# Patient Record
Sex: Male | Born: 1945
Health system: Southern US, Community
[De-identification: ages and names within clinical notes are randomized; demographics above are authoritative.]

## PROBLEM LIST (undated history)

## (undated) DIAGNOSIS — Z87442 Personal history of urinary calculi: Secondary | ICD-10-CM

## (undated) DIAGNOSIS — Z9641 Presence of insulin pump (external) (internal): Secondary | ICD-10-CM

## (undated) DIAGNOSIS — K219 Gastro-esophageal reflux disease without esophagitis: Secondary | ICD-10-CM

## (undated) DIAGNOSIS — E162 Hypoglycemia, unspecified: Secondary | ICD-10-CM

## (undated) DIAGNOSIS — I219 Acute myocardial infarction, unspecified: Secondary | ICD-10-CM

## (undated) DIAGNOSIS — K5909 Other constipation: Secondary | ICD-10-CM

## (undated) DIAGNOSIS — Z85828 Personal history of other malignant neoplasm of skin: Secondary | ICD-10-CM

## (undated) DIAGNOSIS — M1990 Unspecified osteoarthritis, unspecified site: Secondary | ICD-10-CM

## (undated) DIAGNOSIS — R609 Edema, unspecified: Secondary | ICD-10-CM

## (undated) DIAGNOSIS — I10 Essential (primary) hypertension: Secondary | ICD-10-CM

## (undated) DIAGNOSIS — R600 Localized edema: Secondary | ICD-10-CM

## (undated) DIAGNOSIS — N35919 Unspecified urethral stricture, male, unspecified site: Secondary | ICD-10-CM

## (undated) DIAGNOSIS — E133299 Other specified diabetes mellitus with mild nonproliferative diabetic retinopathy without macular edema, unspecified eye: Secondary | ICD-10-CM

## (undated) DIAGNOSIS — I2510 Atherosclerotic heart disease of native coronary artery without angina pectoris: Secondary | ICD-10-CM

## (undated) DIAGNOSIS — D649 Anemia, unspecified: Secondary | ICD-10-CM

## (undated) DIAGNOSIS — M549 Dorsalgia, unspecified: Secondary | ICD-10-CM

## (undated) DIAGNOSIS — I739 Peripheral vascular disease, unspecified: Secondary | ICD-10-CM

## (undated) DIAGNOSIS — E785 Hyperlipidemia, unspecified: Secondary | ICD-10-CM

## (undated) DIAGNOSIS — C801 Malignant (primary) neoplasm, unspecified: Secondary | ICD-10-CM

## (undated) DIAGNOSIS — Z8639 Personal history of other endocrine, nutritional and metabolic disease: Secondary | ICD-10-CM

## (undated) DIAGNOSIS — S56119A Strain of flexor muscle, fascia and tendon of finger of unspecified finger at forearm level, initial encounter: Secondary | ICD-10-CM

## (undated) DIAGNOSIS — I255 Ischemic cardiomyopathy: Secondary | ICD-10-CM

## (undated) DIAGNOSIS — E109 Type 1 diabetes mellitus without complications: Secondary | ICD-10-CM

## (undated) DIAGNOSIS — Z86718 Personal history of other venous thrombosis and embolism: Secondary | ICD-10-CM

## (undated) HISTORY — DX: Acute myocardial infarction, unspecified: I21.9

## (undated) HISTORY — DX: Hypoglycemia, unspecified: E16.2

## (undated) HISTORY — DX: Edema, unspecified: R60.9

## (undated) HISTORY — DX: Essential (primary) hypertension: I10

## (undated) HISTORY — DX: Ischemic cardiomyopathy: I25.5

## (undated) HISTORY — PX: CORONARY STENT PLACEMENT: SHX1402

## (undated) HISTORY — DX: Atherosclerotic heart disease of native coronary artery without angina pectoris: I25.10

## (undated) HISTORY — PX: SKIN CANCER EXCISION: SHX779

## (undated) HISTORY — DX: Unspecified osteoarthritis, unspecified site: M19.90

## (undated) HISTORY — PX: CORONARY ANGIOPLASTY: SHX604

## (undated) HISTORY — PX: CARDIAC CATHETERIZATION: SHX172

## (undated) HISTORY — DX: Malignant (primary) neoplasm, unspecified: C80.1

## (undated) HISTORY — DX: Hyperlipidemia, unspecified: E78.5

## (undated) SURGERY — LESION EXCISION WITH COMPLEX REPAIR
Anesthesia: Monitor Anesthesia Care | Laterality: Right

---

## 2005-08-11 ENCOUNTER — Emergency Department: Payer: Self-pay | Admitting: Emergency Medicine

## 2005-08-14 ENCOUNTER — Emergency Department: Payer: Self-pay | Admitting: Emergency Medicine

## 2005-08-19 ENCOUNTER — Emergency Department: Payer: Self-pay | Admitting: Internal Medicine

## 2005-08-22 ENCOUNTER — Emergency Department: Payer: Self-pay | Admitting: Emergency Medicine

## 2005-11-11 DIAGNOSIS — I219 Acute myocardial infarction, unspecified: Secondary | ICD-10-CM

## 2005-11-11 HISTORY — DX: Acute myocardial infarction, unspecified: I21.9

## 2005-12-17 ENCOUNTER — Encounter: Payer: Self-pay | Admitting: Cardiovascular Disease

## 2006-12-12 DIAGNOSIS — I255 Ischemic cardiomyopathy: Secondary | ICD-10-CM

## 2006-12-12 DIAGNOSIS — I252 Old myocardial infarction: Secondary | ICD-10-CM

## 2006-12-12 HISTORY — DX: Ischemic cardiomyopathy: I25.5

## 2006-12-12 HISTORY — DX: Old myocardial infarction: I25.2

## 2006-12-17 ENCOUNTER — Other Ambulatory Visit: Payer: Self-pay

## 2006-12-17 ENCOUNTER — Inpatient Hospital Stay: Payer: Self-pay | Admitting: *Deleted

## 2006-12-17 DIAGNOSIS — Z955 Presence of coronary angioplasty implant and graft: Secondary | ICD-10-CM

## 2006-12-17 HISTORY — DX: Presence of coronary angioplasty implant and graft: Z95.5

## 2006-12-17 HISTORY — PX: CORONARY ANGIOPLASTY WITH STENT PLACEMENT: SHX49

## 2006-12-19 ENCOUNTER — Other Ambulatory Visit: Payer: Self-pay

## 2007-02-14 ENCOUNTER — Inpatient Hospital Stay: Payer: Self-pay | Admitting: Internal Medicine

## 2007-02-14 ENCOUNTER — Other Ambulatory Visit: Payer: Self-pay

## 2007-03-04 ENCOUNTER — Encounter: Payer: Self-pay | Admitting: *Deleted

## 2007-07-22 ENCOUNTER — Encounter: Payer: Self-pay | Admitting: Cardiovascular Disease

## 2007-08-30 ENCOUNTER — Emergency Department: Payer: Self-pay | Admitting: Emergency Medicine

## 2007-09-11 ENCOUNTER — Emergency Department: Payer: Self-pay | Admitting: Emergency Medicine

## 2007-09-11 ENCOUNTER — Other Ambulatory Visit: Payer: Self-pay

## 2008-07-22 ENCOUNTER — Encounter: Payer: Self-pay | Admitting: Cardiovascular Disease

## 2009-06-02 ENCOUNTER — Encounter: Payer: Self-pay | Admitting: Cardiovascular Disease

## 2009-10-22 ENCOUNTER — Emergency Department: Payer: Self-pay | Admitting: Unknown Physician Specialty

## 2009-11-29 ENCOUNTER — Encounter: Payer: Self-pay | Admitting: Cardiovascular Disease

## 2010-05-11 ENCOUNTER — Observation Stay: Payer: Self-pay | Admitting: Internal Medicine

## 2010-12-20 ENCOUNTER — Telehealth: Payer: Self-pay | Admitting: Cardiovascular Disease

## 2010-12-25 ENCOUNTER — Encounter: Payer: Self-pay | Admitting: Cardiovascular Disease

## 2010-12-27 ENCOUNTER — Ambulatory Visit (INDEPENDENT_AMBULATORY_CARE_PROVIDER_SITE_OTHER): Payer: BC Managed Care – PPO | Admitting: Cardiovascular Disease

## 2010-12-27 ENCOUNTER — Encounter: Payer: Self-pay | Admitting: Cardiovascular Disease

## 2010-12-27 DIAGNOSIS — I2510 Atherosclerotic heart disease of native coronary artery without angina pectoris: Secondary | ICD-10-CM | POA: Insufficient documentation

## 2010-12-27 DIAGNOSIS — E119 Type 2 diabetes mellitus without complications: Secondary | ICD-10-CM | POA: Insufficient documentation

## 2010-12-27 DIAGNOSIS — I10 Essential (primary) hypertension: Secondary | ICD-10-CM

## 2010-12-27 DIAGNOSIS — E109 Type 1 diabetes mellitus without complications: Secondary | ICD-10-CM | POA: Insufficient documentation

## 2010-12-27 DIAGNOSIS — E785 Hyperlipidemia, unspecified: Secondary | ICD-10-CM | POA: Insufficient documentation

## 2010-12-27 DIAGNOSIS — R609 Edema, unspecified: Secondary | ICD-10-CM | POA: Insufficient documentation

## 2010-12-27 NOTE — Progress Notes (Signed)
Summary: RX  Phone Note Refill Request Call back at Home Phone [phone] Call back at [phone] Message from:  Patient on December 20, 2010 10:09 AM  Refills Requested: Medication #1:  [facility]  Initial call taken by: Harlon Flor,  December 20, 2010 10:10 AM  Follow-up for Phone Call        [facility]. Follow-up by: Lysbeth Galas CMA,  December 20, 2010 10:56 AM    New/Updated Medications: PLAVIX 75 MG TABS (CLOPIDOGREL BISULFATE) 1 tablet once daily Prescriptions: PLAVIX 75 MG TABS (CLOPIDOGREL BISULFATE) 1 tablet once daily  #30 x 0   Entered by:   Lysbeth Galas CMA   Authorized by:   Dossie Arbour MD   Signed by:   Lysbeth Galas CMA on 12/20/2010   Method used:   Electronically to        [facility] [phone].* (retail)       [address]       [place], [place]  [address]       Ph: [phone]       Fax: [phone]   RxID:   [id number]

## 2011-01-02 NOTE — Assessment & Plan Note (Signed)
Summary: [facility]   Visit Type:  Initial Consult Primary Provider:  Dr. Delia Chimes  CC:  "doing well" denies chest pain and SOB.Marland Kitchen  History of Present Illness: [name] is a very pleasant 65 year old gentleman with a history of coronary artery disease, occluded LAD with stent placed February 2008 ( Cypher 3.0 x 8 mm DES stent), also with poorly controlled diabetes, hypertension who presents to reestablish care. He was last seen by myself at [facility] in January 2011.  He reports that overall he is doing well. He presents today for refill of his medications. He is active on a horse farm. He is thinking of going back to work part-time. He denies any chest pain or shortness of breath with exertion. His previous angina was described as posterior neck pain. He denies any neck pain with exertion.  He has been working with Dr. Hyacinth Meeker on his diabetes and reports a hemoglobin A1c of 8 which is improved from 10 in February of 2010. He does report having 2 episodes of hypoglycemia overnight that scared him and has been reluctant to have his sugars have been aggressively low level.  EKG shows normal sinus rhythm with rate 68 beats per minute old anterior infarct, left axis deviation  Cardiac catheter report from 2008 details 25% ostial left main, 100% mid LAD with stent placed at this location,40% proximal circumflex, 20% PL vessel disease  Preventive Screening-Counseling & Management  Alcohol-Tobacco     Smoking Status: never  Caffeine-Diet-Exercise     Does Patient Exercise: yes      Drug Use:  no.    Current Medications (verified): 1)  Plavix 75 Mg Tabs (Clopidogrel Bisulfate) .Marland Kitchen.. 1 Tablet Once Daily 2)  Ecotrin 325 Mg Tbec (Aspirin) .Marland Kitchen.. 1 Tablet Once Daily 3)  Coreg 25 Mg Tabs (Carvedilol) .Marland Kitchen.. 1 Tablet Two Times A Day 4)  Lipitor 80 Mg Tabs (Atorvastatin Calcium) .Marland Kitchen.. 1 Tablet At Bedtime 5)  Nitrostat 0.4 Mg Subl (Nitroglycerin) .... As Needed  For Chest Pains 6)  Furosemide 20 Mg Tabs (Furosemide) .Marland Kitchen.. 1 Tablet Once Daily 7)  Lantus 100 Unit/ml Soln (Insulin Glargine) .Marland Kitchen.. 15 U Two Times A Day As Needed 8)  Novolog 100 Unit/ml Soln (Insulin Aspart) .... 5 U Two Times A Day As Needed 9)  Multivitamins  Tabs (Multiple Vitamin) .Marland Kitchen.. 1 Tablet Once Daily 10)  Vitamin B-12 1000 Mcg Tabs (Cyanocobalamin) .Marland Kitchen.. 1 Tablet Once Daily 11)  Losartan Potassium 50 Mg Tabs (Losartan Potassium) .Marland Kitchen.. 1 Tablet Once Daily  Allergies (verified): No Known Drug Allergies  Past History:  Past Medical History: Last updated: 12/26/2010 CAD h/o ischemic cardiomyopathy Diabetes Hyperlipidemia Hypertension h/o MI  Past Surgical History: Last updated: 12/26/2010 Cardiac cath-stent placed to an occluded LAD  Family History: Last updated: 12/28/10 Father:deceased-heart disease Mother:Heart Disease-59-deceased-MI  Social History: Last updated: 2010-12-28 Single  Tobacco Use - No.  Alcohol Use - no Regular Exercise - yes Drug Use - no Retired   Risk Factors: Exercise: yes (December 28, 2010)  Risk Factors: Smoking Status: never (Dec 28, 2010)  Family History: Father:deceased-heart disease Mother:Heart Disease-59-deceased-MI  Social History: Single  Tobacco Use - No.  Alcohol Use - no Regular Exercise - yes Drug Use - no Retired  Smoking Status:  never Does Patient Exercise:  yes Drug Use:  no  Review of Systems  The patient denies fever, weight loss, weight gain, vision loss, decreased hearing, hoarseness, chest pain, syncope, dyspnea on exertion, peripheral edema, prolonged cough, abdominal pain, incontinence, muscle weakness, depression,  and enlarged lymph nodes.    Vital Signs:  Patient profile:   65 year old male Height:      69 inches Weight:      190.50 pounds BMI:     28.23 Pulse rate:   68 / minute BP sitting:   135 / 81  (left arm) Cuff size:   regular  Vitals Entered By: Lysbeth Galas CMA (December 27, 2010 11:23  AM)  Physical Exam  General:  Well developed, well nourished, in no acute distress. Head:  normocephalic and atraumatic Neck:  Neck supple, no JVD. No masses, thyromegaly or abnormal cervical nodes. Lungs:  Clear bilaterally to auscultation and percussion. Heart:  Non-displaced PMI, chest non-tender; regular rate and rhythm, S1, S2 without murmurs, rubs or gallops. Carotid upstroke normal, no bruit. Normal abdominal aortic size, no bruits.  Pedals normal pulses. 1+ edema b/l LE, no varicosities. Abdomen:  Bowel sounds positive; abdomen soft and non-tender without masses Msk:  Back normal, normal gait. Muscle strength and tone normal. Pulses:  pulses normal in all 4 extremities Extremities:  No clubbing or cyanosis. Neurologic:  Alert and oriented x 3. Skin:  Intact without lesions or rashes. Psych:  Normal affect.   Impression & Recommendations:  Problem # 1:  CAD, NATIVE VESSEL (ICD-414.01) known CAD with stent placed to his mid LAD in 2008. No symptoms of angina at this time. Previous negative stress test in 2009. Continue aggressive medical management. No further testing ordered.  His updated medication list for this problem includes:    Plavix 75 Mg Tabs (Clopidogrel bisulfate) .Marland Kitchen... 1 tablet once daily    Ecotrin 325 Mg Tbec (Aspirin) .Marland Kitchen... 1 tablet once daily    Coreg 25 Mg Tabs (Carvedilol) .Marland Kitchen... 1 tablet two times a day    Nitrostat 0.4 Mg Subl (Nitroglycerin) .Marland Kitchen... As needed for chest pains  Problem # 2:  HYPERLIPIDEMIA-MIXED (ICD-272.4) We will have him check his labs in the next week or so. Goal LDL is less than 70.  His updated medication list for this problem includes:    Lipitor 80 Mg Tabs (Atorvastatin calcium) .Marland Kitchen... 1 tablet at bedtime  Problem # 3:  DM (ICD-250.00) We had a long discussion with him about his diabetes. It has improved though continues to be far from perfect. He will continue to work with Dr. Hyacinth Meeker on optimizing his sugar control. At his  request, we have ordered a hemoglobin A1c to be done this week which we will send to Dr. Hyacinth Meeker.  His updated medication list for this problem includes:    Ecotrin 325 Mg Tbec (Aspirin) .Marland Kitchen... 1 tablet once daily    Lantus 100 Unit/ml Soln (Insulin glargine) .Marland KitchenMarland KitchenMarland KitchenMarland Kitchen 15 u two times a day as needed    Novolog 100 Unit/ml Soln (Insulin aspart) .Marland KitchenMarland KitchenMarland KitchenMarland Kitchen 5 u two times a day as needed    Losartan Potassium 50 Mg Tabs (Losartan potassium) .Marland Kitchen... 1 tablet once daily  Problem # 4:  HYPERTENSION, BENIGN (ICD-401.1) Blood pressure is relatively well controlled on his current medication regimen.  His updated medication list for this problem includes:    Ecotrin 325 Mg Tbec (Aspirin) .Marland Kitchen... 1 tablet once daily    Coreg 25 Mg Tabs (Carvedilol) .Marland Kitchen... 1 tablet two times a day    Furosemide 20 Mg Tabs (Furosemide) .Marland Kitchen... 1 tablet once daily    Losartan Potassium 50 Mg Tabs (Losartan potassium) .Marland Kitchen... 1 tablet once daily  Problem # 5:  EDEMA (ICD-782.3) He does have edema in his lower  extremity is bilaterally. This is likely venous insufficiency as he has been sitting at a computer for long periods at a time. It is not particularly pitting consistent with CHF. He does take Lasix daily. He also admits to recent salt loading.  Other Orders: EKG w/ Interpretation (93000)  Patient Instructions: 1)  Your physician recommends that you schedule a follow-up appointment in: 1 year 2)  Your physician recommends that you return for a FASTING lipid profile: (HgB A1c, BMP, Lipids, Lfts)You have an appt next Thursday 01/03/11 AM. 3)  Your physician recommends that you continue on your current medications as directed. Please refer to the Current Medication list given to you today. Prescriptions: LOSARTAN POTASSIUM 50 MG TABS (LOSARTAN POTASSIUM) 1 tablet once daily  #90 x 4   Entered by:   [name] Hurst RN   Authorized by:   Dossie Arbour MD   Signed by:   [name] Hurst RN on 12/27/2010   Method used:   Electronically to         [facility] [address] [phone].* (retail)       [address]       [place], [place]  [address]       Ph: [phone]       Fax: [phone]   RxID:   [id number] FUROSEMIDE 20 MG TABS (FUROSEMIDE) 1 tablet once daily  #90 x 4   Entered by:   [name] Hurst RN   Authorized by:   Dossie Arbour MD   Signed by:   [name] Hurst RN on 12/27/2010   Method used:   Electronically to        [facility] [address] [phone].* (retail)       [address]       [place], [place]  [address]       Ph: [phone]       Fax: [phone]   RxID:   [id number] LIPITOR 80 MG TABS (ATORVASTATIN CALCIUM) 1 tablet at bedtime  #90 x 4   Entered by:   [name] Hurst RN   Authorized by:   Dossie Arbour MD   Signed by:   [name] Hurst RN on 12/27/2010   Method used:   Electronically to        [facility] [address] [phone].* (retail)       [address]       [place], [place]  [address]       Ph: [phone]       Fax: [phone]   RxID:   [id number] COREG 25 MG TABS (CARVEDILOL) 1 tablet two times a day  #180 x 4   Entered by:   [name] Hurst RN   Authorized by:   Dossie Arbour MD   Signed by:   [name] Hurst RN on 12/27/2010   Method used:   Electronically to        [facility] [address] [phone].* (retail)       [address]       [place], [place]  [address]       Ph: [phone]       Fax: [phone]   RxID:   [id number] PLAVIX 75 MG TABS (CLOPIDOGREL BISULFATE) 1 tablet once daily  #90 x 4   Entered  by:   [name] Hurst RN   Authorized by:   Dossie Arbour MD   Signed by:   [name] Hurst RN on 12/27/2010   Method used:   Electronically to        [facility] [address] [phone].* (retail)       [address]       [place], [place]  [address]       Ph: [phone]       Fax: [phone]   RxID:   [id number]

## 2011-01-03 ENCOUNTER — Other Ambulatory Visit (INDEPENDENT_AMBULATORY_CARE_PROVIDER_SITE_OTHER): Payer: BC Managed Care – PPO

## 2011-01-03 ENCOUNTER — Encounter: Payer: Self-pay | Admitting: Cardiovascular Disease

## 2011-01-03 DIAGNOSIS — E785 Hyperlipidemia, unspecified: Secondary | ICD-10-CM

## 2011-01-03 DIAGNOSIS — E119 Type 2 diabetes mellitus without complications: Secondary | ICD-10-CM

## 2011-01-03 DIAGNOSIS — Z79899 Other long term (current) drug therapy: Secondary | ICD-10-CM

## 2011-01-05 LAB — CONVERTED CEMR LAB
AST: 22 units/L (ref 0–37)
Albumin: 3.7 g/dL (ref 3.5–5.2)
CO2: 27 meq/L (ref 19–32)
Calcium: 9.1 mg/dL (ref 8.4–10.5)
Chloride: 104 meq/L (ref 96–112)
HDL: 53 mg/dL (ref >39)
Hgb A1c MFr Bld: 8.7 % — ABNORMAL HIGH (ref <5.7)
Sodium: 140 meq/L (ref 135–145)
Total Bilirubin: 0.7 mg/dL (ref 0.3–1.2)
Total CHOL/HDL Ratio: 2.8

## 2011-01-22 NOTE — Letter (Signed)
Summary: [facility] 2-D Echo  [facility] 2-D Echo   Imported By: Roderic Ovens 01/18/2011 11:03:06  _____________________________________________________________________  External Attachment:    Type:   Image     Comment:   External Document

## 2011-01-22 NOTE — Letter (Signed)
SummaryScientist, [facility] Cath Report   [facility] Cath Report   Imported By: Roderic Ovens 01/17/2011 15:45:03  _____________________________________________________________________  External Attachment:    Type:   Image     Comment:   External Document

## 2011-01-22 NOTE — Letter (Signed)
Summary: [facility] Office Note   [facility] Office Note   Imported By: Roderic Ovens 01/18/2011 11:05:16  _____________________________________________________________________  External Attachment:    Type:   Image     Comment:   External Document

## 2011-01-22 NOTE — Letter (Signed)
Summary: [facility] Office Note   [facility] Office Note   Imported By: Roderic Ovens 01/18/2011 11:04:53  _____________________________________________________________________  External Attachment:    Type:   Image     Comment:   External Document

## 2011-05-30 ENCOUNTER — Encounter: Payer: Self-pay | Admitting: Cardiovascular Disease

## 2011-12-30 ENCOUNTER — Other Ambulatory Visit: Payer: Self-pay | Admitting: *Deleted

## 2011-12-30 MED ORDER — ATORVASTATIN CALCIUM 80 MG PO TABS: 80.0000 mg | ORAL_TABLET | Freq: Every day | ORAL | Status: DC

## 2011-12-30 MED ORDER — CARVEDILOL 25 MG PO TABS: 25.0000 mg | ORAL_TABLET | Freq: Two times a day (BID) | ORAL | Status: DC

## 2011-12-30 MED ORDER — LOSARTAN POTASSIUM 50 MG PO TABS: 50.0000 mg | ORAL_TABLET | Freq: Every day | ORAL | Status: DC

## 2011-12-30 MED ORDER — CLOPIDOGREL BISULFATE 75 MG PO TABS: 75.0000 mg | ORAL_TABLET | Freq: Every day | ORAL | Status: DC

## 2011-12-30 MED ORDER — FUROSEMIDE 20 MG PO TABS: 20.0000 mg | ORAL_TABLET | Freq: Every day | ORAL | Status: DC

## 2012-01-01 ENCOUNTER — Encounter: Payer: Self-pay | Admitting: *Deleted

## 2012-01-02 ENCOUNTER — Ambulatory Visit (INDEPENDENT_AMBULATORY_CARE_PROVIDER_SITE_OTHER): Payer: PRIVATE HEALTH INSURANCE | Admitting: Cardiovascular Disease

## 2012-01-02 ENCOUNTER — Encounter: Payer: Self-pay | Admitting: Cardiovascular Disease

## 2012-01-02 VITALS — BP 110/62 | HR 76 | Ht 69.0 in | Wt 199.0 lb

## 2012-01-02 DIAGNOSIS — E785 Hyperlipidemia, unspecified: Secondary | ICD-10-CM

## 2012-01-02 DIAGNOSIS — I10 Essential (primary) hypertension: Secondary | ICD-10-CM

## 2012-01-02 DIAGNOSIS — I2510 Atherosclerotic heart disease of native coronary artery without angina pectoris: Secondary | ICD-10-CM

## 2012-01-02 DIAGNOSIS — E119 Type 2 diabetes mellitus without complications: Secondary | ICD-10-CM

## 2012-01-02 DIAGNOSIS — R61 Generalized hyperhidrosis: Secondary | ICD-10-CM

## 2012-01-02 NOTE — Patient Instructions (Addendum)
Please hold the losartan and lipitor to see if this will help the night sweats.  Please call us if you have new issues that need to be addressed before your next appt.  Your physician wants you to follow-up in: 6 months.  You will receive a reminder letter in the mail two months in advance. If you don't receive a letter, please call our office to schedule the follow-up appointment.

## 2012-01-02 NOTE — Assessment & Plan Note (Signed)
Currently with no symptoms of angina. No further workup at this time. Continue current medication regimen. 

## 2012-01-02 NOTE — Assessment & Plan Note (Signed)
Followed by Dr. Burnett Sheng and Silver Huguenin.

## 2012-01-02 NOTE — Assessment & Plan Note (Signed)
Goal total cholesterol less than 150, LDL less than 70 

## 2012-01-02 NOTE — Progress Notes (Signed)
Patient ID: [name], male    DOB: June 11, 1946, 66 y.o.   MRN: [id number]  HPI Comments: [name] is a very pleasant 66 year old gentleman with a history of coronary artery disease, occluded LAD with stent placed February 2008 ( Cypher 3.0 x 8 mm DES stent), also with poorly controlled diabetes, hypertension who presents for routine followup.     He is active on a horse farm.  He denies any chest pain or shortness of breath with exertion. His previous angina was described as posterior neck pain. He denies any neck pain with exertion. He has been having sweating at nighttime. It wakes him up at 3:00 in the morning and he is soaked in sweat. He has developed a rash across his anterior trunk him a little on his back which she feels could be secondary to the sweat. During the daytime he feels well and is active with no complaints. He wonders if the sweating could be secondary to the medications. He did start the generic Lipitor approximately 6 weeks ago when his symptoms started. He did have his knee injected with steroid 4 weeks ago but symptoms of sweating started prior to this.   EKG shows normal sinus rhythm with rate 76 beats per minute old anterior infarct, left axis deviation   Cardiac catheter report from 2008 details 25% ostial left main, 100% mid LAD with stent placed at this location,40% proximal circumflex, 20% PL vessel disease    Outpatient Encounter Prescriptions as of 01/02/2012  Medication Sig Dispense Refill  . aspirin 325 MG EC tablet Take 325 mg by mouth daily.        Marland Kitchen atorvastatin (LIPITOR) 80 MG tablet Take 1 tablet (80 mg total) by mouth at bedtime.  90 tablet  1  . carvedilol (COREG) 25 MG tablet Take 1 tablet (25 mg total) by mouth 2 (two) times daily.  180 tablet  1  . clopidogrel (PLAVIX) 75 MG tablet Take 1 tablet (75 mg total) by mouth daily.  90 tablet  1  . furosemide (LASIX) 20 MG tablet Take 1 tablet (20 mg total) by mouth daily.  90 tablet  1  .  insulin aspart (NOVOLOG) 100 UNIT/ML injection Inject 5 Units into the skin 2 (two) times daily as needed.        . insulin glargine (LANTUS) 100 UNIT/ML injection Inject 15 Units into the skin 2 (two) times daily as needed.        Marland Kitchen losartan (COZAAR) 50 MG tablet Take 1 tablet (50 mg total) by mouth daily.  90 tablet  1  . multivitamin (THERAGRAN) per tablet Take 1 tablet by mouth daily.        . nitroGLYCERIN (NITROSTAT) 0.4 MG SL tablet Place 0.4 mg under the tongue every 5 (five) minutes as needed. Do not exceed 3 doses in 15 minutes.       . vitamin B-12 (CYANOCOBALAMIN) 1000 MCG tablet Take 1,000 mcg by mouth daily.           Review of Systems  Constitutional: Negative.        Diaphoresis/sweating in the middle of the night  HENT: Negative.   Eyes: Negative.   Respiratory: Negative.   Cardiovascular: Negative.   Gastrointestinal: Negative.   Musculoskeletal: Negative.   Skin: Negative.   Neurological: Negative.   Hematological: Negative.   Psychiatric/Behavioral: Negative.   All other systems reviewed and are negative.    BP 110/62  Pulse 76  Ht 5\' 9"  (1.753 m)  Wt 199 lb (90.266 kg)  BMI 29.39 kg/m2   Physical Exam  Nursing note and vitals reviewed. Constitutional: He is oriented to person, place, and time. He appears well-developed and well-nourished.  HENT:  Head: Normocephalic.  Nose: Nose normal.  Mouth/Throat: Oropharynx is clear and moist.  Eyes: Conjunctivae are normal. Pupils are equal, round, and reactive to light.  Neck: Normal range of motion. Neck supple. No JVD present.  Cardiovascular: Normal rate, regular rhythm, S1 normal, S2 normal, normal heart sounds and intact distal pulses.  Exam reveals no gallop and no friction rub.   No murmur heard. Pulmonary/Chest: Effort normal and breath sounds normal. No respiratory distress. He has no wheezes. He has no rales. He exhibits no tenderness.  Abdominal: Soft. Bowel sounds are normal. He exhibits no  distension. There is no tenderness.  Musculoskeletal: Normal range of motion. He exhibits no edema and no tenderness.  Lymphadenopathy:    He has no cervical adenopathy.  Neurological: He is alert and oriented to person, place, and time. Coordination normal.  Skin: Skin is warm and dry. No rash noted. No erythema.  Psychiatric: He has a normal mood and affect. His behavior is normal. Judgment and thought content normal.           Assessment and Plan

## 2012-01-02 NOTE — Assessment & Plan Note (Signed)
He does report having low blood pressure at home. Given his recent diaphoresis in the middle of the night, we have suggested he hold his losartan to see if this helps his symptoms.

## 2012-01-02 NOTE — Assessment & Plan Note (Signed)
Etiology of his diaphoresis at nighttime while he is sleeping is uncertain. He denies having low blood glucose. He is concerned about low blood pressure. We will hold his losartan for now. He is also concerned about his new generic cholesterol medication. We have suggested he hold the atrial and a statin to see if this helps his symptoms. He'll followup with his primary care physician for other lab work. Uncertain if he needs a TSH, testosterone check, possibly even cortisol. I will defer to Dr. Burnett Sheng.

## 2012-07-01 ENCOUNTER — Ambulatory Visit (INDEPENDENT_AMBULATORY_CARE_PROVIDER_SITE_OTHER): Payer: PRIVATE HEALTH INSURANCE | Admitting: Cardiovascular Disease

## 2012-07-01 ENCOUNTER — Encounter: Payer: Self-pay | Admitting: Cardiovascular Disease

## 2012-07-01 VITALS — BP 100/64 | HR 78 | Ht 69.0 in | Wt 191.5 lb

## 2012-07-01 DIAGNOSIS — E785 Hyperlipidemia, unspecified: Secondary | ICD-10-CM

## 2012-07-01 DIAGNOSIS — I2510 Atherosclerotic heart disease of native coronary artery without angina pectoris: Secondary | ICD-10-CM

## 2012-07-01 DIAGNOSIS — E119 Type 2 diabetes mellitus without complications: Secondary | ICD-10-CM

## 2012-07-01 DIAGNOSIS — I10 Essential (primary) hypertension: Secondary | ICD-10-CM

## 2012-07-01 DIAGNOSIS — R61 Generalized hyperhidrosis: Secondary | ICD-10-CM

## 2012-07-01 NOTE — Patient Instructions (Addendum)
You are doing well. No medication changes were made.  Please call us if you have new issues that need to be addressed before your next appt.  Your physician wants you to follow-up in: 12 months.  You will receive a reminder letter in the mail two months in advance. If you don't receive a letter, please call our office to schedule the follow-up appointment. 

## 2012-07-01 NOTE — Assessment & Plan Note (Signed)
Diabetes is followed by Dr. Rockie Neighbours

## 2012-07-01 NOTE — Assessment & Plan Note (Signed)
Blood pressure is well controlled on today's visit. No changes made to the medications. 

## 2012-07-01 NOTE — Assessment & Plan Note (Signed)
Cholesterol is at goal on the current lipid regimen. No changes to the medications were made.  

## 2012-07-01 NOTE — Assessment & Plan Note (Signed)
Night sweats have resolved as he has done his dental work indicating possible low-grade infection.

## 2012-07-01 NOTE — Assessment & Plan Note (Signed)
Currently with no symptoms of angina. No further workup at this time. Continue current medication regimen. 

## 2012-07-01 NOTE — Progress Notes (Signed)
Patient ID: [name], male    DOB: [birth year], 66 y.o.   MRN: [id number]  HPI Comments: [name] is a very pleasant 66 year old gentleman with a history of coronary artery disease, occluded LAD with stent placed February 2008 ( Cypher 3.0 x 8 mm DES stent), also with poorly controlled diabetes, hypertension who presents for routine followup.     He is active on a horse farm.  He denies any chest pain or shortness of breath with exertion. His previous angina was described as posterior neck pain. He denies any neck pain with exertion. He does report having some tingling down his left arm.   On his last clinic visit he reported having night sweats. We held his carvedilol and Lipitor. Night sweats did not improve. He restarted the medications.  He has since been seeing a dentist and was told he had infection in his gums and has had significant work done. He feels the night sweats have improved as his dental situation has improved.  He does report an episode where his blood sugar dropped. 911 was called and sugar level was found to be 20. In followup with Dr. Hyacinth Meeker, his insulin level was decreased.    EKG shows normal sinus rhythm with rate 78 beats per minute old anterior infarct   Cardiac catheter report from 2008 details 25% ostial left main, 100% mid LAD with stent placed at this location,40% proximal circumflex, 20% PL vessel disease  Previous stress test showing scar in the mid to distal anterior wall.    Outpatient Encounter Prescriptions as of 07/01/2012  Medication Sig Dispense Refill  . aspirin 325 MG EC tablet Take 325 mg by mouth daily.        Marland Kitchen atorvastatin (LIPITOR) 80 MG tablet Take 1 tablet (80 mg total) by mouth at bedtime.  90 tablet  1  . carvedilol (COREG) 25 MG tablet Take 1 tablet (25 mg total) by mouth 2 (two) times daily.  180 tablet  1  . clopidogrel (PLAVIX) 75 MG tablet Take 1 tablet (75 mg total) by mouth daily.  90 tablet  1  . furosemide (LASIX) 20  MG tablet Take 1 tablet (20 mg total) by mouth daily.  90 tablet  1  . insulin aspart (NOVOLOG) 100 UNIT/ML injection Inject 2 Units into the skin 2 (two) times daily as needed.       . insulin glargine (LANTUS) 100 UNIT/ML injection Takes 10 units am 14 units pm.      . losartan (COZAAR) 50 MG tablet Take 1 tablet (50 mg total) by mouth daily.  90 tablet  1  . multivitamin (THERAGRAN) per tablet Take 1 tablet by mouth daily.        . nitroGLYCERIN (NITROSTAT) 0.4 MG SL tablet Place 0.4 mg under the tongue every 5 (five) minutes as needed. Do not exceed 3 doses in 15 minutes.       . vitamin B-12 (CYANOCOBALAMIN) 1000 MCG tablet Take 1,000 mcg by mouth daily.           Review of Systems  Constitutional: Negative.   HENT: Negative.   Eyes: Negative.   Respiratory: Negative.   Cardiovascular: Negative.   Gastrointestinal: Negative.   Musculoskeletal: Negative.        Left arm tingling  Skin: Negative.   Neurological: Negative.   Hematological: Negative.   Psychiatric/Behavioral: Negative.   All other systems reviewed and are negative.    BP 100/64  Pulse 78  Ht 5\' 9"  (  1.753 m)  Wt 191 lb 8 oz (86.864 kg)  BMI 28.28 kg/m2  Physical Exam  Nursing note and vitals reviewed. Constitutional: He is oriented to person, place, and time. He appears well-developed and well-nourished.  HENT:  Head: Normocephalic.  Nose: Nose normal.  Mouth/Throat: Oropharynx is clear and moist.  Eyes: Conjunctivae are normal. Pupils are equal, round, and reactive to light.  Neck: Normal range of motion. Neck supple. No JVD present.  Cardiovascular: Normal rate, regular rhythm, S1 normal, S2 normal, normal heart sounds and intact distal pulses.  Exam reveals no gallop and no friction rub.   No murmur heard. Pulmonary/Chest: Effort normal and breath sounds normal. No respiratory distress. He has no wheezes. He has no rales. He exhibits no tenderness.  Abdominal: Soft. Bowel sounds are normal. He exhibits  no distension. There is no tenderness.  Musculoskeletal: Normal range of motion. He exhibits no edema and no tenderness.  Lymphadenopathy:    He has no cervical adenopathy.  Neurological: He is alert and oriented to person, place, and time. Coordination normal.  Skin: Skin is warm and dry. No rash noted. No erythema.  Psychiatric: He has a normal mood and affect. His behavior is normal. Judgment and thought content normal.           Assessment and Plan

## 2012-07-05 ENCOUNTER — Other Ambulatory Visit: Payer: Self-pay | Admitting: Cardiovascular Disease

## 2012-07-06 ENCOUNTER — Other Ambulatory Visit: Payer: Self-pay | Admitting: *Deleted

## 2012-07-06 MED ORDER — CARVEDILOL 25 MG PO TABS: 25.0000 mg | ORAL_TABLET | Freq: Two times a day (BID) | ORAL | Status: DC

## 2012-07-06 MED ORDER — CLOPIDOGREL BISULFATE 75 MG PO TABS: 75.0000 mg | ORAL_TABLET | Freq: Every day | ORAL | Status: DC

## 2012-07-06 MED ORDER — FUROSEMIDE 20 MG PO TABS: 20.0000 mg | ORAL_TABLET | Freq: Every day | ORAL | Status: DC

## 2012-07-06 NOTE — Telephone Encounter (Signed)
Refilled Lasix, Plavix and Coreg.

## 2012-09-07 ENCOUNTER — Other Ambulatory Visit: Payer: Self-pay | Admitting: Cardiovascular Disease

## 2013-01-12 ENCOUNTER — Other Ambulatory Visit: Payer: Self-pay | Admitting: Cardiovascular Disease

## 2013-01-14 ENCOUNTER — Other Ambulatory Visit: Payer: Self-pay | Admitting: Cardiovascular Disease

## 2013-04-24 ENCOUNTER — Other Ambulatory Visit: Payer: Self-pay | Admitting: Cardiovascular Disease

## 2013-04-26 ENCOUNTER — Other Ambulatory Visit: Payer: Self-pay | Admitting: *Deleted

## 2013-04-26 MED ORDER — ATORVASTATIN CALCIUM 80 MG PO TABS: ORAL_TABLET | ORAL | Status: DC

## 2013-04-26 NOTE — Telephone Encounter (Signed)
Refilled Atorvastatin sent to [facility] pharmacy. 

## 2013-06-27 ENCOUNTER — Other Ambulatory Visit: Payer: Self-pay | Admitting: Cardiovascular Disease

## 2013-07-02 ENCOUNTER — Encounter: Payer: Self-pay | Admitting: Cardiovascular Disease

## 2013-07-02 ENCOUNTER — Ambulatory Visit (INDEPENDENT_AMBULATORY_CARE_PROVIDER_SITE_OTHER): Payer: Medicare Other | Admitting: Cardiovascular Disease

## 2013-07-02 VITALS — BP 92/60 | HR 61 | Ht 69.0 in | Wt 193.5 lb

## 2013-07-02 DIAGNOSIS — I2510 Atherosclerotic heart disease of native coronary artery without angina pectoris: Secondary | ICD-10-CM

## 2013-07-02 DIAGNOSIS — R609 Edema, unspecified: Secondary | ICD-10-CM

## 2013-07-02 DIAGNOSIS — E785 Hyperlipidemia, unspecified: Secondary | ICD-10-CM

## 2013-07-02 DIAGNOSIS — E119 Type 2 diabetes mellitus without complications: Secondary | ICD-10-CM

## 2013-07-02 DIAGNOSIS — I10 Essential (primary) hypertension: Secondary | ICD-10-CM

## 2013-07-02 NOTE — Assessment & Plan Note (Signed)
Edema likely from lymphedema or venous insufficiency. Nonpitting, not consistent with CHF. Lasix likely will be of little benefit. May do better with compression hose and less Lasix.

## 2013-07-02 NOTE — Assessment & Plan Note (Signed)
Cholesterol is at goal on the current lipid regimen. No changes to the medications were made.  

## 2013-07-02 NOTE — Progress Notes (Signed)
Patient ID: [name], male    DOB: June 18, 1946, 67 y.o.   MRN: [id number]  HPI Comments: [name] is a very pleasant 67 year old gentleman with a history of coronary artery disease, occluded LAD with stent placed February 2008 ( Cypher 3.0 x 8 mm DES stent), also with poorly controlled diabetes, hypertension who presents for routine followup.     He is active on a horse farm.  He denies any chest pain or shortness of breath with exertion. His previous angina was described as posterior neck pain. He denies any neck pain with exertion. He continues to have tingling in his hands when he wakes up.  He does report having several episodes of dizziness, orthostatic in nature. Pressure has been running low. He has been holding his losartan. 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   On previous visits, he reported having night sweats. We held his carvedilol and Lipitor. Night sweats did not improve. He restarted the medications.  He has since been seeing a dentist and was told he had infection in his gums and has had significant work done. He feels the night sweats have improved as his dental situation has improved.  He does report an episode where his blood sugar dropped. 911 was called and sugar level was found to be 20. In followup with Dr. Hyacinth Meeker, his insulin level was decreased.    EKG shows normal sinus rhythm with rate 61 beats per minute old anterior infarct, T wave abnormality anterior leads   Cardiac catheter report from 2008 details 25% ostial left main, 100% mid LAD with stent placed at this location,40% proximal circumflex, 20% PL vessel disease Previous stress test showing scar in the mid to distal anterior wall. Hemoglobin A1c 8.2, total cholesterol 134, LDL 65    Outpatient Encounter Prescriptions as of 07/02/2013  Medication Sig Dispense Refill  . aspirin 325 MG EC tablet Take 325 mg by mouth daily.        Marland Kitchen atorvastatin (LIPITOR) 80 MG  tablet take 1 tablet by mouth at bedtime  30 tablet  6  . carvedilol (COREG) 25 MG tablet take 1 tablet by mouth twice a day  180 tablet  6  . clopidogrel (PLAVIX) 75 MG tablet take 1 tablet by mouth once daily  90 tablet  3  . furosemide (LASIX) 20 MG tablet Take 1 tablet (20 mg total) by mouth daily.  90 tablet  5  . insulin glargine (LANTUS) 100 UNIT/ML injection Takes 10 units am 14 units pm.      . insulin lispro protamine-lispro (HUMALOG 50/50) (50-50) 100 UNIT/ML SUSP injection Inject 4 Units into the skin 2 (two) times daily before a meal.      . losartan (COZAAR) 50 MG tablet       . multivitamin (THERAGRAN) per tablet Take 1 tablet by mouth daily.        . nitroGLYCERIN (NITROSTAT) 0.4 MG SL tablet Place 0.4 mg under the tongue every 5 (five) minutes as needed. Do not exceed 3 doses in 15 minutes.       . vitamin B-12 (CYANOCOBALAMIN) 1000 MCG tablet Take 1,000 mcg by mouth daily.        . [DISCONTINUED] losartan (COZAAR) 50 MG tablet take 1 tablet by mouth once daily  90 tablet  3  . [DISCONTINUED] carvedilol (COREG) 25 MG tablet Take 1 tablet (25 mg total) by mouth 2 (two) times daily.  180 tablet  5  . [  DISCONTINUED] clopidogrel (PLAVIX) 75 MG tablet Take 1 tablet (75 mg total) by mouth daily.  90 tablet  5  . [DISCONTINUED] insulin aspart (NOVOLOG) 100 UNIT/ML injection Inject 2 Units into the skin 2 (two) times daily as needed.        No facility-administered encounter medications on file as of 07/02/2013.     Review of Systems  Constitutional: Negative.   HENT: Negative.   Eyes: Negative.   Respiratory: Negative.   Cardiovascular: Negative.   Gastrointestinal: Negative.   Musculoskeletal: Negative.        Left arm tingling  Skin: Negative.   Neurological: Negative.   Psychiatric/Behavioral: Negative.   All other systems reviewed and are negative.    BP 92/60  Pulse 61  Ht 5\' 9"  (1.753 m)  Wt 193 lb 8 oz (87.771 kg)  BMI 28.56 kg/m2 Blood pressure recheck was 105  systolic Physical Exam  Nursing note and vitals reviewed. Constitutional: He is oriented to person, place, and time. He appears well-developed and well-nourished.  HENT:  Head: Normocephalic.  Nose: Nose normal.  Mouth/Throat: Oropharynx is clear and moist.  Eyes: Conjunctivae are normal. Pupils are equal, round, and reactive to light.  Neck: Normal range of motion. Neck supple. No JVD present.  Cardiovascular: Normal rate, regular rhythm, S1 normal, S2 normal, normal heart sounds and intact distal pulses.  Exam reveals no gallop and no friction rub.   No murmur heard. Pulmonary/Chest: Effort normal and breath sounds normal. No respiratory distress. He has no wheezes. He has no rales. He exhibits no tenderness.  Abdominal: Soft. Bowel sounds are normal. He exhibits no distension. There is no tenderness.  Musculoskeletal: Normal range of motion. He exhibits no edema and no tenderness.  Lymphadenopathy:    He has no cervical adenopathy.  Neurological: He is alert and oriented to person, place, and time. Coordination normal.  Skin: Skin is warm and dry. No rash noted. No erythema.  Psychiatric: He has a normal mood and affect. His behavior is normal. Judgment and thought content normal.      Assessment and Plan

## 2013-07-02 NOTE — Assessment & Plan Note (Signed)
Blood pressures running very low. Told him to hold his losartan as he has been doing. Take his Lasix every other day or as needed, increase his fluid intake. If he continues to run low, decrease the carvedilol in half down to 12.5 mg twice a day.

## 2013-07-02 NOTE — Assessment & Plan Note (Signed)
We have encouraged continued  careful diet management. May need more medications

## 2013-07-02 NOTE — Assessment & Plan Note (Signed)
Currently with no symptoms of angina. No further workup at this time. Continue current medication regimen. 

## 2013-07-02 NOTE — Patient Instructions (Addendum)
You are doing well. Hold the losartan  Cut the coreg in 1/2 twice a day, try a 12.5 mg twice a day  Try taking lasix every other day Drink more fluids  Please call us if you have new issues that need to be addressed before your next appt.  Your physician wants you to follow-up in: 12 months.  You will receive a reminder letter in the mail two months in advance. If you don't receive a letter, please call our office to schedule the follow-up appointment.

## 2013-08-23 ENCOUNTER — Telehealth: Payer: Self-pay | Admitting: *Deleted

## 2013-08-23 NOTE — Telephone Encounter (Signed)
Needs samples of Lipitor, Plavix and Atorvastation

## 2013-08-24 NOTE — Telephone Encounter (Signed)
Patient is made aware that we don't carry samples of Lipitor, Plavix and Atorvastatin. Pt mentioned that he is okay on refills at the moment.

## 2013-09-16 ENCOUNTER — Other Ambulatory Visit: Payer: Self-pay

## 2013-09-24 ENCOUNTER — Other Ambulatory Visit: Payer: Self-pay | Admitting: Cardiovascular Disease

## 2013-09-24 ENCOUNTER — Other Ambulatory Visit: Payer: Self-pay | Admitting: *Deleted

## 2013-09-24 MED ORDER — FUROSEMIDE 20 MG PO TABS: 20.0000 mg | ORAL_TABLET | Freq: Every day | ORAL | Status: DC

## 2013-09-24 NOTE — Telephone Encounter (Signed)
Requested Prescriptions   Signed Prescriptions Disp Refills  . furosemide (LASIX) 20 MG tablet 90 tablet 5    Sig: Take 1 tablet (20 mg total) by mouth daily.    Authorizing Provider: Antonieta Iba    Ordering User: Kendrick Fries

## 2013-12-07 ENCOUNTER — Other Ambulatory Visit: Payer: Self-pay | Admitting: Cardiovascular Disease

## 2013-12-07 ENCOUNTER — Other Ambulatory Visit: Payer: Self-pay | Admitting: *Deleted

## 2013-12-07 MED ORDER — ATORVASTATIN CALCIUM 80 MG PO TABS: ORAL_TABLET | ORAL | Status: DC

## 2013-12-07 NOTE — Telephone Encounter (Signed)
Requested Prescriptions   Signed Prescriptions Disp Refills  . atorvastatin (LIPITOR) 80 MG tablet 30 tablet 6    Sig: take 1 tablet by mouth at bedtime    Authorizing Provider: [name]    Ordering User: Britt Bottom

## 2013-12-22 ENCOUNTER — Other Ambulatory Visit: Payer: Self-pay | Admitting: Cardiovascular Disease

## 2014-08-26 ENCOUNTER — Encounter: Payer: Self-pay | Admitting: Cardiovascular Disease

## 2014-08-26 ENCOUNTER — Ambulatory Visit (INDEPENDENT_AMBULATORY_CARE_PROVIDER_SITE_OTHER): Payer: Commercial Managed Care - HMO | Admitting: Cardiovascular Disease

## 2014-08-26 VITALS — BP 92/60 | HR 64 | Ht 69.0 in | Wt 188.8 lb

## 2014-08-26 DIAGNOSIS — R609 Edema, unspecified: Secondary | ICD-10-CM

## 2014-08-26 DIAGNOSIS — I10 Essential (primary) hypertension: Secondary | ICD-10-CM

## 2014-08-26 DIAGNOSIS — E119 Type 2 diabetes mellitus without complications: Secondary | ICD-10-CM

## 2014-08-26 DIAGNOSIS — E785 Hyperlipidemia, unspecified: Secondary | ICD-10-CM

## 2014-08-26 DIAGNOSIS — I2510 Atherosclerotic heart disease of native coronary artery without angina pectoris: Secondary | ICD-10-CM

## 2014-08-26 MED ORDER — CARVEDILOL 12.5 MG PO TABS: 12.5000 mg | ORAL_TABLET | Freq: Two times a day (BID) | ORAL | Status: DC

## 2014-08-26 NOTE — Assessment & Plan Note (Signed)
Currently with no symptoms of angina. No further workup at this time. Continue current medication regimen. 

## 2014-08-26 NOTE — Assessment & Plan Note (Signed)
Cholesterol is at goal on the current lipid regimen. No changes to the medications were made.  

## 2014-08-26 NOTE — Assessment & Plan Note (Signed)
Blood pressures running low today, 92 systolic. Previously he held the losartan. Instead, we will decrease the Coreg down to 12.5 mg twice a day. If blood pressure continues to run low, dose could be decreased down to 6 mg twice a day. Encouraged him to increase his fluid intake

## 2014-08-26 NOTE — Patient Instructions (Addendum)
Your next appointment will be scheduled in our new office located at :  [facility]  [address] [place], [place] [address]   You are doing well. Please decrease the coreg in 1/2 twice a day  Consider decreasing the aspirin down to 81 mg x 2 with plavix (from 325)  Please call us if you have new issues that need to be addressed before your next appt.  Your physician wants you to follow-up in: 6 months.  You will receive a reminder letter in the mail two months in advance. If you don't receive a letter, please call our office to schedule the follow-up appointment.

## 2014-08-26 NOTE — Assessment & Plan Note (Signed)
Chronic minimal lower extremity edema. No change on today's visit. Likely venous insufficiency

## 2014-08-26 NOTE — Progress Notes (Signed)
Patient ID: [name], male    DOB: 1945/12/11, 68 y.o.   MRN: [id number]  HPI Comments: [name] is a very pleasant 68 year old gentleman with a history of coronary artery disease, occluded LAD with stent placed February 2008 ( Cypher 3.0 x 8 mm DES stent), also with poorly controlled diabetes, hypertension who presents for routine followup.     He is active on a horse farm.  He denies any chest pain or shortness of breath with exertion.  His previous angina was described as posterior neck pain. He denies any neck pain with exertion.  Blood pressure typically runs low. He was recently started on low-dose losartan 25 mg daily  Blood pressure today 92/60. He denies any dizziness or orthostasis. He does not drink much fluids He does report having dizziness previously HDL losartan on his own before  he continues on Coreg 25 mg twice a day  On previous visits, he reported having night sweats.  We held his carvedilol and Lipitor. Night sweats did not improve. told he had infection in his gums and has had significant work done.  night sweats have improved    EKG shows normal sinus rhythm with rate 64 beats per minute old anterior infarct, T wave abnormality anterior leads   Cardiac catheter report from 2008 details 25% ostial left main, 100% mid LAD with stent placed at this location,40% proximal circumflex, 20% PL vessel disease Previous stress test showing scar in the mid to distal anterior wall. Previous lab work; Hemoglobin A1c 8.2, total cholesterol 134, LDL 65    Outpatient Encounter Prescriptions as of 08/26/2014  Medication Sig  . aspirin 325 MG EC tablet Take 325 mg by mouth daily.    Marland Kitchen atorvastatin (LIPITOR) 80 MG tablet take 1 tablet by mouth at bedtime  . carvedilol (COREG) 25 MG tablet take 1 tablet by mouth twice a day  . clopidogrel (PLAVIX) 75 MG tablet take 1 tablet by mouth once daily  . furosemide (LASIX) 20 MG tablet Take 1 tablet every other day or as  needed.  . insulin glargine (LANTUS) 100 UNIT/ML injection Takes 10 units am 14 units pm.  . insulin lispro protamine-lispro (HUMALOG 50/50) (50-50) 100 UNIT/ML SUSP injection Inject 4 Units into the skin 2 (two) times daily before a meal.  . losartan (COZAAR) 50 MG tablet Take 25 mg by mouth daily.  . multivitamin (THERAGRAN) per tablet Take 1 tablet by mouth daily.    . nitroGLYCERIN (NITROSTAT) 0.4 MG SL tablet Place 0.4 mg under the tongue every 5 (five) minutes as needed. Do not exceed 3 doses in 15 minutes.   . vitamin B-12 (CYANOCOBALAMIN) 1000 MCG tablet Take 1,000 mcg by mouth daily.      Review of Systems  Constitutional: Negative.   HENT: Negative.   Eyes: Negative.   Respiratory: Negative.   Cardiovascular: Negative.   Gastrointestinal: Negative.   Endocrine: Negative.   Musculoskeletal: Negative.        Left arm tingling  Skin: Negative.   Allergic/Immunologic: Negative.   Neurological: Negative.   Hematological: Negative.   Psychiatric/Behavioral: Negative.   All other systems reviewed and are negative.   BP 92/60  Pulse 64  Ht 5\' 9"  (1.753 m)  Wt 188 lb 12 oz (85.616 kg)  BMI 27.86 kg/m2  Physical Exam  Nursing note and vitals reviewed. Constitutional: He is oriented to person, place, and time. He appears well-developed and well-nourished.  HENT:  Head: Normocephalic.  Nose: Nose normal.  Mouth/Throat: Oropharynx is clear and moist.  Eyes: Conjunctivae are normal. Pupils are equal, round, and reactive to light.  Neck: Normal range of motion. Neck supple. No JVD present.  Cardiovascular: Normal rate, regular rhythm, S1 normal, S2 normal, normal heart sounds and intact distal pulses.  Exam reveals no gallop and no friction rub.   No murmur heard. Pulmonary/Chest: Effort normal and breath sounds normal. No respiratory distress. He has no wheezes. He has no rales. He exhibits no tenderness.  Abdominal: Soft. Bowel sounds are normal. He exhibits no distension.  There is no tenderness.  Musculoskeletal: Normal range of motion. He exhibits no edema and no tenderness.  Lymphadenopathy:    He has no cervical adenopathy.  Neurological: He is alert and oriented to person, place, and time. Coordination normal.  Skin: Skin is warm and dry. No rash noted. No erythema.  Psychiatric: He has a normal mood and affect. His behavior is normal. Judgment and thought content normal.      Assessment and Plan

## 2014-08-26 NOTE — Assessment & Plan Note (Signed)
We have encouraged continued exercise, careful diet management in an effort to lose weight. 

## 2014-09-26 ENCOUNTER — Other Ambulatory Visit: Payer: Self-pay | Admitting: Cardiovascular Disease

## 2015-02-17 ENCOUNTER — Telehealth: Payer: Self-pay

## 2015-02-17 DIAGNOSIS — S46312A Strain of muscle, fascia and tendon of triceps, left arm, initial encounter: Secondary | ICD-10-CM | POA: Diagnosis not present

## 2015-02-17 NOTE — Telephone Encounter (Signed)
Left message for pt to call back  °

## 2015-02-17 NOTE — Telephone Encounter (Signed)
Pt states he has pain between left shoulder blade and under the muscle in his left arm, this is concerning to him. States is goes and comes, states it gets worse when he gets up. No SOB. Has an appt on 4/14. Please call.

## 2015-02-17 NOTE — Telephone Encounter (Signed)
Spoke w/ pt.  He reports pain in his left shoulder blade down to his elbow. Denies trauma, reports pain started after he "slept wrong" on the couch. Pt denies CP, SOB, n/v or diaphoresis, though he does report that he sweated a bit this am when it hurt.  Reports that the pain will subside when he gets comfortable in the bed at night, but will return for about an hour when he stands up.  Advised pt that sx are most likely musculoskeletal, but if sx become emergent to call 911 or proceed to ED. He verbalizes understanding and will keep appt w/ Dr. Rockey Situ on 4/14.

## 2015-02-23 ENCOUNTER — Ambulatory Visit (INDEPENDENT_AMBULATORY_CARE_PROVIDER_SITE_OTHER): Payer: Commercial Managed Care - HMO | Admitting: Cardiovascular Disease

## 2015-02-23 ENCOUNTER — Encounter: Payer: Self-pay | Admitting: Cardiovascular Disease

## 2015-02-23 VITALS — BP 130/60 | HR 70 | Ht 69.0 in | Wt 190.2 lb

## 2015-02-23 DIAGNOSIS — E785 Hyperlipidemia, unspecified: Secondary | ICD-10-CM

## 2015-02-23 DIAGNOSIS — R079 Chest pain, unspecified: Secondary | ICD-10-CM

## 2015-02-23 DIAGNOSIS — I10 Essential (primary) hypertension: Secondary | ICD-10-CM

## 2015-02-23 DIAGNOSIS — E119 Type 2 diabetes mellitus without complications: Secondary | ICD-10-CM

## 2015-02-23 DIAGNOSIS — I2510 Atherosclerotic heart disease of native coronary artery without angina pectoris: Secondary | ICD-10-CM | POA: Diagnosis not present

## 2015-02-23 DIAGNOSIS — M549 Dorsalgia, unspecified: Secondary | ICD-10-CM | POA: Insufficient documentation

## 2015-02-23 DIAGNOSIS — M546 Pain in thoracic spine: Secondary | ICD-10-CM

## 2015-02-23 NOTE — Assessment & Plan Note (Signed)
Cholesterol is at goal on the current lipid regimen. No changes to the medications were made.  

## 2015-02-23 NOTE — Assessment & Plan Note (Signed)
Blood pressure is well controlled on today's visit. No changes made to the medications. 

## 2015-02-23 NOTE — Patient Instructions (Signed)
You are doing well. No medication changes were made.  Please call us if you have new issues that need to be addressed before your next appt.  Your physician wants you to follow-up in: 12 months.  You will receive a reminder letter in the mail two months in advance. If you don't receive a letter, please call our office to schedule the follow-up appointment. 

## 2015-02-23 NOTE — Assessment & Plan Note (Signed)
Atypical back, left arm pain. Most likely musculoskeletal. Symptoms improved with prednisone taper. No further testing at this time. EKG relatively unchanged

## 2015-02-23 NOTE — Assessment & Plan Note (Signed)
We have encouraged continued exercise, careful diet management in an effort to lose weight. He is working with endocrine

## 2015-02-23 NOTE — Progress Notes (Signed)
Patient ID: [name], male    DOB: 07/23/1946, 69 y.o.   MRN: [id number]  HPI Comments: [name] is a very pleasant 69 year old gentleman with a history of coronary artery disease, occluded LAD with stent placed February 2008 ( Cypher 3.0 x 8 mm DES stent), also with poorly controlled diabetes, hypertension who presents for routine followup of new back pain   In follow-up today, he reports that he slept funny on his couch, possibly with his neck bent, woke up the next day with severe back pain radiating around to his left arm. He was seen by urgent care, started on prednisone taper. This has helped his symptoms, currently feels better though still with residual pain Interested in seeing a chiropractor Also reports having some tingling down his left arm, ulnar region. Otherwise has been active him a no other complaints. He sees endocrine through [facility] for his diabetes  EKG on today's visit showing normal sinus rhythm old anterior MI, T-wave abnormality, rate 70 bpm  Other past medical history  He is active on a horse farm.   His previous angina was described as posterior neck pain.  On previous visits, he reported having night sweats.  We held his carvedilol and Lipitor. Night sweats did not improve. told he had infection in his gums and has had significant work done.  night sweats have improved     Cardiac catheter report from 2008 details 25% ostial left main, 100% mid LAD with stent placed at this location,40% proximal circumflex, 20% PL vessel disease Previous stress test showing scar in the mid to distal anterior wall. Previous lab work; Hemoglobin A1c 8.2, total cholesterol 134, LDL 65    No Known Allergies  Outpatient Encounter Prescriptions as of 02/23/2015  Medication Sig  . aspirin 81 MG tablet Take 4 tablets (325 mg total) by mouth daily.  Marland Kitchen atorvastatin (LIPITOR) 80 MG tablet take 1 tablet by mouth at bedtime  . carvedilol (COREG) 12.5 MG tablet Take 1  tablet (12.5 mg total) by mouth 2 (two) times daily with a meal.  . clopidogrel (PLAVIX) 75 MG tablet take 1 tablet by mouth once daily  . furosemide (LASIX) 20 MG tablet take 1 tablet by mouth once daily  . insulin glargine (LANTUS) 100 UNIT/ML injection Inject 20 Units into the skin daily.   . insulin lispro protamine-lispro (HUMALOG 50/50) (50-50) 100 UNIT/ML SUSP injection Inject 4 Units into the skin 2 (two) times daily before a meal.  . losartan (COZAAR) 50 MG tablet Take 25 mg by mouth daily.  . multivitamin (THERAGRAN) per tablet Take 1 tablet by mouth daily.    . nitroGLYCERIN (NITROSTAT) 0.4 MG SL tablet Place 0.4 mg under the tongue every 5 (five) minutes as needed. Do not exceed 3 doses in 15 minutes.   . vitamin B-12 (CYANOCOBALAMIN) 1000 MCG tablet Take 1,000 mcg by mouth daily.    . [DISCONTINUED] aspirin 325 MG EC tablet Take 325 mg by mouth daily.      Past Medical History  Diagnosis Date  . Coronary artery disease   . Ischemic cardiomyopathy   . Diabetes mellitus   . Hyperlipidemia   . Hypertension   . Myocardial infarction   . Edema   . Hypoglycemia     history of episodes x2  . Arthritis     right knee  . Cancer     skin cancer    Past Surgical History  Procedure Laterality Date  . Cardiac catheterization    .  Coronary stent placement      To an occluded LAD  . Skin cancer excision      chest    Social History  reports that he has never smoked. He has never used smokeless tobacco. He reports that he does not drink alcohol or use illicit drugs.  Family History family history includes Heart attack in his mother; Heart disease in his father and mother.   Review of Systems  Constitutional: Negative.   Respiratory: Negative.   Cardiovascular: Negative.   Gastrointestinal: Negative.   Musculoskeletal: Positive for back pain.       Left arm tingling  Skin: Negative.   Neurological: Negative.   Hematological: Negative.   Psychiatric/Behavioral:  Negative.   All other systems reviewed and are negative.   BP 130/60 mmHg  Pulse 70  Ht 5\' 9"  (1.753 m)  Wt 190 lb 4 oz (86.297 kg)  BMI 28.08 kg/m2  Physical Exam  Constitutional: He is oriented to person, place, and time. He appears well-developed and well-nourished.  HENT:  Head: Normocephalic.  Nose: Nose normal.  Mouth/Throat: Oropharynx is clear and moist.  Eyes: Conjunctivae are normal. Pupils are equal, round, and reactive to light.  Neck: Normal range of motion. Neck supple. No JVD present.  Cardiovascular: Normal rate, regular rhythm, S1 normal, S2 normal, normal heart sounds and intact distal pulses.  Exam reveals no gallop and no friction rub.   No murmur heard. Pulmonary/Chest: Effort normal and breath sounds normal. No respiratory distress. He has no wheezes. He has no rales. He exhibits no tenderness.  Abdominal: Soft. Bowel sounds are normal. He exhibits no distension. There is no tenderness.  Musculoskeletal: Normal range of motion. He exhibits no edema or tenderness.  Lymphadenopathy:    He has no cervical adenopathy.  Neurological: He is alert and oriented to person, place, and time. Coordination normal.  Skin: Skin is warm and dry. No rash noted. No erythema.  Psychiatric: He has a normal mood and affect. His behavior is normal. Judgment and thought content normal.      Assessment and Plan   Nursing note and vitals reviewed.

## 2015-02-23 NOTE — Assessment & Plan Note (Signed)
Back pain symptoms radiating into his left arm most likely consistent with a radiculopathy, musculoskeletal etiology. Symptoms improving on prednisone taper. Phone number provided to chiropractic care if he so chooses.

## 2015-03-10 DIAGNOSIS — M9901 Segmental and somatic dysfunction of cervical region: Secondary | ICD-10-CM | POA: Diagnosis not present

## 2015-03-10 DIAGNOSIS — M791 Myalgia: Secondary | ICD-10-CM | POA: Diagnosis not present

## 2015-03-10 DIAGNOSIS — M5413 Radiculopathy, cervicothoracic region: Secondary | ICD-10-CM | POA: Diagnosis not present

## 2015-03-10 DIAGNOSIS — M542 Cervicalgia: Secondary | ICD-10-CM | POA: Diagnosis not present

## 2015-03-10 DIAGNOSIS — M5033 Other cervical disc degeneration, cervicothoracic region: Secondary | ICD-10-CM | POA: Diagnosis not present

## 2015-03-10 DIAGNOSIS — M4012 Other secondary kyphosis, cervical region: Secondary | ICD-10-CM | POA: Diagnosis not present

## 2015-03-29 DIAGNOSIS — J069 Acute upper respiratory infection, unspecified: Secondary | ICD-10-CM | POA: Diagnosis not present

## 2015-04-16 DIAGNOSIS — N10 Acute tubulo-interstitial nephritis: Secondary | ICD-10-CM | POA: Diagnosis not present

## 2015-05-06 DIAGNOSIS — N390 Urinary tract infection, site not specified: Secondary | ICD-10-CM | POA: Diagnosis not present

## 2015-05-12 DIAGNOSIS — Z0000 Encounter for general adult medical examination without abnormal findings: Secondary | ICD-10-CM | POA: Diagnosis not present

## 2015-05-12 DIAGNOSIS — E109 Type 1 diabetes mellitus without complications: Secondary | ICD-10-CM | POA: Diagnosis not present

## 2015-05-22 DIAGNOSIS — Z125 Encounter for screening for malignant neoplasm of prostate: Secondary | ICD-10-CM | POA: Diagnosis not present

## 2015-05-22 DIAGNOSIS — Z0000 Encounter for general adult medical examination without abnormal findings: Secondary | ICD-10-CM | POA: Diagnosis not present

## 2015-05-22 DIAGNOSIS — I10 Essential (primary) hypertension: Secondary | ICD-10-CM | POA: Diagnosis not present

## 2015-05-22 DIAGNOSIS — E109 Type 1 diabetes mellitus without complications: Secondary | ICD-10-CM | POA: Diagnosis not present

## 2015-05-22 DIAGNOSIS — Z87440 Personal history of urinary (tract) infections: Secondary | ICD-10-CM | POA: Diagnosis not present

## 2015-06-08 ENCOUNTER — Encounter: Payer: Self-pay | Admitting: Urology

## 2015-06-08 ENCOUNTER — Ambulatory Visit (INDEPENDENT_AMBULATORY_CARE_PROVIDER_SITE_OTHER): Payer: Commercial Managed Care - HMO | Admitting: Urology

## 2015-06-08 VITALS — BP 118/76 | HR 67 | Ht 69.0 in | Wt 197.6 lb

## 2015-06-08 DIAGNOSIS — R312 Other microscopic hematuria: Secondary | ICD-10-CM | POA: Diagnosis not present

## 2015-06-08 DIAGNOSIS — I2510 Atherosclerotic heart disease of native coronary artery without angina pectoris: Secondary | ICD-10-CM | POA: Insufficient documentation

## 2015-06-08 DIAGNOSIS — R339 Retention of urine, unspecified: Secondary | ICD-10-CM

## 2015-06-08 DIAGNOSIS — N390 Urinary tract infection, site not specified: Secondary | ICD-10-CM | POA: Diagnosis not present

## 2015-06-08 DIAGNOSIS — I10 Essential (primary) hypertension: Secondary | ICD-10-CM | POA: Insufficient documentation

## 2015-06-08 DIAGNOSIS — R972 Elevated prostate specific antigen [PSA]: Secondary | ICD-10-CM

## 2015-06-08 DIAGNOSIS — R3129 Other microscopic hematuria: Secondary | ICD-10-CM

## 2015-06-08 LAB — URINALYSIS, COMPLETE
Bilirubin, UA: NEGATIVE
Glucose, UA: NEGATIVE
KETONES UA: NEGATIVE
Nitrite, UA: NEGATIVE
PROTEIN UA: NEGATIVE
Specific Gravity, UA: 1.01 (ref 1.005–1.030)
UUROB: 0.2 mg/dL (ref 0.2–1.0)
pH, UA: 5.5 (ref 5.0–7.5)

## 2015-06-08 LAB — MICROSCOPIC EXAMINATION: Bacteria, UA: NONE SEEN

## 2015-06-08 LAB — BLADDER SCAN AMB NON-IMAGING: Scan Result: 123

## 2015-06-08 NOTE — Progress Notes (Signed)
06/08/2015 4:11 PM   [name] [birth year] [id number]  Referring provider: [name], MD [address] [place], [name] [address]  Chief Complaint  Patient presents with  . Recurrent UTI    w/ difficulty emptying bladder that subsided x 3-4 weeks ago w/ no complications now.     HPI: This 69 year old male comes in today for follow-up of voiding difficulty and urinary tract infection. In May of this year, he presented to an urgent care center with urinary frequency, urgency, fever to 101, dysuria, foul-smelling urine which was fairly sudden in onset. He was diagnosed with a urinary tract infection. He was treated with 10 days of Cipro. Shortly after that was finished, symptoms return, and he was treated with another 10 days of Cipro. Currently, he denies any of the above-mentioned symptoms other than a slow stream, especially in the morning. He usually feels like he empties out well. He denies any long-standing history of gross hematuria. He denies any urologic history other than having what sounds like a meatotomy by Dr. Redmond Baseman here in [place] when he was 69 years of age. That was apparently performed in the office and was quite painful to the patient's recollection.    Additionally, the patient states that he was told that he had an elevated PSA level. PSA was checked after his infections.   PMH: Past Medical History  Diagnosis Date  . Coronary artery disease   . Ischemic cardiomyopathy   . Diabetes mellitus   . Hyperlipidemia   . Hypertension   . Myocardial infarction   . Edema   . Hypoglycemia     history of episodes x2  . Arthritis     right knee  . Cancer     skin cancer  . Heart attack     7-8 yrs ago    Surgical History: Past Surgical History  Procedure Laterality Date  . Cardiac catheterization    . Coronary stent placement      To an occluded LAD  . Skin cancer excision      chest    Home Medications:    Medication List       This list  is accurate as of: 06/08/15  4:11 PM.  Always use your most recent med list.               aspirin EC 81 MG tablet  Take 4 tablets (325 mg total) by mouth daily.     atorvastatin 80 MG tablet  Commonly known as:  LIPITOR  take 1 tablet by mouth at bedtime     carvedilol 12.5 MG tablet  Commonly known as:  COREG  Take 1 tablet (12.5 mg total) by mouth 2 (two) times daily with a meal.     clopidogrel 75 MG tablet  Commonly known as:  PLAVIX  take 1 tablet by mouth once daily     furosemide 20 MG tablet  Commonly known as:  LASIX  take 1 tablet by mouth once daily     insulin lispro protamine-lispro (50-50) 100 UNIT/ML Susp injection  Commonly known as:  HUMALOG 50/50 MIX  Inject 4 Units into the skin 2 (two) times daily before a meal.     LANTUS 100 UNIT/ML injection  Generic drug:  insulin glargine  Inject 20 Units into the skin daily.     losartan 50 MG tablet  Commonly known as:  COZAAR  Take 25 mg by mouth daily.     multivitamin per tablet  Take 1 tablet by mouth daily.     nitroGLYCERIN 0.4 MG SL tablet  Commonly known as:  NITROSTAT  Place 0.4 mg under the tongue every 5 (five) minutes as needed. Do not exceed 3 doses in 15 minutes.     vitamin B-12 1000 MCG tablet  Commonly known as:  CYANOCOBALAMIN  Take 1,000 mcg by mouth daily.        Allergies: No Known Allergies  Family History: Family History  Problem Relation Age of Onset  . Heart disease Mother   . Heart attack Mother   . Heart disease Father   . Prostate cancer Brother     Social History:  reports that he quit smoking about 20 years ago. He has never used smokeless tobacco. He reports that he does not drink alcohol or use illicit drugs.  ROS: UROLOGY Frequent Urination?: Yes Hard to postpone urination?: No Burning/pain with urination?: Yes Get up at night to urinate?: No Leakage of urine?: No Urine stream starts and stops?: No Trouble starting stream?: No Do you have to strain to  urinate?: No Blood in urine?: No Urinary tract infection?: Yes Sexually transmitted disease?: No Injury to kidneys or bladder?: No Painful intercourse?: No Weak stream?: No Erection problems?: No Penile pain?: No Results for orders placed or performed in visit on 06/08/15  Bladder Scan (Post Void Residual) in office  Result Value Ref Range   Scan Result 123     Gastrointestinal Nausea?: No Vomiting?: No Indigestion/heartburn?: No Diarrhea?: No Constipation?: No  Constitutional Fever: No Night sweats?: No Weight loss?: No Fatigue?: No  Skin Skin rash/lesions?: No Itching?: No  Eyes Blurred vision?: No Double vision?: No  Ears/Nose/Throat Sore throat?: No Sinus problems?: No  Hematologic/Lymphatic Swollen glands?: No Easy bruising?: No  Cardiovascular Leg swelling?: No Chest pain?: No  Respiratory Cough?: No Shortness of breath?: No  Endocrine Excessive thirst?: No  Musculoskeletal Back pain?: No Joint pain?: No  Neurological Headaches?: No Dizziness?: No  Psychologic Depression?: No Anxiety?: No  Physical Exam: BP 118/76 mmHg  Pulse 67  Ht 5\' 9"  (1.753 m)  Wt 197 lb 9.6 oz (89.631 kg)  BMI 29.17 kg/m2  Constitutional:  Alert and oriented, No acute distress. HEENT: [place] AT, moist mucus membranes.  Trachea midline, no masses. Cardiovascular: No clubbing, cyanosis, or edema. Respiratory: Normal respiratory effort, no increased work of breathing. GI: Abdomen is soft, nontender, nondistended, no abdominal masses. Bladder nonpalpable no inguinal hernias GU: No CVA tenderness. Phallus circumcised. Meatus normal. No penile or scrotal lesions. Testicles normal shape, size, consistency, descended bilaterally. Rectal: Normal anal sphincter tone. No rectal masses. Prostate 40 g, symmetrical, nonnodular nontender. Skin: No rashes, bruises or suspicious lesions. Lymph: No cervical or inguinal adenopathy. Neurologic: Grossly intact, no focal deficits,  moving all 4 extremities. Psychiatric: Normal mood and affect.  Laboratory Data: No results found for: WBC, HGB, HCT, MCV, PLT  Lab Results  Component Value Date   CREATININE 0.88 01/03/2011    No results found for: PSA  No results found for: TESTOSTERONE  Lab Results  Component Value Date   HGBA1C 8.7* 01/03/2011   Results for orders placed or performed in visit on 06/08/15  Bladder Scan (Post Void Residual) in office  Result Value Ref Range   Scan Result 123    Results for orders placed or performed in visit on 06/08/15  Bladder Scan (Post Void Residual) in office  Result Value Ref Range   Scan Result 123    Results for  orders placed or performed in visit on 06/08/15  Bladder Scan (Post Void Residual) in office  Result Value Ref Range   Scan Result 123    Urinalysis No results found for: COLORURINE, APPEARANCEUR, LABSPEC, PHURINE, GLUCOSEU, HGBUR, BILIRUBINUR, KETONESUR, PROTEINUR, UROBILINOGEN, NITRITE, LEUKOCYTESUR  Pertinent Imaging: Residual urine volume 120 mL. The patient states that when he gave his urine specimen E did not necessarily empty completely IPSS 5  QOL score 1  Assessment :  1. Recurrent urinary tract infection-currently, the patient is asymptomatic. No bacteria present but he does have 6-10 white cells per microscopic high-power field.  2.  microscopic hematuria-11-30 red cells were microscopic high-power field. This needs to be rechecked. 2 years ago, on the scanned urinalysis, he had 2-4 red cells per microscopic high-power field  3. Elevated PSA-I do not have the patient's most recent PSA, but his PSA 2 years ago, in 2014, was 3.78  Plan: 1. I would prefer the patient have a repeat PSA--if his PSA was significantly above 3.78. This would be an appropriate time. We will try to get his most recent laboratories   2. Urine was cultured to follow-up most recent urinary tract infection  3. I'll bring back in the next month or 2 for urinalysis  checked. If he still has microscopic hematuria, he will need hematuria evaluation i.e. hematuria CT and cystoscopy    Return in about 2 years (around 06/07/2017) for Recheck of urine and residual urine check.  Jorja Loa, [facility] [address], [place], [place] [address] [phone]

## 2015-06-09 LAB — PSA TOTAL (REFLEX TO FREE): PROSTATE SPECIFIC AG, SERUM: 3 ng/mL (ref 0.0–4.0)

## 2015-06-10 LAB — CULTURE, URINE COMPREHENSIVE

## 2015-06-12 ENCOUNTER — Other Ambulatory Visit: Payer: Self-pay | Admitting: Cardiovascular Disease

## 2015-06-19 ENCOUNTER — Ambulatory Visit: Payer: Commercial Managed Care - HMO

## 2015-06-23 DIAGNOSIS — L089 Local infection of the skin and subcutaneous tissue, unspecified: Secondary | ICD-10-CM | POA: Diagnosis not present

## 2015-06-23 DIAGNOSIS — E109 Type 1 diabetes mellitus without complications: Secondary | ICD-10-CM | POA: Diagnosis not present

## 2015-07-05 ENCOUNTER — Other Ambulatory Visit: Payer: Self-pay | Admitting: Cardiovascular Disease

## 2015-07-13 ENCOUNTER — Other Ambulatory Visit: Payer: Self-pay | Admitting: Cardiovascular Disease

## 2015-07-28 ENCOUNTER — Other Ambulatory Visit: Payer: Self-pay | Admitting: Cardiovascular Disease

## 2015-07-28 DIAGNOSIS — L02619 Cutaneous abscess of unspecified foot: Secondary | ICD-10-CM | POA: Diagnosis not present

## 2015-07-28 DIAGNOSIS — L03119 Cellulitis of unspecified part of limb: Secondary | ICD-10-CM | POA: Diagnosis not present

## 2015-07-28 DIAGNOSIS — E1040 Type 1 diabetes mellitus with diabetic neuropathy, unspecified: Secondary | ICD-10-CM | POA: Diagnosis not present

## 2015-07-28 DIAGNOSIS — L851 Acquired keratosis [keratoderma] palmaris et plantaris: Secondary | ICD-10-CM | POA: Diagnosis not present

## 2015-07-28 DIAGNOSIS — L97521 Non-pressure chronic ulcer of other part of left foot limited to breakdown of skin: Secondary | ICD-10-CM | POA: Diagnosis not present

## 2015-07-31 DIAGNOSIS — Z23 Encounter for immunization: Secondary | ICD-10-CM | POA: Diagnosis not present

## 2015-08-02 DIAGNOSIS — H40039 Anatomical narrow angle, unspecified eye: Secondary | ICD-10-CM | POA: Diagnosis not present

## 2015-08-08 ENCOUNTER — Ambulatory Visit: Payer: Commercial Managed Care - HMO | Admitting: Urology

## 2015-08-08 ENCOUNTER — Encounter: Payer: Self-pay | Admitting: Urology

## 2015-08-10 DIAGNOSIS — L97522 Non-pressure chronic ulcer of other part of left foot with fat layer exposed: Secondary | ICD-10-CM | POA: Diagnosis not present

## 2015-08-15 DIAGNOSIS — H4312 Vitreous hemorrhage, left eye: Secondary | ICD-10-CM | POA: Diagnosis not present

## 2015-08-25 DIAGNOSIS — H43812 Vitreous degeneration, left eye: Secondary | ICD-10-CM | POA: Diagnosis not present

## 2015-08-31 DIAGNOSIS — L97522 Non-pressure chronic ulcer of other part of left foot with fat layer exposed: Secondary | ICD-10-CM | POA: Diagnosis not present

## 2015-09-19 DIAGNOSIS — E109 Type 1 diabetes mellitus without complications: Secondary | ICD-10-CM | POA: Diagnosis not present

## 2015-09-26 DIAGNOSIS — Z794 Long term (current) use of insulin: Secondary | ICD-10-CM | POA: Diagnosis not present

## 2015-09-26 DIAGNOSIS — E1065 Type 1 diabetes mellitus with hyperglycemia: Secondary | ICD-10-CM | POA: Diagnosis not present

## 2015-09-28 DIAGNOSIS — L97522 Non-pressure chronic ulcer of other part of left foot with fat layer exposed: Secondary | ICD-10-CM | POA: Diagnosis not present

## 2015-09-30 ENCOUNTER — Other Ambulatory Visit: Payer: Self-pay | Admitting: Cardiovascular Disease

## 2015-10-02 DIAGNOSIS — H2513 Age-related nuclear cataract, bilateral: Secondary | ICD-10-CM | POA: Diagnosis not present

## 2015-11-08 DIAGNOSIS — L97529 Non-pressure chronic ulcer of other part of left foot with unspecified severity: Secondary | ICD-10-CM | POA: Diagnosis not present

## 2015-11-08 DIAGNOSIS — E10621 Type 1 diabetes mellitus with foot ulcer: Secondary | ICD-10-CM | POA: Diagnosis not present

## 2015-11-08 DIAGNOSIS — E1065 Type 1 diabetes mellitus with hyperglycemia: Secondary | ICD-10-CM | POA: Diagnosis not present

## 2015-11-09 DIAGNOSIS — L97521 Non-pressure chronic ulcer of other part of left foot limited to breakdown of skin: Secondary | ICD-10-CM | POA: Diagnosis not present

## 2015-11-15 ENCOUNTER — Telehealth: Payer: Self-pay | Admitting: *Deleted

## 2015-11-15 NOTE — Telephone Encounter (Signed)
Request for surgical clearance:  1. What type of surgery is being performed? Tooth apicoectomy    2. When is this surgery scheduled?  Not yet waiting on this   3. Are there any medications that need to be held prior to surgery and how long? Blood thinner, not sure when to take him off this.   4. Name of physician performing surgery? Dr Salome Arnt  5. What is your office phone and fax number? [phone] [phone]

## 2015-11-19 NOTE — Telephone Encounter (Signed)
Patients choice whether to hold plavix for 5 days prior to dental procedure Would stay on aspirin, If they are pulling a tooth, would hold plavix Acceptable risk

## 2015-11-20 NOTE — Telephone Encounter (Signed)
Dr. Donivan Scull recommendation routed to Dr. Lenard Lance office at [phone].

## 2015-12-13 DIAGNOSIS — K048 Radicular cyst: Secondary | ICD-10-CM | POA: Diagnosis not present

## 2016-01-09 DIAGNOSIS — L989 Disorder of the skin and subcutaneous tissue, unspecified: Secondary | ICD-10-CM | POA: Diagnosis not present

## 2016-01-09 DIAGNOSIS — M25561 Pain in right knee: Secondary | ICD-10-CM | POA: Diagnosis not present

## 2016-02-07 DIAGNOSIS — E1065 Type 1 diabetes mellitus with hyperglycemia: Secondary | ICD-10-CM | POA: Diagnosis not present

## 2016-02-12 DIAGNOSIS — L989 Disorder of the skin and subcutaneous tissue, unspecified: Secondary | ICD-10-CM | POA: Diagnosis not present

## 2016-02-14 DIAGNOSIS — E109 Type 1 diabetes mellitus without complications: Secondary | ICD-10-CM | POA: Diagnosis not present

## 2016-02-15 DIAGNOSIS — L97521 Non-pressure chronic ulcer of other part of left foot limited to breakdown of skin: Secondary | ICD-10-CM | POA: Diagnosis not present

## 2016-02-26 ENCOUNTER — Telehealth: Payer: Self-pay | Admitting: Cardiovascular Disease

## 2016-02-26 NOTE — Telephone Encounter (Signed)
Attempted to call patient to reschedule 03/14/16 appointment with Dr Rockey Situ For Provider has [place], and won't be in office that day.

## 2016-03-14 ENCOUNTER — Ambulatory Visit: Payer: Self-pay | Admitting: Cardiovascular Disease

## 2016-03-25 ENCOUNTER — Encounter: Payer: Commercial Managed Care - HMO | Attending: Surgery | Admitting: Surgery

## 2016-03-25 DIAGNOSIS — I10 Essential (primary) hypertension: Secondary | ICD-10-CM | POA: Insufficient documentation

## 2016-03-25 DIAGNOSIS — I252 Old myocardial infarction: Secondary | ICD-10-CM | POA: Insufficient documentation

## 2016-03-25 DIAGNOSIS — L97211 Non-pressure chronic ulcer of right calf limited to breakdown of skin: Secondary | ICD-10-CM | POA: Diagnosis not present

## 2016-03-25 DIAGNOSIS — E10622 Type 1 diabetes mellitus with other skin ulcer: Secondary | ICD-10-CM | POA: Diagnosis not present

## 2016-03-25 DIAGNOSIS — Z79899 Other long term (current) drug therapy: Secondary | ICD-10-CM | POA: Insufficient documentation

## 2016-03-25 DIAGNOSIS — E785 Hyperlipidemia, unspecified: Secondary | ICD-10-CM | POA: Diagnosis not present

## 2016-03-25 DIAGNOSIS — I2510 Atherosclerotic heart disease of native coronary artery without angina pectoris: Secondary | ICD-10-CM | POA: Insufficient documentation

## 2016-03-25 DIAGNOSIS — L97821 Non-pressure chronic ulcer of other part of left lower leg limited to breakdown of skin: Secondary | ICD-10-CM | POA: Diagnosis not present

## 2016-03-25 DIAGNOSIS — Z87891 Personal history of nicotine dependence: Secondary | ICD-10-CM | POA: Insufficient documentation

## 2016-03-25 DIAGNOSIS — Z794 Long term (current) use of insulin: Secondary | ICD-10-CM | POA: Insufficient documentation

## 2016-03-25 DIAGNOSIS — M722 Plantar fascial fibromatosis: Secondary | ICD-10-CM | POA: Diagnosis not present

## 2016-03-25 DIAGNOSIS — C44722 Squamous cell carcinoma of skin of right lower limb, including hip: Secondary | ICD-10-CM | POA: Insufficient documentation

## 2016-03-25 DIAGNOSIS — C44792 Other specified malignant neoplasm of skin of right lower limb, including hip: Secondary | ICD-10-CM | POA: Diagnosis not present

## 2016-03-25 DIAGNOSIS — L97212 Non-pressure chronic ulcer of right calf with fat layer exposed: Secondary | ICD-10-CM | POA: Diagnosis not present

## 2016-03-25 DIAGNOSIS — L97929 Non-pressure chronic ulcer of unspecified part of left lower leg with unspecified severity: Secondary | ICD-10-CM | POA: Diagnosis not present

## 2016-03-25 NOTE — Progress Notes (Addendum)
[name] ([id number]) Visit Report for 03/25/2016 Biopsy Details [name] 03/25/2016 8:00 Patient Name: Date of Service: H. AM Medical Record Patient Account Number: [id number] Number: Treating RN: Cornell Barman Date of Birth/Sex: [birth year] (70 y.o. Male) Other Clinician: Primary Care Physician: [name] Treating Christin Fudge Referring Physician: [name] Physician/Extender: Suella Grove in Treatment: 0 Biopsy Performed for: Wound #1 Left, Medial, Anterior Lower Leg Location(s): Wound Margin Performed By: Physician Christin Fudge, MD Tissue Punch: No Number of Specimens Taken: 2 Specimen Sent To Pathology: Yes Time-Out Taken: Yes Pain Control: Lidocaine Injectable Lidocaine Percent: 2% Instrument: Blade Bleeding: Minimum Hemostasis Achieved: Pressure Procedural Pain: 0 Post Procedural Pain: 0 Response to Treatment: Procedure was tolerated well Post Procedure Diagnosis Same as Pre-procedure Electronic Signature(s) Signed: 03/25/2016 9:05:44 AM By: Christin Fudge MD, FACS Previous Signature: 03/25/2016 9:04:27 AM Version By: Christin Fudge MD, FACS Entered By: Christin Fudge on 03/25/2016 09:05:44 Reicks, [name] ([id number]) -------------------------------------------------------------------------------- Chief Complaint Document Details [name], [name] 03/25/2016 8:00 Patient Name: Date of Service: H. AM Medical Record Patient Account Number: [id number] Number: Treating RN: Cornell Barman Date of Birth/Sex: 03/14/1946 (70 y.o. Male) Other Clinician: Primary Care Physician: [name] Treating Christin Fudge Referring Physician: [name] Physician/Extender: Weeks in Treatment: 0 Information Obtained from: Patient Chief Complaint Patients presents for treatment of an open diabetic ulcer the right lower extremity for about 6 months Electronic Signature(s) Signed: 03/25/2016 8:59:55 AM By: Christin Fudge MD,  FACS Entered By: Christin Fudge on 03/25/2016 08:59:55 Dawson, [name] ([id number]) -------------------------------------------------------------------------------- HPI Details [name], [name] 03/25/2016 8:00 Patient Name: Date of Service: [name] AM Medical Record Patient Account Number: [id number] Number: Treating RN: Cornell Barman Date of Birth/Sex: [birth year] (70 y.o. Male) Other Clinician: Primary Care Physician: [name] Treating Christin Fudge Referring Physician: [name] Physician/Extender: Weeks in Treatment: 0 History of Present Illness Location: right anterior shin Quality: Patient reports No Pain. Severity: Patient states wound are getting worse. Duration: Patient has had the wound for > 6 months prior to seeking treatment at the wound center Context: The wound appeared gradually over time Modifying Factors: Other treatment(s) tried include: taken doxycycline and use bacitracin Associated Signs and Symptoms: Patient reports presence of swelling HPI Description: 70 year old male recently seen by his PCP Dr. [name] who saw him for a wound on the right leg which is less tender and he is continuing to use bacitracin and has completed her course of doxycycline. Past medical history of coronary artery disease, diabetes mellitus type 1, hyperlipidemia, hypertension, MI, plantar fascial fibromatosis, pneumonia, status post foot surgery due to trauma on the left side, coronary angioplasty. he is a former smoker and quit in July 1996. last hemoglobin A1c was 7.8 % in April of this year Electronic Signature(s) Signed: 03/25/2016 9:00:47 AM By: Christin Fudge MD, FACS Previous Signature: 03/25/2016 8:16:56 AM Version By: Christin Fudge MD, FACS Previous Signature: 03/25/2016 8:09:34 AM Version By: Christin Fudge MD, FACS Entered By: Christin Fudge on 03/25/2016 09:00:47 Gruenhagen, [name]  ([id number]) -------------------------------------------------------------------------------- Physical Exam Details [name], [name] 03/25/2016 8:00 Patient Name: Date of Service: H. AM Medical Record Patient Account Number: [id number] Number: Treating RN: Cornell Barman Date of Birth/Sex: May 15, 1946 (70 y.o. Male) Other Clinician: Primary Care Physician: Cristie Hem Referring Physician: [name] Physician/Extender: Weeks in Treatment: 0 Constitutional . Pulse regular. Respirations normal and unlabored. Afebrile. . Eyes Nonicteric. Reactive to light. Ears, Nose, Mouth, and Throat Lips, teeth, and gums WNL.Marland Kitchen Moist mucosa without lesions. Neck supple and  nontender. No palpable supraclavicular or cervical adenopathy. Normal sized without goiter. Respiratory WNL. No retractions.. Cardiovascular Pedal Pulses WNL. ABIs were noncompressible. No clubbing, cyanosis or edema. Gastrointestinal (GI) Abdomen without masses or tenderness.. No liver or spleen enlargement or tenderness.. Lymphatic No adneopathy. No adenopathy. No adenopathy. Musculoskeletal Adexa without tenderness or enlargement.. Digits and nails w/o clubbing, cyanosis, infection, petechiae, ischemia, or inflammatory conditions.. Integumentary (Hair, Skin) No suspicious lesions. No crepitus or fluctuance. No peri-wound warmth or erythema. No masses.Marland Kitchen Psychiatric Judgement and insight Intact.. No evidence of depression, anxiety, or agitation.. Notes slightly raised skin in the area of the ulceration with 2 superficial ulcers and no surrounding erythema. This could be a squamous cell carcinoma. Electronic Signature(s) Signed: 03/25/2016 9:01:40 AM By: Christin Fudge MD, FACS Entered By: Christin Fudge on 03/25/2016 09:01:40 Domke, [name] ([id number]) -------------------------------------------------------------------------------- Physician Orders Details Mensing, Linley  03/25/2016 8:00 Patient Name: Date of Service: H. AM Medical Record Patient Account Number: [id number] Number: Treating RN: Cornell Barman Date of Birth/Sex: May 23, 1946 (70 y.o. Male) Other Clinician: Primary Care Physician: Cristie Hem Referring Physician: [name] Physician/Extender: Suella Grove in Treatment: 0 Verbal / Phone Orders: Yes Clinician: Cornell Barman Read Back and Verified: Yes Diagnosis Coding Wound Cleansing Wound #1 Left,Medial,Anterior Lower Leg o Clean wound with Normal Saline. Anesthetic Wound #1 Left,Medial,Anterior Lower Leg o Injected 2% Lidocaine without epinephrine prior to debridement Primary Wound Dressing o Aquacel Ag Secondary Dressing Wound #1 Left,Medial,Anterior Lower Leg o Boardered Foam Dressing Dressing Change Frequency Wound #1 Left,Medial,Anterior Lower Leg o Change dressing every other day. Follow-up Appointments Wound #1 Left,Medial,Anterior Lower Leg o Return Appointment in 1 week. Edema Control Wound #1 Left,Medial,Anterior Lower Leg o Patient to wear own compression stockings Consults o Vascular - Arterial studies Laboratory o Tissue Pathology biopsy report (PATH) - Right medial lower leg TREVANTE, COBURN ([id number]) oooo LOINC Code: [id number] oooo Convenience Name: Tiss Path Bx report Electronic Signature(s) Signed: 03/25/2016 3:35:28 PM By: Christin Fudge MD, FACS Signed: 03/26/2016 11:45:12 AM By: Gretta Cool, RN, BSN, Kim RN, BSN Entered By: Gretta Cool, RN, BSN, Kim on 03/25/2016 09:19:02 Malott, [name] ([id number]) -------------------------------------------------------------------------------- Problem List Details Muramoto, Somers 03/25/2016 8:00 Patient Name: Date of Service: H. AM Medical Record Patient Account Number: [id number] Number: Treating RN: Cornell Barman Date of Birth/Sex: 05-11-1946 (70 y.o. Male) Other Clinician: Primary Care Physician: [name] Treating Christin Fudge Referring Physician: [name] Physician/Extender: Weeks in Treatment: 0 Active Problems ICD-10 Encounter Code Description Active Date Diagnosis E10.622 Type 1 diabetes mellitus with other skin ulcer 03/25/2016 Yes L97.212 Non-pressure chronic ulcer of right calf with fat layer 03/25/2016 Yes exposed C44.722 Squamous cell carcinoma of skin of right lower limb, 03/25/2016 Yes including hip Inactive Problems Resolved Problems Electronic Signature(s) Signed: 03/25/2016 8:59:41 AM By: Christin Fudge MD, FACS Entered By: Christin Fudge on 03/25/2016 08:59:41 Brem, [name] ([id number]) -------------------------------------------------------------------------------- Progress Note Details Fogel, [place] 03/25/2016 8:00 Patient Name: Date of Service: [name] AM Medical Record Patient Account Number: [id number] Number: Treating RN: Cornell Barman Date of Birth/Sex: 1946/09/03 (70 y.o. Male) Other Clinician: Primary Care Physician: [name] Treating Christin Fudge Referring Physician: [name] Physician/Extender: Weeks in Treatment: 0 Subjective Chief Complaint Information obtained from Patient Patients presents for treatment of an open diabetic ulcer the right lower extremity for about 6 months History of Present Illness (HPI) The following HPI elements were documented for the patient's wound: Location: right anterior shin Quality: Patient reports No Pain. Severity: Patient states wound are getting worse.  Duration: Patient has had the wound for > 6 months prior to seeking treatment at the wound center Context: The wound appeared gradually over time Modifying Factors: Other treatment(s) tried include: taken doxycycline and use bacitracin Associated Signs and Symptoms: Patient reports presence of swelling 70 year old male recently seen by his PCP Dr. [name] who saw him for a wound on the right leg which is less tender  and he is continuing to use bacitracin and has completed her course of doxycycline. Past medical history of coronary artery disease, diabetes mellitus type 1, hyperlipidemia, hypertension, MI, plantar fascial fibromatosis, pneumonia, status post foot surgery due to trauma on the left side, coronary angioplasty. he is a former smoker and quit in July 1996. last hemoglobin A1c was 7.8 % in April of this year Wound History Patient presents with 1 open wound that has been present for approximately 6 mos. Patient has been treating wound in the following manner: salve and dressing. The wound has been healed in the past but has re-opened. Laboratory tests have not been performed in the last month. Patient reportedly has tested positive for an antibiotic resistant organism. Patient reportedly has not tested positive for osteomyelitis. Patient reportedly has had testing performed to evaluate circulation in the legs. Patient experiences the following problems associated with their wounds: infection. Patient History Information obtained from Patient. Allergies No Known Drug Intolerances LEYLAND, NICKLIN ([id number]) Family History Heart Disease - Mother, Father, No family history of Cancer, Diabetes, Hereditary Spherocytosis, Hypertension, Kidney Disease, Lung Disease, Seizures, Stroke, Thyroid Problems, Tuberculosis. Social History Former smoker - 30 years ago, Marital Status - Single, Alcohol Use - Never, Drug Use - No History, Caffeine Use - Moderate. Medical History Eyes Denies history of Cataracts, Glaucoma, Optic Neuritis Ear/Nose/Mouth/Throat Denies history of Chronic sinus problems/congestion Hematologic/Lymphatic Denies history of Anemia, Hemophilia, Human Immunodeficiency Virus, Lymphedema, Sickle Cell Disease Respiratory Denies history of Aspiration, Asthma, Chronic Obstructive Pulmonary Disease (COPD), Pneumothorax, Sleep Apnea, Tuberculosis Cardiovascular Patient has  history of Hypertension - medication controlled Denies history of Angina, Arrhythmia, Congestive Heart Failure, Coronary Artery Disease, Deep Vein Thrombosis, Hypotension, Myocardial Infarction, Peripheral Arterial Disease, Peripheral Venous Disease, Phlebitis, Vasculitis Gastrointestinal Denies history of Cirrhosis , Colitis, Crohn s, Hepatitis A, Hepatitis B, Hepatitis C Endocrine Patient has history of Type I Diabetes Denies history of Type II Diabetes Genitourinary Denies history of End Stage Renal Disease Immunological Denies history of Lupus Erythematosus, Raynaud s, Scleroderma Integumentary (Skin) Denies history of History of Burn, History of pressure wounds Musculoskeletal Patient has history of Osteoarthritis - knee Denies history of Gout, Rheumatoid Arthritis, Osteomyelitis Neurologic Denies history of Dementia, Neuropathy, Quadriplegia, Paraplegia, Seizure Disorder Oncologic Denies history of Received Chemotherapy, Received Radiation Psychiatric Denies history of Anorexia/bulimia, Confinement Anxiety Patient is treated with Insulin. Blood sugar is tested. Blood sugar results noted at the following times: Breakfast - 120, Dinner - 170. Medical And Surgical History Notes MOMAR, SICURELLA ([id number]) Constitutional Symptoms (General Health) Stent in heart (2009); Wound on left foot (treated by Cleda Mccreedy) stepped on rock; A1C 7.8; Review of Systems (ROS) Constitutional Symptoms (General Health) The patient has no complaints or symptoms. Eyes The patient has no complaints or symptoms. Ear/Nose/Mouth/Throat The patient has no complaints or symptoms. Hematologic/Lymphatic The patient has no complaints or symptoms. Respiratory The patient has no complaints or symptoms. Cardiovascular Complains or has symptoms of LE edema - left leg. Gastrointestinal The patient has no complaints or symptoms. Endocrine The patient has no complaints or symptoms. Genitourinary The  patient has no  complaints or symptoms. Immunological The patient has no complaints or symptoms. Integumentary (Skin) Complains or has symptoms of Wounds, Bleeding or bruising tendency. Denies complaints or symptoms of Breakdown, Swelling. Musculoskeletal The patient has no complaints or symptoms. Neurologic The patient has no complaints or symptoms. Oncologic The patient has no complaints or symptoms. Psychiatric The patient has no complaints or symptoms. Medications lantis SQ 20 20 SQ every morning lantis SQ 5 5 SQ every evening carvedilol 12.5 mg tablet oral tablet oral atorvastatin 80 mg tablet oral tablet oral losartan 50 mg tablet oral tablet oral Novolog 100 unit/mL subcutaneous solution subcutaneous 3 3 solution subcutaneous twice a day furosemide 20 mg tablet oral tablet oral clopidogrel 75 mg tablet oral tablet oral Mckeever, Elior H. ([id number]) Objective Constitutional Pulse regular. Respirations normal and unlabored. Afebrile. Vitals Time Taken: 8:06 AM, Height: 69 in, Weight: 193.7 lbs, BMI: 28.6, Temperature: 97.6 F, Pulse: 65 bpm, Respiratory Rate: 18 breaths/min, Blood Pressure: 127/63 mmHg. Eyes Nonicteric. Reactive to light. Ears, Nose, Mouth, and Throat Lips, teeth, and gums WNL.Marland Kitchen Moist mucosa without lesions. Neck supple and nontender. No palpable supraclavicular or cervical adenopathy. Normal sized without goiter. Respiratory WNL. No retractions.. Cardiovascular Pedal Pulses WNL. ABIs were noncompressible. No clubbing, cyanosis or edema. Gastrointestinal (GI) Abdomen without masses or tenderness.. No liver or spleen enlargement or tenderness.. Lymphatic No adneopathy. No adenopathy. No adenopathy. Musculoskeletal Adexa without tenderness or enlargement.. Digits and nails w/o clubbing, cyanosis, infection, petechiae, ischemia, or inflammatory conditions.Marland Kitchen Psychiatric Judgement and insight Intact.. No evidence of depression, anxiety, or  agitation.. General Notes: slightly raised skin in the area of the ulceration with 2 superficial ulcers and no surrounding erythema. This could be a squamous cell carcinoma. Integumentary (Hair, Skin) No suspicious lesions. No crepitus or fluctuance. No peri-wound warmth or erythema. No masses.. Wound #1 status is Open. Original cause of wound was Gradually Appeared. The wound is located on the Left,Medial,Anterior Lower Leg. The wound measures 0.2cm length x 0.3cm width x 0.2cm depth; 0.047cm^2 area and 0.009cm^3 volume. The wound is limited to skin breakdown. There is a medium Siordia, Catrell H. ([id number]) amount of serosanguineous drainage noted. The wound margin is flat and intact. There is small (1-33%) pink granulation within the wound bed. There is no necrotic tissue within the wound bed. The periwound skin appearance did not exhibit: Callus, Crepitus, Excoriation, Fluctuance, Friable, Induration, Localized Edema, Rash, Scarring, Dry/Scaly, Maceration, Moist, Atrophie Blanche, Cyanosis, Ecchymosis, Hemosiderin Staining, Mottled, Pallor, Rubor, Erythema. Assessment Active Problems ICD-10 E10.622 - Type 1 diabetes mellitus with other skin ulcer L97.212 - Non-pressure chronic ulcer of right calf with fat layer exposed C44.722 - Squamous cell carcinoma of skin of right lower limb, including hip pleasant 71 year old diabetic with long history of type 1 diabetes mellitus has been adequately controlled but has this nonhealing ulcer on the right shin which is suspicious for a squamous cell carcinoma. He also has noncompressible blood vessels I have recommended: 1. a wedge biopsy from the edges of this lesion to confirm squamous cell carcinoma 2. Alternate drain dressings with Aquacel Ag and a bordered foam 3. Arterial duplex studies of the lower extremities 4. Regular visits to the wound Center for appropriate management I have answered all his questions and he will be  compliant. Procedures Wound #1 Wound #1 is an Infection - not elsewhere classified located on the Left, Medial, Anterior Lower Leg . There was a biopsy performed by Christin Fudge, MD. There was a biopsy performed on Wound Margin. The skin was cleansed  and prepped with anti-septic followed by pain control using Lidocaine Injectable: 2%. Tissue was removed at its base with the following instrument(s): Blade and sent to pathology. A Minimum amount of bleeding was controlled with Pressure. A time out was conducted prior to the start of the procedure. The procedure was tolerated well with a pain level of 0 throughout and a pain level of 0 following the procedure. Post procedure Diagnosis Wound #1: Same as Pre-Procedure Meidinger, Fontaine H. ([id number]) Plan Wound Cleansing: Wound #1 Left,Medial,Anterior Lower Leg: Clean wound with Normal Saline. Anesthetic: Wound #1 Left,Medial,Anterior Lower Leg: Injected 2% Lidocaine without epinephrine prior to debridement Primary Wound Dressing: Aquacel Ag Secondary Dressing: Wound #1 Left,Medial,Anterior Lower Leg: Boardered Foam Dressing Dressing Change Frequency: Wound #1 Left,Medial,Anterior Lower Leg: Change dressing every other day. Follow-up Appointments: Wound #1 Left,Medial,Anterior Lower Leg: Return Appointment in 1 week. Edema Control: Wound #1 Left,Medial,Anterior Lower Leg: Patient to wear own compression stockings Consults ordered were: Vascular - Arterial studies Laboratory ordered were: Tiss Path Bx report - Right medial lower leg pleasant 70 year old diabetic with long history of type 1 diabetes mellitus has been adequately controlled but has this nonhealing ulcer on the right shin which is suspicious for a squamous cell carcinoma. He also has noncompressible blood vessels I have recommended: 1. a wedge biopsy from the edges of this lesion to confirm squamous cell carcinoma 2. Alternate drain dressings with Aquacel Ag and a  bordered foam 3. Arterial duplex studies of the lower extremities 4. Regular visits to the wound Center for appropriate management I have answered all his questions and he will be compliant. MAZON, SMEDLEY ([id number]) Electronic Signature(s) Signed: 04/01/2016 3:42:17 PM By: Christin Fudge MD, FACS Previous Signature: 03/25/2016 3:37:19 PM Version By: Christin Fudge MD, FACS Previous Signature: 03/25/2016 9:05:57 AM Version By: Christin Fudge MD, FACS Previous Signature: 03/25/2016 9:04:42 AM Version By: Christin Fudge MD, FACS Previous Signature: 03/25/2016 9:03:38 AM Version By: Christin Fudge MD, FACS Entered By: Christin Fudge on 04/01/2016 15:42:16 Hayes, [name] ([id number]) -------------------------------------------------------------------------------- ROS/PFSH Details Pate, Collis 03/25/2016 8:00 Patient Name: Date of Service: H. AM Medical Record Patient Account Number: [id number] Number: Treating RN: Cornell Barman Date of Birth/Sex: [birth year] (70 y.o. Male) Other Clinician: Primary Care Physician: [name] Treating Christin Fudge Referring Physician: [name] Physician/Extender: Weeks in Treatment: 0 Information Obtained From Patient Wound History Do you currently have one or more open woundso Yes How many open wounds do you currently haveo 1 Approximately how long have you had your woundso 6 mos How have you been treating your wound(s) until nowo salve and dressing Has your wound(s) ever healed and then re-openedo Yes Have you had any lab work done in the past montho No Have you tested positive for an antibiotic resistant organism (MRSA, VRE)o Yes Have you tested positive for osteomyelitis (bone infection)o No Have you had any tests for circulation on your legso Yes Have you had other problems associated with your woundso Infection Cardiovascular Complaints and Symptoms: Positive for: LE edema - left leg Medical History: Positive  for: Hypertension - medication controlled Negative for: Angina; Arrhythmia; Congestive Heart Failure; Coronary Artery Disease; Deep Vein Thrombosis; Hypotension; Myocardial Infarction; Peripheral Arterial Disease; Peripheral Venous Disease; Phlebitis; Vasculitis Integumentary (Skin) Complaints and Symptoms: Positive for: Wounds; Bleeding or bruising tendency Negative for: Breakdown; Swelling Medical History: Negative for: History of Burn; History of pressure wounds Constitutional Symptoms (General Health) Complaints and Symptoms: No Complaints or Symptoms Medical HistoryCLEBERT, PEREDA ([id number]) Past Medical History  Notes: Stent in heart (2009); Wound on left foot (treated by Cleda Mccreedy) stepped on rock; A1C 7.8; Eyes Complaints and Symptoms: No Complaints or Symptoms Medical History: Negative for: Cataracts; Glaucoma; Optic Neuritis Ear/Nose/Mouth/Throat Complaints and Symptoms: No Complaints or Symptoms Medical History: Negative for: Chronic sinus problems/congestion Hematologic/Lymphatic Complaints and Symptoms: No Complaints or Symptoms Medical History: Negative for: Anemia; Hemophilia; Human Immunodeficiency Virus; Lymphedema; Sickle Cell Disease Respiratory Complaints and Symptoms: No Complaints or Symptoms Medical History: Negative for: Aspiration; Asthma; Chronic Obstructive Pulmonary Disease (COPD); Pneumothorax; Sleep Apnea; Tuberculosis Gastrointestinal Complaints and Symptoms: No Complaints or Symptoms Medical History: Negative for: Cirrhosis ; Colitis; Crohnos; Hepatitis A; Hepatitis B; Hepatitis C Endocrine Complaints and Symptoms: No Complaints or Symptoms Medical History: Positive for: Type I Diabetes Negative for: Type II Diabetes Mcghee, Josuel H. ([id number]) Time with diabetes: 40 years Treated with: Insulin Blood sugar tested every day: Yes Tested : Blood sugar testing results: Breakfast: 120; Dinner: 170 Genitourinary Complaints  and Symptoms: No Complaints or Symptoms Medical History: Negative for: End Stage Renal Disease Immunological Complaints and Symptoms: No Complaints or Symptoms Medical History: Negative for: Lupus Erythematosus; Raynaudos; Scleroderma Musculoskeletal Complaints and Symptoms: No Complaints or Symptoms Medical History: Positive for: Osteoarthritis - knee Negative for: Gout; Rheumatoid Arthritis; Osteomyelitis Neurologic Complaints and Symptoms: No Complaints or Symptoms Medical History: Negative for: Dementia; Neuropathy; Quadriplegia; Paraplegia; Seizure Disorder Oncologic Complaints and Symptoms: No Complaints or Symptoms Medical History: Negative for: Received Chemotherapy; Received Radiation Psychiatric Complaints and Symptoms: No Complaints or Symptoms Depp, Terrian H. ([id number]) Medical History: Negative for: Anorexia/bulimia; Confinement Anxiety Family and Social History Cancer: No; Diabetes: No; Heart Disease: Yes - Mother, Father; Hereditary Spherocytosis: No; Hypertension: No; Kidney Disease: No; Lung Disease: No; Seizures: No; Stroke: No; Thyroid Problems: No; Tuberculosis: No; Former smoker - 30 years ago; Marital Status - Single; Alcohol Use: Never; Drug Use: No History; Caffeine Use: Moderate; Advanced Directives: Yes (Not Provided); Patient does not want information on Advanced Directives; Living Will: Yes (Not Provided) Physician Affirmation I have reviewed and agree with the above information. Electronic Signature(s) Signed: 03/25/2016 3:35:28 PM By: Christin Fudge MD, FACS Signed: 03/26/2016 11:45:12 AM By: Gretta Cool RN, BSN, Kim RN, BSN Previous Signature: 03/25/2016 8:35:32 AM Version By: Christin Fudge MD, FACS Entered By: Gretta Cool RN, BSN, Kim on 03/25/2016 08:38:34 Chausse, [name] ([id number]) -------------------------------------------------------------------------------- SuperBill Details Patient Name: Lalanne, Jaben H. Date of Service:  03/25/2016 Medical Record Number: [id number] Patient Account Number: [id number] Date of Birth/Sex: 01/14/1946 (70 y.o. 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 C44.722 Squamous cell carcinoma of skin of right lower limb, including hip Facility Procedures CPT4 Code Description: [id number] - WOUND CARE VISIT-LEV 3 EST PT Modifier: Quantity: 1 CPT4 Code Description: [id number] - BIOPSY SKIN-ONE ICD-10 Description Diagnosis E10.622 Type 1 diabetes mellitus with other skin ulcer C44.722 Squamous cell carcinoma of skin of right lower limb, L97.212 Non-pressure chronic ulcer of right calf with  fat lay Modifier: including h er exposed Quantity: 1 ip Physician Procedures CPT4 Code Description: [id number] - WC PHYS LEVEL 4 - NEW PT ICD-10 Description Diagnosis E10.622 Type 1 diabetes mellitus with other skin ulcer C44.722 Squamous cell carcinoma of skin of right lower lim L97.212 Non-pressure chronic ulcer of right  calf with fat  Modifier: 25 b, including h layer exposed Quantity: 1 ip CPT4 Code Description: [id number] - WC PHYS BIOPSY-SKIN ONE ICD-10 Description Diagnosis E10.622 Type 1 diabetes mellitus with other skin ulcer C44.722 Squamous cell carcinoma of skin of right lower lim L97.212 Non-pressure chronic ulcer of right calf  with fat Mangano, Graden H. ([id number]) Modifier: b, including h layer exposed Quantity: 1 ip Electronic Signature(s) Signed: 03/25/2016 9:05:09 AM By: Christin Fudge MD, FACS Entered By: Christin Fudge on 03/25/2016 09:05:09

## 2016-03-26 NOTE — Progress Notes (Addendum)
DOMINIQUE, FORLENZA ([id number]) Visit Report for 03/25/2016 Allergy List Details Patient Name: [name], [name] Date of Service: 03/25/2016 8:00 AM Medical Record Number: [id number] Patient Account Number: [id number] Date of Birth/Sex: Jul 17, 1946 (70 y.o. Male) Treating RN: Cornell Barman Primary Care Physician: [name] Other Clinician: Referring Physician: [name] Treating Physician/Extender: Frann Rider in Treatment: 0 Allergies Active Allergies No Known Drug Intolerances Allergy Notes Electronic Signature(s) Signed: 03/26/2016 11:45:12 AM By: Gretta Cool, RN, BSN, Kim RN, BSN Entered By: Gretta Cool, RN, BSN, Kim on 03/25/2016 08:18:00 Boan, [name] ([id number]) -------------------------------------------------------------------------------- Arrival Information Details Patient Name: [name] Date of Service: 03/25/2016 8:00 AM Medical Record Number: [id number] Patient Account Number: [id number] Date of Birth/Sex: 05-25-1946 (70 y.o. Male) Treating RN: Cornell Barman Primary Care Physician: [name] Other Clinician: Referring Physician: [name] Treating Physician/Extender: Frann Rider in Treatment: 0 Visit Information Patient Arrived: Ambulatory Arrival Time: 08:05 Accompanied By: self Transfer Assistance: None Patient Identification Verified: Yes Secondary Verification Process Yes Completed: Patient Has Alerts: Yes Patient Alerts: Patient on Blood Thinner Clopidogrel Asoirin 81mg  ABI: Non- compress Electronic Signature(s) Signed: 03/26/2016 11:45:12 AM By: Gretta Cool, RN, BSN, Kim RN, BSN Entered By: Gretta Cool, RN, BSN, Kim on 03/25/2016 08:42:28 Wetherington, [name] ([id number]) -------------------------------------------------------------------------------- Clinic Level of Care Assessment Details Patient Name: [name], [name] Date of Service: 03/25/2016 8:00 AM Medical Record Number: [id number] Patient Account  Number: [id number] Date of Birth/Sex: 1946-01-26 (70 y.o. Male) Treating RN: Cornell Barman Primary Care Physician: [name] Other Clinician: Referring Physician: [name] Treating Physician/Extender: Frann Rider in Treatment: 0 Clinic Level of Care Assessment Items TOOL 1 Quantity Score []  - Use when EandM and Procedure is performed on INITIAL visit 0 ASSESSMENTS - Nursing Assessment / Reassessment X - General Physical Exam (combine w/ comprehensive assessment (listed just 1 20 below) when performed on new pt. evals) X - Comprehensive Assessment (HX, ROS, Risk Assessments, Wounds Hx, etc.) 1 25 ASSESSMENTS - Wound and Skin Assessment / Reassessment []  - Dermatologic / Skin Assessment (not related to wound area) 0 ASSESSMENTS - Ostomy and/or Continence Assessment and Care []  - Incontinence Assessment and Management 0 []  - Ostomy Care Assessment and Management (repouching, etc.) 0 PROCESS - Coordination of Care X - Simple Patient / Family Education for ongoing care 1 15 []  - Complex (extensive) Patient / Family Education for ongoing care 0 X - Staff obtains Consents, Records, Test Results / Process Orders 1 10 []  - Staff telephones HHA, Nursing Homes / Clarify orders / etc 0 []  - Routine Transfer to another Facility (non-emergent condition) 0 []  - Routine Hospital Admission (non-emergent condition) 0 X - New Admissions / Biomedical engineer / Ordering NPWT, Apligraf, etc. 1 15 []  - Emergency Hospital Admission (emergent condition) 0 PROCESS - Special Needs []  - Pediatric / Minor Patient Management 0 []  - Isolation Patient Management 0 Espin, Keenon H. ([id number]) []  - Hearing / Language / Visual special needs 0 []  - Assessment of Community assistance (transportation, D/C planning, etc.) 0 []  - Additional assistance / Altered mentation 0 []  - Support Surface(s) Assessment (bed, cushion, seat, etc.) 0 INTERVENTIONS - Miscellaneous []  - External ear exam  0 []  - Patient Transfer (multiple staff / Civil Service fast streamer / Similar devices) 0 []  - Simple Staple / Suture removal (25 or less) 0 []  - Complex Staple / Suture removal (26 or more) 0 []  - Hypo/Hyperglycemic Management (do not check if billed separately) 0 X - Ankle / Brachial Index (ABI) - do not check  if billed separately 1 15 Has the patient been seen at the hospital within the last three years: Yes Total Score: 100 Level Of Care: New/Established - Level 3 Electronic Signature(s) Signed: 03/26/2016 11:45:12 AM By: Gretta Cool, RN, BSN, Kim RN, BSN Entered By: Gretta Cool, RN, BSN, Kim on 03/25/2016 09:01:03 Baltic, [name] ([id number]) -------------------------------------------------------------------------------- Encounter Discharge Information Details Patient Name: [name], [name] Date of Service: 03/25/2016 8:00 AM Medical Record Number: [id number] Patient Account Number: [id number] Date of Birth/Sex: 1946-02-11 (70 y.o. Male) Treating RN: Cornell Barman Primary Care Physician: [name] Other Clinician: Referring Physician: [name] Treating Physician/Extender: Frann Rider in Treatment: 0 Encounter Discharge Information Items Schedule Follow-up Appointment: No Medication Reconciliation completed No and provided to Patient/Care Arianni Gallego: Provided on Clinical Summary of Care: 03/25/2016 Form Type Recipient Paper Patient CP Electronic Signature(s) Signed: 03/25/2016 9:00:10 AM By: Ruthine Dose Entered By: Ruthine Dose on 03/25/2016 09:00:10 Hotz, [name] ([id number]) -------------------------------------------------------------------------------- Lower Extremity Assessment Details Patient Name: [name], [name] Date of Service: 03/25/2016 8:00 AM Medical Record Number: [id number] Patient Account Number: [id number] Date of Birth/Sex: 08/07/1946 (70 y.o. Male) Treating RN: Cornell Barman Primary Care Physician: [name] Other Clinician: Referring  Physician: [name] Treating Physician/Extender: Frann Rider in Treatment: 0 Vascular Assessment Pulses: Posterior Tibial Palpable: [Right:Yes] Dorsalis Pedis Palpable: [Right:Yes] Doppler: [Right:Monophasic] Extremity colors, hair growth, and conditions: Extremity Color: [Right:Normal] Hair Growth on Extremity: [Right:Yes] Temperature of Extremity: [Right:Warm] Capillary Refill: [Right:> 3 seconds] Toe Nail Assessment Left: Right: Thick: No Discolored: No Deformed: No Improper Length and Hygiene: No Notes Non-compressible Electronic Signature(s) Signed: 03/26/2016 11:45:12 AM By: Gretta Cool, RN, BSN, Kim RN, BSN Entered By: Gretta Cool, RN, BSN, Kim on 03/25/2016 08:16:07 Caroline, [name] ([id number]) -------------------------------------------------------------------------------- Multi Wound Chart Details Patient Name: [name], [name] Date of Service: 03/25/2016 8:00 AM Medical Record Number: [id number] Patient Account Number: [id number] Date of Birth/Sex: June 11, 1946 (70 y.o. Male) Treating RN: Cornell Barman Primary Care Physician: [name] Other Clinician: Referring Physician: [name] Treating Physician/Extender: Frann Rider in Treatment: 0 Vital Signs Height(in): 69 Pulse(bpm): 65 Weight(lbs): 193.7 Blood Pressure 127/63 (mmHg): Body Mass Index(BMI): 29 Temperature(F): 97.6 Respiratory Rate 18 (breaths/min): Photos: [1:No Photos] [N/A:N/A] Wound Location: [1:Left Lower Leg - Medial, Anterior] [N/A:N/A] Wounding Event: [1:Gradually Appeared] [N/A:N/A] Primary Etiology: [1:Infection - not elsewhere classified] [N/A:N/A] Date Acquired: [1:09/25/2015] [N/A:N/A] Weeks of Treatment: [1:0] [N/A:N/A] Wound Status: [1:Open] [N/A:N/A] Measurements L x W x D 0.2x0.3x0.21 [N/A:N/A] (cm) Area (cm) : [1:0.047] [N/A:N/A] Volume (cm) : [1:0.01] [N/A:N/A] Classification: [1:Partial Thickness] [N/A:N/A] Exudate Amount: [1:Small]  [N/A:N/A] Exudate Type: [1:Serous] [N/A:N/A] Exudate Color: [1:amber] [N/A:N/A] Wound Margin: [1:Flat and Intact] [N/A:N/A] Granulation Amount: [1:Small (1-33%)] [N/A:N/A] Granulation Quality: [1:Pink] [N/A:N/A] Necrotic Amount: [1:None Present (0%)] [N/A:N/A] Exposed Structures: [1:Fascia: No Fat: No Tendon: No Muscle: No Joint: No Bone: No Limited to Skin Breakdown] [N/A:N/A] Periwound Skin Texture: [N/A:N/A] Edema: No Excoriation: No Induration: No Callus: No Crepitus: No Fluctuance: No Friable: No Rash: No Scarring: No Periwound Skin Maceration: No N/A N/A Moisture: Moist: No Dry/Scaly: No Periwound Skin Color: [name]: No N/A N/A Cyanosis: No Ecchymosis: No Erythema: No Hemosiderin Staining: No Mottled: No Pallor: No Rubor: No Tenderness on No N/A N/A Palpation: Wound Preparation: Ulcer Cleansing: N/A N/A Rinsed/Irrigated with Saline Topical Anesthetic Applied: None Treatment Notes Electronic Signature(s) Signed: 03/26/2016 11:45:12 AM By: Gretta Cool, RN, BSN, Kim RN, BSN Entered By: Gretta Cool, RN, BSN, Kim on 03/25/2016 08:28:45 Hauswirth, [name] ([id number]) -------------------------------------------------------------------------------- Multi-Disciplinary Care Plan Details Patient Name: Hynek, Faraz H. Date of  Service: 03/25/2016 8:00 AM Medical Record Number: [id number] Patient Account Number: [id number] Date of Birth/Sex: 09-21-1946 (70 y.o. Male) Treating RN: Cornell Barman Primary Care Physician: [name] Other Clinician: Referring Physician: [name] Treating Physician/Extender: Frann Rider in Treatment: 0 Active Inactive Nutrition Nursing Diagnoses: Impaired glucose control: actual or potential Goals: Patient/caregiver will maintain therapeutic glucose control Date Initiated: 03/25/2016 Goal Status: Active Interventions: Assess HgA1c results as ordered upon admission and as needed Notes: Orientation to the Wound  Care Program Nursing Diagnoses: Knowledge deficit related to the wound healing center program Goals: Patient/caregiver will verbalize understanding of the [facility] Date Initiated: 03/25/2016 Goal Status: Active Interventions: Provide education on orientation to the wound center Notes: Wound/Skin Impairment Nursing Diagnoses: Impaired tissue integrity Goals: Patient/caregiver will verbalize understanding of skin care regimen Date Initiated: 03/25/2016 AIRES, LEM ([id number]) Goal Status: Active Ulcer/skin breakdown will heal within 14 weeks Date Initiated: 03/25/2016 Goal Status: Active Interventions: Assess ulceration(s) every visit Notes: Electronic Signature(s) Signed: 03/26/2016 11:45:12 AM By: Gretta Cool, RN, BSN, Kim RN, BSN Entered By: Gretta Cool, RN, BSN, Kim on 03/25/2016 08:28:32 Himmelberger, [name] ([id number]) -------------------------------------------------------------------------------- Pain Assessment Details Patient Name: [name], [name] Date of Service: 03/25/2016 8:00 AM Medical Record Number: [id number] Patient Account Number: [id number] Date of Birth/Sex: [birth year] (70 y.o. Male) Treating RN: Cornell Barman Primary Care Physician: [name] Other Clinician: Referring Physician: [name] Treating Physician/Extender: Frann Rider in Treatment: 0 Active Problems Location of Pain Severity and Description of Pain Patient Has Paino No Site Locations Pain Management and Medication Current Pain Management: Electronic Signature(s) Signed: 03/26/2016 11:45:12 AM By: Gretta Cool, RN, BSN, Kim RN, BSN Entered By: Gretta Cool, RN, BSN, Kim on 03/25/2016 08:06:32 Bangs, [name] ([id number]) -------------------------------------------------------------------------------- Wound Assessment Details Patient Name: [name], [name] Date of Service: 03/25/2016 8:00 AM Medical Record Number: [id number] Patient Account Number:  [id number] Date of Birth/Sex: 04-26-1946 (70 y.o. Male) Treating RN: Cornell Barman Primary Care Physician: [name] Other Clinician: Referring Physician: [name] Treating Physician/Extender: Frann Rider in Treatment: 0 Wound Status Wound Number: 1 Primary Infection - not elsewhere classified Etiology: Wound Location: Left, Medial, Anterior Lower Leg Wound Status: Open Wounding Event: Gradually Appeared Comorbid Hypertension, Type I Diabetes, History: Osteoarthritis Date Acquired: 09/25/2015 Weeks Of Treatment: 0 Clustered Wound: No Photos Wound Measurements Length: (cm) 0.2 Width: (cm) 0.3 Depth: (cm) 0.2 Area: (cm) 0.047 Volume: (cm) 0.009 % Reduction in Area: 0% % Reduction in Volume: 10% Wound Description Full Thickness Without Exposed Classification: Support Structures Diabetic Severity Grade 2 (Wagner): Wound Margin: Flat and Intact Exudate Amount: Medium Exudate Type: Serosanguineous Exudate Color: red, brown Wound Bed Granulation Amount: Small (1-33%) Exposed Structure Granulation Quality: Pink Fascia Exposed: No [name], [name] ([id number]) Necrotic Amount: None Present (0%) Fat Layer Exposed: No Tendon Exposed: No Muscle Exposed: No Joint Exposed: No Bone Exposed: No Limited to Skin Breakdown Periwound Skin Texture Texture Color No Abnormalities Noted: No No Abnormalities Noted: No Callus: No [name]: No Crepitus: No Cyanosis: No Excoriation: No Ecchymosis: No Fluctuance: No Erythema: No Friable: No Hemosiderin Staining: No Induration: No Mottled: No Localized Edema: No Pallor: No Rash: No Rubor: No Scarring: No Moisture No Abnormalities Noted: No Dry / Scaly: No Maceration: No Moist: No Wound Preparation Ulcer Cleansing: Rinsed/Irrigated with Saline Topical Anesthetic Applied: None Electronic Signature(s) Signed: 04/04/2016 4:56:07 PM By: Gretta Cool, RN, BSN, Kim RN, BSN Previous Signature:  03/26/2016 11:45:12 AM Version By: Gretta Cool, RN, BSN, Kim RN, BSN Entered By: Gretta Cool, RN, BSN, Kim  on 04/01/2016 10:49:15 Rakes, [name] ([id number]) -------------------------------------------------------------------------------- Vitals Details Patient Name: [name], [name] Date of Service: 03/25/2016 8:00 AM Medical Record Number: [id number] Patient Account Number: [id number] Date of Birth/Sex: 1946/06/08 (70 y.o. Male) Treating RN: Cornell Barman Primary Care Physician: [name] Other Clinician: Referring Physician: [name] Treating Physician/Extender: Frann Rider in Treatment: 0 Vital Signs Time Taken: 08:06 Temperature (F): 97.6 Height (in): 69 Pulse (bpm): 65 Weight (lbs): 193.7 Respiratory Rate (breaths/min): 18 Body Mass Index (BMI): 28.6 Blood Pressure (mmHg): 127/63 Reference Range: 80 - 120 mg / dl Electronic Signature(s) Signed: 03/26/2016 11:45:12 AM By: Gretta Cool, RN, BSN, Kim RN, BSN Entered By: Gretta Cool, RN, BSN, Kim on 03/25/2016 08:07:46

## 2016-03-26 NOTE — Progress Notes (Signed)
BARETTA, ZIPKIN ([id number]) Visit Report for 03/25/2016 Abuse/Suicide Risk Screen Details Avery, Jadyn 03/25/2016 8:00 Patient Name: Date of Service: H. AM Medical Record Patient Account Number: [id number] Number: Treating RN: Cornell Barman Date of Birth/Sex: September 22, 1946 (70 y.o. Male) Other Clinician: Primary Care Physician: [name] Treating Christin Fudge Referring Physician: [name] Physician/Extender: Suella Grove in Treatment: 0 Abuse/Suicide Risk Screen Items Answer ABUSE/SUICIDE RISK SCREEN: Has anyone close to you tried to hurt or harm you recentlyo No Do you feel uncomfortable with anyone in your familyo No Has anyone forced you do things that you didnot want to doo No Do you have any thoughts of harming yourselfo No Patient displays signs or symptoms of abuse and/or neglect. No Electronic Signature(s) Signed: 03/26/2016 11:45:12 AM By: Gretta Cool, RN, BSN, Kim RN, BSN Entered By: Gretta Cool, RN, BSN, Kim on 03/25/2016 08:25:36 Tuttle, [name] ([id number]) -------------------------------------------------------------------------------- Activities of Daily Living Details Cleaves, Miking 03/25/2016 8:00 Patient Name: Date of Service: H. AM Medical Record Patient Account Number: [id number] Number: Treating RN: Cornell Barman Date of Birth/Sex: [birth year] (70 y.o. Male) Other Clinician: Primary Care Physician: [name] Treating Christin Fudge Referring Physician: [name] Physician/Extender: Suella Grove in Treatment: 0 Activities of Daily Living Items Answer Activities of Daily Living (Please select one for each item) Drive Automobile Completely Able Take Medications Completely Able Use Telephone Completely Able Care for Appearance Completely Able Use Toilet Completely Able Bath / Shower Completely Able Dress Self Completely Able Feed Self Completely Able Walk Completely Able Get In / Out Bed Completely Able Housework  Completely Able Prepare Meals Completely [place] for Self Completely Able Electronic Signature(s) Signed: 03/26/2016 11:45:12 AM By: Gretta Cool, RN, BSN, Kim RN, BSN Entered By: Gretta Cool, RN, BSN, Kim on 03/25/2016 08:25:45 [place], [name] ([id number]) -------------------------------------------------------------------------------- Education Assessment Details Petrow, Dimitry 03/25/2016 8:00 Patient Name: Date of Service: H. AM Medical Record Patient Account Number: [id number] Number: Treating RN: Cornell Barman Date of Birth/Sex: [birth year] (70 y.o. Male) Other Clinician: Primary Care Physician: Cristie Hem Referring Physician: [name] Physician/Extender: Weeks in Treatment: 0 Primary Learner Assessed: Patient Learning Preferences/Education Level/Primary Language Learning Preference: Explanation, Demonstration Highest Education Level: College or Above Preferred Language: English Cognitive Barrier Assessment/Beliefs Language Barrier: No Translator Needed: No Memory Deficit: No Emotional Barrier: No Cultural/Religious Beliefs Affecting Medical No Care: Physical Barrier Assessment Impaired Vision: No Impaired Hearing: No Decreased Hand dexterity: No Knowledge/Comprehension Assessment Knowledge Level: High Comprehension Level: High Ability to understand written High instructions: Ability to understand verbal High instructions: Motivation Assessment Anxiety Level: Calm Cooperation: Cooperative Education Importance: Acknowledges Need Interest in Health Problems: Asks Questions Perception: Coherent Willingness to Engage in Self- High Management Activities: Readiness to Engage in Self- High Management Activities: ARGIE, SILL ([id number]) Electronic Signature(s) Signed: 03/26/2016 11:45:12 AM By: Gretta Cool, RN, BSN, Kim RN, BSN Entered By: Gretta Cool, RN, BSN, Kim on 03/25/2016  08:26:18 Taylor, [name] ([id number]) -------------------------------------------------------------------------------- Fall Risk Assessment Details Murdo, [place] 03/25/2016 8:00 Patient Name: Date of Service: Lemmie Evens AM Medical Record Patient Account Number: [id number] Number: Treating RN: Cornell Barman Date of Birth/Sex: [birth year] (70 y.o. Male) Other Clinician: Primary Care Physician: Cristie Hem Referring Physician: [name] Physician/Extender: Weeks in Treatment: 0 Fall Risk Assessment Items Have you had 2 or more falls in the last 12 monthso 0 No Have you had any fall that resulted in injury in the last 12 monthso 0 No FALL RISK ASSESSMENT: History of falling - immediate or within  3 months 0 No Secondary diagnosis 0 No Ambulatory aid None/bed rest/wheelchair/nurse 0 Yes Crutches/cane/walker 0 No Furniture 0 No IV Access/Saline Lock 0 No Gait/Training Normal/bed rest/immobile 0 Yes Weak 0 No Impaired 0 No Mental Status Oriented to own ability 0 Yes Electronic Signature(s) Signed: 03/26/2016 11:45:12 AM By: Gretta Cool, RN, BSN, Kim RN, BSN Entered By: Gretta Cool, RN, BSN, Kim on 03/25/2016 08:26:35 Culton, [name] ([id number]) -------------------------------------------------------------------------------- Foot Assessment Details Sapulpa, Lavar 03/25/2016 8:00 Patient Name: Date of Service: H. AM Medical Record Patient Account Number: [id number] Number: Treating RN: Cornell Barman Date of Birth/Sex: 1946/03/15 (70 y.o. Male) Other Clinician: Primary Care Physician: Cristie Hem Referring Physician: [name] Physician/Extender: Weeks in Treatment: 0 Foot Assessment Items Site Locations + = Sensation present, - = Sensation absent, C = Callus, U = Ulcer R = Redness, W = Warmth, M = Maceration, PU = Pre-ulcerative lesion F = Fissure, S = Swelling, D = Dryness Assessment Right:  Left: Other Deformity: No No Prior Foot Ulcer: No No Prior Amputation: No No Charcot Joint: No No Ambulatory Status: Ambulatory Without Help Gait: Steady Electronic Signature(s) Signed: 03/26/2016 11:45:12 AM By: Gretta Cool, RN, BSN, Kim RN, BSN Entered By: Gretta Cool, RN, BSN, Kim on 03/25/2016 08:27:47 Hanken, [name] ([id number]) -------------------------------------------------------------------------------- Nutrition Risk Assessment Details [place], [place] 03/25/2016 8:00 Patient Name: Date of Service: H. AM Medical Record Patient Account Number: [id number] Number: Treating RN: Cornell Barman Date of Birth/Sex: [birth year] (70 y.o. Male) Other Clinician: Primary Care Physician: Cristie Hem Referring Physician: [name] Physician/Extender: Weeks in Treatment: 0 Height (in): 69 Weight (lbs): 193.7 Body Mass Index (BMI): 28.6 Nutrition Risk Assessment Items NUTRITION RISK SCREEN: I have an illness or condition that made me change the kind and/or 0 No amount of food I eat I eat fewer than two meals per day 0 No I eat few fruits and vegetables, or milk products 0 No I have three or more drinks of beer, liquor or wine almost every day 0 No I have tooth or mouth problems that make it hard for me to eat 0 No I don't always have enough money to buy the food I need 0 No I eat alone most of the time 0 No I take three or more different prescribed or over-the-counter drugs a 1 Yes day Without wanting to, I have lost or gained 10 pounds in the last six 0 No months I am not always physically able to shop, cook and/or feed myself 0 No Nutrition Protocols Good Risk Protocol 0 No interventions needed Moderate Risk Protocol Electronic Signature(s) Signed: 03/26/2016 11:45:12 AM By: Gretta Cool, RN, BSN, Kim RN, BSN Entered By: Gretta Cool, RN, BSN, Kim on 03/25/2016 [id number]

## 2016-03-27 LAB — SURGICAL PATHOLOGY

## 2016-03-30 DIAGNOSIS — J069 Acute upper respiratory infection, unspecified: Secondary | ICD-10-CM | POA: Diagnosis not present

## 2016-04-01 ENCOUNTER — Encounter: Payer: Commercial Managed Care - HMO | Admitting: Surgery

## 2016-04-01 DIAGNOSIS — I252 Old myocardial infarction: Secondary | ICD-10-CM | POA: Diagnosis not present

## 2016-04-01 DIAGNOSIS — I2510 Atherosclerotic heart disease of native coronary artery without angina pectoris: Secondary | ICD-10-CM | POA: Diagnosis not present

## 2016-04-01 DIAGNOSIS — L97211 Non-pressure chronic ulcer of right calf limited to breakdown of skin: Secondary | ICD-10-CM | POA: Diagnosis not present

## 2016-04-01 DIAGNOSIS — L97821 Non-pressure chronic ulcer of other part of left lower leg limited to breakdown of skin: Secondary | ICD-10-CM | POA: Diagnosis not present

## 2016-04-01 DIAGNOSIS — C44722 Squamous cell carcinoma of skin of right lower limb, including hip: Secondary | ICD-10-CM | POA: Diagnosis not present

## 2016-04-01 DIAGNOSIS — L97212 Non-pressure chronic ulcer of right calf with fat layer exposed: Secondary | ICD-10-CM | POA: Diagnosis not present

## 2016-04-01 DIAGNOSIS — C44712 Basal cell carcinoma of skin of right lower limb, including hip: Secondary | ICD-10-CM | POA: Diagnosis not present

## 2016-04-01 DIAGNOSIS — M722 Plantar fascial fibromatosis: Secondary | ICD-10-CM | POA: Diagnosis not present

## 2016-04-01 DIAGNOSIS — E10622 Type 1 diabetes mellitus with other skin ulcer: Secondary | ICD-10-CM | POA: Diagnosis not present

## 2016-04-01 DIAGNOSIS — I10 Essential (primary) hypertension: Secondary | ICD-10-CM | POA: Diagnosis not present

## 2016-04-01 DIAGNOSIS — Z87891 Personal history of nicotine dependence: Secondary | ICD-10-CM | POA: Diagnosis not present

## 2016-04-01 DIAGNOSIS — E785 Hyperlipidemia, unspecified: Secondary | ICD-10-CM | POA: Diagnosis not present

## 2016-04-01 NOTE — Progress Notes (Addendum)
CLEDIS, SMITHWICK ([id number]) Visit Report for 04/01/2016 Arrival Information Details McDougal, [place] 04/01/2016 10:45 Patient Name: Date of Service: H. AM Medical Record Patient Account Number: [id number] Number: Treating RN: Cornell Barman Date of Birth/Sex: 08-18-1946 (70 y.o. Male) Other Clinician: Primary Care Physician: [name] Treating Christin Fudge Referring Physician: [name] Physician/Extender: Suella Grove in Treatment: 1 Visit Information History Since Last Visit Added or deleted any medications: No Patient Arrived: Ambulatory Any new allergies or adverse reactions: No Arrival Time: 10:44 Had a fall or experienced change in No Accompanied By: self activities of daily living that may affect Transfer Assistance: None risk of falls: Patient Identification Verified: Yes Signs or symptoms of abuse/neglect since last No Secondary Verification Process Yes visito Completed: Hospitalized since last visit: No Patient Has Alerts: Yes Has Dressing in Place as Prescribed: Yes Patient Alerts: Patient on Blood Pain Present Now: No Thinner Clopidogrel Asoirin 81mg  ABI: Non- compress Electronic Signature(s) Signed: 04/01/2016 3:05:43 PM By: Gretta Cool, RN, BSN, Kim RN, BSN Entered By: Gretta Cool, RN, BSN, Kim on 04/01/2016 10:44:54 Greth, [name] ([id number]) -------------------------------------------------------------------------------- Clinic Level of Care Assessment Details [place], [place] 04/01/2016 10:45 Patient Name: Date of Service: H. AM Medical Record Patient Account Number: [id number] Number: Treating RN: Cornell Barman Date of Birth/Sex: 1945-11-23 (70 y.o. Male) Other Clinician: Primary Care Physician: [name] Treating Christin Fudge Referring Physician: [name] Physician/Extender: Suella Grove in Treatment: 1 Clinic Level of Care Assessment Items TOOL 4 Quantity Score []  - Use when only an EandM is performed on  FOLLOW-UP visit 0 ASSESSMENTS - Nursing Assessment / Reassessment X - Reassessment of Co-morbidities (includes updates in patient status) 1 10 X - Reassessment of Adherence to Treatment Plan 1 5 ASSESSMENTS - Wound and Skin Assessment / Reassessment X - Simple Wound Assessment / Reassessment - one wound 1 5 []  - Complex Wound Assessment / Reassessment - multiple wounds 0 []  - Dermatologic / Skin Assessment (not related to wound area) 0 ASSESSMENTS - Focused Assessment []  - Circumferential Edema Measurements - multi extremities 0 []  - Nutritional Assessment / Counseling / Intervention 0 []  - Lower Extremity Assessment (monofilament, tuning fork, pulses) 0 []  - Peripheral Arterial Disease Assessment (using hand held doppler) 0 ASSESSMENTS - Ostomy and/or Continence Assessment and Care []  - Incontinence Assessment and Management 0 []  - Ostomy Care Assessment and Management (repouching, etc.) 0 PROCESS - Coordination of Care X - Simple Patient / Family Education for ongoing care 1 15 []  - Complex (extensive) Patient / Family Education for ongoing care 0 []  - Staff obtains Programmer, systems, Records, Test Results / Process Orders 0 []  - Staff telephones HHA, Nursing Homes / Clarify orders / etc 0 Winograd, Braxdon H. ([id number]) []  - Routine Transfer to another Facility (non-emergent condition) 0 []  - Routine Hospital Admission (non-emergent condition) 0 []  - New Admissions / Biomedical engineer / Ordering NPWT, Apligraf, etc. 0 []  - Emergency Hospital Admission (emergent condition) 0 X - Simple Discharge Coordination 1 10 []  - Complex (extensive) Discharge Coordination 0 PROCESS - Special Needs []  - Pediatric / Minor Patient Management 0 []  - Isolation Patient Management 0 []  - Hearing / Language / Visual special needs 0 []  - Assessment of Community assistance (transportation, D/C planning, etc.) 0 []  - Additional assistance / Altered mentation 0 []  - Support Surface(s) Assessment (bed,  cushion, seat, etc.) 0 INTERVENTIONS - Wound Cleansing / Measurement X - Simple Wound Cleansing - one wound 1 5 []  - Complex Wound Cleansing - multiple wounds 0 X -  Wound Imaging (photographs - any number of wounds) 1 5 []  - Wound Tracing (instead of photographs) 0 X - Simple Wound Measurement - one wound 1 5 []  - Complex Wound Measurement - multiple wounds 0 INTERVENTIONS - Wound Dressings X - Small Wound Dressing one or multiple wounds 1 10 []  - Medium Wound Dressing one or multiple wounds 0 []  - Large Wound Dressing one or multiple wounds 0 []  - Application of Medications - topical 0 []  - Application of Medications - injection 0 Mires, Mohannad H. ([id number]) INTERVENTIONS - Miscellaneous []  - External ear exam 0 []  - Specimen Collection (cultures, biopsies, blood, body fluids, etc.) 0 []  - Specimen(s) / Culture(s) sent or taken to Lab for analysis 0 []  - Patient Transfer (multiple staff / Harrel Lemon Lift / Similar devices) 0 []  - Simple Staple / Suture removal (25 or less) 0 []  - Complex Staple / Suture removal (26 or more) 0 []  - Hypo / Hyperglycemic Management (close monitor of Blood Glucose) 0 []  - Ankle / Brachial Index (ABI) - do not check if billed separately 0 X - Vital Signs 1 5 Has the patient been seen at the hospital within the last three years: Yes Total Score: 75 Level Of Care: New/Established - Level 2 Electronic Signature(s) Signed: 04/01/2016 3:05:43 PM By: Gretta Cool, RN, BSN, Kim RN, BSN Entered By: Gretta Cool, RN, BSN, Kim on 04/01/2016 11:02:53 Sedita, [name] ([id number]) -------------------------------------------------------------------------------- Encounter Discharge Information Details Barnett, Jarreau 04/01/2016 10:45 Patient Name: Date of Service: Lemmie Evens AM Medical Record Patient Account Number: [id number] Number: Treating RN: Cornell Barman Date of Birth/Sex: Jan 29, 1946 (70 y.o. Male) Other Clinician: Primary Care Physician: [name]  Treating Christin Fudge Referring Physician: [name] Physician/Extender: Weeks in Treatment: 1 Encounter Discharge Information Items Schedule Follow-up Appointment: No Medication Reconciliation completed No and provided to Patient/Care Renatha Rosen: Provided on Clinical Summary of Care: 04/01/2016 Form Type Recipient Paper Patient CP Electronic Signature(s) Signed: 04/01/2016 11:03:14 AM By: Ruthine Dose Entered By: Ruthine Dose on 04/01/2016 11:03:14 Easterly, [name] ([id number]) -------------------------------------------------------------------------------- Lower Extremity Assessment Details Krieger, Tarez 04/01/2016 10:45 Patient Name: Date of Service: Lemmie Evens AM Medical Record Patient Account Number: [id number] Number: Treating RN: Cornell Barman Date of Birth/Sex: 01/29/1946 (70 y.o. Male) Other Clinician: Primary Care Physician: [name] Treating Christin Fudge Referring Physician: [name] Physician/Extender: Weeks in Treatment: 1 Vascular Assessment Pulses: Posterior Tibial Dorsalis Pedis Palpable: [Right:Yes] Extremity colors, hair growth, and conditions: Extremity Color: [Right:Normal] Hair Growth on Extremity: [Right:Yes] Temperature of Extremity: [Right:Warm] Capillary Refill: [Right:< 3 seconds] Toe Nail Assessment Left: Right: Thick: No Discolored: No Deformed: No Improper Length and Hygiene: No Electronic Signature(s) Signed: 04/01/2016 3:05:43 PM By: Gretta Cool, RN, BSN, Kim RN, BSN Entered By: Gretta Cool, RN, BSN, Kim on 04/01/2016 10:47:53 Poinsett, [name] ([id number]) -------------------------------------------------------------------------------- Multi Wound Chart Details Gagan, Yusef 04/01/2016 10:45 Patient Name: Date of Service: Lemmie Evens AM Medical Record Patient Account Number: [id number] Number: Treating RN: Cornell Barman Date of Birth/Sex: 14-Apr-1946 (70 y.o. Male) Other Clinician: Primary Care  Physician: Cristie Hem Referring Physician: [name] Physician/Extender: Weeks in Treatment: 1 Vital Signs Height(in): 69 Pulse(bpm): 68 Weight(lbs): 193.7 Blood Pressure 116/56 (mmHg): Body Mass Index(BMI): 29 Temperature(F): 98.2 Respiratory Rate 18 (breaths/min): Photos: [1:No Photos] [N/A:N/A] Wound Location: [1:Left Lower Leg - Medial, Anterior] [N/A:N/A] Wounding Event: [1:Gradually Appeared] [N/A:N/A] Primary Etiology: [1:Infection - not elsewhere classified] [N/A:N/A] Comorbid History: [1:Hypertension, Type I Diabetes, Osteoarthritis] [N/A:N/A] Date Acquired: [1:09/25/2015] [N/A:N/A] Weeks of Treatment: [1:1] [N/A:N/A] Wound Status: [1:Open] [N/A:N/A]  Measurements L x W x D 0.1x0.1x0.1 [N/A:N/A] (cm) Area (cm) : [1:0.008] [N/A:N/A] Volume (cm) : [1:0.001] [N/A:N/A] % Reduction in Area: [1:83.00%] [N/A:N/A] % Reduction in Volume: 90.00% [N/A:N/A] Classification: [1:Full Thickness Without Exposed Support Structures] [N/A:N/A] HBO Classification: [1:Grade 2] [N/A:N/A] Exudate Amount: [1:Medium] [N/A:N/A] Exudate Type: [1:Serosanguineous] [N/A:N/A] Exudate Color: [1:red, brown] [N/A:N/A] Wound Margin: [1:Flat and Intact] [N/A:N/A] Granulation Amount: [1:Large (67-100%)] [N/A:N/A] Granulation Quality: [1:Pink] [N/A:N/A] Necrotic Amount: [1:None Present (0%)] [N/A:N/A] Exposed Structures: Fascia: No N/A N/A Fat: No Tendon: No Muscle: No Joint: No Bone: No Limited to Skin Breakdown Epithelialization: Large (67-100%) N/A N/A Periwound Skin Texture: Edema: No N/A N/A Excoriation: No Induration: No Callus: No Crepitus: No Fluctuance: No Friable: No Rash: No Scarring: No Periwound Skin Maceration: No N/A N/A Moisture: Moist: No Dry/Scaly: No Periwound Skin Color: Atrophie Blanche: No N/A N/A Cyanosis: No Ecchymosis: No Erythema: No Hemosiderin Staining: No Mottled: No Pallor: No Rubor: No Tenderness on No N/A  N/A Palpation: Wound Preparation: Ulcer Cleansing: N/A N/A Rinsed/Irrigated with Saline Topical Anesthetic Applied: None Treatment Notes Electronic Signature(s) Signed: 04/01/2016 3:05:43 PM By: Gretta Cool, RN, BSN, Kim RN, BSN Entered By: Gretta Cool, RN, BSN, Kim on 04/01/2016 10:53:56 Filla, [name] ([id number]) -------------------------------------------------------------------------------- [place] Details [place], [place] 04/01/2016 10:45 Patient Name: Date of Service: H. AM Medical Record Patient Account Number: [id number] Number: Treating RN: Cornell Barman Date of Birth/Sex: 1946/05/27 (70 y.o. Male) Other Clinician: Primary Care Physician: Cristie Hem Referring Physician: [name] Physician/Extender: Suella Grove in Treatment: 1 Active Inactive Electronic Signature(s) Signed: 04/04/2016 1:16:05 PM By: Gretta Cool, RN, BSN, Kim RN, BSN Previous Signature: 04/01/2016 3:05:43 PM Version By: Gretta Cool, RN, BSN, Kim RN, BSN Entered By: Gretta Cool, RN, BSN, Kim on 04/04/2016 11:52:15 [place], [name] ([id number]) -------------------------------------------------------------------------------- Pain Assessment Details Cherry, Gunner 04/01/2016 10:45 Patient Name: Date of Service: Lemmie Evens AM Medical Record Patient Account Number: [id number] Number: Treating RN: Cornell Barman Date of Birth/Sex: 05-19-1946 (70 y.o. Male) Other Clinician: Primary Care Physician: [name] Treating Christin Fudge Referring Physician: [name] Physician/Extender: Weeks in Treatment: 1 Active Problems Location of Pain Severity and Description of Pain Patient Has Paino No Site Locations Pain Management and Medication Current Pain Management: Electronic Signature(s) Signed: 04/01/2016 3:05:43 PM By: Gretta Cool, RN, BSN, Kim RN, BSN Entered By: Gretta Cool, RN, BSN, Kim on 04/01/2016 10:45:02 Raysal, [name]  ([id number]) -------------------------------------------------------------------------------- Patient/Caregiver Education Details [place]isle, [place]ahan 04/01/2016 10:45 Patient Name: Date of Service: Lemmie Evens AM Medical Record Patient Account Number: [id number] Number: Treating RN: Cornell Barman Date of Birth/Gender: 23-Jul-1946 (70 y.o. Male) Other Clinician: Primary Care Physician: Cristie Hem Referring Physician: [name] Physician/Extender: Weeks in Treatment: 1 Education Assessment Education Provided To: Patient Education Topics Provided Wound/Skin Impairment: Handouts: Other: follow up with Surgeon Methods: Demonstration Responses: State content correctly Electronic Signature(s) Signed: 04/01/2016 3:05:43 PM By: Gretta Cool, RN, BSN, Kim RN, BSN Entered By: Gretta Cool, RN, BSN, Kim on 04/01/2016 11:03:45 [place], [name] ([id number]) -------------------------------------------------------------------------------- Wound Assessment Details Benton, Kelwin 04/01/2016 10:45 Patient Name: Date of Service: Lemmie Evens AM Medical Record Patient Account Number: [id number] Number: Treating RN: Cornell Barman Date of Birth/Sex: [birth year] (70 y.o. Male) Other Clinician: Primary Care Physician: Cristie Hem Referring Physician: [name] Physician/Extender: Weeks in Treatment: 1 Wound Status Wound Number: 1 Primary Infection - not elsewhere classified Etiology: Wound Location: Left Lower Leg - Medial, Anterior Wound Status: Open Wounding Event: Gradually Appeared Comorbid Hypertension, Type I Diabetes, History: Osteoarthritis Date Acquired: 09/25/2015 Weeks Of  Treatment: 1 Clustered Wound: No Photos Photo Uploaded By: Gretta Cool, RN, BSN, Kim on 04/01/2016 11:49:14 Wound Measurements Length: (cm) 0.1 Width: (cm) 0.1 Depth: (cm) 0.1 Area: (cm) 0.008 Volume: (cm) 0.001 % Reduction in Area: 83% % Reduction in  Volume: 90% Epithelialization: Large (67-100%) Tunneling: No Undermining: No Wound Description Full Thickness Without Exposed Classification: Support Structures Diabetic Severity Grade 2 (Wagner): Wound Margin: Flat and Intact Exudate Amount: Medium Exudate Type: Serosanguineous Exudate Color: red, brown Gelardi, Clary H. ([id number]) Wound Bed Granulation Amount: Large (67-100%) Exposed Structure Granulation Quality: Pink Fascia Exposed: No Necrotic Amount: None Present (0%) Fat Layer Exposed: No Tendon Exposed: No Muscle Exposed: No Joint Exposed: No Bone Exposed: No Limited to Skin Breakdown Periwound Skin Texture Texture Color No Abnormalities Noted: No No Abnormalities Noted: No Callus: No Atrophie Blanche: No Crepitus: No Cyanosis: No Excoriation: No Ecchymosis: No Fluctuance: No Erythema: No Friable: No Hemosiderin Staining: No Induration: No Mottled: No Localized Edema: No Pallor: No Rash: No Rubor: No Scarring: No Moisture No Abnormalities Noted: No Dry / Scaly: No Maceration: No Moist: No Wound Preparation Ulcer Cleansing: Rinsed/Irrigated with Saline Topical Anesthetic Applied: None Electronic Signature(s) Signed: 04/01/2016 3:05:43 PM By: Gretta Cool, RN, BSN, Kim RN, BSN Entered By: Gretta Cool, RN, BSN, Kim on 04/01/2016 10:48:54 [place], [name] ([id number]) -------------------------------------------------------------------------------- Vitals Details [place], [place] 04/01/2016 10:45 Patient Name: Date of Service: Lemmie Evens AM Medical Record Patient Account Number: [id number] Number: Treating RN: Cornell Barman Date of Birth/Sex: 1946-04-02 (70 y.o. Male) Other Clinician: Primary Care Physician: Cristie Hem Referring Physician: [name] Physician/Extender: Weeks in Treatment: 1 Vital Signs Time Taken: 10:45 Temperature (F): 98.2 Height (in): 69 Pulse (bpm): 68 Weight (lbs):  193.7 Respiratory Rate (breaths/min): 18 Body Mass Index (BMI): 28.6 Blood Pressure (mmHg): 116/56 Reference Range: 80 - 120 mg / dl Electronic Signature(s) Signed: 04/01/2016 3:05:43 PM By: Gretta Cool, RN, BSN, Kim RN, BSN Entered By: Gretta Cool, RN, BSN, Kim on 04/01/2016 10:45:31

## 2016-04-01 NOTE — Progress Notes (Addendum)
[name], [name] ([id number]) Visit Report for 04/01/2016 HPI Details Scrivner, Stepen 04/01/2016 10:45 Patient Name: Date of Service: H. AM Medical Record Patient Account Number: [id number] Number: Treating RN: Cornell Barman Date of Birth/Sex: 06/30/1946 (69 y.o. Male) Other Clinician: [place], [place], [place] Physician: Physician/Extender: Referring Physician: Payton Doughty in Treatment: 1 History of Present Illness Location: right anterior shin Quality: Patient reports No Pain. Severity: Patient states wound are getting worse. Duration: Patient has had the wound for > 6 months prior to seeking treatment at the wound center Context: The wound appeared gradually over time Modifying Factors: Other treatment(s) tried include: taken doxycycline and use bacitracin Associated Signs and Symptoms: Patient reports presence of swelling HPI Description: 70 year old male recently seen by his PCP Dr. [name] who saw him for a wound on the right leg which is less tender and he is continuing to use bacitracin and has completed her course of doxycycline. Past medical history of coronary artery disease, diabetes mellitus type 1, hyperlipidemia, hypertension, MI, plantar fascial fibromatosis, pneumonia, status post foot surgery due to trauma on the left side, coronary angioplasty. he is a former smoker and quit in July 1996. last hemoglobin A1c was 7.8 % in April of this year 5/22 2017 -- biopsy of the right lower extremity was sent for pathology and the report is that of a basal cell carcinoma with edges of the biopsy are involved. I have called the patient over the phone( 03/28/16) and discussed the pathology report with him and he is agreeable to see a surgeon for excision and need full. I have also spoken personally to Dr. [name], of [facility] surgical and referred the patient for further wide excision of this lesion and have communicated this to  the patient. Electronic Signature(s) Signed: 04/01/2016 11:00:56 AM By: Christin Fudge MD, FACS Previous Signature: 04/01/2016 10:52:39 AM Version By: Christin Fudge MD, FACS Entered By: Christin Fudge on 04/01/2016 11:00:55 Turgeon, [name] ([id number]) -------------------------------------------------------------------------------- Physical Exam Details Weingart, Kerolos 04/01/2016 10:45 Patient Name: Date of Service: H. AM Medical Record Patient Account Number: [id number] Number: Treating RN: Cornell Barman Date of Birth/Sex: 1946/05/03 (70 y.o. Male) Other Clinician: Primary Care Treating Daivd Council Physician: Physician/Extender: Referring Physician: Payton Doughty in Treatment: 1 Notes the actual lesion is about 1.2 x 1.4 cm x 0.3 cm with a central ulceration. The patient will need a wide excision of this lesion and was referred to a surgeon for this. Electronic Signature(s) Signed: 04/01/2016 11:02:40 AM By: Christin Fudge MD, FACS Entered By: Christin Fudge on 04/01/2016 11:02:40 Junco, [name] ([id number]) -------------------------------------------------------------------------------- Physician Orders Details Yawn, Kalin 04/01/2016 10:45 Patient Name: Date of Service: H. AM Medical Record Patient Account Number: [id number] Number: Treating RN: Cornell Barman Date of Birth/Sex: 1946/09/14 (70 y.o. Male) Other Clinician: Primary Care Treating Hedrick, Jhonnie Garner Physician: Physician/Extender: Referring Physician: Payton Doughty in Treatment: 1 Verbal / Phone Orders: Yes Clinician: Cornell Barman Read Back and Verified: Yes Diagnosis Coding Wound Cleansing Wound #1 Left,Medial,Anterior Lower Leg o Clean wound with Normal Saline. Primary Wound Dressing o Other: - non-stick pad Dressing Change Frequency Wound #1 Left,Medial,Anterior Lower Leg o Change dressing every other day. Follow-up  Appointments Wound #1 Left,Medial,Anterior Lower Leg o Return Appointment in 1 week. Edema Control Wound #1 Left,Medial,Anterior Lower Leg o Patient to wear own compression stockings Discharge From [facility] Wound #1 Left,Medial,Anterior Lower Leg o Discharge from [place] - follow-up with Dr. [name] with  [facility] will follow up with patient for surgical removal Electronic Signature(s) Signed: 04/01/2016 3:05:43 PM By: Gretta Cool RN, BSN, Kim RN, BSN Signed: 04/01/2016 3:39:56 PM By: Christin Fudge MD, FACS Entered By: Gretta Cool RN, BSN, Kim on 04/01/2016 10:59:52 Khatoon, [name] ([id number]) MERWIN, BOURGAULT ([id number]) -------------------------------------------------------------------------------- Problem List Details Dettinger, Anjel 04/01/2016 10:45 Patient Name: Date of Service: H. AM Medical Record Patient Account Number: [id number] Number: Treating RN: Cornell Barman Date of Birth/Sex: Jan 26, 1946 (70 y.o. Male) Other Clinician: Primary Care Treating Daivd Council Physician: Physician/Extender: Referring Physician: Payton Doughty in Treatment: 1 Active Problems ICD-10 Encounter Code Description Active Date Diagnosis E10.622 Type 1 diabetes mellitus with other skin ulcer 03/25/2016 Yes L97.212 Non-pressure chronic ulcer of right calf with fat layer 03/25/2016 Yes exposed C44.712 Basal cell carcinoma of skin of right lower limb, including 04/01/2016 Yes hip Inactive Problems ICD-10 Code Description Active Date Inactive Date C44.722 Squamous cell carcinoma of skin of right lower limb, 03/25/2016 03/25/2016 including hip Resolved Problems Electronic Signature(s) Signed: 04/01/2016 11:01:40 AM By: Christin Fudge MD, FACS Entered By: Christin Fudge on 04/01/2016 11:01:40 Sehili, [name]  ([id number]) -------------------------------------------------------------------------------- Progress Note Details Delfin, Jachai 04/01/2016 10:45 Patient Name: Date of Service: Lemmie Evens AM Medical Record Patient Account Number: [id number] Number: Treating RN: Cornell Barman Date of Birth/Sex: 09-Feb-1946 (70 y.o. Male) Other Clinician: Primary Care Treating Daivd Council Physician: Physician/Extender: Referring Physician: Payton Doughty in Treatment: 1 Subjective History of Present Illness (HPI) The following HPI elements were documented for the patient's wound: Location: right anterior shin Quality: Patient reports No Pain. Severity: Patient states wound are getting worse. Duration: Patient has had the wound for > 6 months prior to seeking treatment at the wound center Context: The wound appeared gradually over time Modifying Factors: Other treatment(s) tried include: taken doxycycline and use bacitracin Associated Signs and Symptoms: Patient reports presence of swelling 70 year old male recently seen by his PCP Dr. [name] who saw him for a wound on the right leg which is less tender and he is continuing to use bacitracin and has completed her course of doxycycline. Past medical history of coronary artery disease, diabetes mellitus type 1, hyperlipidemia, hypertension, MI, plantar fascial fibromatosis, pneumonia, status post foot surgery due to trauma on the left side, coronary angioplasty. he is a former smoker and quit in July 1996. last hemoglobin A1c was 7.8 % in April of this year 5/22 2017 -- biopsy of the right lower extremity was sent for pathology and the report is that of a basal cell carcinoma with edges of the biopsy are involved. I have called the patient over the phone( 03/28/16) and discussed the pathology report with him and he is agreeable to see a surgeon for excision and need full. I have also spoken personally to Dr. [name], of  [facility] surgical and referred the patient for further wide excision of this lesion and have communicated this to the patient. Objective Constitutional Vitals Time Taken: 10:45 AM, Height: 69 in, Weight: 193.7 lbs, BMI: 28.6, Temperature: 98.2 F, Pulse: 68 bpm, Respiratory Rate: 18 breaths/min, Blood Pressure: 116/56 mmHg. GRECO, PURSLEY ([id number]) Integumentary (Hair, Skin) Wound #1 status is Open. Original cause of wound was Gradually Appeared. The wound is located on the Left,Medial,Anterior Lower Leg. The wound measures 0.1cm length x 0.1cm width x 0.1cm depth; 0.008cm^2 area and 0.001cm^3 volume. The wound is limited to skin breakdown. There is no tunneling or undermining noted.  There is a medium amount of serosanguineous drainage noted. The wound margin is flat and intact. There is large (67-100%) pink granulation within the wound bed. There is no necrotic tissue within the wound bed. The periwound skin appearance did not exhibit: Callus, Crepitus, Excoriation, Fluctuance, Friable, Induration, Localized Edema, Rash, Scarring, Dry/Scaly, Maceration, Moist, Atrophie Blanche, Cyanosis, Ecchymosis, Hemosiderin Staining, Mottled, Pallor, Rubor, Erythema. Assessment Active Problems ICD-10 E10.622 - Type 1 diabetes mellitus with other skin ulcer L97.212 - Non-pressure chronic ulcer of right calf with fat layer exposed C44.712 - Basal cell carcinoma of skin of right lower limb, including hip The patient has had biopsy-proven basal cell carcinoma and have recommended a surgical consultation for wide excision and closure. The actual lesion is about 1.2 x 1.4 cm x 0.3 cm with a central ulceration. The patient will need a wide excision of this lesion and was referred to a surgeon for this. He has been given in July surgical office number and he will communicate with them. Plan Wound Cleansing: Wound #1 Left,Medial,Anterior Lower Leg: Clean wound with Normal Saline. Primary Wound  Dressing: Other: - non-stick pad Dressing Change Frequency: Wound #1 Left,Medial,Anterior Lower Leg: Change dressing every other day. Follow-up Appointments: Wound #1 Left,Medial,Anterior Lower Leg: Return Appointment in 1 week. Edema Control: Wound #1 Left,Medial,Anterior Lower Leg: Patient to wear own compression stockings Sauls, Kentrell H. ([id number]) Discharge From [facility]: Wound #1 Left,Medial,Anterior Lower Leg: Discharge from [place] - follow-up with Dr. [name] with [facility] Consults ordered were: [facility] will follow up with patient for surgical removal The patient has had biopsy-proven basal cell carcinoma and have recommended a surgical consultation for wide excision and closure. The actual lesion is about 1.2 x 1.4 cm x 0.3 cm with a central ulceration. The patient will need a wide excision of this lesion and was referred to a surgeon for this. He has been given in July surgical office number and he will communicate with them. Electronic Signature(s) Signed: 04/01/2016 11:03:45 AM By: Christin Fudge MD, FACS Entered By: Christin Fudge on 04/01/2016 11:03:44 Funches, [name] ([id number]) -------------------------------------------------------------------------------- SuperBill Details Patient Name: Conrow, Kaynan H. Date of Service: 04/01/2016 Medical Record Number: [id number] Patient Account Number: [id number] Date of Birth/Sex: 1945-11-18 (70 y.o. Male) Treating RN: Cornell Barman Primary Care Physician: [name] Other Clinician: Referring Physician: [name] Treating Physician/Extender: Frann Rider in Treatment: 1 Diagnosis Coding ICD-10 Codes Code Description [phone] Type 1 diabetes mellitus with other skin ulcer L97.212 Non-pressure chronic ulcer of right calf with fat layer exposed C44.712 Basal cell carcinoma of skin of right lower limb, including hip Facility Procedures CPT4 Code:  [id number] Description: [phone] - WOUND CARE VISIT-LEV 2 EST PT Modifier: Quantity: 1 Physician Procedures CPT4 Code: [id number] Description: [id number] - WC PHYS LEVEL 3 - EST PT ICD-10 Description Diagnosis E10.622 Type 1 diabetes mellitus with other skin ulcer L97.212 Non-pressure chronic ulcer of right calf with fat C44.712 Basal cell carcinoma of skin of right lower limb, Modifier: layer exposed including hip Quantity: 1 Electronic Signature(s) Signed: 04/01/2016 11:03:56 AM By: Christin Fudge MD, FACS Entered By: Christin Fudge on 04/01/2016 11:03:56

## 2016-04-05 ENCOUNTER — Encounter: Payer: Self-pay | Admitting: Surgery

## 2016-04-05 ENCOUNTER — Telehealth: Payer: Self-pay

## 2016-04-05 ENCOUNTER — Other Ambulatory Visit: Payer: Self-pay

## 2016-04-05 ENCOUNTER — Ambulatory Visit (INDEPENDENT_AMBULATORY_CARE_PROVIDER_SITE_OTHER): Payer: Commercial Managed Care - HMO | Admitting: Surgery

## 2016-04-05 VITALS — BP 128/73 | HR 75 | Temp 97.8°F | Ht 69.0 in | Wt 195.0 lb

## 2016-04-05 DIAGNOSIS — C44711 Basal cell carcinoma of skin of unspecified lower limb, including hip: Secondary | ICD-10-CM | POA: Insufficient documentation

## 2016-04-05 DIAGNOSIS — C44712 Basal cell carcinoma of skin of right lower limb, including hip: Secondary | ICD-10-CM

## 2016-04-05 NOTE — Telephone Encounter (Signed)
Patient message was sent to Dr. Rockey Situ to give [place] cardiac clearance for the Anesthesiologist. Awaiting for his response.

## 2016-04-05 NOTE — Progress Notes (Signed)
Subjective:     Patient ID: [name], male   DOB: Sep 12, 1946, 70 y.o.   MRN: [id number]  HPI  70 yr old male who comes in today with Right lower leg basal cell carcinoma that was biopsied by his primary care physician. Patient was seen by Dr. Con Memos at the wound care center because he did get some infection in the area who recommended surgical excision of the lesion. The patient has had basal cell cancers removed mainly from his chest in the past. The first one he had removed was in his 70s. Patient states that when he was younger he did have multiple blistering sunburns and did spend some time in the tanning bed as well. The patient states that now he uses sunscreen and attempts to stay out of the sun.  He states that his lesion area is now healing and is keeping a Band-Aid over the area. Patient has not had any bleeding from this area. Patient did have a heart attack about 8 years ago when he had some stents placed at that time and he has been on Plavix since then. Patient is followed by Dr. Rockey Situ with cardiology.  I also asked the patient about the lesion on his left lower lip which has been there for about 4 years and started out as seeming like a blood blister in the area however it has started to get a little bit darker and has not gone away over time.  The patient also has some peripheral vascular disease and weak pulses but she knows about in his lower extremities. The patient also has had diabetes since he was 70 years old and is about to switch to a insulin pump. The patient states that his A1c's have been in the 7 range and last one was 7.6.   Past Medical History  Diagnosis Date  . Coronary artery disease   . Ischemic cardiomyopathy   . Diabetes mellitus   . Hyperlipidemia   . Hypertension   . Myocardial infarction ([place])   . Edema   . Hypoglycemia     history of episodes x2  . Arthritis     right knee  . Cancer ([place])     skin cancer  . Heart attack ([place])     7-8 yrs ago    Past Surgical History  Procedure Laterality Date  . Cardiac catheterization    . Coronary stent placement      To an occluded LAD  . Skin cancer excision      chest   Family History  Problem Relation Age of Onset  . Heart disease Mother   . Heart attack Mother   . Heart disease Father   . Prostate cancer Brother    Social History   Social History  . Marital Status: Single    Spouse Name: N/A  . Number of Children: N/A  . Years of Education: N/A   Occupational History  . Retired    Social History Main Topics  . Smoking status: Former Smoker    Quit date: 06/08/1995  . Smokeless tobacco: Never Used  . Alcohol Use: No  . Drug Use: No  . Sexual Activity: Not Asked   Other Topics Concern  . None   Social History Narrative   Pt gets regular exercise.    Current outpatient prescriptions:  .  ACCU-CHEK AVIVA PLUS test strip, , Disp: , Rfl: 0 .  aspirin 81 MG tablet, Take 4 tablets (325 mg total) by mouth  daily., Disp: , Rfl:  .  atorvastatin (LIPITOR) 80 MG tablet, take 1 tablet by mouth at bedtime, Disp: 90 tablet, Rfl: 3 .  B-D INS SYRINGE 0.5CC/31GX5/16 31G X 5/16" 0.5 ML MISC, , Disp: , Rfl: 0 .  carvedilol (COREG) 12.5 MG tablet, take 1 tablet by mouth twice a day WITH FOOD, Disp: 180 tablet, Rfl: 1 .  clopidogrel (PLAVIX) 75 MG tablet, take 1 tablet by mouth once daily, Disp: 90 tablet, Rfl: 6 .  furosemide (LASIX) 20 MG tablet, take 1 tablet by mouth once daily, Disp: 90 tablet, Rfl: 3 .  glucagon (GLUCAGON EMERGENCY) 1 MG injection, , Disp: , Rfl:  .  insulin glargine (LANTUS) 100 UNIT/ML injection, Inject 25 Units into the skin daily. , Disp: , Rfl:  .  insulin lispro protamine-lispro (HUMALOG 50/50) (50-50) 100 UNIT/ML SUSP injection, Inject 4 Units into the skin 2 (two) times daily before a meal., Disp: , Rfl:  .  losartan (COZAAR) 50 MG tablet, Take 25 mg by mouth daily., Disp: , Rfl:  .  multivitamin (THERAGRAN) per tablet, Take 1 tablet by mouth daily.   , Disp: , Rfl:  .  nitroGLYCERIN (NITROSTAT) 0.4 MG SL tablet, Place 0.4 mg under the tongue every 5 (five) minutes as needed. Do not exceed 3 doses in 15 minutes. , Disp: , Rfl:  .  NOVOLOG 100 UNIT/ML injection, inject 2 to 10 units three times a day before meals as directed, Disp: , Rfl: 0 .  vitamin B-12 (CYANOCOBALAMIN) 1000 MCG tablet, Take 1,000 mcg by mouth daily.  , Disp: , Rfl:  No Known Allergies    Review of Systems  Constitutional: Negative for fever, chills, activity change, appetite change and fatigue.  HENT: Negative for congestion and sore throat.   Cardiovascular: Positive for leg swelling. Negative for chest pain and palpitations.  Gastrointestinal: Negative for nausea, vomiting, abdominal pain, diarrhea, constipation and abdominal distention.  Genitourinary: Negative for dysuria and hematuria.  Musculoskeletal: Negative for back pain and joint swelling.  Skin: Positive for wound.  Neurological: Negative for dizziness and weakness.  Hematological: Negative for adenopathy. Does not bruise/bleed easily.  Psychiatric/Behavioral: Negative for agitation. The patient is not nervous/anxious.   All other systems reviewed and are negative.      Filed Vitals:   04/05/16 1410  BP: 128/73  Pulse: 75  Temp: 97.8 F (36.6 C)    Objective:   Physical Exam  Constitutional: He is oriented to person, place, and time. He appears well-developed and well-nourished. No distress.  HENT:  Head: Normocephalic and atraumatic.  Right Ear: External ear normal.  Left Ear: External ear normal.  Nose: Nose normal.  Mouth/Throat: Oropharynx is clear and moist. No oropharyngeal exudate.  Eyes: Conjunctivae and EOM are normal. Pupils are equal, round, and reactive to light. No scleral icterus.  Neck: Normal range of motion. Neck supple. No tracheal deviation present.  Cardiovascular: Normal rate, regular rhythm, normal heart sounds and intact distal pulses.  Exam reveals no gallop and no  friction rub.   No murmur heard. Pulmonary/Chest: Effort normal and breath sounds normal. No respiratory distress. He has no wheezes. He has no rales. He exhibits no tenderness.  Abdominal: Soft. Bowel sounds are normal. He exhibits no distension. There is no tenderness. There is no rebound.  Musculoskeletal: Normal range of motion. He exhibits no edema or tenderness.  Neurological: He is alert and oriented to person, place, and time. No cranial nerve deficit.  Skin: Skin is warm and  dry.  Right lower leg with a 1 cm area of previous biopsy some hyperemic changes around the area but no erythema or signs of infection, of note the hair stops mid calf on his leg and has decreased 1+ pulses in his DP with 1+ pitting edema bilaterally  He does have multiple areas of lesion excision along his chest was have scarred and healed well and one on his neck from a previous sebaceous cyst excision.  Left lower lip with 3 mm area of dark blue to gray lesion, no cervical lymphadenopathy.  Small basal cell in his left nasolabial fold.    Some small actinic keratosis along his back however no other lesions that look like basal cell carcinoma or melanoma noted  Psychiatric: He has a normal mood and affect. His behavior is normal. Judgment and thought content normal.  Vitals reviewed.  Pathology: basal cell carcinoma with positive margin     Assessment:     70 yr old male with right lower leg basal cell carcinoma    Plan:     I reviewed the patient's past medical history including his well-controlled hypertension and his diabetes. I have reviewed his past laboratory values and noted his past A1c 7.6 range. I have personally reviewed his pathology results that are above showed the basal cell carcinoma. I discussed with the patient and given this area along the anterior tibial region this notoriously difficult area to heal afterwards and prone to infections and chronic wound healing. I also discussed the patient  that given both his diabetes and his vascular disease of this area very well could be difficult to heal. I also discussed with the patient that we would attempt to do a layered wound closure over this area to attempt to keep approximation and healing is best as possible. I also discussed with the patient that given that he is on the aspirin and Plavix we could do this procedure continued on low-dose as long as we did it in the operating room where electrocautery was available. I discussed the risk and benefits with the patient to include bleeding infection nonhealing of the wound need for further procedures to excise tissue as well as anesthetic risk. I recommended the patient have some Mac and local in order to adequately excise and close this area. The patient was given the opportunity to ask questions and have them answered and he is in agreement with right lower leg lesion excision to be done next week.

## 2016-04-09 ENCOUNTER — Telehealth: Payer: Self-pay | Admitting: Cardiovascular Disease

## 2016-04-09 NOTE — Telephone Encounter (Signed)
Pt advised of pre op date/time and sx date. Sx: 04/11/16 with Dr Loflin--Excision of basal cell carcinoma on right leg. Pre op: 04/10/16 @ 11:45am--Office.   Patient made aware to call [phone], between 1-3:00pm the day before surgery, to find out what time to arrive.

## 2016-04-09 NOTE — Telephone Encounter (Signed)
Received cardiac clearance request for pt to proceed w/ lesion excision on rt lower leg on 04/11/16 w/ Dr. Azalee Course @ [facility]. They would like recommendations on holding pt's Plavix & aspirin prior to procedure.  Pt's last appt was on 02/23/15, he was sched for 1 year f/u on 03/14/16, but this was delayed to due Dr. Rockey Situ being out of the office. He is currently sched for 04/24/16. Request placed on Dr. Donivan Scull desk for review.

## 2016-04-10 ENCOUNTER — Encounter
Admission: RE | Admit: 2016-04-10 | Discharge: 2016-04-10 | Disposition: A | Discharge status: Home / Self Care | Payer: Commercial Managed Care - HMO | Source: Ambulatory Visit | Attending: Surgery | Admitting: Surgery

## 2016-04-10 ENCOUNTER — Telehealth: Payer: Self-pay

## 2016-04-10 DIAGNOSIS — Z01810 Encounter for preprocedural cardiovascular examination: Secondary | ICD-10-CM | POA: Insufficient documentation

## 2016-04-10 DIAGNOSIS — Z01812 Encounter for preprocedural laboratory examination: Secondary | ICD-10-CM | POA: Diagnosis not present

## 2016-04-10 DIAGNOSIS — I25719 Atherosclerosis of autologous vein coronary artery bypass graft(s) with unspecified angina pectoris: Secondary | ICD-10-CM | POA: Diagnosis not present

## 2016-04-10 HISTORY — DX: Gastro-esophageal reflux disease without esophagitis: K21.9

## 2016-04-10 LAB — COMPREHENSIVE METABOLIC PANEL
ALT: 20 U/L (ref 17–63)
AST: 22 U/L (ref 15–41)
Albumin: 3.8 g/dL (ref 3.5–5.0)
Alkaline Phosphatase: 60 U/L (ref 38–126)
Anion gap: 7 (ref 5–15)
BILIRUBIN TOTAL: 0.8 mg/dL (ref 0.3–1.2)
BUN: 16 mg/dL (ref 6–20)
CHLORIDE: 105 mmol/L (ref 101–111)
CO2: 29 mmol/L (ref 22–32)
CREATININE: 0.84 mg/dL (ref 0.61–1.24)
Calcium: 9.1 mg/dL (ref 8.9–10.3)
GFR calc Af Amer: >60 mL/min (ref >60)
Glucose, Bld: 163 mg/dL — ABNORMAL HIGH (ref 65–99)
Potassium: 4.3 mmol/L (ref 3.5–5.1)
Sodium: 141 mmol/L (ref 135–145)
TOTAL PROTEIN: 6.1 g/dL — AB (ref 6.5–8.1)

## 2016-04-10 LAB — CBC WITH DIFFERENTIAL/PLATELET
BASOS ABS: 0.1 10*3/uL (ref 0–0.1)
Basophils Relative: 1 %
Eosinophils Absolute: 0.3 10*3/uL (ref 0–0.7)
Eosinophils Relative: 5 %
HEMATOCRIT: 36.9 % — AB (ref 40.0–52.0)
HEMOGLOBIN: 12.6 g/dL — AB (ref 13.0–18.0)
LYMPHS PCT: 22 %
Lymphs Abs: 1.7 10*3/uL (ref 1.0–3.6)
MCH: 31 pg (ref 26.0–34.0)
MCHC: 34.1 g/dL (ref 32.0–36.0)
MCV: 90.7 fL (ref 80.0–100.0)
Monocytes Absolute: 1 10*3/uL (ref 0.2–1.0)
Monocytes Relative: 12 %
NEUTROS ABS: 4.6 10*3/uL (ref 1.4–6.5)
Neutrophils Relative %: 60 %
Platelets: 262 10*3/uL (ref 150–440)
RBC: 4.07 MIL/uL — AB (ref 4.40–5.90)
RDW: 13.6 % (ref 11.5–14.5)
WBC: 7.7 10*3/uL (ref 3.8–10.6)

## 2016-04-10 NOTE — Pre-Procedure Instructions (Signed)
Dr. Azalee Course office made aware of patient being seen at walk-in clinic a week ago for symptoms of a cold and patient stated he was told he had a upper respiratory infection, Amber at Dr. Azalee Course office  notified.

## 2016-04-10 NOTE — Telephone Encounter (Signed)
Spoke with Baker Janus from [employer]. She informs me that patient was seen at [facility] in the last week for an upper respiratory infection. She states patient is afebrile, nonproductive cough, and was not placed on any antibiotic for this. I explained that we would need clearance from his PCP prior to rescheduling surgery. Awaiting records from [facility] at this time.  She also states that patient has continued to take Plavix as he was not told anything differently. Looking into chart, this was not addressed until this morning by cardiologist so surgery will indeed need to be rescheduled. We do have clearance to hold Plavix for 5 days.   Please cancel surgery with OR and notify surgeon.  I have called patient to notify him that surgery has been cancelled and we will call with new surgery date.

## 2016-04-10 NOTE — Telephone Encounter (Signed)
Routed to [facility] [phone].

## 2016-04-10 NOTE — Telephone Encounter (Signed)
I have called the [facility] and canceled patient's surgery for 04/11/16 with Dr Azalee Course as requested. I have also contacted Dr Azalee Course by e-mail to advise her as well. When patient is ready to reschedule please advise.

## 2016-04-10 NOTE — Telephone Encounter (Signed)
Acceptable risk for procedure Would stay on aspirin On Plavix 5 days prior to procedure

## 2016-04-10 NOTE — Patient Instructions (Signed)
  Your procedure is scheduled [id number] 1, 2017 (Thursday) Report to Same Day Surgery 2nd floor [facility] To find out your arrival time please call [phone] between 1PM - 3PM on ARRIVAL TIME 6:00 AM  Remember: Instructions that are not followed completely may result in serious medical risk, up to and including death, or upon the discretion of your surgeon and anesthesiologist your surgery may need to be rescheduled.    _x___ 1. Do not eat food or drink liquids after midnight. No gum chewing or hard candies.     __ 2. No Alcohol for 24 hours before or after surgery.   ____ 3. Bring all medications with you on the day of surgery if instructed.    __x__ 4. Notify your doctor if there is any change in your medical condition     (cold, fever, infections).     Do not wear jewelry, make-up, hairpins, clips or nail polish.  Do not wear lotions, powders, or perfumes. You may wear deodorant.  Do not shave 48 hours prior to surgery. Men may shave face and neck.  Do not bring valuables to the hospital.    [facility] is not responsible for any belongings or valuables.               Contacts, dentures or bridgework may not be worn into surgery.  Leave your suitcase in the car. After surgery it may be brought to your room.  For patients admitted to the hospital, discharge time is determined by your treatment team.   Patients discharged the day of surgery will not be allowed to drive home.    Please read over the following fact sheets that you were given:   [facility] Preparing for Surgery and or MRSA Information   _x___ Take these medicines the morning of surgery with A SIP OF WATER:    1. Carvedilol  2.Losartan  3.  4.  5.  6.  ____ Fleet Enema (as directed)   _x___ Use CHG Soap or sage wipes as directed on instruction sheet   ____ Use inhalers on the day of surgery and bring to hospital day of surgery  ____ Stop metformin 2 days prior to surgery    ____ Take 1/2 of usual  insulin dose the night before surgery and none on the morning of surgery (Take one-half of Lantus at bedtime tonight and no insulin the morning of surgery)       _x___ Stop aspirin or coumadin, or plavix  (Do not take Aspirin or Plavix tomorrow morning)  _x__ Stop Anti-inflammatories such as Advil, Aleve, Ibuprofen, Motrin, Naproxen,          Naprosyn, Goodies powders or aspirin products. Ok to take Tylenol.   __x__ Stop supplements until after surgery.  (Stop vitamin B-12 now)  ____ Bring C-Pap to the hospital.

## 2016-04-10 NOTE — Pre-Procedure Instructions (Signed)
DR Johnny Bridge NOTE FROM 07/09/13 NOTES EKG WITH OLD ANTERIOR INFARCT.

## 2016-04-11 ENCOUNTER — Encounter: Admission: RE | Payer: Self-pay | Source: Ambulatory Visit

## 2016-04-11 ENCOUNTER — Ambulatory Visit: Admission: RE | Admit: 2016-04-11 | Payer: Commercial Managed Care - HMO | Source: Ambulatory Visit | Admitting: Surgery

## 2016-04-11 DIAGNOSIS — J208 Acute bronchitis due to other specified organisms: Secondary | ICD-10-CM | POA: Diagnosis not present

## 2016-04-11 DIAGNOSIS — B9689 Other specified bacterial agents as the cause of diseases classified elsewhere: Secondary | ICD-10-CM | POA: Diagnosis not present

## 2016-04-11 SURGERY — LESION EXCISION WITH COMPLEX REPAIR
Anesthesia: Monitor Anesthesia Care | Laterality: Right

## 2016-04-14 ENCOUNTER — Other Ambulatory Visit: Payer: Self-pay | Admitting: Cardiovascular Disease

## 2016-04-17 ENCOUNTER — Telehealth: Payer: Self-pay

## 2016-04-17 NOTE — Telephone Encounter (Signed)
Spoke with Caryl Pina at [facility] regarding records showing Upper Respiratory Infection. Date of Service at Urgent Care was- 03/30/16 per Medical Assistant. She is faxing records regarding patient's visit at this time.

## 2016-04-18 NOTE — Telephone Encounter (Signed)
Office Notes from this visit obtained and reviewed. I will review with Dr. Azalee Course in regards to when to reschedule surgery and if she would indeed need clearance since there was not prescriptions/ Antibiotics given to patient at visit. I will update after this discussion.  Awaiting notification from Dr. Azalee Course at this time.

## 2016-04-23 NOTE — Telephone Encounter (Signed)
I have spoke with patient and made him an appointment with Dr Azalee Course on 05/10/16. Patient states that he is feeling much better.   He is making an appointment with is Mickle Plumb, MD next week once he finished his antibiotic. He has an appointment with Dr Rockey Situ (Cardiology) on 04/24/16. An appointment with [facility] for labs per his endocrinologist on 05/09/16 and a follow up appointment with his endocrinologist.

## 2016-04-23 NOTE — Telephone Encounter (Signed)
Patient does not need clearance. However, he will need a return appointment so that Dr. Azalee Course can assess him prior to rescheduling surgery. Please place patient on schedule the next time Dr. Azalee Course is in clinic.

## 2016-04-23 NOTE — Telephone Encounter (Signed)
Noted! Thank you

## 2016-04-24 ENCOUNTER — Encounter: Payer: Self-pay | Admitting: Cardiovascular Disease

## 2016-04-24 ENCOUNTER — Ambulatory Visit (INDEPENDENT_AMBULATORY_CARE_PROVIDER_SITE_OTHER): Payer: Commercial Managed Care - HMO | Admitting: Cardiovascular Disease

## 2016-04-24 VITALS — BP 120/60 | HR 65 | Ht 69.0 in | Wt 189.8 lb

## 2016-04-24 DIAGNOSIS — E785 Hyperlipidemia, unspecified: Secondary | ICD-10-CM | POA: Diagnosis not present

## 2016-04-24 DIAGNOSIS — E1159 Type 2 diabetes mellitus with other circulatory complications: Secondary | ICD-10-CM

## 2016-04-24 DIAGNOSIS — I2510 Atherosclerotic heart disease of native coronary artery without angina pectoris: Secondary | ICD-10-CM | POA: Diagnosis not present

## 2016-04-24 DIAGNOSIS — I10 Essential (primary) hypertension: Secondary | ICD-10-CM | POA: Diagnosis not present

## 2016-04-24 NOTE — Progress Notes (Signed)
Patient ID: [name], male   DOB: [birth year], 70 y.o.   MRN: [id number] Cardiology Office Note  Date:  04/24/2016   ID:  [name], DOB [birth year], MRN [id number]  PCP:  [name], MD   Chief Complaint  Patient presents with  . other    1 yr f/u. Meds reviewed verbally with pt.    HPI:  [name] is a very pleasant 70 year old gentleman with a history of coronary artery disease, occluded LAD with stent placed February 2008 ( Cypher 3.0 x 8 mm DES stent), also with poorly controlled diabetes, hypertension who presents for routine followup Of his coronary artery disease  On his last clinic visit he had back pain radiating to his left arm, He was seen by urgent care, started on prednisone taper. Symptoms resolved, no further episodes Also reported occasional tingling down his left arm, ulnar region.  He is active, manages a horse farm, denies any significant symptoms of shortness of breath, chest discomfort Hemoglobin A1c 7.8, total cholesterol 135, LDL 73 Getting over upper respiratory tract infection 2 weeks ago, took antibiotics He sees endocrine through [facility] for his diabetes  EKG on today's visit showing normal sinus rhythm rate 65 bpm old anterior MI, T-wave abnormality,  Other past medical history He is active on a horse farm.  His previous angina was described as posterior neck pain.  On previous visits, he reported having night sweats. We held his carvedilol and Lipitor. Night sweats did not improve. told he had infection in his gums and has had significant work done. night sweats have improved    Cardiac catheter report from 2008 details 25% ostial left main, 100% mid LAD with stent placed at this location,40% proximal circumflex, 20% PL vessel disease Previous stress test showing scar in the mid to distal anterior wall. Previous lab work; Hemoglobin A1c 8.2, total cholesterol 134, LDL 65  PMH:   has a past medical history of  Coronary artery disease; Ischemic cardiomyopathy; Diabetes mellitus; Hyperlipidemia; Hypertension; Myocardial infarction ([place]); Edema; Hypoglycemia; Arthritis; Cancer ([place]); Heart attack ([place]); and GERD (gastroesophageal reflux disease).  PSH:    Past Surgical History  Procedure Laterality Date  . Cardiac catheterization    . Coronary stent placement      To an occluded LAD  . Skin cancer excision      chest  . Coronary angioplasty      Current Outpatient Prescriptions  Medication Sig Dispense Refill  . ACCU-CHEK AVIVA PLUS test strip   0  . aspirin 81 MG tablet Take 81 mg by mouth daily.     Marland Kitchen atorvastatin (LIPITOR) 80 MG tablet take 1 tablet by mouth at bedtime 90 tablet 3  . B-D INS SYRINGE 0.5CC/31GX5/16 31G X 5/16" 0.5 ML MISC   0  . carvedilol (COREG) 12.5 MG tablet take 1 tablet by mouth twice a day with food 180 tablet 3  . clopidogrel (PLAVIX) 75 MG tablet take 1 tablet by mouth once daily 90 tablet 6  . furosemide (LASIX) 20 MG tablet take 1 tablet by mouth once daily 90 tablet 3  . glucagon (GLUCAGON EMERGENCY) 1 MG injection     . insulin glargine (LANTUS) 100 UNIT/ML injection Inject 20 Units into the skin every morning.     . insulin glargine (LANTUS) 100 UNIT/ML injection Inject 5 Units into the skin at bedtime.    Marland Kitchen losartan (COZAAR) 50 MG tablet Take 25 mg by mouth daily.    . multivitamin (THERAGRAN) per  tablet Take 1 tablet by mouth daily.      . nitroGLYCERIN (NITROSTAT) 0.4 MG SL tablet Place 0.4 mg under the tongue every 5 (five) minutes as needed. Do not exceed 3 doses in 15 minutes.     Marland Kitchen NOVOLOG 100 UNIT/ML injection inject 2 to 10 units three times a day before meals as directed  0  . vitamin B-12 (CYANOCOBALAMIN) 1000 MCG tablet Take 1,000 mcg by mouth daily.       No current facility-administered medications for this visit.     Allergies:   Review of patient's allergies indicates no known allergies.   Social History:  The patient  reports that he quit  smoking about 20 years ago. His smoking use included Cigarettes. He smoked 1.00 pack per day. He has never used smokeless tobacco. He reports that he does not drink alcohol or use illicit drugs.   Family History:   family history includes Heart attack in his mother; Heart disease in his father and mother; Prostate cancer in his brother.    Review of Systems: Review of Systems  Constitutional: Negative.   Respiratory: Negative.   Cardiovascular: Negative.   Gastrointestinal: Negative.   Musculoskeletal: Negative.   Neurological: Negative.   Psychiatric/Behavioral: Negative.   All other systems reviewed and are negative.    PHYSICAL EXAM: VS:  BP 120/60 mmHg  Pulse 65  Ht 5\' 9"  (1.753 m)  Wt 189 lb 12 oz (86.07 kg)  BMI 28.01 kg/m2 , BMI Body mass index is 28.01 kg/(m^2). GEN: Well nourished, well developed, in no acute distress HEENT: normal Neck: no JVD, carotid bruits, or masses Cardiac: RRR; no murmurs, rubs, or gallops,no edema  Respiratory:  clear to auscultation bilaterally, normal work of breathing GI: soft, nontender, nondistended, + BS MS: no deformity or atrophy Skin: warm and dry, no rash Neuro:  Strength and sensation are intact Psych: euthymic mood, full affect    Recent Labs: 04/10/2016: ALT 20; BUN 16; Creatinine, Ser 0.84; Hemoglobin 12.6*; Platelets 262; Potassium 4.3; Sodium 141    Lipid Panel Lab Results  Component Value Date   CHOL 146 01/03/2011   HDL 53 01/03/2011   LDLCALC 79 01/03/2011   TRIG 70 01/03/2011      Wt Readings from Last 3 Encounters:  04/24/16 189 lb 12 oz (86.07 kg)  04/10/16 193 lb (87.544 kg)  04/05/16 195 lb (88.451 kg)       ASSESSMENT AND PLAN:  Atherosclerosis of native coronary artery of native heart without angina pectoris - Plan: EKG 12-Lead Currently with no symptoms of angina. No further workup at this time. Continue current medication regimen.  HYPERTENSION, BENIGN - Plan: EKG 12-Lead Blood pressure is  well controlled on today's visit. No changes made to the medications.  Hyperlipidemia Cholesterol is at goal on the current lipid regimen. No changes to the medications were made.  Controlled type 2 diabetes mellitus with other circulatory complication, without long-term current use of insulin ([name]) Long discussion concerning his hemoglobin A1c 7.8 He has a new meter and feels this will help him control his sugar level better He has had diabetes for 50 years  Disposition:   F/U  12 months   Orders Placed This Encounter  Procedures  . EKG 12-Lead    Total encounter time more than 15 minutes  Greater than 50% was spent in counseling and coordination of care with the patient   Signed, Esmond Plants, M.D., Ph.D. 04/24/2016  [place], [place]

## 2016-04-24 NOTE — Patient Instructions (Signed)
You are doing well. No medication changes were made.  Please call us if you have new issues that need to be addressed before your next appt.  Your physician wants you to follow-up in: 12 months.  You will receive a reminder letter in the mail two months in advance. If you don't receive a letter, please call our office to schedule the follow-up appointment. 

## 2016-05-08 DIAGNOSIS — E109 Type 1 diabetes mellitus without complications: Secondary | ICD-10-CM | POA: Diagnosis not present

## 2016-05-10 ENCOUNTER — Ambulatory Visit: Payer: Self-pay | Admitting: Surgery

## 2016-05-15 DIAGNOSIS — E109 Type 1 diabetes mellitus without complications: Secondary | ICD-10-CM | POA: Diagnosis not present

## 2016-05-17 DIAGNOSIS — I10 Essential (primary) hypertension: Secondary | ICD-10-CM | POA: Diagnosis not present

## 2016-05-17 DIAGNOSIS — Z125 Encounter for screening for malignant neoplasm of prostate: Secondary | ICD-10-CM | POA: Diagnosis not present

## 2016-05-17 DIAGNOSIS — Z0000 Encounter for general adult medical examination without abnormal findings: Secondary | ICD-10-CM | POA: Diagnosis not present

## 2016-05-17 DIAGNOSIS — E109 Type 1 diabetes mellitus without complications: Secondary | ICD-10-CM | POA: Diagnosis not present

## 2016-05-20 ENCOUNTER — Ambulatory Visit (INDEPENDENT_AMBULATORY_CARE_PROVIDER_SITE_OTHER): Payer: Commercial Managed Care - HMO | Admitting: Surgery

## 2016-05-20 ENCOUNTER — Encounter: Payer: Self-pay | Admitting: Surgery

## 2016-05-20 VITALS — BP 136/72 | HR 71 | Temp 97.7°F | Ht 69.0 in | Wt 194.0 lb

## 2016-05-20 DIAGNOSIS — C44712 Basal cell carcinoma of skin of right lower limb, including hip: Secondary | ICD-10-CM | POA: Diagnosis not present

## 2016-05-20 NOTE — Progress Notes (Signed)
Subjective:     Patient ID: [name], male   DOB: [birth year], 70 y.o.   MRN: [id number]  HPI  70 yr old male who comes in today with Right lower leg basal cell carcinoma that was biopsied by his primary care physician. Patient was seen by Dr. Con Memos at the wound care center because he did get some infection in the area who recommended surgical excision of the lesion. The patient has had basal cell cancers removed mainly from his chest in the past. The first one he had removed was in his 49s. Patient states that when he was younger he did have multiple blistering sunburns and did spend some time in the tanning bed as well. The patient states that now he uses sunscreen and attempts to stay out of the sun. He states that his lesion area is now healing and is keeping a Band-Aid over the area. Patient has not had any bleeding from this area. Patient did have a heart attack about 8 years ago when he had some stents placed at that time and he has been on Plavix since then. Patient is followed by Dr. Rockey Situ with cardiology. I also asked the patient about the lesion on his left lower lip which has been there for about 4 years and started out as seeming like a blood blister in the area however it has started to get a little bit darker and has not gone away over time.  He was set up to have this done but had an Upper respiratory infection and was canceled. Patient states that since that time he has been on antibiotics and started feeling better at about day 3 states that he no longer has any cough or sputum production is fully healed from this.  He denies any fever, chills, malaise, shortness of breath, chest pain, coughing, sputum production, abdominal pain, nausea, vomiting, diarrhea, constipation or dysuria.  Past Medical History  Diagnosis Date  . Coronary artery disease   . Ischemic cardiomyopathy   . Diabetes mellitus   . Hyperlipidemia   . Hypertension   . Myocardial infarction ([place])   . Edema    . Hypoglycemia     history of episodes x2  . Arthritis     right knee  . Cancer ([place])     skin cancer  . Heart attack ([place])     7-8 yrs ago  . GERD (gastroesophageal reflux disease)    Past Surgical History  Procedure Laterality Date  . Cardiac catheterization    . Coronary stent placement      To an occluded LAD  . Skin cancer excision      chest  . Coronary angioplasty     Family History  Problem Relation Age of Onset  . Heart disease Mother   . Heart attack Mother   . Heart disease Father   . Prostate cancer Brother    Social History   Social History  . Marital Status: Single    Spouse Name: N/A  . Number of Children: N/A  . Years of Education: N/A   Occupational History  . Retired    Social History Main Topics  . Smoking status: Former Smoker -- 1.00 packs/day    Types: Cigarettes    Quit date: 06/08/1995  . Smokeless tobacco: Never Used  . Alcohol Use: No  . Drug Use: No  . Sexual Activity: Not Asked   Other Topics Concern  . None   Social History Narrative   Pt  gets regular exercise.    Current outpatient prescriptions:  .  ACCU-CHEK AVIVA PLUS test strip, , Disp: , Rfl: 0 .  aspirin 81 MG tablet, Take 81 mg by mouth daily. , Disp: , Rfl:  .  atorvastatin (LIPITOR) 80 MG tablet, take 1 tablet by mouth at bedtime, Disp: 90 tablet, Rfl: 3 .  B-D INS SYRINGE 0.5CC/31GX5/16 31G X 5/16" 0.5 ML MISC, , Disp: , Rfl: 0 .  carvedilol (COREG) 12.5 MG tablet, take 1 tablet by mouth twice a day with food, Disp: 180 tablet, Rfl: 3 .  clopidogrel (PLAVIX) 75 MG tablet, take 1 tablet by mouth once daily, Disp: 90 tablet, Rfl: 6 .  furosemide (LASIX) 20 MG tablet, take 1 tablet by mouth once daily, Disp: 90 tablet, Rfl: 3 .  glucagon (GLUCAGON EMERGENCY) 1 MG injection, , Disp: , Rfl:  .  insulin glargine (LANTUS) 100 UNIT/ML injection, Inject 20 Units into the skin every morning. , Disp: , Rfl:  .  insulin glargine (LANTUS) 100 UNIT/ML injection, Inject 5 Units  into the skin at bedtime., Disp: , Rfl:  .  losartan (COZAAR) 50 MG tablet, Take 25 mg by mouth daily., Disp: , Rfl:  .  multivitamin (THERAGRAN) per tablet, Take 1 tablet by mouth daily.  , Disp: , Rfl:  .  nitroGLYCERIN (NITROSTAT) 0.4 MG SL tablet, Place 0.4 mg under the tongue every 5 (five) minutes as needed. Do not exceed 3 doses in 15 minutes. , Disp: , Rfl:  .  NOVOLOG 100 UNIT/ML injection, inject 2 to 10 units three times a day before meals as directed, Disp: , Rfl: 0 .  vitamin B-12 (CYANOCOBALAMIN) 1000 MCG tablet, Take 1,000 mcg by mouth daily.  , Disp: , Rfl:  No Known Allergies     Review of Systems  Constitutional: Negative for fever, chills, activity change, fatigue and unexpected weight change.  HENT: Positive for sinus pressure. Negative for congestion and sneezing.   Respiratory: Negative for cough, chest tightness, shortness of breath, wheezing and stridor.   Cardiovascular: Negative for chest pain, palpitations and leg swelling.  Gastrointestinal: Negative for abdominal pain, constipation, blood in stool and abdominal distention.  Genitourinary: Negative for dysuria and hematuria.  Musculoskeletal: Negative for gait problem.  Skin: Positive for wound. Negative for color change, pallor and rash.  Neurological: Negative for dizziness, tremors and weakness.  Hematological: Negative for adenopathy. Does not bruise/bleed easily.  Psychiatric/Behavioral: Negative for agitation. The patient is not nervous/anxious.   All other systems reviewed and are negative.      Filed Vitals:   05/20/16 1329  BP: 136/72  Pulse: 71  Temp: 97.7 F (36.5 C)     Objective:   Physical Exam  Constitutional: He is oriented to person, place, and time. He appears well-developed and well-nourished. No distress.  HENT:  Head: Normocephalic and atraumatic.  Right Ear: External ear normal.  Left Ear: External ear normal.  Nose: Nose normal.  Mouth/Throat: Oropharynx is clear and moist.  No oropharyngeal exudate.  Eyes: Conjunctivae and EOM are normal. Pupils are equal, round, and reactive to light. No scleral icterus.  Neck: Normal range of motion. Neck supple. No tracheal deviation present.  Cardiovascular: Normal rate, regular rhythm, normal heart sounds and intact distal pulses.  Exam reveals no gallop and no friction rub.   No murmur heard. Pulmonary/Chest: Effort normal and breath sounds normal. No respiratory distress. He has no wheezes. He has no rales.  Abdominal: Soft. Bowel sounds are normal. He  exhibits no distension. There is no tenderness. There is no rebound.  Musculoskeletal: Normal range of motion. He exhibits no edema or tenderness.  Neurological: He is alert and oriented to person, place, and time. He has normal reflexes.  Skin: Skin is warm and dry. He is not diaphoretic.  Right lower leg with a 1 cm area of previous biopsy some hyperemic changes around the area but no erythema or signs of infection, of note the hair stops mid calf on his leg and has decreased 1+ pulses in his DP with 1+ pitting edema bilaterally  He does have multiple areas of lesion excision along his chest was have scarred and healed well and one on his neck from a previous sebaceous cyst excision.  Left lower lip with 3 mm area of dark blue to gray lesion, no cervical lymphadenopathy. Small basal cell in his left nasolabial fold.   Some small actinic keratosis along his back however no other lesions that look like basal cell carcinoma or melanoma noted   Psychiatric: He has a normal mood and affect. His behavior is normal. Judgment and thought content normal.  Vitals reviewed.      Assessment:     Right lower leg, basal cell carcinoma    Plan:     I reviewed the patient's past medical history including his well-controlled hypertension and his diabetes. I have reviewed his past laboratory values and noted his past A1c 7.6 range. I have personally reviewed his pathology results  that are above showed the basal cell carcinoma. I discussed with the patient and given this area along the anterior tibial region this notoriously difficult area to heal afterwards and prone to infections and chronic wound healing. I also discussed the patient that given both his diabetes and his vascular disease of this area very well could be difficult to heal. I also discussed with the patient that we would attempt to do a layered wound closure over this area to attempt to keep approximation and healing is best as possible. I also discussed with the patient that given that he is on the aspirin and Plavix we could do this procedure continued on low-dose as long as we did it in the operating room where electrocautery was available. I discussed the risk and benefits with the patient to include bleeding infection nonhealing of the wound need for further procedures to excise tissue as well as anesthetic risk. I recommended the patient have some Mac and local in order to adequately excise and close this area. The patient was given the opportunity to ask questions and have them answered and he is in agreement with right lower leg lesion excision to be done in the next couple of weeks.

## 2016-05-20 NOTE — Patient Instructions (Signed)
Please call our office if you have any questions or concerns. Please see Blue pre-care sheet.

## 2016-05-22 ENCOUNTER — Telehealth: Payer: Self-pay | Admitting: Surgery

## 2016-05-22 NOTE — Telephone Encounter (Signed)
Pt advised of pre op date/time and sx date. Sx: 06/05/16 with Dr Loflin--Excision of Lesion of Right lower leg. Pre op: 05/27/16 between 9-1:00pm--Phone.  Patient made aware to call [phone], between 1-3:00pm the day before surgery, to find out what time to arrive.

## 2016-05-23 DIAGNOSIS — E108 Type 1 diabetes mellitus with unspecified complications: Secondary | ICD-10-CM | POA: Diagnosis not present

## 2016-05-23 DIAGNOSIS — I10 Essential (primary) hypertension: Secondary | ICD-10-CM | POA: Diagnosis not present

## 2016-05-23 DIAGNOSIS — Z0000 Encounter for general adult medical examination without abnormal findings: Secondary | ICD-10-CM | POA: Diagnosis not present

## 2016-05-23 DIAGNOSIS — E785 Hyperlipidemia, unspecified: Secondary | ICD-10-CM | POA: Diagnosis not present

## 2016-05-27 ENCOUNTER — Other Ambulatory Visit: Payer: Self-pay

## 2016-05-27 ENCOUNTER — Encounter: Payer: Self-pay | Admitting: *Deleted

## 2016-05-27 NOTE — Patient Instructions (Signed)
  Your procedure is scheduled on: 06-05-16 Report to Same Day Surgery 2nd floor medical mall To find out your arrival time please call [phone] between 1PM - 3PM on 06-04-16  Remember: Instructions that are not followed completely may result in serious medical risk, up to and including death, or upon the discretion of your surgeon and anesthesiologist your surgery may need to be rescheduled.    _x___ 1. Do not eat food or drink liquids after midnight. No gum chewing or hard candies.     __x__ 2. No Alcohol for 24 hours before or after surgery.   __x__3. No Smoking for 24 prior to surgery.   ____  4. Bring all medications with you on the day of surgery if instructed.    __x__ 5. Notify your doctor if there is any change in your medical condition     (cold, fever, infections).     Do not wear jewelry, make-up, hairpins, clips or nail polish.  Do not wear lotions, powders, or perfumes. You may wear deodorant.  Do not shave 48 hours prior to surgery. Men may shave face and neck.  Do not bring valuables to the hospital.    [facility] is not responsible for any belongings or valuables.               Contacts, dentures or bridgework may not be worn into surgery.  Leave your suitcase in the car. After surgery it may be brought to your room.  For patients admitted to the hospital, discharge time is determined by your treatment team.   Patients discharged the day of surgery will not be allowed to drive home.    Please read over the following fact sheets that you were given:   [facility] Preparing for Surgery and or MRSA Information   _x___ Take these medicines the morning of surgery with A SIP OF WATER:    1. losartan  2. carvedilol  3.  4.  5.  6.  ____ Fleet Enema (as directed)   ____ Use CHG Soap or sage wipes as directed on instruction sheet   ____ Use inhalers on the day of surgery and bring to hospital day of surgery  ____ Stop metformin 2 days prior to  surgery    ____ Take 1/2 of usual insulin dose the night before surgery and none on the morning of  surgery.   _x___ Stop aspirin or coumadin, or plavix-ok to continue aspirin and plavix per office-do not take am of surgery   _x__ Stop Anti-inflammatories such as Advil, Aleve, Ibuprofen, Motrin, Naproxen,          Naprosyn, Goodies powders or aspirin products. Ok to take Tylenol.   ____ Stop supplements until after surgery.    ____ Bring C-Pap to the hospital.

## 2016-05-27 NOTE — Pre-Procedure Instructions (Signed)
[name] Merritts, MD Signed [name] Merritts, MD 04/24/2016 8:51 AM    Progress Notes    Expand All Collapse All   Patient ID: [name], male DOB: Jun 04, 1946, 70 y.o. MRN: [id number] Cardiology Office Note  Date: 04/24/2016   ID: [name], DOB 10-22-1946, MRN [id number]  PCP: [name] Pink, MD  Chief Complaint  Patient presents with  . other    1 yr f/u. Meds reviewed verbally with pt.    HPI:  [name] is a very pleasant 70 year old gentleman with a history of coronary artery disease, occluded LAD with stent placed February 2008 ( Cypher 3.0 x 8 mm DES stent), also with poorly controlled diabetes, hypertension who presents for routine followup Of his coronary artery disease  On his last clinic visit he had back pain radiating to his left arm, He was seen by urgent care, started on prednisone taper. Symptoms resolved, no further episodes Also reported occasional tingling down his left arm, ulnar region.  He is active, manages a horse farm, denies any significant symptoms of shortness of breath, chest discomfort Hemoglobin A1c 7.8, total cholesterol 135, LDL 73 Getting over upper respiratory tract infection 2 weeks ago, took antibiotics He sees endocrine through [facility] for his diabetes  EKG on today's visit showing normal sinus rhythm rate 65 bpm old anterior MI, T-wave abnormality,  Other past medical history He is active on a horse farm.  His previous angina was described as posterior neck pain.  On previous visits, he reported having night sweats. We held his carvedilol and Lipitor. Night sweats did not improve. told he had infection in his gums and has had significant work done. night sweats have improved    Cardiac catheter report from 2008 details 25% ostial left main, 100% mid LAD with stent placed at this location,40% proximal circumflex, 20% PL vessel disease Previous stress test showing scar in the mid to  distal anterior wall. Previous lab work; Hemoglobin A1c 8.2, total cholesterol 134, LDL 65  PMH:  has a past medical history of Coronary artery disease; Ischemic cardiomyopathy; Diabetes mellitus; Hyperlipidemia; Hypertension; Myocardial infarction ([place]); Edema; Hypoglycemia; Arthritis; Cancer ([place]); Heart attack ([place]); and GERD (gastroesophageal reflux disease).  PSH:  Past Surgical History  Procedure Laterality Date  . Cardiac catheterization    . Coronary stent placement      To an occluded LAD  . Skin cancer excision      chest  . Coronary angioplasty      Current Outpatient Prescriptions  Medication Sig Dispense Refill  . ACCU-CHEK AVIVA PLUS test strip   0  . aspirin 81 MG tablet Take 81 mg by mouth daily.     Marland Kitchen atorvastatin (LIPITOR) 80 MG tablet take 1 tablet by mouth at bedtime 90 tablet 3  . B-D INS SYRINGE 0.5CC/31GX5/16 31G X 5/16" 0.5 ML MISC   0  . carvedilol (COREG) 12.5 MG tablet take 1 tablet by mouth twice a day with food 180 tablet 3  . clopidogrel (PLAVIX) 75 MG tablet take 1 tablet by mouth once daily 90 tablet 6  . furosemide (LASIX) 20 MG tablet take 1 tablet by mouth once daily 90 tablet 3  . glucagon (GLUCAGON EMERGENCY) 1 MG injection     . insulin glargine (LANTUS) 100 UNIT/ML injection Inject 20 Units into the skin every morning.     . insulin glargine (LANTUS) 100 UNIT/ML injection Inject 5 Units into the skin at bedtime.    Marland Kitchen losartan (COZAAR) 50  MG tablet Take 25 mg by mouth daily.    . multivitamin (THERAGRAN) per tablet Take 1 tablet by mouth daily.     . nitroGLYCERIN (NITROSTAT) 0.4 MG SL tablet Place 0.4 mg under the tongue every 5 (five) minutes as needed. Do not exceed 3 doses in 15 minutes.     Marland Kitchen NOVOLOG 100 UNIT/ML injection inject 2 to 10 units three times a day before meals as directed  0  . vitamin B-12 (CYANOCOBALAMIN) 1000 MCG  tablet Take 1,000 mcg by mouth daily.      No current facility-administered medications for this visit.     Allergies: Review of patient's allergies indicates no known allergies.   Social History: The patient  reports that he quit smoking about 20 years ago. His smoking use included Cigarettes. He smoked 1.00 pack per day. He has never used smokeless tobacco. He reports that he does not drink alcohol or use illicit drugs.   Family History: family history includes Heart attack in his mother; Heart disease in his father and mother; Prostate cancer in his brother.    Review of Systems: Review of Systems  Constitutional: Negative.  Respiratory: Negative.  Cardiovascular: Negative.  Gastrointestinal: Negative.  Musculoskeletal: Negative.  Neurological: Negative.  Psychiatric/Behavioral: Negative.  All other systems reviewed and are negative.    PHYSICAL EXAM: VS: BP 120/60 mmHg  Pulse 65  Ht 5\' 9"  (1.753 m)  Wt 189 lb 12 oz (86.07 kg)  BMI 28.01 kg/m2 , BMI Body mass index is 28.01 kg/(m^2). GEN: Well nourished, well developed, in no acute distress  HEENT: normal  Neck: no JVD, carotid bruits, or masses Cardiac: RRR; no murmurs, rubs, or gallops,no edema  Respiratory: clear to auscultation bilaterally, normal work of breathing GI: soft, nontender, nondistended, + BS MS: no deformity or atrophy  Skin: warm and dry, no rash Neuro: Strength and sensation are intact Psych: euthymic mood, full affect    Recent Labs: 04/10/2016: ALT 20; BUN 16; Creatinine, Ser 0.84; Hemoglobin 12.6*; Platelets 262; Potassium 4.3; Sodium 141    Lipid Panel  Recent Labs    Lab Results  Component Value Date   CHOL 146 01/03/2011   HDL 53 01/03/2011   LDLCALC 79 01/03/2011   TRIG 70 01/03/2011       Wt Readings from Last 3 Encounters:  04/24/16 189 lb 12 oz (86.07 kg)  04/10/16 193 lb (87.544 kg)  04/05/16 195 lb (88.451 kg)        ASSESSMENT AND PLAN:  Atherosclerosis of native coronary artery of native heart without angina pectoris - Plan: EKG 12-Lead Currently with no symptoms of angina. No further workup at this time. Continue current medication regimen.  HYPERTENSION, BENIGN - Plan: EKG 12-Lead Blood pressure is well controlled on today's visit. No changes made to the medications.  Hyperlipidemia Cholesterol is at goal on the current lipid regimen. No changes to the medications were made.  Controlled type 2 diabetes mellitus with other circulatory complication, without long-term current use of insulin ([place]) Long discussion concerning his hemoglobin A1c 7.8 He has a new meter and feels this will help him control his sugar level better He has had diabetes for 50 years  Disposition: F/U 12 months   Orders Placed This Encounter  Procedures  . EKG 12-Lead   Total encounter time more than 15 minutes Greater than 50% was spent in counseling and coordination of care with the patient   Signed, Esmond Plants, M.D., Ph.D. 04/24/2016  [place]  [place], [place]

## 2016-06-05 ENCOUNTER — Ambulatory Visit: Payer: Commercial Managed Care - HMO | Admitting: Anesthesiology

## 2016-06-05 ENCOUNTER — Encounter
Admission: RE | Disposition: A | Discharge status: Home / Self Care | Payer: Self-pay | Source: Ambulatory Visit | Attending: Surgery

## 2016-06-05 ENCOUNTER — Ambulatory Visit
Admission: RE | Admit: 2016-06-05 | Discharge: 2016-06-05 | Disposition: A | Discharge status: Home / Self Care | Payer: Commercial Managed Care - HMO | Source: Ambulatory Visit | Attending: Surgery | Admitting: Surgery

## 2016-06-05 DIAGNOSIS — I255 Ischemic cardiomyopathy: Secondary | ICD-10-CM | POA: Diagnosis not present

## 2016-06-05 DIAGNOSIS — I2510 Atherosclerotic heart disease of native coronary artery without angina pectoris: Secondary | ICD-10-CM | POA: Insufficient documentation

## 2016-06-05 DIAGNOSIS — Z79899 Other long term (current) drug therapy: Secondary | ICD-10-CM | POA: Diagnosis not present

## 2016-06-05 DIAGNOSIS — Z955 Presence of coronary angioplasty implant and graft: Secondary | ICD-10-CM | POA: Diagnosis not present

## 2016-06-05 DIAGNOSIS — Z794 Long term (current) use of insulin: Secondary | ICD-10-CM | POA: Diagnosis not present

## 2016-06-05 DIAGNOSIS — Z87891 Personal history of nicotine dependence: Secondary | ICD-10-CM | POA: Insufficient documentation

## 2016-06-05 DIAGNOSIS — I252 Old myocardial infarction: Secondary | ICD-10-CM | POA: Diagnosis not present

## 2016-06-05 DIAGNOSIS — E119 Type 2 diabetes mellitus without complications: Secondary | ICD-10-CM | POA: Diagnosis not present

## 2016-06-05 DIAGNOSIS — C44712 Basal cell carcinoma of skin of right lower limb, including hip: Secondary | ICD-10-CM

## 2016-06-05 DIAGNOSIS — Z7982 Long term (current) use of aspirin: Secondary | ICD-10-CM | POA: Diagnosis not present

## 2016-06-05 DIAGNOSIS — E785 Hyperlipidemia, unspecified: Secondary | ICD-10-CM | POA: Diagnosis not present

## 2016-06-05 DIAGNOSIS — Z85828 Personal history of other malignant neoplasm of skin: Secondary | ICD-10-CM | POA: Insufficient documentation

## 2016-06-05 DIAGNOSIS — K219 Gastro-esophageal reflux disease without esophagitis: Secondary | ICD-10-CM | POA: Insufficient documentation

## 2016-06-05 DIAGNOSIS — C4491 Basal cell carcinoma of skin, unspecified: Secondary | ICD-10-CM | POA: Diagnosis not present

## 2016-06-05 DIAGNOSIS — I10 Essential (primary) hypertension: Secondary | ICD-10-CM | POA: Insufficient documentation

## 2016-06-05 DIAGNOSIS — Z7902 Long term (current) use of antithrombotics/antiplatelets: Secondary | ICD-10-CM | POA: Insufficient documentation

## 2016-06-05 HISTORY — PX: EXCISION MASS LOWER EXTREMETIES: SHX6705

## 2016-06-05 LAB — GLUCOSE, CAPILLARY
GLUCOSE-CAPILLARY: 260 mg/dL — AB (ref 65–99)
GLUCOSE-CAPILLARY: 200 mg/dL — AB (ref 65–99)
Glucose-Capillary: 206 mg/dL — ABNORMAL HIGH (ref 65–99)

## 2016-06-05 SURGERY — EXCISION MASS LOWER EXTREMITIES
Anesthesia: Monitor Anesthesia Care | Laterality: Right | Wound class: Clean

## 2016-06-05 MED ORDER — FAMOTIDINE 20 MG PO TABS
20.0000 mg | ORAL_TABLET | Freq: Once | ORAL | Status: AC
  Administered 2016-06-05: 20 mg via ORAL

## 2016-06-05 MED ORDER — FENTANYL CITRATE (PF) 100 MCG/2ML IJ SOLN
INTRAMUSCULAR | Status: DC | PRN
  Administered 2016-06-05: 50 ug via INTRAVENOUS

## 2016-06-05 MED ORDER — LIDOCAINE HCL (PF) 1 % IJ SOLN
INTRAMUSCULAR | Status: AC
  Filled 2016-06-05: qty 30

## 2016-06-05 MED ORDER — CEFAZOLIN SODIUM-DEXTROSE 2-4 GM/100ML-% IV SOLN
INTRAVENOUS | Status: AC
  Filled 2016-06-05: qty 100

## 2016-06-05 MED ORDER — MIDAZOLAM HCL 2 MG/2ML IJ SOLN
INTRAMUSCULAR | Status: DC | PRN
  Administered 2016-06-05: 2 mg via INTRAVENOUS

## 2016-06-05 MED ORDER — PROPOFOL 10 MG/ML IV BOLUS
INTRAVENOUS | Status: DC | PRN
  Administered 2016-06-05: 60 mg via INTRAVENOUS

## 2016-06-05 MED ORDER — IBUPROFEN 800 MG PO TABS: 800.0000 mg | ORAL_TABLET | Freq: Three times a day (TID) | ORAL | 0 refills | Status: DC | PRN

## 2016-06-05 MED ORDER — FAMOTIDINE 20 MG PO TABS
ORAL_TABLET | ORAL | Status: AC
  Filled 2016-06-05: qty 1

## 2016-06-05 MED ORDER — CEFAZOLIN SODIUM-DEXTROSE 2-4 GM/100ML-% IV SOLN
2.0000 g | INTRAVENOUS | Status: AC
  Administered 2016-06-05: 2 g via INTRAVENOUS

## 2016-06-05 MED ORDER — INSULIN ASPART 100 UNIT/ML ~~LOC~~ SOLN
SUBCUTANEOUS | Status: AC
  Filled 2016-06-05: qty 5

## 2016-06-05 MED ORDER — CHLORHEXIDINE GLUCONATE 4 % EX LIQD: 1.0000 "application " | Freq: Once | CUTANEOUS | Status: DC

## 2016-06-05 MED ORDER — SODIUM CHLORIDE 0.9 % IV SOLN
INTRAVENOUS | Status: DC
  Administered 2016-06-05: 07:00:00 via INTRAVENOUS

## 2016-06-05 MED ORDER — LIDOCAINE HCL 1 % IJ SOLN
INTRAMUSCULAR | Status: DC | PRN
  Administered 2016-06-05: 5 mL

## 2016-06-05 MED ORDER — PROPOFOL 500 MG/50ML IV EMUL
INTRAVENOUS | Status: DC | PRN
  Administered 2016-06-05: 50 ug/kg/min via INTRAVENOUS

## 2016-06-05 MED ORDER — HYDROCODONE-ACETAMINOPHEN 5-325 MG PO TABS: 1.0000 | ORAL_TABLET | Freq: Four times a day (QID) | ORAL | 0 refills | Status: DC | PRN

## 2016-06-05 MED ORDER — BUPIVACAINE HCL 0.5 % IJ SOLN
INTRAMUSCULAR | Status: DC | PRN
  Administered 2016-06-05: 5 mL

## 2016-06-05 MED ORDER — INSULIN ASPART 100 UNIT/ML ~~LOC~~ SOLN
5.0000 [IU] | Freq: Once | SUBCUTANEOUS | Status: AC
  Administered 2016-06-05: 5 [IU] via SUBCUTANEOUS

## 2016-06-05 MED ORDER — BUPIVACAINE HCL (PF) 0.5 % IJ SOLN
INTRAMUSCULAR | Status: AC
  Filled 2016-06-05: qty 30

## 2016-06-05 SURGICAL SUPPLY — 30 items
BLADE SURG 15 STRL LF DISP TIS (BLADE) ×1 IMPLANT
BLADE SURG 15 STRL SS (BLADE) ×2
CHLORAPREP W/TINT 26ML (MISCELLANEOUS) ×3 IMPLANT
CLOSURE WOUND 1/2 X4 (GAUZE/BANDAGES/DRESSINGS)
DRAPE LAPAROTOMY 100X77 ABD (DRAPES) ×3 IMPLANT
DRESSING TELFA 4X3 1S ST N-ADH (GAUZE/BANDAGES/DRESSINGS) ×3 IMPLANT
DRSG TEGADERM 4X4.75 (GAUZE/BANDAGES/DRESSINGS) ×3 IMPLANT
ELECT CAUTERY BLADE 6.4 (BLADE) ×3 IMPLANT
ELECT REM PT RETURN 9FT ADLT (ELECTROSURGICAL) ×3
ELECTRODE REM PT RTRN 9FT ADLT (ELECTROSURGICAL) ×1 IMPLANT
GLOVE PI ORTHOPRO 6.5 (GLOVE) ×6
GLOVE PI ORTHOPRO STRL 6.5 (GLOVE) ×3 IMPLANT
GOWN STRL REUS W/ TWL LRG LVL3 (GOWN DISPOSABLE) ×3 IMPLANT
GOWN STRL REUS W/TWL LRG LVL3 (GOWN DISPOSABLE) ×6
KIT RM TURNOVER STRD PROC AR (KITS) ×3 IMPLANT
LABEL OR SOLS (LABEL) ×3 IMPLANT
NEEDLE HYPO 25X1 1.5 SAFETY (NEEDLE) ×3 IMPLANT
NS IRRIG 500ML POUR BTL (IV SOLUTION) ×3 IMPLANT
PACK BASIN MINOR ARMC (MISCELLANEOUS) ×3 IMPLANT
STRIP CLOSURE SKIN 1/2X4 (GAUZE/BANDAGES/DRESSINGS) IMPLANT
SUT ETHILON 3-0 FS-10 30 BLK (SUTURE) ×6
SUT MNCRL 4-0 (SUTURE) ×2
SUT MNCRL 4-0 27XMFL (SUTURE) ×1
SUT VIC AB 3-0 SH 27 (SUTURE) ×2
SUT VIC AB 3-0 SH 27X BRD (SUTURE) ×1 IMPLANT
SUTURE EHLN 3-0 FS-10 30 BLK (SUTURE) ×2 IMPLANT
SUTURE MNCRL 4-0 27XMF (SUTURE) ×1 IMPLANT
SWABSTK COMLB BENZOIN TINCTURE (MISCELLANEOUS) IMPLANT
SYR BULB EAR ULCER 3OZ GRN STR (SYRINGE) ×3 IMPLANT
SYRINGE 10CC LL (SYRINGE) ×3 IMPLANT

## 2016-06-05 NOTE — Anesthesia Postprocedure Evaluation (Signed)
Anesthesia Post Note  Patient: [name] Suppes  Procedure(s) Performed: Procedure(s) (LRB): EXCISION OF LESION RIGHT LOWER LEG (Right)  Patient location during evaluation: PACU Anesthesia Type: General Level of consciousness: awake and alert Pain management: pain level controlled Vital Signs Assessment: post-procedure vital signs reviewed and stable Respiratory status: spontaneous breathing, nonlabored ventilation, respiratory function stable and patient connected to nasal cannula oxygen Cardiovascular status: blood pressure returned to baseline and stable Postop Assessment: no signs of nausea or vomiting Anesthetic complications: no    Last Vitals:  Vitals:   06/05/16 0824 06/05/16 0839  BP: (!) 104/57 120/65  Pulse: 82 84  Resp: 15 17  Temp: 36.5 C     Last Pain:  Vitals:   06/05/16 0635  TempSrc: Tympanic  PainSc: 0-No pain                 Molli Barrows

## 2016-06-05 NOTE — Discharge Instructions (Signed)

## 2016-06-05 NOTE — Op Note (Signed)
Right flower extremity lesion excision Procedure Note  Pre-operative Diagnosis: Basal cell carcinoma of right lower extremity  Post-operative Diagnosis: same  Indications: Basal cell carcinoma of right lower extremity  Anesthesia: 0.5% marcaine and MAC  Procedure Details  The procedure, risks and complications have been discussed in detail (including, but not limited to airway compromise, infection, bleeding) with the patient, and the patient has signed consent to the procedure. The patient was taken back to the operating room and placed in the bed in the supine position. Moderate anesthesia was administered without any difficulty.  An appropriate time out was performed to identify as right leg lesion excision.   The skin was sterilely prepped and draped over the affected area in the usual fashion.  After adequate local anesthesia was injected, a 15 blade scalpel was used to make an elliptical incision around the lesion.  The specimen was sent to pathology as right leg lesion.  The area was irrated out and hemostasis was achieved with electrocautery.  The skin was underminded in the area and 3-0 Nylon was used interchanging horizontal mattress and simple interrupted sutures.  Sterile dressing was then applied.   Findings: 1cm basal cell carinoma removed in total with at least 66mm margin  EBL: 10 cc's  Drains: none  Condition: Tolerated procedure well   Complications: none.

## 2016-06-05 NOTE — Transfer of Care (Signed)
Immediate Anesthesia Transfer of Care Note  Patient: [name]  Procedure(s) Performed: Procedure(s): EXCISION OF LESION RIGHT LOWER LEG (Right)  Patient Location: PACU  Anesthesia Type:General  Level of Consciousness: awake, alert  and oriented  Airway & Oxygen Therapy: Patient Spontanous Breathing  Post-op Assessment: Report given to RN and Post -op Vital signs reviewed and stable  Post vital signs: Reviewed and stable  Last Vitals:  Vitals:   06/05/16 0635 06/05/16 0824  BP: 137/69 (!) 104/57  Pulse: 86 82  Resp: 20 15  Temp: 36.4 C 36.5 C    Last Pain:  Vitals:   06/05/16 0635  TempSrc: Tympanic  PainSc: 0-No pain         Complications: No apparent anesthesia complications

## 2016-06-05 NOTE — H&P (View-Only) (Signed)
Subjective:     Patient ID: [name], male   DOB: 1946-05-10, 70 y.o.   MRN: [id number]  HPI  70 yr old male who comes in today with Right lower leg basal cell carcinoma that was biopsied by his primary care physician. Patient was seen by Dr. Con Memos at the wound care center because he did get some infection in the area who recommended surgical excision of the lesion. The patient has had basal cell cancers removed mainly from his chest in the past. The first one he had removed was in his 89s. Patient states that when he was younger he did have multiple blistering sunburns and did spend some time in the tanning bed as well. The patient states that now he uses sunscreen and attempts to stay out of the sun. He states that his lesion area is now healing and is keeping a Band-Aid over the area. Patient has not had any bleeding from this area. Patient did have a heart attack about 8 years ago when he had some stents placed at that time and he has been on Plavix since then. Patient is followed by Dr. Rockey Situ with cardiology. I also asked the patient about the lesion on his left lower lip which has been there for about 4 years and started out as seeming like a blood blister in the area however it has started to get a little bit darker and has not gone away over time.  He was set up to have this done but had an Upper respiratory infection and was canceled. Patient states that since that time he has been on antibiotics and started feeling better at about day 3 states that he no longer has any cough or sputum production is fully healed from this.  He denies any fever, chills, malaise, shortness of breath, chest pain, coughing, sputum production, abdominal pain, nausea, vomiting, diarrhea, constipation or dysuria.  Past Medical History  Diagnosis Date  . Coronary artery disease   . Ischemic cardiomyopathy   . Diabetes mellitus   . Hyperlipidemia   . Hypertension   . Myocardial infarction ([place])   . Edema    . Hypoglycemia     history of episodes x2  . Arthritis     right knee  . Cancer ([place])     skin cancer  . Heart attack ([place])     7-8 yrs ago  . GERD (gastroesophageal reflux disease)    Past Surgical History  Procedure Laterality Date  . Cardiac catheterization    . Coronary stent placement      To an occluded LAD  . Skin cancer excision      chest  . Coronary angioplasty     Family History  Problem Relation Age of Onset  . Heart disease Mother   . Heart attack Mother   . Heart disease Father   . Prostate cancer Brother    Social History   Social History  . Marital Status: Single    Spouse Name: N/A  . Number of Children: N/A  . Years of Education: N/A   Occupational History  . Retired    Social History Main Topics  . Smoking status: Former Smoker -- 1.00 packs/day    Types: Cigarettes    Quit date: 06/08/1995  . Smokeless tobacco: Never Used  . Alcohol Use: No  . Drug Use: No  . Sexual Activity: Not Asked   Other Topics Concern  . None   Social History Narrative   Pt  gets regular exercise.    Current outpatient prescriptions:  .  ACCU-CHEK AVIVA PLUS test strip, , Disp: , Rfl: 0 .  aspirin 81 MG tablet, Take 81 mg by mouth daily. , Disp: , Rfl:  .  atorvastatin (LIPITOR) 80 MG tablet, take 1 tablet by mouth at bedtime, Disp: 90 tablet, Rfl: 3 .  B-D INS SYRINGE 0.5CC/31GX5/16 31G X 5/16" 0.5 ML MISC, , Disp: , Rfl: 0 .  carvedilol (COREG) 12.5 MG tablet, take 1 tablet by mouth twice a day with food, Disp: 180 tablet, Rfl: 3 .  clopidogrel (PLAVIX) 75 MG tablet, take 1 tablet by mouth once daily, Disp: 90 tablet, Rfl: 6 .  furosemide (LASIX) 20 MG tablet, take 1 tablet by mouth once daily, Disp: 90 tablet, Rfl: 3 .  glucagon (GLUCAGON EMERGENCY) 1 MG injection, , Disp: , Rfl:  .  insulin glargine (LANTUS) 100 UNIT/ML injection, Inject 20 Units into the skin every morning. , Disp: , Rfl:  .  insulin glargine (LANTUS) 100 UNIT/ML injection, Inject 5 Units  into the skin at bedtime., Disp: , Rfl:  .  losartan (COZAAR) 50 MG tablet, Take 25 mg by mouth daily., Disp: , Rfl:  .  multivitamin (THERAGRAN) per tablet, Take 1 tablet by mouth daily.  , Disp: , Rfl:  .  nitroGLYCERIN (NITROSTAT) 0.4 MG SL tablet, Place 0.4 mg under the tongue every 5 (five) minutes as needed. Do not exceed 3 doses in 15 minutes. , Disp: , Rfl:  .  NOVOLOG 100 UNIT/ML injection, inject 2 to 10 units three times a day before meals as directed, Disp: , Rfl: 0 .  vitamin B-12 (CYANOCOBALAMIN) 1000 MCG tablet, Take 1,000 mcg by mouth daily.  , Disp: , Rfl:  No Known Allergies     Review of Systems  Constitutional: Negative for fever, chills, activity change, fatigue and unexpected weight change.  HENT: Positive for sinus pressure. Negative for congestion and sneezing.   Respiratory: Negative for cough, chest tightness, shortness of breath, wheezing and stridor.   Cardiovascular: Negative for chest pain, palpitations and leg swelling.  Gastrointestinal: Negative for abdominal pain, constipation, blood in stool and abdominal distention.  Genitourinary: Negative for dysuria and hematuria.  Musculoskeletal: Negative for gait problem.  Skin: Positive for wound. Negative for color change, pallor and rash.  Neurological: Negative for dizziness, tremors and weakness.  Hematological: Negative for adenopathy. Does not bruise/bleed easily.  Psychiatric/Behavioral: Negative for agitation. The patient is not nervous/anxious.   All other systems reviewed and are negative.      Filed Vitals:   05/20/16 1329  BP: 136/72  Pulse: 71  Temp: 97.7 F (36.5 C)     Objective:   Physical Exam  Constitutional: He is oriented to person, place, and time. He appears well-developed and well-nourished. No distress.  HENT:  Head: Normocephalic and atraumatic.  Right Ear: External ear normal.  Left Ear: External ear normal.  Nose: Nose normal.  Mouth/Throat: Oropharynx is clear and moist.  No oropharyngeal exudate.  Eyes: Conjunctivae and EOM are normal. Pupils are equal, round, and reactive to light. No scleral icterus.  Neck: Normal range of motion. Neck supple. No tracheal deviation present.  Cardiovascular: Normal rate, regular rhythm, normal heart sounds and intact distal pulses.  Exam reveals no gallop and no friction rub.   No murmur heard. Pulmonary/Chest: Effort normal and breath sounds normal. No respiratory distress. He has no wheezes. He has no rales.  Abdominal: Soft. Bowel sounds are normal. He  exhibits no distension. There is no tenderness. There is no rebound.  Musculoskeletal: Normal range of motion. He exhibits no edema or tenderness.  Neurological: He is alert and oriented to person, place, and time. He has normal reflexes.  Skin: Skin is warm and dry. He is not diaphoretic.  Right lower leg with a 1 cm area of previous biopsy some hyperemic changes around the area but no erythema or signs of infection, of note the hair stops mid calf on his leg and has decreased 1+ pulses in his DP with 1+ pitting edema bilaterally  He does have multiple areas of lesion excision along his chest was have scarred and healed well and one on his neck from a previous sebaceous cyst excision.  Left lower lip with 3 mm area of dark blue to gray lesion, no cervical lymphadenopathy. Small basal cell in his left nasolabial fold.   Some small actinic keratosis along his back however no other lesions that look like basal cell carcinoma or melanoma noted   Psychiatric: He has a normal mood and affect. His behavior is normal. Judgment and thought content normal.  Vitals reviewed.      Assessment:     Right lower leg, basal cell carcinoma    Plan:     I reviewed the patient's past medical history including his well-controlled hypertension and his diabetes. I have reviewed his past laboratory values and noted his past A1c 7.6 range. I have personally reviewed his pathology results  that are above showed the basal cell carcinoma. I discussed with the patient and given this area along the anterior tibial region this notoriously difficult area to heal afterwards and prone to infections and chronic wound healing. I also discussed the patient that given both his diabetes and his vascular disease of this area very well could be difficult to heal. I also discussed with the patient that we would attempt to do a layered wound closure over this area to attempt to keep approximation and healing is best as possible. I also discussed with the patient that given that he is on the aspirin and Plavix we could do this procedure continued on low-dose as long as we did it in the operating room where electrocautery was available. I discussed the risk and benefits with the patient to include bleeding infection nonhealing of the wound need for further procedures to excise tissue as well as anesthetic risk. I recommended the patient have some Mac and local in order to adequately excise and close this area. The patient was given the opportunity to ask questions and have them answered and he is in agreement with right lower leg lesion excision to be done in the next couple of weeks.

## 2016-06-05 NOTE — Interval H&P Note (Signed)
History and Physical Interval Note:  06/05/2016 7:24 AM  [name]  has presented today for surgery, with the diagnosis of BASAL CELL SKIN CANCER  The various methods of treatment have been discussed with the patient and family. After consideration of risks, benefits and other options for treatment, the patient has consented to  Procedure(s): EXCISION OF LESION RIGHT LOWER LEG (Right) as a surgical intervention .  The patient's history has been reviewed, patient examined, no change in status, stable for surgery.  I have reviewed the patient's chart and labs.  Questions were answered to the patient's satisfaction.     Catherine L Loflin

## 2016-06-05 NOTE — Anesthesia Preprocedure Evaluation (Signed)
Anesthesia Evaluation  Patient identified by MRN, date of birth, ID band Patient awake    Reviewed: Allergy & Precautions, H&P , NPO status , Patient's Chart, lab work & pertinent test results, reviewed documented beta blocker date and time   Airway Mallampati: II  TM Distance: >3 FB Neck ROM: full    Dental no notable dental hx. (+) Teeth Intact   Pulmonary neg pulmonary ROS, former smoker,    Pulmonary exam normal breath sounds clear to auscultation       Cardiovascular Exercise Tolerance: Good hypertension, + CAD and + Past MI  negative cardio ROS   Rhythm:regular Rate:Normal     Neuro/Psych negative neurological ROS  negative psych ROS   GI/Hepatic negative GI ROS, Neg liver ROS, GERD  Medicated,  Endo/Other  negative endocrine ROSdiabetes  Renal/GU      Musculoskeletal   Abdominal   Peds  Hematology negative hematology ROS (+)   Anesthesia Other Findings   Reproductive/Obstetrics negative OB ROS                             Anesthesia Physical Anesthesia Plan  ASA: III  Anesthesia Plan: MAC   Post-op Pain Management:    Induction:   Airway Management Planned:   Additional Equipment:   Intra-op Plan:   Post-operative Plan:   Informed Consent: I have reviewed the patients History and Physical, chart, labs and discussed the procedure including the risks, benefits and alternatives for the proposed anesthesia with the patient or authorized representative who has indicated his/her understanding and acceptance.     Plan Discussed with: CRNA  Anesthesia Plan Comments:         Anesthesia Quick Evaluation

## 2016-06-06 ENCOUNTER — Encounter: Payer: Self-pay | Admitting: Surgery

## 2016-06-06 LAB — SURGICAL PATHOLOGY

## 2016-06-25 ENCOUNTER — Encounter: Payer: Self-pay | Admitting: Surgery

## 2016-06-25 ENCOUNTER — Ambulatory Visit (INDEPENDENT_AMBULATORY_CARE_PROVIDER_SITE_OTHER): Payer: Commercial Managed Care - HMO | Admitting: Surgery

## 2016-06-25 VITALS — BP 134/75 | HR 66 | Temp 97.8°F | Ht 69.0 in | Wt 193.0 lb

## 2016-06-25 DIAGNOSIS — C44719 Basal cell carcinoma of skin of left lower limb, including hip: Secondary | ICD-10-CM

## 2016-06-25 NOTE — Progress Notes (Signed)
70 year old male who had a right basal cell carcinoma removed from his anterior tibia on the right leg. Patient has been doing well he states that he had no pain. Patient states that he didn't even have to take Tylenol or ibuprofen. Patient states that a few days ago he did have the bandage pull on one of the stitches and it did open up just slightly in the bottom portion. Patient states that he also had a little bit of yellowish drainage for day after that. Otherwise the patient has had no fevers or chills nausea vomiting or other drainage from the wound.  Vitals:   06/25/16 0942  BP: 134/75  Pulse: 66  Temp: 97.8 F (36.6 C)   PE:  Gen: NAD Right leg: incision c/d/i, some scabbing on inferior portion but no erythema, stitches removed   A/P:  Patient doing well after having a right lower extremity basal cell removed. Discussed the pathology with the patient with clear margins. Patient has been doing well and completely from bandage on if there is any drainage but otherwise doesn't have to have one. Will have him call with any questions or concerns.

## 2016-06-25 NOTE — Patient Instructions (Signed)
Please call our office if you have questions or concerns.   

## 2016-07-10 DIAGNOSIS — E113393 Type 2 diabetes mellitus with moderate nonproliferative diabetic retinopathy without macular edema, bilateral: Secondary | ICD-10-CM | POA: Diagnosis not present

## 2016-07-16 ENCOUNTER — Other Ambulatory Visit: Payer: Self-pay | Admitting: Cardiovascular Disease

## 2016-08-09 DIAGNOSIS — E109 Type 1 diabetes mellitus without complications: Secondary | ICD-10-CM | POA: Diagnosis not present

## 2016-08-16 DIAGNOSIS — E109 Type 1 diabetes mellitus without complications: Secondary | ICD-10-CM | POA: Diagnosis not present

## 2016-10-14 ENCOUNTER — Other Ambulatory Visit: Payer: Self-pay | Admitting: Cardiovascular Disease

## 2016-11-11 DIAGNOSIS — Z8551 Personal history of malignant neoplasm of bladder: Secondary | ICD-10-CM

## 2016-11-11 HISTORY — DX: Personal history of malignant neoplasm of bladder: Z85.51

## 2016-12-27 DIAGNOSIS — M7752 Other enthesopathy of left foot: Secondary | ICD-10-CM | POA: Diagnosis not present

## 2017-01-13 DIAGNOSIS — E109 Type 1 diabetes mellitus without complications: Secondary | ICD-10-CM | POA: Diagnosis not present

## 2017-01-20 DIAGNOSIS — E782 Mixed hyperlipidemia: Secondary | ICD-10-CM | POA: Diagnosis not present

## 2017-01-20 DIAGNOSIS — E1059 Type 1 diabetes mellitus with other circulatory complications: Secondary | ICD-10-CM | POA: Diagnosis not present

## 2017-01-20 DIAGNOSIS — E1065 Type 1 diabetes mellitus with hyperglycemia: Secondary | ICD-10-CM | POA: Diagnosis not present

## 2017-01-20 DIAGNOSIS — Z9641 Presence of insulin pump (external) (internal): Secondary | ICD-10-CM | POA: Diagnosis not present

## 2017-02-05 ENCOUNTER — Other Ambulatory Visit: Payer: Self-pay | Admitting: Cardiovascular Disease

## 2017-02-26 ENCOUNTER — Other Ambulatory Visit: Payer: Self-pay | Admitting: Cardiovascular Disease

## 2017-04-17 DIAGNOSIS — E1065 Type 1 diabetes mellitus with hyperglycemia: Secondary | ICD-10-CM | POA: Diagnosis not present

## 2017-04-23 ENCOUNTER — Other Ambulatory Visit: Payer: Self-pay | Admitting: Cardiovascular Disease

## 2017-04-24 NOTE — Progress Notes (Signed)
Patient ID: CHEZ BULNES, male   DOB: 05/06/1946, 71 y.o.   MRN: [id number] Cardiology Office Note  Date:  04/25/2017   ID:  ZEBBIE ACE, DOB May 19, 1946, MRN [id number]  PCP:  [name], MD   Chief Complaint  Patient presents with  . other    12 month follow up. Meds reviewed by the pt. verbally. "doing well."     HPI:  Mr. Rathgeber is a very pleasant 71 year old gentleman with a history of coronary artery disease,  occluded LAD with stent placed February 2008 ( Cypher 3.0 x 8 mm DES stent),  poorly controlled diabetes, hypertension  who presents for routine followup of his coronary artery disease  In follow-up today he reports that he feels well with no complaints Active [place] a horse [place] Denies having any significant bruising or bleeding on Plavix with aspirin  Lab work reviewed with him total cholesterol 140, LDL 75, hemoglobin A1c 7.9 Followed by endocrinology  EKG on today's visit showing normal sinus rhythm rate 69 bpm old anterior MI, T-wave abnormality No change from previous EKGs  Other past medical history He is active on a horse farm.  His previous angina was described as posterior neck pain.  On previous visits, he reported having night sweats. We held his carvedilol and Lipitor. Night sweats did not improve. told he had infection in his gums and has had significant work done. night sweats have improved    Cardiac catheter report from 2008 details 25% ostial left main, 100% mid LAD with stent placed at this location,40% proximal circumflex, 20% PL vessel disease Previous stress test showing scar in the mid to distal anterior wall. Previous lab work; Hemoglobin A1c 8.2, total cholesterol 134, LDL 65  PMH:   has a past medical history of Arthritis; Cancer ([place]); Coronary artery disease; Diabetes mellitus; Edema; GERD (gastroesophageal reflux disease); Heart attack ([place]) (2007); Hyperlipidemia; Hypertension; Hypoglycemia; Ischemic  cardiomyopathy; and Myocardial infarction ([place]).  PSH:    Past Surgical History:  Procedure Laterality Date  . CARDIAC CATHETERIZATION    . CORONARY ANGIOPLASTY    . CORONARY STENT PLACEMENT     To an occluded LAD x2  . EXCISION MASS LOWER EXTREMETIES Right 06/05/2016   Procedure: EXCISION OF LESION RIGHT LOWER LEG;  Surgeon: Hubbard Robinson, MD;  Location: [facility] ORS;  Service: General;  Laterality: Right;  . SKIN CANCER EXCISION     chest    Current Outpatient Prescriptions  Medication Sig Dispense Refill  . aspirin 81 MG tablet Take 81 mg by mouth daily.     Marland Kitchen atorvastatin (LIPITOR) 80 MG tablet take 1 tablet by mouth at bedtime 90 tablet 3  . carvedilol (COREG) 12.5 MG tablet take 1 tablet by mouth twice a day with food 180 tablet 3  . clopidogrel (PLAVIX) 75 MG tablet take 1 tablet by mouth once daily 90 tablet 0  . furosemide (LASIX) 20 MG tablet take 1 tablet by mouth once daily 90 tablet 3  . glucagon (GLUCAGON EMERGENCY) 1 MG injection Inject 1 mg into the skin once as needed.     Marland Kitchen ibuprofen (ADVIL,MOTRIN) 800 MG tablet Take 1 tablet (800 mg total) by mouth every 8 (eight) hours as needed. 30 tablet 0  . insulin glargine (LANTUS) 100 UNIT/ML injection Inject 20 Units into the skin every morning.     . insulin glargine (LANTUS) 100 UNIT/ML injection Inject 7 Units into the skin at bedtime.     Marland Kitchen losartan (COZAAR) 50 MG  tablet Take 25 mg by mouth every morning.     . multivitamin (THERAGRAN) per tablet Take 1 tablet by mouth daily.      . nitroGLYCERIN (NITROSTAT) 0.4 MG SL tablet Place 0.4 mg under the tongue every 5 (five) minutes as needed. Do not exceed 3 doses in 15 minutes.     Marland Kitchen NOVOLOG 100 UNIT/ML injection inject 2 to 10 units three times a day before meals as directed  0  . vitamin B-12 (CYANOCOBALAMIN) 1000 MCG tablet Take 1,000 mcg by mouth daily.       No current facility-administered medications for this visit.      Allergies:   Patient has no known  allergies.   Social History:  The patient  reports that he quit smoking about 21 years ago. His smoking use included Cigarettes. He has a 10.00 pack-year smoking history. He has never used smokeless tobacco. He reports that he drinks alcohol. He reports that he does not use drugs.   Family History:   family history includes Heart attack in his mother; Heart disease in his father and mother; Prostate cancer in his brother.    Review of Systems: Review of Systems  Constitutional: Negative.   Respiratory: Negative.   Cardiovascular: Negative.   Gastrointestinal: Negative.   Musculoskeletal: Negative.   Neurological: Negative.   Psychiatric/Behavioral: Negative.   All other systems reviewed and are negative.    PHYSICAL EXAM: VS:  BP 120/60 (BP Location: Left Arm, Patient Position: Sitting, Cuff Size: Normal)   Pulse 74   Ht 5\' 9"  (1.753 m)   Wt 188 lb 8 oz (85.5 kg)   BMI 27.84 kg/m  , BMI Body mass index is 27.84 kg/m. GEN: Well nourished, well developed, in no acute distress  HEENT: normal  Neck: no JVD, carotid bruits, or masses Cardiac: RRR; no murmurs, rubs, or gallops,no edema  Respiratory:  clear to auscultation bilaterally, normal work of breathing GI: soft, nontender, nondistended, + BS MS: no deformity or atrophy  Skin: warm and dry, no rash Neuro:  Strength and sensation are intact Psych: euthymic mood, full affect    Recent Labs: No results found for requested labs within last 8760 hours.    Lipid Panel Lab Results  Component Value Date   CHOL 146 01/03/2011   HDL 53 01/03/2011   LDLCALC 79 01/03/2011   TRIG 70 01/03/2011      Wt Readings from Last 3 Encounters:  04/25/17 188 lb 8 oz (85.5 kg)  06/25/16 193 lb (87.5 kg)  06/05/16 194 lb (88 kg)       ASSESSMENT AND PLAN:  Atherosclerosis of native coronary artery of native heart without angina pectoris - Plan: EKG 12-Lead Currently with no symptoms of angina. No further workup at this time.  Continue current medication regimen.  HYPERTENSION, BENIGN - Plan: EKG 12-Lead Blood pressure is well controlled on today's visit. No changes made to the medications. He takes losartan 25 mg daily Occasionally holds the medications for low blood pressure  Hyperlipidemia LDL slightly above goal, we did offer Zetia He is comfortable where his numbers are  Controlled type 2 diabetes mellitus with other circulatory complication, without long-term current use of insulin ([name])  hemoglobin A1c 7.9 He has had diabetes for 50 years Followed by endocrinology   Total encounter time more than 15 minutes  Greater than 50% was spent in counseling and coordination of care with the patient   Disposition:   F/U  12 months   Orders  Placed This Encounter  Procedures  . EKG 12-Lead    Total encounter time more than 15 minutes  Greater than 50% was spent in counseling and coordination of care with the patient   Signed, Esmond Plants, M.D., Ph.D. 04/25/2017  [place], [place]

## 2017-04-25 ENCOUNTER — Ambulatory Visit (INDEPENDENT_AMBULATORY_CARE_PROVIDER_SITE_OTHER): Payer: Medicare HMO | Admitting: Cardiovascular Disease

## 2017-04-25 ENCOUNTER — Encounter: Payer: Self-pay | Admitting: Cardiovascular Disease

## 2017-04-25 VITALS — BP 120/60 | HR 74 | Ht 69.0 in | Wt 188.5 lb

## 2017-04-25 DIAGNOSIS — E782 Mixed hyperlipidemia: Secondary | ICD-10-CM | POA: Diagnosis not present

## 2017-04-25 DIAGNOSIS — I10 Essential (primary) hypertension: Secondary | ICD-10-CM | POA: Diagnosis not present

## 2017-04-25 DIAGNOSIS — E1159 Type 2 diabetes mellitus with other circulatory complications: Secondary | ICD-10-CM | POA: Diagnosis not present

## 2017-04-25 DIAGNOSIS — Z9641 Presence of insulin pump (external) (internal): Secondary | ICD-10-CM | POA: Diagnosis not present

## 2017-04-25 DIAGNOSIS — R600 Localized edema: Secondary | ICD-10-CM | POA: Diagnosis not present

## 2017-04-25 DIAGNOSIS — I25118 Atherosclerotic heart disease of native coronary artery with other forms of angina pectoris: Secondary | ICD-10-CM | POA: Diagnosis not present

## 2017-04-25 DIAGNOSIS — E1059 Type 1 diabetes mellitus with other circulatory complications: Secondary | ICD-10-CM | POA: Diagnosis not present

## 2017-04-25 DIAGNOSIS — E1065 Type 1 diabetes mellitus with hyperglycemia: Secondary | ICD-10-CM | POA: Diagnosis not present

## 2017-04-25 NOTE — Patient Instructions (Signed)

## 2017-05-22 DIAGNOSIS — Z125 Encounter for screening for malignant neoplasm of prostate: Secondary | ICD-10-CM | POA: Diagnosis not present

## 2017-05-22 DIAGNOSIS — I10 Essential (primary) hypertension: Secondary | ICD-10-CM | POA: Diagnosis not present

## 2017-05-22 DIAGNOSIS — R319 Hematuria, unspecified: Secondary | ICD-10-CM | POA: Diagnosis not present

## 2017-05-27 ENCOUNTER — Other Ambulatory Visit: Payer: Self-pay

## 2017-05-29 DIAGNOSIS — R319 Hematuria, unspecified: Secondary | ICD-10-CM | POA: Diagnosis not present

## 2017-05-29 DIAGNOSIS — E109 Type 1 diabetes mellitus without complications: Secondary | ICD-10-CM | POA: Diagnosis not present

## 2017-05-29 DIAGNOSIS — Z125 Encounter for screening for malignant neoplasm of prostate: Secondary | ICD-10-CM | POA: Diagnosis not present

## 2017-05-29 DIAGNOSIS — Z0000 Encounter for general adult medical examination without abnormal findings: Secondary | ICD-10-CM | POA: Diagnosis not present

## 2017-05-29 DIAGNOSIS — E782 Mixed hyperlipidemia: Secondary | ICD-10-CM | POA: Diagnosis not present

## 2017-05-29 DIAGNOSIS — I10 Essential (primary) hypertension: Secondary | ICD-10-CM | POA: Diagnosis not present

## 2017-06-03 ENCOUNTER — Other Ambulatory Visit: Payer: Self-pay | Admitting: Urology

## 2017-06-03 DIAGNOSIS — N401 Enlarged prostate with lower urinary tract symptoms: Secondary | ICD-10-CM | POA: Diagnosis not present

## 2017-06-03 DIAGNOSIS — R3914 Feeling of incomplete bladder emptying: Secondary | ICD-10-CM | POA: Diagnosis not present

## 2017-06-03 DIAGNOSIS — R310 Gross hematuria: Secondary | ICD-10-CM | POA: Diagnosis not present

## 2017-06-03 DIAGNOSIS — N3289 Other specified disorders of bladder: Secondary | ICD-10-CM | POA: Diagnosis not present

## 2017-06-17 ENCOUNTER — Ambulatory Visit
Admission: RE | Admit: 2017-06-17 | Discharge: 2017-06-17 | Disposition: A | Discharge status: Home / Self Care | Payer: Medicare HMO | Source: Ambulatory Visit | Attending: Urology | Admitting: Urology

## 2017-06-25 DIAGNOSIS — L97521 Non-pressure chronic ulcer of other part of left foot limited to breakdown of skin: Secondary | ICD-10-CM | POA: Diagnosis not present

## 2017-06-27 ENCOUNTER — Ambulatory Visit
Admission: RE | Admit: 2017-06-27 | Discharge: 2017-06-27 | Disposition: A | Discharge status: Home / Self Care | Payer: Medicare HMO | Source: Ambulatory Visit | Attending: Urology | Admitting: Urology

## 2017-06-27 DIAGNOSIS — R310 Gross hematuria: Secondary | ICD-10-CM | POA: Insufficient documentation

## 2017-06-27 DIAGNOSIS — N329 Bladder disorder, unspecified: Secondary | ICD-10-CM | POA: Diagnosis not present

## 2017-06-27 DIAGNOSIS — R3129 Other microscopic hematuria: Secondary | ICD-10-CM | POA: Diagnosis not present

## 2017-06-27 DIAGNOSIS — R918 Other nonspecific abnormal finding of lung field: Secondary | ICD-10-CM | POA: Diagnosis not present

## 2017-06-27 LAB — POCT I-STAT CREATININE: CREATININE: 0.9 mg/dL (ref 0.61–1.24)

## 2017-06-27 MED ORDER — IOPAMIDOL (ISOVUE-300) INJECTION 61%
125.0000 mL | Freq: Once | INTRAVENOUS | Status: AC | PRN
  Administered 2017-06-27: 125 mL via INTRAVENOUS

## 2017-07-01 DIAGNOSIS — R310 Gross hematuria: Secondary | ICD-10-CM | POA: Diagnosis not present

## 2017-07-01 DIAGNOSIS — C673 Malignant neoplasm of anterior wall of bladder: Secondary | ICD-10-CM | POA: Diagnosis not present

## 2017-07-03 NOTE — H&P (Signed)
NAME:  [name], [name]               ACCOUNT NO.:  MEDICAL RECORD NO.:  [id number]  LOCATION:                                 FACILITY:  PHYSICIAN:  Maryan Puls          DATE OF BIRTH:  Aug 17, 1946  DATE OF ADMISSION: DATE OF DISCHARGE:                            HISTORY AND PHYSICAL   Same-day surgery; July 15, 2017.  CHIEF COMPLAINT:  Bladder cancer.  HISTORY:  [name] is a 71 year old white male who presented to the office in July with a 4-week history of intermittent gross painless hematuria.  Evaluation in the office with CT and cystoscopy revealed that he had four papillary bladder tumors along the anterior bladder wall towards the bladder neck.  CT scan indicated normal upper tracts without kidney stones or renal masses.  The patient comes in now for transurethral resection of bladder tumors.  PAST MEDICAL HISTORY:  No drug allergies.  CURRENT MEDICATIONS: 1. Plavix. 2. Losartan. 3. Lasix. 4. Carvedilol. 5. Multivitamin. 6. Aspirin.  SURGICAL HISTORY:  Coronary artery stent placement x1 in 2006.  SOCIAL HISTORY:  The patient quit smoking in 1998 with a 15 pack year history.  He denied alcohol use.  FAMILY HISTORY:  The patient's father died of myocardial infarction at age 52.  Mother died of heart disease at age 28.  CURRENT MEDICAL CONDITION: 1. Coronary artery disease, status post myocardial infarction in 2006. 2. Diabetes since 1967. 3. Hypertension.  REVIEW OF SYSTEMS:  The patient reports bruising easily.  He denied chest pain, shortness of breath, diabetes, or stroke.  PHYSICAL EXAMINATION:  GENERAL:  Well-nourished white male, in no acute distress. HEENT:  Sclerae were clear. NECK:  Supple.  No palpable cervical adenopathy. LUNGS:  Clear to auscultation. CARDIOVASCULAR:  Regular rhythm and rate without audible murmurs. ABDOMEN:  Soft.  Nontender abdomen. GU:  Circumcised testes, smooth, nontender, 16 mL size each. RECTAL:  40 g  smooth, nontender prostate. NEUROMUSCULAR:  Grossly intact.  IMPRESSION: 1. Bladder cancer. 2. Gross hematuria.  PLAN:  Transurethral resection of bladder tumors.          ______________________________ Maryan Puls     MW/MEDQ  D:  07/02/2017  T:  07/02/2017  Job:  [id number]

## 2017-07-08 ENCOUNTER — Encounter
Admission: RE | Admit: 2017-07-08 | Discharge: 2017-07-08 | Disposition: A | Discharge status: Home / Self Care | Payer: Medicare HMO | Source: Ambulatory Visit | Attending: Urology | Admitting: Urology

## 2017-07-08 DIAGNOSIS — Z01812 Encounter for preprocedural laboratory examination: Secondary | ICD-10-CM | POA: Insufficient documentation

## 2017-07-08 DIAGNOSIS — C673 Malignant neoplasm of anterior wall of bladder: Secondary | ICD-10-CM | POA: Insufficient documentation

## 2017-07-08 NOTE — Pre-Procedure Instructions (Signed)
Progress Notes Encounter Date: 04/25/2017 [name], MD  Cardiology  Expand All Collapse All   [] Hide copied text Patient ID: [name], male   DOB: 25-Jan-1946, 71 y.o.   MRN: [id number] Cardiology Office Note  Date:  04/25/2017   ID:  [name], DOB 16-Mar-1946, MRN [id number]  PCP:  [name], MD           Chief Complaint  Patient presents with  . other    12 month follow up. Meds reviewed by the pt. verbally. "doing well."     HPI:  [name] is a very pleasant 71 year old gentleman with a history of coronary artery disease,  occluded LAD with stent placed February 2008 ( Cypher 3.0 x 8 mm DES stent),  poorly controlled diabetes, hypertension  who presents for routine followup of his coronary artery disease  In follow-up today he reports that he feels well with no complaints Active on a horse farm Denies having any significant bruising or bleeding on Plavix with aspirin  Lab work reviewed with him total cholesterol 140, LDL 75, hemoglobin A1c 7.9 Followed by endocrinology  EKG on today's visit showing normal sinus rhythm rate 69 bpm old anterior MI, T-wave abnormality No change from previous EKGs  Other past medical history He is active on a horse farm.  His previous angina was described as posterior neck pain.  On previous visits, he reported having night sweats. We held his carvedilol and Lipitor. Night sweats did not improve. told he had infection in his gums and has had significant work done. night sweats have improved    Cardiac catheter report from 2008 details 25% ostial left main, 100% mid LAD with stent placed at this location,40% proximal circumflex, 20% PL vessel disease Previous stress test showing scar in the mid to distal anterior wall. Previous lab work; Hemoglobin A1c 8.2, total cholesterol 134, LDL 65  PMH:   has a past medical history of Arthritis; Cancer ([place]); Coronary artery disease;  Diabetes mellitus; Edema; GERD (gastroesophageal reflux disease); Heart attack ([place]) (2007); Hyperlipidemia; Hypertension; Hypoglycemia; Ischemic cardiomyopathy; and Myocardial infarction ([place]).  PSH:         Past Surgical History:  Procedure Laterality Date  . CARDIAC CATHETERIZATION    . CORONARY ANGIOPLASTY    . CORONARY STENT PLACEMENT     To an occluded LAD x2  . EXCISION MASS LOWER EXTREMETIES Right 06/05/2016   Procedure: EXCISION OF LESION RIGHT LOWER LEG;  Surgeon: Hubbard Robinson, MD;  Location: [facility] ORS;  Service: General;  Laterality: Right;  . SKIN CANCER EXCISION     chest          Current Outpatient Prescriptions  Medication Sig Dispense Refill  . aspirin 81 MG tablet Take 81 mg by mouth daily.     Marland Kitchen atorvastatin (LIPITOR) 80 MG tablet take 1 tablet by mouth at bedtime 90 tablet 3  . carvedilol (COREG) 12.5 MG tablet take 1 tablet by mouth twice a day with food 180 tablet 3  . clopidogrel (PLAVIX) 75 MG tablet take 1 tablet by mouth once daily 90 tablet 0  . furosemide (LASIX) 20 MG tablet take 1 tablet by mouth once daily 90 tablet 3  . glucagon (GLUCAGON EMERGENCY) 1 MG injection Inject 1 mg into the skin once as needed.     Marland Kitchen ibuprofen (ADVIL,MOTRIN) 800 MG tablet Take 1 tablet (800 mg total) by mouth every 8 (eight) hours as needed. 30 tablet 0  . insulin glargine (  LANTUS) 100 UNIT/ML injection Inject 20 Units into the skin every morning.     . insulin glargine (LANTUS) 100 UNIT/ML injection Inject 7 Units into the skin at bedtime.     Marland Kitchen losartan (COZAAR) 50 MG tablet Take 25 mg by mouth every morning.     . multivitamin (THERAGRAN) per tablet Take 1 tablet by mouth daily.      . nitroGLYCERIN (NITROSTAT) 0.4 MG SL tablet Place 0.4 mg under the tongue every 5 (five) minutes as needed. Do not exceed 3 doses in 15 minutes.     Marland Kitchen NOVOLOG 100 UNIT/ML injection inject 2 to 10 units three times a day before meals as directed  0  . vitamin  B-12 (CYANOCOBALAMIN) 1000 MCG tablet Take 1,000 mcg by mouth daily.       No current facility-administered medications for this visit.      Allergies:   Patient has no known allergies.   Social History:  The patient  reports that he quit smoking about 21 years ago. His smoking use included Cigarettes. He has a 10.00 pack-year smoking history. He has never used smokeless tobacco. He reports that he drinks alcohol. He reports that he does not use drugs.   Family History:   family history includes Heart attack in his mother; Heart disease in his father and mother; Prostate cancer in his brother.    Review of Systems: Review of Systems  Constitutional: Negative.   Respiratory: Negative.   Cardiovascular: Negative.   Gastrointestinal: Negative.   Musculoskeletal: Negative.   Neurological: Negative.   Psychiatric/Behavioral: Negative.   All other systems reviewed and are negative.    PHYSICAL EXAM: VS:  BP 120/60 (BP Location: Left Arm, Patient Position: Sitting, Cuff Size: Normal)   Pulse 74   Ht 5\' 9"  (1.753 m)   Wt 188 lb 8 oz (85.5 kg)   BMI 27.84 kg/m  , BMI Body mass index is 27.84 kg/m. GEN: Well nourished, well developed, in no acute distress  HEENT: normal  Neck: no JVD, carotid bruits, or masses Cardiac: RRR; no murmurs, rubs, or gallops,no edema  Respiratory:  clear to auscultation bilaterally, normal work of breathing GI: soft, nontender, nondistended, + BS MS: no deformity or atrophy  Skin: warm and dry, no rash Neuro:  Strength and sensation are intact Psych: euthymic mood, full affect    Recent Labs: No results found for requested labs within last 8760 hours.    Lipid Panel RecentLabs       Lab Results  Component Value Date   CHOL 146 01/03/2011   HDL 53 01/03/2011   LDLCALC 79 01/03/2011   TRIG 70 01/03/2011           Wt Readings from Last 3 Encounters:  04/25/17 188 lb 8 oz (85.5 kg)  06/25/16 193 lb (87.5 kg)    06/05/16 194 lb (88 kg)       ASSESSMENT AND PLAN:  Atherosclerosis of native coronary artery of native heart without angina pectoris - Plan: EKG 12-Lead Currently with no symptoms of angina. No further workup at this time. Continue current medication regimen.  HYPERTENSION, BENIGN - Plan: EKG 12-Lead Blood pressure is well controlled on today's visit. No changes made to the medications. He takes losartan 25 mg daily Occasionally holds the medications for low blood pressure  Hyperlipidemia LDL slightly above goal, we did offer Zetia He is comfortable where his numbers are  Controlled type 2 diabetes mellitus with other circulatory complication, without long-term current use  of insulin ([name])  hemoglobin A1c 7.9 He has had diabetes for 50 years Followed by endocrinology   Total encounter time more than 15 minutes  Greater than 50% was spent in counseling and coordination of care with the patient   Disposition:   F/U  12 months      Orders Placed This Encounter  Procedures  . EKG 12-Lead    Total encounter time more than 15 minutes  Greater than 50% was spent in counseling and coordination of care with the patient   Signed, Esmond Plants, M.D., Ph.D. 04/25/2017  [place]       Electronically signed by [name], MD at 04/25/2017 12:50 PM      Office Visit on 04/25/2017        Detailed Report

## 2017-07-08 NOTE — Patient Instructions (Addendum)
  Your procedure is scheduled on: 07-15-17 TUESDAY Report to Same Day Surgery 2nd floor medical mall [facility] Entrance-take elevator on left to 2nd floor.  Check in with surgery information desk.) To find out your arrival time please call [phone] between 1PM - 3PM on 07-11-17 FRIDAY  Remember: Instructions that are not followed completely may result in serious medical risk, up to and including death, or upon the discretion of your surgeon and anesthesiologist your surgery may need to be rescheduled.    _x___ 1. Do not eat food after midnight. No gum chewing or hard candies-MAY HAVE WATER UP TO 2 HOURS PRIOR TO ARRIVAL TIME TO   HOSPITAL    __x__ 2. No Alcohol for 24 hours before or after surgery.   __x__3. No Smoking for 24 prior to surgery.   ____  4. Bring all medications with you on the day of surgery if instructed.    __x__ 5. Notify your doctor if there is any change in your medical condition     (cold, fever, infections).     Do not wear jewelry, make-up, hairpins, clips or nail polish.  Do not wear lotions, powders, or perfumes. You may wear deodorant.  Do not shave 48 hours prior to surgery. Men may shave face and neck.  Do not bring valuables to the hospital.    [facility] is not responsible for any belongings or valuables.               Contacts, dentures or bridgework may not be worn into surgery.  Leave your suitcase in the car. After surgery it may be brought to your room.  For patients admitted to the hospital, discharge time is determined by your treatment team.   Patients discharged the day of surgery will not be allowed to drive home.  You will need someone to drive you home and stay with you the night of your procedure.    Please read over the following fact sheets that you were given:      _x___ [place] WITH A SMALL SIP OF WATER. These include:  1. COREG (CARVEDILOL)  2.  3.  4.  5.  6.  ____Fleets enema  or Magnesium Citrate as directed.   ____ Use CHG Soap or sage wipes as directed on instruction sheet   ____ Use inhalers on the day of surgery and bring to hospital day of surgery  ____ Stop Metformin and Janumet 2 days prior to surgery.    _X___ Take 1/2 of usual insulin dose the night before surgery and none on the morning surgery-TAKE HALF OF YOUR LANTUS Monday NIGHT AND NO LANTUS/NOVOLOG ON Tuesday MORNING   _x___ Follow recommendations from Cardiologist, Pulmonologist or PCP regarding stopping Aspirin, Coumadin, Pllavix ,Eliquis, Effient, or Pradaxa, and Pletal-PT STOPPED ASPIRIN AND PLAVIX ON 07-04-17  X____Stop Anti-inflammatories such as Advil, ALEVE, Ibuprofen, Motrin, Naproxen, Naprosyn, Goodies powders or aspirin products NOW- OK to take Tylenol    ____ Stop supplements until after surgery.     ____ Bring C-Pap to the hospital.

## 2017-07-10 ENCOUNTER — Encounter
Admission: RE | Admit: 2017-07-10 | Discharge: 2017-07-10 | Disposition: A | Discharge status: Home / Self Care | Payer: Medicare HMO | Source: Ambulatory Visit | Attending: Urology | Admitting: Urology

## 2017-07-10 DIAGNOSIS — C673 Malignant neoplasm of anterior wall of bladder: Secondary | ICD-10-CM | POA: Diagnosis not present

## 2017-07-10 DIAGNOSIS — Z01812 Encounter for preprocedural laboratory examination: Secondary | ICD-10-CM | POA: Diagnosis not present

## 2017-07-10 HISTORY — DX: Anemia, unspecified: D64.9

## 2017-07-10 LAB — BASIC METABOLIC PANEL
Anion gap: 5 (ref 5–15)
BUN: 23 mg/dL — AB (ref 6–20)
CALCIUM: 8.7 mg/dL — AB (ref 8.9–10.3)
CHLORIDE: 105 mmol/L (ref 101–111)
CO2: 30 mmol/L (ref 22–32)
Creatinine, Ser: 0.7 mg/dL (ref 0.61–1.24)
Glucose, Bld: 73 mg/dL (ref 65–99)
Potassium: 4.5 mmol/L (ref 3.5–5.1)
SODIUM: 140 mmol/L (ref 135–145)

## 2017-07-10 LAB — CBC
HCT: 34.3 % — ABNORMAL LOW (ref 40.0–52.0)
Hemoglobin: 11.7 g/dL — ABNORMAL LOW (ref 13.0–18.0)
MCH: 31.4 pg (ref 26.0–34.0)
MCHC: 34.1 g/dL (ref 32.0–36.0)
MCV: 92 fL (ref 80.0–100.0)
PLATELETS: 222 10*3/uL (ref 150–440)
RBC: 3.73 MIL/uL — AB (ref 4.40–5.90)
RDW: 13.5 % (ref 11.5–14.5)
WBC: 7 10*3/uL (ref 3.8–10.6)

## 2017-07-15 ENCOUNTER — Other Ambulatory Visit: Payer: Self-pay | Admitting: Cardiovascular Disease

## 2017-07-15 ENCOUNTER — Encounter
Admission: RE | Disposition: A | Discharge status: Home / Self Care | Payer: Self-pay | Source: Ambulatory Visit | Attending: Urology

## 2017-07-15 ENCOUNTER — Ambulatory Visit: Payer: Medicare HMO | Admitting: Anesthesiology

## 2017-07-15 ENCOUNTER — Encounter: Payer: Self-pay | Admitting: Anesthesiology

## 2017-07-15 ENCOUNTER — Ambulatory Visit
Admission: RE | Admit: 2017-07-15 | Discharge: 2017-07-15 | Disposition: A | Discharge status: Home / Self Care | Payer: Medicare HMO | Source: Ambulatory Visit | Attending: Urology | Admitting: Urology

## 2017-07-15 DIAGNOSIS — I10 Essential (primary) hypertension: Secondary | ICD-10-CM | POA: Diagnosis not present

## 2017-07-15 DIAGNOSIS — E119 Type 2 diabetes mellitus without complications: Secondary | ICD-10-CM | POA: Diagnosis not present

## 2017-07-15 DIAGNOSIS — C673 Malignant neoplasm of anterior wall of bladder: Secondary | ICD-10-CM

## 2017-07-15 DIAGNOSIS — I252 Old myocardial infarction: Secondary | ICD-10-CM | POA: Diagnosis not present

## 2017-07-15 DIAGNOSIS — C675 Malignant neoplasm of bladder neck: Secondary | ICD-10-CM | POA: Diagnosis not present

## 2017-07-15 DIAGNOSIS — Z955 Presence of coronary angioplasty implant and graft: Secondary | ICD-10-CM | POA: Diagnosis not present

## 2017-07-15 DIAGNOSIS — R310 Gross hematuria: Secondary | ICD-10-CM | POA: Insufficient documentation

## 2017-07-15 DIAGNOSIS — Z87891 Personal history of nicotine dependence: Secondary | ICD-10-CM | POA: Insufficient documentation

## 2017-07-15 DIAGNOSIS — Z7982 Long term (current) use of aspirin: Secondary | ICD-10-CM | POA: Diagnosis not present

## 2017-07-15 DIAGNOSIS — D494 Neoplasm of unspecified behavior of bladder: Secondary | ICD-10-CM | POA: Diagnosis not present

## 2017-07-15 DIAGNOSIS — R319 Hematuria, unspecified: Secondary | ICD-10-CM | POA: Diagnosis not present

## 2017-07-15 DIAGNOSIS — I2510 Atherosclerotic heart disease of native coronary artery without angina pectoris: Secondary | ICD-10-CM | POA: Diagnosis not present

## 2017-07-15 DIAGNOSIS — Z79899 Other long term (current) drug therapy: Secondary | ICD-10-CM | POA: Insufficient documentation

## 2017-07-15 HISTORY — PX: TRANSURETHRAL RESECTION OF BLADDER TUMOR: SHX2575

## 2017-07-15 LAB — GLUCOSE, CAPILLARY
Glucose-Capillary: 250 mg/dL — ABNORMAL HIGH (ref 65–99)
Glucose-Capillary: 232 mg/dL — ABNORMAL HIGH (ref 65–99)

## 2017-07-15 SURGERY — TURBT (TRANSURETHRAL RESECTION OF BLADDER TUMOR)
Anesthesia: General | Wound class: Clean Contaminated

## 2017-07-15 MED ORDER — PHENYLEPHRINE HCL 10 MG/ML IJ SOLN
INTRAMUSCULAR | Status: DC | PRN
  Administered 2017-07-15 (×4): 100 ug via INTRAVENOUS

## 2017-07-15 MED ORDER — FENTANYL CITRATE (PF) 100 MCG/2ML IJ SOLN
INTRAMUSCULAR | Status: DC | PRN
  Administered 2017-07-15: 100 ug via INTRAVENOUS
  Administered 2017-07-15: 25 ug via INTRAVENOUS

## 2017-07-15 MED ORDER — FENTANYL CITRATE (PF) 250 MCG/5ML IJ SOLN
INTRAMUSCULAR | Status: AC
  Filled 2017-07-15: qty 5

## 2017-07-15 MED ORDER — NUCYNTA 50 MG PO TABS: 50.0000 mg | ORAL_TABLET | Freq: Four times a day (QID) | ORAL | 0 refills | Status: DC | PRN

## 2017-07-15 MED ORDER — PROPOFOL 10 MG/ML IV BOLUS
INTRAVENOUS | Status: AC
  Filled 2017-07-15: qty 20

## 2017-07-15 MED ORDER — FAMOTIDINE 20 MG PO TABS
ORAL_TABLET | ORAL | Status: AC
  Administered 2017-07-15: 20 mg via ORAL
  Filled 2017-07-15: qty 1

## 2017-07-15 MED ORDER — LEVOFLOXACIN IN D5W 500 MG/100ML IV SOLN
INTRAVENOUS | Status: AC
  Filled 2017-07-15: qty 100

## 2017-07-15 MED ORDER — LEVOFLOXACIN IN D5W 500 MG/100ML IV SOLN
500.0000 mg | Freq: Once | INTRAVENOUS | Status: AC
  Administered 2017-07-15: 500 mg via INTRAVENOUS

## 2017-07-15 MED ORDER — HYOSCYAMINE SULFATE SL 0.125 MG SL SUBL: 0.1250 mg | SUBLINGUAL_TABLET | SUBLINGUAL | 1 refills | Status: DC | PRN

## 2017-07-15 MED ORDER — FAMOTIDINE 20 MG PO TABS
20.0000 mg | ORAL_TABLET | Freq: Once | ORAL | Status: AC
  Administered 2017-07-15: 20 mg via ORAL

## 2017-07-15 MED ORDER — LEVOFLOXACIN 500 MG PO TABS: 500.0000 mg | ORAL_TABLET | Freq: Every day | ORAL | 1 refills | Status: DC

## 2017-07-15 MED ORDER — MITOMYCIN CHEMO FOR BLADDER INSTILLATION 40 MG
40.0000 mg | Freq: Once | INTRAVENOUS | Status: AC
  Administered 2017-07-15: 40 mg via INTRAVESICAL
  Filled 2017-07-15: qty 40

## 2017-07-15 MED ORDER — TAPENTADOL HCL 50 MG PO TABS
ORAL_TABLET | ORAL | Status: AC
  Administered 2017-07-15: 50 mg via ORAL
  Filled 2017-07-15: qty 1

## 2017-07-15 MED ORDER — LIDOCAINE HCL 2 % EX GEL
CUTANEOUS | Status: AC
  Filled 2017-07-15: qty 10

## 2017-07-15 MED ORDER — PROPOFOL 10 MG/ML IV BOLUS
INTRAVENOUS | Status: DC | PRN
  Administered 2017-07-15: 170 mg via INTRAVENOUS

## 2017-07-15 MED ORDER — ONDANSETRON 8 MG PO TBDP: 4.0000 mg | ORAL_TABLET | Freq: Four times a day (QID) | ORAL | 3 refills | Status: DC | PRN

## 2017-07-15 MED ORDER — DEXAMETHASONE SODIUM PHOSPHATE 10 MG/ML IJ SOLN
INTRAMUSCULAR | Status: AC
  Filled 2017-07-15: qty 1

## 2017-07-15 MED ORDER — ONDANSETRON HCL 4 MG/2ML IJ SOLN: 4.0000 mg | Freq: Once | INTRAMUSCULAR | Status: DC | PRN

## 2017-07-15 MED ORDER — HYOSCYAMINE SULFATE 0.125 MG SL SUBL
0.1250 mg | SUBLINGUAL_TABLET | SUBLINGUAL | Status: DC | PRN
  Administered 2017-07-15: 0.125 mg via SUBLINGUAL
  Filled 2017-07-15 (×2): qty 1

## 2017-07-15 MED ORDER — SODIUM CHLORIDE 0.9 % IV SOLN
INTRAVENOUS | Status: DC
  Administered 2017-07-15: 12:00:00 via INTRAVENOUS

## 2017-07-15 MED ORDER — LIDOCAINE HCL 2 % EX GEL
CUTANEOUS | Status: DC | PRN
  Administered 2017-07-15: 1

## 2017-07-15 MED ORDER — FENTANYL CITRATE (PF) 100 MCG/2ML IJ SOLN: 25.0000 ug | INTRAMUSCULAR | Status: DC | PRN

## 2017-07-15 MED ORDER — DOCUSATE SODIUM 100 MG PO CAPS: 200.0000 mg | ORAL_CAPSULE | Freq: Two times a day (BID) | ORAL | 3 refills | Status: DC

## 2017-07-15 MED ORDER — DEXAMETHASONE SODIUM PHOSPHATE 4 MG/ML IJ SOLN
INTRAMUSCULAR | Status: DC | PRN
  Administered 2017-07-15: 5 mg via INTRAVENOUS

## 2017-07-15 MED ORDER — LIDOCAINE HCL (CARDIAC) 20 MG/ML IV SOLN
INTRAVENOUS | Status: DC | PRN
  Administered 2017-07-15: 100 mg via INTRAVENOUS

## 2017-07-15 MED ORDER — GLYCOPYRROLATE 0.2 MG/ML IJ SOLN
INTRAMUSCULAR | Status: DC | PRN
  Administered 2017-07-15: 0.2 mg via INTRAVENOUS

## 2017-07-15 MED ORDER — ONDANSETRON HCL 4 MG/2ML IJ SOLN
INTRAMUSCULAR | Status: AC
  Filled 2017-07-15: qty 2

## 2017-07-15 MED ORDER — ONDANSETRON HCL 4 MG/2ML IJ SOLN
INTRAMUSCULAR | Status: DC | PRN
  Administered 2017-07-15: 4 mg via INTRAVENOUS

## 2017-07-15 MED ORDER — TAPENTADOL HCL 50 MG PO TABS
50.0000 mg | ORAL_TABLET | Freq: Once | ORAL | Status: AC
  Administered 2017-07-15: 50 mg via ORAL

## 2017-07-15 MED ORDER — LIDOCAINE HCL (PF) 1 % IJ SOLN
INTRAMUSCULAR | Status: AC
  Filled 2017-07-15: qty 2

## 2017-07-15 MED ORDER — LIDOCAINE HCL (PF) 2 % IJ SOLN
INTRAMUSCULAR | Status: AC
  Filled 2017-07-15: qty 2

## 2017-07-15 SURGICAL SUPPLY — 30 items
BAG DRAIN CYSTO-URO LG1000N (MISCELLANEOUS) ×3 IMPLANT
BAG URO DRAIN 2000ML W/SPOUT (MISCELLANEOUS) ×3 IMPLANT
BRUSH SCRUB EZ 1% IODOPHOR (MISCELLANEOUS) ×3 IMPLANT
CATH FOLEY 2WAY  5CC 20FR SIL (CATHETERS) ×2
CATH FOLEY 2WAY 5CC 20FR SIL (CATHETERS) ×1 IMPLANT
DRAINAE PROTECTOR AND CATHETER PLUG IMPLANT
DRSG TELFA 4X3 1S NADH ST (GAUZE/BANDAGES/DRESSINGS) ×3 IMPLANT
ELECT COAG MONO 22-24F ROLLER (MISCELLANEOUS) ×3
ELECT LOOP 22F BIPOLAR SML (ELECTROSURGICAL)
ELECT LOOP MED HF 24F 12D (CUTTING LOOP) ×3 IMPLANT
ELECT RESECT POWERBALL 24F (MISCELLANEOUS) IMPLANT
ELECTRODE COAG MONO 22-24F RLR (MISCELLANEOUS) ×1 IMPLANT
ELECTRODE LOOP 22F BIPOLAR SML (ELECTROSURGICAL) IMPLANT
EVACUATOR ELLICK (MISCELLANEOUS) ×3 IMPLANT
GLOVE BIO SURGEON STRL SZ7.5 (GLOVE) ×12 IMPLANT
GLYCINE 1.5% IRRIG UROMATIC (IV SOLUTION) ×6 IMPLANT
GOWN STRL REUS W/ TWL LRG LVL4 (GOWN DISPOSABLE) ×1 IMPLANT
GOWN STRL REUS W/ TWL XL LVL3 (GOWN DISPOSABLE) ×1 IMPLANT
GOWN STRL REUS W/TWL LRG LVL4 (GOWN DISPOSABLE) ×2
GOWN STRL REUS W/TWL XL LVL3 (GOWN DISPOSABLE) ×2
KIT RM TURNOVER CYSTO AR (KITS) ×3 IMPLANT
LOOP CUT BIPOLAR 24F LRG (ELECTROSURGICAL) ×3 IMPLANT
PACK CYSTO AR (MISCELLANEOUS) ×3 IMPLANT
PLUG CATH AND CAP STER (CATHETERS) ×3 IMPLANT
POWERBALL ELECTRODE 24 F ×3 IMPLANT
SET IRRIG Y TYPE TUR BLADDER L (SET/KITS/TRAYS/PACK) ×6 IMPLANT
SOL .9 NS 3000ML IRR  AL (IV SOLUTION) ×12
SOL .9 NS 3000ML IRR UROMATIC (IV SOLUTION) ×6 IMPLANT
SYRINGE IRR TOOMEY STRL 70CC (SYRINGE) ×3 IMPLANT
WATER STERILE IRR 1000ML POUR (IV SOLUTION) ×3 IMPLANT

## 2017-07-15 NOTE — Anesthesia Postprocedure Evaluation (Signed)
Anesthesia Post Note  Patient: RICH PAPROCKI  Procedure(s) Performed: Procedure(s) (LRB): TRANSURETHRAL RESECTION OF BLADDER TUMOR (TURBT) (N/A)  Patient location during evaluation: PACU Anesthesia Type: General Level of consciousness: awake and alert Pain management: pain level controlled Vital Signs Assessment: post-procedure vital signs reviewed and stable Respiratory status: spontaneous breathing, nonlabored ventilation, respiratory function stable and patient connected to nasal cannula oxygen Cardiovascular status: blood pressure returned to baseline and stable Postop Assessment: no signs of nausea or vomiting Anesthetic complications: no     Last Vitals:  Vitals:   07/15/17 1549 07/15/17 1602  BP: (!) 102/47 106/60  Pulse: 71   Resp: 18   Temp:    SpO2: 99%     Last Pain:  Vitals:   07/15/17 1602  TempSrc:   PainSc: 0-No pain                 Precious Haws Piscitello

## 2017-07-15 NOTE — H&P (Signed)
Date of Initial H&P: 07/02/17  History reviewed, patient examined, no change in status, stable for surgery.

## 2017-07-15 NOTE — Anesthesia Preprocedure Evaluation (Signed)
Anesthesia Evaluation  Patient identified by MRN, date of birth, ID band Patient awake    Reviewed: Allergy & Precautions, H&P , NPO status , Patient's Chart, lab work & pertinent test results, reviewed documented beta blocker date and time   History of Anesthesia Complications Negative for: history of anesthetic complications  Airway Mallampati: III  TM Distance: >3 FB Neck ROM: full    Dental  (+) Implants, Dental Advidsory Given, Teeth Intact   Pulmonary neg pulmonary ROS, former smoker,           Cardiovascular Exercise Tolerance: Good hypertension, (-) angina+ CAD, + Past MI and + Cardiac Stents  (-) CABG (-) dysrhythmias (-) Valvular Problems/Murmurs     Neuro/Psych negative neurological ROS  negative psych ROS   GI/Hepatic Neg liver ROS, GERD  Medicated and Controlled,  Endo/Other  diabetes  Renal/GU negative Renal ROS  negative genitourinary   Musculoskeletal   Abdominal   Peds  Hematology negative hematology ROS (+)   Anesthesia Other Findings Past Medical History: No date: Anemia No date: Arthritis     Comment:  right knee No date: Cancer ([place])     Comment:  BLADDER CANCER (2018)skin cancer No date: Coronary artery disease No date: Diabetes mellitus No date: Edema No date: GERD (gastroesophageal reflux disease)     Comment:  rare-NO MEDS 2007: Heart attack ([place]) No date: Hyperlipidemia No date: Hypertension No date: Hypoglycemia     Comment:  history of episodes x2 No date: Ischemic cardiomyopathy No date: Myocardial infarction ([place])   Reproductive/Obstetrics negative OB ROS                             Anesthesia Physical Anesthesia Plan  ASA: III  Anesthesia Plan: General   Post-op Pain Management:    Induction: Intravenous  PONV Risk Score and Plan: 2 and Ondansetron and Dexamethasone  Airway Management Planned: LMA  Additional Equipment:   Intra-op  Plan:   Post-operative Plan: Extubation in OR  Informed Consent: I have reviewed the patients History and Physical, chart, labs and discussed the procedure including the risks, benefits and alternatives for the proposed anesthesia with the patient or authorized representative who has indicated his/her understanding and acceptance.   Dental Advisory Given  Plan Discussed with: Anesthesiologist, CRNA and Surgeon  Anesthesia Plan Comments:         Anesthesia Quick Evaluation

## 2017-07-15 NOTE — Transfer of Care (Signed)
Immediate Anesthesia Transfer of Care Note  Patient: [name]  Procedure(s) Performed: Procedure(s): TRANSURETHRAL RESECTION OF BLADDER TUMOR (TURBT) (N/A)  Patient Location: PACU  Anesthesia Type:General  Level of Consciousness: sedated  Airway & Oxygen Therapy: Patient Spontanous Breathing and Patient connected to face mask oxygen  Post-op Assessment: Report given to RN and Post -op Vital signs reviewed and stable  Post vital signs: Reviewed and stable  Last Vitals:  Vitals:   07/15/17 1142 07/15/17 1517  BP: 126/68 (!) 101/5  Pulse: 72 68  Resp: 16 10  Temp: (!) 35.9 C (!) 36.2 C  SpO2: 100% 100%    Last Pain:  Vitals:   07/15/17 1142  TempSrc: Oral         Complications: No apparent anesthesia complications

## 2017-07-15 NOTE — Anesthesia Procedure Notes (Signed)
Procedure Name: LMA Insertion Date/Time: 07/15/2017 1:46 PM Performed by: Rosaria Ferries, Bethel Sirois Pre-anesthesia Checklist: Patient identified, Emergency Drugs available, Suction available and Patient being monitored Patient Re-evaluated:Patient Re-evaluated prior to induction Oxygen Delivery Method: Circle system utilized Preoxygenation: Pre-oxygenation with 100% oxygen Induction Type: IV induction LMA: LMA inserted LMA Size: 5.0 Number of attempts: 1 Placement Confirmation: positive ETCO2 and breath sounds checked- equal and bilateral Tube secured with: Tape Dental Injury: Teeth and Oropharynx as per pre-operative assessment

## 2017-07-15 NOTE — Anesthesia Post-op Follow-up Note (Signed)
Anesthesia QCDR form completed.        

## 2017-07-15 NOTE — Discharge Instructions (Signed)
AMBULATORY SURGERY  DISCHARGE INSTRUCTIONS   1) The drugs that you were given will stay in your system until tomorrow so for the next 24 hours you should not:  A) Drive an automobile B) Make any legal decisions C) Drink any alcoholic beverage   2) You may resume regular meals tomorrow.  Today it is better to start with liquids and gradually work up to solid foods.  You may eat anything you prefer, but it is better to start with liquids, then soup and crackers, and gradually work up to solid foods.   3) Please notify your doctor immediately if you have any unusual bleeding, trouble breathing, redness and pain at the surgery site, drainage, fever, or pain not relieved by medication. 4)   5) Your post-operative visit with Dr.                                     is: Date:                        Time:    Please call to schedule your post-operative visit.  6) Additional Instructions:      Bladder Cancer Bladder cancer is an abnormal growth of tissue in the bladder. The bladder is the balloon-like sac in the pelvis. It collects and stores urine that comes from the kidneys through the ureters. The bladder wall is made of layers. If cancer spreads into these layers and through the wall of the bladder, it becomes more difficult to treat. What are the causes? The cause of this condition is not known. What increases the risk? The following factors may make you more likely to develop this condition:  Smoking.  Workplace risks (occupational exposures), such as rubber, leather, textile, dyes, chemicals, and paint.  Being white.  Your age. Most people with bladder cancer are over the age of 80.  Being male.  Having chronic bladder inflammation.  Having a personal history of bladder cancer.  Having a family history of bladder cancer (heredity).  Having had chemotherapy or radiation therapy to the pelvis.  Having been exposed to arsenic.  What are the signs or  symptoms? Initial symptoms of this condition include:  Blood in the urine.  Painful urination.  Frequent bladder or urine infections.  Increase in urgency and frequency of urination.  Advanced symptoms of this condition include:  Not being able to urinate.  Low back pain on one side.  Loss of appetite.  Weight loss.  Fatigue.  Swelling in the feet.  Bone pain.  How is this diagnosed? This condition is diagnosed based on your medical history, a physical exam, urine tests, lab tests, imaging tests, and your symptoms. You may also have other tests or procedures done, such as:  A narrow tube being inserted into your bladder through your urethra (cystoscopy) in order to view the lining of your bladder for tumors.  A biopsy to sample the tumor to see if cancer is present.  If cancer is present, it will then be staged to determine its severity and extent. Staging is an assessment of:  The size of the tumor.  Whether the cancer has spread.  Where the cancer has spread.  It is important to know how deeply into the bladder wall cancer has grown and whether cancer has spread to any other parts of your body. Staging may require blood tests or imaging  tests, such as a CT scan, MRI, bone scan, or chest X-ray. How is this treated? Based on the stage of cancer, one treatment or a combination of treatments may be recommended. The most common forms of treatment are:  Surgery to remove the cancer. Procedures that may be done include transurethral resection and cystectomy.  Radiation therapy. This is high-energy X-rays or other particles. This is often used in combination with chemotherapy.  Chemotherapy. During this treatment, medicines are used to kill cancer cells.  Immunotherapy. This uses medicines to help your own immune system destroy cancer cells.  Follow these instructions at home:  Take over-the-counter and prescription medicines only as told by your health care  provider.  Maintain a healthy diet. Some of your treatments might affect your appetite.  Consider joining a support group. This may help you learn to cope with the stress of having bladder cancer.  Tell your cancer care team if you develop side effects. They may be able to recommend ways to relieve them.  Keep all follow-up visits as told by your health care provider. This is important. Where to find more information:  American Cancer Society: www.cancer.[place] www.cancer.gov Contact a health care provider if:  You have symptoms of a urinary tract infection. These include: ? Fever. ? Chills. ? Weakness. ? Muscle aches. ? Abdominal pain. ? Frequent and intense urge to urinate. ? Burning feeling in the bladder or urethra during urination. Get help right away if:  There is blood in your urine.  You cannot urinate.  You have severe pain or other symptoms that do not go away. Summary  Bladder cancer is an abnormal growth of tissue in the bladder.  This condition is diagnosed based on your medical history, a physical exam, urine tests, lab tests, imaging tests, and your symptoms.  Based on the stage of cancer, surgery, chemotherapy, or a combination of treatments may be recommended.  Consider joining a support group. This may help you learn to cope with the stress of having bladder cancer. This information is not intended to replace advice given to you by your health care provider. Make sure you discuss any questions you have with your health care provider. Document Released: 10/31/2003 Document Revised: 10/01/2016 Document Reviewed: 10/01/2016 Elsevier Interactive Patient Education  2017 Elsevier Inc.   Transurethral Resection of Bladder Tumor Transurethral resection of a bladder tumor is the removal (resection) of a cancerous growth (tumor) on the inside wall of the bladder. The bladder is the organ that holds urine. The tumor is removed  through the tube that carries urine out of the body (urethra). In a transurethral resection, a thin telescope with a light, a tiny camera, and an electric cutting edge (resectoscope) is passed through the urethra. In men, the opening of the urethra is at the end of the penis. In women, it is just above the vaginal opening. Tell a health care provider about:  Any allergies you have.  All medicines you are taking, including vitamins, herbs, eye drops, creams, and over-the-counter medicines.  Any problems you or family members have had with anesthetic medicines.  Any blood disorders you have.  Any surgeries you have had.  Any medical conditions you have.  Any recent urinary tract infections you have had.  Whether you are pregnant or may be pregnant. What are the risks? Generally, this is a safe procedure. However, problems may occur, including:  Infection.  Bleeding.  Allergic reactions to medicines.  Damage to other  structures or organs, such as: ? The urethra. ? The tubes that drain urine from the kidneys into the bladder (ureters).  Pain and burning during urination.  Difficulty urinating due to partial blockage of the urethra.  Inability to urinate (urinary retention).  What happens before the procedure?  Follow instructions from your health care provider about eating and drinking restrictions.  Ask your health care provider about: ? Changing or stopping your regular medicines. This is especially important if you are taking diabetes medicines or blood thinners. ? Taking medicines such as aspirin and ibuprofen. These medicines can thin your blood. Do not take these medicines before your procedure if your health care provider instructs you not to.  You may have a physical exam.  You may have tests, including: ? Blood tests. ? Urine tests. ? Electrocardiogram (ECG). This test measures the electrical activity of the heart.  You may be given antibiotic medicine to  help prevent infection.  Ask your health care provider how your surgical site will be marked or identified.  Plan to have someone take you home after the procedure. What happens during the procedure?  To reduce your risk of infection: ? Your health care team will wash or sanitize their hands. ? Your skin will be washed with soap.  An IV tube will be inserted into one of your veins.  You will be given one or more of the following: ? A medicine to help you relax (sedative). ? A medicine to make you fall asleep (general anesthetic). ? A medicine that is injected into your spine to numb the area below and slightly above the injection site (spinal anesthetic).  Your legs will be placed in foot rests (stirrups) so that your legs are apart and your knees are bent.  The resectoscope will be passed through your urethra and into your bladder.  The part of your bladder that is affected by the tumor will be resected using the cutting edge of the resectoscope.  The resectoscope will be removed.  A thin, flexible tube (catheter) will be passed through your urethra and into your bladder. The catheter will drain urine into a bag outside of your body. ? Fluid may be passed through the catheter to keep the catheter open. The procedure may vary among health care providers and hospitals. What happens after the procedure?  Your blood pressure, heart rate, breathing rate, and blood oxygen level will be monitored often until the medicines you were given have worn off.  You may continue to receive fluids and medicines through an IV tube.  You will have some pain. Pain medicine will be available to help you.  You will have a catheter draining your urine. ? You will have blood in your urine. Your catheter may be kept in until your urine is clear. ? Your urinary drainage will be monitored. If necessary, your bladder may be rinsed out (irrigated) by passing fluid through your catheter.  You will be  encouraged to walk around as soon as possible.  You may have to wear compression stockings. These stockings help to prevent blood clots and reduce swelling in your legs.  Do not drive for 24 hours if you received a sedative. This information is not intended to replace advice given to you by your health care provider. Make sure you discuss any questions you have with your health care provider. Document Released: 08/24/2009 Document Revised: 06/30/2016 Document Reviewed: 07/20/2015 Elsevier Interactive Patient Education  2018 [employer].   Transurethral Resection of  Bladder Tumor, Care After Refer to this sheet in the next few weeks. These instructions provide you with information about caring for yourself after your procedure. Your health care provider may also give you more specific instructions. Your treatment has been planned according to current medical practices, but problems sometimes occur. Call your health care provider if you have any problems or questions after your procedure. What can I expect after the procedure? After the procedure, it is common to have:  A small amount of blood in your urine for up to 2 weeks.  Soreness or mild discomfort from your catheter. After your catheter is removed, you may have mild soreness, especially when urinating.  Pain in your lower abdomen.  Follow these instructions at home:  Medicines  Take over-the-counter and prescription medicines only as told by your health care provider.  Do not drive or operate heavy machinery while taking prescription pain medicine.  Do not drive for 24 hours if you received a sedative.  If you were prescribed antibiotic medicine, take it as told by your health care provider. Do not stop taking the antibiotic even if you start to feel better. Activity  Return to your normal activities as told by your health care provider. Ask your health care provider what activities are safe for you.  Do not lift anything  that is heavier than 10 lb (4.5 kg) for as long as told by your health care provider.  Avoid intense physical activity for as long as told by your health care provider.  Walk at least one time every day. This helps to prevent blood clots. You may increase your physical activity gradually as you start to feel better. General instructions  Do not drink alcohol for as long as told by your health care provider. This is especially important if you are taking prescription pain medicines.  Do not take baths, swim, or use a hot tub until your health care provider approves.  If you have a catheter, follow instructions from your health care provider about caring for your catheter and your drainage bag.  Drink enough fluid to keep your urine clear or pale yellow.  Wear compression stockings as told by your health care provider. These stockings help to prevent blood clots and reduce swelling in your legs.  Keep all follow-up visits as told by your health care provider. This is important. Contact a health care provider if:  You have pain that gets worse or does not improve with medicine.  You have blood in your urine for more than 2 weeks.  You have cloudy or bad-smelling urine.  You become constipated. Signs of constipation may include having: ? Fewer than three bowel movements in a week. ? Difficulty having a bowel movement. ? Stools that are dry, hard, or larger than normal.  You have a fever. Get help right away if:  You have: ? Severe pain. ? Bright-red blood in your urine. ? Blood clots in your urine. ? A lot of blood in your urine.  Your catheter has been removed and you are not able to urinate.  You have a catheter in place and the catheter is not draining urine. This information is not intended to replace advice given to you by your health care provider. Make sure you discuss any questions you have with your health care provider. Document Released: 10/09/2015 Document Revised:  06/30/2016 Document Reviewed: 07/20/2015 Elsevier Interactive Patient Education  2018 [place].  Indwelling Urinary Catheter Care, Adult Take good care of  your catheter to keep it working and to prevent problems. How to wear your catheter Attach your catheter to your leg with tape (adhesive tape) or a leg strap. Make sure it is not too tight. If you use tape, remove any bits of tape that are already on the catheter. How to wear a drainage bag You should have:  A large overnight bag.  A small leg bag.  Overnight Bag You may wear the overnight bag at any time. Always keep the bag below the level of your bladder but off the floor. When you sleep, put a clean plastic bag in a wastebasket. Then hang the bag inside the wastebasket. Leg Bag Never wear the leg bag at night. Always wear the leg bag below your knee. Keep the leg bag secure with a leg strap or tape. How to care for your skin  Clean the skin around the catheter at least once every day.  Shower every day. Do not take baths.  Put creams, lotions, or ointments on your genital area only as told by your doctor.  Do not use powders, sprays, or lotions on your genital area. How to clean your catheter and your skin 1. Wash your hands with soap and water. 2. Wet a washcloth in warm water and gentle (mild) soap. 3. Use the washcloth to clean the skin where the catheter enters your body. Clean downward and wipe away from the catheter in small circles. Do not wipe toward the catheter. 4. Pat the area dry with a clean towel. Make sure to clean off all soap. How to care for your drainage bags Empty your drainage bag when it is ?- full or at least 2-3 times a day. Replace your drainage bag once a month or sooner if it starts to smell bad or look dirty. Do not clean your drainage bag unless told by your doctor. Emptying a drainage bag  Supplies Needed  Rubbing alcohol.  Gauze pad or cotton ball.  Tape or a leg  strap.  Steps 1. Wash your hands with soap and water. 2. Separate (detach) the bag from your leg. 3. Hold the bag over the toilet or a clean container. Keep the bag below your hips and bladder. This stops pee (urine) from going back into the tube. 4. Open the pour spout at the bottom of the bag. 5. Empty the pee into the toilet or container. Do not let the pour spout touch any surface. 6. Put rubbing alcohol on a gauze pad or cotton ball. 7. Use the gauze pad or cotton ball to clean the pour spout. 8. Close the pour spout. 9. Attach the bag to your leg with tape or a leg strap. 10. Wash your hands.  Changing a drainage bag Supplies Needed  Alcohol wipes.  A clean drainage bag.  Adhesive tape or a leg strap.  Steps 1. Wash your hands with soap and water. 2. Separate the dirty bag from your leg. 3. Pinch the rubber catheter with your fingers so that pee does not spill out. 4. Separate the catheter tube from the drainage tube where these tubes connect (at the connection valve). Do not let the tubes touch any surface. 5. Clean the end of the catheter tube with an alcohol wipe. Use a different alcohol wipe to clean the end of the drainage tube. 6. Connect the catheter tube to the drainage tube of the clean bag. 7. Attach the new bag to the leg with adhesive tape or a leg strap.  8. Wash your hands.  How to prevent infection and other problems  Never pull on your catheter or try to remove it. Pulling can damage tissue in your body.  Always wash your hands before and after touching your catheter.  If a leg strap gets wet, replace it with a dry one.  Drink enough fluids to keep your pee clear or pale yellow, or as told by your doctor.  Do not let the drainage bag or tubing touch the floor.  Wear cotton underwear.  If you are male, wipe from front to back after you poop (have a bowel movement).  Check on the catheter often to make sure it works and the tubing is not  twisted. Get help if:  Your pee is cloudy.  Your pee smells unusually bad.  Your pee is not draining into the bag.  Your tube gets clogged.  Your catheter starts to leak.  Your bladder feels full. Get help right away if:  You have redness, swelling, or pain where the catheter enters your body.  You have fluid, pus, or a bad smell coming from the area where the catheter enters your body.  The area where the catheter enters your body feels warm.  You have a fever.  You have pain in your: ? Stomach (abdomen). ? Legs. ? Lower back. ? Bladder.  You see blood fill the catheter.  Your pee is pink or red.  You feel sick to your stomach (nauseous).  You throw up (vomit).  You have chills.  Your catheter gets pulled out. This information is not intended to replace advice given to you by your health care provider. Make sure you discuss any questions you have with your health care provider. Document Released: 02/22/2013 Document Revised: 09/25/2016 Document Reviewed: 04/12/2014 Elsevier Interactive Patient Education  [employer].

## 2017-07-15 NOTE — Op Note (Signed)
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 Transurethral resection of bladder tumors                      2. Instillation of mitomycin-C into the bladder                      3. Foley catheter placement    Surgeon: Otelia Limes. Yves Dill MD  Anesthesia: General  Indications:See the history and physical. After informed consent the above procedure(s) were requested     Technique and findings: After adequate general anesthesia been obtained patient was placed into dorsal lithotomy position and the perineum was prepped and draped in the usual fashion. The 24 French resectoscope sheath was advanced into the bladder with the obturator in place. The resection scope was coupled to the camera and then placed into the sheath. Bladder was thoroughly inspected. The patient had lateral lobe prostatic hypertrophy. There were 3 tumors along the anterior bladder neck region of the bladder. The tumor along the left side was somewhat polypoid. The other 2 tumors were papillary in appearance . All tumors were sequentially resected and submitted separately for pathology examination. The base of the tumors were fulgurated with the rollerball electrode. The bladder was then drained and the resectoscope was removed. 10 cc of viscous Xylocaine was instilled within the urethra. A 20 French silicone catheter was placed and irrigated until clear. 40 mg of mitomycin-C was instilled within the bladder and the catheter plugged. The procedure was then terminated and patient transferred to the recovery room in stable condition.

## 2017-07-16 ENCOUNTER — Encounter: Payer: Self-pay | Admitting: Urology

## 2017-07-17 ENCOUNTER — Other Ambulatory Visit: Payer: Self-pay | Admitting: Pathology

## 2017-07-17 LAB — SURGICAL PATHOLOGY

## 2017-07-28 DIAGNOSIS — E1065 Type 1 diabetes mellitus with hyperglycemia: Secondary | ICD-10-CM | POA: Diagnosis not present

## 2017-07-30 DIAGNOSIS — C673 Malignant neoplasm of anterior wall of bladder: Secondary | ICD-10-CM | POA: Diagnosis not present

## 2017-08-04 DIAGNOSIS — R809 Proteinuria, unspecified: Secondary | ICD-10-CM | POA: Diagnosis not present

## 2017-08-04 DIAGNOSIS — Z9641 Presence of insulin pump (external) (internal): Secondary | ICD-10-CM | POA: Diagnosis not present

## 2017-08-04 DIAGNOSIS — E1059 Type 1 diabetes mellitus with other circulatory complications: Secondary | ICD-10-CM | POA: Diagnosis not present

## 2017-08-04 DIAGNOSIS — E1029 Type 1 diabetes mellitus with other diabetic kidney complication: Secondary | ICD-10-CM | POA: Diagnosis not present

## 2017-08-12 DIAGNOSIS — C673 Malignant neoplasm of anterior wall of bladder: Secondary | ICD-10-CM | POA: Diagnosis not present

## 2017-08-19 DIAGNOSIS — C673 Malignant neoplasm of anterior wall of bladder: Secondary | ICD-10-CM | POA: Diagnosis not present

## 2017-08-26 ENCOUNTER — Other Ambulatory Visit: Payer: Self-pay | Admitting: Family Medicine

## 2017-08-26 DIAGNOSIS — R911 Solitary pulmonary nodule: Secondary | ICD-10-CM

## 2017-08-26 DIAGNOSIS — C673 Malignant neoplasm of anterior wall of bladder: Secondary | ICD-10-CM | POA: Diagnosis not present

## 2017-09-02 DIAGNOSIS — C673 Malignant neoplasm of anterior wall of bladder: Secondary | ICD-10-CM | POA: Diagnosis not present

## 2017-09-02 DIAGNOSIS — Z79899 Other long term (current) drug therapy: Secondary | ICD-10-CM | POA: Diagnosis not present

## 2017-09-03 ENCOUNTER — Ambulatory Visit: Payer: Medicare HMO

## 2017-09-04 DIAGNOSIS — C673 Malignant neoplasm of anterior wall of bladder: Secondary | ICD-10-CM | POA: Diagnosis not present

## 2017-09-04 DIAGNOSIS — Z79899 Other long term (current) drug therapy: Secondary | ICD-10-CM | POA: Diagnosis not present

## 2017-09-09 DIAGNOSIS — Z1211 Encounter for screening for malignant neoplasm of colon: Secondary | ICD-10-CM | POA: Diagnosis not present

## 2017-09-09 DIAGNOSIS — C679 Malignant neoplasm of bladder, unspecified: Secondary | ICD-10-CM | POA: Diagnosis not present

## 2017-09-09 DIAGNOSIS — C673 Malignant neoplasm of anterior wall of bladder: Secondary | ICD-10-CM | POA: Diagnosis not present

## 2017-09-12 ENCOUNTER — Telehealth: Payer: Self-pay | Admitting: Cardiovascular Disease

## 2017-09-12 NOTE — Telephone Encounter (Signed)
Clearance routed to number listed. 

## 2017-09-12 NOTE — Telephone Encounter (Signed)
Received request from [facility] GI clinic for cardiac clearance for patients upcoming colonoscopy scheduled at [facility] on 12/03/2017 with Dr. Gustavo Lah and patient reports that he is taking Plavix 75 mg once daily. Spoke with patient and he denies any new symptoms and states that he is doing well. Plavix was started due to occluded LAD with stent placed February 2008 ( Cypher 3.0 x 8 mm DES stent). Will route clearance to Dr. Rockey Situ for review and then route clearance over to Fax # [phone]

## 2017-09-12 NOTE — Telephone Encounter (Signed)
Acceptable risk to stop his Plavix 5 days before procedure There has been numerous years since stent placement Could likely stay on aspirin alone without Plavix even at this time

## 2017-09-16 DIAGNOSIS — C673 Malignant neoplasm of anterior wall of bladder: Secondary | ICD-10-CM | POA: Diagnosis not present

## 2017-09-16 DIAGNOSIS — Z79899 Other long term (current) drug therapy: Secondary | ICD-10-CM | POA: Diagnosis not present

## 2017-09-17 ENCOUNTER — Ambulatory Visit
Admission: RE | Admit: 2017-09-17 | Discharge: 2017-09-17 | Disposition: A | Discharge status: Home / Self Care | Payer: Medicare HMO | Source: Ambulatory Visit | Attending: Family Medicine | Admitting: Family Medicine

## 2017-09-17 DIAGNOSIS — Z8551 Personal history of malignant neoplasm of bladder: Secondary | ICD-10-CM | POA: Insufficient documentation

## 2017-09-17 DIAGNOSIS — R911 Solitary pulmonary nodule: Secondary | ICD-10-CM | POA: Diagnosis not present

## 2017-09-17 DIAGNOSIS — R918 Other nonspecific abnormal finding of lung field: Secondary | ICD-10-CM | POA: Diagnosis not present

## 2017-09-17 DIAGNOSIS — I2510 Atherosclerotic heart disease of native coronary artery without angina pectoris: Secondary | ICD-10-CM | POA: Diagnosis not present

## 2017-09-17 DIAGNOSIS — I700 Atherosclerosis of aorta: Secondary | ICD-10-CM | POA: Diagnosis not present

## 2017-10-13 ENCOUNTER — Other Ambulatory Visit: Payer: Self-pay

## 2017-10-13 ENCOUNTER — Encounter
Admission: RE | Admit: 2017-10-13 | Discharge: 2017-10-13 | Disposition: A | Discharge status: Home / Self Care | Payer: Medicare HMO | Source: Ambulatory Visit | Attending: Urology | Admitting: Urology

## 2017-10-13 NOTE — Patient Instructions (Addendum)
  Your procedure is scheduled on: 10-21-17 TUESDAY Report to Same Day Surgery 2nd floor medical mall [facility] Entrance-take elevator on left to 2nd floor.  Check in with surgery information desk.) To find out your arrival time please call [phone] between 1PM - 3PM on 10-20-17 MONDAY  Remember: Instructions that are not followed completely may result in serious medical risk, up to and including death, or upon the discretion of your surgeon and anesthesiologist your surgery may need to be rescheduled.    _x___ 1. Do not eat food after midnight the night before your procedure. NO GUM OR CANDY AFTER MIDNIGHT.  You may drink WATER up to 2 hours before you are scheduled to arrive at the hospital for your procedure.  Do not drink WATER within 2 hours of your scheduled arrival to the hospital.  Type 1 and type 2 diabetics should only drink water.     __x__ 2. No Alcohol for 24 hours before or after surgery.   __x__3. No Smoking for 24 prior to surgery.   ____  4. Bring all medications with you on the day of surgery if instructed.    __x__ 5. Notify your doctor if there is any change in your medical condition     (cold, fever, infections).     Do not wear jewelry, make-up, hairpins, clips or nail polish.  Do not wear lotions, powders, or perfumes. You may wear deodorant.  Do not shave 48 hours prior to surgery. Men may shave face and neck.  Do not bring valuables to the hospital.    [facility] is not responsible for any belongings or valuables.               Contacts, dentures or bridgework may not be worn into surgery.  Leave your suitcase in the car. After surgery it may be brought to your room.  For patients admitted to the hospital, discharge time is determined by your treatment team.   Patients discharged the day of surgery will not be allowed to drive home.  You will need someone to drive you home and stay with you the night of your procedure.    Please read over the  following fact sheets that you were given:   [facility] Preparing for Surgery and or MRSA Information   _x___ TAKE THE FOLLOWING MEDICATION THE MORNING OF SURGERY WITH A SMALL SIP OF WATER. These include:  1. COREG (CARVEDILOL)  2.  3.  4.  5.  6.  ____Fleets enema or Magnesium Citrate as directed.   ____ Use CHG Soap or sage wipes as directed on instruction sheet   ____ Use inhalers on the day of surgery and bring to hospital day of surgery  ____ Stop Metformin and Janumet 2 days prior to surgery.    _X___ Take 1/2 of usual insulin dose the night before surgery and none on the morning surgery-TAKE HALF OF YOUR LANTUS ON Monday NIGHT AND NO INSULIN THE MORNING OF SURGERY  _x___ Follow recommendations from Cardiologist, Pulmonologist or PCP regarding stopping Aspirin, Coumadin, Plavix ,Eliquis, Effient, or Pradaxa, and Pletal-PT TO STOP ASPIRIN ON 10-13-17 AND PT HAS ALREADY STOPPED PLAVIX (PER DR WOLFF'S INSTRUCTIONS)  X____Stop Anti-inflammatories such as Advil, Aleve, Ibuprofen, Motrin, NAPROXEN, Naprosyn, Goodies powders or aspirin products NOW- OK to take Tylenol   ____ Stop supplements until after surgery.   ____ Bring C-Pap to the hospital.

## 2017-10-16 ENCOUNTER — Encounter
Admission: RE | Admit: 2017-10-16 | Discharge: 2017-10-16 | Disposition: A | Discharge status: Home / Self Care | Payer: Medicare HMO | Source: Ambulatory Visit | Attending: Urology | Admitting: Urology

## 2017-10-16 DIAGNOSIS — C679 Malignant neoplasm of bladder, unspecified: Secondary | ICD-10-CM | POA: Diagnosis not present

## 2017-10-16 LAB — BASIC METABOLIC PANEL
Anion gap: 7 (ref 5–15)
BUN: 21 mg/dL — AB (ref 6–20)
CALCIUM: 9.1 mg/dL (ref 8.9–10.3)
CO2: 27 mmol/L (ref 22–32)
CREATININE: 0.76 mg/dL (ref 0.61–1.24)
Chloride: 104 mmol/L (ref 101–111)
GFR calc Af Amer: >60 mL/min (ref >60)
GLUCOSE: 101 mg/dL — AB (ref 65–99)
Potassium: 4.3 mmol/L (ref 3.5–5.1)
Sodium: 138 mmol/L (ref 135–145)

## 2017-10-17 NOTE — H&P (Signed)
NAME:  [name], [name]          ACCOUNT NO.:  [id number]  MEDICAL RECORD NO.:  [id number]  LOCATION:                                 FACILITY:  PHYSICIAN:  Maryan Puls          DATE OF BIRTH:  1946/01/05  DATE OF ADMISSION:  10/21/2017 DATE OF DISCHARGE:                            HISTORY AND PHYSICAL   Same-day surgery, October 21, 2017.  CHIEF COMPLAINT:  Bladder cancer.  HISTORY OF PRESENT ILLNESS:  Mr. [name] is a 71 year old white male with a history of high-grade micropapillary urothelial carcinoma with lamina propria invasion, but no muscle invasion.  The patient preferred bladder sparing treatment and has undergone intravesical mitomycin bladder installations weekly from October 2nd through November 6th.  The patient comes in now for cystoscopy and bladder biopsy.  PAST MEDICAL HISTORY:  No drug allergies.  CURRENT MEDICATIONS:  Plavix, losartan, Lasix, carvedilol, multivitamins, and aspirin.  SURGICAL HISTORY:  Coronary artery stent placement in 2006, transurethral resection of bladder tumor on July 15, 2017.  SOCIAL HISTORY:  The patient quit smoking in 1998 with a 15 pack-year history.  He denied alcohol use.  FAMILY HISTORY:  The patient's father died of myocardial infarction at age 40.  Mother died of heart disease at age 71.  PAST AND CURRENT MEDICAL CONDITIONS: 1. Coronary artery disease, status post myocardial infarction in 2006. 2. Diabetes since 1967. 3. Hypertension.  REVIEW OF SYSTEMS:  The patient reports bruising easily.  He denied chest pain, shortness of breath, or stroke.  PHYSICAL EXAMINATION:  GENERAL:  A well-nourished white male, in no acute distress. HEENT:  Sclerae were clear. NECK:  Supple.  No palpable cervical adenopathy.  No audible carotid bruits. LUNGS:  Clear to auscultation. CARDIOVASCULAR:  Regular rhythm and rate without audible murmurs. ABDOMEN:  Soft, nontender abdomen. GU:  Circumcised.  Testes were  smooth, nontender, 16 mL size each. RECTAL:  40 g, smooth, nontender prostate. NEUROMUSCULAR:  Alert and oriented x3.  IMPRESSION:  Bladder cancer.  PLAN:  Cystoscopy with bladder biopsy.          ______________________________ Maryan Puls     MW/MEDQ  D:  10/16/2017  T:  10/16/2017  Job:  [id number]

## 2017-10-20 NOTE — Pre-Procedure Instructions (Signed)
[name] Merritts, MD  Physician  Cardiology  Progress Notes  Signed  Encounter Date:  04/25/2017          Signed      Expand All Collapse All       [] Hide copied text  [] Hover for details   Patient ID: [name], male   DOB: 1946/01/12, 71 y.o.   MRN: [id number] Cardiology Office Note  Date:  04/25/2017   ID:  [name], DOB 1946/02/27, MRN [id number]  PCP:  [name], MD           Chief Complaint  Patient presents with  . other    12 month follow up. Meds reviewed by the pt. verbally. "doing well."     HPI:  [name] is a very pleasant 71 year old gentleman with a history of coronary artery disease,  occluded LAD with stent placed February 2008 ( Cypher 3.0 x 8 mm DES stent),  poorly controlled diabetes, hypertension  who presents for routine followup of his coronary artery disease  In follow-up today he reports that he feels well with no complaints Active on a horse farm Denies having any significant bruising or bleeding on Plavix with aspirin  Lab work reviewed with him total cholesterol 140, LDL 75, hemoglobin A1c 7.9 Followed by endocrinology  EKG on today's visit showing normal sinus rhythm rate 69 bpm old anterior MI, T-wave abnormality No change from previous EKGs  Other past medical history He is active on a horse farm.  His previous angina was described as posterior neck pain.  On previous visits, he reported having night sweats. We held his carvedilol and Lipitor. Night sweats did not improve. told he had infection in his gums and has had significant work done. night sweats have improved    Cardiac catheter report from 2008 details 25% ostial left main, 100% mid LAD with stent placed at this location,40% proximal circumflex, 20% PL vessel disease Previous stress test showing scar in the mid to distal anterior wall. Previous lab work; Hemoglobin A1c 8.2, total cholesterol 134, LDL 65  PMH:    has a past medical history of Arthritis; Cancer ([place]); Coronary artery disease; Diabetes mellitus; Edema; GERD (gastroesophageal reflux disease); Heart attack ([place]) (2007); Hyperlipidemia; Hypertension; Hypoglycemia; Ischemic cardiomyopathy; and Myocardial infarction ([place]).  PSH:         Past Surgical History:  Procedure Laterality Date  . CARDIAC CATHETERIZATION    . CORONARY ANGIOPLASTY    . CORONARY STENT PLACEMENT     To an occluded LAD x2  . EXCISION MASS LOWER EXTREMETIES Right 06/05/2016   Procedure: EXCISION OF LESION RIGHT LOWER LEG;  Surgeon: Hubbard Robinson, MD;  Location: [facility] ORS;  Service: General;  Laterality: Right;  . SKIN CANCER EXCISION     chest          Current Outpatient Prescriptions  Medication Sig Dispense Refill  . aspirin 81 MG tablet Take 81 mg by mouth daily.     Marland Kitchen atorvastatin (LIPITOR) 80 MG tablet take 1 tablet by mouth at bedtime 90 tablet 3  . carvedilol (COREG) 12.5 MG tablet take 1 tablet by mouth twice a day with food 180 tablet 3  . clopidogrel (PLAVIX) 75 MG tablet take 1 tablet by mouth once daily 90 tablet 0  . furosemide (LASIX) 20 MG tablet take 1 tablet by mouth once daily 90 tablet 3  . glucagon (GLUCAGON EMERGENCY) 1 MG injection Inject 1 mg into the skin once as needed.     Marland Kitchen  ibuprofen (ADVIL,MOTRIN) 800 MG tablet Take 1 tablet (800 mg total) by mouth every 8 (eight) hours as needed. 30 tablet 0  . insulin glargine (LANTUS) 100 UNIT/ML injection Inject 20 Units into the skin every morning.     . insulin glargine (LANTUS) 100 UNIT/ML injection Inject 7 Units into the skin at bedtime.     Marland Kitchen losartan (COZAAR) 50 MG tablet Take 25 mg by mouth every morning.     . multivitamin (THERAGRAN) per tablet Take 1 tablet by mouth daily.      . nitroGLYCERIN (NITROSTAT) 0.4 MG SL tablet Place 0.4 mg under the tongue every 5 (five) minutes as needed. Do not exceed 3 doses in 15 minutes.     Marland Kitchen NOVOLOG 100 UNIT/ML injection  inject 2 to 10 units three times a day before meals as directed  0  . vitamin B-12 (CYANOCOBALAMIN) 1000 MCG tablet Take 1,000 mcg by mouth daily.       No current facility-administered medications for this visit.      Allergies:   Patient has no known allergies.   Social History:  The patient  reports that he quit smoking about 21 years ago. His smoking use included Cigarettes. He has a 10.00 pack-year smoking history. He has never used smokeless tobacco. He reports that he drinks alcohol. He reports that he does not use drugs.   Family History:   family history includes Heart attack in his mother; Heart disease in his father and mother; Prostate cancer in his brother.    Review of Systems: Review of Systems  Constitutional: Negative.   Respiratory: Negative.   Cardiovascular: Negative.   Gastrointestinal: Negative.   Musculoskeletal: Negative.   Neurological: Negative.   Psychiatric/Behavioral: Negative.   All other systems reviewed and are negative.    PHYSICAL EXAM: VS:  BP 120/60 (BP Location: Left Arm, Patient Position: Sitting, Cuff Size: Normal)   Pulse 74   Ht 5\' 9"  (1.753 m)   Wt 188 lb 8 oz (85.5 kg)   BMI 27.84 kg/m  , BMI Body mass index is 27.84 kg/m. GEN: Well nourished, well developed, in no acute distress  HEENT: normal  Neck: no JVD, carotid bruits, or masses Cardiac: RRR; no murmurs, rubs, or gallops,no edema  Respiratory:  clear to auscultation bilaterally, normal work of breathing GI: soft, nontender, nondistended, + BS MS: no deformity or atrophy  Skin: warm and dry, no rash Neuro:  Strength and sensation are intact Psych: euthymic mood, full affect    Recent Labs: No results found for requested labs within last 8760 hours.    Lipid Panel RecentLabs       Lab Results  Component Value Date   CHOL 146 01/03/2011   HDL 53 01/03/2011   LDLCALC 79 01/03/2011   TRIG 70 01/03/2011           Wt Readings from  Last 3 Encounters:  04/25/17 188 lb 8 oz (85.5 kg)  06/25/16 193 lb (87.5 kg)  06/05/16 194 lb (88 kg)       ASSESSMENT AND PLAN:  Atherosclerosis of native coronary artery of native heart without angina pectoris - Plan: EKG 12-Lead Currently with no symptoms of angina. No further workup at this time. Continue current medication regimen.  HYPERTENSION, BENIGN - Plan: EKG 12-Lead Blood pressure is well controlled on today's visit. No changes made to the medications. He takes losartan 25 mg daily Occasionally holds the medications for low blood pressure  Hyperlipidemia LDL slightly above goal,  we did offer Zetia He is comfortable where his numbers are  Controlled type 2 diabetes mellitus with other circulatory complication, without long-term current use of insulin ([name])  hemoglobin A1c 7.9 He has had diabetes for 50 years Followed by endocrinology   Total encounter time more than 15 minutes  Greater than 50% was spent in counseling and coordination of care with the patient   Disposition:   F/U  12 months      Orders Placed This Encounter  Procedures  . EKG 12-Lead    Total encounter time more than 15 minutes  Greater than 50% was spent in counseling and coordination of care with the patient   Signed, Esmond Plants, M.D., Ph.D. 04/25/2017  [place]             Electronically signed by [name] Merritts, MD at 04/25/2017 12:50 PM     Office Visit on 04/25/2017        Detailed Report

## 2017-10-21 ENCOUNTER — Ambulatory Visit
Admission: RE | Admit: 2017-10-21 | Discharge: 2017-10-21 | Disposition: A | Discharge status: Home / Self Care | Payer: Medicare HMO | Source: Ambulatory Visit | Attending: Urology | Admitting: Urology

## 2017-10-21 ENCOUNTER — Ambulatory Visit: Payer: Medicare HMO | Admitting: Certified Registered Nurse Anesthetist

## 2017-10-21 ENCOUNTER — Encounter
Admission: RE | Disposition: A | Discharge status: Home / Self Care | Payer: Self-pay | Source: Ambulatory Visit | Attending: Urology

## 2017-10-21 DIAGNOSIS — E109 Type 1 diabetes mellitus without complications: Secondary | ICD-10-CM | POA: Diagnosis not present

## 2017-10-21 DIAGNOSIS — I255 Ischemic cardiomyopathy: Secondary | ICD-10-CM | POA: Insufficient documentation

## 2017-10-21 DIAGNOSIS — Z7982 Long term (current) use of aspirin: Secondary | ICD-10-CM | POA: Diagnosis not present

## 2017-10-21 DIAGNOSIS — Z955 Presence of coronary angioplasty implant and graft: Secondary | ICD-10-CM | POA: Insufficient documentation

## 2017-10-21 DIAGNOSIS — I2510 Atherosclerotic heart disease of native coronary artery without angina pectoris: Secondary | ICD-10-CM | POA: Diagnosis not present

## 2017-10-21 DIAGNOSIS — Z8551 Personal history of malignant neoplasm of bladder: Secondary | ICD-10-CM | POA: Diagnosis not present

## 2017-10-21 DIAGNOSIS — I10 Essential (primary) hypertension: Secondary | ICD-10-CM | POA: Diagnosis not present

## 2017-10-21 DIAGNOSIS — Z7902 Long term (current) use of antithrombotics/antiplatelets: Secondary | ICD-10-CM | POA: Insufficient documentation

## 2017-10-21 DIAGNOSIS — Z79899 Other long term (current) drug therapy: Secondary | ICD-10-CM | POA: Insufficient documentation

## 2017-10-21 DIAGNOSIS — Z794 Long term (current) use of insulin: Secondary | ICD-10-CM | POA: Insufficient documentation

## 2017-10-21 DIAGNOSIS — I252 Old myocardial infarction: Secondary | ICD-10-CM | POA: Insufficient documentation

## 2017-10-21 DIAGNOSIS — C673 Malignant neoplasm of anterior wall of bladder: Secondary | ICD-10-CM | POA: Diagnosis not present

## 2017-10-21 DIAGNOSIS — C679 Malignant neoplasm of bladder, unspecified: Secondary | ICD-10-CM | POA: Diagnosis not present

## 2017-10-21 DIAGNOSIS — Z87891 Personal history of nicotine dependence: Secondary | ICD-10-CM | POA: Insufficient documentation

## 2017-10-21 DIAGNOSIS — N3020 Other chronic cystitis without hematuria: Secondary | ICD-10-CM | POA: Insufficient documentation

## 2017-10-21 DIAGNOSIS — Z791 Long term (current) use of non-steroidal anti-inflammatories (NSAID): Secondary | ICD-10-CM | POA: Diagnosis not present

## 2017-10-21 HISTORY — PX: CYSTOSCOPY WITH BIOPSY: SHX5122

## 2017-10-21 LAB — GLUCOSE, CAPILLARY
GLUCOSE-CAPILLARY: 275 mg/dL — AB (ref 65–99)
Glucose-Capillary: 291 mg/dL — ABNORMAL HIGH (ref 65–99)
Glucose-Capillary: 283 mg/dL — ABNORMAL HIGH (ref 65–99)

## 2017-10-21 SURGERY — CYSTOSCOPY, WITH BIOPSY
Anesthesia: General | Site: Bladder | Wound class: Clean Contaminated

## 2017-10-21 MED ORDER — ACETAMINOPHEN 10 MG/ML IV SOLN
INTRAVENOUS | Status: DC | PRN
  Administered 2017-10-21: 1000 mg via INTRAVENOUS

## 2017-10-21 MED ORDER — CEFAZOLIN SODIUM 1 G IJ SOLR
INTRAMUSCULAR | Status: AC
  Filled 2017-10-21: qty 10

## 2017-10-21 MED ORDER — OXYCODONE HCL 5 MG PO TABS: 5.0000 mg | ORAL_TABLET | Freq: Once | ORAL | Status: DC | PRN

## 2017-10-21 MED ORDER — LIDOCAINE HCL (CARDIAC) 20 MG/ML IV SOLN
INTRAVENOUS | Status: DC | PRN
  Administered 2017-10-21: 100 mg via INTRAVENOUS

## 2017-10-21 MED ORDER — LIDOCAINE HCL 2 % EX GEL
CUTANEOUS | Status: DC | PRN
  Administered 2017-10-21: 1 via URETHRAL

## 2017-10-21 MED ORDER — OXYCODONE HCL 5 MG/5ML PO SOLN: 5.0000 mg | Freq: Once | ORAL | Status: DC | PRN

## 2017-10-21 MED ORDER — MEPERIDINE HCL 50 MG/ML IJ SOLN: 6.2500 mg | INTRAMUSCULAR | Status: DC | PRN

## 2017-10-21 MED ORDER — PROPOFOL 10 MG/ML IV BOLUS
INTRAVENOUS | Status: DC | PRN
  Administered 2017-10-21: 70 mg via INTRAVENOUS
  Administered 2017-10-21: 170 mg via INTRAVENOUS
  Administered 2017-10-21: 30 mg via INTRAVENOUS

## 2017-10-21 MED ORDER — CEFAZOLIN SODIUM-DEXTROSE 1-4 GM/50ML-% IV SOLN
1.0000 g | Freq: Once | INTRAVENOUS | Status: AC
  Administered 2017-10-21: 2 g via INTRAVENOUS

## 2017-10-21 MED ORDER — FAMOTIDINE 20 MG PO TABS
20.0000 mg | ORAL_TABLET | Freq: Once | ORAL | Status: AC
  Administered 2017-10-21: 20 mg via ORAL

## 2017-10-21 MED ORDER — MITOMYCIN CHEMO FOR BLADDER INSTILLATION 40 MG
40.0000 mg | Freq: Once | INTRAVENOUS | Status: AC
  Administered 2017-10-21: 40 mg via INTRAVESICAL
  Filled 2017-10-21: qty 40

## 2017-10-21 MED ORDER — EPHEDRINE SULFATE 50 MG/ML IJ SOLN
INTRAMUSCULAR | Status: DC | PRN
  Administered 2017-10-21: 10 mg via INTRAVENOUS

## 2017-10-21 MED ORDER — FENTANYL CITRATE (PF) 100 MCG/2ML IJ SOLN
INTRAMUSCULAR | Status: AC
  Administered 2017-10-21: 25 ug via INTRAVENOUS
  Filled 2017-10-21: qty 2

## 2017-10-21 MED ORDER — ONDANSETRON HCL 4 MG/2ML IJ SOLN
INTRAMUSCULAR | Status: DC | PRN
  Administered 2017-10-21: 4 mg via INTRAVENOUS

## 2017-10-21 MED ORDER — FAMOTIDINE 20 MG PO TABS
ORAL_TABLET | ORAL | Status: AC
  Filled 2017-10-21: qty 1

## 2017-10-21 MED ORDER — FENTANYL CITRATE (PF) 100 MCG/2ML IJ SOLN
25.0000 ug | INTRAMUSCULAR | Status: DC | PRN
  Administered 2017-10-21 (×4): 25 ug via INTRAVENOUS

## 2017-10-21 MED ORDER — FENTANYL CITRATE (PF) 100 MCG/2ML IJ SOLN
INTRAMUSCULAR | Status: AC
  Filled 2017-10-21: qty 2

## 2017-10-21 MED ORDER — PHENYLEPHRINE HCL 10 MG/ML IJ SOLN
INTRAMUSCULAR | Status: DC | PRN
  Administered 2017-10-21 (×7): 100 ug via INTRAVENOUS

## 2017-10-21 MED ORDER — PROMETHAZINE HCL 25 MG/ML IJ SOLN: 6.2500 mg | INTRAMUSCULAR | Status: DC | PRN

## 2017-10-21 MED ORDER — BELLADONNA ALKALOIDS-OPIUM 16.2-60 MG RE SUPP
RECTAL | Status: AC
  Filled 2017-10-21: qty 1

## 2017-10-21 MED ORDER — CEFAZOLIN SODIUM-DEXTROSE 1-4 GM/50ML-% IV SOLN
INTRAVENOUS | Status: AC
  Filled 2017-10-21: qty 50

## 2017-10-21 MED ORDER — BELLADONNA ALKALOIDS-OPIUM 16.2-60 MG RE SUPP
RECTAL | Status: DC | PRN
  Administered 2017-10-21: 1 via RECTAL

## 2017-10-21 MED ORDER — SODIUM CHLORIDE 0.9 % IV SOLN
INTRAVENOUS | Status: DC
  Administered 2017-10-21 (×2): via INTRAVENOUS

## 2017-10-21 MED ORDER — DEXAMETHASONE SODIUM PHOSPHATE 10 MG/ML IJ SOLN
INTRAMUSCULAR | Status: DC | PRN
  Administered 2017-10-21: 5 mg via INTRAVENOUS

## 2017-10-21 MED ORDER — ACETAMINOPHEN 10 MG/ML IV SOLN
INTRAVENOUS | Status: AC
  Filled 2017-10-21: qty 100

## 2017-10-21 SURGICAL SUPPLY — 21 items
BAG DRAIN CYSTO-URO LG1000N (MISCELLANEOUS) ×3 IMPLANT
CATH FOL 2WAY LX 18X30 (CATHETERS) ×3 IMPLANT
DRSG TELFA 4X3 1S NADH ST (GAUZE/BANDAGES/DRESSINGS) ×3 IMPLANT
ELECT REM PT RETURN 9FT ADLT (ELECTROSURGICAL) ×3
ELECTRODE REM PT RTRN 9FT ADLT (ELECTROSURGICAL) ×1 IMPLANT
GLOVE BIO SURGEON STRL SZ7 (GLOVE) ×3 IMPLANT
GLOVE BIO SURGEON STRL SZ7.5 (GLOVE) ×3 IMPLANT
GOWN STRL REUS W/ TWL LRG LVL3 (GOWN DISPOSABLE) ×1 IMPLANT
GOWN STRL REUS W/ TWL XL LVL3 (GOWN DISPOSABLE) ×1 IMPLANT
GOWN STRL REUS W/TWL LRG LVL3 (GOWN DISPOSABLE) ×2
GOWN STRL REUS W/TWL XL LVL3 (GOWN DISPOSABLE) ×2
KIT RM TURNOVER CYSTO AR (KITS) ×3 IMPLANT
NEEDLE HYPO 22GX1.5 SAFETY (NEEDLE) IMPLANT
PACK CYSTO AR (MISCELLANEOUS) ×3 IMPLANT
PREP PVP WINGED SPONGE (MISCELLANEOUS) IMPLANT
SET IRRIG Y TYPE TUR BLADDER L (SET/KITS/TRAYS/PACK) IMPLANT
SOL PREP PVP 2OZ (MISCELLANEOUS)
SOLUTION PREP PVP 2OZ (MISCELLANEOUS) IMPLANT
SYRINGE IRR TOOMEY STRL 70CC (SYRINGE) IMPLANT
WATER STERILE IRR 1000ML POUR (IV SOLUTION) ×3 IMPLANT
WATER STERILE IRR 3000ML UROMA (IV SOLUTION) ×3 IMPLANT

## 2017-10-21 NOTE — H&P (Signed)
Date of Initial H&P: 10/16/17  History reviewed, patient examined, no change in status, stable for surgery.

## 2017-10-21 NOTE — Anesthesia Post-op Follow-up Note (Signed)
Anesthesia QCDR form completed.        

## 2017-10-21 NOTE — Op Note (Signed)
Preoperative diagnosis: Bladder cancer   Postoperative diagnosis: Same  Procedure: 1. Cystoscopy with bladder biopsy                      2.  Foley catheter placement                      3.  Instillation of mitomycin-C 40 mg into the bladder   Surgeon: Otelia Limes. Yves Dill MD  Anesthesia: General  Indications:See the history and physical. After informed consent the above procedure(s) were requested     Technique and findings: After adequate general anesthesia had been obtained the patient was placed into dorsal lithotomy position and the perineum was prepped and draped in the usual fashion.  The 21 French cystoscope was coupled with a camera and then visually advanced into the bladder.  The bladder was thoroughly inspected.  Both ureteral orifices were identified and had clear efflux.  The patient had moderate lateral lobe prostatic hypertrophy.  No bladder tumors were identified.  A posterior wall right bladder diverticulum was identified.  No tumors were seen within the bladder or in the diverticulum.  The prior tumor resection site was identified.  Cold cup biopsies were performed superficially and deep submitted to pathology.  The biopsy sites were cauterized with the Bugbee electrode.  At this point the bladder was drained and the cystoscope was removed.  10 cc of viscous Xylocaine was instilled within the urethra and the bladder.  96 French Foley catheter was placed and irrigated until clear.  40 milligrams of mitomycin-C was then instilled within the bladder and the catheter was clamped.  A B and O suppository was placed.  The procedure was then terminated and patient transferred to the recovery room in stable condition.

## 2017-10-21 NOTE — Anesthesia Procedure Notes (Signed)
Procedure Name: LMA Insertion Date/Time: 10/21/2017 1:06 PM Performed by: Lowry Bowl, CRNA Pre-anesthesia Checklist: Patient identified, Emergency Drugs available, Suction available and Patient being monitored Patient Re-evaluated:Patient Re-evaluated prior to induction Oxygen Delivery Method: Circle system utilized Preoxygenation: Pre-oxygenation with 100% oxygen Induction Type: IV induction Ventilation: Mask ventilation without difficulty LMA: LMA inserted LMA Size: 5.0 Number of attempts: 1 Placement Confirmation: positive ETCO2 and breath sounds checked- equal and bilateral Tube secured with: Tape Dental Injury: Teeth and Oropharynx as per pre-operative assessment

## 2017-10-21 NOTE — Discharge Instructions (Addendum)
AMBULATORY SURGERY  DISCHARGE INSTRUCTIONS   1) The drugs that you were given will stay in your system until tomorrow so for the next 24 hours you should not:  A) Drive an automobile B) Make any legal decisions C) Drink any alcoholic beverage   2) You may resume regular meals tomorrow.  Today it is better to start with liquids and gradually work up to solid foods.  You may eat anything you prefer, but it is better to start with liquids, then soup and crackers, and gradually work up to solid foods.   3) Please notify your doctor immediately if you have any unusual bleeding, trouble breathing, redness and pain at the surgery site, drainage, fever, or pain not relieved by medication.     Transurethral Resection of Bladder Tumor, Care After Refer to this sheet in the next few weeks. These instructions provide you with information about caring for yourself after your procedure. Your health care provider may also give you more specific instructions. Your treatment has been planned according to current medical practices, but problems sometimes occur. Call your health care provider if you have any problems or questions after your procedure. What can I expect after the procedure? After the procedure, it is common to have:  A small amount of blood in your urine for up to 2 weeks.  Soreness or mild discomfort from your catheter. After your catheter is removed, you may have mild soreness, especially when urinating.  Pain in your lower abdomen.  Follow these instructions at home:  Medicines  Take over-the-counter and prescription medicines only as told by your health care provider.  Do not drive or operate heavy machinery while taking prescription pain medicine.  Do not drive for 24 hours if you received a sedative.  If you were prescribed antibiotic medicine, take it as told by your health care provider. Do not stop taking the antibiotic even if you start to feel  better. Activity  Return to your normal activities as told by your health care provider. Ask your health care provider what activities are safe for you.  Do not lift anything that is heavier than 10 lb (4.5 kg) for as long as told by your health care provider.  Avoid intense physical activity for as long as told by your health care provider.  Walk at least one time every day. This helps to prevent blood clots. You may increase your physical activity gradually as you start to feel better. General instructions  Do not drink alcohol for as long as told by your health care provider. This is especially important if you are taking prescription pain medicines.  Do not take baths, swim, or use a hot tub until your health care provider approves.  If you have a catheter, follow instructions from your health care provider about caring for your catheter and your drainage bag.  Drink enough fluid to keep your urine clear or pale yellow.  Wear compression stockings as told by your health care provider. These stockings help to prevent blood clots and reduce swelling in your legs.  Keep all follow-up visits as told by your health care provider. This is important. Contact a health care provider if:  You have pain that gets worse or does not improve with medicine.  You have blood in your urine for more than 2 weeks.  You have cloudy or bad-smelling urine.  You become constipated. Signs of constipation may include having: ? Fewer than three bowel movements in a week. ? Difficulty  having a bowel movement. ? Stools that are dry, hard, or larger than normal.  You have a fever. Get help right away if:  You have: ? Severe pain. ? Bright-red blood in your urine. ? Blood clots in your urine. ? A lot of blood in your urine.  Your catheter has been removed and you are not able to urinate.  You have a catheter in place and the catheter is not draining urine. This information is not intended to  replace advice given to you by your health care provider. Make sure you discuss any questions you have with your health care provider. Document Released: 10/09/2015 Document Revised: 06/30/2016 Document Reviewed: 07/20/2015 Elsevier Interactive Patient Education  2018 [employer].

## 2017-10-21 NOTE — Anesthesia Preprocedure Evaluation (Signed)
Anesthesia Evaluation  Patient identified by MRN, date of birth, ID band Patient awake    Reviewed: Allergy & Precautions, NPO status , Patient's Chart, lab work & pertinent test results  History of Anesthesia Complications Negative for: history of anesthetic complications  Airway Mallampati: II  TM Distance: >3 FB Neck ROM: Full    Dental no notable dental hx.    Pulmonary neg sleep apnea, neg COPD, former smoker,    breath sounds clear to auscultation- rhonchi (-) wheezing      Cardiovascular hypertension, (-) angina+ CAD, + Past MI and + Cardiac Stents (2010)   Rhythm:Regular Rate:Normal - Systolic murmurs and - Diastolic murmurs    Neuro/Psych negative neurological ROS  negative psych ROS   GI/Hepatic Neg liver ROS, GERD  ,  Endo/Other  diabetes, Type 1, Insulin Dependent  Renal/GU negative Renal ROS     Musculoskeletal  (+) Arthritis ,   Abdominal (+) - obese,   Peds  Hematology  (+) anemia ,   Anesthesia Other Findings Past Medical History: No date: Anemia No date: Arthritis     Comment:  right knee No date: Cancer [facility])     Comment:  BLADDER CANCER (2018)skin cancer No date: Coronary artery disease No date: Diabetes mellitus No date: Edema No date: GERD (gastroesophageal reflux disease)     Comment:  rare-NO MEDS 2007: Heart attack ([place]) No date: Hyperlipidemia No date: Hypertension No date: Hypoglycemia     Comment:  history of episodes x2 No date: Ischemic cardiomyopathy No date: Myocardial infarction ([place])   Reproductive/Obstetrics                             Anesthesia Physical Anesthesia Plan  ASA: II  Anesthesia Plan: General   Post-op Pain Management:    Induction: Intravenous  PONV Risk Score and Plan: 1 and Dexamethasone and Ondansetron  Airway Management Planned: LMA  Additional Equipment:   Intra-op Plan:   Post-operative Plan:   Informed  Consent: I have reviewed the patients History and Physical, chart, labs and discussed the procedure including the risks, benefits and alternatives for the proposed anesthesia with the patient or authorized representative who has indicated his/her understanding and acceptance.   Dental advisory given  Plan Discussed with: CRNA and Anesthesiologist  Anesthesia Plan Comments:         Anesthesia Quick Evaluation

## 2017-10-21 NOTE — Progress Notes (Signed)
mytomycin drained from bladder , foley catheter removed .

## 2017-10-21 NOTE — Transfer of Care (Signed)
Immediate Anesthesia Transfer of Care Note  Patient: [name]  Procedure(s) Performed: CYSTOSCOPY WITH BLADDER BIOPSY (N/A Bladder)  Patient Location: PACU  Anesthesia Type:General  Level of Consciousness: drowsy and patient cooperative  Airway & Oxygen Therapy: Patient Spontanous Breathing and Patient connected to face mask oxygen  Post-op Assessment: Report given to RN, Post -op Vital signs reviewed and stable and Patient moving all extremities  Post vital signs: Reviewed and stable  Last Vitals:  Vitals:   10/21/17 1206  BP: (!) 145/68  Pulse: 84  Resp: 17  Temp: 36.5 C  SpO2: 100%    Last Pain:  Vitals:   10/21/17 1206  TempSrc: Oral         Complications: No apparent anesthesia complications

## 2017-10-22 ENCOUNTER — Encounter: Payer: Self-pay | Admitting: Urology

## 2017-10-22 NOTE — Anesthesia Postprocedure Evaluation (Signed)
Anesthesia Post Note  Patient: [name]  Procedure(s) Performed: CYSTOSCOPY WITH BLADDER BIOPSY (N/A Bladder)  Patient location during evaluation: PACU Anesthesia Type: General Level of consciousness: awake and alert and oriented Pain management: pain level controlled Vital Signs Assessment: post-procedure vital signs reviewed and stable Respiratory status: spontaneous breathing, nonlabored ventilation and respiratory function stable Cardiovascular status: blood pressure returned to baseline and stable Postop Assessment: no signs of nausea or vomiting Anesthetic complications: no     Last Vitals:  Vitals:   10/21/17 1512 10/21/17 1530  BP: 136/70 133/70  Pulse: 78 74  Resp: 12 12  Temp: (!) 36.2 C   SpO2: 99%     Last Pain:  Vitals:   10/21/17 1530  TempSrc:   PainSc: 0-No pain                 Marcina Kinnison

## 2017-10-23 LAB — SURGICAL PATHOLOGY

## 2017-10-28 DIAGNOSIS — Z8551 Personal history of malignant neoplasm of bladder: Secondary | ICD-10-CM | POA: Diagnosis not present

## 2017-10-29 DIAGNOSIS — E1059 Type 1 diabetes mellitus with other circulatory complications: Secondary | ICD-10-CM | POA: Diagnosis not present

## 2017-11-05 DIAGNOSIS — R809 Proteinuria, unspecified: Secondary | ICD-10-CM | POA: Diagnosis not present

## 2017-11-05 DIAGNOSIS — E1029 Type 1 diabetes mellitus with other diabetic kidney complication: Secondary | ICD-10-CM | POA: Diagnosis not present

## 2017-11-05 DIAGNOSIS — E1059 Type 1 diabetes mellitus with other circulatory complications: Secondary | ICD-10-CM | POA: Diagnosis not present

## 2017-11-12 DIAGNOSIS — C674 Malignant neoplasm of posterior wall of bladder: Secondary | ICD-10-CM | POA: Diagnosis not present

## 2017-11-19 DIAGNOSIS — C674 Malignant neoplasm of posterior wall of bladder: Secondary | ICD-10-CM | POA: Diagnosis not present

## 2017-11-26 DIAGNOSIS — C673 Malignant neoplasm of anterior wall of bladder: Secondary | ICD-10-CM | POA: Diagnosis not present

## 2017-11-26 DIAGNOSIS — R300 Dysuria: Secondary | ICD-10-CM | POA: Diagnosis not present

## 2017-11-26 DIAGNOSIS — N390 Urinary tract infection, site not specified: Secondary | ICD-10-CM | POA: Diagnosis not present

## 2017-12-02 DIAGNOSIS — C673 Malignant neoplasm of anterior wall of bladder: Secondary | ICD-10-CM | POA: Diagnosis not present

## 2017-12-02 DIAGNOSIS — R300 Dysuria: Secondary | ICD-10-CM | POA: Diagnosis not present

## 2017-12-02 DIAGNOSIS — Z79899 Other long term (current) drug therapy: Secondary | ICD-10-CM | POA: Diagnosis not present

## 2017-12-09 DIAGNOSIS — N390 Urinary tract infection, site not specified: Secondary | ICD-10-CM | POA: Diagnosis not present

## 2017-12-09 DIAGNOSIS — R300 Dysuria: Secondary | ICD-10-CM | POA: Diagnosis not present

## 2017-12-09 DIAGNOSIS — C674 Malignant neoplasm of posterior wall of bladder: Secondary | ICD-10-CM | POA: Diagnosis not present

## 2017-12-16 DIAGNOSIS — N390 Urinary tract infection, site not specified: Secondary | ICD-10-CM | POA: Diagnosis not present

## 2017-12-16 DIAGNOSIS — R300 Dysuria: Secondary | ICD-10-CM | POA: Diagnosis not present

## 2017-12-16 DIAGNOSIS — C674 Malignant neoplasm of posterior wall of bladder: Secondary | ICD-10-CM | POA: Diagnosis not present

## 2017-12-22 DIAGNOSIS — R350 Frequency of micturition: Secondary | ICD-10-CM | POA: Diagnosis not present

## 2017-12-22 DIAGNOSIS — R300 Dysuria: Secondary | ICD-10-CM | POA: Diagnosis not present

## 2017-12-24 ENCOUNTER — Other Ambulatory Visit: Payer: Self-pay | Admitting: Orthopedic Surgery

## 2017-12-24 DIAGNOSIS — R310 Gross hematuria: Secondary | ICD-10-CM

## 2017-12-31 ENCOUNTER — Ambulatory Visit
Admission: RE | Admit: 2017-12-31 | Discharge: 2017-12-31 | Disposition: A | Discharge status: Home / Self Care | Payer: Medicare HMO | Source: Ambulatory Visit | Attending: Orthopedic Surgery | Admitting: Orthopedic Surgery

## 2017-12-31 DIAGNOSIS — Z8551 Personal history of malignant neoplasm of bladder: Secondary | ICD-10-CM | POA: Diagnosis not present

## 2017-12-31 DIAGNOSIS — R310 Gross hematuria: Secondary | ICD-10-CM | POA: Diagnosis not present

## 2017-12-31 DIAGNOSIS — Z9889 Other specified postprocedural states: Secondary | ICD-10-CM | POA: Diagnosis not present

## 2017-12-31 DIAGNOSIS — R918 Other nonspecific abnormal finding of lung field: Secondary | ICD-10-CM | POA: Diagnosis not present

## 2017-12-31 DIAGNOSIS — C679 Malignant neoplasm of bladder, unspecified: Secondary | ICD-10-CM | POA: Diagnosis not present

## 2017-12-31 LAB — POCT I-STAT CREATININE: CREATININE: 1 mg/dL (ref 0.61–1.24)

## 2017-12-31 MED ORDER — IOPAMIDOL (ISOVUE-300) INJECTION 61%
125.0000 mL | Freq: Once | INTRAVENOUS | Status: AC | PRN
  Administered 2017-12-31: 125 mL via INTRAVENOUS

## 2018-01-02 DIAGNOSIS — Z8551 Personal history of malignant neoplasm of bladder: Secondary | ICD-10-CM | POA: Diagnosis not present

## 2018-01-02 DIAGNOSIS — R310 Gross hematuria: Secondary | ICD-10-CM | POA: Diagnosis not present

## 2018-01-07 DIAGNOSIS — R350 Frequency of micturition: Secondary | ICD-10-CM | POA: Diagnosis not present

## 2018-01-07 DIAGNOSIS — N401 Enlarged prostate with lower urinary tract symptoms: Secondary | ICD-10-CM | POA: Diagnosis not present

## 2018-01-07 DIAGNOSIS — C674 Malignant neoplasm of posterior wall of bladder: Secondary | ICD-10-CM | POA: Diagnosis not present

## 2018-01-13 DIAGNOSIS — C674 Malignant neoplasm of posterior wall of bladder: Secondary | ICD-10-CM | POA: Diagnosis not present

## 2018-01-22 DIAGNOSIS — C674 Malignant neoplasm of posterior wall of bladder: Secondary | ICD-10-CM | POA: Diagnosis not present

## 2018-01-22 DIAGNOSIS — N401 Enlarged prostate with lower urinary tract symptoms: Secondary | ICD-10-CM | POA: Diagnosis not present

## 2018-02-11 DIAGNOSIS — L97521 Non-pressure chronic ulcer of other part of left foot limited to breakdown of skin: Secondary | ICD-10-CM | POA: Diagnosis not present

## 2018-02-16 ENCOUNTER — Other Ambulatory Visit: Payer: Self-pay

## 2018-02-16 MED ORDER — CARVEDILOL 12.5 MG PO TABS: ORAL_TABLET | ORAL | 3 refills | Status: DC

## 2018-02-18 ENCOUNTER — Ambulatory Visit
Admission: RE | Admit: 2018-02-18 | Discharge: 2018-02-18 | Disposition: A | Discharge status: Home / Self Care | Payer: Medicare HMO | Source: Ambulatory Visit | Attending: Internal Medicine | Admitting: Internal Medicine

## 2018-02-18 ENCOUNTER — Ambulatory Visit: Payer: Medicare HMO | Admitting: Anesthesiology

## 2018-02-18 ENCOUNTER — Encounter
Admission: RE | Disposition: A | Discharge status: Home / Self Care | Payer: Self-pay | Source: Ambulatory Visit | Attending: Internal Medicine

## 2018-02-18 DIAGNOSIS — K640 First degree hemorrhoids: Secondary | ICD-10-CM | POA: Insufficient documentation

## 2018-02-18 DIAGNOSIS — I252 Old myocardial infarction: Secondary | ICD-10-CM | POA: Diagnosis not present

## 2018-02-18 DIAGNOSIS — E119 Type 2 diabetes mellitus without complications: Secondary | ICD-10-CM | POA: Insufficient documentation

## 2018-02-18 DIAGNOSIS — Z87891 Personal history of nicotine dependence: Secondary | ICD-10-CM | POA: Diagnosis not present

## 2018-02-18 DIAGNOSIS — K648 Other hemorrhoids: Secondary | ICD-10-CM | POA: Diagnosis not present

## 2018-02-18 DIAGNOSIS — I10 Essential (primary) hypertension: Secondary | ICD-10-CM | POA: Insufficient documentation

## 2018-02-18 DIAGNOSIS — Z7902 Long term (current) use of antithrombotics/antiplatelets: Secondary | ICD-10-CM | POA: Diagnosis not present

## 2018-02-18 DIAGNOSIS — E109 Type 1 diabetes mellitus without complications: Secondary | ICD-10-CM | POA: Diagnosis not present

## 2018-02-18 DIAGNOSIS — Z955 Presence of coronary angioplasty implant and graft: Secondary | ICD-10-CM | POA: Diagnosis not present

## 2018-02-18 DIAGNOSIS — Z8551 Personal history of malignant neoplasm of bladder: Secondary | ICD-10-CM | POA: Insufficient documentation

## 2018-02-18 DIAGNOSIS — I2510 Atherosclerotic heart disease of native coronary artery without angina pectoris: Secondary | ICD-10-CM | POA: Diagnosis not present

## 2018-02-18 DIAGNOSIS — Z9221 Personal history of antineoplastic chemotherapy: Secondary | ICD-10-CM | POA: Insufficient documentation

## 2018-02-18 DIAGNOSIS — Z79899 Other long term (current) drug therapy: Secondary | ICD-10-CM | POA: Insufficient documentation

## 2018-02-18 DIAGNOSIS — Z1211 Encounter for screening for malignant neoplasm of colon: Secondary | ICD-10-CM | POA: Diagnosis not present

## 2018-02-18 DIAGNOSIS — Z7982 Long term (current) use of aspirin: Secondary | ICD-10-CM | POA: Diagnosis not present

## 2018-02-18 DIAGNOSIS — E785 Hyperlipidemia, unspecified: Secondary | ICD-10-CM | POA: Diagnosis not present

## 2018-02-18 HISTORY — PX: COLONOSCOPY WITH PROPOFOL: SHX5780

## 2018-02-18 LAB — GLUCOSE, CAPILLARY: GLUCOSE-CAPILLARY: 252 mg/dL — AB (ref 65–99)

## 2018-02-18 SURGERY — COLONOSCOPY WITH PROPOFOL
Anesthesia: General

## 2018-02-18 MED ORDER — PROPOFOL 500 MG/50ML IV EMUL
INTRAVENOUS | Status: AC
  Filled 2018-02-18: qty 50

## 2018-02-18 MED ORDER — PROPOFOL 10 MG/ML IV BOLUS
INTRAVENOUS | Status: DC | PRN
  Administered 2018-02-18: 50 mg via INTRAVENOUS

## 2018-02-18 MED ORDER — LIDOCAINE HCL (CARDIAC) 20 MG/ML IV SOLN
INTRAVENOUS | Status: DC | PRN
  Administered 2018-02-18: 60 mg via INTRAVENOUS

## 2018-02-18 MED ORDER — LIDOCAINE HCL (PF) 1 % IJ SOLN
INTRAMUSCULAR | Status: AC
  Administered 2018-02-18: 0.3 mL via INTRADERMAL
  Filled 2018-02-18: qty 2

## 2018-02-18 MED ORDER — PROPOFOL 500 MG/50ML IV EMUL
INTRAVENOUS | Status: DC | PRN
  Administered 2018-02-18: 130 ug/kg/min via INTRAVENOUS

## 2018-02-18 MED ORDER — SODIUM CHLORIDE 0.9 % IV SOLN
INTRAVENOUS | Status: DC
  Administered 2018-02-18: 1000 mL via INTRAVENOUS

## 2018-02-18 MED ORDER — LIDOCAINE HCL (PF) 2 % IJ SOLN
INTRAMUSCULAR | Status: AC
  Filled 2018-02-18: qty 10

## 2018-02-18 MED ORDER — LIDOCAINE HCL (PF) 1 % IJ SOLN
2.0000 mL | Freq: Once | INTRAMUSCULAR | Status: AC
  Administered 2018-02-18: 0.3 mL via INTRADERMAL

## 2018-02-18 NOTE — Anesthesia Postprocedure Evaluation (Signed)
Anesthesia Post Note  Patient: [name]  Procedure(s) Performed: COLONOSCOPY WITH PROPOFOL (N/A )  Patient location during evaluation: Endoscopy Anesthesia Type: General Level of consciousness: awake and alert Pain management: pain level controlled Vital Signs Assessment: post-procedure vital signs reviewed and stable Respiratory status: spontaneous breathing, nonlabored ventilation and respiratory function stable Cardiovascular status: blood pressure returned to baseline and stable Postop Assessment: no apparent nausea or vomiting Anesthetic complications: no     Last Vitals:  Vitals:   02/18/18 0927 02/18/18 0948  BP: 122/70   Pulse:    Resp: 20 20  Temp:    SpO2: 98% 99%    Last Pain:  Vitals:   02/18/18 0857  TempSrc: Tympanic                 Blessing Ozga Harvie Heck

## 2018-02-18 NOTE — Op Note (Addendum)
[facility] Gastroenterology Patient Name: [name] Procedure Date: 02/18/2018 8:28 AM MRN: [id number] Account #: [id number] Date of Birth: 1946-05-11 Admit Type: Outpatient Age: 72 Room: [facility] ENDO ROOM 4 Gender: Male Note Status: Finalized Procedure:            Colonoscopy Indications:          Screening for colorectal malignant neoplasm Providers:            Benay Pike. Alice Reichert MD, MD Referring MD:         Irven Easterly. Kary Kos, MD (Referring MD) Medicines:            Propofol per Anesthesia Complications:        No immediate complications. Procedure:            Pre-Anesthesia Assessment:                       - The risks and benefits of the procedure and the                        sedation options and risks were discussed with the                        patient. All questions were answered and informed                        consent was obtained.                       - Patient identification and proposed procedure were                        verified prior to the procedure by the nurse. The                        procedure was verified in the procedure room.                       - ASA Grade Assessment: II - A patient with mild                        systemic disease.                       - After reviewing the risks and benefits, the patient                        was deemed in satisfactory condition to undergo the                        procedure.                       After obtaining informed consent, the colonoscope was                        passed under direct vision. Throughout the procedure,                        the patient's blood pressure, pulse, and oxygen  saturations were monitored continuously. The                        Colonoscope was introduced through the anus and                        advanced to the the cecum, identified by appendiceal                        orifice and ileocecal valve. The colonoscopy was                     performed without difficulty. The patient tolerated the                        procedure well. The quality of the bowel preparation                        was adequate. The ileocecal valve, appendiceal orifice,                        and rectum were photographed. Findings:      The perianal and digital rectal examinations were normal. Pertinent       negatives include normal sphincter tone and no palpable rectal lesions.      Non-bleeding internal hemorrhoids were found during retroflexion. The       hemorrhoids were Grade I (internal hemorrhoids that do not prolapse).      The exam was otherwise without abnormality. Impression:           - Non-bleeding internal hemorrhoids.                       - The examination was otherwise normal.                       - No specimens collected. Recommendation:       - Patient has a contact number available for                        emergencies. The signs and symptoms of potential                        delayed complications were discussed with the patient.                        Return to normal activities tomorrow. Written discharge                        instructions were provided to the patient.                       - Resume previous diet.                       - Continue present medications.                       - Repeat colonoscopy in 10 years for screening purposes.                       - The findings and recommendations were discussed with  the patient and their family. Procedure Code(s):    --- Professional ---                       E0712, Colorectal cancer screening; colonoscopy on                        individual not meeting criteria for high risk Diagnosis Code(s):    --- Professional ---                       K64.0, First degree hemorrhoids                       Z12.11, Encounter for screening for malignant neoplasm                        of colon CPT copyright 2017 American Medical  Association. All rights reserved. The codes documented in this report are preliminary and upon coder review may  be revised to meet current compliance requirements. Efrain Sella MD, MD 02/18/2018 8:53:54 AM This report has been signed electronically. Number of Addenda: 0 Note Initiated On: 02/18/2018 8:28 AM Scope Withdrawal Time: 0 hours 6 minutes 37 seconds  Total Procedure Duration: 0 hours 15 minutes 8 seconds       [facility]

## 2018-02-18 NOTE — Anesthesia Preprocedure Evaluation (Signed)
Anesthesia Evaluation  Patient identified by MRN, date of birth, ID band Patient awake    Reviewed: Allergy & Precautions, H&P , NPO status , reviewed documented beta blocker date and time   Airway Mallampati: II  TM Distance: >3 FB Neck ROM: full    Dental  (+) Teeth Intact   Pulmonary former smoker,    Pulmonary exam normal        Cardiovascular hypertension, + CAD, + Past MI and + Cardiac Stents  Normal cardiovascular exam  Stent 2010   Neuro/Psych    GI/Hepatic GERD  Controlled,  Endo/Other  diabetes  Renal/GU      Musculoskeletal  (+) Arthritis ,   Abdominal   Peds  Hematology  (+) anemia ,   Anesthesia Other Findings foot surgery  Left due to trauma CORONARY ANGIOPLASTY 2010   Cardiac cath    Left foot surgery   due to trauma right leg skin cancer resected 06/2016  basal cell carcinoma  Medical History  Hypertension   Hyperlipidemia   Plantar fascial fibromatosis   Myocardial infarction (CMS-[name])   Pneumonia   CAD (coronary artery disease)   Diabetes mellitus type I (CMS-[name])   Basal cell carcinoma 2017 right shin, s/p excision Bladder cancer (CMS-[name])  s/p excision bladder wall tumors 2018   Reproductive/Obstetrics                             Anesthesia Physical Anesthesia Plan  ASA: III  Anesthesia Plan: General   Post-op Pain Management:    Induction:   PONV Risk Score and Plan: 2 and Propofol infusion  Airway Management Planned:   Additional Equipment:   Intra-op Plan:   Post-operative Plan:   Informed Consent: I have reviewed the patients History and Physical, chart, labs and discussed the procedure including the risks, benefits and alternatives for the proposed anesthesia with the patient or authorized representative who has indicated his/her understanding and acceptance.   Dental Advisory Given  Plan Discussed with:  CRNA  Anesthesia Plan Comments:         Anesthesia Quick Evaluation

## 2018-02-18 NOTE — H&P (Signed)
Outpatient short stay form Pre-procedure 02/18/2018 8:19 AM [name], M.D.  Primary Physician: [name], MD  Reason for visit: Colon cancer screening.  History of present illness: Patient is a 72 year old male presenting for colorectal cancer screening, average risk.  Patient has history of coronary disease with intracoronary stents placed.  He takes Plavix but this is been held for the past 5 days.  He has a history of bladder cancer and is just completing chemotherapy for this.  He denies any abdominal pain, rectal bleeding, unexplained weight loss or anorexia.    Current Facility-Administered Medications:  .  0.9 %  sodium chloride infusion, , Intravenous, Continuous, [place], Benay Pike, MD, Last Rate: 20 mL/hr at 02/18/18 0756, 1,000 mL at 02/18/18 0756  Medications Prior to Admission  Medication Sig Dispense Refill Last Dose  . aspirin EC 81 MG tablet Take 81 mg by mouth daily.   02/10/2018  . atorvastatin (LIPITOR) 80 MG tablet take 1 tablet by mouth at bedtime (Patient taking differently: take 80 mg by mouth at bedtime) 90 tablet 3 10/20/2017 at Unknown time  . carvedilol (COREG) 12.5 MG tablet take 12.5 mg by mouth twice a day with food 180 tablet 3   . clopidogrel (PLAVIX) 75 MG tablet Take 75 mg by mouth daily.   02/04/2018  . docusate sodium (COLACE) 100 MG capsule Take 2 capsules (200 mg total) by mouth 2 (two) times daily. (Patient not taking: Reported on 10/07/2017) 120 capsule 3 Not Taking at Unknown time  . furosemide (LASIX) 20 MG tablet take 1 tablet by mouth once daily (Patient taking differently: take 20 mg by mouth once daily) 90 tablet 3 10/20/2017 at Unknown time  . glucagon (GLUCAGON EMERGENCY) 1 MG injection Inject 1 mg into the skin once as needed (for low blood sugar).    10/20/2017 at Unknown time  . Hyoscyamine Sulfate SL (LEVSIN/SL) 0.125 MG SUBL Place 0.125 mg under the tongue every 4 (four) hours as needed (bladder spasms). 1-2 TABS (Patient not  taking: Reported on 10/07/2017) 40 each 1 Completed Course at Unknown time  . ibuprofen (ADVIL,MOTRIN) 800 MG tablet Take 1 tablet (800 mg total) by mouth every 8 (eight) hours as needed. (Patient not taking: Reported on 07/04/2017) 30 tablet 0 Completed Course at Unknown time  . insulin glargine (LANTUS) 100 UNIT/ML injection Inject 5-20 Units into the skin See admin instructions. Inject 20 units SQ every morning and inject 5 units SQ at bedtime depending on blood sugar level   10/20/2017 at Unknown time  . losartan (COZAAR) 50 MG tablet Take 25 mg by mouth every morning.    10/20/2017 at Unknown time  . Multiple Vitamin (MULTIVITAMIN WITH MINERALS) TABS tablet Take 1 tablet by mouth daily. Alive   10/20/2017 at Unknown time  . naproxen sodium (ANAPROX) 220 MG tablet Take 220 mg by mouth 2 (two) times daily as needed (for pain or headache).    Past Month at Unknown time  . nitroGLYCERIN (NITROSTAT) 0.4 MG SL tablet Place 0.4 mg under the tongue every 5 (five) minutes as needed for chest pain. Do not exceed 3 doses in 15 minutes.    Not Taking at Unknown time  . NOVOLOG 100 UNIT/ML injection Inject 0-10 units SQ 3 times daily with meals per sliding scale  0 10/20/2017 at Unknown time  . vitamin B-12 (CYANOCOBALAMIN) 500 MCG tablet Take 500 mcg by mouth daily.   10/20/2017 at Unknown time     No Known Allergies  Past Medical History:  Diagnosis Date  . Anemia   . Arthritis    right knee  . Cancer [facility])    BLADDER CANCER (2018)skin cancer  . Coronary artery disease   . Diabetes mellitus   . Edema   . GERD (gastroesophageal reflux disease)    rare-NO MEDS  . Heart attack ([place]) 2007  . Hyperlipidemia   . Hypertension   . Hypoglycemia    history of episodes x2  . Ischemic cardiomyopathy   . Myocardial infarction [facility])     Review of systems:   Otherwise negative.   Physical Exam  Gen: Alert, oriented. Appears stated age.  HEENT: [place]/AT. PERRLA. Lungs: CTA, no wheezes. CV: RR nl  S1, S2. Abd: soft, benign, no masses. BS+ Ext: No edema. Pulses 2+    Planned procedures: Colonoscopy.The patient understands the nature of the planned procedure, indications, risks, alternatives and potential complications including but not limited to bleeding, infection, perforation, damage to internal organs and possible oversedation/side effects from anesthesia. The patient agrees and gives consent to proceed.  Please refer to procedure notes for findings, recommendations and patient disposition/instructions.    [name] K. Alice [name], M.D. Gastroenterology 02/18/2018  8:19 AM

## 2018-02-18 NOTE — Interval H&P Note (Signed)
History and Physical Interval Note:  02/18/2018 8:20 AM  [name]  has presented today for surgery, with the diagnosis of screening  The various methods of treatment have been discussed with the patient and family. After consideration of risks, benefits and other options for treatment, the patient has consented to  Procedure(s): COLONOSCOPY WITH PROPOFOL (N/A) as a surgical intervention .  The patient's history has been reviewed, patient examined, no change in status, stable for surgery.  I have reviewed the patient's chart and labs.  Questions were answered to the patient's satisfaction.     [place], [place]

## 2018-02-18 NOTE — Transfer of Care (Signed)
Immediate Anesthesia Transfer of Care Note  Patient: [name]  Procedure(s) Performed: COLONOSCOPY WITH PROPOFOL (N/A )  Patient Location: Endoscopy Unit  Anesthesia Type:General  Level of Consciousness: drowsy and patient cooperative  Airway & Oxygen Therapy: Patient Spontanous Breathing and Patient connected to nasal cannula oxygen  Post-op Assessment: Report given to RN, Post -op Vital signs reviewed and stable and Patient moving all extremities X 4  Post vital signs: Reviewed and stable  Last Vitals:  Vitals Value Taken Time  BP    Temp    Pulse 71 02/18/2018  8:57 AM  Resp 12 02/18/2018  8:57 AM  SpO2 100 % 02/18/2018  8:57 AM  Vitals shown include unvalidated device data.  Last Pain:  Vitals:   02/18/18 0734  TempSrc: Tympanic         Complications: No apparent anesthesia complications

## 2018-02-18 NOTE — Anesthesia Post-op Follow-up Note (Signed)
Anesthesia QCDR form completed.        

## 2018-02-19 ENCOUNTER — Encounter: Payer: Self-pay | Admitting: Internal Medicine

## 2018-02-27 NOTE — Progress Notes (Deleted)
Patient ID: [name], male   DOB: 06/20/1946, 72 y.o.   MRN: [id number] Cardiology Office Note  Date:  02/27/2018   ID:  [name], DOB 06/14/1946, MRN [id number]  PCP:  [name], MD   No chief complaint on file.   HPI:  Mr. Sickinger is a very pleasant 72 year old gentleman with a history of coronary artery disease,  occluded LAD with stent placed February 2008 ( [place]her 3.0 x 8 mm DES stent),  poorly controlled diabetes, hypertension  who presents for routine followup of his coronary artery disease  In follow-up today he reports that he feels well with no complaints Active on a horse farm Denies having any significant bruising or bleeding on Plavix with aspirin  Lab work reviewed with him total cholesterol 140, LDL 75, hemoglobin A1c 7.9 Followed by endocrinology  EKG on today's visit showing normal sinus rhythm rate 69 bpm old anterior MI, T-wave abnormality No change from previous EKGs  Other past medical history He is active on a horse farm.  His previous angina was described as posterior neck pain.  On previous visits, he reported having night sweats. We held his carvedilol and Lipitor. Night sweats did not improve. told he had infection in his gums and has had significant work done. night sweats have improved    Cardiac catheter report from 2008 details 25% ostial left main, 100% mid LAD with stent placed at this location,40% proximal circumflex, 20% PL vessel disease Previous stress test showing scar in the mid to distal anterior wall. Previous lab work; Hemoglobin A1c 8.2, total cholesterol 134, LDL 65  PMH:   has a past medical history of Anemia, Arthritis, Cancer ([place]), Coronary artery disease, Diabetes mellitus, Edema, GERD (gastroesophageal reflux disease), Heart attack ([place]) (2007), Hyperlipidemia, Hypertension, Hypoglycemia, Ischemic cardiomyopathy, and Myocardial infarction ([place]).  PSH:    Past Surgical History:  Procedure  Laterality Date  . CARDIAC CATHETERIZATION    . COLONOSCOPY WITH PROPOFOL N/A 02/18/2018   Procedure: COLONOSCOPY WITH PROPOFOL;  Surgeon: [place], Benay Pike, MD;  Location: [facility] ENDOSCOPY;  Service: Gastroenterology;  Laterality: N/A;  . CORONARY ANGIOPLASTY    . CORONARY STENT PLACEMENT     To an occluded LAD x2  . CYSTOSCOPY WITH BIOPSY N/A 10/21/2017   Procedure: CYSTOSCOPY WITH BLADDER BIOPSY;  Surgeon: Royston Cowper, MD;  Location: [facility] ORS;  Service: Urology;  Laterality: N/A;  . EXCISION MASS LOWER EXTREMETIES Right 06/05/2016   Procedure: EXCISION OF LESION RIGHT LOWER LEG;  Surgeon: Hubbard Robinson, MD;  Location: [facility] ORS;  Service: General;  Laterality: Right;  . SKIN CANCER EXCISION     chest  . TRANSURETHRAL RESECTION OF BLADDER TUMOR N/A 07/15/2017   Procedure: TRANSURETHRAL RESECTION OF BLADDER TUMOR (TURBT);  Surgeon: Royston Cowper, MD;  Location: [facility] ORS;  Service: Urology;  Laterality: N/A;    Current Outpatient Medications  Medication Sig Dispense Refill  . aspirin EC 81 MG tablet Take 81 mg by mouth daily.    Marland Kitchen atorvastatin (LIPITOR) 80 MG tablet take 1 tablet by mouth at bedtime (Patient taking differently: take 80 mg by mouth at bedtime) 90 tablet 3  . carvedilol (COREG) 12.5 MG tablet take 12.5 mg by mouth twice a day with food 180 tablet 3  . clopidogrel (PLAVIX) 75 MG tablet Take 75 mg by mouth daily.    Marland Kitchen docusate sodium (COLACE) 100 MG capsule Take 2 capsules (200 mg total) by mouth 2 (two) times daily. (Patient not taking: Reported  on 10/07/2017) 120 capsule 3  . furosemide (LASIX) 20 MG tablet take 1 tablet by mouth once daily (Patient taking differently: take 20 mg by mouth once daily) 90 tablet 3  . glucagon (GLUCAGON EMERGENCY) 1 MG injection Inject 1 mg into the skin once as needed (for low blood sugar).     . Hyoscyamine Sulfate SL (LEVSIN/SL) 0.125 MG SUBL Place 0.125 mg under the tongue every 4 (four) hours as needed (bladder spasms). 1-2 TABS  (Patient not taking: Reported on 10/07/2017) 40 each 1  . ibuprofen (ADVIL,MOTRIN) 800 MG tablet Take 1 tablet (800 mg total) by mouth every 8 (eight) hours as needed. (Patient not taking: Reported on 07/04/2017) 30 tablet 0  . insulin glargine (LANTUS) 100 UNIT/ML injection Inject 5-20 Units into the skin See admin instructions. Inject 20 units SQ every morning and inject 5 units SQ at bedtime depending on blood sugar level    . losartan (COZAAR) 50 MG tablet Take 25 mg by mouth every morning.     . Multiple Vitamin (MULTIVITAMIN WITH MINERALS) TABS tablet Take 1 tablet by mouth daily. Alive    . naproxen sodium (ANAPROX) 220 MG tablet Take 220 mg by mouth 2 (two) times daily as needed (for pain or headache).     . nitroGLYCERIN (NITROSTAT) 0.4 MG SL tablet Place 0.4 mg under the tongue every 5 (five) minutes as needed for chest pain. Do not exceed 3 doses in 15 minutes.     Marland Kitchen NOVOLOG 100 UNIT/ML injection Inject 0-10 units SQ 3 times daily with meals per sliding scale  0  . vitamin B-12 (CYANOCOBALAMIN) 500 MCG tablet Take 500 mcg by mouth daily.     No current facility-administered medications for this visit.      Allergies:   Patient has no known allergies.   Social History:  The patient  reports that he quit smoking about 22 years ago. His smoking use included cigarettes. He has a 10.00 pack-year smoking history. He has never used smokeless tobacco. He reports that he does not drink alcohol or use drugs.   Family History:   family history includes Heart attack in his mother; Heart disease in his father and mother; Prostate cancer in his brother.    Review of Systems: Review of Systems  Constitutional: Negative.   Respiratory: Negative.   Cardiovascular: Negative.   Gastrointestinal: Negative.   Musculoskeletal: Negative.   Neurological: Negative.   Psychiatric/Behavioral: Negative.   All other systems reviewed and are negative.    PHYSICAL EXAM: VS:  There were no vitals taken  for this visit. , BMI There is no height or weight on file to calculate BMI. GEN: Well nourished, well developed, in no acute distress  HEENT: normal  Neck: no JVD, carotid bruits, or masses Cardiac: RRR; no murmurs, rubs, or gallops,no edema  Respiratory:  clear to auscultation bilaterally, normal work of breathing GI: soft, nontender, nondistended, + BS MS: no deformity or atrophy  Skin: warm and dry, no rash Neuro:  Strength and sensation are intact Psych: euthymic mood, full affect    Recent Labs: 07/10/2017: Hemoglobin 11.7; Platelets 222 10/16/2017: BUN 21; Potassium 4.3; Sodium 138 12/31/2017: Creatinine, Ser 1.00    Lipid Panel Lab Results  Component Value Date   CHOL 146 01/03/2011   HDL 53 01/03/2011   LDLCALC 79 01/03/2011   TRIG 70 01/03/2011      Wt Readings from Last 3 Encounters:  02/18/18 184 lb (83.5 kg)  10/21/17 198 lb (89.8  kg)  07/15/17 189 lb (85.7 kg)       ASSESSMENT AND PLAN:  Atherosclerosis of native coronary artery of native heart without angina pectoris - Plan: EKG 12-Lead Currently with no symptoms of angina. No further workup at this time. Continue current medication regimen.  HYPERTENSION, BENIGN - Plan: EKG 12-Lead Blood pressure is well controlled on today's visit. No changes made to the medications. He takes losartan 25 mg daily Occasionally holds the medications for low blood pressure  Hyperlipidemia LDL slightly above goal, we did offer Zetia He is comfortable where his numbers are  Controlled type 2 diabetes mellitus with other circulatory complication, without long-term current use of insulin ([name])  hemoglobin A1c 7.9 He has had diabetes for 50 years Followed by endocrinology   Total encounter time more than 15 minutes  Greater than 50% was spent in counseling and coordination of care with the patient   Disposition:   F/U  12 months   No orders of the defined types were placed in this encounter.   Total encounter  time more than 15 minutes  Greater than 50% was spent in counseling and coordination of care with the patient   Signed, Esmond Plants, M.D., Ph.D. 02/27/2018  [place], [place]

## 2018-03-02 ENCOUNTER — Ambulatory Visit: Payer: Self-pay | Admitting: Cardiovascular Disease

## 2018-03-03 ENCOUNTER — Other Ambulatory Visit: Payer: Self-pay | Admitting: Family Medicine

## 2018-03-03 DIAGNOSIS — R9389 Abnormal findings on diagnostic imaging of other specified body structures: Secondary | ICD-10-CM

## 2018-03-03 DIAGNOSIS — R911 Solitary pulmonary nodule: Secondary | ICD-10-CM

## 2018-03-04 DIAGNOSIS — N401 Enlarged prostate with lower urinary tract symptoms: Secondary | ICD-10-CM | POA: Diagnosis not present

## 2018-03-04 DIAGNOSIS — Z79899 Other long term (current) drug therapy: Secondary | ICD-10-CM | POA: Diagnosis not present

## 2018-03-04 DIAGNOSIS — Z8551 Personal history of malignant neoplasm of bladder: Secondary | ICD-10-CM | POA: Diagnosis not present

## 2018-03-07 NOTE — Progress Notes (Signed)
Patient ID: [name], male   DOB: 03-Jul-1946, 72 y.o.   MRN: [id number] Cardiology Office Note  Date:  03/09/2018   ID:  [name], DOB [birth year], MRN [id number]  PCP:  [name], MD   Chief Complaint  Patient presents with  . other    12 month follow up. Meds reviewed by the pt. verbally.  "doing well."     HPI:  [name] is a very pleasant 72 year old gentleman with a history of coronary artery disease,  occluded LAD with stent placed February 2008 ( Cypher 3.0 x 8 mm DES stent),  poorly controlled diabetes, hypertension  Former smoker, quit late 24s bladder cancer,08/2017 finishing chemo (for one year) who presents for routine followup of his coronary artery disease  Going through chemo for bladder cancer, Had hematuria Treatments well in October 2019  Active on the horse farm, lots of mowing Repair work He is on aspirin, Plavix was held during bladder treatments Deciding whether to go back or not on the pill  Lab work reviewed  hemoglobin A1c 8.3 Followed by endocrinology  Total chol 143 in 01/2017  No recent lipid panel  EKG personally reviewed by myself on todays visit Shows normal sinus rhythm rate 72 bpm consider old anteroseptal MI  Other past medical history He is active on a horse farm.  His previous angina was described as posterior neck pain.  On previous visits, he reported having night sweats. We held his carvedilol and Lipitor. Night sweats did not improve. told he had infection in his gums and has had significant work done. night sweats have improved    Cardiac catheter report from 2008 details 25% ostial left main, 100% mid LAD with stent placed at this location,40% proximal circumflex, 20% PL vessel disease Previous stress test showing scar in the mid to distal anterior wall. Previous lab work; Hemoglobin A1c 8.2, total cholesterol 134, LDL 65  PMH:   has a past medical history of Anemia, Arthritis, Cancer  ([place]), Coronary artery disease, Diabetes mellitus, Edema, GERD (gastroesophageal reflux disease), Heart attack ([place]) (2007), Hyperlipidemia, Hypertension, Hypoglycemia, Ischemic cardiomyopathy, and Myocardial infarction ([place]).  PSH:    Past Surgical History:  Procedure Laterality Date  . CARDIAC CATHETERIZATION    . COLONOSCOPY WITH PROPOFOL N/A 02/18/2018   Procedure: COLONOSCOPY WITH PROPOFOL;  Surgeon: Toledo, Benay Pike, MD;  Location: [facility] ENDOSCOPY;  Service: Gastroenterology;  Laterality: N/A;  . CORONARY ANGIOPLASTY    . CORONARY STENT PLACEMENT     To an occluded LAD x2  . CYSTOSCOPY WITH BIOPSY N/A 10/21/2017   Procedure: CYSTOSCOPY WITH BLADDER BIOPSY;  Surgeon: Royston Cowper, MD;  Location: [facility] ORS;  Service: Urology;  Laterality: N/A;  . EXCISION MASS LOWER EXTREMETIES Right 06/05/2016   Procedure: EXCISION OF LESION RIGHT LOWER LEG;  Surgeon: Hubbard Robinson, MD;  Location: [facility] ORS;  Service: General;  Laterality: Right;  . SKIN CANCER EXCISION     chest  . TRANSURETHRAL RESECTION OF BLADDER TUMOR N/A 07/15/2017   Procedure: TRANSURETHRAL RESECTION OF BLADDER TUMOR (TURBT);  Surgeon: Royston Cowper, MD;  Location: [facility] ORS;  Service: Urology;  Laterality: N/A;    Current Outpatient Medications  Medication Sig Dispense Refill  . aspirin EC 81 MG tablet Take 81 mg by mouth daily.    Marland Kitchen atorvastatin (LIPITOR) 80 MG tablet take 1 tablet by mouth at bedtime (Patient taking differently: take 80 mg by mouth at bedtime) 90 tablet 3  . carvedilol (COREG) 12.5 MG  tablet take 12.5 mg by mouth twice a day with food 180 tablet 3  . clopidogrel (PLAVIX) 75 MG tablet Take 75 mg by mouth daily.    Marland Kitchen docusate sodium (COLACE) 100 MG capsule Take 2 capsules (200 mg total) by mouth 2 (two) times daily. 120 capsule 3  . furosemide (LASIX) 20 MG tablet take 1 tablet by mouth once daily (Patient taking differently: take 20 mg by mouth once daily) 90 tablet 3  . glucagon (GLUCAGON EMERGENCY) 1  MG injection Inject 1 mg into the skin once as needed (for low blood sugar).     . Hyoscyamine Sulfate SL (LEVSIN/SL) 0.125 MG SUBL Place 0.125 mg under the tongue every 4 (four) hours as needed (bladder spasms). 1-2 TABS 40 each 1  . ibuprofen (ADVIL,MOTRIN) 800 MG tablet Take 1 tablet (800 mg total) by mouth every 8 (eight) hours as needed. 30 tablet 0  . insulin glargine (LANTUS) 100 UNIT/ML injection Inject 5-20 Units into the skin See admin instructions. Inject 20 units SQ every morning and inject 5 units SQ at bedtime depending on blood sugar level    . losartan (COZAAR) 50 MG tablet Take 25 mg by mouth every morning.     . Multiple Vitamin (MULTIVITAMIN WITH MINERALS) TABS tablet Take 1 tablet by mouth daily. Alive    . naproxen sodium (ANAPROX) 220 MG tablet Take 220 mg by mouth 2 (two) times daily as needed (for pain or headache).     . nitroGLYCERIN (NITROSTAT) 0.4 MG SL tablet Place 0.4 mg under the tongue every 5 (five) minutes as needed for chest pain. Do not exceed 3 doses in 15 minutes.     Marland Kitchen NOVOLOG 100 UNIT/ML injection Inject 0-10 units SQ 3 times daily with meals per sliding scale  0  . vitamin B-12 (CYANOCOBALAMIN) 500 MCG tablet Take 500 mcg by mouth daily.    Marland Kitchen ezetimibe (ZETIA) 10 MG tablet Take 1 tablet (10 mg total) by mouth daily. 90 tablet 3   No current facility-administered medications for this visit.      Allergies:   Patient has no known allergies.   Social History:  The patient  reports that he quit smoking about 22 years ago. His smoking use included cigarettes. He has a 10.00 pack-year smoking history. He has never used smokeless tobacco. He reports that he does not drink alcohol or use drugs.   Family History:   family history includes Heart attack in his mother; Heart disease in his father and mother; Prostate cancer in his brother.    Review of Systems: Review of Systems  Constitutional: Negative.   Respiratory: Negative.   Cardiovascular: Negative.    Gastrointestinal: Negative.   Musculoskeletal: Negative.   Neurological: Negative.   Psychiatric/Behavioral: Negative.   All other systems reviewed and are negative.    PHYSICAL EXAM: VS:  BP 130/62 (BP Location: Left Arm, Patient Position: Sitting, Cuff Size: Normal)   Pulse 72   Ht 5\' 9"  (1.753 m)   Wt 193 lb (87.5 kg)   BMI 28.50 kg/m  , BMI Body mass index is 28.5 kg/m. Constitutional:  oriented to person, place, and time. No distress.  HENT:  Head: Normocephalic and atraumatic.  Eyes:  no discharge. No scleral icterus.  Neck: Normal range of motion. Neck supple. No JVD present.  Cardiovascular: Normal rate, regular rhythm, normal heart sounds and intact distal pulses. Exam reveals no gallop and no friction rub. No edema No murmur heard. Pulmonary/Chest: Effort normal and  breath sounds normal. No stridor. No respiratory distress.  no wheezes.  no rales.  no tenderness.  Abdominal: Soft.  no distension.  no tenderness.  Musculoskeletal: Normal range of motion.  no  tenderness or deformity.  Neurological:  normal muscle tone. Coordination normal. No atrophy Skin: Skin is warm and dry. No rash noted. not diaphoretic.  Psychiatric:  normal mood and affect. behavior is normal. Thought content normal.   Recent Labs: 07/10/2017: Hemoglobin 11.7; Platelets 222 10/16/2017: BUN 21; Potassium 4.3; Sodium 138 12/31/2017: Creatinine, Ser 1.00    Lipid Panel Lab Results  Component Value Date   CHOL 146 01/03/2011   HDL 53 01/03/2011   LDLCALC 79 01/03/2011   TRIG 70 01/03/2011      Wt Readings from Last 3 Encounters:  03/09/18 193 lb (87.5 kg)  02/18/18 184 lb (83.5 kg)  10/21/17 198 lb (89.8 kg)      ASSESSMENT AND PLAN:  Atherosclerosis of native coronary artery of native heart without angina pectoris - Plan: EKG 12-Lead Active, no new complaints concerning for angina No new testing ordered CT scan chest pulled up no significant aortic atherosclerosis He does have  coronary calcifications, stent x2 seen in the LAD Also with some very mild circumflex and RCA calcification  HYPERTENSION, BENIGN - Plan: EKG 12-Lead He is taking carvedilol 12.5 twice daily but stopped the losartan Discussed renal benefits of losartan suggested he retry 25 mg daily If blood pressure runs low we could cut back on the carvedilol  Hyperlipidemia  Long discussion with him we would like LDL lower We will start Zetia 10 mg daily  Controlled type 2 diabetes mellitus with other circulatory complication, without long-term current use of insulin ([name])  hemoglobin A1c 8.3 He has had diabetes for 50 years Followed by endocrinology   Total encounter time more than 25 minutes  Greater than 50% was spent in counseling and coordination of care with the patient   Disposition:   F/U  12 months   Orders Placed This Encounter  Procedures  . EKG 12-Lead    Total encounter time more than 15 minutes  Greater than 50% was spent in counseling and coordination of care with the patient   Signed, Esmond Plants, M.D., Ph.D. 03/09/2018  [place], [place]

## 2018-03-09 ENCOUNTER — Encounter: Payer: Self-pay | Admitting: Cardiovascular Disease

## 2018-03-09 ENCOUNTER — Ambulatory Visit: Payer: Medicare HMO | Admitting: Cardiovascular Disease

## 2018-03-09 VITALS — BP 130/62 | HR 72 | Ht 69.0 in | Wt 193.0 lb

## 2018-03-09 DIAGNOSIS — I10 Essential (primary) hypertension: Secondary | ICD-10-CM | POA: Diagnosis not present

## 2018-03-09 DIAGNOSIS — I25118 Atherosclerotic heart disease of native coronary artery with other forms of angina pectoris: Secondary | ICD-10-CM

## 2018-03-09 DIAGNOSIS — E782 Mixed hyperlipidemia: Secondary | ICD-10-CM

## 2018-03-09 DIAGNOSIS — R600 Localized edema: Secondary | ICD-10-CM | POA: Diagnosis not present

## 2018-03-09 DIAGNOSIS — E1159 Type 2 diabetes mellitus with other circulatory complications: Secondary | ICD-10-CM

## 2018-03-09 DIAGNOSIS — E1059 Type 1 diabetes mellitus with other circulatory complications: Secondary | ICD-10-CM | POA: Diagnosis not present

## 2018-03-09 MED ORDER — EZETIMIBE 10 MG PO TABS: 10.0000 mg | ORAL_TABLET | Freq: Every day | ORAL | 3 refills | Status: DC

## 2018-03-09 NOTE — Patient Instructions (Addendum)
Medication Instructions:   Please start zetia one a day for cholesterol  Ask about flomax/tamsulosin  Labwork:  Lipids at the end of the summer, fasting  Testing/Procedures:  No further testing at this time   Follow-Up: It was a pleasure seeing you in the office today. Please call us if you have new issues that need to be addressed before your next appt.  [phone]  Your physician wants you to follow-up in: 12 months.  You will receive a reminder letter in the mail two months in advance. If you don't receive a letter, please call our office to schedule the follow-up appointment.  If you need a refill on your cardiac medications before your next appointment, please call your pharmacy.  For educational health videos Log in to : [email or web address] Or : [email or web address], password : [email or web address]

## 2018-03-11 DIAGNOSIS — Z79899 Other long term (current) drug therapy: Secondary | ICD-10-CM | POA: Diagnosis not present

## 2018-03-11 DIAGNOSIS — Z8551 Personal history of malignant neoplasm of bladder: Secondary | ICD-10-CM | POA: Diagnosis not present

## 2018-03-16 DIAGNOSIS — E782 Mixed hyperlipidemia: Secondary | ICD-10-CM | POA: Diagnosis not present

## 2018-03-16 DIAGNOSIS — R809 Proteinuria, unspecified: Secondary | ICD-10-CM | POA: Diagnosis not present

## 2018-03-16 DIAGNOSIS — E1029 Type 1 diabetes mellitus with other diabetic kidney complication: Secondary | ICD-10-CM | POA: Diagnosis not present

## 2018-03-16 DIAGNOSIS — E1059 Type 1 diabetes mellitus with other circulatory complications: Secondary | ICD-10-CM | POA: Diagnosis not present

## 2018-03-16 DIAGNOSIS — E1042 Type 1 diabetes mellitus with diabetic polyneuropathy: Secondary | ICD-10-CM | POA: Diagnosis not present

## 2018-03-18 DIAGNOSIS — Z1630 Resistance to unspecified antimicrobial drugs: Secondary | ICD-10-CM | POA: Diagnosis not present

## 2018-03-18 DIAGNOSIS — N390 Urinary tract infection, site not specified: Secondary | ICD-10-CM | POA: Diagnosis not present

## 2018-03-18 DIAGNOSIS — R350 Frequency of micturition: Secondary | ICD-10-CM | POA: Diagnosis not present

## 2018-03-18 DIAGNOSIS — R300 Dysuria: Secondary | ICD-10-CM | POA: Diagnosis not present

## 2018-03-18 DIAGNOSIS — C674 Malignant neoplasm of posterior wall of bladder: Secondary | ICD-10-CM | POA: Diagnosis not present

## 2018-03-25 DIAGNOSIS — C674 Malignant neoplasm of posterior wall of bladder: Secondary | ICD-10-CM | POA: Diagnosis not present

## 2018-03-25 DIAGNOSIS — R350 Frequency of micturition: Secondary | ICD-10-CM | POA: Diagnosis not present

## 2018-03-25 DIAGNOSIS — R3129 Other microscopic hematuria: Secondary | ICD-10-CM | POA: Diagnosis not present

## 2018-04-01 DIAGNOSIS — Z8551 Personal history of malignant neoplasm of bladder: Secondary | ICD-10-CM | POA: Diagnosis not present

## 2018-04-01 DIAGNOSIS — R3129 Other microscopic hematuria: Secondary | ICD-10-CM | POA: Diagnosis not present

## 2018-04-01 DIAGNOSIS — Z79899 Other long term (current) drug therapy: Secondary | ICD-10-CM | POA: Diagnosis not present

## 2018-04-01 DIAGNOSIS — R300 Dysuria: Secondary | ICD-10-CM | POA: Diagnosis not present

## 2018-04-02 DIAGNOSIS — R3129 Other microscopic hematuria: Secondary | ICD-10-CM | POA: Diagnosis not present

## 2018-04-02 DIAGNOSIS — R300 Dysuria: Secondary | ICD-10-CM | POA: Diagnosis not present

## 2018-04-02 DIAGNOSIS — Z8551 Personal history of malignant neoplasm of bladder: Secondary | ICD-10-CM | POA: Diagnosis not present

## 2018-04-05 ENCOUNTER — Other Ambulatory Visit: Payer: Self-pay | Admitting: Cardiovascular Disease

## 2018-04-08 DIAGNOSIS — R310 Gross hematuria: Secondary | ICD-10-CM | POA: Diagnosis not present

## 2018-04-08 DIAGNOSIS — Z79899 Other long term (current) drug therapy: Secondary | ICD-10-CM | POA: Diagnosis not present

## 2018-04-08 DIAGNOSIS — R350 Frequency of micturition: Secondary | ICD-10-CM | POA: Diagnosis not present

## 2018-04-08 DIAGNOSIS — Z8551 Personal history of malignant neoplasm of bladder: Secondary | ICD-10-CM | POA: Diagnosis not present

## 2018-04-15 ENCOUNTER — Ambulatory Visit: Payer: Medicare HMO

## 2018-04-22 ENCOUNTER — Ambulatory Visit
Admission: RE | Admit: 2018-04-22 | Discharge: 2018-04-22 | Disposition: A | Discharge status: Home / Self Care | Payer: Medicare HMO | Source: Ambulatory Visit | Attending: Family Medicine | Admitting: Family Medicine

## 2018-04-22 DIAGNOSIS — R918 Other nonspecific abnormal finding of lung field: Secondary | ICD-10-CM | POA: Diagnosis not present

## 2018-04-22 DIAGNOSIS — J439 Emphysema, unspecified: Secondary | ICD-10-CM | POA: Insufficient documentation

## 2018-04-22 DIAGNOSIS — R9389 Abnormal findings on diagnostic imaging of other specified body structures: Secondary | ICD-10-CM

## 2018-04-22 DIAGNOSIS — I700 Atherosclerosis of aorta: Secondary | ICD-10-CM | POA: Diagnosis not present

## 2018-04-22 DIAGNOSIS — R911 Solitary pulmonary nodule: Secondary | ICD-10-CM

## 2018-05-06 DIAGNOSIS — R350 Frequency of micturition: Secondary | ICD-10-CM | POA: Diagnosis not present

## 2018-05-06 DIAGNOSIS — R3916 Straining to void: Secondary | ICD-10-CM | POA: Diagnosis not present

## 2018-05-06 DIAGNOSIS — R310 Gross hematuria: Secondary | ICD-10-CM | POA: Diagnosis not present

## 2018-05-06 DIAGNOSIS — Z79899 Other long term (current) drug therapy: Secondary | ICD-10-CM | POA: Diagnosis not present

## 2018-05-06 DIAGNOSIS — Z8551 Personal history of malignant neoplasm of bladder: Secondary | ICD-10-CM | POA: Diagnosis not present

## 2018-05-19 DIAGNOSIS — Z79899 Other long term (current) drug therapy: Secondary | ICD-10-CM | POA: Diagnosis not present

## 2018-05-19 DIAGNOSIS — N401 Enlarged prostate with lower urinary tract symptoms: Secondary | ICD-10-CM | POA: Diagnosis not present

## 2018-05-25 DIAGNOSIS — S6992XA Unspecified injury of left wrist, hand and finger(s), initial encounter: Secondary | ICD-10-CM | POA: Diagnosis not present

## 2018-05-26 DIAGNOSIS — E103312 Type 1 diabetes mellitus with moderate nonproliferative diabetic retinopathy with macular edema, left eye: Secondary | ICD-10-CM | POA: Diagnosis not present

## 2018-05-26 DIAGNOSIS — H2511 Age-related nuclear cataract, right eye: Secondary | ICD-10-CM | POA: Diagnosis not present

## 2018-05-27 DIAGNOSIS — Z8551 Personal history of malignant neoplasm of bladder: Secondary | ICD-10-CM | POA: Diagnosis not present

## 2018-05-27 DIAGNOSIS — Z79899 Other long term (current) drug therapy: Secondary | ICD-10-CM | POA: Diagnosis not present

## 2018-05-27 IMAGING — CT CT ABD-PEL WO/W CM
3 of 12 series · 11 of 46 positions shown, 17 images · IV contrast (iopamidol)
Comparison: 06/27/2017

CLINICAL DATA: Bladder cancer status post surgery and chemo. Now
with frequent urination and burning.

EXAM:
CT ABDOMEN AND PELVIS WITHOUT AND WITH CONTRAST
TECHNIQUE: Multidetector CT imaging of the abdomen and pelvis was performed
following the standard protocol before and following the bolus
administration of intravenous contrast.
CONTRAST:  125mL 2IR5UF-ARR IOPAMIDOL (2IR5UF-ARR) INJECTION 61%

[Series 2: without pre 5.00 br36 s3 · axial · non-contrast · 0.73mm/px · z∈[-1766,-1411]mm · 7 of 95 slices shown, 12 images]
[im 12/95  soft-tissue]
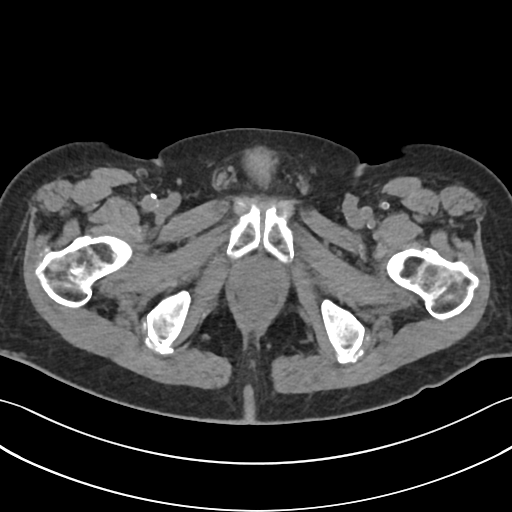
[im 12/95  bone]
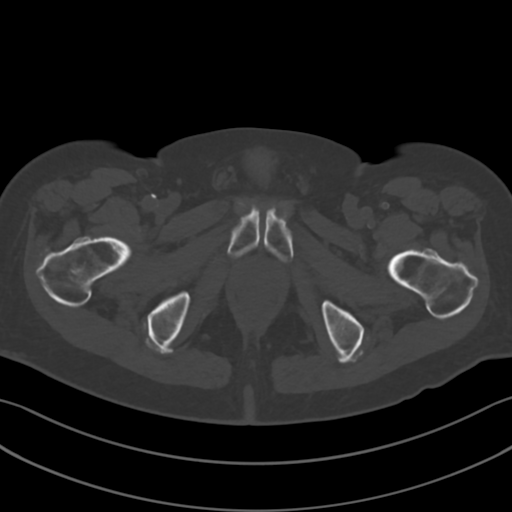
[im 24/95  soft-tissue]
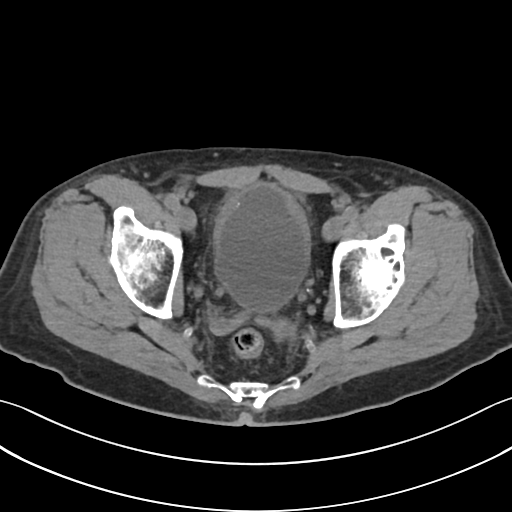
[im 36/95  soft-tissue]
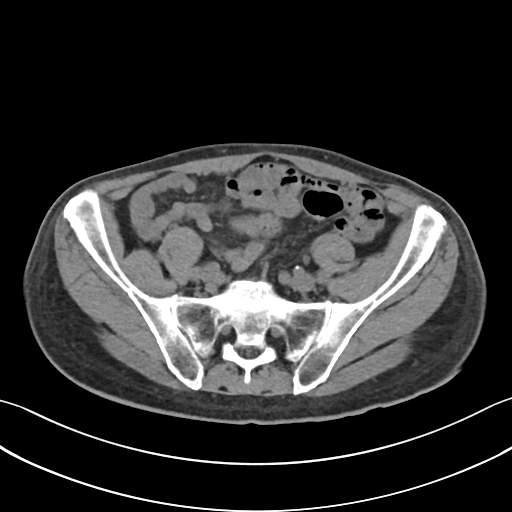
[im 48/95  soft-tissue]
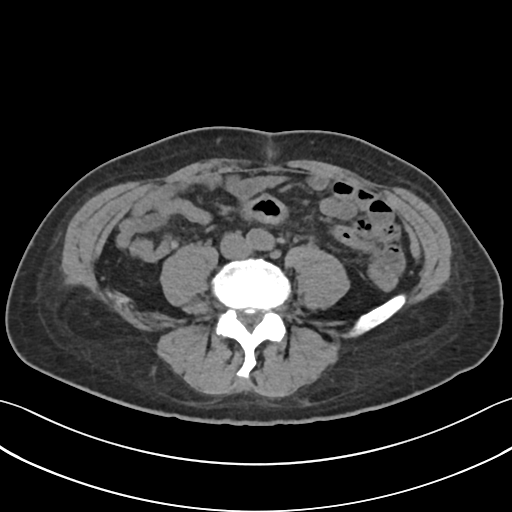
[im 48/95  lung]
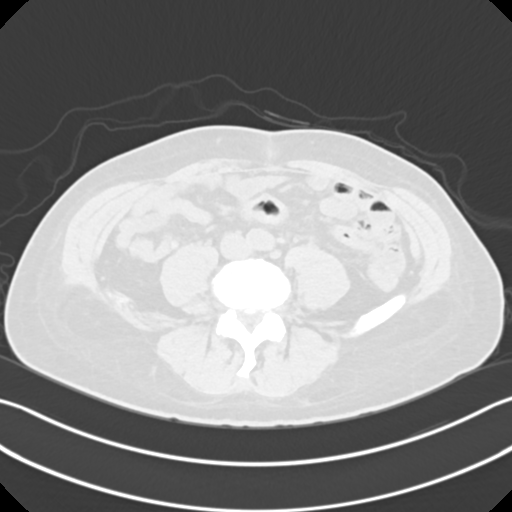
[im 59/95  soft-tissue]
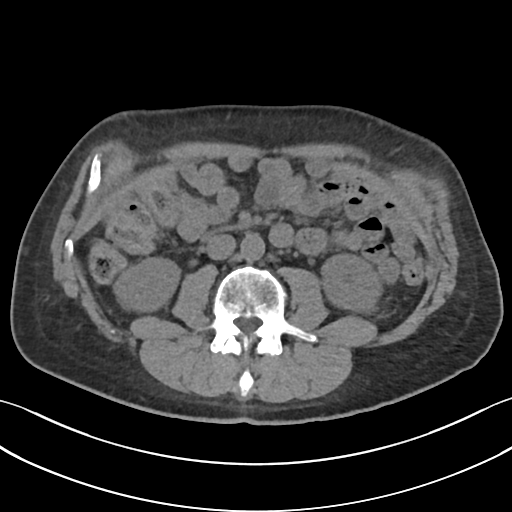
[im 59/95  lung]
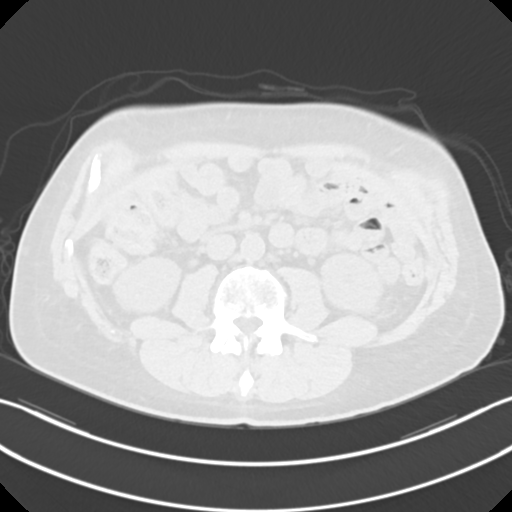
[im 71/95  soft-tissue]
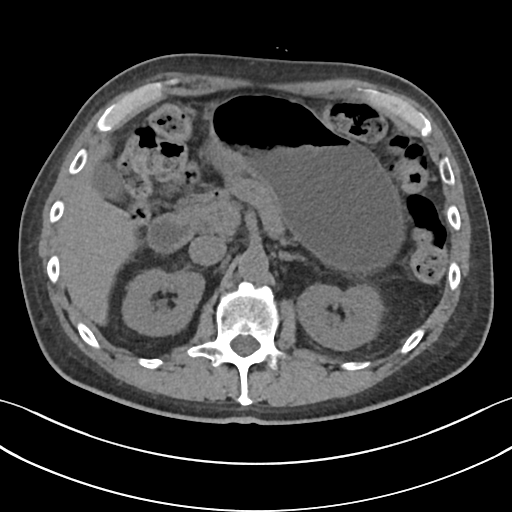
[im 71/95  lung]
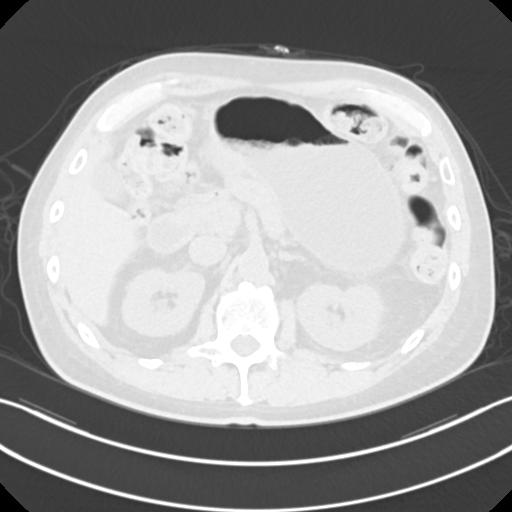
[im 83/95  soft-tissue]
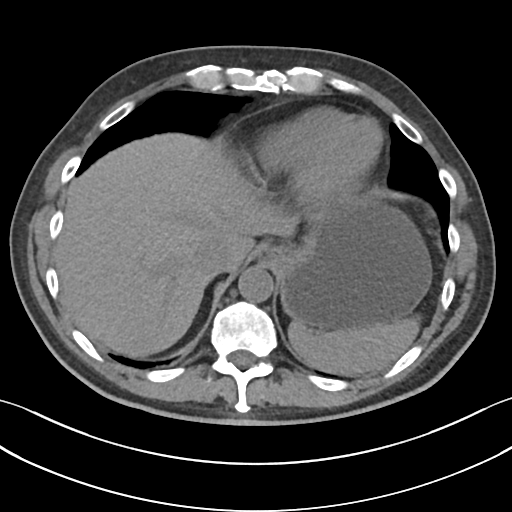
[im 83/95  lung]
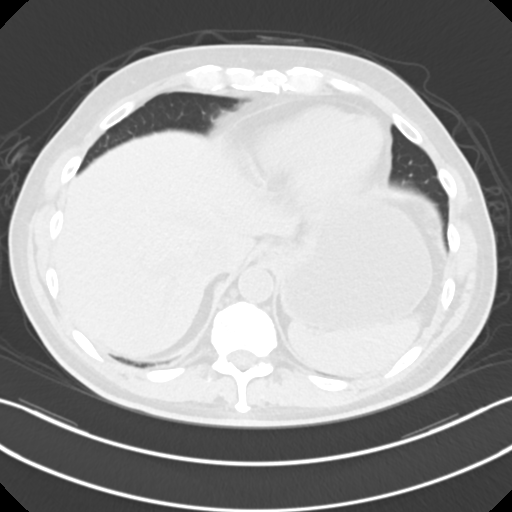

[Series 9: hematuria with 5.00 br36 s3 axial with · axial · 0.73mm/px · z∈[-1756,-1691]mm · 2 of 95 slices shown]
[im 14/95  soft-tissue]
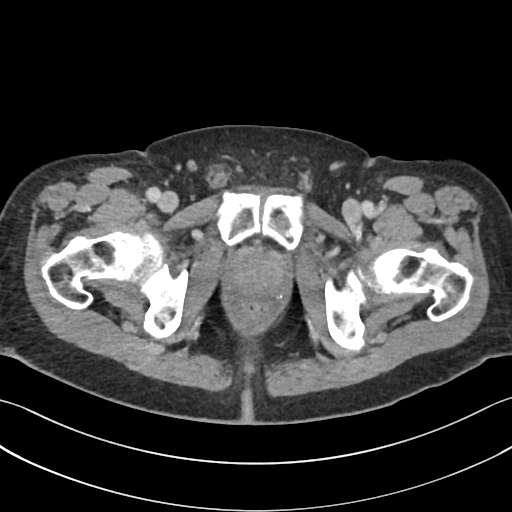
[im 27/95  soft-tissue]
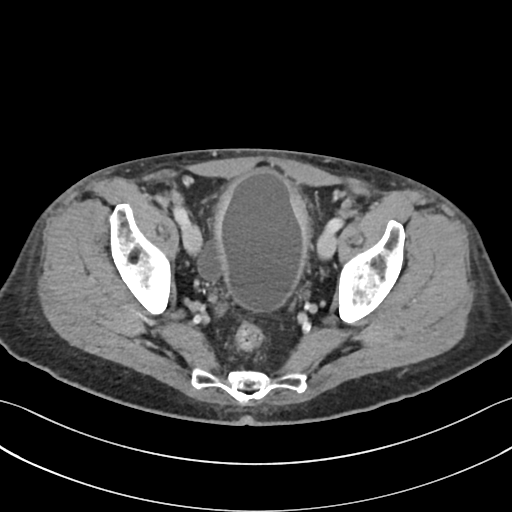

[Series 23: delay prone 2.00 br36 s3 cor cor delay · coronal · delayed · 0.73mm/px · 2 of 158 slices shown, 3 images]
[im 53/158  soft-tissue]
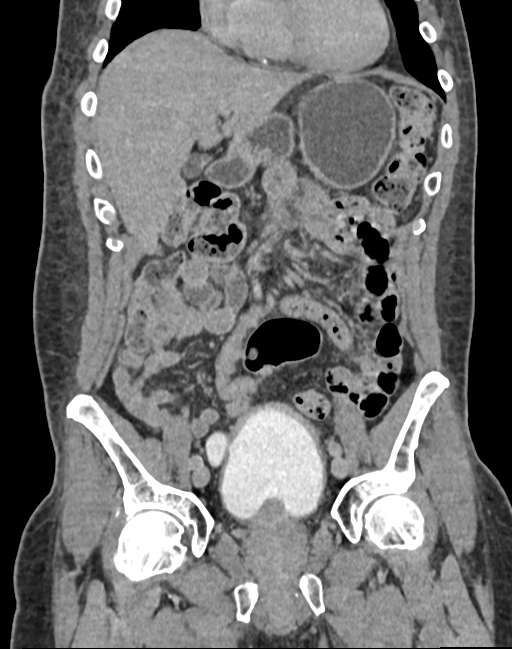
[im 53/158  bone]
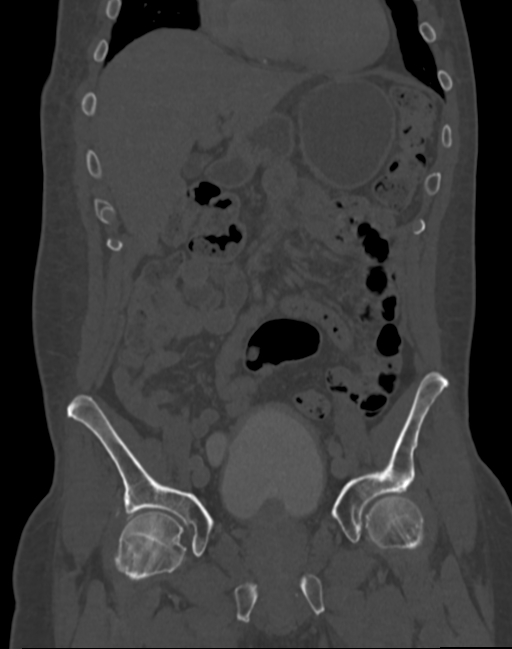
[im 105/158  soft-tissue]
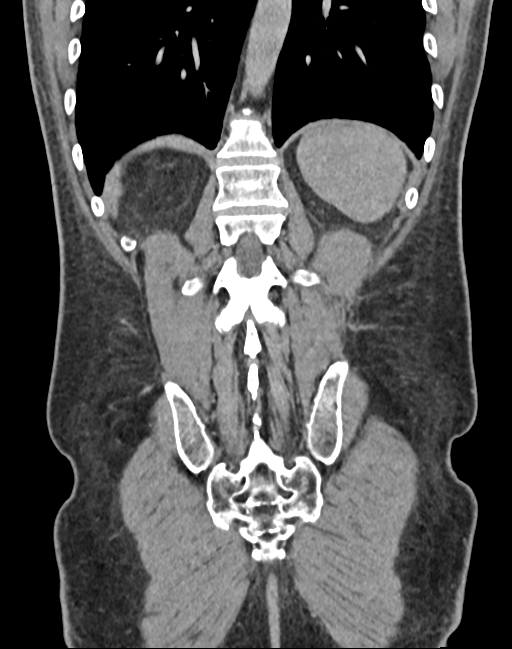

[11 of 46 positions shown; findings below may reference images not displayed]

FINDINGS: Lower chest: 4 mm left lower lobe subpleural nodule measured on the
previous study is similar. 6 mm subpleural nodule in the left lower
lobe is also stable.

Hepatobiliary: No focal abnormality within the liver parenchyma.
There is no evidence for gallstones, gallbladder wall thickening, or
pericholecystic fluid. No intrahepatic or extrahepatic biliary
dilation.

Pancreas: No focal mass lesion. No dilatation of the main duct. No
intraparenchymal cyst. No peripancreatic edema.

Spleen: No splenomegaly. No focal mass lesion.

Adrenals/Urinary Tract: No adrenal nodule or mass. Precontrast
imaging shows no stones in either kidney or ureter. No bladder
stones. Subtle linear hyper attenuation is seen along the mucosa of
the anterior gallbladder, in the region of the previous lesion and
is probably postsurgical.

Postcontrast imaging shows no suspicious abnormality in either
kidney. Delayed post-contrast imaging shows no wall thickening or
soft tissue filling defect in either intrarenal collecting system or
renal pelvis. Both ureters have normal CT imaging features.
Circumferential irregular bladder wall thickening is evident, more
prominent anteriorly with a midline focal area of bladder wall
thinning, in the region of the anterior bladder lesion seen on the
prior study.. The irregular anterior broad-based lesion seen on the
previous study is no longer evident. Right-sided bladder
diverticulum again noted.

Stomach/Bowel: Stomach is mildly fluid distended. Duodenum is
normally positioned as is the ligament of Treitz. No small bowel
wall thickening. No small bowel dilatation. No gross colonic mass.
No colonic wall thickening. No substantial diverticular change.

Vascular/Lymphatic: There is abdominal aortic atherosclerosis
without aneurysm. There is no gastrohepatic or hepatoduodenal
ligament lymphadenopathy. No intraperitoneal or retroperitoneal
lymphadenopathy. No pelvic sidewall lymphadenopathy.

Reproductive: Prostate gland is enlarged.

Other: No intraperitoneal free fluid.

Musculoskeletal: Bone windows reveal no worrisome lytic or sclerotic
osseous lesions.
IMPRESSION: 1. Postsurgical changes in the anterior bladder wall with no
residual bladder wall nodularity evident. There is some bladder wall
thickening on today's study which is probably treatment related
although infection could have this appearance on CT.
2. No lymphadenopathy in the abdomen or pelvis.
3. Stable 4 and 6 mm left lower lobe pulmonary nodules. Continued
attention on follow-up recommended.

## 2018-05-29 DIAGNOSIS — Z125 Encounter for screening for malignant neoplasm of prostate: Secondary | ICD-10-CM | POA: Diagnosis not present

## 2018-05-29 DIAGNOSIS — I10 Essential (primary) hypertension: Secondary | ICD-10-CM | POA: Diagnosis not present

## 2018-06-08 ENCOUNTER — Other Ambulatory Visit
Admission: RE | Admit: 2018-06-08 | Discharge: 2018-06-08 | Disposition: A | Discharge status: Home / Self Care | Payer: Medicare HMO | Source: Ambulatory Visit | Attending: Cardiovascular Disease | Admitting: Cardiovascular Disease

## 2018-06-08 DIAGNOSIS — M75100 Unspecified rotator cuff tear or rupture of unspecified shoulder, not specified as traumatic: Secondary | ICD-10-CM | POA: Diagnosis not present

## 2018-06-08 DIAGNOSIS — Z72820 Sleep deprivation: Secondary | ICD-10-CM | POA: Diagnosis not present

## 2018-06-08 DIAGNOSIS — I25118 Atherosclerotic heart disease of native coronary artery with other forms of angina pectoris: Secondary | ICD-10-CM | POA: Insufficient documentation

## 2018-06-08 DIAGNOSIS — M9901 Segmental and somatic dysfunction of cervical region: Secondary | ICD-10-CM | POA: Diagnosis not present

## 2018-06-08 DIAGNOSIS — M9902 Segmental and somatic dysfunction of thoracic region: Secondary | ICD-10-CM | POA: Diagnosis not present

## 2018-06-08 DIAGNOSIS — M791 Myalgia, unspecified site: Secondary | ICD-10-CM | POA: Diagnosis not present

## 2018-06-08 DIAGNOSIS — M6140 Other calcification of muscle, unspecified site: Secondary | ICD-10-CM | POA: Diagnosis not present

## 2018-06-08 LAB — LIPID PANEL
Cholesterol: 101 mg/dL (ref 0–200)
HDL: 57 mg/dL (ref >40)
LDL CALC: 37 mg/dL (ref 0–99)
TRIGLYCERIDES: 34 mg/dL (ref <150)
Total CHOL/HDL Ratio: 1.8 RATIO
VLDL: 7 mg/dL (ref 0–40)

## 2018-06-10 DIAGNOSIS — N401 Enlarged prostate with lower urinary tract symptoms: Secondary | ICD-10-CM | POA: Diagnosis not present

## 2018-06-15 DIAGNOSIS — Z125 Encounter for screening for malignant neoplasm of prostate: Secondary | ICD-10-CM | POA: Diagnosis not present

## 2018-06-15 DIAGNOSIS — Z79899 Other long term (current) drug therapy: Secondary | ICD-10-CM | POA: Diagnosis not present

## 2018-06-15 DIAGNOSIS — N401 Enlarged prostate with lower urinary tract symptoms: Secondary | ICD-10-CM | POA: Diagnosis not present

## 2018-06-15 DIAGNOSIS — Z8551 Personal history of malignant neoplasm of bladder: Secondary | ICD-10-CM | POA: Diagnosis not present

## 2018-06-16 DIAGNOSIS — E103312 Type 1 diabetes mellitus with moderate nonproliferative diabetic retinopathy with macular edema, left eye: Secondary | ICD-10-CM | POA: Diagnosis not present

## 2018-06-16 DIAGNOSIS — H43812 Vitreous degeneration, left eye: Secondary | ICD-10-CM | POA: Diagnosis not present

## 2018-06-17 DIAGNOSIS — Z79899 Other long term (current) drug therapy: Secondary | ICD-10-CM | POA: Diagnosis not present

## 2018-06-17 DIAGNOSIS — C674 Malignant neoplasm of posterior wall of bladder: Secondary | ICD-10-CM | POA: Diagnosis not present

## 2018-06-22 DIAGNOSIS — E1059 Type 1 diabetes mellitus with other circulatory complications: Secondary | ICD-10-CM | POA: Diagnosis not present

## 2018-06-22 DIAGNOSIS — E782 Mixed hyperlipidemia: Secondary | ICD-10-CM | POA: Diagnosis not present

## 2018-06-29 DIAGNOSIS — E1029 Type 1 diabetes mellitus with other diabetic kidney complication: Secondary | ICD-10-CM | POA: Diagnosis not present

## 2018-06-29 DIAGNOSIS — R809 Proteinuria, unspecified: Secondary | ICD-10-CM | POA: Diagnosis not present

## 2018-06-29 DIAGNOSIS — E1042 Type 1 diabetes mellitus with diabetic polyneuropathy: Secondary | ICD-10-CM | POA: Diagnosis not present

## 2018-06-29 DIAGNOSIS — E782 Mixed hyperlipidemia: Secondary | ICD-10-CM | POA: Diagnosis not present

## 2018-06-29 DIAGNOSIS — E1059 Type 1 diabetes mellitus with other circulatory complications: Secondary | ICD-10-CM | POA: Diagnosis not present

## 2018-06-29 DIAGNOSIS — E103212 Type 1 diabetes mellitus with mild nonproliferative diabetic retinopathy with macular edema, left eye: Secondary | ICD-10-CM | POA: Diagnosis not present

## 2018-07-06 ENCOUNTER — Other Ambulatory Visit: Payer: Self-pay | Admitting: Cardiovascular Disease

## 2018-07-08 DIAGNOSIS — N400 Enlarged prostate without lower urinary tract symptoms: Secondary | ICD-10-CM | POA: Diagnosis not present

## 2018-07-08 DIAGNOSIS — I10 Essential (primary) hypertension: Secondary | ICD-10-CM | POA: Diagnosis not present

## 2018-07-08 DIAGNOSIS — E782 Mixed hyperlipidemia: Secondary | ICD-10-CM | POA: Diagnosis not present

## 2018-07-14 DIAGNOSIS — L97522 Non-pressure chronic ulcer of other part of left foot with fat layer exposed: Secondary | ICD-10-CM | POA: Diagnosis not present

## 2018-07-21 DIAGNOSIS — E103312 Type 1 diabetes mellitus with moderate nonproliferative diabetic retinopathy with macular edema, left eye: Secondary | ICD-10-CM | POA: Diagnosis not present

## 2018-07-30 DIAGNOSIS — L03115 Cellulitis of right lower limb: Secondary | ICD-10-CM | POA: Diagnosis not present

## 2018-08-04 DIAGNOSIS — R300 Dysuria: Secondary | ICD-10-CM | POA: Diagnosis not present

## 2018-08-04 DIAGNOSIS — N323 Diverticulum of bladder: Secondary | ICD-10-CM | POA: Diagnosis not present

## 2018-08-04 DIAGNOSIS — Z8551 Personal history of malignant neoplasm of bladder: Secondary | ICD-10-CM | POA: Diagnosis not present

## 2018-08-04 DIAGNOSIS — N401 Enlarged prostate with lower urinary tract symptoms: Secondary | ICD-10-CM | POA: Diagnosis not present

## 2018-08-10 DIAGNOSIS — L97522 Non-pressure chronic ulcer of other part of left foot with fat layer exposed: Secondary | ICD-10-CM | POA: Diagnosis not present

## 2018-08-12 ENCOUNTER — Other Ambulatory Visit (HOSPITAL_BASED_OUTPATIENT_CLINIC_OR_DEPARTMENT_OTHER): Payer: Self-pay | Admitting: Internal Medicine

## 2018-08-12 ENCOUNTER — Encounter: Payer: Medicare HMO | Attending: Internal Medicine | Admitting: Internal Medicine

## 2018-08-12 DIAGNOSIS — I70203 Unspecified atherosclerosis of native arteries of extremities, bilateral legs: Secondary | ICD-10-CM | POA: Diagnosis not present

## 2018-08-12 DIAGNOSIS — L97522 Non-pressure chronic ulcer of other part of left foot with fat layer exposed: Secondary | ICD-10-CM | POA: Diagnosis not present

## 2018-08-12 DIAGNOSIS — M86672 Other chronic osteomyelitis, left ankle and foot: Secondary | ICD-10-CM | POA: Insufficient documentation

## 2018-08-12 DIAGNOSIS — E1069 Type 1 diabetes mellitus with other specified complication: Secondary | ICD-10-CM | POA: Diagnosis not present

## 2018-08-12 DIAGNOSIS — L97211 Non-pressure chronic ulcer of right calf limited to breakdown of skin: Secondary | ICD-10-CM | POA: Insufficient documentation

## 2018-08-12 DIAGNOSIS — E10621 Type 1 diabetes mellitus with foot ulcer: Secondary | ICD-10-CM | POA: Insufficient documentation

## 2018-08-12 DIAGNOSIS — Z955 Presence of coronary angioplasty implant and graft: Secondary | ICD-10-CM | POA: Insufficient documentation

## 2018-08-12 DIAGNOSIS — I2510 Atherosclerotic heart disease of native coronary artery without angina pectoris: Secondary | ICD-10-CM | POA: Diagnosis not present

## 2018-08-12 DIAGNOSIS — Z794 Long term (current) use of insulin: Secondary | ICD-10-CM | POA: Insufficient documentation

## 2018-08-12 DIAGNOSIS — L97523 Non-pressure chronic ulcer of other part of left foot with necrosis of muscle: Secondary | ICD-10-CM | POA: Diagnosis not present

## 2018-08-12 DIAGNOSIS — Z87891 Personal history of nicotine dependence: Secondary | ICD-10-CM | POA: Insufficient documentation

## 2018-08-12 DIAGNOSIS — I10 Essential (primary) hypertension: Secondary | ICD-10-CM | POA: Diagnosis not present

## 2018-08-12 DIAGNOSIS — Z85828 Personal history of other malignant neoplasm of skin: Secondary | ICD-10-CM | POA: Insufficient documentation

## 2018-08-12 DIAGNOSIS — I252 Old myocardial infarction: Secondary | ICD-10-CM | POA: Insufficient documentation

## 2018-08-12 DIAGNOSIS — L97219 Non-pressure chronic ulcer of right calf with unspecified severity: Secondary | ICD-10-CM | POA: Diagnosis not present

## 2018-08-12 DIAGNOSIS — Z9221 Personal history of antineoplastic chemotherapy: Secondary | ICD-10-CM | POA: Insufficient documentation

## 2018-08-12 DIAGNOSIS — E1051 Type 1 diabetes mellitus with diabetic peripheral angiopathy without gangrene: Secondary | ICD-10-CM | POA: Insufficient documentation

## 2018-08-12 DIAGNOSIS — Z8551 Personal history of malignant neoplasm of bladder: Secondary | ICD-10-CM | POA: Insufficient documentation

## 2018-08-12 DIAGNOSIS — S81809A Unspecified open wound, unspecified lower leg, initial encounter: Secondary | ICD-10-CM

## 2018-08-12 DIAGNOSIS — I739 Peripheral vascular disease, unspecified: Secondary | ICD-10-CM | POA: Diagnosis not present

## 2018-08-12 DIAGNOSIS — E10622 Type 1 diabetes mellitus with other skin ulcer: Secondary | ICD-10-CM | POA: Insufficient documentation

## 2018-08-13 ENCOUNTER — Other Ambulatory Visit: Payer: Self-pay | Admitting: Internal Medicine

## 2018-08-13 ENCOUNTER — Other Ambulatory Visit (HOSPITAL_BASED_OUTPATIENT_CLINIC_OR_DEPARTMENT_OTHER): Payer: Self-pay | Admitting: Internal Medicine

## 2018-08-13 DIAGNOSIS — T8189XA Other complications of procedures, not elsewhere classified, initial encounter: Secondary | ICD-10-CM

## 2018-08-14 NOTE — Progress Notes (Signed)
KELLER, BOUNDS ([id number]) Visit Report for 08/12/2018 Abuse/Suicide Risk Screen Details Patient Name: [name], [name] Date of Service: 08/12/2018 8:45 AM Medical Record Number: [id number] Patient Account Number: [id number] Date of Birth/Sex: Feb 28, 1946 (72 y.o. M) Treating RN: Montey Hora Primary Care Shayne Diguglielmo: [name] Other Clinician: Referring Ziana Heyliger: [name] Treating Jleigh Striplin/Extender: Tito Dine in Treatment: 0 Abuse/Suicide Risk Screen Items Answer ABUSE/SUICIDE RISK SCREEN: Has anyone close to you tried to hurt or harm you recentlyo No Do you feel uncomfortable with anyone in your familyo No Has anyone forced you do things that you didnot want to doo No Do you have any thoughts of harming yourselfo No Patient displays signs or symptoms of abuse and/or neglect. No Electronic Signature(s) Signed: 08/12/2018 5:06:12 PM By: Montey Hora Entered By: Montey Hora on 08/12/2018 09:09:48 Tancredi, [name] ([id number]) -------------------------------------------------------------------------------- Activities of Daily Living Details Patient Name: [name], [name] Date of Service: 08/12/2018 8:45 AM Medical Record Number: [id number] Patient Account Number: [id number] Date of Birth/Sex: 1945-12-31 (72 y.o. M) Treating RN: Montey Hora Primary Care Amoria Mclees: [name] Other Clinician: Referring Aruna Nestler: [name] Treating Graesyn Schreifels/Extender: Tito Dine in Treatment: 0 Activities of Daily Living Items Answer Activities of Daily Living (Please select one for each item) Drive Automobile Completely Able Take Medications Completely Able Use Telephone Completely Able Care for Appearance Completely Able Use Toilet Completely Able Bath / Shower Completely Able Dress Self Completely Able Feed Self Completely Able Walk Completely Able Get In / Out Bed Completely Able Housework Completely Able Prepare Meals  Completely Able Handle Money Completely Able Shop for Self Completely Able Electronic Signature(s) Signed: 08/12/2018 5:06:12 PM By: Montey Hora Entered By: Montey Hora on 08/12/2018 09:10:16 Hornstein, [name] ([id number]) -------------------------------------------------------------------------------- Education Assessment Details Patient Name: [name], [name] Date of Service: 08/12/2018 8:45 AM Medical Record Number: [id number] Patient Account Number: [id number] Date of Birth/Sex: 06/13/1946 (72 y.o. M) Treating RN: Montey Hora Primary Care Joran Kallal: [name] Other Clinician: Referring Spence Soberano: [name] Treating Kaedon Fanelli/Extender: Tito Dine in Treatment: 0 Primary Learner Assessed: Patient Learning Preferences/Education Level/Primary Language Learning Preference: Explanation, Demonstration Highest Education Level: College or Above Preferred Language: English Cognitive Barrier Assessment/Beliefs Language Barrier: No Translator Needed: No Memory Deficit: No Emotional Barrier: No Cultural/Religious Beliefs Affecting Medical Care: No Physical Barrier Assessment Impaired Vision: No Impaired Hearing: No Decreased Hand dexterity: No Knowledge/Comprehension Assessment Knowledge Level: Medium Comprehension Level: Medium Ability to understand written Medium instructions: Ability to understand verbal Medium instructions: Motivation Assessment Anxiety Level: Calm Cooperation: Cooperative Education Importance: Acknowledges Need Interest in Health Problems: Asks Questions Perception: Coherent Willingness to Engage in Self- Medium Management Activities: Readiness to Engage in Self- Medium Management Activities: Electronic Signature(s) Signed: 08/12/2018 5:06:12 PM By: Montey Hora Entered By: Montey Hora on 08/12/2018 09:11:02 Camak, [name]  ([id number]) -------------------------------------------------------------------------------- Fall Risk Assessment Details Patient Name: [name], [name] Date of Service: 08/12/2018 8:45 AM Medical Record Number: [id number] Patient Account Number: [id number] Date of Birth/Sex: 06/07/1946 (72 y.o. M) Treating RN: Montey Hora Primary Care Kadeem Hyle: [name] Other Clinician: Referring Sunni Richardson: [name] Treating Foy Mungia/Extender: Tito Dine in Treatment: 0 Fall Risk Assessment Items Have you had 2 or more falls in the last 12 monthso 0 No Have you had any fall that resulted in injury in the last 12 monthso 0 No FALL RISK ASSESSMENT: History of falling - immediate or within 3 months 0 No Secondary diagnosis 0 No Ambulatory aid None/bed rest/wheelchair/nurse 0 Yes Crutches/cane/walker 0 No Furniture  0 No IV Access/Saline Lock 0 No Gait/Training Normal/bed rest/immobile 0 No Weak 10 Yes Impaired 0 No Mental Status Oriented to own ability 0 Yes Electronic Signature(s) Signed: 08/12/2018 5:06:12 PM By: Montey Hora Entered By: Montey Hora on 08/12/2018 09:11:29 Ionescu, [name] ([id number]) -------------------------------------------------------------------------------- Foot Assessment Details Patient Name: [name], [name] Date of Service: 08/12/2018 8:45 AM Medical Record Number: [id number] Patient Account Number: [id number] Date of Birth/Sex: September 16, 1946 (72 y.o. M) Treating RN: Montey Hora Primary Care Demarques Pilz: [name] Other Clinician: Referring Lilliam Chamblee: [name] Treating Phoenix Riesen/Extender: Tito Dine in Treatment: 0 Foot Assessment Items Site Locations + = Sensation present, - = Sensation absent, C = Callus, U = Ulcer R = Redness, W = Warmth, M = Maceration, PU = Pre-ulcerative lesion F = Fissure, S = Swelling, D = Dryness Assessment Right: Left: Other Deformity: No No Prior Foot Ulcer: No  No Prior Amputation: No No Charcot Joint: No No Ambulatory Status: Ambulatory Without Help Gait: Steady Electronic Signature(s) Signed: 08/12/2018 5:06:12 PM By: Montey Hora Entered By: Montey Hora on 08/12/2018 09:13:15 Degrace, [name] ([id number]) -------------------------------------------------------------------------------- Nutrition Risk Assessment Details Patient Name: [name], [name] Date of Service: 08/12/2018 8:45 AM Medical Record Number: [id number] Patient Account Number: [id number] Date of Birth/Sex: 07/25/1946 (72 y.o. M) Treating RN: Montey Hora Primary Care Rochelle Nephew: [name] Other Clinician: Referring Josip Merolla: [name] Treating Isadore Palecek/Extender: Tito Dine in Treatment: 0 Height (in): 69 Weight (lbs): 183.2 Body Mass Index (BMI): 27.1 Nutrition Risk Assessment Items NUTRITION RISK SCREEN: I have an illness or condition that made me change the kind and/or amount of 0 No food I eat I eat fewer than two meals per day 0 No I eat few fruits and vegetables, or milk products 0 No I have three or more drinks of beer, liquor or wine almost every day 0 No I have tooth or mouth problems that make it hard for me to eat 0 No I don't always have enough money to buy the food I need 0 No I eat alone most of the time 0 No I take three or more different prescribed or over-the-counter drugs a day 1 Yes Without wanting to, I have lost or gained 10 pounds in the last six months 0 No I am not always physically able to shop, cook and/or feed myself 0 No Nutrition Protocols Good Risk Protocol 0 No interventions needed Moderate Risk Protocol Electronic Signature(s) Signed: 08/12/2018 5:06:12 PM By: Montey Hora Entered By: Montey Hora on 08/12/2018 09:11:35

## 2018-08-15 NOTE — Progress Notes (Signed)
[name], [name] ([id number]) Visit Report for 08/12/2018 Allergy List Details Patient Name: [name], [name] Date of Service: 08/12/2018 8:45 AM Medical Record Number: [id number] Patient Account Number: [id number] Date of Birth/Sex: [birth year] (72 y.o. M) Treating RN: [name] Primary Care [name]: [name] Other Clinician: Referring Tiney Zipper: [name] Treating [name]/Extender: [name] Weeks in Treatment: 0 Allergies Active Allergies No Known Drug Intolerances Allergy Notes Electronic Signature(s) Signed: 08/12/2018 5:06:12 PM By: [name] Entered By: [name] on 08/12/2018 09:05:37 [name], [name] ([id number]) -------------------------------------------------------------------------------- Arrival Information Details Patient Name: [name], [name] Date of Service: 08/12/2018 8:45 AM Medical Record Number: [id number] Patient Account Number: [id number] Date of Birth/Sex: 03/30/1946 (72 y.o. M) Treating RN: [name] Primary Care Yuleidy Rappleye: [name] Other Clinician: Referring Briel Gallicchio: [name] Treating [name]/Extender: [name] in Treatment: 0 Visit Information Patient Arrived: Ambulatory Arrival Time: 08:53 Accompanied By: self Transfer Assistance: None Patient Identification Verified: Yes Secondary Verification Process Completed: Yes History Since Last Visit Added or deleted any medications: No Any new allergies or adverse reactions: No Had a fall or experienced change in activities of daily living that may affect risk of falls: No Signs or symptoms of abuse/neglect since last visito No Hospitalized since last visit: No Implantable device outside of the clinic excluding cellular tissue based products placed in the center since last visit: No Electronic Signature(s) Signed: 08/12/2018 1:31:10 PM By: [name] RCP, RRT, CHT Entered By: [name] on  08/12/2018 08:54:26 [name], [name] ([id number]) -------------------------------------------------------------------------------- Clinic Level of Care Assessment Details Patient Name: [name], [name] Date of Service: 08/12/2018 8:45 AM Medical Record Number: [id number] Patient Account Number: [id number] Date of Birth/Sex: 07/26/1946 (72 y.o. M) Treating RN: [name] Primary Care Chlora Mcbain: [name] Other Clinician: Referring [name]: [name] Treating [name]/Extender: [name] in Treatment: 0 Clinic Level of Care Assessment Items TOOL 2 Quantity Score []  - Use when only an EandM is performed on the INITIAL visit 0 ASSESSMENTS - Nursing Assessment / Reassessment X - General Physical Exam (combine w/ comprehensive assessment (listed just below) when 1 20 performed on new pt. evals) X- 1 25 Comprehensive Assessment (HX, ROS, Risk Assessments, Wounds Hx, etc.) ASSESSMENTS - Wound and Skin Assessment / Reassessment []  - Simple Wound Assessment / Reassessment - one wound 0 X- 2 5 Complex Wound Assessment / Reassessment - multiple wounds []  - 0 Dermatologic / Skin Assessment (not related to wound area) ASSESSMENTS - Ostomy and/or Continence Assessment and Care []  - Incontinence Assessment and Management 0 []  - 0 Ostomy Care Assessment and Management (repouching, etc.) PROCESS - Coordination of Care X - Simple Patient / Family Education for ongoing care 1 15 []  - 0 Complex (extensive) Patient / Family Education for ongoing care X- 1 10 Staff obtains Programmer, systems, Records, Test Results / Process Orders []  - 0 Staff telephones HHA, Nursing Homes / Clarify orders / etc []  - 0 Routine Transfer to another Facility (non-emergent condition) []  - 0 Routine Hospital Admission (non-emergent condition) X- 1 15 New Admissions / Biomedical engineer / Ordering NPWT, Apligraf, etc. []  - 0 Emergency Hospital Admission (emergent condition) X- 1  10 Simple Discharge Coordination []  - 0 Complex (extensive) Discharge Coordination PROCESS - Special Needs []  - Pediatric / Minor Patient Management 0 []  - 0 Isolation Patient Management [name], [name] ([id number]) []  - 0 Hearing / Language / Visual special needs []  - 0 Assessment of Community assistance (transportation, D/C planning, etc.) []  - 0  Additional assistance / Altered mentation []  - 0 Support Surface(s) Assessment (bed, cushion, seat, etc.) INTERVENTIONS - Wound Cleansing / Measurement X - Wound Imaging (photographs - any number of wounds) 1 5 []  - 0 Wound Tracing (instead of photographs) []  - 0 Simple Wound Measurement - one wound X- 2 5 Complex Wound Measurement - multiple wounds []  - 0 Simple Wound Cleansing - one wound X- 2 5 Complex Wound Cleansing - multiple wounds INTERVENTIONS - Wound Dressings []  - Small Wound Dressing one or multiple wounds 0 []  - 0 Medium Wound Dressing one or multiple wounds X- 1 20 Large Wound Dressing one or multiple wounds []  - 0 Application of Medications - injection INTERVENTIONS - Miscellaneous []  - External ear exam 0 []  - 0 Specimen Collection (cultures, biopsies, blood, body fluids, etc.) []  - 0 Specimen(s) / Culture(s) sent or taken to Lab for analysis []  - 0 Patient Transfer (multiple staff / Civil Service fast streamer / Similar devices) []  - 0 Simple Staple / Suture removal (25 or less) []  - 0 Complex Staple / Suture removal (26 or more) []  - 0 Hypo / Hyperglycemic Management (close monitor of Blood Glucose) X- 1 15 Ankle / Brachial Index (ABI) - do not check if billed separately Has the patient been seen at the hospital within the last three years: Yes Total Score: 165 Level Of Care: New/Established - Level 5 Electronic Signature(s) Signed: 08/13/2018 3:05:38 PM By: [name], BSN, RN, CWS, Kim RN, BSN Entered By: [name], BSN, RN, CWS, [name] on 08/12/2018 10:04:56 [name], [name]  ([id number]) -------------------------------------------------------------------------------- Encounter Discharge Information Details Patient Name: [name], [name] Date of Service: 08/12/2018 8:45 AM Medical Record Number: [id number] Patient Account Number: [id number] Date of Birth/Sex: October 23, 1946 (72 y.o. M) Treating RN: [name] Primary Care Jamel Holzmann: [name] Other Clinician: Referring Aulton Routt: [name] Treating Orlandria Kissner/Extender: [name] in Treatment: 0 Encounter Discharge Information Items Discharge Condition: Stable Ambulatory Status: Ambulatory Discharge Destination: Home Transportation: Private Auto Schedule Follow-up Appointment: Yes Clinical Summary of Care: Electronic Signature(s) Signed: 08/13/2018 3:05:38 PM By: [name], BSN, RN, CWS, Kim RN, BSN Entered By: [name], BSN, RN, CWS, [name] on 08/12/2018 10:23:32 [place], [name] ([id number]) -------------------------------------------------------------------------------- Lower Extremity Assessment Details Patient Name: [name], [name] H. Date of Service: 08/12/2018 8:45 AM Medical Record Number: [id number] Patient Account Number: [id number] Date of Birth/Sex: December 20, 1945 (72 y.o. M) Treating RN: [name] Primary Care Kailen Name: [name] Other Clinician: Referring Desi Rowe: [name] Treating Dhaval Woo/Extender: [name] in Treatment: 0 Vascular Assessment Pulses: Dorsalis Pedis Palpable: [Left:Yes] [Right:Yes] Doppler Audible: [Left:Yes] [Right:Yes] Posterior Tibial Palpable: [Left:Yes] [Right:Yes] Doppler Audible: [Left:Yes] [Right:Yes] Extremity colors, hair growth, and conditions: Extremity Color: [Left:Normal] [Right:Normal] Hair Growth on Extremity: [Left:Yes] [Right:Yes] Temperature of Extremity: [Left:Warm] [Right:Warm] Capillary Refill: [Left:< 3 seconds] [Right:< 3 seconds] Blood Pressure: Brachial: [Left:126] Dorsalis Pedis: 226  [Left:Dorsalis Pedis:] Ankle: Posterior Tibial: 232 [Left:Posterior Tibial: 1.84] Toe Nail Assessment Left: Right: Thick: Yes Yes Discolored: Yes Yes Deformed: No No Improper Length and Hygiene: No No Notes ABI Ama R >300 Electronic Signature(s) Signed: 08/12/2018 5:06:12 PM By: [name] Entered By: [name] on 08/12/2018 09:37:07 [place], [name] ([id number]) -------------------------------------------------------------------------------- Multi Wound Chart Details Patient Name: Secord, Hiroto H. Date of Service: 08/12/2018 8:45 AM Medical Record Number: [id number] Patient Account Number: [id number] Date of Birth/Sex: 12-29-1945 (72 y.o. M) Treating RN: [name] Primary Care Elner Seifert: [name] Other Clinician: Referring Jaleal Schliep: [name] Treating Ferrel Simington/Extender: [name] in Treatment: 0 Vital Signs Height(in): 56  Pulse(bpm): 67 Weight(lbs): 183.2 Blood Pressure(mmHg): 139/67 Body Mass Index(BMI): 27 Temperature(F): 97.7 Respiratory Rate 16 (breaths/min): Photos: [2:No Photos] [3:No Photos] [N/A:N/A] Wound Location: [2:Right Lower Leg - Anterior] [3:Left Toe Great] [N/A:N/A] Wounding Event: [2:Gradually Appeared] [3:Gradually Appeared] [N/A:N/A] Primary Etiology: [2:Diabetic Wound/Ulcer of the Diabetic Wound/Ulcer of the N/A Lower Extremity] [3:Lower Extremity] Comorbid History: [2:Coronary Artery Disease, Hypertension, Type I Diabetes, Hypertension, Type I Diabetes, Osteoarthritis, Received Chemotherapy] [3:Coronary Artery Disease, Osteoarthritis, Received Chemotherapy] [N/A:N/A] Date Acquired: [2:07/13/2018] [3:07/20/2018] [N/A:N/A] Weeks of Treatment: [2:0] [3:0] [N/A:N/A] Wound Status: [2:Open] [3:Open] [N/A:N/A] Pending Amputation on [2:No] [3:Yes] [N/A:N/A] Presentation: Measurements L x W x D [2:0.3x0.3x0.1] [3:0.4x0.5x0.2] [N/A:N/A] (cm) Area (cm) : [2:0.071] [3:0.157] [N/A:N/A] Volume (cm) : [2:0.007]  [3:0.031] [N/A:N/A] % Reduction in Area: [2:0.00%] [3:0.00%] [N/A:N/A] % Reduction in Volume: [2:0.00%] [3:0.00%] [N/A:N/A] Classification: [2:Grade 1] [3:Grade 1] [N/A:N/A] Exudate Amount: [2:Medium] [3:Medium] [N/A:N/A] Exudate Type: [2:Serous] [3:Serous] [N/A:N/A] Exudate Color: [2:amber] [3:amber] [N/A:N/A] Wound Margin: [2:Flat and Intact] [3:Flat and Intact] [N/A:N/A] Granulation Amount: [2:None Present (0%)] [3:Medium (34-66%)] [N/A:N/A] Granulation Quality: [2:N/A] [3:Pink] [N/A:N/A] Necrotic Amount: [2:Large (67-100%)] [3:Medium (34-66%)] [N/A:N/A] Necrotic Tissue: [2:Eschar] [3:Adherent Slough] [N/A:N/A] Exposed Structures: [2:Fascia: No Fat Layer (Subcutaneous Tissue) Exposed: No Tendon: No Muscle: No Joint: No Bone: No] [3:Fat Layer (Subcutaneous Tissue) Exposed: Yes Fascia: No Tendon: No Muscle: No Joint: No Bone: No] [N/A:N/A] Epithelialization: [2:None] [3:None] [N/A:N/A] Periwound Skin Texture: Excoriation: No Excoriation: No N/A Induration: No Induration: No Callus: No Callus: No Crepitus: No Crepitus: No Rash: No Rash: No Scarring: No Scarring: No Periwound Skin Moisture: Maceration: No Maceration: No N/A Dry/Scaly: No Dry/Scaly: No Periwound Skin Color: Erythema: Yes Erythema: Yes N/A Atrophie Blanche: No Atrophie Blanche: No Cyanosis: No Cyanosis: No Ecchymosis: No Ecchymosis: No Hemosiderin Staining: No Hemosiderin Staining: No Mottled: No Mottled: No Pallor: No Pallor: No Rubor: No Rubor: No Erythema Location: Circumferential Circumferential N/A Temperature: No Abnormality No Abnormality N/A Tenderness on Palpation: No No N/A Wound Preparation: Ulcer Cleansing: Ulcer Cleansing: N/A Rinsed/Irrigated with Saline Rinsed/Irrigated with Saline Topical Anesthetic Applied: Topical Anesthetic Applied: Other: lidocaine 4% Other: lidocaine 4% Treatment Notes Wound #2 (Right, Anterior Lower Leg) 1. Cleansed with: Clean wound with Normal  Saline 2. Anesthetic Topical Lidocaine 4% cream to wound bed prior to debridement 4. Dressing Applied: Prisma Ag 5. Secondary Dressing Applied Dry Gauze Notes kerlix and coban Wound #3 (Left Toe Great) 1. Cleansed with: Clean wound with Normal Saline 2. Anesthetic Topical Lidocaine 4% cream to wound bed prior to debridement 4. Dressing Applied: Santyl Ointment 5. Secondary Dressing Applied Dry Gauze Kerlix/Conform Electronic Signature(s) Signed: 08/13/2018 5:56:52 AM By: Linton Ham MD Entered By: Linton Ham on 08/12/2018 10:27:53 [name], [name] ([id number]) [name], [name] ([id number]) -------------------------------------------------------------------------------- Multi-Disciplinary Care Plan Details Patient Name: [name], [name] Date of Service: 08/12/2018 8:45 AM Medical Record Number: [id number] Patient Account Number: [id number] Date of Birth/Sex: July 15, 1946 (73 y.o. M) Treating RN: [name] Primary Care Deysha Cartier: [name] Other Clinician: Referring Cinque Begley: [name] Treating Cassy Sprowl/Extender: [name] in Treatment: 0 Active Inactive ` Orientation to the Wound Care Program Nursing Diagnoses: Knowledge deficit related to the wound healing center program Goals: Patient/caregiver will verbalize understanding of the [place] Program Date Initiated: 08/12/2018 Target Resolution Date: 09/12/2018 Goal Status: Active Interventions: Provide education on orientation to the wound center Notes: ` Venous Leg Ulcer Nursing Diagnoses: Actual venous Insuffiency (use after diagnosis is confirmed) Goals: Patient will maintain optimal edema control Date Initiated: 08/12/2018 Target Resolution Date: 09/12/2018 Goal Status: Active Interventions:  Assess peripheral edema status every visit. Notes: ` Wound/Skin Impairment Nursing Diagnoses: Impaired tissue integrity Goals: Ulcer/skin breakdown will have a  volume reduction of 30% by week 4 Date Initiated: 08/12/2018 Target Resolution Date: 09/12/2018 Goal Status: Active Interventions: JACKY, DROSS ([id number]) Assess ulceration(s) every visit Treatment Activities: Topical wound management initiated : 08/12/2018 Notes: Electronic Signature(s) Signed: 08/13/2018 3:05:38 PM By: [name], BSN, RN, CWS, Kim RN, BSN Entered By: [name], BSN, RN, CWS, [name] on 08/12/2018 09:55:13 [name], [name] ([id number]) -------------------------------------------------------------------------------- Pain Assessment Details Patient Name: Ungerer, Trindon H. Date of Service: 08/12/2018 8:45 AM Medical Record Number: [id number] Patient Account Number: [id number] Date of Birth/Sex: 1945-12-13 (72 y.o. M) Treating RN: [name] Primary Care Payam Gribble: [name] Other Clinician: Referring Cam Harnden: [name] Treating Maleaha Hughett/Extender: [name] in Treatment: 0 Active Problems Location of Pain Severity and Description of Pain Patient Has Paino No Site Locations Pain Management and Medication Current Pain Management: Electronic Signature(s) Signed: 08/12/2018 1:31:10 PM By: [name] RCP, RRT, CHT Signed: 08/13/2018 3:05:38 PM By: [name], BSN, RN, CWS, Kim RN, BSN Entered By: [name] on 08/12/2018 08:54:33 [name], [name] ([id number]) -------------------------------------------------------------------------------- Patient/Caregiver Education Details Patient Name: [name] Croft. Date of Service: 08/12/2018 8:45 AM Medical Record Number: [id number] Patient Account Number: [id number] Date of Birth/Gender: [birth year] (72 y.o. M) Treating RN: [name] Primary Care Physician: [name] Other Clinician: Referring Physician: [name] Treating Physician/Extender: [name] in Treatment: 0 Education Assessment Education Provided To: Patient Education  Topics Provided Elevated Blood Sugar/ Impact on Healing: Handouts: Elevated Blood Sugars: How Do They Affect Wound Healing Methods: Demonstration, Explain/Verbal Responses: State content correctly Wound/Skin Impairment: Handouts: Caring for Your Ulcer Methods: Demonstration, Explain/Verbal Responses: State content correctly Electronic Signature(s) Signed: 08/13/2018 3:05:38 PM By: [name], BSN, RN, CWS, Kim RN, BSN Entered By: [name], BSN, RN, CWS, [name] on 08/12/2018 10:23:37 [name], [name] ([id number]) -------------------------------------------------------------------------------- Wound Assessment Details Patient Name: [name], [name] Date of Service: 08/12/2018 8:45 AM Medical Record Number: [id number] Patient Account Number: [id number] Date of Birth/Sex: 05-Dec-1945 (72 y.o. M) Treating RN: [name] Primary Care Damyia Strider: [name] Other Clinician: Referring Tremon Sainvil: [name] Treating Ramisa Duman/Extender: [name] in Treatment: 0 Wound Status Wound Number: 2 Primary Diabetic Wound/Ulcer of the Lower Extremity Etiology: Wound Location: Right Lower Leg - Anterior Wound Open Wounding Event: Gradually Appeared Status: Date Acquired: 07/13/2018 Comorbid Coronary Artery Disease, Hypertension, Type I Weeks Of Treatment: 0 History: Diabetes, Osteoarthritis, Received Clustered Wound: No Chemotherapy Wound Measurements Length: (cm) 0.3 Width: (cm) 0.3 Depth: (cm) 0.1 Area: (cm) 0.071 Volume: (cm) 0.007 % Reduction in Area: 0% % Reduction in Volume: 0% Epithelialization: None Tunneling: No Undermining: No Wound Description Classification: Grade 1 Foul Odo Wound Margin: Flat and Intact Slough/F Exudate Amount: Medium Exudate Type: Serous Exudate Color: amber r After Cleansing: No ibrino No Wound Bed Granulation Amount: None Present (0%) Exposed Structure Necrotic Amount: Large (67-100%) Fascia Exposed: No Necrotic Quality:  Eschar Fat Layer (Subcutaneous Tissue) Exposed: No Tendon Exposed: No Muscle Exposed: No Joint Exposed: No Bone Exposed: No Periwound Skin Texture Texture Color No Abnormalities Noted: No No Abnormalities Noted: No Callus: No Atrophie Blanche: No Crepitus: No Cyanosis: No Excoriation: No Ecchymosis: No Induration: No Erythema: Yes Rash: No Erythema Location: Circumferential Scarring: No Hemosiderin Staining: No Mottled: No Moisture Pallor: No No Abnormalities Noted: No Rubor: No Dry / Scaly: No Maceration: No Temperature / Pain Viglione, Dejan H. ([id number]) Temperature: No Abnormality Wound Preparation Ulcer  Cleansing: Rinsed/Irrigated with Saline Topical Anesthetic Applied: Other: lidocaine 4%, Treatment Notes Wound #2 (Right, Anterior Lower Leg) 1. Cleansed with: Clean wound with Normal Saline 2. Anesthetic Topical Lidocaine 4% cream to wound bed prior to debridement 4. Dressing Applied: Prisma Ag 5. Secondary Dressing Applied Dry Gauze Notes kerlix and coban Electronic Signature(s) Signed: 08/12/2018 5:06:12 PM By: [name] Entered By: [name] on 08/12/2018 88:32:54 [name], [name] ([id number]) -------------------------------------------------------------------------------- Wound Assessment Details Patient Name: [name], [name] Date of Service: 08/12/2018 8:45 AM Medical Record Number: [id number] Patient Account Number: [id number] Date of Birth/Sex: 07/07/1946 (72 y.o. M) Treating RN: [name] Primary Care Sukari Grist: [name] Other Clinician: Referring Alfred Eckley: [name] Treating Fidencio Duddy/Extender: [name] in Treatment: 0 Wound Status Wound Number: 3 Primary Diabetic Wound/Ulcer of the Lower Extremity Etiology: Wound Location: Left Toe Great Wound Open Wounding Event: Gradually Appeared Status: Date Acquired: 07/20/2018 Comorbid Coronary Artery Disease, Hypertension, Type I Weeks Of  Treatment: 0 History: Diabetes, Osteoarthritis, Received Clustered Wound: No Chemotherapy Pending Amputation On Presentation Wound Measurements Length: (cm) 0.4 Width: (cm) 0.5 Depth: (cm) 0.2 Area: (cm) 0.157 Volume: (cm) 0.031 % Reduction in Area: 0% % Reduction in Volume: 0% Epithelialization: None Tunneling: No Undermining: No Wound Description Classification: Grade 1 Foul Odor Wound Margin: Flat and Intact Slough/Fi Exudate Amount: Medium Exudate Type: Serous Exudate Color: amber After Cleansing: No brino Yes Wound Bed Granulation Amount: Medium (34-66%) Exposed Structure Granulation Quality: Pink Fascia Exposed: No Necrotic Amount: Medium (34-66%) Fat Layer (Subcutaneous Tissue) Exposed: Yes Necrotic Quality: Adherent Slough Tendon Exposed: No Muscle Exposed: No Joint Exposed: No Bone Exposed: No Periwound Skin Texture Texture Color No Abnormalities Noted: No No Abnormalities Noted: No Callus: No Atrophie Blanche: No Crepitus: No Cyanosis: No Excoriation: No Ecchymosis: No Induration: No Erythema: Yes Rash: No Erythema Location: Circumferential Scarring: No Hemosiderin Staining: No Mottled: No Moisture Pallor: No No Abnormalities Noted: No Rubor: No Dry / Scaly: No Maceration: No Temperature / Pain Mcreynolds, Lamar H. ([id number]) Temperature: No Abnormality Wound Preparation Ulcer Cleansing: Rinsed/Irrigated with Saline Topical Anesthetic Applied: Other: lidocaine 4%, Electronic Signature(s) Signed: 08/12/2018 5:06:12 PM By: [name] Entered By: [name] on 08/12/2018 09:27:22 Marcello, [name] ([id number]) -------------------------------------------------------------------------------- Vitals Details Patient Name: Mccullers, Paolo H. Date of Service: 08/12/2018 8:45 AM Medical Record Number: [id number] Patient Account Number: [id number] Date of Birth/Sex: 03-03-1946 (72 y.o. M) Treating RN: [name] Primary Care  Yeira Gulden: [name] Other Clinician: Referring Lurlene Ronda: [name] Treating Britnee Mcdevitt/Extender: [name] in Treatment: 0 Vital Signs Time Taken: 08:53 Temperature (F): 97.7 Height (in): 69 Pulse (bpm): 67 Source: Stated Respiratory Rate (breaths/min): 16 Weight (lbs): 183.2 Blood Pressure (mmHg): 139/67 Source: Measured Reference Range: 80 - 120 mg / dl Body Mass Index (BMI): 27.1 Electronic Signature(s) Signed: 08/12/2018 1:31:10 PM By: [name] RCP, RRT, CHT Entered By: [name] on 08/12/2018 08:57:49

## 2018-08-15 NOTE — Progress Notes (Signed)
[name], [name] ([id number]) Visit Report for 08/12/2018 Chief Complaint Document Details Patient Name: [name], [name] Date of Service: 08/12/2018 8:45 AM Medical Record Number: [id number] Patient Account Number: [id number] Date of Birth/Sex: 11-01-1946 (72 y.o. M) Treating RN: Cornell Barman Primary Care Provider: [name] Other Clinician: Referring Provider: [name] Treating Provider/Extender: Tito Dine in Treatment: 0 Information Obtained from: Patient Chief Complaint Patients presents for treatment of an open diabetic ulcer the right lower extremity for about 6 months 08/12/18; patient returns to clinic today for review of wounds on his right calf. He also has a wound on his left great toe that apparently has been followed by his podiatrist Dr. Caryl Comes Electronic Signature(s) Signed: 08/13/2018 5:56:52 AM By: Linton Ham MD Entered By: Linton Ham on 08/12/2018 10:29:11 [name], [name] ([id number]) -------------------------------------------------------------------------------- HPI Details Patient Name: [name], [name] Date of Service: 08/12/2018 8:45 AM Medical Record Number: [id number] Patient Account Number: [id number] Date of Birth/Sex: 01-Sep-1946 (72 y.o. M) Treating RN: Cornell Barman Primary Care Provider: [name] Other Clinician: Referring Provider: [name] Treating Provider/Extender: Tito Dine in Treatment: 0 History of Present Illness Location: right anterior shin Quality: Patient reports No Pain. Severity: Patient states wound are getting worse. Duration: Patient has had the wound for > 6 months prior to seeking treatment at the wound center Context: The wound appeared gradually over time Modifying Factors: Other treatment(s) tried include: taken doxycycline and use bacitracin Associated Signs and Symptoms: Patient reports presence of swelling HPI Description: 72 year old male recently seen  by his PCP Dr. [name] who saw him for a wound on the right leg which is less tender and he is continuing to use bacitracin and has completed her course of doxycycline. Past medical history of coronary artery disease, diabetes mellitus type 1, hyperlipidemia, hypertension, MI, plantar fascial fibromatosis, pneumonia, status post foot surgery due to trauma on the left side, coronary angioplasty. he is a former smoker and quit in July 1996. last hemoglobin A1c was 7.8 % in April of this year 5/22 2017 -- biopsy of the right lower extremity was sent for pathology and the report is that of a basal cell carcinoma with edges of the biopsy are involved. I have called the patient over the phone( 03/28/16) and discussed the pathology report with him and he is agreeable to see a surgeon for excision and need full. I have also spoken personally to Dr. [name], of [facility] surgical and referred the patient for further wide excision of this lesion and have communicated this to the patient. READMISSION 08/12/18 This is a now 72 year old man with type 1 diabetes. Hemoglobin A1c on 4/29 was 7.9. He is listed as having a history of PAD in the [place] records although the patient doesn't recall this, doesn't complain of any symptoms of claudication and doesn't believe he has had any prior noninvasive arterial test. He is a nonsmoker. He was here in 2017 with a wound on his Right lower leg. This was biopsied in the clinic and pathology showed a basal cell carcinoma. He went on to have surgery on this area this is currently where I believe his current wounds are located. He generally bumped the lateral part of his right calf 2 weeks ago and has a superficial abrasion. He also has 2 small open areas on the medial part of the tibia in the same area. Our intake nurse noted a stitch coming out of this which was removed. These of the wound sees actually  come to the clinic for. However the more concerning area is an  area on the tip of the left great toe. He says that this was initially traumatic and he's been to see Dr. [name] podiatrist. Darral Dash been using what I think is Santyl to the wound tip. This has some depth. ABIs in this clinic were non-obtainable on the right and 1.84 on the left. Electronic Signature(s) Signed: 08/13/2018 5:56:52 AM By: Linton Ham MD Entered By: Linton Ham on 08/12/2018 10:34:32 [name], [name] ([id number]) -------------------------------------------------------------------------------- Physical Exam Details Patient Name: [name], [name] Date of Service: 08/12/2018 8:45 AM Medical Record Number: [id number] Patient Account Number: [id number] Date of Birth/Sex: [birth year] (72 y.o. M) Treating RN: Cornell Barman Primary Care Provider: [name] Other Clinician: Referring Provider: [name] Treating Provider/Extender: Tito Dine in Treatment: 0 Constitutional Sitting or standing Blood Pressure is within target range for patient.. Pulse regular and within target range for patient.Marland Kitchen Respirations regular, non-labored and within target range.. Temperature is normal and within the target range for the patient.Marland Kitchen appears in no distress. Eyes Conjunctivae clear. No discharge. Respiratory Respiratory effort is easy and symmetric bilaterally. Rate is normal at rest and on room air.. Cardiovascular Femoral arteries without bruits and pulses strong.. Palpable on the left but I think reduced versus the right. Edema present in both extremities. This is pitting. Lymphatic None palpable in the popliteal area bilaterally. Musculoskeletal Traumatic scar on the left dorsal ankle apparently from remote trauma. Integumentary (Hair, Skin) Nothing concerning the patient's current wounds looks like a malignancy. Neurological Sensation normal to touch, pin, and vibration. Sensation intact to 10 gram monofilament in extremities.Marland Kitchen Psychiatric No  evidence of depression, anxiety, or agitation. Calm, cooperative, and communicative. Appropriate interactions and affect.. Notes Wound exam; oOn the tip of his left great toe 5 folks a small probing wound. This is not coated bone but has roughly 2 mm to 3 mm in depth this was apparently traumatic. He also has some erythema coming down from the tip of the toe but without pain. Patient's sensation was intact oOn the right lateral lower tibial area a small abrasion-looking area. There is too punched out areas on the right medial lower tibial area. Our intake nurse remove the suture here and this could be the issue. The patient thinks that this is where his previous basal cell carcinoma was Electronic Signature(s) Signed: 08/13/2018 5:56:52 AM By: Linton Ham MD Entered By: Linton Ham on 08/12/2018 10:40:23 [name], [name] ([id number]) -------------------------------------------------------------------------------- Physician Orders Details Patient Name: [name], [name] Date of Service: 08/12/2018 8:45 AM Medical Record Number: [id number] Patient Account Number: [id number] Date of Birth/Sex: 22-Nov-1945 (72 y.o. M) Treating RN: Cornell Barman Primary Care Provider: [name] Other Clinician: Referring Provider: [name] Treating Provider/Extender: Tito Dine in Treatment: 0 Verbal / Phone Orders: No Diagnosis Coding Wound Cleansing Wound #2 Right,Anterior Lower Leg o Cleanse wound with mild soap and water o May shower with protection. Wound #3 Left Toe Great o Cleanse wound with mild soap and water o May shower with protection. Anesthetic (add to Medication List) Wound #2 Right,Anterior Lower Leg o Topical Lidocaine 4% cream applied to wound bed prior to debridement (In Clinic Only). Wound #3 Left Toe Great o Topical Lidocaine 4% cream applied to wound bed prior to debridement (In Clinic Only). Primary Wound Dressing Wound #2  Right,Anterior Lower Leg o Silver Collagen Wound #3 Left Toe Great o Santyl Ointment Secondary Dressing Wound #2 Right,Anterior Lower Leg o ABD pad  Wound #3 Left Toe Great o ABD pad o Conform/Kerlix Dressing Change Frequency Wound #2 Right,Anterior Lower Leg o Change dressing every day. o Change dressing every other day. Wound #3 Left Toe Great o Change dressing every day. o Change dressing every other day. Follow-up Appointments Wound #2 Right,Anterior Lower Leg o Return Appointment in 1 week. SHARIQ, PUIG ([id number]) Wound #3 Left Toe Great o Return Appointment in 1 week. Edema Control Wound #2 Right,Anterior Lower Leg o Kerlix and Coban - Right Lower Extremity Radiology o X-ray, foot - Left 3 view with concentration on great toe Services and Therapies o Arterial Studies- Bilateral o Ankle Brachial Index (ABI) Electronic Signature(s) Signed: 08/13/2018 5:56:52 AM By: Linton Ham MD Signed: 08/13/2018 3:05:38 PM By: Gretta Cool, BSN, RN, CWS, Kim RN, BSN Entered By: Gretta Cool, BSN, RN, CWS, Kim on 08/12/2018 14:41:51 [name], [name] ([id number]) -------------------------------------------------------------------------------- Problem List Details Patient Name: [name], [name] Date of Service: 08/12/2018 8:45 AM Medical Record Number: [id number] Patient Account Number: [id number] Date of Birth/Sex: 1945-12-07 (72 y.o. M) Treating RN: Cornell Barman Primary Care Provider: [name] Other Clinician: Referring Provider: [name] Treating Provider/Extender: Tito Dine in Treatment: 0 Active Problems ICD-10 Evaluated Encounter Code Description Active Date Today Diagnosis E10.621 Type 1 diabetes mellitus with foot ulcer 08/12/2018 No Yes L97.523 Non-pressure chronic ulcer of other part of left foot with 08/12/2018 No Yes necrosis of muscle L97.211 Non-pressure chronic ulcer of right calf limited to breakdown  08/12/2018 No Yes of skin E10.51 Type 1 diabetes mellitus with diabetic peripheral angiopathy 08/12/2018 No Yes without gangrene Inactive Problems Resolved Problems Electronic Signature(s) Signed: 08/13/2018 5:56:52 AM By: Linton Ham MD Entered By: Linton Ham on 08/12/2018 10:27:23 [name], [name] ([id number]) -------------------------------------------------------------------------------- Progress Note Details Patient Name: [name], [name] Date of Service: 08/12/2018 8:45 AM Medical Record Number: [id number] Patient Account Number: [id number] Date of Birth/Sex: [birth year] (72 y.o. M) Treating RN: Cornell Barman Primary Care Provider: [name] Other Clinician: Referring Provider: [name] Treating Provider/Extender: Tito Dine in Treatment: 0 Subjective Chief Complaint Information obtained from Patient Patients presents for treatment of an open diabetic ulcer the right lower extremity for about 6 months 08/12/18; patient returns to clinic today for review of wounds on his right calf. He also has a wound on his left great toe that apparently has been followed by his podiatrist Dr. Caryl Comes History of Present Illness (HPI) The following HPI elements were documented for the patient's wound: Location: right anterior shin Quality: Patient reports No Pain. Severity: Patient states wound are getting worse. Duration: Patient has had the wound for > 6 months prior to seeking treatment at the wound center Context: The wound appeared gradually over time Modifying Factors: Other treatment(s) tried include: taken doxycycline and use bacitracin Associated Signs and Symptoms: Patient reports presence of swelling 72 year old male recently seen by his PCP Dr. [name] who saw him for a wound on the right leg which is less tender and he is continuing to use bacitracin and has completed her course of doxycycline. Past medical history of coronary  artery disease, diabetes mellitus type 1, hyperlipidemia, hypertension, MI, plantar fascial fibromatosis, pneumonia, status post foot surgery due to trauma on the left side, coronary angioplasty. he is a former smoker and quit in July 1996. last hemoglobin A1c was 7.8 % in April of this year 5/22 2017 -- biopsy of the right lower extremity was sent for pathology and the report is that of a basal cell carcinoma with  edges of the biopsy are involved. I have called the patient over the phone( 03/28/16) and discussed the pathology report with him and he is agreeable to see a surgeon for excision and need full. I have also spoken personally to Dr. [name], of [facility] surgical and referred the patient for further wide excision of this lesion and have communicated this to the patient. READMISSION 08/12/18 This is a now 72 year old man with type 1 diabetes. Hemoglobin A1c on 4/29 was 7.9. He is listed as having a history of PAD in the [place] records although the patient doesn't recall this, doesn't complain of any symptoms of claudication and doesn't believe he has had any prior noninvasive arterial test. He is a nonsmoker. He was here in 2017 with a wound on his Right lower leg. This was biopsied in the clinic and pathology showed a basal cell carcinoma. He went on to have surgery on this area this is currently where I believe his current wounds are located. He generally bumped the lateral part of his right calf 2 weeks ago and has a superficial abrasion. He also has 2 small open areas on the medial part of the tibia in the same area. Our intake nurse noted a stitch coming out of this which was removed. These of the wound sees actually come to the clinic for. However the more concerning area is an area on the tip of the left great toe. He says that this was initially traumatic and he's been to see Dr. [name] podiatrist. Darral Dash been using what I think is Santyl to the wound tip. This has some  depth. ABIs in this clinic were non-obtainable on the right and 1.84 on the left. MOHAMADOU, MACIVER ([id number]) Wound History Patient presents with 2 open wounds that have been present for approximately 3 weeks. Patient has been treating wounds in the following manner: unknown. Laboratory tests have not been performed in the last month. Patient reportedly has not tested positive for an antibiotic resistant organism. Patient reportedly has not tested positive for osteomyelitis. Patient reportedly has not had testing performed to evaluate circulation in the legs. Patient experiences the following problems associated with their wounds: swelling. Patient History Information obtained from Patient. Allergies No Known Drug Intolerances Family History Heart Disease - Mother,Father, No family history of Cancer, Diabetes, Hereditary Spherocytosis, Hypertension, Kidney Disease, Lung Disease, Seizures, Stroke, Thyroid Problems, Tuberculosis. Social History Former smoker - 30 years ago, Marital Status - Single, Alcohol Use - Never, Drug Use - No History, Caffeine Use - Moderate. Medical History Ear/Nose/Mouth/Throat Denies history of Middle ear problems Cardiovascular Patient has history of Coronary Artery Disease Endocrine Patient has history of Type I Diabetes Denies history of Type II Diabetes Oncologic Patient has history of Received Chemotherapy Patient is treated with Insulin. Blood sugar is tested. Blood sugar results noted at the following times: Breakfast - 120. Medical And Surgical History Notes Constitutional Symptoms (General Health) Stent in heart (2009); Wound on left foot (treated by Cleda Mccreedy) stepped on rock; A1C 7.8; Cardiovascular HLD Oncologic bladder cancer Review of Systems (ROS) Eyes Denies complaints or symptoms of Dry Eyes, Vision Changes, Glasses / Contacts. Ear/Nose/Mouth/Throat Denies complaints or symptoms of Difficult clearing ears,  Sinusitis. Hematologic/Lymphatic Denies complaints or symptoms of Bleeding / Clotting Disorders, Human Immunodeficiency Virus. Respiratory Denies complaints or symptoms of Chronic or frequent coughs, Shortness of Breath. Cardiovascular Complains or has symptoms of LE edema. Denies complaints or symptoms of Chest pain. Gastrointestinal Denies complaints or symptoms of Frequent diarrhea, Nausea,  Vomiting. Endocrine Renderos, Geoffry H. ([id number]) Denies complaints or symptoms of Hepatitis, Thyroid disease, Polydypsia (Excessive Thirst). Genitourinary Denies complaints or symptoms of Kidney failure/ Dialysis, Incontinence/dribbling. Immunological Denies complaints or symptoms of Hives, Itching. Integumentary (Skin) Complains or has symptoms of Wounds. Denies complaints or symptoms of Bleeding or bruising tendency, Breakdown, Swelling. Musculoskeletal Denies complaints or symptoms of Muscle Pain, Muscle Weakness. Neurologic Denies complaints or symptoms of Numbness/parasthesias, Focal/Weakness. Psychiatric Denies complaints or symptoms of Anxiety, Claustrophobia. Objective Constitutional Sitting or standing Blood Pressure is within target range for patient.. Pulse regular and within target range for patient.Marland Kitchen Respirations regular, non-labored and within target range.. Temperature is normal and within the target range for the patient.Marland Kitchen appears in no distress. Vitals Time Taken: 8:53 AM, Height: 69 in, Source: Stated, Weight: 183.2 lbs, Source: Measured, BMI: 27.1, Temperature: 97.7 F, Pulse: 67 bpm, Respiratory Rate: 16 breaths/min, Blood Pressure: 139/67 mmHg. Eyes Conjunctivae clear. No discharge. Respiratory Respiratory effort is easy and symmetric bilaterally. Rate is normal at rest and on room air.. Cardiovascular Femoral arteries without bruits and pulses strong.. Palpable on the left but I think reduced versus the right. Edema present in both extremities. This is  pitting. Lymphatic None palpable in the popliteal area bilaterally. Musculoskeletal Traumatic scar on the left dorsal ankle apparently from remote trauma. Neurological Sensation normal to touch, pin, and vibration. Sensation intact to 10 gram monofilament in extremities.Marland Kitchen Psychiatric No evidence of depression, anxiety, or agitation. Calm, cooperative, and communicative. Appropriate interactions and affect.. General Notes: Wound exam; On the tip of his left great toe 5 folks a small probing wound. This is not coated bone but has roughly 2 mm to 3 mm in depth this was apparently traumatic. He also has some erythema coming down from the tip of the Eveland, Tiernan H. ([id number]) toe but without pain. Patient's sensation was intact On the right lateral lower tibial area a small abrasion-looking area. There is too punched out areas on the right medial lower tibial area. Our intake nurse remove the suture here and this could be the issue. The patient thinks that this is where his previous basal cell carcinoma was Integumentary (Hair, Skin) Nothing concerning the patient's current wounds looks like a malignancy. Wound #2 status is Open. Original cause of wound was Gradually Appeared. The wound is located on the Right,Anterior Lower Leg. The wound measures 0.3cm length x 0.3cm width x 0.1cm depth; 0.071cm^2 area and 0.007cm^3 volume. There is no tunneling or undermining noted. There is a medium amount of serous drainage noted. The wound margin is flat and intact. There is no granulation within the wound bed. There is a large (67-100%) amount of necrotic tissue within the wound bed including Eschar. The periwound skin appearance exhibited: Erythema. The periwound skin appearance did not exhibit: Callus, Crepitus, Excoriation, Induration, Rash, Scarring, Dry/Scaly, Maceration, Atrophie Blanche, Cyanosis, Ecchymosis, Hemosiderin Staining, Mottled, Pallor, Rubor. The surrounding wound skin color is  noted with erythema which is circumferential. Periwound temperature was noted as No Abnormality. Wound #3 status is Open. Original cause of wound was Gradually Appeared. The wound is located on the Left Toe Great. The wound measures 0.4cm length x 0.5cm width x 0.2cm depth; 0.157cm^2 area and 0.031cm^3 volume. There is Fat Layer (Subcutaneous Tissue) Exposed exposed. There is no tunneling or undermining noted. There is a medium amount of serous drainage noted. The wound margin is flat and intact. There is medium (34-66%) pink granulation within the wound bed. There is a medium (34-66%) amount of necrotic tissue  within the wound bed including Adherent Slough. The periwound skin appearance exhibited: Erythema. The periwound skin appearance did not exhibit: Callus, Crepitus, Excoriation, Induration, Rash, Scarring, Dry/Scaly, Maceration, Atrophie Blanche, Cyanosis, Ecchymosis, Hemosiderin Staining, Mottled, Pallor, Rubor. The surrounding wound skin color is noted with erythema which is circumferential. Periwound temperature was noted as No Abnormality. Assessment Active Problems ICD-10 Type 1 diabetes mellitus with foot ulcer Non-pressure chronic ulcer of other part of left foot with necrosis of muscle Non-pressure chronic ulcer of right calf limited to breakdown of skin Type 1 diabetes mellitus with diabetic peripheral angiopathy without gangrene Plan Wound Cleansing: Wound #2 Right,Anterior Lower Leg: Cleanse wound with mild soap and water May shower with protection. Wound #3 Left Toe Great: Cleanse wound with mild soap and water May shower with protection. Anesthetic (add to Medication List): Wound #2 Right,Anterior Lower Leg: Topical Lidocaine 4% cream applied to wound bed prior to debridement (In Clinic Only). Wound #3 Left Toe Great: Topical Lidocaine 4% cream applied to wound bed prior to debridement (In Clinic Only). DEVAUGHN, SAVANT ([id number]) Primary Wound  Dressing: Wound #2 Right,Anterior Lower Leg: Silver Collagen Wound #3 Left Toe Great: Santyl Ointment Secondary Dressing: Wound #2 Right,Anterior Lower Leg: ABD pad Wound #3 Left Toe Great: ABD pad Conform/Kerlix Dressing Change Frequency: Wound #2 Right,Anterior Lower Leg: Change dressing every day. Change dressing every other day. Wound #3 Left Toe Great: Change dressing every day. Change dressing every other day. Follow-up Appointments: Wound #2 Right,Anterior Lower Leg: Return Appointment in 1 week. Wound #3 Left Toe Great: Return Appointment in 1 week. Edema Control: Wound #2 Right,Anterior Lower Leg: Kerlix and Coban - Right Lower Extremity Services and Therapies ordered were: Arterial Studies- Bilateral, Ankle Brachial Index (ABI) #1 we continue with Santyl the left great toe. I really think this needs to be x-rayed and probably needs arterial studies on both legs but specifically the left. I told the patient made little sense to follow this wound and both the wound care center and the podiatry clinic. I'll talk to him about this next week. I think this is actually the more worrisome wound area. #2 on the right leg he has what looks to be a benign abrasion. This does not look malignant. I think this will be gone by next week. He has 2 smaller punched-out cold that look this like this was a suture that simply worked its way out.Her intake nurse reported this. #3 the patient is listed as having PAD in the [facility] health records although I don't see that he is ever had noninvasive arterial tests. Given the noncompressible nature of the tests we do in this clinic I think he needs formal arterial studies including ABIs TBIs and arterial Doppler Electronic Signature(s) Signed: 08/13/2018 5:56:52 AM By: Linton Ham MD Entered By: Linton Ham on 08/12/2018 10:42:26 [name], [name]  ([id number]) -------------------------------------------------------------------------------- ROS/PFSH Details Patient Name: Ahart, Jovany H. Date of Service: 08/12/2018 8:45 AM Medical Record Number: [id number] Patient Account Number: [id number] Date of Birth/Sex: May 12, 1946 (72 y.o. M) Treating RN: Montey Hora Primary Care Provider: [name] Other Clinician: Referring Provider: [name] Treating Provider/Extender: Tito Dine in Treatment: 0 Information Obtained From Patient Wound History Do you currently have one or more open woundso Yes How many open wounds do you currently haveo 2 Approximately how long have you had your woundso 3 weeks How have you been treating your wound(s) until nowo unknown Has your wound(s) ever healed and then re-openedo No Have you had any  lab work done in the past montho No Have you tested positive for an antibiotic resistant organism (MRSA, VRE)o No Have you tested positive for osteomyelitis (bone infection)o No Have you had any tests for circulation on your legso No Have you had other problems associated with your woundso Swelling Eyes Complaints and Symptoms: Negative for: Dry Eyes; Vision Changes; Glasses / Contacts Medical History: Negative for: Cataracts; Glaucoma; Optic Neuritis Ear/Nose/Mouth/Throat Complaints and Symptoms: Negative for: Difficult clearing ears; Sinusitis Medical History: Negative for: Chronic sinus problems/congestion; Middle ear problems Hematologic/Lymphatic Complaints and Symptoms: Negative for: Bleeding / Clotting Disorders; Human Immunodeficiency Virus Medical History: Negative for: Anemia; Hemophilia; Human Immunodeficiency Virus; Lymphedema; Sickle Cell Disease Respiratory Complaints and Symptoms: Negative for: Chronic or frequent coughs; Shortness of Breath Medical History: Negative for: Aspiration; Asthma; Chronic Obstructive Pulmonary Disease (COPD); Pneumothorax; Sleep  Apnea; Tuberculosis Cardiovascular Krahenbuhl, Keonta H. ([id number]) Complaints and Symptoms: Positive for: LE edema Negative for: Chest pain Medical History: Positive for: Coronary Artery Disease; Hypertension - medication controlled Negative for: Angina; Arrhythmia; Congestive Heart Failure; Deep Vein Thrombosis; Hypotension; Myocardial Infarction; Peripheral Arterial Disease; Peripheral Venous Disease; Phlebitis; Vasculitis Past Medical History Notes: HLD Gastrointestinal Complaints and Symptoms: Negative for: Frequent diarrhea; Nausea; Vomiting Medical History: Negative for: Cirrhosis ; Colitis; Crohnos; Hepatitis A; Hepatitis B; Hepatitis C Endocrine Complaints and Symptoms: Negative for: Hepatitis; Thyroid disease; Polydypsia (Excessive Thirst) Medical History: Positive for: Type I Diabetes Negative for: Type II Diabetes Time with diabetes: 77 years Treated with: Insulin Blood sugar tested every day: Yes Tested : 5 times daily Blood sugar testing results: Breakfast: 120 Genitourinary Complaints and Symptoms: Negative for: Kidney failure/ Dialysis; Incontinence/dribbling Medical History: Negative for: End Stage Renal Disease Immunological Complaints and Symptoms: Negative for: Hives; Itching Medical History: Negative for: Lupus Erythematosus; Raynaudos; Scleroderma Integumentary (Skin) Complaints and Symptoms: Positive for: Wounds Negative for: Bleeding or bruising tendency; Breakdown; Swelling Medical History: Negative for: History of Burn; History of pressure wounds Mcphie, Clee H. ([id number]) Musculoskeletal Complaints and Symptoms: Negative for: Muscle Pain; Muscle Weakness Medical History: Positive for: Osteoarthritis - knee Negative for: Gout; Rheumatoid Arthritis; Osteomyelitis Neurologic Complaints and Symptoms: Negative for: Numbness/parasthesias; Focal/Weakness Medical History: Negative for: Dementia; Neuropathy; Quadriplegia;  Paraplegia; Seizure Disorder Psychiatric Complaints and Symptoms: Negative for: Anxiety; Claustrophobia Medical History: Negative for: Anorexia/bulimia; Confinement Anxiety Constitutional Symptoms (General Health) Medical History: Past Medical History Notes: Stent in heart (2009); Wound on left foot (treated by Cleda Mccreedy) stepped on rock; A1C 7.8; Oncologic Medical History: Positive for: Received Chemotherapy Negative for: Received Radiation Past Medical History Notes: bladder cancer Immunizations Pneumococcal Vaccine: Received Pneumococcal Vaccination: Yes Implantable Devices Family and Social History Cancer: No; Diabetes: No; Heart Disease: Yes - Mother,Father; Hereditary Spherocytosis: No; Hypertension: No; Kidney Disease: No; Lung Disease: No; Seizures: No; Stroke: No; Thyroid Problems: No; Tuberculosis: No; Former smoker - 30 years ago; Marital Status - Single; Alcohol Use: Never; Drug Use: No History; Caffeine Use: Moderate; Advanced Directives: Yes (Not Provided); Patient does not want information on Advanced Directives; Living Will: Yes (Not Provided) Electronic Signature(s) Signed: 08/12/2018 5:06:12 PM By: Montey Hora Signed: 08/13/2018 5:56:52 AM By: Linton Ham MD Entered By: Montey Hora on 08/12/2018 09:10:41 [name], [name] ([id number]) -------------------------------------------------------------------------------- SuperBill Details Patient Name: Vahey, Garald H. Date of Service: 08/12/2018 Medical Record Number: [id number] Patient Account Number: [id number] Date of Birth/Sex: 03/28/1946 (72 y.o. M) Treating RN: Cornell Barman Primary Care Provider: [name] Other Clinician: Referring Provider: [name] Treating Provider/Extender: Tito Dine in Treatment: 0 Diagnosis Coding ICD-10 Codes  Code Description E10.621 Type 1 diabetes mellitus with foot ulcer L97.523 Non-pressure chronic ulcer of other part of left foot with  necrosis of muscle L97.211 Non-pressure chronic ulcer of right calf limited to breakdown of skin E10.51 Type 1 diabetes mellitus with diabetic peripheral angiopathy without gangrene Facility Procedures CPT4 Code: [id number] Description: [id number] - WOUND CARE VISIT-LEV 5 EST PT Modifier: Quantity: 1 Physician Procedures CPT4 Code: [id number] Description: 89381 - WC PHYS LEVEL 4 - EST PT ICD-10 Diagnosis Description E10.621 Type 1 diabetes mellitus with foot ulcer L97.523 Non-pressure chronic ulcer of other part of left foot with necr L97.211 Non-pressure chronic ulcer of right calf limited  to breakdown o Modifier: osis of muscle f skin Quantity: 1 Electronic Signature(s) Signed: 08/13/2018 5:56:52 AM By: Linton Ham MD Entered By: Linton Ham on 08/12/2018 10:43:02

## 2018-08-17 ENCOUNTER — Ambulatory Visit
Admission: RE | Admit: 2018-08-17 | Discharge: 2018-08-17 | Disposition: A | Discharge status: Home / Self Care | Payer: Medicare HMO | Source: Ambulatory Visit | Attending: Internal Medicine | Admitting: Internal Medicine

## 2018-08-17 DIAGNOSIS — X58XXXA Exposure to other specified factors, initial encounter: Secondary | ICD-10-CM | POA: Insufficient documentation

## 2018-08-17 DIAGNOSIS — S81809A Unspecified open wound, unspecified lower leg, initial encounter: Secondary | ICD-10-CM

## 2018-08-17 DIAGNOSIS — M19072 Primary osteoarthritis, left ankle and foot: Secondary | ICD-10-CM | POA: Insufficient documentation

## 2018-08-17 DIAGNOSIS — S91102A Unspecified open wound of left great toe without damage to nail, initial encounter: Secondary | ICD-10-CM | POA: Insufficient documentation

## 2018-08-18 ENCOUNTER — Other Ambulatory Visit: Payer: Self-pay | Admitting: Internal Medicine

## 2018-08-18 DIAGNOSIS — T8189XA Other complications of procedures, not elsewhere classified, initial encounter: Secondary | ICD-10-CM

## 2018-08-19 ENCOUNTER — Ambulatory Visit
Admission: RE | Admit: 2018-08-19 | Discharge: 2018-08-19 | Disposition: A | Discharge status: Home / Self Care | Payer: Medicare HMO | Source: Ambulatory Visit | Attending: Internal Medicine | Admitting: Internal Medicine

## 2018-08-19 ENCOUNTER — Ambulatory Visit: Admission: RE | Admit: 2018-08-19 | Payer: Medicare HMO | Source: Ambulatory Visit

## 2018-08-19 ENCOUNTER — Encounter: Payer: Medicare HMO | Admitting: Internal Medicine

## 2018-08-19 DIAGNOSIS — T8189XA Other complications of procedures, not elsewhere classified, initial encounter: Secondary | ICD-10-CM | POA: Diagnosis not present

## 2018-08-19 DIAGNOSIS — E1051 Type 1 diabetes mellitus with diabetic peripheral angiopathy without gangrene: Secondary | ICD-10-CM | POA: Diagnosis not present

## 2018-08-19 DIAGNOSIS — S81802A Unspecified open wound, left lower leg, initial encounter: Secondary | ICD-10-CM | POA: Diagnosis not present

## 2018-08-19 DIAGNOSIS — E10621 Type 1 diabetes mellitus with foot ulcer: Secondary | ICD-10-CM | POA: Diagnosis not present

## 2018-08-19 DIAGNOSIS — I252 Old myocardial infarction: Secondary | ICD-10-CM | POA: Diagnosis not present

## 2018-08-19 DIAGNOSIS — Y838 Other surgical procedures as the cause of abnormal reaction of the patient, or of later complication, without mention of misadventure at the time of the procedure: Secondary | ICD-10-CM | POA: Insufficient documentation

## 2018-08-19 DIAGNOSIS — E10622 Type 1 diabetes mellitus with other skin ulcer: Secondary | ICD-10-CM | POA: Diagnosis not present

## 2018-08-19 DIAGNOSIS — E1059 Type 1 diabetes mellitus with other circulatory complications: Secondary | ICD-10-CM | POA: Diagnosis not present

## 2018-08-19 DIAGNOSIS — L97523 Non-pressure chronic ulcer of other part of left foot with necrosis of muscle: Secondary | ICD-10-CM | POA: Diagnosis not present

## 2018-08-19 DIAGNOSIS — S81801A Unspecified open wound, right lower leg, initial encounter: Secondary | ICD-10-CM | POA: Diagnosis not present

## 2018-08-19 DIAGNOSIS — S91102A Unspecified open wound of left great toe without damage to nail, initial encounter: Secondary | ICD-10-CM | POA: Diagnosis not present

## 2018-08-19 DIAGNOSIS — L97211 Non-pressure chronic ulcer of right calf limited to breakdown of skin: Secondary | ICD-10-CM | POA: Diagnosis not present

## 2018-08-19 DIAGNOSIS — M86672 Other chronic osteomyelitis, left ankle and foot: Secondary | ICD-10-CM | POA: Diagnosis not present

## 2018-08-19 DIAGNOSIS — I70203 Unspecified atherosclerosis of native arteries of extremities, bilateral legs: Secondary | ICD-10-CM | POA: Insufficient documentation

## 2018-08-19 DIAGNOSIS — E1069 Type 1 diabetes mellitus with other specified complication: Secondary | ICD-10-CM | POA: Diagnosis not present

## 2018-08-22 NOTE — Progress Notes (Signed)
COBIE, MARCOUX ([id number]) Visit Report for 08/19/2018 HPI Details Patient Name: [name], [name] Date of Service: 08/19/2018 2:00 PM Medical Record Number: [id number] Patient Account Number: [id number] Date of Birth/Sex: 08-22-1946 (72 y.o. M) Treating RN: Cornell Barman Primary Care Provider: [name] Other Clinician: Referring Provider: [name] Treating Provider/Extender: Tito Dine in Treatment: 1 History of Present Illness Location: right anterior shin Quality: Patient reports No Pain. Severity: Patient states wound are getting worse. Duration: Patient has had the wound for > 6 months prior to seeking treatment at the wound center Context: The wound appeared gradually over time Modifying Factors: Other treatment(s) tried include: taken doxycycline and use bacitracin Associated Signs and Symptoms: Patient reports presence of swelling HPI Description: 72 year old male recently seen by his PCP Dr. [name] who saw him for a wound on the right leg which is less tender and he is continuing to use bacitracin and has completed her course of doxycycline. Past medical history of coronary artery disease, diabetes mellitus type 1, hyperlipidemia, hypertension, MI, plantar fascial fibromatosis, pneumonia, status post foot surgery due to trauma on the left side, coronary angioplasty. he is a former smoker and quit in July 1996. last hemoglobin A1c was 7.8 % in April of this year 5/22 2017 -- biopsy of the right lower extremity was sent for pathology and the report is that of a basal cell carcinoma with edges of the biopsy are involved. I have called the patient over the phone( 03/28/16) and discussed the pathology report with him and he is agreeable to see a surgeon for excision and need full. I have also spoken personally to Dr. [name], of [facility] surgical and referred the patient for further wide excision of this lesion and have communicated this to the  patient. READMISSION 08/12/18 This is a now 72 year old man with type 1 diabetes. Hemoglobin A1c on 4/29 was 7.9. He is listed as having a history of PAD in the  records although the patient doesn't recall this, doesn't complain of any symptoms of claudication and doesn't believe he has had any prior noninvasive arterial test. He is a nonsmoker. He was here in 2017 with a wound on his Right lower leg. This was biopsied in the clinic and pathology showed a basal cell carcinoma. He went on to have surgery on this area this is currently where I believe his current wounds are located. He generally bumped the lateral part of his right calf 2 weeks ago and has a superficial abrasion. He also has 2 small open areas on the medial part of the tibia in the same area. Our intake nurse noted a stitch coming out of this which was removed. These of the wound sees actually come to the clinic for. However the more concerning area is an area on the tip of the left great toe. He says that this was initially traumatic and he's been to see Dr. [name] podiatrist. Darral Dash been using what I think is Santyl to the wound tip. This has some depth. ABIs in this clinic were non-obtainable on the right and 1.84 on the left. 08/19/18; the patient had arterial studies that did not show evidence of significant hemodynamically compromising occlusions in either leg. There was mild medial atherosclerosis bilaterally. His resting ABIs were within the normal limits at 1.3 on the right and 1.4 on the left. There was normal posterior and anterior tibial artery waveforms and velocities listed on both sides. The patient arrives with what appears to be a  healthy surface over the tip of his left great toe. This is indeed surprising. Still looks somewhat friable however. More concerning is the x-ray that we did that showed erosions of the tuft of the distal Hinch, Antion H. ([id number]) phalanx the left great toe consistent  with osteomyelitis. I attempted to pull this x-ray up on [facility] however I can't open the film to look at this myself. Electronic Signature(s) Signed: 08/19/2018 6:14:59 PM By: Linton Ham MD Entered By: Linton Ham on 08/19/2018 14:40:56 Cornell, [name] ([id number]) -------------------------------------------------------------------------------- Physical Exam Details Patient Name: Walz, Jaeceon H. Date of Service: 08/19/2018 2:00 PM Medical Record Number: [id number] Patient Account Number: [id number] Date of Birth/Sex: 03-20-1946 (72 y.o. M) Treating RN: Cornell Barman Primary Care Provider: [name] Other Clinician: Referring Provider: [name] Treating Provider/Extender: Tito Dine in Treatment: 1 Constitutional Sitting or standing Blood Pressure is within target range for patient.. Pulse regular and within target range for patient.Marland Kitchen Respirations regular, non-labored and within target range.. Temperature is normal and within the target range for the patient.Marland Kitchen appears in no distress. Respiratory Bilateral breath sounds are clear and equal in all lobes with no wheezes, rales or rhonchi.. Cardiovascular Pedal pulses absent bilaterally.. Lymphatic non palpable in the popliteal area bilaterally. Integumentary (Hair, Skin) No primary skin issues are seen. Psychiatric No evidence of depression, anxiety, or agitation. Calm, cooperative, and communicative. Appropriate interactions and affect.. Notes Would exam oThe area on the tip of the toe is epithelialized. Still looks somewhat fragile but there is actually not an open wound. Somewhat surprising development given 2-3 mm of depth last time. There is no surrounding erythema. No tenderness of the interphalangeal joint oThe small excoriation on the right anterior lower tibia is also closed. Electronic Signature(s) Signed: 08/19/2018 6:14:59 PM By: Linton Ham MD Entered By: Linton Ham on 08/19/2018 14:43:22 [place], [name] ([id number]) -------------------------------------------------------------------------------- Physician Orders Details Patient Name: Carie, Jamarea H. Date of Service: 08/19/2018 2:00 PM Medical Record Number: [id number] Patient Account Number: [id number] Date of Birth/Sex: May 25, 1946 (72 y.o. M) Treating RN: Cornell Barman Primary Care Provider: [name] Other Clinician: Referring Provider: [name] Treating Provider/Extender: Tito Dine in Treatment: 1 Verbal / Phone Orders: No Diagnosis Coding Wound Cleansing Wound #3 Left Toe Great o Cleanse wound with mild soap and water o May shower with protection. Anesthetic (add to Medication List) Wound #3 Left Toe Great o Topical Lidocaine 4% cream applied to wound bed prior to debridement (In Clinic Only). Primary Wound Dressing Wound #3 Left Toe Great o Silver Collagen Secondary Dressing Wound #3 Left Toe Great o Gauze and Kerlix/Conform Dressing Change Frequency Wound #3 Left Toe Great o Change dressing every other day. Follow-up Appointments Wound #3 Left Toe Great o Return Appointment in 1 week. Medications-please add to medication list. Wound #3 Left Toe Great o P.O. Antibiotics Consults o Infectious Disease - Osteomyeitis in Left great toe Patient Medications Allergies: No Known Drug Intolerances Notifications Medication Indication Start End Levaquin osteomyelitis 08/19/2018 DOSE oral 500 mg tablet - 1 tablet oral qd for 2 weeks metronidazole osteomyelitis 08/19/2018 DOSE oral 500 mg tablet - 1 tablet oral tid for 2 weeks JIRO, KIESTER ([id number]) Electronic Signature(s) Signed: 08/19/2018 2:55:09 PM By: Linton Ham MD Entered By: Linton Ham on 08/19/2018 14:55:08 [place], [name] ([id number]) -------------------------------------------------------------------------------- Problem List Details Patient  Name: Parada, Jamorion H. Date of Service: 08/19/2018 2:00 PM Medical Record Number: [id number] Patient Account Number: [id number] Date of Birth/Sex: 23-Apr-1946 (72 y.o.  M) Treating RN: Cornell Barman Primary Care Provider: [name] Other Clinician: Referring Provider: [name] Treating Provider/Extender: Tito Dine in Treatment: 1 Active Problems ICD-10 Evaluated Encounter Code Description Active Date Today Diagnosis E10.621 Type 1 diabetes mellitus with foot ulcer 08/12/2018 No Yes L97.523 Non-pressure chronic ulcer of other part of left foot with 08/12/2018 No Yes necrosis of muscle L97.211 Non-pressure chronic ulcer of right calf limited to breakdown 08/12/2018 No Yes of skin E10.51 Type 1 diabetes mellitus with diabetic peripheral angiopathy 08/12/2018 No Yes without gangrene G25.427 Other chronic osteomyelitis, left ankle and foot 08/19/2018 No Yes Inactive Problems Resolved Problems Electronic Signature(s) Signed: 08/19/2018 6:14:59 PM By: Linton Ham MD Entered By: Linton Ham on 08/19/2018 14:47:33 [place], [name] ([id number]) -------------------------------------------------------------------------------- Progress Note Details Patient Name: Lias, Cylus H. Date of Service: 08/19/2018 2:00 PM Medical Record Number: [id number] Patient Account Number: [id number] Date of Birth/Sex: August 10, 1946 (72 y.o. M) Treating RN: Cornell Barman Primary Care Provider: [name] Other Clinician: Referring Provider: [name] Treating Provider/Extender: Tito Dine in Treatment: 1 Subjective History of Present Illness (HPI) The following HPI elements were documented for the patient's wound: Location: right anterior shin Quality: Patient reports No Pain. Severity: Patient states wound are getting worse. Duration: Patient has had the wound for > 6 months prior to seeking treatment at the wound center Context: The wound appeared  gradually over time Modifying Factors: Other treatment(s) tried include: taken doxycycline and use bacitracin Associated Signs and Symptoms: Patient reports presence of swelling 72 year old male recently seen by his PCP Dr. [name] who saw him for a wound on the right leg which is less tender and he is continuing to use bacitracin and has completed her course of doxycycline. Past medical history of coronary artery disease, diabetes mellitus type 1, hyperlipidemia, hypertension, MI, plantar fascial fibromatosis, pneumonia, status post foot surgery due to trauma on the left side, coronary angioplasty. he is a former smoker and quit in July 1996. last hemoglobin A1c was 7.8 % in April of this year 5/22 2017 -- biopsy of the right lower extremity was sent for pathology and the report is that of a basal cell carcinoma with edges of the biopsy are involved. I have called the patient over the phone( 03/28/16) and discussed the pathology report with him and he is agreeable to see a surgeon for excision and need full. I have also spoken personally to Dr. [name], of [facility] surgical and referred the patient for further wide excision of this lesion and have communicated this to the patient. READMISSION 08/12/18 This is a now 72 year old man with type 1 diabetes. Hemoglobin A1c on 4/29 was 7.9. He is listed as having a history of PAD in the [facility] records although the patient doesn't recall this, doesn't complain of any symptoms of claudication and doesn't believe he has had any prior noninvasive arterial test. He is a nonsmoker. He was here in 2017 with a wound on his Right lower leg. This was biopsied in the clinic and pathology showed a basal cell carcinoma. He went on to have surgery on this area this is currently where I believe his current wounds are located. He generally bumped the lateral part of his right calf 2 weeks ago and has a superficial abrasion. He also has 2 small open areas on the  medial part of the tibia in the same area. Our intake nurse noted a stitch coming out of this which was removed. These of the wound sees  actually come to the clinic for. However the more concerning area is an area on the tip of the left great toe. He says that this was initially traumatic and he's been to see Dr. [name] podiatrist. Darral Dash been using what I think is Santyl to the wound tip. This has some depth. ABIs in this clinic were non-obtainable on the right and 1.84 on the left. 08/19/18; the patient had arterial studies that did not show evidence of significant hemodynamically compromising occlusions in either leg. There was mild medial atherosclerosis bilaterally. His resting ABIs were within the normal limits at 1.3 on the right and 1.4 on the left. There was normal posterior and anterior tibial artery waveforms and velocities listed on both sides. The patient arrives with what appears to be a healthy surface over the tip of his left great toe. This is indeed surprising. Still looks somewhat friable however. More concerning is the x-ray that we did that showed erosions of the tuft of the distal Faiola, Tailor H. ([id number]) phalanx the left great toe consistent with osteomyelitis. I attempted to pull this x-ray up on [facility] however I can't open the film to look at this myself. Objective Constitutional Sitting or standing Blood Pressure is within target range for patient.. Pulse regular and within target range for patient.Marland Kitchen Respirations regular, non-labored and within target range.. Temperature is normal and within the target range for the patient.Marland Kitchen appears in no distress. Vitals Time Taken: 1:58 PM, Height: 69 in, Weight: 183.2 lbs, BMI: 27.1, Temperature: 97.8 F, Pulse: 71 bpm, Respiratory Rate: 16 breaths/min, Blood Pressure: 131/61 mmHg. Respiratory Bilateral breath sounds are clear and equal in all lobes with no wheezes, rales or rhonchi.. Cardiovascular Pedal  pulses absent bilaterally.. Lymphatic non palpable in the popliteal area bilaterally. Psychiatric No evidence of depression, anxiety, or agitation. Calm, cooperative, and communicative. Appropriate interactions and affect.. General Notes: Would exam The area on the tip of the toe is epithelialized. Still looks somewhat fragile but there is actually not an open wound. Somewhat surprising development given 2-3 mm of depth last time. There is no surrounding erythema. No tenderness of the interphalangeal joint The small excoriation on the right anterior lower tibia is also closed. Integumentary (Hair, Skin) No primary skin issues are seen. Wound #2 status is Healed - Epithelialized. Original cause of wound was Gradually Appeared. The wound is located on the Right,Anterior Lower Leg. The wound measures 0cm length x 0cm width x 0cm depth; 0cm^2 area and 0cm^3 volume. There is no tunneling or undermining noted. There is a none present amount of drainage noted. The wound margin is flat and intact. There is no granulation within the wound bed. There is no necrotic tissue within the wound bed. The periwound skin appearance did not exhibit: Callus, Crepitus, Excoriation, Induration, Rash, Scarring, Dry/Scaly, Maceration, Atrophie Blanche, Cyanosis, Ecchymosis, Hemosiderin Staining, Mottled, Pallor, Rubor, Erythema. Periwound temperature was noted as No Abnormality. Wound #3 status is Open. Original cause of wound was Gradually Appeared. The wound is located on the Left Toe Great. The wound measures 0.1cm length x 0.1cm width x 0.1cm depth; 0.008cm^2 area and 0.001cm^3 volume. There is Fat Layer (Subcutaneous Tissue) Exposed exposed. There is no tunneling or undermining noted. There is a medium amount of serous drainage noted. The wound margin is flat and intact. There is no granulation within the wound bed. There is no necrotic tissue within the wound bed. The periwound skin appearance did not exhibit:  Callus, Crepitus, Excoriation, Induration, Rash, Scarring, Dry/Scaly,  Maceration, Atrophie Blanche, Cyanosis, Ecchymosis, Hemosiderin Staining, Mottled, Pallor, Rubor, Erythema. Periwound temperature was noted as No Abnormality. JERZY, ROEPKE ([id number]) Assessment Active Problems ICD-10 Type 1 diabetes mellitus with foot ulcer Non-pressure chronic ulcer of other part of left foot with necrosis of muscle Non-pressure chronic ulcer of right calf limited to breakdown of skin Type 1 diabetes mellitus with diabetic peripheral angiopathy without gangrene Other chronic osteomyelitis, left ankle and foot Plan Wound Cleansing: Wound #3 Left Toe Great: Cleanse wound with mild soap and water May shower with protection. Anesthetic (add to Medication List): Wound #3 Left Toe Great: Topical Lidocaine 4% cream applied to wound bed prior to debridement (In Clinic Only). Primary Wound Dressing: Wound #3 Left Toe Great: Silver Collagen Secondary Dressing: Wound #3 Left Toe Great: Gauze and Kerlix/Conform Dressing Change Frequency: Wound #3 Left Toe Great: Change dressing every other day. Follow-up Appointments: Wound #3 Left Toe Great: Return Appointment in 1 week. Medications-please add to medication list.: Wound #3 Left Toe Great: P.O. Antibiotics Consults ordered were: Infectious Disease - Osteomyeitis in Left great toe The following medication(s) was prescribed: Levaquin oral 500 mg tablet 1 tablet oral qd for 2 weeks for osteomyelitis starting 08/19/2018 metronidazole oral 500 mg tablet 1 tablet oral tid for 2 weeks for osteomyelitis starting 08/19/2018 #1 left great toe appears to be epithelialized at the tip. I must admit to a certain surprise about this. #2 x-ray suggested osteomyelitis but I can't seem to pull that up on [place] Link at the moment. #3 he does not have any major macrovascular stenosis up to the level of his ankle. CHONG, WOJDYLA ([id number]) #4  I'm going to initiate treatment for osteomyelitis with oral antibiotics. We'll solicit a consultation from Dr. Delaine Lame of infectious disease. #5 empiric Levaquin and Flagyl both for 2 weeks pending consultation. Pending the Flagyl to be stopped for 2 weeks. I'm going to attempt to give him a prolonged course of Levaquin Electronic Signature(s) Signed: 08/19/2018 2:55:41 PM By: Linton Ham MD Entered By: Linton Ham on 08/19/2018 14:55:41 [place], [name] ([id number]) -------------------------------------------------------------------------------- SuperBill Details Patient Name: Yerger, Broox H. Date of Service: 08/19/2018 Medical Record Number: [id number] Patient Account Number: [id number] Date of Birth/Sex: 01/22/1946 (72 y.o. M) Treating RN: Cornell Barman Primary Care Provider: [name] Other Clinician: Referring Provider: [name] Treating Provider/Extender: Tito Dine in Treatment: 1 Diagnosis Coding ICD-10 Codes Code Description [id number] Type 1 diabetes mellitus with foot ulcer L97.523 Non-pressure chronic ulcer of other part of left foot with necrosis of muscle L97.211 Non-pressure chronic ulcer of right calf limited to breakdown of skin E10.51 Type 1 diabetes mellitus with diabetic peripheral angiopathy without gangrene Facility Procedures CPT4 Code: [id number] Description: [phone] - WOUND CARE VISIT-LEV 2 EST PT Modifier: Quantity: 1 Physician Procedures CPT4 Code: [id number] Description: 99213 - WC PHYS LEVEL 3 - EST PT ICD-10 Diagnosis Description L97.523 Non-pressure chronic ulcer of other part of left foot with necr E10.621 Type 1 diabetes mellitus with foot ulcer Modifier: osis of muscle Quantity: 1 Electronic Signature(s) Signed: 08/19/2018 6:14:59 PM By: Linton Ham MD Entered By: Linton Ham on 08/19/2018 14:45:49

## 2018-08-22 NOTE — Progress Notes (Signed)
[name] ([id number]) Visit Report for 08/19/2018 Arrival Information Details Patient Name: [name], [name] Date of Service: 08/19/2018 2:00 PM Medical Record Number: [id number] Patient Account Number: [id number] Date of Birth/Sex: 23-Mar-1946 (72 y.o. M) Treating RN: [name] Primary Care [name]: [name] Other Clinician: Referring [name]: [name] Treating [name]/Extender: [name]: 1 Visit Information History Since Last Visit Added or deleted any medications: No Patient Arrived: Ambulatory Any new allergies or adverse reactions: No Arrival Time: 13:58 Had a fall or experienced change in No Accompanied By: self activities of daily living that may affect Transfer Assistance: None risk of falls: Patient Identification Verified: Yes Signs or symptoms of abuse/neglect since last visito No Secondary Verification Process Completed: Yes Implantable device outside of the clinic excluding No cellular tissue based products placed in the center since last visit: Has Dressing in Place as Prescribed: Yes Pain Present Now: No Electronic Signature(s) Signed: 08/19/2018 4:51:34 PM By: [name] Entered By: [name] on 08/19/2018 13:58:44 Ade, [name] ([id number]) -------------------------------------------------------------------------------- Clinic Level of Care Assessment Details Patient Name: [name], [name] Date of Service: 08/19/2018 2:00 PM Medical Record Number: [id number] Patient Account Number: [id number] Date of Birth/Sex: May 11, 1946 (72 y.o. M) Treating RN: [name] Primary Care [name]: [name] Other Clinician: Referring [name]: [name] Treating [name]/Extender: [name]: 1 Clinic Level of Care Assessment Items TOOL 4 Quantity Score []  - Use when only an EandM is performed on FOLLOW-UP visit  0 ASSESSMENTS - Nursing Assessment / Reassessment []  - Reassessment of Co-morbidities (includes updates in patient status) 0 X- 1 5 Reassessment of Adherence to [name] Plan ASSESSMENTS - Wound and Skin Assessment / Reassessment X - Simple Wound Assessment / Reassessment - one wound 1 5 []  - 0 Complex Wound Assessment / Reassessment - multiple wounds []  - 0 Dermatologic / Skin Assessment (not related to wound area) ASSESSMENTS - Focused Assessment []  - Circumferential Edema Measurements - multi extremities 0 []  - 0 Nutritional Assessment / Counseling / Intervention []  - 0 Lower Extremity Assessment (monofilament, tuning fork, pulses) []  - 0 Peripheral Arterial Disease Assessment (using hand held doppler) ASSESSMENTS - Ostomy and/or Continence Assessment and Care []  - Incontinence Assessment and Management 0 []  - 0 Ostomy Care Assessment and Management (repouching, etc.) PROCESS - Coordination of Care X - Simple Patient / Family Education for ongoing care 1 15 []  - 0 Complex (extensive) Patient / Family Education for ongoing care []  - 0 Staff obtains Programmer, systems, Records, Test Results / Process Orders []  - 0 Staff telephones HHA, Nursing Homes / Clarify orders / etc []  - 0 Routine Transfer to another Facility (non-emergent condition) []  - 0 Routine Hospital Admission (non-emergent condition) []  - 0 New Admissions / Biomedical engineer / Ordering NPWT, Apligraf, etc. []  - 0 Emergency Hospital Admission (emergent condition) X- 1 10 Simple Discharge Coordination [name], [name] ([id number]) []  - 0 Complex (extensive) Discharge Coordination PROCESS - Special Needs []  - Pediatric / Minor Patient Management 0 []  - 0 Isolation Patient Management []  - 0 Hearing / Language / Visual special needs []  - 0 Assessment of Community assistance (transportation, D/C planning, etc.) []  - 0 Additional assistance / Altered mentation []  - 0 Support Surface(s) Assessment  (bed, cushion, seat, etc.) INTERVENTIONS - Wound Cleansing / Measurement X - Simple Wound Cleansing - one wound 1 5 []  - 0 Complex Wound Cleansing - multiple wounds X- 1 5 Wound Imaging (photographs -  any number of wounds) []  - 0 Wound Tracing (instead of photographs) X- 1 5 Simple Wound Measurement - one wound []  - 0 Complex Wound Measurement - multiple wounds INTERVENTIONS - Wound Dressings []  - Small Wound Dressing one or multiple wounds 0 X- 1 15 Medium Wound Dressing one or multiple wounds []  - 0 Large Wound Dressing one or multiple wounds []  - 0 Application of Medications - topical []  - 0 Application of Medications - injection INTERVENTIONS - Miscellaneous []  - External ear exam 0 []  - 0 Specimen Collection (cultures, biopsies, blood, body fluids, etc.) []  - 0 Specimen(s) / Culture(s) sent or taken to Lab for analysis []  - 0 Patient Transfer (multiple staff / Civil Service fast streamer / Similar devices) []  - 0 Simple Staple / Suture removal (25 or less) []  - 0 Complex Staple / Suture removal (26 or more) []  - 0 Hypo / Hyperglycemic Management (close monitor of Blood Glucose) []  - 0 Ankle / Brachial Index (ABI) - do not check if billed separately X- 1 5 Vital Signs [name], [name] ([id number]) Has the patient been seen at the hospital within the last three years: Yes Total Score: 70 Level Of Care: New/Established - Level 2 Electronic Signature(s) Signed: 08/19/2018 5:34:51 PM By: [name], BSN, RN, CWS, [name] Entered By: [name], BSN, RN, CWS, [name] on 08/19/2018 14:30:02 [place], [name] ([id number]) -------------------------------------------------------------------------------- Encounter Discharge Information Details Patient Name: [name], [name] Date of Service: 08/19/2018 2:00 PM Medical Record Number: [id number] Patient Account Number: [id number] Date of Birth/Sex: December 07, 1945 (72 y.o. M) Treating RN: [name] Primary Care [name]: [name]  Other Clinician: Referring [name]: [name] Treating [name]/Extender: [name]: 1 Encounter Discharge Information Items Discharge Condition: Stable Ambulatory Status: Ambulatory Discharge Destination: Home Transportation: Private Auto Schedule Follow-up Appointment: Yes Clinical Summary of Care: Electronic Signature(s) Signed: 08/19/2018 5:34:51 PM By: [name], BSN, RN, CWS, [name] Entered By: [name], BSN, RN, CWS, [name] on 08/19/2018 14:40:46 [place], [name] ([id number]) -------------------------------------------------------------------------------- Lower Extremity Assessment Details Patient Name: [name], [name] Date of Service: 08/19/2018 2:00 PM Medical Record Number: [id number] Patient Account Number: [id number] Date of Birth/Sex: 1946/05/17 (72 y.o. M) Treating RN: Secundino Ginger Primary Care Latasha Puskas: [name] Other Clinician: Referring Jaymie Misch: [name] Treating Sun Kihn/Extender: [name]: 1 Electronic Signature(s) Signed: 08/19/2018 2:30:40 PM By: Secundino Ginger Entered By: Secundino Ginger on 08/19/2018 14:05:19 [place], [name] ([id number]) -------------------------------------------------------------------------------- Multi Wound Chart Details Patient Name: [name], [name] Date of Service: 08/19/2018 2:00 PM Medical Record Number: [id number] Patient Account Number: [id number] Date of Birth/Sex: [birth year] (72 y.o. M) Treating RN: [name] Primary Care Witney Huie: [name] Other Clinician: Referring Kinzie Wickes: [name] Treating Reza Crymes/Extender: [name]: 1 Vital Signs Height(in): 69 Pulse(bpm): 71 Weight(lbs): 183.2 Blood Pressure(mmHg): 131/61 Body Mass Index(BMI): 27 Temperature(F): 97.8 Respiratory Rate 16 (breaths/min): Photos: [N/A:N/A] Wound Location: Right, Anterior Lower Leg Left Toe Great N/A Wounding Event: Gradually  Appeared Gradually Appeared N/A Primary Etiology: Diabetic Wound/Ulcer of the Diabetic Wound/Ulcer of the N/A Lower Extremity Lower Extremity Comorbid History: Coronary Artery Disease, Coronary Artery Disease, N/A Hypertension, Type I Diabetes, Hypertension, Type I Diabetes, Osteoarthritis, Received Osteoarthritis, Received Chemotherapy Chemotherapy Date Acquired: 07/13/2018 07/20/2018 N/A Weeks of [name]: 1 1 N/A Wound Status: Healed - Epithelialized Open N/A Pending Amputation on No Yes N/A Presentation: Measurements L x W x D 0x0x0 0.1x0.1x0.1 N/A (cm) Area (cm) : 0 0.008 N/A Volume (cm) : 0 0.001 N/A %  Reduction in Area: 100.00% 94.90% N/A % Reduction in Volume: 100.00% 96.80% N/A Classification: Grade 1 Grade 3 N/A Wagner Verification: N/A X-Ray N/A Exudate Amount: None Present Medium N/A Exudate Type: N/A Serous N/A Exudate Color: N/A amber N/A Wound Margin: Flat and Intact Flat and Intact N/A Granulation Amount: None Present (0%) None Present (0%) N/A Necrotic Amount: None Present (0%) None Present (0%) N/A Exposed Structures: Fascia: No Fat Layer (Subcutaneous N/A Fat Layer (Subcutaneous Tissue) Exposed: Yes [name], [name] ([id number]) Tissue) Exposed: No Fascia: No Tendon: No Tendon: No Muscle: No Muscle: No Joint: No Joint: No Bone: No Bone: No Epithelialization: None None N/A Periwound Skin Texture: Excoriation: No Excoriation: No N/A Induration: No Induration: No Callus: No Callus: No Crepitus: No Crepitus: No Rash: No Rash: No Scarring: No Scarring: No Periwound Skin Moisture: Maceration: No Maceration: No N/A Dry/Scaly: No Dry/Scaly: No Periwound Skin Color: Atrophie Blanche: No Atrophie Blanche: No N/A Cyanosis: No Cyanosis: No Ecchymosis: No Ecchymosis: No Erythema: No Erythema: No Hemosiderin Staining: No Hemosiderin Staining: No Mottled: No Mottled: No Pallor: No Pallor: No Rubor: No Rubor: No Temperature: No  Abnormality No Abnormality N/A Tenderness on Palpation: No No N/A Wound Preparation: Ulcer Cleansing: Ulcer Cleansing: N/A Rinsed/Irrigated with Saline Rinsed/Irrigated with Saline Topical Anesthetic Applied: Topical Anesthetic Applied: Other: lidocaine 4% Other: lidocaine 4% [name] Notes Electronic Signature(s) Signed: 08/19/2018 6:14:59 PM By: Linton Ham MD Entered By: Linton Ham on 08/19/2018 14:33:35 [place], [name] ([id number]) -------------------------------------------------------------------------------- Multi-Disciplinary Care Plan Details Patient Name: [name], [name] Date of Service: 08/19/2018 2:00 PM Medical Record Number: [id number] Patient Account Number: [id number] Date of Birth/Sex: 1945/11/25 (72 y.o. M) Treating RN: [name] Primary Care Annalea Alguire: [name] Other Clinician: Referring Decoda Van: [name] Treating Avonte Sensabaugh/Extender: [name]: 1 Active Inactive ` Orientation to the Wound Care Program Nursing Diagnoses: Knowledge deficit related to the wound healing center program Goals: Patient/caregiver will verbalize understanding of the Center Program Date Initiated: 08/12/2018 Target Resolution Date: 09/12/2018 Goal Status: Active Interventions: Provide education on orientation to the wound center Notes: ` Osteomyelitis Nursing Diagnoses: Infection: osteomyelitis Goals: Patient's osteomyelitis will resolve Date Initiated: 08/19/2018 Target Resolution Date: 10/23/2018 Goal Status: Active Interventions: Assess for signs and symptoms of osteomyelitis resolution every visit Notes: ` Venous Leg Ulcer Nursing Diagnoses: Actual venous Insuffiency (use after diagnosis is confirmed) Goals: Patient will maintain optimal edema control Date Initiated: 08/12/2018 Target Resolution Date: 09/12/2018 Goal Status: Active Interventions: [name], [name] ([id number]) Assess  peripheral edema status every visit. Notes: ` Wound/Skin Impairment Nursing Diagnoses: Impaired tissue integrity Goals: Ulcer/skin breakdown will have a volume reduction of 30% by week 4 Date Initiated: 08/12/2018 Target Resolution Date: 09/12/2018 Goal Status: Active Interventions: Assess ulceration(s) every visit [name] Activities: Topical wound management initiated : 08/12/2018 Notes: Electronic Signature(s) Signed: 08/19/2018 5:34:51 PM By: [name], BSN, RN, CWS, [name] Entered By: [name], BSN, RN, CWS, [name] on 08/19/2018 14:25:48 [place], [name] ([id number]) -------------------------------------------------------------------------------- Pain Assessment Details Patient Name: [name], [name] Date of Service: 08/19/2018 2:00 PM Medical Record Number: [id number] Patient Account Number: [id number] Date of Birth/Sex: 03-24-1946 (72 y.o. M) Treating RN: [name] Primary Care Cassadi Purdie: [name] Other Clinician: Referring Ki Luckman: [name] Treating Alwilda Gilland/Extender: [name]: 1 Active Problems Location of Pain Severity and Description of Pain Patient Has Paino No Site Locations Pain Management and Medication Current Pain Management: Electronic Signature(s) Signed: 08/19/2018 4:51:34 PM By: [name] Signed: 08/19/2018 5:34:51  PM By: [name], BSN, RN, CWS, [name] Entered By: [name] on 08/19/2018 13:58:52 [place], [name] ([id number]) -------------------------------------------------------------------------------- Patient/Caregiver Education Details Patient Name: [name]. Date of Service: 08/19/2018 2:00 PM Medical Record Number: [id number] Patient Account Number: [id number] Date of Birth/Gender: [birth year] (72 y.o. M) Treating RN: [name] Primary Care Physician: [name] Other Clinician: Referring Physician: [place] [name] Treating  Physician/Extender: [name]: 1 Education Assessment Education Provided To: Patient Education Topics Provided Infection: Handouts: Infection Prevention and Management Methods: Demonstration, Explain/Verbal Responses: State content correctly Wound/Skin Impairment: Handouts: Caring for Your Ulcer Methods: Demonstration, Explain/Verbal Responses: State content correctly Electronic Signature(s) Signed: 08/19/2018 5:34:51 PM By: [name], BSN, RN, CWS, [name] Entered By: [name], BSN, RN, CWS, [name] on 08/19/2018 14:40:51 [place], [name] ([id number]) -------------------------------------------------------------------------------- Wound Assessment Details Patient Name: [name], [name] Date of Service: 08/19/2018 2:00 PM Medical Record Number: [id number] Patient Account Number: [id number] Date of Birth/Sex: 11/06/1946 (72 y.o. M) Treating RN: [name] Primary Care Lorance Pickeral: [name] Other Clinician: Referring Alinah Sheard: [name] Treating Clifford Coudriet/Extender: [name]: 1 Wound Status Wound Number: 2 Primary Diabetic Wound/Ulcer of the Lower Extremity Etiology: Wound Location: Right, Anterior Lower Leg Wound Healed - Epithelialized Wounding Event: Gradually Appeared Status: Date Acquired: 07/13/2018 Comorbid Coronary Artery Disease, Hypertension, Type I Weeks Of [name]: 1 History: Diabetes, Osteoarthritis, Received Clustered Wound: No Chemotherapy Photos Photo Uploaded By: Secundino Ginger on 08/19/2018 14:15:46 Wound Measurements Length: (cm) 0 % Reduc Width: (cm) 0 % Reduc Depth: (cm) 0 Epithel Area: (cm) 0 Tunnel Volume: (cm) 0 Underm tion in Area: 100% tion in Volume: 100% ialization: None ing: No ining: No Wound Description Classification: Grade 1 Foul Od Wound Margin: Flat and Intact Slough/ Exudate Amount: None Present or After Cleansing: No Fibrino No Wound Bed Granulation Amount: None  Present (0%) Exposed Structure Necrotic Amount: None Present (0%) Fascia Exposed: No Fat Layer (Subcutaneous Tissue) Exposed: No Tendon Exposed: No Muscle Exposed: No Joint Exposed: No Bone Exposed: No Periwound Skin Texture Texture Color No Abnormalities Noted: No No Abnormalities Noted: No [name], [name] ([id number]) Callus: No Atrophie Blanche: No Crepitus: No Cyanosis: No Excoriation: No Ecchymosis: No Induration: No Erythema: No Rash: No Hemosiderin Staining: No Scarring: No Mottled: No Pallor: No Moisture Rubor: No No Abnormalities Noted: No Dry / Scaly: No Temperature / Pain Maceration: No Temperature: No Abnormality Wound Preparation Ulcer Cleansing: Rinsed/Irrigated with Saline Topical Anesthetic Applied: Other: lidocaine 4%, Electronic Signature(s) Signed: 08/19/2018 5:34:51 PM By: [name], BSN, RN, CWS, [name] Entered By: [name], BSN, RN, CWS, [name] on 08/19/2018 14:24:57 [place], [name] ([id number]) -------------------------------------------------------------------------------- Wound Assessment Details Patient Name: [name], [name] Date of Service: 08/19/2018 2:00 PM Medical Record Number: [id number] Patient Account Number: [id number] Date of Birth/Sex: November 06, 1946 (72 y.o. M) Treating RN: Secundino Ginger Primary Care Jailin Manocchio: [name] Other Clinician: Referring Tanner Yeley: [name] Treating Anitha Kreiser/Extender: [name]: 1 Wound Status Wound Number: 3 Primary Diabetic Wound/Ulcer of the Lower Extremity Etiology: Wound Location: Left Toe Great Wound Open Wounding Event: Gradually Appeared Status: Date Acquired: 07/20/2018 Comorbid Coronary Artery Disease, Hypertension, Type I Weeks Of [name]: 1 History: Diabetes, Osteoarthritis, Received Clustered Wound: No Chemotherapy Pending Amputation On Presentation Wound under [name] by Giselle Brutus outside of [place] Wound  Measurements Length: (cm) 0.1 Width: (cm) 0.1 Depth: (cm) 0.1 Area: (cm) 0.008 Volume: (cm) 0.001 % Reduction in Area: 94.9% % Reduction in Volume: 96.8% Epithelialization: None  Tunneling: No Undermining: No Wound Description Classification: Grade 3 Wagner Verification: X-Ray Wound Margin: Flat and Intact Exudate Amount: Medium Exudate Type: Serous Exudate Color: amber Foul Odor After Cleansing: No Slough/Fibrino No Wound Bed Granulation Amount: None Present (0%) Exposed Structure Necrotic Amount: None Present (0%) Fascia Exposed: No Fat Layer (Subcutaneous Tissue) Exposed: Yes Tendon Exposed: No Muscle Exposed: No Joint Exposed: No Bone Exposed: No [place], Miroslav H. ([id number]) Periwound Skin Texture Texture Color No Abnormalities Noted: No No Abnormalities Noted: No Callus: No Atrophie Blanche: No Crepitus: No Cyanosis: No Excoriation: No Ecchymosis: No Induration: No Erythema: No Rash: No Hemosiderin Staining: No Scarring: No Mottled: No Pallor: No Moisture Rubor: No No Abnormalities Noted: No Dry / Scaly: No Temperature / Pain Maceration: No Temperature: No Abnormality Wound Preparation Ulcer Cleansing: Rinsed/Irrigated with Saline Topical Anesthetic Applied: Other: lidocaine 4%, [name] Notes Wound #3 (Left Toe Great) 1. Cleansed with: Clean wound with Normal Saline 2. Anesthetic Topical Lidocaine 4% cream to wound bed prior to debridement 4. Dressing Applied: Prisma Ag 5. Secondary Dressing Applied Dry Gauze Kerlix/Conform Electronic Signature(s) Signed: 08/19/2018 2:30:40 PM By: Secundino Ginger Signed: 08/19/2018 5:34:51 PM By: [name], BSN, RN, CWS, [name] Entered By: [name], BSN, RN, CWS, [name] on 08/19/2018 14:25:38 [place], [name] ([id number]) -------------------------------------------------------------------------------- Vitals Details Patient Name: Piccirilli, Willam H. Date of Service: 08/19/2018 2:00 PM Medical  Record Number: [id number] Patient Account Number: [id number] Date of Birth/Sex: [birth year] (72 y.o. M) Treating RN: [name] Primary Care Hayly Litsey: [name] Other Clinician: Referring Carey Lafon: [name] Treating Ebba Goll/Extender: [name]: 1 Vital Signs Time Taken: 13:58 Temperature (F): 97.8 Height (in): 69 Pulse (bpm): 71 Weight (lbs): 183.2 Respiratory Rate (breaths/min): 16 Body Mass Index (BMI): 27.1 Blood Pressure (mmHg): 131/61 Reference Range: 80 - 120 mg / dl Electronic Signature(s) Signed: 08/19/2018 4:51:34 PM By: [name] Entered By: [name] on 08/19/2018 14:01:35

## 2018-08-26 ENCOUNTER — Encounter: Payer: Medicare HMO | Admitting: Internal Medicine

## 2018-08-26 DIAGNOSIS — L97211 Non-pressure chronic ulcer of right calf limited to breakdown of skin: Secondary | ICD-10-CM | POA: Diagnosis not present

## 2018-08-26 DIAGNOSIS — L97523 Non-pressure chronic ulcer of other part of left foot with necrosis of muscle: Secondary | ICD-10-CM | POA: Diagnosis not present

## 2018-08-26 DIAGNOSIS — E10621 Type 1 diabetes mellitus with foot ulcer: Secondary | ICD-10-CM | POA: Diagnosis not present

## 2018-08-26 DIAGNOSIS — E10622 Type 1 diabetes mellitus with other skin ulcer: Secondary | ICD-10-CM | POA: Diagnosis not present

## 2018-08-26 DIAGNOSIS — E1069 Type 1 diabetes mellitus with other specified complication: Secondary | ICD-10-CM | POA: Diagnosis not present

## 2018-08-26 DIAGNOSIS — I70203 Unspecified atherosclerosis of native arteries of extremities, bilateral legs: Secondary | ICD-10-CM | POA: Diagnosis not present

## 2018-08-26 DIAGNOSIS — L97521 Non-pressure chronic ulcer of other part of left foot limited to breakdown of skin: Secondary | ICD-10-CM | POA: Diagnosis not present

## 2018-08-26 DIAGNOSIS — M86672 Other chronic osteomyelitis, left ankle and foot: Secondary | ICD-10-CM | POA: Diagnosis not present

## 2018-08-26 DIAGNOSIS — I252 Old myocardial infarction: Secondary | ICD-10-CM | POA: Diagnosis not present

## 2018-08-26 DIAGNOSIS — E1051 Type 1 diabetes mellitus with diabetic peripheral angiopathy without gangrene: Secondary | ICD-10-CM | POA: Diagnosis not present

## 2018-08-28 NOTE — Progress Notes (Signed)
[name], [name] ([id number]) Visit Report for 08/26/2018 Debridement Details Patient Name: Messenger, [name] Date of Service: 08/26/2018 3:45 PM Medical Record Number: [id number] Patient Account Number: [id number] Date of Birth/Sex: [birth year] (72 y.o. M) Treating RN: Cornell Barman Primary Care Provider: [name] Other Clinician: Referring Provider: [name] Treating Provider/Extender: Tito Dine in Treatment: 2 Debridement Performed for Wound #3 Left Toe Great Assessment: Performed By: Physician Ricard Dillon, MD Debridement Type: Debridement Severity of Tissue Pre Limited to breakdown of skin Debridement: Level of Consciousness (Pre- Awake and Alert procedure): Pre-procedure Verification/Time Yes - 15:55 Out Taken: Start Time: 15:55 Pain Control: Other : lidocaine 4% Total Area Debrided (L x W): 0.1 (cm) x 0.1 (cm) = 0.01 (cm) Tissue and other material Non-Viable, Fibrin/Exudate debrided: Level: Non-Viable Tissue Debridement Description: Selective/Open Wound Instrument: Curette Bleeding: None End Time: 16:01 Response to Treatment: Procedure was tolerated well Level of Consciousness Awake and Alert (Post-procedure): Post Debridement Measurements of Total Wound Length: (cm) 0.1 Width: (cm) 0.1 Depth: (cm) 0.1 Volume: (cm) 0.001 Character of Wound/Ulcer Post Debridement: Requires Further Debridement Severity of Tissue Post Debridement: Limited to breakdown of skin Post Procedure Diagnosis Same as Pre-procedure Electronic Signature(s) Signed: 08/26/2018 4:50:13 PM By: Linton Ham MD Signed: 08/26/2018 5:17:02 PM By: Gretta Cool, BSN, RN, CWS, Kim RN, BSN Entered By: Linton Ham on 08/26/2018 16:44:02 Pioneer, [name] ([id number]) -------------------------------------------------------------------------------- HPI Details Patient Name: [name], [name] Date of Service: 08/26/2018 3:45 PM Medical Record  Number: [id number] Patient Account Number: [id number] Date of Birth/Sex: 17-Mar-1946 (72 y.o. M) Treating RN: Cornell Barman Primary Care Provider: [name] Other Clinician: Referring Provider: [name] Treating Provider/Extender: Tito Dine in Treatment: 2 History of Present Illness Location: right anterior shin Quality: Patient reports No Pain. Severity: Patient states wound are getting worse. Duration: Patient has had the wound for > 6 months prior to seeking treatment at the wound center Context: The wound appeared gradually over time Modifying Factors: Other treatment(s) tried include: taken doxycycline and use bacitracin Associated Signs and Symptoms: Patient reports presence of swelling HPI Description: 72 year old male recently seen by his PCP Dr. [name] who saw him for a wound on the right leg which is less tender and he is continuing to use bacitracin and has completed her course of doxycycline. Past medical history of coronary artery disease, diabetes mellitus type 1, hyperlipidemia, hypertension, MI, plantar fascial fibromatosis, pneumonia, status post foot surgery due to trauma on the left side, coronary angioplasty. he is a former smoker and quit in July 1996. last hemoglobin A1c was 7.8 % in April of this year 5/22 2017 -- biopsy of the right lower extremity was sent for pathology and the report is that of a basal cell carcinoma with edges of the biopsy are involved. I have called the patient over the phone( 03/28/16) and discussed the pathology report with him and he is agreeable to see a surgeon for excision and need full. I have also spoken personally to Dr. [name], of [facility] and referred the patient for further wide excision of this lesion and have communicated this to the patient. READMISSION 08/12/18 This is a now 72 year old man with type 1 diabetes. Hemoglobin A1c on 4/29 was 7.9. He is listed as having a history of PAD in the [facility] although the patient doesn't recall this, doesn't complain of any symptoms of claudication and doesn't believe he has had any prior noninvasive arterial test. He is a nonsmoker. He was here  in 2017 with a wound on his Right lower leg. This was biopsied in the clinic and pathology showed a basal cell carcinoma. He went on to have surgery on this area this is currently where I believe his current wounds are located. He generally bumped the lateral part of his right calf 2 weeks ago and has a superficial abrasion. He also has 2 small open areas on the medial part of the tibia in the same area. Our intake nurse noted a stitch coming out of this which was removed. These of the wound sees actually come to the clinic for. However the more concerning area is an area on the tip of the left great toe. He says that this was initially traumatic and he's been to see Dr. [name] podiatrist. Darral Dash been using what I think is Santyl to the wound tip. This has some depth. ABIs in this clinic were non-obtainable on the right and 1.84 on the left. 08/19/18; the patient had arterial studies that did not show evidence of significant hemodynamically compromising occlusions in either leg. There was mild medial atherosclerosis bilaterally. His resting ABIs were within the normal limits at 1.3 on the right and 1.4 on the left. There was normal posterior and anterior tibial artery waveforms and velocities listed on both sides. The patient arrives with what appears to be a healthy surface over the tip of his left great toe. This is indeed surprising. Still looks somewhat friable however. More concerning is the x-ray that we did that showed erosions of the tuft of the distal phalanx the left great toe consistent with osteomyelitis. I attempted to pull this x-ray up on colon health Link however I can't open the film to look at this myself. 08/26/18; I reviewed the patient's x-ray and colon helpline. Indeed there  is fairly obvious erosion of the tip of his left great toe. [name], [name] ([id number]) The wound was initially a traumatic area hitting it sometime in the middle of night in his kitchen. He is a type I diabetic. I have him on Flagyl and Levaquin that I started last week i.e. 7 days ago. He has an appointment with infectious disease on 09/01/18 Electronic Signature(s) Signed: 08/26/2018 4:50:13 PM By: Linton Ham MD Entered By: Linton Ham on 08/26/2018 16:45:58 [name], [name] ([id number]) -------------------------------------------------------------------------------- Physical Exam Details Patient Name: [name], [name] Date of Service: 08/26/2018 3:45 PM Medical Record Number: [id number] Patient Account Number: [id number] Date of Birth/Sex: [birth year] (72 y.o. M) Treating RN: Cornell Barman Primary Care Provider: [name] Other Clinician: Referring Provider: [name] Treating Provider/Extender: Tito Dine in Treatment: 2 Constitutional Sitting or standing Blood Pressure is within target range for patient.. Pulse regular and within target range for patient.Marland Kitchen Respirations regular, non-labored and within target range.. Temperature is normal and within the target range for the patient.Marland Kitchen appears in no distress. Notes Wound exam; there and the tip of the great toe still has some surface slough indicating some degree of drainage although with debridement with a #3 curet it's difficult to see an open area here. This is quite a good development versus when he first came in here. There is no surrounding erythema no real swelling. Electronic Signature(s) Signed: 08/26/2018 4:50:13 PM By: Linton Ham MD Entered By: Linton Ham on 08/26/2018 16:47:03 [name], [name] ([id number]) -------------------------------------------------------------------------------- Physician Orders Details Patient Name: [name], [name] Date of  Service: 08/26/2018 3:45 PM Medical Record Number: [id number] Patient Account Number: [id number] Date of Birth/Sex: 1946-05-11 (72 y.o. M)  Treating RN: Cornell Barman Primary Care Provider: [name] Other Clinician: Referring Provider: [name] Treating Provider/Extender: Tito Dine in Treatment: 2 Verbal / Phone Orders: No Diagnosis Coding Wound Cleansing Wound #3 Left Toe Great o Cleanse wound with mild soap and water o May shower with protection. Anesthetic (add to Medication List) Wound #3 Left Toe Great o Topical Lidocaine 4% cream applied to wound bed prior to debridement (In Clinic Only). Primary Wound Dressing Wound #3 Left Toe Great o Silver Collagen Secondary Dressing Wound #3 Left Toe Great o Gauze and Kerlix/Conform Dressing Change Frequency Wound #3 Left Toe Great o Change dressing every other day. Follow-up Appointments Wound #3 Left Toe Great o Return Appointment in 1 week. Medications-please add to medication list. Wound #3 Left Toe Great o P.O. Antibiotics Electronic Signature(s) Signed: 08/26/2018 4:50:13 PM By: Linton Ham MD Signed: 08/26/2018 5:17:02 PM By: Gretta Cool, BSN, RN, CWS, Kim RN, BSN Entered By: Gretta Cool, BSN, RN, CWS, Kim on 08/26/2018 16:04:46 [name], [name] ([id number]) -------------------------------------------------------------------------------- Problem List Details Patient Name: [name], [name] Date of Service: 08/26/2018 3:45 PM Medical Record Number: [id number] Patient Account Number: [id number] Date of Birth/Sex: Apr 10, 1946 (72 y.o. M) Treating RN: Cornell Barman Primary Care Provider: [name] Other Clinician: Referring Provider: [name] Treating Provider/Extender: Tito Dine in Treatment: 2 Active Problems ICD-10 Evaluated Encounter Code Description Active Date Today Diagnosis E10.621 Type 1 diabetes mellitus with foot ulcer 08/12/2018 No Yes L97.523  Non-pressure chronic ulcer of other part of left foot with 08/12/2018 No Yes necrosis of muscle L97.211 Non-pressure chronic ulcer of right calf limited to breakdown 08/12/2018 No Yes of skin E10.51 Type 1 diabetes mellitus with diabetic peripheral angiopathy 08/12/2018 No Yes without gangrene N82.956 Other chronic osteomyelitis, left ankle and foot 08/19/2018 No Yes Inactive Problems Resolved Problems Electronic Signature(s) Signed: 08/26/2018 4:50:13 PM By: Linton Ham MD Entered By: Linton Ham on 08/26/2018 16:43:34 [name], [name] ([id number]) -------------------------------------------------------------------------------- Progress Note Details Patient Name: [name], [name] Date of Service: 08/26/2018 3:45 PM Medical Record Number: [id number] Patient Account Number: [id number] Date of Birth/Sex: 1945/12/22 (72 y.o. M) Treating RN: Cornell Barman Primary Care Provider: [name] Other Clinician: Referring Provider: [name] Treating Provider/Extender: Tito Dine in Treatment: 2 Subjective History of Present Illness (HPI) The following HPI elements were documented for the patient's wound: Location: right anterior shin Quality: Patient reports No Pain. Severity: Patient states wound are getting worse. Duration: Patient has had the wound for > 6 months prior to seeking treatment at the wound center Context: The wound appeared gradually over time Modifying Factors: Other treatment(s) tried include: taken doxycycline and use bacitracin Associated Signs and Symptoms: Patient reports presence of swelling 72 year old male recently seen by his PCP Dr. [name] who saw him for a wound on the right leg which is less tender and he is continuing to use bacitracin and has completed her course of doxycycline. Past medical history of coronary artery disease, diabetes mellitus type 1, hyperlipidemia, hypertension, MI, plantar fascial fibromatosis,  pneumonia, status post foot surgery due to trauma on the left side, coronary angioplasty. he is a former smoker and quit in July 1996. last hemoglobin A1c was 7.8 % in April of this year 5/22 2017 -- biopsy of the right lower extremity was sent for pathology and the report is that of a basal cell carcinoma with edges of the biopsy are involved. I have called the patient over the phone( 03/28/16) and discussed the pathology report with  him and he is agreeable to see a surgeon for excision and need full. I have also spoken personally to Dr. [name], of [facility] surgical and referred the patient for further wide excision of this lesion and have communicated this to the patient. READMISSION 08/12/18 This is a now 72 year old man with type 1 diabetes. Hemoglobin A1c on 4/29 was 7.9. He is listed as having a history of PAD in the [place] [facility] records although the patient doesn't recall this, doesn't complain of any symptoms of claudication and doesn't believe he has had any prior noninvasive arterial test. He is a nonsmoker. He was here in 2017 with a wound on his Right lower leg. This was biopsied in the clinic and pathology showed a basal cell carcinoma. He went on to have surgery on this area this is currently where I believe his current wounds are located. He generally bumped the lateral part of his right calf 2 weeks ago and has a superficial abrasion. He also has 2 small open areas on the medial part of the tibia in the same area. Our intake nurse noted a stitch coming out of this which was removed. These of the wound sees actually come to the clinic for. However the more concerning area is an area on the tip of the left great toe. He says that this was initially traumatic and he's been to see Dr. [name] podiatrist. Darral Dash been using what I think is Santyl to the wound tip. This has some depth. ABIs in this clinic were non-obtainable on the right and 1.84 on the left. 08/19/18; the patient had arterial  studies that did not show evidence of significant hemodynamically compromising occlusions in either leg. There was mild medial atherosclerosis bilaterally. His resting ABIs were within the normal limits at 1.3 on the right and 1.4 on the left. There was normal posterior and anterior tibial artery waveforms and velocities listed on both sides. The patient arrives with what appears to be a healthy surface over the tip of his left great toe. This is indeed surprising. Still looks somewhat friable however. More concerning is the x-ray that we did that showed erosions of the tuft of the distal Kratochvil, Endy H. ([id number]) phalanx the left great toe consistent with osteomyelitis. I attempted to pull this x-ray up on colon health Link however I can't open the film to look at this myself. 08/26/18; I reviewed the patient's x-ray and colon helpline. Indeed there is fairly obvious erosion of the tip of his left great toe. The wound was initially a traumatic area hitting it sometime in the middle of night in his kitchen. He is a type I diabetic. I have him on Flagyl and Levaquin that I started last week i.e. 7 days ago. He has an appointment with infectious disease on 09/01/18 Objective Constitutional Sitting or standing Blood Pressure is within target range for patient.. Pulse regular and within target range for patient.Marland Kitchen Respirations regular, non-labored and within target range.. Temperature is normal and within the target range for the patient.Marland Kitchen appears in no distress. Vitals Time Taken: 3:42 PM, Height: 69 in, Weight: 183.2 lbs, BMI: 27.1, Temperature: 97.8 F, Pulse: 63 bpm, Respiratory Rate: 16 breaths/min, Blood Pressure: 124/53 mmHg. General Notes: Wound exam; there and the tip of the great toe still has some surface slough indicating some degree of drainage although with debridement with a #3 curet it's difficult to see an open area here. This is quite a good development versus when he  first came in  here. There is no surrounding erythema no real swelling. Integumentary (Hair, Skin) Wound #3 status is Open. Original cause of wound was Gradually Appeared. The wound is located on the Left Toe Great. The wound measures 0.1cm length x 0.1cm width x 0.1cm depth; 0.008cm^2 area and 0.001cm^3 volume. There is Fat Layer (Subcutaneous Tissue) Exposed exposed. There is no tunneling or undermining noted. There is a none present amount of drainage noted. The wound margin is flat and intact. There is no granulation within the wound bed. There is no necrotic tissue within the wound bed. The periwound skin appearance exhibited: Callus. The periwound skin appearance did not exhibit: Crepitus, Excoriation, Induration, Rash, Scarring, Dry/Scaly, Maceration, Atrophie Blanche, Cyanosis, Ecchymosis, Hemosiderin Staining, Mottled, Pallor, Rubor, Erythema. Periwound temperature was noted as No Abnormality. Assessment Active Problems ICD-10 Type 1 diabetes mellitus with foot ulcer Non-pressure chronic ulcer of other part of left foot with necrosis of muscle Non-pressure chronic ulcer of right calf limited to breakdown of skin Type 1 diabetes mellitus with diabetic peripheral angiopathy without gangrene Other chronic osteomyelitis, left ankle and foot [name], Melvern H. ([id number]) Procedures Wound #3 Pre-procedure diagnosis of Wound #3 is a Diabetic Wound/Ulcer of the Lower Extremity located on the Left Toe Great .Severity of Tissue Pre Debridement is: Limited to breakdown of skin. There was a Selective/Open Wound Non-Viable Tissue Debridement with a total area of 0.01 sq cm performed by Ricard Dillon, MD. With the following instrument (s): Curette to remove Non-Viable tissue/material. Material removed includes Fibrin/Exudate after achieving pain control using Other (lidocaine 4%). No specimens were taken. A time out was conducted at 15:55, prior to the start of the procedure. There was no  bleeding. The procedure was tolerated well. Post Debridement Measurements: 0.1cm length x 0.1cm width x 0.1cm depth; 0.001cm^3 volume. Character of Wound/Ulcer Post Debridement requires further debridement. Severity of Tissue Post Debridement is: Limited to breakdown of skin. Post procedure Diagnosis Wound #3: Same as Pre-Procedure Plan Wound Cleansing: Wound #3 Left Toe Great: Cleanse wound with mild soap and water May shower with protection. Anesthetic (add to Medication List): Wound #3 Left Toe Great: Topical Lidocaine 4% cream applied to wound bed prior to debridement (In Clinic Only). Primary Wound Dressing: Wound #3 Left Toe Great: Silver Collagen Secondary Dressing: Wound #3 Left Toe Great: Gauze and Kerlix/Conform Dressing Change Frequency: Wound #3 Left Toe Great: Change dressing every other day. Follow-up Appointments: Wound #3 Left Toe Great: Return Appointment in 1 week. Medications-please add to medication list.: Wound #3 Left Toe Great: P.O. Antibiotics #1 week continue with silver collagen Kerlix and Coban. #2 he continues on Flagyl and Levaquin that he is tolerating well #3 sees infectious disease in 6 days' time I will see him next week after that. #4 I've reviewed his x-ray and: [place] showed the patient the worrisome area on the tip of the great toe. #5 he has a residual abrasion on the right anterior calf big occurred after the area on his left first toe. This is still erythematous. He does not think there was another open area here. He also has a history of basal cell carcinoma on the right leg. We'll need to watch this over this certainly doesn't look like recurrent basal cell COBEY, RAINERI ([id number]) Electronic Signature(s) Signed: 08/26/2018 4:50:13 PM By: Linton Ham MD Entered By: Linton Ham on 08/26/2018 16:48:44 Moca, [name]  ([id number]) -------------------------------------------------------------------------------- SuperBill Details Patient Name: Warmoth, Schawn H. Date of Service: 08/26/2018 Medical Record Number: [id number] Patient Account Number:  [id number] Date of Birth/Sex: [birth year] (72 y.o. M) Treating RN: Cornell Barman Primary Care Provider: [name] Other Clinician: Referring Provider: [name] Treating Provider/Extender: Tito Dine in Treatment: 2 Diagnosis Coding ICD-10 Codes Code Description [phone] Type 1 diabetes mellitus with foot ulcer L97.523 Non-pressure chronic ulcer of other part of left foot with necrosis of muscle L97.211 Non-pressure chronic ulcer of right calf limited to breakdown of skin E10.51 Type 1 diabetes mellitus with diabetic peripheral angiopathy without gangrene M86.672 Other chronic osteomyelitis, left ankle and foot Facility Procedures CPT4 Code: [id number] Description: [phone] - WOUND CARE VISIT-LEV 2 EST PT Modifier: Quantity: 1 CPT4 Code: [id number] Description: 83358 - DEBRIDE WOUND 1ST 20 SQ CM OR < ICD-10 Diagnosis Description L97.523 Non-pressure chronic ulcer of other part of left foot with necr Modifier: osis of muscle Quantity: 1 Physician Procedures CPT4 Code: [id number] Description: 97597 - WC PHYS DEBR WO ANESTH 20 SQ CM ICD-10 Diagnosis Description L97.523 Non-pressure chronic ulcer of other part of left foot with necr Modifier: osis of muscle Quantity: 1 Electronic Signature(s) Signed: 08/26/2018 4:50:13 PM By: Linton Ham MD Entered By: Linton Ham on 08/26/2018 16:48:59

## 2018-08-28 NOTE — Progress Notes (Signed)
SHONTA, PHILLIS ([id number]) Visit Report for 08/26/2018 Arrival Information Details Patient Name: [name] Date of Service: 08/26/2018 3:45 PM Medical Record Number: [id number] Patient Account Number: [id number] Date of Birth/Sex: 01-Jul-1946 (72 y.o. M) Treating RN: Cornell Barman Primary Care Mardi Cannady: [name] Other Clinician: Referring Bernhard Koskinen: [name] Treating Shenequa Howse/Extender: Tito Dine in Treatment: 2 Visit Information History Since Last Visit Added or deleted any medications: No Patient Arrived: Ambulatory Any new allergies or adverse reactions: No Arrival Time: 15:39 Had a fall or experienced change in No Accompanied By: self activities of daily living that may affect Transfer Assistance: None risk of falls: Patient Identification Verified: Yes Signs or symptoms of abuse/neglect since last visito No Secondary Verification Process Completed: Yes Hospitalized since last visit: No Implantable device outside of the clinic excluding No cellular tissue based products placed in the center since last visit: Has Dressing in Place as Prescribed: Yes Pain Present Now: No Electronic Signature(s) Signed: 08/26/2018 4:28:03 PM By: Lorine Bears RCP, RRT, CHT Entered By: Lorine Bears on 08/26/2018 15:42:37 [name] ([id number]) -------------------------------------------------------------------------------- Clinic Level of Care Assessment Details Patient Name: [name], [name] Date of Service: 08/26/2018 3:45 PM Medical Record Number: [id number] Patient Account Number: [id number] Date of Birth/Sex: 09-04-1946 (72 y.o. M) Treating RN: Cornell Barman Primary Care Quinn Quam: [name] Other Clinician: Referring Michala Deblanc: [name] Treating Kristofor Michalowski/Extender: Tito Dine in Treatment: 2 Clinic Level of Care Assessment Items TOOL 4 Quantity Score []  - Use when only an EandM  is performed on FOLLOW-UP visit 0 ASSESSMENTS - Nursing Assessment / Reassessment []  - Reassessment of Co-morbidities (includes updates in patient status) 0 X- 1 5 Reassessment of Adherence to Treatment Plan ASSESSMENTS - Wound and Skin Assessment / Reassessment X - Simple Wound Assessment / Reassessment - one wound 1 5 []  - 0 Complex Wound Assessment / Reassessment - multiple wounds []  - 0 Dermatologic / Skin Assessment (not related to wound area) ASSESSMENTS - Focused Assessment []  - Circumferential Edema Measurements - multi extremities 0 []  - 0 Nutritional Assessment / Counseling / Intervention []  - 0 Lower Extremity Assessment (monofilament, tuning fork, pulses) []  - 0 Peripheral Arterial Disease Assessment (using hand held doppler) ASSESSMENTS - Ostomy and/or Continence Assessment and Care []  - Incontinence Assessment and Management 0 []  - 0 Ostomy Care Assessment and Management (repouching, etc.) PROCESS - Coordination of Care X - Simple Patient / Family Education for ongoing care 1 15 []  - 0 Complex (extensive) Patient / Family Education for ongoing care X- 1 10 Staff obtains Programmer, systems, Records, Test Results / Process Orders []  - 0 Staff telephones HHA, Nursing Homes / Clarify orders / etc []  - 0 Routine Transfer to another Facility (non-emergent condition) []  - 0 Routine Hospital Admission (non-emergent condition) []  - 0 New Admissions / Biomedical engineer / Ordering NPWT, Apligraf, etc. []  - 0 Emergency Hospital Admission (emergent condition) X- 1 10 Simple Discharge Coordination [name], [name] ([id number]) []  - 0 Complex (extensive) Discharge Coordination PROCESS - Special Needs []  - Pediatric / Minor Patient Management 0 []  - 0 Isolation Patient Management []  - 0 Hearing / Language / Visual special needs []  - 0 Assessment of Community assistance (transportation, D/C planning, etc.) []  - 0 Additional assistance / Altered mentation []  -  0 Support Surface(s) Assessment (bed, cushion, seat, etc.) INTERVENTIONS - Wound Cleansing / Measurement X - Simple Wound Cleansing - one wound 1 5 []  - 0 Complex Wound Cleansing - multiple wounds X-  1 5 Wound Imaging (photographs - any number of wounds) []  - 0 Wound Tracing (instead of photographs) X- 1 5 Simple Wound Measurement - one wound []  - 0 Complex Wound Measurement - multiple wounds INTERVENTIONS - Wound Dressings X - Small Wound Dressing one or multiple wounds 1 10 []  - 0 Medium Wound Dressing one or multiple wounds []  - 0 Large Wound Dressing one or multiple wounds []  - 0 Application of Medications - topical []  - 0 Application of Medications - injection INTERVENTIONS - Miscellaneous []  - External ear exam 0 []  - 0 Specimen Collection (cultures, biopsies, blood, body fluids, etc.) []  - 0 Specimen(s) / Culture(s) sent or taken to Lab for analysis []  - 0 Patient Transfer (multiple staff / Civil Service fast streamer / Similar devices) []  - 0 Simple Staple / Suture removal (25 or less) []  - 0 Complex Staple / Suture removal (26 or more) []  - 0 Hypo / Hyperglycemic Management (close monitor of Blood Glucose) []  - 0 Ankle / Brachial Index (ABI) - do not check if billed separately X- 1 5 Vital Signs [name], [name] ([id number]) Has the patient been seen at the hospital within the last three years: Yes Total Score: 75 Level Of Care: New/Established - Level 2 Electronic Signature(s) Signed: 08/26/2018 5:17:02 PM By: Gretta Cool, BSN, RN, CWS, Kim RN, BSN Entered By: Gretta Cool, BSN, RN, CWS, Kim on 08/26/2018 16:05:15 [name] ([id number]) -------------------------------------------------------------------------------- Lower Extremity Assessment Details Patient Name: [name], [name] Date of Service: 08/26/2018 3:45 PM Medical Record Number: [id number] Patient Account Number: [id number] Date of Birth/Sex: Jun 30, 1946 (72 y.o. M) Treating RN: Secundino Ginger Primary  Care Nash Bolls: [name] Other Clinician: Referring Doren Kaspar: [name] Treating Matisyn Cabeza/Extender: Tito Dine in Treatment: 2 Electronic Signature(s) Signed: 08/26/2018 4:03:08 PM By: Secundino Ginger Entered By: Secundino Ginger on 08/26/2018 15:50:32 [place], Drue [name] ([id number]) -------------------------------------------------------------------------------- Multi Wound Chart Details Patient Name: Cuadras, Loy H. Date of Service: 08/26/2018 3:45 PM Medical Record Number: [id number] Patient Account Number: [id number] Date of Birth/Sex: Apr 08, 1946 (72 y.o. M) Treating RN: Cornell Barman Primary Care Daronte Shostak: [name] Other Clinician: Referring Ludivina Guymon: [name] Treating Amaura Authier/Extender: Tito Dine in Treatment: 2 Vital Signs Height(in): 69 Pulse(bpm): 63 Weight(lbs): 183.2 Blood Pressure(mmHg): 124/53 Body Mass Index(BMI): 27 Temperature(F): 97.8 Respiratory Rate 16 (breaths/min): Photos: [N/A:N/A] Wound Location: Left Toe Great N/A N/A Wounding Event: Gradually Appeared N/A N/A Primary Etiology: Diabetic Wound/Ulcer of the N/A N/A Lower Extremity Comorbid History: Coronary Artery Disease, N/A N/A Hypertension, Type I Diabetes, Osteoarthritis, Received Chemotherapy Date Acquired: 07/20/2018 N/A N/A Weeks of Treatment: 2 N/A N/A Wound Status: Open N/A N/A Pending Amputation on Yes N/A N/A Presentation: Measurements L x W x D 0.1x0.1x0.1 N/A N/A (cm) Area (cm) : 0.008 N/A N/A Volume (cm) : 0.001 N/A N/A % Reduction in Area: 94.90% N/A N/A % Reduction in Volume: 96.80% N/A N/A Classification: Grade 3 N/A N/A Exudate Amount: None Present N/A N/A Wound Margin: Flat and Intact N/A N/A Granulation Amount: None Present (0%) N/A N/A Necrotic Amount: None Present (0%) N/A N/A Exposed Structures: Fat Layer (Subcutaneous N/A N/A Tissue) Exposed: Yes Fascia: No Tendon: No Muscle: No Fendrick, Ger H.  ([id number]) Joint: No Bone: No Epithelialization: None N/A N/A Debridement: Debridement - Selective/Open N/A N/A Wound Pre-procedure 15:55 N/A N/A Verification/Time Out Taken: Pain Control: Other N/A N/A Level: Non-Viable Tissue N/A N/A Debridement Area (sq cm): 0.01 N/A N/A Instrument: Curette N/A N/A Bleeding: None N/A N/A Debridement Treatment Procedure was tolerated well N/A  N/A Response: Post Debridement 0.1x0.1x0.1 N/A N/A Measurements L x W x D (cm) Post Debridement Volume: 0.001 N/A N/A (cm) Periwound Skin Texture: Callus: Yes N/A N/A Excoriation: No Induration: No Crepitus: No Rash: No Scarring: No Periwound Skin Moisture: Maceration: No N/A N/A Dry/Scaly: No Periwound Skin Color: Atrophie Blanche: No N/A N/A Cyanosis: No Ecchymosis: No Erythema: No Hemosiderin Staining: No Mottled: No Pallor: No Rubor: No Temperature: No Abnormality N/A N/A Tenderness on Palpation: No N/A N/A Wound Preparation: Ulcer Cleansing: N/A N/A Rinsed/Irrigated with Saline Topical Anesthetic Applied: Other: lidocaine 4% Procedures Performed: Debridement N/A N/A Treatment Notes Electronic Signature(s) Signed: 08/26/2018 4:50:13 PM By: Linton Ham MD Entered By: Linton Ham on 08/26/2018 16:43:44 [place], Drue [name] ([id number]) -------------------------------------------------------------------------------- Multi-Disciplinary Care Plan Details Patient Name: Doorn, Jonavan H. Date of Service: 08/26/2018 3:45 PM Medical Record Number: [id number] Patient Account Number: [id number] Date of Birth/Sex: 1946/02/28 (72 y.o. M) Treating RN: Cornell Barman Primary Care Arnola Crittendon: [name] Other Clinician: Referring Karmon Andis: [name] Treating John Vasconcelos/Extender: Tito Dine in Treatment: 2 Active Inactive ` Orientation to the Wound Care Program Nursing Diagnoses: Knowledge deficit related to the wound healing center  program Goals: Patient/caregiver will verbalize understanding of the [place] Program Date Initiated: 08/12/2018 Target Resolution Date: 09/12/2018 Goal Status: Active Interventions: Provide education on orientation to the wound center Notes: ` Osteomyelitis Nursing Diagnoses: Infection: osteomyelitis Goals: Patient's osteomyelitis will resolve Date Initiated: 08/19/2018 Target Resolution Date: 10/23/2018 Goal Status: Active Interventions: Assess for signs and symptoms of osteomyelitis resolution every visit Notes: ` Venous Leg Ulcer Nursing Diagnoses: Actual venous Insuffiency (use after diagnosis is confirmed) Goals: Patient will maintain optimal edema control Date Initiated: 08/12/2018 Target Resolution Date: 09/12/2018 Goal Status: Active Interventions: Virgen, Ahmed H. ([id number]) Assess peripheral edema status every visit. Notes: ` Wound/Skin Impairment Nursing Diagnoses: Impaired tissue integrity Goals: Ulcer/skin breakdown will have a volume reduction of 30% by week 4 Date Initiated: 08/12/2018 Target Resolution Date: 09/12/2018 Goal Status: Active Interventions: Assess ulceration(s) every visit Treatment Activities: Topical wound management initiated : 08/12/2018 Notes: Electronic Signature(s) Signed: 08/26/2018 5:17:02 PM By: Gretta Cool, BSN, RN, CWS, Kim RN, BSN Entered By: Gretta Cool, BSN, RN, CWS, Kim on 08/26/2018 16:03:27 [place], Drue [name] ([id number]) -------------------------------------------------------------------------------- Pain Assessment Details Patient Name: [place], Quadir H. Date of Service: 08/26/2018 3:45 PM Medical Record Number: [id number] Patient Account Number: [id number] Date of Birth/Sex: [birth year] (72 y.o. M) Treating RN: Cornell Barman Primary Care Yousif Edelson: [name] Other Clinician: Referring Neri Vieyra: [name] Treating Ember Henrikson/Extender: Tito Dine in Treatment: 2 Active  Problems Location of Pain Severity and Description of Pain Patient Has Paino No Site Locations Pain Management and Medication Current Pain Management: Electronic Signature(s) Signed: 08/26/2018 4:28:03 PM By: Lorine Bears RCP, RRT, CHT Signed: 08/26/2018 5:17:02 PM By: Gretta Cool, BSN, RN, CWS, Kim RN, BSN Entered By: Lorine Bears on 08/26/2018 15:42:44 [place], Drue [name] ([id number]) -------------------------------------------------------------------------------- Patient/Caregiver Education Details Patient Name: [name]. Date of Service: 08/26/2018 3:45 PM Medical Record Number: [id number] Patient Account Number: [id number] Date of Birth/Gender: 04/24/1946 (72 y.o. M) Treating RN: Cornell Barman Primary Care Physician: [name] Other Clinician: Referring Physician: [name] Treating Physician/Extender: Tito Dine in Treatment: 2 Education Assessment Education Provided To: Patient Education Topics Provided Wound/Skin Impairment: Handouts: Caring for Your Ulcer Methods: Demonstration, Explain/Verbal Responses: State content correctly Electronic Signature(s) Signed: 08/26/2018 5:17:02 PM By: Gretta Cool, BSN, RN, CWS, Kim RN, BSN Entered By: Gretta Cool, BSN, RN, CWS, Kim on 08/26/2018 16:06:00 [place], Drue [name] ([id number]) --------------------------------------------------------------------------------  Wound Assessment Details Patient Name: Roemer, Kobie H. Date of Service: 08/26/2018 3:45 PM Medical Record Number: [id number] Patient Account Number: [id number] Date of Birth/Sex: 1946/07/13 (72 y.o. M) Treating RN: Secundino Ginger Primary Care Sylvan Sookdeo: [name] Other Clinician: Referring Denelle Capurro: [name] Treating Lynanne Delgreco/Extender: Tito Dine in Treatment: 2 Wound Status Wound Number: 3 Primary Diabetic Wound/Ulcer of the Lower Extremity Etiology: Wound Location: Left Toe Great Wound  Open Wounding Event: Gradually Appeared Status: Date Acquired: 07/20/2018 Comorbid Coronary Artery Disease, Hypertension, Type I Weeks Of Treatment: 2 History: Diabetes, Osteoarthritis, Received Clustered Wound: No Chemotherapy Pending Amputation On Presentation Wound under treatment by Coy Vandoren outside of [place] Uploaded By: Secundino Ginger on 08/26/2018 15:58:36 Wound Measurements Length: (cm) 0.1 Width: (cm) 0.1 Depth: (cm) 0.1 Area: (cm) 0.008 Volume: (cm) 0.001 % Reduction in Area: 94.9% % Reduction in Volume: 96.8% Epithelialization: None Tunneling: No Undermining: No Wound Description Classification: Grade 3 Wound Margin: Flat and Intact Exudate Amount: None Present Foul Odor After Cleansing: No Slough/Fibrino No Wound Bed Granulation Amount: None Present (0%) Exposed Structure Necrotic Amount: None Present (0%) Fascia Exposed: No Fat Layer (Subcutaneous Tissue) Exposed: Yes Tendon Exposed: No Muscle Exposed: No Joint Exposed: No Bone Exposed: No Periwound Skin Texture Miao, Abdoulaye H. ([id number]) Texture Color No Abnormalities Noted: No No Abnormalities Noted: No Callus: Yes Atrophie Blanche: No Crepitus: No Cyanosis: No Excoriation: No Ecchymosis: No Induration: No Erythema: No Rash: No Hemosiderin Staining: No Scarring: No Mottled: No Pallor: No Moisture Rubor: No No Abnormalities Noted: No Dry / Scaly: No Temperature / Pain Maceration: No Temperature: No Abnormality Wound Preparation Ulcer Cleansing: Rinsed/Irrigated with Saline Topical Anesthetic Applied: Other: lidocaine 4%, Electronic Signature(s) Signed: 08/26/2018 4:03:08 PM By: Secundino Ginger Entered By: Secundino Ginger on 08/26/2018 15:54:28 [place], Drue [name] ([id number]) -------------------------------------------------------------------------------- Vitals Details Patient Name: Overfelt, Jlen H. Date of Service: 08/26/2018 3:45 PM Medical Record Number:  [id number] Patient Account Number: [id number] Date of Birth/Sex: Oct 14, 1946 (72 y.o. M) Treating RN: Cornell Barman Primary Care Laniece Hornbaker: [name] Other Clinician: Referring Neveah Bang: [name] Treating Desteni Piscopo/Extender: Tito Dine in Treatment: 2 Vital Signs Time Taken: 15:42 Temperature (F): 97.8 Height (in): 69 Pulse (bpm): 63 Weight (lbs): 183.2 Respiratory Rate (breaths/min): 16 Body Mass Index (BMI): 27.1 Blood Pressure (mmHg): 124/53 Reference Range: 80 - 120 mg / dl Electronic Signature(s) Signed: 08/26/2018 4:28:03 PM By: Lorine Bears RCP, RRT, CHT Entered By: Lorine Bears on 08/26/2018 15:45:34

## 2018-08-31 DIAGNOSIS — E103312 Type 1 diabetes mellitus with moderate nonproliferative diabetic retinopathy with macular edema, left eye: Secondary | ICD-10-CM | POA: Diagnosis not present

## 2018-09-01 ENCOUNTER — Ambulatory Visit: Payer: Medicare HMO | Attending: Infectious Diseases | Admitting: Infectious Diseases

## 2018-09-01 VITALS — BP 128/75 | HR 79 | Temp 97.6°F

## 2018-09-01 DIAGNOSIS — E109 Type 1 diabetes mellitus without complications: Secondary | ICD-10-CM | POA: Diagnosis not present

## 2018-09-01 DIAGNOSIS — Z8551 Personal history of malignant neoplasm of bladder: Secondary | ICD-10-CM | POA: Diagnosis not present

## 2018-09-01 DIAGNOSIS — Z955 Presence of coronary angioplasty implant and graft: Secondary | ICD-10-CM | POA: Diagnosis not present

## 2018-09-01 DIAGNOSIS — E119 Type 2 diabetes mellitus without complications: Secondary | ICD-10-CM | POA: Insufficient documentation

## 2018-09-01 DIAGNOSIS — Z79899 Other long term (current) drug therapy: Secondary | ICD-10-CM | POA: Insufficient documentation

## 2018-09-01 DIAGNOSIS — R358 Other polyuria: Secondary | ICD-10-CM

## 2018-09-01 DIAGNOSIS — I2510 Atherosclerotic heart disease of native coronary artery without angina pectoris: Secondary | ICD-10-CM | POA: Insufficient documentation

## 2018-09-01 DIAGNOSIS — S99822D Other specified injuries of left foot, subsequent encounter: Secondary | ICD-10-CM

## 2018-09-01 DIAGNOSIS — Z9221 Personal history of antineoplastic chemotherapy: Secondary | ICD-10-CM

## 2018-09-01 DIAGNOSIS — I255 Ischemic cardiomyopathy: Secondary | ICD-10-CM | POA: Diagnosis not present

## 2018-09-01 DIAGNOSIS — Z794 Long term (current) use of insulin: Secondary | ICD-10-CM | POA: Insufficient documentation

## 2018-09-01 DIAGNOSIS — I252 Old myocardial infarction: Secondary | ICD-10-CM | POA: Insufficient documentation

## 2018-09-01 DIAGNOSIS — K219 Gastro-esophageal reflux disease without esophagitis: Secondary | ICD-10-CM | POA: Diagnosis not present

## 2018-09-01 DIAGNOSIS — Z7982 Long term (current) use of aspirin: Secondary | ICD-10-CM | POA: Diagnosis not present

## 2018-09-01 DIAGNOSIS — Z85828 Personal history of other malignant neoplasm of skin: Secondary | ICD-10-CM | POA: Insufficient documentation

## 2018-09-01 DIAGNOSIS — M868X7 Other osteomyelitis, ankle and foot: Secondary | ICD-10-CM | POA: Diagnosis not present

## 2018-09-01 DIAGNOSIS — E785 Hyperlipidemia, unspecified: Secondary | ICD-10-CM | POA: Insufficient documentation

## 2018-09-01 DIAGNOSIS — I10 Essential (primary) hypertension: Secondary | ICD-10-CM | POA: Insufficient documentation

## 2018-09-01 DIAGNOSIS — W228XXD Striking against or struck by other objects, subsequent encounter: Secondary | ICD-10-CM

## 2018-09-01 DIAGNOSIS — M869 Osteomyelitis, unspecified: Secondary | ICD-10-CM

## 2018-09-01 NOTE — Progress Notes (Signed)
NAME: [name]  DOB: Nov 20, 1945  MRN: [id number]  Date/Time: 09/01/2018 11:55 AM Subjective:  REASON FOR CONSULT:  osteomyelitis of the left great toe ? [name] is a 72 y.o. male with a history of DM Type 1,, CAD s/p LAD stent ,bladder cancer treated with mitomycin completed treatment sept 2019 Sustained an injury to the left great toe by hitting on the kitchen table 1st week of September. He was followed by podiatist, Dr.Cline and then went to see wound care physician for a wound on the rt leg.  He took an xray of the left foot on 08/17/2018 and found erosion of the tuft of distal phalanx of the left great toe. As he had a wound  he gave him levaquin and flagyl for 2 weeks and referred the patient to me for osteomyelitis. No cultures were done.  No bone was sent for pathology Pt is doing well, the wound has closed completely . He says he has had increasing blood sugar with Levaquin and also has had some diarrhea.  This is been under control now.  He has an appointment to see Dr. Dellia Nims the wound care specialist tomorrow. Past Medical History:  Diagnosis Date  . Anemia   . Arthritis    right knee  . Cancer [facility])    BLADDER CANCER (2018)skin cancer  . Coronary artery disease   . Diabetes mellitus   . Edema   . GERD (gastroesophageal reflux disease)    rare-NO MEDS  . Heart attack ([place]) 2007  . Hyperlipidemia   . Hypertension   . Hypoglycemia    history of episodes x2  . Ischemic cardiomyopathy   . Myocardial infarction [facility])     Past Surgical History:  Procedure Laterality Date  . CARDIAC CATHETERIZATION    . COLONOSCOPY WITH PROPOFOL N/A 02/18/2018   Procedure: COLONOSCOPY WITH PROPOFOL;  Surgeon: Toledo, Benay Pike, MD;  Location: [facility] ENDOSCOPY;  Service: Gastroenterology;  Laterality: N/A;  . CORONARY ANGIOPLASTY    . CORONARY STENT PLACEMENT     To an occluded LAD x2  . CYSTOSCOPY WITH BIOPSY N/A 10/21/2017   Procedure: CYSTOSCOPY WITH BLADDER BIOPSY;   Surgeon: Royston Cowper, MD;  Location: [facility] ORS;  Service: Urology;  Laterality: N/A;  . EXCISION MASS LOWER EXTREMETIES Right 06/05/2016   Procedure: EXCISION OF LESION RIGHT LOWER LEG;  Surgeon: Hubbard Robinson, MD;  Location: [facility] ORS;  Service: General;  Laterality: Right;  . SKIN CANCER EXCISION     chest  . TRANSURETHRAL RESECTION OF BLADDER TUMOR N/A 07/15/2017   Procedure: TRANSURETHRAL RESECTION OF BLADDER TUMOR (TURBT);  Surgeon: Royston Cowper, MD;  Location: [facility] ORS;  Service: Urology;  Laterality: N/A;    Social history  non-smoker Has a horse farm and also boards horses.  But he does not ride  Family History  Problem Relation Age of Onset  . Heart disease Mother   . Heart attack Mother   . Heart disease Father   . Prostate cancer Brother    No Known Allergies  ? Current Outpatient Medications  Medication Sig Dispense Refill  . aspirin EC 81 MG tablet Take 81 mg by mouth daily.    Marland Kitchen atorvastatin (LIPITOR) 80 MG tablet TAKE 1 TABLET BY MOUTH AT BEDTIME 90 tablet 3  . carvedilol (COREG) 12.5 MG tablet take 12.5 mg by mouth twice a day with food 180 tablet 3  . docusate sodium (COLACE) 100 MG capsule Take 2 capsules (200 mg total) by  mouth 2 (two) times daily. 120 capsule 3  . ezetimibe (ZETIA) 10 MG tablet Take 1 tablet (10 mg total) by mouth daily. 90 tablet 3  . furosemide (LASIX) 20 MG tablet take 1 tablet by mouth once daily (Patient taking differently: take 20 mg by mouth once daily) 90 tablet 3  . glucagon (GLUCAGON EMERGENCY) 1 MG injection Inject 1 mg into the skin once as needed (for low blood sugar).     . Hyoscyamine Sulfate SL (LEVSIN/SL) 0.125 MG SUBL Place 0.125 mg under the tongue every 4 (four) hours as needed (bladder spasms). 1-2 TABS 40 each 1  . insulin glargine (LANTUS) 100 UNIT/ML injection Inject 5-20 Units into the skin See admin instructions. Inject 20 units SQ every morning and inject 5 units SQ at bedtime depending on blood sugar level    .  levofloxacin (LEVAQUIN) 500 MG tablet Take 1 tablet by mouth daily.  0  . losartan (COZAAR) 50 MG tablet Take 25 mg by mouth every morning.     . Multiple Vitamin (MULTIVITAMIN WITH MINERALS) TABS tablet Take 1 tablet by mouth daily. Alive    . naproxen sodium (ANAPROX) 220 MG tablet Take 220 mg by mouth 2 (two) times daily as needed (for pain or headache).     . nitroGLYCERIN (NITROSTAT) 0.4 MG SL tablet Place 0.4 mg under the tongue every 5 (five) minutes as needed for chest pain. Do not exceed 3 doses in 15 minutes.     Marland Kitchen NOVOLOG 100 UNIT/ML injection Inject 0-10 units SQ 3 times daily with meals per sliding scale  0  . vitamin B-12 (CYANOCOBALAMIN) 500 MCG tablet Take 500 mcg by mouth daily.    . clopidogrel (PLAVIX) 75 MG tablet Take 75 mg by mouth daily.    Marland Kitchen ibuprofen (ADVIL,MOTRIN) 800 MG tablet Take 1 tablet (800 mg total) by mouth every 8 (eight) hours as needed. (Patient not taking: Reported on 09/01/2018) 30 tablet 0  . metroNIDAZOLE (FLAGYL) 500 MG tablet Take 1 tablet by mouth 3 (three) times daily.  0   No current facility-administered medications for this visit.      Abtx:  Anti-infectives (From admission, onward)   None      REVIEW OF SYSTEMS:  Const: negative fever, negative chills, negative weight loss Eyes: negative diplopia or visual changes, negative eye pain ENT: negative coryza, negative sore throat Resp: negative cough, hemoptysis, dyspnea Cards: negative for chest pain, palpitations, lower extremity edema GU: negative for frequency, dysuria and hematuria GI: Negative for abdominal pain, had diarrhea, bleeding, constipation Skin: Wounds on his right leg Heme: negative for easy bruising and gum/nose bleeding MS: negative for myalgias, arthralgias, back pain and muscle weakness Neurolo:negative for headaches, dizziness, vertigo, memory problems  Psych: negative for feelings of anxiety, depression  Endocrine: Has polyuria  allergy/Immunology- negative for any  medication or food allergies  Objective:  VITALS:  BP 128/75   Pulse 79   Temp 97.6 F (36.4 C) (Oral)  PHYSICAL EXAM:  General: Alert, cooperative, no distress, appears stated age.  Head: Normocephalic, without obvious abnormality, atraumatic. Eyes: Conjunctivae clear, anicteric sclerae. Pupils are equal ENT Nares normal. No drainage or sinus tenderness. Lips, mucosa, and tongue normal. No Thrush Neck: Supple, symmetrical, no adenopathy, thyroid: non tender no carotid bruit and no JVD. Back: No CVA tenderness. Lungs: Clear to auscultation bilaterally. No Wheezing or Rhonchi. No rales. Heart: Regular rate and rhythm, no murmur, rub or gallop. Abdomen: Soft, non-tender,not distended. Bowel sounds normal. No masses  Extremities: Left great toe slightly bigger than the right great toe. Healed wound.  Scab on the tip of the toe I took a picture of the great left great toe with haiku but unfortunately did not save Skin: No rashes or lesions. Or bruising Lymph: Cervical, supraclavicular normal. Neurologic: Grossly non-focal  No labs available  IMAGING RESULTS: ? Impression/Recommendation ?72 y.o. male with a history of DM Type 1,, CAD s/p LAD stent ,bladder cancer treated with mitomycin completed treatment sept 2019 Sustained an injury to the left great toe by hitting on the kitchen table 1st week of September. He was followed by podiatist, Dr.Cline and then went to see wound care physician for a wound on the rt leg.  He took an xray of the left foot on 08/17/2018 and found erosion of the tuft of distal phalanx of the left great toe.  Left great toe wound.  Because of trauma .  Has healed completely.  Followed at wound care clinic and has been on Levaquin and Flagyl for 2 weeks now.  X-ray was concerning for osteomyelitis of the distal phalanx of the left great toe.  There is no bone culture or bone biopsy available.  Nor do I have pictures of the wound prior to healing..But clinically  does not fit with  osteomyelitis-like picture as the wound has healed completely in 2 weeks.  At this moment he does not need any IV antibiotics.    He may not need further oral antibiotics. Patient prefers to take another week or so of antibiotic.  He has an appointment to follow-up with wound care tomorrow.  Will leave it to Dr. Dellia Nims to decide whether to continue Levaquin/flagyl .   If there is a plan to continue antibiotics patient will need lab work to check liver, kidney and CBC.    If the wound recurs in the future he will need bone biopsy and bone culture to confirm infection and direct antibiotics accordingly.  ? ? _Type I diabetes mellitus on insulin.  Levaquin has the propensity to cause either hyperglycemia or hypoglycemia.  Patient is aware and has been following his sugars closely.  CAD status post stent currently on aspirin  Hyperlipidemia on atorvastatin and Zetia __________________________________________________ Discussed with patient in great detail.  Advised proper footwear.  Follow-up if needed

## 2018-09-01 NOTE — Patient Instructions (Addendum)
You have been referred to me for xray changes of your left great toe where you had an ulcer ( after an injury) before- currently the wound has healed completely after starting levaquin and flagyl. I dont see any evidence of remnant infection. You dont need any Iv antibiotics currently- You may not need any further antibiotics as clinically your toe is not behaving like osteomyeitis as the wound does not close quickly if there is bone infection.  But  you can discuss with your wound care doctor if you want to take another week or so of antibiotics. I will leave it to the discretion of your wound doctor. If you are continuing  antibiotics your doctor will have to check some labs CBC/CMP/ESR.You also seem to be have fluctuating blood sugar In the future if there is a wound a culture from the deep part of the wound/bonewill have to be done  before starting the appropriate antibiotic

## 2018-09-02 ENCOUNTER — Encounter: Payer: Medicare HMO | Admitting: Internal Medicine

## 2018-09-02 DIAGNOSIS — I252 Old myocardial infarction: Secondary | ICD-10-CM | POA: Diagnosis not present

## 2018-09-02 DIAGNOSIS — S91102A Unspecified open wound of left great toe without damage to nail, initial encounter: Secondary | ICD-10-CM | POA: Diagnosis not present

## 2018-09-02 DIAGNOSIS — E1051 Type 1 diabetes mellitus with diabetic peripheral angiopathy without gangrene: Secondary | ICD-10-CM | POA: Diagnosis not present

## 2018-09-02 DIAGNOSIS — L97211 Non-pressure chronic ulcer of right calf limited to breakdown of skin: Secondary | ICD-10-CM | POA: Diagnosis not present

## 2018-09-02 DIAGNOSIS — I70203 Unspecified atherosclerosis of native arteries of extremities, bilateral legs: Secondary | ICD-10-CM | POA: Diagnosis not present

## 2018-09-02 DIAGNOSIS — L97523 Non-pressure chronic ulcer of other part of left foot with necrosis of muscle: Secondary | ICD-10-CM | POA: Diagnosis not present

## 2018-09-02 DIAGNOSIS — M86672 Other chronic osteomyelitis, left ankle and foot: Secondary | ICD-10-CM | POA: Diagnosis not present

## 2018-09-02 DIAGNOSIS — E10622 Type 1 diabetes mellitus with other skin ulcer: Secondary | ICD-10-CM | POA: Diagnosis not present

## 2018-09-02 DIAGNOSIS — E1069 Type 1 diabetes mellitus with other specified complication: Secondary | ICD-10-CM | POA: Diagnosis not present

## 2018-09-02 DIAGNOSIS — E10621 Type 1 diabetes mellitus with foot ulcer: Secondary | ICD-10-CM | POA: Diagnosis not present

## 2018-09-03 NOTE — Progress Notes (Addendum)
NEILS, SIRACUSA ([id number]) Visit Report for 09/02/2018 HPI Details Patient Name: [name], [name] Date of Service: 09/02/2018 3:30 PM Medical Record Number: [id number] Patient Account Number: [id number] Date of Birth/Sex: 10-11-1946 (72 y.o. M) Treating RN: Cornell Barman Primary Care Provider: [name] Other Clinician: Referring Provider: [name] Treating Provider/Extender: Tito Dine in Treatment: 3 History of Present Illness Location: right anterior shin Quality: Patient reports No Pain. Severity: Patient states wound are getting worse. Duration: Patient has had the wound for > 6 months prior to seeking treatment at the wound center Context: The wound appeared gradually over time Modifying Factors: Other treatment(s) tried include: taken doxycycline and use bacitracin Associated Signs and Symptoms: Patient reports presence of swelling HPI Description: 72 year old male recently seen by his PCP Dr. [name] who saw him for a wound on the right leg which is less tender and he is continuing to use bacitracin and has completed her course of doxycycline. Past medical history of coronary artery disease, diabetes mellitus type 1, hyperlipidemia, hypertension, MI, plantar fascial fibromatosis, pneumonia, status post foot surgery due to trauma on the left side, coronary angioplasty. he is a former smoker and quit in July 1996. last hemoglobin A1c was 7.8 % in April of this year 5/22 2017 -- biopsy of the right lower extremity was sent for pathology and the report is that of a basal cell carcinoma with edges of the biopsy are involved. I have called the patient over the phone( 03/28/16) and discussed the pathology report with him and he is agreeable to see a surgeon for excision and need full. I have also spoken personally to Dr. [name], of [facility] surgical and referred the patient for further wide excision of this lesion and have communicated this to the  patient. READMISSION 08/12/18 This is a now 72 year old man with type 1 diabetes. Hemoglobin A1c on 4/29 was 7.9. He is listed as having a history of PAD in the [place] records although the patient doesn't recall this, doesn't complain of any symptoms of claudication and doesn't believe he has had any prior noninvasive arterial test. He is a nonsmoker. He was here in 2017 with a wound on his Right lower leg. This was biopsied in the clinic and pathology showed a basal cell carcinoma. He went on to have surgery on this area this is currently where I believe his current wounds are located. He generally bumped the lateral part of his right calf 2 weeks ago and has a superficial abrasion. He also has 2 small open areas on the medial part of the tibia in the same area. Our intake nurse noted a stitch coming out of this which was removed. These of the wound sees actually come to the clinic for. However the more concerning area is an area on the tip of the left great toe. He says that this was initially traumatic and he's been to see Dr. [name] podiatrist. [name] been using what I think is Santyl to the wound tip. This has some depth. ABIs in this clinic were non-obtainable on the right and 1.84 on the left. 08/19/18; the patient had arterial studies that did not show evidence of significant hemodynamically compromising occlusions in either leg. There was mild medial atherosclerosis bilaterally. His resting ABIs were within the normal limits at 1.3 on the right and 1.4 on the left. There was normal posterior and anterior tibial artery waveforms and velocities listed on both sides. The patient arrives with what appears to be a  healthy surface over the tip of his left great toe. This is indeed surprising. Still looks somewhat friable however. More concerning is the x-ray that we did that showed erosions of the tuft of the distal Colleran, [name] ([id number]) phalanx the left great toe consistent  with osteomyelitis. I attempted to pull this x-ray up on colon health Link however I can't open the film to look at this myself. 08/26/18; I reviewed the patient's x-ray and colon helpline. Indeed there is fairly obvious erosion of the tip of his left great toe. The wound was initially a traumatic area hitting it sometime in the middle of night in his kitchen. He is a type I diabetic. I have him on Flagyl and Levaquin that I started last week i.e. 7 days ago. He has an appointment with infectious disease on 09/01/18 09/02/18; the patient was seen by Dr. Delaine Lame of infectious disease. She did not feel the patient needed IV antibiotics. I do feel he needs further oral antibiotics however. He does not need anymore Flagyl but he does need another 2 weeks of Levaquin. The areas just about closed. Electronic Signature(s) Signed: 09/02/2018 4:09:45 PM By: Linton Ham MD Entered By: Linton Ham on 09/02/2018 15:50:21 [name], [name] ([id number]) -------------------------------------------------------------------------------- Physical Exam Details Patient Name: [name], [name] Date of Service: 09/02/2018 3:30 PM Medical Record Number: [id number] Patient Account Number: [id number] Date of Birth/Sex: June 23, 1946 (72 y.o. M) Treating RN: Cornell Barman Primary Care Provider: [name] Other Clinician: Referring Provider: [name] Treating Provider/Extender: Tito Dine in Treatment: 3 Constitutional Sitting or standing Blood Pressure is within target range for patient.. Pulse regular and within target range for patient.Marland Kitchen Respirations regular, non-labored and within target range.. Temperature is normal and within the target range for the patient.Marland Kitchen appears in no distress. Notes Wound exam; the area on the tip of the toe was epithelialized but appears very fragile there appeared to be some leaking fluid saw I'm not 100% sure this is actually totally closed  nevertheless it is very close there is no surrounding erythema no swelling. Electronic Signature(s) Signed: 09/02/2018 4:09:45 PM By: Linton Ham MD Entered By: Linton Ham on 09/02/2018 15:58:26 [name], [name] ([id number]) -------------------------------------------------------------------------------- Physician Orders Details Patient Name: [name], [name] Date of Service: 09/02/2018 3:30 PM Medical Record Number: [id number] Patient Account Number: [id number] Date of Birth/Sex: 24-Dec-1945 (72 y.o. M) Treating RN: Cornell Barman Primary Care Provider: [name] Other Clinician: Referring Provider: [name] Treating Provider/Extender: Tito Dine in Treatment: 3 Verbal / Phone Orders: No Diagnosis Coding Wound Cleansing Wound #3 Left Toe Great o Cleanse wound with mild soap and water o May shower with protection. Anesthetic (add to Medication List) Wound #3 Left Toe Great o Topical Lidocaine 4% cream applied to wound bed prior to debridement (In Clinic Only). Primary Wound Dressing Wound #3 Left Toe Great o Silver Alginate Secondary Dressing Wound #3 Left Toe Great o Gauze and Kerlix/Conform Dressing Change Frequency Wound #3 Left Toe Great o Change dressing every other day. Follow-up Appointments Wound #3 Left Toe Great o Return Appointment in 2 weeks. Medications-please add to medication list. Wound #3 Left Toe Great o P.O. Antibiotics - continue antibiotics Patient Medications Allergies: No Known Drug Intolerances Notifications Medication Indication Start End Levaquin osteomyelitis left 09/02/2018 first toe DOSE oral 500 mg tablet - 1 tablet oral qd for an additional 2 weeks Electronic Signature(s) [name], [name] ([id number]) Signed: 09/02/2018 4:02:05 PM By: Linton Ham MD Entered By: Linton Ham on  09/02/2018 16:02:04 [name], [name]  ([id number]) -------------------------------------------------------------------------------- Problem List Details Patient Name: [name], [name] Date of Service: 09/02/2018 3:30 PM Medical Record Number: [id number] Patient Account Number: [id number] Date of Birth/Sex: [birth year] (72 y.o. M) Treating RN: Cornell Barman Primary Care Provider: [name] Other Clinician: Referring Provider: [name] Treating Provider/Extender: Tito Dine in Treatment: 3 Active Problems ICD-10 Evaluated Encounter Code Description Active Date Today Diagnosis E10.621 Type 1 diabetes mellitus with foot ulcer 08/12/2018 No Yes L97.523 Non-pressure chronic ulcer of other part of left foot with 08/12/2018 No Yes necrosis of muscle L97.211 Non-pressure chronic ulcer of right calf limited to breakdown 08/12/2018 No Yes of skin E10.51 Type 1 diabetes mellitus with diabetic peripheral angiopathy 08/12/2018 No Yes without gangrene G29.528 Other chronic osteomyelitis, left ankle and foot 08/19/2018 No Yes Inactive Problems Resolved Problems Electronic Signature(s) Signed: 09/02/2018 4:09:45 PM By: Linton Ham MD Entered By: Linton Ham on 09/02/2018 15:48:56 [name], [name] ([id number]) -------------------------------------------------------------------------------- Progress Note Details Patient Name: [name], [name] Date of Service: 09/02/2018 3:30 PM Medical Record Number: [id number] Patient Account Number: [id number] Date of Birth/Sex: [birth year] (72 y.o. M) Treating RN: Cornell Barman Primary Care Provider: [name] Other Clinician: Referring Provider: [name] Treating Provider/Extender: Tito Dine in Treatment: 3 Subjective History of Present Illness (HPI) The following HPI elements were documented for the patient's wound: Location: right anterior shin Quality: Patient reports No Pain. Severity: Patient states wound are getting  worse. Duration: Patient has had the wound for > 6 months prior to seeking treatment at the wound center Context: The wound appeared gradually over time Modifying Factors: Other treatment(s) tried include: taken doxycycline and use bacitracin Associated Signs and Symptoms: Patient reports presence of swelling 72 year old male recently seen by his PCP Dr. [name] who saw him for a wound on the right leg which is less tender and he is continuing to use bacitracin and has completed her course of doxycycline. Past medical history of coronary artery disease, diabetes mellitus type 1, hyperlipidemia, hypertension, MI, plantar fascial fibromatosis, pneumonia, status post foot surgery due to trauma on the left side, coronary angioplasty. he is a former smoker and quit in July 1996. last hemoglobin A1c was 7.8 % in April of this year 5/22 2017 -- biopsy of the right lower extremity was sent for pathology and the report is that of a basal cell carcinoma with edges of the biopsy are involved. I have called the patient over the phone( 03/28/16) and discussed the pathology report with him and he is agreeable to see a surgeon for excision and need full. I have also spoken personally to Dr. [name], of [facility] surgical and referred the patient for further wide excision of this lesion and have communicated this to the patient. READMISSION 08/12/18 This is a now 72 year old man with type 1 diabetes. Hemoglobin A1c on 4/29 was 7.9. He is listed as having a history of PAD in the [place] records although the patient doesn't recall this, doesn't complain of any symptoms of claudication and doesn't believe he has had any prior noninvasive arterial test. He is a nonsmoker. He was here in 2017 with a wound on his Right lower leg. This was biopsied in the clinic and pathology showed a basal cell carcinoma. He went on to have surgery on this area this is currently where I believe his current wounds are located. He  generally bumped the lateral part of his right calf 2 weeks ago and has a superficial abrasion.  He also has 2 small open areas on the medial part of the tibia in the same area. Our intake nurse noted a stitch coming out of this which was removed. These of the wound sees actually come to the clinic for. However the more concerning area is an area on the tip of the left great toe. He says that this was initially traumatic and he's been to see Dr. [name] podiatrist. [name] been using what I think is Santyl to the wound tip. This has some depth. ABIs in this clinic were non-obtainable on the right and 1.84 on the left. 08/19/18; the patient had arterial studies that did not show evidence of significant hemodynamically compromising occlusions in either leg. There was mild medial atherosclerosis bilaterally. His resting ABIs were within the normal limits at 1.3 on the right and 1.4 on the left. There was normal posterior and anterior tibial artery waveforms and velocities listed on both sides. The patient arrives with what appears to be a healthy surface over the tip of his left great toe. This is indeed surprising. Still looks somewhat friable however. More concerning is the x-ray that we did that showed erosions of the tuft of the distal Axon, Yader H. ([id number]) phalanx the left great toe consistent with osteomyelitis. I attempted to pull this x-ray up on colon health Link however I can't open the film to look at this myself. 08/26/18; I reviewed the patient's x-ray and colon helpline. Indeed there is fairly obvious erosion of the tip of his left great toe. The wound was initially a traumatic area hitting it sometime in the middle of night in his kitchen. He is a type I diabetic. I have him on Flagyl and Levaquin that I started last week i.e. 7 days ago. He has an appointment with infectious disease on 09/01/18 09/02/18; the patient was seen by Dr. Delaine Lame of infectious disease. She did  not feel the patient needed IV antibiotics. I do feel he needs further oral antibiotics however. He does not need anymore Flagyl but he does need another 2 weeks of Levaquin. The areas just about closed. Objective Constitutional Sitting or standing Blood Pressure is within target range for patient.. Pulse regular and within target range for patient.Marland Kitchen Respirations regular, non-labored and within target range.. Temperature is normal and within the target range for the patient.Marland Kitchen appears in no distress. Vitals Time Taken: 3:23 PM, Height: 69 in, Weight: 183.2 lbs, BMI: 27.1, Temperature: 97.8 F, Pulse: 79 bpm, Respiratory Rate: 16 breaths/min, Blood Pressure: 123/66 mmHg. General Notes: Wound exam; the area on the tip of the toe was epithelialized but appears very fragile there appeared to be some leaking fluid saw I'm not 100% sure this is actually totally closed nevertheless it is very close there is no surrounding erythema no swelling. Integumentary (Hair, Skin) Wound #3 status is Open. Original cause of wound was Gradually Appeared. The wound is located on the Left Toe Great. The wound measures 0.1cm length x 0.2cm width x 0.1cm depth; 0.016cm^2 area and 0.002cm^3 volume. There is Fat Layer (Subcutaneous Tissue) Exposed exposed. There is no tunneling or undermining noted. There is a medium amount of serous drainage noted. The wound margin is flat and intact. There is large (67-100%) granulation within the wound bed. There is no necrotic tissue within the wound bed. The periwound skin appearance exhibited: Callus. The periwound skin appearance did not exhibit: Crepitus, Excoriation, Induration, Rash, Scarring, Dry/Scaly, Maceration, Atrophie Blanche, Cyanosis, Ecchymosis, Hemosiderin Staining, Mottled, Pallor, Rubor, Erythema. Periwound temperature  was noted as No Abnormality. Assessment Active Problems ICD-10 Type 1 diabetes mellitus with foot ulcer Non-pressure chronic ulcer of other part  of left foot with necrosis of muscle Non-pressure chronic ulcer of right calf limited to breakdown of skin Type 1 diabetes mellitus with diabetic peripheral angiopathy without gangrene Other chronic osteomyelitis, left ankle and foot [name], [name] ([id number]) Plan Wound Cleansing: Wound #3 Left Toe Great: Cleanse wound with mild soap and water May shower with protection. Anesthetic (add to Medication List): Wound #3 Left Toe Great: Topical Lidocaine 4% cream applied to wound bed prior to debridement (In Clinic Only). Primary Wound Dressing: Wound #3 Left Toe Great: Silver Alginate Secondary Dressing: Wound #3 Left Toe Great: Gauze and Kerlix/Conform Dressing Change Frequency: Wound #3 Left Toe Great: Change dressing every other day. Follow-up Appointments: Wound #3 Left Toe Great: Return Appointment in 2 weeks. Medications-please add to medication list.: Wound #3 Left Toe Great: P.O. Antibiotics - continue antibiotics The following medication(s) was prescribed: Levaquin oral 500 mg tablet 1 tablet oral qd for an additional 2 weeks for osteomyelitis left first toe starting 09/02/2018 #1 change the primary dressing from Silver collagen to silver alginate #22 weeks of additional Levaquin. The Flagyl can stop at this point Electronic Signature(s) Signed: 09/02/2018 4:43:53 PM By: Gretta Cool, BSN, RN, CWS, Kim RN, BSN Signed: 09/09/2018 5:32:05 PM By: Linton Ham MD Previous Signature: 09/02/2018 4:09:45 PM Version By: Linton Ham MD Entered By: Gretta Cool, BSN, RN, CWS, Kim on 09/02/2018 16:43:52 [place], [name] ([id number]) -------------------------------------------------------------------------------- SuperBill Details Patient Name: Dorgan, Sargent H. Date of Service: 09/02/2018 Medical Record Number: [id number] Patient Account Number: [id number] Date of Birth/Sex: 21-Oct-1946 (72 y.o. M) Treating RN: Cornell Barman Primary Care Provider: [name] Other  Clinician: Referring Provider: [name] Treating Provider/Extender: Tito Dine in Treatment: 3 Diagnosis Coding ICD-10 Codes Code Description [phone] Type 1 diabetes mellitus with foot ulcer L97.523 Non-pressure chronic ulcer of other part of left foot with necrosis of muscle L97.211 Non-pressure chronic ulcer of right calf limited to breakdown of skin E10.51 Type 1 diabetes mellitus with diabetic peripheral angiopathy without gangrene M86.672 Other chronic osteomyelitis, left ankle and foot Facility Procedures CPT4 Code: [id number] Description: [phone] - WOUND CARE VISIT-LEV 2 EST PT Modifier: Quantity: 1 Physician Procedures CPT4 Code: [id number] Description: 38333 - WC PHYS LEVEL 2 - EST PT ICD-10 Diagnosis Description M86.672 Other chronic osteomyelitis, left ankle and foot E10.621 Type 1 diabetes mellitus with foot ulcer Modifier: Quantity: 1 Electronic Signature(s) Signed: 09/02/2018 4:09:45 PM By: Linton Ham MD Entered By: Linton Ham on 09/02/2018 16:02:55

## 2018-09-04 NOTE — Progress Notes (Signed)
MANRAJ, YEO ([id number]) Visit Report for 09/02/2018 Arrival Information Details Patient Name: [name], [name] Date of Service: 09/02/2018 3:30 PM Medical Record Number: [id number] Patient Account Number: [id number] Date of Birth/Sex: October 01, 1946 (72 y.o. M) Treating RN: Secundino Ginger Primary Care Jakyri Brunkhorst: [name] Other Clinician: Referring Tezra Mahr: [name] Treating Tekoa Hamor/Extender: Tito Dine in Treatment: 3 Visit Information History Since Last Visit Added or deleted any medications: No Patient Arrived: Ambulatory Any new allergies or adverse reactions: No Arrival Time: 15:22 Had a fall or experienced change in No Accompanied By: self activities of daily living that may affect Transfer Assistance: None risk of falls: Patient Identification Verified: Yes Signs or symptoms of abuse/neglect since last visito No Secondary Verification Process Completed: Yes Hospitalized since last visit: No Implantable device outside of the clinic excluding No cellular tissue based products placed in the center since last visit: Has Dressing in Place as Prescribed: Yes Pain Present Now: No Electronic Signature(s) Signed: 09/02/2018 4:05:05 PM By: Secundino Ginger Entered By: Secundino Ginger on 09/02/2018 15:22:44 Curran, [name] ([id number]) -------------------------------------------------------------------------------- Clinic Level of Care Assessment Details Patient Name: [name], [name] Date of Service: 09/02/2018 3:30 PM Medical Record Number: [id number] Patient Account Number: [id number] Date of Birth/Sex: Dec 17, 1945 (72 y.o. M) Treating RN: Cornell Barman Primary Care Jaxan Michel: [name] Other Clinician: Referring Latiya Navia: [name] Treating Kasim Mccorkle/Extender: Tito Dine in Treatment: 3 Clinic Level of Care Assessment Items TOOL 4 Quantity Score []  - Use when only an EandM is performed on FOLLOW-UP visit 0 ASSESSMENTS -  Nursing Assessment / Reassessment []  - Reassessment of Co-morbidities (includes updates in patient status) 0 X- 1 5 Reassessment of Adherence to Treatment Plan ASSESSMENTS - Wound and Skin Assessment / Reassessment []  - Simple Wound Assessment / Reassessment - one wound 0 X- 1 5 Complex Wound Assessment / Reassessment - multiple wounds []  - 0 Dermatologic / Skin Assessment (not related to wound area) ASSESSMENTS - Focused Assessment []  - Circumferential Edema Measurements - multi extremities 0 []  - 0 Nutritional Assessment / Counseling / Intervention []  - 0 Lower Extremity Assessment (monofilament, tuning fork, pulses) []  - 0 Peripheral Arterial Disease Assessment (using hand held doppler) ASSESSMENTS - Ostomy and/or Continence Assessment and Care []  - Incontinence Assessment and Management 0 []  - 0 Ostomy Care Assessment and Management (repouching, etc.) PROCESS - Coordination of Care X - Simple Patient / Family Education for ongoing care 1 15 []  - 0 Complex (extensive) Patient / Family Education for ongoing care []  - 0 Staff obtains Programmer, systems, Records, Test Results / Process Orders []  - 0 Staff telephones HHA, Nursing Homes / Clarify orders / etc []  - 0 Routine Transfer to another Facility (non-emergent condition) []  - 0 Routine Hospital Admission (non-emergent condition) []  - 0 New Admissions / Biomedical engineer / Ordering NPWT, Apligraf, etc. []  - 0 Emergency Hospital Admission (emergent condition) X- 1 10 Simple Discharge Coordination Jorgenson, Elonzo H. ([id number]) []  - 0 Complex (extensive) Discharge Coordination PROCESS - Special Needs []  - Pediatric / Minor Patient Management 0 []  - 0 Isolation Patient Management []  - 0 Hearing / Language / Visual special needs []  - 0 Assessment of Community assistance (transportation, D/C planning, etc.) []  - 0 Additional assistance / Altered mentation []  - 0 Support Surface(s) Assessment (bed, cushion, seat,  etc.) INTERVENTIONS - Wound Cleansing / Measurement X - Simple Wound Cleansing - one wound 1 5 []  - 0 Complex Wound Cleansing - multiple wounds X- 1 5 Wound Imaging (photographs -  any number of wounds) []  - 0 Wound Tracing (instead of photographs) X- 1 5 Simple Wound Measurement - one wound []  - 0 Complex Wound Measurement - multiple wounds INTERVENTIONS - Wound Dressings []  - Small Wound Dressing one or multiple wounds 0 X- 1 15 Medium Wound Dressing one or multiple wounds []  - 0 Large Wound Dressing one or multiple wounds []  - 0 Application of Medications - topical []  - 0 Application of Medications - injection INTERVENTIONS - Miscellaneous []  - External ear exam 0 []  - 0 Specimen Collection (cultures, biopsies, blood, body fluids, etc.) []  - 0 Specimen(s) / Culture(s) sent or taken to Lab for analysis []  - 0 Patient Transfer (multiple staff / Civil Service fast streamer / Similar devices) []  - 0 Simple Staple / Suture removal (25 or less) []  - 0 Complex Staple / Suture removal (26 or more) []  - 0 Hypo / Hyperglycemic Management (close monitor of Blood Glucose) []  - 0 Ankle / Brachial Index (ABI) - do not check if billed separately X- 1 5 Vital Signs Stennis, Angela H. ([id number]) Has the patient been seen at the hospital within the last three years: Yes Total Score: 70 Level Of Care: New/Established - Level 2 Electronic Signature(s) Signed: 09/02/2018 4:45:51 PM By: Gretta Cool, BSN, RN, CWS, Kim RN, BSN Entered By: Gretta Cool, BSN, RN, CWS, Kim on 09/02/2018 15:50:00 Luce, [name] ([id number]) -------------------------------------------------------------------------------- Encounter Discharge Information Details Patient Name: [name], [name] Date of Service: 09/02/2018 3:30 PM Medical Record Number: [id number] Patient Account Number: [id number] Date of Birth/Sex: [birth year] (72 y.o. M) Treating RN: Cornell Barman Primary Care Deidra Spease: [name] Other  Clinician: Referring Jeevan Kalla: [name] Treating Branae Crail/Extender: Tito Dine in Treatment: 3 Encounter Discharge Information Items Discharge Condition: Stable Ambulatory Status: Ambulatory Discharge Destination: Home Transportation: Private Auto Accompanied By: self Schedule Follow-up Appointment: Yes Clinical Summary of Care: Electronic Signature(s) Signed: 09/02/2018 4:42:31 PM By: Gretta Cool, BSN, RN, CWS, Kim RN, BSN Entered By: Gretta Cool, BSN, RN, CWS, Kim on 09/02/2018 16:42:30 Koziel, [name] ([id number]) -------------------------------------------------------------------------------- Lower Extremity Assessment Details Patient Name: [name], [name] Date of Service: 09/02/2018 3:30 PM Medical Record Number: [id number] Patient Account Number: [id number] Date of Birth/Sex: 04-Jul-1946 (72 y.o. M) Treating RN: Secundino Ginger Primary Care Lilinoe Acklin: [name] Other Clinician: Referring Hilarie Sinha: [name] Treating Derreck Wiltsey/Extender: Tito Dine in Treatment: 3 Electronic Signature(s) Signed: 09/02/2018 4:05:05 PM By: Secundino Ginger Entered By: Secundino Ginger on 09/02/2018 15:22:01 Norton, [name] ([id number]) -------------------------------------------------------------------------------- Multi Wound Chart Details Patient Name: [name], [name] Date of Service: 09/02/2018 3:30 PM Medical Record Number: [id number] Patient Account Number: [id number] Date of Birth/Sex: 05-16-1946 (72 y.o. 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] Comorbid History: [3:Coronary Artery Disease, Hypertension, Type I Diabetes, Osteoarthritis, Received Chemotherapy] [N/A:N/A] Date Acquired: [3:07/20/2018] [N/A:N/A] Weeks of Treatment: [3:3] [N/A:N/A] Wound Status: [3:Open] [N/A:N/A] Pending Amputation on [3:Yes] [N/A:N/A] Presentation: Measurements L x W x D [3:0.1x0.1x0.1] [N/A:N/A] (cm) Area (cm) : [3:0.008] [N/A:N/A] Volume (cm) : [3:0.001] [N/A:N/A] % Reduction in Area: [3:94.90%] [N/A:N/A] % Reduction in Volume: [3:96.80%] [N/A:N/A] Classification: [3:Grade 3] [N/A:N/A] Exudate Amount: [3:None Present] [N/A:N/A] Wound Margin: [3:Flat and Intact] [  N/A:N/A] Granulation Amount: [3:None Present (0%)] [N/A:N/A] Necrotic Amount: [3:None Present (0%)] [N/A:N/A] Exposed Structures: [3:Fat Layer (Subcutaneous Tissue) Exposed: Yes Fascia: No Tendon: No Muscle: No Joint: No Bone: No] [N/A:N/A] Epithelialization: [3:None] [N/A:N/A] Periwound Skin Texture: [3:Callus: Yes Excoriation: No Induration: No Crepitus: No] [N/A:N/A] Rash: No Scarring: No Periwound Skin Moisture: Maceration: No N/A N/A Dry/Scaly: No Periwound Skin Color: Atrophie Blanche: No N/A N/A Cyanosis: No Ecchymosis: No Erythema: No Hemosiderin Staining: No Mottled: No Pallor: No Rubor: No Temperature: No Abnormality N/A N/A Tenderness on Palpation: No N/A N/A Wound Preparation: Ulcer Cleansing: N/A N/A Rinsed/Irrigated with Saline Topical Anesthetic Applied: Other: lidocaine 4% Treatment Notes Electronic Signature(s) Signed: 09/02/2018 4:09:45 PM By: Linton Ham MD Entered By: Linton Ham on 09/02/2018 15:49:05 [name], [name] ([id number]) -------------------------------------------------------------------------------- Multi-Disciplinary Care Plan Details Patient Name: [name], [name] Date of Service: 09/02/2018 3:30  PM Medical Record Number: [id number] Patient Account Number: [id number] Date of Birth/Sex: 1946/06/12 (72 y.o. M) Treating RN: Cornell Barman Primary Care Lainee Lehrman: [name] Other Clinician: Referring Davidlee Jeanbaptiste: [name] Treating Stormy Sabol/Extender: Tito Dine in Treatment: 3 Active Inactive ` Orientation to the Wound Care Program Nursing Diagnoses: Knowledge deficit related to the wound healing center program Goals: Patient/caregiver will verbalize understanding of the [facility] Date Initiated: 08/12/2018 Target Resolution Date: 09/12/2018 Goal Status: Active Interventions: Provide education on orientation to the wound center Notes: ` Osteomyelitis Nursing Diagnoses: Infection: osteomyelitis Goals: Patient's osteomyelitis will resolve Date Initiated: 08/19/2018 Target Resolution Date: 10/23/2018 Goal Status: Active Interventions: Assess for signs and symptoms of osteomyelitis resolution every visit Notes: ` Venous Leg Ulcer Nursing Diagnoses: Actual venous Insuffiency (use after diagnosis is confirmed) Goals: Patient will maintain optimal edema control Date Initiated: 08/12/2018 Target Resolution Date: 09/12/2018 Goal Status: Active Interventions: Frankum, Luismiguel H. ([id number]) Assess peripheral edema status every visit. Notes: ` Wound/Skin Impairment Nursing Diagnoses: Impaired tissue integrity Goals: Ulcer/skin breakdown will have a volume reduction of 30% by week 4 Date Initiated: 08/12/2018 Target Resolution Date: 09/12/2018 Goal Status: Active Interventions: Assess ulceration(s) every visit Treatment Activities: Topical wound management initiated : 08/12/2018 Notes: Electronic Signature(s) Signed: 09/02/2018 4:45:51 PM By: Gretta Cool, BSN, RN, CWS, Kim RN, BSN Entered By: Gretta Cool, BSN, RN, CWS, Kim on 09/02/2018 15:37:16 [name], [name]  ([id number]) -------------------------------------------------------------------------------- Pain Assessment Details Patient Name: [name], [name] Date of Service: 09/02/2018 3:30 PM Medical Record Number: [id number] Patient Account Number: [id number] Date of Birth/Sex: 1946-02-14 (72 y.o. M) Treating RN: Secundino Ginger Primary Care Opha Mcghee: [name] Other Clinician: Referring Valen Mascaro: [name] Treating Jamus Loving/Extender: Tito Dine in Treatment: 3 Active Problems Location of Pain Severity and Description of Pain Patient Has Paino No Site Locations Pain Management and Medication Current Pain Management: Goals for Pain Management pt denies any pain at this time. Electronic Signature(s) Signed: 09/02/2018 4:05:05 PM By: Secundino Ginger Entered By: Secundino Ginger on 09/02/2018 15:23:10 [name], [name] ([id number]) -------------------------------------------------------------------------------- Patient/Caregiver Education Details Patient Name: [name]. Date of Service: 09/02/2018 3:30 PM Medical Record Number: [id number] Patient Account Number: [id number] Date of Birth/Gender: 1946-03-16 (72 y.o. M) Treating RN: Cornell Barman Primary Care Physician: [name] Other Clinician: Referring Physician: [name] Treating Physician/Extender: Tito Dine in Treatment: 3 Education Assessment Education Provided To: Patient Education Topics Provided Wound/Skin Impairment: Handouts: Caring for Your Ulcer Methods: Demonstration, Explain/Verbal Responses: State content correctly Electronic Signature(s) Signed: 09/02/2018 4:45:51 PM By: Gretta Cool, BSN, RN, CWS, Kim RN, BSN Entered By: Gretta Cool, BSN, RN, CWS, Kim on 09/02/2018 15:50:14 [name], [name] Boll  H. ([id number]) -------------------------------------------------------------------------------- Wound Assessment Details Patient Name: [name], [name] Date of Service: 09/02/2018  3:30 PM Medical Record Number: [id number] Patient Account Number: [id number] Date of Birth/Sex: [birth year] (72 y.o. M) Treating RN: Cornell Barman Primary Care Onnie Hatchel: [name] Other Clinician: Referring Venna Berberich: [name] Treating Elchonon Maxson/Extender: Tito Dine in Treatment: 3 Wound Status Wound Number: 3 Primary Diabetic Wound/Ulcer of the Lower Extremity Etiology: Wound Location: Left Toe Great Wound Open Wounding Event: Gradually Appeared Status: Date Acquired: 07/20/2018 Comorbid Coronary Artery Disease, Hypertension, Type I Weeks Of Treatment: 3 History: Diabetes, Osteoarthritis, Received Clustered Wound: No Chemotherapy Pending Amputation On Presentation Wound under treatment by Kiam Bransfield outside of [place] [facility] Wound Measurements Length: (cm) 0.1 Width: (cm) 0.2 Depth: (cm) 0.1 Area: (cm) 0.016 Volume: (cm) 0.002 % Reduction in Area: 89.8% % Reduction in Volume: 93.5% Epithelialization: None Tunneling: No Undermining: No Wound Description Classification: Grade 3 Wound Margin: Flat and Intact Exudate Amount: Medium Exudate Type: Serous Exudate Color: amber Foul Odor After Cleansing: No Slough/Fibrino No Wound Bed Granulation Amount: Large (67-100%) Exposed Structure Necrotic Amount: None Present (0%) Fascia Exposed: No Fat Layer (Subcutaneous Tissue) Exposed: Yes Tendon Exposed: No Muscle Exposed: No Joint Exposed: No Bone Exposed: No Trettin, Cassey H. ([id number]) Periwound Skin Texture Texture Color No Abnormalities Noted: No No Abnormalities Noted: No Callus: Yes Atrophie Blanche: No Crepitus: No Cyanosis: No Excoriation: No Ecchymosis: No Induration: No Erythema: No Rash: No Hemosiderin Staining: No Scarring: No Mottled: No Pallor: No Moisture Rubor: No No Abnormalities Noted: No Dry / Scaly: No Temperature / Pain Maceration: No Temperature: No Abnormality Wound Preparation Ulcer Cleansing:  Rinsed/Irrigated with Saline Topical Anesthetic Applied: Other: lidocaine 4%, Treatment Notes Wound #3 (Left Toe Great) Notes silvercell, foam and coform Electronic Signature(s) Signed: 09/02/2018 4:43:34 PM By: Gretta Cool, BSN, RN, CWS, Kim RN, BSN Previous Signature: 09/02/2018 4:05:05 PM Version By: Secundino Ginger Entered By: Gretta Cool, BSN, RN, CWS, Kim on 09/02/2018 16:43:34 [name], [name] ([id number]) -------------------------------------------------------------------------------- Vitals Details Patient Name: [name], [name] Date of Service: 09/02/2018 3:30 PM Medical Record Number: [id number] Patient Account Number: [id number] Date of Birth/Sex: 09-Dec-1945 (72 y.o. M) Treating RN: Secundino Ginger Primary Care Ginette Bradway: [name] Other Clinician: Referring Fiza Nation: [name] Treating Saphyra Hutt/Extender: Tito Dine in Treatment: 3 Vital Signs Time Taken: 15:23 Temperature (F): 97.8 Height (in): 69 Pulse (bpm): 79 Weight (lbs): 183.2 Respiratory Rate (breaths/min): 16 Body Mass Index (BMI): 27.1 Blood Pressure (mmHg): 123/66 Reference Range: 80 - 120 mg / dl Electronic Signature(s) Signed: 09/02/2018 4:05:05 PM By: Secundino Ginger Entered By: Secundino Ginger on 09/02/2018 15:26:30

## 2018-09-10 DIAGNOSIS — E109 Type 1 diabetes mellitus without complications: Secondary | ICD-10-CM | POA: Diagnosis not present

## 2018-09-16 ENCOUNTER — Encounter: Payer: Medicare HMO | Attending: Internal Medicine | Admitting: Internal Medicine

## 2018-09-16 DIAGNOSIS — E1069 Type 1 diabetes mellitus with other specified complication: Secondary | ICD-10-CM | POA: Insufficient documentation

## 2018-09-16 DIAGNOSIS — M86672 Other chronic osteomyelitis, left ankle and foot: Secondary | ICD-10-CM | POA: Diagnosis not present

## 2018-09-16 DIAGNOSIS — E109 Type 1 diabetes mellitus without complications: Secondary | ICD-10-CM | POA: Diagnosis not present

## 2018-09-16 DIAGNOSIS — S91102A Unspecified open wound of left great toe without damage to nail, initial encounter: Secondary | ICD-10-CM | POA: Diagnosis not present

## 2018-09-16 DIAGNOSIS — L97523 Non-pressure chronic ulcer of other part of left foot with necrosis of muscle: Secondary | ICD-10-CM | POA: Diagnosis not present

## 2018-09-16 DIAGNOSIS — E1051 Type 1 diabetes mellitus with diabetic peripheral angiopathy without gangrene: Secondary | ICD-10-CM | POA: Insufficient documentation

## 2018-09-16 DIAGNOSIS — E10621 Type 1 diabetes mellitus with foot ulcer: Secondary | ICD-10-CM | POA: Diagnosis not present

## 2018-09-19 NOTE — Progress Notes (Signed)
[name], [name] ([id number]) Visit Report for 09/16/2018 Arrival Information Details Patient Name: [name], [name] Date of Service: 09/16/2018 3:30 PM Medical Record Number: [id number] Patient Account Number: [id number] Date of Birth/Sex: Jun 16, 1946 (72 y.o. M) Treating RN: Montey Hora Primary Care Hannelore Bova: [name] Other Clinician: Referring Kahleel Fadeley: [name] Treating Dedric Ethington/Extender: Tito Dine in Treatment: 5 Visit Information History Since Last Visit Added or deleted any medications: No Patient Arrived: Ambulatory Any new allergies or adverse reactions: No Arrival Time: 15:13 Had a fall or experienced change in No Accompanied By: self activities of daily living that may affect Transfer Assistance: None risk of falls: Patient Identification Verified: Yes Signs or symptoms of abuse/neglect since last visito No Secondary Verification Process Completed: Yes Hospitalized since last visit: No Implantable device outside of the clinic excluding No cellular tissue based products placed in the center since last visit: Has Dressing in Place as Prescribed: Yes Pain Present Now: No Electronic Signature(s) Signed: 09/17/2018 5:01:53 PM By: Montey Hora Entered By: Montey Hora on 09/16/2018 15:13:48 [name] ([id number]) -------------------------------------------------------------------------------- Clinic Level of Care Assessment Details Patient Name: [name], [name] Date of Service: 09/16/2018 3:30 PM Medical Record Number: [id number] Patient Account Number: [id number] Date of Birth/Sex: Sep 16, 1946 (72 y.o. M) Treating RN: Cornell Barman Primary Care Ashanna Heinsohn: [name] Other Clinician: Referring Inell Mimbs: [name] Treating Tanara Turvey/Extender: Tito Dine in Treatment: 5 Clinic Level of Care Assessment Items TOOL 4 Quantity Score []  - Use when only an EandM is performed on FOLLOW-UP visit  0 ASSESSMENTS - Nursing Assessment / Reassessment []  - Reassessment of Co-morbidities (includes updates in patient status) 0 X- 1 5 Reassessment of Adherence to Treatment Plan ASSESSMENTS - Wound and Skin Assessment / Reassessment X - Simple Wound Assessment / Reassessment - one wound 1 5 []  - 0 Complex Wound Assessment / Reassessment - multiple wounds []  - 0 Dermatologic / Skin Assessment (not related to wound area) ASSESSMENTS - Focused Assessment []  - Circumferential Edema Measurements - multi extremities 0 []  - 0 Nutritional Assessment / Counseling / Intervention []  - 0 Lower Extremity Assessment (monofilament, tuning fork, pulses) []  - 0 Peripheral Arterial Disease Assessment (using hand held doppler) ASSESSMENTS - Ostomy and/or Continence Assessment and Care []  - Incontinence Assessment and Management 0 []  - 0 Ostomy Care Assessment and Management (repouching, etc.) PROCESS - Coordination of Care X - Simple Patient / Family Education for ongoing care 1 15 []  - 0 Complex (extensive) Patient / Family Education for ongoing care []  - 0 Staff obtains Programmer, systems, Records, Test Results / Process Orders []  - 0 Staff telephones HHA, Nursing Homes / Clarify orders / etc []  - 0 Routine Transfer to another Facility (non-emergent condition) []  - 0 Routine Hospital Admission (non-emergent condition) []  - 0 New Admissions / Biomedical engineer / Ordering NPWT, Apligraf, etc. []  - 0 Emergency Hospital Admission (emergent condition) X- 1 10 Simple Discharge Coordination [name], [name] ([id number]) []  - 0 Complex (extensive) Discharge Coordination PROCESS - Special Needs []  - Pediatric / Minor Patient Management 0 []  - 0 Isolation Patient Management []  - 0 Hearing / Language / Visual special needs []  - 0 Assessment of Community assistance (transportation, D/C planning, etc.) []  - 0 Additional assistance / Altered mentation []  - 0 Support Surface(s) Assessment  (bed, cushion, seat, etc.) INTERVENTIONS - Wound Cleansing / Measurement X - Simple Wound Cleansing - one wound 1 5 []  - 0 Complex Wound Cleansing - multiple wounds X- 1 5 Wound Imaging (photographs -  any number of wounds) []  - 0 Wound Tracing (instead of photographs) X- 1 5 Simple Wound Measurement - one wound []  - 0 Complex Wound Measurement - multiple wounds INTERVENTIONS - Wound Dressings X - Small Wound Dressing one or multiple wounds 1 10 []  - 0 Medium Wound Dressing one or multiple wounds []  - 0 Large Wound Dressing one or multiple wounds []  - 0 Application of Medications - topical []  - 0 Application of Medications - injection INTERVENTIONS - Miscellaneous []  - External ear exam 0 []  - 0 Specimen Collection (cultures, biopsies, blood, body fluids, etc.) []  - 0 Specimen(s) / Culture(s) sent or taken to Lab for analysis []  - 0 Patient Transfer (multiple staff / Civil Service fast streamer / Similar devices) []  - 0 Simple Staple / Suture removal (25 or less) []  - 0 Complex Staple / Suture removal (26 or more) []  - 0 Hypo / Hyperglycemic Management (close monitor of Blood Glucose) []  - 0 Ankle / Brachial Index (ABI) - do not check if billed separately X- 1 5 Vital Signs [name], [name] ([id number]) Has the patient been seen at the hospital within the last three years: Yes Total Score: 65 Level Of Care: New/Established - Level 2 Electronic Signature(s) Signed: 09/17/2018 10:02:59 AM By: Gretta Cool, BSN, RN, CWS, Kim RN, BSN Entered By: Gretta Cool, BSN, RN, CWS, Kim on 09/16/2018 15:34:08 [name] ([id number]) -------------------------------------------------------------------------------- Encounter Discharge Information Details Patient Name: [name], [name] Date of Service: 09/16/2018 3:30 PM Medical Record Number: [id number] Patient Account Number: [id number] Date of Birth/Sex: 28-Oct-1946 (72 y.o. M) Treating RN: Cornell Barman Primary Care Yuritzi Kamp: [name]  Other Clinician: Referring Mavi Un: [name] Treating Marilou Barnfield/Extender: Tito Dine in Treatment: 5 Encounter Discharge Information Items Discharge Condition: Stable Ambulatory Status: Ambulatory Discharge Destination: Home Transportation: Private Auto Accompanied By: self Schedule Follow-up Appointment: Yes Clinical Summary of Care: Electronic Signature(s) Signed: 09/17/2018 10:02:59 AM By: Gretta Cool, BSN, RN, CWS, Kim RN, BSN Entered By: Gretta Cool, BSN, RN, CWS, Kim on 09/16/2018 15:35:10 Budhu, [name] ([id number]) -------------------------------------------------------------------------------- Lower Extremity Assessment Details Patient Name: Morren, [name] Date of Service: 09/16/2018 3:30 PM Medical Record Number: [id number] Patient Account Number: [id number] Date of Birth/Sex: 1946-11-03 (72 y.o. M) Treating RN: Montey Hora Primary Care Dietrich Samuelson: [name] Other Clinician: Referring Lisbeth Puller: [name] Treating Loyed Wilmes/Extender: Tito Dine in Treatment: 5 Vascular Assessment Pulses: Posterior Tibial Extremity colors, hair growth, and conditions: Extremity Color: [Right:Normal] Hair Growth on Extremity: [Right:Yes] Temperature of Extremity: [Right:Warm] Capillary Refill: [Right:< 3 seconds] Toe Nail Assessment Left: Right: Thick: No Discolored: No Deformed: No Improper Length and Hygiene: No Electronic Signature(s) Signed: 09/17/2018 5:01:53 PM By: Montey Hora Entered By: Montey Hora on 09/16/2018 15:19:30 Hofman, [name] ([id number]) -------------------------------------------------------------------------------- Multi Wound Chart Details Patient Name: Ishee, [name] Date of Service: 09/16/2018 3:30 PM Medical Record Number: [id number] Patient Account Number: [id number] Date of Birth/Sex: June 26, 1946 (72 y.o. 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] Comorbid History: [3:Coronary Artery Disease, Hypertension, Type I Diabetes, Osteoarthritis, Received Chemotherapy] [N/A:N/A] Date Acquired: [3:07/20/2018] [N/A:N/A] Weeks of Treatment: [3:5] [N/A:N/A] Wound Status: [3:Open] [N/A:N/A] Pending Amputation on [3:Yes] [N/A:N/A] Presentation:  Measurements L x W x D [3:0.1x0.1x0.1] [N/A:N/A] (cm) Area (cm) : [3:0.008] [N/A:N/A] Volume (cm) : [3:0.001] [N/A:N/A] % Reduction in Area: [3:94.90%] [N/A:N/A] % Reduction in Volume: [3:96.80%] [N/A:N/A] Classification: [3:Grade 3] [N/A:N/A] Exudate Amount: [3:Medium] [N/A:N/A] Exudate Type: [3:Serous] [N/A:N/A] Exudate Color: [3:amber] [N/A:N/A] Wound Margin: [3:Flat and Intact] [N/A:N/A] Granulation Amount: [3:None Present (0%)] [N/A:N/A] Necrotic Amount: [3:Large (67-100%)] [N/A:N/A] Necrotic Tissue: [3:Eschar] [N/A:N/A] Exposed Structures: [3:Fat Layer (Subcutaneous Tissue) Exposed: Yes Fascia: No Tendon: No Muscle: No Joint: No Bone: No] [N/A:N/A] Epithelialization: [3:Large (67-100%)] [N/A:N/A] Periwound Skin Texture: [N/A:N/A] Callus: Yes Excoriation: No Induration: No Crepitus: No Rash: No Scarring: No Periwound Skin Moisture: Maceration: No N/A N/A Dry/Scaly: No Periwound Skin Color: Atrophie Blanche: No N/A N/A Cyanosis: No Ecchymosis: No Erythema: No Hemosiderin Staining: No Mottled: No Pallor: No Rubor: No Temperature: No Abnormality N/A N/A Tenderness on Palpation: No N/A N/A Wound  Preparation: Ulcer Cleansing: N/A N/A Rinsed/Irrigated with Saline Topical Anesthetic Applied: Other: lidocaine 4% Treatment Notes Wound #3 (Left Toe Great) Notes coverlet Electronic Signature(s) Signed: 09/16/2018 4:33:49 PM By: Linton Ham MD Entered By: Linton Ham on 09/16/2018 15:44:03 Mast, [name] ([id number]) -------------------------------------------------------------------------------- [place] Details Patient Name: Liebman, [name] Date of Service: 09/16/2018 3:30 PM Medical Record Number: [id number] Patient Account Number: [id number] Date of Birth/Sex: May 17, 1946 (72 y.o. M) Treating RN: Cornell Barman Primary Care Bobby Ragan: [name] Other Clinician: Referring Christeen Lai: [name] Treating Alexei Doswell/Extender: Tito Dine in Treatment: 5 Active Inactive Electronic Signature(s) Signed: 09/17/2018 10:02:59 AM By: Gretta Cool, BSN, RN, CWS, Kim RN, BSN Entered By: Gretta Cool, BSN, RN, CWS, Kim on 09/16/2018 15:31:16 Verrilli, [name] ([id number]) -------------------------------------------------------------------------------- Pain Assessment Details Patient Name: Westerfield, [name] Date of Service: 09/16/2018 3:30 PM Medical Record Number: [id number] Patient Account Number: [id number] Date of Birth/Sex: 03/13/1946 (72 y.o. M) Treating RN: Montey Hora Primary Care Junette Bernat: [name] Other Clinician: Referring Merlinda Wrubel: [name] Treating Patience Nuzzo/Extender: Tito Dine in Treatment: 5 Active Problems Location of Pain Severity and Description of Pain Patient Has Paino No Site Locations Pain Management and Medication Current Pain Management: Electronic Signature(s) Signed: 09/17/2018 5:01:53 PM By: Montey Hora Entered By: Montey Hora on 09/16/2018 15:13:53 Meiners, [name]  ([id number]) -------------------------------------------------------------------------------- Patient/Caregiver Education Details Patient Name: Pylant, [name] Date of Service: 09/16/2018 3:30 PM Medical Record Number: [id number] Patient Account Number: [id number] Date of Birth/Gender: [birth year] (72 y.o. M) Treating RN: Cornell Barman Primary Care Physician: [name] Other Clinician: Referring Physician: [name] Treating Physician/Extender: Tito Dine in Treatment: 5 Education Assessment Education Provided To: Patient Education Topics Provided Wound/Skin Impairment: Handouts: Other: protect new skin Methods: Demonstration, Explain/Verbal Responses: State content correctly Electronic Signature(s) Signed: 09/17/2018 10:02:59 AM By: Gretta Cool, BSN, RN, CWS, Kim RN, BSN Entered By: Gretta Cool, BSN, RN, CWS, Kim on 09/16/2018 15:35:14 [name], [name] ([id number]) -------------------------------------------------------------------------------- Wound Assessment Details Patient Name: Delagarza, [name] Date of Service: 09/16/2018 3:30 PM Medical Record Number: [id number] Patient Account Number: [id number] Date of Birth/Sex: [birth year] (72 y.o. M) Treating RN: Cornell Barman Primary Care Orpha Dain: [name] Other Clinician: Referring Novalynn Branaman: [name] Treating Harmonii Karle/Extender: Tito Dine in Treatment: 5 Wound Status Wound Number: 3 Primary Diabetic Wound/Ulcer of the Lower Extremity Etiology: Wound Location: Left Toe Great Wound Treated Outside [place] Event: Gradually Appeared Status: Date Acquired: 07/20/2018 Comorbid Coronary Artery Disease, Hypertension, Type I Weeks Of Treatment: 5 History: Diabetes, Osteoarthritis, Received Clustered Wound: No Chemotherapy Pending Amputation On Presentation Wound under treatment by Crewe Heathman outside of Wound  [place] Uploaded By: Sharon Mt on 09/16/2018  16:25:33 Wound Measurements Length: (cm) 0 % Re Width: (cm) 0 % Re Depth: (cm) 0 Epit Area: (cm) 0 Tun Volume: (cm) 0 Und duction in Area: 100% duction in Volume: 100% helialization: Large (67-100%) neling: No ermining: No Wound Description Classification: Grade 3 Wound Margin: Flat and Intact Exudate Amount: Medium Exudate Type: Serous Exudate Color: amber Foul Odor After Cleansing: No Slough/Fibrino No Wound Bed Granulation Amount: None Present (0%) Exposed Structure Necrotic Amount: Large (67-100%) Fascia Exposed: No Necrotic Quality: Eschar Fat Layer (Subcutaneous Tissue) Exposed: Yes Tendon Exposed: No Muscle Exposed: No Joint Exposed: No Bone Exposed: No Rogoff, [name] ([id number]) Periwound Skin Texture Texture Color No Abnormalities Noted: No No Abnormalities Noted: No Callus: Yes Atrophie Blanche: No Crepitus: No Cyanosis: No Excoriation: No Ecchymosis: No Induration: No Erythema: No Rash: No Hemosiderin Staining: No Scarring: No Mottled: No Pallor: No Moisture Rubor: No No Abnormalities Noted: No Dry / Scaly: No Temperature / Pain Maceration: No Temperature: No Abnormality Wound Preparation Ulcer Cleansing: Rinsed/Irrigated with Saline Topical Anesthetic Applied: Other: lidocaine 4%, Treatment Notes Wound #3 (Left Toe Great) Notes coverlet Electronic Signature(s) Signed: 09/17/2018 5:47:28 PM By: Gretta Cool, BSN, RN, CWS, Kim RN, BSN Previous Signature: 09/17/2018 5:01:53 PM Version By: Montey Hora Entered By: Gretta Cool, BSN, RN, CWS, Kim on 09/17/2018 17:42:32 Brigandi, [name] ([id number]) -------------------------------------------------------------------------------- Vitals Details Patient Name: Poucher, [name] Date of Service: 09/16/2018 3:30 PM Medical Record Number: [id number] Patient Account Number: [id number] Date of Birth/Sex: [birth year] (72 y.o. M) Treating RN: Montey Hora Primary Care Mackey Varricchio: [name] Other Clinician: Referring Jamorris Ndiaye: [name] Treating Jasun Gasparini/Extender: Tito Dine in Treatment: 5 Vital Signs Time Taken: 15:14 Temperature (F): 97.9 Height (in): 69 Pulse (bpm): 72 Weight (lbs): 183.2 Respiratory Rate (breaths/min): 16 Body Mass Index (BMI): 27.1 Blood Pressure (mmHg): 113/66 Reference Range: 80 - 120 mg / dl Electronic Signature(s) Signed: 09/17/2018 5:01:53 PM By: Montey Hora Entered By: Montey Hora on 09/16/2018 15:15:51

## 2018-09-19 NOTE — Progress Notes (Signed)
[name], [name] ([id number]) Visit Report for 09/16/2018 HPI Details Patient Name: [name], [name] Date of Service: 09/16/2018 3:30 PM Medical Record Number: [id number] Patient Account Number: [id number] Date of Birth/Sex: 1946/09/16 (72 y.o. M) Treating RN: [name] Primary Care Provider: Maryland [name] Other Clinician: Referring Provider: Maryland [name] Treating Provider/Extender: [name] in Treatment: 5 History of Present Illness Location: right anterior shin Quality: Patient reports No Pain. Severity: Patient states wound are getting worse. Duration: Patient has had the wound for > 6 months prior to seeking treatment at the wound center Context: The wound appeared gradually over time Modifying Factors: Other treatment(s) tried include: taken doxycycline and use bacitracin Associated Signs and Symptoms: Patient reports presence of swelling HPI Description: 72 year old male recently seen by his PCP Dr. Tawnya [name] who saw him for a wound on the right leg which is [name] tender and he is continuing to use bacitracin and has completed her course of doxycycline. Past medical history of coronary artery disease, diabetes mellitus type 1, hyperlipidemia, hypertension, MI, plantar fascial fibromatosis, pneumonia, status post foot surgery due to trauma on the left side, coronary angioplasty. he is a former smoker and quit in July 1996. last hemoglobin A1c was 7.8 % in April of this year 5/22 2017 -- biopsy of the right lower extremity was sent for pathology and the report is that of a basal cell carcinoma with edges of the biopsy are involved. I have called the patient over the phone( 03/28/16) and discussed the pathology report with him and he is agreeable to see a surgeon for excision and need full. I have also spoken personally to Dr. Dahlia [name], of [facility] and referred the patient for further wide excision of this lesion and have communicated this to the  patient. READMISSION 08/12/18 This is a now 72 year old man with type 1 diabetes. Hemoglobin A1c on 4/29 was 7.9. He is listed as having a history of PAD in the [place] records although the patient doesn't recall this, doesn't complain of any symptoms of claudication and doesn't believe he has had any prior noninvasive arterial test. He is a nonsmoker. He was here in 2017 with a wound on his Right lower leg. This was biopsied in the clinic and pathology showed a basal cell carcinoma. He went on to have surgery on this area this is currently where I believe his current wounds are located. He generally bumped the lateral part of his right calf 2 weeks ago and has a superficial abrasion. He also has 2 small open areas on the medial part of the tibia in the same area. Our intake nurse noted a stitch coming out of this which was removed. These of the wound sees actually come to the clinic for. However the more concerning area is an area on the tip of the left great toe. He says that this was initially traumatic and he's been to see [name] podiatrist. [name] been using what I think is Santyl to the wound tip. This has some depth. ABIs in this clinic were non-obtainable on the right and 1.84 on the left. 08/19/18; the patient had arterial studies that did not show evidence of significant hemodynamically compromising occlusions in either leg. There was mild medial atherosclerosis bilaterally. His resting ABIs were within the normal limits at 1.3 on the right and 1.4 on the left. There was normal posterior and anterior tibial artery waveforms and velocities listed on both sides. The patient arrives with what appears to be a  healthy surface over the tip of his left great toe. This is indeed surprising. Still looks somewhat friable however. More concerning is the x-ray that we did that showed erosions of the tuft of the distal [name], [name] ([id number]) phalanx the left great toe consistent  with osteomyelitis. I attempted to pull this x-ray up on colon health Link however I can't open the film to look at this myself. 08/26/18; I reviewed the patient's x-ray and colon helpline. Indeed there is fairly obvious erosion of the tip of his left great toe. The wound was initially a traumatic area hitting it sometime in the middle of night in his kitchen. He is a type I diabetic. I have him on Flagyl and Levaquin that I started last week i.e. 7 days ago. He has an appointment with infectious disease on 09/01/18 09/02/18; the patient was seen by Dr. Delaine [name] of infectious disease. She did not feel the patient needed IV antibiotics. I do feel he needs further oral antibiotics however. He does not need anymore Flagyl but he does need another 2 weeks of Levaquin. The areas just about closed. 09/16/18; the patient is completing his Levaquin today. There is no open wound on the tip of the toe Electronic Signature(s) Signed: 09/16/2018 4:33:49 PM By: [name] Ham MD Entered By: [name] on 09/16/2018 15:45:26 [name], [name] ([id number]) -------------------------------------------------------------------------------- Physical Exam Details Patient Name: [name], [name] Date of Service: 09/16/2018 3:30 PM Medical Record Number: [id number] Patient Account Number: [id number] Date of Birth/Sex: Apr 07, 1946 (72 y.o. M) Treating RN: [name] Primary Care Provider: Maryland [name] Other Clinician: Referring Provider: Maryland [name] Treating Provider/Extender: [name] in Treatment: 5 Constitutional Sitting or standing Blood Pressure is within target range for patient.. Pulse regular and within target range for patient.Marland Kitchen Respirations regular, non-labored and within target range.. Temperature is normal and within the target range for the patient.Marland Kitchen appears in no distress. Notes Wound exam; the area on the tip of the toe is fully epithelialized. There was still  some eschar on the surface I removed this with a #3 curet there is no open wound here everything appears viable no erythema Electronic Signature(s) Signed: 09/16/2018 4:33:49 PM By: [name] Ham MD Entered By: [name] on 09/16/2018 15:46:12 [place], [name] ([id number]) -------------------------------------------------------------------------------- Physician Orders Details Patient Name: [name], [name] Date of Service: 09/16/2018 3:30 PM Medical Record Number: [id number] Patient Account Number: [id number] Date of Birth/Sex: 1946-04-28 (72 y.o. M) Treating RN: [name] Primary Care Provider: Maryland [name] Other Clinician: Referring Provider: Maryland [name] Treating Provider/Extender: [name] in Treatment: 5 Verbal / Phone Orders: No Diagnosis Coding Discharge From [facility] Services Wound #3 Left Toe Great o Discharge from [facility] Complete Electronic Signature(s) Signed: 09/16/2018 4:33:49 PM By: [name] Ham MD Signed: 09/17/2018 10:02:59 AM By: Gretta Cool, BSN, RN, CWS, Kim RN, BSN Entered By: Gretta Cool, BSN, RN, CWS, Kim on 09/16/2018 15:31:59 Kriesel, [name] ([id number]) -------------------------------------------------------------------------------- Problem List Details Patient Name: [name], [name] Date of Service: 09/16/2018 3:30 PM Medical Record Number: [id number] Patient Account Number: [id number] Date of Birth/Sex: 08-23-1946 (72 y.o. M) Treating RN: [name] Primary Care Provider: Maryland [name] Other Clinician: Referring Provider: Maryland [name] Treating Provider/Extender: [name] in Treatment: 5 Active Problems ICD-10 Evaluated Encounter Code Description Active Date Today Diagnosis E10.621 Type 1 diabetes mellitus with foot ulcer 08/12/2018 No Yes L97.523 Non-pressure chronic ulcer of other part of left foot with 08/12/2018 No Yes necrosis of muscle L97.211 Non-pressure  chronic ulcer  of right calf limited to breakdown 08/12/2018 No Yes of skin E10.51 Type 1 diabetes mellitus with diabetic peripheral angiopathy 08/12/2018 No Yes without gangrene L79.892 Other chronic osteomyelitis, left ankle and foot 08/19/2018 No Yes Inactive Problems Resolved Problems Electronic Signature(s) Signed: 09/16/2018 4:33:49 PM By: [name] Ham MD Entered By: [name] on 09/16/2018 15:43:44 Amparo, [name] ([id number]) -------------------------------------------------------------------------------- Progress Note Details Patient Name: [name], [name] Date of Service: 09/16/2018 3:30 PM Medical Record Number: [id number] Patient Account Number: [id number] Date of Birth/Sex: 06/18/1946 (72 y.o. M) Treating RN: [name] Primary Care Provider: Maryland [name] Other Clinician: Referring Provider: Maryland [name] Treating Provider/Extender: [name] in Treatment: 5 Subjective History of Present Illness (HPI) The following HPI elements were documented for the patient's wound: Location: right anterior shin Quality: Patient reports No Pain. Severity: Patient states wound are getting worse. Duration: Patient has had the wound for > 6 months prior to seeking treatment at the wound center Context: The wound appeared gradually over time Modifying Factors: Other treatment(s) tried include: taken doxycycline and use bacitracin Associated Signs and Symptoms: Patient reports presence of swelling 72 year old male recently seen by his PCP Dr. Tawnya [name] who saw him for a wound on the right leg which is [name] tender and he is continuing to use bacitracin and has completed her course of doxycycline. Past medical history of coronary artery disease, diabetes mellitus type 1, hyperlipidemia, hypertension, MI, plantar fascial fibromatosis, pneumonia, status post foot surgery due to trauma on the left side, coronary angioplasty. he is a former smoker and quit in July  1996. last hemoglobin A1c was 7.8 % in April of this year 5/22 2017 -- biopsy of the right lower extremity was sent for pathology and the report is that of a basal cell carcinoma with edges of the biopsy are involved. I have called the patient over the phone( 03/28/16) and discussed the pathology report with him and he is agreeable to see a surgeon for excision and need full. I have also spoken personally to Dr. Dahlia [name], of [facility] surgical and referred the patient for further wide excision of this lesion and have communicated this to the patient. READMISSION 08/12/18 This is a now 72 year old man with type 1 diabetes. Hemoglobin A1c on 4/29 was 7.9. He is listed as having a history of PAD in the [place] records although the patient doesn't recall this, doesn't complain of any symptoms of claudication and doesn't believe he has had any prior noninvasive arterial test. He is a nonsmoker. He was here in 2017 with a wound on his Right lower leg. This was biopsied in the clinic and pathology showed a basal cell carcinoma. He went on to have surgery on this area this is currently where I believe his current wounds are located. He generally bumped the lateral part of his right calf 2 weeks ago and has a superficial abrasion. He also has 2 small open areas on the medial part of the tibia in the same area. Our intake nurse noted a stitch coming out of this which was removed. These of the wound sees actually come to the clinic for. However the more concerning area is an area on the tip of the left great toe. He says that this was initially traumatic and he's been to see [name] podiatrist. [name] been using what I think is Santyl to the wound tip. This has some depth. ABIs in this clinic were non-obtainable on the right and 1.84 on the  left. 08/19/18; the patient had arterial studies that did not show evidence of significant hemodynamically compromising occlusions in either leg. There was mild medial  atherosclerosis bilaterally. His resting ABIs were within the normal limits at 1.3 on the right and 1.4 on the left. There was normal posterior and anterior tibial artery waveforms and velocities listed on both sides. The patient arrives with what appears to be a healthy surface over the tip of his left great toe. This is indeed surprising. Still looks somewhat friable however. More concerning is the x-ray that we did that showed erosions of the tuft of the distal [name], [name] ([id number]) phalanx the left great toe consistent with osteomyelitis. I attempted to pull this x-ray up on colon health Link however I can't open the film to look at this myself. 08/26/18; I reviewed the patient's x-ray and colon helpline. Indeed there is fairly obvious erosion of the tip of his left great toe. The wound was initially a traumatic area hitting it sometime in the middle of night in his kitchen. He is a type I diabetic. I have him on Flagyl and Levaquin that I started last week i.e. 7 days ago. He has an appointment with infectious disease on 09/01/18 09/02/18; the patient was seen by Dr. Delaine [name] of infectious disease. She did not feel the patient needed IV antibiotics. I do feel he needs further oral antibiotics however. He does not need anymore Flagyl but he does need another 2 weeks of Levaquin. The areas just about closed. 09/16/18; the patient is completing his Levaquin today. There is no open wound on the tip of the toe Objective Constitutional Sitting or standing Blood Pressure is within target range for patient.. Pulse regular and within target range for patient.Marland Kitchen Respirations regular, non-labored and within target range.. Temperature is normal and within the target range for the patient.Marland Kitchen appears in no distress. Vitals Time Taken: 3:14 PM, Height: 69 in, Weight: 183.2 lbs, BMI: 27.1, Temperature: 97.9 F, Pulse: 72 bpm, Respiratory Rate: 16 breaths/min, Blood Pressure: 113/66  mmHg. General Notes: Wound exam; the area on the tip of the toe is fully epithelialized. There was still some eschar on the surface I removed this with a #3 curet there is no open wound here everything appears viable no erythema Integumentary (Hair, Skin) Wound #3 status is Open. Original cause of wound was Gradually Appeared. The wound is located on the Left Toe Great. The wound measures 0.1cm length x 0.1cm width x 0.1cm depth; 0.008cm^2 area and 0.001cm^3 volume. There is Fat Layer (Subcutaneous Tissue) Exposed exposed. There is no tunneling or undermining noted. There is a medium amount of serous drainage noted. The wound margin is flat and intact. There is no granulation within the wound bed. There is a large (67-100%) amount of necrotic tissue within the wound bed including Eschar. The periwound skin appearance exhibited: Callus. The periwound skin appearance did not exhibit: Crepitus, Excoriation, Induration, Rash, Scarring, Dry/Scaly, Maceration, Atrophie Blanche, Cyanosis, Ecchymosis, Hemosiderin Staining, Mottled, Pallor, Rubor, Erythema. Periwound temperature was noted as No Abnormality. Assessment Active Problems ICD-10 Type 1 diabetes mellitus with foot ulcer Non-pressure chronic ulcer of other part of left foot with necrosis of muscle Non-pressure chronic ulcer of right calf limited to breakdown of skin Type 1 diabetes mellitus with diabetic peripheral angiopathy without gangrene Other chronic osteomyelitis, left ankle and foot [name], [name] Kitchen ([id number]) Plan Discharge From [facility] Services: Wound #3 Left Toe Great: Discharge from [facility] - Treatment Complete #1 the patient to  be discharged from the wound care center #2 I have explained the concept of "closed versus healed" we'll need to monitor this toe carefully. #3 of advised him to keep this padded with a Band-Aid while he is in his shoes. Electronic Signature(s) Signed: 09/16/2018 4:33:49 PM By: [name] Ham MD Entered By: [name] on 09/16/2018 15:47:00 [name], [name] ([id number]) -------------------------------------------------------------------------------- SuperBill Details Patient Name: [name], [name] Date of Service: 09/16/2018 Medical Record Number: [id number] Patient Account Number: [id number] Date of Birth/Sex: 08/16/1946 (72 y.o. M) Treating RN: [name] Primary Care Provider: Maryland [name] Other Clinician: Referring Provider: Maryland [name] Treating Provider/Extender: [name] in Treatment: 5 Diagnosis Coding ICD-10 Codes Code Description [phone] Type 1 diabetes mellitus with foot ulcer L97.523 Non-pressure chronic ulcer of other part of left foot with necrosis of muscle L97.211 Non-pressure chronic ulcer of right calf limited to breakdown of skin E10.51 Type 1 diabetes mellitus with diabetic peripheral angiopathy without gangrene M86.672 Other chronic osteomyelitis, left ankle and foot Facility Procedures CPT4 Code: [id number] Description: [phone] - WOUND CARE VISIT-LEV 2 EST PT Modifier: Quantity: 1 Physician Procedures CPT4 Code: 0109323 Description: 55732 - WC PHYS LEVEL 2 - EST PT ICD-10 Diagnosis Description E10.621 Type 1 diabetes mellitus with foot ulcer M86.672 Other chronic osteomyelitis, left ankle and foot L97.523 Non-pressure chronic ulcer of other part of left foot with necr Modifier: osis of muscle Quantity: 1 Electronic Signature(s) Signed: 09/16/2018 4:33:49 PM By: [name] Ham MD Entered By: [name] on 09/16/2018 15:47:47

## 2018-09-24 DIAGNOSIS — D649 Anemia, unspecified: Secondary | ICD-10-CM | POA: Diagnosis not present

## 2018-09-24 DIAGNOSIS — E1029 Type 1 diabetes mellitus with other diabetic kidney complication: Secondary | ICD-10-CM | POA: Diagnosis not present

## 2018-09-24 DIAGNOSIS — R809 Proteinuria, unspecified: Secondary | ICD-10-CM | POA: Diagnosis not present

## 2018-09-24 DIAGNOSIS — R197 Diarrhea, unspecified: Secondary | ICD-10-CM | POA: Diagnosis not present

## 2018-09-28 DIAGNOSIS — N390 Urinary tract infection, site not specified: Secondary | ICD-10-CM | POA: Diagnosis not present

## 2018-09-30 ENCOUNTER — Ambulatory Visit: Payer: Self-pay | Admitting: Internal Medicine

## 2018-10-01 DIAGNOSIS — R809 Proteinuria, unspecified: Secondary | ICD-10-CM | POA: Diagnosis not present

## 2018-10-01 DIAGNOSIS — E1042 Type 1 diabetes mellitus with diabetic polyneuropathy: Secondary | ICD-10-CM | POA: Diagnosis not present

## 2018-10-01 DIAGNOSIS — E1029 Type 1 diabetes mellitus with other diabetic kidney complication: Secondary | ICD-10-CM | POA: Diagnosis not present

## 2018-10-01 DIAGNOSIS — E1059 Type 1 diabetes mellitus with other circulatory complications: Secondary | ICD-10-CM | POA: Diagnosis not present

## 2018-10-01 DIAGNOSIS — Z9641 Presence of insulin pump (external) (internal): Secondary | ICD-10-CM | POA: Diagnosis not present

## 2018-10-01 DIAGNOSIS — E103212 Type 1 diabetes mellitus with mild nonproliferative diabetic retinopathy with macular edema, left eye: Secondary | ICD-10-CM | POA: Diagnosis not present

## 2018-10-10 DIAGNOSIS — E109 Type 1 diabetes mellitus without complications: Secondary | ICD-10-CM | POA: Diagnosis not present

## 2018-10-12 DIAGNOSIS — E103212 Type 1 diabetes mellitus with mild nonproliferative diabetic retinopathy with macular edema, left eye: Secondary | ICD-10-CM | POA: Diagnosis not present

## 2018-10-12 DIAGNOSIS — R809 Proteinuria, unspecified: Secondary | ICD-10-CM | POA: Diagnosis not present

## 2018-10-12 DIAGNOSIS — Z9641 Presence of insulin pump (external) (internal): Secondary | ICD-10-CM | POA: Diagnosis not present

## 2018-10-12 DIAGNOSIS — E103312 Type 1 diabetes mellitus with moderate nonproliferative diabetic retinopathy with macular edema, left eye: Secondary | ICD-10-CM | POA: Diagnosis not present

## 2018-10-12 DIAGNOSIS — E1042 Type 1 diabetes mellitus with diabetic polyneuropathy: Secondary | ICD-10-CM | POA: Diagnosis not present

## 2018-10-12 DIAGNOSIS — E10649 Type 1 diabetes mellitus with hypoglycemia without coma: Secondary | ICD-10-CM | POA: Diagnosis not present

## 2018-10-12 DIAGNOSIS — E1059 Type 1 diabetes mellitus with other circulatory complications: Secondary | ICD-10-CM | POA: Diagnosis not present

## 2018-10-12 DIAGNOSIS — E1029 Type 1 diabetes mellitus with other diabetic kidney complication: Secondary | ICD-10-CM | POA: Diagnosis not present

## 2018-10-19 DIAGNOSIS — R300 Dysuria: Secondary | ICD-10-CM | POA: Diagnosis not present

## 2018-10-19 DIAGNOSIS — N323 Diverticulum of bladder: Secondary | ICD-10-CM | POA: Diagnosis not present

## 2018-10-19 DIAGNOSIS — Z8551 Personal history of malignant neoplasm of bladder: Secondary | ICD-10-CM | POA: Diagnosis not present

## 2018-10-19 DIAGNOSIS — N401 Enlarged prostate with lower urinary tract symptoms: Secondary | ICD-10-CM | POA: Diagnosis not present

## 2018-10-22 DIAGNOSIS — Z23 Encounter for immunization: Secondary | ICD-10-CM | POA: Diagnosis not present

## 2018-10-22 DIAGNOSIS — M545 Low back pain: Secondary | ICD-10-CM | POA: Diagnosis not present

## 2018-10-22 DIAGNOSIS — E782 Mixed hyperlipidemia: Secondary | ICD-10-CM | POA: Diagnosis not present

## 2018-10-22 DIAGNOSIS — Z0000 Encounter for general adult medical examination without abnormal findings: Secondary | ICD-10-CM | POA: Diagnosis not present

## 2018-11-02 DIAGNOSIS — R3129 Other microscopic hematuria: Secondary | ICD-10-CM | POA: Diagnosis not present

## 2018-11-02 DIAGNOSIS — R310 Gross hematuria: Secondary | ICD-10-CM | POA: Diagnosis not present

## 2018-11-02 DIAGNOSIS — C671 Malignant neoplasm of dome of bladder: Secondary | ICD-10-CM | POA: Diagnosis not present

## 2018-11-02 DIAGNOSIS — Z8551 Personal history of malignant neoplasm of bladder: Secondary | ICD-10-CM | POA: Diagnosis not present

## 2018-11-02 DIAGNOSIS — N401 Enlarged prostate with lower urinary tract symptoms: Secondary | ICD-10-CM | POA: Diagnosis not present

## 2018-11-10 DIAGNOSIS — E109 Type 1 diabetes mellitus without complications: Secondary | ICD-10-CM | POA: Diagnosis not present

## 2018-11-13 DIAGNOSIS — E103212 Type 1 diabetes mellitus with mild nonproliferative diabetic retinopathy with macular edema, left eye: Secondary | ICD-10-CM | POA: Diagnosis not present

## 2018-11-13 DIAGNOSIS — E1029 Type 1 diabetes mellitus with other diabetic kidney complication: Secondary | ICD-10-CM | POA: Diagnosis not present

## 2018-11-13 DIAGNOSIS — E1059 Type 1 diabetes mellitus with other circulatory complications: Secondary | ICD-10-CM | POA: Diagnosis not present

## 2018-11-13 DIAGNOSIS — Z9641 Presence of insulin pump (external) (internal): Secondary | ICD-10-CM | POA: Diagnosis not present

## 2018-11-13 DIAGNOSIS — E1042 Type 1 diabetes mellitus with diabetic polyneuropathy: Secondary | ICD-10-CM | POA: Diagnosis not present

## 2018-11-13 DIAGNOSIS — R809 Proteinuria, unspecified: Secondary | ICD-10-CM | POA: Diagnosis not present

## 2018-11-17 DIAGNOSIS — E103312 Type 1 diabetes mellitus with moderate nonproliferative diabetic retinopathy with macular edema, left eye: Secondary | ICD-10-CM | POA: Diagnosis not present

## 2018-11-24 ENCOUNTER — Inpatient Hospital Stay: Admission: RE | Admit: 2018-11-24 | Payer: Self-pay | Source: Ambulatory Visit

## 2018-11-26 NOTE — H&P (Addendum)
NAME: [name], [name] MEDICAL RECORD ZO:[id number] ACCOUNT [id number] DATE OF BIRTH:July 12, 1946 FACILITY: [facility] LOCATION: [facility] PHYSICIAN:MICHAEL Farrel Conners, MD  HISTORY AND PHYSICAL  DATE OF ADMISSION:  12/08/2018  CHIEF COMPLAINT:  Bladder cancer.  HISTORY OF PRESENT ILLNESS:  The patient is a 73 year old white male with history of high grade superficial micropapillary urothelial carcinoma who underwent tumor resection 07/2017.  He underwent a second look biopsy in December 2018 which was benign.   However, there was some focal atypia at the edge of that biopsy site.  He received a 1 year course of intravesical mitomycin treatments from 08/12/2017 until 06/2018.  He underwent surveillance cystoscopy in the office 12/23 which revealed necrotic  bladder mucosa in 2 areas of the posterior bladder wall.  A definite tumor was not identified.  He comes in now for cystoscopy with bladder biopsy.  Most recent upper tract evaluation with CT occurred in 12/2017 which was normal.  Most recent urine  cytology 12/23 was negative.  PAST MEDICAL HISTORY:   ALLERGIES:  No drug allergies.  CURRENT MEDICATIONS:  Plavix, losartan, Lasix, carvedilol, multivitamins and aspirin.  PAST SURGICAL HISTORY:  Coronary artery stent placement in 2006, transurethral resection of bladder tumor 2018.  SOCIAL HISTORY:  The patient quit smoking in 1998 with a 15-pack-year history.  He denied alcohol use.  FAMILY HISTORY:  The patient's father died of myocardial infarction at age 44.  Mother died of heart disease at age 90.  PAST AND CURRENT MEDICAL CONDITIONS: 1.  Coronary artery disease status post myocardial infarction in 2006. 2.  Diabetes since 1967. 3.  Hypertension.  REVIEW OF SYSTEMS:  The patient reports that he bruises easily.  He denies chest pain, shortness of breath or stroke.  PHYSICAL EXAMINATION: GENERAL:  Well-nourished white male in no acute distress. HEENT:  Sclerae were  clear. NECK:  No palpable cervical adenopathy.  No audible carotid bruits. PULMONARY:  Lungs clear to auscultation. CARDIOVASCULAR:  Regular rhythm and rate without audible murmurs. ABDOMEN:  Soft, nontender abdomen. GENITOURINARY:  Circumcised.  Testes were smooth, nontender. RECTAL:  A 40 g smooth, nontender prostate. NEUROMUSCULAR:  Alert and oriented x3.  IMPRESSION:  Bladder cancer.  PLAN:  Cystoscopy with bladder biopsy.  TN/NUANCE  D:11/26/2018 T:11/26/2018 JOB:[id number]

## 2018-12-01 ENCOUNTER — Encounter
Admission: RE | Admit: 2018-12-01 | Discharge: 2018-12-01 | Disposition: A | Discharge status: Home / Self Care | Payer: Medicare HMO | Source: Ambulatory Visit | Attending: Urology | Admitting: Urology

## 2018-12-01 ENCOUNTER — Other Ambulatory Visit: Payer: Self-pay

## 2018-12-01 DIAGNOSIS — Z01818 Encounter for other preprocedural examination: Secondary | ICD-10-CM | POA: Insufficient documentation

## 2018-12-01 DIAGNOSIS — I10 Essential (primary) hypertension: Secondary | ICD-10-CM | POA: Diagnosis not present

## 2018-12-01 DIAGNOSIS — R9431 Abnormal electrocardiogram [ECG] [EKG]: Secondary | ICD-10-CM | POA: Insufficient documentation

## 2018-12-01 HISTORY — DX: Peripheral vascular disease, unspecified: I73.9

## 2018-12-01 NOTE — Patient Instructions (Addendum)
Your procedure is scheduled on: 12/08/18 Tues Report to Same Day Surgery 2nd floor medical mall [facility] Entrance-take elevator on left to 2nd floor.  Check in with surgery information desk.) To find out your arrival time please call [phone] between 1PM - 3PM on 12/07/18 Mon  Remember: Instructions that are not followed completely may result in serious medical risk, up to and including death, or upon the discretion of your surgeon and anesthesiologist your surgery may need to be rescheduled.    _x___ 1. Do not eat food after midnight the night before your procedure. You may drink clear liquids up to 2 hours before you are scheduled to arrive at the hospital for your procedure.  Do not drink clear liquids within 2 hours of your scheduled arrival to the hospital.  Clear liquids include  --Water or Apple juice without pulp  --Clear carbohydrate beverage such as ClearFast or Gatorade  --Black Coffee or Clear Tea (No milk, no creamers, do not add anything to                  the coffee or Tea Type 1 and type 2 diabetics should only drink water.   ____Ensure clear carbohydrate drink on the way to the hospital for bariatric patients  ____Ensure clear carbohydrate drink 3 hours before surgery for Dr Dwyane Luo patients if physician instructed.   No gum chewing or hard candies.     __x__ 2. No Alcohol for 24 hours before or after surgery.   __x__3. No Smoking or e-cigarettes for 24 prior to surgery.  Do not use any chewable tobacco products for at least 6 hour prior to surgery   ____  4. Bring all medications with you on the day of surgery if instructed.    __x__ 5. Notify your doctor if there is any change in your medical condition     (cold, fever, infections).    x___6. On the morning of surgery brush your teeth with toothpaste and water.  You may rinse your mouth with mouth wash if you wish.  Do not swallow any toothpaste or mouthwash.   Do not wear jewelry, make-up, hairpins,  clips or nail polish.  Do not wear lotions, powders, or perfumes. You may wear deodorant.  Do not shave 48 hours prior to surgery. Men may shave face and neck.  Do not bring valuables to the hospital.    [facility] is not responsible for any belongings or valuables.               Contacts, dentures or bridgework may not be worn into surgery.  Leave your suitcase in the car. After surgery it may be brought to your room.  For patients admitted to the hospital, discharge time is determined by your                       treatment team.  _  Patients discharged the day of surgery will not be allowed to drive home.  You will need someone to drive you home and stay with you the night of your procedure.    Please read over the following fact sheets that you were given:   [facility] Preparing for Surgery and or MRSA Information   _x___ Take anti-hypertensive listed below, cardiac, seizure, asthma,     anti-reflux and psychiatric medicines. These include:  1. carvedilol (COREG) 12.5 MG tablet  2.  3.  4.  5.  6.  ____Fleets enema or  Magnesium Citrate as directed.   ____ Use CHG Soap or sage wipes as directed on instruction sheet   ____ Use inhalers on the day of surgery and bring to hospital day of surgery  ____ Stop Metformin and Janumet 2 days prior to surgery.    ____ Take 1/2 of usual insulin dose the night before surgery and none on the morning     surgery.   _x___ Follow recommendations from Cardiologist, Pulmonologist or PCP regarding          stopping Aspirin, Coumadin, Plavix ,Eliquis, Effient, or Pradaxa, and Pletal. Stopped Aspirin today  X____Stop Anti-inflammatories such as Advil, Aleve, Ibuprofen, Motrin, Naproxen, Naprosyn, Goodies powders or aspirin products. OK to take Tylenol and                          Celebrex.   _x___ Stop supplements until after surgery.  But may continue Vitamin D, Vitamin B,       and multivitamin.   ____ Bring C-Pap to the hospital.

## 2018-12-08 ENCOUNTER — Ambulatory Visit: Payer: Medicare HMO | Admitting: Anesthesiology

## 2018-12-08 ENCOUNTER — Ambulatory Visit
Admission: RE | Admit: 2018-12-08 | Discharge: 2018-12-08 | Disposition: A | Discharge status: Home / Self Care | Payer: Medicare HMO | Attending: Urology | Admitting: Urology

## 2018-12-08 ENCOUNTER — Encounter
Admission: RE | Disposition: A | Discharge status: Home / Self Care | Payer: Self-pay | Source: Home / Self Care | Attending: Urology

## 2018-12-08 ENCOUNTER — Encounter: Payer: Self-pay | Admitting: Anesthesiology

## 2018-12-08 ENCOUNTER — Other Ambulatory Visit: Payer: Self-pay

## 2018-12-08 DIAGNOSIS — Z791 Long term (current) use of non-steroidal anti-inflammatories (NSAID): Secondary | ICD-10-CM | POA: Diagnosis not present

## 2018-12-08 DIAGNOSIS — Z79899 Other long term (current) drug therapy: Secondary | ICD-10-CM | POA: Diagnosis not present

## 2018-12-08 DIAGNOSIS — N323 Diverticulum of bladder: Secondary | ICD-10-CM | POA: Insufficient documentation

## 2018-12-08 DIAGNOSIS — I2510 Atherosclerotic heart disease of native coronary artery without angina pectoris: Secondary | ICD-10-CM | POA: Diagnosis not present

## 2018-12-08 DIAGNOSIS — Z955 Presence of coronary angioplasty implant and graft: Secondary | ICD-10-CM | POA: Diagnosis not present

## 2018-12-08 DIAGNOSIS — Z87891 Personal history of nicotine dependence: Secondary | ICD-10-CM | POA: Insufficient documentation

## 2018-12-08 DIAGNOSIS — E785 Hyperlipidemia, unspecified: Secondary | ICD-10-CM | POA: Diagnosis not present

## 2018-12-08 DIAGNOSIS — Z7982 Long term (current) use of aspirin: Secondary | ICD-10-CM | POA: Insufficient documentation

## 2018-12-08 DIAGNOSIS — I252 Old myocardial infarction: Secondary | ICD-10-CM | POA: Insufficient documentation

## 2018-12-08 DIAGNOSIS — E119 Type 2 diabetes mellitus without complications: Secondary | ICD-10-CM | POA: Diagnosis not present

## 2018-12-08 DIAGNOSIS — I10 Essential (primary) hypertension: Secondary | ICD-10-CM | POA: Insufficient documentation

## 2018-12-08 DIAGNOSIS — C679 Malignant neoplasm of bladder, unspecified: Secondary | ICD-10-CM | POA: Diagnosis not present

## 2018-12-08 DIAGNOSIS — Z794 Long term (current) use of insulin: Secondary | ICD-10-CM | POA: Diagnosis not present

## 2018-12-08 DIAGNOSIS — C671 Malignant neoplasm of dome of bladder: Secondary | ICD-10-CM | POA: Diagnosis not present

## 2018-12-08 DIAGNOSIS — N3090 Cystitis, unspecified without hematuria: Secondary | ICD-10-CM | POA: Diagnosis not present

## 2018-12-08 DIAGNOSIS — E1151 Type 2 diabetes mellitus with diabetic peripheral angiopathy without gangrene: Secondary | ICD-10-CM | POA: Insufficient documentation

## 2018-12-08 DIAGNOSIS — K219 Gastro-esophageal reflux disease without esophagitis: Secondary | ICD-10-CM | POA: Diagnosis not present

## 2018-12-08 HISTORY — PX: CYSTOSCOPY WITH BIOPSY: SHX5122

## 2018-12-08 LAB — GLUCOSE, CAPILLARY
Glucose-Capillary: 245 mg/dL — ABNORMAL HIGH (ref 70–99)
Glucose-Capillary: 176 mg/dL — ABNORMAL HIGH (ref 70–99)

## 2018-12-08 SURGERY — CYSTOSCOPY, WITH BIOPSY
Anesthesia: General

## 2018-12-08 MED ORDER — FENTANYL CITRATE (PF) 100 MCG/2ML IJ SOLN
25.0000 ug | INTRAMUSCULAR | Status: DC | PRN
  Administered 2018-12-08 (×3): 25 ug via INTRAVENOUS

## 2018-12-08 MED ORDER — PROPOFOL 10 MG/ML IV BOLUS
INTRAVENOUS | Status: DC | PRN
  Administered 2018-12-08: 20 mg via INTRAVENOUS
  Administered 2018-12-08: 150 mg via INTRAVENOUS
  Administered 2018-12-08: 30 mg via INTRAVENOUS

## 2018-12-08 MED ORDER — ONDANSETRON HCL 4 MG/2ML IJ SOLN: 4.0000 mg | Freq: Once | INTRAMUSCULAR | Status: DC | PRN

## 2018-12-08 MED ORDER — ONDANSETRON HCL 4 MG/2ML IJ SOLN
INTRAMUSCULAR | Status: DC | PRN
  Administered 2018-12-08: 4 mg via INTRAVENOUS

## 2018-12-08 MED ORDER — BELLADONNA ALKALOIDS-OPIUM 16.2-60 MG RE SUPP
RECTAL | Status: DC | PRN
  Administered 2018-12-08: 1 via RECTAL

## 2018-12-08 MED ORDER — BELLADONNA ALKALOIDS-OPIUM 16.2-60 MG RE SUPP
RECTAL | Status: AC
  Filled 2018-12-08: qty 1

## 2018-12-08 MED ORDER — FAMOTIDINE 20 MG PO TABS
20.0000 mg | ORAL_TABLET | Freq: Once | ORAL | Status: AC
  Administered 2018-12-08: 20 mg via ORAL

## 2018-12-08 MED ORDER — FENTANYL CITRATE (PF) 100 MCG/2ML IJ SOLN
INTRAMUSCULAR | Status: AC
  Filled 2018-12-08: qty 2

## 2018-12-08 MED ORDER — LIDOCAINE HCL URETHRAL/MUCOSAL 2 % EX GEL
CUTANEOUS | Status: AC
  Filled 2018-12-08: qty 10

## 2018-12-08 MED ORDER — LIDOCAINE HCL (CARDIAC) PF 100 MG/5ML IV SOSY
PREFILLED_SYRINGE | INTRAVENOUS | Status: DC | PRN
  Administered 2018-12-08: 60 mg via INTRAVENOUS

## 2018-12-08 MED ORDER — LIDOCAINE HCL URETHRAL/MUCOSAL 2 % EX GEL
CUTANEOUS | Status: DC | PRN
  Administered 2018-12-08: 1

## 2018-12-08 MED ORDER — FENTANYL CITRATE (PF) 100 MCG/2ML IJ SOLN
INTRAMUSCULAR | Status: DC | PRN
  Administered 2018-12-08: 50 ug via INTRAVENOUS

## 2018-12-08 MED ORDER — PROPOFOL 10 MG/ML IV BOLUS
INTRAVENOUS | Status: AC
  Filled 2018-12-08: qty 20

## 2018-12-08 MED ORDER — MITOMYCIN CHEMO FOR BLADDER INSTILLATION 40 MG
INTRAVENOUS | Status: DC | PRN
  Administered 2018-12-08: 40 mg via INTRAVESICAL

## 2018-12-08 MED ORDER — FENTANYL CITRATE (PF) 100 MCG/2ML IJ SOLN
INTRAMUSCULAR | Status: AC
  Administered 2018-12-08: 25 ug via INTRAVENOUS
  Filled 2018-12-08: qty 2

## 2018-12-08 MED ORDER — MIDAZOLAM HCL 2 MG/2ML IJ SOLN
INTRAMUSCULAR | Status: AC
  Filled 2018-12-08: qty 2

## 2018-12-08 MED ORDER — SODIUM CHLORIDE 0.9 % IV SOLN
INTRAVENOUS | Status: DC
  Administered 2018-12-08: 13:00:00 via INTRAVENOUS

## 2018-12-08 MED ORDER — URIBEL 118 MG PO CAPS: 1.0000 | ORAL_CAPSULE | Freq: Four times a day (QID) | ORAL | 3 refills | Status: DC | PRN

## 2018-12-08 MED ORDER — LIDOCAINE HCL (PF) 2 % IJ SOLN
INTRAMUSCULAR | Status: AC
  Filled 2018-12-08: qty 10

## 2018-12-08 MED ORDER — CEFAZOLIN SODIUM-DEXTROSE 1-4 GM/50ML-% IV SOLN
1.0000 g | Freq: Once | INTRAVENOUS | Status: AC
  Administered 2018-12-08: 1 g via INTRAVENOUS

## 2018-12-08 MED ORDER — PHENYLEPHRINE HCL 10 MG/ML IJ SOLN
INTRAMUSCULAR | Status: DC | PRN
  Administered 2018-12-08: 100 ug via INTRAVENOUS
  Administered 2018-12-08: 200 ug via INTRAVENOUS
  Administered 2018-12-08: 100 ug via INTRAVENOUS

## 2018-12-08 MED ORDER — GLYCOPYRROLATE 0.2 MG/ML IJ SOLN
INTRAMUSCULAR | Status: DC | PRN
  Administered 2018-12-08 (×2): 0.1 mg via INTRAVENOUS

## 2018-12-08 MED ORDER — ONDANSETRON HCL 4 MG/2ML IJ SOLN
INTRAMUSCULAR | Status: AC
  Filled 2018-12-08: qty 2

## 2018-12-08 MED ORDER — FAMOTIDINE 20 MG PO TABS
ORAL_TABLET | ORAL | Status: AC
  Filled 2018-12-08: qty 1

## 2018-12-08 SURGICAL SUPPLY — 19 items
BAG DRAIN CYSTO-URO LG1000N (MISCELLANEOUS) ×3 IMPLANT
BAG URINE DRAINAGE (UROLOGICAL SUPPLIES) ×2 IMPLANT
CATH FOLEY 2WAY 18X30 (CATHETERS) IMPLANT
CATH FOLEY 2WAY SIL 18X30 (CATHETERS) ×3
DRSG TELFA 4X3 1S NADH ST (GAUZE/BANDAGES/DRESSINGS) ×3 IMPLANT
ELECT REM PT RETURN 9FT ADLT (ELECTROSURGICAL) ×3
ELECTRODE REM PT RTRN 9FT ADLT (ELECTROSURGICAL) ×1 IMPLANT
GLOVE BIO SURGEON STRL SZ7.5 (GLOVE) ×3 IMPLANT
GOWN STRL REUS W/ TWL LRG LVL3 (GOWN DISPOSABLE) ×1 IMPLANT
GOWN STRL REUS W/ TWL XL LVL3 (GOWN DISPOSABLE) ×1 IMPLANT
GOWN STRL REUS W/TWL LRG LVL3 (GOWN DISPOSABLE) ×2
GOWN STRL REUS W/TWL XL LVL3 (GOWN DISPOSABLE) ×2
KIT TURNOVER CYSTO (KITS) ×3 IMPLANT
NEEDLE HYPO 22GX1.5 SAFETY (NEEDLE) ×3 IMPLANT
PACK CYSTO AR (MISCELLANEOUS) ×3 IMPLANT
PLUG CATH AND CAP STER (CATHETERS) ×2 IMPLANT
SET IRRIG Y TYPE TUR BLADDER L (SET/KITS/TRAYS/PACK) ×3 IMPLANT
WATER STERILE IRR 1000ML POUR (IV SOLUTION) ×3 IMPLANT
WATER STERILE IRR 3000ML UROMA (IV SOLUTION) ×3 IMPLANT

## 2018-12-08 NOTE — Anesthesia Procedure Notes (Signed)
Procedure Name: LMA Insertion Date/Time: 12/08/2018 1:19 PM Performed by: Dionne Bucy, CRNA Pre-anesthesia Checklist: Patient identified, Patient being monitored, Timeout performed, Emergency Drugs available and Suction available Patient Re-evaluated:Patient Re-evaluated prior to induction Oxygen Delivery Method: Circle system utilized Preoxygenation: Pre-oxygenation with 100% oxygen Induction Type: IV induction Ventilation: Mask ventilation without difficulty LMA: LMA inserted LMA Size: 4.0 Tube type: Oral Number of attempts: 1 Placement Confirmation: positive ETCO2 and breath sounds checked- equal and bilateral Tube secured with: Tape Dental Injury: Teeth and Oropharynx as per pre-operative assessment

## 2018-12-08 NOTE — H&P (Signed)
Date of Initial H&P: 11/26/18  History reviewed, patient examined, no change in status, stable for surgery.

## 2018-12-08 NOTE — Anesthesia Postprocedure Evaluation (Signed)
Anesthesia Post Note  Patient: [name]  Procedure(s) Performed: CYSTOSCOPY WITH BLADDER BIOPSY-MITOMYCIN (N/A )  Patient location during evaluation: PACU Anesthesia Type: General Level of consciousness: awake and alert Pain management: pain level controlled Vital Signs Assessment: post-procedure vital signs reviewed and stable Respiratory status: spontaneous breathing, nonlabored ventilation, respiratory function stable and patient connected to nasal cannula oxygen Cardiovascular status: blood pressure returned to baseline and stable Postop Assessment: no apparent nausea or vomiting Anesthetic complications: no     Last Vitals:  Vitals:   12/08/18 1500 12/08/18 1523  BP:  127/61  Pulse: 67 70  Resp: 16 16  Temp:  (!) 36 C  SpO2: 97% 99%    Last Pain:  Vitals:   12/08/18 1523  TempSrc: Temporal  PainSc: 0-No pain                 Fatema Rabe S

## 2018-12-08 NOTE — Op Note (Signed)
Preoperative diagnosis: Bladder cancer  Postoperative diagnosis: Same  Procedure: Cystoscopy with bladder biopsy  Surgeon: Otelia Limes. Yves Dill MD  Anesthesia: General  Indications:See the history and physical. After informed consent the above procedure(s) were requested     Technique and findings: After adequate general anesthesia had been obtained the patient was placed into dorsal lithotomy position and the perineum was prepped and draped in the usual fashion.  The 75 French the scope was coupled to the camera and then advanced into the bladder.  The patient had a large posterior right bladder diverticulum.  Inspection of the diverticulum did not reveal any abnormalities or tumors.  There was a necrotic area on the left posterior wall of the bladder.  No bladder tumors were identified.  Both ureteral orifices were identified and had clear efflux.  At this point several cold cup biopsies were performed of the area of necrosis.  The biopsy sites were then cauterized with the Bugbee electrode.  The bladder was then drained and the cystoscope was removed.  10 cc of viscous Xylocaine was instilled within the urethra and bladder.  21 French Foley catheter was placed and irrigated until clear.  40 mg of mitomycin-C was then instilled within the bladder and a catheter plug placed.  A B&O suppository was placed.  The procedure was then terminated and patient transferred to the recovery room in stable condition.

## 2018-12-08 NOTE — OR Nursing (Signed)
Dr. Marcello Moores in to see pt 1248, aware basal off on glu monitor and current sugar.

## 2018-12-08 NOTE — Discharge Instructions (Addendum)
Bladder Biopsy  A bladder biopsy is a procedure to remove a small sample of tissue from the bladder. The procedure is done so that the tissue can be examined under a microscope. You may have a bladder biopsy to diagnose or rule out bladder cancer. During your bladder biopsy, your health care provider may insert a long, thin scope with a tiny lighted camera (cystoscope) into the tube that leads from your bladder to the outside of your body (urethra) and move it into your bladder. The cystoscope will allow your health care provider to examine the lining of the urethra and bladder and remove the tissue sample. If your health provider also needs to examine the tubes that connect the kidneys to the bladder (ureters), a longer tube (ureteroscope) may be used. Tell a health care provider about:  Any allergies you have.  All medicines you are taking, including vitamins, herbs, eye drops, creams, and over-the-counter medicines.  Any problems you or family members have had with anesthetic medicines.  Any blood disorders you have.  Any surgeries you have had.  Any medical conditions you have.  Whether you are pregnant or may be pregnant. What are the risks? Generally, this is a safe procedure. However, problems may occur, including:  Unusual bleeding.  Infection, especially a urinary tract infection (UTI).  Allergic reactions to medicines.  Damage to the surrounding organs, including the urethra, bladder, or ureters.  Abdominal pain.  Burning or pain during urination.  Narrowing of the urethra due to scar tissue.  Difficulty urinating due to swelling. What happens before the procedure?  You may be given antibiotic medicine to help prevent infection.  Ask your health care provider about: ? Changing or stopping your regular medicines. This is especially important if you are taking diabetes medicines or blood thinners. ? Taking medicines such as aspirin and ibuprofen. These medicines can  thin your blood. Do not take these medicines before your procedure if your health care provider instructs you not to.  Follow instructions from your health care provider about eating and drinking restrictions.  You may be asked to drink plenty of fluids.  You may be asked to urinate right before the procedure. You may have a urine sample taken for UTI testing.  Plan to have someone take you home from the hospital or clinic.  If you will be going home right after the procedure, plan to have someone with you for 24 hours. What happens during the procedure?  To lower your risk of infection: ? Your health care team will wash or sanitize their hands. ? Your skin will be washed with soap.  An IV tube may be inserted into one of your veins.  You may be given one or more of the following: ? A medicine to help you relax (sedative). ? A medicine to numb the opening of the urethra (local anesthetic). ? A medicine to make you fall asleep (general anesthetic). ? A medicine that is injected into your spine to numb the area below and slightly above the injection site (spinal anesthetic). ? A medicine that is injected into an area of your body to numb everything below the injection site (regional anesthetic).  You will lie on your back with your knees bent and spread apart.  The cystoscope or ureteroscope will be inserted into your urethra and guided into your bladder or ureters.  Your bladder may be slowly filled with germ-free (sterile) water. This will make it easier for your health care provider to view  the wall or lining of your bladder.  Small instruments will be inserted through the scope to collect a small tissue sample that will be examined under a microscope. The procedure may vary among health care providers and hospitals. What happens after the procedure?  You may be asked to empty your bladder, or your bladder may be emptied for you.  Your blood pressure, heart rate, breathing  rate, and blood oxygen level will be monitored until the medicines you were given wear off.  Do not drive for 24 hours if you received a sedative. This information is not intended to replace advice given to you by your health care provider. Make sure you discuss any questions you have with your health care provider. Document Released: 11/14/2015 Document Revised: 04/04/2016 Document Reviewed: 11/14/2015 Elsevier Interactive Patient Education  2019 [place]   1) The drugs that you were given will stay in your system until tomorrow so for the next 24 hours you should not:  A) Drive an automobile B) Make any legal decisions C) Drink any alcoholic beverage   2) You may resume regular meals tomorrow.  Today it is better to start with liquids and gradually work up to solid foods.  You may eat anything you prefer, but it is better to start with liquids, then soup and crackers, and gradually work up to solid foods.   3) Please notify your doctor immediately if you have any unusual bleeding, trouble breathing, redness and pain at the surgery site, drainage, fever, or pain not relieved by medication.    4) Additional Instructions:        Please contact your physician with any problems or Same Day Surgery at [phone], Monday through Friday 6 am to 4 pm, or [place] at [facility] number at [phone].

## 2018-12-08 NOTE — Anesthesia Post-op Follow-up Note (Signed)
Anesthesia QCDR form completed.        

## 2018-12-08 NOTE — Anesthesia Preprocedure Evaluation (Signed)
Anesthesia Evaluation  Patient identified by MRN, date of birth, ID band Patient awake    Reviewed: Allergy & Precautions, NPO status , Patient's Chart, lab work & pertinent test results, reviewed documented beta blocker date and time   Airway Mallampati: II  TM Distance: >3 FB     Dental  (+) Chipped   Pulmonary former smoker,           Cardiovascular hypertension, Pt. on medications + CAD, + Past MI, + Cardiac Stents and + Peripheral Vascular Disease       Neuro/Psych    GI/Hepatic GERD  Controlled,  Endo/Other  diabetes, Type 2  Renal/GU      Musculoskeletal  (+) Arthritis ,   Abdominal   Peds  Hematology  (+) anemia ,   Anesthesia Other Findings EKG ok.  Reproductive/Obstetrics                             Anesthesia Physical Anesthesia Plan  ASA: III  Anesthesia Plan: General   Post-op Pain Management:    Induction: Intravenous  PONV Risk Score and Plan:   Airway Management Planned: LMA  Additional Equipment:   Intra-op Plan:   Post-operative Plan:   Informed Consent: I have reviewed the patients History and Physical, chart, labs and discussed the procedure including the risks, benefits and alternatives for the proposed anesthesia with the patient or authorized representative who has indicated his/her understanding and acceptance.       Plan Discussed with: CRNA  Anesthesia Plan Comments:         Anesthesia Quick Evaluation

## 2018-12-08 NOTE — Transfer of Care (Signed)
Immediate Anesthesia Transfer of Care Note  Patient: [name]  Procedure(s) Performed: CYSTOSCOPY WITH BLADDER BIOPSY-MITOMYCIN (N/A )  Patient Location: PACU  Anesthesia Type:General  Level of Consciousness: sedated  Airway & Oxygen Therapy: Patient Spontanous Breathing  Post-op Assessment: Report given to RN  Post vital signs: stable  Last Vitals:  Vitals Value Taken Time  BP 106/57 12/08/2018  2:06 PM  Temp 36.2 C 12/08/2018  2:06 PM  Pulse 69 12/08/2018  2:15 PM  Resp 13 12/08/2018  2:15 PM  SpO2 95 % 12/08/2018  2:15 PM  Vitals shown include unvalidated device data.  Last Pain:  Vitals:   12/08/18 1406  TempSrc:   PainSc: Asleep         Complications: No apparent anesthesia complications

## 2018-12-09 ENCOUNTER — Encounter: Payer: Self-pay | Admitting: Urology

## 2018-12-10 LAB — SURGICAL PATHOLOGY

## 2018-12-11 DIAGNOSIS — E109 Type 1 diabetes mellitus without complications: Secondary | ICD-10-CM | POA: Diagnosis not present

## 2018-12-16 ENCOUNTER — Other Ambulatory Visit: Payer: Self-pay

## 2018-12-16 ENCOUNTER — Inpatient Hospital Stay
Admission: EM | Admit: 2018-12-16 | Discharge: 2018-12-18 | DRG: 919 | Disposition: A | Discharge status: Home / Self Care | Payer: Medicare HMO | Attending: Internal Medicine | Admitting: Internal Medicine

## 2018-12-16 ENCOUNTER — Encounter: Payer: Self-pay | Admitting: Emergency Medicine

## 2018-12-16 DIAGNOSIS — R809 Proteinuria, unspecified: Secondary | ICD-10-CM | POA: Diagnosis not present

## 2018-12-16 DIAGNOSIS — Z87891 Personal history of nicotine dependence: Secondary | ICD-10-CM

## 2018-12-16 DIAGNOSIS — E785 Hyperlipidemia, unspecified: Secondary | ICD-10-CM | POA: Diagnosis present

## 2018-12-16 DIAGNOSIS — N179 Acute kidney failure, unspecified: Secondary | ICD-10-CM | POA: Diagnosis not present

## 2018-12-16 DIAGNOSIS — I255 Ischemic cardiomyopathy: Secondary | ICD-10-CM | POA: Diagnosis present

## 2018-12-16 DIAGNOSIS — K210 Gastro-esophageal reflux disease with esophagitis: Secondary | ICD-10-CM | POA: Diagnosis present

## 2018-12-16 DIAGNOSIS — I10 Essential (primary) hypertension: Secondary | ICD-10-CM | POA: Diagnosis present

## 2018-12-16 DIAGNOSIS — E875 Hyperkalemia: Secondary | ICD-10-CM | POA: Diagnosis present

## 2018-12-16 DIAGNOSIS — I2510 Atherosclerotic heart disease of native coronary artery without angina pectoris: Secondary | ICD-10-CM | POA: Diagnosis present

## 2018-12-16 DIAGNOSIS — E860 Dehydration: Secondary | ICD-10-CM | POA: Diagnosis present

## 2018-12-16 DIAGNOSIS — E1029 Type 1 diabetes mellitus with other diabetic kidney complication: Secondary | ICD-10-CM | POA: Diagnosis not present

## 2018-12-16 DIAGNOSIS — Z8249 Family history of ischemic heart disease and other diseases of the circulatory system: Secondary | ICD-10-CM

## 2018-12-16 DIAGNOSIS — Z7982 Long term (current) use of aspirin: Secondary | ICD-10-CM | POA: Diagnosis not present

## 2018-12-16 DIAGNOSIS — N170 Acute kidney failure with tubular necrosis: Secondary | ICD-10-CM | POA: Diagnosis not present

## 2018-12-16 DIAGNOSIS — I252 Old myocardial infarction: Secondary | ICD-10-CM | POA: Diagnosis not present

## 2018-12-16 DIAGNOSIS — D649 Anemia, unspecified: Secondary | ICD-10-CM | POA: Diagnosis present

## 2018-12-16 DIAGNOSIS — Z8551 Personal history of malignant neoplasm of bladder: Secondary | ICD-10-CM

## 2018-12-16 DIAGNOSIS — Z79899 Other long term (current) drug therapy: Secondary | ICD-10-CM

## 2018-12-16 DIAGNOSIS — Z955 Presence of coronary angioplasty implant and graft: Secondary | ICD-10-CM

## 2018-12-16 DIAGNOSIS — E1111 Type 2 diabetes mellitus with ketoacidosis with coma: Secondary | ICD-10-CM | POA: Diagnosis not present

## 2018-12-16 DIAGNOSIS — I959 Hypotension, unspecified: Secondary | ICD-10-CM | POA: Diagnosis not present

## 2018-12-16 DIAGNOSIS — M1990 Unspecified osteoarthritis, unspecified site: Secondary | ICD-10-CM | POA: Diagnosis present

## 2018-12-16 DIAGNOSIS — E1059 Type 1 diabetes mellitus with other circulatory complications: Secondary | ICD-10-CM | POA: Diagnosis not present

## 2018-12-16 DIAGNOSIS — E1065 Type 1 diabetes mellitus with hyperglycemia: Secondary | ICD-10-CM | POA: Diagnosis not present

## 2018-12-16 DIAGNOSIS — Z9641 Presence of insulin pump (external) (internal): Secondary | ICD-10-CM | POA: Diagnosis not present

## 2018-12-16 DIAGNOSIS — E103212 Type 1 diabetes mellitus with mild nonproliferative diabetic retinopathy with macular edema, left eye: Secondary | ICD-10-CM | POA: Diagnosis not present

## 2018-12-16 DIAGNOSIS — R112 Nausea with vomiting, unspecified: Secondary | ICD-10-CM | POA: Diagnosis present

## 2018-12-16 DIAGNOSIS — R1310 Dysphagia, unspecified: Secondary | ICD-10-CM | POA: Diagnosis not present

## 2018-12-16 DIAGNOSIS — Z794 Long term (current) use of insulin: Secondary | ICD-10-CM

## 2018-12-16 DIAGNOSIS — N3000 Acute cystitis without hematuria: Secondary | ICD-10-CM | POA: Diagnosis not present

## 2018-12-16 DIAGNOSIS — Z85828 Personal history of other malignant neoplasm of skin: Secondary | ICD-10-CM | POA: Diagnosis not present

## 2018-12-16 DIAGNOSIS — Y742 Prosthetic and other implants, materials and accessory general hospital and personal-use devices associated with adverse incidents: Secondary | ICD-10-CM | POA: Diagnosis present

## 2018-12-16 DIAGNOSIS — T85694A Other mechanical complication of insulin pump, initial encounter: Principal | ICD-10-CM | POA: Diagnosis present

## 2018-12-16 DIAGNOSIS — R490 Dysphonia: Secondary | ICD-10-CM | POA: Diagnosis present

## 2018-12-16 DIAGNOSIS — R531 Weakness: Secondary | ICD-10-CM | POA: Diagnosis not present

## 2018-12-16 DIAGNOSIS — E1042 Type 1 diabetes mellitus with diabetic polyneuropathy: Secondary | ICD-10-CM | POA: Diagnosis not present

## 2018-12-16 DIAGNOSIS — R1111 Vomiting without nausea: Secondary | ICD-10-CM | POA: Diagnosis not present

## 2018-12-16 DIAGNOSIS — E1010 Type 1 diabetes mellitus with ketoacidosis without coma: Secondary | ICD-10-CM | POA: Diagnosis not present

## 2018-12-16 DIAGNOSIS — E109 Type 1 diabetes mellitus without complications: Secondary | ICD-10-CM | POA: Diagnosis not present

## 2018-12-16 DIAGNOSIS — N390 Urinary tract infection, site not specified: Secondary | ICD-10-CM

## 2018-12-16 DIAGNOSIS — E1051 Type 1 diabetes mellitus with diabetic peripheral angiopathy without gangrene: Secondary | ICD-10-CM | POA: Diagnosis present

## 2018-12-16 DIAGNOSIS — E1110 Type 2 diabetes mellitus with ketoacidosis without coma: Secondary | ICD-10-CM | POA: Diagnosis present

## 2018-12-16 DIAGNOSIS — R0902 Hypoxemia: Secondary | ICD-10-CM | POA: Diagnosis not present

## 2018-12-16 LAB — CBC
HCT: 37.3 % — ABNORMAL LOW (ref 39.0–52.0)
Hemoglobin: 12.2 g/dL — ABNORMAL LOW (ref 13.0–17.0)
MCH: 30.9 pg (ref 26.0–34.0)
MCHC: 32.7 g/dL (ref 30.0–36.0)
MCV: 94.4 fL (ref 80.0–100.0)
Platelets: 320 10*3/uL (ref 150–400)
RBC: 3.95 MIL/uL — ABNORMAL LOW (ref 4.22–5.81)
RDW: 14.1 % (ref 11.5–15.5)
WBC: 12.2 10*3/uL — ABNORMAL HIGH (ref 4.0–10.5)
nRBC: 0 % (ref 0.0–0.2)

## 2018-12-16 LAB — BASIC METABOLIC PANEL
Anion gap: 9 (ref 5–15)
BUN: 45 mg/dL — ABNORMAL HIGH (ref 8–23)
CO2: 22 mmol/L (ref 22–32)
Calcium: 8.3 mg/dL — ABNORMAL LOW (ref 8.9–10.3)
Chloride: 110 mmol/L (ref 98–111)
Creatinine, Ser: 1.55 mg/dL — ABNORMAL HIGH (ref 0.61–1.24)
GFR calc Af Amer: 51 mL/min — ABNORMAL LOW (ref >60)
GFR calc non Af Amer: 44 mL/min — ABNORMAL LOW (ref >60)
Glucose, Bld: 349 mg/dL — ABNORMAL HIGH (ref 70–99)
Potassium: 4 mmol/L (ref 3.5–5.1)
Sodium: 141 mmol/L (ref 135–145)

## 2018-12-16 LAB — GLUCOSE, CAPILLARY
Glucose-Capillary: 470 mg/dL — ABNORMAL HIGH (ref 70–99)
Glucose-Capillary: 459 mg/dL — ABNORMAL HIGH (ref 70–99)
Glucose-Capillary: 395 mg/dL — ABNORMAL HIGH (ref 70–99)
Glucose-Capillary: 316 mg/dL — ABNORMAL HIGH (ref 70–99)
Glucose-Capillary: 299 mg/dL — ABNORMAL HIGH (ref 70–99)

## 2018-12-16 LAB — URINALYSIS, COMPLETE (UACMP) WITH MICROSCOPIC
Bilirubin Urine: NEGATIVE
Ketones, ur: 80 mg/dL — AB
Nitrite: NEGATIVE
PH: 5 (ref 5.0–8.0)
Protein, ur: 30 mg/dL — AB
RBC / HPF: >50 RBC/hpf — ABNORMAL HIGH (ref 0–5)
Specific Gravity, Urine: 1.02 (ref 1.005–1.030)
WBC, UA: >50 WBC/hpf — ABNORMAL HIGH (ref 0–5)

## 2018-12-16 LAB — COMPREHENSIVE METABOLIC PANEL
ALT: 26 U/L (ref 0–44)
ANION GAP: 20 — AB (ref 5–15)
AST: 28 U/L (ref 15–41)
Albumin: 4.1 g/dL (ref 3.5–5.0)
Alkaline Phosphatase: 81 U/L (ref 38–126)
BUN: 48 mg/dL — ABNORMAL HIGH (ref 8–23)
CO2: 18 mmol/L — ABNORMAL LOW (ref 22–32)
Calcium: 9.3 mg/dL (ref 8.9–10.3)
Chloride: 99 mmol/L (ref 98–111)
Creatinine, Ser: 1.53 mg/dL — ABNORMAL HIGH (ref 0.61–1.24)
GFR calc Af Amer: 52 mL/min — ABNORMAL LOW (ref >60)
GFR calc non Af Amer: 45 mL/min — ABNORMAL LOW (ref >60)
Glucose, Bld: 497 mg/dL — ABNORMAL HIGH (ref 70–99)
Potassium: 5.4 mmol/L — ABNORMAL HIGH (ref 3.5–5.1)
Sodium: 137 mmol/L (ref 135–145)
Total Bilirubin: 2.3 mg/dL — ABNORMAL HIGH (ref 0.3–1.2)
Total Protein: 7.2 g/dL (ref 6.5–8.1)

## 2018-12-16 LAB — BLOOD GAS, VENOUS
Acid-base deficit: 8.3 mmol/L — ABNORMAL HIGH (ref 0.0–2.0)
Bicarbonate: 16.2 mmol/L — ABNORMAL LOW (ref 20.0–28.0)
O2 Saturation: 62.9 %
PH VEN: 7.34 (ref 7.250–7.430)
Patient temperature: 37
pCO2, Ven: 30 mmHg — ABNORMAL LOW (ref 44.0–60.0)
pO2, Ven: 35 mmHg (ref 32.0–45.0)

## 2018-12-16 LAB — LIPASE, BLOOD: LIPASE: 15 U/L (ref 11–51)

## 2018-12-16 LAB — MRSA PCR SCREENING: MRSA by PCR: NEGATIVE

## 2018-12-16 MED ORDER — ALUM & MAG HYDROXIDE-SIMETH 200-200-20 MG/5ML PO SUSP
30.0000 mL | Freq: Once | ORAL | Status: AC
  Administered 2018-12-16: 30 mL via ORAL
  Filled 2018-12-16: qty 30

## 2018-12-16 MED ORDER — LEVOFLOXACIN IN D5W 500 MG/100ML IV SOLN
500.0000 mg | Freq: Once | INTRAVENOUS | Status: AC
  Administered 2018-12-16: 500 mg via INTRAVENOUS
  Filled 2018-12-16: qty 100

## 2018-12-16 MED ORDER — SODIUM CHLORIDE 0.9 % IV SOLN
1.0000 g | INTRAVENOUS | Status: DC
  Filled 2018-12-16: qty 10

## 2018-12-16 MED ORDER — ONDANSETRON HCL 4 MG/2ML IJ SOLN
4.0000 mg | Freq: Once | INTRAMUSCULAR | Status: AC
  Administered 2018-12-16: 4 mg via INTRAVENOUS
  Filled 2018-12-16: qty 2

## 2018-12-16 MED ORDER — SODIUM CHLORIDE 0.9 % IV SOLN
INTRAVENOUS | Status: DC
  Administered 2018-12-16: 22:00:00 via INTRAVENOUS

## 2018-12-16 MED ORDER — PROMETHAZINE HCL 25 MG/ML IJ SOLN
12.5000 mg | Freq: Once | INTRAMUSCULAR | Status: AC
  Administered 2018-12-16: 12.5 mg via INTRAVENOUS

## 2018-12-16 MED ORDER — SODIUM CHLORIDE 0.9 % IV BOLUS
1000.0000 mL | Freq: Once | INTRAVENOUS | Status: AC
  Administered 2018-12-16: 1000 mL via INTRAVENOUS

## 2018-12-16 MED ORDER — PROMETHAZINE HCL 25 MG/ML IJ SOLN: 12.5000 mg | Freq: Four times a day (QID) | INTRAMUSCULAR | Status: DC | PRN

## 2018-12-16 MED ORDER — ENOXAPARIN SODIUM 40 MG/0.4ML ~~LOC~~ SOLN
40.0000 mg | SUBCUTANEOUS | Status: DC
  Administered 2018-12-16 – 2018-12-17 (×2): 40 mg via SUBCUTANEOUS
  Filled 2018-12-16 (×2): qty 0.4

## 2018-12-16 MED ORDER — LIDOCAINE VISCOUS HCL 2 % MT SOLN
15.0000 mL | Freq: Once | OROMUCOSAL | Status: AC
  Administered 2018-12-16: 15 mL via ORAL
  Filled 2018-12-16: qty 15

## 2018-12-16 MED ORDER — SODIUM CHLORIDE 0.9 % IV SOLN
INTRAVENOUS | Status: AC
  Administered 2018-12-16: 21:00:00 via INTRAVENOUS

## 2018-12-16 MED ORDER — SODIUM CHLORIDE 0.9 % IV SOLN
1.0000 g | Freq: Once | INTRAVENOUS | Status: AC
  Administered 2018-12-16: 1 g via INTRAVENOUS
  Filled 2018-12-16: qty 10

## 2018-12-16 MED ORDER — TAMSULOSIN HCL 0.4 MG PO CAPS
0.4000 mg | ORAL_CAPSULE | Freq: Every day | ORAL | Status: DC
  Administered 2018-12-16 – 2018-12-18 (×3): 0.4 mg via ORAL
  Filled 2018-12-16 (×4): qty 1

## 2018-12-16 MED ORDER — ONDANSETRON HCL 4 MG/2ML IJ SOLN: 4.0000 mg | Freq: Four times a day (QID) | INTRAMUSCULAR | Status: DC | PRN

## 2018-12-16 MED ORDER — DEXTROSE-NACL 5-0.45 % IV SOLN: INTRAVENOUS | Status: DC

## 2018-12-16 MED ORDER — INSULIN REGULAR(HUMAN) IN NACL 100-0.9 UT/100ML-% IV SOLN
INTRAVENOUS | Status: DC
  Administered 2018-12-16: 4.1 [IU]/h via INTRAVENOUS
  Filled 2018-12-16: qty 100

## 2018-12-16 MED ORDER — DEXTROSE-NACL 5-0.45 % IV SOLN
INTRAVENOUS | Status: DC
  Administered 2018-12-16: 23:00:00 via INTRAVENOUS

## 2018-12-16 MED ORDER — PANTOPRAZOLE SODIUM 40 MG IV SOLR
40.0000 mg | INTRAVENOUS | Status: DC
  Administered 2018-12-16 – 2018-12-17 (×2): 40 mg via INTRAVENOUS
  Filled 2018-12-16 (×2): qty 40

## 2018-12-16 NOTE — Progress Notes (Signed)
Family Meeting Note  Advance Directive:yes  Today a meeting took place with the Patient.    The following clinical team members were present during this meeting:MD  The following were discussed:Patient's diagnosis: DKA, dehydration, acute kidney injury, urinary tract infection, nausea and vomiting, hyperkalemia, hypertension, hyperlipidemia, status post IV fluid bolus, started on DKA protocol with insulin drip is getting admitted to the hospital.  Treatment plan of care discussed in detail with the patient and his partner at bedside.  They both verbalized understanding of the plan.    Patient's progosis: Unable to determine and Goals for treatment: Full Code, healthcare POA his partner Saralyn Pilar  Additional follow-up to be provided: Hospitalist  Time spent during discussion:17 min  Nicholes Mango, MD

## 2018-12-16 NOTE — Progress Notes (Signed)
[name] Progress Note Patient Name: [name] DOB: Dec 24, 1945 MRN: [id number]   Date of Service  12/16/2018  HPI/Events of Note  Pt admitted to the ICU through the [facility] ED with DKA and AKI due to volume depletion. He also is suspected to have a UTI. I agree with work up and Rx as outlined.  eICU Interventions  New patient evaluation completed.        Kerry Kass Ogan 12/16/2018, 9:16 PM

## 2018-12-16 NOTE — ED Notes (Signed)
Insulin dose change verified by Annie Main, RN.

## 2018-12-16 NOTE — H&P (Signed)
[place] at [place] NAME: [name]    MR#:  [id number]  DATE OF BIRTH:  Apr 08, 1946  DATE OF ADMISSION:  12/16/2018  PRIMARY CARE PHYSICIAN: [name], MD   REQUESTING/REFERRING PHYSICIAN: Harvest Dark, MD  CHIEF COMPLAINT:   High sugars HISTORY OF PRESENT ILLNESS:  [name]  is a 73 y.o. male with a known history of diabetes mellitus, hypertension, hyperlipidemia is presenting to the ED with a chief complaint of high sugars.  Patient has been feeling lightheaded and dizzy associated with nausea vomiting since this morning.  His blood sugar was at around 450.  Patient has been using insulin pump for 1 month and believes the pump is malfunctioning.  Patient denies any chest pain or shortness of breath but when he went to his doctor's office today he was almost passed out after he vomited.  His partner at bedside.  Patient is started on insulin drip, 1 L fluid bolus was given.  PAST MEDICAL HISTORY:   Past Medical History:  Diagnosis Date  . Anemia   . Arthritis    right knee  . Cancer [facility])    BLADDER CANCER (2018)skin cancer  . Coronary artery disease   . Diabetes mellitus   . Edema   . GERD (gastroesophageal reflux disease)    rare-NO MEDS  . Heart attack ([place]) 2007  . Hyperlipidemia   . Hypertension   . Hypoglycemia    history of episodes x2  . Ischemic cardiomyopathy   . Myocardial infarction ([place])   . Peripheral vascular disease ([name])    swelling in Lt ankle due to prior injury    PAST SURGICAL HISTOIRY:   Past Surgical History:  Procedure Laterality Date  . CARDIAC CATHETERIZATION    . COLONOSCOPY WITH PROPOFOL N/A 02/18/2018   Procedure: COLONOSCOPY WITH PROPOFOL;  Surgeon: Toledo, Benay Pike, MD;  Location: [facility] ENDOSCOPY;  Service: Gastroenterology;  Laterality: N/A;  . CORONARY ANGIOPLASTY    . CORONARY STENT PLACEMENT     To an occluded LAD x2  . CYSTOSCOPY WITH BIOPSY N/A  10/21/2017   Procedure: CYSTOSCOPY WITH BLADDER BIOPSY;  Surgeon: Royston Cowper, MD;  Location: [facility] ORS;  Service: Urology;  Laterality: N/A;  . CYSTOSCOPY WITH BIOPSY N/A 12/08/2018   Procedure: CYSTOSCOPY WITH BLADDER BIOPSY-MITOMYCIN;  Surgeon: Royston Cowper, MD;  Location: [facility] ORS;  Service: Urology;  Laterality: N/A;  . EXCISION MASS LOWER EXTREMETIES Right 06/05/2016   Procedure: EXCISION OF LESION RIGHT LOWER LEG;  Surgeon: Hubbard Robinson, MD;  Location: [facility] ORS;  Service: General;  Laterality: Right;  . SKIN CANCER EXCISION     chest  . TRANSURETHRAL RESECTION OF BLADDER TUMOR N/A 07/15/2017   Procedure: TRANSURETHRAL RESECTION OF BLADDER TUMOR (TURBT);  Surgeon: Royston Cowper, MD;  Location: [facility] ORS;  Service: Urology;  Laterality: N/A;    SOCIAL HISTORY:   Social History   Tobacco Use  . Smoking status: Former Smoker    Packs/day: 1.00    Years: 10.00    Pack years: 10.00    Types: Cigarettes    Last attempt to quit: 06/08/1995    Years since quitting: 23.5  . Smokeless tobacco: Never Used  Substance Use Topics  . Alcohol use: No    Alcohol/week: 0.0 standard drinks    FAMILY HISTORY:   Family History  Problem Relation Age of Onset  . Heart disease Mother   . Heart attack Mother   . Heart  disease Father   . Prostate cancer Brother     DRUG ALLERGIES:  No Known Allergies  REVIEW OF SYSTEMS:  CONSTITUTIONAL: No fever, fatigue, reporting generalized weakness.  EYES: No blurred or double vision.  EARS, NOSE, AND THROAT: No tinnitus or ear pain.  RESPIRATORY: No cough, shortness of breath, wheezing or hemoptysis.  CARDIOVASCULAR: No chest pain, orthopnea, edema.  GASTROINTESTINAL: Endorses nausea, vomiting, denies diarrhea or abdominal pain.  GENITOURINARY: No dysuria, hematuria.  ENDOCRINE: Reporting polyuria, nocturia,  HEMATOLOGY: No anemia, easy bruising or bleeding SKIN: No rash or lesion. MUSCULOSKELETAL: No joint pain or arthritis.    NEUROLOGIC: No tingling, numbness, weakness.  PSYCHIATRY: No anxiety or depression.   MEDICATIONS AT HOME:   Prior to Admission medications   Medication Sig Start Date End Date Taking? Authorizing Provider  aspirin EC 81 MG tablet Take 81 mg by mouth daily.   Yes [provider]  atorvastatin (LIPITOR) 80 MG tablet TAKE 1 TABLET BY MOUTH AT BEDTIME Patient taking differently: Take 80 mg by mouth at bedtime.  07/06/18  Yes [name], MD  carvedilol (COREG) 12.5 MG tablet take 12.5 mg by mouth twice a day with food Patient taking differently: Take 12.5 mg by mouth 2 (two) times daily with a meal.  02/16/18  Yes Gollan, Kathlene November, MD  hydrocortisone (ANUSOL-HC) 2.5 % rectal cream Apply 1 application topically 2 (two) times daily. 09/22/18  Yes [provider]  losartan (COZAAR) 25 MG tablet Take 25 mg by mouth daily.    Yes [provider]  Multiple Vitamins-Minerals (CENTRUM SILVER PO) Take 1 tablet by mouth daily.   Yes [provider]  NOVOLOG 100 UNIT/ML injection See admin instructions. Uses via inuslin pump 03/14/16  Yes [provider]  tamsulosin (FLOMAX) 0.4 MG CAPS capsule Take 0.4 mg by mouth daily.   Yes [provider]  glucagon (GLUCAGON EMERGENCY) 1 MG injection Inject 1 mg into the skin once as needed (for low blood sugar).     [provider]  Meth-Hyo-M Bl-Na Phos-Ph Sal (URIBEL) 118 MG CAPS Take 1 capsule (118 mg total) by mouth every 6 (six) hours as needed (dysuria). Patient not taking: Reported on 12/16/2018 12/08/18   Royston Cowper, MD  mupirocin ointment (BACTROBAN) 2 % Apply 1 application topically 3 (three) times daily. 07/30/18   [provider]  naproxen sodium (ANAPROX) 220 MG tablet Take 220 mg by mouth daily as needed (for pain or headache).     [provider]  nitroGLYCERIN (NITROSTAT) 0.4 MG SL tablet Place 0.4 mg under the tongue every 5 (five) minutes as needed for chest pain.  Do not exceed 3 doses in 15 minutes.     [provider]      VITAL SIGNS:  Blood pressure 138/62, pulse 86, temperature 98.1 F (36.7 C), temperature source Oral, resp. rate (!) 22, height 5\' 9"  (1.753 m), weight 82.6 kg, SpO2 97 %.  PHYSICAL EXAMINATION:  GENERAL:  73 y.o.-year-old patient lying in the bed with no acute distress.  EYES: Pupils equal, round, reactive to light and accommodation. No scleral icterus. Extraocular muscles intact.  HEENT: Head atraumatic, normocephalic. Oropharynx and nasopharynx clear.  Dry mucous membranes NECK:  Supple, no jugular venous distention. No thyroid enlargement, no tenderness.  LUNGS: Normal breath sounds bilaterally, no wheezing, rales,rhonchi or crepitation. No use of accessory muscles of respiration.  CARDIOVASCULAR: S1, S2 normal. No murmurs, rubs, or gallops.  ABDOMEN: Soft, nontender, nondistended. Bowel sounds present.  No organomegaly or mass.  EXTREMITIES: No pedal edema, cyanosis, or clubbing.  NEUROLOGIC: Awake alert and oriented x3. Sensation intact. Gait not checked.  PSYCHIATRIC: The patient is alert and oriented x 3.  SKIN: No obvious rash, lesion, or ulcer.   LABORATORY PANEL:   CBC Recent Labs  Lab 12/16/18 1649  WBC 12.2*  HGB 12.2*  HCT 37.3*  PLT 320   ------------------------------------------------------------------------------------------------------------------  Chemistries  Recent Labs  Lab 12/16/18 1649  NA 137  K 5.4*  CL 99  CO2 18*  GLUCOSE 497*  BUN 48*  CREATININE 1.53*  CALCIUM 9.3  AST 28  ALT 26  ALKPHOS 81  BILITOT 2.3*   ------------------------------------------------------------------------------------------------------------------  Cardiac Enzymes No results for input(s): TROPONINI in the last 168 hours. ------------------------------------------------------------------------------------------------------------------  RADIOLOGY:  No results found.  EKG:   Orders  placed or performed during the hospital encounter of 12/16/18  . EKG 12-Lead  . EKG 12-Lead  . EKG 12-Lead  . EKG 12-Lead    IMPRESSION AND PLAN:     #DKA Admit to stepdown unit Insulin drip per DKA protocol Aggressive hydration with IV fluids Serial BMPs Urine positive for ketones Hemoglobin A1c check Consult diabetic coordinator N.p.o.  #Hyperkalemia Aggressive hydration with IV fluids and with IV insulin it should be automatically corrected we will check serial BMPs  #Acute cystitis with nausea and vomiting Urine culture and sensitivity IV fluids and IV Rocephin Supportive treatment and antiemetics N.p.o. for now  #Acute kidney injury secondary to dehydration from DKA Hydrate with IV fluids and hold nephrotoxins Check a.m. labs  #Hyperlipidemia Currently patient is n.p.o. hold home p.o. medications  #Essential hypertension Blood pressure is stable continue hydration with IV fluids and hold p.o. meds  DVT prophylaxis with Lovenox subcu GI prophylaxis with IV Protonix  All the records are reviewed and case discussed with ED provider. Management plans discussed with the patient, family and they are in agreement.  CODE STATUS: fc   TOTAL TIME TAKING CARE OF THIS PATIENT: 45  minutes.   Note: This dictation was prepared with Dragon dictation along with smaller phrase technology. Any transcriptional errors that result from this process are unintentional.  Nicholes Mango M.D on 12/16/2018 at 7:23 PM  Between 7am to 6pm - Pager - [phone]  After 6pm go to [email or web address] - password [email or web address] [facility]  [place] [facility]  Office  [phone]  CC: Primary care physician; [name], MD

## 2018-12-16 NOTE — ED Triage Notes (Signed)
Pt in via [facility] from home, reports malfunction w/ Insulin pump over the last few days.  Pt reports seeing his Endocrinologist today who advised him to use injections until receiving another insulin pump.  Pt report N/V, and one syncopal episode while at the doctors office.  Vitals WDL.  EDP to bedside at this time.

## 2018-12-16 NOTE — Consult Note (Signed)
Name: [name] MRN: [id number] DOB: September 16, 1946    ADMISSION DATE:  12/16/2018 CONSULTATION DATE:  12/16/2018  REFERRING MD :  Dr. Margaretmary Eddy  CHIEF COMPLAINT:  Hyperglycemia  BRIEF PATIENT DESCRIPTION:  73 y.o. Male admitted with DKA secondary to possible malfunctioning insulin pump, AKI secondary to dehydration, and UTI  SIGNIFICANT EVENTS  12/16/18>> Admission to [facility]:   CULTURES: Urine 2/5>>  ANTIBIOTICS: Rocephin 2/5>> Levaquin 2/5 x1 dose  HISTORY OF PRESENT ILLNESS:   Mr. [name] is a 73 y.o. Male with a PMH of Diabetes Mellitus (recently placed on insulin pump approximately. 1 month ago), Hypoglycemia,  CAD, MI, PVD, ischemic cardiomyopathy, GERD, HTN, and HLD who presents to [facility] ED on 12/16/18 with c/o hyperglycemia, lightheadedness, nausea, and vomiting.  He was at an appointment with his Endocrinologist Dr. Gabriel Carina earlier today for recent history of hyperglycemia, where he nearly passed out with vomiting episode, of which he was sent to the ED for further evaluation.  He reports that he had a bladder scope performed approximately 1 week ago, and since that time his blood glucoses have been elevated.  He admits to about a one day history of polyuria, polydipsia, polyphagia, dysuria, nausea, and vomiting.  He shares the concern that he feels like his insulin pump has not been functioning correctly.  Initial workup in the ED revealed Glucose 497, Anion gap 20, serum bicarb 18, Potassium 5.4, Creatinine 1.53, WBC 12.2. Venous Blood gas with pH 7.34 / CO2 30 / O2 35 / Bicarb 16.2.  Urinalysis is positive for ketones and for UTI.  In the ED he received 2L Normal saline boluses, was placed on insulin drip, and received Levaquin and Rocephin.  He is being admitted to [facility] unit for treatment of DKA, AKI secondary to dehydration, Hyperkalemia, and Urinary Tract Infection.  PCCM is consulted for further management.  PAST MEDICAL HISTORY :   has a past  medical history of Anemia, Arthritis, Cancer ([place]), Coronary artery disease, Diabetes mellitus, Edema, GERD (gastroesophageal reflux disease), Heart attack ([place]) (2007), Hyperlipidemia, Hypertension, Hypoglycemia, Ischemic cardiomyopathy, Myocardial infarction [facility]), and Peripheral vascular disease ([place]).  has a past surgical history that includes Cardiac catheterization; Coronary stent placement; Skin cancer excision; Coronary angioplasty; Excision mass lower extremeties (Right, 06/05/2016); Transurethral resection of bladder tumor (N/A, 07/15/2017); Cystoscopy with biopsy (N/A, 10/21/2017); Colonoscopy with propofol (N/A, 02/18/2018); and Cystoscopy with biopsy (N/A, 12/08/2018). Prior to Admission medications   Medication Sig Start Date End Date Taking? Authorizing Provider  aspirin EC 81 MG tablet Take 81 mg by mouth daily.   Yes [provider]  atorvastatin (LIPITOR) 80 MG tablet TAKE 1 TABLET BY MOUTH AT BEDTIME Patient taking differently: Take 80 mg by mouth at bedtime.  07/06/18  Yes [name], MD  carvedilol (COREG) 12.5 MG tablet take 12.5 mg by mouth twice a day with food Patient taking differently: Take 12.5 mg by mouth 2 (two) times daily with a meal.  02/16/18  Yes Gollan, Kathlene November, MD  hydrocortisone (ANUSOL-HC) 2.5 % rectal cream Apply 1 application topically 2 (two) times daily. 09/22/18  Yes [provider]  losartan (COZAAR) 25 MG tablet Take 25 mg by mouth daily.    Yes [provider]  Multiple Vitamins-Minerals (CENTRUM SILVER PO) Take 1 tablet by mouth daily.   Yes [provider]  NOVOLOG 100 UNIT/ML injection See admin instructions. Uses via inuslin pump 03/14/16  Yes [provider]  tamsulosin (FLOMAX) 0.4 MG CAPS capsule  Take 0.4 mg by mouth daily.   Yes [provider]  glucagon (GLUCAGON EMERGENCY) 1 MG injection Inject 1 mg into the skin once as needed (for low blood sugar).     [provider]  Meth-Hyo-M  Bl-Na Phos-Ph Sal (URIBEL) 118 MG CAPS Take 1 capsule (118 mg total) by mouth every 6 (six) hours as needed (dysuria). Patient not taking: Reported on 12/16/2018 12/08/18   Royston Cowper, MD  mupirocin ointment (BACTROBAN) 2 % Apply 1 application topically 3 (three) times daily. 07/30/18   [provider]  naproxen sodium (ANAPROX) 220 MG tablet Take 220 mg by mouth daily as needed (for pain or headache).     [provider]  nitroGLYCERIN (NITROSTAT) 0.4 MG SL tablet Place 0.4 mg under the tongue every 5 (five) minutes as needed for chest pain. Do not exceed 3 doses in 15 minutes.     [provider]   No Known Allergies  FAMILY HISTORY:  family history includes Heart attack in his mother; Heart disease in his father and mother; Prostate cancer in his brother. SOCIAL HISTORY:  reports that he quit smoking about 23 years ago. His smoking use included cigarettes. He has a 10.00 pack-year smoking history. He has never used smokeless tobacco. He reports that he does not drink alcohol or use drugs.  REVIEW OF SYSTEMS:  Positives in BOLD Constitutional: Negative for fever, +chills, weight loss, +malaise/fatigue and diaphoresis.  HENT: Negative for hearing loss, ear pain, nosebleeds, congestion, sore throat, neck pain, tinnitus and ear discharge.   Eyes: Negative for blurred vision, double vision, photophobia, pain, discharge and redness.  Respiratory: Negative for cough, hemoptysis, sputum production, shortness of breath, wheezing and stridor.   Cardiovascular: Negative for chest pain, palpitations, orthopnea, claudication, leg swelling and PND.  Gastrointestinal: Negative for +heartburn, +nausea, vomiting, abdominal pain, diarrhea, constipation, blood in stool and melena.  Genitourinary: Negative for +dysuria, +urgency, +frequency, hematuria and flank pain.  Musculoskeletal: Negative for myalgias, back pain, joint pain and falls.  Skin: Negative for itching and rash.    Neurological: Negative for dizziness, tingling, tremors, sensory change, speech change, focal weakness, seizures, loss of consciousness, weakness and headaches.  Endo/Heme/Allergies: Negative for environmental allergies and polydipsia. Does not bruise/bleed easily.  SUBJECTIVE:  Pt reports indigestion and nausea, urinary frequency, dysuria Denies chest pain, shortness of breath, cough, sputum production, sick contacts Denies abdominal pain, hematemesis, diarrhea Pt wants to drink water  VITAL SIGNS: Temp:  [98.1 F (36.7 C)] 98.1 F (36.7 C) (02/05 1620) Pulse Rate:  [86-96] 96 (02/05 2030) Resp:  [19-23] 19 (02/05 2030) BP: (122-139)/(53-108) 122/55 (02/05 2030) SpO2:  [96 %-100 %] 96 % (02/05 2030) Weight:  [82.6 kg] 82.6 kg (02/05 1621)  PHYSICAL EXAMINATION: General:  Acutely ill appearing male, laying in bed, on room air, in NAD Neuro:  Awake, A&O x4, follows commands, no focal deficits, speech clear HEENT:  Atraumatic, normocephalic, neck supple, no JVD Cardiovascular:  Tachycardia, regular rhythm, no M/R/G, 2+ pulses Lungs:  Clear to auscultation bilaterally, even, nonlabored, normal effort, no assessory muscle use Abdomen:  Soft, nontender, nondistended, no guarding or rebound tenderness, BS hypoactive Musculoskeletal:  No deformities, normal bulk and tone, no edema Skin:  Warm/dry.  No obvious rashes, lesions, or ulcerations  Recent Labs  Lab 12/16/18 1649  NA 137  K 5.4*  CL 99  CO2 18*  BUN 48*  CREATININE 1.53*  GLUCOSE 497*   Recent Labs  Lab 12/16/18 1649  HGB 12.2*  HCT 37.3*  WBC 12.2*  PLT 320   No results found.  ASSESSMENT / PLAN:  DKA Nausea & Vomiting in setting of DKA -Aggressive IVF -Insulin drip -Follow DKA protocol -BMP q4h -Once anion gap and serum CO2 normalize, will convert to SSI per DKA protocol -Consult Diabetes Coordinator, appreciate input -NPO for now; once converts can start clear liquids -Supportive Care -Protonix  IV -Prn Zofran and Phenergan  AKI in setting of dehydration Hyperkalemia Anion Gap Metabolic Acidosis in setting of DKA -Monitor I&O's / urinary output -Follow BMP -Ensure adequate renal perfusion -Avoid nephrotoxic agents as able -Replace electrolytes as indicated -IVF -Hyperkalemia should resolve with IVF, IV insulin, and with resolution of DKA; follow BMP  UTI -Monitor fever curve -Trend WBC's -Follow Urine culture -Continue Rocephin; Received Levaquin x1 dose in ED  Hypertension>>stable Hyperlipidemia -Cardiac monitoring -Maintain MAP >65 -IVF -Hold BP meds for now  Anemia without signs of active bleeding Monitor for S/Sx of bleeding Trend CBC Lovenox for VTE Prophylaxis  Transfuse for Hgb <7   DISPOSITION: Stepdown GOALS OF CARE: Full Code VTE PROPHYLAXIS: Lovenox UPDATES: Updated pt at bedside 12/16/18   Darel Hong, [facility] [place] Pager: [phone] Cell: [phone]  12/16/2018, 9:00 PM

## 2018-12-16 NOTE — ED Notes (Signed)
Lab notified to add on Urine Culture. 

## 2018-12-16 NOTE — Progress Notes (Signed)
Pt admitted to ICU-18 for DKA. Pt hooked up to monitor, VSS. Pt on room air, LR bolus and insulin gtt infusing. Will monitor POCT hourly and on glucose stabilizer for BG control. Pts partner at bedside, has patients belongings.

## 2018-12-16 NOTE — ED Provider Notes (Addendum)
[facility] Emergency Department Provider Note  Time seen: 4:36 PM  I have reviewed the triage vital signs and the nursing notes.   HISTORY  Chief Complaint Hyperglycemia    HPI [name] is a 73 y.o. male with a past medical history of diabetes, hypertension, hyperlipidemia, presents to the emergency department for hyperglycemia.  According to the patient over the past few days he has been intermittent dizzy and lightheaded, with nausea and vomiting this morning.  Found his blood glucose to be elevated in the 400s.  Patient states he has been diabetic for many years, has only been using an insulin pump for 1 month and believes the pump may have malfunctioned.  States he was at his doctor's office this morning when he vomited and nearly passed out so they sent him to the emergency department for evaluation.  Here the patient denies any chest pain or abdominal pain does state continued mild nausea states he feels very dehydrated.   Past Medical History:  Diagnosis Date  . Anemia   . Arthritis    right knee  . Cancer [facility])    BLADDER CANCER (2018)skin cancer  . Coronary artery disease   . Diabetes mellitus   . Edema   . GERD (gastroesophageal reflux disease)    rare-NO MEDS  . Heart attack ([place]) 2007  . Hyperlipidemia   . Hypertension   . Hypoglycemia    history of episodes x2  . Ischemic cardiomyopathy   . Myocardial infarction ([place])   . Peripheral vascular disease ([name])    swelling in Lt ankle due to prior injury    Patient Active Problem List   Diagnosis Date Noted  . Arteriosclerosis of coronary artery 06/08/2015  . BP (high blood pressure) 06/08/2015  . Back pain 02/23/2015  . Diaphoresis 01/02/2012  . Diabetes type 2, controlled ([place]) 12/27/2010  . Hyperlipidemia 12/27/2010  . HYPERTENSION, BENIGN 12/27/2010  . CAD, NATIVE VESSEL 12/27/2010  . EDEMA 12/27/2010    Past Surgical History:  Procedure Laterality Date  . CARDIAC  CATHETERIZATION    . COLONOSCOPY WITH PROPOFOL N/A 02/18/2018   Procedure: COLONOSCOPY WITH PROPOFOL;  Surgeon: Toledo, Benay Pike, MD;  Location: [facility] ENDOSCOPY;  Service: Gastroenterology;  Laterality: N/A;  . CORONARY ANGIOPLASTY    . CORONARY STENT PLACEMENT     To an occluded LAD x2  . CYSTOSCOPY WITH BIOPSY N/A 10/21/2017   Procedure: CYSTOSCOPY WITH BLADDER BIOPSY;  Surgeon: Royston Cowper, MD;  Location: [facility] ORS;  Service: Urology;  Laterality: N/A;  . CYSTOSCOPY WITH BIOPSY N/A 12/08/2018   Procedure: CYSTOSCOPY WITH BLADDER BIOPSY-MITOMYCIN;  Surgeon: Royston Cowper, MD;  Location: [facility] ORS;  Service: Urology;  Laterality: N/A;  . EXCISION MASS LOWER EXTREMETIES Right 06/05/2016   Procedure: EXCISION OF LESION RIGHT LOWER LEG;  Surgeon: Hubbard Robinson, MD;  Location: [facility] ORS;  Service: General;  Laterality: Right;  . SKIN CANCER EXCISION     chest  . TRANSURETHRAL RESECTION OF BLADDER TUMOR N/A 07/15/2017   Procedure: TRANSURETHRAL RESECTION OF BLADDER TUMOR (TURBT);  Surgeon: Royston Cowper, MD;  Location: [facility] ORS;  Service: Urology;  Laterality: N/A;    Prior to Admission medications   Medication Sig Start Date End Date Taking? Authorizing Provider  aspirin EC 81 MG tablet Take 81 mg by mouth daily.    [provider]  atorvastatin (LIPITOR) 80 MG tablet TAKE 1 TABLET BY MOUTH AT BEDTIME Patient taking differently: Take 80 mg by mouth  at bedtime.  07/06/18   [name], MD  carvedilol (COREG) 12.5 MG tablet take 12.5 mg by mouth twice a day with food Patient taking differently: Take 12.5 mg by mouth 2 (two) times daily with a meal.  02/16/18   Gollan, Kathlene November, MD  glucagon (GLUCAGON EMERGENCY) 1 MG injection Inject 1 mg into the skin once as needed (for low blood sugar).     [provider]  losartan (COZAAR) 25 MG tablet Take 25 mg by mouth daily.     [provider]  Meth-Hyo-M Bl-Na Phos-Ph Sal (URIBEL) 118 MG CAPS Take 1 capsule (118  mg total) by mouth every 6 (six) hours as needed (dysuria). 12/08/18   Royston Cowper, MD  Multiple Vitamins-Minerals (CENTRUM SILVER PO) Take 1 tablet by mouth daily.    [provider]  naproxen sodium (ANAPROX) 220 MG tablet Take 220 mg by mouth daily as needed (for pain or headache).     [provider]  nitroGLYCERIN (NITROSTAT) 0.4 MG SL tablet Place 0.4 mg under the tongue every 5 (five) minutes as needed for chest pain. Do not exceed 3 doses in 15 minutes.     [provider]  NOVOLOG 100 UNIT/ML injection See admin instructions. Uses via inuslin pump 03/14/16   [provider]  tamsulosin (FLOMAX) 0.4 MG CAPS capsule Take 0.4 mg by mouth daily.    [provider]    No Known Allergies  Family History  Problem Relation Age of Onset  . Heart disease Mother   . Heart attack Mother   . Heart disease Father   . Prostate cancer Brother     Social History Social History   Tobacco Use  . Smoking status: Former Smoker    Packs/day: 1.00    Years: 10.00    Pack years: 10.00    Types: Cigarettes    Last attempt to quit: 06/08/1995    Years since quitting: 23.5  . Smokeless tobacco: Never Used  Substance Use Topics  . Alcohol use: No    Alcohol/week: 0.0 standard drinks  . Drug use: No    Review of Systems Constitutional: Negative for fever.  Positive for generalized weakness. ENT: Negative for recent illness/congestion Cardiovascular: Negative for chest pain. Respiratory: Negative for shortness of breath. Gastrointestinal: Negative for abdominal pain.  Positive for nausea and vomiting. Musculoskeletal: Negative for musculoskeletal complaints Skin: Negative for skin complaints  Neurological: Negative for headache All other ROS negative  ____________________________________________   PHYSICAL EXAM:  VITAL SIGNS: ED Triage Vitals  Enc Vitals Group     BP 12/16/18 1620 (!) 134/53     Pulse Rate 12/16/18 1620 86     Resp  12/16/18 1620 19     Temp 12/16/18 1620 98.1 F (36.7 C)     Temp Source 12/16/18 1620 Oral     SpO2 12/16/18 1620 100 %     Weight 12/16/18 1621 182 lb (82.6 kg)     Height 12/16/18 1621 5\' 9"  (1.753 m)     Head Circumference --      Peak Flow --      Pain Score 12/16/18 1621 0     Pain Loc --      Pain Edu? --      Excl. in [place]? --    Constitutional: Alert and oriented. Well appearing and in no distress. Eyes: Normal exam ENT   Head: Normocephalic and atraumatic   Mouth/Throat: Dry mucous membranes. Cardiovascular: Normal rate,  regular rhythm Respiratory: Normal respiratory effort without tachypnea nor retractions. Breath sounds are clear Gastrointestinal: Soft and nontender. No distention.   Musculoskeletal: Nontender with normal range of motion in all extremities.  Neurologic:  Normal speech and language. No gross focal neurologic deficits Skin:  Skin is warm, dry and intact.  Psychiatric: Mood and affect are normal.  ____________________________________________    EKG  EKG viewed and interpreted by myself shows a normal sinus rhythm 84 bpm with a narrow QRS, normal axis, normal intervals, no concerning ST changes  ____________________________________________    INITIAL IMPRESSION / ASSESSMENT AND PLAN / ED COURSE  Pertinent labs & imaging results that were available during my care of the patient were reviewed by me and considered in my medical decision making (see chart for details).  Patient presents to the emergency department for elevated blood glucose, nausea vomiting and near syncope this morning.  Differential would include DKA, hyperglycemia, gastroenteritis, gastroparesis, metabolic abnormality.  We will check labs including VBG, begin IV hydration and continue to closely monitor.  Patient agreeable to plan of care.  Insulin pump is been disconnected.  Patient's labs have resulted showing significant anion gap of 20 with an elevated blood glucose  although normal VBG 7.34.  We will continue with IV fluids, start on insulin infusion given the elevated anion gap.  Patient's urinalysis shows too numerous to count red cells, white cells, bacteria are present as well as white blood cell clumps.  Likely consistent with urinary tract infection, patient had a cystoscopy and biopsy performed approximately 1 week ago.  Believes he is on an antibiotic but is not sure which 1.  Patient's urine is green, possibly indicating Pseudomonas infection.  We will cover with IV Rocephin as well as Levaquin.  Patient will be admitted to the hospitalist service.  CRITICAL CARE Performed by: Harvest Dark   Total critical care time: 30 minutes  Critical care time was exclusive of separately billable procedures and treating other patients.  Critical care was necessary to treat or prevent imminent or life-threatening deterioration.  Critical care was time spent personally by me on the following activities: development of treatment plan with patient and/or surrogate as well as nursing, discussions with consultants, evaluation of patient's response to treatment, examination of patient, obtaining history from patient or surrogate, ordering and performing treatments and interventions, ordering and review of laboratory studies, ordering and review of radiographic studies, pulse oximetry and re-evaluation of patient's condition.   ____________________________________________   FINAL CLINICAL IMPRESSION(S) / ED DIAGNOSES  Hyperglycemia Diabetic ketoacidosis Urinary tract infection   Harvest Dark, MD 12/16/18 1757    Harvest Dark, MD 12/16/18 [phone]

## 2018-12-16 NOTE — ED Notes (Signed)
Two unsuccessful PIV attempts per this RN, will have another RN attempt PIV.

## 2018-12-17 LAB — BASIC METABOLIC PANEL
Anion gap: 5 (ref 5–15)
Anion gap: 5 (ref 5–15)
BUN: 47 mg/dL — ABNORMAL HIGH (ref 8–23)
BUN: 45 mg/dL — ABNORMAL HIGH (ref 8–23)
CO2: 25 mmol/L (ref 22–32)
CO2: 26 mmol/L (ref 22–32)
Calcium: 8.3 mg/dL — ABNORMAL LOW (ref 8.9–10.3)
Calcium: 8.4 mg/dL — ABNORMAL LOW (ref 8.9–10.3)
Chloride: 111 mmol/L (ref 98–111)
Chloride: 110 mmol/L (ref 98–111)
Creatinine, Ser: 1.37 mg/dL — ABNORMAL HIGH (ref 0.61–1.24)
Creatinine, Ser: 1.29 mg/dL — ABNORMAL HIGH (ref 0.61–1.24)
GFR calc Af Amer: 60 mL/min — ABNORMAL LOW (ref >60)
GFR calc Af Amer: >60 mL/min (ref >60)
GFR calc non Af Amer: 52 mL/min — ABNORMAL LOW (ref >60)
GFR calc non Af Amer: 55 mL/min — ABNORMAL LOW (ref >60)
Glucose, Bld: 226 mg/dL — ABNORMAL HIGH (ref 70–99)
Glucose, Bld: 81 mg/dL (ref 70–99)
Potassium: 3.8 mmol/L (ref 3.5–5.1)
Potassium: 4.1 mmol/L (ref 3.5–5.1)
Sodium: 141 mmol/L (ref 135–145)
Sodium: 141 mmol/L (ref 135–145)

## 2018-12-17 LAB — GLUCOSE, CAPILLARY
GLUCOSE-CAPILLARY: 224 mg/dL — AB (ref 70–99)
GLUCOSE-CAPILLARY: 111 mg/dL — AB (ref 70–99)
GLUCOSE-CAPILLARY: 152 mg/dL — AB (ref 70–99)
Glucose-Capillary: 132 mg/dL — ABNORMAL HIGH (ref 70–99)
Glucose-Capillary: 133 mg/dL — ABNORMAL HIGH (ref 70–99)
Glucose-Capillary: 139 mg/dL — ABNORMAL HIGH (ref 70–99)
Glucose-Capillary: 163 mg/dL — ABNORMAL HIGH (ref 70–99)
Glucose-Capillary: 179 mg/dL — ABNORMAL HIGH (ref 70–99)
Glucose-Capillary: 189 mg/dL — ABNORMAL HIGH (ref 70–99)
Glucose-Capillary: 215 mg/dL — ABNORMAL HIGH (ref 70–99)
Glucose-Capillary: 241 mg/dL — ABNORMAL HIGH (ref 70–99)
Glucose-Capillary: 57 mg/dL — ABNORMAL LOW (ref 70–99)
Glucose-Capillary: 65 mg/dL — ABNORMAL LOW (ref 70–99)
Glucose-Capillary: 68 mg/dL — ABNORMAL LOW (ref 70–99)
Glucose-Capillary: 74 mg/dL (ref 70–99)
Glucose-Capillary: 91 mg/dL (ref 70–99)

## 2018-12-17 LAB — CBC
HCT: 32.2 % — ABNORMAL LOW (ref 39.0–52.0)
HEMOGLOBIN: 10.5 g/dL — AB (ref 13.0–17.0)
MCH: 30.6 pg (ref 26.0–34.0)
MCHC: 32.6 g/dL (ref 30.0–36.0)
MCV: 93.9 fL (ref 80.0–100.0)
Platelets: 299 10*3/uL (ref 150–400)
RBC: 3.43 MIL/uL — ABNORMAL LOW (ref 4.22–5.81)
RDW: 14.5 % (ref 11.5–15.5)
WBC: 21.6 10*3/uL — ABNORMAL HIGH (ref 4.0–10.5)
nRBC: 0 % (ref 0.0–0.2)

## 2018-12-17 MED ORDER — INSULIN ASPART 100 UNIT/ML ~~LOC~~ SOLN: 0.0000 [IU] | Freq: Every day | SUBCUTANEOUS | Status: DC

## 2018-12-17 MED ORDER — DEXTROSE 50 % IV SOLN
INTRAVENOUS | Status: AC
  Administered 2018-12-17: 50 mL via INTRAVENOUS
  Filled 2018-12-17: qty 50

## 2018-12-17 MED ORDER — INSULIN DETEMIR 100 UNIT/ML ~~LOC~~ SOLN
24.0000 [IU] | Freq: Every day | SUBCUTANEOUS | Status: DC
  Administered 2018-12-17: 24 [IU] via SUBCUTANEOUS
  Filled 2018-12-17 (×2): qty 0.24

## 2018-12-17 MED ORDER — LIDOCAINE VISCOUS HCL 2 % MT SOLN
15.0000 mL | Freq: Once | OROMUCOSAL | Status: AC
  Administered 2018-12-17: 15 mL via ORAL
  Filled 2018-12-17: qty 15

## 2018-12-17 MED ORDER — INSULIN ASPART 100 UNIT/ML ~~LOC~~ SOLN: 0.0000 [IU] | Freq: Three times a day (TID) | SUBCUTANEOUS | Status: DC

## 2018-12-17 MED ORDER — INSULIN ASPART 100 UNIT/ML ~~LOC~~ SOLN: 3.0000 [IU] | Freq: Three times a day (TID) | SUBCUTANEOUS | Status: DC

## 2018-12-17 MED ORDER — CIPROFLOXACIN 500 MG/5ML (10%) PO SUSR
500.0000 mg | Freq: Every day | ORAL | Status: DC
  Administered 2018-12-17 – 2018-12-18 (×3): 500 mg via ORAL
  Filled 2018-12-17 (×2): qty 5

## 2018-12-17 MED ORDER — ATORVASTATIN CALCIUM 20 MG PO TABS
80.0000 mg | ORAL_TABLET | Freq: Every day | ORAL | Status: DC
  Administered 2018-12-17: 80 mg via ORAL
  Filled 2018-12-17: qty 4

## 2018-12-17 MED ORDER — INSULIN ASPART 100 UNIT/ML ~~LOC~~ SOLN
0.0000 [IU] | SUBCUTANEOUS | Status: DC
  Administered 2018-12-17: 1 [IU] via SUBCUTANEOUS
  Administered 2018-12-18 (×2): 4 [IU] via SUBCUTANEOUS
  Administered 2018-12-18: 01:00:00 2 [IU] via SUBCUTANEOUS
  Filled 2018-12-17 (×4): qty 1

## 2018-12-17 MED ORDER — DEXTROSE 50 % IV SOLN
1.0000 | Freq: Once | INTRAVENOUS | Status: AC
  Administered 2018-12-17: 50 mL via INTRAVENOUS

## 2018-12-17 MED ORDER — ALUM & MAG HYDROXIDE-SIMETH 200-200-20 MG/5ML PO SUSP
30.0000 mL | Freq: Once | ORAL | Status: AC
  Administered 2018-12-17: 30 mL via ORAL
  Filled 2018-12-17: qty 30

## 2018-12-17 MED ORDER — MENTHOL 3 MG MT LOZG
1.0000 | LOZENGE | OROMUCOSAL | Status: DC | PRN
  Administered 2018-12-17 – 2018-12-18 (×3): 3 mg via ORAL
  Filled 2018-12-17: qty 9

## 2018-12-17 MED ORDER — DEXTROSE-NACL 5-0.9 % IV SOLN
INTRAVENOUS | Status: DC
  Administered 2018-12-17: 16:00:00 via INTRAVENOUS

## 2018-12-17 NOTE — Progress Notes (Signed)
CRITICAL VALUE ALERT  Critical Value: BS 68, 65, 57, 133  Date & Time Notied: 12/17/18  Provider Notified: Md Aleskerov   Orders Received/Actions taken: 4oz of juice, graham crackers and peanut butter. 1 amp of D50 ordered.

## 2018-12-17 NOTE — Progress Notes (Signed)
[place] [facility] [place] NAME: [name]    MR#:  [id number]  DATE OF BIRTH:  07/17/1946  SUBJECTIVE:  CHIEF COMPLAINT:   Chief Complaint  Patient presents with  . Hyperglycemia  no complaints, sugars improving and may be dropping now REVIEW OF SYSTEMS:  Review of Systems  Constitutional: Negative for diaphoresis, fever, malaise/fatigue and weight loss.  HENT: Negative for ear discharge, ear pain, hearing loss, nosebleeds, sore throat and tinnitus.   Eyes: Negative for blurred vision and pain.  Respiratory: Negative for cough, hemoptysis, shortness of breath and wheezing.   Cardiovascular: Negative for chest pain, palpitations, orthopnea and leg swelling.  Gastrointestinal: Negative for abdominal pain, blood in stool, constipation, diarrhea, heartburn, nausea and vomiting.  Genitourinary: Negative for dysuria, frequency and urgency.  Musculoskeletal: Negative for back pain and myalgias.  Skin: Negative for itching and rash.  Neurological: Negative for dizziness, tingling, tremors, focal weakness, seizures, weakness and headaches.  Psychiatric/Behavioral: Negative for depression. The patient is not nervous/anxious.     DRUG ALLERGIES:  No Known Allergies VITALS:  Blood pressure (!) 112/58, pulse 79, temperature 97.9 F (36.6 C), temperature source Oral, resp. rate 20, height 5\' 9"  (1.753 m), weight 80 kg, SpO2 92 %. PHYSICAL EXAMINATION:  Physical Exam HENT:     Head: Normocephalic and atraumatic.  Eyes:     Conjunctiva/sclera: Conjunctivae normal.     Pupils: Pupils are equal, round, and reactive to light.  Neck:     Musculoskeletal: Normal range of motion and neck supple.     Thyroid: No thyromegaly.     Trachea: No tracheal deviation.  Cardiovascular:     Rate and Rhythm: Normal rate and regular rhythm.     Heart sounds: Normal heart sounds.  Pulmonary:     Effort: Pulmonary effort is normal. No respiratory distress.       Breath sounds: Normal breath sounds. No wheezing.  Chest:     Chest wall: No tenderness.  Abdominal:     General: Bowel sounds are normal. There is no distension.     Palpations: Abdomen is soft.     Tenderness: There is no abdominal tenderness.  Musculoskeletal: Normal range of motion.  Skin:    General: Skin is warm and dry.     Findings: No rash.  Neurological:     Mental Status: He is alert and oriented to person, place, and time.     Cranial Nerves: No cranial nerve deficit.    LABORATORY PANEL:  Male CBC Recent Labs  Lab 12/17/18 0906  WBC 21.6*  HGB 10.5*  HCT 32.2*  PLT 299   ------------------------------------------------------------------------------------------------------------------ Chemistries  Recent Labs  Lab 12/16/18 1649  12/17/18 0906  NA 137   < > 141  K 5.4*   < > 4.1  CL 99   < > 110  CO2 18*   < > 26  GLUCOSE 497*   < > 81  BUN 48*   < > 45*  CREATININE 1.53*   < > 1.29*  CALCIUM 9.3   < > 8.4*  AST 28  --   --   ALT 26  --   --   ALKPHOS 81  --   --   BILITOT 2.3*  --   --    < > = values in this interval not displayed.   RADIOLOGY:  No results found. ASSESSMENT AND PLAN:   #DKA Resolved while on DKA protocol in ICU/Stepdown -  sugars are dropping now, start D5ns @ 50 and check sugars Q 4 hrs - DM nurse following  #Hyperkalemia resolved  #Acute cystitis with nausea and vomiting - improving but leukocytosis worsened, monitor Urine culture and sensitivity pending On IV Cipro - can consider switching to Rocephin if persistent leukocytosis  #Acute kidney injury secondary to dehydration from DKA Improving with hydration, prerenal etio  #Hyperlipidemia Resume lipitor  #Essential hypertension Blood pressure is stable continue hydration with IV fluids and resume BP meds as need     All the records are reviewed and case discussed with Care Management/Social Worker. Management plans discussed with the patient, nursing  and they are in agreement.  CODE STATUS: Full Code  TOTAL TIME TAKING CARE OF THIS PATIENT: 35 minutes.   More than 50% of the time was spent in counseling/coordination of care: YES  POSSIBLE D/C IN 1-2 DAYS, DEPENDING ON CLINICAL CONDITION.   Max Sane M.D on 12/17/2018 at 3:45 PM  Between 7am to 6pm - Pager - [phone]  After 6pm go to [email or web address] - Proofreader  [facility]  Office  [phone]  CC: Primary care physician; [name], MD  Note: This dictation was prepared with Dragon dictation along with smaller phrase technology. Any transcriptional errors that result from this process are unintentional.

## 2018-12-17 NOTE — Progress Notes (Signed)
Notified Dr. Gabriel Carina of patient's admission to the hospital.

## 2018-12-17 NOTE — Progress Notes (Signed)
CRITICAL CARE NOTE  CC   Metabolic acidosis, DKA, UTI, hyperkalemia, nausea, vomiting  SUBJECTIVE Patient significantly improved, complains of dysphagia and hoarseness of voice secondary to reflux esophagitis     SIGNIFICANT EVENTS DKA resolved, mentation improved.   BP (!) 100/56   Pulse 70   Temp 99.3 F (37.4 C) (Oral)   Resp 18   Ht 5\' 9"  (1.753 m)   Wt 80 kg   SpO2 95%   BMI 26.05 kg/m    REVIEW OF SYSTEMS  System ROS reviewed and is negative except voice hoarseness ,reflux esophagitis  PHYSICAL EXAMINATION:  GENERAL: No apparent distress alert and oriented x3 HEAD: Normocephalic, atraumatic.  EYES: Pupils equal, round, reactive to light.  No scleral icterus.  MOUTH: Moist mucosal membrane. NECK: Supple. No thyromegaly. No nodules. No JVD.  PULMONARY: To auscultation bilaterally CARDIOVASCULAR: S1 and S2. Regular rate and rhythm. No murmurs, rubs, or gallops.  GASTROINTESTINAL: Soft, nontender, -distended. No masses. Positive bowel sounds. No hepatosplenomegaly.  MUSCULOSKELETAL: No swelling, clubbing, or edema.  NEUROLOGIC: Mild distress due to acute illness SKIN:intact,warm,dry   Labs and Imaging:     -I personally reviewed most recent blood work, imaging and microbiology - significant findings today are ,blood sugars normalized, improved AKI, improved mental status    ASSESSMENT AND PLAN SYNOPSIS   DKA -Off insulin drip, started weight-based regimen with Levemir and ISS for now -Please consult Dr. Gabriel Carina endocrinology, interrogation of insulin pump, DKA, DM    Renal Failure-most likely due to ATN -Improved -Continue gentle hydration IV -follow chem 7 -follow UO     ID -Urinary tract infection as evidenced by dysuria urine culture pending -Switching IV Rocephin to Cipro oral solution due to dysphagia with hoarseness pending sensitivity panel from urine culture -follow up cultures  GI/Nutrition GI PROPHYLAXIS as indicated DIET-->TF's  as tolerated Constipation protocol as indicated  ENDO -as above recommend endocrine consult-Dr. Gabriel Carina -established patient - ICU hypoglycemic\Hyperglycemia protocol -check FSBS per protocol   ELECTROLYTES -follow labs as needed -replace as needed -pharmacy consultation   DVT/GI PRX ordered -SCDs  TRANSFUSIONS AS NEEDED MONITOR FSBS ASSESS the need for LABS as needed   Critical care provider statement:    Critical care time (minutes):  32   Critical care time was exclusive of:  Separately billable procedures and  treating other patients   Critical care was necessary to treat or prevent imminent or  life-threatening deterioration of the following conditions:   DKA, acute renal injury, presyncope nausea and vomiting, hyperkalemia, leukocytosis secondary to UTI   Critical care was time spent personally by me on the following  activities:  Development of treatment plan with patient or surrogate,  discussions with consultants, evaluation of patient's response to  treatment, examination of patient, obtaining history from patient or  surrogate, ordering and performing treatments and interventions, ordering  and review of laboratory studies and re-evaluation of patient's condition   I assumed direction of critical care for this patient from another  provider in my specialty: no     Ottie Glazier, M.D.  [facility] [place]

## 2018-12-17 NOTE — Progress Notes (Addendum)
Inpatient Diabetes Program Recommendations  AACE/ADA: New Consensus Statement on Inpatient Glycemic Control (2015)  Target Ranges:  Prepandial:   less than 140 mg/dL      Peak postprandial:   less than 180 mg/dL (1-2 hours)      Critically ill patients:  140 - 180 mg/dL    Review of Glycemic Control  Diabetes history: Type 1 DM -Last A1C=7.5%  Outpatient Diabetes medications:  Tandem insulin pump: settings Total basal=26.45 Basal Correction Carb ratio Target BG 12 AM 1.1 unit/hr  4:30 AM 0.85 unit/hr 7:30 AM 1.1 unit/hr  4 PM 1.2 unit/hr  CHO ratio: 1 unit for every 15 grams of CHO Correction factor: 1 unit drops blood sugar 40 mg/dL with a goal of 120 mg/dL  Current orders for Inpatient glycemic control:  Transitioned off insulin drip this AM Levemir 24 units daily, Novolog moderate tid with meals and HS  Inpatient Diabetes Program Recommendations:    Will see patient this morning. While in the hospital, please reduce Novolog correction to sensitive tid with meals and add Novolog meal coverage 3 units tid with meals.   Thanks Adah Perl, RN, BC-ADM Inpatient Diabetes Coordinator Pager [phone] (8a-5p)   [phone]- visited with patient.  He does not have insulin pump with him at this time.  Insulin pump site is still in place. Patient thinks that something was wrong with pump as he has only had for 1 month.  He states that he did not have anymore infusion sets and was unable to change as Dr. Gabriel Carina had instructed.  Removed infusion site and cannula was kinked which explains why insulin was not infusing and the potential cause of DKA.  Discussed with MD and RN. Orders received.  Instructed patient to have family bring insulin pump and supplies so that insulin pump may be restarted in the morning.  Patient states he has gotten more supplies so he is agreeable to this plan.  For today he will receive basal/bolus insulin at hospital.  Patient verbalized understanding.  Orders  received from MD.  Will alert Dr. Gabriel Carina regarding patient's admission.

## 2018-12-17 NOTE — Progress Notes (Signed)
Per Dr Brigitte Pulse ok to start D5 NS at 54 ml.hr but may have to stop if sugar gets over 200, he said to also contact diabetes educator

## 2018-12-17 NOTE — Progress Notes (Signed)
Spoke with RN regarding patient's hypoglycemia symptoms. CBG=137 mg/dL  Agree with addition of D5NS.  Called and discussed with MD and order received for change of Novolog correction to q 4 hours.  Also moved Levemir to 10 am on 12/18/18 so DM coordinator can reassess insulin needs in the AM.  Patient is having a difficult time eating due to his throat hurting.  Thanks,  Adah Perl, RN, BC-ADM Inpatient Diabetes Coordinator Pager [phone] (8a-5p)

## 2018-12-17 NOTE — Progress Notes (Signed)
Spoke with diabetes educator she is ok about starting D5 NS at 86 for now, will send MD a message about possibly changing sugar checks to Q4

## 2018-12-18 LAB — BASIC METABOLIC PANEL
ANION GAP: 9 (ref 5–15)
BUN: 41 mg/dL — ABNORMAL HIGH (ref 8–23)
CO2: 21 mmol/L — ABNORMAL LOW (ref 22–32)
Calcium: 8.1 mg/dL — ABNORMAL LOW (ref 8.9–10.3)
Chloride: 109 mmol/L (ref 98–111)
Creatinine, Ser: 1.17 mg/dL (ref 0.61–1.24)
GFR calc Af Amer: >60 mL/min (ref >60)
GFR calc non Af Amer: >60 mL/min (ref >60)
Glucose, Bld: 343 mg/dL — ABNORMAL HIGH (ref 70–99)
Potassium: 4.4 mmol/L (ref 3.5–5.1)
Sodium: 139 mmol/L (ref 135–145)

## 2018-12-18 LAB — CBC
HCT: 32.1 % — ABNORMAL LOW (ref 39.0–52.0)
Hemoglobin: 10.4 g/dL — ABNORMAL LOW (ref 13.0–17.0)
MCH: 30.6 pg (ref 26.0–34.0)
MCHC: 32.4 g/dL (ref 30.0–36.0)
MCV: 94.4 fL (ref 80.0–100.0)
Platelets: 228 10*3/uL (ref 150–400)
RBC: 3.4 MIL/uL — ABNORMAL LOW (ref 4.22–5.81)
RDW: 14.7 % (ref 11.5–15.5)
WBC: 14.1 10*3/uL — ABNORMAL HIGH (ref 4.0–10.5)
nRBC: 0 % (ref 0.0–0.2)

## 2018-12-18 LAB — GLUCOSE, CAPILLARY
GLUCOSE-CAPILLARY: 312 mg/dL — AB (ref 70–99)
GLUCOSE-CAPILLARY: 350 mg/dL — AB (ref 70–99)
GLUCOSE-CAPILLARY: 356 mg/dL — AB (ref 70–99)
Glucose-Capillary: 250 mg/dL — ABNORMAL HIGH (ref 70–99)
Glucose-Capillary: 321 mg/dL — ABNORMAL HIGH (ref 70–99)
Glucose-Capillary: 274 mg/dL — ABNORMAL HIGH (ref 70–99)

## 2018-12-18 MED ORDER — CIPROFLOXACIN HCL 500 MG PO TABS: 500.0000 mg | ORAL_TABLET | Freq: Two times a day (BID) | ORAL | 0 refills | Status: DC

## 2018-12-18 MED ORDER — INSULIN ASPART 100 UNIT/ML ~~LOC~~ SOLN
0.0000 [IU] | SUBCUTANEOUS | Status: DC
  Administered 2018-12-18: 14:00:00 4 [IU] via SUBCUTANEOUS
  Filled 2018-12-18: qty 1

## 2018-12-18 MED ORDER — INSULIN ASPART 100 UNIT/ML ~~LOC~~ SOLN
4.0000 [IU] | Freq: Three times a day (TID) | SUBCUTANEOUS | Status: DC
  Administered 2018-12-18: 4 [IU] via SUBCUTANEOUS
  Filled 2018-12-18: qty 1

## 2018-12-18 MED ORDER — INSULIN ASPART 100 UNIT/ML ~~LOC~~ SOLN
0.0000 [IU] | SUBCUTANEOUS | Status: DC
  Administered 2018-12-18: 09:00:00 4 [IU] via SUBCUTANEOUS

## 2018-12-18 MED ORDER — POLYETHYLENE GLYCOL 3350 17 G PO PACK
17.0000 g | PACK | Freq: Every day | ORAL | Status: DC
  Administered 2018-12-18: 06:00:00 17 g via ORAL
  Filled 2018-12-18 (×2): qty 1

## 2018-12-18 MED ORDER — INSULIN PUMP
Freq: Three times a day (TID) | SUBCUTANEOUS | Status: DC
  Administered 2018-12-18: 17:00:00 5.48 via SUBCUTANEOUS
  Filled 2018-12-18: qty 1

## 2018-12-18 MED ORDER — INSULIN ASPART 100 UNIT/ML ~~LOC~~ SOLN: 4.0000 [IU] | Freq: Three times a day (TID) | SUBCUTANEOUS | Status: DC

## 2018-12-18 NOTE — Progress Notes (Signed)
Pt on D5 IV. Sugars checked Q4 and increased to 312. Insulin given. MD notified, no new orders given.

## 2018-12-18 NOTE — Discharge Summary (Addendum)
[facility] NAME: [name]    MR#:  [id number]  DATE OF BIRTH:  1946-03-13  DATE OF ADMISSION:  12/16/2018   ADMITTING PHYSICIAN: Nicholes Mango, MD  DATE OF DISCHARGE: 12/18/18  PRIMARY CARE PHYSICIAN: [name], MD   ADMISSION DIAGNOSIS:  Urinary tract infection without hematuria, site unspecified [N39.0] Diabetic ketoacidosis without coma associated with type 1 diabetes mellitus ([place]) [E10.10] DISCHARGE DIAGNOSIS:  Active Problems:   DKA (diabetic ketoacidoses) ([place])  SECONDARY DIAGNOSIS:   Past Medical History:  Diagnosis Date  . Anemia   . Arthritis    right knee  . Cancer [facility])    BLADDER CANCER (2018)skin cancer  . Coronary artery disease   . Diabetes mellitus   . Edema   . GERD (gastroesophageal reflux disease)    rare-NO MEDS  . Heart attack ([place]) 2007  . Hyperlipidemia   . Hypertension   . Hypoglycemia    history of episodes x2  . Ischemic cardiomyopathy   . Myocardial infarction ([place])   . Peripheral vascular disease ([name])    swelling in Lt ankle due to prior injury   HOSPITAL COURSE:   [name] is a 73 year old male who presented to the ED with high blood sugars, nausea, and vomiting.  He has been using his insulin pump for 1 month, but felt that the pump was malfunctioning.  He went to his endocrinologist office, but was sent to the ED for further evaluation.  In the ED, he was found to be in DKA.  Insulin drip was started.  He was admitted for further management.  DKA in type 1 diabetes- likely due to malfunctioning insulin pump -DKA resolved on insulin drip -Patient transitioned back to subcutaneous insulin -New insulin pump and supplies were brought to the hospital on the day of discharge and insulin pump was restarted on discharge -Patient should follow-up with his endocrinologist as an outpatient  Acute cystitis- improved  -Leukocytosis improved -Treated with IV ciprofloxacin and  then transitioned to p.o. Cipro on discharge -Urine culture was pending at the time of discharge  Acute kidney injury secondary to dehydration from DKA -Resolved with IV fluids -Losartan held on discharge and may be restarted as an outpatient  Hyperlipidemia -Continued Lipitor  Essential hypertension- BPs soft -Continued Coreg -Losartan held on discharge and can be restarted as needed as an outpatient  DISCHARGE CONDITIONS:  Type 2 diabetes Acute cystitis Hyperlipidemia Hypertension CONSULTS OBTAINED:  CCM DRUG ALLERGIES:  No Known Allergies DISCHARGE MEDICATIONS:   Allergies as of 12/18/2018   No Known Allergies     Medication List    STOP taking these medications   losartan 25 MG tablet Commonly known as:  COZAAR   naproxen sodium 220 MG tablet Commonly known as:  ALEVE   nitroGLYCERIN 0.4 MG SL tablet Commonly known as:  NITROSTAT     TAKE these medications   aspirin EC 81 MG tablet Take 81 mg by mouth daily.   atorvastatin 80 MG tablet Commonly known as:  LIPITOR TAKE 1 TABLET BY MOUTH AT BEDTIME   carvedilol 12.5 MG tablet Commonly known as:  COREG take 12.5 mg by mouth twice a day with food What changed:    how much to take  how to take this  when to take this  additional instructions   CENTRUM SILVER PO Take 1 tablet by mouth daily.   ciprofloxacin 500 MG tablet Commonly known as:  CIPRO Take 1 tablet (500  mg total) by mouth 2 (two) times daily.   GLUCAGON EMERGENCY 1 MG injection Generic drug:  glucagon Inject 1 mg into the skin once as needed (for low blood sugar).   hydrocortisone 2.5 % rectal cream Commonly known as:  ANUSOL-HC Apply 1 application topically 2 (two) times daily.   mupirocin ointment 2 % Commonly known as:  BACTROBAN Apply 1 application topically 3 (three) times daily.   NOVOLOG 100 UNIT/ML injection Generic drug:  insulin aspart See admin instructions. Uses via inuslin pump   tamsulosin 0.4 MG Caps  capsule Commonly known as:  FLOMAX Take 0.4 mg by mouth daily.   URIBEL 118 MG Caps Take 1 capsule (118 mg total) by mouth every 6 (six) hours as needed (dysuria).        DISCHARGE INSTRUCTIONS:  1.  Follow-up with PCP in 5 days 2.  Follow-up with endocrinology in 1 week 3.  Hold losartan for now and can restart as outpatient as needed 4.  Take ciprofloxacin twice daily for a total 7-day course DIET:  Cardiac diet and Diabetic diet DISCHARGE CONDITION:  Stable ACTIVITY:  Activity as tolerated OXYGEN:  Home Oxygen: No.  Oxygen Delivery: room air DISCHARGE LOCATION:  home   If you experience worsening of your admission symptoms, develop shortness of breath, life threatening emergency, suicidal or homicidal thoughts you must seek medical attention immediately by calling 911 or calling your MD immediately  if symptoms less severe.  You Must read complete instructions/literature along with all the possible adverse reactions/side effects for all the Medicines you take and that have been prescribed to you. Take any new Medicines after you have completely understood and accpet all the possible adverse reactions/side effects.   Please note  You were cared for by a hospitalist during your hospital stay. If you have any questions about your discharge medications or the care you received while you were in the hospital after you are discharged, you can call the unit and asked to speak with the hospitalist on call if the hospitalist that took care of you is not available. Once you are discharged, your primary care physician will handle any further medical issues. Please note that NO REFILLS for any discharge medications will be authorized once you are discharged, as it is imperative that you return to your primary care physician (or establish a relationship with a primary care physician if you do not have one) for your aftercare needs so that they can reassess your need for medications and  monitor your lab values.    On the day of Discharge:  VITAL SIGNS:  Blood pressure (!) 127/55, pulse 83, temperature 97.9 F (36.6 C), temperature source Oral, resp. rate 20, height 5\' 9"  (1.753 m), weight 80 kg, SpO2 92 %. PHYSICAL EXAMINATION:  GENERAL:  73 y.o.-year-old patient lying in the bed with no acute distress.  EYES: Pupils equal, round, reactive to light and accommodation. No scleral icterus. Extraocular muscles intact.  HEENT: Head atraumatic, normocephalic. Oropharynx and nasopharynx clear.  NECK:  Supple, no jugular venous distention. No thyroid enlargement, no tenderness.  LUNGS: Normal breath sounds bilaterally, no wheezing, rales,rhonchi or crepitation. No use of accessory muscles of respiration.  CARDIOVASCULAR: S1, S2 normal. No murmurs, rubs, or gallops.  ABDOMEN: Soft, non-tender, non-distended. Bowel sounds present. No organomegaly or mass.  EXTREMITIES: No pedal edema, cyanosis, or clubbing.  NEUROLOGIC: Cranial nerves II through XII are intact. Muscle strength 5/5 in all extremities. Sensation intact. Gait not checked.  PSYCHIATRIC: The patient  is alert and oriented x 3.  SKIN: No obvious rash, lesion, or ulcer.  DATA REVIEW:   CBC Recent Labs  Lab 12/18/18 0538  WBC 14.1*  HGB 10.4*  HCT 32.1*  PLT 228    Chemistries  Recent Labs  Lab 12/16/18 1649  12/18/18 0538  NA 137   < > 139  K 5.4*   < > 4.4  CL 99   < > 109  CO2 18*   < > 21*  GLUCOSE 497*   < > 343*  BUN 48*   < > 41*  CREATININE 1.53*   < > 1.17  CALCIUM 9.3   < > 8.1*  AST 28  --   --   ALT 26  --   --   ALKPHOS 81  --   --   BILITOT 2.3*  --   --    < > = values in this interval not displayed.     Microbiology Results  Results for orders placed or performed during the hospital encounter of 12/16/18  Urine Culture     Status: Abnormal (Preliminary result)   Collection Time: 12/16/18  4:49 PM  Result Value Ref Range Status   Specimen Description   Final    URINE,  RANDOM Performed at [facility], [address] [place], [place] [address]    Special Requests   Final    NONE Performed at [facility], [address], [place], [place] [address]    Culture (A)  Final    >=100,000 COLONIES/mL UNIDENTIFIED ORGANISM Performed at [facility], [place] [address] [place], [place] [address]    Report Status PENDING  Incomplete  MRSA PCR Screening     Status: None   Collection Time: 12/16/18  9:08 PM  Result Value Ref Range Status   MRSA by PCR NEGATIVE NEGATIVE Final    Comment:        The GeneXpert MRSA Assay (FDA approved for NASAL specimens only), is one component of a comprehensive MRSA colonization surveillance program. It is not intended to diagnose MRSA infection nor to guide or monitor treatment for MRSA infections. Performed at [facility], [address], [place],  [address]     RADIOLOGY:  No results found.   Management plans discussed with the patient, family and they are in agreement.  CODE STATUS: Full Code   TOTAL TIME TAKING CARE OF THIS PATIENT: 40 minutes.    Berna Spare Mayo M.D on 12/18/2018 at 9:02 AM  Between 7am to 6pm - Pager - [phone]  After 6pm go to [email or web address] - Proofreader  [facility]  Office  [phone]  CC: Primary care physician; [name], MD   Note: This dictation was prepared with Dragon dictation along with smaller phrase technology. Any transcriptional errors that result from this process are unintentional.

## 2018-12-18 NOTE — Plan of Care (Signed)
  Problem: Education: Goal: Ability to describe self-care measures that may prevent or decrease complications (Diabetes Survival Skills Education) will improve Outcome: Progressing Goal: Individualized Educational Video(s) Outcome: Progressing   Problem: Cardiac: Goal: Ability to maintain an adequate cardiac output will improve Outcome: Progressing   Problem: Health Behavior/Discharge Planning: Goal: Ability to identify and utilize available resources and services will improve Outcome: Progressing Goal: Ability to manage health-related needs will improve Outcome: Progressing   Problem: Fluid Volume: Goal: Ability to achieve a balanced intake and output will improve Outcome: Progressing   Problem: Metabolic: Goal: Ability to maintain appropriate glucose levels will improve Outcome: Progressing   Problem: Nutritional: Goal: Maintenance of adequate nutrition will improve Outcome: Progressing Goal: Maintenance of adequate weight for body size and type will improve Outcome: Progressing   Problem: Respiratory: Goal: Will regain and/or maintain adequate ventilation Outcome: Progressing   Problem: Urinary Elimination: Goal: Ability to achieve and maintain adequate renal perfusion and functioning will improve Outcome: Progressing   Problem: Education: Goal: Knowledge of General Education information will improve Description: Including pain rating scale, medication(s)/side effects and non-pharmacologic comfort measures Outcome: Progressing   Problem: Health Behavior/Discharge Planning: Goal: Ability to manage health-related needs will improve Outcome: Progressing   Problem: Clinical Measurements: Goal: Ability to maintain clinical measurements within normal limits will improve Outcome: Progressing Goal: Will remain free from infection Outcome: Progressing Goal: Diagnostic test results will improve Outcome: Progressing Goal: Respiratory complications will improve Outcome:  Progressing Goal: Cardiovascular complication will be avoided Outcome: Progressing   Problem: Activity: Goal: Risk for activity intolerance will decrease Outcome: Progressing   Problem: Nutrition: Goal: Adequate nutrition will be maintained Outcome: Progressing   Problem: Coping: Goal: Level of anxiety will decrease Outcome: Progressing   Problem: Elimination: Goal: Will not experience complications related to bowel motility Outcome: Progressing Goal: Will not experience complications related to urinary retention Outcome: Progressing   Problem: Pain Managment: Goal: General experience of comfort will improve Outcome: Progressing   Problem: Safety: Goal: Ability to remain free from injury will improve Outcome: Progressing   Problem: Skin Integrity: Goal: Risk for impaired skin integrity will decrease Outcome: Progressing   

## 2018-12-18 NOTE — Discharge Instructions (Signed)
It was so nice to meet you during this hospitalization!  You came into the hospital with DKA. We gave you IV insulin and your blood sugar came down. We also got you set up with a new insulin pump.  You had some injury to your kidneys- please do not take your losartan or naproxen for right now. Your primary care doctor may restart this as an outpatient.  You also had a urinary tract infection. We treated you with ciprofloxacin. Please take 1 tablet tonight and then take 1 tablet twice a day for the next 4 days.  Take care, Dr. Brett Albino

## 2018-12-18 NOTE — Plan of Care (Signed)
Pt is hooked back up to insulin pump.  He's leaving with a blood sugar of 350.  He is going to call Dr. Gabriel Carina when he gets home and believes he's going to check his blood sugar and give himself insulin shots instead of use the pump.  Concerned it's not working.  Reviewed d/c instructions with him.

## 2018-12-18 NOTE — Progress Notes (Signed)
Inpatient Diabetes Program Recommendations  AACE/ADA: New Consensus Statement on Inpatient Glycemic Control (2015)  Target Ranges:  Prepandial:   less than 140 mg/dL      Peak postprandial:   less than 180 mg/dL (1-2 hours)      Critically ill patients:  140 - 180 mg/dL   Page received from [place] regarding patient. He has his insulin pump on now and it is working. She stated patient has an appt to follow up with Dr. Gabriel Carina on Monday regarding his pump. Advised RN to check one more CBG prior to patient's discharge to make sure pump is working. She stated she would and had no further questions.   Jonna Clark RN, MSN Diabetes Coordinator Inpatient Glycemic Control Team Team Pager: [phone] (8am-5pm)

## 2018-12-18 NOTE — Plan of Care (Signed)
Pt is d/ced home.  Pt is hooked back up to his insulin pump.  While in patient, he's been on sliding scale and Levemir.  Levimir was d/ced today and until pump was reconnected, he was on meal coverage as well.  Pt is A&O and ambulatory.  Pt is having poor appetite which is concerning for stablizing blood sugars.  Pt's son is at bedside and will transport him on.  No new meds started.

## 2018-12-18 NOTE — Progress Notes (Addendum)
Inpatient Diabetes Program Recommendations  AACE/ADA: New Consensus Statement on Inpatient Glycemic Control  Target Ranges:  Prepandial:   less than 140 mg/dL      Peak postprandial:   less than 180 mg/dL (1-2 hours)      Critically ill patients:  140 - 180 mg/dL  Results for [name], [name] (MRN [id number]) as of 12/18/2018 08:21  Ref. Range 12/17/2018 07:47 12/17/2018 12:01 12/17/2018 12:19 12/17/2018 12:45 12/17/2018 13:10 12/17/2018 15:27 12/17/2018 16:38 12/17/2018 20:34 12/18/2018 01:07 12/18/2018 05:11 12/18/2018 07:42  Glucose-Capillary Latest Ref Range: 70 - 99 mg/dL 74 68 (L) 65 (L) 57 (L) 133 (H) 139 (H) 152 (H) 179 (H) 250 (H) 312 (H) 321 (H)  Results for [name], [name] (MRN [id number]) as of 12/18/2018 08:21  Ref. Range 12/17/2018 03:30 12/17/2018 04:34 12/17/2018 05:28 12/17/2018 06:28  Glucose-Capillary Latest Ref Range: 70 - 99 mg/dL 163 (H) 132 (H) 111 (H) 91   Review of Glycemic Control  Diabetes history: DM1 (makes NO insulin; requires basal, correction, and meal coverage insulin) Outpatient Diabetes medications: Tandem insulin pump with Novolog Basal Rates 12 AM 1.1 unit/hr  4:30 AM 0.85 unit/hr 7:30 AM 1.1 unit/hr  4 PM 1.2 unit/hr  Total 24H basal: 26.45 units  CHO ratio: 1 unit for every 15 grams of CHO Correction factor: 1 unit drops blood sugar 40 mg/dL with a goal of 120 mg  Current orders for Inpatient glycemic control: Levemir 24 units daily, Novolog 0-5 units Q4H  Inpatient Diabetes Program Recommendations:  Correction (SSI): Please increase Novolog correction to 0-9 units Q4H.  Insulin - Meal Coverage: Please consdier ordering Novolog 4 units TID with meals for meal coverage if patient eats at least 50% of meals.  Insulin-Basal: Please discontinue Levemir since patient plans to restart his insulin pump today around noon.  NOTE: Communicated with Stanton Kidney, RN this morning to ask about plan for patient. Patient may be discharged home today and patient would like to restart his  insulin pump. Patient does not have extra insulin pump supplies with him currently at the hospital but states that he should have everything he needs by noon today. Patient is ordered Levemir 24 units daily (to be given at 10am). Would recommend patient NOT receive Levemir if he will be restarting his insulin pump today since his insulin pump will be delivering an hourly basal rate once it is restarted. Recommend ordering Novolog 4 units TID with meals for meal coverage and increasing Novolog correction to 0-9 units Q4H while patient does not have on his insulin pump. Once insulin pump is restarted, will need to order Insulin Pump order set (while inpatient) and discontinue all other SQ insulin orders. Will follow up on patient around noon.  Addendum 12/18/18@12 :08-Spoke with patient and he reports that he now has everything he needs to get his insulin pump restarted. Sent secure chat to Dr. Brett Albino and given orders for patient to resume his insulin pump, order the insulin pump order set and discontinue all other SQ insulin orders.  Spoke with patient again and informed patient that MD has given permission for him to restart his insulin pump. Patient reports that he will go ahead and restart his insulin pump.  Informed patient I would also let Stanton Kidney, RN know that he is going to restart his insulin pump now. Patient verbalized understanding of information discussed. Patient asked if nursing staff could be sure that the doctor knows that when he drinks something hot or drinks orange juice, it is causing pain which  is lasting for 15-20 mins. Patient states it may be due to "rawness in throat". Explained that if his throat his sore and raw, hot liquids and acidic liquids could cause it to be more painful. Suggested patient stay away from liquids that are causing pain and informed patient I would let Stanton Kidney, RN know so that she can discuss with MD.   Addendum 12/18/18@13 :45-Spoke with Stanton Kidney, RN. Pt does not have insulin pump on  yet because the pump battery is dead and he needs the charger for the pump and has family bringing it to him. Patient did not get any Novolog correction or meal coverage for lunch since the order was discontinued with the intention that patient would restart pump and give himself insulin with his insulin pump for correction and lunch meal coverage. However, patient has not resumed insulin pump yet. Re-entered Novolog correction and meal coverage order as previously ordered so RN can give Novolog correction and meal coverage now. Asked that Stanton Kidney, RN administer Novolog correction and meal coverage now then discontinue Novolog correction and meal coverage orders once patient resumes his insulin pump today. Plan is still for patient to be discharged home.  Thanks, Barnie Alderman, RN, MSN, CDE Diabetes Coordinator Inpatient Diabetes Program [phone] (Team Pager from 8am to 5pm)

## 2018-12-19 LAB — URINE CULTURE: Culture: >=100000 — AB

## 2018-12-21 ENCOUNTER — Other Ambulatory Visit: Payer: Self-pay

## 2018-12-21 NOTE — Patient Outreach (Signed)
[facility]) Care Management  12/21/2018  [name] 15-Jul-1946 [id number]    EMMI-General Discharge RED ON EMMI ALERT Day # 1 Date: 12/20/2018 Red Alert Reason:" Know who to call about changes in condition? No"   Outreach attempt # 1 to patient. No answer at both numbers listed. RN CM left HIPAA compliant voicemail message along with contact info.     Plan: RN CM will make outreach attempt to patient within 3-4 business days. RN CM will send unsuccessful outreach letter to patient.  Enzo Montgomery, RN,BSN,CCM [facility] Telephonic Care Management Coordinator Direct Phone: [phone] Toll Free: [phone] Fax: [phone]

## 2018-12-22 ENCOUNTER — Other Ambulatory Visit: Payer: Self-pay

## 2018-12-22 NOTE — Patient Outreach (Signed)
[facility]) Care Management  12/22/2018  [name] 30-Jun-1946 [id number]   EMMI-General Discharge RED ON EMMI ALERT Day # 1 Date: 12/20/2018 Red Alert Reason:" Know who to call about changes in condition? No"   Outreach attempt #2 to patient. No answer at present.      Plan: RN CM will make outreach attempt to patient within 3-4 business days.  Enzo Montgomery, RN,BSN,CCM [facility] Telephonic Care Management Coordinator Direct Phone: [phone] Toll Free: [phone] Fax: [phone]

## 2018-12-23 ENCOUNTER — Other Ambulatory Visit: Payer: Self-pay

## 2018-12-23 NOTE — Patient Outreach (Signed)
[facility]) Care Management  12/23/2018  [name] 08-04-1946 [id number]   EMMI-General Discharge RED ON EMMI ALERT Day #1 Date:12/20/2018 Red Alert Reason:" Know who to call about changes in condition? No"     Outreach attempt #3 to patient. No answer at present.     Plan: RN CM will close case if no response from letter mailed to patient.   Enzo Montgomery, RN,BSN,CCM [facility] Telephonic Care Management Coordinator Direct Phone: [phone] Toll Free: [phone] Fax: [phone]

## 2018-12-29 DIAGNOSIS — E103212 Type 1 diabetes mellitus with mild nonproliferative diabetic retinopathy with macular edema, left eye: Secondary | ICD-10-CM | POA: Diagnosis not present

## 2018-12-29 DIAGNOSIS — E103312 Type 1 diabetes mellitus with moderate nonproliferative diabetic retinopathy with macular edema, left eye: Secondary | ICD-10-CM | POA: Diagnosis not present

## 2018-12-29 DIAGNOSIS — C674 Malignant neoplasm of posterior wall of bladder: Secondary | ICD-10-CM | POA: Diagnosis not present

## 2018-12-29 DIAGNOSIS — N401 Enlarged prostate with lower urinary tract symptoms: Secondary | ICD-10-CM | POA: Diagnosis not present

## 2018-12-31 ENCOUNTER — Other Ambulatory Visit: Payer: Self-pay | Admitting: Orthopedic Surgery

## 2018-12-31 DIAGNOSIS — E1010 Type 1 diabetes mellitus with ketoacidosis without coma: Secondary | ICD-10-CM | POA: Diagnosis not present

## 2018-12-31 DIAGNOSIS — C674 Malignant neoplasm of posterior wall of bladder: Secondary | ICD-10-CM

## 2018-12-31 DIAGNOSIS — C679 Malignant neoplasm of bladder, unspecified: Secondary | ICD-10-CM

## 2019-01-01 ENCOUNTER — Other Ambulatory Visit: Payer: Self-pay

## 2019-01-01 NOTE — Patient Outreach (Signed)
[facility]) Care Management  01/01/2019  [name] [birth year] [id number]    EMMI-General Discharge RED ON EMMI ALERT Day #1 Date:12/20/2018 Red Alert Reason:" Know who to call about changes in condition? No"   Multiple attempts to establish contact with patient without success. No response from letter mailed to patient. Case is being closed at this time.     Plan: RN CM will close case at this time.   Enzo Montgomery, RN,BSN,CCM [place] Telephonic Care Management Coordinator Direct Phone: [phone] Toll Free: [phone] Fax: [phone]

## 2019-01-08 ENCOUNTER — Ambulatory Visit
Admission: RE | Admit: 2019-01-08 | Discharge: 2019-01-08 | Disposition: A | Discharge status: Home / Self Care | Payer: Medicare HMO | Source: Ambulatory Visit | Attending: Orthopedic Surgery | Admitting: Orthopedic Surgery

## 2019-01-08 DIAGNOSIS — N323 Diverticulum of bladder: Secondary | ICD-10-CM | POA: Diagnosis not present

## 2019-01-08 DIAGNOSIS — C679 Malignant neoplasm of bladder, unspecified: Secondary | ICD-10-CM | POA: Diagnosis not present

## 2019-01-08 MED ORDER — IOHEXOL 300 MG/ML  SOLN
125.0000 mL | Freq: Once | INTRAMUSCULAR | Status: AC | PRN
  Administered 2019-01-08: 125 mL via INTRAVENOUS

## 2019-01-09 DIAGNOSIS — E109 Type 1 diabetes mellitus without complications: Secondary | ICD-10-CM | POA: Diagnosis not present

## 2019-01-12 DIAGNOSIS — N401 Enlarged prostate with lower urinary tract symptoms: Secondary | ICD-10-CM | POA: Diagnosis not present

## 2019-01-12 DIAGNOSIS — C674 Malignant neoplasm of posterior wall of bladder: Secondary | ICD-10-CM | POA: Diagnosis not present

## 2019-01-13 DIAGNOSIS — R809 Proteinuria, unspecified: Secondary | ICD-10-CM | POA: Diagnosis not present

## 2019-01-13 DIAGNOSIS — Z9641 Presence of insulin pump (external) (internal): Secondary | ICD-10-CM | POA: Diagnosis not present

## 2019-01-13 DIAGNOSIS — E1029 Type 1 diabetes mellitus with other diabetic kidney complication: Secondary | ICD-10-CM | POA: Diagnosis not present

## 2019-01-13 DIAGNOSIS — E1042 Type 1 diabetes mellitus with diabetic polyneuropathy: Secondary | ICD-10-CM | POA: Diagnosis not present

## 2019-01-13 DIAGNOSIS — E103212 Type 1 diabetes mellitus with mild nonproliferative diabetic retinopathy with macular edema, left eye: Secondary | ICD-10-CM | POA: Diagnosis not present

## 2019-01-13 DIAGNOSIS — E1059 Type 1 diabetes mellitus with other circulatory complications: Secondary | ICD-10-CM | POA: Diagnosis not present

## 2019-02-09 DIAGNOSIS — E109 Type 1 diabetes mellitus without complications: Secondary | ICD-10-CM | POA: Diagnosis not present

## 2019-03-11 DIAGNOSIS — E109 Type 1 diabetes mellitus without complications: Secondary | ICD-10-CM | POA: Diagnosis not present

## 2019-03-12 DIAGNOSIS — E109 Type 1 diabetes mellitus without complications: Secondary | ICD-10-CM | POA: Diagnosis not present

## 2019-03-15 ENCOUNTER — Other Ambulatory Visit: Payer: Self-pay | Admitting: Cardiovascular Disease

## 2019-03-22 DIAGNOSIS — E109 Type 1 diabetes mellitus without complications: Secondary | ICD-10-CM | POA: Diagnosis not present

## 2019-04-08 DIAGNOSIS — Z8551 Personal history of malignant neoplasm of bladder: Secondary | ICD-10-CM | POA: Diagnosis not present

## 2019-04-08 DIAGNOSIS — N323 Diverticulum of bladder: Secondary | ICD-10-CM | POA: Diagnosis not present

## 2019-04-08 DIAGNOSIS — R3129 Other microscopic hematuria: Secondary | ICD-10-CM | POA: Diagnosis not present

## 2019-04-08 DIAGNOSIS — C674 Malignant neoplasm of posterior wall of bladder: Secondary | ICD-10-CM | POA: Diagnosis not present

## 2019-04-08 DIAGNOSIS — N401 Enlarged prostate with lower urinary tract symptoms: Secondary | ICD-10-CM | POA: Diagnosis not present

## 2019-04-11 DIAGNOSIS — E109 Type 1 diabetes mellitus without complications: Secondary | ICD-10-CM | POA: Diagnosis not present

## 2019-04-12 DIAGNOSIS — N323 Diverticulum of bladder: Secondary | ICD-10-CM | POA: Diagnosis not present

## 2019-04-12 DIAGNOSIS — R3129 Other microscopic hematuria: Secondary | ICD-10-CM | POA: Diagnosis not present

## 2019-04-12 DIAGNOSIS — C674 Malignant neoplasm of posterior wall of bladder: Secondary | ICD-10-CM | POA: Diagnosis not present

## 2019-04-12 DIAGNOSIS — N401 Enlarged prostate with lower urinary tract symptoms: Secondary | ICD-10-CM | POA: Diagnosis not present

## 2019-04-19 DIAGNOSIS — R3129 Other microscopic hematuria: Secondary | ICD-10-CM | POA: Diagnosis not present

## 2019-04-19 DIAGNOSIS — R300 Dysuria: Secondary | ICD-10-CM | POA: Diagnosis not present

## 2019-04-19 DIAGNOSIS — Z8551 Personal history of malignant neoplasm of bladder: Secondary | ICD-10-CM | POA: Diagnosis not present

## 2019-05-05 DIAGNOSIS — R3129 Other microscopic hematuria: Secondary | ICD-10-CM | POA: Diagnosis not present

## 2019-05-05 DIAGNOSIS — Z8551 Personal history of malignant neoplasm of bladder: Secondary | ICD-10-CM | POA: Diagnosis not present

## 2019-05-05 DIAGNOSIS — R300 Dysuria: Secondary | ICD-10-CM | POA: Diagnosis not present

## 2019-05-10 DIAGNOSIS — R300 Dysuria: Secondary | ICD-10-CM | POA: Diagnosis not present

## 2019-05-10 DIAGNOSIS — Z8551 Personal history of malignant neoplasm of bladder: Secondary | ICD-10-CM | POA: Diagnosis not present

## 2019-05-11 DIAGNOSIS — E109 Type 1 diabetes mellitus without complications: Secondary | ICD-10-CM | POA: Diagnosis not present

## 2019-05-17 DIAGNOSIS — Z79899 Other long term (current) drug therapy: Secondary | ICD-10-CM | POA: Diagnosis not present

## 2019-05-17 DIAGNOSIS — Z8551 Personal history of malignant neoplasm of bladder: Secondary | ICD-10-CM | POA: Diagnosis not present

## 2019-05-19 DIAGNOSIS — N401 Enlarged prostate with lower urinary tract symptoms: Secondary | ICD-10-CM | POA: Diagnosis not present

## 2019-05-19 DIAGNOSIS — Z8551 Personal history of malignant neoplasm of bladder: Secondary | ICD-10-CM | POA: Diagnosis not present

## 2019-05-19 DIAGNOSIS — R300 Dysuria: Secondary | ICD-10-CM | POA: Diagnosis not present

## 2019-05-19 DIAGNOSIS — Z79899 Other long term (current) drug therapy: Secondary | ICD-10-CM | POA: Diagnosis not present

## 2019-05-26 DIAGNOSIS — R300 Dysuria: Secondary | ICD-10-CM | POA: Diagnosis not present

## 2019-05-26 DIAGNOSIS — Z8551 Personal history of malignant neoplasm of bladder: Secondary | ICD-10-CM | POA: Diagnosis not present

## 2019-05-26 DIAGNOSIS — N401 Enlarged prostate with lower urinary tract symptoms: Secondary | ICD-10-CM | POA: Diagnosis not present

## 2019-05-27 ENCOUNTER — Other Ambulatory Visit: Payer: Self-pay | Admitting: Urology

## 2019-05-27 DIAGNOSIS — C679 Malignant neoplasm of bladder, unspecified: Secondary | ICD-10-CM

## 2019-06-02 ENCOUNTER — Ambulatory Visit
Admission: RE | Admit: 2019-06-02 | Discharge: 2019-06-02 | Disposition: A | Discharge status: Home / Self Care | Payer: Medicare HMO | Source: Ambulatory Visit | Attending: Urology | Admitting: Urology

## 2019-06-02 ENCOUNTER — Other Ambulatory Visit: Payer: Self-pay

## 2019-06-02 DIAGNOSIS — C679 Malignant neoplasm of bladder, unspecified: Secondary | ICD-10-CM

## 2019-06-02 DIAGNOSIS — Z5111 Encounter for antineoplastic chemotherapy: Secondary | ICD-10-CM | POA: Diagnosis not present

## 2019-06-02 DIAGNOSIS — C678 Malignant neoplasm of overlapping sites of bladder: Secondary | ICD-10-CM | POA: Diagnosis not present

## 2019-06-02 LAB — POCT I-STAT CREATININE: Creatinine, Ser: 0.9 mg/dL (ref 0.61–1.24)

## 2019-06-02 MED ORDER — IOHEXOL 300 MG/ML  SOLN
125.0000 mL | Freq: Once | INTRAMUSCULAR | Status: AC | PRN
  Administered 2019-06-02: 125 mL via INTRAVENOUS

## 2019-06-03 DIAGNOSIS — C674 Malignant neoplasm of posterior wall of bladder: Secondary | ICD-10-CM | POA: Diagnosis not present

## 2019-06-03 DIAGNOSIS — N401 Enlarged prostate with lower urinary tract symptoms: Secondary | ICD-10-CM | POA: Diagnosis not present

## 2019-06-03 DIAGNOSIS — Z79899 Other long term (current) drug therapy: Secondary | ICD-10-CM | POA: Diagnosis not present

## 2019-06-08 DIAGNOSIS — N401 Enlarged prostate with lower urinary tract symptoms: Secondary | ICD-10-CM | POA: Diagnosis not present

## 2019-06-08 DIAGNOSIS — Z125 Encounter for screening for malignant neoplasm of prostate: Secondary | ICD-10-CM | POA: Diagnosis not present

## 2019-06-08 DIAGNOSIS — Z79899 Other long term (current) drug therapy: Secondary | ICD-10-CM | POA: Diagnosis not present

## 2019-06-10 DIAGNOSIS — N401 Enlarged prostate with lower urinary tract symptoms: Secondary | ICD-10-CM | POA: Diagnosis not present

## 2019-06-10 DIAGNOSIS — N323 Diverticulum of bladder: Secondary | ICD-10-CM | POA: Diagnosis not present

## 2019-06-10 DIAGNOSIS — C674 Malignant neoplasm of posterior wall of bladder: Secondary | ICD-10-CM | POA: Diagnosis not present

## 2019-06-11 DIAGNOSIS — E109 Type 1 diabetes mellitus without complications: Secondary | ICD-10-CM | POA: Diagnosis not present

## 2019-06-14 DIAGNOSIS — E109 Type 1 diabetes mellitus without complications: Secondary | ICD-10-CM | POA: Diagnosis not present

## 2019-06-21 ENCOUNTER — Other Ambulatory Visit: Payer: Self-pay | Admitting: Cardiovascular Disease

## 2019-06-22 DIAGNOSIS — E109 Type 1 diabetes mellitus without complications: Secondary | ICD-10-CM | POA: Diagnosis not present

## 2019-06-22 DIAGNOSIS — D649 Anemia, unspecified: Secondary | ICD-10-CM | POA: Diagnosis not present

## 2019-06-28 DIAGNOSIS — C674 Malignant neoplasm of posterior wall of bladder: Secondary | ICD-10-CM | POA: Diagnosis not present

## 2019-06-28 DIAGNOSIS — R300 Dysuria: Secondary | ICD-10-CM | POA: Diagnosis not present

## 2019-06-28 DIAGNOSIS — N323 Diverticulum of bladder: Secondary | ICD-10-CM | POA: Diagnosis not present

## 2019-06-28 DIAGNOSIS — N401 Enlarged prostate with lower urinary tract symptoms: Secondary | ICD-10-CM | POA: Diagnosis not present

## 2019-06-29 DIAGNOSIS — D649 Anemia, unspecified: Secondary | ICD-10-CM | POA: Diagnosis not present

## 2019-07-05 NOTE — Progress Notes (Signed)
Patient ID: [name], male   DOB: 1946/07/26, 73 y.o.   MRN: [id number] Cardiology Office Note  Date:  07/06/2019   ID:  [name], DOB Jul 28, 1946, MRN [id number]  PCP:  [name] Pink, MD   Chief Complaint  Patient presents with  . Other    12 month follow up. Patient c/o swelling in ankles. patient denies chest pain and SOB. Meds reviewed verbally with patient.     HPI:  [name] is a very pleasant 73 year old gentleman with a history of coronary artery disease,  occluded LAD with stent placed February 2008 ( Cypher 3.0 x 8 mm DES stent),  poorly controlled diabetes, hypertension  Former smoker, quit late 40s bladder cancer,08/2017 finishing chemo (for one year) Lymphedema lower extremities who presents for routine followup of his coronary artery disease  Bladder cancer  treatments complete Had hematuria Weight down "10 pounds" Has a insulin pump,  HBA1C 7.6  [place] lots of m[place]  Lab work reviewed CR 0.9, BUN 28, potassium 5.2 Followed by endocrinology  Total chol 101 in late 2019  Takes losartan 25 , sometimes 1/2 pill.  Sometimes has to hold the pill as blood pressure runs low Coreg 12.5 twice daily  EKG personally reviewed by myself on todays visit Shows normal sinus rhythm rate 72 bpm consider old anteroseptal MI, PVC  Other past medical history He is active on a horse farm.  His previous angina was described as posterior neck pain.  On previous visits, he reported having night sweats. We held his carvedilol and Lipitor. Night sweats did not improve. told he had infection in his gums and has had significant work done. night sweats have improved    Cardiac catheter report from 2008 details 25% ostial left main, 100% mid LAD with stent placed at this location,40% proximal circumflex, 20% PL vessel disease Previous stress test showing scar in the mid to distal anterior wall. Previous lab work; Hemoglobin  A1c 8.2, total cholesterol 134, LDL 65  PMH:   has a past medical history of Anemia, Arthritis, Cancer ([name]), Coronary artery disease, Diabetes mellitus, Edema, GERD (gastroesophageal reflux disease), Heart attack ([name]) (2007), Hyperlipidemia, Hypertension, Hypoglycemia, Ischemic cardiomyopathy, Myocardial infarction [name]), and Peripheral vascular disease ([name]).  PSH:    Past Surgical History:  Procedure Laterality Date  . CARDIAC CATHETERIZATION    . COLONOSCOPY WITH PROPOFOL N/A 02/18/2018   Procedure: COLONOSCOPY WITH PROPOFOL;  Surgeon: [name], [name] Pike, MD;  Location: [facility] ENDOSCOPY;  Service: Gastroenterology;  Laterality: N/A;  . CORONARY ANGIOPLASTY    . CORONARY STENT PLACEMENT     To an occluded LAD x2  . CYSTOSCOPY WITH BIOPSY N/A 10/21/2017   Procedure: CYSTOSCOPY WITH BLADDER BIOPSY;  Surgeon: [name] Cowper, MD;  Location: [facility] ORS;  Service: Urology;  Laterality: N/A;  . CYSTOSCOPY WITH BIOPSY N/A 12/08/2018   Procedure: CYSTOSCOPY WITH BLADDER BIOPSY-MITOMYCIN;  Surgeon: [name] Cowper, MD;  Location: [facility] ORS;  Service: Urology;  Laterality: N/A;  . EXCISION MASS LOWER EXTREMETIES Right 06/05/2016   Procedure: EXCISION OF LESION RIGHT LOWER LEG;  Surgeon: [name] Robinson, MD;  Location: [facility] ORS;  Service: General;  Laterality: Right;  . SKIN CANCER EXCISION     chest  . TRANSURETHRAL RESECTION OF BLADDER TUMOR N/A 07/15/2017   Procedure: TRANSURETHRAL RESECTION OF BLADDER TUMOR (TURBT);  Surgeon: [name] Cowper, MD;  Location: [facility] ORS;  Service: Urology;  Laterality: N/A;    Current Outpatient Medications  Medication Sig  Dispense Refill  . aspirin EC 81 MG tablet Take 81 mg by mouth daily.    Marland Kitchen atorvastatin (LIPITOR) 80 MG tablet TAKE 1 TABLET BY MOUTH AT BEDTIME (Patient taking differently: Take 80 mg by mouth at bedtime. ) 90 tablet 3  . carvedilol (COREG) 12.5 MG tablet TAKE 1 TABLET BY MOUTH TWICE DAILY WITH FOOD 180 tablet 0  . glucagon (GLUCAGON EMERGENCY)  1 MG injection Inject 1 mg into the skin once as needed (for low blood sugar).     . hydrocortisone (ANUSOL-HC) 2.5 % rectal cream Apply 1 application topically 2 (two) times daily.    . Meth-Hyo-M Bl-Na Phos-Ph Sal (URIBEL) 118 MG CAPS Take 1 capsule (118 mg total) by mouth every 6 (six) hours as needed (dysuria). 40 capsule 3  . Multiple Vitamins-Minerals (CENTRUM SILVER PO) Take 1 tablet by mouth daily.    . mupirocin ointment (BACTROBAN) 2 % Apply 1 application topically 3 (three) times daily.  0  . NOVOLOG 100 UNIT/ML injection See admin instructions. Uses via inuslin pump  0  . tamsulosin (FLOMAX) 0.4 MG CAPS capsule Take 0.4 mg by mouth daily.    Marland Kitchen losartan (COZAAR) 25 MG tablet Take 1 tablet (25 mg total) by mouth daily. 90 tablet 3   No current facility-administered medications for this visit.      Allergies:   Patient has no known allergies.   Social History:  The patient  reports that he quit smoking about 24 years ago. His smoking use included cigarettes. He has a 10.00 pack-year smoking history. He has never used smokeless tobacco. He reports that he does not drink alcohol or use drugs.   Family History:   family history includes Heart attack in his mother; Heart disease in his father and mother; Prostate cancer in his brother.    Review of Systems: Review of Systems  Constitutional: Negative.   Respiratory: Negative.   Cardiovascular: Negative.   Gastrointestinal: Negative.   Musculoskeletal: Negative.   Neurological: Negative.   Psychiatric/Behavioral: Negative.   All other systems reviewed and are negative.    PHYSICAL EXAM: VS:  BP 120/64 (BP Location: Left Arm, Patient Position: Sitting, Cuff Size: Normal)   Pulse 72   Ht 5\' 9"  (1.753 m)   Wt 174 lb (78.9 kg)   BMI 25.70 kg/m  , BMI Body mass index is 25.7 kg/m. Constitutional:  oriented to person, place, and time. No distress.  HENT:  Head: Grossly normal Eyes:  no discharge. No scleral icterus.  Neck:  No JVD, no carotid bruits  Cardiovascular: Regular rate and rhythm, no murmurs appreciated 1-2+ pitting lower extremity edema Pulmonary/Chest: Clear to auscultation bilaterally, no wheezes or rails Abdominal: Soft.  no distension.  no tenderness.  Musculoskeletal: Normal range of motion Neurological:  normal muscle tone. Coordination normal. No atrophy Skin: Skin warm and dry Psychiatric: normal affect, pleasant   Recent Labs: 12/16/2018: ALT 26 12/18/2018: BUN 41; Hemoglobin 10.4; Platelets 228; Potassium 4.4; Sodium 139 06/02/2019: Creatinine, Ser 0.90    Lipid Panel Lab Results  Component Value Date   CHOL 101 06/08/2018   HDL 57 06/08/2018   LDLCALC 37 06/08/2018   TRIG 34 06/08/2018      Wt Readings from Last 3 Encounters:  07/06/19 174 lb (78.9 kg)  12/16/18 176 lb 5.9 oz (80 kg)  12/08/18 180 lb (81.6 kg)      ASSESSMENT AND PLAN:  Atherosclerosis of native coronary artery of native heart without angina pectoris - Plan: EKG  12-Lead No anginal symptoms CT scan no significant aortic atherosclerosis He does have coronary calcifications, stent x2 seen in the LAD Also with some very mild circumflex and RCA calcification -Cholesterol at goal, diabetes getting better, non-smoker No further testing ordered  HYPERTENSION, BENIGN - Plan: EKG 12-Lead Decrease coreg to 1/2 pill twice a day (6.25 twice a day) Take losartan 25 mg daily  Hyperlipidemia Hold on restarting zetia until new labs come back Continue statin Suspect he will be at goal  Controlled type 2 diabetes mellitus with other circulatory complication, without long-term current use of insulin ([name])  hemoglobin A1c 7.6 He has had diabetes for 50 years Followed by endocrinology Doing well  Lymphedema Suggested he see Dr. Dew/Dr. Ronalee Belts    Total encounter time more than 25 minutes  Greater than 50% was spent in counseling and coordination of care with the patient   Disposition:   F/U  12  months   Orders Placed This Encounter  Procedures  . EKG 12-Lead    Total encounter time more than 15 minutes  Greater than 50% was spent in counseling and coordination of care with the patient   Signed, Esmond Plants, M.D., Ph.D. 07/06/2019  [facility] [place], [place] [phone]

## 2019-07-06 ENCOUNTER — Ambulatory Visit (INDEPENDENT_AMBULATORY_CARE_PROVIDER_SITE_OTHER): Payer: Medicare HMO | Admitting: Cardiovascular Disease

## 2019-07-06 ENCOUNTER — Other Ambulatory Visit: Payer: Self-pay

## 2019-07-06 VITALS — BP 120/64 | HR 72 | Ht 69.0 in | Wt 174.0 lb

## 2019-07-06 DIAGNOSIS — I10 Essential (primary) hypertension: Secondary | ICD-10-CM

## 2019-07-06 DIAGNOSIS — I25118 Atherosclerotic heart disease of native coronary artery with other forms of angina pectoris: Secondary | ICD-10-CM | POA: Diagnosis not present

## 2019-07-06 DIAGNOSIS — R600 Localized edema: Secondary | ICD-10-CM | POA: Diagnosis not present

## 2019-07-06 DIAGNOSIS — E1159 Type 2 diabetes mellitus with other circulatory complications: Secondary | ICD-10-CM

## 2019-07-06 DIAGNOSIS — E782 Mixed hyperlipidemia: Secondary | ICD-10-CM | POA: Diagnosis not present

## 2019-07-06 NOTE — Patient Instructions (Addendum)
Medication Instructions:  Consider Decrease coreg to 1/2 pill twice a day (6.25 twice a day) Take losartan 25 mg daily   If you need a refill on your cardiac medications before your next appointment, please call your pharmacy.    Lab work: No new labs needed   If you have labs (blood work) drawn today and your tests are completely normal, you will receive your results only by: Marland Kitchen MyChart Message (if you have MyChart) OR . A paper copy in the mail If you have any lab test that is abnormal or we need to change your treatment, we will call you to review the results.   Testing/Procedures: No new testing needed   Follow-Up: At [facility], you and your health needs are our priority.  As part of our continuing mission to provide you with exceptional heart care, we have created designated Provider Care Teams.  These Care Teams include your primary Cardiologist (physician) and Advanced Practice Providers (APPs -  Physician Assistants and Nurse Practitioners) who all work together to provide you with the care you need, when you need it.  . You will need a follow up appointment in 12 months .   Please call our office 2 months in advance to schedule this appointment.    . Providers on your designated Care Team:   . Murray Hodgkins, NP . Christell Faith, [name] . Marrianne Mood, [name]  Any Other Special Instructions Will Be Listed Below (If Applicable).  For educational health videos Log in to : [email or web address] Or : [email or web address], password : [email or web address]

## 2019-07-12 DIAGNOSIS — E109 Type 1 diabetes mellitus without complications: Secondary | ICD-10-CM | POA: Diagnosis not present

## 2019-08-04 ENCOUNTER — Inpatient Hospital Stay: Admission: RE | Admit: 2019-08-04 | Payer: Medicare HMO | Source: Ambulatory Visit

## 2019-08-04 NOTE — Pre-Procedure Instructions (Signed)
Attempt was made x2 to call this patient at both numbers on file to complete his PAT phone call appointment today.

## 2019-08-05 NOTE — Pre-Procedure Instructions (Signed)
Attempted x 2 at home and on cell phone to reach patient.  Unable to make contact.  Called and notified Dr [facility] office of the above mentioned.

## 2019-08-05 NOTE — H&P (Signed)
NAME: [name], PONTES MEDICAL RECORD [id number] ACCOUNT [id number] DATE OF BIRTH:03/20/1946 FACILITY: [facility] LOCATION:  PHYSICIAN:MICHAEL R. WOLFF, MD  HISTORY AND PHYSICAL  DATE OF ADMISSION:  08/12/2019  CHIEF COMPLAINT:  Difficulty voiding.  HISTORY OF PRESENT ILLNESS:  The patient is a 73 year old white male with a long history of significant lower urinary tract symptoms.  He has significant lower urinary tract symptoms in spite of taking tamsulosin.  IPSS score was 16.  He has had several  cystoscopies in the past year and was found to have trilobar BPH with median lobe present.  Most recent cystoscopy was performed on 08/17 indicating a 4 cm prostatic urethral length with intravesical growth of the prostate.  He also had a bladder  diverticulum and a fibrotic area of the bladder with dysmorphic calcifications.  No bladder tumors were identified.  He comes in now for cystoscopy with possible bladder biopsy and photovaporization of the prostate with GreenLight laser.  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  He underwent TURBT in 2018 and had a followup biopsy in 10/2017 which did not reveal recurrent cancer, but had  atypia at the margins.  He received 4 cycles of intravesical mitomycin C with the last treatment given 08/17.  ALLERGIES:  No drug allergies.  CURRENT MEDICATIONS:  Plavix, losartan, Lasix, carvedilol, multivitamins, aspirin, and tamsulosin.  PAST SURGICAL HISTORY: 1.  Coronary artery stent placed in 2006. 2.  Transurethral resection of bladder tumor 2018.  SOCIAL HISTORY:  The patient quit smoking in 1998 with a 15-pack-year history.  He denied alcohol use.  FAMILY HISTORY:  Father died of myocardial infarction at age 17.  Mother died of heart disease at age 54.  PAST AND CURRENT MEDICAL CONDITIONS: 1.  Coronary artery disease status post MI in 2006. 2.   Diabetes since 1967. 3.  Hypertension. 4.  Bladder cancer.  REVIEW OF SYSTEMS:  The patient states that he bruises easily and relates this to Plavix and aspirin exposure.  He denied chest pain or shortness of breath.  He denied history of stroke.  PHYSICAL EXAMINATION: VITAL SIGNS:  Height was 5 feet 8 inches, weight 176 pounds. GENERAL:  Well-nourished white male in no acute distress. HEENT:  Sclerae were clear.  Pupils are equally round, reactive to light and accommodation. NECK:  No palpable cervical adenopathy. PULMONARY:  Lungs clear to auscultation. CARDIOVASCULAR:  Regular rhythm and rate without audible murmurs. ABDOMEN:  Soft, nontender abdomen.  No CVA tenderness. GENITOURINARY:  Circumcised.  Testes were smooth and nontender. RECTAL:  A 40 g, smooth, nontender prostate. NEUROMUSCULAR:  Alert and oriented x3.  IMPRESSION: 1.  Benign prostatic hypertrophy with bladder outlet obstruction. 2.  Bladder diverticulum. 3.  Dysmorphic calcification of the bladder with fibrosis at the prior site of bladder tumor resection.  PLAN: 1.  Photovaporization of prostate. 2.  Bladder biopsy.  LN/NUANCE  D:08/04/2019 T:08/04/2019 JOB:[id number]

## 2019-08-06 ENCOUNTER — Other Ambulatory Visit: Admission: RE | Admit: 2019-08-06 | Payer: Medicare HMO | Source: Ambulatory Visit

## 2019-08-09 ENCOUNTER — Other Ambulatory Visit: Payer: Self-pay

## 2019-08-09 ENCOUNTER — Encounter
Admission: RE | Admit: 2019-08-09 | Discharge: 2019-08-09 | Disposition: A | Discharge status: Home / Self Care | Payer: Medicare HMO | Source: Ambulatory Visit | Attending: Urology | Admitting: Urology

## 2019-08-09 ENCOUNTER — Other Ambulatory Visit
Admission: RE | Admit: 2019-08-09 | Discharge: 2019-08-09 | Disposition: A | Discharge status: Home / Self Care | Payer: Medicare HMO | Source: Ambulatory Visit | Attending: Urology | Admitting: Urology

## 2019-08-09 DIAGNOSIS — Z01812 Encounter for preprocedural laboratory examination: Secondary | ICD-10-CM | POA: Insufficient documentation

## 2019-08-09 DIAGNOSIS — K5790 Diverticulosis of intestine, part unspecified, without perforation or abscess without bleeding: Secondary | ICD-10-CM | POA: Insufficient documentation

## 2019-08-09 DIAGNOSIS — N400 Enlarged prostate without lower urinary tract symptoms: Secondary | ICD-10-CM | POA: Diagnosis not present

## 2019-08-09 DIAGNOSIS — Z20828 Contact with and (suspected) exposure to other viral communicable diseases: Secondary | ICD-10-CM | POA: Insufficient documentation

## 2019-08-09 NOTE — Patient Instructions (Addendum)
  Your procedure is scheduled on: Thursday August 12, 2019 Report to Same Day Surgery 2nd floor [facility] Entrance-take elevator on left to 2nd floor.  Check in with surgery information desk.) To find out your arrival time, call [phone] 1:00-3:00 PM on Wednesday August 11, 2019  Remember: Instructions that are not followed completely may result in serious medical risk, up to and including death, or upon the discretion of your surgeon and anesthesiologist your surgery may need to be rescheduled.    __x__ 1. Do not eat food (including mints, candies, chewing gum) after midnight the night before your procedure. You may drink water up to 2 hours before you are scheduled to arrive at the hospital for your procedure.  Do not drink anything within 2 hours of your scheduled arrival to the hospital.    __x__ 2. No Alcohol for 24 hours before or after surgery.   __x__ 3. No Smoking or e-cigarettes for 24 hours before surgery.  Do not use any chewable tobacco products for at least 6 hours before surgery.   __x__ 4. Notify your doctor if there is any change in your medical condition (cold, fever, infections).   __x__ 5. On the morning of surgery brush your teeth with toothpaste and water.  You may rinse your mouth with mouthwash if you wish.  Do not swallow any toothpaste or mouthwash.  Please read over the following fact sheets that you were given:   [facility] Preparing for Surgery and/or MRSA Information    __x__ Use CHG Soap as directed on instruction sheet.   Do not wear jewelry on the day of surgery.  Do not wear lotions, powders, deodorant, or perfumes.   Do not shave below the face/neck 48 hours prior to surgery.   Do not bring valuables to the hospital.    [facility] is not responsible for any belongings or valuables.               Contacts, dentures or bridgework may not be worn into surgery.  For patients discharged on the day of surgery, you will NOT be  permitted to drive yourself home.  You must have a responsible adult with you for 24 hours after surgery.  __x__ Take these medicines on the morning of surgery with a SMALL SIP OF WATER:  1. Carvedilol/Coreg  Skip your Losartan/Cozaar only on the morning of surgery.  __x__ Follow recommendations from Cardiologist, Pulmonologist or PCP regarding stopping blood thinners such as Aspirin, Coumadin, Plavix, Eliquis, Effient, Pradaxa, and Pletal.  __x__ STARTING TODAY: Do not take any Anti-inflammatories such as Advil, Ibuprofen, Motrin, Aleve, Naproxen, Naprosyn, BC/Goodies powders or aspirin products. You may continue to take Tylenol and Celebrex.   __x__ STARTING TODAY: Do not take any over the counter supplements until after surgery. It is okay to take your Vitamin B, and multivitamin.  Reviewed via telephone 08/09/2019 @ 10:40 am

## 2019-08-10 LAB — SARS CORONAVIRUS 2 (TAT 6-24 HRS): SARS Coronavirus 2: NEGATIVE

## 2019-08-11 DIAGNOSIS — E109 Type 1 diabetes mellitus without complications: Secondary | ICD-10-CM | POA: Diagnosis not present

## 2019-08-12 ENCOUNTER — Ambulatory Visit: Payer: Medicare HMO | Admitting: Certified Registered Nurse Anesthetist

## 2019-08-12 ENCOUNTER — Encounter: Payer: Self-pay | Admitting: *Deleted

## 2019-08-12 ENCOUNTER — Ambulatory Visit
Admission: RE | Admit: 2019-08-12 | Discharge: 2019-08-12 | Disposition: A | Discharge status: Home / Self Care | Payer: Medicare HMO | Attending: Urology | Admitting: Urology

## 2019-08-12 ENCOUNTER — Other Ambulatory Visit: Payer: Self-pay

## 2019-08-12 ENCOUNTER — Encounter
Admission: RE | Disposition: A | Discharge status: Home / Self Care | Payer: Self-pay | Source: Home / Self Care | Attending: Urology

## 2019-08-12 DIAGNOSIS — N400 Enlarged prostate without lower urinary tract symptoms: Secondary | ICD-10-CM | POA: Diagnosis not present

## 2019-08-12 DIAGNOSIS — N320 Bladder-neck obstruction: Secondary | ICD-10-CM | POA: Diagnosis not present

## 2019-08-12 DIAGNOSIS — Z79899 Other long term (current) drug therapy: Secondary | ICD-10-CM | POA: Diagnosis not present

## 2019-08-12 DIAGNOSIS — Z7902 Long term (current) use of antithrombotics/antiplatelets: Secondary | ICD-10-CM | POA: Diagnosis not present

## 2019-08-12 DIAGNOSIS — N401 Enlarged prostate with lower urinary tract symptoms: Secondary | ICD-10-CM | POA: Insufficient documentation

## 2019-08-12 DIAGNOSIS — Z8249 Family history of ischemic heart disease and other diseases of the circulatory system: Secondary | ICD-10-CM | POA: Insufficient documentation

## 2019-08-12 DIAGNOSIS — I252 Old myocardial infarction: Secondary | ICD-10-CM | POA: Diagnosis not present

## 2019-08-12 DIAGNOSIS — Z955 Presence of coronary angioplasty implant and graft: Secondary | ICD-10-CM | POA: Diagnosis not present

## 2019-08-12 DIAGNOSIS — E1110 Type 2 diabetes mellitus with ketoacidosis without coma: Secondary | ICD-10-CM | POA: Diagnosis not present

## 2019-08-12 DIAGNOSIS — I10 Essential (primary) hypertension: Secondary | ICD-10-CM | POA: Insufficient documentation

## 2019-08-12 DIAGNOSIS — N3289 Other specified disorders of bladder: Secondary | ICD-10-CM | POA: Insufficient documentation

## 2019-08-12 DIAGNOSIS — N329 Bladder disorder, unspecified: Secondary | ICD-10-CM | POA: Diagnosis not present

## 2019-08-12 DIAGNOSIS — N323 Diverticulum of bladder: Secondary | ICD-10-CM | POA: Insufficient documentation

## 2019-08-12 DIAGNOSIS — E785 Hyperlipidemia, unspecified: Secondary | ICD-10-CM | POA: Diagnosis not present

## 2019-08-12 DIAGNOSIS — I2510 Atherosclerotic heart disease of native coronary artery without angina pectoris: Secondary | ICD-10-CM | POA: Diagnosis not present

## 2019-08-12 DIAGNOSIS — N138 Other obstructive and reflux uropathy: Secondary | ICD-10-CM

## 2019-08-12 DIAGNOSIS — M1711 Unilateral primary osteoarthritis, right knee: Secondary | ICD-10-CM | POA: Insufficient documentation

## 2019-08-12 DIAGNOSIS — E1151 Type 2 diabetes mellitus with diabetic peripheral angiopathy without gangrene: Secondary | ICD-10-CM | POA: Diagnosis not present

## 2019-08-12 DIAGNOSIS — N3090 Cystitis, unspecified without hematuria: Secondary | ICD-10-CM | POA: Insufficient documentation

## 2019-08-12 DIAGNOSIS — Z7982 Long term (current) use of aspirin: Secondary | ICD-10-CM | POA: Diagnosis not present

## 2019-08-12 DIAGNOSIS — Z8551 Personal history of malignant neoplasm of bladder: Secondary | ICD-10-CM | POA: Insufficient documentation

## 2019-08-12 DIAGNOSIS — C679 Malignant neoplasm of bladder, unspecified: Secondary | ICD-10-CM | POA: Diagnosis not present

## 2019-08-12 DIAGNOSIS — I255 Ischemic cardiomyopathy: Secondary | ICD-10-CM | POA: Insufficient documentation

## 2019-08-12 DIAGNOSIS — Z9221 Personal history of antineoplastic chemotherapy: Secondary | ICD-10-CM | POA: Diagnosis not present

## 2019-08-12 DIAGNOSIS — Z87891 Personal history of nicotine dependence: Secondary | ICD-10-CM | POA: Diagnosis not present

## 2019-08-12 DIAGNOSIS — E162 Hypoglycemia, unspecified: Secondary | ICD-10-CM | POA: Diagnosis not present

## 2019-08-12 DIAGNOSIS — Z9861 Coronary angioplasty status: Secondary | ICD-10-CM | POA: Diagnosis not present

## 2019-08-12 DIAGNOSIS — K219 Gastro-esophageal reflux disease without esophagitis: Secondary | ICD-10-CM | POA: Diagnosis not present

## 2019-08-12 HISTORY — PX: URETERAL BIOPSY: SHX6688

## 2019-08-12 HISTORY — PX: GREEN LIGHT LASER TURP (TRANSURETHRAL RESECTION OF PROSTATE: SHX6260

## 2019-08-12 LAB — GLUCOSE, CAPILLARY
Glucose-Capillary: 144 mg/dL — ABNORMAL HIGH (ref 70–99)
Glucose-Capillary: 145 mg/dL — ABNORMAL HIGH (ref 70–99)

## 2019-08-12 LAB — POCT I-STAT, CHEM 8
BUN: 23 mg/dL (ref 8–23)
Calcium, Ion: 1.18 mmol/L (ref 1.15–1.40)
Chloride: 108 mmol/L (ref 98–111)
Creatinine, Ser: 0.9 mg/dL (ref 0.61–1.24)
Glucose, Bld: 147 mg/dL — ABNORMAL HIGH (ref 70–99)
HCT: 34 % — ABNORMAL LOW (ref 39.0–52.0)
Hemoglobin: 11.6 g/dL — ABNORMAL LOW (ref 13.0–17.0)
Potassium: 4.3 mmol/L (ref 3.5–5.1)
Sodium: 141 mmol/L (ref 135–145)
TCO2: 26 mmol/L (ref 22–32)

## 2019-08-12 SURGERY — GREEN LIGHT LASER TURP (TRANSURETHRAL RESECTION OF PROSTATE
Anesthesia: General

## 2019-08-12 MED ORDER — BELLADONNA ALKALOIDS-OPIUM 16.2-60 MG RE SUPP
RECTAL | Status: DC | PRN
  Administered 2019-08-12: 1 via RECTAL

## 2019-08-12 MED ORDER — FENTANYL CITRATE (PF) 100 MCG/2ML IJ SOLN
25.0000 ug | INTRAMUSCULAR | Status: DC | PRN
  Administered 2019-08-12 (×2): 50 ug via INTRAVENOUS

## 2019-08-12 MED ORDER — HYDROCODONE-ACETAMINOPHEN 7.5-325 MG PO TABS
1.0000 | ORAL_TABLET | Freq: Once | ORAL | Status: AC | PRN
  Administered 2019-08-12: 10:00:00 1 via ORAL
  Filled 2019-08-12: qty 1

## 2019-08-12 MED ORDER — LIDOCAINE HCL (CARDIAC) PF 100 MG/5ML IV SOSY
PREFILLED_SYRINGE | INTRAVENOUS | Status: DC | PRN
  Administered 2019-08-12: 60 mg via INTRAVENOUS

## 2019-08-12 MED ORDER — MIDAZOLAM HCL 2 MG/2ML IJ SOLN
INTRAMUSCULAR | Status: AC
  Filled 2019-08-12: qty 2

## 2019-08-12 MED ORDER — BELLADONNA ALKALOIDS-OPIUM 16.2-60 MG RE SUPP
RECTAL | Status: AC
  Filled 2019-08-12: qty 1

## 2019-08-12 MED ORDER — FENTANYL CITRATE (PF) 100 MCG/2ML IJ SOLN
INTRAMUSCULAR | Status: AC
  Administered 2019-08-12: 50 ug via INTRAVENOUS
  Filled 2019-08-12: qty 2

## 2019-08-12 MED ORDER — LIDOCAINE HCL URETHRAL/MUCOSAL 2 % EX GEL
CUTANEOUS | Status: AC
  Filled 2019-08-12: qty 10

## 2019-08-12 MED ORDER — ONDANSETRON HCL 4 MG/2ML IJ SOLN
INTRAMUSCULAR | Status: AC
  Filled 2019-08-12: qty 2

## 2019-08-12 MED ORDER — FENTANYL CITRATE (PF) 100 MCG/2ML IJ SOLN
INTRAMUSCULAR | Status: AC
  Filled 2019-08-12: qty 2

## 2019-08-12 MED ORDER — PHENYLEPHRINE HCL (PRESSORS) 10 MG/ML IV SOLN
INTRAVENOUS | Status: DC | PRN
  Administered 2019-08-12: 100 ug via INTRAVENOUS
  Administered 2019-08-12 (×2): 150 ug via INTRAVENOUS
  Administered 2019-08-12 (×4): 100 ug via INTRAVENOUS

## 2019-08-12 MED ORDER — DEXAMETHASONE SODIUM PHOSPHATE 10 MG/ML IJ SOLN
INTRAMUSCULAR | Status: AC
  Filled 2019-08-12: qty 1

## 2019-08-12 MED ORDER — LIDOCAINE HCL (PF) 2 % IJ SOLN
INTRAMUSCULAR | Status: AC
  Filled 2019-08-12: qty 10

## 2019-08-12 MED ORDER — MIDAZOLAM HCL 2 MG/2ML IJ SOLN
INTRAMUSCULAR | Status: DC | PRN
  Administered 2019-08-12: 2 mg via INTRAVENOUS

## 2019-08-12 MED ORDER — PHENYLEPHRINE HCL (PRESSORS) 10 MG/ML IV SOLN
INTRAVENOUS | Status: AC
  Filled 2019-08-12: qty 1

## 2019-08-12 MED ORDER — DEXMEDETOMIDINE HCL IN NACL 80 MCG/20ML IV SOLN
INTRAVENOUS | Status: AC
  Filled 2019-08-12: qty 20

## 2019-08-12 MED ORDER — MEPERIDINE HCL 50 MG/ML IJ SOLN: 6.2500 mg | INTRAMUSCULAR | Status: DC | PRN

## 2019-08-12 MED ORDER — ACETAMINOPHEN 160 MG/5ML PO SOLN
325.0000 mg | ORAL | Status: DC | PRN
  Filled 2019-08-12: qty 20.3

## 2019-08-12 MED ORDER — PROPOFOL 10 MG/ML IV BOLUS
INTRAVENOUS | Status: AC
  Filled 2019-08-12: qty 20

## 2019-08-12 MED ORDER — FAMOTIDINE 20 MG PO TABS
ORAL_TABLET | ORAL | Status: AC
  Administered 2019-08-12: 06:00:00 20 mg via ORAL
  Filled 2019-08-12: qty 1

## 2019-08-12 MED ORDER — CEFAZOLIN SODIUM-DEXTROSE 1-4 GM/50ML-% IV SOLN
1.0000 g | Freq: Once | INTRAVENOUS | Status: AC
  Administered 2019-08-12: 08:00:00 1 g via INTRAVENOUS

## 2019-08-12 MED ORDER — DEXMEDETOMIDINE HCL 200 MCG/2ML IV SOLN
INTRAVENOUS | Status: DC | PRN
  Administered 2019-08-12 (×2): 8 ug via INTRAVENOUS

## 2019-08-12 MED ORDER — FAMOTIDINE 20 MG PO TABS
20.0000 mg | ORAL_TABLET | Freq: Once | ORAL | Status: AC
  Administered 2019-08-12: 06:00:00 20 mg via ORAL

## 2019-08-12 MED ORDER — URIBEL 118 MG PO CAPS: 1.0000 | ORAL_CAPSULE | Freq: Four times a day (QID) | ORAL | 3 refills | Status: AC | PRN

## 2019-08-12 MED ORDER — DOCUSATE SODIUM 100 MG PO CAPS: 200.0000 mg | ORAL_CAPSULE | Freq: Two times a day (BID) | ORAL | 3 refills | Status: AC

## 2019-08-12 MED ORDER — PROMETHAZINE HCL 25 MG/ML IJ SOLN: 6.2500 mg | INTRAMUSCULAR | Status: DC | PRN

## 2019-08-12 MED ORDER — PROPOFOL 10 MG/ML IV BOLUS
INTRAVENOUS | Status: DC | PRN
  Administered 2019-08-12: 150 mg via INTRAVENOUS

## 2019-08-12 MED ORDER — HYDROCODONE-ACETAMINOPHEN 7.5-325 MG PO TABS
ORAL_TABLET | ORAL | Status: AC
  Administered 2019-08-12: 10:00:00 1 via ORAL
  Filled 2019-08-12: qty 1

## 2019-08-12 MED ORDER — SODIUM CHLORIDE 0.9 % IV SOLN
INTRAVENOUS | Status: DC
  Administered 2019-08-12: 07:00:00 via INTRAVENOUS

## 2019-08-12 MED ORDER — DEXAMETHASONE SODIUM PHOSPHATE 10 MG/ML IJ SOLN
INTRAMUSCULAR | Status: DC | PRN
  Administered 2019-08-12: 10 mg via INTRAVENOUS

## 2019-08-12 MED ORDER — CEFAZOLIN SODIUM-DEXTROSE 1-4 GM/50ML-% IV SOLN
INTRAVENOUS | Status: AC
  Filled 2019-08-12: qty 50

## 2019-08-12 MED ORDER — CIPROFLOXACIN HCL 500 MG PO TABS: 500.0000 mg | ORAL_TABLET | Freq: Two times a day (BID) | ORAL | 1 refills | Status: DC

## 2019-08-12 MED ORDER — FENTANYL CITRATE (PF) 100 MCG/2ML IJ SOLN
INTRAMUSCULAR | Status: DC | PRN
  Administered 2019-08-12 (×5): 25 ug via INTRAVENOUS

## 2019-08-12 MED ORDER — LIDOCAINE HCL URETHRAL/MUCOSAL 2 % EX GEL
CUTANEOUS | Status: DC | PRN
  Administered 2019-08-12: 1

## 2019-08-12 MED ORDER — ONDANSETRON HCL 4 MG/2ML IJ SOLN
INTRAMUSCULAR | Status: DC | PRN
  Administered 2019-08-12: 4 mg via INTRAVENOUS

## 2019-08-12 MED ORDER — ACETAMINOPHEN 325 MG PO TABS: 325.0000 mg | ORAL_TABLET | ORAL | Status: DC | PRN

## 2019-08-12 SURGICAL SUPPLY — 33 items
ADAPTER IRRIG TUBE 2 SPIKE SOL (ADAPTER) ×6 IMPLANT
BAG URINE DRAINAGE (UROLOGICAL SUPPLIES) ×3 IMPLANT
CATH FOLEY 2WAY  5CC 20FR SIL (CATHETERS)
CATH FOLEY 2WAY 5CC 20FR SIL (CATHETERS) IMPLANT
COVER MAYO STAND REUSABLE (DRAPES) ×1 IMPLANT
COVER WAND RF STERILE (DRAPES) ×1 IMPLANT
DRSG TELFA 4X3 1S NADH ST (GAUZE/BANDAGES/DRESSINGS) ×2 IMPLANT
ELECT LOOP 22F BIPOLAR SML (ELECTROSURGICAL) ×3
ELECTRODE LOOP 22F BIPOLAR SML (ELECTROSURGICAL) IMPLANT
GLOVE BIO SURGEON STRL SZ7 (GLOVE) ×6 IMPLANT
GLOVE BIO SURGEON STRL SZ7.5 (GLOVE) ×3 IMPLANT
GOWN STRL REUS W/ TWL LRG LVL3 (GOWN DISPOSABLE) ×1 IMPLANT
GOWN STRL REUS W/ TWL XL LVL3 (GOWN DISPOSABLE) ×1 IMPLANT
GOWN STRL REUS W/TWL LRG LVL3 (GOWN DISPOSABLE) ×2
GOWN STRL REUS W/TWL XL LVL3 (GOWN DISPOSABLE) ×2
GUIDE NDL ENDOCAV 16-18 CVR (NEEDLE) IMPLANT
GUIDE NEEDLE ENDOCAV 16-18 CVR (NEEDLE) IMPLANT
GUIDEWIRE STR DUAL SENSOR (WIRE) ×2 IMPLANT
INST BIOPSY MAXCORE 18GX25 (NEEDLE) ×1 IMPLANT
IV NS 1000ML (IV SOLUTION) ×2
IV NS 1000ML BAXH (IV SOLUTION) ×1 IMPLANT
IV SET PRIMARY 15D 139IN B9900 (IV SETS) ×3 IMPLANT
KIT TURNOVER CYSTO (KITS) ×3 IMPLANT
NDL GUIDE BIOPSY 644068 (NEEDLE) IMPLANT
NEEDLE GUIDE BIOPSY 644068 (NEEDLE) IMPLANT
PACK CYSTO AR (MISCELLANEOUS) ×3 IMPLANT
SET IRRIG Y TYPE TUR BLADDER L (SET/KITS/TRAYS/PACK) ×3 IMPLANT
SOL .9 NS 3000ML IRR  AL (IV SOLUTION) ×8
SOL .9 NS 3000ML IRR UROMATIC (IV SOLUTION) ×4 IMPLANT
SURGILUBE 2OZ TUBE FLIPTOP (MISCELLANEOUS) ×3 IMPLANT
SYRINGE IRR TOOMEY STRL 70CC (SYRINGE) ×3 IMPLANT
TOWEL OR 17X26 4PK STRL BLUE (TOWEL DISPOSABLE) ×1 IMPLANT
WATER STERILE IRR 1000ML POUR (IV SOLUTION) ×3 IMPLANT

## 2019-08-12 NOTE — Op Note (Signed)
Preoperative diagnosis: 1.  BPH with bladder outlet obstruction                                             2.  Bladder cancer that is post intravesical chemotherapy                                             3.  Bladder diverticulum                                             4.  Small capacity bladder  Postoperative diagnosis: Same  Procedure: 1.  Photo vaporization prostate with greenlight laser                      2.  Bladder biopsy  Surgeon: Otelia Limes. Yves Dill MD  Anesthesia: General  Indications:See the history and physical. After informed consent the above procedure(s) were requested     Technique and findings: After adequate general anesthesia had been obtained the patient was placed into dorsal lithotomy position and the perineum was prepped and draped in the usual fashion.  Urethra was dilated up to 24 [place] with [employer].  The resectoscope was coupled with the camera and was then visually advanced into the bladder.  The bladder was thoroughly inspected.  Patient had fibrotic bladder mucosa with dysmorphic calcifications present on both lateral sides.  He had a large posterior wall bladder diverticulum  No bladder tumors were identified.  Using the loop electrode several biopsies were taken of the dysmorphic calcified areas and fibrosis.  Specimens were evacuated and sent to the pathologist.  Biopsy sites were cauterized with the loop electrode.  At this point the resectoscope was removed and exchanged for the laser scope.  The greenlight XPS laser fiber was introduced through the scope and set at 86 W.  Patient had trilobar BPH with intravesical growth of the median lobe posteriorly in the midline.  Both ureteral orifices were identified and carefully preserved.  The median lobe was then vaporized.  The power was then increased up to 120 W and remaining obstructive tissue from the bladder neck to the verumontanum was vaporized.  Bleeders were controlled with the electrocautery.   A guidewire was placed and resectoscope removed.  A 20 [place] council catheter was then advanced over the guidewire and positioned in the bladder.  The guidewire was then removed and the catheter was irrigated until clear.  Ten cc. of viscous Xylocaine was instilled within the urethra and a B&O suppository was placed.  Estimated blood loss was 50 cc.  Procedure was then terminated and patient transferred to the recovery room in stable condition.

## 2019-08-12 NOTE — Transfer of Care (Signed)
Immediate Anesthesia Transfer of Care Note  Patient: [name]  Procedure(s) Performed: GREEN LIGHT LASER TURP (TRANSURETHRAL RESECTION OF PROSTATE, BLADDER BIOPSY (N/A ) URETERAL BIOPSY (N/A )  Patient Location: PACU  Anesthesia Type:General  Level of Consciousness: drowsy  Airway & Oxygen Therapy: Patient Spontanous Breathing and Patient connected to face mask oxygen  Post-op Assessment: Report given to RN and Post -op Vital signs reviewed and stable  Post vital signs: Reviewed and stable  Last Vitals:  Vitals Value Taken Time  BP 91/50 08/12/19 0909  Temp 98.2   Pulse 68 08/12/19 0910  Resp 12 08/12/19 0910  SpO2 99 % 08/12/19 0910  Vitals shown include unvalidated device data.  Last Pain:  Vitals:   08/12/19 0703  TempSrc: Tympanic  PainSc: 0-No pain         Complications: No apparent anesthesia complications

## 2019-08-12 NOTE — Discharge Instructions (Addendum)
[name] Laser Prostate Treatment, Care After This sheet gives you information about how to care for yourself after your procedure. Your health care provider may also give you more specific instructions. If you have problems or questions, contact your health care provider. What can I expect after the procedure? After the procedure, it is common to have:  Swelling and discomfort around your urethra. The opening of the urethra is at the end of the penis.  Blood in your urine. This should go away after a few days.  Trouble urinating or sudden need to urinate (urgency). These problems should get better over time. You may continue to have a thin tube (catheter) inserted into your urethra to help drain your urine from your bladder for a few days after the procedure. Follow these instructions at home: Medicines  Take over-the-counter and prescription medicines only as told by your health care provider.  If you were prescribed an antibiotic medicine, take it as told by your health care provider. Do not stop taking the antibiotic even if you start to feel better. Bathing  Do not take baths, swim, or use a hot tub until your health care provider approves. Ask your health care provider if you may take showers. You may only be allowed to take sponge baths. Activity   Do not drive for 24 hours if you were given a medicine to help you relax (sedative) during your procedure.  Do not drive or use heavy machinery while taking prescription pain medicine.  Ask your health care provider what activities are safe for you. Most people can return to normal activities within a few days. ? Do not have sex or engage in sexual activity until your health care provider approves. ? Do not lift anything that is heavier than 10 lb (4.5 kg), or the limit that you are told, until your health care provider says that it is safe. General instructions      If you have a urinary catheter, care for it as told by your  health care provider. This may include: ? Washing your hands before and after touching the catheter. ? Emptying your drainage bag when it is ?- full, or emptying it at least 2-3 times a day. ? Keeping the area around the catheter clean and dry. ? Avoiding any bends or breaks in the catheter. ? Keeping air out of the catheter. ? Making sure that the catheter is not placed under water.  Do not use any products that contain nicotine or tobacco, such as cigarettes and e-cigarettes. If you need help quitting, ask your health care provider.  Drink enough fluid to keep your urine pale yellow.  Keep all follow-up visits as told by your health care provider. This is important. Contact a health care provider if:  You have trouble: ? Having a bowel movement. ? Getting an erection.  You have swelling around your urethra and it gets worse.  You have blood in your urine for more than 2 days after the procedure.  You have pain or burning when you urinate, or other problems that do not go away or cause discomfort.  You have problems with your catheter or your catheter is blocked.  You have a fever.  You have nausea or you vomit.  You have swelling in your legs. Get help right away if:  Your urine has blood clots in it.  Your urine is dark red.  You cannot urinate after your catheter is removed.  You have blood in your  stool.  You have severe pain that does not get better with medicine.  You have shortness of breath. Summary  After the procedure, it is common to have swelling and discomfort around your urethra and blood in your urine for a few days.  Some men may have problems urinating after this procedure. These problems should go away after a few days. If you have pain or burning while urinating, contact your health care provider.  If you have a catheter after this procedure, care for it as told by your health care provider.  If you have severe pain, dark red urine, or urine  with blood clots, get medical help right away. This information is not intended to replace advice given to you by your health care provider. Make sure you discuss any questions you have with your health care provider. Document Released: 05/08/2017 Document Revised: 02/18/2019 Document Reviewed: 05/08/2017 Elsevier Patient Education  [place], Adult An indwelling urinary catheter is a thin, flexible, germ-free (sterile) tube that is placed into the bladder to help drain urine out of the body. The catheter is inserted into the part of the body that drains urine from the bladder (urethra). Urine drains from the catheter into a drainage bag outside of the body. Taking good care of your catheter will keep it working properly and help to prevent problems from developing. What are the risks?  Bacteria may get into your bladder and cause a urinary tract infection.  Urine flow can become blocked. This can happen if the catheter is not working correctly, or if you have sediment or a blood clot in your bladder or the catheter.  Tissue near the catheter may become irritated and bleed. How to wear your catheter and your drainage bag Supplies needed  Adhesive tape or a leg strap.  Alcohol wipe or soap and water (if you use tape).  A clean towel (if you use tape).  Overnight drainage bag.  Smaller drainage bag (leg bag). Wearing your catheter and bag Use adhesive tape or a leg strap to attach your catheter to your leg.  Make sure the catheter is not pulled tight.  If a leg strap gets wet, replace it with a dry one.  If you use adhesive tape: 1. Use an alcohol wipe or soap and water to wash off any stickiness on your skin where you had tape before. 2. Use a clean towel to pat-dry the area. 3. Apply the new tape. You should have received a large overnight drainage bag and a smaller leg bag that fits underneath clothing.  You may wear the overnight  bag at any time, but you should not wear the leg bag at night.  Always wear the leg bag below your knee.  Make sure the overnight drainage bag is always lower than the level of your bladder, but do not let it touch the floor. Before you go to sleep, hang the bag inside a wastebasket that is covered by a clean plastic bag. How to care for your skin around the catheter     Supplies needed  A clean washcloth.  Water and mild soap.  A clean towel. Caring for your skin and catheter  Every day, use a clean washcloth and soapy water to clean the skin around your catheter. ? Wash your hands with soap and water. ? Wet a washcloth in warm water and mild soap. ? Clean the skin around your urethra. ? If you are male: ?  Use one hand to gently spread the folds of skin around your vagina (labia). ? With the washcloth in your other hand, wipe the inner side of your labia on each side. Do this in a front-to-back direction. ? If you are male: ? Use one hand to pull back any skin that covers the end of your penis (foreskin). ? With the washcloth in your other hand, wipe your penis in small circles. Start wiping at the tip of your penis, then move outward from the catheter. ? Move the foreskin back in place, if this applies. ? With your free hand, hold the catheter close to where it enters your body. Keep holding the catheter during cleaning so it does not get pulled out. ? Use your other hand to clean the catheter with the washcloth. ? Only wipe downward on the catheter. ? Do not wipe upward toward your body, because that may push bacteria into your urethra and cause infection. ? Use a clean towel to pat-dry the catheter and the skin around it. Make sure to wipe off all soap. ? Wash your hands with soap and water.  Shower every day. Do not take baths.  Do not use cream, ointment, or lotion on the area where the catheter enters your body, unless your health care provider tells you to do  that.  Do not use powders, sprays, or lotions on your genital area.  Check your skin around the catheter every day for signs of infection. Check for: ? Redness, swelling, or pain. ? Fluid or blood. ? Warmth. ? Pus or a bad smell. How to empty the drainage bag Supplies needed  Rubbing alcohol.  Gauze pad or cotton ball.  Adhesive tape or a leg strap. Emptying the bag Empty your drainage bag (your overnight drainage bag or your leg bag) when it is ?- full, or at least 2-3 times a day. Clean the drainage bag according to the manufacturer's instructions or as told by your health care provider. 1. Wash your hands with soap and water. 2. Detach the drainage bag from your leg. 3. Hold the drainage bag over the toilet or a clean container. Make sure the drainage bag is lower than your hips and bladder. This stops urine from going back into the tubing and into your bladder. 4. Open the pour spout at the bottom of the bag. 5. Empty the urine into the toilet or container. Do not let the pour spout touch any surface. This precaution is important to prevent bacteria from getting in the bag and causing infection. 6. Apply rubbing alcohol to a gauze pad or cotton ball. 7. Use the gauze pad or cotton ball to clean the pour spout. 8. Close the pour spout. 9. Attach the bag to your leg with adhesive tape or a leg strap. 10. Wash your hands with soap and water. How to change the drainage bag Supplies needed:  Alcohol wipes.  A clean drainage bag.  Adhesive tape or a leg strap. Changing the bag Replace your drainage bag with a clean bag if it leaks, starts to smell bad, or looks dirty. 1. Wash your hands with soap and water. 2. Detach the dirty drainage bag from your leg. 3. Pinch the catheter with your fingers so that urine does not spill out. 4. Disconnect the catheter tube from the drainage tube at the connection valve. Do not let the tubes touch any surface. 5. Clean the end of the  catheter tube with an alcohol wipe. Use a  different alcohol wipe to clean the end of the drainage tube. 6. Connect the catheter tube to the drainage tube of the clean bag. 7. Attach the clean bag to your leg with adhesive tape or a leg strap. Avoid attaching the new bag too tightly. 8. Wash your hands with soap and water. General instructions   Never pull on your catheter or try to remove it. Pulling can damage your internal tissues.  Always wash your hands before and after you handle your catheter or drainage bag. Use a mild, fragrance-free soap. If soap and water are not available, use hand sanitizer.  Always make sure there are no twists or bends (kinks) in the catheter tube.  Always make sure there are no leaks in the catheter or drainage bag.  Drink enough fluid to keep your urine pale yellow.  Do not take baths, swim, or use a hot tub.  If you are male, wipe from front to back after having a bowel movement. Contact a health care provider if:  Your urine is cloudy.  Your urine smells unusually bad.  Your catheter gets clogged.  Your catheter starts to leak.  Your bladder feels full. Get help right away if:  You have redness, swelling, or pain where the catheter enters your body.  You have fluid, blood, pus, or a bad smell coming from the area where the catheter enters your body.  The area where the catheter enters your body feels warm to the touch.  You have a fever.  You have pain in your abdomen, legs, lower back, or bladder.  You see blood in the catheter.  Your urine is pink or red.  You have nausea, vomiting, or chills.  Your urine is not draining into the bag.  Your catheter gets pulled out. Summary  An indwelling urinary catheter is a thin, flexible, germ-free (sterile) tube that is placed into the bladder to help drain urine out of the body.  The catheter is inserted into the part of the body that drains urine from the bladder (urethra).  Take  good care of your catheter to keep it working properly and help prevent problems from developing.  Always wash your hands before and after you handle your catheter or drainage bag.  Never pull on your catheter or try to remove it. This information is not intended to replace advice given to you by your health care provider. Make sure you discuss any questions you have with your health care provider. Document Released: 10/28/2005 Document Revised: 02/19/2019 Document Reviewed: 06/13/2017 Elsevier Patient Education  2020 [place]   1) The drugs that you were given will stay in your system until tomorrow so for the next 24 hours you should not:  A) Drive an automobile B) Make any legal decisions C) Drink any alcoholic beverage   2) You may resume regular meals tomorrow.  Today it is better to start with liquids and gradually work up to solid foods.  You may eat anything you prefer, but it is better to start with liquids, then soup and crackers, and gradually work up to solid foods.   3) Please notify your doctor immediately if you have any unusual bleeding, trouble breathing, redness and pain at the surgery site, drainage, fever, or pain not relieved by medication.    4) Additional Instructions:        Please contact your physician with any problems or Same Day Surgery at [phone], Monday through Friday  6 am to 4 pm, or [place] at [facility] number at [phone].

## 2019-08-12 NOTE — H&P (Signed)
Date of Initial H&P: 08/04/19  History reviewed, patient examined, no change in status, stable for surgery.

## 2019-08-12 NOTE — Anesthesia Post-op Follow-up Note (Signed)
Anesthesia QCDR form completed.        

## 2019-08-12 NOTE — Anesthesia Procedure Notes (Signed)
Procedure Name: LMA Insertion Date/Time: 08/12/2019 7:45 AM Performed by: Eben Burow, CRNA Pre-anesthesia Checklist: Patient identified, Emergency Drugs available, Suction available and Patient being monitored Patient Re-evaluated:Patient Re-evaluated prior to induction Oxygen Delivery Method: Circle system utilized Preoxygenation: Pre-oxygenation with 100% oxygen Induction Type: IV induction Ventilation: Mask ventilation without difficulty LMA: LMA inserted LMA Size: 4.5 Number of attempts: 1 Placement Confirmation: positive ETCO2 Tube secured with: Tape Dental Injury: Teeth and Oropharynx as per pre-operative assessment

## 2019-08-12 NOTE — Anesthesia Preprocedure Evaluation (Signed)
Anesthesia Evaluation  Patient identified by MRN, date of birth, ID band Patient awake    Reviewed: Allergy & Precautions, H&P , NPO status , reviewed documented beta blocker date and time   Airway Mallampati: II  TM Distance: >3 FB     Dental  (+) Caps   Pulmonary former smoker,    Pulmonary exam normal        Cardiovascular hypertension, + CAD, + Past MI and + Peripheral Vascular Disease  Normal cardiovascular exam     Neuro/Psych    GI/Hepatic GERD  Controlled,  Endo/Other  diabetes, Well Controlled, Insulin Dependent  Renal/GU      Musculoskeletal  (+) Arthritis ,   Abdominal   Peds  Hematology  (+) Blood dyscrasia, anemia ,   Anesthesia Other Findings Past Medical History: No date: Anemia No date: Arthritis     Comment:  right knee No date: Cancer [facility])     Comment:  BLADDER CANCER (2018)skin cancer No date: Coronary artery disease No date: Diabetes mellitus No date: Edema No date: GERD (gastroesophageal reflux disease)     Comment:  rare-NO MEDS 2007: Heart attack ([place]) No date: Hyperlipidemia No date: Hypertension No date: Hypoglycemia     Comment:  history of episodes x2 No date: Ischemic cardiomyopathy No date: Myocardial infarction ([place]) No date: Peripheral vascular disease ([name])     Comment:  swelling in Lt ankle due to prior injury Past Surgical History: No date: CARDIAC CATHETERIZATION 02/18/2018: COLONOSCOPY WITH PROPOFOL; N/A     Comment:  Procedure: COLONOSCOPY WITH PROPOFOL;  Surgeon: [place],               Benay Pike, MD;  Location: [facility] ENDOSCOPY;  Service:               Gastroenterology;  Laterality: N/A; No date: CORONARY ANGIOPLASTY No date: CORONARY STENT PLACEMENT     Comment:  To an occluded LAD x2 10/21/2017: CYSTOSCOPY WITH BIOPSY; N/A     Comment:  Procedure: CYSTOSCOPY WITH BLADDER BIOPSY;  Surgeon:               Royston Cowper, MD;  Location: [facility] ORS;  Service:           Urology;  Laterality: N/A; 12/08/2018: CYSTOSCOPY WITH BIOPSY; N/A     Comment:  Procedure: CYSTOSCOPY WITH BLADDER BIOPSY-MITOMYCIN;                Surgeon: Royston Cowper, MD;  Location: [facility] ORS;                Service: Urology;  Laterality: N/A; 06/05/2016: EXCISION MASS LOWER EXTREMETIES; Right     Comment:  Procedure: EXCISION OF LESION RIGHT LOWER LEG;  Surgeon:              Hubbard Robinson, MD;  Location: [facility] ORS;  Service:               General;  Laterality: Right; No date: SKIN CANCER EXCISION     Comment:  chest 07/15/2017: TRANSURETHRAL RESECTION OF BLADDER TUMOR; N/A     Comment:  Procedure: TRANSURETHRAL RESECTION OF BLADDER TUMOR               (TURBT);  Surgeon: Royston Cowper, MD;  Location: [facility]               ORS;  Service: Urology;  Laterality: N/A; BMI    Body Mass Index: 25.99 kg/m     Reproductive/Obstetrics  Anesthesia Physical Anesthesia Plan  ASA: III  Anesthesia Plan: General   Post-op Pain Management:    Induction: Intravenous  PONV Risk Score and Plan: 2 and Ondansetron and Treatment may vary due to age or medical condition  Airway Management Planned: LMA  Additional Equipment:   Intra-op Plan:   Post-operative Plan: Extubation in OR  Informed Consent: I have reviewed the patients History and Physical, chart, labs and discussed the procedure including the risks, benefits and alternatives for the proposed anesthesia with the patient or authorized representative who has indicated his/her understanding and acceptance.     Dental Advisory Given  Plan Discussed with: CRNA  Anesthesia Plan Comments:         Anesthesia Quick Evaluation

## 2019-08-13 LAB — SURGICAL PATHOLOGY

## 2019-08-19 ENCOUNTER — Other Ambulatory Visit: Payer: Self-pay | Admitting: Cardiovascular Disease

## 2019-08-19 NOTE — Anesthesia Postprocedure Evaluation (Signed)
Anesthesia Post Note  Patient: [name]  Procedure(s) Performed: GREEN LIGHT LASER TURP (TRANSURETHRAL RESECTION OF PROSTATE, BLADDER BIOPSY (N/A ) URETERAL BIOPSY (N/A )  Patient location during evaluation: PACU Anesthesia Type: General Level of consciousness: awake and alert Pain management: pain level controlled Vital Signs Assessment: post-procedure vital signs reviewed and stable Respiratory status: spontaneous breathing, nonlabored ventilation and respiratory function stable Cardiovascular status: blood pressure returned to baseline and stable Postop Assessment: no apparent nausea or vomiting Anesthetic complications: no     Last Vitals:  Vitals:   08/12/19 1013 08/12/19 1105  BP: 116/61 111/61  Pulse: 72 68  Resp: 18 18  Temp: (!) 36.3 C   SpO2: 95% 96%    Last Pain:  Vitals:   08/12/19 1105  TempSrc:   PainSc: 2                  Alphonsus [name]

## 2019-08-23 DIAGNOSIS — E103312 Type 1 diabetes mellitus with moderate nonproliferative diabetic retinopathy with macular edema, left eye: Secondary | ICD-10-CM | POA: Diagnosis not present

## 2019-08-30 DIAGNOSIS — N401 Enlarged prostate with lower urinary tract symptoms: Secondary | ICD-10-CM | POA: Diagnosis not present

## 2019-08-30 DIAGNOSIS — R3129 Other microscopic hematuria: Secondary | ICD-10-CM | POA: Diagnosis not present

## 2019-08-30 DIAGNOSIS — Z8551 Personal history of malignant neoplasm of bladder: Secondary | ICD-10-CM | POA: Diagnosis not present

## 2019-08-31 DIAGNOSIS — E1059 Type 1 diabetes mellitus with other circulatory complications: Secondary | ICD-10-CM | POA: Diagnosis not present

## 2019-09-07 DIAGNOSIS — E1042 Type 1 diabetes mellitus with diabetic polyneuropathy: Secondary | ICD-10-CM | POA: Diagnosis not present

## 2019-09-07 DIAGNOSIS — E1029 Type 1 diabetes mellitus with other diabetic kidney complication: Secondary | ICD-10-CM | POA: Diagnosis not present

## 2019-09-07 DIAGNOSIS — Z9641 Presence of insulin pump (external) (internal): Secondary | ICD-10-CM | POA: Diagnosis not present

## 2019-09-07 DIAGNOSIS — E1059 Type 1 diabetes mellitus with other circulatory complications: Secondary | ICD-10-CM | POA: Diagnosis not present

## 2019-09-07 DIAGNOSIS — E103212 Type 1 diabetes mellitus with mild nonproliferative diabetic retinopathy with macular edema, left eye: Secondary | ICD-10-CM | POA: Diagnosis not present

## 2019-09-07 DIAGNOSIS — R809 Proteinuria, unspecified: Secondary | ICD-10-CM | POA: Diagnosis not present

## 2019-09-11 DIAGNOSIS — E109 Type 1 diabetes mellitus without complications: Secondary | ICD-10-CM | POA: Diagnosis not present

## 2019-09-13 DIAGNOSIS — E109 Type 1 diabetes mellitus without complications: Secondary | ICD-10-CM | POA: Diagnosis not present

## 2019-09-14 DIAGNOSIS — E109 Type 1 diabetes mellitus without complications: Secondary | ICD-10-CM | POA: Diagnosis not present

## 2019-09-22 DIAGNOSIS — N390 Urinary tract infection, site not specified: Secondary | ICD-10-CM | POA: Diagnosis not present

## 2019-09-22 DIAGNOSIS — Z8551 Personal history of malignant neoplasm of bladder: Secondary | ICD-10-CM | POA: Diagnosis not present

## 2019-09-22 DIAGNOSIS — N401 Enlarged prostate with lower urinary tract symptoms: Secondary | ICD-10-CM | POA: Diagnosis not present

## 2019-09-22 DIAGNOSIS — R300 Dysuria: Secondary | ICD-10-CM | POA: Diagnosis not present

## 2019-09-27 ENCOUNTER — Other Ambulatory Visit: Payer: Self-pay

## 2019-09-27 DIAGNOSIS — E103312 Type 1 diabetes mellitus with moderate nonproliferative diabetic retinopathy with macular edema, left eye: Secondary | ICD-10-CM | POA: Diagnosis not present

## 2019-09-27 MED ORDER — CARVEDILOL 12.5 MG PO TABS: 12.5000 mg | ORAL_TABLET | Freq: Two times a day (BID) | ORAL | 3 refills | Status: DC

## 2019-09-28 ENCOUNTER — Telehealth: Payer: Self-pay | Admitting: Cardiovascular Disease

## 2019-09-28 NOTE — Telephone Encounter (Signed)
Patient had recent UTI and says he was told prolonged uti can effect clotting and cause CVA.  Please call to advise

## 2019-09-29 NOTE — Telephone Encounter (Signed)
Routing to Dr Rockey Situ to advise.

## 2019-09-30 NOTE — Telephone Encounter (Signed)
I am unaware of any data about urinary tract infection and risk of stroke

## 2019-10-01 NOTE — Telephone Encounter (Signed)
Call to patient to make him aware of note from Dr. Rockey Situ. He verbalized understanding and said that he was placed on a different abx after culture came back and is now feeling much better.   No further orders or questions at this time.   Advised pt to call for any further questions or concerns.

## 2019-10-05 DIAGNOSIS — R300 Dysuria: Secondary | ICD-10-CM | POA: Diagnosis not present

## 2019-10-05 DIAGNOSIS — R310 Gross hematuria: Secondary | ICD-10-CM | POA: Diagnosis not present

## 2019-10-05 DIAGNOSIS — Z8551 Personal history of malignant neoplasm of bladder: Secondary | ICD-10-CM | POA: Diagnosis not present

## 2019-10-15 DIAGNOSIS — Z87891 Personal history of nicotine dependence: Secondary | ICD-10-CM | POA: Diagnosis not present

## 2019-10-15 DIAGNOSIS — S90421S Blister (nonthermal), right great toe, sequela: Secondary | ICD-10-CM | POA: Diagnosis not present

## 2019-10-15 DIAGNOSIS — B351 Tinea unguium: Secondary | ICD-10-CM | POA: Diagnosis not present

## 2019-11-02 DIAGNOSIS — E103312 Type 1 diabetes mellitus with moderate nonproliferative diabetic retinopathy with macular edema, left eye: Secondary | ICD-10-CM | POA: Diagnosis not present

## 2019-11-30 DIAGNOSIS — R310 Gross hematuria: Secondary | ICD-10-CM | POA: Diagnosis not present

## 2019-11-30 DIAGNOSIS — R311 Benign essential microscopic hematuria: Secondary | ICD-10-CM | POA: Diagnosis not present

## 2019-11-30 DIAGNOSIS — Z8551 Personal history of malignant neoplasm of bladder: Secondary | ICD-10-CM | POA: Diagnosis not present

## 2019-11-30 DIAGNOSIS — N401 Enlarged prostate with lower urinary tract symptoms: Secondary | ICD-10-CM | POA: Diagnosis not present

## 2019-11-30 DIAGNOSIS — R350 Frequency of micturition: Secondary | ICD-10-CM | POA: Diagnosis not present

## 2019-11-30 DIAGNOSIS — N35919 Unspecified urethral stricture, male, unspecified site: Secondary | ICD-10-CM | POA: Diagnosis not present

## 2019-11-30 DIAGNOSIS — R3129 Other microscopic hematuria: Secondary | ICD-10-CM | POA: Diagnosis not present

## 2019-11-30 DIAGNOSIS — R351 Nocturia: Secondary | ICD-10-CM | POA: Diagnosis not present

## 2019-12-09 ENCOUNTER — Ambulatory Visit: Payer: Self-pay

## 2019-12-14 DIAGNOSIS — E103312 Type 1 diabetes mellitus with moderate nonproliferative diabetic retinopathy with macular edema, left eye: Secondary | ICD-10-CM | POA: Diagnosis not present

## 2019-12-18 ENCOUNTER — Ambulatory Visit: Payer: Medicare HMO | Attending: Internal Medicine

## 2019-12-18 DIAGNOSIS — Z23 Encounter for immunization: Secondary | ICD-10-CM | POA: Insufficient documentation

## 2019-12-18 NOTE — Progress Notes (Signed)
   Covid-19 Vaccination Clinic  Name:  [name]    MRN: [id number] DOB: 1946/10/16  12/18/2019  [name] was observed post Covid-19 immunization for 15 minutes without incidence. He was provided with Vaccine Information Sheet and instruction to access the V-Safe system.   [name] was instructed to call 911 with any severe reactions post vaccine: Marland Kitchen Difficulty breathing  . Swelling of your face and throat  . A fast heartbeat  . A bad rash all over your body  . Dizziness and weakness    Immunizations Administered    Name Date Dose VIS Date Route   Pfizer COVID-19 Vaccine 12/18/2019  8:48 AM 0.3 mL 10/22/2019 Intramuscular   Manufacturer: [place]   Lot: [id number]   [place]: [id number]

## 2019-12-24 DIAGNOSIS — E109 Type 1 diabetes mellitus without complications: Secondary | ICD-10-CM | POA: Diagnosis not present

## 2019-12-29 DIAGNOSIS — E109 Type 1 diabetes mellitus without complications: Secondary | ICD-10-CM | POA: Diagnosis not present

## 2019-12-30 ENCOUNTER — Ambulatory Visit: Payer: Self-pay

## 2020-01-04 DIAGNOSIS — E1042 Type 1 diabetes mellitus with diabetic polyneuropathy: Secondary | ICD-10-CM | POA: Diagnosis not present

## 2020-01-05 DIAGNOSIS — E1059 Type 1 diabetes mellitus with other circulatory complications: Secondary | ICD-10-CM | POA: Diagnosis not present

## 2020-01-05 DIAGNOSIS — B351 Tinea unguium: Secondary | ICD-10-CM | POA: Diagnosis not present

## 2020-01-05 DIAGNOSIS — L97522 Non-pressure chronic ulcer of other part of left foot with fat layer exposed: Secondary | ICD-10-CM | POA: Diagnosis not present

## 2020-01-11 ENCOUNTER — Ambulatory Visit: Payer: Self-pay

## 2020-01-11 ENCOUNTER — Ambulatory Visit: Payer: Medicare HMO | Attending: Internal Medicine

## 2020-01-11 DIAGNOSIS — E10621 Type 1 diabetes mellitus with foot ulcer: Secondary | ICD-10-CM | POA: Diagnosis not present

## 2020-01-11 DIAGNOSIS — E1042 Type 1 diabetes mellitus with diabetic polyneuropathy: Secondary | ICD-10-CM | POA: Diagnosis not present

## 2020-01-11 DIAGNOSIS — E1059 Type 1 diabetes mellitus with other circulatory complications: Secondary | ICD-10-CM | POA: Diagnosis not present

## 2020-01-11 DIAGNOSIS — E1029 Type 1 diabetes mellitus with other diabetic kidney complication: Secondary | ICD-10-CM | POA: Diagnosis not present

## 2020-01-11 DIAGNOSIS — R809 Proteinuria, unspecified: Secondary | ICD-10-CM | POA: Diagnosis not present

## 2020-01-11 DIAGNOSIS — E785 Hyperlipidemia, unspecified: Secondary | ICD-10-CM | POA: Diagnosis not present

## 2020-01-11 DIAGNOSIS — Z23 Encounter for immunization: Secondary | ICD-10-CM | POA: Insufficient documentation

## 2020-01-11 DIAGNOSIS — E1069 Type 1 diabetes mellitus with other specified complication: Secondary | ICD-10-CM | POA: Diagnosis not present

## 2020-01-11 DIAGNOSIS — E103212 Type 1 diabetes mellitus with mild nonproliferative diabetic retinopathy with macular edema, left eye: Secondary | ICD-10-CM | POA: Diagnosis not present

## 2020-01-11 DIAGNOSIS — Z9641 Presence of insulin pump (external) (internal): Secondary | ICD-10-CM | POA: Diagnosis not present

## 2020-01-11 NOTE — Progress Notes (Signed)
   Covid-19 Vaccination Clinic  Name:  KAIKOA CHEA    MRN: [id number] DOB: 07-16-1946  01/11/2020  Mr. Archey was observed post Covid-19 immunization for 15 minutes without incident. He was provided with Vaccine Information Sheet and instruction to access the V-Safe system.   Mr. Heaster was instructed to call 911 with any severe reactions post vaccine: Marland Kitchen Difficulty breathing  . Swelling of face and throat  . A fast heartbeat  . A bad rash all over body  . Dizziness and weakness   Immunizations Administered    Name Date Dose VIS Date Route   Pfizer COVID-19 Vaccine 01/11/2020  1:35 PM 0.3 mL 10/22/2019 Intramuscular   Manufacturer: [place]   Lot: [id number]   [place]: [id number]

## 2020-01-26 DIAGNOSIS — L97521 Non-pressure chronic ulcer of other part of left foot limited to breakdown of skin: Secondary | ICD-10-CM | POA: Diagnosis not present

## 2020-02-08 DIAGNOSIS — E103312 Type 1 diabetes mellitus with moderate nonproliferative diabetic retinopathy with macular edema, left eye: Secondary | ICD-10-CM | POA: Diagnosis not present

## 2020-02-28 DIAGNOSIS — H2513 Age-related nuclear cataract, bilateral: Secondary | ICD-10-CM | POA: Diagnosis not present

## 2020-03-02 ENCOUNTER — Encounter: Payer: Self-pay | Admitting: Urology

## 2020-03-02 ENCOUNTER — Ambulatory Visit
Admission: RE | Admit: 2020-03-02 | Discharge: 2020-03-02 | Disposition: A | Discharge status: Home / Self Care | Payer: Medicare HMO | Source: Ambulatory Visit | Attending: Urology | Admitting: Urology

## 2020-03-02 ENCOUNTER — Ambulatory Visit: Payer: Medicare HMO

## 2020-03-02 ENCOUNTER — Other Ambulatory Visit: Payer: Self-pay

## 2020-03-02 ENCOUNTER — Encounter
Admission: RE | Disposition: A | Discharge status: Home / Self Care | Payer: Self-pay | Source: Ambulatory Visit | Attending: Urology

## 2020-03-02 ENCOUNTER — Ambulatory Visit: Payer: Medicare HMO | Admitting: Registered Nurse

## 2020-03-02 ENCOUNTER — Other Ambulatory Visit
Admission: RE | Admit: 2020-03-02 | Discharge: 2020-03-02 | Disposition: A | Discharge status: Home / Self Care | Payer: Medicare HMO | Source: Ambulatory Visit | Attending: Urology | Admitting: Urology

## 2020-03-02 DIAGNOSIS — N201 Calculus of ureter: Secondary | ICD-10-CM | POA: Diagnosis not present

## 2020-03-02 DIAGNOSIS — N35919 Unspecified urethral stricture, male, unspecified site: Secondary | ICD-10-CM | POA: Diagnosis not present

## 2020-03-02 DIAGNOSIS — Z8249 Family history of ischemic heart disease and other diseases of the circulatory system: Secondary | ICD-10-CM | POA: Diagnosis not present

## 2020-03-02 DIAGNOSIS — E119 Type 2 diabetes mellitus without complications: Secondary | ICD-10-CM | POA: Insufficient documentation

## 2020-03-02 DIAGNOSIS — Z7902 Long term (current) use of antithrombotics/antiplatelets: Secondary | ICD-10-CM | POA: Insufficient documentation

## 2020-03-02 DIAGNOSIS — N390 Urinary tract infection, site not specified: Secondary | ICD-10-CM | POA: Diagnosis not present

## 2020-03-02 DIAGNOSIS — Z8551 Personal history of malignant neoplasm of bladder: Secondary | ICD-10-CM | POA: Diagnosis not present

## 2020-03-02 DIAGNOSIS — I2510 Atherosclerotic heart disease of native coronary artery without angina pectoris: Secondary | ICD-10-CM | POA: Insufficient documentation

## 2020-03-02 DIAGNOSIS — Z7982 Long term (current) use of aspirin: Secondary | ICD-10-CM | POA: Insufficient documentation

## 2020-03-02 DIAGNOSIS — Z20822 Contact with and (suspected) exposure to covid-19: Secondary | ICD-10-CM | POA: Insufficient documentation

## 2020-03-02 DIAGNOSIS — I10 Essential (primary) hypertension: Secondary | ICD-10-CM | POA: Diagnosis not present

## 2020-03-02 DIAGNOSIS — Z955 Presence of coronary angioplasty implant and graft: Secondary | ICD-10-CM | POA: Diagnosis not present

## 2020-03-02 DIAGNOSIS — Z794 Long term (current) use of insulin: Secondary | ICD-10-CM | POA: Insufficient documentation

## 2020-03-02 DIAGNOSIS — Z79899 Other long term (current) drug therapy: Secondary | ICD-10-CM | POA: Diagnosis not present

## 2020-03-02 DIAGNOSIS — N211 Calculus in urethra: Secondary | ICD-10-CM | POA: Diagnosis not present

## 2020-03-02 DIAGNOSIS — E1051 Type 1 diabetes mellitus with diabetic peripheral angiopathy without gangrene: Secondary | ICD-10-CM | POA: Diagnosis not present

## 2020-03-02 DIAGNOSIS — N35014 Post-traumatic urethral stricture, male, unspecified: Secondary | ICD-10-CM

## 2020-03-02 DIAGNOSIS — N35011 Post-traumatic bulbous urethral stricture: Secondary | ICD-10-CM | POA: Diagnosis not present

## 2020-03-02 DIAGNOSIS — Z87891 Personal history of nicotine dependence: Secondary | ICD-10-CM | POA: Insufficient documentation

## 2020-03-02 DIAGNOSIS — D649 Anemia, unspecified: Secondary | ICD-10-CM | POA: Diagnosis not present

## 2020-03-02 DIAGNOSIS — K219 Gastro-esophageal reflux disease without esophagitis: Secondary | ICD-10-CM | POA: Diagnosis not present

## 2020-03-02 HISTORY — PX: CYSTOSCOPY WITH DIRECT VISION INTERNAL URETHROTOMY: SHX6637

## 2020-03-02 HISTORY — PX: HOLMIUM LASER APPLICATION: SHX5852

## 2020-03-02 LAB — GLUCOSE, CAPILLARY
Glucose-Capillary: 193 mg/dL — ABNORMAL HIGH (ref 70–99)
Glucose-Capillary: 171 mg/dL — ABNORMAL HIGH (ref 70–99)

## 2020-03-02 LAB — RESPIRATORY PANEL BY RT PCR (FLU A&B, COVID)
Influenza A by PCR: NEGATIVE
Influenza B by PCR: NEGATIVE
SARS Coronavirus 2 by RT PCR: NEGATIVE

## 2020-03-02 SURGERY — CYSTOSCOPY, WITH DIRECT VISION INTERNAL URETHROTOMY
Anesthesia: General

## 2020-03-02 MED ORDER — FENTANYL CITRATE (PF) 100 MCG/2ML IJ SOLN
INTRAMUSCULAR | Status: DC | PRN
  Administered 2020-03-02 (×2): 50 ug via INTRAVENOUS

## 2020-03-02 MED ORDER — OXYCODONE HCL 5 MG PO TABS: 5.0000 mg | ORAL_TABLET | Freq: Once | ORAL | Status: AC | PRN

## 2020-03-02 MED ORDER — CEFAZOLIN SODIUM-DEXTROSE 1-4 GM/50ML-% IV SOLN
INTRAVENOUS | Status: AC
  Filled 2020-03-02: qty 50

## 2020-03-02 MED ORDER — LIDOCAINE HCL URETHRAL/MUCOSAL 2 % EX GEL
CUTANEOUS | Status: DC | PRN
  Administered 2020-03-02: 1

## 2020-03-02 MED ORDER — LIDOCAINE HCL URETHRAL/MUCOSAL 2 % EX GEL
CUTANEOUS | Status: AC
  Filled 2020-03-02: qty 10

## 2020-03-02 MED ORDER — UROGESIC-BLUE 81.6 MG PO TABS: 1.0000 | ORAL_TABLET | Freq: Four times a day (QID) | ORAL | 3 refills | Status: DC | PRN

## 2020-03-02 MED ORDER — SUCCINYLCHOLINE CHLORIDE 20 MG/ML IJ SOLN
INTRAMUSCULAR | Status: DC | PRN
  Administered 2020-03-02: 120 mg via INTRAVENOUS

## 2020-03-02 MED ORDER — LIDOCAINE HCL (PF) 2 % IJ SOLN
INTRAMUSCULAR | Status: AC
  Filled 2020-03-02: qty 5

## 2020-03-02 MED ORDER — FENTANYL CITRATE (PF) 100 MCG/2ML IJ SOLN
25.0000 ug | INTRAMUSCULAR | Status: DC | PRN
  Administered 2020-03-02 (×3): 25 ug via INTRAVENOUS

## 2020-03-02 MED ORDER — PROPOFOL 10 MG/ML IV BOLUS
INTRAVENOUS | Status: DC | PRN
  Administered 2020-03-02: 20 mg via INTRAVENOUS
  Administered 2020-03-02: 30 mg via INTRAVENOUS
  Administered 2020-03-02: 150 mg via INTRAVENOUS

## 2020-03-02 MED ORDER — SODIUM CHLORIDE 0.9 % IV SOLN: INTRAVENOUS | Status: DC

## 2020-03-02 MED ORDER — PHENYLEPHRINE HCL (PRESSORS) 10 MG/ML IV SOLN
INTRAVENOUS | Status: DC | PRN
  Administered 2020-03-02: 200 ug via INTRAVENOUS
  Administered 2020-03-02: 50 ug via INTRAVENOUS
  Administered 2020-03-02: 100 ug via INTRAVENOUS

## 2020-03-02 MED ORDER — EPHEDRINE SULFATE 50 MG/ML IJ SOLN
INTRAMUSCULAR | Status: DC | PRN
  Administered 2020-03-02 (×2): 10 mg via INTRAVENOUS

## 2020-03-02 MED ORDER — BELLADONNA ALKALOIDS-OPIUM 16.2-60 MG RE SUPP
RECTAL | Status: AC
  Filled 2020-03-02: qty 1

## 2020-03-02 MED ORDER — PROPOFOL 10 MG/ML IV BOLUS
INTRAVENOUS | Status: AC
  Filled 2020-03-02: qty 20

## 2020-03-02 MED ORDER — DEXAMETHASONE SODIUM PHOSPHATE 10 MG/ML IJ SOLN
INTRAMUSCULAR | Status: AC
  Filled 2020-03-02: qty 1

## 2020-03-02 MED ORDER — CEFAZOLIN SODIUM-DEXTROSE 1-4 GM/50ML-% IV SOLN
1.0000 g | Freq: Once | INTRAVENOUS | Status: AC
  Administered 2020-03-02: 1 g via INTRAVENOUS

## 2020-03-02 MED ORDER — CIPROFLOXACIN HCL 500 MG PO TABS: 500.0000 mg | ORAL_TABLET | Freq: Two times a day (BID) | ORAL | 0 refills | Status: DC

## 2020-03-02 MED ORDER — OXYCODONE HCL 5 MG PO TABS
ORAL_TABLET | ORAL | Status: AC
  Administered 2020-03-02: 5 mg via ORAL
  Filled 2020-03-02: qty 1

## 2020-03-02 MED ORDER — EPHEDRINE 5 MG/ML INJ
INTRAVENOUS | Status: AC
  Filled 2020-03-02: qty 10

## 2020-03-02 MED ORDER — FENTANYL CITRATE (PF) 100 MCG/2ML IJ SOLN
INTRAMUSCULAR | Status: AC
  Administered 2020-03-02: 25 ug via INTRAVENOUS
  Filled 2020-03-02: qty 2

## 2020-03-02 MED ORDER — FENTANYL CITRATE (PF) 100 MCG/2ML IJ SOLN
INTRAMUSCULAR | Status: AC
  Filled 2020-03-02: qty 2

## 2020-03-02 MED ORDER — ONDANSETRON HCL 4 MG/2ML IJ SOLN
INTRAMUSCULAR | Status: AC
  Filled 2020-03-02: qty 2

## 2020-03-02 MED ORDER — OXYCODONE HCL 5 MG/5ML PO SOLN: 5.0000 mg | Freq: Once | ORAL | Status: AC | PRN

## 2020-03-02 MED ORDER — DEXAMETHASONE SODIUM PHOSPHATE 10 MG/ML IJ SOLN
INTRAMUSCULAR | Status: DC | PRN
  Administered 2020-03-02: 5 mg via INTRAVENOUS

## 2020-03-02 MED ORDER — LIDOCAINE HCL (CARDIAC) PF 100 MG/5ML IV SOSY
PREFILLED_SYRINGE | INTRAVENOUS | Status: DC | PRN
  Administered 2020-03-02: 100 mg via INTRAVENOUS

## 2020-03-02 MED ORDER — BELLADONNA ALKALOIDS-OPIUM 16.2-60 MG RE SUPP
RECTAL | Status: DC | PRN
  Administered 2020-03-02: 1 via RECTAL

## 2020-03-02 SURGICAL SUPPLY — 28 items
BAG DRAIN CYSTO-URO LG1000N (MISCELLANEOUS) ×3 IMPLANT
BAG URINE DRAIN 2000ML AR STRL (UROLOGICAL SUPPLIES) ×3 IMPLANT
BRUSH SCRUB EZ 1% IODOPHOR (MISCELLANEOUS) ×3 IMPLANT
CATH FOLEY 2W COUNCIL 20FR 5CC (CATHETERS) ×3 IMPLANT
CATH URETL 5X70 OPEN END (CATHETERS) IMPLANT
CNTNR SPEC 2.5X3XGRAD LEK (MISCELLANEOUS)
CONT SPEC 4OZ STER OR WHT (MISCELLANEOUS)
CONTAINER SPEC 2.5X3XGRAD LEK (MISCELLANEOUS) IMPLANT
FIBER LASER FLEXIVA 365 (UROLOGICAL SUPPLIES) IMPLANT
FIBER LASER FLEXIVA 550 (UROLOGICAL SUPPLIES) ×3 IMPLANT
GLIDEWIRE STR 0.035 150CM 3CM (WIRE) ×3 IMPLANT
GLOVE BIO SURGEON STRL SZ7.5 (GLOVE) ×3 IMPLANT
GOWN STRL REUS W/ TWL LRG LVL3 (GOWN DISPOSABLE) ×2 IMPLANT
GOWN STRL REUS W/ TWL XL LVL3 (GOWN DISPOSABLE) ×2 IMPLANT
GOWN STRL REUS W/TWL LRG LVL3 (GOWN DISPOSABLE) ×1
GOWN STRL REUS W/TWL XL LVL3 (GOWN DISPOSABLE) ×1
GUIDEWIRE STR ZIPWIRE 035X150 (MISCELLANEOUS) ×6 IMPLANT
HOLDER FOLEY CATH W/STRAP (MISCELLANEOUS) ×3 IMPLANT
KIT TURNOVER CYSTO (KITS) ×3 IMPLANT
PACK CYSTO AR (MISCELLANEOUS) ×3 IMPLANT
SET CYSTO W/LG BORE CLAMP LF (SET/KITS/TRAYS/PACK) ×3 IMPLANT
SOL .9 NS 3000ML IRR  AL (IV SOLUTION) ×1
SOL .9 NS 3000ML IRR UROMATIC (IV SOLUTION) ×2 IMPLANT
SURGILUBE 2OZ TUBE FLIPTOP (MISCELLANEOUS) IMPLANT
SYR 10ML LL (SYRINGE) IMPLANT
SYR TOOMEY IRRIG 70ML (MISCELLANEOUS) ×3
SYRINGE TOOMEY IRRIG 70ML (MISCELLANEOUS) ×2 IMPLANT
WATER STERILE IRR 1000ML POUR (IV SOLUTION) ×3 IMPLANT

## 2020-03-02 NOTE — Anesthesia Procedure Notes (Signed)
Procedure Name: Intubation Date/Time: 03/02/2020 2:29 PM Performed by: Doreen Salvage, CRNA Pre-anesthesia Checklist: Patient identified, Emergency Drugs available, Suction available and Patient being monitored Patient Re-evaluated:Patient Re-evaluated prior to induction Oxygen Delivery Method: Circle system utilized Preoxygenation: Pre-oxygenation with 100% oxygen Induction Type: IV induction, Cricoid Pressure applied and Rapid sequence Ventilation: Mask ventilation without difficulty Laryngoscope Size: Mac, McGraph and 4 Grade View: Grade I Tube type: Oral Tube size: 7.5 mm Number of attempts: 1 Airway Equipment and Method: Stylet Placement Confirmation: ETT inserted through vocal cords under direct vision,  positive ETCO2 and breath sounds checked- equal and bilateral Secured at: 22 cm Tube secured with: Tape Dental Injury: Teeth and Oropharynx as per pre-operative assessment

## 2020-03-02 NOTE — Discharge Instructions (Addendum)
AMBULATORY SURGERY  DISCHARGE INSTRUCTIONS   1) The drugs that you were given will stay in your system until tomorrow so for the next 24 hours you should not:  A) Drive an automobile B) Make any legal decisions C) Drink any alcoholic beverage   2) You may resume regular meals tomorrow.  Today it is better to start with liquids and gradually work up to solid foods.  You may eat anything you prefer, but it is better to start with liquids, then soup and crackers, and gradually work up to solid foods.   3) Please notify your doctor immediately if you have any unusual bleeding, trouble breathing, redness and pain at the surgery site, drainage, fever, or pain not relieved by medication.    4) Additional Instructions:        Please contact your physician with any problems or Same Day Surgery at [phone], Monday through Friday 6 am to 4 pm, or  at [facility] number at [phone].  Urethral Stricture  Urethral stricture is narrowing of the tube (urethra) that carries urine from the bladder out of the body. The urethra can become narrow due to scar tissue from an injury or infection. This can make it difficult to pass urine. In women, the urethra opens above the vaginal opening. In men, the urethra opens at the tip of the penis, and the urethra is much longer than it is in women. Because of the length of the male urethra, urethral stricture is much more common in men. This condition is treated with surgery. What are the causes? In both men and women, common causes of urethral stricture include:  Urinary tract infection (UTI).  Sexually transmitted infection (STI).  Use of a tube placed into the urethra to drain urine from the bladder (urinary catheter).  Urinary tract surgery. In men, common causes of urethral stricture include:  A severe injury to the pelvis.  Prostate surgery.  Injury to the penis. In many cases, the cause of urethral stricture is not  known. What increases the risk? You are more likely to develop this condition if you:  Are male. Men who have had prostate surgery are at risk of developing this condition.  Use a urinary catheter.  Have had urinary tract surgery. What are the signs or symptoms? The main symptom of this condition is difficulty passing urine. This may cause decreased urine flow, dribbling, or spraying of urine. Other symptom of this condition may include:  Frequent UTIs.  Blood in the urine.  Pain when urinating.  Swelling of the penis in men.  Inability to pass urine (urinary obstruction). How is this diagnosed? This condition may be diagnosed based on:  Your medical history and a physical exam.  Urine tests to check for infection or bleeding.  X-rays.  Ultrasound.  Retrograde urethrogram. This is a type of test in which dye is injected into the urethra and then an X-ray is taken.  Urethroscopy. This is when a thin tube with a light and camera on the end (urethroscope) is used to look at the urethra. How is this treated? This condition is treated with surgery. The type of surgery that you have depends on the severity of your condition. You may have:  Urethral dilation. In this procedure, the narrow part of the urethra is stretched open (dilated) with dilating instruments or a small balloon.  Urethrotomy. In this procedure, a urethroscope is placed into the urethra, and the narrow part of the urethra is cut open  with a surgical blade inserted through the urethroscope.  Open surgery. In this procedure, an incision is made in the urethra, the narrow part is removed, and the urethra is reconstructed. Follow these instructions at home:   Take over-the-counter and prescription medicines only as told by your health care provider.  If you were prescribed an antibiotic medicine, take it as told by your health care provider. Do not stop taking the antibiotic even if you start to feel  better.  Drink enough fluid to keep your urine pale yellow.  Keep all follow-up visits as told by your health care provider. This is important. Contact a health care provider if:  You have signs of a urinary tract infection, such as: ? Frequent urination or passing small amounts of urine frequently. ? Needing to urinate urgently. ? Pain or burning with urination. ? Urine that smells bad or unusual. ? Cloudy urine. ? Pain in the lower abdomen or back. ? Trouble urinating. ? Blood in the urine. ? Vomiting or being less hungry than normal. ? Diarrhea or abdominal pain. ? Vaginal discharge, if you are male.  Your symptoms are getting worse instead of better. Get help right away if:  You cannot pass urine.  You have a fever.  You have swelling, bruising, or discoloration of your genital area. This includes the penis, scrotum, and inner thighs for men, and the outer genital organs (vulva) and inner thighs for women.  You develop swelling in your legs.  You have difficulty breathing. Summary  Urethral stricture is narrowing of the tube (urethra) that carries urine from the bladder out of the body. The urethra can become narrow due to scar tissue from an injury or infection.  This condition can make it difficult to pass urine.  This condition is treated with surgery. The type of surgery that you have depends on the severity of your condition.  Contact a health care provider if your symptoms get worse or you have signs of a urinary tract infection. This information is not intended to replace advice given to you by your health care provider. Make sure you discuss any questions you have with your health care provider. Document Revised: 06/10/2018 Document Reviewed: 06/10/2018 Elsevier Patient Education  [place] After This sheet gives you information about how to care for yourself after your procedure. Your health care provider may also give you  more specific instructions. If you have problems or questions, contact your health care provider. What can I expect after the procedure? After the procedure, it is common to have:  Burning pain when urinating.  Pain or discomfort in your genital area.  A small amount of blood in your urine. Your health care provider will tell you how long you can expect to have blood in your urine.  Bloody urine leaking from around your catheter. Follow these instructions at home: Catheter and drainage bag   Follow instructions from your health care provider about how to care for your catheter and your drainage bag.  Do not take baths, swim, or use a hot tub until your catheter has been removed. You may take showers while your catheter is in place.  If you have to insert a catheter on your own (self-catheterization) after your catheter is removed, make sure you understand the procedure completely. Carefully follow instructions from your health care provider. Medicines  Take over-the-counter and prescription medicines only as told by your health care provider.  If you were prescribed an  antibiotic medicine, take it as told by your health care provider. Do not stop taking the antibiotic even if you start to feel better.  Ask your health care provider if the medicine prescribed to you: ? Requires you to avoid driving or using heavy machinery. ? Can cause constipation. You may need to take these actions to prevent or treat constipation:  Take over-the-counter or prescription medicines.  Eat foods that are high in fiber, such as beans, whole grains, and fresh fruits and vegetables.  Limit foods that are high in fat and processed sugars, such as fried or sweet foods. Activity   Do not drive for 24 hours if you were given a sedative during your procedure.  Take short walks several times a day during your recovery.  Do not lift anything that is heavier than 10 lb (4.5 kg), or the limit that you are  told, until your health care provider says that it is safe.  Return to your normal activities as told by your health care provider. Ask your health care provider what activities are safe for you.  Do not have sex until your health care provider says it is okay. General instructions  Drink enough fluid to keep your urine pale yellow.  If a bandage (dressing) was applied over the opening of your urethra, change the dressing as told by your health care provider. Make sure you: ? Wash your hands with soap and water before and after you change your dressing. If soap and water are not available, use hand sanitizer. ? Keep your dressing clean and dry.  Do not use any products that contain nicotine or tobacco, such as cigarettes, e-cigarettes, and chewing tobacco. These can delay healing after surgery. If you need help quitting, ask your health care provider.  Keep all follow-up visits as told by your health care provider. This is important. Contact a health care provider if you:  Have a fever or chills.  Have pain that gets worse or does not get better with medicine.  Have blood in your urine for longer than your health care provider told you to expect.  Are a male and have any of these problems: ? Trouble getting an erection. ? Pain when you have an erection. ? Blood in your semen.  Have any of these problems after your catheter is removed: ? Trouble urinating. ? A slow urine stream. ? Urinating less than usual. ? Several streams or "spray" when you urinate.  Have pain in your abdomen.  Have swelling in your genital area that does not go away. Get help right away if:  You have severe pain.  A lot of blood is leaking from around your catheter.  You have blood clots in your urine.  Your catheter stops draining urine.  You cannot urinate after your catheter is removed.  You have redness, warmth, or pain in your leg.  You have chest pain.  You have trouble  breathing. These symptoms may represent a serious problem that is an emergency. Do not wait to see if the symptoms will go away. Get medical help right away. Call your local emergency services (911 in the [place] Do not drive yourself to the hospital. Summary  After the procedure, it is common to have burning pain when urinating and a small amount of blood in your urine.  Follow instructions from your health care provider about how to care for your catheter and your drainage bag.  Take short walks several times a day during your  recovery.  If a bandage (dressing) was applied over the opening of your urethra, change the dressing as told by your health care provider.  Contact your health care provider if you have trouble urinating after your catheter is removed. This information is not intended to replace advice given to you by your health care provider. Make sure you discuss any questions you have with your health care provider. Document Revised: 05/03/2019 Document Reviewed: 05/03/2019 Elsevier Patient Education  [place].  Indwelling Urinary Catheter Insertion, Care After This sheet gives you information about how to care for yourself after your procedure. Your health care provider may also give you more specific instructions. If you have problems or questions, contact your health care provider. What can I expect after the procedure? After the procedure, it is common to have:  Slight discomfort around your urethra where the catheter enters your body. Follow these instructions at home:   Keep the drainage bag at or below the level of your bladder. Doing this ensures that urine can only drain out, not back into your body.  Secure the catheter tubing and drainage bag to your leg or thigh to keep it from moving.  Check the catheter tubing regularly to make sure there are no kinks or blockages.  Take showers daily to keep the catheter clean. Do not take a bath.  Do not pull on  your catheter or try to remove it.  Disconnect the tubing and drainage bag as little as possible.  Empty the drainage bag every 2-4 hours, or more often if needed. Do not let the bag get completely full.  Wash your hands with soap and water before and after touching the catheter, tubing, or drainage bag.  Do not let the drainage bag or catheter tubing touch the floor.  Drink enough fluids to keep your urine clear or pale yellow, or as told by your health care provider. Contact a health care provider if:  Urine stops flowing into the drainage bag.  You feel pain or pressure in the bladder area.  You have back pain.  Your catheter gets clogged.  Your catheter starts to leak.  Your urine looks cloudy.  Your drainage bag or tubing looks dirty.  You notice a bad smell when emptying your drainage bag. Get help right away if:  You have a fever or chills.  You have severe pain in your back or your lower abdomen.  You have warmth, redness, swelling, or pain in the urethra area.  You notice blood in your urine.  Your catheter gets pulled out. Summary  Do not pull on your catheter or try to remove it.  Keep the drainage bag at or below the level of your bladder, but do not let the drainage bag or catheter tubing touch the floor.  Wash your hands with soap and water before and after touching the catheter, tubing, or drainage bag.  Contact your health care provider if you have a fever, chills, or any other signs of infection. This information is not intended to replace advice given to you by your health care provider. Make sure you discuss any questions you have with your health care provider. Document Revised: 02/19/2019 Document Reviewed: 12/07/2016 Elsevier Patient Education  [place].

## 2020-03-02 NOTE — Op Note (Addendum)
Preoperative diagnosis: 1.  Urethral stricture (N35.011)                                             2.  Urethral  Calculus (N21.1) Postoperative diagnosis: Same  Procedure:   1.  Cystoscopy with visual internal urethrotomy (CPT 52276)                       2.  Holmium laser lithotripsy of urethral calculus (CPT  52310)  Surgeon: Otelia Limes. Yves Dill MD  Anesthesia: General  Indications:See the history and physical. After informed consent the above procedure(s) were requested     Technique and findings: After adequate general anesthesia had been obtained patient was placed into dorsal lithotomy position and the perineum was prepped and draped in the usual fashion.  68 French cystoscope was coupled to the camera and visually advanced into the bulbar urethra.  A stricture was encountered in this region and just proximal stricture a 6 mm stone was identified.  A 0.035 guidewire was then passed beyond the stone and curled into the bladder.  The cystoscope was removed and the visual urethrotome passed up to the stricture.  Stricture was incised at the 12 o'clock position and the stone was identified again.  550 m holmium laser fiber was passed through the scope and set at power of 0.5 and frequency of 10.  The stone was fully fragmented.  The bladder was then thoroughly inspected.  The patient's bladder was heavily trabeculated and a large diverticulum was present posteriorly.  No bladder mucosal tumors were identified.  Both ureteral orifices were identified and had clear drainage.  The scope was then removed and 10 cc of viscous Xylocaine instilled within the urethra.  A 20 [place] [employer] catheter was then passed over the guidewire and the guidewire was removed.  Catheter was irrigated until clear.  A B&O suppository was placed.  The procedure was then terminated and patient transferred to the recovery room in stable condition.

## 2020-03-02 NOTE — Anesthesia Postprocedure Evaluation (Signed)
Anesthesia Post Note  Patient: [name]  Procedure(s) Performed: CYSTOSCOPY WITH DIRECT VISION INTERNAL URETHROTOMY HOLMIUM LASER APPLICATION  Patient location during evaluation: PACU Anesthesia Type: General Level of consciousness: awake and alert Pain management: pain level controlled Vital Signs Assessment: post-procedure vital signs reviewed and stable Respiratory status: spontaneous breathing, nonlabored ventilation, respiratory function stable and patient connected to nasal cannula oxygen Cardiovascular status: blood pressure returned to baseline and stable Postop Assessment: no apparent nausea or vomiting Anesthetic complications: no     Last Vitals:  Vitals:   03/02/20 1545 03/02/20 1555  BP: 136/71   Pulse: 87 91  Resp: (!) 21 20  Temp:  (!) 36.3 C  SpO2: 96% 95%    Last Pain:  Vitals:   03/02/20 1600  TempSrc:   PainSc: 3                  [name] Charlsie Fleeger

## 2020-03-02 NOTE — Transfer of Care (Signed)
Immediate Anesthesia Transfer of Care Note  Patient: [name]  Procedure(s) Performed: Procedure(s) with comments: CYSTOSCOPY WITH DIRECT VISION INTERNAL URETHROTOMY HOLMIUM LASER APPLICATION - Urethral stone   Patient Location: PACU  Anesthesia Type:General  Level of Consciousness: sedated  Airway & Oxygen Therapy: Patient Spontanous Breathing and Patient connected to face mask oxygen  Post-op Assessment: Report given to RN and Post -op Vital signs reviewed and stable  Post vital signs: Reviewed and stable  Last Vitals:  Vitals:   03/02/20 1338 03/02/20 1513  BP: 123/76 132/66  Pulse: 87 84  Resp: 16 17  Temp: 36.7 C 36.6 C  SpO2: [id number]    Complications: No apparent anesthesia complications

## 2020-03-02 NOTE — H&P (Signed)
Date of Initial H&P: 03/02/20  History reviewed, patient examined, no change in status, stable for surgery.

## 2020-03-02 NOTE — Anesthesia Preprocedure Evaluation (Signed)
Anesthesia Evaluation  Patient identified by MRN, date of birth, ID band Patient awake    Reviewed: Allergy & Precautions, H&P , NPO status , Patient's Chart, lab work & pertinent test results, reviewed documented beta blocker date and time   History of Anesthesia Complications Negative for: history of anesthetic complications  Airway Mallampati: III  TM Distance: >3 FB Neck ROM: full    Dental  (+) Poor Dentition, Chipped, Caps   Pulmonary neg pulmonary ROS, neg shortness of breath, former smoker,    Pulmonary exam normal        Cardiovascular Exercise Tolerance: Good hypertension, (-) angina+ CAD, + Past MI and + Peripheral Vascular Disease  (-) DOE Normal cardiovascular exam     Neuro/Psych negative neurological ROS  negative psych ROS   GI/Hepatic Neg liver ROS, GERD  Controlled,  Endo/Other  diabetes, Well Controlled, Type 1, Insulin Dependent  Renal/GU      Musculoskeletal  (+) Arthritis ,   Abdominal   Peds  Hematology  (+) Blood dyscrasia, anemia ,   Anesthesia Other Findings Past Medical History: No date: Anemia No date: Arthritis     Comment:  right knee No date: Cancer [facility])     Comment:  BLADDER CANCER (2018)skin cancer No date: Coronary artery disease No date: Diabetes mellitus No date: Edema No date: GERD (gastroesophageal reflux disease)     Comment:  rare-NO MEDS 2007: Heart attack ([place]) No date: Hyperlipidemia No date: Hypertension No date: Hypoglycemia     Comment:  history of episodes x2 No date: Ischemic cardiomyopathy No date: Myocardial infarction ([place]) No date: Peripheral vascular disease ([name])     Comment:  swelling in Lt ankle due to prior injury Past Surgical History: No date: CARDIAC CATHETERIZATION 02/18/2018: COLONOSCOPY WITH PROPOFOL; N/A     Comment:  Procedure: COLONOSCOPY WITH PROPOFOL;  Surgeon: [place],               Benay Pike, MD;  Location: [facility] ENDOSCOPY;   Service:               Gastroenterology;  Laterality: N/A; No date: CORONARY ANGIOPLASTY No date: CORONARY STENT PLACEMENT     Comment:  To an occluded LAD x2 10/21/2017: CYSTOSCOPY WITH BIOPSY; N/A     Comment:  Procedure: CYSTOSCOPY WITH BLADDER BIOPSY;  Surgeon:               Royston Cowper, MD;  Location: [facility] ORS;  Service:               Urology;  Laterality: N/A; 12/08/2018: CYSTOSCOPY WITH BIOPSY; N/A     Comment:  Procedure: CYSTOSCOPY WITH BLADDER BIOPSY-MITOMYCIN;                Surgeon: Royston Cowper, MD;  Location: [facility] ORS;                Service: Urology;  Laterality: N/A; 06/05/2016: EXCISION MASS LOWER EXTREMETIES; Right     Comment:  Procedure: EXCISION OF LESION RIGHT LOWER LEG;  Surgeon:              Hubbard Robinson, MD;  Location: [facility] ORS;  Service:               General;  Laterality: Right; No date: SKIN CANCER EXCISION     Comment:  chest 07/15/2017: TRANSURETHRAL RESECTION OF BLADDER TUMOR; N/A     Comment:  Procedure: TRANSURETHRAL RESECTION OF BLADDER TUMOR               (  TURBT);  Surgeon: Royston Cowper, MD;  Location: [facility]               ORS;  Service: Urology;  Laterality: N/A; BMI    Body Mass Index: 25.99 kg/m     Reproductive/Obstetrics negative OB ROS                             Anesthesia Physical  Anesthesia Plan  ASA: III  Anesthesia Plan: Rapid Sequence, Cricoid Pressure and General ETT   Post-op Pain Management:    Induction: Intravenous  PONV Risk Score and Plan: 2 and Ondansetron and Treatment may vary due to age or medical condition  Airway Management Planned: Oral ETT  Additional Equipment:   Intra-op Plan:   Post-operative Plan: Extubation in OR  Informed Consent: I have reviewed the patients History and Physical, chart, labs and discussed the procedure including the risks, benefits and alternatives for the proposed anesthesia with the patient or authorized representative who has indicated  his/her understanding and acceptance.     Dental Advisory Given  Plan Discussed with: CRNA  Anesthesia Plan Comments: (Dr. Eliberto Ivory is declaring this case an emergency to proceed without waiting to meet NPO guidelines   Patient consented for risks of anesthesia including but not limited to:  - adverse reactions to medications - damage to eyes, teeth, lips or other oral mucosa - nerve damage due to positioning  - sore throat or hoarseness - Damage to heart, brain, nerves, lungs or loss of life  Patient voiced understanding.)        Anesthesia Quick Evaluation

## 2020-03-02 NOTE — Addendum Note (Signed)
Addendum  created 03/02/20 1624 by Doreen Salvage, CRNA   Charge Capture section accepted

## 2020-03-03 NOTE — H&P (Signed)
NAME: [name], [name] MEDICAL RECORD [id number] ACCOUNT [id number] DATE OF BIRTH:[birth year] FACILITY: [facility] LOCATION: [facility] PHYSICIAN:Laquanda Bick Farrel Conners, MD  HISTORY AND PHYSICAL  DATE OF ADMISSION:  03/02/2020  CHIEF COMPLAINT:  Difficulty voiding.  HISTORY OF PRESENT ILLNESS:  The patient is a 74 year old white male who presented to the office today with a 2-day history of difficulty voiding and spraying of his urinary stream.  He was found to have a 6 mm stone lodged in his proximal urethra.  He  comes in now for cystoscopy with stone retrieval and possibly holmium laser lithotripsy.  PAST MEDICAL HISTORY:  ALLERGIES:  No drug allergies.  CURRENT MEDICATIONS:  Plavix, losartan, Lasix, carvedilol, multivitamins, aspirin, tamsulosin and insulin.  PAST SURGICAL HISTORY: 1.  Coronary artery stent placed in 2006. 2.  Transurethral resection of bladder tumor 2018. 3.  Photovaporization of prostate with GreenLight laser 2020.  PAST AND CURRENT MEDICAL CONDITIONS: 1.  Coronary artery disease. 2.  Diabetes. 3.  Hypertension. 4.  History of bladder cancer.  REVIEW OF SYSTEMS:  The patient bruises easily due to Plavix and aspirin.  Denies chest pain, shortness of breath or stroke.  SOCIAL HISTORY:  The patient quit smoking in 1998 with a 15-pack-year history.  He denied alcohol use.  FAMILY HISTORY:  Father died of myocardial infarction at age 32.  Mother died of heart disease at age 47.  PHYSICAL EXAMINATION: VITAL SIGNS:  Height 5 feet 9, weight 176.   GENERAL:  Well-nourished, white male in no acute distress. HEENT:  Sclerae were clear.  Pupils are equal, round, reactive to light and accommodation.  Extraocular movements are intact. NECK:  No palpable cervical adenopathy. PULMONARY:  Lungs clear to auscultation. CARDIOVASCULAR:  Regular rhythm and rate. ABDOMEN:  Soft, nontender abdomen. GENITOURINARY:  Circumcised.  Testes were smooth, nontender, 20 mL size  each. RECTAL:  Deferred. NEUROMUSCULAR:  Alert and oriented x3.  IMPRESSION:   1.  Obstructing calculus of the proximal urethra. 2.  Urethral stricture disease.  PLAN:  Cystoscopy with stone retrieval and possible holmium laser lithotripsy and catheter placement.  CN/NUANCE  D:03/02/2020 T:03/02/2020 JOB:[id number]

## 2020-03-14 DIAGNOSIS — E109 Type 1 diabetes mellitus without complications: Secondary | ICD-10-CM | POA: Diagnosis not present

## 2020-03-15 DIAGNOSIS — N35011 Post-traumatic bulbous urethral stricture: Secondary | ICD-10-CM | POA: Diagnosis not present

## 2020-03-15 DIAGNOSIS — N323 Diverticulum of bladder: Secondary | ICD-10-CM | POA: Diagnosis not present

## 2020-03-15 DIAGNOSIS — Z8551 Personal history of malignant neoplasm of bladder: Secondary | ICD-10-CM | POA: Diagnosis not present

## 2020-03-15 DIAGNOSIS — N3281 Overactive bladder: Secondary | ICD-10-CM | POA: Diagnosis not present

## 2020-03-15 DIAGNOSIS — R300 Dysuria: Secondary | ICD-10-CM | POA: Diagnosis not present

## 2020-03-24 DIAGNOSIS — E109 Type 1 diabetes mellitus without complications: Secondary | ICD-10-CM | POA: Diagnosis not present

## 2020-04-04 DIAGNOSIS — E103312 Type 1 diabetes mellitus with moderate nonproliferative diabetic retinopathy with macular edema, left eye: Secondary | ICD-10-CM | POA: Diagnosis not present

## 2020-04-07 DIAGNOSIS — I2510 Atherosclerotic heart disease of native coronary artery without angina pectoris: Secondary | ICD-10-CM | POA: Diagnosis not present

## 2020-04-07 DIAGNOSIS — H2512 Age-related nuclear cataract, left eye: Secondary | ICD-10-CM | POA: Diagnosis not present

## 2020-04-11 DIAGNOSIS — H269 Unspecified cataract: Secondary | ICD-10-CM

## 2020-04-11 HISTORY — DX: Unspecified cataract: H26.9

## 2020-04-12 ENCOUNTER — Other Ambulatory Visit: Payer: Self-pay

## 2020-04-12 ENCOUNTER — Encounter: Payer: Self-pay | Admitting: Ophthalmology

## 2020-04-14 ENCOUNTER — Other Ambulatory Visit: Payer: Self-pay

## 2020-04-14 ENCOUNTER — Other Ambulatory Visit
Admission: RE | Admit: 2020-04-14 | Discharge: 2020-04-14 | Disposition: A | Discharge status: Home / Self Care | Payer: Medicare HMO | Source: Ambulatory Visit | Attending: Ophthalmology | Admitting: Ophthalmology

## 2020-04-14 DIAGNOSIS — Z01812 Encounter for preprocedural laboratory examination: Secondary | ICD-10-CM | POA: Diagnosis not present

## 2020-04-14 DIAGNOSIS — Z20822 Contact with and (suspected) exposure to covid-19: Secondary | ICD-10-CM | POA: Diagnosis not present

## 2020-04-14 LAB — SARS CORONAVIRUS 2 (TAT 6-24 HRS): SARS Coronavirus 2: NEGATIVE

## 2020-04-14 NOTE — Discharge Instructions (Signed)

## 2020-04-15 DIAGNOSIS — R300 Dysuria: Secondary | ICD-10-CM | POA: Diagnosis not present

## 2020-04-15 DIAGNOSIS — R509 Fever, unspecified: Secondary | ICD-10-CM | POA: Diagnosis not present

## 2020-04-18 ENCOUNTER — Ambulatory Visit: Payer: Medicare HMO | Admitting: Anesthesiology

## 2020-04-18 ENCOUNTER — Other Ambulatory Visit: Payer: Self-pay

## 2020-04-18 ENCOUNTER — Encounter: Payer: Self-pay | Admitting: Ophthalmology

## 2020-04-18 ENCOUNTER — Encounter
Admission: RE | Disposition: A | Discharge status: Home / Self Care | Payer: Self-pay | Source: Home / Self Care | Attending: Ophthalmology

## 2020-04-18 ENCOUNTER — Ambulatory Visit
Admission: RE | Admit: 2020-04-18 | Discharge: 2020-04-18 | Disposition: A | Discharge status: Home / Self Care | Payer: Medicare HMO | Attending: Ophthalmology | Admitting: Ophthalmology

## 2020-04-18 DIAGNOSIS — H25812 Combined forms of age-related cataract, left eye: Secondary | ICD-10-CM | POA: Diagnosis not present

## 2020-04-18 DIAGNOSIS — Z87891 Personal history of nicotine dependence: Secondary | ICD-10-CM | POA: Insufficient documentation

## 2020-04-18 DIAGNOSIS — N400 Enlarged prostate without lower urinary tract symptoms: Secondary | ICD-10-CM | POA: Diagnosis not present

## 2020-04-18 DIAGNOSIS — Z794 Long term (current) use of insulin: Secondary | ICD-10-CM | POA: Insufficient documentation

## 2020-04-18 DIAGNOSIS — I252 Old myocardial infarction: Secondary | ICD-10-CM | POA: Insufficient documentation

## 2020-04-18 DIAGNOSIS — Z7982 Long term (current) use of aspirin: Secondary | ICD-10-CM | POA: Insufficient documentation

## 2020-04-18 DIAGNOSIS — H2512 Age-related nuclear cataract, left eye: Secondary | ICD-10-CM | POA: Insufficient documentation

## 2020-04-18 DIAGNOSIS — E119 Type 2 diabetes mellitus without complications: Secondary | ICD-10-CM | POA: Diagnosis not present

## 2020-04-18 DIAGNOSIS — I10 Essential (primary) hypertension: Secondary | ICD-10-CM | POA: Diagnosis not present

## 2020-04-18 DIAGNOSIS — Z79899 Other long term (current) drug therapy: Secondary | ICD-10-CM | POA: Diagnosis not present

## 2020-04-18 DIAGNOSIS — Z955 Presence of coronary angioplasty implant and graft: Secondary | ICD-10-CM | POA: Insufficient documentation

## 2020-04-18 DIAGNOSIS — E7800 Pure hypercholesterolemia, unspecified: Secondary | ICD-10-CM | POA: Insufficient documentation

## 2020-04-18 HISTORY — PX: CATARACT EXTRACTION W/PHACO: SHX586

## 2020-04-18 LAB — GLUCOSE, CAPILLARY
Glucose-Capillary: 179 mg/dL — ABNORMAL HIGH (ref 70–99)
Glucose-Capillary: 168 mg/dL — ABNORMAL HIGH (ref 70–99)

## 2020-04-18 SURGERY — PHACOEMULSIFICATION, CATARACT, WITH IOL INSERTION
Anesthesia: Monitor Anesthesia Care | Site: Eye | Laterality: Left

## 2020-04-18 MED ORDER — NA CHONDROIT SULF-NA HYALURON 40-17 MG/ML IO SOLN
INTRAOCULAR | Status: DC | PRN
  Administered 2020-04-18: 1 mL via INTRAOCULAR

## 2020-04-18 MED ORDER — LACTATED RINGERS IV SOLN: 10.0000 mL/h | INTRAVENOUS | Status: DC

## 2020-04-18 MED ORDER — MOXIFLOXACIN HCL 0.5 % OP SOLN
OPHTHALMIC | Status: DC | PRN
  Administered 2020-04-18: 0.2 mL via OPHTHALMIC

## 2020-04-18 MED ORDER — EPINEPHRINE PF 1 MG/ML IJ SOLN
INTRAOCULAR | Status: DC | PRN
  Administered 2020-04-18: 115 mL via OPHTHALMIC

## 2020-04-18 MED ORDER — BRIMONIDINE TARTRATE-TIMOLOL 0.2-0.5 % OP SOLN
OPHTHALMIC | Status: DC | PRN
  Administered 2020-04-18: 1 [drp] via OPHTHALMIC

## 2020-04-18 MED ORDER — LIDOCAINE HCL (PF) 2 % IJ SOLN
INTRAOCULAR | Status: DC | PRN
  Administered 2020-04-18: 1 mL

## 2020-04-18 MED ORDER — MIDAZOLAM HCL 2 MG/2ML IJ SOLN
INTRAMUSCULAR | Status: DC | PRN
  Administered 2020-04-18: 2 mg via INTRAVENOUS

## 2020-04-18 MED ORDER — ARMC OPHTHALMIC DILATING DROPS
1.0000 "application " | OPHTHALMIC | Status: DC | PRN
  Administered 2020-04-18 (×3): 1 via OPHTHALMIC

## 2020-04-18 MED ORDER — FENTANYL CITRATE (PF) 100 MCG/2ML IJ SOLN
INTRAMUSCULAR | Status: DC | PRN
  Administered 2020-04-18 (×2): 50 ug via INTRAVENOUS

## 2020-04-18 MED ORDER — TETRACAINE HCL 0.5 % OP SOLN
1.0000 [drp] | OPHTHALMIC | Status: DC | PRN
  Administered 2020-04-18 (×3): 1 [drp] via OPHTHALMIC

## 2020-04-18 SURGICAL SUPPLY — 20 items
CANNULA ANT/CHMB 27G (MISCELLANEOUS) ×2 IMPLANT
CANNULA ANT/CHMB 27GA (MISCELLANEOUS) ×6 IMPLANT
GLOVE SURG LX 8.0 MICRO (GLOVE) ×2
GLOVE SURG LX STRL 8.0 MICRO (GLOVE) ×1 IMPLANT
GLOVE SURG TRIUMPH 8.0 PF LTX (GLOVE) ×3 IMPLANT
GOWN STRL REUS W/ TWL LRG LVL3 (GOWN DISPOSABLE) ×2 IMPLANT
GOWN STRL REUS W/TWL LRG LVL3 (GOWN DISPOSABLE) ×4
LENS IOL DIOP 22.0 (Intraocular Lens) ×3 IMPLANT
LENS IOL TECNIS MONO 22.0 (Intraocular Lens) IMPLANT
MARKER SKIN DUAL TIP RULER LAB (MISCELLANEOUS) ×3 IMPLANT
NDL FILTER BLUNT 18X1 1/2 (NEEDLE) ×1 IMPLANT
NEEDLE FILTER BLUNT 18X 1/2SAF (NEEDLE) ×2
NEEDLE FILTER BLUNT 18X1 1/2 (NEEDLE) ×1 IMPLANT
PACK EYE AFTER SURG (MISCELLANEOUS) ×3 IMPLANT
PACK OPTHALMIC (MISCELLANEOUS) ×3 IMPLANT
PACK PORFILIO (MISCELLANEOUS) ×3 IMPLANT
SYR 3ML LL SCALE MARK (SYRINGE) ×3 IMPLANT
SYR TB 1ML LUER SLIP (SYRINGE) ×3 IMPLANT
WATER STERILE IRR 250ML POUR (IV SOLUTION) ×3 IMPLANT
WIPE NON LINTING 3.25X3.25 (MISCELLANEOUS) ×3 IMPLANT

## 2020-04-18 NOTE — Anesthesia Postprocedure Evaluation (Signed)
Anesthesia Post Note  Patient: [name]  Procedure(s) Performed: CATARACT EXTRACTION PHACO AND INTRAOCULAR LENS PLACEMENT (IOC) LEFT DIABETIC 14.60  01:57.1 (Left Eye)     Patient location during evaluation: PACU Anesthesia Type: MAC Level of consciousness: awake and alert Pain management: pain level controlled Vital Signs Assessment: post-procedure vital signs reviewed and stable Respiratory status: spontaneous breathing Cardiovascular status: blood pressure returned to baseline Postop Assessment: no apparent nausea or vomiting, adequate PO intake and no headache Anesthetic complications: no    Adele Barthel Keinan Brouillet

## 2020-04-18 NOTE — Op Note (Signed)
PREOPERATIVE DIAGNOSIS:  Nuclear sclerotic cataract of the left eye.   POSTOPERATIVE DIAGNOSIS:  Nuclear sclerotic cataract of the left eye.   OPERATIVE PROCEDURE:@   SURGEON:  Birder Robson, MD.   ANESTHESIA:  Anesthesiologist: Page, Adele Barthel, MD CRNA: Jeannene Patella, CRNA  1.      Managed anesthesia care. 2.     0.83ml of Shugarcaine was instilled following the paracentesis   COMPLICATIONS:  None.   TECHNIQUE:   Stop and chop   DESCRIPTION OF PROCEDURE:  The patient was examined and consented in the preoperative holding area where the aforementioned topical anesthesia was applied to the left eye and then brought back to the Operating Room where the left eye was prepped and draped in the usual sterile ophthalmic fashion and a lid speculum was placed. A paracentesis was created with the side port blade and the anterior chamber was filled with viscoelastic. A near clear corneal incision was performed with the steel keratome. A continuous curvilinear capsulorrhexis was performed with a cystotome followed by the capsulorrhexis forceps. Hydrodissection and hydrodelineation were carried out with BSS on a blunt cannula. The lens was removed in a stop and chop  technique and the remaining cortical material was removed with the irrigation-aspiration handpiece. The capsular bag was inflated with viscoelastic and the Technis [id number] lens was placed in the capsular bag without complication. The remaining viscoelastic was removed from the eye with the irrigation-aspiration handpiece. The wounds were hydrated. The anterior chamber was flushed with BSS and the eye was inflated to physiologic pressure. 0.51ml Vigamox was placed in the anterior chamber. The wounds were found to be water tight. The eye was dressed with Combigan. The patient was given protective glasses to wear throughout the day and a shield with which to sleep tonight. The patient was also given drops with which to begin a drop regimen  today and will follow-up with me in one day. Implant Name Type Inv. Item Serial No. Manufacturer Lot No. LRB No. Used Action  LENS IOL DIOP 22.0 - [id number] Intraocular Lens LENS IOL DIOP 22.0 [id number] AMO  Left 1 Implanted    Procedure(s) with comments: CATARACT EXTRACTION PHACO AND INTRAOCULAR LENS PLACEMENT (IOC) LEFT DIABETIC 14.60  01:57.1 (Left) - Diabetic - insulin pump  Electronically signed: Birder Robson 04/18/2020 7:54 AM

## 2020-04-18 NOTE — Transfer of Care (Signed)
Immediate Anesthesia Transfer of Care Note  Patient: [name]  Procedure(s) Performed: CATARACT EXTRACTION PHACO AND INTRAOCULAR LENS PLACEMENT (IOC) LEFT DIABETIC 14.60  01:57.1 (Left Eye)  Patient Location: PACU  Anesthesia Type: MAC  Level of Consciousness: awake, alert  and patient cooperative  Airway and Oxygen Therapy: Patient Spontanous Breathing and Patient connected to supplemental oxygen  Post-op Assessment: Post-op Vital signs reviewed, Patient's Cardiovascular Status Stable, Respiratory Function Stable, Patent Airway and No signs of Nausea or vomiting  Post-op Vital Signs: Reviewed and stable  Complications: No apparent anesthesia complications

## 2020-04-18 NOTE — H&P (Signed)
All labs reviewed. Abnormal studies sent to patients PCP when indicated.  Previous H&P reviewed, patient examined, there are NO CHANGES.  [name] Anspach Porfilio6/8/20217:21 AM

## 2020-04-18 NOTE — Anesthesia Procedure Notes (Signed)
Procedure Name: MAC Date/Time: 04/18/2020 7:36 AM Performed by: Jeannene Patella, CRNA Pre-anesthesia Checklist: Patient identified, Emergency Drugs available, Suction available, Patient being monitored and Timeout performed Patient Re-evaluated:Patient Re-evaluated prior to induction Oxygen Delivery Method: Nasal cannula

## 2020-04-18 NOTE — Anesthesia Preprocedure Evaluation (Signed)
Anesthesia Evaluation  Patient identified by MRN, date of birth, ID band Patient awake    History of Anesthesia Complications Negative for: history of anesthetic complications  Airway Mallampati: I  TM Distance: >3 FB Neck ROM: Full    Dental no notable dental hx.    Pulmonary former smoker,    Pulmonary exam normal        Cardiovascular Exercise Tolerance: Good hypertension, Pt. on home beta blockers and Pt. on medications (-) angina+ Past MI (10 years ago, stents x2, no longer on plavix)  Normal cardiovascular exam     Neuro/Psych negative neurological ROS     GI/Hepatic   Endo/Other  diabetes, Well Controlled, Type 1, Insulin Dependent  Renal/GU    Hx bladder cancer    Musculoskeletal   Abdominal   Peds  Hematology   Anesthesia Other Findings   Reproductive/Obstetrics                            Anesthesia Physical Anesthesia Plan  ASA: II  Anesthesia Plan: MAC   Post-op Pain Management:    Induction: Intravenous  PONV Risk Score and Plan: 1 and Midazolam, TIVA and Treatment may vary due to age or medical condition  Airway Management Planned: Nasal Cannula and Natural Airway  Additional Equipment:   Intra-op Plan:   Post-operative Plan:   Informed Consent: I have reviewed the patients History and Physical, chart, labs and discussed the procedure including the risks, benefits and alternatives for the proposed anesthesia with the patient or authorized representative who has indicated his/her understanding and acceptance.       Plan Discussed with: CRNA  Anesthesia Plan Comments:         Anesthesia Quick Evaluation

## 2020-04-19 ENCOUNTER — Encounter: Payer: Self-pay | Admitting: *Deleted

## 2020-04-24 DIAGNOSIS — E1069 Type 1 diabetes mellitus with other specified complication: Secondary | ICD-10-CM | POA: Diagnosis not present

## 2020-04-24 DIAGNOSIS — R809 Proteinuria, unspecified: Secondary | ICD-10-CM | POA: Diagnosis not present

## 2020-04-24 DIAGNOSIS — E1042 Type 1 diabetes mellitus with diabetic polyneuropathy: Secondary | ICD-10-CM | POA: Diagnosis not present

## 2020-04-24 DIAGNOSIS — E1059 Type 1 diabetes mellitus with other circulatory complications: Secondary | ICD-10-CM | POA: Diagnosis not present

## 2020-04-24 DIAGNOSIS — E103212 Type 1 diabetes mellitus with mild nonproliferative diabetic retinopathy with macular edema, left eye: Secondary | ICD-10-CM | POA: Diagnosis not present

## 2020-04-24 DIAGNOSIS — E1029 Type 1 diabetes mellitus with other diabetic kidney complication: Secondary | ICD-10-CM | POA: Diagnosis not present

## 2020-04-24 DIAGNOSIS — Z9641 Presence of insulin pump (external) (internal): Secondary | ICD-10-CM | POA: Diagnosis not present

## 2020-04-24 DIAGNOSIS — E785 Hyperlipidemia, unspecified: Secondary | ICD-10-CM | POA: Diagnosis not present

## 2020-05-04 DIAGNOSIS — N200 Calculus of kidney: Secondary | ICD-10-CM | POA: Diagnosis not present

## 2020-05-04 DIAGNOSIS — N359 Urethral stricture, unspecified: Secondary | ICD-10-CM | POA: Diagnosis not present

## 2020-05-04 DIAGNOSIS — N35011 Post-traumatic bulbous urethral stricture: Secondary | ICD-10-CM | POA: Diagnosis not present

## 2020-05-04 DIAGNOSIS — N35919 Unspecified urethral stricture, male, unspecified site: Secondary | ICD-10-CM | POA: Diagnosis not present

## 2020-05-08 ENCOUNTER — Encounter
Admission: RE | Disposition: A | Discharge status: Home / Self Care | Payer: Self-pay | Source: Ambulatory Visit | Attending: Urology

## 2020-05-08 ENCOUNTER — Other Ambulatory Visit
Admission: RE | Admit: 2020-05-08 | Discharge: 2020-05-08 | Disposition: A | Discharge status: Home / Self Care | Payer: Medicare HMO | Source: Ambulatory Visit | Attending: Urology | Admitting: Urology

## 2020-05-08 ENCOUNTER — Encounter: Payer: Self-pay | Admitting: Urology

## 2020-05-08 ENCOUNTER — Ambulatory Visit
Admission: RE | Admit: 2020-05-08 | Discharge: 2020-05-08 | Disposition: A | Discharge status: Home / Self Care | Payer: Medicare HMO | Source: Ambulatory Visit | Attending: Urology | Admitting: Urology

## 2020-05-08 ENCOUNTER — Other Ambulatory Visit: Payer: Self-pay

## 2020-05-08 ENCOUNTER — Ambulatory Visit: Payer: Medicare HMO | Admitting: Registered Nurse

## 2020-05-08 DIAGNOSIS — I10 Essential (primary) hypertension: Secondary | ICD-10-CM | POA: Diagnosis not present

## 2020-05-08 DIAGNOSIS — Z79899 Other long term (current) drug therapy: Secondary | ICD-10-CM | POA: Diagnosis not present

## 2020-05-08 DIAGNOSIS — Z20822 Contact with and (suspected) exposure to covid-19: Secondary | ICD-10-CM | POA: Insufficient documentation

## 2020-05-08 DIAGNOSIS — N323 Diverticulum of bladder: Secondary | ICD-10-CM | POA: Insufficient documentation

## 2020-05-08 DIAGNOSIS — N35919 Unspecified urethral stricture, male, unspecified site: Secondary | ICD-10-CM | POA: Diagnosis not present

## 2020-05-08 DIAGNOSIS — Z87891 Personal history of nicotine dependence: Secondary | ICD-10-CM | POA: Insufficient documentation

## 2020-05-08 DIAGNOSIS — Z7982 Long term (current) use of aspirin: Secondary | ICD-10-CM | POA: Diagnosis not present

## 2020-05-08 DIAGNOSIS — E119 Type 2 diabetes mellitus without complications: Secondary | ICD-10-CM | POA: Insufficient documentation

## 2020-05-08 DIAGNOSIS — E1151 Type 2 diabetes mellitus with diabetic peripheral angiopathy without gangrene: Secondary | ICD-10-CM | POA: Diagnosis not present

## 2020-05-08 DIAGNOSIS — Z955 Presence of coronary angioplasty implant and graft: Secondary | ICD-10-CM | POA: Insufficient documentation

## 2020-05-08 DIAGNOSIS — Z9641 Presence of insulin pump (external) (internal): Secondary | ICD-10-CM | POA: Diagnosis not present

## 2020-05-08 DIAGNOSIS — N211 Calculus in urethra: Secondary | ICD-10-CM | POA: Insufficient documentation

## 2020-05-08 DIAGNOSIS — E785 Hyperlipidemia, unspecified: Secondary | ICD-10-CM | POA: Diagnosis not present

## 2020-05-08 DIAGNOSIS — I2510 Atherosclerotic heart disease of native coronary artery without angina pectoris: Secondary | ICD-10-CM | POA: Diagnosis not present

## 2020-05-08 DIAGNOSIS — Z8551 Personal history of malignant neoplasm of bladder: Secondary | ICD-10-CM | POA: Diagnosis not present

## 2020-05-08 DIAGNOSIS — R338 Other retention of urine: Secondary | ICD-10-CM

## 2020-05-08 DIAGNOSIS — Z794 Long term (current) use of insulin: Secondary | ICD-10-CM | POA: Insufficient documentation

## 2020-05-08 DIAGNOSIS — N35011 Post-traumatic bulbous urethral stricture: Secondary | ICD-10-CM | POA: Diagnosis not present

## 2020-05-08 HISTORY — PX: CYSTOSCOPY WITH HOLMIUM LASER LITHOTRIPSY: SHX6639

## 2020-05-08 HISTORY — PX: URETHROTOMY: SHX1083

## 2020-05-08 LAB — SARS CORONAVIRUS 2 BY RT PCR (HOSPITAL ORDER, PERFORMED IN ~~LOC~~ HOSPITAL LAB): SARS Coronavirus 2: NEGATIVE

## 2020-05-08 LAB — GLUCOSE, CAPILLARY
Glucose-Capillary: 157 mg/dL — ABNORMAL HIGH (ref 70–99)
Glucose-Capillary: 179 mg/dL — ABNORMAL HIGH (ref 70–99)

## 2020-05-08 SURGERY — CYSTOSCOPY, WITH HOLMIUM LASER LITHOTRIPSY
Anesthesia: General | Site: Urethra

## 2020-05-08 MED ORDER — BELLADONNA ALKALOIDS-OPIUM 16.2-60 MG RE SUPP
RECTAL | Status: AC
  Filled 2020-05-08: qty 1

## 2020-05-08 MED ORDER — ONDANSETRON HCL 4 MG/2ML IJ SOLN
INTRAMUSCULAR | Status: AC
  Filled 2020-05-08: qty 2

## 2020-05-08 MED ORDER — ROCURONIUM BROMIDE 100 MG/10ML IV SOLN
INTRAVENOUS | Status: DC | PRN
  Administered 2020-05-08: 50 mg via INTRAVENOUS

## 2020-05-08 MED ORDER — FENTANYL CITRATE (PF) 100 MCG/2ML IJ SOLN
25.0000 ug | INTRAMUSCULAR | Status: DC | PRN
  Administered 2020-05-08 (×3): 25 ug via INTRAVENOUS

## 2020-05-08 MED ORDER — CHLORHEXIDINE GLUCONATE 0.12 % MT SOLN: 15.0000 mL | Freq: Once | OROMUCOSAL | Status: AC

## 2020-05-08 MED ORDER — DEXAMETHASONE SODIUM PHOSPHATE 10 MG/ML IJ SOLN
INTRAMUSCULAR | Status: DC | PRN
  Administered 2020-05-08: 5 mg via INTRAVENOUS

## 2020-05-08 MED ORDER — SUGAMMADEX SODIUM 500 MG/5ML IV SOLN
INTRAVENOUS | Status: DC | PRN
  Administered 2020-05-08: 337.6 mg via INTRAVENOUS

## 2020-05-08 MED ORDER — LIDOCAINE HCL URETHRAL/MUCOSAL 2 % EX GEL
CUTANEOUS | Status: AC
  Filled 2020-05-08: qty 10

## 2020-05-08 MED ORDER — CEFAZOLIN SODIUM-DEXTROSE 2-4 GM/100ML-% IV SOLN
2.0000 g | Freq: Once | INTRAVENOUS | Status: AC
  Administered 2020-05-08: 2 g via INTRAVENOUS

## 2020-05-08 MED ORDER — CEFAZOLIN SODIUM-DEXTROSE 2-4 GM/100ML-% IV SOLN
INTRAVENOUS | Status: AC
  Filled 2020-05-08: qty 100

## 2020-05-08 MED ORDER — FENTANYL CITRATE (PF) 100 MCG/2ML IJ SOLN
INTRAMUSCULAR | Status: DC | PRN
  Administered 2020-05-08: 50 ug via INTRAVENOUS

## 2020-05-08 MED ORDER — PROPOFOL 10 MG/ML IV BOLUS
INTRAVENOUS | Status: DC | PRN
  Administered 2020-05-08: 130 mg via INTRAVENOUS

## 2020-05-08 MED ORDER — SODIUM CHLORIDE 0.9 % IV SOLN
INTRAVENOUS | Status: DC
  Administered 2020-05-08: 10 mL/h via INTRAVENOUS

## 2020-05-08 MED ORDER — LIDOCAINE HCL URETHRAL/MUCOSAL 2 % EX GEL
CUTANEOUS | Status: DC | PRN
  Administered 2020-05-08: 1 via URETHRAL

## 2020-05-08 MED ORDER — CIPROFLOXACIN HCL 500 MG PO TABS
500.0000 mg | ORAL_TABLET | Freq: Two times a day (BID) | ORAL | 0 refills | Status: DC
Start: 2020-05-08 — End: 2020-10-02

## 2020-05-08 MED ORDER — FENTANYL CITRATE (PF) 100 MCG/2ML IJ SOLN
INTRAMUSCULAR | Status: AC
  Administered 2020-05-08: 25 ug via INTRAVENOUS
  Filled 2020-05-08: qty 2

## 2020-05-08 MED ORDER — LIDOCAINE HCL (CARDIAC) PF 100 MG/5ML IV SOSY
PREFILLED_SYRINGE | INTRAVENOUS | Status: DC | PRN
  Administered 2020-05-08: 100 mg via INTRAVENOUS

## 2020-05-08 MED ORDER — ONDANSETRON HCL 4 MG/2ML IJ SOLN: 4.0000 mg | Freq: Once | INTRAMUSCULAR | Status: DC | PRN

## 2020-05-08 MED ORDER — DEXAMETHASONE SODIUM PHOSPHATE 10 MG/ML IJ SOLN
INTRAMUSCULAR | Status: AC
  Filled 2020-05-08: qty 1

## 2020-05-08 MED ORDER — SUGAMMADEX SODIUM 500 MG/5ML IV SOLN
INTRAVENOUS | Status: AC
  Filled 2020-05-08: qty 5

## 2020-05-08 MED ORDER — ORAL CARE MOUTH RINSE: 15.0000 mL | Freq: Once | OROMUCOSAL | Status: AC

## 2020-05-08 MED ORDER — PROPOFOL 10 MG/ML IV BOLUS
INTRAVENOUS | Status: AC
  Filled 2020-05-08: qty 20

## 2020-05-08 MED ORDER — FENTANYL CITRATE (PF) 100 MCG/2ML IJ SOLN
INTRAMUSCULAR | Status: AC
  Filled 2020-05-08: qty 2

## 2020-05-08 MED ORDER — ONDANSETRON HCL 4 MG/2ML IJ SOLN
INTRAMUSCULAR | Status: DC | PRN
  Administered 2020-05-08: 4 mg via INTRAVENOUS

## 2020-05-08 MED ORDER — CHLORHEXIDINE GLUCONATE 0.12 % MT SOLN
OROMUCOSAL | Status: AC
  Administered 2020-05-08: 15 mL via OROMUCOSAL
  Filled 2020-05-08: qty 15

## 2020-05-08 MED ORDER — BELLADONNA ALKALOIDS-OPIUM 16.2-60 MG RE SUPP
RECTAL | Status: DC | PRN
  Administered 2020-05-08: 1 via RECTAL

## 2020-05-08 SURGICAL SUPPLY — 20 items
BAG DRAIN CYSTO-URO LG1000N (MISCELLANEOUS) ×3 IMPLANT
CONRAY 43 FOR UROLOGY 50M (MISCELLANEOUS) IMPLANT
FIBER LASER FLEXIVA 550 (UROLOGICAL SUPPLIES) ×3 IMPLANT
GLOVE BIO SURGEON STRL SZ7 (GLOVE) ×6 IMPLANT
GLOVE BIO SURGEON STRL SZ7.5 (GLOVE) ×3 IMPLANT
GOWN STRL REUS W/ TWL LRG LVL4 (GOWN DISPOSABLE) ×1 IMPLANT
GOWN STRL REUS W/ TWL XL LVL3 (GOWN DISPOSABLE) ×1 IMPLANT
GOWN STRL REUS W/TWL LRG LVL4 (GOWN DISPOSABLE) ×2
GOWN STRL REUS W/TWL XL LVL3 (GOWN DISPOSABLE) ×2
GUIDEWIRE STR ZIPWIRE 035X150 (MISCELLANEOUS) ×3 IMPLANT
KIT TURNOVER CYSTO (KITS) ×3 IMPLANT
PACK CYSTO AR (MISCELLANEOUS) ×3 IMPLANT
SET CYSTO W/LG BORE CLAMP LF (SET/KITS/TRAYS/PACK) ×3 IMPLANT
SOL .9 NS 3000ML IRR  AL (IV SOLUTION) ×2
SOL .9 NS 3000ML IRR UROMATIC (IV SOLUTION) ×1 IMPLANT
SOL PREP PVP 2OZ (MISCELLANEOUS)
SOLUTION PREP PVP 2OZ (MISCELLANEOUS) IMPLANT
STENT URET 6FRX24 CONTOUR (STENTS) IMPLANT
STENT URET 6FRX26 CONTOUR (STENTS) IMPLANT
WATER STERILE IRR 1000ML POUR (IV SOLUTION) ×3 IMPLANT

## 2020-05-08 NOTE — Transfer of Care (Signed)
Immediate Anesthesia Transfer of Care Note  Patient: [name]  Procedure(s) Performed: Procedure(s): CYSTOSCOPY WITH HOLMIUM LASER LITHOTRIPSY (N/A) CYSTOSCOPY/URETHROTOMY (N/A)  Patient Location: PACU  Anesthesia Type:General  Level of Consciousness: sedated  Airway & Oxygen Therapy: Patient Spontanous Breathing and Patient connected to face mask oxygen  Post-op Assessment: Report given to RN and Post -op Vital signs reviewed and stable  Post vital signs: Reviewed and stable  Last Vitals:  Vitals:   05/08/20 1221  BP: (!) 142/73  Pulse: 65  Resp: 14  Temp: 36.5 C  SpO2: 660%    Complications: No apparent anesthesia complications

## 2020-05-08 NOTE — Anesthesia Preprocedure Evaluation (Signed)
Anesthesia Evaluation  Patient identified by MRN, date of birth, ID band Patient awake    Reviewed: Allergy & Precautions, H&P , NPO status , Patient's Chart, lab work & pertinent test results, reviewed documented beta blocker date and time   History of Anesthesia Complications Negative for: history of anesthetic complications  Airway Mallampati: III  TM Distance: >3 FB Neck ROM: full    Dental  (+) Poor Dentition, Chipped, Caps   Pulmonary neg pulmonary ROS, neg shortness of breath, former smoker,    Pulmonary exam normal        Cardiovascular Exercise Tolerance: Good hypertension, (-) angina+ CAD, + Past MI and + Peripheral Vascular Disease  (-) DOE Normal cardiovascular exam     Neuro/Psych negative neurological ROS  negative psych ROS   GI/Hepatic Neg liver ROS, GERD  Controlled,  Endo/Other  diabetes, Well Controlled, Type 1, Insulin Dependent  Renal/GU      Musculoskeletal  (+) Arthritis ,   Abdominal   Peds  Hematology  (+) Blood dyscrasia, anemia ,   Anesthesia Other Findings Past Medical History: No date: Anemia No date: Arthritis     Comment:  right knee No date: Cancer [facility])     Comment:  BLADDER CANCER (2018)skin cancer No date: Coronary artery disease No date: Diabetes mellitus No date: Edema No date: GERD (gastroesophageal reflux disease)     Comment:  rare-NO MEDS 2007: Heart attack ([place]) No date: Hyperlipidemia No date: Hypertension No date: Hypoglycemia     Comment:  history of episodes x2 No date: Ischemic cardiomyopathy No date: Myocardial infarction ([place]) No date: Peripheral vascular disease ([name])     Comment:  swelling in Lt ankle due to prior injury Past Surgical History: No date: CARDIAC CATHETERIZATION 02/18/2018: COLONOSCOPY WITH PROPOFOL; N/A     Comment:  Procedure: COLONOSCOPY WITH PROPOFOL;  Surgeon: [place],               Benay Pike, MD;  Location: [facility] ENDOSCOPY;   Service:               Gastroenterology;  Laterality: N/A; No date: CORONARY ANGIOPLASTY No date: CORONARY STENT PLACEMENT     Comment:  To an occluded LAD x2 10/21/2017: CYSTOSCOPY WITH BIOPSY; N/A     Comment:  Procedure: CYSTOSCOPY WITH BLADDER BIOPSY;  Surgeon:               Royston Cowper, MD;  Location: [facility] ORS;  Service:               Urology;  Laterality: N/A; 12/08/2018: CYSTOSCOPY WITH BIOPSY; N/A     Comment:  Procedure: CYSTOSCOPY WITH BLADDER BIOPSY-MITOMYCIN;                Surgeon: Royston Cowper, MD;  Location: [facility] ORS;                Service: Urology;  Laterality: N/A; 06/05/2016: EXCISION MASS LOWER EXTREMETIES; Right     Comment:  Procedure: EXCISION OF LESION RIGHT LOWER LEG;  Surgeon:              Hubbard Robinson, MD;  Location: [facility] ORS;  Service:               General;  Laterality: Right; No date: SKIN CANCER EXCISION     Comment:  chest 07/15/2017: TRANSURETHRAL RESECTION OF BLADDER TUMOR; N/A     Comment:  Procedure: TRANSURETHRAL RESECTION OF BLADDER TUMOR               (  TURBT);  Surgeon: Royston Cowper, MD;  Location: [facility]               ORS;  Service: Urology;  Laterality: N/A; BMI    Body Mass Index: 25.99 kg/m     Reproductive/Obstetrics negative OB ROS                             Anesthesia Physical  Anesthesia Plan  ASA: III  Anesthesia Plan: General ETT   Post-op Pain Management:    Induction: Intravenous  PONV Risk Score and Plan: 2 and Ondansetron, Treatment may vary due to age or medical condition and Dexamethasone  Airway Management Planned: Oral ETT  Additional Equipment:   Intra-op Plan:   Post-operative Plan: Extubation in OR  Informed Consent: I have reviewed the patients History and Physical, chart, labs and discussed the procedure including the risks, benefits and alternatives for the proposed anesthesia with the patient or authorized representative who has indicated his/her understanding and  acceptance.     Dental Advisory Given  Plan Discussed with: CRNA  Anesthesia Plan Comments: (Dr. Eliberto Ivory is declaring this case an emergency to proceed without waiting to meet NPO guidelines   Patient consented for risks of anesthesia including but not limited to:  - adverse reactions to medications - damage to eyes, teeth, lips or other oral mucosa - nerve damage due to positioning  - sore throat or hoarseness - Damage to heart, brain, nerves, lungs or loss of life  Patient voiced understanding.)        Anesthesia Quick Evaluation

## 2020-05-08 NOTE — H&P (Signed)
Date of Initial H&P: 05/08/20  History reviewed, patient examined, no change in status, stable for surgery.

## 2020-05-08 NOTE — Discharge Instructions (Addendum)
Indwelling Urinary Catheter Care, Adult An indwelling urinary catheter is a thin tube that is put into your bladder. The tube helps to drain pee (urine) out of your body. The tube goes in through your urethra. Your urethra is where pee comes out of your body. Your pee will come out through the catheter, then it will go into a bag (drainage bag). Take good care of your catheter so it will work well. How to wear your catheter and bag Supplies needed  Sticky tape (adhesive tape) or a leg strap.  Alcohol wipe or soap and water (if you use tape).  A clean towel (if you use tape).  Large overnight bag.  Smaller bag (leg bag). Wearing your catheter Attach your catheter to your leg with tape or a leg strap.  Make sure the catheter is not pulled tight.  If a leg strap gets wet, take it off and put on a dry strap.  If you use tape to hold the bag on your leg: 1. Use an alcohol wipe or soap and water to wash your skin where the tape made it sticky before. 2. Use a clean towel to pat-dry that skin. 3. Use new tape to make the bag stay on your leg. Wearing your bags You should have been given a large overnight bag.  You may wear the overnight bag in the day or night.  Always have the overnight bag lower than your bladder.  Do not let the bag touch the floor.  Before you go to sleep, put a clean plastic bag in a wastebasket. Then hang the overnight bag inside the wastebasket. You should also have a smaller leg bag that fits under your clothes.  Always wear the leg bag below your knee.  Do not wear your leg bag at night. How to care for your skin and catheter Supplies needed  A clean washcloth.  Water and mild soap.  A clean towel. Caring for your skin and catheter      Clean the skin around your catheter every day: 1. Wash your hands with soap and water. 2. Wet a clean washcloth in warm water and mild soap. 3. Clean the skin around your urethra.  If you are  male:  Gently spread the folds of skin around your vagina (labia).  With the washcloth in your other hand, wipe the inner side of your labia on each side. Wipe from front to back.  If you are male:  Pull back any skin that covers the end of your penis (foreskin).  With the washcloth in your other hand, wipe your penis in small circles. Start wiping at the tip of your penis, then move away from the catheter.  Move the foreskin back in place, if needed. 4. With your free hand, hold the catheter close to where it goes into your body.  Keep holding the catheter during cleaning so it does not get pulled out. 5. With the washcloth in your other hand, clean the catheter.  Only wipe downward on the catheter.  Do not wipe upward toward your body. Doing this may push germs into your urethra and cause infection. 6. Use a clean towel to pat-dry the catheter and the skin around it. Make sure to wipe off all soap. 7. Wash your hands with soap and water.  Shower every day. Do not take baths.  Do not use cream, ointment, or lotion on the area where the catheter goes into your body, unless your doctor tells you   to.  Do not use powders, sprays, or lotions on your genital area.  Check your skin around the catheter every day for signs of infection. Check for: ? Redness, swelling, or pain. ? Fluid or blood. ? Warmth. ? Pus or a bad smell. How to empty the bag Supplies needed  Rubbing alcohol.  Gauze pad or cotton ball.  Tape or a leg strap. Emptying the bag Pour the pee out of your bag when it is ?- full, or at least 2-3 times a day. Do this for your overnight bag and your leg bag. 1. Wash your hands with soap and water. 2. Separate (detach) the bag from your leg. 3. Hold the bag over the toilet or a clean pail. Keep the bag lower than your hips and bladder. This is so the pee (urine) does not go back into the tube. 4. Open the pour spout. It is at the bottom of the bag. 5. Empty the  pee into the toilet or pail. Do not let the pour spout touch any surface. 6. Put rubbing alcohol on a gauze pad or cotton ball. 7. Use the gauze pad or cotton ball to clean the pour spout. 8. Close the pour spout. 9. Attach the bag to your leg with tape or a leg strap. 10. Wash your hands with soap and water. Follow instructions for cleaning the drainage bag:  From the product maker.  As told by your doctor. How to change the bag Supplies needed  Alcohol wipes.  A clean bag.  Tape or a leg strap. Changing the bag Replace your bag when it starts to leak, smell bad, or look dirty. 1. Wash your hands with soap and water. 2. Separate the dirty bag from your leg. 3. Pinch the catheter with your fingers so that pee does not spill out. 4. Separate the catheter tube from the bag tube where these tubes connect (at the connection valve). Do not let the tubes touch any surface. 5. Clean the end of the catheter tube with an alcohol wipe. Use a different alcohol wipe to clean the end of the bag tube. 6. Connect the catheter tube to the tube of the clean bag. 7. Attach the clean bag to your leg with tape or a leg strap. Do not make the bag tight on your leg. 8. Wash your hands with soap and water. General rules   Never pull on your catheter. Never try to take it out. Doing that can hurt you.  Always wash your hands before and after you touch your catheter or bag. Use a mild, fragrance-free soap. If you do not have soap and water, use hand sanitizer.  Always make sure there are no twists or bends (kinks) in the catheter tube.  Always make sure there are no leaks in the catheter or bag.  Drink enough fluid to keep your pee pale yellow.  Do not take baths, swim, or use a hot tub.  If you are male, wipe from front to back after you poop (have a bowel movement). Contact a doctor if:  Your pee is cloudy.  Your pee smells worse than usual.  Your catheter gets clogged.  Your catheter  leaks.  Your bladder feels full. Get help right away if:  You have redness, swelling, or pain where the catheter goes into your body.  You have fluid, blood, pus, or a bad smell coming from the area where the catheter goes into your body.  Your skin feels warm where   where the catheter goes into your body.  You have a fever.  You have pain in your: ? Belly (abdomen). ? Legs. ? Lower back. ? Bladder.  You see blood in the catheter.  Your pee is pink or red.  You feel sick to your stomach (nauseous).  You throw up (vomit).  You have chills.  Your pee is not draining into the bag.  Your catheter gets pulled out. Summary  An indwelling urinary catheter is a thin tube that is placed into the bladder to help drain pee (urine) out of the body.  The catheter is placed into the part of the body that drains pee from the bladder (urethra).  Taking good care of your catheter will keep it working properly and help prevent problems.  Always wash your hands before and after touching your catheter or bag.  Never pull on your catheter or try to take it out. This information is not intended to replace advice given to you by your health care provider. Make sure you discuss any questions you have with your health care provider. Document Revised: 02/19/2019 Document Reviewed: 06/13/2017 Elsevier Patient Education  2020 [employer]     AMBULATORY SURGERY  DISCHARGE INSTRUCTIONS   1) The drugs that you were given will stay in your system until tomorrow so for the next 24 hours you should not:  A) Drive an automobile B) Make any legal decisions C) Drink any alcoholic beverage   2) You may resume regular meals tomorrow.  Today it is better to start with liquids and gradually work up to solid foods.  You may eat anything you prefer, but it is better to start with liquids, then soup and crackers, and gradually work up to solid foods.   3) Please notify your doctor immediately if  you have any unusual bleeding, trouble breathing, redness and pain at the surgery site, drainage, fever, or pain not relieved by medication.    4) Additional Instructions:        Please contact your physician with any problems or Same Day Surgery at [phone], Monday through Friday 6 am to 4 pm, or [facility] [place] Main number at [phone]. 

## 2020-05-08 NOTE — Anesthesia Postprocedure Evaluation (Signed)
Anesthesia Post Note  Patient: [name]  Procedure(s) Performed: CYSTOSCOPY WITH HOLMIUM LASER LITHOTRIPSY (N/A Urethra) CYSTOSCOPY/URETHROTOMY (N/A Urethra)  Patient location during evaluation: PACU Anesthesia Type: General Level of consciousness: awake and alert Pain management: pain level controlled Vital Signs Assessment: post-procedure vital signs reviewed and stable Respiratory status: spontaneous breathing, nonlabored ventilation, respiratory function stable and patient connected to nasal cannula oxygen Cardiovascular status: blood pressure returned to baseline and stable Postop Assessment: no apparent nausea or vomiting Anesthetic complications: no   No complications documented.   Last Vitals:  Vitals:   05/08/20 1250 05/08/20 1305  BP: (!) 144/79 (!) 143/76  Pulse: 65   Resp: 16   Temp:  36.4 C  SpO2: 98%     Last Pain:  Vitals:   05/08/20 1305  PainSc: 0-No pain                 Martha Clan

## 2020-05-08 NOTE — Op Note (Signed)
Preoperative diagnosis: 1. Urethral calculus (N21.1)                                             2. Urethral stricture (N35.11)                                             3. Bladder diverticulum (N32.3)   Postoperative diagnosis:same   Procedure: 1. Cystoscopy with Holmium laser lithotripsy of urethral calculus (                        (CPT 52310)                      2. Visual internal urethrotomy with Holmium laser (CPT 52276)                      3. Foley catheter placement  (CPT 8171559442)  Surgeon: Otelia Limes. Yves Dill MD  Anesthesia: General  Indications:See the history and physical. After informed consent the above procedure(s) were requested     Technique and findings: After adequate general anesthesia been obtained the patient was placed into dorsal lithotomy position and the perineum was prepped and draped in the usual fashion.  85 French cystoscope was coupled the camera and then placed into the urethra.  Care was encountered at the mid urethra.  A 0.035 Glidewire was then placed and advanced beyond the stricture.  The 500 m holmium laser fiber was then introduced through the scope and set at 0.5 W with a frequency of 10.  The stricture was incised at the 12 o'clock position, eventually allowing passage of the scope into the more proximal urethra.  A 10 mm stone was encountered here.  The stone was then pulverized with the holmium laser.  The cystoscope was then passed into the bladder which was thoroughly inspected.  The ureteral orifices were identified and had clear efflux.  No bladder tumors were identified.  The patient had a large posterior bladder diverticulum which was also inspected.  No lesions were identified in the diverticulum.  The cystoscope was then removed.  The guidewire was removed.  10 cc of viscous Xylocaine was instilled within the urethra and bladder.  A 20 French silicone Foley catheter was placed and noted to have clear drainage.  B&O suppository was placed.  The  procedure was then terminated and patient transferred to the recovery room in stable condition.

## 2020-05-08 NOTE — Anesthesia Procedure Notes (Signed)
Procedure Name: Intubation Date/Time: 05/08/2020 11:46 AM Performed by: Doreen Salvage, CRNA Pre-anesthesia Checklist: Patient identified, Patient being monitored, Timeout performed, Emergency Drugs available and Suction available Patient Re-evaluated:Patient Re-evaluated prior to induction Oxygen Delivery Method: Circle system utilized Preoxygenation: Pre-oxygenation with 100% oxygen Induction Type: IV induction Ventilation: Mask ventilation without difficulty Laryngoscope Size: Mac and 4 Grade View: Grade I Tube type: Oral Tube size: 7.5 mm Number of attempts: 1 Airway Equipment and Method: Stylet Placement Confirmation: ETT inserted through vocal cords under direct vision,  positive ETCO2 and breath sounds checked- equal and bilateral Secured at: 22 cm Tube secured with: Tape Dental Injury: Teeth and Oropharynx as per pre-operative assessment

## 2020-05-08 NOTE — H&P (Signed)
NAME: [name], [name] MEDICAL RECORD [id number] ACCOUNT [id number] DATE OF BIRTH:1946/05/16 FACILITY: [facility] LOCATION: [facility] PHYSICIAN:Teresia Myint R. Rumaldo Difatta, MD  HISTORY AND PHYSICAL  DATE OF ADMISSION:  05/08/2020  CHIEF COMPLAINT:  Inability to urinate.  HISTORY OF PRESENT ILLNESS:  The patient is a 74 year old Caucasian male who presented to the office with difficulty voiding.  Cystoscopy in the office revealed a stone in the mid urethra and stricture proximal to the urethra.  The stone could not be  engaged in the office, but the stricture was dilated.  However, he has not been able to pass the stone since dilatation and now has severe difficulty voiding.  He comes in now for cystoscopy with holmium lithotripsy of the urethral calculus and possible  internal urethrotomy.  ALLERGIES:  No drug allergies.  CURRENT MEDICATIONS:  Plavix, losartan, Lasix, carvedilol, multivitamins, aspirin, tamsulosin, and insulin pump.  PAST SURGICAL HISTORY: 1.  Coronary artery stent, 2006. 2.  Transurethral resection of bladder tumor, 2018. 3.  Photovaporization of prostate with GreenLight laser, 2020. 4.  Cystoscopy with laser lithotripsy of urethral calculus, 02/2020.  PAST AND CURRENT MEDICAL CONDITIONS: 1.  Coronary artery disease. 2.  Insulin-dependent diabetes. 3.  Hypertension. 4.  History of bladder cancer.  REVIEW OF SYSTEMS:  The patient denies chest pain, shortness of breath or stroke.  He states that he does bruise easily due to exposure to aspirin and Plavix.  SOCIAL HISTORY:  The patient quit smoking in 1998 with a 15-pack-year history.  He denied alcohol use.  FAMILY HISTORY:  Father died of myocardial infarction at age 32.  Mother died of heart disease at age 34.  PHYSICAL EXAMINATION: VITAL SIGNS:  Height 5 feet 9, weight 176. GENERAL:  Well-nourished, white male in no acute distress. HEENT:  Sclerae were clear. NECK:  No palpable cervical adenopathy.  No  audible carotid bruits. PULMONARY:  Lungs clear to auscultation. CARDIOVASCULAR:  Regular rhythm and rate without audible murmurs. ABDOMEN:  Soft, nontender abdomen. GENITOURINARY:  Circumcised.  Testes were smooth, nontender, 20 mL IN size each. RECTAL:  Deferred.   NEUROMUSCULAR:  Alert and oriented x3.  IMPRESSION: 1.  Obstructing calculus of the mid urethra. 2.  Urethral stricture disease.  PLAN:  Cystoscopy with laser lithotripsy of the urethral calculus and possible internal urethrotomy.  JN/NUANCE  D:05/08/2020 T:05/08/2020 JOB:[id number]

## 2020-05-09 ENCOUNTER — Encounter: Payer: Self-pay | Admitting: Urology

## 2020-05-16 DIAGNOSIS — E103312 Type 1 diabetes mellitus with moderate nonproliferative diabetic retinopathy with macular edema, left eye: Secondary | ICD-10-CM | POA: Diagnosis not present

## 2020-06-05 ENCOUNTER — Other Ambulatory Visit: Payer: Self-pay | Admitting: Cardiovascular Disease

## 2020-06-08 DIAGNOSIS — R351 Nocturia: Secondary | ICD-10-CM | POA: Diagnosis not present

## 2020-06-08 DIAGNOSIS — Z8551 Personal history of malignant neoplasm of bladder: Secondary | ICD-10-CM | POA: Diagnosis not present

## 2020-06-08 DIAGNOSIS — N323 Diverticulum of bladder: Secondary | ICD-10-CM | POA: Diagnosis not present

## 2020-06-08 DIAGNOSIS — N35119 Postinfective urethral stricture, not elsewhere classified, male, unspecified: Secondary | ICD-10-CM | POA: Diagnosis not present

## 2020-06-08 DIAGNOSIS — N211 Calculus in urethra: Secondary | ICD-10-CM | POA: Diagnosis not present

## 2020-06-08 DIAGNOSIS — R300 Dysuria: Secondary | ICD-10-CM | POA: Diagnosis not present

## 2020-07-18 DIAGNOSIS — E109 Type 1 diabetes mellitus without complications: Secondary | ICD-10-CM | POA: Diagnosis not present

## 2020-07-18 DIAGNOSIS — E103312 Type 1 diabetes mellitus with moderate nonproliferative diabetic retinopathy with macular edema, left eye: Secondary | ICD-10-CM | POA: Diagnosis not present

## 2020-07-24 DIAGNOSIS — E109 Type 1 diabetes mellitus without complications: Secondary | ICD-10-CM | POA: Diagnosis not present

## 2020-08-18 DIAGNOSIS — H2511 Age-related nuclear cataract, right eye: Secondary | ICD-10-CM | POA: Diagnosis not present

## 2020-08-25 DIAGNOSIS — E1059 Type 1 diabetes mellitus with other circulatory complications: Secondary | ICD-10-CM | POA: Diagnosis not present

## 2020-08-30 DIAGNOSIS — E038 Other specified hypothyroidism: Secondary | ICD-10-CM | POA: Diagnosis not present

## 2020-08-30 DIAGNOSIS — E1059 Type 1 diabetes mellitus with other circulatory complications: Secondary | ICD-10-CM | POA: Diagnosis not present

## 2020-08-30 DIAGNOSIS — R809 Proteinuria, unspecified: Secondary | ICD-10-CM | POA: Diagnosis not present

## 2020-08-30 DIAGNOSIS — E103212 Type 1 diabetes mellitus with mild nonproliferative diabetic retinopathy with macular edema, left eye: Secondary | ICD-10-CM | POA: Diagnosis not present

## 2020-08-30 DIAGNOSIS — E1029 Type 1 diabetes mellitus with other diabetic kidney complication: Secondary | ICD-10-CM | POA: Diagnosis not present

## 2020-08-30 DIAGNOSIS — Z9641 Presence of insulin pump (external) (internal): Secondary | ICD-10-CM | POA: Diagnosis not present

## 2020-08-30 DIAGNOSIS — E1042 Type 1 diabetes mellitus with diabetic polyneuropathy: Secondary | ICD-10-CM | POA: Diagnosis not present

## 2020-09-12 DIAGNOSIS — N323 Diverticulum of bladder: Secondary | ICD-10-CM | POA: Diagnosis not present

## 2020-09-12 DIAGNOSIS — R310 Gross hematuria: Secondary | ICD-10-CM | POA: Diagnosis not present

## 2020-09-12 DIAGNOSIS — Z8551 Personal history of malignant neoplasm of bladder: Secondary | ICD-10-CM | POA: Diagnosis not present

## 2020-09-13 ENCOUNTER — Encounter
Admission: RE | Admit: 2020-09-13 | Discharge: 2020-09-13 | Disposition: A | Discharge status: Home / Self Care | Payer: Medicare HMO | Source: Ambulatory Visit | Attending: Urology | Admitting: Urology

## 2020-09-13 ENCOUNTER — Other Ambulatory Visit: Payer: Self-pay

## 2020-09-13 DIAGNOSIS — Z01818 Encounter for other preprocedural examination: Secondary | ICD-10-CM | POA: Diagnosis not present

## 2020-09-13 DIAGNOSIS — Z20822 Contact with and (suspected) exposure to covid-19: Secondary | ICD-10-CM | POA: Insufficient documentation

## 2020-09-13 DIAGNOSIS — Z01810 Encounter for preprocedural cardiovascular examination: Secondary | ICD-10-CM | POA: Diagnosis not present

## 2020-09-13 DIAGNOSIS — N323 Diverticulum of bladder: Secondary | ICD-10-CM | POA: Diagnosis not present

## 2020-09-13 DIAGNOSIS — R310 Gross hematuria: Secondary | ICD-10-CM | POA: Diagnosis not present

## 2020-09-13 DIAGNOSIS — Z8551 Personal history of malignant neoplasm of bladder: Secondary | ICD-10-CM | POA: Diagnosis not present

## 2020-09-13 LAB — BASIC METABOLIC PANEL
Anion gap: 9 (ref 5–15)
BUN: 19 mg/dL (ref 8–23)
CO2: 29 mmol/L (ref 22–32)
Calcium: 8.7 mg/dL — ABNORMAL LOW (ref 8.9–10.3)
Chloride: 104 mmol/L (ref 98–111)
Creatinine, Ser: 0.95 mg/dL (ref 0.61–1.24)
GFR, Estimated: >60 mL/min (ref >60)
Glucose, Bld: 247 mg/dL — ABNORMAL HIGH (ref 70–99)
Potassium: 4.4 mmol/L (ref 3.5–5.1)
Sodium: 142 mmol/L (ref 135–145)

## 2020-09-13 LAB — CBC
HCT: 33 % — ABNORMAL LOW (ref 39.0–52.0)
Hemoglobin: 10.6 g/dL — ABNORMAL LOW (ref 13.0–17.0)
MCH: 30.7 pg (ref 26.0–34.0)
MCHC: 32.1 g/dL (ref 30.0–36.0)
MCV: 95.7 fL (ref 80.0–100.0)
Platelets: 243 10*3/uL (ref 150–400)
RBC: 3.45 MIL/uL — ABNORMAL LOW (ref 4.22–5.81)
RDW: 14.1 % (ref 11.5–15.5)
WBC: 9 10*3/uL (ref 4.0–10.5)
nRBC: 0 % (ref 0.0–0.2)

## 2020-09-13 LAB — SARS CORONAVIRUS 2 (TAT 6-24 HRS): SARS Coronavirus 2: NEGATIVE

## 2020-09-14 ENCOUNTER — Other Ambulatory Visit: Payer: Self-pay

## 2020-09-14 ENCOUNTER — Encounter
Admission: RE | Disposition: A | Discharge status: Home / Self Care | Payer: Self-pay | Source: Home / Self Care | Attending: Urology

## 2020-09-14 ENCOUNTER — Ambulatory Visit: Payer: Medicare HMO | Admitting: Urgent Care

## 2020-09-14 ENCOUNTER — Ambulatory Visit: Payer: Medicare HMO | Admitting: Anesthesiology

## 2020-09-14 ENCOUNTER — Encounter: Payer: Self-pay | Admitting: Urology

## 2020-09-14 ENCOUNTER — Ambulatory Visit
Admission: RE | Admit: 2020-09-14 | Discharge: 2020-09-14 | Disposition: A | Discharge status: Home / Self Care | Payer: Medicare HMO | Attending: Urology | Admitting: Urology

## 2020-09-14 DIAGNOSIS — N211 Calculus in urethra: Secondary | ICD-10-CM | POA: Diagnosis not present

## 2020-09-14 DIAGNOSIS — E785 Hyperlipidemia, unspecified: Secondary | ICD-10-CM | POA: Diagnosis not present

## 2020-09-14 DIAGNOSIS — N35014 Post-traumatic urethral stricture, male, unspecified: Secondary | ICD-10-CM | POA: Diagnosis not present

## 2020-09-14 DIAGNOSIS — N35919 Unspecified urethral stricture, male, unspecified site: Secondary | ICD-10-CM | POA: Insufficient documentation

## 2020-09-14 DIAGNOSIS — I10 Essential (primary) hypertension: Secondary | ICD-10-CM | POA: Diagnosis not present

## 2020-09-14 DIAGNOSIS — R319 Hematuria, unspecified: Secondary | ICD-10-CM | POA: Diagnosis not present

## 2020-09-14 DIAGNOSIS — Z8551 Personal history of malignant neoplasm of bladder: Secondary | ICD-10-CM | POA: Insufficient documentation

## 2020-09-14 DIAGNOSIS — N323 Diverticulum of bladder: Secondary | ICD-10-CM | POA: Diagnosis not present

## 2020-09-14 DIAGNOSIS — E119 Type 2 diabetes mellitus without complications: Secondary | ICD-10-CM | POA: Diagnosis not present

## 2020-09-14 HISTORY — PX: CYSTOSCOPY WITH DIRECT VISION INTERNAL URETHROTOMY: SHX6637

## 2020-09-14 LAB — GLUCOSE, CAPILLARY
Glucose-Capillary: 211 mg/dL — ABNORMAL HIGH (ref 70–99)
Glucose-Capillary: 271 mg/dL — ABNORMAL HIGH (ref 70–99)

## 2020-09-14 SURGERY — CYSTOSCOPY, WITH DIRECT VISION INTERNAL URETHROTOMY
Anesthesia: General

## 2020-09-14 MED ORDER — SODIUM CHLORIDE 0.9 % IV SOLN: INTRAVENOUS | Status: DC

## 2020-09-14 MED ORDER — FENTANYL CITRATE (PF) 100 MCG/2ML IJ SOLN
INTRAMUSCULAR | Status: AC
  Filled 2020-09-14: qty 2

## 2020-09-14 MED ORDER — OXYCODONE HCL 5 MG/5ML PO SOLN: 5.0000 mg | Freq: Once | ORAL | Status: AC | PRN

## 2020-09-14 MED ORDER — BELLADONNA ALKALOIDS-OPIUM 16.2-60 MG RE SUPP
RECTAL | Status: AC
  Filled 2020-09-14: qty 1

## 2020-09-14 MED ORDER — LIDOCAINE HCL URETHRAL/MUCOSAL 2 % EX GEL
CUTANEOUS | Status: AC
  Filled 2020-09-14: qty 10

## 2020-09-14 MED ORDER — ORAL CARE MOUTH RINSE: 15.0000 mL | Freq: Once | OROMUCOSAL | Status: AC

## 2020-09-14 MED ORDER — DEXMEDETOMIDINE (PRECEDEX) IN NS 20 MCG/5ML (4 MCG/ML) IV SYRINGE
PREFILLED_SYRINGE | INTRAVENOUS | Status: AC
  Filled 2020-09-14: qty 5

## 2020-09-14 MED ORDER — ONDANSETRON HCL 4 MG/2ML IJ SOLN
INTRAMUSCULAR | Status: AC
  Filled 2020-09-14: qty 2

## 2020-09-14 MED ORDER — KETAMINE HCL 10 MG/ML IJ SOLN
INTRAMUSCULAR | Status: DC | PRN
  Administered 2020-09-14: 30 mg via INTRAVENOUS

## 2020-09-14 MED ORDER — PHENYLEPHRINE HCL (PRESSORS) 10 MG/ML IV SOLN
INTRAVENOUS | Status: DC | PRN
  Administered 2020-09-14: 100 ug via INTRAVENOUS

## 2020-09-14 MED ORDER — FENTANYL CITRATE (PF) 100 MCG/2ML IJ SOLN
INTRAMUSCULAR | Status: AC
  Administered 2020-09-14: 50 ug via INTRAVENOUS
  Filled 2020-09-14: qty 2

## 2020-09-14 MED ORDER — FENTANYL CITRATE (PF) 100 MCG/2ML IJ SOLN
INTRAMUSCULAR | Status: DC | PRN
Start: 2020-09-14 — End: 2020-09-14

## 2020-09-14 MED ORDER — HYDROCODONE-ACETAMINOPHEN 10-325 MG PO TABS: 1.0000 | ORAL_TABLET | Freq: Four times a day (QID) | ORAL | 0 refills | Status: DC | PRN

## 2020-09-14 MED ORDER — DEXAMETHASONE SODIUM PHOSPHATE 10 MG/ML IJ SOLN
INTRAMUSCULAR | Status: DC | PRN
  Administered 2020-09-14: 6 mg via INTRAVENOUS

## 2020-09-14 MED ORDER — CEFAZOLIN SODIUM-DEXTROSE 1-4 GM/50ML-% IV SOLN
INTRAVENOUS | Status: AC
  Filled 2020-09-14: qty 50

## 2020-09-14 MED ORDER — CHLORHEXIDINE GLUCONATE 0.12 % MT SOLN: 15.0000 mL | Freq: Once | OROMUCOSAL | Status: AC

## 2020-09-14 MED ORDER — ONDANSETRON HCL 4 MG/2ML IJ SOLN
INTRAMUSCULAR | Status: DC | PRN
  Administered 2020-09-14: 4 mg via INTRAVENOUS

## 2020-09-14 MED ORDER — LIDOCAINE HCL (PF) 2 % IJ SOLN
INTRAMUSCULAR | Status: AC
  Filled 2020-09-14: qty 5

## 2020-09-14 MED ORDER — SUGAMMADEX SODIUM 500 MG/5ML IV SOLN
INTRAVENOUS | Status: AC
  Filled 2020-09-14: qty 5

## 2020-09-14 MED ORDER — SODIUM CHLORIDE (PF) 0.9 % IJ SOLN
INTRAMUSCULAR | Status: AC
  Filled 2020-09-14: qty 10

## 2020-09-14 MED ORDER — ACETAMINOPHEN 10 MG/ML IV SOLN
INTRAVENOUS | Status: DC | PRN
  Administered 2020-09-14: 1000 mg via INTRAVENOUS

## 2020-09-14 MED ORDER — CHLORHEXIDINE GLUCONATE 0.12 % MT SOLN
OROMUCOSAL | Status: AC
  Administered 2020-09-14: 15 mL via OROMUCOSAL
  Filled 2020-09-14: qty 15

## 2020-09-14 MED ORDER — BELLADONNA ALKALOIDS-OPIUM 16.2-60 MG RE SUPP
RECTAL | Status: DC | PRN
  Administered 2020-09-14: 1 via RECTAL

## 2020-09-14 MED ORDER — FAMOTIDINE 20 MG PO TABS
ORAL_TABLET | ORAL | Status: AC
  Administered 2020-09-14: 20 mg
  Filled 2020-09-14: qty 1

## 2020-09-14 MED ORDER — FENTANYL CITRATE (PF) 100 MCG/2ML IJ SOLN
25.0000 ug | INTRAMUSCULAR | Status: DC | PRN
  Administered 2020-09-14 (×2): 50 ug via INTRAVENOUS

## 2020-09-14 MED ORDER — FENTANYL CITRATE (PF) 100 MCG/2ML IJ SOLN
INTRAMUSCULAR | Status: DC | PRN
Start: 2020-09-14 — End: 2020-09-14
  Administered 2020-09-14 (×3): 25 ug via INTRAVENOUS

## 2020-09-14 MED ORDER — CEFAZOLIN SODIUM-DEXTROSE 1-4 GM/50ML-% IV SOLN
1.0000 g | Freq: Once | INTRAVENOUS | Status: AC
  Administered 2020-09-14: 1 g via INTRAVENOUS

## 2020-09-14 MED ORDER — DEXAMETHASONE SODIUM PHOSPHATE 10 MG/ML IJ SOLN
INTRAMUSCULAR | Status: AC
  Filled 2020-09-14: qty 1

## 2020-09-14 MED ORDER — PROPOFOL 10 MG/ML IV BOLUS
INTRAVENOUS | Status: AC
  Filled 2020-09-14: qty 20

## 2020-09-14 MED ORDER — LIDOCAINE HCL (CARDIAC) PF 100 MG/5ML IV SOSY
PREFILLED_SYRINGE | INTRAVENOUS | Status: DC | PRN
  Administered 2020-09-14: 80 mg via INTRAVENOUS

## 2020-09-14 MED ORDER — PROPOFOL 10 MG/ML IV BOLUS
INTRAVENOUS | Status: DC | PRN
  Administered 2020-09-14 (×2): 40 mg via INTRAVENOUS
  Administered 2020-09-14: 120 mg via INTRAVENOUS

## 2020-09-14 MED ORDER — OXYCODONE HCL 5 MG PO TABS
ORAL_TABLET | ORAL | Status: AC
  Administered 2020-09-14: 5 mg via ORAL
  Filled 2020-09-14: qty 1

## 2020-09-14 MED ORDER — OXYCODONE HCL 5 MG PO TABS: 5.0000 mg | ORAL_TABLET | Freq: Once | ORAL | Status: AC | PRN

## 2020-09-14 MED ORDER — ROCURONIUM BROMIDE 10 MG/ML (PF) SYRINGE
PREFILLED_SYRINGE | INTRAVENOUS | Status: AC
  Filled 2020-09-14: qty 10

## 2020-09-14 MED ORDER — DEXMEDETOMIDINE HCL 200 MCG/2ML IV SOLN
INTRAVENOUS | Status: DC | PRN
  Administered 2020-09-14: 8 ug via INTRAVENOUS
  Administered 2020-09-14: 4 ug via INTRAVENOUS
  Administered 2020-09-14: 20 ug via INTRAVENOUS

## 2020-09-14 MED ORDER — ACETAMINOPHEN 10 MG/ML IV SOLN
INTRAVENOUS | Status: AC
  Filled 2020-09-14: qty 100

## 2020-09-14 MED ORDER — LIDOCAINE HCL URETHRAL/MUCOSAL 2 % EX GEL
CUTANEOUS | Status: DC | PRN
  Administered 2020-09-14: 1 via URETHRAL

## 2020-09-14 SURGICAL SUPPLY — 24 items
BAG DRAIN CYSTO-URO LG1000N (MISCELLANEOUS) ×3 IMPLANT
BAG DRN RND TRDRP ANRFLXCHMBR (UROLOGICAL SUPPLIES) ×1
BAG URINE DRAIN 2000ML AR STRL (UROLOGICAL SUPPLIES) ×3 IMPLANT
CATH FOLEY 2WAY  5CC 20FR SIL (CATHETERS) ×2
CATH FOLEY 2WAY 5CC 20FR SIL (CATHETERS) ×1 IMPLANT
COVER WAND RF STERILE (DRAPES) ×3 IMPLANT
FIBER LASER FLEXIVA 365 (UROLOGICAL SUPPLIES) ×2 IMPLANT
GLOVE BIO SURGEON STRL SZ7 (GLOVE) ×6 IMPLANT
GLOVE BIO SURGEON STRL SZ7.5 (GLOVE) ×3 IMPLANT
GLOVE BIOGEL PI IND STRL 7.0 (GLOVE) ×1 IMPLANT
GLOVE BIOGEL PI INDICATOR 7.0 (GLOVE) ×2
GOWN STRL REUS W/ TWL LRG LVL3 (GOWN DISPOSABLE) ×1 IMPLANT
GOWN STRL REUS W/ TWL XL LVL3 (GOWN DISPOSABLE) ×1 IMPLANT
GOWN STRL REUS W/TWL LRG LVL3 (GOWN DISPOSABLE) ×3
GOWN STRL REUS W/TWL XL LVL3 (GOWN DISPOSABLE) ×3
KIT TURNOVER CYSTO (KITS) ×3 IMPLANT
PACK CYSTO AR (MISCELLANEOUS) ×3 IMPLANT
SCALPEL LANCET BLADE (MISCELLANEOUS) ×3 IMPLANT
SET CYSTO W/LG BORE CLAMP LF (SET/KITS/TRAYS/PACK) ×3 IMPLANT
SYR TOOMEY IRRIG 70ML (MISCELLANEOUS) ×3
SYRINGE TOOMEY IRRIG 70ML (MISCELLANEOUS) IMPLANT
WATER STERILE IRR 1000ML POUR (IV SOLUTION) ×3 IMPLANT
WATER STERILE IRR 3000ML UROMA (IV SOLUTION) ×3 IMPLANT
WIRE G XSTIFF 025X145 (WIRE) ×2 IMPLANT

## 2020-09-14 NOTE — Anesthesia Preprocedure Evaluation (Signed)
Anesthesia Evaluation  Patient identified by MRN, date of birth, ID band Patient awake    Reviewed: Allergy & Precautions, H&P , NPO status , Patient's Chart, lab work & pertinent test results, reviewed documented beta blocker date and time   History of Anesthesia Complications Negative for: history of anesthetic complications  Airway Mallampati: III  TM Distance: >3 FB Neck ROM: full    Dental  (+) Poor Dentition, Chipped, Caps   Pulmonary neg pulmonary ROS, neg shortness of breath, former smoker,    Pulmonary exam normal        Cardiovascular Exercise Tolerance: Good hypertension, (-) angina+ CAD, + Past MI and + Peripheral Vascular Disease  (-) DOE Normal cardiovascular exam     Neuro/Psych negative neurological ROS  negative psych ROS   GI/Hepatic Neg liver ROS, GERD  Controlled,  Endo/Other  diabetes, Well Controlled, Type 1, Insulin Dependent  Renal/GU      Musculoskeletal  (+) Arthritis ,   Abdominal   Peds  Hematology  (+) Blood dyscrasia, anemia ,   Anesthesia Other Findings Past Medical History: No date: Anemia No date: Arthritis     Comment:  right knee No date: Cancer [facility])     Comment:  BLADDER CANCER (2018)skin cancer No date: Coronary artery disease No date: Diabetes mellitus No date: Edema No date: GERD (gastroesophageal reflux disease)     Comment:  rare-NO MEDS 2007: Heart attack ([place]) No date: Hyperlipidemia No date: Hypertension No date: Hypoglycemia     Comment:  history of episodes x2 No date: Ischemic cardiomyopathy No date: Myocardial infarction ([place]) No date: Peripheral vascular disease ([name])     Comment:  swelling in Lt ankle due to prior injury Past Surgical History: No date: CARDIAC CATHETERIZATION 02/18/2018: COLONOSCOPY WITH PROPOFOL; N/A     Comment:  Procedure: COLONOSCOPY WITH PROPOFOL;  Surgeon: [place],               Benay Pike, MD;  Location: [facility];   Service:               Gastroenterology;  Laterality: N/A; No date: CORONARY ANGIOPLASTY No date: CORONARY STENT PLACEMENT     Comment:  To an occluded LAD x2 10/21/2017: CYSTOSCOPY WITH BIOPSY; N/A     Comment:  Procedure: CYSTOSCOPY WITH BLADDER BIOPSY;  Surgeon:               Royston Cowper, MD;  Location: [facility] ORS;  Service:               Urology;  Laterality: N/A; 12/08/2018: CYSTOSCOPY WITH BIOPSY; N/A     Comment:  Procedure: CYSTOSCOPY WITH BLADDER BIOPSY-MITOMYCIN;                Surgeon: Royston Cowper, MD;  Location: [facility] ORS;                Service: Urology;  Laterality: N/A; 06/05/2016: EXCISION MASS LOWER EXTREMETIES; Right     Comment:  Procedure: EXCISION OF LESION RIGHT LOWER LEG;  Surgeon:              Hubbard Robinson, MD;  Location: [facility] ORS;  Service:               General;  Laterality: Right; No date: SKIN CANCER EXCISION     Comment:  chest 07/15/2017: TRANSURETHRAL RESECTION OF BLADDER TUMOR; N/A     Comment:  Procedure: TRANSURETHRAL RESECTION OF BLADDER TUMOR               (  TURBT);  Surgeon: Royston Cowper, MD;  Location: [facility]               ORS;  Service: Urology;  Laterality: N/A; BMI    Body Mass Index: 25.99 kg/m     Reproductive/Obstetrics negative OB ROS                             Anesthesia Physical  Anesthesia Plan  ASA: III  Anesthesia Plan:    Post-op Pain Management:    Induction: Intravenous  PONV Risk Score and Plan:   Airway Management Planned: LMA  Additional Equipment:   Intra-op Plan:   Post-operative Plan: Extubation in OR  Informed Consent: I have reviewed the patients History and Physical, chart, labs and discussed the procedure including the risks, benefits and alternatives for the proposed anesthesia with the patient or authorized representative who has indicated his/her understanding and acceptance.     Dental Advisory Given  Plan Discussed with: Anesthesiologist, CRNA and  Surgeon  Anesthesia Plan Comments: (Patient consented for risks of anesthesia including but not limited to:  - adverse reactions to medications - damage to eyes, teeth, lips or other oral mucosa - nerve damage due to positioning  - sore throat or hoarseness - Damage to heart, brain, nerves, lungs, other parts of body or loss of life  Patient voiced understanding.)        Anesthesia Quick Evaluation

## 2020-09-14 NOTE — Anesthesia Procedure Notes (Signed)
Procedure Name: LMA Insertion Date/Time: 09/14/2020 11:49 AM Performed by: Lia Foyer, CRNA Pre-anesthesia Checklist: Patient identified, Emergency Drugs available, Suction available and Patient being monitored Patient Re-evaluated:Patient Re-evaluated prior to induction Oxygen Delivery Method: Circle system utilized Preoxygenation: Pre-oxygenation with 100% oxygen Induction Type: IV induction Ventilation: Mask ventilation without difficulty LMA: LMA flexible inserted LMA Size: 4.5 Tube type: Oral Number of attempts: 1 Airway Equipment and Method: Oral airway and Patient positioned with wedge pillow Placement Confirmation: positive ETCO2 and breath sounds checked- equal and bilateral Tube secured with: Tape Dental Injury: Teeth and Oropharynx as per pre-operative assessment

## 2020-09-14 NOTE — Progress Notes (Signed)
Pt CBG in PACU was 271. Dr. Amie Critchley notified. Acknowledged. No new orders at this time. Stated pt to put on his insulin pump when he woke up more.

## 2020-09-14 NOTE — H&P (Signed)
NAME: [name], [name] MEDICAL RECORD [id number] ACCOUNT [id number] DATE OF BIRTH:09/25/1946 FACILITY: [facility] LOCATION: [facility] PHYSICIAN:Keenan Trefry Farrel Conners, MD  HISTORY AND PHYSICAL  DATE OF ADMISSION:  09/14/2020  CHIEF COMPLAINT:  Difficulty voiding.  HISTORY OF PRESENT ILLNESS:  The patient is a 74 year old male who presented to the office with severe difficulty voiding.  He underwent flexible cystoscopy and was found to have stricture disease in the bulbar urethra and scope could not be passed  beyond the stricture.  He also has a history of kidney stones and had a kidney stone previously lodged in the urethra requiring laser lithotripsy in June of this year.  He was concerned that there may be another stone just proximal to the stricture.  PAST MEDICAL HISTORY: ALLERGIES:  No drug allergies.  CURRENT MEDICATIONS:  Plavix, losartan, Lasix, carvedilol, multivitamins, aspirin, tamsulosin and insulin.  PAST SURGICAL HISTORY: 1.  Coronary artery stent 2006. 2.  Transurethral resection of bladder tumor 2018. 3.  Photovaporization of prostate with GreenLight laser 2020. 4.  Cystoscopy with laser lithotripsy of urethral calculus in April of 2021 and in June of 2021.  PAST AND CURRENT CONDITIONS: 1.  Coronary artery disease. 2.  Insulin-dependent diabetes. 3.  Hypertension. 4.  History of bladder cancer. 5.  Bladder diverticulum. 6.  Urethral stricture disease. 7.  Kidney stones.  REVIEW OF SYSTEMS:  The patient denies chest pain, shortness of breath or stroke.  He tends to bruise easily due to exposure to aspirin and Plavix.  SOCIAL HISTORY:  The patient quit smoking in 1998 with a 15-pack-year history.  He denied alcohol use.  FAMILY HISTORY:  Father died of myocardial infarction at age 37.  Mother died of heart disease at age 16.  PHYSICAL EXAMINATION: VITAL SIGNS:  Height 5 feet 9, weight 176 pounds. GENERAL:  Well-nourished, white male in no distress. HEENT:   Sclerae were clear. NECK:  No palpable thyroid masses. LYMPHATICS:  No palpable cervical or inguinal adenopathy. PULMONARY:  Lungs clear to auscultation. CARDIOVASCULAR:  Regular rhythm and rate without audible murmurs. ABDOMEN:  Soft, nontender abdomen. GENITOURINARY:  Circumcised.  Testes were smooth, nontender, approximately 20 mL in size each. RECTAL:  Deferred. NEUROMUSCULAR:  Alert and oriented x3.  IMPRESSION: 1.  Urethral stricture disease. 2.  Possible urethral calculus.  PLAN:  Cystoscopy with internal urethrotomy using the holmium laser.  HN/NUANCE  D:09/13/2020 T:09/13/2020 JOB:[id number]

## 2020-09-14 NOTE — Discharge Instructions (Addendum)
Urethral Stricture  Urethral stricture is narrowing of the tube (urethra) that carries urine from the bladder out of the body. The urethra can become narrow due to scar tissue from an injury or infection. This can make it difficult to pass urine. In women, the urethra opens above the vaginal opening. In men, the urethra opens at the tip of the penis, and the urethra is much longer than it is in women. Because of the length of the male urethra, urethral stricture is much more common in men. This condition is treated with surgery. What are the causes? In both men and women, common causes of urethral stricture include:  Urinary tract infection (UTI).  Sexually transmitted infection (STI).  Use of a tube placed into the urethra to drain urine from the bladder (urinary catheter).  Urinary tract surgery. In men, common causes of urethral stricture include:  A severe injury to the pelvis.  Prostate surgery.  Injury to the penis. In many cases, the cause of urethral stricture is not known. What increases the risk? You are more likely to develop this condition if you:  Are male. Men who have had prostate surgery are at risk of developing this condition.  Use a urinary catheter.  Have had urinary tract surgery. What are the signs or symptoms? The main symptom of this condition is difficulty passing urine. This may cause decreased urine flow, dribbling, or spraying of urine. Other symptom of this condition may include:  Frequent UTIs.  Blood in the urine.  Pain when urinating.  Swelling of the penis in men.  Inability to pass urine (urinary obstruction). How is this diagnosed? This condition may be diagnosed based on:  Your medical history and a physical exam.  Urine tests to check for infection or bleeding.  X-rays.  Ultrasound.  Retrograde urethrogram. This is a type of test in which dye is injected into the urethra and then an X-ray is taken.  Urethroscopy. This is when  a thin tube with a light and camera on the end (urethroscope) is used to look at the urethra. How is this treated? This condition is treated with surgery. The type of surgery that you have depends on the severity of your condition. You may have:  Urethral dilation. In this procedure, the narrow part of the urethra is stretched open (dilated) with dilating instruments or a small balloon.  Urethrotomy. In this procedure, a urethroscope is placed into the urethra, and the narrow part of the urethra is cut open with a surgical blade inserted through the urethroscope.  Open surgery. In this procedure, an incision is made in the urethra, the narrow part is removed, and the urethra is reconstructed. Follow these instructions at home:   Take over-the-counter and prescription medicines only as told by your health care provider.  If you were prescribed an antibiotic medicine, take it as told by your health care provider. Do not stop taking the antibiotic even if you start to feel better.  Drink enough fluid to keep your urine pale yellow.  Keep all follow-up visits as told by your health care provider. This is important. Contact a health care provider if:  You have signs of a urinary tract infection, such as: ? Frequent urination or passing small amounts of urine frequently. ? Needing to urinate urgently. ? Pain or burning with urination. ? Urine that smells bad or unusual. ? Cloudy urine. ? Pain in the lower abdomen or back. ? Trouble urinating. ? Blood in the urine. ?   Vomiting or being less hungry than normal. ? Diarrhea or abdominal pain. ? Vaginal discharge, if you are male.  Your symptoms are getting worse instead of better. Get help right away if:  You cannot pass urine.  You have a fever.  You have swelling, bruising, or discoloration of your genital area. This includes the penis, scrotum, and inner thighs for men, and the outer genital organs (vulva) and inner thighs for  women.  You develop swelling in your legs.  You have difficulty breathing. Summary  Urethral stricture is narrowing of the tube (urethra) that carries urine from the bladder out of the body. The urethra can become narrow due to scar tissue from an injury or infection.  This condition can make it difficult to pass urine.  This condition is treated with surgery. The type of surgery that you have depends on the severity of your condition.  Contact a health care provider if your symptoms get worse or you have signs of a urinary tract infection. This information is not intended to replace advice given to you by your health care provider. Make sure you discuss any questions you have with your health care provider. Document Revised: 06/10/2018 Document Reviewed: 06/10/2018 Elsevier Patient Education  [place] This sheet gives you information about how to care for yourself after your procedure. Your health care provider may also give you more specific instructions. If you have problems or questions, contact your health care provider. What can I expect after the procedure? After the procedure, it is common to have:  Burning pain when urinating.  Pain or discomfort in your genital area.  A small amount of blood in your urine. Your health care provider will tell you how long you can expect to have blood in your urine.  Bloody urine leaking from around your catheter. Follow these instructions at home: Catheter and drainage bag   Follow instructions from your health care provider about how to care for your catheter and your drainage bag.  Do not take baths, swim, or use a hot tub until your catheter has been removed. You may take showers while your catheter is in place.  If you have to insert a catheter on your own (self-catheterization) after your catheter is removed, make sure you understand the procedure completely. Carefully follow instructions from  your health care provider. Medicines  Take over-the-counter and prescription medicines only as told by your health care provider.  If you were prescribed an antibiotic medicine, take it as told by your health care provider. Do not stop taking the antibiotic even if you start to feel better.  Ask your health care provider if the medicine prescribed to you: ? Requires you to avoid driving or using heavy machinery. ? Can cause constipation. You may need to take these actions to prevent or treat constipation:  Take over-the-counter or prescription medicines.  Eat foods that are high in fiber, such as beans, whole grains, and fresh fruits and vegetables.  Limit foods that are high in fat and processed sugars, such as fried or sweet foods. Activity   Do not drive for 24 hours if you were given a sedative during your procedure.  Take short walks several times a day during your recovery.  Do not lift anything that is heavier than 10 lb (4.5 kg), or the limit that you are told, until your health care provider says that it is safe.  Return to your normal activities as told by  your health care provider. Ask your health care provider what activities are safe for you.  Do not have sex until your health care provider says it is okay. General instructions  Drink enough fluid to keep your urine pale yellow.  If a bandage (dressing) was applied over the opening of your urethra, change the dressing as told by your health care provider. Make sure you: ? Wash your hands with soap and water before and after you change your dressing. If soap and water are not available, use hand sanitizer. ? Keep your dressing clean and dry.  Do not use any products that contain nicotine or tobacco, such as cigarettes, e-cigarettes, and chewing tobacco. These can delay healing after surgery. If you need help quitting, ask your health care provider.  Keep all follow-up visits as told by your health care provider. This  is important. Contact a health care provider if you:  Have a fever or chills.  Have pain that gets worse or does not get better with medicine.  Have blood in your urine for longer than your health care provider told you to expect.  Are a male and have any of these problems: ? Trouble getting an erection. ? Pain when you have an erection. ? Blood in your semen.  Have any of these problems after your catheter is removed: ? Trouble urinating. ? A slow urine stream. ? Urinating less than usual. ? Several streams or "spray" when you urinate.  Have pain in your abdomen.  Have swelling in your genital area that does not go away. Get help right away if:  You have severe pain.  A lot of blood is leaking from around your catheter.  You have blood clots in your urine.  Your catheter stops draining urine.  You cannot urinate after your catheter is removed.  You have redness, warmth, or pain in your leg.  You have chest pain.  You have trouble breathing. These symptoms may represent a serious problem that is an emergency. Do not wait to see if the symptoms will go away. Get medical help right away. Call your local emergency services (911 in the [place] Do not drive yourself to the hospital. Summary  After the procedure, it is common to have burning pain when urinating and a small amount of blood in your urine.  Follow instructions from your health care provider about how to care for your catheter and your drainage bag.  Take short walks several times a day during your recovery.  If a bandage (dressing) was applied over the opening of your urethra, change the dressing as told by your health care provider.  Contact your health care provider if you have trouble urinating after your catheter is removed. This information is not intended to replace advice given to you by your health care provider. Make sure you discuss any questions you have with your health care provider. Document  Revised: 05/03/2019 Document Reviewed: 05/03/2019 Elsevier Patient Education  [place] is a surgery to treat a section of the urethra that is too narrow (urethral stricture). The urethra is the part of the body that drains urine from the bladder out of the body. Urethral stricture makes it difficult or painful to urinate, and it can increase your risk of having more frequent urinary tract infections (UTIs). The goal of surgery is to open the urethral stricture and restore the normal flow of urine. Urethrotomy is performed by passing a thin tube with a  light and tiny camera on the end (cystoscope) into the urethra. An instrument is used to cut the stricture, which widens the urethra. Tell a health care provider about:  Any allergies you have.  All medicines you are taking, including vitamins, herbs, eye drops, creams, and over-the-counter medicines.  Any problems you or family members have had with anesthetic medicines.  Any blood disorders you have.  Any surgeries you have had.  Any medical conditions you have.  Whether you are pregnant or may be pregnant. What are the risks? Generally, this is a safe procedure. However, problems may occur, including:  Infection.  Bleeding.  Damage to nearby structures or organs.  Urethral stricture coming back after surgery.  Inability to get or keep an erection (erectile dysfunction) in men.  Blood clots. What happens before the procedure? Staying hydrated Follow instructions from your health care provider about hydration, which may include:  Up to 2 hours before the procedure - you may continue to drink clear liquids, such as water, clear fruit juice, black coffee, and plain tea. Eating and drinking restrictions Follow instructions from your health care provider about eating and drinking, which may include:  8 hours before the procedure - stop eating heavy meals or foods, such as meat, fried foods,  or fatty foods.  6 hours before the procedure - stop eating light meals or foods, such as toast or cereal.  6 hours before the procedure - stop drinking milk or drinks that contain milk.  2 hours before the procedure - stop drinking clear liquids. Medicines Ask your health care provider about:  Changing or stopping your regular medicines. This is especially important if you are taking diabetes medicines or blood thinners.  Taking medicines such as aspirin and ibuprofen. These medicines can thin your blood. Do not take these medicines unless your health care provider tells you to take them.  Taking over-the-counter medicines, vitamins, herbs, and supplements. Tests You will have an exam or testing, including:  A complete physical exam.  Blood or urine tests.  X-ray and electrocardiogram, or ECG. General instructions  Ask your health care provider what steps will be taken to help prevent infection. These may include: ? Washing your genital area with a germ-killing soap. ? Taking antibiotic medicine.  You may be asked to shower with a germ-killing soap.  Plan to have someone take you home from the hospital or clinic.  If you will be going home right after the procedure, plan to have someone with you for 24 hours. What happens during the procedure?  An IV will be inserted into one of your veins.  You will be given one or more of the following: ? A medicine to help you relax (sedative). ? A medicine to make you fall asleep (general anesthetic). ? A medicine that is injected into your spine to numb the area below and slightly above the injection site (spinal anesthetic).  The cystoscope will be inserted into the urethra.  An incision will be made in the urethral stricture with a knife or a laser. This incision will allow urine to pass through the stricture.  A thin, flexible tube (catheter) may be inserted through your urethra and into your bladder to hold the incision open  while it heals. The catheter will help drain your urine.  A bandage (dressing) may be placed over the opening of your urethra. The procedure may vary among health care providers and hospitals. What happens after the procedure?   Your blood pressure, heart  rate, breathing rate, and blood oxygen level will be monitored until you leave the hospital or clinic.  You will continue to have a catheter draining your urine. The catheter may be left in for a few days or weeks, depending on the size of the stricture. ? You will be shown how to care for your catheter. ? You may have some blood leaking from around the catheter when you urinate.  Do not drive for 24 hours if you were given a sedative during the procedure.  You may have to wear compression stockings. These stockings help to prevent blood clots and reduce swelling in your legs. Summary  Urethrotomy is a surgery to treat a section of the urethra that is too narrow (urethral stricture).  This procedure is done by passing a thin tube with a light and tiny camera on the end (cystoscope) into the urethra. An instrument is used to cut the stricture, which widens the urethra.  A thin, flexible tube (catheter) may be inserted through your urethra and into your bladder to hold the incision open while it heals. The catheter will help drain your urine.  The catheter may be left in for a few days or weeks after the procedure, depending on the size of the stricture. This information is not intended to replace advice given to you by your health care provider. Make sure you discuss any questions you have with your health care provider. Document Revised: 05/03/2019 Document Reviewed: 05/03/2019 Elsevier Patient Education  [place], Adult An indwelling urinary catheter is a thin tube that is put into your bladder. The tube helps to drain pee (urine) out of your body. The tube goes in through your urethra.  Your urethra is where pee comes out of your body. Your pee will come out through the catheter, then it will go into a bag (drainage bag). Take good care of your catheter so it will work well. How to wear your catheter and bag Supplies needed  Sticky tape (adhesive tape) or a leg strap.  Alcohol wipe or soap and water (if you use tape).  A clean towel (if you use tape).  Large overnight bag.  Smaller bag (leg bag). Wearing your catheter Attach your catheter to your leg with tape or a leg strap.  Make sure the catheter is not pulled tight.  If a leg strap gets wet, take it off and put on a dry strap.  If you use tape to hold the bag on your leg: 1. Use an alcohol wipe or soap and water to wash your skin where the tape made it sticky before. 2. Use a clean towel to pat-dry that skin. 3. Use new tape to make the bag stay on your leg. Wearing your bags You should have been given a large overnight bag.  You may wear the overnight bag in the day or night.  Always have the overnight bag lower than your bladder.  Do not let the bag touch the floor.  Before you go to sleep, put a clean plastic bag in a wastebasket. Then hang the overnight bag inside the wastebasket. You should also have a smaller leg bag that fits under your clothes.  Always wear the leg bag below your knee.  Do not wear your leg bag at night. How to care for your skin and catheter Supplies needed  A clean washcloth.  Water and mild soap.  A clean towel. Caring for your skin and catheter  Clean the skin around your catheter every day: 1. Wash your hands with soap and water. 2. Wet a clean washcloth in warm water and mild soap. 3. Clean the skin around your urethra.  If you are male:  Gently spread the folds of skin around your vagina (labia).  With the washcloth in your other hand, wipe the inner side of your labia on each side. Wipe from front to back.  If you are male:  Pull back any skin  that covers the end of your penis (foreskin).  With the washcloth in your other hand, wipe your penis in small circles. Start wiping at the tip of your penis, then move away from the catheter.  Move the foreskin back in place, if needed. 4. With your free hand, hold the catheter close to where it goes into your body.  Keep holding the catheter during cleaning so it does not get pulled out. 5. With the washcloth in your other hand, clean the catheter.  Only wipe downward on the catheter.  Do not wipe upward toward your body. Doing this may push germs into your urethra and cause infection. 6. Use a clean towel to pat-dry the catheter and the skin around it. Make sure to wipe off all soap. 7. Wash your hands with soap and water.  Shower every day. Do not take baths.  Do not use cream, ointment, or lotion on the area where the catheter goes into your body, unless your doctor tells you to.  Do not use powders, sprays, or lotions on your genital area.  Check your skin around the catheter every day for signs of infection. Check for: ? Redness, swelling, or pain. ? Fluid or blood. ? Warmth. ? Pus or a bad smell. How to empty the bag Supplies needed  Rubbing alcohol.  Gauze pad or cotton ball.  Tape or a leg strap. Emptying the bag Pour the pee out of your bag when it is ?- full, or at least 2-3 times a day. Do this for your overnight bag and your leg bag. 1. Wash your hands with soap and water. 2. Separate (detach) the bag from your leg. 3. Hold the bag over the toilet or a clean pail. Keep the bag lower than your hips and bladder. This is so the pee (urine) does not go back into the tube. 4. Open the pour spout. It is at the bottom of the bag. 5. Empty the pee into the toilet or pail. Do not let the pour spout touch any surface. 6. Put rubbing alcohol on a gauze pad or cotton ball. 7. Use the gauze pad or cotton ball to clean the pour spout. 8. Close the pour spout. 9. Attach  the bag to your leg with tape or a leg strap. 10. Wash your hands with soap and water. Follow instructions for cleaning the drainage bag:  From the product maker.  As told by your doctor. How to change the bag Supplies needed  Alcohol wipes.  A clean bag.  Tape or a leg strap. Changing the bag Replace your bag when it starts to leak, smell bad, or look dirty. 1. Wash your hands with soap and water. 2. Separate the dirty bag from your leg. 3. Pinch the catheter with your fingers so that pee does not spill out. 4. Separate the catheter tube from the bag tube where these tubes connect (at the connection valve). Do not let the tubes touch any surface. 5. Clean the end of  the catheter tube with an alcohol wipe. Use a different alcohol wipe to clean the end of the bag tube. 6. Connect the catheter tube to the tube of the clean bag. 7. Attach the clean bag to your leg with tape or a leg strap. Do not make the bag tight on your leg. 8. Wash your hands with soap and water. General rules   Never pull on your catheter. Never try to take it out. Doing that can hurt you.  Always wash your hands before and after you touch your catheter or bag. Use a mild, fragrance-free soap. If you do not have soap and water, use hand sanitizer.  Always make sure there are no twists or bends (kinks) in the catheter tube.  Always make sure there are no leaks in the catheter or bag.  Drink enough fluid to keep your pee pale yellow.  Do not take baths, swim, or use a hot tub.  If you are male, wipe from front to back after you poop (have a bowel movement). Contact a doctor if:  Your pee is cloudy.  Your pee smells worse than usual.  Your catheter gets clogged.  Your catheter leaks.  Your bladder feels full. Get help right away if:  You have redness, swelling, or pain where the catheter goes into your body.  You have fluid, blood, pus, or a bad smell coming from the area where the catheter  goes into your body.  Your skin feels warm where the catheter goes into your body.  You have a fever.  You have pain in your: ? Belly (abdomen). ? Legs. ? Lower back. ? Bladder.  You see blood in the catheter.  Your pee is pink or red.  You feel sick to your stomach (nauseous).  You throw up (vomit).  You have chills.  Your pee is not draining into the bag.  Your catheter gets pulled out. Summary  An indwelling urinary catheter is a thin tube that is placed into the bladder to help drain pee (urine) out of the body.  The catheter is placed into the part of the body that drains pee from the bladder (urethra).  Taking good care of your catheter will keep it working properly and help prevent problems.  Always wash your hands before and after touching your catheter or bag.  Never pull on your catheter or try to take it out. This information is not intended to replace advice given to you by your health care provider. Make sure you discuss any questions you have with your health care provider. Document Revised: 02/19/2019 Document Reviewed: 06/13/2017 Elsevier Patient Education  2020 [place]   1) The drugs that you were given will stay in your system until tomorrow so for the next 24 hours you should not:  A) Drive an automobile B) Make any legal decisions C) Drink any alcoholic beverage   2) You may resume regular meals tomorrow.  Today it is better to start with liquids and gradually work up to solid foods.  You may eat anything you prefer, but it is better to start with liquids, then soup and crackers, and gradually work up to solid foods.   3) Please notify your doctor immediately if you have any unusual bleeding, trouble breathing, redness and pain at the surgery site, drainage, fever, or pain not relieved by medication.    4) Additional Instructions:        Please contact your  physician with any  problems or Same Day Surgery at [phone], Monday through Friday 6 am to 4 pm, or [place] at [facility] number at [phone].

## 2020-09-14 NOTE — Transfer of Care (Signed)
Immediate Anesthesia Transfer of Care Note  Patient: [name]  Procedure(s) Performed: CYSTOSCOPY WITH DIRECT VISION INTERNAL URETHROTOMY WITH HOLM LASER (N/A )  Patient Location: PACU  Anesthesia Type:General  Level of Consciousness: drowsy  Airway & Oxygen Therapy: Patient Spontanous Breathing and Patient connected to face mask oxygen  Post-op Assessment: Report given to RN and Post -op Vital signs reviewed and stable  Post vital signs: Reviewed and stable  Last Vitals:  Vitals Value Taken Time  BP 142/70 09/14/20 1259  Temp 35.9 C 09/14/20 1259  Pulse 65 09/14/20 1259  Resp 16 09/14/20 1259  SpO2 100 % 09/14/20 1259    Last Pain:  Vitals:   09/14/20 1259  TempSrc:   PainSc: Asleep         Complications: No complications documented.

## 2020-09-14 NOTE — Progress Notes (Signed)
Silva Bandy, RN at bedside teaching catheter irrigation. Pt verbalizes understanding

## 2020-09-14 NOTE — Progress Notes (Signed)
Did Foley teaching to Clance Boll and Saralyn Pilar his partner. As  I was teaching I observed several clots showed the patient to let him see what to be on the look out for. They stated they understood so I d/c the patient.

## 2020-09-14 NOTE — Op Note (Signed)
Preoperative diagnosis: 1.  Urethral stricture disease (N35.014)                                             2.  Urethral calculus (N21.1)  Postoperative diagnosis: Same  Procedure: 1.  Visual internal urethrotomy (CPT 52275)                      2.  Litholapaxy of urethral calculus with holmium laser (CPT 52317)                      3.  Foley catheter placement (CPT [phone])  Surgeon: Otelia Limes. Yves Dill MD  Anesthesia: General  Indications:See the history and physical. After informed consent the above procedure(s) were requested     Technique and findings: After adequate general anesthesia had been obtained the patient was placed into dorsal lithotomy position and the perineum was prepped and draped in the usual fashion.  A 21 French cystoscope was coupled to the camera and advanced into the urethra.  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  Therefore the cystoscope was removed and the visual urethrotome advanced up to the stricture.  The stricture was incised seen the urethrotome at the 12 o'clock position.  Stricture was present on the mid pendulous urethra up to the bulbar urethra but did not include the bulbar urethra.  A 10 mm calculus was encountered in the prostatic fossa.  A 365 m holmium laser fiber was introduced through the scope and power set at 0.5 W and frequency of 10 Hz.  The stone was then fragmented into pieces smaller than 1 mm.  Bladder was then entered and thoroughly inspected.  No bladder tumors were identified.  The patient had a large posterior bladder diverticulum.  Both ureteral orifices were identified and had clear efflux.  The cystoscope was then removed and 10 cc of viscous Xylocaine instilled within the urethra.  A 24 French Foley catheter was placed and irrigated until clear.  B&O suppository was placed.  Blood loss was minimal.  Procedure was then terminated and patient transferred to the recovery room in stable  condition.

## 2020-09-14 NOTE — Progress Notes (Signed)
Inpatient Diabetes Program Recommendations  AACE/ADA: New Consensus Statement on Inpatient Glycemic Control   Target Ranges:  Prepandial:   less than 140 mg/dL      Peak postprandial:   less than 180 mg/dL (1-2 hours)      Critically ill patients:  140 - 180 mg/dL   Results for [name], [name] (MRN [id number]) as of 09/14/2020 11:37  Ref. Range 09/14/2020 10:13  Glucose-Capillary Latest Ref Range: 70 - 99 mg/dL 211 (H)  Results for [name], [name] (MRN [id number]) as of 09/14/2020 11:37  Ref. Range 09/13/2020 11:37  Glucose Latest Ref Range: 70 - 99 mg/dL 247 (H)   Review of Glycemic Control  Diabetes history: DM1 (makes NO insulin; requires basal, correction, and carbohydrate coverage insulin) Outpatient Diabetes medications: T-slim insulin pump with Novolog Current orders for Inpatient glycemic control: NONE: in Pre-Op 16 at this time for urology procedure today  Inpatient Diabetes Program Recommendations:    Insulin Pump: If patient is admitted, please use Insulin Pump order set and order CBGs ACHS&2am and insulin pump ACHS&2am if patient is allowed to use his insulin pump. If patient admitted and not allowed to use insulin pump, patient will need to have basal, correction, and meal coverage insulin ordered SQ.  NOTE: In reviewing chart, noted patient is currently in Pre-Op 16 for urology procedure today and glucose 211 mg/dl at 10:13 today. Noted in [place] that patient has DM1 and sees Dr.Solum Careers adviser) and was last seen 08/30/20. Per office note on 08/30/20 by Dr. Gabriel Carina the following should be patient's insulin pump settings:  Basal insulin  12A 0.85 units/hour 4A 0.75 units/hour 7:30A 1.1 units/hour 4P 1.2 units/hour Total daily basal insulin: 25 units/24 hours  Carb Coverage 12A 1:20 1 unit for every 20 grams of carbohydrates 4A 1:13 1 unit for every 13 grams of carbohydrates 7:30A 1:13 1 unit for every 13 grams of carbohydrates 4P 1:18 1 unit for  every 18 grams of carbohydrates  Insulin Sensitivity 1:40 1 unit drops blood glucose 40 mg/dl  Target Glucose Goals 120 mg/dl  Active insulin time: 5 hours  NURSING: If patient admitted and allow to use insulin pump; once insulin pump order set is ordered please print off the Patient insulin pump contract and flow sheet. The insulin pump contract should be signed by the patient and then placed in the chart. The patient insulin pump flow sheet will be completed by the patient at the bedside and the RN caring for the patient will use the patient's flow sheet to document in the [facility]. RN will need to complete the Nursing Insulin Pump Flowsheet at least once a shift. Patient will need to keep extra insulin pump supplies at the bedside at all times.   Thanks, Barnie Alderman, RN, MSN, CDE Diabetes Coordinator Inpatient Diabetes Program [phone] (Team Pager from 8am to 5pm)

## 2020-09-14 NOTE — H&P (Signed)
Date of Initial H&P: 09/13/20  History reviewed, patient examined, no change in status, stable for surgery.

## 2020-09-15 ENCOUNTER — Encounter: Payer: Self-pay | Admitting: Urology

## 2020-09-15 NOTE — Anesthesia Postprocedure Evaluation (Signed)
Anesthesia Post Note  Patient: [name]  Procedure(s) Performed: CYSTOSCOPY WITH DIRECT VISION INTERNAL URETHROTOMY WITH HOLM LASER (N/A )  Patient location during evaluation: PACU Anesthesia Type: General Level of consciousness: awake and alert Pain management: pain level controlled Vital Signs Assessment: post-procedure vital signs reviewed and stable Respiratory status: spontaneous breathing, nonlabored ventilation, respiratory function stable and patient connected to nasal cannula oxygen Cardiovascular status: blood pressure returned to baseline and stable Postop Assessment: no apparent nausea or vomiting Anesthetic complications: no   No complications documented.   Last Vitals:  Vitals:   09/14/20 1528 09/14/20 1636  BP:  136/68  Pulse: 84 89  Resp: 14 16  Temp:  (!) 36.2 C  SpO2: 96% 98%    Last Pain:  Vitals:   09/14/20 1636  TempSrc: Temporal  PainSc: 0-No pain                 Precious Haws [name]

## 2020-09-26 DIAGNOSIS — H2511 Age-related nuclear cataract, right eye: Secondary | ICD-10-CM | POA: Diagnosis not present

## 2020-09-26 DIAGNOSIS — E1159 Type 2 diabetes mellitus with other circulatory complications: Secondary | ICD-10-CM | POA: Diagnosis not present

## 2020-09-27 DIAGNOSIS — Z125 Encounter for screening for malignant neoplasm of prostate: Secondary | ICD-10-CM | POA: Diagnosis not present

## 2020-09-27 DIAGNOSIS — M4010 Other secondary kyphosis, site unspecified: Secondary | ICD-10-CM | POA: Diagnosis not present

## 2020-10-02 ENCOUNTER — Encounter: Payer: Self-pay | Admitting: Ophthalmology

## 2020-10-02 ENCOUNTER — Other Ambulatory Visit: Payer: Self-pay

## 2020-10-04 NOTE — Discharge Instructions (Signed)

## 2020-10-06 ENCOUNTER — Other Ambulatory Visit: Payer: Self-pay | Admitting: Cardiovascular Disease

## 2020-10-06 ENCOUNTER — Other Ambulatory Visit: Payer: Self-pay

## 2020-10-06 ENCOUNTER — Other Ambulatory Visit
Admission: RE | Admit: 2020-10-06 | Discharge: 2020-10-06 | Disposition: A | Discharge status: Home / Self Care | Payer: Medicare HMO | Source: Ambulatory Visit | Attending: Ophthalmology | Admitting: Ophthalmology

## 2020-10-06 DIAGNOSIS — Z20822 Contact with and (suspected) exposure to covid-19: Secondary | ICD-10-CM | POA: Diagnosis not present

## 2020-10-06 DIAGNOSIS — Z01812 Encounter for preprocedural laboratory examination: Secondary | ICD-10-CM | POA: Diagnosis not present

## 2020-10-06 LAB — SARS CORONAVIRUS 2 (TAT 6-24 HRS): SARS Coronavirus 2: NEGATIVE

## 2020-10-09 MED ORDER — TETRACAINE HCL 0.5 % OP SOLN
1.0000 [drp] | OPHTHALMIC | Status: DC | PRN
  Administered 2020-10-10 (×3): 1 [drp] via OPHTHALMIC

## 2020-10-09 MED ORDER — SODIUM CHLORIDE 0.9 % IV SOLN: INTRAVENOUS | Status: DC

## 2020-10-09 NOTE — Telephone Encounter (Signed)
Rx request sent to pharmacy.  

## 2020-10-10 ENCOUNTER — Ambulatory Visit: Payer: Self-pay | Admitting: Anesthesiology

## 2020-10-10 ENCOUNTER — Ambulatory Visit: Admission: RE | Admit: 2020-10-10 | Payer: Medicare HMO | Source: Home / Self Care | Admitting: Ophthalmology

## 2020-10-10 ENCOUNTER — Encounter: Payer: Self-pay | Admitting: Anesthesiology

## 2020-10-10 ENCOUNTER — Encounter: Payer: Self-pay | Admitting: Ophthalmology

## 2020-10-10 ENCOUNTER — Other Ambulatory Visit: Payer: Self-pay

## 2020-10-10 ENCOUNTER — Ambulatory Visit
Admission: RE | Admit: 2020-10-10 | Discharge: 2020-10-10 | Disposition: A | Discharge status: Home / Self Care | Payer: Medicare HMO | Attending: Ophthalmology | Admitting: Ophthalmology

## 2020-10-10 ENCOUNTER — Encounter
Admission: RE | Disposition: A | Discharge status: Home / Self Care | Payer: Self-pay | Source: Home / Self Care | Attending: Ophthalmology

## 2020-10-10 DIAGNOSIS — E785 Hyperlipidemia, unspecified: Secondary | ICD-10-CM | POA: Diagnosis not present

## 2020-10-10 DIAGNOSIS — Z794 Long term (current) use of insulin: Secondary | ICD-10-CM | POA: Diagnosis not present

## 2020-10-10 DIAGNOSIS — Z79899 Other long term (current) drug therapy: Secondary | ICD-10-CM | POA: Insufficient documentation

## 2020-10-10 DIAGNOSIS — Z87891 Personal history of nicotine dependence: Secondary | ICD-10-CM | POA: Diagnosis not present

## 2020-10-10 DIAGNOSIS — E1110 Type 2 diabetes mellitus with ketoacidosis without coma: Secondary | ICD-10-CM | POA: Diagnosis not present

## 2020-10-10 DIAGNOSIS — I10 Essential (primary) hypertension: Secondary | ICD-10-CM | POA: Diagnosis not present

## 2020-10-10 DIAGNOSIS — Z9641 Presence of insulin pump (external) (internal): Secondary | ICD-10-CM | POA: Diagnosis not present

## 2020-10-10 DIAGNOSIS — H2511 Age-related nuclear cataract, right eye: Secondary | ICD-10-CM | POA: Diagnosis not present

## 2020-10-10 HISTORY — PX: CATARACT EXTRACTION W/PHACO: SHX586

## 2020-10-10 LAB — GLUCOSE, CAPILLARY
Glucose-Capillary: 150 mg/dL — ABNORMAL HIGH (ref 70–99)
Glucose-Capillary: 134 mg/dL — ABNORMAL HIGH (ref 70–99)

## 2020-10-10 SURGERY — PHACOEMULSIFICATION, CATARACT, WITH IOL INSERTION
Anesthesia: Monitor Anesthesia Care | Site: Eye | Laterality: Right

## 2020-10-10 MED ORDER — MIDAZOLAM HCL 2 MG/2ML IJ SOLN
INTRAMUSCULAR | Status: AC
  Filled 2020-10-10: qty 2

## 2020-10-10 MED ORDER — LIDOCAINE HCL (PF) 2 % IJ SOLN
INTRAOCULAR | Status: DC | PRN
  Administered 2020-10-10: 2 mL

## 2020-10-10 MED ORDER — BRIMONIDINE TARTRATE-TIMOLOL 0.2-0.5 % OP SOLN
OPHTHALMIC | Status: DC | PRN
  Administered 2020-10-10: 1 [drp] via OPHTHALMIC

## 2020-10-10 MED ORDER — ATROPINE SULFATE 0.4 MG/ML IJ SOLN
INTRAMUSCULAR | Status: AC
  Filled 2020-10-10: qty 1

## 2020-10-10 MED ORDER — ARMC OPHTHALMIC DILATING DROPS
1.0000 "application " | OPHTHALMIC | Status: DC | PRN
  Administered 2020-10-10 (×3): 1 via OPHTHALMIC

## 2020-10-10 MED ORDER — DEXAMETHASONE SODIUM PHOSPHATE 10 MG/ML IJ SOLN
INTRAMUSCULAR | Status: AC
  Filled 2020-10-10: qty 1

## 2020-10-10 MED ORDER — FENTANYL CITRATE (PF) 100 MCG/2ML IJ SOLN
INTRAMUSCULAR | Status: DC | PRN
  Administered 2020-10-10: 25 ug via INTRAVENOUS

## 2020-10-10 MED ORDER — PHENYLEPHRINE HCL (PRESSORS) 10 MG/ML IV SOLN
INTRAVENOUS | Status: AC
  Filled 2020-10-10: qty 1

## 2020-10-10 MED ORDER — KETOROLAC TROMETHAMINE 30 MG/ML IJ SOLN
INTRAMUSCULAR | Status: AC
  Filled 2020-10-10: qty 1

## 2020-10-10 MED ORDER — FENTANYL CITRATE (PF) 100 MCG/2ML IJ SOLN
INTRAMUSCULAR | Status: AC
  Filled 2020-10-10: qty 2

## 2020-10-10 MED ORDER — MOXIFLOXACIN HCL 0.5 % OP SOLN
OPHTHALMIC | Status: DC | PRN
  Administered 2020-10-10: 0.2 mL via OPHTHALMIC

## 2020-10-10 MED ORDER — EPINEPHRINE PF 1 MG/ML IJ SOLN
INTRAOCULAR | Status: DC | PRN
  Administered 2020-10-10: 114 mL via OPHTHALMIC

## 2020-10-10 MED ORDER — PROPOFOL 10 MG/ML IV BOLUS
INTRAVENOUS | Status: AC
  Filled 2020-10-10: qty 20

## 2020-10-10 MED ORDER — NA CHONDROIT SULF-NA HYALURON 40-17 MG/ML IO SOLN
INTRAOCULAR | Status: DC | PRN
  Administered 2020-10-10: 1 mL via INTRAOCULAR

## 2020-10-10 MED ORDER — ONDANSETRON HCL 4 MG/2ML IJ SOLN
INTRAMUSCULAR | Status: AC
  Filled 2020-10-10: qty 2

## 2020-10-10 MED ORDER — SEVOFLURANE IN SOLN
RESPIRATORY_TRACT | Status: AC
  Filled 2020-10-10: qty 250

## 2020-10-10 MED ORDER — GLYCOPYRROLATE 0.2 MG/ML IJ SOLN
INTRAMUSCULAR | Status: AC
  Filled 2020-10-10: qty 1

## 2020-10-10 MED ORDER — MIDAZOLAM HCL 2 MG/2ML IJ SOLN
INTRAMUSCULAR | Status: DC | PRN
  Administered 2020-10-10: 1 mg via INTRAVENOUS

## 2020-10-10 SURGICAL SUPPLY — 19 items
CANNULA ANT/CHMB 27G (MISCELLANEOUS) IMPLANT
CANNULA ANT/CHMB 27GA (MISCELLANEOUS) ×6 IMPLANT
GLOVE BIOGEL M 6.5 STRL (GLOVE) ×3 IMPLANT
GLOVE SURG LX 8.0 MICRO (GLOVE) ×4
GLOVE SURG LX STRL 8.0 MICRO (GLOVE) ×1 IMPLANT
GOWN STRL REUS W/ TWL LRG LVL3 (GOWN DISPOSABLE) ×2 IMPLANT
GOWN STRL REUS W/TWL LRG LVL3 (GOWN DISPOSABLE) ×6
LENS IOL TECNIS EYHANCE 22.0 (Intraocular Lens) ×2 IMPLANT
MARKER SKIN DUAL TIP RULER LAB (MISCELLANEOUS) ×2 IMPLANT
NDL FILTER BLUNT 18X1 1/2 (NEEDLE) IMPLANT
NEEDLE FILTER BLUNT 18X 1/2SAF (NEEDLE) ×2
NEEDLE FILTER BLUNT 18X1 1/2 (NEEDLE) ×1 IMPLANT
PACK CATARACT (MISCELLANEOUS) ×3 IMPLANT
PACK CATARACT BRASINGTON LX (MISCELLANEOUS) ×3 IMPLANT
PACK EYE AFTER SURG (MISCELLANEOUS) ×3 IMPLANT
SYR 3ML LL SCALE MARK (SYRINGE) ×2 IMPLANT
SYR TB 1ML LUER SLIP (SYRINGE) ×2 IMPLANT
WATER STERILE IRR 250ML POUR (IV SOLUTION) ×3 IMPLANT
WIPE NON LINTING 3.25X3.25 (MISCELLANEOUS) ×3 IMPLANT

## 2020-10-10 NOTE — Anesthesia Preprocedure Evaluation (Signed)
Anesthesia Evaluation  Patient identified by MRN, date of birth, ID band Patient awake    Reviewed: Allergy & Precautions, NPO status , Patient's Chart, lab work & pertinent test results  Airway Mallampati: II       Dental no notable dental hx.    Pulmonary neg pulmonary ROS, former smoker,    Pulmonary exam normal breath sounds clear to auscultation       Cardiovascular hypertension, + CAD, + Past MI and + Peripheral Vascular Disease  Normal cardiovascular exam Rhythm:Regular Rate:Normal     Neuro/Psych negative neurological ROS  negative psych ROS   GI/Hepatic Neg liver ROS, GERD  ,  Endo/Other  negative endocrine ROSdiabetes  Renal/GU negative Renal ROS  negative genitourinary   Musculoskeletal negative musculoskeletal ROS (+)   Abdominal   Peds negative pediatric ROS (+)  Hematology negative hematology ROS (+)   Anesthesia Other Findings .Marland KitchenPast Medical History: No date: Anemia No date: Arthritis     Comment:  right knee No date: Cancer [facility])     Comment:  BLADDER CANCER (2018)skin cancer 04/2020: Cataract No date: Coronary artery disease No date: Diabetes mellitus No date: Edema No date: GERD (gastroesophageal reflux disease)     Comment:  rare-NO MEDS 2007: Heart attack ([place]) No date: Hyperlipidemia No date: Hypertension No date: Hypoglycemia     Comment:  history of episodes x2 No date: Ischemic cardiomyopathy No date: Myocardial infarction ([place]) No date: Peripheral vascular disease ([name])     Comment:  swelling in Lt ankle due to prior injury   Reproductive/Obstetrics negative OB ROS                             Anesthesia Physical Anesthesia Plan  ASA: III  Anesthesia Plan: MAC   Post-op Pain Management:    Induction: Intravenous  PONV Risk Score and Plan: 1  Airway Management Planned: Nasal Cannula  Additional Equipment:   Intra-op Plan:    Post-operative Plan:   Informed Consent: I have reviewed the patients History and Physical, chart, labs and discussed the procedure including the risks, benefits and alternatives for the proposed anesthesia with the patient or authorized representative who has indicated his/her understanding and acceptance.       Plan Discussed with: CRNA, Anesthesiologist and Surgeon  Anesthesia Plan Comments:         Anesthesia Quick Evaluation

## 2020-10-10 NOTE — H&P (Signed)
[facility]   Primary Care Physician:  [name], MD Ophthalmologist: Dr. George Ina  Pre-Procedure History & Physical: HPI:  [name] is a 74 y.o. male here for cataract surgery.   Past Medical History:  Diagnosis Date  . Anemia   . Arthritis    right knee  . Cancer [facility])    BLADDER CANCER (2018)skin cancer  . Cataract 04/2020  . Coronary artery disease   . Diabetes mellitus   . Edema   . GERD (gastroesophageal reflux disease)    rare-NO MEDS  . Heart attack ([place]) 2007  . Hyperlipidemia   . Hypertension   . Hypoglycemia    history of episodes x2  . Ischemic cardiomyopathy   . Myocardial infarction ([place])   . Peripheral vascular disease ([name])    swelling in Lt ankle due to prior injury    Past Surgical History:  Procedure Laterality Date  . CARDIAC CATHETERIZATION    . CATARACT EXTRACTION W/PHACO Left 04/18/2020   Procedure: CATARACT EXTRACTION PHACO AND INTRAOCULAR LENS PLACEMENT (IOC) LEFT DIABETIC 14.60  01:57.1;  Surgeon: Birder Robson, MD;  Location: [place];  Service: Ophthalmology;  Laterality: Left;  Diabetic - insulin pump  . COLONOSCOPY WITH PROPOFOL N/A 02/18/2018   Procedure: COLONOSCOPY WITH PROPOFOL;  Surgeon: Toledo, Benay Pike, MD;  Location: [facility];  Service: Gastroenterology;  Laterality: N/A;  . CORONARY ANGIOPLASTY    . CORONARY STENT PLACEMENT     To an occluded LAD x2  . CYSTOSCOPY WITH BIOPSY N/A 10/21/2017   Procedure: CYSTOSCOPY WITH BLADDER BIOPSY;  Surgeon: Royston Cowper, MD;  Location: [facility];  Service: Urology;  Laterality: N/A;  . CYSTOSCOPY WITH BIOPSY N/A 12/08/2018   Procedure: CYSTOSCOPY WITH BLADDER BIOPSY-MITOMYCIN;  Surgeon: Royston Cowper, MD;  Location: [facility];  Service: Urology;  Laterality: N/A;  . CYSTOSCOPY WITH DIRECT VISION INTERNAL URETHROTOMY  03/02/2020   Procedure: CYSTOSCOPY WITH DIRECT VISION INTERNAL URETHROTOMY;  Surgeon: Royston Cowper, MD;  Location: [place] [facility];   Service: Urology;;  . Consuela Mimes WITH DIRECT VISION INTERNAL URETHROTOMY N/A 09/14/2020   Procedure: CYSTOSCOPY WITH DIRECT VISION INTERNAL URETHROTOMY WITH HOLM LASER;  Surgeon: Royston Cowper, MD;  Location: [facility];  Service: Urology;  Laterality: N/A;  . CYSTOSCOPY WITH HOLMIUM LASER LITHOTRIPSY N/A 05/08/2020   Procedure: CYSTOSCOPY WITH HOLMIUM LASER LITHOTRIPSY;  Surgeon: Royston Cowper, MD;  Location: [facility];  Service: Urology;  Laterality: N/A;  . EXCISION MASS LOWER EXTREMETIES Right 06/05/2016   Procedure: EXCISION OF LESION RIGHT LOWER LEG;  Surgeon: Hubbard Robinson, MD;  Location: [facility];  Service: General;  Laterality: Right;  . GREEN LIGHT LASER TURP (TRANSURETHRAL RESECTION OF PROSTATE N/A 08/12/2019   Procedure: GREEN LIGHT LASER TURP (TRANSURETHRAL RESECTION OF PROSTATE, BLADDER BIOPSY;  Surgeon: Royston Cowper, MD;  Location: [facility];  Service: Urology;  Laterality: N/A;  . HOLMIUM LASER APPLICATION  8/54/6270   Procedure: HOLMIUM LASER APPLICATION;  Surgeon: Royston Cowper, MD;  Location: [facility];  Service: Urology;;  Urethral stone   . SKIN CANCER EXCISION     chest  . TRANSURETHRAL RESECTION OF BLADDER TUMOR N/A 07/15/2017   Procedure: TRANSURETHRAL RESECTION OF BLADDER TUMOR (TURBT);  Surgeon: Royston Cowper, MD;  Location: [facility];  Service: Urology;  Laterality: N/A;  . URETERAL BIOPSY N/A 08/12/2019   Procedure: URETERAL BIOPSY;  Surgeon: Royston Cowper, MD;  Location: [facility];  Service: Urology;  Laterality: N/A;  . URETHROTOMY N/A 05/08/2020  Procedure: CYSTOSCOPY/URETHROTOMY;  Surgeon: Royston Cowper, MD;  Location: [facility];  Service: Urology;  Laterality: N/A;    Prior to Admission medications   Medication Sig Start Date End Date Taking? Authorizing Provider  aspirin EC 81 MG tablet Take 81 mg by mouth daily.   Yes [provider]  atorvastatin (LIPITOR) 80 MG tablet Take 1 tablet (80 mg total) by mouth daily. Please schedule office  visit for further refills. Thank you! Patient taking differently: Take 80 mg by mouth every evening. Please schedule office visit for further refills. Thank you! 06/05/20  Yes [name], MD  carvedilol (COREG) 12.5 MG tablet Take 1 tablet (12.5 mg total) by mouth 2 (two) times daily with a meal. Please keep your upcoming appointment for refills. 10/09/20  Yes [name], MD  docusate sodium (COLACE) 100 MG capsule Take 2 capsules (200 mg total) by mouth 2 (two) times daily. Patient taking differently: Take 100 mg by mouth daily.  08/12/19  Yes Royston Cowper, MD  glucagon (GLUCAGON EMERGENCY) 1 MG injection Inject 1 mg into the skin once as needed (for low blood sugar).    Yes [provider]  Insulin Human (INSULIN PUMP) SOLN Inject into the skin.   Yes [provider]  losartan (COZAAR) 50 MG tablet Take 50 mg by mouth once a week.    Yes [provider]  Meth-Hyo-M Bl-Na Phos-Ph Sal (URIBEL) 118 MG CAPS Take 1 capsule (118 mg total) by mouth every 6 (six) hours as needed (dysuria). 08/12/19  Yes Royston Cowper, MD  Multiple Vitamin (MULTIVITAMIN WITH MINERALS) TABS tablet Take 1 tablet by mouth daily.   Yes [provider]  naproxen sodium (ALEVE) 220 MG tablet Take 220-440 mg by mouth 2 (two) times daily as needed (pain/headaches.).   Yes [provider]  NOVOLOG 100 UNIT/ML injection See admin instructions. Uses via inuslin pump 03/14/16  Yes [provider]  vitamin B-12 (CYANOCOBALAMIN) 1000 MCG tablet Take 1,000 mcg by mouth 2 (two) times a week.    Yes [provider]    Allergies as of 08/21/2020  . (No Known Allergies)    Family History  Problem Relation Age of Onset  . Heart disease Mother   . Heart attack Mother   . Heart disease Father   . Prostate cancer Brother     Social History   Socioeconomic History  . Marital status: Single    Spouse name: Not on file  . Number of children: Not on file   . Years of education: Not on file  . Highest education level: Not on file  Occupational History  . Occupation: Retired    Fish farm manager: RETIRED  Tobacco Use  . Smoking status: Former Smoker    Packs/day: 1.00    Years: 10.00    Pack years: 10.00    Types: Cigarettes    Quit date: 06/08/1995    Years since quitting: 25.3  . Smokeless tobacco: Never Used  Vaping Use  . Vaping Use: Never used  Substance and Sexual Activity  . Alcohol use: No    Alcohol/week: 0.0 standard drinks  . Drug use: No  . Sexual activity: Not on file  Other Topics Concern  . Not on file  Social History Narrative   Pt gets regular exercise.   Social Determinants of Health   Financial Resource Strain:   . Difficulty of Paying Living Expenses: Not on file  Food Insecurity:   . Worried About  Running Out of Food in the Last Year: Not on file  . Ran Out of Food in the Last Year: Not on file  Transportation Needs:   . Lack of Transportation (Medical): Not on file  . Lack of Transportation (Non-Medical): Not on file  Physical Activity:   . Days of Exercise per Week: Not on file  . Minutes of Exercise per Session: Not on file  Stress:   . Feeling of Stress : Not on file  Social Connections:   . Frequency of Communication with Friends and Family: Not on file  . Frequency of Social Gatherings with Friends and Family: Not on file  . Attends Religious Services: Not on file  . Active Member of Clubs or Organizations: Not on file  . Attends Archivist Meetings: Not on file  . Marital Status: Not on file  Intimate Partner Violence:   . Fear of Current or Ex-Partner: Not on file  . Emotionally Abused: Not on file  . Physically Abused: Not on file  . Sexually Abused: Not on file    Review of Systems: See HPI, otherwise negative ROS  Physical Exam: BP (!) 132/94   Pulse 71   Temp 97.9 F (36.6 C) (Temporal)   Resp 16   Ht 5\' 8"  (1.727 m)   Wt 81.6 kg   SpO2 100%   BMI 27.37 kg/m  General:    Alert,  pleasant and cooperative in NAD Head:  Normocephalic and atraumatic. Respiratory:  Normal work of breathing. Heart:  Regular rate and rhythm.   Impression/Plan: [name] is here for cataract surgery.  Risks, benefits, limitations, and alternatives regarding cataract surgery have been reviewed with the patient.  Questions have been answered.  All parties agreeable.   Birder Robson, MD  10/10/2020, 7:24 AM

## 2020-10-10 NOTE — Op Note (Signed)
PREOPERATIVE DIAGNOSIS:  Nuclear sclerotic cataract of the right eye.   POSTOPERATIVE DIAGNOSIS:  Cataract   OPERATIVE PROCEDURE: Procedure(s): CATARACT EXTRACTION PHACO AND INTRAOCULAR LENS PLACEMENT (IOC) RIGHT DIABETIC 12.59 01:44.7   SURGEON:  Birder Robson, MD.   ANESTHESIA:  Anesthesiologist: Neva Seat, DO CRNA: Rolla Plate, CRNA Student Nurse Anesthetist: Markus Daft, RN  1.      Managed anesthesia care. 2.      0.59ml of Shugarcaine was instilled in the eye following the paracentesis.   COMPLICATIONS:  None.   TECHNIQUE:   Stop and chop   DESCRIPTION OF PROCEDURE:  The patient was examined and consented in the preoperative holding area where the aforementioned topical anesthesia was applied to the right eye and then brought back to the Operating Room where the right eye was prepped and draped in the usual sterile ophthalmic fashion and a lid speculum was placed. A paracentesis was created with the side port blade and the anterior chamber was filled with viscoelastic. A near clear corneal incision was performed with the steel keratome. A continuous curvilinear capsulorrhexis was performed with a cystotome followed by the capsulorrhexis forceps. Hydrodissection and hydrodelineation were carried out with BSS on a blunt cannula. The lens was removed in a stop and chop  technique and the remaining cortical material was removed with the irrigation-aspiration handpiece. The capsular bag was inflated with viscoelastic and the Technis DIB00  lens was placed in the capsular bag without complication. The remaining viscoelastic was removed from the eye with the irrigation-aspiration handpiece. The wounds were hydrated. The anterior chamber was flushed with BSS and the eye was inflated to physiologic pressure. 0.68ml of Vigamox was placed in the anterior chamber. The wounds were found to be water tight. The eye was dressed with Vigamox. The patient was given protective glasses to  wear throughout the day and a shield with which to sleep tonight. The patient was also given drops with which to begin a drop regimen today and will follow-up with me in one day. Implant Name Type Inv. Item Serial No. Manufacturer Lot No. LRB No. Used Action  Tecnis Eyhance IOL Intraocular Lens  [id number] Wynetta Emery [employer]  Right 1 Implanted   Procedure(s) with comments: CATARACT EXTRACTION PHACO AND INTRAOCULAR LENS PLACEMENT (IOC) RIGHT DIABETIC 12.59 01:44.7 (Right) - Diabetic - insulin pump  Electronically signed: Birder Robson 10/10/2020 7:58 AM

## 2020-10-10 NOTE — Anesthesia Postprocedure Evaluation (Signed)
Anesthesia Post Note  Patient: [name]  Procedure(s) Performed: CATARACT EXTRACTION PHACO AND INTRAOCULAR LENS PLACEMENT (IOC) RIGHT DIABETIC 12.59 01:44.7 (Right Eye)  Patient location during evaluation: Short Stay Anesthesia Type: MAC Level of consciousness: awake and alert Pain management: pain level controlled Vital Signs Assessment: post-procedure vital signs reviewed and stable Respiratory status: spontaneous breathing, nonlabored ventilation, respiratory function stable and patient connected to nasal cannula oxygen Cardiovascular status: stable and blood pressure returned to baseline Postop Assessment: no apparent nausea or vomiting Anesthetic complications: no   No complications documented.   Last Vitals:  Vitals:   10/10/20 0759 10/10/20 0801  BP: 140/73 140/73  Pulse: 64 65  Resp: 20 16  Temp: 36.4 C 36.7 C  SpO2: 100% 100%    Last Pain:  Vitals:   10/10/20 0759  TempSrc:   PainSc: 0-No pain                 Rolla Plate P

## 2020-10-10 NOTE — Transfer of Care (Signed)
Immediate Anesthesia Transfer of Care Note  Patient: [name]  Procedure(s) Performed: CATARACT EXTRACTION PHACO AND INTRAOCULAR LENS PLACEMENT (IOC) RIGHT DIABETIC 12.59 01:44.7 (Right Eye)  Patient Location: Short Stay  Anesthesia Type:MAC  Level of Consciousness: awake, alert  and oriented  Airway & Oxygen Therapy: Patient Spontanous Breathing  Post-op Assessment: Report given to RN and Post -op Vital signs reviewed and stable  Post vital signs: Reviewed  Last Vitals:  Vitals Value Taken Time  BP 140/73 10/10/20 0801  Temp 36.7 C 10/10/20 0801  Pulse 65 10/10/20 0801  Resp 16 10/10/20 0801  SpO2 100 % 10/10/20 0801    Last Pain:  Vitals:   10/10/20 0623  TempSrc: Temporal  PainSc: 0-No pain         Complications: No complications documented.

## 2020-10-12 DIAGNOSIS — E1065 Type 1 diabetes mellitus with hyperglycemia: Secondary | ICD-10-CM | POA: Diagnosis not present

## 2020-10-12 DIAGNOSIS — E109 Type 1 diabetes mellitus without complications: Secondary | ICD-10-CM | POA: Diagnosis not present

## 2020-10-26 DIAGNOSIS — N401 Enlarged prostate with lower urinary tract symptoms: Secondary | ICD-10-CM | POA: Diagnosis not present

## 2020-11-07 DIAGNOSIS — R300 Dysuria: Secondary | ICD-10-CM | POA: Diagnosis not present

## 2020-11-07 DIAGNOSIS — R3129 Other microscopic hematuria: Secondary | ICD-10-CM | POA: Diagnosis not present

## 2020-11-07 DIAGNOSIS — R301 Vesical tenesmus: Secondary | ICD-10-CM | POA: Diagnosis not present

## 2020-11-07 DIAGNOSIS — N35011 Post-traumatic bulbous urethral stricture: Secondary | ICD-10-CM | POA: Diagnosis not present

## 2020-11-07 DIAGNOSIS — Z8551 Personal history of malignant neoplasm of bladder: Secondary | ICD-10-CM | POA: Diagnosis not present

## 2020-11-13 DIAGNOSIS — E103312 Type 1 diabetes mellitus with moderate nonproliferative diabetic retinopathy with macular edema, left eye: Secondary | ICD-10-CM | POA: Diagnosis not present

## 2020-11-13 NOTE — Progress Notes (Signed)
Patient ID: [name], male   DOB: 1946-03-21, 75 y.o.   MRN: [id number] Cardiology Office Note  Date:  11/15/2020   ID:  AVIAN KONIGSBERG, DOB 28-Oct-1946, MRN [id number]  PCP:  [name], MD   Chief Complaint  Patient presents with  . Other    12 month follow up. Meds reviewed verbally with patient.     HPI:  Mr. Ptacek is a very pleasant 75 year old gentleman with a history of coronary artery disease,  occluded LAD with stent placed February 2008 ( Cypher 3.0 x 8 mm DES stent),   previous angina was described as posterior neck pain.   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  On previous visits, he reported having night sweats. We held his carvedilol and Lipitor. Night sweats did not improve. told he had infection in his gums and has had significant work done. night sweats have improved    Cardiac catheter report from 2008 details 25% ostial left main, 100% mid LAD with stent placed at this location,40% proximal circumflex, 20% PL vessel disease Previous stress test showing scar in the mid to  distal anterior wall. Previous lab work; Hemoglobin A1c 8.2, total cholesterol 134, LDL 65  PMH:   has a past medical history of Anemia, Arthritis, Cancer ([place]), Cataract (04/2020), Coronary artery disease, Diabetes mellitus, Edema, GERD (gastroesophageal reflux disease), Heart attack ([place]) (2007), Hyperlipidemia, Hypertension, Hypoglycemia, Ischemic cardiomyopathy, Myocardial infarction [facility]), and Peripheral vascular disease ([place]).  PSH:    Past Surgical History:  Procedure Laterality Date  . CARDIAC CATHETERIZATION    . CATARACT EXTRACTION W/PHACO Left 04/18/2020   Procedure: CATARACT EXTRACTION PHACO AND INTRAOCULAR LENS PLACEMENT (IOC) LEFT DIABETIC 14.60  01:57.1;  Surgeon: Birder Robson, MD;  Location: [facility];  Service: Ophthalmology;  Laterality: Left;  Diabetic - insulin pump  . CATARACT EXTRACTION W/PHACO Right 10/10/2020   Procedure: CATARACT EXTRACTION PHACO AND INTRAOCULAR LENS PLACEMENT (IOC) RIGHT DIABETIC 12.59 01:44.7;  Surgeon: Birder Robson, MD;  Location: [facility];  Service: Ophthalmology;  Laterality: Right;  Diabetic - insulin pump  . COLONOSCOPY WITH PROPOFOL N/A 02/18/2018   Procedure: COLONOSCOPY WITH PROPOFOL;  Surgeon: Toledo, Benay Pike, MD;  Location: [facility];  Service: Gastroenterology;  Laterality: N/A;  . CORONARY ANGIOPLASTY    . CORONARY STENT PLACEMENT     To an occluded LAD x2  . CYSTOSCOPY WITH BIOPSY N/A 10/21/2017   Procedure: CYSTOSCOPY WITH BLADDER BIOPSY;  Surgeon: Royston Cowper, MD;  Location: [facility];  Service: Urology;  Laterality: N/A;  . CYSTOSCOPY WITH BIOPSY N/A 12/08/2018   Procedure: CYSTOSCOPY WITH BLADDER BIOPSY-MITOMYCIN;  Surgeon: Royston Cowper,  MD;  Location: [facility];  Service: Urology;  Laterality: N/A;  . CYSTOSCOPY WITH DIRECT VISION INTERNAL URETHROTOMY  03/02/2020   Procedure: CYSTOSCOPY WITH DIRECT VISION INTERNAL URETHROTOMY;  Surgeon: Royston Cowper, MD;  Location: [facility];  Service: Urology;;  .  Consuela Mimes WITH DIRECT VISION INTERNAL URETHROTOMY N/A 09/14/2020   Procedure: CYSTOSCOPY WITH DIRECT VISION INTERNAL URETHROTOMY WITH HOLM LASER;  Surgeon: Royston Cowper, MD;  Location: [facility];  Service: Urology;  Laterality: N/A;  . CYSTOSCOPY WITH HOLMIUM LASER LITHOTRIPSY N/A 05/08/2020   Procedure: CYSTOSCOPY WITH HOLMIUM LASER LITHOTRIPSY;  Surgeon: Royston Cowper, MD;  Location: [facility];  Service: Urology;  Laterality: N/A;  . EXCISION MASS LOWER EXTREMETIES Right 06/05/2016   Procedure: EXCISION OF LESION RIGHT LOWER LEG;  Surgeon: Hubbard Robinson, MD;  Location: [facility];  Service: General;  Laterality: Right;  . GREEN LIGHT LASER TURP (TRANSURETHRAL RESECTION OF PROSTATE N/A 08/12/2019   Procedure: GREEN LIGHT LASER TURP (TRANSURETHRAL RESECTION OF PROSTATE, BLADDER BIOPSY;  Surgeon: Royston Cowper, MD;  Location: [facility];  Service: Urology;  Laterality: N/A;  . HOLMIUM LASER APPLICATION  01/30/2247   Procedure: HOLMIUM LASER APPLICATION;  Surgeon: Royston Cowper, MD;  Location: [facility];  Service: Urology;;  Urethral stone   . SKIN CANCER EXCISION     chest  . TRANSURETHRAL RESECTION OF BLADDER TUMOR N/A 07/15/2017   Procedure: TRANSURETHRAL RESECTION OF BLADDER TUMOR (TURBT);  Surgeon: Royston Cowper, MD;  Location: [facility];  Service: Urology;  Laterality: N/A;  . URETERAL BIOPSY N/A 08/12/2019   Procedure: URETERAL BIOPSY;  Surgeon: Royston Cowper, MD;  Location: [facility];  Service: Urology;  Laterality: N/A;  . URETHROTOMY N/A 05/08/2020   Procedure: CYSTOSCOPY/URETHROTOMY;  Surgeon: Royston Cowper, MD;  Location: [facility];  Service: Urology;  Laterality: N/A;    Current Outpatient Medications  Medication Sig Dispense Refill  . aspirin EC 81 MG tablet Take 81 mg by mouth daily.    Marland Kitchen atorvastatin (LIPITOR) 80 MG tablet Take 1 tablet (80 mg total) by mouth daily. Please schedule office visit for further refills. Thank you! 90 tablet 0  . docusate sodium (COLACE) 100  MG capsule Take 2 capsules (200 mg total) by mouth 2 (two) times daily. 120 capsule 3  . glucagon 1 MG injection Inject 1 mg into the skin once as needed (for low blood sugar).     . Insulin Human (INSULIN PUMP) SOLN Inject into the skin.    . Meth-Hyo-M Bl-Na Phos-Ph Sal (URIBEL) 118 MG CAPS Take 1 capsule (118 mg total) by mouth every 6 (six) hours as needed (dysuria). 40 capsule 3  . Multiple Vitamin (MULTIVITAMIN WITH MINERALS) TABS tablet Take 1 tablet by mouth daily.    . naproxen sodium (ALEVE) 220 MG tablet Take 220-440 mg by mouth 2 (two) times daily as needed (pain/headaches.).    Marland Kitchen NOVOLOG 100 UNIT/ML injection See admin instructions. Uses via inuslin pump  0  . vitamin B-12 (CYANOCOBALAMIN) 1000 MCG tablet Take 1,000 mcg by mouth 2 (two) times a week.     . carvedilol (COREG) 6.25 MG tablet Take 1 tablet (6.25 mg total) by mouth 2 (two) times daily with a meal. Please keep your upcoming appointment for refills. 180 tablet 3  . losartan (COZAAR) 50 MG tablet Take 0.5 tablets (25 mg total) by mouth every other day. 23 tablet 3   No current facility-administered medications for this visit.     Allergies:  Patient has no known allergies.   Social History:  The patient  reports that he quit smoking about 25 years ago. His smoking use included cigarettes. He has a 10.00 pack-year smoking history. He has never used smokeless tobacco. He reports that he does not drink alcohol and does not use drugs.   Family History:   family history includes Heart attack in his mother; Heart disease in his father and mother; Prostate cancer in his brother.    Review of Systems: Review of Systems  Constitutional: Negative.   Respiratory: Negative.   Cardiovascular: Negative.   Gastrointestinal: Negative.   Musculoskeletal: Negative.   Neurological: Negative.   Psychiatric/Behavioral: Negative.   All other systems reviewed and are negative.   PHYSICAL EXAM: VS:  BP 120/70 (BP Location: Left  Arm, Patient Position: Sitting, Cuff Size: Normal)   Pulse 72   Ht 5' 9"  (1.753 m)   Wt 185 lb (83.9 kg)   SpO2 98%   BMI 27.32 kg/m  , BMI Body mass index is 27.32 kg/m. Constitutional:  oriented to person, place, and time. No distress.  HENT:  Head: Grossly normal Eyes:  no discharge. No scleral icterus.  Neck: No JVD, no carotid bruits  Cardiovascular: Regular rate and rhythm, no murmurs appreciated 1-2+ pitting lower extremity edema Pulmonary/Chest: Clear to auscultation bilaterally, no wheezes or rails Abdominal: Soft.  no distension.  no tenderness.  Musculoskeletal: Normal range of motion Neurological:  normal muscle tone. Coordination normal. No atrophy Skin: Skin warm and dry Psychiatric: normal affect, pleasant   Recent Labs: 09/13/2020: BUN 19; Creatinine, Ser 0.95; Hemoglobin 10.6; Platelets 243; Potassium 4.4; Sodium 142    Lipid Panel Lab Results  Component Value Date   CHOL 101 06/08/2018   HDL 57 06/08/2018   LDLCALC 37 06/08/2018   TRIG 34 06/08/2018      Wt Readings from Last 3 Encounters:  11/15/20 185 lb (83.9 kg)  10/10/20 180 lb (81.6 kg)  09/14/20 180 lb (81.6 kg)      ASSESSMENT AND PLAN:  Atherosclerosis of native coronary artery of native heart without angina pectoris - Plan: EKG 12-Lead Known CAD,  stent x2 seen in the LAD  mild circumflex and RCA calcification -Cholesterol at goal,  current non-smoker A1c trending upwards, recommend he continue to work on his diet, exercise  HYPERTENSION, BENIGN - Plan: EKG 12-Lead Changes as below  coreg 6.25 twice a day losartan 25 mg every other day May be able to titrate up on the losartan to daily if blood pressure stable  Hyperlipidemia Cholesterol is at goal on the current lipid regimen. No changes to the medications were made.  Controlled type 2 diabetes mellitus with other circulatory complication, without long-term current use of insulin ([name])  hemoglobin A1c 7 to 8 He has had  diabetes for 50 years Followed by endocrinology He is working hard on exercise, diet  Lymphedema Suggested he see Dr. Dew/Dr. Ronalee Belts Wears compressions Avoid calcium channel blockers   Total encounter time more than 25 minutes  Greater than 50% was spent in counseling and coordination of care with the patient     Orders Placed This Encounter  Procedures  . EKG 12-Lead    Total encounter time more than 15 minutes  Greater than 50% was spent in counseling and coordination of care with the patient   Signed, Esmond Plants, M.D., Ph.D. 11/15/2020  [place], [place]

## 2020-11-15 ENCOUNTER — Encounter: Payer: Self-pay | Admitting: Cardiovascular Disease

## 2020-11-15 ENCOUNTER — Other Ambulatory Visit: Payer: Self-pay

## 2020-11-15 ENCOUNTER — Ambulatory Visit: Payer: Medicare HMO | Admitting: Cardiovascular Disease

## 2020-11-15 ENCOUNTER — Telehealth: Payer: Self-pay | Admitting: Cardiovascular Disease

## 2020-11-15 VITALS — BP 120/70 | HR 72 | Ht 69.0 in | Wt 185.0 lb

## 2020-11-15 DIAGNOSIS — E782 Mixed hyperlipidemia: Secondary | ICD-10-CM

## 2020-11-15 DIAGNOSIS — I25118 Atherosclerotic heart disease of native coronary artery with other forms of angina pectoris: Secondary | ICD-10-CM

## 2020-11-15 DIAGNOSIS — R600 Localized edema: Secondary | ICD-10-CM | POA: Diagnosis not present

## 2020-11-15 DIAGNOSIS — I10 Essential (primary) hypertension: Secondary | ICD-10-CM

## 2020-11-15 DIAGNOSIS — E1159 Type 2 diabetes mellitus with other circulatory complications: Secondary | ICD-10-CM | POA: Diagnosis not present

## 2020-11-15 MED ORDER — CARVEDILOL 6.25 MG PO TABS
6.2500 mg | ORAL_TABLET | Freq: Two times a day (BID) | ORAL | 3 refills | Status: DC
Start: 2020-11-15 — End: 2022-02-13

## 2020-11-15 MED ORDER — LOSARTAN POTASSIUM 50 MG PO TABS
25.0000 mg | ORAL_TABLET | ORAL | 3 refills | Status: DC
Start: 2020-11-15 — End: 2022-10-14

## 2020-11-15 NOTE — Telephone Encounter (Signed)
Patient sees error on avs.  He states he is a Type 1 diabetic and note states type 2 .  Please update record per patient request.

## 2020-11-15 NOTE — Patient Instructions (Addendum)
Medication Instructions:   Losartan 25mg  take every other day  Coreg 6.25mg  two times a day  If you need a refill on your cardiac medications before your next appointment, please call your pharmacy.    Lab work: No new labs needed   If you have labs (blood work) drawn today and your tests are completely normal, you will receive your results only by: MyChart Message (if you have MyChart) OR . A paper copy in the mail If you have any lab test that is abnormal or we need to change your treatment, we will call you to review the results.   Testing/Procedures: No new testing needed   Follow-Up: At [facility], you and your health needs are our priority.  As part of our continuing mission to provide you with exceptional heart care, we have created designated Provider Care Teams.  These Care Teams include your primary Cardiologist (physician) and Advanced Practice Providers (APPs -  Physician Assistants and Nurse Practitioners) who all work together to provide you with the care you need, when you need it.  . You will need a follow up appointment in 12 months  . Providers on your designated Care Team:   . [facility], NP . Nicolasa Ducking, [name] . Eula Listen, [name]  Any Other Special Instructions Will Be Listed Below (If Applicable).  COVID-19 Vaccine Information can be found at: Marisue Ivan For questions related to vaccine distribution or appointments, please email [email or web address] or call [phone].

## 2020-11-20 DIAGNOSIS — R100 Acute abdomen: Secondary | ICD-10-CM | POA: Diagnosis not present

## 2020-11-20 DIAGNOSIS — R300 Dysuria: Secondary | ICD-10-CM | POA: Diagnosis not present

## 2020-11-20 DIAGNOSIS — N35011 Post-traumatic bulbous urethral stricture: Secondary | ICD-10-CM | POA: Diagnosis not present

## 2020-11-22 DIAGNOSIS — R300 Dysuria: Secondary | ICD-10-CM | POA: Diagnosis not present

## 2020-11-22 DIAGNOSIS — R102 Pelvic and perineal pain: Secondary | ICD-10-CM | POA: Diagnosis not present

## 2020-11-22 DIAGNOSIS — Z8551 Personal history of malignant neoplasm of bladder: Secondary | ICD-10-CM | POA: Diagnosis not present

## 2020-12-05 DIAGNOSIS — E038 Other specified hypothyroidism: Secondary | ICD-10-CM | POA: Diagnosis not present

## 2020-12-05 DIAGNOSIS — E1042 Type 1 diabetes mellitus with diabetic polyneuropathy: Secondary | ICD-10-CM | POA: Diagnosis not present

## 2020-12-06 DIAGNOSIS — N3281 Overactive bladder: Secondary | ICD-10-CM | POA: Diagnosis not present

## 2020-12-06 DIAGNOSIS — N390 Urinary tract infection, site not specified: Secondary | ICD-10-CM | POA: Diagnosis not present

## 2020-12-06 DIAGNOSIS — N35011 Post-traumatic bulbous urethral stricture: Secondary | ICD-10-CM | POA: Diagnosis not present

## 2020-12-06 DIAGNOSIS — R300 Dysuria: Secondary | ICD-10-CM | POA: Diagnosis not present

## 2020-12-07 DIAGNOSIS — N35011 Post-traumatic bulbous urethral stricture: Secondary | ICD-10-CM | POA: Diagnosis not present

## 2020-12-07 DIAGNOSIS — R300 Dysuria: Secondary | ICD-10-CM | POA: Diagnosis not present

## 2020-12-07 DIAGNOSIS — Z8551 Personal history of malignant neoplasm of bladder: Secondary | ICD-10-CM | POA: Diagnosis not present

## 2020-12-07 DIAGNOSIS — N390 Urinary tract infection, site not specified: Secondary | ICD-10-CM | POA: Diagnosis not present

## 2020-12-11 DIAGNOSIS — E103312 Type 1 diabetes mellitus with moderate nonproliferative diabetic retinopathy with macular edema, left eye: Secondary | ICD-10-CM | POA: Diagnosis not present

## 2020-12-11 DIAGNOSIS — H35351 Cystoid macular degeneration, right eye: Secondary | ICD-10-CM | POA: Diagnosis not present

## 2020-12-12 DIAGNOSIS — R809 Proteinuria, unspecified: Secondary | ICD-10-CM | POA: Diagnosis not present

## 2020-12-12 DIAGNOSIS — E038 Other specified hypothyroidism: Secondary | ICD-10-CM | POA: Diagnosis not present

## 2020-12-12 DIAGNOSIS — E103212 Type 1 diabetes mellitus with mild nonproliferative diabetic retinopathy with macular edema, left eye: Secondary | ICD-10-CM | POA: Diagnosis not present

## 2020-12-12 DIAGNOSIS — Z9641 Presence of insulin pump (external) (internal): Secondary | ICD-10-CM | POA: Diagnosis not present

## 2020-12-12 DIAGNOSIS — E1059 Type 1 diabetes mellitus with other circulatory complications: Secondary | ICD-10-CM | POA: Diagnosis not present

## 2020-12-12 DIAGNOSIS — E1042 Type 1 diabetes mellitus with diabetic polyneuropathy: Secondary | ICD-10-CM | POA: Diagnosis not present

## 2020-12-12 DIAGNOSIS — E1029 Type 1 diabetes mellitus with other diabetic kidney complication: Secondary | ICD-10-CM | POA: Diagnosis not present

## 2020-12-20 ENCOUNTER — Other Ambulatory Visit: Payer: Self-pay | Admitting: Cardiovascular Disease

## 2021-01-05 DIAGNOSIS — E1065 Type 1 diabetes mellitus with hyperglycemia: Secondary | ICD-10-CM | POA: Diagnosis not present

## 2021-01-05 DIAGNOSIS — E109 Type 1 diabetes mellitus without complications: Secondary | ICD-10-CM | POA: Diagnosis not present

## 2021-01-14 ENCOUNTER — Other Ambulatory Visit: Payer: Self-pay | Admitting: Cardiovascular Disease

## 2021-01-18 ENCOUNTER — Other Ambulatory Visit: Payer: Self-pay | Admitting: Cardiovascular Disease

## 2021-01-22 DIAGNOSIS — H35351 Cystoid macular degeneration, right eye: Secondary | ICD-10-CM | POA: Diagnosis not present

## 2021-02-06 DIAGNOSIS — N3289 Other specified disorders of bladder: Secondary | ICD-10-CM | POA: Diagnosis not present

## 2021-02-06 DIAGNOSIS — N390 Urinary tract infection, site not specified: Secondary | ICD-10-CM | POA: Diagnosis not present

## 2021-02-06 DIAGNOSIS — N35011 Post-traumatic bulbous urethral stricture: Secondary | ICD-10-CM | POA: Diagnosis not present

## 2021-02-06 DIAGNOSIS — N3281 Overactive bladder: Secondary | ICD-10-CM | POA: Diagnosis not present

## 2021-02-06 DIAGNOSIS — R300 Dysuria: Secondary | ICD-10-CM | POA: Diagnosis not present

## 2021-02-14 DIAGNOSIS — Z8551 Personal history of malignant neoplasm of bladder: Secondary | ICD-10-CM | POA: Diagnosis not present

## 2021-02-14 DIAGNOSIS — N35011 Post-traumatic bulbous urethral stricture: Secondary | ICD-10-CM | POA: Diagnosis not present

## 2021-02-14 DIAGNOSIS — N3281 Overactive bladder: Secondary | ICD-10-CM | POA: Diagnosis not present

## 2021-02-20 DIAGNOSIS — N35011 Post-traumatic bulbous urethral stricture: Secondary | ICD-10-CM | POA: Diagnosis not present

## 2021-02-20 DIAGNOSIS — R300 Dysuria: Secondary | ICD-10-CM | POA: Diagnosis not present

## 2021-02-20 DIAGNOSIS — R351 Nocturia: Secondary | ICD-10-CM | POA: Diagnosis not present

## 2021-02-20 DIAGNOSIS — N390 Urinary tract infection, site not specified: Secondary | ICD-10-CM | POA: Diagnosis not present

## 2021-03-05 DIAGNOSIS — E103312 Type 1 diabetes mellitus with moderate nonproliferative diabetic retinopathy with macular edema, left eye: Secondary | ICD-10-CM | POA: Diagnosis not present

## 2021-03-14 DIAGNOSIS — W57XXXA Bitten or stung by nonvenomous insect and other nonvenomous arthropods, initial encounter: Secondary | ICD-10-CM | POA: Diagnosis not present

## 2021-03-14 DIAGNOSIS — L03115 Cellulitis of right lower limb: Secondary | ICD-10-CM | POA: Diagnosis not present

## 2021-03-16 DIAGNOSIS — E1059 Type 1 diabetes mellitus with other circulatory complications: Secondary | ICD-10-CM | POA: Diagnosis not present

## 2021-03-16 DIAGNOSIS — R809 Proteinuria, unspecified: Secondary | ICD-10-CM | POA: Diagnosis not present

## 2021-03-16 DIAGNOSIS — E103212 Type 1 diabetes mellitus with mild nonproliferative diabetic retinopathy with macular edema, left eye: Secondary | ICD-10-CM | POA: Diagnosis not present

## 2021-03-16 DIAGNOSIS — E1029 Type 1 diabetes mellitus with other diabetic kidney complication: Secondary | ICD-10-CM | POA: Diagnosis not present

## 2021-03-16 DIAGNOSIS — Z9641 Presence of insulin pump (external) (internal): Secondary | ICD-10-CM | POA: Diagnosis not present

## 2021-03-16 DIAGNOSIS — E1042 Type 1 diabetes mellitus with diabetic polyneuropathy: Secondary | ICD-10-CM | POA: Diagnosis not present

## 2021-03-19 DIAGNOSIS — R3129 Other microscopic hematuria: Secondary | ICD-10-CM | POA: Diagnosis not present

## 2021-03-19 DIAGNOSIS — N35011 Post-traumatic bulbous urethral stricture: Secondary | ICD-10-CM | POA: Diagnosis not present

## 2021-03-19 DIAGNOSIS — N3281 Overactive bladder: Secondary | ICD-10-CM | POA: Diagnosis not present

## 2021-03-19 DIAGNOSIS — Z8551 Personal history of malignant neoplasm of bladder: Secondary | ICD-10-CM | POA: Diagnosis not present

## 2021-03-28 DIAGNOSIS — E1065 Type 1 diabetes mellitus with hyperglycemia: Secondary | ICD-10-CM | POA: Diagnosis not present

## 2021-03-28 DIAGNOSIS — E109 Type 1 diabetes mellitus without complications: Secondary | ICD-10-CM | POA: Diagnosis not present

## 2021-04-17 DIAGNOSIS — N323 Diverticulum of bladder: Secondary | ICD-10-CM | POA: Diagnosis not present

## 2021-04-17 DIAGNOSIS — Z8551 Personal history of malignant neoplasm of bladder: Secondary | ICD-10-CM | POA: Diagnosis not present

## 2021-04-17 DIAGNOSIS — R300 Dysuria: Secondary | ICD-10-CM | POA: Diagnosis not present

## 2021-04-17 DIAGNOSIS — N35011 Post-traumatic bulbous urethral stricture: Secondary | ICD-10-CM | POA: Diagnosis not present

## 2021-04-17 DIAGNOSIS — N3281 Overactive bladder: Secondary | ICD-10-CM | POA: Diagnosis not present

## 2021-04-24 DIAGNOSIS — N35011 Post-traumatic bulbous urethral stricture: Secondary | ICD-10-CM | POA: Diagnosis not present

## 2021-04-24 DIAGNOSIS — N3281 Overactive bladder: Secondary | ICD-10-CM | POA: Diagnosis not present

## 2021-04-24 DIAGNOSIS — Z8551 Personal history of malignant neoplasm of bladder: Secondary | ICD-10-CM | POA: Diagnosis not present

## 2021-04-24 DIAGNOSIS — N323 Diverticulum of bladder: Secondary | ICD-10-CM | POA: Diagnosis not present

## 2021-04-27 ENCOUNTER — Other Ambulatory Visit: Payer: Self-pay | Admitting: Cardiovascular Disease

## 2021-04-30 DIAGNOSIS — N35011 Post-traumatic bulbous urethral stricture: Secondary | ICD-10-CM | POA: Diagnosis not present

## 2021-04-30 DIAGNOSIS — N3281 Overactive bladder: Secondary | ICD-10-CM | POA: Diagnosis not present

## 2021-04-30 DIAGNOSIS — R3129 Other microscopic hematuria: Secondary | ICD-10-CM | POA: Diagnosis not present

## 2021-04-30 DIAGNOSIS — R300 Dysuria: Secondary | ICD-10-CM | POA: Diagnosis not present

## 2021-04-30 DIAGNOSIS — N323 Diverticulum of bladder: Secondary | ICD-10-CM | POA: Diagnosis not present

## 2021-05-07 DIAGNOSIS — E103312 Type 1 diabetes mellitus with moderate nonproliferative diabetic retinopathy with macular edema, left eye: Secondary | ICD-10-CM | POA: Diagnosis not present

## 2021-05-07 DIAGNOSIS — E083211 Diabetes mellitus due to underlying condition with mild nonproliferative diabetic retinopathy with macular edema, right eye: Secondary | ICD-10-CM | POA: Diagnosis not present

## 2021-05-21 DIAGNOSIS — N35011 Post-traumatic bulbous urethral stricture: Secondary | ICD-10-CM | POA: Diagnosis not present

## 2021-05-21 DIAGNOSIS — N323 Diverticulum of bladder: Secondary | ICD-10-CM | POA: Diagnosis not present

## 2021-05-21 DIAGNOSIS — Z8551 Personal history of malignant neoplasm of bladder: Secondary | ICD-10-CM | POA: Diagnosis not present

## 2021-06-12 DIAGNOSIS — R809 Proteinuria, unspecified: Secondary | ICD-10-CM | POA: Diagnosis not present

## 2021-06-12 DIAGNOSIS — E11311 Type 2 diabetes mellitus with unspecified diabetic retinopathy with macular edema: Secondary | ICD-10-CM | POA: Diagnosis not present

## 2021-06-12 DIAGNOSIS — L03311 Cellulitis of abdominal wall: Secondary | ICD-10-CM | POA: Diagnosis not present

## 2021-06-12 DIAGNOSIS — E1042 Type 1 diabetes mellitus with diabetic polyneuropathy: Secondary | ICD-10-CM | POA: Diagnosis not present

## 2021-06-12 DIAGNOSIS — Z9641 Presence of insulin pump (external) (internal): Secondary | ICD-10-CM | POA: Diagnosis not present

## 2021-06-12 DIAGNOSIS — E1059 Type 1 diabetes mellitus with other circulatory complications: Secondary | ICD-10-CM | POA: Diagnosis not present

## 2021-06-12 DIAGNOSIS — E1029 Type 1 diabetes mellitus with other diabetic kidney complication: Secondary | ICD-10-CM | POA: Diagnosis not present

## 2021-06-12 DIAGNOSIS — E103212 Type 1 diabetes mellitus with mild nonproliferative diabetic retinopathy with macular edema, left eye: Secondary | ICD-10-CM | POA: Diagnosis not present

## 2021-06-18 DIAGNOSIS — E1042 Type 1 diabetes mellitus with diabetic polyneuropathy: Secondary | ICD-10-CM | POA: Diagnosis not present

## 2021-06-18 DIAGNOSIS — N3281 Overactive bladder: Secondary | ICD-10-CM | POA: Diagnosis not present

## 2021-06-18 DIAGNOSIS — N35011 Post-traumatic bulbous urethral stricture: Secondary | ICD-10-CM | POA: Diagnosis not present

## 2021-06-18 DIAGNOSIS — N3021 Other chronic cystitis with hematuria: Secondary | ICD-10-CM | POA: Diagnosis not present

## 2021-06-18 DIAGNOSIS — Z8551 Personal history of malignant neoplasm of bladder: Secondary | ICD-10-CM | POA: Diagnosis not present

## 2021-06-18 DIAGNOSIS — R809 Proteinuria, unspecified: Secondary | ICD-10-CM | POA: Diagnosis not present

## 2021-06-18 DIAGNOSIS — E1029 Type 1 diabetes mellitus with other diabetic kidney complication: Secondary | ICD-10-CM | POA: Diagnosis not present

## 2021-06-19 DIAGNOSIS — I2510 Atherosclerotic heart disease of native coronary artery without angina pectoris: Secondary | ICD-10-CM | POA: Diagnosis not present

## 2021-06-19 DIAGNOSIS — E109 Type 1 diabetes mellitus without complications: Secondary | ICD-10-CM | POA: Diagnosis not present

## 2021-06-19 DIAGNOSIS — D649 Anemia, unspecified: Secondary | ICD-10-CM | POA: Diagnosis not present

## 2021-06-19 DIAGNOSIS — Z0000 Encounter for general adult medical examination without abnormal findings: Secondary | ICD-10-CM | POA: Diagnosis not present

## 2021-06-19 DIAGNOSIS — E782 Mixed hyperlipidemia: Secondary | ICD-10-CM | POA: Diagnosis not present

## 2021-06-19 DIAGNOSIS — I10 Essential (primary) hypertension: Secondary | ICD-10-CM | POA: Diagnosis not present

## 2021-06-25 DIAGNOSIS — E1065 Type 1 diabetes mellitus with hyperglycemia: Secondary | ICD-10-CM | POA: Diagnosis not present

## 2021-06-25 DIAGNOSIS — E109 Type 1 diabetes mellitus without complications: Secondary | ICD-10-CM | POA: Diagnosis not present

## 2021-07-09 DIAGNOSIS — N35011 Post-traumatic bulbous urethral stricture: Secondary | ICD-10-CM | POA: Diagnosis not present

## 2021-07-09 DIAGNOSIS — N323 Diverticulum of bladder: Secondary | ICD-10-CM | POA: Diagnosis not present

## 2021-07-09 DIAGNOSIS — R300 Dysuria: Secondary | ICD-10-CM | POA: Diagnosis not present

## 2021-07-09 DIAGNOSIS — Z8551 Personal history of malignant neoplasm of bladder: Secondary | ICD-10-CM | POA: Diagnosis not present

## 2021-07-09 DIAGNOSIS — N390 Urinary tract infection, site not specified: Secondary | ICD-10-CM | POA: Diagnosis not present

## 2021-07-17 DIAGNOSIS — N35011 Post-traumatic bulbous urethral stricture: Secondary | ICD-10-CM | POA: Diagnosis not present

## 2021-07-17 DIAGNOSIS — N323 Diverticulum of bladder: Secondary | ICD-10-CM | POA: Diagnosis not present

## 2021-07-17 DIAGNOSIS — Z8551 Personal history of malignant neoplasm of bladder: Secondary | ICD-10-CM | POA: Diagnosis not present

## 2021-07-24 DIAGNOSIS — E103312 Type 1 diabetes mellitus with moderate nonproliferative diabetic retinopathy with macular edema, left eye: Secondary | ICD-10-CM | POA: Diagnosis not present

## 2021-08-07 DIAGNOSIS — N322 Vesical fistula, not elsewhere classified: Secondary | ICD-10-CM | POA: Diagnosis not present

## 2021-08-07 DIAGNOSIS — D485 Neoplasm of uncertain behavior of skin: Secondary | ICD-10-CM | POA: Diagnosis not present

## 2021-08-07 DIAGNOSIS — N323 Diverticulum of bladder: Secondary | ICD-10-CM | POA: Diagnosis not present

## 2021-08-07 DIAGNOSIS — C44319 Basal cell carcinoma of skin of other parts of face: Secondary | ICD-10-CM | POA: Diagnosis not present

## 2021-08-07 DIAGNOSIS — Z8551 Personal history of malignant neoplasm of bladder: Secondary | ICD-10-CM | POA: Diagnosis not present

## 2021-08-07 DIAGNOSIS — L570 Actinic keratosis: Secondary | ICD-10-CM | POA: Diagnosis not present

## 2021-08-07 DIAGNOSIS — N401 Enlarged prostate with lower urinary tract symptoms: Secondary | ICD-10-CM | POA: Diagnosis not present

## 2021-08-07 DIAGNOSIS — N35011 Post-traumatic bulbous urethral stricture: Secondary | ICD-10-CM | POA: Diagnosis not present

## 2021-08-21 DIAGNOSIS — C44319 Basal cell carcinoma of skin of other parts of face: Secondary | ICD-10-CM | POA: Diagnosis not present

## 2021-09-04 DIAGNOSIS — N35011 Post-traumatic bulbous urethral stricture: Secondary | ICD-10-CM | POA: Diagnosis not present

## 2021-09-04 DIAGNOSIS — N3020 Other chronic cystitis without hematuria: Secondary | ICD-10-CM | POA: Diagnosis not present

## 2021-09-04 DIAGNOSIS — R82998 Other abnormal findings in urine: Secondary | ICD-10-CM | POA: Diagnosis not present

## 2021-09-04 DIAGNOSIS — Z8551 Personal history of malignant neoplasm of bladder: Secondary | ICD-10-CM | POA: Diagnosis not present

## 2021-09-10 DIAGNOSIS — E103312 Type 1 diabetes mellitus with moderate nonproliferative diabetic retinopathy with macular edema, left eye: Secondary | ICD-10-CM | POA: Diagnosis not present

## 2021-09-11 DIAGNOSIS — U071 COVID-19: Secondary | ICD-10-CM

## 2021-09-11 HISTORY — DX: COVID-19: U07.1

## 2021-09-13 DIAGNOSIS — E1065 Type 1 diabetes mellitus with hyperglycemia: Secondary | ICD-10-CM | POA: Diagnosis not present

## 2021-09-13 DIAGNOSIS — E109 Type 1 diabetes mellitus without complications: Secondary | ICD-10-CM | POA: Diagnosis not present

## 2021-09-23 DIAGNOSIS — U071 COVID-19: Secondary | ICD-10-CM | POA: Diagnosis not present

## 2021-09-23 DIAGNOSIS — J069 Acute upper respiratory infection, unspecified: Secondary | ICD-10-CM | POA: Diagnosis not present

## 2021-10-09 ENCOUNTER — Other Ambulatory Visit: Payer: Self-pay | Admitting: Cardiovascular Disease

## 2021-10-12 DIAGNOSIS — N323 Diverticulum of bladder: Secondary | ICD-10-CM | POA: Diagnosis not present

## 2021-10-12 DIAGNOSIS — N35011 Post-traumatic bulbous urethral stricture: Secondary | ICD-10-CM | POA: Diagnosis not present

## 2021-10-12 DIAGNOSIS — R3129 Other microscopic hematuria: Secondary | ICD-10-CM | POA: Diagnosis not present

## 2021-10-12 DIAGNOSIS — N401 Enlarged prostate with lower urinary tract symptoms: Secondary | ICD-10-CM | POA: Diagnosis not present

## 2021-10-12 DIAGNOSIS — N3021 Other chronic cystitis with hematuria: Secondary | ICD-10-CM | POA: Diagnosis not present

## 2021-10-12 DIAGNOSIS — R82998 Other abnormal findings in urine: Secondary | ICD-10-CM | POA: Diagnosis not present

## 2021-10-12 DIAGNOSIS — Z8551 Personal history of malignant neoplasm of bladder: Secondary | ICD-10-CM | POA: Diagnosis not present

## 2021-10-15 DIAGNOSIS — E113212 Type 2 diabetes mellitus with mild nonproliferative diabetic retinopathy with macular edema, left eye: Secondary | ICD-10-CM | POA: Diagnosis not present

## 2021-10-15 DIAGNOSIS — E083211 Diabetes mellitus due to underlying condition with mild nonproliferative diabetic retinopathy with macular edema, right eye: Secondary | ICD-10-CM | POA: Diagnosis not present

## 2021-10-22 DIAGNOSIS — N390 Urinary tract infection, site not specified: Secondary | ICD-10-CM | POA: Diagnosis not present

## 2021-10-22 DIAGNOSIS — Z8551 Personal history of malignant neoplasm of bladder: Secondary | ICD-10-CM | POA: Diagnosis not present

## 2021-10-22 DIAGNOSIS — N35011 Post-traumatic bulbous urethral stricture: Secondary | ICD-10-CM | POA: Diagnosis not present

## 2021-10-24 DIAGNOSIS — R300 Dysuria: Secondary | ICD-10-CM | POA: Diagnosis not present

## 2021-10-24 DIAGNOSIS — N323 Diverticulum of bladder: Secondary | ICD-10-CM | POA: Diagnosis not present

## 2021-10-24 DIAGNOSIS — N369 Urethral disorder, unspecified: Secondary | ICD-10-CM | POA: Diagnosis not present

## 2021-10-24 DIAGNOSIS — N390 Urinary tract infection, site not specified: Secondary | ICD-10-CM | POA: Diagnosis not present

## 2021-11-09 DIAGNOSIS — R3915 Urgency of urination: Secondary | ICD-10-CM | POA: Diagnosis not present

## 2021-11-09 DIAGNOSIS — N35011 Post-traumatic bulbous urethral stricture: Secondary | ICD-10-CM | POA: Diagnosis not present

## 2021-11-09 DIAGNOSIS — R300 Dysuria: Secondary | ICD-10-CM | POA: Diagnosis not present

## 2021-11-16 DIAGNOSIS — Z8551 Personal history of malignant neoplasm of bladder: Secondary | ICD-10-CM | POA: Diagnosis not present

## 2021-11-16 DIAGNOSIS — R300 Dysuria: Secondary | ICD-10-CM | POA: Diagnosis not present

## 2021-11-16 DIAGNOSIS — N3281 Overactive bladder: Secondary | ICD-10-CM | POA: Diagnosis not present

## 2021-11-16 DIAGNOSIS — N35011 Post-traumatic bulbous urethral stricture: Secondary | ICD-10-CM | POA: Diagnosis not present

## 2021-11-22 ENCOUNTER — Other Ambulatory Visit: Payer: Self-pay

## 2021-11-22 ENCOUNTER — Other Ambulatory Visit
Admission: RE | Admit: 2021-11-22 | Discharge: 2021-11-22 | Disposition: A | Discharge status: Home / Self Care | Payer: Medicare HMO | Source: Ambulatory Visit | Attending: Urology | Admitting: Urology

## 2021-11-22 ENCOUNTER — Telehealth: Payer: Self-pay | Admitting: Cardiovascular Disease

## 2021-11-22 DIAGNOSIS — I10 Essential (primary) hypertension: Secondary | ICD-10-CM

## 2021-11-22 HISTORY — DX: Personal history of urinary calculi: Z87.442

## 2021-11-22 NOTE — Telephone Encounter (Signed)
° °  Pre-operative Risk Assessment    Patient Name: [name]  DOB: 09-May-1946 MRN: [id number]      Request for Surgical Clearance    Procedure:   cystoscopy, internal urethrotomy & optilume   Date of Surgery:  Clearance 11/29/21                                 Surgeon:  Dr Sherrye Payor Group or Practice Name:  [facility] Phone number:  [phone] Fax number:  [phone]   Type of Clearance Requested:   - Medical    Type of Anesthesia:  Not Indicated   Additional requests/questions:    SignedAce Gins   11/22/2021, 3:51 PM

## 2021-11-22 NOTE — Pre-Procedure Instructions (Addendum)
Called over to Dr Fabio Asa office to let them know pt is needing cardiac clearance and that I have faxed over to Dr Gwenyth Ober office the clearance request. Receptionist took down info and will let Dr Yves Dill know

## 2021-11-22 NOTE — Patient Instructions (Addendum)
Your procedure is scheduled on: 11/29/21 - Thursday Report to the Registration Desk on the 1st floor of the [facility]. To find out your arrival time, please call [phone] between 1PM - 3PM on: 11/28/21 - Wednesday Report to the [facility] for Labs/EKG on 11/27/21 at 12:30pm.  REMEMBER: Instructions that are not followed completely may result in serious medical risk, up to and including death; or upon the discretion of your surgeon and anesthesiologist your surgery may need to be rescheduled.  Do not eat food after midnight the night before surgery.  No gum chewing, lozengers or hard candies.  You may however, drink CLEAR liquids up to 2 hours before you are scheduled to arrive for your surgery. Do not drink anything within 2 hours of your scheduled arrival time.  Clear liquids include: - water - Type 1 and Type 2 diabetics should only drink water.  TAKE THESE MEDICATIONS THE MORNING OF SURGERY WITH A SIP OF WATER: - carvedilol (COREG) 6.25 MG tablet  Follow recommendations from Cardiologist, Pulmonologist or PCP regarding stopping Aspirin, Coumadin, Plavix, Eliquis, Pradaxa, or Pletal.  One week prior to surgery: Stop Anti-inflammatories (NSAIDS) such as Advil, Aleve, Ibuprofen, Motrin, Naproxen, Naprosyn and Aspirin based products such as Excedrin, Goodys Powder, BC Powder.  Stop ANY OVER THE COUNTER supplements until after surgery.  You may take Tylenol if needed for pain up until the day of surgery.  No Alcohol for 24 hours before or after surgery.  No Smoking including e-cigarettes for 24 hours prior to surgery.  No chewable tobacco products for at least 6 hours prior to surgery.  No nicotine patches on the day of surgery.  Do not use any "recreational" drugs for at least a week prior to your surgery.  Please be advised that the combination of cocaine and anesthesia may have negative outcomes, up to and including death. If you test positive for cocaine, your  surgery will be cancelled.  On the morning of surgery brush your teeth with toothpaste and water, you may rinse your mouth with mouthwash if you wish. Do not swallow any toothpaste or mouthwash.  Do not wear jewelry, make-up, hairpins, clips or nail polish.  Do not wear lotions, powders, or perfumes.   Do not shave body from the neck down 48 hours prior to surgery just in case you cut yourself which could leave a site for infection.  Also, freshly shaved skin may become irritated if using the CHG soap.  Contact lenses, hearing aids and dentures may not be worn into surgery.  Do not bring valuables to the hospital. [facility] is not responsible for any missing/lost belongings or valuables.   Notify your doctor if there is any change in your medical condition (cold, fever, infection).  Wear comfortable clothing (specific to your surgery type) to the hospital.  After surgery, you can help prevent lung complications by doing breathing exercises.  Take deep breaths and cough every 1-2 hours. Your doctor may order a device called an Incentive Spirometer to help you take deep breaths. When coughing or sneezing, hold a pillow firmly against your incision with both hands. This is called splinting. Doing this helps protect your incision. It also decreases belly discomfort.  If you are being admitted to the hospital overnight, leave your suitcase in the car. After surgery it may be brought to your room.  If you are being discharged the day of surgery, you will not be allowed to drive home. You will need a responsible  adult (18 years or older) to drive you home and stay with you that night.   If you are taking public transportation, you will need to have a responsible adult (18 years or older) with you. Please confirm with your physician that it is acceptable to use public transportation.   Please call the [facility] Dept. at [phone] if you have any questions about these  instructions.  Surgery Visitation Policy:  Patients undergoing a surgery or procedure may have one family member or support person with them as long as that person is not COVID-19 positive or experiencing its symptoms.  That person may remain in the waiting area during the procedure and may rotate out with other people.  Inpatient Visitation:    Visiting hours are 7 a.m. to 8 p.m. Up to two visitors ages 16+ are allowed at one time in a patient room. The visitors may rotate out with other people during the day. Visitors must check out when they leave, or other visitors will not be allowed. One designated support person may remain overnight. The visitor must pass COVID-19 screenings, use hand sanitizer when entering and exiting the patients room and wear a mask at all times, including in the patients room. Patients must also wear a mask when staff or their visitor are in the room. Masking is required regardless of vaccination status.

## 2021-11-22 NOTE — Pre-Procedure Instructions (Signed)
[name], MD  Physician Cardiology Progress Notes    Signed Encounter Date:  11/15/2020   Signed      Expand All Collapse All                                                                                                                                                                                                                                                                                                                                                                               Patient ID: [name], male   DOB: 03/23/1946, 76 y.o.   MRN: [id number] Cardiology Office Note   Date:  11/15/2020    ID:  [name], DOB September 01, 1946, MRN [id number]   PCP:  [name], MD            Chief Complaint  Patient presents with   Other      12 month follow up. Meds reviewed verbally with patient.       HPI:  [name] is a very pleasant 76 year old gentleman with a history of coronary artery disease,  occluded LAD with stent placed February 2008 ( Cypher 3.0 x 8 mm DES stent),   previous angina was described as posterior neck pain.    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   On previous visits, he reported having night sweats.  We held his carvedilol and Lipitor. Night sweats did not improve. told he had infection in his gums and has had significant work done.  night sweats have improved       Cardiac catheter report from 2008 details 25% ostial left main, 100% mid LAD with stent placed at this location,40% proximal circumflex, 20% PL vessel disease Previous stress test showing scar in the mid to distal anterior wall. Previous lab work; Hemoglobin A1c 8.2, total cholesterol 134, LDL 65   PMH:   has a past medical history of Anemia, Arthritis, Cancer ([place]), Cataract (04/2020), Coronary artery disease, Diabetes mellitus, Edema, GERD (gastroesophageal reflux disease), Heart attack ([place]) (2007), Hyperlipidemia, Hypertension, Hypoglycemia, Ischemic cardiomyopathy, Myocardial infarction [facility]), and Peripheral vascular disease ([place]).   PSH:         Past Surgical History:  Procedure Laterality Date   CARDIAC CATHETERIZATION       CATARACT EXTRACTION W/PHACO Left 04/18/2020    Procedure: CATARACT EXTRACTION PHACO AND INTRAOCULAR LENS PLACEMENT (IOC) LEFT DIABETIC 14.60  01:57.1;  Surgeon: Birder Robson, MD;  Location: [place];  Service: Ophthalmology;  Laterality: Left;  Diabetic - insulin pump   CATARACT EXTRACTION W/PHACO Right 10/10/2020    Procedure: CATARACT EXTRACTION PHACO AND INTRAOCULAR LENS PLACEMENT (IOC) RIGHT DIABETIC 12.59 01:44.7;  Surgeon: Birder Robson, MD;  Location: [facility];  Service: Ophthalmology;  Laterality: Right;  Diabetic - insulin pump   COLONOSCOPY WITH PROPOFOL N/A 02/18/2018    Procedure: COLONOSCOPY WITH PROPOFOL;  Surgeon: Toledo, Benay Pike, MD;  Location: [facility];  Service: Gastroenterology;   Laterality: N/A;   CORONARY ANGIOPLASTY       CORONARY STENT PLACEMENT        To an occluded LAD x2   CYSTOSCOPY WITH BIOPSY N/A 10/21/2017    Procedure: CYSTOSCOPY WITH BLADDER BIOPSY;  Surgeon: Royston Cowper, MD;  Location: [facility];  Service: Urology;  Laterality: N/A;   CYSTOSCOPY WITH BIOPSY N/A 12/08/2018    Procedure: CYSTOSCOPY WITH BLADDER BIOPSY-MITOMYCIN;  Surgeon: Royston Cowper, MD;  Location: [facility];  Service: Urology;  Laterality: N/A;   CYSTOSCOPY WITH DIRECT VISION INTERNAL URETHROTOMY   03/02/2020    Procedure: CYSTOSCOPY WITH DIRECT VISION INTERNAL URETHROTOMY;  Surgeon: Royston Cowper, MD;  Location: [facility];  Service: Urology;;   CYSTOSCOPY WITH DIRECT VISION INTERNAL URETHROTOMY N/A 09/14/2020    Procedure: CYSTOSCOPY WITH DIRECT VISION INTERNAL URETHROTOMY WITH HOLM LASER;  Surgeon: Royston Cowper, MD;  Location: [facility];  Service: Urology;  Laterality: N/A;   CYSTOSCOPY WITH HOLMIUM LASER LITHOTRIPSY N/A 05/08/2020    Procedure: CYSTOSCOPY WITH HOLMIUM LASER LITHOTRIPSY;  Surgeon: Royston Cowper, MD;  Location: [facility];  Service: Urology;  Laterality: N/A;   EXCISION MASS LOWER EXTREMETIES Right 06/05/2016    Procedure: EXCISION OF LESION RIGHT LOWER LEG;  Surgeon: Hubbard Robinson, MD;  Location: [facility];  Service: General;  Laterality: Right;   GREEN LIGHT  LASER TURP (TRANSURETHRAL RESECTION OF PROSTATE N/A 08/12/2019    Procedure: GREEN LIGHT LASER TURP (TRANSURETHRAL RESECTION OF PROSTATE, BLADDER BIOPSY;  Surgeon: Royston Cowper, MD;  Location: [facility];  Service: Urology;  Laterality: N/A;   HOLMIUM LASER APPLICATION   08/04/2682    Procedure: HOLMIUM LASER APPLICATION;  Surgeon: Royston Cowper, MD;  Location: [facility];  Service: Urology;;  Urethral stone    SKIN CANCER EXCISION        chest   TRANSURETHRAL RESECTION OF BLADDER TUMOR N/A 07/15/2017    Procedure: TRANSURETHRAL RESECTION OF BLADDER TUMOR (TURBT);  Surgeon: Royston Cowper, MD;   Location: [facility];  Service: Urology;  Laterality: N/A;   URETERAL BIOPSY N/A 08/12/2019    Procedure: URETERAL BIOPSY;  Surgeon: Royston Cowper, MD;  Location: [facility];  Service: Urology;  Laterality: N/A;   URETHROTOMY N/A 05/08/2020    Procedure: CYSTOSCOPY/URETHROTOMY;  Surgeon: Royston Cowper, MD;  Location: [facility];  Service: Urology;  Laterality: N/A;            Current Outpatient Medications  Medication Sig Dispense Refill   aspirin EC 81 MG tablet Take 81 mg by mouth daily.       atorvastatin (LIPITOR) 80 MG tablet Take 1 tablet (80 mg total) by mouth daily. Please schedule office visit for further refills. Thank you! 90 tablet 0   docusate sodium (COLACE) 100 MG capsule Take 2 capsules (200 mg total) by mouth 2 (two) times daily. 120 capsule 3   glucagon 1 MG injection Inject 1 mg into the skin once as needed (for low blood sugar).        Insulin Human (INSULIN PUMP) SOLN Inject into the skin.       Meth-Hyo-M Bl-Na Phos-Ph Sal (URIBEL) 118 MG CAPS Take 1 capsule (118 mg total) by mouth every 6 (six) hours as needed (dysuria). 40 capsule 3   Multiple Vitamin (MULTIVITAMIN WITH MINERALS) TABS tablet Take 1 tablet by mouth daily.       naproxen sodium (ALEVE) 220 MG tablet Take 220-440 mg by mouth 2 (two) times daily as needed (pain/headaches.).       NOVOLOG 100 UNIT/ML injection See admin instructions. Uses via inuslin pump   0   vitamin B-12 (CYANOCOBALAMIN) 1000 MCG tablet Take 1,000 mcg by mouth 2 (two) times a week.        carvedilol (COREG) 6.25 MG tablet Take 1 tablet (6.25 mg total) by mouth 2 (two) times daily with a meal. Please keep your upcoming appointment for refills. 180 tablet 3   losartan (COZAAR) 50 MG tablet Take 0.5 tablets (25 mg total) by mouth every other day. 23 tablet 3    No current facility-administered medications for this visit.        Allergies:   Patient has no known allergies.    Social History:  The patient  reports that he quit smoking  about 25 years ago. His smoking use included cigarettes. He has a 10.00 pack-year smoking history. He has never used smokeless tobacco. He reports that he does not drink alcohol and does not use drugs.    Family History:   family history includes Heart attack in his mother; Heart disease in his father and mother; Prostate cancer in his brother.      Review of Systems: Review of Systems  Constitutional: Negative.   Respiratory: Negative.   Cardiovascular: Negative.   Gastrointestinal: Negative.   Musculoskeletal: Negative.   Neurological: Negative.  Psychiatric/Behavioral: Negative.   All other systems reviewed and are negative.     PHYSICAL EXAM: VS:  BP 120/70 (BP Location: Left Arm, Patient Position: Sitting, Cuff Size: Normal)    Pulse 72    Ht _0  (1.753 m)    Wt 185 lb (83.9 kg)    SpO2 98%    BMI 27.32 kg/m  , BMI Body mass index is 27.32 kg/m. Constitutional:  oriented to person, place, and time. No distress.  HENT:  Head: Grossly normal Eyes:  no discharge. No scleral icterus.  Neck: No JVD, no carotid bruits  Cardiovascular: Regular rate and rhythm, no murmurs appreciated 1-2+ pitting lower extremity edema Pulmonary/Chest: Clear to auscultation bilaterally, no wheezes or rails Abdominal: Soft.  no distension.  no tenderness.  Musculoskeletal: Normal range of motion Neurological:  normal muscle tone. Coordination normal. No atrophy Skin: Skin warm and dry Psychiatric: normal affect, pleasant     Recent Labs: 09/13/2020: BUN 19; Creatinine, Ser 0.95; Hemoglobin 10.6; Platelets 243; Potassium 4.4; Sodium 142      Lipid Panel Recent Labs       Lab Results  Component Value Date    CHOL 101 06/08/2018    HDL 57 06/08/2018    LDLCALC 37 06/08/2018    TRIG 34 06/08/2018             Wt Readings from Last 3 Encounters:  11/15/20 185 lb (83.9 kg)  10/10/20 180 lb (81.6 kg)  09/14/20 180 lb (81.6 kg)        ASSESSMENT AND PLAN:   Atherosclerosis of native  coronary artery of native heart without angina pectoris - Plan: EKG 12-Lead Known CAD,  stent x2 seen in the LAD  mild circumflex and RCA calcification -Cholesterol at goal,  current non-smoker A1c trending upwards, recommend he continue to work on his diet, exercise   HYPERTENSION, BENIGN - Plan: EKG 12-Lead Changes as below  coreg 6.25 twice a day losartan 25 mg every other day May be able to titrate up on the losartan to daily if blood pressure stable   Hyperlipidemia Cholesterol is at goal on the current lipid regimen. No changes to the medications were made.   Controlled type 2 diabetes mellitus with other circulatory complication, without long-term current use of insulin ([name])  hemoglobin A1c 7 to 8 He has had diabetes for 50 years Followed by endocrinology He is working hard on exercise, diet   Lymphedema Suggested he see Dr. Dew/Dr. Ronalee Belts Wears compressions Avoid calcium channel blockers    Total encounter time more than 25 minutes  Greater than 50% was spent in counseling and coordination of care with the patient           Orders Placed This Encounter  Procedures   EKG 12-Lead     Total encounter time more than 15 minutes  Greater than 50% was spent in counseling and coordination of care with the patient     Signed, Esmond Plants, M.D., Ph.D. 11/15/2020  [place]              Electronically signed by [name], MD at 11/15/2020 11:01 AM Office Visit on 11/15/2020  Office Visit on 11/15/2020   Detailed Report  Note shared with patient Additional Documentation  Vitals:  BP 120/70 (BP Location: Left Arm, Patient Position: Sitting, Cuff Size: Normal) Pulse 72 Ht _1  (1.753 m) Wt 83.9 kg SpO2 98% BMI 27.32 kg/m BSA 2.02 m  Flowsheets:  NEWS, MEWS Score, Anthropometrics   Encounter Info:  Billing Info, History, Allergies, Detailed Report

## 2021-11-23 NOTE — Telephone Encounter (Signed)
Pt has been scheduled to see Dr. Rockey Situ, Monday, 11/26/21, and surgical clearance will be addressed at that time.  Will route to requesting surgeon's office to make them aware of appointment for Surgical Clearance.

## 2021-11-23 NOTE — Pre-Procedure Instructions (Signed)
Pt has appointment with Dr Rockey Situ on 11-26-21 for cardiac clearance

## 2021-11-23 NOTE — Pre-Procedure Instructions (Signed)
Pre admission interview and instruction review done with patient, he is type 1 diabetic and has a Tandem 2 Insulin pump, DM coordinator - Tillie Rung informed that patient will be having a procedure. I also called and left a message for his Endocrinologist, Dr. Gabriel Carina to make her aware that patient is having a procedure as well.

## 2021-11-23 NOTE — H&P (Signed)
NAME: [name], [name] MEDICAL RECORD NO: [id number] ACCOUNT NO: [id number] DATE OF BIRTH: 1946/02/28 FACILITY: [facility] LOCATION: [facility] PHYSICIAN: Otelia Limes. Yves Dill, MD  History and Physical   DATE OF ADMISSION: 11/29/2021  Same day surgery 01/19.  CHIEF COMPLAINT:  Difficulty voiding.  HISTORY OF PRESENT ILLNESS:  The patient is a 76 year old white male with a history of chronic urethral stricture disease, managed with periodic dilation in the office.  He was most recently dilated on 01/06.  He comes in now for Optilume dilatation of  the stricture.  PAST MEDICAL HISTORY:  ALLERGIES:  No drug allergies.   CURRENT MEDICATIONS:  Plavix, losartan, Lasix, carvedilol, multivitamins, aspirin, tamsulosin, and insulin.  PAST SURGICAL HISTORY:    1.  Coronary artery stent in 2006. 2.  Transurethral resection of bladder cancer in 2018. 3.  Photovaporization of prostate with GreenLight laser in 2020. 4.  Cystoscopy with laser lithotripsy of urethral calculus in 2021.  PAST AND CURRENT MEDICAL CONDITIONS: 1.  Coronary artery disease. 2.  Insulin-dependent diabetes. 3.  Hypertension. 4.  History of bladder cancer. 5.  History of bladder diverticulum. 6.  Urethral stricture disease. 7.  Kidney stones.  REVIEW OF SYSTEMS:  The patient denies chest pain, shortness of breath, or stroke.  He does bruise easily due to aspirin and Plavix exposure.  SOCIAL HISTORY:  The patient quit smoking in 1998 with a 15-pack-year history.  He denies alcohol use.  FAMILY HISTORY:  Father died at age 66 of myocardial infarction.  Mother died of heart disease at age 29.  PHYSICAL EXAMINATION:   VITAL SIGNS:  Weight 200 pounds, height 5 feet 9 inches. GENERAL:  Well-nourished white male in no acute distress. HEENT:  Sclerae were clear.  Pupils were equally round, reactive to light and accommodation.  Extraocular movements were intact. NECK:  No palpable masses or tenderness. PULMONARY:  Lungs  clear to auscultation. LYMPHATIC:  No palpable cervical or inguinal adenopathy. CARDIOVASCULAR:  Regular rhythm and rate. ABDOMEN:  Soft, nontender.  No CVA tenderness. GENITOURINARY: Circumcised.  Testes smooth, nontender, 20 mL size each. RECTAL:  Deferred. NEUROMUSCULAR:  Alert and oriented x3.  IMPRESSION:    1.  Recurrent urethral stricture disease. 2.  History of benign prostatic hypertrophy, status post GreenLight PVP.  PLAN:  Optilume dilatation of urethral stricture.   ROH D: 11/23/2021 12:24:34 pm T: 11/23/2021 12:59:00 pm  JOB: [id number]/ [id number]

## 2021-11-23 NOTE — Telephone Encounter (Signed)
° °  Name: [name]  DOB: 29-Sep-1946  MRN: [id number]  Primary Cardiologist: Ida Rogue, MD  Chart reviewed as part of pre-operative protocol coverage. Because of [name]'s past medical history and time since last visit, he will require a follow-up visit in order to better assess preoperative cardiovascular risk.  Pre-op covering staff: - Please schedule appointment and call patient to inform them. If patient already had an upcoming appointment within acceptable timeframe, please add "pre-op clearance" to the appointment notes so provider is aware. - Please contact requesting surgeon's office via preferred method (i.e, phone, fax) to inform them of need for appointment prior to surgery.  If applicable, this message will also be routed to pharmacy pool and/or primary cardiologist for input on holding anticoagulant/antiplatelet agent as requested below so that this information is available to the clearing provider at time of patient's appointment.   Tyler Run, [place]  11/23/2021, 10:15 AM

## 2021-11-25 NOTE — Progress Notes (Signed)
Patient ID: [name], male   DOB: [birth year], 76 y.o.   MRN: [id number] Cardiology Office Note  Date:  11/26/2021   ID:  [name], DOB November 12, 1945, MRN [id number]  PCP:  [name], MD   Chief Complaint  Patient presents with   Cardiac clearance for surgery     Patient c/o LE edema with more in the left. Patient is scheduled to have a  [place] for this Thursday, January 19th. Medications reviewed by the patient verbally.     HPI:  [name] is a very pleasant 76 year old gentleman with a history of coronary artery disease,  occluded LAD with stent placed February 2008 ( Cypher 3.0 x 8 mm DES stent),   previous angina was described as posterior neck pain.    diabetes type II with complications hypertension  Former smoker, quit late 24s bladder cancer,08/2017 finished chemo  Lymphedema lower extremities who presents for routine followup of his coronary artery disease  Last seen in clinic by myself jan 2022  completed treatments for bladder cancer,  Continues to have urethral stricture Scheduled for new procedure, Thursday 11/29/21  Getting shots in eyes for diabetes  No angina, no SOB on exertion Atypical chest pain 4-5 months ago, none since then [place] with no exertional angina  labs A1c 6.7 Total chol 157, LDL 81  Uses insulin pump Followed by endocrine  EKG personally reviewed by myself on todays visit Shows normal sinus rhythm rate 81 bpm consider old anteroseptal MI  Other past medical history  previous angina was described as posterior neck pain.   On previous visits, he reported having night sweats.  We held his carvedilol and Lipitor. Night sweats did not improve. told he had infection in his gums and has had significant work done.  night sweats have improved       Cardiac catheter report from 2008 details 25% ostial left main, 100% mid LAD with stent placed at  this location,40% proximal circumflex, 20% PL vessel disease Previous stress test showing scar in the mid to distal anterior wall. Previous lab work; Hemoglobin A1c 8.2, total cholesterol 134, LDL 65  PMH:   has a past medical history of Anemia, Arthritis, Cancer ([place]), Cataract (04/2020), Coronary artery disease, COVID-19 (09/2021), Diabetes mellitus, Edema, GERD (gastroesophageal reflux disease), Heart attack ([place]) (2007), History of kidney stones, Hyperlipidemia, Hypertension, Hypoglycemia, Ischemic cardiomyopathy, Myocardial infarction [facility]), and Peripheral vascular disease ([place]).  PSH:    Past Surgical History:  Procedure Laterality Date   CARDIAC CATHETERIZATION     CATARACT EXTRACTION W/PHACO Left 04/18/2020   Procedure: CATARACT EXTRACTION PHACO AND INTRAOCULAR LENS PLACEMENT (IOC) LEFT DIABETIC 14.60  01:57.1;  Surgeon: Birder Robson, MD;  Location: [facility];  Service: Ophthalmology;  Laterality: Left;  Diabetic - insulin pump   CATARACT EXTRACTION W/PHACO Right 10/10/2020   Procedure: CATARACT EXTRACTION PHACO AND INTRAOCULAR LENS PLACEMENT (IOC) RIGHT DIABETIC 12.59 01:44.7;  Surgeon: Birder Robson, MD;  Location: [facility];  Service: Ophthalmology;  Laterality: Right;  Diabetic - insulin pump   COLONOSCOPY WITH PROPOFOL N/A 02/18/2018   Procedure: COLONOSCOPY WITH PROPOFOL;  Surgeon: Tol[place], Benay Pike, MD;  Location: [facility];  Service: Gastroenterology;  Laterality: N/A;   CORONARY ANGIOPLASTY     CORONARY STENT PLACEMENT     To an occluded LAD x2   CYSTOSCOPY WITH BIOPSY N/A 10/21/2017   Procedure: CYSTOSCOPY WITH BLADDER BIOPSY;  Surgeon: Royston Cowper, MD;  Location:  [facility];  Service: Urology;  Laterality: N/A;   CYSTOSCOPY WITH BIOPSY N/A 12/08/2018   Procedure: CYSTOSCOPY WITH BLADDER BIOPSY-MITOMYCIN;  Surgeon: Royston Cowper, MD;  Location: [facility];  Service: Urology;  Laterality: N/A;   CYSTOSCOPY WITH DIRECT VISION INTERNAL URETHROTOMY   03/02/2020   Procedure: CYSTOSCOPY WITH DIRECT VISION INTERNAL URETHROTOMY;  Surgeon: Royston Cowper, MD;  Location: [facility];  Service: Urology;;   CYSTOSCOPY WITH DIRECT VISION INTERNAL URETHROTOMY N/A 09/14/2020   Procedure: CYSTOSCOPY WITH DIRECT VISION INTERNAL URETHROTOMY WITH HOLM LASER;  Surgeon: Royston Cowper, MD;  Location: [facility];  Service: Urology;  Laterality: N/A;   CYSTOSCOPY WITH HOLMIUM LASER LITHOTRIPSY N/A 05/08/2020   Procedure: CYSTOSCOPY WITH HOLMIUM LASER LITHOTRIPSY;  Surgeon: Royston Cowper, MD;  Location: [facility];  Service: Urology;  Laterality: N/A;   EXCISION MASS LOWER EXTREMETIES Right 06/05/2016   Procedure: EXCISION OF LESION RIGHT LOWER LEG;  Surgeon: Hubbard Robinson, MD;  Location: [facility];  Service: General;  Laterality: Right;   GREEN LIGHT LASER TURP (TRANSURETHRAL RESECTION OF PROSTATE N/A 08/12/2019   Procedure: GREEN LIGHT LASER TURP (TRANSURETHRAL RESECTION OF PROSTATE, BLADDER BIOPSY;  Surgeon: Royston Cowper, MD;  Location: [facility];  Service: Urology;  Laterality: N/A;   HOLMIUM LASER APPLICATION  1/93/7902   Procedure: HOLMIUM LASER APPLICATION;  Surgeon: Royston Cowper, MD;  Location: [facility];  Service: Urology;;  Urethral stone    SKIN CANCER EXCISION     chest   TRANSURETHRAL RESECTION OF BLADDER TUMOR N/A 07/15/2017   Procedure: TRANSURETHRAL RESECTION OF BLADDER TUMOR (TURBT);  Surgeon: Royston Cowper, MD;  Location: [facility];  Service: Urology;  Laterality: N/A;   URETERAL BIOPSY N/A 08/12/2019   Procedure: URETERAL BIOPSY;  Surgeon: Royston Cowper, MD;  Location: [facility];  Service: Urology;  Laterality: N/A;   URETHROTOMY N/A 05/08/2020   Procedure: CYSTOSCOPY/URETHROTOMY;  Surgeon: Royston Cowper, MD;  Location: [facility];  Service: Urology;  Laterality: N/A;    Current Outpatient Medications  Medication Sig Dispense Refill   aspirin EC 81 MG tablet Take 81 mg by mouth daily.     atorvastatin (LIPITOR) 80 MG tablet TAKE 1  TABLET BY MOUTH DAILY 90 tablet 2   carvedilol (COREG) 6.25 MG tablet Take 1 tablet (6.25 mg total) by mouth 2 (two) times daily with a meal. Please keep your upcoming appointment for refills. 180 tablet 3   docusate sodium (COLACE) 100 MG capsule Take 2 capsules (200 mg total) by mouth 2 (two) times daily. (Patient taking differently: Take 100 mg by mouth daily as needed for moderate constipation.) 120 capsule 3   glucagon 1 MG injection Inject 1 mg into the skin once as needed (for low blood sugar).      Insulin Human (INSULIN PUMP) SOLN Inject into the skin.     losartan (COZAAR) 50 MG tablet Take 0.5 tablets (25 mg total) by mouth every other day. 23 tablet 3   Meth-Hyo-M Bl-Na Phos-Ph Sal (URIBEL) 118 MG CAPS Take 1 capsule (118 mg total) by mouth every 6 (six) hours as needed (dysuria). 40 capsule 3   Multiple Vitamin (MULTIVITAMIN WITH MINERALS) TABS tablet Take 1 tablet by mouth daily.     naproxen sodium (ALEVE) 220 MG tablet Take 220-440 mg by mouth 2 (two) times daily as needed (pain/headaches.).     NOVOLOG 100 UNIT/ML injection Inject 150 Units into the skin daily. Uses via inuslin pump  0   vitamin B-12 (  CYANOCOBALAMIN) 1000 MCG tablet Take 1,000 mcg by mouth every other day.     No current facility-administered medications for this visit.     Allergies:   Patient has no known allergies.   Social History:  The patient  reports that he quit smoking about 26 years ago. His smoking use included cigarettes. He has a 10.00 pack-year smoking history. He has never used smokeless tobacco. He reports that he does not drink alcohol and does not use drugs.   Family History:   family history includes Heart attack in his mother; Heart disease in his father and mother; Prostate cancer in his brother.   Review of Systems: Review of Systems  Constitutional: Negative.   Respiratory: Negative.    Cardiovascular: Negative.   Gastrointestinal: Negative.   Musculoskeletal: Negative.    Neurological: Negative.   Psychiatric/Behavioral: Negative.    All other systems reviewed and are negative.  PHYSICAL EXAM: VS:  BP 132/66 (BP Location: Left Arm, Patient Position: Sitting, Cuff Size: Normal)    Pulse 81    Ht 5\' 9"  (1.753 m)    Wt 202 lb 8 oz (91.9 kg)    SpO2 98%    BMI 29.90 kg/m  , BMI Body mass index is 29.9 kg/m. Constitutional:  oriented to person, place, and time. No distress.  HENT:  Head: Grossly normal Eyes:  no discharge. No scleral icterus.  Neck: No JVD, no carotid bruits  Cardiovascular: Regular rate and rhythm, no murmurs appreciated 1-2+ pitting lower extremity edema Pulmonary/Chest: Clear to auscultation bilaterally, no wheezes or rails Abdominal: Soft.  no distension.  no tenderness.  Musculoskeletal: Normal range of motion Neurological:  normal muscle tone. Coordination normal. No atrophy Skin: Skin warm and dry Psychiatric: normal affect, pleasant  Recent Labs: No results found for requested labs within last 8760 hours.    Lipid Panel Lab Results  Component Value Date   CHOL 101 06/08/2018   HDL 57 06/08/2018   LDLCALC 37 06/08/2018   TRIG 34 06/08/2018      Wt Readings from Last 3 Encounters:  11/26/21 202 lb 8 oz (91.9 kg)  11/15/20 185 lb (83.9 kg)  10/10/20 180 lb (81.6 kg)     ASSESSMENT AND PLAN:  Preop cardiovascular evaluation Acceptable risk for urologic procedure, no further testing needed Scheduled later this week  Atherosclerosis of native coronary artery of native heart without angina pectoris -  Known CAD,  stent x2 seen in the LAD  mild circumflex and RCA calcification -Cholesterol at goal,  current non-smoker Denies any chest pain concerning for angina No further testing needed  HYPERTENSION, BENIGN - Plan: EKG 12-Lead Blood pressure is well controlled on today's visit. No changes made to the medications.  Hyperlipidemia Cholesterol mildly above goal but prior to that was at goal Will wait for new  lipid panel for review  Controlled type 2 diabetes mellitus with other circulatory complication, without long-term current use of insulin ([name])  hemoglobin A1c 7 to 8 He has had diabetes for 50 years Followed by endocrinology Weight stable  Lymphedema Wears compressions Avoid calcium channel blockers Consider OT consult to lymphedema therapy   Total encounter time more than 25 minutes  Greater than 50% was spent in counseling and coordination of care with the patient     Orders Placed This Encounter  Procedures   EKG 12-Lead    Total encounter time more than 15 minutes  Greater than 50% was spent in counseling and coordination of care with the  patient   Signed, Esmond Plants, M.D., Ph.D. 11/26/2021  [place], [place]

## 2021-11-26 ENCOUNTER — Other Ambulatory Visit: Payer: Self-pay

## 2021-11-26 ENCOUNTER — Encounter: Payer: Self-pay | Admitting: Cardiovascular Disease

## 2021-11-26 ENCOUNTER — Ambulatory Visit: Payer: Medicare HMO | Admitting: Cardiovascular Disease

## 2021-11-26 VITALS — BP 132/66 | HR 81 | Ht 69.0 in | Wt 202.5 lb

## 2021-11-26 DIAGNOSIS — E782 Mixed hyperlipidemia: Secondary | ICD-10-CM | POA: Diagnosis not present

## 2021-11-26 DIAGNOSIS — Z7689 Persons encountering health services in other specified circumstances: Secondary | ICD-10-CM

## 2021-11-26 DIAGNOSIS — E1159 Type 2 diabetes mellitus with other circulatory complications: Secondary | ICD-10-CM | POA: Diagnosis not present

## 2021-11-26 DIAGNOSIS — I10 Essential (primary) hypertension: Secondary | ICD-10-CM | POA: Diagnosis not present

## 2021-11-26 DIAGNOSIS — R600 Localized edema: Secondary | ICD-10-CM | POA: Diagnosis not present

## 2021-11-26 DIAGNOSIS — I25118 Atherosclerotic heart disease of native coronary artery with other forms of angina pectoris: Secondary | ICD-10-CM | POA: Diagnosis not present

## 2021-11-26 NOTE — Patient Instructions (Addendum)
Consult to OT, [employer], was placed, their office will call for first appt  Medication Instructions:  No changes  If you need a refill on your cardiac medications before your next appointment, please call your pharmacy.   Lab work: No new labs needed  Testing/Procedures: No new testing needed  Follow-Up: At [facility], you and your health needs are our priority.  As part of our continuing mission to provide you with exceptional heart care, we have created designated Provider Care Teams.  These Care Teams include your primary Cardiologist (physician) and Advanced Practice Providers (APPs -  Physician Assistants and Nurse Practitioners) who all work together to provide you with the care you need, when you need it.  You will need a follow up appointment in 12 months  Providers on your designated Care Team:   Murray Hodgkins, NP Christell Faith, [name] Cadence Kathlen Mody, [place]  COVID-19 Vaccine Information can be found at: [email or web address] For questions related to vaccine distribution or appointments, please email [email or web address] or call [phone].

## 2021-11-27 ENCOUNTER — Other Ambulatory Visit
Admission: RE | Admit: 2021-11-27 | Discharge: 2021-11-27 | Disposition: A | Discharge status: Home / Self Care | Payer: Medicare HMO | Source: Ambulatory Visit | Attending: Urology | Admitting: Urology

## 2021-11-27 DIAGNOSIS — Z87438 Personal history of other diseases of male genital organs: Secondary | ICD-10-CM | POA: Diagnosis not present

## 2021-11-27 DIAGNOSIS — Z8551 Personal history of malignant neoplasm of bladder: Secondary | ICD-10-CM | POA: Diagnosis not present

## 2021-11-27 DIAGNOSIS — I10 Essential (primary) hypertension: Secondary | ICD-10-CM | POA: Insufficient documentation

## 2021-11-27 DIAGNOSIS — Z01812 Encounter for preprocedural laboratory examination: Secondary | ICD-10-CM | POA: Diagnosis not present

## 2021-11-27 DIAGNOSIS — Z7982 Long term (current) use of aspirin: Secondary | ICD-10-CM | POA: Diagnosis not present

## 2021-11-27 DIAGNOSIS — N35914 Unspecified anterior urethral stricture, male: Secondary | ICD-10-CM | POA: Diagnosis not present

## 2021-11-27 DIAGNOSIS — Z87891 Personal history of nicotine dependence: Secondary | ICD-10-CM | POA: Diagnosis not present

## 2021-11-27 DIAGNOSIS — R3911 Hesitancy of micturition: Secondary | ICD-10-CM | POA: Diagnosis present

## 2021-11-27 DIAGNOSIS — E103312 Type 1 diabetes mellitus with moderate nonproliferative diabetic retinopathy with macular edema, left eye: Secondary | ICD-10-CM | POA: Diagnosis not present

## 2021-11-27 DIAGNOSIS — N35919 Unspecified urethral stricture, male, unspecified site: Secondary | ICD-10-CM | POA: Diagnosis not present

## 2021-11-27 DIAGNOSIS — Z87442 Personal history of urinary calculi: Secondary | ICD-10-CM | POA: Diagnosis not present

## 2021-11-27 DIAGNOSIS — I252 Old myocardial infarction: Secondary | ICD-10-CM | POA: Diagnosis not present

## 2021-11-27 DIAGNOSIS — I2510 Atherosclerotic heart disease of native coronary artery without angina pectoris: Secondary | ICD-10-CM | POA: Diagnosis not present

## 2021-11-27 DIAGNOSIS — E1151 Type 2 diabetes mellitus with diabetic peripheral angiopathy without gangrene: Secondary | ICD-10-CM | POA: Diagnosis not present

## 2021-11-27 DIAGNOSIS — Z7902 Long term (current) use of antithrombotics/antiplatelets: Secondary | ICD-10-CM | POA: Diagnosis not present

## 2021-11-27 DIAGNOSIS — Z8616 Personal history of COVID-19: Secondary | ICD-10-CM | POA: Diagnosis not present

## 2021-11-27 LAB — CBC
HCT: 38.8 % — ABNORMAL LOW (ref 39.0–52.0)
Hemoglobin: 12.9 g/dL — ABNORMAL LOW (ref 13.0–17.0)
MCH: 31.5 pg (ref 26.0–34.0)
MCHC: 33.2 g/dL (ref 30.0–36.0)
MCV: 94.6 fL (ref 80.0–100.0)
Platelets: 248 10*3/uL (ref 150–400)
RBC: 4.1 MIL/uL — ABNORMAL LOW (ref 4.22–5.81)
RDW: 14 % (ref 11.5–15.5)
WBC: 8.2 10*3/uL (ref 4.0–10.5)
nRBC: 0 % (ref 0.0–0.2)

## 2021-11-27 LAB — BASIC METABOLIC PANEL
Anion gap: 6 (ref 5–15)
BUN: 25 mg/dL — ABNORMAL HIGH (ref 8–23)
CO2: 28 mmol/L (ref 22–32)
Calcium: 9 mg/dL (ref 8.9–10.3)
Chloride: 104 mmol/L (ref 98–111)
Creatinine, Ser: 0.89 mg/dL (ref 0.61–1.24)
GFR, Estimated: >60 mL/min (ref >60)
Glucose, Bld: 143 mg/dL — ABNORMAL HIGH (ref 70–99)
Potassium: 4.2 mmol/L (ref 3.5–5.1)
Sodium: 138 mmol/L (ref 135–145)

## 2021-11-27 LAB — PSA: Prostatic Specific Antigen: 4.42 ng/mL — ABNORMAL HIGH (ref 0.00–4.00)

## 2021-11-28 MED ORDER — CHLORHEXIDINE GLUCONATE 0.12 % MT SOLN
15.0000 mL | Freq: Once | OROMUCOSAL | Status: AC
  Administered 2021-11-29: 15 mL via OROMUCOSAL

## 2021-11-28 MED ORDER — FAMOTIDINE 20 MG PO TABS
20.0000 mg | ORAL_TABLET | Freq: Once | ORAL | Status: AC
  Administered 2021-11-29: 20 mg via ORAL

## 2021-11-28 MED ORDER — SODIUM CHLORIDE 0.9 % IV SOLN: INTRAVENOUS | Status: DC

## 2021-11-28 MED ORDER — ORAL CARE MOUTH RINSE: 15.0000 mL | Freq: Once | OROMUCOSAL | Status: AC

## 2021-11-29 ENCOUNTER — Encounter: Payer: Self-pay | Admitting: Urology

## 2021-11-29 ENCOUNTER — Ambulatory Visit: Payer: Medicare HMO | Admitting: Anesthesiology

## 2021-11-29 ENCOUNTER — Other Ambulatory Visit: Payer: Self-pay

## 2021-11-29 ENCOUNTER — Ambulatory Visit: Payer: Medicare HMO

## 2021-11-29 ENCOUNTER — Encounter
Admission: RE | Disposition: A | Discharge status: Home / Self Care | Payer: Self-pay | Source: Home / Self Care | Attending: Urology

## 2021-11-29 ENCOUNTER — Ambulatory Visit
Admission: RE | Admit: 2021-11-29 | Discharge: 2021-11-29 | Disposition: A | Discharge status: Home / Self Care | Payer: Medicare HMO | Attending: Urology | Admitting: Urology

## 2021-11-29 DIAGNOSIS — I2510 Atherosclerotic heart disease of native coronary artery without angina pectoris: Secondary | ICD-10-CM | POA: Insufficient documentation

## 2021-11-29 DIAGNOSIS — Z8616 Personal history of COVID-19: Secondary | ICD-10-CM | POA: Insufficient documentation

## 2021-11-29 DIAGNOSIS — Z8551 Personal history of malignant neoplasm of bladder: Secondary | ICD-10-CM | POA: Diagnosis not present

## 2021-11-29 DIAGNOSIS — Z7902 Long term (current) use of antithrombotics/antiplatelets: Secondary | ICD-10-CM | POA: Insufficient documentation

## 2021-11-29 DIAGNOSIS — N35919 Unspecified urethral stricture, male, unspecified site: Secondary | ICD-10-CM | POA: Diagnosis not present

## 2021-11-29 DIAGNOSIS — K219 Gastro-esophageal reflux disease without esophagitis: Secondary | ICD-10-CM | POA: Diagnosis not present

## 2021-11-29 DIAGNOSIS — E1151 Type 2 diabetes mellitus with diabetic peripheral angiopathy without gangrene: Secondary | ICD-10-CM | POA: Diagnosis not present

## 2021-11-29 DIAGNOSIS — I10 Essential (primary) hypertension: Secondary | ICD-10-CM | POA: Diagnosis not present

## 2021-11-29 DIAGNOSIS — N35914 Unspecified anterior urethral stricture, male: Secondary | ICD-10-CM | POA: Insufficient documentation

## 2021-11-29 DIAGNOSIS — I252 Old myocardial infarction: Secondary | ICD-10-CM | POA: Diagnosis not present

## 2021-11-29 DIAGNOSIS — Z87438 Personal history of other diseases of male genital organs: Secondary | ICD-10-CM | POA: Insufficient documentation

## 2021-11-29 DIAGNOSIS — N35814 Other anterior urethral stricture, male: Secondary | ICD-10-CM | POA: Diagnosis not present

## 2021-11-29 DIAGNOSIS — Z87442 Personal history of urinary calculi: Secondary | ICD-10-CM | POA: Insufficient documentation

## 2021-11-29 DIAGNOSIS — Z7982 Long term (current) use of aspirin: Secondary | ICD-10-CM | POA: Insufficient documentation

## 2021-11-29 DIAGNOSIS — Z87891 Personal history of nicotine dependence: Secondary | ICD-10-CM | POA: Diagnosis not present

## 2021-11-29 HISTORY — PX: CYSTOSCOPY WITH DIRECT VISION INTERNAL URETHROTOMY: SHX6637

## 2021-11-29 LAB — GLUCOSE, CAPILLARY
Glucose-Capillary: 210 mg/dL — ABNORMAL HIGH (ref 70–99)
Glucose-Capillary: 253 mg/dL — ABNORMAL HIGH (ref 70–99)

## 2021-11-29 SURGERY — CYSTOSCOPY, WITH DIRECT VISION INTERNAL URETHROTOMY
Anesthesia: General | Site: Urethra

## 2021-11-29 MED ORDER — CEFAZOLIN SODIUM-DEXTROSE 1-4 GM/50ML-% IV SOLN
INTRAVENOUS | Status: AC
  Filled 2021-11-29: qty 50

## 2021-11-29 MED ORDER — ONDANSETRON HCL 4 MG/2ML IJ SOLN
INTRAMUSCULAR | Status: DC | PRN
Start: 2021-11-29 — End: 2021-11-29
  Administered 2021-11-29: 4 mg via INTRAVENOUS

## 2021-11-29 MED ORDER — ACETAMINOPHEN 10 MG/ML IV SOLN
INTRAVENOUS | Status: DC | PRN
  Administered 2021-11-29: 1000 mg via INTRAVENOUS

## 2021-11-29 MED ORDER — CEFAZOLIN SODIUM-DEXTROSE 1-4 GM/50ML-% IV SOLN
1.0000 g | Freq: Once | INTRAVENOUS | Status: AC
  Administered 2021-11-29: 1 g via INTRAVENOUS

## 2021-11-29 MED ORDER — DEXAMETHASONE SODIUM PHOSPHATE 10 MG/ML IJ SOLN
INTRAMUSCULAR | Status: DC | PRN
  Administered 2021-11-29: 10 mg via INTRAVENOUS

## 2021-11-29 MED ORDER — EPHEDRINE 5 MG/ML INJ
INTRAVENOUS | Status: AC
  Filled 2021-11-29: qty 5

## 2021-11-29 MED ORDER — CHLORHEXIDINE GLUCONATE 0.12 % MT SOLN
OROMUCOSAL | Status: AC
  Filled 2021-11-29: qty 15

## 2021-11-29 MED ORDER — DEXAMETHASONE SODIUM PHOSPHATE 10 MG/ML IJ SOLN
INTRAMUSCULAR | Status: AC
  Filled 2021-11-29: qty 1

## 2021-11-29 MED ORDER — ONDANSETRON HCL 4 MG/2ML IJ SOLN
INTRAMUSCULAR | Status: AC
  Filled 2021-11-29: qty 2

## 2021-11-29 MED ORDER — FENTANYL CITRATE (PF) 100 MCG/2ML IJ SOLN
INTRAMUSCULAR | Status: AC
  Filled 2021-11-29: qty 2

## 2021-11-29 MED ORDER — CIPROFLOXACIN HCL 500 MG PO TABS: 500.0000 mg | ORAL_TABLET | Freq: Two times a day (BID) | ORAL | 0 refills | Status: DC

## 2021-11-29 MED ORDER — FENTANYL CITRATE (PF) 100 MCG/2ML IJ SOLN
INTRAMUSCULAR | Status: DC | PRN
  Administered 2021-11-29 (×3): 25 ug via INTRAVENOUS

## 2021-11-29 MED ORDER — LIDOCAINE HCL (CARDIAC) PF 100 MG/5ML IV SOSY
PREFILLED_SYRINGE | INTRAVENOUS | Status: DC | PRN
  Administered 2021-11-29: 40 mg via INTRAVENOUS
  Administered 2021-11-29: 60 mg via INTRAVENOUS

## 2021-11-29 MED ORDER — PHENYLEPHRINE HCL (PRESSORS) 10 MG/ML IV SOLN
INTRAVENOUS | Status: DC | PRN
  Administered 2021-11-29: 160 ug via INTRAVENOUS
  Administered 2021-11-29 (×2): 80 ug via INTRAVENOUS

## 2021-11-29 MED ORDER — LIDOCAINE HCL (PF) 2 % IJ SOLN
INTRAMUSCULAR | Status: AC
  Filled 2021-11-29: qty 5

## 2021-11-29 MED ORDER — WATER FOR IRRIGATION, STERILE IR SOLN
Status: DC | PRN
  Administered 2021-11-29: 500 mL via URETHRAL

## 2021-11-29 MED ORDER — ACETAMINOPHEN 10 MG/ML IV SOLN
INTRAVENOUS | Status: AC
  Filled 2021-11-29: qty 100

## 2021-11-29 MED ORDER — PROPOFOL 10 MG/ML IV BOLUS
INTRAVENOUS | Status: AC
  Filled 2021-11-29: qty 20

## 2021-11-29 MED ORDER — HYOSCYAMINE SULFATE SL 0.125 MG SL SUBL: 0.1250 mg | SUBLINGUAL_TABLET | SUBLINGUAL | 3 refills | Status: AC | PRN

## 2021-11-29 MED ORDER — OXYCODONE HCL 5 MG/5ML PO SOLN: 5.0000 mg | Freq: Once | ORAL | Status: DC | PRN

## 2021-11-29 MED ORDER — FAMOTIDINE 20 MG PO TABS
ORAL_TABLET | ORAL | Status: AC
  Filled 2021-11-29: qty 1

## 2021-11-29 MED ORDER — FENTANYL CITRATE (PF) 100 MCG/2ML IJ SOLN: 25.0000 ug | INTRAMUSCULAR | Status: DC | PRN

## 2021-11-29 MED ORDER — OXYCODONE HCL 5 MG PO TABS: 5.0000 mg | ORAL_TABLET | Freq: Once | ORAL | Status: DC | PRN

## 2021-11-29 MED ORDER — PROPOFOL 10 MG/ML IV BOLUS
INTRAVENOUS | Status: DC | PRN
  Administered 2021-11-29: 150 mg via INTRAVENOUS
  Administered 2021-11-29: 50 mg via INTRAVENOUS

## 2021-11-29 SURGICAL SUPPLY — 24 items
BAG DRAIN CYSTO-URO LG1000N (MISCELLANEOUS) ×2 IMPLANT
BAG DRN RND TRDRP ANRFLXCHMBR (UROLOGICAL SUPPLIES) ×1
BAG URINE DRAIN 2000ML AR STRL (UROLOGICAL SUPPLIES) ×2 IMPLANT
BALLN OPTILUME DCB 30X5X75 (BALLOONS) ×2
BALLOON OPTILUME DCB 30X5X75 (BALLOONS) IMPLANT
CATH FOLEY 2WAY  5CC 20FR SIL (CATHETERS)
CATH FOLEY 2WAY 5CC 20FR SIL (CATHETERS) ×1 IMPLANT
CATH FOLEY SIL 2WAY 14FR5CC (CATHETERS) ×1 IMPLANT
DEVICE INFLATION ATRION QL4015 (MISCELLANEOUS) ×1 IMPLANT
GAUZE 4X4 16PLY ~~LOC~~+RFID DBL (SPONGE) ×4 IMPLANT
GLOVE SURG ENC MOIS LTX SZ7 (GLOVE) ×4 IMPLANT
GLOVE SURG ENC MOIS LTX SZ7.5 (GLOVE) ×2 IMPLANT
GLOVE SURG UNDER POLY LF SZ7 (GLOVE) ×2 IMPLANT
GOWN STRL REUS W/ TWL LRG LVL3 (GOWN DISPOSABLE) ×1 IMPLANT
GOWN STRL REUS W/ TWL XL LVL3 (GOWN DISPOSABLE) ×1 IMPLANT
GOWN STRL REUS W/TWL LRG LVL3 (GOWN DISPOSABLE) ×2
GOWN STRL REUS W/TWL XL LVL3 (GOWN DISPOSABLE) ×2
KIT TURNOVER CYSTO (KITS) ×2 IMPLANT
PACK CYSTO AR (MISCELLANEOUS) ×2 IMPLANT
SET CYSTO W/LG BORE CLAMP LF (SET/KITS/TRAYS/PACK) ×2 IMPLANT
WATER STERILE IRR 1000ML POUR (IV SOLUTION) ×2 IMPLANT
WATER STERILE IRR 3000ML UROMA (IV SOLUTION) ×2 IMPLANT
WATER STERILE IRR 500ML POUR (IV SOLUTION) ×2 IMPLANT
WIRE G XSTIFF 025X145 (WIRE) ×2 IMPLANT

## 2021-11-29 NOTE — Discharge Instructions (Addendum)
Urethrotomy, Care After This sheet gives you information about how to care for yourself after your procedure. Your health care provider may also give you more specific instructions. If you have problems or questions, contact your health care provider. What can I expect after the procedure? After the procedure, it is common to have: Burning pain when urinating. Pain or discomfort in your genital area. A small amount of blood in your urine. Your health care provider will tell you how long you can expect to have blood in your urine. Bloody urine leaking from around your catheter. Follow these instructions at home: Catheter and drainage bag Follow instructions from your health care provider about how to care for your catheter and your drainage bag. Do not take baths, swim, or use a hot tub until your catheter has been removed. You may take showers while your catheter is in place. If you have to insert a catheter on your own (self-catheterization) after your catheter is removed, make sure you understand the procedure completely. Carefully follow instructions from your health care provider. Medicines Take over-the-counter and prescription medicines only as told by your health care provider. If you were prescribed an antibiotic medicine, take it as told by your health care provider. Do not stop taking the antibiotic even if you start to feel better. Ask your health care provider if the medicine prescribed to you: Requires you to avoid driving or using heavy machinery. Can cause constipation. You may need to take these actions to prevent or treat constipation: Take over-the-counter or prescription medicines. Eat foods that are high in fiber, such as beans, whole grains, and fresh fruits and vegetables. Limit foods that are high in fat and processed sugars, such as fried or sweet foods. Activity Do not drive for 24 hours if you were given a sedative during your procedure. Take short walks several  times a day during your recovery. Do not lift anything that is heavier than 10 lb (4.5 kg), or the limit that you are told, until your health care provider says that it is safe. Return to your normal activities as told by your health care provider. Ask your health care provider what activities are safe for you. Do not have sex until your health care provider says it is okay. General instructions Drink enough fluid to keep your urine pale yellow. If a bandage (dressing) was applied over the opening of your urethra, change the dressing as told by your health care provider. Make sure you: Wash your hands with soap and water before and after you change your dressing. If soap and water are not available, use hand sanitizer. Keep your dressing clean and dry. Do not use any products that contain nicotine or tobacco, such as cigarettes, e-cigarettes, and chewing tobacco. These can delay healing after surgery. If you need help quitting, ask your health care provider. Keep all follow-up visits as told by your health care provider. This is important. Contact a health care provider if you: Have a fever or chills. Have pain that gets worse or does not get better with medicine. Have blood in your urine for longer than your health care provider told you to expect. Are a male and have any of these problems: Trouble getting an erection. Pain when you have an erection. Blood in your semen. Have any of these problems after your catheter is removed: Trouble urinating. A slow urine stream. Urinating less than usual. Several streams or "spray" when you urinate. Have pain in your abdomen. Have swelling  in your genital area that does not go away. Get help right away if: You have severe pain. A lot of blood is leaking from around your catheter. You have blood clots in your urine. Your catheter stops draining urine. You cannot urinate after your catheter is removed. You have redness, warmth, or pain in your  leg. You have chest pain. You have trouble breathing. These symptoms may represent a serious problem that is an emergency. Do not wait to see if the symptoms will go away. Get medical help right away. Call your local emergency services (911 in the [place] Do not drive yourself to the hospital. Summary After the procedure, it is common to have burning pain when urinating and a small amount of blood in your urine. Follow instructions from your health care provider about how to care for your catheter and your drainage bag. Take short walks several times a day during your recovery. If a bandage (dressing) was applied over the opening of your urethra, change the dressing as told by your health care provider. Contact your health care provider if you have trouble urinating after your catheter is removed. This information is not intended to replace advice given to you by your health care provider. Make sure you discuss any questions you have with your health care provider. Document Revised: 05/03/2019 Document Reviewed: 05/03/2019 Elsevier Patient Education  [place], Adult An indwelling urinary catheter is a thin tube that is put into your bladder. The tube helps to drain pee (urine) out of your body. The tube goes in through your urethra. Your urethra is where pee comes out of your body. Your pee will come out through the catheter, then it will go into a bag (drainage bag). Take good care of your catheter so it will work well. What are the risks? Germs may get into your bladder and cause an infection. The tube can become blocked. Tissue near the catheter may become irritated and may bleed. How to wear your catheter and drainage bag Supplies needed Sticky tape (adhesive tape) or a leg strap. Alcohol wipe or soap and water (if you use tape). A clean towel (if you use tape). Large overnight bag. Smaller bag (leg bag). Wearing your catheter Attach  your catheter to your leg with tape or a leg strap. Make sure the catheter is not pulled tight. If a leg strap gets wet, take it off and put on a dry strap. If you use tape to hold the bag on your leg: Use an alcohol wipe or soap and water to wash your skin where the tape made it sticky before. Use a clean towel to pat-dry that skin. Use new tape to make the bag stay on your leg. Wearing your bags You should have been given a large overnight bag. You may wear the overnight bag in the day or night. Always have the overnight bag lower than your bladder.  Do not let the bag touch the floor. Before you go to sleep, put a clean plastic bag in a wastebasket. Then, hang the overnight bag inside the wastebasket. You should also have a smaller leg bag that fits under your clothes. Wear the leg bag as told by the product maker. This may be above or below the knee, depending on the length of the tubing. Make sure that the leg bag is below the bladder. Make sure that the tubing does not have loops or too much tension. Do  not wear your leg bag at night. How to care for your skin and catheter Supplies needed A clean washcloth. Water and mild soap. A clean towel. Caring for your skin and catheter Male anatomy showing the labia, urethra, and an indwelling urinary catheter in the bladder.    Male anatomy showing the penis, urethra, and an indwelling urinary catheter in the bladder.  Clean the skin around your catheter every day. Wash your hands with soap and water. Wet a clean washcloth in warm water and mild soap. Clean the skin around your urethra. If you are male: Gently spread the folds of skin around your vagina (labia). With the washcloth in your other hand, wipe the inner side of your labia on each side. Wipe from front to back. If you are male: Pull back any skin that covers the end of your penis (foreskin). With the washcloth in your other hand, wipe your penis in small circles.  Start wiping at the tip of your penis, then move away from the catheter. Move the foreskin back in place, if needed. With your free hand, hold the catheter close to where it goes into your body. Keep holding the catheter during cleaning so it does not get pulled out. With the washcloth in your other hand, clean the catheter. Only wipe downward on the catheter, toward the drainage bag. Do not wipe upward toward your body. Doing this may push germs into your urethra and cause infection. Use a clean towel to pat-dry the catheter and the skin around it. Make sure to wipe off all soap. Wash your hands with soap and water. Shower every day. Do not take baths. Do not use cream, ointment, or lotion on the area where the catheter goes into your body, unless your doctor tells you to. Do not use powders, sprays, or lotions on your genital area. Check your skin around the catheter every day for signs of infection. Check for: Redness, swelling, or pain. Fluid or blood. Warmth. Pus or a bad smell. How to empty the bag Supplies needed Rubbing alcohol. Gauze pad or cotton ball. Tape or a leg strap. Emptying the bag Pour the pee out of your bag when it is ?- full, or at least 2-3 times a day. Do this for your overnight bag and your leg bag. Wash your hands with soap and water. Separate (detach) the bag from your leg. Hold the bag over the toilet or a clean pail. Keep the bag lower than your hips and bladder. This is so the pee (urine) does not go back into the tube. Open the pour spout. It is at the bottom of the bag. Empty the pee into the toilet or pail. Do not let the pour spout touch any surface. Put rubbing alcohol on a gauze pad or cotton ball. Use the gauze pad or cotton ball to clean the pour spout. Close the pour spout. Attach the bag to your leg with tape or a leg strap. Wash your hands with soap and water. Follow instructions for cleaning the drainage bag. Instructions can come  from: The product maker. Your doctor. How to change the bag Changing the bag Replace your bag when it starts to leak, smell bad, or look dirty. Wash your hands with soap and water. Separate the dirty bag from your leg. Pinch the catheter with your fingers so that pee does not spill out. Separate the catheter tube from the bag tube where these tubes connect (at the connection valve). Do not let  the tubes touch any surface. Clean the end of the catheter tube with an alcohol wipe. Use a different alcohol wipe to clean the end of the bag tube. Connect the catheter tube to the tube of the clean bag. Attach the clean bag to your leg with tape or a leg strap. Do not make the bag tight on your leg. Wash your hands with soap and water. General instructions A person washing hands with soap and water.  Never pull on your catheter. Never try to take it out. Doing that can hurt you. Always wash your hands before and after you touch your catheter or bag. Use a mild, fragrance-free soap. If you do not have soap and water, use hand sanitizer. Always make sure there are no twists, bends, or kinks in the catheter tube. Always make sure there are no leaks in the catheter or bag. Drink enough fluid to keep your pee pale yellow. Do not take baths, swim, or use a hot tub. If you are male, wipe from front to back after you poop (have a bowel movement). Contact a doctor if: Your catheter gets clogged. Your catheter leaks. You have signs of infection at the catheter site, such as: Redness, swelling, or pain where the catheter goes into your body. Fluid, blood, pus, or a bad smell coming from the area where the catheter goes into your body. Skin feels warm where the catheter goes into your body. You have signs of a bladder infection, such as: Fever. Chills. Pee smells worse than usual. Cloudy pee. Pain in your belly, legs, lower back, or bladder. Vomiting or feel like vomiting. Get help right away  if: You see blood in the catheter. Your pee is pink or red. Your bladder feels full. Your pee is not draining into the bag. Your catheter gets pulled out. Summary An indwelling urinary catheter is a thin tube that is placed into the bladder to help drain pee (urine) out of the body. The catheter is placed into the part of the body that drains pee from the bladder (urethra). Taking good care of your catheter will keep it working well. Always wash your hands before and after touching your catheter or bag. Never pull on your catheter or try to take it out. This information is not intended to replace advice given to you by your health care provider. Make sure you discuss any questions you have with your health care provider. Document Revised: 06/28/2021 Document Reviewed: 06/28/2021 Elsevier Patient Education  2022 [place]   The drugs that you were given will stay in your system until tomorrow so for the next 24 hours you should not:  Drive an automobile Make any legal decisions Drink any alcoholic beverage   You may resume regular meals tomorrow.  Today it is better to start with liquids and gradually work up to solid foods.  You may eat anything you prefer, but it is better to start with liquids, then soup and crackers, and gradually work up to solid foods.   Please notify your doctor immediately if you have any unusual bleeding, trouble breathing, redness and pain at the surgery site, drainage, fever, or pain not relieved by medication.    Additional Instructions:

## 2021-11-29 NOTE — Anesthesia Procedure Notes (Signed)
Procedure Name: LMA Insertion Date/Time: 11/29/2021 8:45 AM Performed by: Jonna Clark, CRNA Pre-anesthesia Checklist: Patient identified, Patient being monitored, Timeout performed, Emergency Drugs available and Suction available Patient Re-evaluated:Patient Re-evaluated prior to induction Oxygen Delivery Method: Circle system utilized Preoxygenation: Pre-oxygenation with 100% oxygen Induction Type: IV induction Ventilation: Mask ventilation without difficulty LMA: LMA inserted LMA Size: 4.0 Tube type: Oral Number of attempts: 1 Placement Confirmation: positive ETCO2 and breath sounds checked- equal and bilateral Tube secured with: Tape Dental Injury: Teeth and Oropharynx as per pre-operative assessment

## 2021-11-29 NOTE — Anesthesia Preprocedure Evaluation (Signed)
Anesthesia Evaluation  Patient identified by MRN, date of birth, ID band Patient awake    Reviewed: Allergy & Precautions, NPO status , Patient's Chart, lab work & pertinent test results  History of Anesthesia Complications Negative for: history of anesthetic complications  Airway Mallampati: III  TM Distance: >3 FB Neck ROM: full    Dental  (+) Chipped, Poor Dentition, Missing   Pulmonary neg shortness of breath, former smoker,    Pulmonary exam normal        Cardiovascular hypertension, (-) angina+ CAD, + Past MI and + Peripheral Vascular Disease  Normal cardiovascular exam     Neuro/Psych negative neurological ROS  negative psych ROS   GI/Hepatic Neg liver ROS, GERD  Controlled,  Endo/Other  diabetes, Type 2  Renal/GU      Musculoskeletal  (+) Arthritis ,   Abdominal   Peds  Hematology negative hematology ROS (+)   Anesthesia Other Findings Past Medical History: No date: Anemia No date: Arthritis     Comment:  right knee No date: Cancer [facility])     Comment:  BLADDER CANCER (2018)skin cancer 04/2020: Cataract No date: Coronary artery disease 09/2021: COVID-19 No date: Diabetes mellitus No date: Edema No date: GERD (gastroesophageal reflux disease)     Comment:  rare-NO MEDS 2007: Heart attack ([place]) No date: History of kidney stones No date: Hyperlipidemia No date: Hypertension No date: Hypoglycemia     Comment:  history of episodes x2 No date: Ischemic cardiomyopathy No date: Myocardial infarction ([place]) No date: Peripheral vascular disease ([name])     Comment:  swelling in Lt ankle due to prior injury  Past Surgical History: No date: CARDIAC CATHETERIZATION 04/18/2020: CATARACT EXTRACTION W/PHACO; Left     Comment:  Procedure: CATARACT EXTRACTION PHACO AND INTRAOCULAR               LENS PLACEMENT (IOC) LEFT DIABETIC 14.60  01:57.1;                Surgeon: Birder Robson, MD;  Location: [place];  Service: Ophthalmology;  Laterality: Left;                Diabetic - insulin pump 10/10/2020: CATARACT EXTRACTION W/PHACO; Right     Comment:  Procedure: CATARACT EXTRACTION PHACO AND INTRAOCULAR               LENS PLACEMENT (IOC) RIGHT DIABETIC 12.59 01:44.7;                Surgeon: Birder Robson, MD;  Location: [facility];                Service: Ophthalmology;  Laterality: Right;  Diabetic -               insulin pump 02/18/2018: COLONOSCOPY WITH PROPOFOL; N/A     Comment:  Procedure: COLONOSCOPY WITH PROPOFOL;  Surgeon: [place],               Benay Pike, MD;  Location: [facility];  Service:               Gastroenterology;  Laterality: N/A; No date: CORONARY ANGIOPLASTY No date: CORONARY STENT PLACEMENT     Comment:  To an occluded LAD x2 10/21/2017: CYSTOSCOPY WITH BIOPSY; N/A     Comment:  Procedure: CYSTOSCOPY WITH BLADDER BIOPSY;  Surgeon:  Royston Cowper, MD;  Location: [facility];  Service:               Urology;  Laterality: N/A; 12/08/2018: CYSTOSCOPY WITH BIOPSY; N/A     Comment:  Procedure: CYSTOSCOPY WITH BLADDER BIOPSY-MITOMYCIN;                Surgeon: Royston Cowper, MD;  Location: [facility];                Service: Urology;  Laterality: N/A; 03/02/2020: CYSTOSCOPY WITH DIRECT VISION INTERNAL URETHROTOMY     Comment:  Procedure: CYSTOSCOPY WITH DIRECT VISION INTERNAL               URETHROTOMY;  Surgeon: Royston Cowper, MD;  Location:               [facility];  Service: Urology;; 09/14/2020: Consuela Mimes WITH DIRECT VISION INTERNAL URETHROTOMY; N/A     Comment:  Procedure: CYSTOSCOPY WITH DIRECT VISION INTERNAL               URETHROTOMY WITH HOLM LASER;  Surgeon: Royston Cowper,               MD;  Location: [facility];  Service: Urology;  Laterality:               N/A; 05/08/2020: CYSTOSCOPY WITH HOLMIUM LASER LITHOTRIPSY; N/A     Comment:  Procedure: CYSTOSCOPY WITH HOLMIUM LASER LITHOTRIPSY;                Surgeon: Royston Cowper, MD;  Location: [facility];                Service: Urology;  Laterality: N/A; 06/05/2016: EXCISION MASS LOWER EXTREMETIES; Right     Comment:  Procedure: EXCISION OF LESION RIGHT LOWER LEG;  Surgeon:              Hubbard Robinson, MD;  Location: [facility];  Service:               General;  Laterality: Right; 08/12/2019: GREEN LIGHT LASER TURP (TRANSURETHRAL RESECTION OF  PROSTATE; N/A     Comment:  Procedure: GREEN LIGHT LASER TURP (TRANSURETHRAL               RESECTION OF PROSTATE, BLADDER BIOPSY;  Surgeon: Royston Cowper, MD;  Location: [facility];  Service: Urology;                Laterality: N/A; 03/02/2020: HOLMIUM LASER APPLICATION     Comment:  Procedure: HOLMIUM LASER APPLICATION;  Surgeon: Royston Cowper, MD;  Location: [facility];  Service: Urology;;                Urethral stone  No date: SKIN CANCER EXCISION     Comment:  chest 07/15/2017: TRANSURETHRAL RESECTION OF BLADDER TUMOR; N/A     Comment:  Procedure: TRANSURETHRAL RESECTION OF BLADDER TUMOR               (TURBT);  Surgeon: Royston Cowper, MD;  Location: [facility];  Service: Urology;  Laterality: N/A; 08/12/2019: URETERAL BIOPSY; N/A     Comment:  Procedure: URETERAL BIOPSY;  Surgeon: Royston Cowper,  MD;  Location: [facility];  Service: Urology;  Laterality:               N/A; 05/08/2020: URETHROTOMY; N/A     Comment:  Procedure: CYSTOSCOPY/URETHROTOMY;  Surgeon: Royston Cowper, MD;  Location: [facility];  Service: Urology;                Laterality: N/A;     Reproductive/Obstetrics negative OB ROS                             Anesthesia Physical Anesthesia Plan  ASA: 3  Anesthesia Plan: General LMA   Post-op Pain Management:    Induction: Intravenous  PONV Risk Score and Plan: Dexamethasone, Ondansetron, Midazolam and Treatment may vary due to age or medical condition  Airway Management Planned:  LMA  Additional Equipment:   Intra-op Plan:   Post-operative Plan: Extubation in OR  Informed Consent: I have reviewed the patients History and Physical, chart, labs and discussed the procedure including the risks, benefits and alternatives for the proposed anesthesia with the patient or authorized representative who has indicated his/her understanding and acceptance.     Dental Advisory Given  Plan Discussed with: Anesthesiologist, CRNA and Surgeon  Anesthesia Plan Comments: (Patient consented for risks of anesthesia including but not limited to:  - adverse reactions to medications - damage to eyes, teeth, lips or other oral mucosa - nerve damage due to positioning  - sore throat or hoarseness - Damage to heart, brain, nerves, lungs, other parts of body or loss of life  Patient voiced understanding.)        Anesthesia Quick Evaluation

## 2021-11-29 NOTE — H&P (Signed)
Date of Initial H&P: 09/13/22  History reviewed, patient examined, no change in status, stable for surgery. 

## 2021-11-29 NOTE — Transfer of Care (Signed)
Immediate Anesthesia Transfer of Care Note  Patient: [name]  Procedure(s) Performed: CYSTOSCOPY WITH DIRECT VISION INTERNAL URETHROTOMY OPTILUME (Urethra)  Patient Location: PACU  Anesthesia Type:General  Level of Consciousness: drowsy  Airway & Oxygen Therapy: Patient Spontanous Breathing  Post-op Assessment: Report given to RN and Post -op Vital signs reviewed and stable  Post vital signs: Reviewed and stable  Last Vitals:  Vitals Value Taken Time  BP 118/59 11/29/21 0923  Temp    Pulse 80 11/29/21 0926  Resp 14 11/29/21 0926  SpO2 97 % 11/29/21 0926  Vitals shown include unvalidated device data.  Last Pain:  Vitals:   11/29/21 0738  TempSrc: Temporal  PainSc: 0-No pain         Complications: No notable events documented.

## 2021-11-29 NOTE — Op Note (Signed)
Preoperative diagnosis: Urethral stricture disease (N35.814)  Postoperative diagnosis: Same  Procedure: 1.Cystoscopy with OptiLume procedure (CPT 5X000)                     2.  Fluoroscopy (CPT 76000)  Surgeon: Otelia Limes. Yves Dill MD  Anesthesia: General  Indications:See the history and physical. 76 year old (DOB 05/18/1946) male with a history of chronic urethral stricture disease, managed with periodic dilation in the office.  He was most recently dilated on 01/06.  He comes in now for Optilume dilatation of the stricture.After informed consent the above procedure(s) were requested     Technique and findings: After adequate general anesthesia been obtained the patient was placed into dorsal lithotomy position and the perineum was prepped and draped in usual fashion.  The 21 French cystoscope was coupled the camera and then visually advanced into the bladder.  Stricture was encountered in the proximal anterior urethra.  The stricture was approximately 1 cm in length.  The bladder was thoroughly inspected.  Both ureteral orifices were identified and had clear efflux.  The patient had a large posterior bladder diverticulum.  No bladder tumors were identified.  A 0.35 sensor wire was passed and curled into the bladder.  The cystoscope was placed just distal to the stricture and the 30 French by 5 cm Optilene balloon catheter positioned at the site of the stricture.  The balloon was then inflated and fluoroscopy showed good dilatation of the stricture.  The balloon was left inflated for 5 minutes and then deflated and removed.  The cystoscope and guidewire were then removed.  A 14 French Foley catheter was placed.  The procedure was then terminated and patient transferred to the recovery room in stable condition.

## 2021-11-30 NOTE — Anesthesia Postprocedure Evaluation (Signed)
Anesthesia Post Note  Patient: [name]  Procedure(s) Performed: CYSTOSCOPY WITH DIRECT VISION INTERNAL URETHROTOMY OPTILUME (Urethra)  Patient location during evaluation: PACU Anesthesia Type: General Level of consciousness: awake and alert Pain management: pain level controlled Vital Signs Assessment: post-procedure vital signs reviewed and stable Respiratory status: spontaneous breathing, nonlabored ventilation, respiratory function stable and patient connected to nasal cannula oxygen Cardiovascular status: blood pressure returned to baseline and stable Postop Assessment: no apparent nausea or vomiting Anesthetic complications: no   No notable events documented.   Last Vitals:  Vitals:   11/29/21 0945 11/29/21 1002  BP: 123/60 139/67  Pulse: 85 87  Resp: 14 16  Temp: (!) 36.2 C 36.4 C  SpO2: 97% 97%    Last Pain:  Vitals:   11/29/21 1002  TempSrc: Temporal  PainSc: 0-No pain                 Precious Haws [place]

## 2021-12-03 ENCOUNTER — Other Ambulatory Visit: Payer: Self-pay | Admitting: Urology

## 2021-12-03 DIAGNOSIS — R972 Elevated prostate specific antigen [PSA]: Secondary | ICD-10-CM

## 2021-12-03 DIAGNOSIS — E1042 Type 1 diabetes mellitus with diabetic polyneuropathy: Secondary | ICD-10-CM | POA: Diagnosis not present

## 2021-12-03 DIAGNOSIS — E103212 Type 1 diabetes mellitus with mild nonproliferative diabetic retinopathy with macular edema, left eye: Secondary | ICD-10-CM | POA: Diagnosis not present

## 2021-12-03 DIAGNOSIS — E038 Other specified hypothyroidism: Secondary | ICD-10-CM | POA: Diagnosis not present

## 2021-12-03 DIAGNOSIS — E1059 Type 1 diabetes mellitus with other circulatory complications: Secondary | ICD-10-CM | POA: Diagnosis not present

## 2021-12-03 DIAGNOSIS — R809 Proteinuria, unspecified: Secondary | ICD-10-CM | POA: Diagnosis not present

## 2021-12-03 DIAGNOSIS — Z9641 Presence of insulin pump (external) (internal): Secondary | ICD-10-CM | POA: Diagnosis not present

## 2021-12-03 DIAGNOSIS — E109 Type 1 diabetes mellitus without complications: Secondary | ICD-10-CM | POA: Diagnosis not present

## 2021-12-03 DIAGNOSIS — E1029 Type 1 diabetes mellitus with other diabetic kidney complication: Secondary | ICD-10-CM | POA: Diagnosis not present

## 2021-12-03 DIAGNOSIS — E1065 Type 1 diabetes mellitus with hyperglycemia: Secondary | ICD-10-CM | POA: Diagnosis not present

## 2021-12-12 ENCOUNTER — Ambulatory Visit: Payer: Medicare HMO

## 2021-12-12 ENCOUNTER — Other Ambulatory Visit: Payer: Self-pay | Admitting: Urology

## 2021-12-12 ENCOUNTER — Other Ambulatory Visit (HOSPITAL_COMMUNITY): Payer: Self-pay | Admitting: Urology

## 2021-12-12 DIAGNOSIS — R972 Elevated prostate specific antigen [PSA]: Secondary | ICD-10-CM

## 2021-12-19 ENCOUNTER — Other Ambulatory Visit: Payer: Self-pay

## 2021-12-19 ENCOUNTER — Ambulatory Visit
Admission: RE | Admit: 2021-12-19 | Discharge: 2021-12-19 | Disposition: A | Discharge status: Home / Self Care | Payer: Medicare HMO | Source: Ambulatory Visit | Attending: Urology | Admitting: Urology

## 2021-12-19 DIAGNOSIS — R972 Elevated prostate specific antigen [PSA]: Secondary | ICD-10-CM | POA: Diagnosis not present

## 2021-12-19 MED ORDER — GADOBUTROL 1 MMOL/ML IV SOLN
9.0000 mL | Freq: Once | INTRAVENOUS | Status: AC | PRN
  Administered 2021-12-19: 7.5 mL via INTRAVENOUS

## 2021-12-24 DIAGNOSIS — N390 Urinary tract infection, site not specified: Secondary | ICD-10-CM | POA: Diagnosis not present

## 2021-12-24 DIAGNOSIS — R300 Dysuria: Secondary | ICD-10-CM | POA: Diagnosis not present

## 2021-12-24 DIAGNOSIS — N35011 Post-traumatic bulbous urethral stricture: Secondary | ICD-10-CM | POA: Diagnosis not present

## 2021-12-24 DIAGNOSIS — R972 Elevated prostate specific antigen [PSA]: Secondary | ICD-10-CM | POA: Diagnosis not present

## 2022-01-14 DIAGNOSIS — E103312 Type 1 diabetes mellitus with moderate nonproliferative diabetic retinopathy with macular edema, left eye: Secondary | ICD-10-CM | POA: Diagnosis not present

## 2022-01-14 DIAGNOSIS — E11311 Type 2 diabetes mellitus with unspecified diabetic retinopathy with macular edema: Secondary | ICD-10-CM | POA: Diagnosis not present

## 2022-01-24 DIAGNOSIS — N401 Enlarged prostate with lower urinary tract symptoms: Secondary | ICD-10-CM | POA: Diagnosis not present

## 2022-02-06 DIAGNOSIS — Z8551 Personal history of malignant neoplasm of bladder: Secondary | ICD-10-CM | POA: Diagnosis not present

## 2022-02-06 DIAGNOSIS — N35011 Post-traumatic bulbous urethral stricture: Secondary | ICD-10-CM | POA: Diagnosis not present

## 2022-02-06 DIAGNOSIS — N3281 Overactive bladder: Secondary | ICD-10-CM | POA: Diagnosis not present

## 2022-02-06 DIAGNOSIS — R972 Elevated prostate specific antigen [PSA]: Secondary | ICD-10-CM | POA: Diagnosis not present

## 2022-02-06 DIAGNOSIS — N401 Enlarged prostate with lower urinary tract symptoms: Secondary | ICD-10-CM | POA: Diagnosis not present

## 2022-02-06 DIAGNOSIS — N39498 Other specified urinary incontinence: Secondary | ICD-10-CM | POA: Diagnosis not present

## 2022-02-11 DIAGNOSIS — H26492 Other secondary cataract, left eye: Secondary | ICD-10-CM | POA: Diagnosis not present

## 2022-02-13 ENCOUNTER — Other Ambulatory Visit: Payer: Self-pay | Admitting: Cardiovascular Disease

## 2022-02-19 DIAGNOSIS — E103312 Type 1 diabetes mellitus with moderate nonproliferative diabetic retinopathy with macular edema, left eye: Secondary | ICD-10-CM | POA: Diagnosis not present

## 2022-02-22 DIAGNOSIS — R300 Dysuria: Secondary | ICD-10-CM | POA: Diagnosis not present

## 2022-02-22 DIAGNOSIS — N3281 Overactive bladder: Secondary | ICD-10-CM | POA: Diagnosis not present

## 2022-02-22 DIAGNOSIS — N390 Urinary tract infection, site not specified: Secondary | ICD-10-CM | POA: Diagnosis not present

## 2022-03-08 DIAGNOSIS — E1065 Type 1 diabetes mellitus with hyperglycemia: Secondary | ICD-10-CM | POA: Diagnosis not present

## 2022-03-08 DIAGNOSIS — E109 Type 1 diabetes mellitus without complications: Secondary | ICD-10-CM | POA: Diagnosis not present

## 2022-03-21 DIAGNOSIS — E038 Other specified hypothyroidism: Secondary | ICD-10-CM | POA: Diagnosis not present

## 2022-03-21 DIAGNOSIS — E103212 Type 1 diabetes mellitus with mild nonproliferative diabetic retinopathy with macular edema, left eye: Secondary | ICD-10-CM | POA: Diagnosis not present

## 2022-03-22 DIAGNOSIS — M549 Dorsalgia, unspecified: Secondary | ICD-10-CM | POA: Diagnosis not present

## 2022-03-22 DIAGNOSIS — S39012A Strain of muscle, fascia and tendon of lower back, initial encounter: Secondary | ICD-10-CM | POA: Diagnosis not present

## 2022-03-25 ENCOUNTER — Emergency Department
Admission: EM | Admit: 2022-03-25 | Discharge: 2022-03-25 | Disposition: A | Discharge status: Home / Self Care | Payer: Medicare HMO | Attending: Emergency Medicine | Admitting: Emergency Medicine

## 2022-03-25 ENCOUNTER — Other Ambulatory Visit: Payer: Self-pay

## 2022-03-25 ENCOUNTER — Encounter: Payer: Self-pay | Admitting: Emergency Medicine

## 2022-03-25 DIAGNOSIS — E860 Dehydration: Secondary | ICD-10-CM | POA: Insufficient documentation

## 2022-03-25 DIAGNOSIS — E1065 Type 1 diabetes mellitus with hyperglycemia: Secondary | ICD-10-CM | POA: Insufficient documentation

## 2022-03-25 DIAGNOSIS — R8289 Other abnormal findings on cytological and histological examination of urine: Secondary | ICD-10-CM | POA: Insufficient documentation

## 2022-03-25 DIAGNOSIS — I10 Essential (primary) hypertension: Secondary | ICD-10-CM | POA: Insufficient documentation

## 2022-03-25 DIAGNOSIS — R739 Hyperglycemia, unspecified: Secondary | ICD-10-CM | POA: Diagnosis present

## 2022-03-25 DIAGNOSIS — E1165 Type 2 diabetes mellitus with hyperglycemia: Secondary | ICD-10-CM | POA: Diagnosis not present

## 2022-03-25 DIAGNOSIS — E1069 Type 1 diabetes mellitus with other specified complication: Secondary | ICD-10-CM

## 2022-03-25 LAB — COMPREHENSIVE METABOLIC PANEL
ALT: 17 U/L (ref 0–44)
AST: 20 U/L (ref 15–41)
Albumin: 3.3 g/dL — ABNORMAL LOW (ref 3.5–5.0)
Alkaline Phosphatase: 86 U/L (ref 38–126)
Anion gap: 10 (ref 5–15)
BUN: 27 mg/dL — ABNORMAL HIGH (ref 8–23)
CO2: 27 mmol/L (ref 22–32)
Calcium: 9.4 mg/dL (ref 8.9–10.3)
Chloride: 101 mmol/L (ref 98–111)
Creatinine, Ser: 1.01 mg/dL (ref 0.61–1.24)
GFR, Estimated: >60 mL/min (ref >60)
Glucose, Bld: 125 mg/dL — ABNORMAL HIGH (ref 70–99)
Potassium: 4.2 mmol/L (ref 3.5–5.1)
Sodium: 138 mmol/L (ref 135–145)
Total Bilirubin: 0.8 mg/dL (ref 0.3–1.2)
Total Protein: 7.3 g/dL (ref 6.5–8.1)

## 2022-03-25 LAB — CBC WITH DIFFERENTIAL/PLATELET
Abs Immature Granulocytes: 0.09 10*3/uL — ABNORMAL HIGH (ref 0.00–0.07)
Basophils Absolute: 0.1 10*3/uL (ref 0.0–0.1)
Basophils Relative: 0 %
Eosinophils Absolute: 0 10*3/uL (ref 0.0–0.5)
Eosinophils Relative: 0 %
HCT: 41.4 % (ref 39.0–52.0)
Hemoglobin: 13.6 g/dL (ref 13.0–17.0)
Immature Granulocytes: 1 %
Lymphocytes Relative: 8 %
Lymphs Abs: 1.2 10*3/uL (ref 0.7–4.0)
MCH: 30.7 pg (ref 26.0–34.0)
MCHC: 32.9 g/dL (ref 30.0–36.0)
MCV: 93.5 fL (ref 80.0–100.0)
Monocytes Absolute: 1.6 10*3/uL — ABNORMAL HIGH (ref 0.1–1.0)
Monocytes Relative: 11 %
Neutro Abs: 11.6 10*3/uL — ABNORMAL HIGH (ref 1.7–7.7)
Neutrophils Relative %: 80 %
Platelets: 305 10*3/uL (ref 150–400)
RBC: 4.43 MIL/uL (ref 4.22–5.81)
RDW: 14 % (ref 11.5–15.5)
WBC: 14.6 10*3/uL — ABNORMAL HIGH (ref 4.0–10.5)
nRBC: 0 % (ref 0.0–0.2)

## 2022-03-25 LAB — URINALYSIS, ROUTINE W REFLEX MICROSCOPIC
Bacteria, UA: NONE SEEN
Bilirubin Urine: NEGATIVE
Glucose, UA: NEGATIVE mg/dL
Ketones, ur: 80 mg/dL — AB
Nitrite: NEGATIVE
Protein, ur: 100 mg/dL — AB
RBC / HPF: >50 RBC/hpf — ABNORMAL HIGH (ref 0–5)
Specific Gravity, Urine: 1.025 (ref 1.005–1.030)
WBC, UA: >50 WBC/hpf — ABNORMAL HIGH (ref 0–5)
pH: 5 (ref 5.0–8.0)

## 2022-03-25 LAB — BLOOD GAS, VENOUS
Acid-Base Excess: 4 mmol/L — ABNORMAL HIGH (ref 0.0–2.0)
Bicarbonate: 29.2 mmol/L — ABNORMAL HIGH (ref 20.0–28.0)
O2 Saturation: 74.3 %
Patient temperature: 37
pCO2, Ven: 45 mmHg (ref 44–60)
pH, Ven: 7.42 (ref 7.25–7.43)
pO2, Ven: 43 mmHg (ref 32–45)

## 2022-03-25 LAB — CBG MONITORING, ED: Glucose-Capillary: 106 mg/dL — ABNORMAL HIGH (ref 70–99)

## 2022-03-25 LAB — TROPONIN I (HIGH SENSITIVITY): Troponin I (High Sensitivity): 15 ng/L (ref <18)

## 2022-03-25 LAB — BETA-HYDROXYBUTYRIC ACID: Beta-Hydroxybutyric Acid: 1.65 mmol/L — ABNORMAL HIGH (ref 0.05–0.27)

## 2022-03-25 LAB — LIPASE, BLOOD: Lipase: 22 U/L (ref 11–51)

## 2022-03-25 MED ORDER — LACTATED RINGERS IV BOLUS
1000.0000 mL | Freq: Once | INTRAVENOUS | Status: AC
Start: 2022-03-25 — End: 2022-03-25
  Administered 2022-03-25: 1000 mL via INTRAVENOUS

## 2022-03-25 MED ORDER — FAMOTIDINE 20 MG PO TABS: 20.0000 mg | ORAL_TABLET | Freq: Two times a day (BID) | ORAL | 0 refills | Status: DC

## 2022-03-25 MED ORDER — ONDANSETRON HCL 4 MG/2ML IJ SOLN
4.0000 mg | Freq: Once | INTRAMUSCULAR | Status: AC
  Administered 2022-03-25: 4 mg via INTRAVENOUS
  Filled 2022-03-25: qty 2

## 2022-03-25 MED ORDER — ONDANSETRON 4 MG PO TBDP: 4.0000 mg | ORAL_TABLET | Freq: Three times a day (TID) | ORAL | 0 refills | Status: AC | PRN

## 2022-03-25 MED ORDER — LIDOCAINE VISCOUS HCL 2 % MT SOLN
15.0000 mL | Freq: Once | OROMUCOSAL | Status: AC
  Administered 2022-03-25: 15 mL via OROMUCOSAL

## 2022-03-25 MED ORDER — PANTOPRAZOLE SODIUM 40 MG IV SOLR
40.0000 mg | Freq: Once | INTRAVENOUS | Status: AC
  Administered 2022-03-25: 40 mg via INTRAVENOUS
  Filled 2022-03-25: qty 10

## 2022-03-25 MED ORDER — ONDANSETRON 8 MG PO TBDP
ORAL_TABLET | ORAL | Status: AC
  Filled 2022-03-25: qty 1

## 2022-03-25 MED ORDER — ALUM & MAG HYDROXIDE-SIMETH 200-200-20 MG/5ML PO SUSP
15.0000 mL | Freq: Once | ORAL | Status: AC
  Administered 2022-03-25: 15 mL via ORAL

## 2022-03-25 MED ORDER — DIPHENHYDRAMINE HCL 50 MG/ML IJ SOLN
25.0000 mg | Freq: Once | INTRAMUSCULAR | Status: AC
  Administered 2022-03-25: 25 mg via INTRAVENOUS
  Filled 2022-03-25: qty 1

## 2022-03-25 NOTE — ED Triage Notes (Signed)
To triage with c/o " DKA" States Dexcom shows  blood sugar 150. Has not been higher than 150 today. C/o feeling lightheadness, vomiting. Feeling as thought hes going to pass out. Pt seen Friday for back pain and given medication that made him vomit. Medication changed yesterday but vomiting. Pt reports 4 episodes of vomiting today. Last dose of medication was apx 11pm last night. Pt reports increased thirst, urination and confusion.  ?

## 2022-03-25 NOTE — ED Provider Notes (Signed)
? ?[facility] ?Provider Note ? ? ? Event Date/Time  ? First MD Initiated Contact with Patient 03/25/22 [phone]   ?  (approximate) ? ? ?History  ? ?Hyperglycemia ? ? ?HPI ? ?SHAHIL SPEEGLE is a 76 y.o. male with a history of type 1 diabetes with an insulin pump, hyperlipidemia, hypertension who comes ED worried about DKA.  He reports that he has been having lightheadedness, malaise, vomiting for the past 2 to 3 days after starting Flexeril for back pain.  Reports increased thirst.  No diarrhea.  No fever or significant abdominal pain. ?  ? ? ?Physical Exam  ? ?Triage Vital Signs: ?ED Triage Vitals  ?Enc Vitals Group  ?   BP 03/25/22 0539 138/81  ?   Pulse Rate 03/25/22 0539 84  ?   Resp 03/25/22 0539 (!) 22  ?   Temp 03/25/22 0539 98.5 ?F (36.9 ?C)  ?   Temp Source 03/25/22 0539 Oral  ?   SpO2 03/25/22 0539 97 %  ?   Weight 03/25/22 0541 208 lb (94.3 kg)  ?   Height 03/25/22 0541 '5\' 9"'$  (1.753 m)  ?   Head Circumference --   ?   Peak Flow --   ?   Pain Score 03/25/22 0541 10  ?   Pain Loc --   ?   Pain Edu? --   ?   Excl. in [place]? --   ? ? ?Most recent vital signs: ?Vitals:  ? 03/25/22 0743 03/25/22 1056  ?BP: (!) 171/77 (!) 160/70  ?Pulse: 82 80  ?Resp: 18 18  ?Temp:    ?SpO2: 97% 98%  ? ? ? ?General: Awake, no distress.  ?CV:  Good peripheral perfusion.  Regular rate rhythm ?Resp:  Normal effort.  Clear to auscultation bilaterally ?Abd:  No distention.  Soft and nontender ?Other:  No lower extremity edema.  No rash. ? ? ?ED Results / Procedures / Treatments  ? ?Labs ?(all labs ordered are listed, but only abnormal results are displayed) ?Labs Reviewed  ?CBC WITH DIFFERENTIAL/PLATELET - Abnormal; Notable for the following components:  ?    Result Value  ? WBC 14.6 (*)   ? Neutro Abs 11.6 (*)   ? Monocytes Absolute 1.6 (*)   ? Abs Immature Granulocytes 0.09 (*)   ? All other components within normal limits  ?COMPREHENSIVE METABOLIC PANEL - Abnormal; Notable for the following components:  ?  Glucose, Bld 125 (*)   ? BUN 27 (*)   ? Albumin 3.3 (*)   ? All other components within normal limits  ?BLOOD GAS, VENOUS - Abnormal; Notable for the following components:  ? Bicarbonate 29.2 (*)   ? Acid-Base Excess 4.0 (*)   ? All other components within normal limits  ?BETA-HYDROXYBUTYRIC ACID - Abnormal; Notable for the following components:  ? Beta-Hydroxybutyric Acid 1.65 (*)   ? All other components within normal limits  ?URINALYSIS, ROUTINE W REFLEX MICROSCOPIC - Abnormal; Notable for the following components:  ? Color, Urine YELLOW (*)   ? APPearance HAZY (*)   ? Hgb urine dipstick LARGE (*)   ? Ketones, ur 80 (*)   ? Protein, ur 100 (*)   ? Leukocytes,Ua MODERATE (*)   ? RBC / HPF >50 (*)   ? WBC, UA >50 (*)   ? All other components within normal limits  ?CBG MONITORING, ED - Abnormal; Notable for the following components:  ? Glucose-Capillary 106 (*)   ? All other components  within normal limits  ?LIPASE, BLOOD  ?CBG MONITORING, ED  ?TROPONIN I (HIGH SENSITIVITY)  ? ? ? ?EKG ? ?Interpreted by me ?Normal sinus rhythm rate of 82.  Normal axis and intervals.  Poor R wave progression.  Normal ST segments and T waves.  No ischemic changes. ? ? ?RADIOLOGY ? ? ? ? ?PROCEDURES: ? ?Critical Care performed: No ? ?Procedures ? ? ?MEDICATIONS ORDERED IN ED: ?Medications  ?ondansetron (ZOFRAN-ODT) 8 MG disintegrating tablet (  Not Given 03/25/22 0858)  ?lactated ringers bolus 1,000 mL (0 mLs Intravenous Stopped 03/25/22 1055)  ?pantoprazole (PROTONIX) injection 40 mg (40 mg Intravenous Given 03/25/22 0840)  ?ondansetron [facility]) injection 4 mg (4 mg Intravenous Given 03/25/22 0841)  ?diphenhydrAMINE (BENADRYL) injection 25 mg (25 mg Intravenous Given 03/25/22 0841)  ?lidocaine (XYLOCAINE) 2 % viscous mouth solution 15 mL (15 mLs Mouth/Throat Given by Other 03/25/22 1012)  ?alum & mag hydroxide-simeth (MAALOX/MYLANTA) 200-200-20 MG/5ML suspension 15 mL (15 mLs Oral Given by Other 03/25/22 1012)  ? ? ? ?IMPRESSION / MDM /  ASSESSMENT AND PLAN / ED COURSE  ?I reviewed the triage vital signs and the nursing notes. ?             ?               ? ?Differential diagnosis includes, but is not limited to, dehydration, electrolyte abnormality, anemia, AKI, DKA, viral illness, pancreatitis ? ?Patient presents with vomiting, feeling dehydrated.  He has an insulin pump which is kept his blood sugar regulated at about 150.  Patient was given IV fluids for hydration, IV Zofran and Protonix for symptom relief.  Labs are all reassuring, no signs of metabolic acidosis.  Lipase is normal.  Vital signs are unremarkable and he is feeling 100% better after IV fluids.  Patient agrees with outpatient management at this point.  His urinalysis shows red cells white cells but no bacteria.  He does not have dysuria.  Urine cultures ordered.  Abdomen is soft and benign, not requiring CT imaging. ? ? ?Clinical Course as of 03/25/22 1111  ?Mon Mar 25, 2022  ?8088 PIV inserted by me due to 4 failed attempts by nursing. [PS]  ?  ?Clinical Course User Index ?[PS] Carrie Mew, MD  ? ? ? ?FINAL CLINICAL IMPRESSION(S) / ED DIAGNOSES  ? ?Final diagnoses:  ?Dehydration  ?Type 1 diabetes mellitus with other specified complication ([place])  ? ? ? ?Rx / DC Orders  ? ?ED Discharge Orders   ? ?      Ordered  ?  ondansetron (ZOFRAN-ODT) 4 MG disintegrating tablet  Every 8 hours PRN       ? 03/25/22 1110  ?  famotidine (PEPCID) 20 MG tablet  2 times daily       ? 03/25/22 1110  ? ?  ?  ? ?  ? ? ? ?Note:  This document was prepared using Dragon voice recognition software and may include unintentional dictation errors. ?  ?Carrie Mew, MD ?03/25/22 1121 ? ?

## 2022-03-25 NOTE — Progress Notes (Signed)
MD at bedside, inserted the PIV. ?

## 2022-03-27 LAB — URINE CULTURE: Culture: 30000 — AB

## 2022-03-28 DIAGNOSIS — Z8551 Personal history of malignant neoplasm of bladder: Secondary | ICD-10-CM | POA: Diagnosis not present

## 2022-03-28 DIAGNOSIS — N390 Urinary tract infection, site not specified: Secondary | ICD-10-CM | POA: Diagnosis not present

## 2022-03-28 DIAGNOSIS — Z22322 Carrier or suspected carrier of Methicillin resistant Staphylococcus aureus: Secondary | ICD-10-CM | POA: Diagnosis not present

## 2022-03-28 DIAGNOSIS — E109 Type 1 diabetes mellitus without complications: Secondary | ICD-10-CM | POA: Diagnosis not present

## 2022-03-28 DIAGNOSIS — M545 Low back pain, unspecified: Secondary | ICD-10-CM | POA: Diagnosis not present

## 2022-04-02 DIAGNOSIS — K5900 Constipation, unspecified: Secondary | ICD-10-CM | POA: Diagnosis not present

## 2022-04-02 DIAGNOSIS — I878 Other specified disorders of veins: Secondary | ICD-10-CM | POA: Diagnosis not present

## 2022-04-16 DIAGNOSIS — E103311 Type 1 diabetes mellitus with moderate nonproliferative diabetic retinopathy with macular edema, right eye: Secondary | ICD-10-CM | POA: Diagnosis not present

## 2022-04-26 DIAGNOSIS — N3020 Other chronic cystitis without hematuria: Secondary | ICD-10-CM | POA: Diagnosis not present

## 2022-04-26 DIAGNOSIS — N390 Urinary tract infection, site not specified: Secondary | ICD-10-CM | POA: Diagnosis not present

## 2022-04-26 DIAGNOSIS — N35010 Post-traumatic urethral stricture, male, meatal: Secondary | ICD-10-CM | POA: Diagnosis not present

## 2022-05-09 DIAGNOSIS — N35011 Post-traumatic bulbous urethral stricture: Secondary | ICD-10-CM | POA: Diagnosis not present

## 2022-05-09 DIAGNOSIS — N390 Urinary tract infection, site not specified: Secondary | ICD-10-CM | POA: Diagnosis not present

## 2022-05-09 DIAGNOSIS — N3281 Overactive bladder: Secondary | ICD-10-CM | POA: Diagnosis not present

## 2022-05-23 DIAGNOSIS — I10 Essential (primary) hypertension: Secondary | ICD-10-CM | POA: Diagnosis not present

## 2022-05-23 DIAGNOSIS — E103212 Type 1 diabetes mellitus with mild nonproliferative diabetic retinopathy with macular edema, left eye: Secondary | ICD-10-CM | POA: Diagnosis not present

## 2022-05-23 DIAGNOSIS — E1042 Type 1 diabetes mellitus with diabetic polyneuropathy: Secondary | ICD-10-CM | POA: Diagnosis not present

## 2022-05-23 DIAGNOSIS — R809 Proteinuria, unspecified: Secondary | ICD-10-CM | POA: Diagnosis not present

## 2022-05-23 DIAGNOSIS — E1029 Type 1 diabetes mellitus with other diabetic kidney complication: Secondary | ICD-10-CM | POA: Diagnosis not present

## 2022-05-23 DIAGNOSIS — Z9641 Presence of insulin pump (external) (internal): Secondary | ICD-10-CM | POA: Diagnosis not present

## 2022-05-23 DIAGNOSIS — E1059 Type 1 diabetes mellitus with other circulatory complications: Secondary | ICD-10-CM | POA: Diagnosis not present

## 2022-05-27 DIAGNOSIS — E109 Type 1 diabetes mellitus without complications: Secondary | ICD-10-CM | POA: Diagnosis not present

## 2022-05-27 DIAGNOSIS — E1065 Type 1 diabetes mellitus with hyperglycemia: Secondary | ICD-10-CM | POA: Diagnosis not present

## 2022-05-28 DIAGNOSIS — E103312 Type 1 diabetes mellitus with moderate nonproliferative diabetic retinopathy with macular edema, left eye: Secondary | ICD-10-CM | POA: Diagnosis not present

## 2022-05-30 DIAGNOSIS — R300 Dysuria: Secondary | ICD-10-CM | POA: Diagnosis not present

## 2022-05-30 DIAGNOSIS — L03012 Cellulitis of left finger: Secondary | ICD-10-CM | POA: Diagnosis not present

## 2022-06-04 DIAGNOSIS — R8290 Unspecified abnormal findings in urine: Secondary | ICD-10-CM | POA: Diagnosis not present

## 2022-06-12 DIAGNOSIS — S66812A Strain of other specified muscles, fascia and tendons at wrist and hand level, left hand, initial encounter: Secondary | ICD-10-CM | POA: Diagnosis not present

## 2022-06-12 DIAGNOSIS — M79645 Pain in left finger(s): Secondary | ICD-10-CM | POA: Diagnosis not present

## 2022-06-12 DIAGNOSIS — R29898 Other symptoms and signs involving the musculoskeletal system: Secondary | ICD-10-CM | POA: Diagnosis not present

## 2022-06-12 DIAGNOSIS — M25442 Effusion, left hand: Secondary | ICD-10-CM | POA: Diagnosis not present

## 2022-06-20 DIAGNOSIS — R600 Localized edema: Secondary | ICD-10-CM | POA: Diagnosis not present

## 2022-06-20 DIAGNOSIS — I2510 Atherosclerotic heart disease of native coronary artery without angina pectoris: Secondary | ICD-10-CM | POA: Diagnosis not present

## 2022-06-20 DIAGNOSIS — E109 Type 1 diabetes mellitus without complications: Secondary | ICD-10-CM | POA: Diagnosis not present

## 2022-06-20 DIAGNOSIS — I10 Essential (primary) hypertension: Secondary | ICD-10-CM | POA: Diagnosis not present

## 2022-06-20 DIAGNOSIS — Z0000 Encounter for general adult medical examination without abnormal findings: Secondary | ICD-10-CM | POA: Diagnosis not present

## 2022-07-01 DIAGNOSIS — M79645 Pain in left finger(s): Secondary | ICD-10-CM | POA: Diagnosis not present

## 2022-07-01 DIAGNOSIS — M79642 Pain in left hand: Secondary | ICD-10-CM | POA: Diagnosis not present

## 2022-07-02 DIAGNOSIS — E109 Type 1 diabetes mellitus without complications: Secondary | ICD-10-CM | POA: Diagnosis not present

## 2022-07-02 DIAGNOSIS — M778 Other enthesopathies, not elsewhere classified: Secondary | ICD-10-CM | POA: Diagnosis not present

## 2022-07-02 DIAGNOSIS — L97521 Non-pressure chronic ulcer of other part of left foot limited to breakdown of skin: Secondary | ICD-10-CM | POA: Diagnosis not present

## 2022-07-02 DIAGNOSIS — M79642 Pain in left hand: Secondary | ICD-10-CM | POA: Diagnosis not present

## 2022-07-03 DIAGNOSIS — M66342 Spontaneous rupture of flexor tendons, left hand: Secondary | ICD-10-CM | POA: Diagnosis not present

## 2022-07-05 DIAGNOSIS — R300 Dysuria: Secondary | ICD-10-CM | POA: Diagnosis not present

## 2022-07-05 DIAGNOSIS — N35919 Unspecified urethral stricture, male, unspecified site: Secondary | ICD-10-CM | POA: Diagnosis not present

## 2022-07-10 DIAGNOSIS — M25642 Stiffness of left hand, not elsewhere classified: Secondary | ICD-10-CM | POA: Diagnosis not present

## 2022-07-12 ENCOUNTER — Other Ambulatory Visit
Admission: RE | Admit: 2022-07-12 | Discharge: 2022-07-12 | Disposition: A | Discharge status: Home / Self Care | Payer: Medicare HMO | Attending: Orthopedic Surgery | Admitting: Orthopedic Surgery

## 2022-07-12 DIAGNOSIS — M66342 Spontaneous rupture of flexor tendons, left hand: Secondary | ICD-10-CM | POA: Insufficient documentation

## 2022-07-12 LAB — CBC WITH DIFFERENTIAL/PLATELET
Abs Immature Granulocytes: 0.05 10*3/uL (ref 0.00–0.07)
Basophils Absolute: 0.1 10*3/uL (ref 0.0–0.1)
Basophils Relative: 1 %
Eosinophils Absolute: 0.4 10*3/uL (ref 0.0–0.5)
Eosinophils Relative: 4 %
HCT: 38.2 % — ABNORMAL LOW (ref 39.0–52.0)
Hemoglobin: 12.2 g/dL — ABNORMAL LOW (ref 13.0–17.0)
Immature Granulocytes: 1 %
Lymphocytes Relative: 22 %
Lymphs Abs: 2.1 10*3/uL (ref 0.7–4.0)
MCH: 29.7 pg (ref 26.0–34.0)
MCHC: 31.9 g/dL (ref 30.0–36.0)
MCV: 92.9 fL (ref 80.0–100.0)
Monocytes Absolute: 1 10*3/uL (ref 0.1–1.0)
Monocytes Relative: 10 %
Neutro Abs: 6 10*3/uL (ref 1.7–7.7)
Neutrophils Relative %: 62 %
Platelets: 332 10*3/uL (ref 150–400)
RBC: 4.11 MIL/uL — ABNORMAL LOW (ref 4.22–5.81)
RDW: 14.8 % (ref 11.5–15.5)
WBC: 9.6 10*3/uL (ref 4.0–10.5)
nRBC: 0 % (ref 0.0–0.2)

## 2022-07-12 LAB — SEDIMENTATION RATE: Sed Rate: 29 mm/hr — ABNORMAL HIGH (ref 0–20)

## 2022-07-12 LAB — C-REACTIVE PROTEIN: CRP: 0.8 mg/dL (ref <1.0)

## 2022-07-17 DIAGNOSIS — M66342 Spontaneous rupture of flexor tendons, left hand: Secondary | ICD-10-CM | POA: Diagnosis not present

## 2022-07-18 ENCOUNTER — Telehealth: Payer: Self-pay | Admitting: Cardiovascular Disease

## 2022-07-18 NOTE — Telephone Encounter (Signed)
   Name: [name]  DOB: 05-03-1946  MRN: [id number]  Primary Cardiologist: Ida Rogue, MD  Chart reviewed as part of pre-operative protocol coverage. Because of [name]'s past medical history and time since last visit, he will require a follow-up telephone visit in order to better assess preoperative cardiovascular risk.  Pre-op covering staff: - Please schedule appointment and call patient to inform them. If patient already had an upcoming appointment within acceptable timeframe, please add "pre-op clearance" to the appointment notes so provider is aware. - Please contact requesting surgeon's office via preferred method (i.e, phone, fax) to inform them of need for appointment prior to surgery.  This message will also be routed to  Dr Rockey Situ for input on holding ASA as requested below so that this information is available to the clearing provider at time of patient's appointment.   [name] Collard, [name]  07/18/2022, 4:19 PM

## 2022-07-18 NOTE — Telephone Encounter (Signed)
   Pre-operative Risk Assessment    Patient Name: [name]  DOB: 1946-08-06 MRN: [id number]{     Request for Surgical Clearance    Procedure:   LT MIDDLE FINGER FLEX OR TENDON RECON  Date of Surgery:  Clearance 08/06/22                               Surgeon:  DR Orene Desanctis Surgeon's Group or Practice Name:  [facility] Phone number:  [phone] Fax number:  [phone]  Type of Clearance Requested:  PHARMACY: ASA  Type of Anesthesia:   CHOICE  Additional requests/questions:    Patsi Sears   07/18/2022, 3:40 PM

## 2022-07-19 ENCOUNTER — Telehealth: Payer: Self-pay | Admitting: *Deleted

## 2022-07-19 NOTE — Telephone Encounter (Signed)
Please see notes below; pt states that he did not have to take Coreg if he did not want to.   Last ov with Dr. Rockey Situ 11/26/21:   We held his carvedilol and Lipitor. Night sweats did not improve. told he had infection in his gums and has had significant work done.  night sweats have improved    Coreg 6.25 mg BID remains on med list and pt reports not taking and has not had any for a while.   Will clarify with Dr. Rockey Situ if pt is to continue taking Coreg.

## 2022-07-19 NOTE — Telephone Encounter (Signed)
I s/w the pt and he is agreeable to plan of care for tele pre op appt 07/29/22 @ 9:40. Med rec and consent are done.  Pt tells me that he has not been taking Coreg for awhile as he ran out. States Dr. Rockey Situ stated to him that he did not have to take it if he did not want to. Pt tells me today that his Urologist has informed him that it would be good for him to take as it it helps the kidneys. Pt asked for a refill.   I assured the pt that I will send a message to Dr. Donivan [name] nurse to d/w MD in regard to a refill. Pt thanked me for the help today.     Patient Consent for Virtual Visit        [name] has provided verbal consent on 07/19/2022 for a virtual visit (video or telephone).   CONSENT FOR VIRTUAL VISIT FOR:  [name]  By participating in this virtual visit I agree to the following:  I hereby voluntarily request, consent and authorize [place] and its employed or contracted physicians, physician assistants, nurse practitioners or other licensed health care professionals (the Practitioner), to provide me with telemedicine health care services (the "Services") as deemed necessary by the treating Practitioner. I acknowledge and consent to receive the Services by the Practitioner via telemedicine. I understand that the telemedicine visit will involve communicating with the Practitioner through live audiovisual communication technology and the disclosure of certain medical information by electronic transmission. I acknowledge that I have been given the opportunity to request an in-person assessment or other available alternative prior to the telemedicine visit and am voluntarily participating in the telemedicine visit.  I understand that I have the right to withhold or withdraw my consent to the use of telemedicine in the course of my care at any time, without affecting my right to future care or treatment, and that the Practitioner or I may terminate the  telemedicine visit at any time. I understand that I have the right to inspect all information obtained and/or recorded in the course of the telemedicine visit and may receive copies of available information for a reasonable fee.  I understand that some of the potential risks of receiving the Services via telemedicine include:  Delay or interruption in medical evaluation due to technological equipment failure or disruption; Information transmitted may not be sufficient (e.g. poor resolution of images) to allow for appropriate medical decision making by the Practitioner; and/or  In rare instances, security protocols could fail, causing a breach of personal health information.  Furthermore, I acknowledge that it is my responsibility to provide information about my medical history, conditions and care that is complete and accurate to the best of my ability. I acknowledge that Practitioner's advice, recommendations, and/or decision may be based on factors not within their control, such as incomplete or inaccurate data provided by me or distortions of diagnostic images or specimens that may result from electronic transmissions. I understand that the practice of medicine is not an exact science and that Practitioner makes no warranties or guarantees regarding treatment outcomes. I acknowledge that a copy of this consent can be made available to me via my patient portal ([facility]), or I can request a printed copy by calling the office of [place].    I understand that my insurance will be billed for this visit.   I have read or had this consent read to  me. I understand the contents of this consent, which adequately explains the benefits and risks of the Services being provided via telemedicine.  I have been provided ample opportunity to ask questions regarding this consent and the Services and have had my questions answered to my satisfaction. I give my informed consent for the services  to be provided through the use of telemedicine in my medical care

## 2022-07-19 NOTE — Telephone Encounter (Signed)
I s/w the pt and he is agreeable to plan of care for tele pre op appt 07/29/22 @ 9:40. Med rec and consent are done.   Pt tells me that he has not been taking Coreg for awhile as he ran out. States Dr. Rockey Situ stated to him that he did not have to take it if he did not want to. Pt tells me today that his Urologist has informed him that it would be good for him to take as it it helps the kidneys. Pt asked for a refill.    I assured the pt that I will send a message to Dr. Donivan Scull nurse to d/w MD in regard to a refill. Pt thanked me for the help today.

## 2022-07-19 NOTE — Telephone Encounter (Signed)
-----   Message from Michae Kava, [place] sent at 07/19/2022  8:41 AM EDT ----- Regarding: REFILL FOR COREG, SEE NOTES BELOW Good morning everyone,   I was speaking with pt about pre op stuff when he asked me about Coreg refill, see my notes below.    Pt tells me that he has not been taking Coreg for awhile as he ran out. States Dr. Rockey Situ stated to him that he did not have to take it if he did not want to. Pt tells me today that his Urologist has informed him that it would be good for him to take as it it helps the kidneys. Pt asked for a refill.    Thank you  Arbie Cookey

## 2022-07-22 MED ORDER — CARVEDILOL 6.25 MG PO TABS: ORAL_TABLET | ORAL | 1 refills | Status: DC

## 2022-07-22 NOTE — Telephone Encounter (Signed)
I called and spoke with the patient. I have advised him of Dr. Donivan Scull recommendations to continue coreg. The patient is agreeable.  I have advised him I will refill this at his previous dose of coreg 3.25 mg BID. The patient voices understanding and is agreeable. He was appreciative of the call back.

## 2022-07-22 NOTE — Telephone Encounter (Signed)
[name], MD  Sent: Sun July 21, 2022  2:17 PM  To: [facility] Triage          Message  Would continue carvedilol  Thx  TGollan

## 2022-07-22 NOTE — Addendum Note (Signed)
Addended by: Alvis Lemmings C on: 07/22/2022 11:42 AM   Modules accepted: Orders

## 2022-07-23 DIAGNOSIS — E083211 Diabetes mellitus due to underlying condition with mild nonproliferative diabetic retinopathy with macular edema, right eye: Secondary | ICD-10-CM | POA: Diagnosis not present

## 2022-07-23 DIAGNOSIS — E103311 Type 1 diabetes mellitus with moderate nonproliferative diabetic retinopathy with macular edema, right eye: Secondary | ICD-10-CM | POA: Diagnosis not present

## 2022-07-23 DIAGNOSIS — E103312 Type 1 diabetes mellitus with moderate nonproliferative diabetic retinopathy with macular edema, left eye: Secondary | ICD-10-CM | POA: Diagnosis not present

## 2022-07-23 DIAGNOSIS — E103313 Type 1 diabetes mellitus with moderate nonproliferative diabetic retinopathy with macular edema, bilateral: Secondary | ICD-10-CM | POA: Diagnosis not present

## 2022-07-29 ENCOUNTER — Ambulatory Visit: Payer: Medicare HMO

## 2022-07-30 ENCOUNTER — Ambulatory Visit: Payer: Medicare HMO | Attending: Interventional Cardiology | Admitting: Physician Assistant

## 2022-07-30 ENCOUNTER — Encounter (HOSPITAL_BASED_OUTPATIENT_CLINIC_OR_DEPARTMENT_OTHER): Payer: Self-pay | Admitting: Orthopedic Surgery

## 2022-07-30 DIAGNOSIS — Z01810 Encounter for preprocedural cardiovascular examination: Secondary | ICD-10-CM | POA: Diagnosis not present

## 2022-07-30 NOTE — Progress Notes (Signed)
Virtual Visit via Telephone Note   Because of [name]'s co-morbid illnesses, he is at least at moderate risk for complications without adequate follow up.  This format is felt to be most appropriate for this patient at this time.  The patient did not have access to video technology/had technical difficulties with video requiring transitioning to audio format only (telephone).  All issues noted in this document were discussed and addressed.  No physical exam could be performed with this format.  Please refer to the patient's chart for his consent to telehealth for [facility]  Evaluation Performed:  Preoperative cardiovascular risk assessment _____________   Date:  07/30/2022   Patient ID:  [name], DOB [birth year], MRN [id number] Patient Location:  Home Provider location:   Office  Primary Care Provider:  [name], MD Primary Cardiologist:  Ida Rogue, MD  Chief Complaint / Patient Profile   76 y.o. y/o male with a h/o coronary artery disease (occluded LAD with stent placed February 2008), diabetes melitis type II, hypertension, former smoker, bladder cancer (08/2017 finished chemo), lymphedema of the lower extremities who is pending left middle finger flex or tendon reconstruction and presents today for telephonic preoperative cardiovascular risk assessment.  Past Medical History    Past Medical History:  Diagnosis Date   Anemia    Arthritis    right knee   Cancer [facility])    BLADDER CANCER (2018)skin cancer   Cataract 04/2020   Coronary artery disease    COVID-19 09/2021   Diabetes mellitus    Edema    GERD (gastroesophageal reflux disease)    rare-NO MEDS   Heart attack ([place]) 2007   History of kidney stones    Hyperlipidemia    Hypertension    Hypoglycemia    history of episodes x2   Ischemic cardiomyopathy    Myocardial infarction [facility])    Peripheral vascular disease ([place])    swelling in Lt ankle due to prior injury    Past Surgical History:  Procedure Laterality Date   CARDIAC CATHETERIZATION     CATARACT EXTRACTION W/PHACO Left 04/18/2020   Procedure: CATARACT EXTRACTION PHACO AND INTRAOCULAR LENS PLACEMENT (IOC) LEFT DIABETIC 14.60  01:57.1;  Surgeon: Birder Robson, MD;  Location: [place];  Service: Ophthalmology;  Laterality: Left;  Diabetic - insulin pump   CATARACT EXTRACTION W/PHACO Right 10/10/2020   Procedure: CATARACT EXTRACTION PHACO AND INTRAOCULAR LENS PLACEMENT (IOC) RIGHT DIABETIC 12.59 01:44.7;  Surgeon: Birder Robson, MD;  Location: [facility];  Service: Ophthalmology;  Laterality: Right;  Diabetic - insulin pump   COLONOSCOPY WITH PROPOFOL N/A 02/18/2018   Procedure: COLONOSCOPY WITH PROPOFOL;  Surgeon: Toledo, Benay Pike, MD;  Location: [facility];  Service: Gastroenterology;  Laterality: N/A;   CORONARY ANGIOPLASTY     CORONARY STENT PLACEMENT     To an occluded LAD x2   CYSTOSCOPY WITH BIOPSY N/A 10/21/2017   Procedure: CYSTOSCOPY WITH BLADDER BIOPSY;  Surgeon: Royston Cowper, MD;  Location: [facility];  Service: Urology;  Laterality: N/A;   CYSTOSCOPY WITH BIOPSY N/A 12/08/2018   Procedure: CYSTOSCOPY WITH BLADDER BIOPSY-MITOMYCIN;  Surgeon: Royston Cowper, MD;  Location: [facility];  Service: Urology;  Laterality: N/A;   CYSTOSCOPY WITH DIRECT VISION INTERNAL URETHROTOMY  03/02/2020   Procedure: CYSTOSCOPY WITH DIRECT VISION INTERNAL URETHROTOMY;  Surgeon: Royston Cowper, MD;  Location: [facility];  Service: Urology;;   CYSTOSCOPY WITH DIRECT VISION INTERNAL URETHROTOMY N/A 09/14/2020   Procedure: CYSTOSCOPY WITH DIRECT VISION  INTERNAL URETHROTOMY WITH HOLM LASER;  Surgeon: Royston Cowper, MD;  Location: [facility];  Service: Urology;  Laterality: N/A;   CYSTOSCOPY WITH DIRECT VISION INTERNAL URETHROTOMY N/A 11/29/2021   Procedure: CYSTOSCOPY WITH DIRECT VISION INTERNAL URETHROTOMY OPTILUME;  Surgeon: Royston Cowper, MD;  Location: [facility];  Service: Urology;   Laterality: N/A;   CYSTOSCOPY WITH HOLMIUM LASER LITHOTRIPSY N/A 05/08/2020   Procedure: CYSTOSCOPY WITH HOLMIUM LASER LITHOTRIPSY;  Surgeon: Royston Cowper, MD;  Location: [facility];  Service: Urology;  Laterality: N/A;   EXCISION MASS LOWER EXTREMETIES Right 06/05/2016   Procedure: EXCISION OF LESION RIGHT LOWER LEG;  Surgeon: Hubbard Robinson, MD;  Location: [facility];  Service: General;  Laterality: Right;   GREEN LIGHT LASER TURP (TRANSURETHRAL RESECTION OF PROSTATE N/A 08/12/2019   Procedure: GREEN LIGHT LASER TURP (TRANSURETHRAL RESECTION OF PROSTATE, BLADDER BIOPSY;  Surgeon: Royston Cowper, MD;  Location: [facility];  Service: Urology;  Laterality: N/A;   HOLMIUM LASER APPLICATION  4/00/8676   Procedure: HOLMIUM LASER APPLICATION;  Surgeon: Royston Cowper, MD;  Location: [facility];  Service: Urology;;  Urethral stone    SKIN CANCER EXCISION     chest   TRANSURETHRAL RESECTION OF BLADDER TUMOR N/A 07/15/2017   Procedure: TRANSURETHRAL RESECTION OF BLADDER TUMOR (TURBT);  Surgeon: Royston Cowper, MD;  Location: [facility];  Service: Urology;  Laterality: N/A;   URETERAL BIOPSY N/A 08/12/2019   Procedure: URETERAL BIOPSY;  Surgeon: Royston Cowper, MD;  Location: [facility];  Service: Urology;  Laterality: N/A;   URETHROTOMY N/A 05/08/2020   Procedure: CYSTOSCOPY/URETHROTOMY;  Surgeon: Royston Cowper, MD;  Location: [facility];  Service: Urology;  Laterality: N/A;    Allergies  No Known Allergies  History of Present Illness    [name] is a 76 y.o. male who presents via audio/video conferencing for a telehealth visit today.  Pt was last seen in cardiology clinic on 11/26/2021 by Dr. Rockey Situ.  At that time CAMRAN KEADY was doing well .  The patient is now pending procedure as outlined above. Since his last visit, he has had no cardiovascular issues.  He denies chest pain, shortness of breath, palpitations.  He stays very active around his house and does his own yard work.   For this reason he scored a 5.62 METS on the DASI.  This exceeds the minimum 4 METS requirement.  Dr. Donivan Scull recommendations below.  Given history of LAD stent, would prefer he not stop his aspirin for finger surgery  No further cardiac testing  Would recommend he stay on the carvedilol  -Dr. Rockey Situ  Home Medications    Prior to Admission medications   Medication Sig Start Date End Date Taking? Authorizing Provider  aspirin EC 81 MG tablet Take 81 mg by mouth daily.    [provider]  atorvastatin (LIPITOR) 80 MG tablet TAKE 1 TABLET BY MOUTH DAILY 10/09/21   [name], MD  carvedilol (COREG) 6.25 MG tablet TAKE 1 TABLET BY MOUTH TWICE DAILY WITH MEAL 07/22/22   Gollan, Kathlene November, MD  docusate sodium (COLACE) 100 MG capsule Take 2 capsules (200 mg total) by mouth 2 (two) times daily. Patient taking differently: Take 100 mg by mouth daily as needed for moderate constipation. 08/12/19   Royston Cowper, MD  famotidine (PEPCID) 20 MG tablet Take 1 tablet (20 mg total) by mouth 2 (two) times daily. 03/25/22   Carrie Mew, MD  glucagon 1 MG injection Inject  1 mg into the skin once as needed (for low blood sugar).     [provider]  Hyoscyamine Sulfate SL (LEVSIN/SL) 0.125 MG SUBL Place 0.125-0.25 mg under the tongue every 4 (four) hours as needed (bladder spasms). 1-2 TABS 11/29/21   Royston Cowper, MD  Insulin Human (INSULIN PUMP) SOLN Inject into the skin.    [provider]  losartan (COZAAR) 50 MG tablet Take 0.5 tablets (25 mg total) by mouth every other day. 11/15/20   [name], MD  Meth-Hyo-M [place] Hatter Phos-Ph Sal (URIBEL) 118 MG CAPS Take 1 capsule (118 mg total) by mouth every 6 (six) hours as needed (dysuria). 08/12/19   Royston Cowper, MD  Multiple Vitamin (MULTIVITAMIN WITH MINERALS) TABS tablet Take 1 tablet by mouth daily.    [provider]  naproxen sodium (ALEVE) 220 MG tablet Take 220-440 mg by mouth 2 (two) times daily as  needed (pain/headaches.).    [provider]  NOVOLOG 100 UNIT/ML injection Inject 150 Units into the skin daily. Uses via inuslin pump 03/14/16   [provider]  ondansetron (ZOFRAN-ODT) 4 MG disintegrating tablet Take 1 tablet (4 mg total) by mouth every 8 (eight) hours as needed for nausea or vomiting. 03/25/22   Carrie Mew, MD  vitamin B-12 (CYANOCOBALAMIN) 1000 MCG tablet Take 1,000 mcg by mouth every other day.    [provider]    Physical Exam    Vital Signs:  NILAY MANGRUM does not have vital signs available for review today.  Given telephonic nature of communication, physical exam is limited. AAOx3. NAD. Normal affect.  Speech and respirations are unlabored.  Accessory Clinical Findings    None  Assessment & Plan    1.  Preoperative Cardiovascular Risk Assessment:  Mr. Shives perioperative risk of a major cardiac event is 6.6% according to the Revised Cardiac Risk Index (RCRI).  Therefore, he is at high risk for perioperative complications.   His functional capacity is good at 5.62 METs according to the [place] Activity Status Index (DASI). Recommendations: According to ACC/AHA guidelines, no further cardiovascular testing needed.  The patient may proceed to surgery at acceptable risk.   Antiplatelet and/or Anticoagulation Recommendations: Would prefer to continue aspirin perioperatively.   A copy of this note will be routed to requesting surgeon.  Time:   Today, I have spent 15 minutes with the patient with telehealth technology discussing medical history, symptoms, and management plan.     Elgie Collard, [name]  07/30/2022, 9:27 AM

## 2022-07-30 NOTE — Progress Notes (Addendum)
Addendum:  Reviewed chart w/ anesthesia , Dr Royce Macadamia MDA, via phone.  Dr Royce Macadamia stated ok to proceed.   Spoke w/ via phone for pre-op interview--- pt Lab needs dos---- [employer] results------ current ekg in epic/ chart COVID test -----patient states asymptomatic no test needed Arrive at ------- 0530 on 08-06-2022 NPO after MN NO Solid Food.  Clear liquids from MN until--- 0430 Med rec completed Medications to take morning of surgery ----- lipitor, coreg, pepcid Diabetic medication ----- pt has insulin pump, will leave on dos (located abdomin) and has dexcom (located on abdomin) Patient instructed no nail polish to be worn day of surgery Patient instructed to bring photo id and insurance card day of surgery Patient aware to have Driver (ride ) / caregiver  for 24 hours after surgery --friend, patrick Patient Special Instructions ----- n/a  Pre-Op special Istructions ----- pt has telephone office visit for cardiac clearance 07-30-2022 by Nicholes Rough PA in epic/ chart.  Per cardiology recommendation pt to continue asa.  Pt stated aware to stop asa and will not take morning of surgery  Patient verbalized understanding of instructions that were given at this phone interview. Patient denies shortness of breath, chest pain, fever, cough at this phone interview.    Anesthesia Review: HTN;  CAD hx MI 02/ 2008 s/p PCI w/ DES x2 to LAD;  Ischemic cardiomyopathy 2008 per cath ef 30% (per Dr Lacie Scotts note last echo 09/ 2008 ef 50-55%);  IDDM1 on insulin pump  PCP: Dr Irish Lack Cardiologist : Dr Rockey Situ [facility] 11-15-2021 epic) Endocrinologist:  Dr A. Solum (lov 05-23-2022 epic)  EKG :  03-25-2022 epic Echo :  scanned in epic under media dated 07-22-2007 Stress test: nuclear 07-22-2008 scanned in epic under media , showed no ischemia previous infarct scarring, ef 45% Cardiac Cath :   12-17-2006 epic Activity level:  denies sob w/ any activity Fasting Blood Sugar :  120s    / Checks  Blood Sugar -- times a day:  multiple times  Blood Thinner/ Instructions /Last Dose: no ASA / Instructions/ Last Dose :  ASA '81mg'$ 
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
## 2022-08-06 ENCOUNTER — Other Ambulatory Visit: Payer: Self-pay

## 2022-08-06 ENCOUNTER — Encounter (HOSPITAL_BASED_OUTPATIENT_CLINIC_OR_DEPARTMENT_OTHER)
Admission: RE | Disposition: A | Discharge status: Home / Self Care | Payer: Self-pay | Source: Home / Self Care | Attending: Orthopedic Surgery

## 2022-08-06 ENCOUNTER — Ambulatory Visit (HOSPITAL_BASED_OUTPATIENT_CLINIC_OR_DEPARTMENT_OTHER)
Admission: RE | Admit: 2022-08-06 | Discharge: 2022-08-06 | Disposition: A | Discharge status: Home / Self Care | Payer: Medicare HMO | Attending: Orthopedic Surgery | Admitting: Orthopedic Surgery

## 2022-08-06 ENCOUNTER — Encounter (HOSPITAL_BASED_OUTPATIENT_CLINIC_OR_DEPARTMENT_OTHER): Payer: Self-pay | Admitting: Orthopedic Surgery

## 2022-08-06 ENCOUNTER — Ambulatory Visit (HOSPITAL_BASED_OUTPATIENT_CLINIC_OR_DEPARTMENT_OTHER): Payer: Medicare HMO | Admitting: Anesthesiology

## 2022-08-06 DIAGNOSIS — Z794 Long term (current) use of insulin: Secondary | ICD-10-CM | POA: Diagnosis not present

## 2022-08-06 DIAGNOSIS — E1051 Type 1 diabetes mellitus with diabetic peripheral angiopathy without gangrene: Secondary | ICD-10-CM | POA: Diagnosis not present

## 2022-08-06 DIAGNOSIS — M66342 Spontaneous rupture of flexor tendons, left hand: Secondary | ICD-10-CM | POA: Diagnosis not present

## 2022-08-06 DIAGNOSIS — I509 Heart failure, unspecified: Secondary | ICD-10-CM | POA: Insufficient documentation

## 2022-08-06 DIAGNOSIS — S66103A Unspecified injury of flexor muscle, fascia and tendon of left middle finger at wrist and hand level, initial encounter: Secondary | ICD-10-CM | POA: Diagnosis not present

## 2022-08-06 DIAGNOSIS — Z86718 Personal history of other venous thrombosis and embolism: Secondary | ICD-10-CM | POA: Diagnosis not present

## 2022-08-06 DIAGNOSIS — K219 Gastro-esophageal reflux disease without esophagitis: Secondary | ICD-10-CM | POA: Diagnosis not present

## 2022-08-06 DIAGNOSIS — S66113A Strain of flexor muscle, fascia and tendon of left middle finger at wrist and hand level, initial encounter: Secondary | ICD-10-CM | POA: Insufficient documentation

## 2022-08-06 DIAGNOSIS — I2510 Atherosclerotic heart disease of native coronary artery without angina pectoris: Secondary | ICD-10-CM

## 2022-08-06 DIAGNOSIS — Z955 Presence of coronary angioplasty implant and graft: Secondary | ICD-10-CM | POA: Diagnosis not present

## 2022-08-06 DIAGNOSIS — I252 Old myocardial infarction: Secondary | ICD-10-CM | POA: Diagnosis not present

## 2022-08-06 DIAGNOSIS — M67844 Other specified disorders of tendon, left hand: Secondary | ICD-10-CM | POA: Diagnosis not present

## 2022-08-06 DIAGNOSIS — Z87891 Personal history of nicotine dependence: Secondary | ICD-10-CM | POA: Insufficient documentation

## 2022-08-06 DIAGNOSIS — I110 Hypertensive heart disease with heart failure: Secondary | ICD-10-CM | POA: Diagnosis not present

## 2022-08-06 DIAGNOSIS — S56119A Strain of flexor muscle, fascia and tendon of finger of unspecified finger at forearm level, initial encounter: Secondary | ICD-10-CM

## 2022-08-06 DIAGNOSIS — Z01818 Encounter for other preprocedural examination: Secondary | ICD-10-CM

## 2022-08-06 DIAGNOSIS — E1151 Type 2 diabetes mellitus with diabetic peripheral angiopathy without gangrene: Secondary | ICD-10-CM | POA: Diagnosis not present

## 2022-08-06 DIAGNOSIS — Z79899 Other long term (current) drug therapy: Secondary | ICD-10-CM | POA: Diagnosis not present

## 2022-08-06 DIAGNOSIS — X58XXXA Exposure to other specified factors, initial encounter: Secondary | ICD-10-CM | POA: Insufficient documentation

## 2022-08-06 HISTORY — DX: Presence of insulin pump (external) (internal): Z96.41

## 2022-08-06 HISTORY — DX: Strain of flexor muscle, fascia and tendon of finger of unspecified finger at forearm level, initial encounter: S56.119A

## 2022-08-06 HISTORY — DX: Personal history of other endocrine, nutritional and metabolic disease: Z86.39

## 2022-08-06 HISTORY — DX: Personal history of other venous thrombosis and embolism: Z86.718

## 2022-08-06 HISTORY — DX: Unspecified urethral stricture, male, unspecified site: N35.919

## 2022-08-06 HISTORY — DX: Localized edema: R60.0

## 2022-08-06 HISTORY — DX: Other specified diabetes mellitus with mild nonproliferative diabetic retinopathy without macular edema, unspecified eye: E13.3299

## 2022-08-06 HISTORY — DX: Type 1 diabetes mellitus without complications: E10.9

## 2022-08-06 HISTORY — DX: Other constipation: K59.09

## 2022-08-06 HISTORY — PX: FLEXOR TENDON REPAIR: SHX6501

## 2022-08-06 HISTORY — DX: Personal history of other malignant neoplasm of skin: Z85.828

## 2022-08-06 LAB — POCT I-STAT, CHEM 8
BUN: 23 mg/dL (ref 8–23)
Calcium, Ion: 1.25 mmol/L (ref 1.15–1.40)
Chloride: 107 mmol/L (ref 98–111)
Creatinine, Ser: 1 mg/dL (ref 0.61–1.24)
Glucose, Bld: 145 mg/dL — ABNORMAL HIGH (ref 70–99)
HCT: 39 % (ref 39.0–52.0)
Hemoglobin: 13.3 g/dL (ref 13.0–17.0)
Potassium: 4.1 mmol/L (ref 3.5–5.1)
Sodium: 143 mmol/L (ref 135–145)
TCO2: 28 mmol/L (ref 22–32)

## 2022-08-06 LAB — GLUCOSE, CAPILLARY: Glucose-Capillary: 300 mg/dL — ABNORMAL HIGH (ref 70–99)

## 2022-08-06 SURGERY — REPAIR, TENDON, FLEXOR
Anesthesia: Monitor Anesthesia Care | Site: Hand | Laterality: Left

## 2022-08-06 MED ORDER — FENTANYL CITRATE (PF) 100 MCG/2ML IJ SOLN: 100.0000 ug | Freq: Once | INTRAMUSCULAR | Status: DC

## 2022-08-06 MED ORDER — FENTANYL CITRATE (PF) 100 MCG/2ML IJ SOLN
INTRAMUSCULAR | Status: AC
  Filled 2022-08-06: qty 2

## 2022-08-06 MED ORDER — PROPOFOL 1000 MG/100ML IV EMUL
INTRAVENOUS | Status: AC
  Filled 2022-08-06: qty 100

## 2022-08-06 MED ORDER — LACTATED RINGERS IV SOLN: INTRAVENOUS | Status: DC

## 2022-08-06 MED ORDER — MIDAZOLAM HCL 2 MG/2ML IJ SOLN
2.0000 mg | Freq: Once | INTRAMUSCULAR | Status: AC
  Administered 2022-08-06: 2 mg via INTRAVENOUS

## 2022-08-06 MED ORDER — PROPOFOL 10 MG/ML IV BOLUS
INTRAVENOUS | Status: AC
  Filled 2022-08-06: qty 20

## 2022-08-06 MED ORDER — FENTANYL CITRATE (PF) 100 MCG/2ML IJ SOLN
INTRAMUSCULAR | Status: DC | PRN
  Administered 2022-08-06: 50 ug via INTRAVENOUS

## 2022-08-06 MED ORDER — CEFAZOLIN SODIUM-DEXTROSE 2-4 GM/100ML-% IV SOLN
INTRAVENOUS | Status: AC
  Filled 2022-08-06: qty 100

## 2022-08-06 MED ORDER — PROPOFOL 10 MG/ML IV BOLUS
INTRAVENOUS | Status: DC | PRN
  Administered 2022-08-06 (×2): 10 mg via INTRAVENOUS
  Administered 2022-08-06: 20 mg via INTRAVENOUS

## 2022-08-06 MED ORDER — CEFAZOLIN SODIUM-DEXTROSE 2-4 GM/100ML-% IV SOLN
2.0000 g | INTRAVENOUS | Status: AC
  Administered 2022-08-06: 2 g via INTRAVENOUS

## 2022-08-06 MED ORDER — MIDAZOLAM HCL 2 MG/2ML IJ SOLN
INTRAMUSCULAR | Status: AC
  Filled 2022-08-06: qty 2

## 2022-08-06 MED ORDER — ACETAMINOPHEN 500 MG PO TABS
1000.0000 mg | ORAL_TABLET | Freq: Once | ORAL | Status: AC
  Administered 2022-08-06: 1000 mg via ORAL

## 2022-08-06 MED ORDER — ONDANSETRON HCL 4 MG/2ML IJ SOLN
INTRAMUSCULAR | Status: DC | PRN
  Administered 2022-08-06: 4 mg via INTRAVENOUS

## 2022-08-06 MED ORDER — PROPOFOL 500 MG/50ML IV EMUL
INTRAVENOUS | Status: DC | PRN
  Administered 2022-08-06: 200 ug/kg/min via INTRAVENOUS

## 2022-08-06 MED ORDER — HYDROCODONE-ACETAMINOPHEN 5-325 MG PO TABS: 1.0000 | ORAL_TABLET | Freq: Four times a day (QID) | ORAL | 0 refills | Status: AC | PRN

## 2022-08-06 MED ORDER — PHENYLEPHRINE 80 MCG/ML (10ML) SYRINGE FOR IV PUSH (FOR BLOOD PRESSURE SUPPORT)
PREFILLED_SYRINGE | INTRAVENOUS | Status: DC | PRN
  Administered 2022-08-06: 160 ug via INTRAVENOUS
  Administered 2022-08-06 (×2): 80 ug via INTRAVENOUS
  Administered 2022-08-06: 160 ug via INTRAVENOUS
  Administered 2022-08-06 (×3): 80 ug via INTRAVENOUS

## 2022-08-06 MED ORDER — BUPIVACAINE-EPINEPHRINE (PF) 0.5% -1:200000 IJ SOLN
INTRAMUSCULAR | Status: DC | PRN
  Administered 2022-08-06: 30 mL via PERINEURAL

## 2022-08-06 MED ORDER — ACETAMINOPHEN 500 MG PO TABS
ORAL_TABLET | ORAL | Status: AC
  Filled 2022-08-06: qty 2

## 2022-08-06 MED ORDER — ONDANSETRON HCL 4 MG/2ML IJ SOLN
INTRAMUSCULAR | Status: AC
  Filled 2022-08-06: qty 2

## 2022-08-06 MED ORDER — 0.9 % SODIUM CHLORIDE (POUR BTL) OPTIME
TOPICAL | Status: DC | PRN
  Administered 2022-08-06: 1000 mL

## 2022-08-06 MED ORDER — PHENYLEPHRINE 80 MCG/ML (10ML) SYRINGE FOR IV PUSH (FOR BLOOD PRESSURE SUPPORT)
PREFILLED_SYRINGE | INTRAVENOUS | Status: AC
  Filled 2022-08-06: qty 10

## 2022-08-06 MED ORDER — PROPOFOL 500 MG/50ML IV EMUL
INTRAVENOUS | Status: AC
  Filled 2022-08-06: qty 50

## 2022-08-06 MED ORDER — CARVEDILOL 6.25 MG PO TABS
6.2500 mg | ORAL_TABLET | Freq: Once | ORAL | Status: AC
  Administered 2022-08-06: 6.25 mg via ORAL
  Filled 2022-08-06: qty 1

## 2022-08-06 SURGICAL SUPPLY — 32 items
BLADE SURG 15 STRL LF DISP TIS (BLADE) ×1 IMPLANT
BLADE SURG 15 STRL SS (BLADE) ×1
BNDG CMPR 9X4 STRL LF SNTH (GAUZE/BANDAGES/DRESSINGS) ×1
BNDG COHESIVE 1X5 TAN STRL LF (GAUZE/BANDAGES/DRESSINGS) ×1 IMPLANT
BNDG ESMARK 4X9 LF (GAUZE/BANDAGES/DRESSINGS) ×1 IMPLANT
COVER BACK TABLE 60X90IN (DRAPES) ×1 IMPLANT
CUFF TOURN SGL QUICK 18X4 (TOURNIQUET CUFF) IMPLANT
DRAPE EXTREMITY T 121X128X90 (DISPOSABLE) ×1 IMPLANT
DRAPE SHEET LG 3/4 BI-LAMINATE (DRAPES) ×1 IMPLANT
DRAPE SURG 17X23 STRL (DRAPES) ×1 IMPLANT
DRSG EMULSION OIL 3X3 NADH (GAUZE/BANDAGES/DRESSINGS) ×1 IMPLANT
GAUZE 4X4 16PLY ~~LOC~~+RFID DBL (SPONGE) ×1 IMPLANT
GAUZE SPONGE 4X4 12PLY STRL LF (GAUZE/BANDAGES/DRESSINGS) ×1 IMPLANT
GLOVE BIOGEL PI IND STRL 7.5 (GLOVE) ×1 IMPLANT
GOWN STRL REUS W/ TWL LRG LVL3 (GOWN DISPOSABLE) ×1 IMPLANT
GOWN STRL REUS W/TWL LRG LVL3 (GOWN DISPOSABLE) ×1
HIBICLENS CHG 4% 4OZ BTL (MISCELLANEOUS) ×1 IMPLANT
NEEDLE HYPO 22GX1.5 SAFETY (NEEDLE) ×1 IMPLANT
NS IRRIG 1000ML POUR BTL (IV SOLUTION) ×1 IMPLANT
PACK BASIN DAY SURGERY FS (CUSTOM PROCEDURE TRAY) ×1 IMPLANT
PAD CAST 4YDX4 CTTN HI CHSV (CAST SUPPLIES) IMPLANT
PADDING CAST COTTON 4X4 STRL (CAST SUPPLIES)
SUT CHROMIC 4 0 PS 2 18 (SUTURE) ×1 IMPLANT
SUT ETHIBOND 3-0 V-5 (SUTURE) ×1 IMPLANT
SUT ETHILON 4 0 PS 2 18 (SUTURE) IMPLANT
SUT ETHILON 5 0 P 3 18 (SUTURE)
SUT NYLON ETHILON 5-0 P-3 1X18 (SUTURE) IMPLANT
SYR 10ML LL (SYRINGE) ×1 IMPLANT
SYR BULB EAR ULCER 3OZ GRN STR (SYRINGE) ×1 IMPLANT
TOWEL OR 17X26 10 PK STRL BLUE (TOWEL DISPOSABLE) ×1 IMPLANT
TRAY DSU PREP LF (CUSTOM PROCEDURE TRAY) ×1 IMPLANT
UNDERPAD 30X36 HEAVY ABSORB (UNDERPADS AND DIAPERS) ×1 IMPLANT

## 2022-08-06 NOTE — Progress Notes (Signed)
Patient resumed insulin pump according to his usual regimen.

## 2022-08-06 NOTE — Anesthesia Procedure Notes (Signed)
Anesthesia Regional Block: Axillary brachial plexus block   Pre-Anesthetic Checklist: , timeout performed,  Correct Patient, Correct Site, Correct Laterality,  Correct Procedure, Correct Position, site marked,  Risks and benefits discussed,  Surgical consent,  Pre-op evaluation,  At surgeon's request and post-op pain management  Laterality: Left  Prep: chloraprep       Needles:  Injection technique: Single-shot  Needle Type: Echogenic Stimulator Needle     Needle Length: 9cm  Needle Gauge: 21     Additional Needles:   Procedures:,,,, ultrasound used (permanent image in chart),,    Narrative:  Start time: 08/06/2022 7:05 AM End time: 08/06/2022 7:15 AM Injection made incrementally with aspirations every 5 mL.  Performed by: Personally  Anesthesiologist: Murvin Natal, MD  Additional Notes: Functioning IV was confirmed and monitors were applied.  A timeout was performed. Sterile prep, hand hygiene and sterile gloves were used. A 60m 21ga Arrow echogenic stimulator needle was used. Negative aspiration and negative test dose prior to incremental administration of local anesthetic. The patient tolerated the procedure well.  Ultrasound guidance: relevent anatomy identified, needle position confirmed, local anesthetic spread visualized around nerve(s), vascular puncture avoided.  Image printed for medical record.

## 2022-08-06 NOTE — Transfer of Care (Signed)
Immediate Anesthesia Transfer of Care Note  Patient: [name]  Procedure(s) Performed: Left middle finger flexor tendon reconstruction stage 1 (Left: Hand)  Patient Location: PACU  Anesthesia Type:MAC and Regional  Level of Consciousness: awake, alert , oriented and patient cooperative  Airway & Oxygen Therapy: Patient Spontanous Breathing  Post-op Assessment: Report given to RN and Post -op Vital signs reviewed and stable  Post vital signs: Reviewed and stable  Last Vitals:  Vitals Value Taken Time  BP 116/57 08/06/22 0915  Temp    Pulse 81 08/06/22 0918  Resp 22 08/06/22 0918  SpO2 97 % 08/06/22 0918  Vitals shown include unvalidated device data.  Last Pain:  Vitals:   08/06/22 0544  TempSrc: Oral         Complications: No notable events documented.

## 2022-08-06 NOTE — Progress Notes (Signed)
Assisted Dr. Roanna Banning with left, axillary, ultrasound guided block. Side rails up, monitors on throughout procedure. See vital signs in flow sheet. Tolerated Procedure well.

## 2022-08-06 NOTE — Discharge Instructions (Addendum)
Orthopaedic Hand Surgery Discharge Instructions  WEIGHT BEARING STATUS: Non weight bearing on operative extremity  DRESSING CARE: Please keep your dressing/splint/cast clean and dry until your follow-up appointment. You may shower by placing a waterproof covering over your dressing/splint/cast. Contact your surgeon if your splint/cast gets wet. It will need to be changed to prevent skin breakdown.  PAIN CONTROL: First line medications for post operative pain control are Tylenol (acetaminophen) and Motrin (ibuprofen) if you are able to take these medications. If you have been prescribed a medication these can be taken as breakthrough pain medications. Please note that some narcotic pain medication has acetaminophen added and you should never consume more than 4,'000mg'$  of acetaminophen in 24-hour period. Please note that if you are given Toradol (ketorolac) you should not take similar medications such as ibuprofen or naproxen.  DISCHARGE MEDICATIONS: If you have been prescribed medication it was sent electronically to your pharmacy. No changes have been made to your home medications.  ICE/ELEVATION: Ice and elevate your injured extremity as needed. Avoid direct contact of ice with skin.   BANDAGE FEELS TOO TIGHT: If your bandage feels too tight, first make sure you are elevating your fingers as much as possible. The outer layer of the bandage can be unwrapped and reapplied more loosely. If no improvement, you may carefully cut the inner layer longitudinally until the pressure has resolved and then rewrap the outer layer. If you are not comfortable with these instructions, please call the office and the bandage can be changed for you.   FOLLOW UP: You will be called after surgery with an appointment date and time, however if you have not received a phone call within 3 days, please call during regular office hours at [phone] to schedule a post operative appointment.  Please Seek Medical Attention  if: Call MD for: pain or pressure in chest, jaw, arm, back, neck  Call MD for: temperature greater than 101 F for more than 24 hrs Call MD for: difficulty breathing Call MD for: incision redness, bleeding, drainage  Call MD for: palpitations or feeling that the heart is racing  Call MD for: increased swelling in arm, leg, ankle, or abdomen  Call MD for: lightheadedness, dizziness, fainting Call 911 or go to ER for any medical emergency if you are not able to get in touch with your doctor   J. Sable Feil, MD Orthopaedic Hand Surgeon [facility] Office number: [phone] [address] [place], [place] [address]    Post Anesthesia Home Care Instructions  Activity: Get plenty of rest for the remainder of the day. A responsible individual must stay with you for 24 hours following the procedure.  For the next 24 hours, DO NOT: -Drive a car -Paediatric nurse -Drink alcoholic beverages -Take any medication unless instructed by your physician -Make any legal decisions or sign important papers.  Meals: Start with liquid foods such as gelatin or soup. Progress to regular foods as tolerated. Avoid greasy, spicy, heavy foods. If nausea and/or vomiting occur, drink only clear liquids until the nausea and/or vomiting subsides. Call your physician if vomiting continues.  Special Instructions/Symptoms: Your throat may feel dry or sore from the anesthesia or the breathing tube placed in your throat during surgery. If this causes discomfort, gargle with warm salt water. The discomfort should disappear within 24 hours.  Do not take any Tylenol until after 12:30 pm today if needed.      Regional Anesthesia Blocks  1. Numbness or the inability to move the "  blocked" extremity may last from 3-48 hours after placement. The length of time depends on the medication injected and your individual response to the medication. If the numbness is not going away after 48 hours, call your  surgeon.  2. The extremity that is blocked will need to be protected until the numbness is gone and the  Strength has returned. Because you cannot feel it, you will need to take extra care to avoid injury. Because it may be weak, you may have difficulty moving it or using it. You may not know what position it is in without looking at it while the block is in effect.  3. For blocks in the legs and feet, returning to weight bearing and walking needs to be done carefully. You will need to wait until the numbness is entirely gone and the strength has returned. You should be able to move your leg and foot normally before you try and bear weight or walk. You will need someone to be with you when you first try to ensure you do not fall and possibly risk injury.  4. Bruising and tenderness at the needle site are common side effects and will resolve in a few days.  5. Persistent numbness or new problems with movement should be communicated to the surgeon or the [facility] [phone] [place] [phone]).

## 2022-08-06 NOTE — Op Note (Signed)
OPERATIVE NOTE  DATE OF PROCEDURE: 08/06/2022  SURGEONS:  Primary: Orene Desanctis, MD  PREOPERATIVE DIAGNOSIS: Left middle finger flexor tendon rupture  POSTOPERATIVE DIAGNOSIS: Same  NAME OF PROCEDURE:   Left middle finger flexor digitorum profundus tendon primary repair zone 2 Left middle finger flexor digitorum superficialis tendon excision  ANESTHESIA: Block + MAC  SKIN PREPARATION: Hibiclens  ESTIMATED BLOOD LOSS: Minimal  IMPLANTS: none  INDICATIONS:  [name] is a 76 y.o. male who has the above preoperative diagnosis. The patient has decided to proceed with surgical intervention.  Risks, benefits and alternatives of operative management were discussed including, but not limited to, risks of anesthesia complications, infection, pain, persistent symptoms, stiffness, need for future surgery.  The patient understands, agrees and elects to proceed with surgery.    DESCRIPTION OF PROCEDURE: The patient was met in the pre-operative area and their identity was verified.  The operative location and laterality was also verified and marked.  The patient was brought to the OR and was placed supine on the table.  After repeat patient identification with the operative team anesthesia was provided and the patient was prepped and draped in the usual sterile fashion.  A final timeout was performed verifying the correction patient, procedure, location and laterality.  The left upper extremity was elevated exsanguinated with an Esmarch and tourniquet inflated to 250 mmHg.  A Bruner incision was made across the volar aspect of the left middle finger from the palm up to the level of the A4 pulley. Skin and subcutaneous tissues were divided and careful hemostasis was obtained.  Care was taken to protect the neurovascular bundles both radially and ulnarly.  There was thick scar throughout the wound bed from the level of the A3 pulley and into the palm.  The scar was excised and the tendons were identified.   The A4 pulley and distal remained intact.  There was good tendon glide of the flexor digitorum profundus stump which remained attached to the distal phalanx.  The dissection was carried out proximally and the flexor digitorum superficialis was significantly desiccated in appearance and in poor shape for repair.  The proximal stump of the flexor digitorum superficialis was retracted, short and not repairable primarily.  The flexor digitorum profundus was in excellent condition and was just folded back on itself and had excellent excursion.  Unfortunately the A1 and A2 pulleys were scarred and nonfunctional.  The ends of the profundus tendon were trimmed and were robust and in good shape for primary repair.  The tension was tested with a cascade of the other digits and there was some bowstringing proximally however the A4 pulley and A5 pulley and the cruciates distally were intact and the decision was made to perform primary repair and forego the silicone rod placement and two-stage reconstruction.  The wound was clean with no signs of infection.  Tenolysis was performed of the FDP both proximally and distally and there is excellent gliding through the A4 pulley distally and excellent excursion from the proximal stump with no tension.  The flexor digitorum superficialis tendon was excised in its entirety to allow for improved gliding of the flexor digitorum profundus.  The wound was thoroughly irrigated with normal saline.  A primary repair of the flexor digitorum profundus within zone 2 was performed with 3-0 Ethibond with a 4 core M-Tang configuration and circumferential running epitendinous repair.  The finger was brought through full range of motion and the repair was proximal to the A4 pulley with no interference with  gliding.  Again there was some bowstringing at the level of the A1 and A2 pulleys however due to the intact A4 pulley there was still improved cascade of the digit similar to the other digits with  the benefit of a single surgery versus a two-stage reconstruction I feel this will facilitate his recovery and limit the risk profile of a second operation.  The wound was again thoroughly irrigated and the incision was closed with 4-0 nylon suture.  The tourniquet was deflated and the finger was pink and warm and well-perfused with brisk capillary refill immediately after deflating the tourniquet.  Sterile soft bandage was applied followed by a bulky dorsal block splint with the fingers in gentle flexion.  All counts were correct x2.  Patient was awoken from anesthesia and brought to PACU for recovery in stable condition.   [name] Holmes, MD

## 2022-08-06 NOTE — Interval H&P Note (Signed)
History and Physical Interval Note:  08/06/2022 7:30 AM  [name]  has presented today for surgery, with the diagnosis of Left middle finger flexor tendon rupture.  The various methods of treatment have been discussed with the patient and family. After consideration of risks, benefits and other options for treatment, the patient has consented to  Procedure(s): Left middle finger flexor tendon reconstruction stage 1 (Left) as a surgical intervention.  The patient's history has been reviewed, patient examined, no change in status, stable for surgery.  I have reviewed the patient's chart and labs.  Questions were answered to the patient's satisfaction.     Orene Desanctis

## 2022-08-06 NOTE — Anesthesia Preprocedure Evaluation (Addendum)
Anesthesia Evaluation  Patient identified by MRN, date of birth, ID band Patient awake    Reviewed: Allergy & Precautions, NPO status , Patient's Chart, lab work & pertinent test results  Airway Mallampati: II  TM Distance: >3 FB Neck ROM: Full    Dental  (+) Missing   Pulmonary former smoker,    Pulmonary exam normal        Cardiovascular hypertension, Pt. on home beta blockers and Pt. on medications + CAD, + Past MI, + Cardiac Stents, + Peripheral Vascular Disease, +CHF and + DVT  Normal cardiovascular exam     Neuro/Psych negative neurological ROS  negative psych ROS   GI/Hepatic Neg liver ROS, GERD  Medicated and Controlled,  Endo/Other  diabetes, Insulin Dependent  Renal/GU negative Renal ROS     Musculoskeletal  (+) Arthritis ,   Abdominal   Peds  Hematology negative hematology ROS (+)   Anesthesia Other Findings Left middle finger flexor tendon rupture  Reproductive/Obstetrics                            Anesthesia Physical Anesthesia Plan  ASA: 3  Anesthesia Plan: Regional   Post-op Pain Management:    Induction: Intravenous  PONV Risk Score and Plan: 1 and Ondansetron, Dexamethasone, Propofol infusion, Midazolam and Treatment may vary due to age or medical condition  Airway Management Planned: Simple Face Mask  Additional Equipment:   Intra-op Plan:   Post-operative Plan:   Informed Consent: I have reviewed the patients History and Physical, chart, labs and discussed the procedure including the risks, benefits and alternatives for the proposed anesthesia with the patient or authorized representative who has indicated his/her understanding and acceptance.     Dental advisory given  Plan Discussed with: CRNA and Surgeon  Anesthesia Plan Comments:        Anesthesia Quick Evaluation

## 2022-08-06 NOTE — Anesthesia Postprocedure Evaluation (Signed)
Anesthesia Post Note  Patient: [name]  Procedure(s) Performed: Left middle finger flexor tendon reconstruction stage 1 (Left: Hand)     Patient location during evaluation: PACU Anesthesia Type: Regional Level of consciousness: awake Pain management: pain level controlled Vital Signs Assessment: post-procedure vital signs reviewed and stable Respiratory status: spontaneous breathing, nonlabored ventilation, respiratory function stable and patient connected to nasal cannula oxygen Cardiovascular status: stable and blood pressure returned to baseline Postop Assessment: no apparent nausea or vomiting Anesthetic complications: no   No notable events documented.  Last Vitals:  Vitals:   08/06/22 1000 08/06/22 1020  BP: 133/68 131/67  Pulse: 85 81  Resp: 19 16  Temp:  (!) 36.4 C  SpO2: 98% 95%    Last Pain:  Vitals:   08/06/22 1000  TempSrc:   PainSc: 0-No pain                 Logyn Kendrick P Shanelle Clontz

## 2022-08-07 ENCOUNTER — Encounter (HOSPITAL_BASED_OUTPATIENT_CLINIC_OR_DEPARTMENT_OTHER): Payer: Self-pay | Admitting: Orthopedic Surgery

## 2022-08-08 DIAGNOSIS — M25641 Stiffness of right hand, not elsewhere classified: Secondary | ICD-10-CM | POA: Diagnosis not present

## 2022-08-15 DIAGNOSIS — E109 Type 1 diabetes mellitus without complications: Secondary | ICD-10-CM | POA: Diagnosis not present

## 2022-08-16 DIAGNOSIS — M25641 Stiffness of right hand, not elsewhere classified: Secondary | ICD-10-CM | POA: Diagnosis not present

## 2022-08-22 DIAGNOSIS — M25641 Stiffness of right hand, not elsewhere classified: Secondary | ICD-10-CM | POA: Diagnosis not present

## 2022-08-28 DIAGNOSIS — R238 Other skin changes: Secondary | ICD-10-CM | POA: Diagnosis not present

## 2022-08-29 DIAGNOSIS — M25641 Stiffness of right hand, not elsewhere classified: Secondary | ICD-10-CM | POA: Diagnosis not present

## 2022-09-02 DIAGNOSIS — E1065 Type 1 diabetes mellitus with hyperglycemia: Secondary | ICD-10-CM | POA: Diagnosis not present

## 2022-09-05 DIAGNOSIS — C44519 Basal cell carcinoma of skin of other part of trunk: Secondary | ICD-10-CM | POA: Diagnosis not present

## 2022-09-05 DIAGNOSIS — D485 Neoplasm of uncertain behavior of skin: Secondary | ICD-10-CM | POA: Diagnosis not present

## 2022-09-05 DIAGNOSIS — M25641 Stiffness of right hand, not elsewhere classified: Secondary | ICD-10-CM | POA: Diagnosis not present

## 2022-09-12 DIAGNOSIS — N390 Urinary tract infection, site not specified: Secondary | ICD-10-CM | POA: Diagnosis not present

## 2022-09-12 DIAGNOSIS — N319 Neuromuscular dysfunction of bladder, unspecified: Secondary | ICD-10-CM | POA: Diagnosis not present

## 2022-09-12 DIAGNOSIS — Z8551 Personal history of malignant neoplasm of bladder: Secondary | ICD-10-CM | POA: Diagnosis not present

## 2022-09-12 DIAGNOSIS — R972 Elevated prostate specific antigen [PSA]: Secondary | ICD-10-CM | POA: Diagnosis not present

## 2022-09-17 DIAGNOSIS — M25641 Stiffness of right hand, not elsewhere classified: Secondary | ICD-10-CM | POA: Diagnosis not present

## 2022-09-19 DIAGNOSIS — M25641 Stiffness of right hand, not elsewhere classified: Secondary | ICD-10-CM | POA: Diagnosis not present

## 2022-09-23 DIAGNOSIS — E103313 Type 1 diabetes mellitus with moderate nonproliferative diabetic retinopathy with macular edema, bilateral: Secondary | ICD-10-CM | POA: Diagnosis not present

## 2022-09-24 DIAGNOSIS — E103212 Type 1 diabetes mellitus with mild nonproliferative diabetic retinopathy with macular edema, left eye: Secondary | ICD-10-CM | POA: Diagnosis not present

## 2022-09-24 DIAGNOSIS — E1059 Type 1 diabetes mellitus with other circulatory complications: Secondary | ICD-10-CM | POA: Diagnosis not present

## 2022-09-24 DIAGNOSIS — N35011 Post-traumatic bulbous urethral stricture: Secondary | ICD-10-CM | POA: Diagnosis not present

## 2022-09-24 DIAGNOSIS — E1042 Type 1 diabetes mellitus with diabetic polyneuropathy: Secondary | ICD-10-CM | POA: Diagnosis not present

## 2022-09-24 DIAGNOSIS — Z125 Encounter for screening for malignant neoplasm of prostate: Secondary | ICD-10-CM | POA: Diagnosis not present

## 2022-09-24 DIAGNOSIS — Z9641 Presence of insulin pump (external) (internal): Secondary | ICD-10-CM | POA: Diagnosis not present

## 2022-09-24 DIAGNOSIS — N3020 Other chronic cystitis without hematuria: Secondary | ICD-10-CM | POA: Diagnosis not present

## 2022-09-24 DIAGNOSIS — R972 Elevated prostate specific antigen [PSA]: Secondary | ICD-10-CM | POA: Diagnosis not present

## 2022-09-24 DIAGNOSIS — Z8551 Personal history of malignant neoplasm of bladder: Secondary | ICD-10-CM | POA: Diagnosis not present

## 2022-09-26 DIAGNOSIS — M25641 Stiffness of right hand, not elsewhere classified: Secondary | ICD-10-CM | POA: Diagnosis not present

## 2022-09-27 ENCOUNTER — Other Ambulatory Visit: Payer: Self-pay

## 2022-09-27 MED ORDER — ATORVASTATIN CALCIUM 80 MG PO TABS: 80.0000 mg | ORAL_TABLET | Freq: Every day | ORAL | 0 refills | Status: DC

## 2022-09-30 DIAGNOSIS — M25562 Pain in left knee: Secondary | ICD-10-CM | POA: Diagnosis not present

## 2022-10-02 ENCOUNTER — Other Ambulatory Visit
Admission: RE | Admit: 2022-10-02 | Discharge: 2022-10-02 | Disposition: A | Discharge status: Home / Self Care | Payer: Medicare HMO | Source: Ambulatory Visit | Attending: Family Medicine | Admitting: Family Medicine

## 2022-10-02 ENCOUNTER — Emergency Department: Payer: Medicare HMO

## 2022-10-02 ENCOUNTER — Inpatient Hospital Stay
Admission: EM | Admit: 2022-10-02 | Discharge: 2022-10-14 | DRG: 485 | Disposition: A | Discharge status: Skilled Nursing Facility | Payer: Medicare HMO | Attending: Internal Medicine | Admitting: Internal Medicine

## 2022-10-02 ENCOUNTER — Encounter: Payer: Self-pay | Admitting: Emergency Medicine

## 2022-10-02 DIAGNOSIS — Z792 Long term (current) use of antibiotics: Secondary | ICD-10-CM | POA: Diagnosis not present

## 2022-10-02 DIAGNOSIS — I110 Hypertensive heart disease with heart failure: Secondary | ICD-10-CM | POA: Diagnosis present

## 2022-10-02 DIAGNOSIS — T68XXXA Hypothermia, initial encounter: Secondary | ICD-10-CM | POA: Diagnosis not present

## 2022-10-02 DIAGNOSIS — D75838 Other thrombocytosis: Secondary | ICD-10-CM | POA: Diagnosis present

## 2022-10-02 DIAGNOSIS — Z8614 Personal history of Methicillin resistant Staphylococcus aureus infection: Secondary | ICD-10-CM

## 2022-10-02 DIAGNOSIS — Z8551 Personal history of malignant neoplasm of bladder: Secondary | ICD-10-CM | POA: Diagnosis not present

## 2022-10-02 DIAGNOSIS — D649 Anemia, unspecified: Secondary | ICD-10-CM | POA: Diagnosis present

## 2022-10-02 DIAGNOSIS — I2510 Atherosclerotic heart disease of native coronary artery without angina pectoris: Secondary | ICD-10-CM | POA: Diagnosis present

## 2022-10-02 DIAGNOSIS — I82452 Acute embolism and thrombosis of left peroneal vein: Secondary | ICD-10-CM | POA: Diagnosis present

## 2022-10-02 DIAGNOSIS — I255 Ischemic cardiomyopathy: Secondary | ICD-10-CM | POA: Diagnosis present

## 2022-10-02 DIAGNOSIS — E1065 Type 1 diabetes mellitus with hyperglycemia: Secondary | ICD-10-CM | POA: Diagnosis present

## 2022-10-02 DIAGNOSIS — B9561 Methicillin susceptible Staphylococcus aureus infection as the cause of diseases classified elsewhere: Secondary | ICD-10-CM | POA: Diagnosis not present

## 2022-10-02 DIAGNOSIS — I5043 Acute on chronic combined systolic (congestive) and diastolic (congestive) heart failure: Secondary | ICD-10-CM | POA: Diagnosis not present

## 2022-10-02 DIAGNOSIS — Z8249 Family history of ischemic heart disease and other diseases of the circulatory system: Secondary | ICD-10-CM

## 2022-10-02 DIAGNOSIS — Z85828 Personal history of other malignant neoplasm of skin: Secondary | ICD-10-CM | POA: Diagnosis not present

## 2022-10-02 DIAGNOSIS — E109 Type 1 diabetes mellitus without complications: Secondary | ICD-10-CM | POA: Diagnosis not present

## 2022-10-02 DIAGNOSIS — Z794 Long term (current) use of insulin: Secondary | ICD-10-CM | POA: Diagnosis not present

## 2022-10-02 DIAGNOSIS — I959 Hypotension, unspecified: Secondary | ICD-10-CM | POA: Diagnosis not present

## 2022-10-02 DIAGNOSIS — M23204 Derangement of unspecified medial meniscus due to old tear or injury, left knee: Secondary | ICD-10-CM | POA: Diagnosis not present

## 2022-10-02 DIAGNOSIS — S83232D Complex tear of medial meniscus, current injury, left knee, subsequent encounter: Secondary | ICD-10-CM | POA: Diagnosis not present

## 2022-10-02 DIAGNOSIS — E103299 Type 1 diabetes mellitus with mild nonproliferative diabetic retinopathy without macular edema, unspecified eye: Secondary | ICD-10-CM | POA: Diagnosis present

## 2022-10-02 DIAGNOSIS — Z8042 Family history of malignant neoplasm of prostate: Secondary | ICD-10-CM

## 2022-10-02 DIAGNOSIS — I330 Acute and subacute infective endocarditis: Secondary | ICD-10-CM | POA: Diagnosis not present

## 2022-10-02 DIAGNOSIS — Z9641 Presence of insulin pump (external) (internal): Secondary | ICD-10-CM | POA: Diagnosis present

## 2022-10-02 DIAGNOSIS — Z7901 Long term (current) use of anticoagulants: Secondary | ICD-10-CM | POA: Diagnosis not present

## 2022-10-02 DIAGNOSIS — T380X5A Adverse effect of glucocorticoids and synthetic analogues, initial encounter: Secondary | ICD-10-CM | POA: Diagnosis present

## 2022-10-02 DIAGNOSIS — E1051 Type 1 diabetes mellitus with diabetic peripheral angiopathy without gangrene: Secondary | ICD-10-CM | POA: Diagnosis present

## 2022-10-02 DIAGNOSIS — Z87442 Personal history of urinary calculi: Secondary | ICD-10-CM

## 2022-10-02 DIAGNOSIS — R7881 Bacteremia: Secondary | ICD-10-CM | POA: Diagnosis not present

## 2022-10-02 DIAGNOSIS — M7122 Synovial cyst of popliteal space [Baker], left knee: Secondary | ICD-10-CM | POA: Diagnosis not present

## 2022-10-02 DIAGNOSIS — Z87891 Personal history of nicotine dependence: Secondary | ICD-10-CM | POA: Diagnosis not present

## 2022-10-02 DIAGNOSIS — S83232A Complex tear of medial meniscus, current injury, left knee, initial encounter: Secondary | ICD-10-CM | POA: Diagnosis present

## 2022-10-02 DIAGNOSIS — M1712 Unilateral primary osteoarthritis, left knee: Secondary | ICD-10-CM | POA: Diagnosis not present

## 2022-10-02 DIAGNOSIS — R52 Pain, unspecified: Secondary | ICD-10-CM | POA: Insufficient documentation

## 2022-10-02 DIAGNOSIS — I951 Orthostatic hypotension: Secondary | ICD-10-CM | POA: Diagnosis not present

## 2022-10-02 DIAGNOSIS — I429 Cardiomyopathy, unspecified: Secondary | ICD-10-CM | POA: Diagnosis not present

## 2022-10-02 DIAGNOSIS — K219 Gastro-esophageal reflux disease without esophagitis: Secondary | ICD-10-CM | POA: Diagnosis not present

## 2022-10-02 DIAGNOSIS — E875 Hyperkalemia: Secondary | ICD-10-CM | POA: Diagnosis not present

## 2022-10-02 DIAGNOSIS — R1030 Lower abdominal pain, unspecified: Secondary | ICD-10-CM | POA: Diagnosis not present

## 2022-10-02 DIAGNOSIS — M7989 Other specified soft tissue disorders: Secondary | ICD-10-CM | POA: Diagnosis present

## 2022-10-02 DIAGNOSIS — R040 Epistaxis: Secondary | ICD-10-CM | POA: Diagnosis present

## 2022-10-02 DIAGNOSIS — I252 Old myocardial infarction: Secondary | ICD-10-CM

## 2022-10-02 DIAGNOSIS — I25118 Atherosclerotic heart disease of native coronary artery with other forms of angina pectoris: Secondary | ICD-10-CM | POA: Diagnosis not present

## 2022-10-02 DIAGNOSIS — M009 Pyogenic arthritis, unspecified: Secondary | ICD-10-CM | POA: Diagnosis present

## 2022-10-02 DIAGNOSIS — N323 Diverticulum of bladder: Secondary | ICD-10-CM | POA: Diagnosis present

## 2022-10-02 DIAGNOSIS — M25462 Effusion, left knee: Secondary | ICD-10-CM | POA: Diagnosis present

## 2022-10-02 DIAGNOSIS — I5041 Acute combined systolic (congestive) and diastolic (congestive) heart failure: Secondary | ICD-10-CM | POA: Diagnosis not present

## 2022-10-02 DIAGNOSIS — M01X62 Direct infection of left knee in infectious and parasitic diseases classified elsewhere: Secondary | ICD-10-CM | POA: Diagnosis not present

## 2022-10-02 DIAGNOSIS — I82402 Acute embolism and thrombosis of unspecified deep veins of left lower extremity: Principal | ICD-10-CM

## 2022-10-02 DIAGNOSIS — Z9889 Other specified postprocedural states: Secondary | ICD-10-CM | POA: Diagnosis not present

## 2022-10-02 DIAGNOSIS — K5909 Other constipation: Secondary | ICD-10-CM | POA: Diagnosis present

## 2022-10-02 DIAGNOSIS — I824Z2 Acute embolism and thrombosis of unspecified deep veins of left distal lower extremity: Secondary | ICD-10-CM | POA: Diagnosis not present

## 2022-10-02 DIAGNOSIS — I10 Essential (primary) hypertension: Secondary | ICD-10-CM | POA: Diagnosis present

## 2022-10-02 DIAGNOSIS — M00062 Staphylococcal arthritis, left knee: Secondary | ICD-10-CM | POA: Diagnosis present

## 2022-10-02 DIAGNOSIS — E785 Hyperlipidemia, unspecified: Secondary | ICD-10-CM | POA: Diagnosis present

## 2022-10-02 DIAGNOSIS — B9562 Methicillin resistant Staphylococcus aureus infection as the cause of diseases classified elsewhere: Secondary | ICD-10-CM | POA: Diagnosis present

## 2022-10-02 DIAGNOSIS — Z452 Encounter for adjustment and management of vascular access device: Secondary | ICD-10-CM | POA: Diagnosis not present

## 2022-10-02 DIAGNOSIS — Z79899 Other long term (current) drug therapy: Secondary | ICD-10-CM | POA: Diagnosis not present

## 2022-10-02 DIAGNOSIS — Z955 Presence of coronary angioplasty implant and graft: Secondary | ICD-10-CM

## 2022-10-02 DIAGNOSIS — R609 Edema, unspecified: Secondary | ICD-10-CM | POA: Diagnosis not present

## 2022-10-02 DIAGNOSIS — N135 Crossing vessel and stricture of ureter without hydronephrosis: Secondary | ICD-10-CM | POA: Diagnosis present

## 2022-10-02 DIAGNOSIS — I420 Dilated cardiomyopathy: Secondary | ICD-10-CM | POA: Diagnosis not present

## 2022-10-02 DIAGNOSIS — N179 Acute kidney failure, unspecified: Secondary | ICD-10-CM | POA: Diagnosis present

## 2022-10-02 DIAGNOSIS — M00862 Arthritis due to other bacteria, left knee: Secondary | ICD-10-CM | POA: Diagnosis not present

## 2022-10-02 DIAGNOSIS — I82409 Acute embolism and thrombosis of unspecified deep veins of unspecified lower extremity: Secondary | ICD-10-CM | POA: Diagnosis present

## 2022-10-02 DIAGNOSIS — N35919 Unspecified urethral stricture, male, unspecified site: Secondary | ICD-10-CM | POA: Diagnosis present

## 2022-10-02 DIAGNOSIS — G8929 Other chronic pain: Secondary | ICD-10-CM | POA: Diagnosis not present

## 2022-10-02 DIAGNOSIS — R2243 Localized swelling, mass and lump, lower limb, bilateral: Secondary | ICD-10-CM | POA: Diagnosis not present

## 2022-10-02 DIAGNOSIS — M25562 Pain in left knee: Secondary | ICD-10-CM | POA: Diagnosis not present

## 2022-10-02 DIAGNOSIS — R600 Localized edema: Secondary | ICD-10-CM | POA: Diagnosis not present

## 2022-10-02 DIAGNOSIS — E10618 Type 1 diabetes mellitus with other diabetic arthropathy: Secondary | ICD-10-CM | POA: Diagnosis not present

## 2022-10-02 DIAGNOSIS — Z7982 Long term (current) use of aspirin: Secondary | ICD-10-CM

## 2022-10-02 LAB — D-DIMER, QUANTITATIVE: D-Dimer, Quant: 3.38 ug/mL-FEU — ABNORMAL HIGH (ref 0.00–0.50)

## 2022-10-02 NOTE — ED Provider Notes (Signed)
[facility] Provider Note    Event Date/Time   First MD Initiated Contact with Patient 10/02/22 2321     (approximate)   History   Left knee pain   HPI  JAHMEZ BILY is a 76 y.o. male with several days of pain in his left knee.  It hurts to flex it.  He was started on Mobic 2 days ago.  He was seen in the office today and had an knee injection with Kenalog and lidocaine but this has not helped.  Labs today in the office showed a white count of 19,000 and a sed rate of 79.  D-dimer was 3.  X-rays were done but I cannot find any report of the x-ray.      Physical Exam   Triage Vital Signs: ED Triage Vitals  Enc Vitals Group     BP 10/02/22 2230 (!) 115/97     Pulse Rate 10/02/22 2230 95     Resp 10/02/22 2230 16     Temp 10/02/22 2230 98.3 F (36.8 C)     Temp Source 10/02/22 2230 Oral     SpO2 10/02/22 2230 99 %     Weight 10/02/22 2229 195 lb (88.5 kg)     Height 10/02/22 2229 '5\' 9"'$  (1.753 m)     Head Circumference --      Peak Flow --      Pain Score --      Pain Loc --      Pain Edu? --      Excl. in [place]? --     Most recent vital signs: Vitals:   10/03/22 0430 10/03/22 0451  BP: (!) 108/54   Pulse: 79   Resp: 17   Temp:  98.2 F (36.8 C)  SpO2: 95%      General: Awake, no distress.  CV:  Good peripheral perfusion.  Heart regular rate and rhythm no audible murmurs Resp:  Normal effort.  Lungs are clear Abd:  No distention.  Soft and nontender Extremities: Left leg is more edematous than the right.  The knee is also swollen.  The whole leg is warm there is a hint of redness diffusely throughout the leg.  Knee hurts when he tries to bend it.   ED Results / Procedures / Treatments   Labs (all labs ordered are listed, but only abnormal results are displayed) Labs Reviewed - No data to display   EKG     RADIOLOGY Knee x-ray was reportedly done but I cannot find you the x-ray with the report.  I will repeat the  x-ray.  Additionally since the D-dimer is up and the whole leg is swollen and slightly red and warm I will do an ultrasound to rule out DVT.  PROCEDURES:  Critical Care performed:   Procedures oral consent obtained for tapping the knee risks could be causing infection and pain benefits of course would be diagnosing any kind of knee infection.  Knee prepped with Betadine and Betadine was allowed to dry lidocaine with epi was instilled along the medial border of the knee below the patella and a 18-gauge needle was inserted we aspirated in several locations trying to get some fluid but none was obtained.  It does not appear to be any reasonably sized knee effusion.  This would lead me to suspect that there is no infection in or around the knee and that the symptoms are all from the DVT.   MEDICATIONS ORDERED IN ED:  Medications  apixaban (ELIQUIS) tablet 10 mg (has no administration in time range)  lidocaine-EPINEPHrine (XYLOCAINE W/EPI) 2 %-1:200000 (PF) injection 20 mL (20 mLs Infiltration Given by Other 10/03/22 0517)     IMPRESSION / MDM / ASSESSMENT AND PLAN / ED COURSE  I reviewed the triage vital signs and the nursing notes. \ Differential diagnosis includes, but is not limited to, the elevated white count and sed rate make me quite concerned that there may be some infected knee effusion however this does not appear to be the case it looks like the symptoms are from his DVT.  He is not running a fever and has not run a fever the whole time.  His knee really is not warmer than the other 1 there is no redness.  There was just a faint hint of redness in the leg but that has since resolved.  Patient's presentation is most consistent with acute complicated illness / injury requiring diagnostic workup.  The patient is on the cardiac monitor to evaluate for evidence of arrhythmia and/or significant heart rate changes.  It has been seen  Clinical Course as of 10/03/22 0538  Thu Oct 03, 2022   0001 US Venous Img Lower Unilateral Left [PM]    Clinical Course User Index [PM] Nena Polio, MD     FINAL CLINICAL IMPRESSION(S) / ED DIAGNOSES   Final diagnoses:  Acute deep vein thrombosis (DVT) of left lower extremity, unspecified vein ([place])     Rx / DC Orders   ED Discharge Orders          Ordered    APIXABAN (ELIQUIS) VTE STARTER PACK ('10MG'$  AND '5MG'$ )        10/03/22 0535             Note:  This document was prepared using Dragon voice recognition software and may include unintentional dictation errors.   Nena Polio, MD 10/03/22 [phone]
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
## 2022-10-02 NOTE — ED Provider Notes (Incomplete)
[facility] Provider Note    Event Date/Time   First MD Initiated Contact with Patient 10/02/22 2321     (approximate)   History   Left knee pain   HPI  [name] is a 76 y.o. male with several days of pain in his left knee.  It hurts to flex it.  He was started on Mobic 2 days ago.  He was seen in the office today and had an knee injection with Kenalog and lidocaine but this has not helped.  Labs today in the office showed a white count of 19,000 and a sed rate of 79.  D-dimer was 3.  X-rays were done but I cannot find any report of the x-ray.      Physical Exam   Triage Vital Signs: ED Triage Vitals  Enc Vitals Group     BP 10/02/22 2230 (!) 115/97     Pulse Rate 10/02/22 2230 95     Resp 10/02/22 2230 16     Temp 10/02/22 2230 98.3 F (36.8 C)     Temp Source 10/02/22 2230 Oral     SpO2 10/02/22 2230 99 %     Weight 10/02/22 2229 195 lb (88.5 kg)     Height 10/02/22 2229 '5\' 9"'$  (1.753 m)     Head Circumference --      Peak Flow --      Pain Score --      Pain Loc --      Pain Edu? --      Excl. in [place]? --     Most recent vital signs: Vitals:   10/02/22 2230  BP: (!) 115/97  Pulse: 95  Resp: 16  Temp: 98.3 F (36.8 C)  SpO2: 99%     General: Awake, no distress.  CV:  Good peripheral perfusion.  Heart regular rate and rhythm no audible murmurs Resp:  Normal effort.  Lungs are clear Abd:  No distention.  Soft and nontender Extremities: Left leg is more edematous than the right.  The knee is also swollen.  The whole leg is warm there is a hint of redness diffusely throughout the leg.  Knee hurts when he tries to bend it.   ED Results / Procedures / Treatments   Labs (all labs ordered are listed, but only abnormal results are displayed) Labs Reviewed - No data to display   EKG     RADIOLOGY Knee x-ray was reportedly done but I cannot find you the x-ray with the report.  I will repeat the x-ray.  Additionally  since the D-dimer is up and the whole leg is swollen and slightly red and warm I will do an ultrasound to rule out DVT.  PROCEDURES:  Critical Care performed: {CriticalCareYesNo:19197::"Yes, see critical care procedure note(s)","No"}  Procedures   MEDICATIONS ORDERED IN ED: Medications - No data to display   IMPRESSION / MDM / [place] / ED COURSE  I reviewed the triage vital signs and the nursing notes.                              Differential diagnosis includes, but is not limited to, ***  Patient's presentation is most consistent with {EM COPA:27473}  {If the patient is on the monitor, remove the brackets and asterisks on the sentence below and remember to document it as a Procedure as well. Otherwise delete the sentence below:1} {**The patient is  on the cardiac monitor to evaluate for evidence of arrhythmia and/or significant heart rate changes.**} {Remember to include, when applicable, any/all of the following data: independent review of imaging independent review of labs (comment specifically on pertinent positives and negatives) review of specific prior hospitalizations, PCP/specialist notes, etc. discuss meds given and prescribed document any discussion with consultants (including hospitalists) any clinical decision tools you used and why (PECARN, NEXUS, etc.) did you consider admitting the patient? document social determinants of health affecting patient's care (homelessness, inability to follow up in a timely fashion, etc) document any pre-existing conditions increasing risk on current visit (e.g. diabetes and HTN increasing danger of high-risk chest pain/ACS) describes what meds you gave (especially parenteral) and why any other interventions?:1}     FINAL CLINICAL IMPRESSION(S) / ED DIAGNOSES   Final diagnoses:  None     Rx / DC Orders   ED Discharge Orders     None        Note:  This document was prepared using Dragon voice recognition  software and may include unintentional dictation errors.

## 2022-10-03 ENCOUNTER — Other Ambulatory Visit: Payer: Self-pay

## 2022-10-03 ENCOUNTER — Inpatient Hospital Stay: Payer: Medicare HMO | Admitting: Certified Registered"

## 2022-10-03 ENCOUNTER — Emergency Department: Payer: Medicare HMO

## 2022-10-03 ENCOUNTER — Inpatient Hospital Stay: Payer: Medicare HMO

## 2022-10-03 ENCOUNTER — Encounter
Admission: EM | Disposition: A | Discharge status: Skilled Nursing Facility | Payer: Self-pay | Source: Home / Self Care | Attending: Family Medicine

## 2022-10-03 DIAGNOSIS — I82452 Acute embolism and thrombosis of left peroneal vein: Secondary | ICD-10-CM | POA: Diagnosis present

## 2022-10-03 DIAGNOSIS — I110 Hypertensive heart disease with heart failure: Secondary | ICD-10-CM | POA: Diagnosis present

## 2022-10-03 DIAGNOSIS — I330 Acute and subacute infective endocarditis: Secondary | ICD-10-CM | POA: Diagnosis not present

## 2022-10-03 DIAGNOSIS — E785 Hyperlipidemia, unspecified: Secondary | ICD-10-CM | POA: Diagnosis present

## 2022-10-03 DIAGNOSIS — M009 Pyogenic arthritis, unspecified: Secondary | ICD-10-CM | POA: Diagnosis present

## 2022-10-03 DIAGNOSIS — M00062 Staphylococcal arthritis, left knee: Secondary | ICD-10-CM | POA: Diagnosis present

## 2022-10-03 DIAGNOSIS — E1065 Type 1 diabetes mellitus with hyperglycemia: Secondary | ICD-10-CM | POA: Diagnosis not present

## 2022-10-03 DIAGNOSIS — Z8551 Personal history of malignant neoplasm of bladder: Secondary | ICD-10-CM | POA: Diagnosis not present

## 2022-10-03 DIAGNOSIS — B9562 Methicillin resistant Staphylococcus aureus infection as the cause of diseases classified elsewhere: Secondary | ICD-10-CM | POA: Diagnosis present

## 2022-10-03 DIAGNOSIS — I5041 Acute combined systolic (congestive) and diastolic (congestive) heart failure: Secondary | ICD-10-CM | POA: Diagnosis not present

## 2022-10-03 DIAGNOSIS — N179 Acute kidney failure, unspecified: Secondary | ICD-10-CM | POA: Diagnosis present

## 2022-10-03 DIAGNOSIS — I2510 Atherosclerotic heart disease of native coronary artery without angina pectoris: Secondary | ICD-10-CM | POA: Diagnosis not present

## 2022-10-03 DIAGNOSIS — K219 Gastro-esophageal reflux disease without esophagitis: Secondary | ICD-10-CM | POA: Diagnosis not present

## 2022-10-03 DIAGNOSIS — I82409 Acute embolism and thrombosis of unspecified deep veins of unspecified lower extremity: Secondary | ICD-10-CM | POA: Diagnosis present

## 2022-10-03 DIAGNOSIS — I429 Cardiomyopathy, unspecified: Secondary | ICD-10-CM | POA: Diagnosis not present

## 2022-10-03 DIAGNOSIS — M1712 Unilateral primary osteoarthritis, left knee: Secondary | ICD-10-CM | POA: Diagnosis not present

## 2022-10-03 DIAGNOSIS — I25118 Atherosclerotic heart disease of native coronary artery with other forms of angina pectoris: Secondary | ICD-10-CM | POA: Diagnosis not present

## 2022-10-03 DIAGNOSIS — D75838 Other thrombocytosis: Secondary | ICD-10-CM | POA: Diagnosis present

## 2022-10-03 DIAGNOSIS — I82402 Acute embolism and thrombosis of unspecified deep veins of left lower extremity: Secondary | ICD-10-CM | POA: Diagnosis not present

## 2022-10-03 DIAGNOSIS — Z9889 Other specified postprocedural states: Secondary | ICD-10-CM | POA: Diagnosis not present

## 2022-10-03 DIAGNOSIS — I10 Essential (primary) hypertension: Secondary | ICD-10-CM | POA: Diagnosis not present

## 2022-10-03 DIAGNOSIS — M7122 Synovial cyst of popliteal space [Baker], left knee: Secondary | ICD-10-CM | POA: Diagnosis not present

## 2022-10-03 DIAGNOSIS — E103299 Type 1 diabetes mellitus with mild nonproliferative diabetic retinopathy without macular edema, unspecified eye: Secondary | ICD-10-CM | POA: Diagnosis present

## 2022-10-03 DIAGNOSIS — E875 Hyperkalemia: Secondary | ICD-10-CM | POA: Diagnosis not present

## 2022-10-03 DIAGNOSIS — R600 Localized edema: Secondary | ICD-10-CM | POA: Diagnosis not present

## 2022-10-03 DIAGNOSIS — Z87891 Personal history of nicotine dependence: Secondary | ICD-10-CM | POA: Diagnosis not present

## 2022-10-03 DIAGNOSIS — I5043 Acute on chronic combined systolic (congestive) and diastolic (congestive) heart failure: Secondary | ICD-10-CM | POA: Diagnosis not present

## 2022-10-03 DIAGNOSIS — D649 Anemia, unspecified: Secondary | ICD-10-CM | POA: Diagnosis present

## 2022-10-03 DIAGNOSIS — S83232A Complex tear of medial meniscus, current injury, left knee, initial encounter: Secondary | ICD-10-CM | POA: Diagnosis not present

## 2022-10-03 DIAGNOSIS — Z794 Long term (current) use of insulin: Secondary | ICD-10-CM | POA: Diagnosis not present

## 2022-10-03 DIAGNOSIS — I824Z2 Acute embolism and thrombosis of unspecified deep veins of left distal lower extremity: Secondary | ICD-10-CM | POA: Diagnosis not present

## 2022-10-03 DIAGNOSIS — Z79899 Other long term (current) drug therapy: Secondary | ICD-10-CM | POA: Diagnosis not present

## 2022-10-03 DIAGNOSIS — I420 Dilated cardiomyopathy: Secondary | ICD-10-CM | POA: Diagnosis not present

## 2022-10-03 DIAGNOSIS — E1051 Type 1 diabetes mellitus with diabetic peripheral angiopathy without gangrene: Secondary | ICD-10-CM | POA: Diagnosis present

## 2022-10-03 DIAGNOSIS — M23204 Derangement of unspecified medial meniscus due to old tear or injury, left knee: Secondary | ICD-10-CM | POA: Diagnosis not present

## 2022-10-03 DIAGNOSIS — M25462 Effusion, left knee: Secondary | ICD-10-CM | POA: Diagnosis not present

## 2022-10-03 DIAGNOSIS — Z7901 Long term (current) use of anticoagulants: Secondary | ICD-10-CM | POA: Diagnosis not present

## 2022-10-03 DIAGNOSIS — R7881 Bacteremia: Secondary | ICD-10-CM | POA: Diagnosis not present

## 2022-10-03 DIAGNOSIS — M01X62 Direct infection of left knee in infectious and parasitic diseases classified elsewhere: Secondary | ICD-10-CM | POA: Diagnosis not present

## 2022-10-03 DIAGNOSIS — Z85828 Personal history of other malignant neoplasm of skin: Secondary | ICD-10-CM | POA: Diagnosis not present

## 2022-10-03 DIAGNOSIS — T380X5A Adverse effect of glucocorticoids and synthetic analogues, initial encounter: Secondary | ICD-10-CM | POA: Diagnosis present

## 2022-10-03 DIAGNOSIS — I255 Ischemic cardiomyopathy: Secondary | ICD-10-CM | POA: Diagnosis present

## 2022-10-03 HISTORY — PX: KNEE ARTHROSCOPY: SHX127

## 2022-10-03 LAB — CBC WITH DIFFERENTIAL/PLATELET
Abs Immature Granulocytes: 0.22 10*3/uL — ABNORMAL HIGH (ref 0.00–0.07)
Basophils Absolute: 0.1 10*3/uL (ref 0.0–0.1)
Basophils Relative: 0 %
Eosinophils Absolute: 0.5 10*3/uL (ref 0.0–0.5)
Eosinophils Relative: 3 %
HCT: 32 % — ABNORMAL LOW (ref 39.0–52.0)
Hemoglobin: 10.5 g/dL — ABNORMAL LOW (ref 13.0–17.0)
Immature Granulocytes: 1 %
Lymphocytes Relative: 4 %
Lymphs Abs: 0.7 10*3/uL (ref 0.7–4.0)
MCH: 29.1 pg (ref 26.0–34.0)
MCHC: 32.8 g/dL (ref 30.0–36.0)
MCV: 88.6 fL (ref 80.0–100.0)
Monocytes Absolute: 1.8 10*3/uL — ABNORMAL HIGH (ref 0.1–1.0)
Monocytes Relative: 9 %
Neutro Abs: 17.1 10*3/uL — ABNORMAL HIGH (ref 1.7–7.7)
Neutrophils Relative %: 83 %
Platelets: 398 10*3/uL (ref 150–400)
RBC: 3.61 MIL/uL — ABNORMAL LOW (ref 4.22–5.81)
RDW: 13.6 % (ref 11.5–15.5)
WBC: 20.5 10*3/uL — ABNORMAL HIGH (ref 4.0–10.5)
nRBC: 0 % (ref 0.0–0.2)

## 2022-10-03 LAB — LACTIC ACID, PLASMA
Lactic Acid, Venous: 1.9 mmol/L (ref 0.5–1.9)
Lactic Acid, Venous: 1.6 mmol/L (ref 0.5–1.9)

## 2022-10-03 LAB — URINALYSIS, ROUTINE W REFLEX MICROSCOPIC
Bilirubin Urine: NEGATIVE
Glucose, UA: >=500 mg/dL — AB
Ketones, ur: 80 mg/dL — AB
Leukocytes,Ua: NEGATIVE
Nitrite: NEGATIVE
Protein, ur: 30 mg/dL — AB
Specific Gravity, Urine: 1.021 (ref 1.005–1.030)
pH: 5 (ref 5.0–8.0)

## 2022-10-03 LAB — COMPREHENSIVE METABOLIC PANEL
ALT: 18 U/L (ref 0–44)
AST: 19 U/L (ref 15–41)
Albumin: 2.7 g/dL — ABNORMAL LOW (ref 3.5–5.0)
Alkaline Phosphatase: 100 U/L (ref 38–126)
Anion gap: 15 (ref 5–15)
BUN: 59 mg/dL — ABNORMAL HIGH (ref 8–23)
CO2: 18 mmol/L — ABNORMAL LOW (ref 22–32)
Calcium: 9.1 mg/dL (ref 8.9–10.3)
Chloride: 103 mmol/L (ref 98–111)
Creatinine, Ser: 1.38 mg/dL — ABNORMAL HIGH (ref 0.61–1.24)
GFR, Estimated: 53 mL/min — ABNORMAL LOW (ref >60)
Glucose, Bld: 390 mg/dL — ABNORMAL HIGH (ref 70–99)
Potassium: 4.6 mmol/L (ref 3.5–5.1)
Sodium: 136 mmol/L (ref 135–145)
Total Bilirubin: 1.9 mg/dL — ABNORMAL HIGH (ref 0.3–1.2)
Total Protein: 6.6 g/dL (ref 6.5–8.1)

## 2022-10-03 LAB — CBG MONITORING, ED
Glucose-Capillary: 362 mg/dL — ABNORMAL HIGH (ref 70–99)
Glucose-Capillary: 357 mg/dL — ABNORMAL HIGH (ref 70–99)

## 2022-10-03 LAB — SEDIMENTATION RATE: Sed Rate: >140 mm/hr — ABNORMAL HIGH (ref 0–20)

## 2022-10-03 LAB — GLUCOSE, CAPILLARY
Glucose-Capillary: 303 mg/dL — ABNORMAL HIGH (ref 70–99)
Glucose-Capillary: 415 mg/dL — ABNORMAL HIGH (ref 70–99)

## 2022-10-03 SURGERY — ARTHROSCOPY, KNEE
Anesthesia: General | Site: Knee | Laterality: Left

## 2022-10-03 MED ORDER — DEXAMETHASONE SODIUM PHOSPHATE 10 MG/ML IJ SOLN
INTRAMUSCULAR | Status: DC | PRN
  Administered 2022-10-03: 4 mg via INTRAVENOUS

## 2022-10-03 MED ORDER — KETOROLAC TROMETHAMINE 15 MG/ML IJ SOLN
7.5000 mg | Freq: Four times a day (QID) | INTRAMUSCULAR | Status: AC
  Administered 2022-10-03 – 2022-10-04 (×4): 7.5 mg via INTRAVENOUS
  Filled 2022-10-03 (×4): qty 1

## 2022-10-03 MED ORDER — KETOROLAC TROMETHAMINE 15 MG/ML IJ SOLN: 15.0000 mg | Freq: Once | INTRAMUSCULAR | Status: AC

## 2022-10-03 MED ORDER — LOSARTAN POTASSIUM 50 MG PO TABS: 25.0000 mg | ORAL_TABLET | ORAL | Status: DC

## 2022-10-03 MED ORDER — LACTATED RINGERS IV SOLN: INTRAVENOUS | Status: DC | PRN

## 2022-10-03 MED ORDER — APIXABAN (ELIQUIS) VTE STARTER PACK (10MG AND 5MG): ORAL_TABLET | ORAL | 0 refills | Status: DC

## 2022-10-03 MED ORDER — DOCUSATE SODIUM 100 MG PO CAPS: 200.0000 mg | ORAL_CAPSULE | Freq: Two times a day (BID) | ORAL | Status: DC

## 2022-10-03 MED ORDER — ACETAMINOPHEN 650 MG RE SUPP: 650.0000 mg | Freq: Four times a day (QID) | RECTAL | Status: DC | PRN

## 2022-10-03 MED ORDER — FENTANYL CITRATE (PF) 100 MCG/2ML IJ SOLN
INTRAMUSCULAR | Status: AC
  Filled 2022-10-03: qty 2

## 2022-10-03 MED ORDER — LIDOCAINE-EPINEPHRINE (PF) 2 %-1:200000 IJ SOLN: 10.0000 mL | Freq: Once | INTRAMUSCULAR | Status: DC

## 2022-10-03 MED ORDER — ADULT MULTIVITAMIN W/MINERALS CH
1.0000 | ORAL_TABLET | Freq: Every day | ORAL | Status: DC
  Administered 2022-10-04 – 2022-10-14 (×11): 1 via ORAL
  Filled 2022-10-03 (×11): qty 1

## 2022-10-03 MED ORDER — LIDOCAINE-EPINEPHRINE (PF) 2 %-1:200000 IJ SOLN
20.0000 mL | Freq: Once | INTRAMUSCULAR | Status: AC
  Administered 2022-10-03: 20 mL
  Filled 2022-10-03 (×2): qty 20

## 2022-10-03 MED ORDER — OXYCODONE HCL 5 MG PO TABS
ORAL_TABLET | ORAL | Status: AC
  Filled 2022-10-03: qty 1

## 2022-10-03 MED ORDER — HYOSCYAMINE SULFATE 0.125 MG SL SUBL: 0.1250 mg | SUBLINGUAL_TABLET | SUBLINGUAL | Status: DC | PRN

## 2022-10-03 MED ORDER — DOCUSATE SODIUM 100 MG PO CAPS
200.0000 mg | ORAL_CAPSULE | Freq: Two times a day (BID) | ORAL | Status: DC
  Administered 2022-10-03 – 2022-10-13 (×21): 200 mg via ORAL
  Filled 2022-10-03 (×21): qty 2

## 2022-10-03 MED ORDER — BISACODYL 10 MG RE SUPP
10.0000 mg | Freq: Every day | RECTAL | Status: DC | PRN
  Filled 2022-10-03: qty 1

## 2022-10-03 MED ORDER — PROPOFOL 10 MG/ML IV BOLUS
INTRAVENOUS | Status: AC
  Filled 2022-10-03: qty 20

## 2022-10-03 MED ORDER — ONDANSETRON HCL 4 MG PO TABS: 4.0000 mg | ORAL_TABLET | Freq: Four times a day (QID) | ORAL | Status: DC | PRN

## 2022-10-03 MED ORDER — CARVEDILOL 3.125 MG PO TABS
6.2500 mg | ORAL_TABLET | Freq: Two times a day (BID) | ORAL | Status: DC
  Administered 2022-10-03 – 2022-10-14 (×20): 6.25 mg via ORAL
  Filled 2022-10-03: qty 2
  Filled 2022-10-03: qty 1
  Filled 2022-10-03 (×5): qty 2
  Filled 2022-10-03 (×2): qty 1
  Filled 2022-10-03 (×2): qty 2
  Filled 2022-10-03: qty 1
  Filled 2022-10-03: qty 2
  Filled 2022-10-03 (×2): qty 1
  Filled 2022-10-03 (×3): qty 2
  Filled 2022-10-03: qty 1
  Filled 2022-10-03: qty 2

## 2022-10-03 MED ORDER — DEXMEDETOMIDINE HCL IN NACL 80 MCG/20ML IV SOLN
INTRAVENOUS | Status: DC | PRN
  Administered 2022-10-03: 8 ug via INTRAVENOUS

## 2022-10-03 MED ORDER — DEXAMETHASONE SODIUM PHOSPHATE 10 MG/ML IJ SOLN
INTRAMUSCULAR | Status: AC
  Filled 2022-10-03: qty 1

## 2022-10-03 MED ORDER — TRANEXAMIC ACID-NACL 1000-0.7 MG/100ML-% IV SOLN
INTRAVENOUS | Status: AC
  Administered 2022-10-03: 1000 mg via INTRAVENOUS
  Filled 2022-10-03: qty 100

## 2022-10-03 MED ORDER — GLYCOPYRROLATE 0.2 MG/ML IJ SOLN
INTRAMUSCULAR | Status: AC
  Filled 2022-10-03: qty 1

## 2022-10-03 MED ORDER — FENTANYL CITRATE (PF) 100 MCG/2ML IJ SOLN
INTRAMUSCULAR | Status: DC | PRN
  Administered 2022-10-03 (×2): 50 ug via INTRAVENOUS

## 2022-10-03 MED ORDER — INSULIN ASPART 100 UNIT/ML IJ SOLN
INTRAMUSCULAR | Status: AC
  Filled 2022-10-03: qty 1

## 2022-10-03 MED ORDER — OXYCODONE HCL 5 MG PO TABS
5.0000 mg | ORAL_TABLET | Freq: Once | ORAL | Status: AC | PRN
  Administered 2022-10-03: 5 mg via ORAL

## 2022-10-03 MED ORDER — LACTATED RINGERS IV SOLN: INTRAVENOUS | Status: DC

## 2022-10-03 MED ORDER — ONDANSETRON HCL 4 MG/2ML IJ SOLN
INTRAMUSCULAR | Status: DC | PRN
  Administered 2022-10-03: 4 mg via INTRAVENOUS

## 2022-10-03 MED ORDER — SODIUM CHLORIDE 0.9 % IV SOLN
2.0000 g | INTRAVENOUS | Status: DC
  Administered 2022-10-03: 2 g via INTRAVENOUS
  Filled 2022-10-03 (×2): qty 20

## 2022-10-03 MED ORDER — ACETAMINOPHEN 500 MG PO TABS
1000.0000 mg | ORAL_TABLET | Freq: Four times a day (QID) | ORAL | Status: AC
  Administered 2022-10-03 – 2022-10-04 (×4): 1000 mg via ORAL
  Filled 2022-10-03 (×4): qty 2

## 2022-10-03 MED ORDER — KETOROLAC TROMETHAMINE 15 MG/ML IJ SOLN
INTRAMUSCULAR | Status: AC
  Administered 2022-10-03: 15 mg via INTRAVENOUS
  Filled 2022-10-03: qty 1

## 2022-10-03 MED ORDER — ONDANSETRON HCL 4 MG/2ML IJ SOLN: 4.0000 mg | Freq: Four times a day (QID) | INTRAMUSCULAR | Status: DC | PRN

## 2022-10-03 MED ORDER — ASPIRIN 81 MG PO TBEC
81.0000 mg | DELAYED_RELEASE_TABLET | Freq: Every day | ORAL | Status: DC
  Administered 2022-10-03 – 2022-10-07 (×5): 81 mg via ORAL
  Filled 2022-10-03 (×5): qty 1

## 2022-10-03 MED ORDER — INSULIN ASPART 100 UNIT/ML IJ SOLN: 0.0000 [IU] | Freq: Three times a day (TID) | INTRAMUSCULAR | Status: DC

## 2022-10-03 MED ORDER — TRANEXAMIC ACID-NACL 1000-0.7 MG/100ML-% IV SOLN: 1000.0000 mg | Freq: Once | INTRAVENOUS | Status: AC

## 2022-10-03 MED ORDER — FAMOTIDINE 20 MG PO TABS
20.0000 mg | ORAL_TABLET | Freq: Two times a day (BID) | ORAL | Status: DC
  Administered 2022-10-03 – 2022-10-14 (×22): 20 mg via ORAL
  Filled 2022-10-03 (×22): qty 1

## 2022-10-03 MED ORDER — INSULIN ASPART 100 UNIT/ML IJ SOLN
0.0000 [IU] | Freq: Three times a day (TID) | INTRAMUSCULAR | Status: DC
  Administered 2022-10-04 – 2022-10-05 (×3): 15 [IU] via SUBCUTANEOUS
  Filled 2022-10-03 (×3): qty 1

## 2022-10-03 MED ORDER — FLEET ENEMA 7-19 GM/118ML RE ENEM: 1.0000 | ENEMA | Freq: Once | RECTAL | Status: DC | PRN

## 2022-10-03 MED ORDER — MORPHINE SULFATE (PF) 2 MG/ML IV SOLN
2.0000 mg | INTRAVENOUS | Status: DC | PRN
  Administered 2022-10-03: 2 mg via INTRAVENOUS
  Filled 2022-10-03: qty 1

## 2022-10-03 MED ORDER — ATORVASTATIN CALCIUM 20 MG PO TABS
80.0000 mg | ORAL_TABLET | Freq: Every day | ORAL | Status: DC
  Administered 2022-10-03 – 2022-10-04 (×2): 80 mg via ORAL
  Filled 2022-10-03 (×2): qty 4

## 2022-10-03 MED ORDER — MAGNESIUM HYDROXIDE 400 MG/5ML PO SUSP
30.0000 mL | Freq: Every day | ORAL | Status: DC | PRN
  Administered 2022-10-06 – 2022-10-13 (×4): 30 mL via ORAL
  Filled 2022-10-03 (×5): qty 30

## 2022-10-03 MED ORDER — SODIUM CHLORIDE 0.9 % IV SOLN: INTRAVENOUS | Status: DC

## 2022-10-03 MED ORDER — LACTATED RINGERS IV BOLUS (SEPSIS)
1000.0000 mL | Freq: Once | INTRAVENOUS | Status: AC
  Administered 2022-10-03: 1000 mL via INTRAVENOUS

## 2022-10-03 MED ORDER — INSULIN ASPART 100 UNIT/ML IJ SOLN
6.0000 [IU] | Freq: Once | INTRAMUSCULAR | Status: AC
  Administered 2022-10-03: 6 [IU] via SUBCUTANEOUS
  Filled 2022-10-03: qty 1

## 2022-10-03 MED ORDER — MORPHINE SULFATE (PF) 2 MG/ML IV SOLN
2.0000 mg | INTRAVENOUS | Status: DC | PRN
  Administered 2022-10-10: 2 mg via INTRAVENOUS
  Filled 2022-10-03: qty 1

## 2022-10-03 MED ORDER — VANCOMYCIN HCL 2000 MG/400ML IV SOLN
2000.0000 mg | Freq: Once | INTRAVENOUS | Status: DC
  Filled 2022-10-03: qty 400

## 2022-10-03 MED ORDER — DIPHENHYDRAMINE HCL 12.5 MG/5ML PO ELIX: 12.5000 mg | ORAL_SOLUTION | ORAL | Status: DC | PRN

## 2022-10-03 MED ORDER — INSULIN ASPART 100 UNIT/ML IJ SOLN: 0.0000 [IU] | INTRAMUSCULAR | Status: DC

## 2022-10-03 MED ORDER — PROPOFOL 10 MG/ML IV BOLUS
INTRAVENOUS | Status: DC | PRN
  Administered 2022-10-03: 150 mg via INTRAVENOUS
  Administered 2022-10-03: 50 mg via INTRAVENOUS

## 2022-10-03 MED ORDER — ACETAMINOPHEN 325 MG PO TABS
650.0000 mg | ORAL_TABLET | Freq: Four times a day (QID) | ORAL | Status: DC | PRN
  Administered 2022-10-05: 650 mg via ORAL
  Filled 2022-10-03: qty 2

## 2022-10-03 MED ORDER — EPINEPHRINE PF 1 MG/ML IJ SOLN: INTRAMUSCULAR | Status: DC | PRN

## 2022-10-03 MED ORDER — PHENYLEPHRINE HCL (PRESSORS) 10 MG/ML IV SOLN
INTRAVENOUS | Status: DC | PRN
  Administered 2022-10-03: 80 ug via INTRAVENOUS

## 2022-10-03 MED ORDER — INSULIN ASPART 100 UNIT/ML IJ SOLN
INTRAMUSCULAR | Status: DC | PRN
  Administered 2022-10-03: 15 [IU] via SUBCUTANEOUS

## 2022-10-03 MED ORDER — SENNA 8.6 MG PO TABS
1.0000 | ORAL_TABLET | Freq: Two times a day (BID) | ORAL | Status: DC
  Administered 2022-10-03 – 2022-10-13 (×22): 8.6 mg via ORAL
  Filled 2022-10-03 (×22): qty 1

## 2022-10-03 MED ORDER — GLYCOPYRROLATE 0.2 MG/ML IJ SOLN
INTRAMUSCULAR | Status: DC | PRN
  Administered 2022-10-03: .2 mg via INTRAVENOUS

## 2022-10-03 MED ORDER — RINGERS IRRIGATION IR SOLN
Status: DC | PRN
  Administered 2022-10-03: 1

## 2022-10-03 MED ORDER — VANCOMYCIN HCL 1250 MG/250ML IV SOLN
1250.0000 mg | INTRAVENOUS | Status: DC
  Filled 2022-10-03: qty 250

## 2022-10-03 MED ORDER — LIDOCAINE HCL (PF) 2 % IJ SOLN
INTRAMUSCULAR | Status: AC
  Filled 2022-10-03: qty 5

## 2022-10-03 MED ORDER — OXYCODONE HCL 5 MG PO TABS
5.0000 mg | ORAL_TABLET | ORAL | Status: DC | PRN
  Administered 2022-10-03: 5 mg via ORAL
  Administered 2022-10-04: 10 mg via ORAL
  Administered 2022-10-04: 5 mg via ORAL
  Administered 2022-10-05 – 2022-10-07 (×5): 10 mg via ORAL
  Administered 2022-10-10: 5 mg via ORAL
  Filled 2022-10-03 (×4): qty 2
  Filled 2022-10-03: qty 1
  Filled 2022-10-03 (×5): qty 2

## 2022-10-03 MED ORDER — INSULIN PUMP
SUBCUTANEOUS | Status: DC
  Filled 2022-10-03: qty 1

## 2022-10-03 MED ORDER — 0.9 % SODIUM CHLORIDE (POUR BTL) OPTIME
TOPICAL | Status: DC | PRN
  Administered 2022-10-03: 500 mL

## 2022-10-03 MED ORDER — EPINEPHRINE PF 1 MG/ML IJ SOLN
INTRAMUSCULAR | Status: AC
  Filled 2022-10-03: qty 3

## 2022-10-03 MED ORDER — APIXABAN 5 MG PO TABS
10.0000 mg | ORAL_TABLET | Freq: Once | ORAL | Status: AC
  Administered 2022-10-03: 10 mg via ORAL
  Filled 2022-10-03: qty 2

## 2022-10-03 MED ORDER — ONDANSETRON HCL 4 MG/2ML IJ SOLN
INTRAMUSCULAR | Status: AC
  Filled 2022-10-03: qty 2

## 2022-10-03 MED ORDER — VITAMIN B-12 1000 MCG PO TABS
1000.0000 ug | ORAL_TABLET | ORAL | Status: DC
  Administered 2022-10-04 – 2022-10-14 (×6): 1000 ug via ORAL
  Filled 2022-10-03 (×8): qty 1

## 2022-10-03 MED ORDER — ENOXAPARIN SODIUM 40 MG/0.4ML IJ SOSY
40.0000 mg | PREFILLED_SYRINGE | INTRAMUSCULAR | Status: DC
  Administered 2022-10-04 – 2022-10-06 (×3): 40 mg via SUBCUTANEOUS
  Filled 2022-10-03 (×3): qty 0.4

## 2022-10-03 MED ORDER — METOCLOPRAMIDE HCL 5 MG PO TABS: 5.0000 mg | ORAL_TABLET | Freq: Three times a day (TID) | ORAL | Status: DC | PRN

## 2022-10-03 MED ORDER — LIDOCAINE HCL (PF) 2 % IJ SOLN
INTRAMUSCULAR | Status: DC | PRN
  Administered 2022-10-03: 50 mg

## 2022-10-03 MED ORDER — BUPIVACAINE-EPINEPHRINE (PF) 0.5% -1:200000 IJ SOLN
INTRAMUSCULAR | Status: DC | PRN
  Administered 2022-10-03: 20 mL via PERINEURAL

## 2022-10-03 MED ORDER — HYDROMORPHONE HCL 1 MG/ML IJ SOLN: 0.2500 mg | INTRAMUSCULAR | Status: DC | PRN

## 2022-10-03 MED ORDER — OXYCODONE HCL 5 MG/5ML PO SOLN: 5.0000 mg | Freq: Once | ORAL | Status: AC | PRN

## 2022-10-03 MED ORDER — METOCLOPRAMIDE HCL 5 MG/ML IJ SOLN: 5.0000 mg | Freq: Three times a day (TID) | INTRAMUSCULAR | Status: DC | PRN

## 2022-10-03 SURGICAL SUPPLY — 37 items
APL PRP STRL LF DISP 70% ISPRP (MISCELLANEOUS) ×1
BLADE FULL RADIUS 3.5 (BLADE) ×1 IMPLANT
BLADE SHAVER 4.5X7 STR FR (MISCELLANEOUS) ×1 IMPLANT
BNDG ELASTIC 6X5.8 VLCR STR LF (GAUZE/BANDAGES/DRESSINGS) ×1 IMPLANT
BNDG ESMARK 6X12 TAN STRL LF (GAUZE/BANDAGES/DRESSINGS) ×1 IMPLANT
CHLORAPREP W/TINT 26 (MISCELLANEOUS) ×1 IMPLANT
CUFF TOURN SGL QUICK 34 (TOURNIQUET CUFF) ×1
CUFF TRNQT CYL 34X4.125X (TOURNIQUET CUFF) IMPLANT
CUP MEDICINE 2OZ PLAST GRAD ST (MISCELLANEOUS) ×1 IMPLANT
DRAPE ARTHRO LIMB 89X125 STRL (DRAPES) ×1 IMPLANT
DRAPE IMP U-DRAPE 54X76 (DRAPES) ×1 IMPLANT
DRSG XEROFORM 1X8 (GAUZE/BANDAGES/DRESSINGS) IMPLANT
ELECT REM PT RETURN 9FT ADLT (ELECTROSURGICAL) ×1
ELECTRODE REM PT RTRN 9FT ADLT (ELECTROSURGICAL) ×1 IMPLANT
GAUZE SPONGE 4X4 12PLY STRL (GAUZE/BANDAGES/DRESSINGS) ×1 IMPLANT
GLOVE BIO SURGEON STRL SZ8 (GLOVE) ×2 IMPLANT
GLOVE BIOGEL M 7.0 STRL (GLOVE) ×2 IMPLANT
GLOVE SURG UNDER LTX SZ8 (GLOVE) ×1 IMPLANT
GOWN STRL REUS W/ TWL LRG LVL3 (GOWN DISPOSABLE) ×1 IMPLANT
GOWN STRL REUS W/ TWL XL LVL3 (GOWN DISPOSABLE) ×2 IMPLANT
GOWN STRL REUS W/TWL LRG LVL3 (GOWN DISPOSABLE) ×1
GOWN STRL REUS W/TWL XL LVL3 (GOWN DISPOSABLE) ×1
IV LACTATED RINGER IRRG 3000ML (IV SOLUTION) ×3
IV LR IRRIG 3000ML ARTHROMATIC (IV SOLUTION) ×1 IMPLANT
KIT TURNOVER KIT A (KITS) ×1 IMPLANT
MANIFOLD NEPTUNE II (INSTRUMENTS) ×2 IMPLANT
NDL HYPO 21X1.5 SAFETY (NEEDLE) ×1 IMPLANT
NEEDLE HYPO 21X1.5 SAFETY (NEEDLE) ×1 IMPLANT
PACK ARTHROSCOPY KNEE (MISCELLANEOUS) ×1 IMPLANT
SPONGE T-LAP 18X18 ~~LOC~~+RFID (SPONGE) ×1 IMPLANT
SUT PROLENE 4 0 PS 2 18 (SUTURE) ×1 IMPLANT
SUT TICRON COATED BLUE 2 0 30 (SUTURE) IMPLANT
SYR 30ML LL (SYRINGE) ×1 IMPLANT
SYR 50ML LL SCALE MARK (SYRINGE) ×1 IMPLANT
TUBING INFLOW SET DBFLO PUMP (TUBING) ×1 IMPLANT
WAND WEREWOLF FLOW 90D (MISCELLANEOUS) ×1 IMPLANT
WATER STERILE IRR 500ML POUR (IV SOLUTION) ×1 IMPLANT

## 2022-10-03 NOTE — ED Notes (Signed)
Called [facility] for transport back home. Will send unit

## 2022-10-03 NOTE — ED Notes (Signed)
Pt's visitor came to desk, stating pt was in excruciating pain and couldn't bear weight on LLE. Pt doesn't feel comfortable going home, Dr. Cinda Quest made aware

## 2022-10-03 NOTE — Assessment & Plan Note (Addendum)
Patient presented with worsening left leg pain and swelling and is unable to bear weight on his left leg Noted to have marked leukocytosis which may be related to systemic steroid administration MRI concerning for septic arthritis Status post irrigation debridement and partial meniscectomy of left septic knee on 11/23 -repeat arthroscopic irrigation debridement on 11/27 POD 6 Will need IV antibiotics/daptomycin for 6-week with a last day on January 03/2023 per ID.  PICC line placed

## 2022-10-03 NOTE — Transfer of Care (Signed)
Immediate Anesthesia Transfer of Care Note  Patient: [name]  Procedure(s) Performed: EXTENSIVE ARTHROSCOPIC DEBRIDMENT WITH PARTIAL MENISECTOMY (Left: Knee)  Patient Location: PACU  Anesthesia Type:General  Level of Consciousness: sedated  Airway & Oxygen Therapy: Patient Spontanous Breathing and Patient connected to face mask oxygen  Post-op Assessment: Report given to RN and Post -op Vital signs reviewed and stable  Post vital signs: Reviewed  Last Vitals:  Vitals Value Taken Time  BP 112/49 10/03/22 1240  Temp    Pulse 71 10/03/22 1241  Resp 14 10/03/22 1241  SpO2 100 % 10/03/22 1241  Vitals shown include unvalidated device data.  Last Pain:         Complications: No notable events documented.

## 2022-10-03 NOTE — Op Note (Signed)
10/03/2022  12:44 PM  Patient:   [name]  Pre-Op Diagnosis:   Septic arthritis, left knee.  Postoperative diagnosis:   Same with degenerative medial meniscus tear and significant degenerative joint disease, left knee.  Procedure:   Arthroscopic irrigation and debridement with extensive partial synovectomy and partial medial meniscectomy, left knee.  Surgeon:   Pascal Lux, MD  Anesthesia:   General LMA  Findings:   As above. There were grade 3-4 chondromalacial changes involving the medial compartment, grade 2 chondromalacial changes involving the patellofemoral compartment, and grade 1-2 chondromalacial changes involving the lateral compartment. There was extensive degenerative tearing of the medial meniscus with what appears to be an old posterior root tear. In addition, there was some degenerative tearing of the anterior horn of the lateral meniscus. The anterior posterior cruciate ligaments both were in satisfactory condition.  Complications:   None  EBL:   50 cc.  Total fluids:   300 cc of crystalloid.  Tourniquet time:   None  Drains:   Hemovac x 1  Closure:   4-0 Prolene interrupted sutures.  Brief clinical note:   The patient is a 76 year old male with a 7 to 10-day history of gradually worsening left knee pain. His symptoms progressed substantially over the past 24 hours after receiving a steroid injection at his primary care provider's office. He presented to the emergency room where his history, physical examination, and subsequent lab/radiographic work-up demonstrated findings consistent with a septic left knee. The patient presents at this time for an arthroscopic irrigation and debridement of the left knee.  Procedure:   The patient was brought into the operating room and lain in the supine position. After adequate general laryngeal mask anesthesia was obtained, the left lower extremity was prepped with ChloraPrep solution before being draped sterilely.  Preoperative antibiotics were administered. A timeout was performed to verify the appropriate surgical site before the expected portal sites were injected with 0.5% Sensorcaine with epinephrine.   The camera was placed in the anterolateral portal and instrumentation performed through the anteromedial portal. The knee was sequentially examined beginning in the suprapatellar pouch, then progressing to the patellofemoral space, the medial gutter and compartment, the notch, and finally the lateral compartment and gutter. The findings were as described above. Abundant reactive synovial tissues anteriorly were debrided using the full-radius resector in order to improve visualization.  In addition, the full-radius resector was used to debride to the degenerative the torn portion of the medial meniscus back to stable margins. The knee was irrigated thoroughly using 3 L of sterile saline solution followed by 3 L of antibiotic irrigation solution followed by another 3 L of sterile saline solution. Hemostasis was achieved during the case using the ArthroCare wand. Care was taken to be sure that the medial, lateral, and patellofemoral compartments all were thoroughly debrided and irrigated. This was assisted by switching the camera from the lateral to the medial portal so that the full-radius resector could be introduced in the lateral portal to be sure that the lateral gutter was adequately irrigated and debrided. The instruments were removed from the joint after suctioning the excess fluid.   A single limb to a large Hemovac drain was placed through the lateral portal with the assistance of a short cannula before the portal sites were closed using 4-0 Prolene interrupted sutures. A sterile bulky dressing was applied to the knee. The patient was then awakened, extubated, and returned to the recovery room in satisfactory condition after tolerating the procedure  well.
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
## 2022-10-03 NOTE — Consult Note (Signed)
CODE SEPSIS - PHARMACY COMMUNICATION  **Broad Spectrum Antibiotics should be administered within 1 hour of Sepsis diagnosis**  Time Code Sepsis Called/Page Received: 3837  Antibiotics Ordered: ceftriaxone, vancomycin  Time of 1st antibiotic administration: 0932  Additional action taken by pharmacy: contacted RN near end of 1- hour window. RN was just just recently alerted of code sepsis status for patient  If necessary, Name of Provider/Nurse Contacted: Vennie Homans, RN    Dorothe Pea ,PharmD, BCPS Clinical Pharmacist  10/03/2022  8:11 AM

## 2022-10-03 NOTE — Assessment & Plan Note (Addendum)
Most likely related to systemic steroid injection the patient received for left knee pain Hold insulin pump for now Continue insulin Semglee 28 units subcu daily and NovoLog 6 units 3 times daily with each meals Sliding scale coverage for glycemic control Maintain consistent carbohydrate diet

## 2022-10-03 NOTE — ED Provider Notes (Addendum)
Care assumed from Dr. Malinda, patient presented with several days of left knee pain.  Anti-inflammatory medications.  Primary care physician office today knee was injected with Kenalog and lidocaine.  Labs with leukocytosis and elevated ESR.  Ultrasound duplex with nonocclusive DVT.  Orthopedics consulted, discussed with Dr. Poggi recommended admission to the hospitalist and he will evaluate for possible septic arthritis.  Made NPO.  Leukocytosis and significantly elevated ESR.  Blood cultures added on.  Started on antibiotics to cover for septic arthritis.  Consulted the hospitalist for admission.  9:06 AM Orthopedics wanting MRI of the knee prior to going to the OR to further evaluate for possible septic arthritis.  Able to go to MRI at 1015.  Patient will go to the OR from the floor.  CRITICAL CARE Performed by: Shannon Mumma   Total critical care time: 45 minutes  Critical care time was exclusive of separately billable procedures and treating other patients.  Critical care was necessary to treat or prevent imminent or life-threatening deterioration.  Critical care was time spent personally by me on the following activities: development of treatment plan with patient and/or surrogate as well as nursing, discussions with consultants, evaluation of patient's response to treatment, examination of patient, obtaining history from patient or surrogate, ordering and performing treatments and interventions, ordering and review of laboratory studies, ordering and review of radiographic studies, pulse oximetry and re-evaluation of patient's condition.    Mumma, Shannon, MD 10/03/22 0756    Mumma, Shannon, MD 10/03/22 0908  

## 2022-10-03 NOTE — Anesthesia Postprocedure Evaluation (Signed)
Anesthesia Post Note  Patient: ADELARD SANON  Procedure(s) Performed: EXTENSIVE ARTHROSCOPIC DEBRIDMENT WITH PARTIAL MENISECTOMY (Left: Knee)  Patient location during evaluation: PACU Anesthesia Type: General Level of consciousness: awake and alert Pain management: pain level controlled Vital Signs Assessment: post-procedure vital signs reviewed and stable Respiratory status: spontaneous breathing, nonlabored ventilation, respiratory function stable and patient connected to nasal cannula oxygen Cardiovascular status: blood pressure returned to baseline and stable Postop Assessment: no apparent nausea or vomiting Anesthetic complications: no   No notable events documented.   Last Vitals:  Vitals:   10/03/22 1345 10/03/22 1514  BP: (!) 120/57 (!) 116/53  Pulse: 71 80  Resp: 12 16  Temp:  (!) 35.2 C  SpO2: 93% 96%    Last Pain:  Vitals:   10/03/22 1537  TempSrc:   PainSc: 6                  Ilene Qua

## 2022-10-03 NOTE — Consult Note (Signed)
Pharmacy Antibiotic Note  [name] is a 76 y.o. male admitted on 10/02/2022 with  septic arthritis .  Pharmacy has been consulted for vancomycin dosing.  Assessment: 76 yo M presents c/o unexplained left knee pain x 4 days. Pt received an intraarticular cortisone injection for this issue on 11/22. Knee is edematous and warm. Knee MRI shows moderate knee joint effusion with synovial thickening suggesting synovitis but no adjacent fluid collection or abscess. Patient is afebrile, VSS, with an elevated WBC count of 20.5. Pt currently in surgery for arthroscopic debridement. Patient has received a one-time dose of ceftriaxone 2 g IV and will receive a vancomycin 2 g IV load after surgery.  Vancomycin 1250 mg IV q24H Goal AUC 400-550  Est AUC: 458.5; Cmax: 30.6; Cmin: 11.7 SCr 1.38; IBW; Vd 0.72  Plan: Initiate vancomycin 1250 mg IV q24H Follow up culture results to assess for antibiotic optimization Monitor renal function to assess for any necessary antibiotic dosing changes  Height: '5\' 9"'$  (175.3 cm) Weight: 88.5 kg (195 lb) IBW/kg (Calculated) : 70.7  Temp (24hrs), Avg:98.8 F (37.1 C), Min:98.2 F (36.8 C), Max:99.9 F (37.7 C)  Recent Labs  Lab 10/03/22 0658 10/03/22 0906  WBC 20.5*  --   CREATININE 1.38*  --   LATICACIDVEN  --  1.9    Estimated Creatinine Clearance: 50.9 mL/min (A) (by C-G formula based on SCr of 1.38 mg/dL (H)).    No Known Allergies  Antimicrobials this admission: 11/23 ceftriaxone >> 11/23 vancomycin >>  Dose adjustments this admission: N/A  Microbiology results: 11/23 BCx: sent 11/23 Synovial fluid: sent  Thank you for allowing pharmacy to be a part of this patient's care.  Will M. Ouida Sills, PharmD PGY-1 Pharmacy Resident 10/03/2022 1:58 PM

## 2022-10-03 NOTE — Sepsis Progress Note (Signed)
eLink is following this Code Sepsis. °

## 2022-10-03 NOTE — Assessment & Plan Note (Addendum)
Patient has a baseline serum creatinine of 1.0 and today on admission it is 1.38 Unclear etiology for worsening renal function and may be related to recent NSAID use Now resolved.   Lab Results  Component Value Date   CREATININE 0.90 10/13/2022   CREATININE 0.93 10/12/2022   CREATININE 0.99 10/11/2022

## 2022-10-03 NOTE — Plan of Care (Signed)
  Problem: Education: Goal: Knowledge of General Education information will improve Description: Including pain rating scale, medication(s)/side effects and non-pharmacologic comfort measures Outcome: Progressing   Problem: Activity: Goal: Risk for activity intolerance will decrease Outcome: Progressing   
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
## 2022-10-03 NOTE — Discharge Instructions (Addendum)
You have a nonobstructing blood clot in your r left leg.  I will start you on blood thinners called Eliquis.  Its 10 mg twice a day for 7 days and then 5 mg twice a day.  You will need it for at least 3 months.  Please follow-up with your regular primary care doctor later on this week or early next week.  I would stop the aspirin temporarily until you have a chance to talk your regular doctor and see if you need to take both.  Sometimes taking both without a good reason will increase your risk of bleeding.  Please return for increasing swelling pain fever or redness.   Arthroscopic Knee Surgery   Post-Op Instructions   1. Bracing or crutches: A walker will be provided at the time of discharge from the surgery center.    2. Ice: You may be provided with a device [facility]) that allows you to ice the affected area effectively. Otherwise you can ice manually.    3. Driving:  Plan on not driving for at least three days, and potentially longer if you are restricted by swelling. Please note that you are advised NOT to drive while taking narcotic pain medications as you may be impaired and unsafe to drive.   4. Activity: Ankle pumps several times an hour while awake to prevent blood clots. Weight bearing: full weight is permitted. Use crutches if there is pain and limping. Bending and straightening the knee is unlimited. Elevate knee above heart level as much as possible for one week. Avoid standing more than 5 minutes (consecutively) for the first week. No exercise involving the knee until cleared by the surgeon or physical therapist.  Avoid long distance travel for 4 weeks.   5. Medications:  - You have been provided a prescription for narcotic pain medicine. After surgery, take 1-2 narcotic tablets every 4 hours if needed for severe pain.  - A prescription for anti-nausea medication will be provided in case the narcotic medicine causes nausea - take 1 tablet every 6 hours only if nauseated.  - Take  ibuprofen 600 mg every 6 hours with food to reduce post-operative knee swelling. DO NOT STOP IBUPROFEN POST-OP UNTIL INSTRUCTED TO DO SO at first post-op office visit (10-14 days after surgery).  -Use the Lovenox once daily for 2 weeks to prevent blood clots.  -Take tylenol 500 mg every 6 hours for pain.  May stop tylenol 3 days after surgery if you are having minimal pain.   If you are taking prescription medication for anxiety, depression, insomnia, muscle spasm, chronic pain, or for attention deficit disorder you are advised that you are at a higher risk of adverse effects with use of narcotics post-op, including narcotic addiction/dependence, depressed breathing, death. If you use non-prescribed substances: alcohol, marijuana, cocaine, heroin, methamphetamines, etc., you are at a higher risk of adverse effects with use of narcotics post-op, including narcotic addiction/dependence, depressed breathing, death. You are advised that taking > 50 morphine milligram equivalents (MME) of narcotic pain medication per day results in twice the risk of overdose or death. For your prescription provided: oxycodone 5 mg - taking more than 6 tablets per day. Be advised that we will prescribe narcotics short-term, for acute post-operative pain only - 1 week for minor operations such as knee arthroscopy for meniscus tear resection, and 3 weeks for major operations such as knee repair/reconstruction surgeries.   6. Bandages: The physical therapist should change the bandages at the first post-op appointment.  If needed, the dressing supplies have been provided to you.   7. Physical Therapy: 2 times per week for the first 4 weeks, then 1-2 times per week from weeks 4-8 post-op. Therapy typically starts on post operative Day 3 or 4. You have been provided an order for physical therapy. The therapist will provide home exercises.   8. Work: May return when able to tolerate standing for greater than 2 hours.   9. Post-Op  Appointments: Your first post-op appointment will be with Dr. [name] in approximately 2 weeks time.    If you find that they have not been scheduled please call the Orthopaedic Appointment front desk at [phone].

## 2022-10-03 NOTE — Assessment & Plan Note (Signed)
Continue carvedilol 

## 2022-10-03 NOTE — Assessment & Plan Note (Addendum)
Continue carvedilol, atorvastatin and aspirin.

## 2022-10-03 NOTE — Anesthesia Procedure Notes (Signed)
Procedure Name: LMA Insertion Date/Time: 10/03/2022 11:22 AM  Performed by: Rolla Plate, CRNAPre-anesthesia Checklist: Patient identified, Patient being monitored, Timeout performed, Emergency Drugs available and Suction available Patient Re-evaluated:Patient Re-evaluated prior to induction Oxygen Delivery Method: Circle system utilized Preoxygenation: Pre-oxygenation with 100% oxygen Induction Type: IV induction Ventilation: Mask ventilation without difficulty LMA: LMA inserted LMA Size: 4.0 Tube type: Oral Number of attempts: 1 Placement Confirmation: positive ETCO2 and breath sounds checked- equal and bilateral Tube secured with: Tape Dental Injury: Teeth and Oropharynx as per pre-operative assessment

## 2022-10-03 NOTE — Consult Note (Signed)
PHARMACY -  BRIEF ANTIBIOTIC NOTE   Pharmacy has received consult(s) for vancomycin from an ED provider.  The patient's profile has been reviewed for ht/wt/allergies/indication/available labs.    One time order(s) placed for  Vancomycin 2 gram   Further antibiotics/pharmacy consults should be ordered by admitting physician if indicated.                       Thank you, Dorothe Pea, PharmD, BCPS Clinical Pharmacist   10/03/2022  8:12 AM

## 2022-10-03 NOTE — Assessment & Plan Note (Addendum)
Patient noted to have nonocclusive thrombus seen within the left calf peroneal vein. continue apixaban
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
## 2022-10-04 ENCOUNTER — Encounter: Payer: Self-pay | Admitting: Surgery

## 2022-10-04 DIAGNOSIS — B9562 Methicillin resistant Staphylococcus aureus infection as the cause of diseases classified elsewhere: Secondary | ICD-10-CM | POA: Diagnosis not present

## 2022-10-04 DIAGNOSIS — E1065 Type 1 diabetes mellitus with hyperglycemia: Secondary | ICD-10-CM | POA: Diagnosis not present

## 2022-10-04 DIAGNOSIS — M00062 Staphylococcal arthritis, left knee: Secondary | ICD-10-CM | POA: Diagnosis not present

## 2022-10-04 DIAGNOSIS — R7881 Bacteremia: Secondary | ICD-10-CM | POA: Diagnosis present

## 2022-10-04 LAB — BLOOD CULTURE ID PANEL (REFLEXED) - BCID2

## 2022-10-04 LAB — GLUCOSE, CAPILLARY
Glucose-Capillary: 398 mg/dL — ABNORMAL HIGH (ref 70–99)
Glucose-Capillary: 510 mg/dL (ref 70–99)
Glucose-Capillary: 522 mg/dL (ref 70–99)
Glucose-Capillary: 481 mg/dL — ABNORMAL HIGH (ref 70–99)
Glucose-Capillary: 397 mg/dL — ABNORMAL HIGH (ref 70–99)
Glucose-Capillary: 403 mg/dL — ABNORMAL HIGH (ref 70–99)
Glucose-Capillary: 375 mg/dL — ABNORMAL HIGH (ref 70–99)

## 2022-10-04 LAB — BASIC METABOLIC PANEL
Anion gap: 16 — ABNORMAL HIGH (ref 5–15)
BUN: 72 mg/dL — ABNORMAL HIGH (ref 8–23)
CO2: 19 mmol/L — ABNORMAL LOW (ref 22–32)
Calcium: 8.9 mg/dL (ref 8.9–10.3)
Chloride: 102 mmol/L (ref 98–111)
Creatinine, Ser: 1.52 mg/dL — ABNORMAL HIGH (ref 0.61–1.24)
GFR, Estimated: 47 mL/min — ABNORMAL LOW (ref >60)
Glucose, Bld: 515 mg/dL (ref 70–99)
Potassium: 5.2 mmol/L — ABNORMAL HIGH (ref 3.5–5.1)
Sodium: 137 mmol/L (ref 135–145)

## 2022-10-04 LAB — CBC
HCT: 33.8 % — ABNORMAL LOW (ref 39.0–52.0)
Hemoglobin: 11.3 g/dL — ABNORMAL LOW (ref 13.0–17.0)
MCH: 29.6 pg (ref 26.0–34.0)
MCHC: 33.4 g/dL (ref 30.0–36.0)
MCV: 88.5 fL (ref 80.0–100.0)
Platelets: 470 10*3/uL — ABNORMAL HIGH (ref 150–400)
RBC: 3.82 MIL/uL — ABNORMAL LOW (ref 4.22–5.81)
RDW: 13.8 % (ref 11.5–15.5)
WBC: 23.9 10*3/uL — ABNORMAL HIGH (ref 4.0–10.5)
nRBC: 0 % (ref 0.0–0.2)

## 2022-10-04 LAB — HEMOGLOBIN A1C
Hgb A1c MFr Bld: 7.3 % — ABNORMAL HIGH (ref 4.8–5.6)
Mean Plasma Glucose: 163 mg/dL

## 2022-10-04 LAB — CK: Total CK: 82 U/L (ref 49–397)

## 2022-10-04 LAB — POTASSIUM: Potassium: 5.3 mmol/L — ABNORMAL HIGH (ref 3.5–5.1)

## 2022-10-04 MED ORDER — ATORVASTATIN CALCIUM 20 MG PO TABS
40.0000 mg | ORAL_TABLET | Freq: Every day | ORAL | Status: DC
  Administered 2022-10-05 – 2022-10-14 (×10): 40 mg via ORAL
  Filled 2022-10-04 (×10): qty 2

## 2022-10-04 MED ORDER — VANCOMYCIN HCL 1250 MG/250ML IV SOLN: 1250.0000 mg | INTRAVENOUS | Status: DC

## 2022-10-04 MED ORDER — SODIUM CHLORIDE 0.9 % IV SOLN
8.0000 mg/kg | Freq: Every day | INTRAVENOUS | Status: DC
  Administered 2022-10-05 – 2022-10-13 (×9): 700 mg via INTRAVENOUS
  Filled 2022-10-04 (×12): qty 14

## 2022-10-04 MED ORDER — ATORVASTATIN CALCIUM 20 MG PO TABS: 20.0000 mg | ORAL_TABLET | Freq: Every day | ORAL | Status: DC

## 2022-10-04 MED ORDER — SODIUM CHLORIDE 0.9 % IV SOLN: INTRAVENOUS | Status: DC

## 2022-10-04 MED ORDER — VANCOMYCIN HCL 2000 MG/400ML IV SOLN
2000.0000 mg | Freq: Once | INTRAVENOUS | Status: AC
  Administered 2022-10-04: 2000 mg via INTRAVENOUS
  Filled 2022-10-04: qty 400

## 2022-10-04 MED ORDER — SODIUM ZIRCONIUM CYCLOSILICATE 10 G PO PACK
10.0000 g | PACK | Freq: Once | ORAL | Status: AC
  Administered 2022-10-04: 10 g via ORAL
  Filled 2022-10-04: qty 1

## 2022-10-04 MED ORDER — INSULIN ASPART 100 UNIT/ML IJ SOLN
20.0000 [IU] | Freq: Once | INTRAMUSCULAR | Status: AC
  Administered 2022-10-04: 20 [IU] via SUBCUTANEOUS
  Filled 2022-10-04: qty 1

## 2022-10-04 MED ORDER — INSULIN ASPART 100 UNIT/ML IJ SOLN
4.0000 [IU] | Freq: Three times a day (TID) | INTRAMUSCULAR | Status: DC
  Administered 2022-10-04 – 2022-10-08 (×7): 4 [IU] via SUBCUTANEOUS
  Filled 2022-10-04 (×8): qty 1

## 2022-10-04 MED ORDER — INSULIN GLARGINE-YFGN 100 UNIT/ML ~~LOC~~ SOLN
20.0000 [IU] | Freq: Every day | SUBCUTANEOUS | Status: DC
  Administered 2022-10-04 – 2022-10-05 (×2): 20 [IU] via SUBCUTANEOUS
  Filled 2022-10-04 (×2): qty 0.2

## 2022-10-04 NOTE — Progress Notes (Signed)
PHARMACY - PHYSICIAN COMMUNICATION CRITICAL VALUE ALERT - BLOOD CULTURE IDENTIFICATION (BCID)  [name] is an 76 y.o. male who presented to [facility] on 10/02/2022 with a chief complaint of hyperglycemia.   Assessment:  MRSA in 4 of 4 bottles (include suspected source if known)  Name of physician (or Provider) Contacted: Sharion Settler, NP  Current antibiotics: Vanc, Ceftriaxone   Changes to prescribed antibiotics recommended:  Will d/c ceftriaxone and continue vanc  Results for orders placed or performed during the hospital encounter of 10/02/22  Blood Culture ID Panel (Reflexed) (Collected: 10/03/2022  9:06 AM)  Result Value Ref Range   Enterococcus faecalis NOT DETECTED NOT DETECTED   Enterococcus Faecium NOT DETECTED NOT DETECTED   Listeria monocytogenes NOT DETECTED NOT DETECTED   Staphylococcus species DETECTED (A) NOT DETECTED   Staphylococcus aureus (BCID) DETECTED (A) NOT DETECTED   Staphylococcus epidermidis NOT DETECTED NOT DETECTED   Staphylococcus lugdunensis NOT DETECTED NOT DETECTED   Streptococcus species NOT DETECTED NOT DETECTED   Streptococcus agalactiae NOT DETECTED NOT DETECTED   Streptococcus pneumoniae NOT DETECTED NOT DETECTED   Streptococcus pyogenes NOT DETECTED NOT DETECTED   A.calcoaceticus-baumannii NOT DETECTED NOT DETECTED   Bacteroides fragilis NOT DETECTED NOT DETECTED   Enterobacterales NOT DETECTED NOT DETECTED   Enterobacter cloacae complex NOT DETECTED NOT DETECTED   Escherichia coli NOT DETECTED NOT DETECTED   Klebsiella aerogenes NOT DETECTED NOT DETECTED   Klebsiella oxytoca NOT DETECTED NOT DETECTED   Klebsiella pneumoniae NOT DETECTED NOT DETECTED   Proteus species NOT DETECTED NOT DETECTED   Salmonella species NOT DETECTED NOT DETECTED   Serratia marcescens NOT DETECTED NOT DETECTED   Haemophilus influenzae NOT DETECTED NOT DETECTED   Neisseria meningitidis NOT DETECTED NOT DETECTED   Pseudomonas aeruginosa NOT  DETECTED NOT DETECTED   Stenotrophomonas maltophilia NOT DETECTED NOT DETECTED   Candida albicans NOT DETECTED NOT DETECTED   Candida auris NOT DETECTED NOT DETECTED   Candida glabrata NOT DETECTED NOT DETECTED   Candida krusei NOT DETECTED NOT DETECTED   Candida parapsilosis NOT DETECTED NOT DETECTED   Candida tropicalis NOT DETECTED NOT DETECTED   Cryptococcus neoformans/gattii NOT DETECTED NOT DETECTED   Meth resistant mecA/C and MREJ DETECTED (A) NOT DETECTED    [name] D 10/04/2022  2:01 AM

## 2022-10-04 NOTE — TOC Progression Note (Signed)
Transition of Care [facility]) - Progression Note    Patient Details  Name: BARTOLO MONTANYE MRN: [id number] Date of Birth: [birth year]  Transition of Care [facility]) CM/SW [place], RN Phone Number: 10/04/2022, 2:27 PM  Clinical Narrative:     Patient comes from home, PT To eval and provide recommendations The patient has an Insulin Pump at home [facility] To follow and assist with DC planning and needs  Expected Discharge Plan: [place] Barriers to Discharge: Continued Medical Work up  Expected Discharge Plan and Services Expected Discharge Plan: [place] arrangements for the past 2 months: Single Family Home                                       Social Determinants of Health (SDOH) Interventions    Readmission Risk Interventions     No data to display

## 2022-10-04 NOTE — Progress Notes (Signed)
Patients blood sugar 481. MD notified. MD stated to give scheduled 15 units per sliding scale.
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
## 2022-10-04 NOTE — Progress Notes (Addendum)
Pharmacy Antibiotic Note  [name] is a 76 y.o. male admitted on 10/02/2022 with  septic arthritis .  Pharmacy has been consulted for vancomycin dosing.  Assessment: 76 yo M presents c/o unexplained left knee pain x 4 days. Pt received an intraarticular cortisone injection for this issue on 11/22. Knee is edematous and warm. Knee MRI shows moderate knee joint effusion with synovial thickening suggesting synovitis but no adjacent fluid collection or abscess. Patient is afebrile, VSS, with an elevated WBC count of 20.5. Pt currently in surgery for arthroscopic debridement. Patient has received a one-time dose of ceftriaxone 2 g IV and will receive a vancomycin 2 g IV load after surgery.  11/24 @ 0745: *Loading Vancomycin '2000mg'$  IVPB loading dose ordered for 11/23 NO GIVEN. eMAR information verified with floor nurse. No vancomycin given in OR or prior to OR. Ceftriaxone 2gm x 1 dose given prior to surgery*  Plan: Vancomycin 2000 mg IVPB x 1 NOW, then Vancomycin 1250 mg IV q24H Goal AUC 400-550  Est AUC: 499.8; Cmax: 32.1; Cmin: 13.3 SCr 1.52; IBW; Vd 0.72 Will order random Vancomycin level prior to 2nd dose to confirm only 1 dose received and safe to continue with schedule vanco. Follow up culture results to assess for antibiotic optimization Monitor renal function to assess for any necessary antibiotic dosing changes  Height: '5\' 9"'$  (175.3 cm) Weight: 88.5 kg (195 lb) IBW/kg (Calculated) : 70.7  Temp (24hrs), Avg:97.9 F (36.6 C), Min:95.4 F (35.2 C), Max:99.9 F (37.7 C)  Recent Labs  Lab 10/03/22 0658 10/03/22 0906 10/03/22 1500 10/04/22 0459  WBC 20.5*  --   --  23.9*  CREATININE 1.38*  --   --  1.52*  LATICACIDVEN  --  1.9 1.6  --      Estimated Creatinine Clearance: 46.2 mL/min (A) (by C-G formula based on SCr of 1.52 mg/dL (H)).    No Known Allergies  Antimicrobials this admission: 11/23 ceftriaxone x 1 dose 11/24 vancomycin >>  Dose adjustments this  admission:N/A  Microbiology results: 11/23 BCx: MRSA 11/23 Synovial fluid: sent  Thank you for allowing pharmacy to be a part of this patient's care.  [name] PharmD, BCPS 10/04/2022 7:51 AM

## 2022-10-04 NOTE — Progress Notes (Signed)
       CROSS COVER NOTE  NAME: TRYSTYN DOLLEY MRN: [id number] DOB : August 04, 1946    Time of Service   [id number]  HPI/Events of Note   Critical result - serum glucose 515  Assessment and  Interventions   Assessment:  Plan: SQ novolog 20 units recheck cbg 4 hours       Kathlene Cote NP [facility]

## 2022-10-04 NOTE — Plan of Care (Signed)
  Problem: Activity: Goal: Risk for activity intolerance will decrease Outcome: Progressing   Problem: Nutrition: Goal: Adequate nutrition will be maintained Outcome: Progressing   Problem: Coping: Goal: Level of anxiety will decrease Outcome: Progressing   Problem: Education: Goal: Ability to describe self-care measures that may prevent or decrease complications (Diabetes Survival Skills Education) will improve Outcome: Progressing   Problem: Skin Integrity: Goal: Risk for impaired skin integrity will decrease Outcome: Progressing   Problem: Tissue Perfusion: Goal: Adequacy of tissue perfusion will improve Outcome: Progressing

## 2022-10-04 NOTE — Progress Notes (Signed)
PROGRESS NOTE    [name]  DGL:[id number] DOB: 12/14/1945 DOA: 10/02/2022 PCP: [name], MD   Brief Narrative:  This 76 years old male with PMH significant for type 1 diabetes on insulin pump, history of left leg DVT, coronary artery disease s/p stent angioplasty, GERD, history of bladder cancer, history of ureteral stricture s/p dilatation, prior history of MRSA infection presented in the ED for the evaluation of left knee pain which has been there for last 6 days.  Patient reports he has developed left knee pain 6 days ago which has progressively worsened resulting in an inability to bear weight or bend his left knee.  He denies any recent trauma or falls.  He was seen by primary care physician twice and was started on Mobic without any improvement and then he received lidocaine/Kenalog injection 1 day prior to his admission which also shows no improvement in his symptoms.  Patient had an MRI of left knee which shows moderate knee joint effusion with synovial thickening suggesting synovitis. He is also found to have complex tear of posterior horn of medial meniscus. Orthopedics was consulted and patient was taken to the OR for arthroscopy and washout of left knee.  Tolerated well.  Assessment & Plan:   Principal Problem:   Uncontrolled type 1 diabetes mellitus with hyperglycemia, with long-term current use of insulin ([name]) Active Problems:   Septic arthritis of knee, left ([name])   AKI (acute kidney injury) ([place])   DVT (deep venous thrombosis) ([name])   HYPERTENSION, BENIGN   CAD, NATIVE VESSEL  Septic arthritis of left knee: Patient presented for the evaluation of worsening left leg pain and swelling . He is unable to bear weight on left leg. He is noted to have marked leukocytosis which could be related to systemic steroid administration.   MRI concerning for septic arthritis and meniscal tear. Orthopedics is consulted. He underwent extensive arthroscopic debridement with  partial meniscectomy Empirically started on vancomycin and Rocephin. Follow-up results of synovial fluid analysis. Begin physical therapy for ambulation and gentle range of motion.  Uncontrolled type 1 diabetes mellitus with hyperglycemia: Most likely related to systemic steroid injection the patient has received for left knee pain Hold insulin pump for now.  Continue aggressive fluid resuscitation. Start Semglee 20 units daily with sliding scale coverage Carb modified diet.   Acute kidney injury : Baseline serum creatinine 1.0, presented in the ED with creatinine of 1.38 Unclear etiology of worsening renal functions but could be related to recent NSAID use. Continue aggressive IV fluid resuscitation.  Avoid nephrotoxic medications.  History of DVT: Patient noted to have nonocclusive thrombus seen within the left calf peroneal vein. Continue Eliquis.   Coronary artery disease, native vessel. Continue Coreg, Lipitor,  and aspirin.   Essential hypertension Continue Coreg     DVT prophylaxis: Eliquis Code Status: Full code Family Communication: Partner at bedside Disposition Plan:   Status is: Inpatient Remains inpatient appropriate because: Patient admitted for left knee swelling,  found to have septic arthritis requiring IV antibiotics.   Orthopedics is consulted,  Patient underwent repair of the meniscus tear.   Consultants:  Orthopedics  Procedures: Arthroscopy with debridement with partial meniscectomy Antimicrobials: Anti-infectives (From admission, onward)    Start     Dose/Rate Route Frequency Ordered Stop   10/05/22 0800  vancomycin (VANCOREADY) IVPB 1250 mg/250 mL        1,250 mg 166.7 mL/hr over 90 Minutes Intravenous Every 24 hours 10/04/22 0840     10/04/22  1600  vancomycin (VANCOREADY) IVPB 1250 mg/250 mL  Status:  Discontinued        1,250 mg 166.7 mL/hr over 90 Minutes Intravenous Every 24 hours 10/03/22 1359 10/04/22 0746   10/04/22 0800  vancomycin  (VANCOREADY) IVPB 2000 mg/400 mL        2,000 mg 200 mL/hr over 120 Minutes Intravenous  Once 10/04/22 0747 10/04/22 1025   10/03/22 0815  vancomycin (VANCOREADY) IVPB 2000 mg/400 mL  Status:  Discontinued        2,000 mg 200 mL/hr over 120 Minutes Intravenous  Once 10/03/22 0811 10/04/22 0746   10/03/22 0800  cefTRIAXone (ROCEPHIN) 2 g in sodium chloride 0.9 % 100 mL IVPB  Status:  Discontinued        2 g 200 mL/hr over 30 Minutes Intravenous Every 24 hours 10/03/22 0754 10/04/22 0159       Subjective: Patient was seen and examined at bedside.  Overnight events noted. Patient reports still having pain in the left knee but improving. Patient denies any fever. Patient is s/p arthroscopic debridement with partial meniscectomy POD # 1  Objective: Vitals:   10/03/22 2041 10/04/22 0119 10/04/22 0409 10/04/22 0801  BP: (!) 104/49 (!) 101/48 (!) 127/57 (!) 111/57  Pulse: 77 69 82 98  Resp: '16 18 18 16  '$ Temp: 98 F (36.7 C) 97.6 F (36.4 C) 97.6 F (36.4 C) 98.2 F (36.8 C)  TempSrc: Oral Oral Oral   SpO2: 95% 94% 99% 91%  Weight:      Height:        Intake/Output Summary (Last 24 hours) at 10/04/2022 1309 Last data filed at 10/04/2022 0706 Gross per 24 hour  Intake 2657.56 ml  Output 310 ml  Net 2347.56 ml   Filed Weights   10/02/22 2229  Weight: 88.5 kg    Examination:  General exam: Appears comfortable, not in any acute distress.  Deconditioned Respiratory system: CTA bilaterally, respiratory effort normal, RR 15 Cardiovascular system: S1 & S2 heard, regular rate and rhythm, no murmur. Gastrointestinal system: Abdomen is soft, non tender, non distended, BS+ Central nervous system: Alert and oriented x 3. No focal neurological deficits. Extremities: Left knee covered in dressing, tenderness noted,  decreased range of motion Skin: No rashes, lesions or ulcers Psychiatry: Judgement and insight appear normal. Mood & affect appropriate.     Data Reviewed: I have  personally reviewed following labs and imaging studies  CBC: Recent Labs  Lab 10/03/22 0658 10/04/22 0459  WBC 20.5* 23.9*  NEUTROABS 17.1*  --   HGB 10.5* 11.3*  HCT 32.0* 33.8*  MCV 88.6 88.5  PLT 398 938*   Basic Metabolic Panel: Recent Labs  Lab 10/03/22 0658 10/04/22 0459  NA 136 137  K 4.6 5.3*  5.2*  CL 103 102  CO2 18* 19*  GLUCOSE 390* 515*  BUN 59* 72*  CREATININE 1.38* 1.52*  CALCIUM 9.1 8.9   GFR: Estimated Creatinine Clearance: 46.2 mL/min (A) (by C-G formula based on SCr of 1.52 mg/dL (H)). Liver Function Tests: Recent Labs  Lab 10/03/22 0658  AST 19  ALT 18  ALKPHOS 100  BILITOT 1.9*  PROT 6.6  ALBUMIN 2.7*   No results for input(s): "LIPASE", "AMYLASE" in the last 168 hours. No results for input(s): "AMMONIA" in the last 168 hours. Coagulation Profile: No results for input(s): "INR", "PROTIME" in the last 168 hours. Cardiac Enzymes: No results for input(s): "CKTOTAL", "CKMB", "CKMBINDEX", "TROPONINI" in the last 168 hours. BNP (last 3 results)  No results for input(s): "PROBNP" in the last 8760 hours. HbA1C: Recent Labs    10/03/22 0654  HGBA1C 7.3*   CBG: Recent Labs  Lab 10/03/22 2124 10/04/22 0125 10/04/22 0753 10/04/22 0906 10/04/22 1119  GLUCAP 415* 398* 510* 522* 481*   Lipid Profile: No results for input(s): "CHOL", "HDL", "LDLCALC", "TRIG", "CHOLHDL", "LDLDIRECT" in the last 72 hours. Thyroid Function Tests: No results for input(s): "TSH", "T4TOTAL", "FREET4", "T3FREE", "THYROIDAB" in the last 72 hours. Anemia Panel: No results for input(s): "VITAMINB12", "FOLATE", "FERRITIN", "TIBC", "IRON", "RETICCTPCT" in the last 72 hours. Sepsis Labs: Recent Labs  Lab 10/03/22 0906 10/03/22 1500  LATICACIDVEN 1.9 1.6    Recent Results (from the past 240 hour(s))  Blood Culture (routine x 2)     Status: None (Preliminary result)   Collection Time: 10/03/22  9:06 AM   Specimen: BLOOD  Result Value Ref Range Status    Specimen Description BLOOD RIGHT [facility]  Final   Special Requests   Final    BOTTLES DRAWN AEROBIC AND ANAEROBIC Blood Culture adequate volume   Culture  Setup Time   Final    GRAM POSITIVE COCCI IN BOTH AEROBIC AND ANAEROBIC BOTTLES CRITICAL RESULT CALLED TO, READ BACK BY AND VERIFIED WITHVioleta Gelinas PHARMD AT 2297 10/04/2022 GAA Organism ID to follow Performed at [facility], [address] [place], [place] [address]    Culture GRAM POSITIVE COCCI  Final   Report Status PENDING  Incomplete  Blood Culture (routine x 2)     Status: None (Preliminary result)   Collection Time: 10/03/22  9:06 AM   Specimen: BLOOD  Result Value Ref Range Status   Specimen Description BLOOD RIGTH [facility]  Final   Special Requests   Final    BOTTLES DRAWN AEROBIC AND ANAEROBIC Blood Culture adequate volume   Culture  Setup Time   Final    GRAM POSITIVE COCCI IN BOTH AEROBIC AND ANAEROBIC BOTTLES GRAM STAIN REVIEWED-AGREE WITH RESULT CRITICAL VALUE NOTED.  VALUE IS CONSISTENT WITH PREVIOUSLY REPORTED AND CALLED VALUE. Performed at [facility], [address] [place], [place] [address]    Culture [facility] POSITIVE COCCI  Final   Report Status PENDING  Incomplete  Blood Culture ID Panel (Reflexed)     Status: Abnormal   Collection Time: 10/03/22  9:06 AM  Result Value Ref Range Status   Enterococcus faecalis NOT DETECTED NOT DETECTED Final   Enterococcus Faecium NOT DETECTED NOT DETECTED Final   Listeria monocytogenes NOT DETECTED NOT DETECTED Final   Staphylococcus species DETECTED (A) NOT DETECTED Final    Comment: CRITICAL RESULT CALLED TO, READ BACK BY AND VERIFIED WITH: Violeta Gelinas PHARMD AT 0150 10/04/2022 GAA    Staphylococcus aureus (BCID) DETECTED (A) NOT DETECTED Final    Comment: Methicillin (oxacillin)-resistant Staphylococcus aureus (MRSA). MRSA is predictably resistant to beta-lactam antibiotics (except ceftaroline). Preferred therapy is vancomycin unless clinically  contraindicated. Patient requires contact precautions if  hospitalized. CRITICAL RESULT CALLED TO, READ BACK BY AND VERIFIED WITH: Violeta Gelinas PHARMD AT 4081 10/04/2022 GAA    Staphylococcus epidermidis NOT DETECTED NOT DETECTED Final   Staphylococcus lugdunensis NOT DETECTED NOT DETECTED Final   Streptococcus species NOT DETECTED NOT DETECTED Final   Streptococcus agalactiae NOT DETECTED NOT DETECTED Final   Streptococcus pneumoniae NOT DETECTED NOT DETECTED Final   Streptococcus pyogenes NOT DETECTED NOT DETECTED Final   A.calcoaceticus-baumannii NOT DETECTED NOT DETECTED Final   Bacteroides fragilis NOT DETECTED NOT DETECTED Final   Enterobacterales NOT  DETECTED NOT DETECTED Final   Enterobacter cloacae complex NOT DETECTED NOT DETECTED Final   Escherichia coli NOT DETECTED NOT DETECTED Final   Klebsiella aerogenes NOT DETECTED NOT DETECTED Final   Klebsiella oxytoca NOT DETECTED NOT DETECTED Final   Klebsiella pneumoniae NOT DETECTED NOT DETECTED Final   Proteus species NOT DETECTED NOT DETECTED Final   Salmonella species NOT DETECTED NOT DETECTED Final   Serratia marcescens NOT DETECTED NOT DETECTED Final   Haemophilus influenzae NOT DETECTED NOT DETECTED Final   Neisseria meningitidis NOT DETECTED NOT DETECTED Final   Pseudomonas aeruginosa NOT DETECTED NOT DETECTED Final   Stenotrophomonas maltophilia NOT DETECTED NOT DETECTED Final   Candida albicans NOT DETECTED NOT DETECTED Final   Candida auris NOT DETECTED NOT DETECTED Final   Candida glabrata NOT DETECTED NOT DETECTED Final   Candida krusei NOT DETECTED NOT DETECTED Final   Candida parapsilosis NOT DETECTED NOT DETECTED Final   Candida tropicalis NOT DETECTED NOT DETECTED Final   Cryptococcus neoformans/gattii NOT DETECTED NOT DETECTED Final   Meth resistant mecA/C and MREJ DETECTED (A) NOT DETECTED Final    Comment: CRITICAL RESULT CALLED TO, READ BACK BY AND VERIFIED WITHVioleta Gelinas PHARMD AT 4196 10/04/2022  GAA Performed at [facility], [address] [place], [place] [address]   Aerobic/Anaerobic Culture w Gram Stain (surgical/deep wound)     Status: None (Preliminary result)   Collection Time: 10/03/22 11:45 AM   Specimen: PATH Cytology Misc. fluid; Body Fluid  Result Value Ref Range Status   Specimen Description   Final    KNEE LEFT Performed at [facility], [address], [place], [place] [address]    Special Requests   Final    NONE Performed at [facility], [address] [place], [place] [address]    Gram Stain   Final    ABUNDANT WBC PRESENT, PREDOMINANTLY PMN MODERATE GRAM POSITIVE COCCI IN PAIRS IN CLUSTERS Gram Stain Report Called to,Read Back By and Verified With:  Doretha Sou MCWHITE, RN 10/04/22 0100 A. LAFRANCE    Culture   Final    ABUNDANT STAPHYLOCOCCUS AUREUS CULTURE REINCUBATED FOR BETTER GROWTH Performed at [facility], [address], [place], [place] [address]    Report Status PENDING  Incomplete         Radiology Studies: MR KNEE LEFT WO CONTRAST  Result Date: 10/03/2022 CLINICAL DATA:  Septic arthritis suspected.  X-ray done. EXAM: MRI OF THE LEFT KNEE WITHOUT CONTRAST TECHNIQUE: Multiplanar, multisequence MR imaging of the left was performed. No intravenous contrast was administered. COMPARISON:  Radiographs dated October 02, 2022 FINDINGS: MENISCI Medial: Complex tear of the posterior horn of the medial meniscus with extrusion of the meniscal body. Lateral: Intact. LIGAMENTS Cruciates: ACL and PCL are intact. Collaterals: Medial bowing of medial collateral ligament secondary to extruded meniscus. Mild edema about the femoral attachment of the medial collateral ligament suggesting ligamentous sprain/partial-thickness tear. Lateral collateral ligament complex is intact. CARTILAGE Patellofemoral:  No chondral defect. Medial:  Moderate generalized articular cartilage thinning. Lateral:  No chondral defect. JOINT: Moderate  joint effusion with synovial thickening suggesting synovitis. POPLITEAL FOSSA: Popliteus tendon is intact. Baker's cyst measuring a proximally 2.0 x 1.1 x 3.0 cm. EXTENSOR MECHANISM: Intact quadriceps tendon. Intact patellar tendon. Intact lateral patellar retinaculum. Intact medial patellar retinaculum. Intact MPFL. BONES: No aggressive osseous lesion. No fracture or dislocation. Other: Subcutaneous soft tissue edema about the knee joint. No fluid collection or abscess. Mildly increased signal of the  distal quadriceps muscles suggesting reactive changes. No fluid collection or abscess. IMPRESSION: 1. Moderate knee joint effusion with synovial thickening suggesting synovitis. No adjacent fluid collection or abscess. Septic arthritis can not be excluded, clinical correlation is suggested. 2. Complex tear of the posterior horn of the medial meniscus with extrusion of the meniscal body. 3. Moderate medial tibiofemoral osteoarthritis. 4. Baker's cyst measuring 2.0 x 1.1 x 3.0 cm. 5. Mild edema of the distal quadriceps muscles suggesting reactive changes. 6. Marrow signal is within normal limits. No evidence of osteomyelitis or osseous lesion. Electronically Signed   By: Keane Police D.O.   On: 10/03/2022 11:21   US Venous Img Lower Unilateral Left  Result Date: 10/03/2022 CLINICAL DATA:  Left leg pain, swelling EXAM: LEFT LOWER EXTREMITY VENOUS DOPPLER ULTRASOUND TECHNIQUE: Gray-scale sonography with graded compression, as well as color Doppler and duplex ultrasound were performed to evaluate the lower extremity deep venous systems from the level of the common femoral vein and including the common femoral, femoral, profunda femoral, popliteal and calf veins including the posterior tibial, peroneal and gastrocnemius veins when visible. The superficial great saphenous vein was also interrogated. Spectral Doppler was utilized to evaluate flow at rest and with distal augmentation maneuvers in the common femoral,  femoral and popliteal veins. COMPARISON:  None Available. FINDINGS: Contralateral Common Femoral Vein: Respiratory phasicity is normal and symmetric with the symptomatic side. No evidence of thrombus. Normal compressibility. Common Femoral Vein: No evidence of thrombus. Normal compressibility, respiratory phasicity and response to augmentation. Saphenofemoral Junction: No evidence of thrombus. Normal compressibility and flow on color Doppler imaging. Profunda Femoral Vein: No evidence of thrombus. Normal compressibility and flow on color Doppler imaging. Femoral Vein: No evidence of thrombus. Normal compressibility, respiratory phasicity and response to augmentation. Popliteal Vein: No evidence of thrombus. Normal compressibility, respiratory phasicity and response to augmentation. Calf Veins: Nonocclusive clot seen within the left calf peroneal vein. Posterior tibial vein patent without thrombosis. Superficial Great Saphenous Vein: No evidence of thrombus. Normal compressibility. Venous Reflux:  None visualized Other Findings: 2 cystic structures within the popliteal fossa measuring up to 6.2 cm and 3.9 cm, likely Baker's cysts. IMPRESSION: Nonocclusive thrombus seen within the left calf peroneal vein. Otherwise venous structures patent within the left lower extremity. Left Baker's cysts. Electronically Signed   By: Rolm Baptise M.D.   On: 10/03/2022 01:15   DG Knee Complete 4 Views Left  Result Date: 10/03/2022 CLINICAL DATA:  Pain with swelling EXAM: LEFT KNEE - COMPLETE 4+ VIEW COMPARISON:  None. FINDINGS: Joint effusion is present. There is anterior knee soft tissue swelling. There is no acute fracture or dislocation. There are mild degenerative changes of the knee with joint space narrowing and minimal osteophyte formation. Lateral compartment chondrocalcinosis is present. IMPRESSION: 1. No acute fracture or dislocation. 2. Anterior knee soft tissue swelling and joint effusion. 3. Mild degenerative  changes of the knee. Electronically Signed   By: Ronney Asters M.D.   On: 10/03/2022 00:08    Scheduled Meds:  aspirin EC  81 mg Oral Daily   atorvastatin  80 mg Oral Daily   carvedilol  6.25 mg Oral BID WC   cyanocobalamin  1,000 mcg Oral QODAY   docusate sodium  200 mg Oral BID   enoxaparin (LOVENOX) injection  40 mg Subcutaneous Q24H   famotidine  20 mg Oral BID   insulin aspart  0-15 Units Subcutaneous TID WC   insulin aspart  4 Units Subcutaneous TID WC   insulin glargine-yfgn  20 Units Subcutaneous Daily   ketorolac  7.5 mg Intravenous Q6H   multivitamin with minerals  1 tablet Oral Daily   senna  1 tablet Oral BID   sodium zirconium cyclosilicate  10 g Oral Once   Continuous Infusions:  sodium chloride 75 mL/hr at 10/04/22 1200   [START ON 10/05/2022] vancomycin       LOS: 1 day    Time spent: 50 mins    Kimie Pidcock, MD [facility]   If 7PM-7AM, please contact night-coverage

## 2022-10-04 NOTE — Evaluation (Signed)
Physical Therapy Evaluation Patient Details Name: [name] MRN: [id number] DOB: [birth year] Today's Date: 10/04/2022  History of Present Illness  presented to ER and admitted secondary to progressive L knee pain, s/p arthroscopic debridement, partial menisectomy (10/03/22); hospital stay complicated by uncontrolled DM  Clinical Impression  Patient resting in bed upon arrival to room; alert and oriented, follows commands and agreeable to OOB efforts.  Recently received additional meds for hyperglycemia; trending downward, asymptomatic.  Denies pain at rest, but quickly elevates to 8/10 (FACES scale) with minimal movement of  LLE. Meds requested/received per RN during session.  L LE generally limited in strength (3-/5) and ROM (approx 15-50 degrees) due to pain; very guarded with any/all movement.  Currently requiring min assist for bed mobility; min/mod assist for sit/stand, basic transfers and gait (5') with RW.  Demonstrates 3-point, step to gait pattern; very heavy WBing bilat UEs (minimal WBing L LE). Difficulty managing walker and sequencing stepping pattern, requiring step-by-step verbal cuing to complete. Additional distance limited by pain. Will continue to assess/progress as appropriate in subsequent sessions, initiating stair training as appropriate.  Of note, patient required to negotiate 5-6 with single rail to enter main level of home. Would benefit from skilled PT to address above deficits and promote optimal return to PLOF.; recommend transition to STR upon discharge from acute hospitalization.  Patient/partner interested in home with max HH services if able to negotiate stairs, manage care in home        Recommendations for follow up therapy are one component of a multi-disciplinary discharge planning process, led by the attending physician.  Recommendations may be updated based on patient status, additional functional criteria and insurance authorization.  Follow Up  Recommendations Skilled nursing-short term rehab (<3 hours/day) (patient/partner interested in home with max HH services if able to negotiate stairs, manage care in home) Can patient physically be transported by private vehicle: Yes    Assistance Recommended at Discharge Frequent or constant Supervision/Assistance  Patient can return home with the following  A lot of help with walking and/or transfers;A lot of help with bathing/dressing/bathroom    Equipment Recommendations Rolling walker (2 wheels);BSC/3in1 (TTB)  Recommendations for Other Services       Functional Status Assessment Patient has had a recent decline in their functional status and demonstrates the ability to make significant improvements in function in a reasonable and predictable amount of time.     Precautions / Restrictions Precautions Precautions: Fall Restrictions Weight Bearing Restrictions: Yes LLE Weight Bearing: Weight bearing as tolerated      Mobility  Bed Mobility Overal bed mobility: Needs Assistance Bed Mobility: Supine to Sit     Supine to sit: Min assist     General bed mobility comments: assist for R LE management    Transfers Overall transfer level: Needs assistance Equipment used: Rolling walker (2 wheels) Transfers: Sit to/from Stand Sit to Stand: Min assist           General transfer comment: cuing for hand/foot placement    Ambulation/Gait Ambulation/Gait assistance: Min assist, Mod assist Gait Distance (Feet): 5 Feet Assistive device: Rolling walker (2 wheels)         General Gait Details: 3-point, step to gait pattern; very heavy WBing bilat UEs (minimal WBing L LE).  Difficulty managing walker and sequencing stepping pattern, requiring step-by-step verbal cuing to complete.  Additional distance limited by pain  Stairs            Wheelchair Mobility    Modified  Rankin (Stroke Patients Only)       Balance Overall balance assessment: Needs  assistance Sitting-balance support: No upper extremity supported, Feet supported Sitting balance-Leahy Scale: Good     Standing balance support: Bilateral upper extremity supported Standing balance-Leahy Scale: Fair                               Pertinent Vitals/Pain Pain Assessment Pain Assessment: Faces Faces Pain Scale: Hurts whole lot Pain Location: L knee Pain Descriptors / Indicators: Aching, Guarding, Grimacing Pain Intervention(s): Limited activity within patient's tolerance, Monitored during session, Patient requesting pain meds-RN notified, RN gave pain meds during session, Repositioned    Home Living Family/patient expects to be discharged to:: Private residence Living Arrangements: Spouse/significant other Available Help at Discharge: Family Type of Home: House Home Access: Stairs to enter Entrance Stairs-Rails: Right Entrance Stairs-Number of Steps: 6   Home Layout: Two level;Able to live on main level with bedroom/bathroom Home Equipment: Kasandra Knudsen - single point      Prior Function Prior Level of Function : Independent/Modified Independent             Mobility Comments: Indep with ADLs, household and community mobilization without assist device; endorses recent use of SPC due to progressive L knee pain.  Denies fall history.       Hand Dominance        Extremity/Trunk Assessment   Upper Extremity Assessment Upper Extremity Assessment: Overall WFL for tasks assessed    Lower Extremity Assessment Lower Extremity Assessment:  (L knee grossly 3-/5, generally limited by pain; tolerating approx 15-50 degrees of ROM.  Very guarded with any/all movement)       Communication   Communication: No difficulties  Cognition Arousal/Alertness: Awake/alert Behavior During Therapy: WFL for tasks assessed/performed Overall Cognitive Status: Within Functional Limits for tasks assessed                                          General  Comments      Exercises Other Exercises Other Exercises: Reviewed WBing precautions and HEP for use outside of therapy; patient voiced understanding. Other Exercises: Supine L LE therex, 1x10, active assist ROM: ankle pumps, quad sets, heel slides, hip abduct/adduct; seated heel slides.  Very guarded and limited in tolerable ROM   Assessment/Plan    PT Assessment Patient needs continued PT services  PT Problem List Decreased strength;Decreased range of motion;Decreased activity tolerance;Decreased balance;Decreased mobility;Decreased coordination;Decreased knowledge of use of DME;Decreased safety awareness;Decreased knowledge of precautions;Cardiopulmonary status limiting activity;Pain       PT Treatment Interventions DME instruction;Gait training;Stair training;Functional mobility training;Therapeutic activities;Therapeutic exercise;Balance training;Patient/family education    PT Goals (Current goals can be found in the Care Plan section)  Acute Rehab PT Goals Patient Stated Goal: to return home PT Goal Formulation: With patient/family Time For Goal Achievement: 10/18/22 Potential to Achieve Goals: Good    Frequency 7X/week     Co-evaluation               AM-PAC PT "6 Clicks" Mobility  Outcome Measure Help needed turning from your back to your side while in a flat bed without using bedrails?: None Help needed moving from lying on your back to sitting on the side of a flat bed without using bedrails?: A Little Help needed moving to and from a bed to  a chair (including a wheelchair)?: A Little Help needed standing up from a chair using your arms (e.g., wheelchair or bedside chair)?: A Little Help needed to walk in hospital room?: A Lot Help needed climbing 3-5 steps with a railing? : A Lot 6 Click Score: 17    End of Session Equipment Utilized During Treatment: Gait belt Activity Tolerance: Patient tolerated treatment well;Patient limited by pain Patient left: in  chair;with call bell/phone within reach;with chair alarm set;with family/visitor present Nurse Communication: Mobility status PT Visit Diagnosis: Muscle weakness (generalized) (M62.81);Difficulty in walking, not elsewhere classified (R26.2);Pain Pain - Right/Left: Left Pain - part of body: Knee    Time: 1452-1536 PT Time Calculation (min) (ACUTE ONLY): 44 min   Charges:   PT Evaluation $PT Eval Moderate Complexity: 1 Mod PT Treatments $Therapeutic Activity: 8-22 mins      Verneda Hollopeter H. Owens Shark, PT, DPT, NCS 10/04/22, 5:27 PM [phone]
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
## 2022-10-04 NOTE — Progress Notes (Signed)
Pharmacy Antibiotic Note  [name] is a 76 y.o. male admitted on 10/02/2022 with MRSA bacteremia related to septic L-knee.  Pharmacy has been consulted to transition Vancomycin to Daptomycin.  Noted AKI with SCr up to 1.52 (BL 1). Vancomycin loading dose of 2g given 11/24 @ 0830, will plan to transition to Daptomycin on 11/25. Add-on CK check today, then weekly. Will reduce Atorvastatin to 40 mg.   Plan: - Start Daptomycin 700 mg (8 mg/kg) every 24 hours - Add-on CK, then weekly CK checks - Reduce atorvastatin to 40 mg while on Daptomycin - Will monitor renal function closely for necessary dose adjustments  Height: '5\' 9"'$  (175.3 cm) Weight: 88.5 kg (195 lb) IBW/kg (Calculated) : 70.7  Temp (24hrs), Avg:97.9 F (36.6 C), Min:97.6 F (36.4 C), Max:98.2 F (36.8 C)  Recent Labs  Lab 10/03/22 0658 10/03/22 0906 10/03/22 1500 10/04/22 0459  WBC 20.5*  --   --  23.9*  CREATININE 1.38*  --   --  1.52*  LATICACIDVEN  --  1.9 1.6  --     Estimated Creatinine Clearance: 46.2 mL/min (A) (by C-G formula based on SCr of 1.52 mg/dL (H)).    No Known Allergies  Antimicrobials this admission: Ceftriaxone 11/23 x 1 Vancomycin 11/24 x 1 Daptomycin 11/25 >>  Dose adjustments this admission:   Microbiology results: 11/23 BCx >> 4/4 GPC (BCID MRSA) 11/24 L-knee (OR cx) >> abundant staph aureus  Thank you for allowing pharmacy to be a part of this patient's care.  Alycia Rossetti, PharmD, BCPS Infectious Diseases Clinical Pharmacist 10/04/2022 4:00 PM   **Pharmacist phone directory can now be found on [email or web address] ([email or web address]).  Listed under [place].

## 2022-10-04 NOTE — Plan of Care (Signed)

## 2022-10-04 NOTE — Inpatient Diabetes Management (Addendum)
Inpatient Diabetes Program Recommendations  AACE/ADA: New Consensus Statement on Inpatient Glycemic Control (2015)  Target Ranges:  Prepandial:   less than 140 mg/dL      Peak postprandial:   less than 180 mg/dL (1-2 hours)      Critically ill patients:  140 - 180 mg/dL    Latest Reference Range & Units 10/04/22 04:59  Sodium 135 - 145 mmol/L 137  Potassium 3.5 - 5.1 mmol/L 3.5 - 5.1 mmol/L 5.3 (H) 5.2 (H)  Chloride 98 - 111 mmol/L 102  CO2 22 - 32 mmol/L 19 (L)  Glucose 70 - 99 mg/dL 515 (HH)  BUN 8 - 23 mg/dL 72 (H)  Creatinine 0.61 - 1.24 mg/dL 1.52 (H)  Calcium 8.9 - 10.3 mg/dL 8.9  Anion gap 5 - 15  16 (H)  (HH): Data is critically high (H): Data is abnormally high (L): Data is abnormally low  Latest Reference Range & Units 10/03/22 12:02 10/03/22 12:44 10/03/22 16:45 10/03/22 21:24 10/04/22 01:25 10/04/22 07:53 10/04/22 09:06  Glucose-Capillary 70 - 99 mg/dL  4 mg Decadron _0  362 (H)  15 units Novolog  357 (H) 303 (H) 415 (H)  6 units Novolog  398 (H) 510 (HH)  20 units Novolog _1  522 (HH)  (HH): Data is critically high (H): Data is abnormally high   Admit with: Septic arthritis, left knee   History: Type 1 diabetes  Home DM Meds: Insulin Pump  Current Orders: Novolog Moderate Correction Scale/ SSI (0-15 units) TID AC    ENDO: Dr. Gabriel Carina with [facility] Last seen 09/24/2022 Tandem X2 insulin pump and DexCom G6 continuous glucose monitor (started in 09/2018) Basal Correction Carb ratio Target BG 12 AM 0.85 unit/hr 40 1:20 120 4 AM 0.75 unit/hr 40 1:13 120 7:30 AM 1.1 unit/hr 40 1:13 120 4 PM 1.2 unit/hr 40 1:18 120  24-hr basal = 25 units Duration of insulin = 5 hrs    Addendum 10am--Met w/ pt and partner at bedside.  Pt A&O and able to answer all questions.  Reviewed CBG of 510 this AM with pt.  Dr. Dwyane Dee was in the room when I arrived and we discussed pt's home insulin pump settings and starting Semglee 20 units Daily + Novolog 4 units TID for  meal coverage and leaving pt off his home insulin pump for now--Pt agreeable to this plan.  Pt gets about 25 units total insulin on pump per 24 hour period (see pump settings above from last ENDO visit on 09/24/22).  Pt to start the Semglee this AM as soon as it arrives from pharmacy and to start Novolog meal coverage with lunch--RN aware of new orders.  Pt has insulin Pump and Dexcom sensor at bedside in his bag.  Has all the supplies to restart insulin pump when he goes home--Pt Ok with [place] giving him his insulin for now off pump.  Discussed with pt that will be giving him Semglee insulin as a substitute for the basal rates on his pump--explained that Semglee lasts 24 hours and that pt will need to suspend the basal rates on his pump (if needed) until the Semglee is out of his system (for example, if Semglee given at 12pm, pt will need to restart his basal rates on his pump around 11am to 12pm the next day) to avoid the overlap of his basal rates and the [place] on board.  Pt told me he will likely be here through Monday.    --Will follow patient during hospitalization--  Suleyma Wafer  [place] Dell RN, MSN, [place] Diabetes Coordinator Inpatient Glycemic Control Team Team Pager: [phone] (8a-5p)

## 2022-10-05 ENCOUNTER — Inpatient Hospital Stay
Admit: 2022-10-05 | Discharge: 2022-10-05 | Disposition: A | Discharge status: Home / Self Care | Payer: Medicare HMO | Attending: Infectious Diseases | Admitting: Infectious Diseases

## 2022-10-05 DIAGNOSIS — E1065 Type 1 diabetes mellitus with hyperglycemia: Secondary | ICD-10-CM | POA: Diagnosis not present

## 2022-10-05 LAB — COMPREHENSIVE METABOLIC PANEL
ALT: 13 U/L (ref 0–44)
AST: 14 U/L — ABNORMAL LOW (ref 15–41)
Albumin: 2.2 g/dL — ABNORMAL LOW (ref 3.5–5.0)
Alkaline Phosphatase: 100 U/L (ref 38–126)
Anion gap: 10 (ref 5–15)
BUN: 76 mg/dL — ABNORMAL HIGH (ref 8–23)
CO2: 21 mmol/L — ABNORMAL LOW (ref 22–32)
Calcium: 8.5 mg/dL — ABNORMAL LOW (ref 8.9–10.3)
Chloride: 103 mmol/L (ref 98–111)
Creatinine, Ser: 1.67 mg/dL — ABNORMAL HIGH (ref 0.61–1.24)
GFR, Estimated: 42 mL/min — ABNORMAL LOW (ref >60)
Glucose, Bld: 369 mg/dL — ABNORMAL HIGH (ref 70–99)
Potassium: 4.8 mmol/L (ref 3.5–5.1)
Sodium: 134 mmol/L — ABNORMAL LOW (ref 135–145)
Total Bilirubin: 1.1 mg/dL (ref 0.3–1.2)
Total Protein: 5.7 g/dL — ABNORMAL LOW (ref 6.5–8.1)

## 2022-10-05 LAB — CBC
HCT: 31.6 % — ABNORMAL LOW (ref 39.0–52.0)
Hemoglobin: 10.8 g/dL — ABNORMAL LOW (ref 13.0–17.0)
MCH: 29.9 pg (ref 26.0–34.0)
MCHC: 34.2 g/dL (ref 30.0–36.0)
MCV: 87.5 fL (ref 80.0–100.0)
Platelets: 468 10*3/uL — ABNORMAL HIGH (ref 150–400)
RBC: 3.61 MIL/uL — ABNORMAL LOW (ref 4.22–5.81)
RDW: 13.9 % (ref 11.5–15.5)
WBC: 21.7 10*3/uL — ABNORMAL HIGH (ref 4.0–10.5)
nRBC: 0 % (ref 0.0–0.2)

## 2022-10-05 LAB — GLUCOSE, CAPILLARY
Glucose-Capillary: 385 mg/dL — ABNORMAL HIGH (ref 70–99)
Glucose-Capillary: 379 mg/dL — ABNORMAL HIGH (ref 70–99)
Glucose-Capillary: 250 mg/dL — ABNORMAL HIGH (ref 70–99)
Glucose-Capillary: 186 mg/dL — ABNORMAL HIGH (ref 70–99)

## 2022-10-05 LAB — ECHOCARDIOGRAM COMPLETE
AR max vel: 2.25 cm2
AV Peak grad: 5.7 mmHg
Ao pk vel: 1.19 m/s
Area-P 1/2: 4.26 cm2
Calc EF: 46.3 %
Height: 69 in
S' Lateral: 4.6 cm
Single Plane A2C EF: 48.9 %
Single Plane A4C EF: 39.5 %
Weight: 3120 oz

## 2022-10-05 LAB — MAGNESIUM: Magnesium: 2.2 mg/dL (ref 1.7–2.4)

## 2022-10-05 LAB — PHOSPHORUS: Phosphorus: 3 mg/dL (ref 2.5–4.6)

## 2022-10-05 LAB — CK: Total CK: 49 U/L (ref 49–397)

## 2022-10-05 MED ORDER — OXYCODONE HCL 5 MG PO TABS: 5.0000 mg | ORAL_TABLET | ORAL | 0 refills | Status: DC | PRN

## 2022-10-05 MED ORDER — PERFLUTREN LIPID MICROSPHERE
1.0000 mL | INTRAVENOUS | Status: AC | PRN
  Administered 2022-10-05: 4 mL via INTRAVENOUS

## 2022-10-05 MED ORDER — PHENOL 1.4 % MT LIQD
1.0000 | OROMUCOSAL | Status: DC | PRN
  Administered 2022-10-05: 1 via OROMUCOSAL
  Filled 2022-10-05: qty 177

## 2022-10-05 MED ORDER — INSULIN ASPART 100 UNIT/ML IJ SOLN
0.0000 [IU] | Freq: Three times a day (TID) | INTRAMUSCULAR | Status: DC
  Administered 2022-10-05: 7 [IU] via SUBCUTANEOUS
  Administered 2022-10-06: 11 [IU] via SUBCUTANEOUS
  Administered 2022-10-06: 7 [IU] via SUBCUTANEOUS
  Administered 2022-10-08: 15 [IU] via SUBCUTANEOUS
  Administered 2022-10-08: 5 [IU] via SUBCUTANEOUS
  Administered 2022-10-08: 15 [IU] via SUBCUTANEOUS
  Administered 2022-10-09: 11 [IU] via SUBCUTANEOUS
  Administered 2022-10-09 (×2): 20 [IU] via SUBCUTANEOUS
  Administered 2022-10-10: 7 [IU] via SUBCUTANEOUS
  Administered 2022-10-10 – 2022-10-13 (×3): 4 [IU] via SUBCUTANEOUS
  Administered 2022-10-13: 7 [IU] via SUBCUTANEOUS
  Administered 2022-10-14: 15 [IU] via SUBCUTANEOUS
  Administered 2022-10-14: 7 [IU] via SUBCUTANEOUS
  Filled 2022-10-05 (×18): qty 1

## 2022-10-05 MED ORDER — INSULIN GLARGINE-YFGN 100 UNIT/ML ~~LOC~~ SOLN
28.0000 [IU] | Freq: Every day | SUBCUTANEOUS | Status: DC
  Administered 2022-10-06 – 2022-10-14 (×6): 28 [IU] via SUBCUTANEOUS
  Filled 2022-10-05 (×9): qty 0.28

## 2022-10-05 MED ORDER — ENOXAPARIN SODIUM 40 MG/0.4ML IJ SOSY: 40.0000 mg | PREFILLED_SYRINGE | INTRAMUSCULAR | 0 refills | Status: DC

## 2022-10-05 NOTE — Progress Notes (Signed)
Physical Therapy Treatment Patient Details Name: [name] MRN: [id number] DOB: 1946/01/17 Today's Date: 10/05/2022   History of Present Illness presented to ER and admitted secondary to progressive L knee pain, s/p arthroscopic debridement, partial menisectomy (10/03/22); hospital stay complicated by uncontrolled DM    PT Comments    PT session started at conclusion of OT session. Pt was finished on toilet after BM and agreeable to ambulation. Altered cognition throughout session. Author questions if due to medications. Clarified with partner/caregiver that he is not at baseline cognitively. Pt was cooperative and willing to participate however  at high fall risk. He required assistance throughout session for safety + vcs for improved technique and sequencing. Discussed with pt/caregiver, plan going forward. Pt's partner will not be available to assist 24/7. Both are agreeable to rehab at DC. He will greatly benefit from SNF to maximize independence while assisting pt to PLOF.    Recommendations for follow up therapy are one component of a multi-disciplinary discharge planning process, led by the attending physician.  Recommendations may be updated based on patient status, additional functional criteria and insurance authorization.  Follow Up Recommendations  Skilled nursing-short term rehab (<3 hours/day)     Assistance Recommended at Discharge Frequent or constant Supervision/Assistance  Patient can return home with the following A little help with walking and/or transfers;A lot of help with bathing/dressing/bathroom;Two people to help with bathing/dressing/bathroom;Assistance with cooking/housework;Direct supervision/assist for medications management;Direct supervision/assist for financial management;Assist for transportation;Help with stairs or ramp for entrance   Equipment Recommendations  Other (comment) (defer to next level of care)       Precautions / Restrictions  Precautions Precautions: Fall Restrictions Weight Bearing Restrictions: Yes LLE Weight Bearing: Weight bearing as tolerated     Mobility  Bed Mobility  General bed mobility comments: pt was on toilet at beginning of session    Transfers Overall transfer level: Needs assistance Equipment used: Rolling walker (2 wheels) Transfers: Sit to/from Stand Sit to Stand: Min assist   Ambulation/Gait Ambulation/Gait assistance: Min assist, Mod assist Gait Distance (Feet): 50 Feet Assistive device: Rolling walker (2 wheels) Gait Pattern/deviations: Antalgic, Step-to pattern       General Gait Details: pt has inconsistent gait pattern throughout. tends to perform some NWB then some PWB and then progressed to some WBAT. pt c/o pain throughout and distance was limited by pain. cognition is poor throughout which greatly impacts session progression. Discussed care with pt and pt's caregiver. Now leaning and agreeable to STR at DC    Balance Overall balance assessment: Needs assistance Sitting-balance support: No upper extremity supported, Feet supported Sitting balance-Leahy Scale: Good     Standing balance support: Bilateral upper extremity supported, Reliant on assistive device for balance, During functional activity Standing balance-Leahy Scale: Fair       Cognition Arousal/Alertness: Awake/alert Behavior During Therapy: WFL for tasks assessed/performed Overall Cognitive Status: Impaired/Different from baseline    General Comments: Per pt's partner/caregiver " he's cognition is much worse than usual." Question if due to medications.           General Comments General comments (skin integrity, edema, etc.): pt is extremely high fall risk due to poor insight of deficits and poor overall safety awareness. pain does limit pt but overall poor cognition is most limiting      Pertinent Vitals/Pain Pain Assessment Pain Assessment: 0-10 Pain Score: 4  Pain Location: L knee Pain  Descriptors / Indicators: Aching, Guarding, Discomfort Pain Intervention(s): Limited activity within patient's tolerance, Monitored during  session, Premedicated before session, Repositioned    Home Living Family/patient expects to be discharged to:: Private residence Living Arrangements: Spouse/significant other Available Help at Discharge: Family;Available PRN/intermittently Type of Home: House Home Access: Stairs to enter Entrance Stairs-Rails: Right Entrance Stairs-Number of Steps: 6   Home Layout: Two level;Able to live on main level with bedroom/bathroom Home Equipment: Cane - single point          PT Goals (current goals can now be found in the care plan section) Acute Rehab PT Goals Patient Stated Goal: get better as fast as I can so I can get back home Progress towards PT goals: Progressing toward goals    Frequency    7X/week      PT Plan Current plan remains appropriate       AM-PAC PT "6 Clicks" Mobility   Outcome Measure  Help needed turning from your back to your side while in a flat bed without using bedrails?: None Help needed moving from lying on your back to sitting on the side of a flat bed without using bedrails?: A Little Help needed moving to and from a bed to a chair (including a wheelchair)?: A Little Help needed standing up from a chair using your arms (e.g., wheelchair or bedside chair)?: A Little Help needed to walk in hospital room?: A Lot Help needed climbing 3-5 steps with a railing? : A Lot 6 Click Score: 17    End of Session   Activity Tolerance: Patient limited by pain;Patient limited by fatigue Patient left: in chair;with call bell/phone within reach;with chair alarm set;with family/visitor present Nurse Communication: Mobility status PT Visit Diagnosis: Muscle weakness (generalized) (M62.81);Difficulty in walking, not elsewhere classified (R26.2);Pain Pain - Right/Left: Left Pain - part of body: Knee     Time: 1040-1058 PT  Time Calculation (min) (ACUTE ONLY): 18 min  Charges:  $Gait Training: 8-22 mins          Julaine Fusi PTA 10/05/22, 2:24 PM

## 2022-10-05 NOTE — TOC Progression Note (Signed)
Transition of Care [facility]) - Progression Note    Patient Details  Name: [name] MRN: [id number] Date of Birth: Apr 22, 1946  Transition of Care [facility]) CM/SW [place], [place] Phone Number: 10/05/2022, 1:04 PM  Clinical Narrative:     Pt and partner now agreeable to SNF placement at dc, pt faxed out to local SNFs.   Expected Discharge Plan: [place] Barriers to Discharge: Continued Medical Work up  Expected Discharge Plan and Services Expected Discharge Plan: [place] arrangements for the past 2 months: Single Family Home                                       Social Determinants of Health (SDOH) Interventions    Readmission Risk Interventions     No data to display

## 2022-10-05 NOTE — Progress Notes (Signed)
RN staff contacted Chief Strategy Officer with request to speak to pt's partner/caregiver. Pt's caregiver/ friend (listed in chart)  Saralyn Pilar had several questions about POC/rehab. He and pt has discussed and are planning to return home at [facility] with hired private caregivers. Saralyn Pilar requested information on private caregiver. Gave him CM/TOC number for more information about personal care givers for hire. Discussed that PT still recommends SNF until pt can show he can safely perform ADLs. Saralyn Pilar requested 12 pm PT session tomorrow to see how pt is able to move prior to making final decision on STR vs home.

## 2022-10-05 NOTE — Plan of Care (Signed)
  Problem: Clinical Measurements: Goal: Ability to maintain clinical measurements within normal limits will improve Outcome: Progressing   Problem: Clinical Measurements: Goal: Diagnostic test results will improve Outcome: Progressing   Problem: Activity: Goal: Risk for activity intolerance will decrease Outcome: Progressing   Problem: Nutrition: Goal: Adequate nutrition will be maintained Outcome: Progressing   Problem: Safety: Goal: Ability to remain free from injury will improve Outcome: Progressing

## 2022-10-05 NOTE — NC FL2 (Signed)
[place] LEVEL OF CARE SCREENING TOOL     IDENTIFICATION  Patient Name: [name] Birthdate: September 21, 1946 Sex: male Admission Date (Current Location): 10/02/2022  [facility] and [place] Number:  Engineering geologist and Address:  [facility], [address] [place],  [address]      Provider Number: [id number]  Attending Physician Name and Address:  Shawna Clamp, MD  Relative Name and Phone Number:  Saralyn Pilar [phone]    Current Level of Care: Hospital Recommended Level of Care: [place] Prior Approval Number:    Date Approved/Denied:   PASRR Number: [id number]  Discharge Plan: SNF    Current Diagnoses: Patient Active Problem List   Diagnosis Date Noted   MRSA bacteremia 10/04/2022   Septic arthritis of knee, left ([place]) 10/03/2022   AKI (acute kidney injury) ([place]) 10/03/2022   DVT (deep venous thrombosis) ([place]) 10/03/2022   Uncontrolled type 1 diabetes mellitus with hyperglycemia, with long-term current use of insulin ([place]) 10/03/2022   DKA (diabetic ketoacidoses) 12/16/2018   Arteriosclerosis of coronary artery 06/08/2015   BP (high blood pressure) 06/08/2015   Back pain 02/23/2015   Diaphoresis 01/02/2012   Type 1 diabetes mellitus without complication ([place]) 35/70/1779   Hyperlipidemia 12/27/2010   HYPERTENSION, BENIGN 12/27/2010   CAD, NATIVE VESSEL 12/27/2010   EDEMA 12/27/2010    Orientation RESPIRATION BLADDER Height & Weight     Self, Time, Situation, Place  Normal Continent Weight: 195 lb (88.5 kg) Height:  '5\' 9"'$  (175.3 cm)  BEHAVIORAL SYMPTOMS/MOOD NEUROLOGICAL BOWEL NUTRITION STATUS      Continent Diet (see dc summary)  AMBULATORY STATUS COMMUNICATION OF NEEDS Skin   Extensive Assist Verbally Surgical wounds (Knee)                       Personal Care Assistance Level of Assistance  Bathing, Feeding, Dressing Bathing Assistance: Limited assistance Feeding  assistance: Independent Dressing Assistance: Limited assistance     Functional Limitations Info  Sight, Hearing, Speech Sight Info: Adequate Hearing Info: Adequate Speech Info: Adequate    SPECIAL CARE FACTORS FREQUENCY  PT (By licensed PT), OT (By licensed OT)     PT Frequency: 5x week OT Frequency: 5x week            Contractures Contractures Info: Not present    Additional Factors Info  Code Status, Allergies, Insulin Sliding Scale, Isolation Precautions Code Status Info: Full Allergies Info: nka   Insulin Sliding Scale Info: insulin aspart (novoLOG) injection 0-20 Units Isolation Precautions Info: MRSA-blood     Current Medications (10/05/2022):  This is the current hospital active medication list Current Facility-Administered Medications  Medication Dose Route Frequency Provider Last Rate Last Admin   0.9 %  sodium chloride infusion   Intravenous Continuous Shawna Clamp, MD 75 mL/hr at 10/05/22 1218 Infusion Verify at 10/05/22 1218   acetaminophen (TYLENOL) tablet 650 mg  650 mg Oral Q6H PRN Poggi, Marshall Cork, MD       Or   acetaminophen (TYLENOL) suppository 650 mg  650 mg Rectal Q6H PRN Poggi, Marshall Cork, MD       aspirin EC tablet 81 mg  81 mg Oral Daily Poggi, Marshall Cork, MD   81 mg at 10/05/22 0814   atorvastatin (LIPITOR) tablet 40 mg  40 mg Oral Daily Tsosie Billing, MD   40 mg at 10/05/22 0814   bisacodyl (DULCOLAX) suppository 10 mg  10 mg Rectal Daily PRN Poggi, Marshall Cork,  MD       carvedilol (COREG) tablet 6.25 mg  6.25 mg Oral BID WC Poggi, Marshall Cork, MD   6.25 mg at 10/05/22 0815   cyanocobalamin (VITAMIN B12) tablet 1,000 mcg  1,000 mcg Oral Caleen Essex, MD   1,000 mcg at 10/04/22 3557   DAPTOmycin (CUBICIN) 700 mg in sodium chloride 0.9 % IVPB  8 mg/kg Intravenous Q2000 Ravishankar, Joellyn Quails, MD       diphenhydrAMINE (BENADRYL) 12.5 MG/5ML elixir 12.5-25 mg  12.5-25 mg Oral Q4H PRN Poggi, Marshall Cork, MD       docusate sodium (COLACE) capsule 200 mg  200  mg Oral BID Poggi, Marshall Cork, MD   200 mg at 10/05/22 0815   enoxaparin (LOVENOX) injection 40 mg  40 mg Subcutaneous Q24H Poggi, Marshall Cork, MD   40 mg at 10/05/22 0815   famotidine (PEPCID) tablet 20 mg  20 mg Oral BID Corky Mull, MD   20 mg at 10/05/22 3220   hyoscyamine (LEVSIN SL) SL tablet 0.125-0.25 mg  0.125-0.25 mg Sublingual Q4H PRN Poggi, Marshall Cork, MD       insulin aspart (novoLOG) injection 0-20 Units  0-20 Units Subcutaneous TID WC Shawna Clamp, MD       insulin aspart (novoLOG) injection 4 Units  4 Units Subcutaneous TID WC Shawna Clamp, MD   4 Units at 10/05/22 1229   [START ON 10/06/2022] insulin glargine-yfgn (SEMGLEE) injection 28 Units  28 Units Subcutaneous Daily Shawna Clamp, MD       magnesium hydroxide (MILK OF MAGNESIA) suspension 30 mL  30 mL Oral Daily PRN Poggi, Marshall Cork, MD       metoCLOPramide (REGLAN) tablet 5-10 mg  5-10 mg Oral Q8H PRN Poggi, Marshall Cork, MD       Or   metoCLOPramide (REGLAN) injection 5-10 mg  5-10 mg Intravenous Q8H PRN Poggi, Marshall Cork, MD       morphine (PF) 2 MG/ML injection 2-4 mg  2-4 mg Intravenous Q4H PRN Poggi, Marshall Cork, MD       multivitamin with minerals tablet 1 tablet  1 tablet Oral Daily Poggi, Marshall Cork, MD   1 tablet at 10/05/22 0814   ondansetron (ZOFRAN) tablet 4 mg  4 mg Oral Q6H PRN Poggi, Marshall Cork, MD       Or   ondansetron (ZOFRAN) injection 4 mg  4 mg Intravenous Q6H PRN Poggi, Marshall Cork, MD       oxyCODONE (Oxy IR/ROXICODONE) immediate release tablet 5-10 mg  5-10 mg Oral Q4H PRN Poggi, Marshall Cork, MD   10 mg at 10/05/22 0735   phenol (CHLORASEPTIC) mouth spray 1 spray  1 spray Mouth/Throat PRN Shawna Clamp, MD   1 spray at 10/05/22 0815   senna (SENOKOT) tablet 8.6 mg  1 tablet Oral BID Corky Mull, MD   8.6 mg at 10/05/22 0815   sodium phosphate (FLEET) 7-19 GM/118ML enema 1 enema  1 enema Rectal Once PRN Poggi, Marshall Cork, MD         Discharge Medications: Please see discharge summary for a list of discharge medications.  Relevant Imaging  Results:  Relevant Lab Results:   Additional Information SSN [id number]  Loreta Ave, LCSWA

## 2022-10-05 NOTE — Progress Notes (Signed)
*  PRELIMINARY RESULTS* Echocardiogram 2D Echocardiogram has been performed. Definity IV ultrasound imaging agent used on this study.  Claretta Fraise 10/05/2022, 10:29 AM

## 2022-10-05 NOTE — Inpatient Diabetes Management (Signed)
Inpatient Diabetes Program Recommendations  AACE/ADA: New Consensus Statement on Inpatient Glycemic Control (2015)  Target Ranges:  Prepandial:   less than 140 mg/dL      Peak postprandial:   less than 180 mg/dL (1-2 hours)      Critically ill patients:  140 - 180 mg/dL   Lab Results  Component Value Date   GLUCAP 379 (H) 10/05/2022   HGBA1C 7.3 (H) 10/03/2022    Latest Reference Range & Units 10/04/22 16:56 10/04/22 20:36 10/05/22 08:13 10/05/22 11:31  Glucose-Capillary 70 - 99 mg/dL 403 (H) 375 (H) 385 (H) 379 (H)  (H): Data is abnormally high Review of Glycemic Control  Diabetes history: type 1 diabetes Outpatient Diabetes medications: insulin pump Current orders for Inpatient glycemic control: Semglee 20 units daily, Novolog 0-15 units TID, Novolog 4 units TID with meals  Inpatient Diabetes Program Recommendations:   Noted that patient's blood sugars are still greater than 300 mg/dl after getting Semglee 20 units last night.  Recommend increasing Semglee to 28 units daily (insulin pump at home total basal=25 units), increase Novolog correction scale to RESISTANT TID, continue Novolog 4 units TID with meals if eating at least 50% of meal.   Harvel Ricks RN BSN CDE Diabetes Coordinator Pager: [phone]  8am-5pm
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
## 2022-10-05 NOTE — Progress Notes (Addendum)
PROGRESS NOTE    [name]  FIE:[id number] DOB: 03-Feb-1946 DOA: 10/02/2022 PCP: [name], MD   Brief Narrative:  This 76 years old male with PMH significant for type 1 diabetes on insulin pump, history of left leg DVT, coronary artery disease s/p stent angioplasty, GERD, history of bladder cancer, history of ureteral stricture s/p dilatation, prior history of MRSA infection presented in the ED for the evaluation of left knee pain which has been there for last 6 days.  Patient reports he has developed left knee pain 6 days ago which has progressively worsened resulting in an inability to bear weight or bend his left knee.  He denies any recent trauma or falls.  He was seen by primary care physician twice and was started on Mobic without any improvement and then he received lidocaine/Kenalog injection 1 day prior to his admission which also shows no improvement in his symptoms.  Patient had an MRI of left knee which shows moderate knee joint effusion with synovial thickening suggesting synovitis. He is also found to have complex tear of posterior horn of medial meniscus. Orthopedics was consulted and patient was taken to the OR for arthroscopy and washout of left knee.  Tolerated well.  Assessment & Plan:   Principal Problem:   Uncontrolled type 1 diabetes mellitus with hyperglycemia, with long-term current use of insulin ([name]) Active Problems:   Septic arthritis of knee, left ([name])   AKI (acute kidney injury) ([place])   DVT (deep venous thrombosis) ([name])   HYPERTENSION, BENIGN   CAD, NATIVE VESSEL   MRSA bacteremia  Septic arthritis of left knee: MRSA Bacteremia: Patient presented for the evaluation of worsening left leg pain and swelling . He is unable to bear weight on left leg. He is noted to have marked leukocytosis which could be related to systemic steroid administration.   MRI concerning for septic arthritis and meniscal tear. Orthopedics was consulted. He underwent  extensive arthroscopic debridement with partial meniscectomy Empirically started on vancomycin and Rocephin. Follow-up results of synovial fluid analysis. Begin physical therapy for ambulation and gentle range of motion. Infectious diseases consulted, antibiotics changed to daptomycin given worsening renal functions. 2D echocardiogram shows LVEF 35 to 40%.  No vegetations. He will eventually need TEE that will be scheduled.  Chronic systolic heart failure: It could be acute or chronic or acute on chronic CHF. TTE shows LVEF 35 to 40% reduced left ventricular functions. He appears euvolemic on exam.  Consult cardiology as patient may require TEE.  Uncontrolled type 1 diabetes mellitus with hyperglycemia: Most likely related to systemic steroid injection the patient has received for left knee pain. Hold insulin pump for now. Continue gentle fluid resuscitation. Continue Semglee 20 units daily with sliding scale coverage. Carb modified diet.   Acute kidney injury : Baseline serum creatinine 1.0, presented in the ED with creatinine of 1.38 Unclear etiology of worsening renal functions but could be related to recent NSAID use. Continue aggressive IV fluid resuscitation.  Avoid nephrotoxic medications. Serum creatinine trending up.  Left peroneal vein DVT: Patient noted to have nonocclusive thrombus seen within the left calf peroneal vein. Continue Eliquis.   Coronary artery disease, native vessel. Stable. Continue Coreg, Lipitor,  and aspirin.   Essential hypertension Continue Coreg.    DVT prophylaxis: Eliquis Code Status: Full code Family Communication: Partner at bedside Disposition Plan:   Status is: Inpatient Remains inpatient appropriate because: Patient admitted for left knee swelling,  found to have septic arthritis requiring IV antibiotics.  Orthopedics is consulted,  Patient underwent repair of the meniscus tear.  Plan anticipated discharge to SNF.   Consultants:   Orthopedics  Procedures: Arthroscopy with debridement with partial meniscectomy Antimicrobials: Anti-infectives (From admission, onward)    Start     Dose/Rate Route Frequency Ordered Stop   10/05/22 1200  DAPTOmycin (CUBICIN) 700 mg in sodium chloride 0.9 % IVPB        8 mg/kg  88.5 kg 128 mL/hr over 30 Minutes Intravenous Daily 10/04/22 1550     10/05/22 0800  vancomycin (VANCOREADY) IVPB 1250 mg/250 mL  Status:  Discontinued        1,250 mg 166.7 mL/hr over 90 Minutes Intravenous Every 24 hours 10/04/22 0840 10/04/22 1550   10/04/22 1600  vancomycin (VANCOREADY) IVPB 1250 mg/250 mL  Status:  Discontinued        1,250 mg 166.7 mL/hr over 90 Minutes Intravenous Every 24 hours 10/03/22 1359 10/04/22 0746   10/04/22 0800  vancomycin (VANCOREADY) IVPB 2000 mg/400 mL        2,000 mg 200 mL/hr over 120 Minutes Intravenous  Once 10/04/22 0747 10/04/22 1026   10/03/22 0815  vancomycin (VANCOREADY) IVPB 2000 mg/400 mL  Status:  Discontinued        2,000 mg 200 mL/hr over 120 Minutes Intravenous  Once 10/03/22 0811 10/04/22 0746   10/03/22 0800  cefTRIAXone (ROCEPHIN) 2 g in sodium chloride 0.9 % 100 mL IVPB  Status:  Discontinued        2 g 200 mL/hr over 30 Minutes Intravenous Every 24 hours 10/03/22 0754 10/04/22 0159       Subjective: Patient was seen and examined at bedside.Overnight events noted. Patient still reports having pain in the left knee but improving.He denies any fever. Patient s/p arthroscopic debridement with partial meniscectomy. POD #2.  Objective: Vitals:   10/04/22 0409 10/04/22 0801 10/05/22 0151 10/05/22 0809  BP: (!) 127/57 (!) 111/57 (!) 125/58 136/70  Pulse: 82 98 72 79  Resp: '18 16 16 17  '$ Temp: 97.6 F (36.4 C) 98.2 F (36.8 C) 97.7 F (36.5 C) 97.8 F (36.6 C)  TempSrc: Oral     SpO2: 99% 91% 97%   Weight:      Height:        Intake/Output Summary (Last 24 hours) at 10/05/2022 1142 Last data filed at 10/05/2022 0215 Gross per 24 hour   Intake 648.16 ml  Output 350 ml  Net 298.16 ml   Filed Weights   10/02/22 2229  Weight: 88.5 kg    Examination:  General exam: Appears comfortable, not in any acute distress, deconditioned. Respiratory system: CTA bilaterally, respiratory effort normal, RR 15 Cardiovascular system: S1 & S2 heard, regular rate and rhythm, no murmur. Gastrointestinal system: Abdomen is soft, non tender, non distended, BS+ Central nervous system: Alert and oriented x 3. No focal neurological deficits. Extremities: Left knee covered in compression dressing.  Left knee tenderness noted. Skin: No rashes, lesions or ulcers Psychiatry: Judgement and insight appear normal. Mood & affect appropriate.     Data Reviewed: I have personally reviewed following labs and imaging studies  CBC: Recent Labs  Lab 10/03/22 0658 10/04/22 0459 10/05/22 0500  WBC 20.5* 23.9* 21.7*  NEUTROABS 17.1*  --   --   HGB 10.5* 11.3* 10.8*  HCT 32.0* 33.8* 31.6*  MCV 88.6 88.5 87.5  PLT 398 470* 388*   Basic Metabolic Panel: Recent Labs  Lab 10/03/22 0658 10/04/22 0459 10/05/22 0500  NA 136 137 134*  K 4.6 5.3*  5.2* 4.8  CL 103 102 103  CO2 18* 19* 21*  GLUCOSE 390* 515* 369*  BUN 59* 72* 76*  CREATININE 1.38* 1.52* 1.67*  CALCIUM 9.1 8.9 8.5*  MG  --   --  2.2  PHOS  --   --  3.0   GFR: Estimated Creatinine Clearance: 42.1 mL/min (A) (by C-G formula based on SCr of 1.67 mg/dL (H)). Liver Function Tests: Recent Labs  Lab 10/03/22 0658 10/05/22 0500  AST 19 14*  ALT 18 13  ALKPHOS 100 100  BILITOT 1.9* 1.1  PROT 6.6 5.7*  ALBUMIN 2.7* 2.2*   No results for input(s): "LIPASE", "AMYLASE" in the last 168 hours. No results for input(s): "AMMONIA" in the last 168 hours. Coagulation Profile: No results for input(s): "INR", "PROTIME" in the last 168 hours. Cardiac Enzymes: Recent Labs  Lab 10/04/22 0900 10/05/22 0500  CKTOTAL 82 49   BNP (last 3 results) No results for input(s): "PROBNP" in  the last 8760 hours. HbA1C: Recent Labs    10/03/22 0654  HGBA1C 7.3*   CBG: Recent Labs  Lab 10/04/22 1450 10/04/22 1656 10/04/22 2036 10/05/22 0813 10/05/22 1131  GLUCAP 397* 403* 375* 385* 379*   Lipid Profile: No results for input(s): "CHOL", "HDL", "LDLCALC", "TRIG", "CHOLHDL", "LDLDIRECT" in the last 72 hours. Thyroid Function Tests: No results for input(s): "TSH", "T4TOTAL", "FREET4", "T3FREE", "THYROIDAB" in the last 72 hours. Anemia Panel: No results for input(s): "VITAMINB12", "FOLATE", "FERRITIN", "TIBC", "IRON", "RETICCTPCT" in the last 72 hours. Sepsis Labs: Recent Labs  Lab 10/03/22 0906 10/03/22 1500  LATICACIDVEN 1.9 1.6    Recent Results (from the past 240 hour(s))  Blood Culture (routine x 2)     Status: Abnormal (Preliminary result)   Collection Time: 10/03/22  9:06 AM   Specimen: BLOOD  Result Value Ref Range Status   Specimen Description   Final    BLOOD RIGHT [facility] Performed at [facility], [address] [place], [place] [address]    Special Requests   Final    BOTTLES DRAWN AEROBIC AND ANAEROBIC Blood Culture adequate volume Performed at [facility], [address] [place], [place] [address]    Culture  Setup Time   Final    GRAM POSITIVE COCCI IN BOTH AEROBIC AND ANAEROBIC BOTTLES CRITICAL RESULT CALLED TO, READ BACK BY AND VERIFIED WITH: Violeta Gelinas PHARMD AT 0938 10/04/2022 GAA    Culture (A)  Final    STAPHYLOCOCCUS AUREUS SUSCEPTIBILITIES TO FOLLOW Performed at [facility], [place] [address] [place], [place] [address]    Report Status PENDING  Incomplete  Blood Culture (routine x 2)     Status: Abnormal (Preliminary result)   Collection Time: 10/03/22  9:06 AM   Specimen: BLOOD  Result Value Ref Range Status   Specimen Description   Final    BLOOD RIGTH [facility] Performed at  [facility], [address] [place], [place] [address]    Special Requests   Final    BOTTLES DRAWN AEROBIC AND  ANAEROBIC Blood Culture adequate volume Performed at [facility], [address] [place], [place] [address]    Culture  Setup Time   Final    GRAM POSITIVE COCCI IN BOTH AEROBIC AND ANAEROBIC BOTTLES GRAM STAIN REVIEWED-AGREE WITH RESULT CRITICAL VALUE NOTED.  VALUE IS CONSISTENT WITH PREVIOUSLY REPORTED AND CALLED VALUE. Performed at [facility], [address], [place], [place] [address]    Culture STAPHYLOCOCCUS AUREUS (A)  Final   Report Status PENDING  Incomplete  Blood Culture ID Panel (Reflexed)     Status: Abnormal   Collection Time: 10/03/22  9:06 AM  Result Value Ref Range Status   Enterococcus faecalis NOT DETECTED NOT DETECTED Final   Enterococcus Faecium NOT DETECTED NOT DETECTED Final   Listeria monocytogenes NOT DETECTED NOT DETECTED Final   Staphylococcus species DETECTED (A) NOT DETECTED Final    Comment: CRITICAL RESULT CALLED TO, READ BACK BY AND VERIFIED WITH: JASON ROBBINS PHARMD AT 0150 10/04/2022 GAA    Staphylococcus aureus (BCID) DETECTED (A) NOT DETECTED Final    Comment: Methicillin (oxacillin)-resistant Staphylococcus aureus (MRSA). MRSA is predictably resistant to beta-lactam antibiotics (except ceftaroline). Preferred therapy is vancomycin unless clinically contraindicated. Patient requires contact precautions if  hospitalized. CRITICAL RESULT CALLED TO, READ BACK BY AND VERIFIED WITH: Violeta Gelinas PHARMD AT 9758 10/04/2022 GAA    Staphylococcus epidermidis NOT DETECTED NOT DETECTED Final   Staphylococcus lugdunensis NOT DETECTED NOT DETECTED Final   Streptococcus species NOT DETECTED NOT DETECTED Final   Streptococcus agalactiae NOT DETECTED NOT DETECTED Final   Streptococcus pneumoniae NOT DETECTED NOT DETECTED Final   Streptococcus pyogenes NOT DETECTED NOT DETECTED Final   A.calcoaceticus-baumannii NOT DETECTED NOT DETECTED Final   Bacteroides fragilis NOT DETECTED NOT DETECTED Final   Enterobacterales NOT DETECTED NOT  DETECTED Final   Enterobacter cloacae complex NOT DETECTED NOT DETECTED Final   Escherichia coli NOT DETECTED NOT DETECTED Final   Klebsiella aerogenes NOT DETECTED NOT DETECTED Final   Klebsiella oxytoca NOT DETECTED NOT DETECTED Final   Klebsiella pneumoniae NOT DETECTED NOT DETECTED Final   Proteus species NOT DETECTED NOT DETECTED Final   Salmonella species NOT DETECTED NOT DETECTED Final   Serratia marcescens NOT DETECTED NOT DETECTED Final   Haemophilus influenzae NOT DETECTED NOT DETECTED Final   Neisseria meningitidis NOT DETECTED NOT DETECTED Final   Pseudomonas aeruginosa NOT DETECTED NOT DETECTED Final   Stenotrophomonas maltophilia NOT DETECTED NOT DETECTED Final   Candida albicans NOT DETECTED NOT DETECTED Final   Candida auris NOT DETECTED NOT DETECTED Final   Candida glabrata NOT DETECTED NOT DETECTED Final   Candida krusei NOT DETECTED NOT DETECTED Final   Candida parapsilosis NOT DETECTED NOT DETECTED Final   Candida tropicalis NOT DETECTED NOT DETECTED Final   Cryptococcus neoformans/gattii NOT DETECTED NOT DETECTED Final   Meth resistant mecA/C and MREJ DETECTED (A) NOT DETECTED Final    Comment: CRITICAL RESULT CALLED TO, READ BACK BY AND VERIFIED WITHVioleta Gelinas PHARMD AT 8325 10/04/2022 GAA Performed at [facility], [address] [place], [place] [address]   Aerobic/Anaerobic Culture w Gram Stain (surgical/deep wound)     Status: None (Preliminary result)   Collection Time: 10/03/22 11:45 AM   Specimen: PATH Cytology Misc. fluid; Body Fluid  Result Value Ref Range Status   Specimen Description   Final    KNEE LEFT Performed at [facility], [address], [place], [place] [address]    Special Requests   Final    NONE Performed at [facility], [address] [place], [place] [address]    Gram Stain   Final    ABUNDANT WBC PRESENT, PREDOMINANTLY PMN MODERATE GRAM POSITIVE COCCI IN PAIRS IN CLUSTERS Gram Stain Report  Called to,Read Back By and Verified With:  Doretha Sou MCWHITE, RN 10/04/22 0100 A. LAFRANCE    Culture   Final    ABUNDANT STAPHYLOCOCCUS AUREUS CULTURE REINCUBATED FOR BETTER GROWTH Performed at  [facility], [place] [address] [place], [place] [address]    Report Status PENDING  Incomplete  Culture, blood (Routine X 2) w Reflex to ID Panel     Status: None (Preliminary result)   Collection Time: 10/05/22  4:59 AM   Specimen: BLOOD RIGHT WRIST  Result Value Ref Range Status   Specimen Description BLOOD RIGHT WRIST  Final   Special Requests Blood Culture adequate volume  Final   Culture   Final    NO GROWTH <12 HOURS Performed at [facility], [address] [place], [place] [address]    Report Status PENDING  Incomplete         Radiology Studies: ECHOCARDIOGRAM COMPLETE  Result Date: 10/05/2022    ECHOCARDIOGRAM REPORT   Patient Name:   [name] Date of Exam: 10/05/2022 Medical Rec #:  [id number]            Height:       69.0 in Accession #:    [id number]           Weight:       195.0 lb Date of Birth:  [birth year]            BSA:          2.044 m Patient Age:    46 years             BP:           125/58 mmHg Patient Gender: M                    HR:           80 bpm. Exam Location:  [facility] Procedure: 2D Echo and Intracardiac Opacification Agent Indications:     Bacteremia R78.81  History:         Patient has no prior history of Echocardiogram examinations.  Sonographer:     Kathlen Brunswick RDCS Referring Phys:  [id number] Tsosie Billing Diagnosing Phys: Serafina Royals MD  Sonographer Comments: Technically difficult study due to poor echo windows and suboptimal subcostal window. The study was performed while patient was positioned supine, unable to turn at this time. IMPRESSIONS  1. Left ventricular ejection fraction, by estimation, is 35 to 40%. The left ventricle has moderately decreased function. The left ventricle demonstrates regional wall motion abnormalities  (see scoring diagram/findings for description). The left ventricular internal cavity size was mildly dilated. Left ventricular diastolic parameters were normal.  2. Right ventricular systolic function is normal. The right ventricular size is normal.  3. Left atrial size was mildly dilated.  4. The mitral valve is normal in structure. Mild mitral valve regurgitation.  5. The aortic valve is normal in structure. Aortic valve regurgitation is trivial. FINDINGS  Left Ventricle: Left ventricular ejection fraction, by estimation, is 35 to 40%. The left ventricle has moderately decreased function. The left ventricle demonstrates regional wall motion abnormalities. Severe akinesis of the left ventricular, apical apical segment. Definity contrast agent was given IV to delineate the left ventricular endocardial borders. The left ventricular internal cavity size was mildly dilated. There is no left ventricular hypertrophy. Left ventricular diastolic parameters were  normal. Right Ventricle: The right ventricular size is normal. No increase in right ventricular wall thickness. Right ventricular systolic function is normal. Left Atrium: Left atrial size was mildly dilated. Right Atrium: Right atrial size was normal in size. Pericardium: There is no evidence of pericardial effusion. Mitral Valve: The mitral valve is normal in  structure. Mild mitral valve regurgitation. There is no evidence of mitral valve vegetation. Tricuspid Valve: The tricuspid valve is normal in structure. Tricuspid valve regurgitation is mild. There is no evidence of tricuspid valve vegetation. Aortic Valve: The aortic valve is normal in structure. Aortic valve regurgitation is trivial. Aortic valve peak gradient measures 5.7 mmHg. There is no evidence of aortic valve vegetation. Pulmonic Valve: The pulmonic valve was normal in structure. Pulmonic valve regurgitation is trivial. Aorta: The aortic root and ascending aorta are structurally normal, with no  evidence of dilitation. IAS/Shunts: No atrial level shunt detected by color flow Doppler.  LEFT VENTRICLE PLAX 2D LVIDd:         6.30 cm      Diastology LVIDs:         4.60 cm      LV e' medial:    8.05 cm/s LV PW:         1.30 cm      LV E/e' medial:  9.2 LV IVS:        0.90 cm      LV e' lateral:   11.70 cm/s LVOT diam:     2.10 cm      LV E/e' lateral: 6.3 LV SV:         69 LV SV Index:   34 LVOT Area:     3.46 cm  LV Volumes (MOD) LV vol d, MOD A2C: 157.0 ml LV vol d, MOD A4C: 106.6 ml LV vol s, MOD A2C: 80.3 ml LV vol s, MOD A4C: 64.4 ml LV SV MOD A2C:     76.7 ml LV SV MOD A4C:     106.6 ml LV SV MOD BP:      61.3 ml RIGHT VENTRICLE RV Basal diam:  2.90 cm RV S prime:     12.50 cm/s TAPSE (M-mode): 2.3 cm LEFT ATRIUM           Index        RIGHT ATRIUM           Index LA diam:      4.30 cm 2.10 cm/m   RA Area:     13.60 cm LA Vol (A2C): 72.8 ml 35.62 ml/m  RA Volume:   31.10 ml  15.22 ml/m LA Vol (A4C): 60.9 ml 29.80 ml/m  AORTIC VALVE                 PULMONIC VALVE AV Area (Vmax): 2.25 cm     PV Vmax:       0.85 m/s AV Vmax:        119.00 cm/s  PV Peak grad:  2.9 mmHg AV Peak Grad:   5.7 mmHg LVOT Vmax:      77.40 cm/s LVOT Vmean:     55.500 cm/s LVOT VTI:       0.200 m  AORTA Ao Root diam: 3.90 cm MITRAL VALVE MV Area (PHT): 4.26 cm    SHUNTS MV Decel Time: 178 msec    Systemic VTI:  0.20 m MV E velocity: 73.90 cm/s  Systemic Diam: 2.10 cm MV A velocity: 68.10 cm/s MV E/A ratio:  1.09 Serafina Royals MD Electronically signed by Serafina Royals MD Signature Date/Time: 10/05/2022/11:16:56 AM    Final     Scheduled Meds:  aspirin EC  81 mg Oral Daily   atorvastatin  40 mg Oral Daily   carvedilol  6.25 mg Oral BID WC   cyanocobalamin  1,000 mcg Oral QODAY  docusate sodium  200 mg Oral BID   enoxaparin (LOVENOX) injection  40 mg Subcutaneous Q24H   famotidine  20 mg Oral BID   insulin aspart  0-15 Units Subcutaneous TID WC   insulin aspart  4 Units Subcutaneous TID WC   insulin glargine-yfgn   20 Units Subcutaneous Daily   multivitamin with minerals  1 tablet Oral Daily   senna  1 tablet Oral BID   Continuous Infusions:  sodium chloride 75 mL/hr at 10/05/22 0734   DAPTOmycin (CUBICIN) 700 mg in sodium chloride 0.9 % IVPB       LOS: 2 days    Time spent: 35 mins    Fatema Rabe, MD [facility]   If 7PM-7AM, please contact night-coverage

## 2022-10-05 NOTE — Evaluation (Signed)
Occupational Therapy Evaluation Patient Details Name: [name] MRN: [id number] DOB: Mar 27, 1946 Today's Date: 10/05/2022   History of Present Illness presented to ER and admitted secondary to progressive L knee pain, s/p arthroscopic debridement, partial menisectomy (10/03/22); hospital stay complicated by uncontrolled DM   Clinical Impression   Patient presenting with decreased Ind in self care,balance, functional mobility/transfers, endurance, and safety awareness. Patient reports being mod I at baseline and living with partner who works outside of the home. Pt is very pleasant this session but with increased confusion. Mod-max multimodal cuing for sequencing and safety awareness. Pt remains mostly NWB on L LE even after multiple cues to attempt weight bearing with ambulation. Pt is very slow and fatigues quickly with mobility to bathroom for toileting. Mod A for LB clothing management and hygiene. Pt then transitioned to PT session. Patient will benefit from acute OT to increase overall independence in the areas of ADLs, functional mobility, and safety awareness in order to safely discharge to next venue of care.      Recommendations for follow up therapy are one component of a multi-disciplinary discharge planning process, led by the attending physician.  Recommendations may be updated based on patient status, additional functional criteria and insurance authorization.   Follow Up Recommendations  Skilled nursing-short term rehab (<3 hours/day)     Assistance Recommended at Discharge Frequent or constant Supervision/Assistance  Patient can return home with the following A lot of help with bathing/dressing/bathroom;A little help with walking and/or transfers;Assistance with cooking/housework;Help with stairs or ramp for entrance;Assist for transportation;Direct supervision/assist for financial management;Direct supervision/assist for medications management    Functional Status  Assessment  Patient has had a recent decline in their functional status and demonstrates the ability to make significant improvements in function in a reasonable and predictable amount of time.  Equipment Recommendations  BSC/3in1       Precautions / Restrictions Precautions Precautions: Fall Restrictions Weight Bearing Restrictions: Yes LLE Weight Bearing: Weight bearing as tolerated      Mobility Bed Mobility Overal bed mobility: Needs Assistance Bed Mobility: Supine to Sit     Supine to sit: Supervision     General bed mobility comments: heavy cuing for pt to manage R LE independently    Transfers Overall transfer level: Needs assistance Equipment used: Rolling walker (2 wheels) Transfers: Sit to/from Stand Sit to Stand: Min assist           General transfer comment: cuing for hand/foot placement      Balance Overall balance assessment: Needs assistance Sitting-balance support: No upper extremity supported, Feet supported Sitting balance-Leahy Scale: Good     Standing balance support: Bilateral upper extremity supported, Reliant on assistive device for balance, During functional activity Standing balance-Leahy Scale: Fair                             ADL either performed or assessed with clinical judgement   ADL Overall ADL's : Needs assistance/impaired                     Lower Body Dressing: Moderate assistance;Sit to/from stand   Toilet Transfer: Minimal assistance;Moderate assistance;Rolling walker (2 wheels);Regular Toilet;Ambulation   Toileting- Clothing Manipulation and Hygiene: Moderate assistance;Sit to/from stand               Vision Patient Visual Report: No change from baseline              Pertinent  Vitals/Pain Pain Assessment Pain Assessment: Faces Faces Pain Scale: Hurts little more Pain Location: L knee Pain Descriptors / Indicators: Aching, Guarding, Discomfort Pain Intervention(s): Limited activity  within patient's tolerance, Monitored during session, Repositioned     Hand Dominance Right   Extremity/Trunk Assessment Upper Extremity Assessment Upper Extremity Assessment: Overall WFL for tasks assessed;Generalized weakness   Lower Extremity Assessment Lower Extremity Assessment: Defer to PT evaluation       Communication Communication Communication: No difficulties   Cognition Arousal/Alertness: Awake/alert Behavior During Therapy: WFL for tasks assessed/performed Overall Cognitive Status: Impaired/Different from baseline Area of Impairment: Attention, Following commands, Safety/judgement, Awareness, Problem solving                   Current Attention Level: Focused   Following Commands: Follows one step commands consistently, Follows one step commands with increased time Safety/Judgement: Decreased awareness of safety, Decreased awareness of deficits Awareness: Intellectual Problem Solving: Slow processing, Difficulty sequencing, Requires verbal cues General Comments: Mod multimodal cuing secondary to increased time to process                Home Living Family/patient expects to be discharged to:: Private residence Living Arrangements: Spouse/significant other Available Help at Discharge: Family;Available PRN/intermittently Type of Home: House Home Access: Stairs to enter [employer] of Steps: 6 Entrance Stairs-Rails: Right Home Layout: Two level;Able to live on main level with bedroom/bathroom     Bathroom Shower/Tub: Tub/shower unit         Home Equipment: Cane - single point          Prior Functioning/Environment Prior Level of Function : Independent/Modified Independent             Mobility Comments: household and community mobilization without assist device; endorses recent use of SPC due to progressive L knee pain.  Denies fall history. ADLs Comments: Pt reports Ind with self care and some IADL tasks shared with partner         OT Problem List: Decreased strength;Decreased activity tolerance;Decreased safety awareness;Impaired balance (sitting and/or standing);Pain;Decreased cognition;Decreased knowledge of use of DME or AE      OT Treatment/Interventions: Self-care/ADL training;Therapeutic exercise;Therapeutic activities;Energy conservation;DME and/or AE instruction;Patient/family education;Balance training;Cognitive remediation/compensation    OT Goals(Current goals can be found in the care plan section) Acute Rehab OT Goals Patient Stated Goal: to get stronger and decrease pain OT Goal Formulation: With patient Time For Goal Achievement: 10/19/22 Potential to Achieve Goals: Fair ADL Goals Pt Will Perform Grooming: with supervision;standing Pt Will Perform Lower Body Dressing: with min guard assist Pt Will Transfer to Toilet: with min guard assist;ambulating Pt Will Perform Toileting - Clothing Manipulation and hygiene: with min guard assist;sit to/from stand  OT Frequency: Min 2X/week       AM-PAC OT "6 Clicks" Daily Activity     Outcome Measure Help from another person eating meals?: None Help from another person taking care of personal grooming?: None Help from another person toileting, which includes using toliet, bedpan, or urinal?: A Lot Help from another person bathing (including washing, rinsing, drying)?: A Lot Help from another person to put on and taking off regular upper body clothing?: A Little Help from another person to put on and taking off regular lower body clothing?: A Lot 6 Click Score: 17   End of Session Equipment Utilized During Treatment: Rolling walker (2 wheels) Nurse Communication: Mobility status  Activity Tolerance: Patient limited by fatigue Patient left: Other (comment) (transitioned to PT session)  OT Visit Diagnosis:  Unsteadiness on feet (R26.81);Repeated falls (R29.6);Muscle weakness (generalized) (M62.81)                Time: 1010-1039 OT Time Calculation  (min): 29 min Charges:  OT General Charges $OT Visit: 1 Visit OT Evaluation $OT Eval Moderate Complexity: 1 Mod OT Treatments $Self Care/Home Management : 8-22 mins  Darleen Crocker, MS, OTR/L , CBIS ascom [phone]  10/05/22, 1:14 PM

## 2022-10-06 DIAGNOSIS — I2510 Atherosclerotic heart disease of native coronary artery without angina pectoris: Secondary | ICD-10-CM | POA: Insufficient documentation

## 2022-10-06 DIAGNOSIS — R7881 Bacteremia: Secondary | ICD-10-CM | POA: Diagnosis not present

## 2022-10-06 DIAGNOSIS — E1065 Type 1 diabetes mellitus with hyperglycemia: Secondary | ICD-10-CM | POA: Diagnosis not present

## 2022-10-06 DIAGNOSIS — I429 Cardiomyopathy, unspecified: Secondary | ICD-10-CM

## 2022-10-06 LAB — CULTURE, BLOOD (ROUTINE X 2)
Special Requests: ADEQUATE
Special Requests: ADEQUATE

## 2022-10-06 LAB — BASIC METABOLIC PANEL
Anion gap: 7 (ref 5–15)
BUN: 53 mg/dL — ABNORMAL HIGH (ref 8–23)
CO2: 23 mmol/L (ref 22–32)
Calcium: 8.5 mg/dL — ABNORMAL LOW (ref 8.9–10.3)
Chloride: 110 mmol/L (ref 98–111)
Creatinine, Ser: 1.12 mg/dL (ref 0.61–1.24)
GFR, Estimated: >60 mL/min (ref >60)
Glucose, Bld: 274 mg/dL — ABNORMAL HIGH (ref 70–99)
Potassium: 4.2 mmol/L (ref 3.5–5.1)
Sodium: 140 mmol/L (ref 135–145)

## 2022-10-06 LAB — CBC
HCT: 34.2 % — ABNORMAL LOW (ref 39.0–52.0)
Hemoglobin: 11.4 g/dL — ABNORMAL LOW (ref 13.0–17.0)
MCH: 29.2 pg (ref 26.0–34.0)
MCHC: 33.3 g/dL (ref 30.0–36.0)
MCV: 87.7 fL (ref 80.0–100.0)
Platelets: 459 10*3/uL — ABNORMAL HIGH (ref 150–400)
RBC: 3.9 MIL/uL — ABNORMAL LOW (ref 4.22–5.81)
RDW: 14.2 % (ref 11.5–15.5)
WBC: 15.1 10*3/uL — ABNORMAL HIGH (ref 4.0–10.5)
nRBC: 0 % (ref 0.0–0.2)

## 2022-10-06 LAB — GLUCOSE, CAPILLARY
Glucose-Capillary: 274 mg/dL — ABNORMAL HIGH (ref 70–99)
Glucose-Capillary: 235 mg/dL — ABNORMAL HIGH (ref 70–99)
Glucose-Capillary: 134 mg/dL — ABNORMAL HIGH (ref 70–99)
Glucose-Capillary: 213 mg/dL — ABNORMAL HIGH (ref 70–99)

## 2022-10-06 LAB — MAGNESIUM: Magnesium: 2.3 mg/dL (ref 1.7–2.4)

## 2022-10-06 LAB — PHOSPHORUS: Phosphorus: 1.8 mg/dL — ABNORMAL LOW (ref 2.5–4.6)

## 2022-10-06 MED ORDER — ORAL CARE MOUTH RINSE: 15.0000 mL | OROMUCOSAL | Status: DC | PRN

## 2022-10-06 MED ORDER — K PHOS MONO-SOD PHOS DI & MONO 155-852-130 MG PO TABS
250.0000 mg | ORAL_TABLET | Freq: Three times a day (TID) | ORAL | Status: AC
  Administered 2022-10-06 – 2022-10-07 (×5): 250 mg via ORAL
  Filled 2022-10-06 (×6): qty 1

## 2022-10-06 NOTE — Progress Notes (Signed)
Physical Therapy Treatment Patient Details Name: [name] MRN: [id number] DOB: 12/15/1945 Today's Date: 10/06/2022   History of Present Illness presented to ER and admitted secondary to progressive L knee pain, s/p arthroscopic debridement, partial menisectomy (10/03/22); hospital stay complicated by uncontrolled DM    PT Comments    Arrived at noon for tx per pt request when his partner would be here.  Cardiologist arrived and session needed to be deferred until after lunch.  Returned and pt is agreeable to session but reports general fatigue from activity with nursing.  He is able to stand with min a x 1 but gait is limited today to about 5' with very small short steps.  Static standing prior to sitting for several minutes along with standing to use urinal.  Seated AROM LLE.    Session limited today and TEE planned for tomorrow.  Will try to schedule therapy session around procedure tomorrow.   Recommendations for follow up therapy are one component of a multi-disciplinary discharge planning process, led by the attending physician.  Recommendations may be updated based on patient status, additional functional criteria and insurance authorization.  Follow Up Recommendations  Skilled nursing-short term rehab (<3 hours/day)     Assistance Recommended at Discharge Frequent or constant Supervision/Assistance  Patient can return home with the following A little help with walking and/or transfers;Assistance with cooking/housework;Assist for transportation;Help with stairs or ramp for entrance;A little help with bathing/dressing/bathroom;Direct supervision/assist for medications management   Equipment Recommendations       Recommendations for Other Services       Precautions / Restrictions Precautions Precautions: Fall Restrictions Weight Bearing Restrictions: Yes LLE Weight Bearing: Weight bearing as tolerated     Mobility  Bed Mobility               General bed  mobility comments: in recliner before and after session    Transfers Overall transfer level: Needs assistance Equipment used: Rolling walker (2 wheels) Transfers: Sit to/from Stand Sit to Stand: Min assist, Mod assist           General transfer comment: cuing for hand/foot placement, increased time    Ambulation/Gait Ambulation/Gait assistance: Min assist Gait Distance (Feet): 5 Feet Assistive device: Rolling walker (2 wheels) Gait Pattern/deviations: Antalgic, Step-to pattern, Decreased step length - right, Decreased step length - left, Decreased stance time - left       General Gait Details: limited due to fatigue today   Stairs             Wheelchair Mobility    Modified Rankin (Stroke Patients Only)       Balance Overall balance assessment: Needs assistance Sitting-balance support: No upper extremity supported, Feet supported Sitting balance-Leahy Scale: Good     Standing balance support: Bilateral upper extremity supported, Reliant on assistive device for balance, During functional activity Standing balance-Leahy Scale: Fair                              Cognition Arousal/Alertness: Awake/alert Behavior During Therapy: WFL for tasks assessed/performed Overall Cognitive Status: Impaired/Different from baseline                                          Exercises Other Exercises Other Exercises: seated AROM LLE    General Comments        Pertinent  Vitals/Pain Pain Assessment Pain Assessment: Faces Faces Pain Scale: Hurts even more Pain Location: L knee - with movement Pain Descriptors / Indicators: Aching, Guarding, Discomfort Pain Intervention(s): Limited activity within patient's tolerance, Monitored during session, Repositioned    Home Living                          Prior Function            PT Goals (current goals can now be found in the care plan section) Progress towards PT goals:  Progressing toward goals    Frequency    7X/week      PT Plan Current plan remains appropriate    Co-evaluation              AM-PAC PT "6 Clicks" Mobility   Outcome Measure  Help needed turning from your back to your side while in a flat bed without using bedrails?: None Help needed moving from lying on your back to sitting on the side of a flat bed without using bedrails?: A Little Help needed moving to and from a bed to a chair (including a wheelchair)?: A Little Help needed standing up from a chair using your arms (e.g., wheelchair or bedside chair)?: A Little Help needed to walk in hospital room?: A Lot Help needed climbing 3-5 steps with a railing? : A Lot 6 Click Score: 17    End of Session Equipment Utilized During Treatment: Gait belt Activity Tolerance: Patient limited by pain;Patient limited by fatigue Patient left: in chair;with call bell/phone within reach;with chair alarm set Nurse Communication: Mobility status PT Visit Diagnosis: Muscle weakness (generalized) (M62.81);Difficulty in walking, not elsewhere classified (R26.2);Pain Pain - Right/Left: Left Pain - part of body: Knee     Time: 1330-1355 PT Time Calculation (min) (ACUTE ONLY): 25 min  Charges:  $Gait Training: 8-22 mins $Therapeutic Exercise: 8-22 mins                   Chesley Noon, PTA 10/06/22, 2:12 PM
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
## 2022-10-06 NOTE — Plan of Care (Signed)

## 2022-10-06 NOTE — TOC Progression Note (Signed)
Transition of Care [facility]) - Progression Note    Patient Details  Name: [name] MRN: [id number] Date of Birth: Mar 27, 1946  [facility] CM/SW Contact  Izola Price, RN Phone Number: 10/06/2022, 3:22 PM  Clinical Narrative:  11/16: Patient's partner was to meet with PT session today to see patient progress/needs as he would really like to take patient home. Session was a bit interrupted but patient showed early fatigue while partner present. Partner was to call RN CM after this session to discuss possibilities of private care and [facility] services to be able to take him home. Seem as if they will try this again tomorrow per PT notes. RN CM has not heard from partner today though he has RN CM number.  He stated on 11/25 that patient would only really be alone and need someone with him 4-6 hours a few times a week. Partner is a Customer service manager and has a semi-flexible schedule. TOC to continue to monitor progress and eventual dc plan. If disposition is STR they want it to be close in to patient's [place] address. [name] Davies RN CM     Expected Discharge Plan: [place] Barriers to Discharge: Continued Medical Work up  Expected Discharge Plan and Services Expected Discharge Plan: [place] arrangements for the past 2 months: Single Family Home                                       Social Determinants of Health (SDOH) Interventions    Readmission Risk Interventions     No data to display

## 2022-10-06 NOTE — Consult Note (Addendum)
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 anterior knee soft tissue swelling. There is no acute fracture or dislocation. There are mild degenerative changes of the knee with joint space narrowing and minimal osteophyte formation. Lateral compartment chondrocalcinosis is present. IMPRESSION: 1. No acute fracture or dislocation. 2. Anterior knee soft tissue swelling and joint effusion. 3. Mild degenerative changes of the knee. Electronically Signed   By: Ronney Asters M.D.   On: 10/03/2022 00:08     Assessment and Plan:   MRSA bacteremia -Transthoracic echo with no evidence for endocarditis. -Plan TEE for tomorrow -N.p.o. midnight -Antibiotics as per primary and ID  2.  Cardiomyopathy EF 35% -Likely ischemic with history of CAD/PCI -Appears euvolemic -Coreg 6.25 mg twice daily,  start Entresto 24-26 mg twice daily -Plan right and left heart cath after resolution of bacteremia. -Last blood cultures yesterday with no bacterial growth  3.  CAD/PCI to LAD in 2008 -Denies chest pain -Aspirin, Lipitor -Ischemic workup as above  4.  Left peroneal vein DVT -Management as per medicine team  Total encounter time more than 85 minutes  Greater than 50% was spent in counseling and coordination of care with the patient  Signed, Kate Sable, MD  10/06/2022 1:06 PM

## 2022-10-06 NOTE — Progress Notes (Signed)
PROGRESS NOTE    [name]  GYI:[id number] DOB: [birth year] DOA: 10/02/2022 PCP: [name], MD   Brief Narrative:  This 76 years old male with PMH significant for type 1 diabetes on insulin pump, history of left leg DVT, coronary artery disease s/p stent angioplasty, GERD, history of bladder cancer, history of ureteral stricture s/p dilatation, prior history of MRSA infection presented in the ED for the evaluation of left knee pain which has been there for last 6 days.  Patient reports he has developed left knee pain 6 days ago which has progressively worsened resulting in an inability to bear weight or bend his left knee.  He denies any recent trauma or falls.  He was seen by primary care physician twice and was started on Mobic without any improvement and then he received lidocaine/Kenalog injection 1 day prior to his admission which also shows no improvement in his symptoms.  Patient had an MRI of left knee which shows moderate knee joint effusion with synovial thickening suggesting synovitis. He is also found to have complex tear of posterior horn of medial meniscus. Orthopedics was consulted and patient was taken to the OR for arthroscopy and washout of left knee.  Tolerated well.  Assessment & Plan:   Principal Problem:   Uncontrolled type 1 diabetes mellitus with hyperglycemia, with long-term current use of insulin ([name]) Active Problems:   Septic arthritis of knee, left ([name])   AKI (acute kidney injury) ([place])   DVT (deep venous thrombosis) ([name])   HYPERTENSION, BENIGN   CAD, NATIVE VESSEL   MRSA bacteremia  Septic arthritis of left knee: MRSA Bacteremia: Patient presented for the evaluation of worsening left knee pain and swelling . He was unable to bear weight on left leg. He was noted to have marked leukocytosis which could be related to systemic steroid administration.   MRI concerning for septic arthritis and meniscal tear. Orthopedics was consulted. He underwent  extensive arthroscopic debridement with partial meniscectomy Empirically started on vancomycin and Rocephin. Begin physical therapy for ambulation and gentle range of motion. Infectious diseases consulted, antibiotics changed to daptomycin given worsening renal functions. 2D echocardiogram shows LVEF 35 to 40%.  No vegetations. He will eventually need TEE that will be scheduled. Cardiology consulted for TEE and possible cardiology evaluation for reduced LVEF.  Chronic systolic heart failure: It could be acute or acute on chronic CHF. TTE shows LVEF 35 to 40% reduced left ventricular functions. He appears euvolemic on exam.  Cardiology consulted, awaiting recommendations.  Uncontrolled type 1 diabetes mellitus with hyperglycemia: Most likely related to systemic steroid injection the patient has received for left knee pain. Hold insulin pump for now. Continue gentle fluid resuscitation. Continue Semglee 28 units daily with sliding scale coverage. Carb modified diet. Appreciate Diabetic coordinator consult.   Acute kidney injury : > Resolved. Baseline serum creatinine 1.0, presented in the ED with creatinine of 1.38 Unclear etiology of worsening renal functions but could be related to recent NSAID use. Renal functions improved with IV hydration.  Avoid nephrotoxic medications.  Left peroneal vein DVT: Patient noted to have nonocclusive thrombus seen within the left calf peroneal vein. Continue Eliquis.   Coronary artery disease, native vessel. Stable. Continue Coreg, Lipitor, and aspirin.   Essential hypertension Continue Coreg.   Hypophosphatemia: Replaced.  Continue to monitor.  DVT prophylaxis: Eliquis Code Status: Full code Family Communication: Partner at bedside Disposition Plan:   Status is: Inpatient Remains inpatient appropriate because: Patient admitted for left knee swelling,  found  to have septic arthritis requiring IV antibiotics.   Orthopedics is consulted,   Patient underwent repair of the meniscus tear.  Infectious disease recommended TEE to rule out infective endocarditis.  Plan anticipated discharge to SNF.   Consultants:  Orthopedics  Procedures: Arthroscopy with debridement with partial meniscectomy Antimicrobials: Anti-infectives (From admission, onward)    Start     Dose/Rate Route Frequency Ordered Stop   10/05/22 1200  DAPTOmycin (CUBICIN) 700 mg in sodium chloride 0.9 % IVPB        8 mg/kg  88.5 kg 128 mL/hr over 30 Minutes Intravenous Daily 10/04/22 1550     10/05/22 0800  vancomycin (VANCOREADY) IVPB 1250 mg/250 mL  Status:  Discontinued        1,250 mg 166.7 mL/hr over 90 Minutes Intravenous Every 24 hours 10/04/22 0840 10/04/22 1550   10/04/22 1600  vancomycin (VANCOREADY) IVPB 1250 mg/250 mL  Status:  Discontinued        1,250 mg 166.7 mL/hr over 90 Minutes Intravenous Every 24 hours 10/03/22 1359 10/04/22 0746   10/04/22 0800  vancomycin (VANCOREADY) IVPB 2000 mg/400 mL        2,000 mg 200 mL/hr over 120 Minutes Intravenous  Once 10/04/22 0747 10/04/22 1026   10/03/22 0815  vancomycin (VANCOREADY) IVPB 2000 mg/400 mL  Status:  Discontinued        2,000 mg 200 mL/hr over 120 Minutes Intravenous  Once 10/03/22 0811 10/04/22 0746   10/03/22 0800  cefTRIAXone (ROCEPHIN) 2 g in sodium chloride 0.9 % 100 mL IVPB  Status:  Discontinued        2 g 200 mL/hr over 30 Minutes Intravenous Every 24 hours 10/03/22 0754 10/04/22 0159       Subjective: Patient was seen and examined at bedside.Overnight events noted. Patient still reports having pain in the left knee but has significantly improved. He denies any fever Patient s/p arthroscopic debridement with partial meniscectomy. POD #3.  Objective: Vitals:   10/05/22 0809 10/05/22 1559 10/05/22 2356 10/06/22 0741  BP: 136/70 109/61 (!) 125/59 (!) 147/66  Pulse: 79 77 79 84  Resp: '17 17 16 17  '$ Temp: 97.8 F (36.6 C) 98.1 F (36.7 C) 98.5 F (36.9 C) 98.3 F (36.8 C)   TempSrc:      SpO2:  95% 98% 98%  Weight:      Height:        Intake/Output Summary (Last 24 hours) at 10/06/2022 1114 Last data filed at 10/06/2022 0545 Gross per 24 hour  Intake 1861.6 ml  Output 250 ml  Net 1611.6 ml   Filed Weights   10/02/22 2229  Weight: 88.5 kg    Examination:  General exam: Appears comfortable, not in any acute distress, deconditioned. Respiratory system: CTA bilaterally, respiratory effort normal, RR 13. Cardiovascular system: S1 & S2 heard, regular rate and rhythm, no murmur. Gastrointestinal system: Abdomen is soft, non tender, non distended, BS+ Central nervous system: Alert and oriented x3, no focal neurological deficits. Extremities: Left knee tenderness noted. Left knee covered in compression dressings. Skin: No rashes, lesions or ulcers Psychiatry: Judgement and insight appear normal. Mood & affect appropriate.     Data Reviewed: I have personally reviewed following labs and imaging studies  CBC: Recent Labs  Lab 10/03/22 0658 10/04/22 0459 10/05/22 0500 10/06/22 0528  WBC 20.5* 23.9* 21.7* 15.1*  NEUTROABS 17.1*  --   --   --   HGB 10.5* 11.3* 10.8* 11.4*  HCT 32.0* 33.8* 31.6* 34.2*  MCV 88.6  88.5 87.5 87.7  PLT 398 470* 468* 638*   Basic Metabolic Panel: Recent Labs  Lab 10/03/22 0658 10/04/22 0459 10/05/22 0500 10/06/22 0528  NA 136 137 134* 140  K 4.6 5.3*  5.2* 4.8 4.2  CL 103 102 103 110  CO2 18* 19* 21* 23  GLUCOSE 390* 515* 369* 274*  BUN 59* 72* 76* 53*  CREATININE 1.38* 1.52* 1.67* 1.12  CALCIUM 9.1 8.9 8.5* 8.5*  MG  --   --  2.2 2.3  PHOS  --   --  3.0 1.8*   GFR: Estimated Creatinine Clearance: 62.7 mL/min (by C-G formula based on SCr of 1.12 mg/dL). Liver Function Tests: Recent Labs  Lab 10/03/22 0658 10/05/22 0500  AST 19 14*  ALT 18 13  ALKPHOS 100 100  BILITOT 1.9* 1.1  PROT 6.6 5.7*  ALBUMIN 2.7* 2.2*   No results for input(s): "LIPASE", "AMYLASE" in the last 168 hours. No results  for input(s): "AMMONIA" in the last 168 hours. Coagulation Profile: No results for input(s): "INR", "PROTIME" in the last 168 hours. Cardiac Enzymes: Recent Labs  Lab 10/04/22 0900 10/05/22 0500  CKTOTAL 82 49   BNP (last 3 results) No results for input(s): "PROBNP" in the last 8760 hours. HbA1C: No results for input(s): "HGBA1C" in the last 72 hours.  CBG: Recent Labs  Lab 10/05/22 0813 10/05/22 1131 10/05/22 1652 10/05/22 2124 10/06/22 0757  GLUCAP 385* 379* 250* 186* 274*   Lipid Profile: No results for input(s): "CHOL", "HDL", "LDLCALC", "TRIG", "CHOLHDL", "LDLDIRECT" in the last 72 hours. Thyroid Function Tests: No results for input(s): "TSH", "T4TOTAL", "FREET4", "T3FREE", "THYROIDAB" in the last 72 hours. Anemia Panel: No results for input(s): "VITAMINB12", "FOLATE", "FERRITIN", "TIBC", "IRON", "RETICCTPCT" in the last 72 hours. Sepsis Labs: Recent Labs  Lab 10/03/22 0906 10/03/22 1500  LATICACIDVEN 1.9 1.6    Recent Results (from the past 240 hour(s))  Blood Culture (routine x 2)     Status: Abnormal   Collection Time: 10/03/22  9:06 AM   Specimen: BLOOD  Result Value Ref Range Status   Specimen Description   Final    BLOOD RIGHT AC Performed at [facility], [address], [place], [place] [address]    Special Requests   Final    BOTTLES DRAWN AEROBIC AND ANAEROBIC Blood Culture adequate volume Performed at [facility], [address] [place], [place] [address]    Culture  Setup Time   Final    GRAM POSITIVE COCCI IN BOTH AEROBIC AND ANAEROBIC BOTTLES CRITICAL RESULT CALLED TO, READ BACK BY AND VERIFIED WITH: Violeta Gelinas PHARMD AT 8841 10/04/2022 GAA    Culture METHICILLIN RESISTANT STAPHYLOCOCCUS AUREUS (A)  Final   Report Status 10/06/2022 FINAL  Final   Organism ID, Bacteria METHICILLIN RESISTANT STAPHYLOCOCCUS AUREUS  Final      Susceptibility   Methicillin resistant staphylococcus aureus - MIC*    CIPROFLOXACIN >=8  RESISTANT Resistant     ERYTHROMYCIN >=8 RESISTANT Resistant     GENTAMICIN <=0.5 SENSITIVE Sensitive     OXACILLIN >=4 RESISTANT Resistant     TETRACYCLINE <=1 SENSITIVE Sensitive     VANCOMYCIN 1 SENSITIVE Sensitive     TRIMETH/SULFA <=10 SENSITIVE Sensitive     CLINDAMYCIN <=0.25 SENSITIVE Sensitive     RIFAMPIN <=0.5 SENSITIVE Sensitive     Inducible Clindamycin NEGATIVE Sensitive     * METHICILLIN RESISTANT STAPHYLOCOCCUS AUREUS  Blood Culture (routine x 2)     Status: Abnormal   Collection  Time: 10/03/22  9:06 AM   Specimen: BLOOD  Result Value Ref Range Status   Specimen Description   Final    BLOOD RIGTH [facility] Performed at [facility], [address] [place], [place] [address]    Special Requests   Final    BOTTLES DRAWN AEROBIC AND ANAEROBIC Blood Culture adequate volume Performed at [facility], [address] [place], [place] [address]    Culture  Setup Time   Final    GRAM POSITIVE COCCI IN BOTH AEROBIC AND ANAEROBIC BOTTLES GRAM STAIN REVIEWED-AGREE WITH RESULT CRITICAL VALUE NOTED.  VALUE IS CONSISTENT WITH PREVIOUSLY REPORTED AND CALLED VALUE. Performed at [facility], [address] [place], [place] [address]    Culture (A)  Final    STAPHYLOCOCCUS AUREUS SUSCEPTIBILITIES PERFORMED ON PREVIOUS CULTURE WITHIN THE LAST 5 DAYS. Performed at [facility], [place] [address] [place], [place] [address]    Report Status 10/06/2022 FINAL  Final  Blood Culture ID Panel (Reflexed)     Status: Abnormal   Collection Time: 10/03/22  9:06 AM  Result Value Ref Range Status   Enterococcus faecalis NOT DETECTED NOT DETECTED Final   Enterococcus Faecium NOT DETECTED NOT DETECTED Final   Listeria monocytogenes NOT DETECTED NOT DETECTED Final   Staphylococcus species DETECTED (A) NOT DETECTED Final    Comment: CRITICAL RESULT CALLED TO, READ BACK BY AND VERIFIED WITH: Violeta Gelinas PHARMD AT 0150 10/04/2022 GAA    Staphylococcus aureus  (BCID) DETECTED (A) NOT DETECTED Final    Comment: Methicillin (oxacillin)-resistant Staphylococcus aureus (MRSA). MRSA is predictably resistant to beta-lactam antibiotics (except ceftaroline). Preferred therapy is vancomycin unless clinically contraindicated. Patient requires contact precautions if  hospitalized. CRITICAL RESULT CALLED TO, READ BACK BY AND VERIFIED WITH: Violeta Gelinas PHARMD AT 1275 10/04/2022 GAA    Staphylococcus epidermidis NOT DETECTED NOT DETECTED Final   Staphylococcus lugdunensis NOT DETECTED NOT DETECTED Final   Streptococcus species NOT DETECTED NOT DETECTED Final   Streptococcus agalactiae NOT DETECTED NOT DETECTED Final   Streptococcus pneumoniae NOT DETECTED NOT DETECTED Final   Streptococcus pyogenes NOT DETECTED NOT DETECTED Final   A.calcoaceticus-baumannii NOT DETECTED NOT DETECTED Final   Bacteroides fragilis NOT DETECTED NOT DETECTED Final   Enterobacterales NOT DETECTED NOT DETECTED Final   Enterobacter cloacae complex NOT DETECTED NOT DETECTED Final   Escherichia coli NOT DETECTED NOT DETECTED Final   Klebsiella aerogenes NOT DETECTED NOT DETECTED Final   Klebsiella oxytoca NOT DETECTED NOT DETECTED Final   Klebsiella pneumoniae NOT DETECTED NOT DETECTED Final   Proteus species NOT DETECTED NOT DETECTED Final   Salmonella species NOT DETECTED NOT DETECTED Final   Serratia marcescens NOT DETECTED NOT DETECTED Final   Haemophilus influenzae NOT DETECTED NOT DETECTED Final   Neisseria meningitidis NOT DETECTED NOT DETECTED Final   Pseudomonas aeruginosa NOT DETECTED NOT DETECTED Final   Stenotrophomonas maltophilia NOT DETECTED NOT DETECTED Final   Candida albicans NOT DETECTED NOT DETECTED Final   Candida auris NOT DETECTED NOT DETECTED Final   Candida glabrata NOT DETECTED NOT DETECTED Final   Candida krusei NOT DETECTED NOT DETECTED Final   Candida parapsilosis NOT DETECTED NOT DETECTED Final   Candida tropicalis NOT DETECTED NOT DETECTED Final    Cryptococcus neoformans/gattii NOT DETECTED NOT DETECTED Final   Meth resistant mecA/C and MREJ DETECTED (A) NOT DETECTED Final    Comment: CRITICAL RESULT CALLED TO, READ BACK BY AND VERIFIED WITHVioleta Gelinas PHARMD AT 0150 10/04/2022 GAA Performed at  [place], [place] [address]   Aerobic/Anaerobic Culture w Gram Stain (surgical/deep wound)     Status: None (Preliminary result)   Collection Time: 10/03/22 11:45 AM   Specimen: PATH Cytology Misc. fluid; Body Fluid  Result Value Ref Range Status   Specimen Description   Final    KNEE LEFT Performed at [facility], [address], [place], [place] [address]    Special Requests   Final    NONE Performed at [facility], [address] [place], [place] [address]    Gram Stain   Final    ABUNDANT WBC PRESENT, PREDOMINANTLY PMN MODERATE GRAM POSITIVE COCCI IN PAIRS IN CLUSTERS Gram Stain Report Called to,Read Back By and Verified With:  Doretha Sou MCWHITE, RN 10/04/22 0100 A. LAFRANCE    Culture   Final    ABUNDANT STAPHYLOCOCCUS AUREUS SUSCEPTIBILITIES TO FOLLOW Performed at [facility], [place] [address] [place], [place] [address]    Report Status PENDING  Incomplete  Culture, blood (Routine X 2) w Reflex to ID Panel     Status: None (Preliminary result)   Collection Time: 10/05/22  4:59 AM   Specimen: BLOOD RIGHT WRIST  Result Value Ref Range Status   Specimen Description BLOOD RIGHT WRIST  Final   Special Requests Blood Culture adequate volume  Final   Culture   Final    NO GROWTH < 24 HOURS Performed at [facility], [address], [place], [place] [address]    Report Status PENDING  Incomplete  Culture, blood (Routine X 2) w Reflex to ID Panel     Status: None (Preliminary result)   Collection Time: 10/05/22  7:04 AM   Specimen: BLOOD LEFT HAND  Result Value Ref Range Status   Specimen Description BLOOD LEFT HAND  Final   Special Requests Blood  Culture adequate volume  Final   Culture   Final    NO GROWTH < 24 HOURS Performed at [facility], [address], [place], [place] [address]    Report Status PENDING  Incomplete         Radiology Studies: ECHOCARDIOGRAM COMPLETE  Result Date: 10/05/2022    ECHOCARDIOGRAM REPORT   Patient Name:   RAMESH MOAN Runyon Date of Exam: 10/05/2022 Medical Rec #:  [id number]            Height:       69.0 in Accession #:    [id number]           Weight:       195.0 lb Date of Birth:  1946/07/21            BSA:          2.044 m Patient Age:    62 years             BP:           125/58 mmHg Patient Gender: M                    HR:           80 bpm. Exam Location:  [facility] Procedure: 2D Echo and Intracardiac Opacification Agent Indications:     Bacteremia R78.81  History:         Patient has no prior history of Echocardiogram examinations.  Sonographer:     Kathlen Brunswick RDCS Referring Phys:  [id number] Tsosie Billing Diagnosing Phys: Serafina Royals MD  Sonographer Comments:  Technically difficult study due to poor echo windows and suboptimal subcostal window. The study was performed while patient was positioned supine, unable to turn at this time. IMPRESSIONS  1. Left ventricular ejection fraction, by estimation, is 35 to 40%. The left ventricle has moderately decreased function. The left ventricle demonstrates regional wall motion abnormalities (see scoring diagram/findings for description). The left ventricular internal cavity size was mildly dilated. Left ventricular diastolic parameters were normal.  2. Right ventricular systolic function is normal. The right ventricular size is normal.  3. Left atrial size was mildly dilated.  4. The mitral valve is normal in structure. Mild mitral valve regurgitation.  5. The aortic valve is normal in structure. Aortic valve regurgitation is trivial. FINDINGS  Left Ventricle: Left ventricular ejection fraction, by estimation, is 35 to 40%. The left ventricle  has moderately decreased function. The left ventricle demonstrates regional wall motion abnormalities. Severe akinesis of the left ventricular, apical apical segment. Definity contrast agent was given IV to delineate the left ventricular endocardial borders. The left ventricular internal cavity size was mildly dilated. There is no left ventricular hypertrophy. Left ventricular diastolic parameters were  normal. Right Ventricle: The right ventricular size is normal. No increase in right ventricular wall thickness. Right ventricular systolic function is normal. Left Atrium: Left atrial size was mildly dilated. Right Atrium: Right atrial size was normal in size. Pericardium: There is no evidence of pericardial effusion. Mitral Valve: The mitral valve is normal in structure. Mild mitral valve regurgitation. There is no evidence of mitral valve vegetation. Tricuspid Valve: The tricuspid valve is normal in structure. Tricuspid valve regurgitation is mild. There is no evidence of tricuspid valve vegetation. Aortic Valve: The aortic valve is normal in structure. Aortic valve regurgitation is trivial. Aortic valve peak gradient measures 5.7 mmHg. There is no evidence of aortic valve vegetation. Pulmonic Valve: The pulmonic valve was normal in structure. Pulmonic valve regurgitation is trivial. Aorta: The aortic root and ascending aorta are structurally normal, with no evidence of dilitation. IAS/Shunts: No atrial level shunt detected by color flow Doppler.  LEFT VENTRICLE PLAX 2D LVIDd:         6.30 cm      Diastology LVIDs:         4.60 cm      LV e' medial:    8.05 cm/s LV PW:         1.30 cm      LV E/e' medial:  9.2 LV IVS:        0.90 cm      LV e' lateral:   11.70 cm/s LVOT diam:     2.10 cm      LV E/e' lateral: 6.3 LV SV:         69 LV SV Index:   34 LVOT Area:     3.46 cm  LV Volumes (MOD) LV vol d, MOD A2C: 157.0 ml LV vol d, MOD A4C: 106.6 ml LV vol s, MOD A2C: 80.3 ml LV vol s, MOD A4C: 64.4 ml LV SV MOD A2C:      76.7 ml LV SV MOD A4C:     106.6 ml LV SV MOD BP:      61.3 ml RIGHT VENTRICLE RV Basal diam:  2.90 cm RV S prime:     12.50 cm/s TAPSE (M-mode): 2.3 cm LEFT ATRIUM           Index        RIGHT ATRIUM  Index LA diam:      4.30 cm 2.10 cm/m   RA Area:     13.60 cm LA Vol (A2C): 72.8 ml 35.62 ml/m  RA Volume:   31.10 ml  15.22 ml/m LA Vol (A4C): 60.9 ml 29.80 ml/m  AORTIC VALVE                 PULMONIC VALVE AV Area (Vmax): 2.25 cm     PV Vmax:       0.85 m/s AV Vmax:        119.00 cm/s  PV Peak grad:  2.9 mmHg AV Peak Grad:   5.7 mmHg LVOT Vmax:      77.40 cm/s LVOT Vmean:     55.500 cm/s LVOT VTI:       0.200 m  AORTA Ao Root diam: 3.90 cm MITRAL VALVE MV Area (PHT): 4.26 cm    SHUNTS MV Decel Time: 178 msec    Systemic VTI:  0.20 m MV E velocity: 73.90 cm/s  Systemic Diam: 2.10 cm MV A velocity: 68.10 cm/s MV E/A ratio:  1.09 Serafina Royals MD Electronically signed by Serafina Royals MD Signature Date/Time: 10/05/2022/11:16:56 AM    Final     Scheduled Meds:  aspirin EC  81 mg Oral Daily   atorvastatin  40 mg Oral Daily   carvedilol  6.25 mg Oral BID WC   cyanocobalamin  1,000 mcg Oral QODAY   docusate sodium  200 mg Oral BID   enoxaparin (LOVENOX) injection  40 mg Subcutaneous Q24H   famotidine  20 mg Oral BID   insulin aspart  0-20 Units Subcutaneous TID WC   insulin aspart  4 Units Subcutaneous TID WC   insulin glargine-yfgn  28 Units Subcutaneous Daily   multivitamin with minerals  1 tablet Oral Daily   phosphorus  250 mg Oral TID   senna  1 tablet Oral BID   Continuous Infusions:  sodium chloride 75 mL/hr at 10/06/22 0103   DAPTOmycin (CUBICIN) 700 mg in sodium chloride 0.9 % IVPB Stopped (10/05/22 1359)     LOS: 3 days     Time spent: 35 mins    Ayrabella Labombard, MD [facility]   If 7PM-7AM, please contact night-coverage

## 2022-10-06 NOTE — Inpatient Diabetes Management (Signed)
Inpatient Diabetes Program Recommendations  AACE/ADA: New Consensus Statement on Inpatient Glycemic Control (2015)  Target Ranges:  Prepandial:   less than 140 mg/dL      Peak postprandial:   less than 180 mg/dL (1-2 hours)      Critically ill patients:  140 - 180 mg/dL   Lab Results  Component Value Date   GLUCAP 274 (H) 10/06/2022   HGBA1C 7.3 (H) 10/03/2022    Review of Glycemic Control  Diabetes history: DM1  Outpatient Diabetes medications:  Basal Correction Carb ratio Target BG 12 AM 0.85 unit/hr 40 1:20 120 4 AM 0.75 unit/hr 40 1:13 120 7:30 AM 1.1 unit/hr 40 1:13 120 4 PM 1.2 unit/hr 40 1:18 120 24-hr basal = 25 units Duration of insulin = 5 hrs   Current orders for Inpatient glycemic control:  Semglee 28 units QD (increased today from 24 units) Novolog 4 units TID Novolog 0-20 units TID and HS  Inpatient Diabetes Program Recommendations:    Per pump settings, he is getting between 13-20 units of meal coverage TID.  Please consider:  Novolog 8 units TID with meals if he consumes at least 50%.  Will continue to follow while inpatient.  Thank you, Reche Dixon, MSN, [place] Diabetes Coordinator Inpatient Diabetes Program [phone] (team pager from 8a-5p)

## 2022-10-07 ENCOUNTER — Inpatient Hospital Stay (HOSPITAL_COMMUNITY)
Admit: 2022-10-07 | Discharge: 2022-10-07 | Disposition: A | Discharge status: Home / Self Care | Payer: Medicare HMO | Attending: Cardiology | Admitting: Cardiology

## 2022-10-07 ENCOUNTER — Encounter
Admission: EM | Disposition: A | Discharge status: Skilled Nursing Facility | Payer: Self-pay | Source: Home / Self Care | Attending: Family Medicine

## 2022-10-07 ENCOUNTER — Encounter: Payer: Self-pay | Admitting: Internal Medicine

## 2022-10-07 ENCOUNTER — Inpatient Hospital Stay: Payer: Medicare HMO | Admitting: Anesthesiology

## 2022-10-07 DIAGNOSIS — I2510 Atherosclerotic heart disease of native coronary artery without angina pectoris: Secondary | ICD-10-CM | POA: Diagnosis not present

## 2022-10-07 DIAGNOSIS — R7881 Bacteremia: Secondary | ICD-10-CM

## 2022-10-07 DIAGNOSIS — I10 Essential (primary) hypertension: Secondary | ICD-10-CM | POA: Diagnosis not present

## 2022-10-07 DIAGNOSIS — E785 Hyperlipidemia, unspecified: Secondary | ICD-10-CM

## 2022-10-07 DIAGNOSIS — E1065 Type 1 diabetes mellitus with hyperglycemia: Secondary | ICD-10-CM | POA: Diagnosis not present

## 2022-10-07 DIAGNOSIS — I429 Cardiomyopathy, unspecified: Secondary | ICD-10-CM | POA: Diagnosis not present

## 2022-10-07 HISTORY — PX: INCISION AND DRAINAGE: SHX5863

## 2022-10-07 HISTORY — PX: TEE WITHOUT CARDIOVERSION: SHX5443

## 2022-10-07 LAB — GLUCOSE, CAPILLARY
Glucose-Capillary: 375 mg/dL — ABNORMAL HIGH (ref 70–99)
Glucose-Capillary: 202 mg/dL — ABNORMAL HIGH (ref 70–99)
Glucose-Capillary: 219 mg/dL — ABNORMAL HIGH (ref 70–99)
Glucose-Capillary: 231 mg/dL — ABNORMAL HIGH (ref 70–99)
Glucose-Capillary: 252 mg/dL — ABNORMAL HIGH (ref 70–99)
Glucose-Capillary: 228 mg/dL — ABNORMAL HIGH (ref 70–99)
Glucose-Capillary: 287 mg/dL — ABNORMAL HIGH (ref 70–99)
Glucose-Capillary: 297 mg/dL — ABNORMAL HIGH (ref 70–99)

## 2022-10-07 SURGERY — INCISION AND DRAINAGE
Anesthesia: General | Laterality: Left

## 2022-10-07 SURGERY — ECHOCARDIOGRAM, TRANSESOPHAGEAL
Anesthesia: Moderate Sedation

## 2022-10-07 MED ORDER — MIDAZOLAM HCL 2 MG/2ML IJ SOLN
INTRAMUSCULAR | Status: AC | PRN
  Administered 2022-10-07 (×2): 1 mg via INTRAVENOUS

## 2022-10-07 MED ORDER — PROPOFOL 10 MG/ML IV BOLUS
INTRAVENOUS | Status: AC
  Filled 2022-10-07: qty 40

## 2022-10-07 MED ORDER — DEXMEDETOMIDINE HCL IN NACL 80 MCG/20ML IV SOLN
INTRAVENOUS | Status: DC | PRN
  Administered 2022-10-07: 8 ug via BUCCAL

## 2022-10-07 MED ORDER — ACETAMINOPHEN 325 MG PO TABS
325.0000 mg | ORAL_TABLET | Freq: Four times a day (QID) | ORAL | Status: DC | PRN
  Administered 2022-10-10 – 2022-10-13 (×4): 650 mg via ORAL
  Filled 2022-10-07 (×5): qty 2

## 2022-10-07 MED ORDER — APIXABAN 5 MG PO TABS: 10.0000 mg | ORAL_TABLET | Freq: Two times a day (BID) | ORAL | Status: DC

## 2022-10-07 MED ORDER — KETOROLAC TROMETHAMINE 15 MG/ML IJ SOLN
7.5000 mg | Freq: Four times a day (QID) | INTRAMUSCULAR | Status: AC
  Administered 2022-10-07 – 2022-10-08 (×4): 7.5 mg via INTRAVENOUS
  Filled 2022-10-07 (×4): qty 1

## 2022-10-07 MED ORDER — FENTANYL CITRATE (PF) 100 MCG/2ML IJ SOLN
INTRAMUSCULAR | Status: AC
  Filled 2022-10-07: qty 2

## 2022-10-07 MED ORDER — FENTANYL CITRATE (PF) 100 MCG/2ML IJ SOLN
INTRAMUSCULAR | Status: DC | PRN
  Administered 2022-10-07: 25 ug via INTRAVENOUS
  Administered 2022-10-07 (×2): 50 ug via INTRAVENOUS
  Administered 2022-10-07 (×3): 25 ug via INTRAVENOUS
  Administered 2022-10-07 (×2): 50 ug via INTRAVENOUS

## 2022-10-07 MED ORDER — K PHOS MONO-SOD PHOS DI & MONO 155-852-130 MG PO TABS: 250.0000 mg | ORAL_TABLET | Freq: Three times a day (TID) | ORAL | Status: DC

## 2022-10-07 MED ORDER — BUPIVACAINE-EPINEPHRINE (PF) 0.5% -1:200000 IJ SOLN
INTRAMUSCULAR | Status: AC
  Filled 2022-10-07: qty 30

## 2022-10-07 MED ORDER — MAGNESIUM HYDROXIDE 400 MG/5ML PO SUSP: 30.0000 mL | Freq: Every day | ORAL | Status: DC | PRN

## 2022-10-07 MED ORDER — LIDOCAINE VISCOUS HCL 2 % MT SOLN
OROMUCOSAL | Status: AC
  Administered 2022-10-07: 15 mL
  Filled 2022-10-07: qty 15

## 2022-10-07 MED ORDER — APIXABAN 5 MG PO TABS: 5.0000 mg | ORAL_TABLET | Freq: Two times a day (BID) | ORAL | Status: DC

## 2022-10-07 MED ORDER — FENTANYL CITRATE (PF) 100 MCG/2ML IJ SOLN
INTRAMUSCULAR | Status: AC | PRN
  Administered 2022-10-07: 25 ug via INTRAVENOUS

## 2022-10-07 MED ORDER — METOCLOPRAMIDE HCL 5 MG PO TABS: 5.0000 mg | ORAL_TABLET | Freq: Three times a day (TID) | ORAL | Status: DC | PRN

## 2022-10-07 MED ORDER — APIXABAN 5 MG PO TABS
10.0000 mg | ORAL_TABLET | Freq: Two times a day (BID) | ORAL | Status: AC
  Administered 2022-10-07 – 2022-10-14 (×14): 10 mg via ORAL
  Filled 2022-10-07 (×14): qty 2

## 2022-10-07 MED ORDER — PHENYLEPHRINE HCL-NACL 20-0.9 MG/250ML-% IV SOLN
INTRAVENOUS | Status: AC
  Filled 2022-10-07: qty 250

## 2022-10-07 MED ORDER — NEOMYCIN-POLYMYXIN B GU 40-200000 IR SOLN
Status: AC
  Filled 2022-10-07: qty 20

## 2022-10-07 MED ORDER — NOREPINEPHRINE BITARTRATE 1 MG/ML IV SOLN
INTRAVENOUS | Status: AC
  Filled 2022-10-07: qty 4

## 2022-10-07 MED ORDER — METOCLOPRAMIDE HCL 5 MG/ML IJ SOLN: 5.0000 mg | Freq: Three times a day (TID) | INTRAMUSCULAR | Status: DC | PRN

## 2022-10-07 MED ORDER — APIXABAN 5 MG PO TABS
10.0000 mg | ORAL_TABLET | Freq: Two times a day (BID) | ORAL | Status: DC
  Filled 2022-10-07: qty 2

## 2022-10-07 MED ORDER — LIDOCAINE HCL (CARDIAC) PF 100 MG/5ML IV SOSY
PREFILLED_SYRINGE | INTRAVENOUS | Status: DC | PRN
  Administered 2022-10-07: 100 mg via INTRAVENOUS

## 2022-10-07 MED ORDER — ONDANSETRON HCL 4 MG PO TABS
4.0000 mg | ORAL_TABLET | Freq: Four times a day (QID) | ORAL | Status: DC | PRN
  Administered 2022-10-12: 4 mg via ORAL
  Filled 2022-10-07: qty 1

## 2022-10-07 MED ORDER — BISACODYL 10 MG RE SUPP: 10.0000 mg | Freq: Every day | RECTAL | Status: DC | PRN

## 2022-10-07 MED ORDER — NITROGLYCERIN IN D5W 200-5 MCG/ML-% IV SOLN
INTRAVENOUS | Status: AC
  Filled 2022-10-07: qty 250

## 2022-10-07 MED ORDER — DIPHENHYDRAMINE HCL 12.5 MG/5ML PO ELIX: 12.5000 mg | ORAL_SOLUTION | ORAL | Status: DC | PRN

## 2022-10-07 MED ORDER — MIDAZOLAM HCL 2 MG/2ML IJ SOLN
INTRAMUSCULAR | Status: AC
  Filled 2022-10-07: qty 4

## 2022-10-07 MED ORDER — ACETAMINOPHEN 500 MG PO TABS
1000.0000 mg | ORAL_TABLET | Freq: Four times a day (QID) | ORAL | Status: AC
  Administered 2022-10-07 – 2022-10-08 (×4): 1000 mg via ORAL
  Filled 2022-10-07 (×4): qty 2

## 2022-10-07 MED ORDER — PROPOFOL 10 MG/ML IV BOLUS
INTRAVENOUS | Status: DC | PRN
  Administered 2022-10-07: 50 mg via INTRAVENOUS
  Administered 2022-10-07: 100 mg via INTRAVENOUS
  Administered 2022-10-07: 50 mg via INTRAVENOUS

## 2022-10-07 MED ORDER — ONDANSETRON HCL 4 MG/2ML IJ SOLN
4.0000 mg | Freq: Four times a day (QID) | INTRAMUSCULAR | Status: DC | PRN
  Administered 2022-10-11: 4 mg via INTRAVENOUS
  Filled 2022-10-07: qty 2

## 2022-10-07 MED ORDER — LACTATED RINGERS IV SOLN: INTRAVENOUS | Status: DC | PRN

## 2022-10-07 MED ORDER — FLEET ENEMA 7-19 GM/118ML RE ENEM: 1.0000 | ENEMA | Freq: Once | RECTAL | Status: DC | PRN

## 2022-10-07 MED ORDER — SODIUM CHLORIDE 0.9 % IV SOLN
Status: DC | PRN
  Administered 2022-10-07: 3012 mL

## 2022-10-07 MED ORDER — LIDOCAINE HCL (PF) 2 % IJ SOLN
INTRAMUSCULAR | Status: AC
  Filled 2022-10-07: qty 5

## 2022-10-07 MED ORDER — BUPIVACAINE-EPINEPHRINE 0.5% -1:200000 IJ SOLN
INTRAMUSCULAR | Status: DC | PRN
  Administered 2022-10-07: 20 mL

## 2022-10-07 MED ORDER — HYDROMORPHONE HCL 1 MG/ML IJ SOLN: 0.2500 mg | INTRAMUSCULAR | Status: DC | PRN

## 2022-10-07 MED ORDER — PHENYLEPHRINE HCL-NACL 20-0.9 MG/250ML-% IV SOLN
INTRAVENOUS | Status: DC | PRN
  Administered 2022-10-07: 20 ug/min via INTRAVENOUS

## 2022-10-07 MED ORDER — OXYCODONE HCL 5 MG PO TABS: 5.0000 mg | ORAL_TABLET | Freq: Once | ORAL | Status: DC | PRN

## 2022-10-07 MED ORDER — DOCUSATE SODIUM 100 MG PO CAPS: 100.0000 mg | ORAL_CAPSULE | Freq: Two times a day (BID) | ORAL | Status: DC

## 2022-10-07 MED ORDER — SODIUM CHLORIDE 0.9 % IV SOLN: INTRAVENOUS | Status: DC

## 2022-10-07 MED ORDER — SACUBITRIL-VALSARTAN 24-26 MG PO TABS
1.0000 | ORAL_TABLET | Freq: Two times a day (BID) | ORAL | Status: DC
  Administered 2022-10-07 – 2022-10-14 (×13): 1 via ORAL
  Filled 2022-10-07 (×15): qty 1

## 2022-10-07 MED ORDER — PHENYLEPHRINE 80 MCG/ML (10ML) SYRINGE FOR IV PUSH (FOR BLOOD PRESSURE SUPPORT)
PREFILLED_SYRINGE | INTRAVENOUS | Status: DC | PRN
  Administered 2022-10-07 (×2): 40 ug via INTRAVENOUS
  Administered 2022-10-07: 80 ug via INTRAVENOUS

## 2022-10-07 MED ORDER — OXYCODONE HCL 5 MG/5ML PO SOLN: 5.0000 mg | Freq: Once | ORAL | Status: DC | PRN

## 2022-10-07 MED ORDER — SODIUM CHLORIDE 0.9 % IR SOLN
Status: DC | PRN
  Administered 2022-10-07 (×2): 3000 mL

## 2022-10-07 MED ORDER — BUPIVACAINE HCL (PF) 0.5 % IJ SOLN
INTRAMUSCULAR | Status: AC
  Filled 2022-10-07: qty 30

## 2022-10-07 MED ORDER — ONDANSETRON HCL 4 MG/2ML IJ SOLN
INTRAMUSCULAR | Status: AC
  Filled 2022-10-07: qty 2

## 2022-10-07 SURGICAL SUPPLY — 47 items
APL PRP STRL LF DISP 70% ISPRP (MISCELLANEOUS) ×2
BLADE FULL RADIUS 3.5 (BLADE) IMPLANT
BLADE SURG SZ10 CARB STEEL (BLADE) ×1 IMPLANT
BNDG CMPR 5X4 CHSV STRCH STRL (GAUZE/BANDAGES/DRESSINGS) ×1
BNDG COHESIVE 4X5 TAN STRL LF (GAUZE/BANDAGES/DRESSINGS) ×1 IMPLANT
BNDG ESMARK 4X12 TAN STRL LF (GAUZE/BANDAGES/DRESSINGS) ×1 IMPLANT
CHLORAPREP W/TINT 26 (MISCELLANEOUS) ×1 IMPLANT
CUFF TOURN SGL QUICK 18X4 (TOURNIQUET CUFF) IMPLANT
CUFF TOURN SGL QUICK 24 (TOURNIQUET CUFF)
CUFF TRNQT CYL 24X4X16.5-23 (TOURNIQUET CUFF) IMPLANT
ELECT REM PT RETURN 9FT ADLT (ELECTROSURGICAL) ×1
ELECTRODE REM PT RTRN 9FT ADLT (ELECTROSURGICAL) ×1 IMPLANT
GAUZE SPONGE 4X4 12PLY STRL (GAUZE/BANDAGES/DRESSINGS) IMPLANT
GLOVE BIO SURGEON STRL SZ8 (GLOVE) ×1 IMPLANT
GLOVE SURG ORTHO 8.5 STRL (GLOVE) ×1 IMPLANT
GLOVE SURG UNDER LTX SZ8 (GLOVE) ×1 IMPLANT
GOWN STRL REUS W/ TWL LRG LVL3 (GOWN DISPOSABLE) ×1 IMPLANT
GOWN STRL REUS W/ TWL XL LVL3 (GOWN DISPOSABLE) ×1 IMPLANT
GOWN STRL REUS W/TWL LRG LVL3 (GOWN DISPOSABLE) ×1
GOWN STRL REUS W/TWL XL LVL3 (GOWN DISPOSABLE) ×1
HEMOVAC LRG 3/16 (MISCELLANEOUS) IMPLANT
IV NS IRRIG 3000ML ARTHROMATIC (IV SOLUTION) IMPLANT
KIT TURNOVER KIT A (KITS) ×1 IMPLANT
LABEL OR SOLS (LABEL) ×1 IMPLANT
MANIFOLD NEPTUNE II (INSTRUMENTS) ×1 IMPLANT
NDL SAFETY ECLIP 18X1.5 (MISCELLANEOUS) ×1 IMPLANT
NS IRRIG 1000ML POUR BTL (IV SOLUTION) ×1 IMPLANT
PACK EXTREMITY ARMC (MISCELLANEOUS) ×1 IMPLANT
PAD ABD DERMACEA PRESS 5X9 (GAUZE/BANDAGES/DRESSINGS) IMPLANT
PAD CAST 4YDX4 CTTN HI CHSV (CAST SUPPLIES) ×1 IMPLANT
PADDING CAST COTTON 4X4 STRL (CAST SUPPLIES)
SPLINT CAST 1 STEP 3X12 (MISCELLANEOUS) ×1 IMPLANT
SPONGE T-LAP 18X18 ~~LOC~~+RFID (SPONGE) ×1 IMPLANT
STAPLER SKIN PROX 35W (STAPLE) ×1 IMPLANT
STOCKINETTE BIAS CUT 4 980044 (GAUZE/BANDAGES/DRESSINGS) ×1 IMPLANT
STOCKINETTE IMPERVIOUS 9X36 MD (GAUZE/BANDAGES/DRESSINGS) ×1 IMPLANT
SUT ETHILON 4-0 (SUTURE) ×1
SUT ETHILON 4-0 FS2 18XMFL BLK (SUTURE) ×1
SUT PROLENE 4 0 PS 2 18 (SUTURE) IMPLANT
SUT VIC AB 4-0 SH 27 (SUTURE)
SUT VIC AB 4-0 SH 27XANBCTRL (SUTURE) ×1 IMPLANT
SUT VICRYL+ 3-0 36IN CT-1 (SUTURE) ×1 IMPLANT
SUTURE ETHLN 4-0 FS2 18XMF BLK (SUTURE) ×1 IMPLANT
SYR 10ML LL (SYRINGE) ×1 IMPLANT
TRAP FLUID SMOKE EVACUATOR (MISCELLANEOUS) ×1 IMPLANT
WAND WEREWOLF FLOW 90D (MISCELLANEOUS) IMPLANT
WATER STERILE IRR 500ML POUR (IV SOLUTION) ×1 IMPLANT

## 2022-10-07 NOTE — TOC Progression Note (Signed)
Transition of Care [facility]) - Progression Note    Patient Details  Name: DUVAL MACLEOD MRN: [id number] Date of Birth: May 19, 1946  Transition of Care [facility]) CM/SW [facility], RN Phone Number: 10/07/2022, 11:02 AM  Clinical Narrative:    The patient plans to return home with his partner TEE to be done today I&D done later this afternoon as well TOC to follow for DC planning and assist with needs   Expected Discharge Plan: [place] Barriers to Discharge: Continued Medical Work up  Expected Discharge Plan and Services Expected Discharge Plan: [place] arrangements for the past 2 months: Single Family Home                                       Social Determinants of Health (SDOH) Interventions    Readmission Risk Interventions     No data to display

## 2022-10-07 NOTE — Anesthesia Procedure Notes (Signed)
Procedure Name: LMA Insertion Date/Time: 10/07/2022 3:58 PM  Performed by: Adalberto Ill, CRNAPre-anesthesia Checklist: Patient identified, Emergency Drugs available, Suction available, Patient being monitored and Timeout performed Patient Re-evaluated:Patient Re-evaluated prior to induction Oxygen Delivery Method: Circle system utilized Preoxygenation: Pre-oxygenation with 100% oxygen Induction Type: IV induction Ventilation: Mask ventilation without difficulty LMA: LMA inserted Tube type: Oral

## 2022-10-07 NOTE — Anesthesia Postprocedure Evaluation (Signed)
Anesthesia Post Note  Patient: [name]  Procedure(s) Performed: ARTHROSCOPIC INCISION AND DRAINAGE (Left)  Patient location during evaluation: PACU Anesthesia Type: General Level of consciousness: awake and alert Pain management: pain level controlled Vital Signs Assessment: post-procedure vital signs reviewed and stable Respiratory status: spontaneous breathing, nonlabored ventilation, respiratory function stable and patient connected to nasal cannula oxygen Cardiovascular status: blood pressure returned to baseline and stable Postop Assessment: no apparent nausea or vomiting Anesthetic complications: no   No notable events documented.   Last Vitals:  Vitals:   10/07/22 1730 10/07/22 1745  BP: (!) 128/54 (!) 144/67  Pulse: 87 78  Resp: 19 17  Temp:    SpO2: 99% 96%    Last Pain:  Vitals:   10/07/22 1745  TempSrc:   PainSc: 0-No pain                 Ilene Qua

## 2022-10-07 NOTE — Progress Notes (Signed)
Patient ID: [name], male   DOB: 1946-01-09, 76 y.o.   MRN: [id number]  Subjective: The patient notes continued moderate to severe pain in his left knee, especially with any attempted active or passive motion of the knee, as well as with any attempted weightbearing.  He denies any reinjury to the knee.  Objective: Vital signs in last 24 hours: Temp:  [98.2 F (36.8 C)-98.6 F (37 C)] 98.6 F (37 C) (11/27 0051) Pulse Rate:  [79-83] 79 (11/27 0743) Resp:  [16-17] 16 (11/27 0051) BP: (123-140)/(61-64) 140/64 (11/27 0743) SpO2:  [97 %-98 %] 98 % (11/27 0743)  Intake/Output from previous day: 11/26 0701 - 11/27 0700 In: 2376.7 [I.V.:2312.7; IV Piggyback:64] Out: 450 [Urine:450] Intake/Output this shift: No intake/output data recorded.  Recent Labs    10/05/22 0500 10/06/22 0528  HGB 10.8* 11.4*   Recent Labs    10/05/22 0500 10/06/22 0528  WBC 21.7* 15.1*  RBC 3.61* 3.90*  HCT 31.6* 34.2*  PLT 468* 459*   Recent Labs    10/05/22 0500 10/06/22 0528  NA 134* 140  K 4.8 4.2  CL 103 110  CO2 21* 23  BUN 76* 53*  CREATININE 1.67* 1.12  GLUCOSE 369* 274*  CALCIUM 8.5* 8.5*   No results for input(s): "LABPT", "INR" in the last 72 hours.  Physical Exam: Orthopedic examination is limited to the left knee and lower leg.  The swelling and erythema noted on the day of admission appears to be improved.  The wounds themselves appear to be healing well, but there is a small amount of purulent drainage emanating from the more lateral portal site.  There is at most a trace effusion.  He has mild-moderate tenderness to palpation over the medial lateral aspects of the knee.  He has more severe pain with any attempted active or passive motion of the knee.  He is unable to perform a straight leg raise and can tolerate knee range of motion only from approximately 20 to 40 degrees of flexion.  Grossly, he has neurovascularly intact to the left lower extremity and foot.  A fresh  dry dressing has been applied to his knee.  Assessment: Septic arthritis left knee, status post arthroscopic I&D.  Plan: The treatment options have been discussed with the patient and his partner, who is at the bedside.  Given the small amount of purulent drainage noted, emanating from the portal site as well as his continued significant discomfort with attempted active or passive motion of the knee, I feel that it would be best to proceed with a repeat arthroscopic irrigation and debridement of his left knee.    As he is n.p.o. today already for a TEE scheduled for noon, I will plan on taking him to the operating room later this afternoon for repeat arthroscopic irrigation and debridement of the knee.  This procedure has been reviewed with the patient and his partner, as have the potential risks (including bleeding, persistent or recurrent infection, nerve and/or blood vessel injury, persistent or recurrent pain, stiffness of the knee, need for further surgery, blood clots, strokes, heart attacks and/or arrhythmias, etc.) and benefits.  The patient states his understanding wishes to proceed.  A formal written consent will be obtained by the nursing staff.   Marshall Cork Verlyn Lambert 10/07/2022, 8:18 AM

## 2022-10-07 NOTE — Transfer of Care (Signed)
Immediate Anesthesia Transfer of Care Note  Patient: [name]  Procedure(s) Performed: ARTHROSCOPIC INCISION AND DRAINAGE (Left)  Patient Location: PACU  Anesthesia Type:General  Level of Consciousness: drowsy  Airway & Oxygen Therapy: Patient Spontanous Breathing and Patient connected to face mask oxygen  Post-op Assessment: Report given to RN and Post -op Vital signs reviewed and stable  Post vital signs: Reviewed and stable  Last Vitals:  Vitals Value Taken Time  BP 144/76 10/07/22 1725  Temp 36.4 C 10/07/22 1725  Pulse 76 10/07/22 1730  Resp 16 10/07/22 1730  SpO2 100 % 10/07/22 1730  Vitals shown include unvalidated device data.  Last Pain:  Vitals:   10/07/22 1725  TempSrc:   PainSc: 0-No pain      Patients Stated Pain Goal: 0 (87/68/11 5726)  Complications: No notable events documented.

## 2022-10-07 NOTE — Progress Notes (Signed)
Patient refused to take any insulin, longacting and short acting.  Will notify provider

## 2022-10-07 NOTE — Op Note (Signed)
10/07/2022  5:19 PM  Patient:   [name]  Pre-Op Diagnosis:   Septic arthritis, left knee.  Postoperative diagnosis:   Same  Procedure:   Arthroscopic irrigation and debridement, left knee.  Surgeon:   Pascal Lux, MD  Anesthesia:   General LMA  Findings:   As above.   Complications:   None  EBL:   5 cc.  Total fluids:   800 cc of crystalloid.  Tourniquet time:   None  Drains:   Hemovac drain x 1  Closure:   4-0 Prolene interrupted sutures.  Brief clinical note:   The patient is a 76 year old male who presented to the emergency room with increased pain and swelling to his left knee. Work-up confirmed the presence of a septic left knee and subsequent cultures grew out methicillin-resistant Staph aureus. The patient was taken to the operating room 4 days ago where he underwent an arthroscopic irrigation and debridement of the knee. The patient continues to describe significant pain in his knee and his white count continues to remain elevated. Therefore, he has brought back to the operating room for a repeat arthroscopic irrigation and debridement of his left knee.   Procedure:   The patient was brought into the operating room and lain in the supine position. After adequate general laryngeal mask anesthesia was obtained, the left lower extremity was prepped with ChloraPrep solution before being draped sterilely. Peri-operative antibiotics were continued. The expected portal sites were injected with 0.5% Sensorcaine with epinephrine before the previously placed sutures were removed. The camera was placed in the anterolateral portal and instrumentation performed through the anteromedial portal.   The knee was sequentially examined beginning in the suprapatellar pouch, then progressing to the patellofemoral space, the medial gutter and compartment, the notch, and finally the lateral compartment and gutter. The knee was copiously irrigated using 3 L of sterile saline  solution followed by 3 L of antibiotic irrigation followed by another 3 L of sterile saline solution. Hemostasis was achieved throughout the case using the ArthroCare wand. The instruments were removed from the joint after suctioning the excess fluid.   A single limb to a large Hemovac drain was placed through a superolateral portal site created at the end of the case. The portal sites were closed using 4-0 Prolene interrupted sutures before a sterile bulky dressing was applied to the knee. The patient was then awakened, extubated, and returned to the recovery room in satisfactory condition after tolerating the procedure well.
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
## 2022-10-07 NOTE — Progress Notes (Signed)
Progress Note  Patient Name: [name] Date of Encounter: 10/07/2022  Primary Cardiologist: Rockey Situ  Subjective   No chest pain, dyspnea, palpitations, or dizziness. He is NPO for TEE later today.   Inpatient Medications    Scheduled Meds:  aspirin EC  81 mg Oral Daily   atorvastatin  40 mg Oral Daily   carvedilol  6.25 mg Oral BID WC   cyanocobalamin  1,000 mcg Oral QODAY   docusate sodium  200 mg Oral BID   enoxaparin (LOVENOX) injection  40 mg Subcutaneous Q24H   famotidine  20 mg Oral BID   insulin aspart  0-20 Units Subcutaneous TID WC   insulin aspart  4 Units Subcutaneous TID WC   insulin glargine-yfgn  28 Units Subcutaneous Daily   multivitamin with minerals  1 tablet Oral Daily   phosphorus  250 mg Oral TID   senna  1 tablet Oral BID   Continuous Infusions:  sodium chloride 75 mL/hr at 10/07/22 0047   sodium chloride     DAPTOmycin (CUBICIN) 700 mg in sodium chloride 0.9 % IVPB Stopped (10/06/22 2137)   PRN Meds: acetaminophen **OR** acetaminophen, bisacodyl, diphenhydrAMINE, hyoscyamine, magnesium hydroxide, metoCLOPramide **OR** metoCLOPramide (REGLAN) injection, morphine injection, ondansetron **OR** ondansetron (ZOFRAN) IV, mouth rinse, oxyCODONE, phenol, sodium phosphate   Vital Signs    Vitals:   10/05/22 2356 10/06/22 0741 10/06/22 1527 10/07/22 0051  BP: (!) 125/59 (!) 147/66 123/61 129/63  Pulse: 79 84 83 79  Resp: '16 17 17 16  '$ Temp: 98.5 F (36.9 C) 98.3 F (36.8 C) 98.2 F (36.8 C) 98.6 F (37 C)  TempSrc:      SpO2: 98% 98% 98% 97%  Weight:      Height:        Intake/Output Summary (Last 24 hours) at 10/07/2022 0732 Last data filed at 10/07/2022 6378 Gross per 24 hour  Intake 2376.68 ml  Output 450 ml  Net 1926.68 ml   Filed Weights   10/02/22 2229  Weight: 88.5 kg    Telemetry    SR with rare PVCs and a short atrial run - Personally Reviewed  ECG    No new tracings - Personally Reviewed  Physical Exam    GEN: No acute distress.   Neck: No JVD. Cardiac: RRR, no murmurs, rubs, or gallops.  Respiratory: Diminished breath sounds along the bases bilaterally.  GI: Soft, nontender, non-distended.   MS: No edema; No deformity. Neuro:  Alert and oriented x 3; Nonfocal.  Psych: Normal affect.  Labs    Chemistry Recent Labs  Lab 10/03/22 0658 10/04/22 0459 10/05/22 0500 10/06/22 0528  NA 136 137 134* 140  K 4.6 5.3*  5.2* 4.8 4.2  CL 103 102 103 110  CO2 18* 19* 21* 23  GLUCOSE 390* 515* 369* 274*  BUN 59* 72* 76* 53*  CREATININE 1.38* 1.52* 1.67* 1.12  CALCIUM 9.1 8.9 8.5* 8.5*  PROT 6.6  --  5.7*  --   ALBUMIN 2.7*  --  2.2*  --   AST 19  --  14*  --   ALT 18  --  13  --   ALKPHOS 100  --  100  --   BILITOT 1.9*  --  1.1  --   GFRNONAA 53* 47* 42* >60  ANIONGAP 15 16* 10 7     Hematology Recent Labs  Lab 10/04/22 0459 10/05/22 0500 10/06/22 0528  WBC 23.9* 21.7* 15.1*  RBC 3.82* 3.61* 3.90*  HGB 11.3*  10.8* 11.4*  HCT 33.8* 31.6* 34.2*  MCV 88.5 87.5 87.7  MCH 29.6 29.9 29.2  MCHC 33.4 34.2 33.3  RDW 13.8 13.9 14.2  PLT 470* 468* 459*    Cardiac EnzymesNo results for input(s): "TROPONINI" in the last 168 hours. No results for input(s): "TROPIPOC" in the last 168 hours.   BNPNo results for input(s): "BNP", "PROBNP" in the last 168 hours.   DDimer  Recent Labs  Lab 10/02/22 1530  DDIMER 3.38*     Radiology     Cardiac Studies   2D echo 10/05/2022: 1. Left ventricular ejection fraction, by estimation, is 35 to 40%. The  left ventricle has moderately decreased function. The left ventricle  demonstrates regional wall motion abnormalities (see scoring  diagram/findings for description). The left  ventricular internal cavity size was mildly dilated. Left ventricular  diastolic parameters were normal.   2. Right ventricular systolic function is normal. The right ventricular  size is normal.   3. Left atrial size was mildly dilated.   4. The mitral  valve is normal in structure. Mild mitral valve  regurgitation.   5. The aortic valve is normal in structure. Aortic valve regurgitation is trivial.   Patient Profile     76 y.o. male with history of CAD s/p PCI/DES to the LAD in 2008, IDDM, HTN, HLD, prior tobacco use quitting in his 64's, lymphedema, and bladder cancer in 2018 who we are seeing for HFrEF and MRSA bacteremia.  Assessment & Plan    1. HFrEF: -Uncertain chronicity  -Presumed to be ICM in etiology with prior history of CAD with PCI -Will need R/LHC once improved from MRSA bacteremia  -Appears euvolemic and well compensated  -Coreg 6.25 mg bid -Start Entresto 24/26 mg bid -Escalate GDMT as tolerated -Not currently requiring a standing diuretic   2. MRSA bacteremia: -ABX per IM/ID -NPO -TEE today  3. CAD involving the native coronary arteries without angina: -No chest pain -ASA, Coreg, Lipitor  -Will need cardiac cath once he is improved from his MRSA bacteremia   4. HTN: -Blood pressure well controlled -Medical therapy as outlined above  5. HLD: -LDL 72 in 03/2022 -Lipitor   6. Nonocclusive left peroneal DVT with history of left lower extremity DVT: -Not currently on full therapeutic anticoagulation  -Management per IM  7. AKI with hyperkalemia: -Resolved -Monitor with initiation of Entresto  8. Normocytic anemia: -Stable -No obvious bleed   Shared Decision Making/Informed Consent{  The risks [esophageal damage, perforation (1:10,000 risk), bleeding, pharyngeal hematoma as well as other potential complications associated with conscious sedation including aspiration, arrhythmia, respiratory failure and death], benefits (treatment guidance and diagnostic support) and alternatives of a transesophageal echocardiogram were discussed in detail with [name] and he is willing to proceed.    For questions or updates, please contact [place] Please consult [email or web address] for contact info under  Cardiology/STEMI.    Signed, Christell Faith, [name] [place] Pager: [phone] 10/07/2022, 7:32 AM

## 2022-10-07 NOTE — Progress Notes (Addendum)
OT Cancellation Note  Patient Details Name: ARIQ KHAMIS MRN: [id number] DOB: 08/25/1946   Cancelled Treatment:    Reason Eval/Treat Not Completed: Other (comment);Patient declined, no reason specified. Pt declining to work with therapy today due to "taking a mental health day". Spoke with pt's nurse and he is planning to have a TEE and another debridement of L knee today. Will re-attempt as able.   Doneta Public 10/07/2022, 9:16 AM

## 2022-10-07 NOTE — Progress Notes (Signed)
   Date of Admission:  10/02/2022    ID: [name] is a 76 y.o. male Principal Problem:   Uncontrolled type 1 diabetes mellitus with hyperglycemia, with long-term current use of insulin ([name]) Active Problems:   HYPERTENSION, BENIGN   CAD, NATIVE VESSEL   Septic arthritis of knee, left ([name])   AKI (acute kidney injury) ([place])   DVT (deep venous thrombosis) ([name])   Bacteremia   Cardiomyopathy ([place])   Coronary artery disease involving native coronary artery of native heart   [name] is a 76 y.o. with a history of ca bladder, urethral stricture, frequent dilatation, bladder diverticulum, MRSA UTI in May 2023, CAD s/p stent, DM -insulin dependent, presents with left knee pain of more than week duration- had been to PCP and got intraarticular steroid injection  On 11/22 , prior to that he had gotten mobic for the pain. HE was also taking Aleve As no relief and was getting worse he came to the ED on 10/02/22- no fever or chills. MRSA bacteremia Left knee   Pt in OR  Medications:   apixaban  10 mg Oral BID   Followed by   Derrill Memo ON 10/14/2022] apixaban  5 mg Oral BID   aspirin EC  81 mg Oral Daily   atorvastatin  40 mg Oral Daily   carvedilol  6.25 mg Oral BID WC   cyanocobalamin  1,000 mcg Oral QODAY   docusate sodium  200 mg Oral BID   famotidine  20 mg Oral BID   insulin aspart  0-20 Units Subcutaneous TID WC   insulin aspart  4 Units Subcutaneous TID WC   insulin glargine-yfgn  28 Units Subcutaneous Daily   multivitamin with minerals  1 tablet Oral Daily   phosphorus  250 mg Oral TID   sacubitril-valsartan  1 tablet Oral BID   senna  1 tablet Oral BID    Objective: Vital signs in last 24 hours: Temp:  [98.2 F (36.8 C)-98.6 F (37 C)] 98.6 F (37 C) (11/27 0051) Pulse Rate:  [79-83] 79 (11/27 0743) Resp:  [16-17] 16 (11/27 0051) BP: (123-140)/(61-64) 140/64 (11/27 0743) SpO2:  [97 %-98 %] 98 % (11/27 0743)   Pt in OR- not seen  Lab  Results Recent Labs    10/05/22 0500 10/06/22 0528  WBC 21.7* 15.1*  HGB 10.8* 11.4*  HCT 31.6* 34.2*  NA 134* 140  K 4.8 4.2  CL 103 110  CO2 21* 23  BUN 76* 53*  CREATININE 1.67* 1.12   Liver Panel Recent Labs    10/05/22 0500  PROT 5.7*  ALBUMIN 2.2*  AST 14*  ALT 13  ALKPHOS 100  BILITOT 1.1   Se  Microbiology: 10/03/22 BC- MRSA from same site 4 bottles- vanco MIC 1 10/05/22 BC NG so far Studies/Results: No results found.   Assessment/Plan: MRSA bacteremia MRSA left knee septic arthritis Going back for another I/D today TEE pending On daptomycin  AKI- resolved  DM-   Non occlusive left peroneal vein thrombus  Ca bladder s/p excision  and chemo  Urethral stricture needing frequent dilatation Post bladder wall diverticulum H/o MRSA UTI in May 2023  H/o CAD s/p Stent

## 2022-10-07 NOTE — Progress Notes (Signed)
A surgical consent obtained over the phone by partner, Kathrene Bongo. Pt signed his surgical consent but the pt had received IV versed and IV fentanyl prior to his signature. Pt alert and Ox4.

## 2022-10-07 NOTE — Progress Notes (Signed)
PROGRESS NOTE    [name]  WCH:[id number] DOB: [birth year] DOA: 10/02/2022 PCP: [name], MD   Brief Narrative:  This 76 years old male with PMH significant for type 1 diabetes on insulin pump, history of left leg DVT, coronary artery disease s/p stent angioplasty, GERD, history of bladder cancer, history of ureteral stricture s/p dilatation, prior history of MRSA infection presented in the ED for the evaluation of left knee pain which has been there for last 6 days.  Patient reports he has developed left knee pain 6 days ago which has progressively worsened resulting in an inability to bear weight or bend his left knee.  He denies any recent trauma or falls.  He was seen by primary care physician twice and was started on Mobic without any improvement and then he received lidocaine/Kenalog injection 1 day prior to his admission which also shows no improvement in his symptoms.  Patient had an MRI of left knee which shows moderate knee joint effusion with synovial thickening suggesting synovitis. He is also found to have complex tear of posterior horn of medial meniscus. Orthopedics was consulted and patient was taken to the OR for arthroscopy and washout of left knee.  Tolerated well.  Assessment & Plan:   Principal Problem:   Uncontrolled type 1 diabetes mellitus with hyperglycemia, with long-term current use of insulin ([name]) Active Problems:   Septic arthritis of knee, left ([name])   AKI (acute kidney injury) ([place])   DVT (deep venous thrombosis) ([name])   HYPERTENSION, BENIGN   CAD, NATIVE VESSEL   Bacteremia   Cardiomyopathy ([place])   Coronary artery disease involving native coronary artery of native heart  Septic arthritis of left knee: MRSA Bacteremia: Patient presented for the evaluation of worsening left knee pain and swelling . He was unable to bear weight on left leg. He was noted to have marked leukocytosis which could be related to systemic steroid administration.    MRI concerning for septic arthritis and meniscal tear. Orthopedics was consulted. He underwent extensive arthroscopic debridement with partial meniscectomy Empirically started on vancomycin and Rocephin. Begin physical therapy for ambulation and gentle range of motion. Infectious diseases consulted, antibiotics changed to daptomycin given worsening renal functions. 2D echocardiogram shows LVEF 35 to 40%.  No vegetations. Cardiology consulted for TEE and possible cardiology evaluation for reduced LVEF. Patient is scheduled to have TEE today followed by repeat arthroscopic irrigation and debridement.  Chronic systolic heart failure: It could be acute or acute on chronic CHF likely ischemic cardiomyopathy. TTE shows LVEF 35 to 40% with reduced left ventricular function. He appears euvolemic on exam.  Cardiology consulted, started on Entresto.   Continue Coreg and escalate GDMT as tolerated.  Uncontrolled type 1 diabetes mellitus with hyperglycemia: Most likely related to systemic steroid injection the patient has received for left knee pain. Hold insulin pump for now. Continue gentle fluid resuscitation. Continue Semglee 28 units daily with sliding scale coverage. Carb modified diet. Appreciate Diabetic coordinator consult.   Acute kidney injury : > Resolved. Baseline serum creatinine 1.0, presented in the ED with creatinine of 1.38 Unclear etiology of worsening renal functions but could be related to recent NSAID use. Renal functions improved with IV hydration.  Avoid nephrotoxic medications.  Left peroneal vein DVT: Patient noted to have nonocclusive thrombus seen within the left calf peroneal vein. Continue Eliquis.   Coronary artery disease, native vessel. Stable. Continue Coreg, Lipitor, and aspirin.   Essential hypertension Continue Coreg.   Hypophosphatemia: Replaced.  Continue  to monitor.  DVT prophylaxis: Eliquis Code Status: Full code Family Communication: Partner  at bedside Disposition Plan:   Status is: Inpatient Remains inpatient appropriate because: Patient admitted for left knee swelling,  found to have septic arthritis requiring IV antibiotics.   Orthopedics is consulted,  Patient underwent repair of the meniscus tear.  Infectious disease recommended TEE to rule out infective endocarditis.  Patient is going to have TEE today followed by repeat arthroscopic debridement  Plan anticipated discharge to SNF.   Consultants:  Orthopedics  Procedures: Arthroscopy with debridement with partial meniscectomy Antimicrobials: Anti-infectives (From admission, onward)    Start     Dose/Rate Route Frequency Ordered Stop   10/05/22 1200  DAPTOmycin (CUBICIN) 700 mg in sodium chloride 0.9 % IVPB        8 mg/kg  88.5 kg 128 mL/hr over 30 Minutes Intravenous Daily 10/04/22 1550     10/05/22 0800  vancomycin (VANCOREADY) IVPB 1250 mg/250 mL  Status:  Discontinued        1,250 mg 166.7 mL/hr over 90 Minutes Intravenous Every 24 hours 10/04/22 0840 10/04/22 1550   10/04/22 1600  vancomycin (VANCOREADY) IVPB 1250 mg/250 mL  Status:  Discontinued        1,250 mg 166.7 mL/hr over 90 Minutes Intravenous Every 24 hours 10/03/22 1359 10/04/22 0746   10/04/22 0800  vancomycin (VANCOREADY) IVPB 2000 mg/400 mL        2,000 mg 200 mL/hr over 120 Minutes Intravenous  Once 10/04/22 0747 10/04/22 1026   10/03/22 0815  vancomycin (VANCOREADY) IVPB 2000 mg/400 mL  Status:  Discontinued        2,000 mg 200 mL/hr over 120 Minutes Intravenous  Once 10/03/22 0811 10/04/22 0746   10/03/22 0800  cefTRIAXone (ROCEPHIN) 2 g in sodium chloride 0.9 % 100 mL IVPB  Status:  Discontinued        2 g 200 mL/hr over 30 Minutes Intravenous Every 24 hours 10/03/22 0754 10/04/22 0159       Subjective: Patient was seen and examined at bedside.Overnight events noted. Patient is still reports having pain in the left knee but has much improved. Patient s/p arthroscopic debridement with  partial meniscectomy. POD #4.  Objective: Vitals:   10/06/22 0741 10/06/22 1527 10/07/22 0051 10/07/22 0743  BP: (!) 147/66 123/61 129/63 (!) 140/64  Pulse: 84 83 79 79  Resp: '17 17 16   '$ Temp: 98.3 F (36.8 C) 98.2 F (36.8 C) 98.6 F (37 C)   TempSrc:      SpO2: 98% 98% 97% 98%  Weight:      Height:        Intake/Output Summary (Last 24 hours) at 10/07/2022 1153 Last data filed at 10/07/2022 7169 Gross per 24 hour  Intake 2376.68 ml  Output 450 ml  Net 1926.68 ml   Filed Weights   10/02/22 2229  Weight: 88.5 kg    Examination:  General exam: Appears comfortable, not in any acute distress, deconditioned. Respiratory system: CTA bilaterally, respiratory effort normal, RR 13. Cardiovascular system: S1-S2 heard, regular rate and rhythm, no murmur. Gastrointestinal system: Abdomen is soft, non tender, non distended, BS+ Central nervous system: Alert and oriented x3, no focal neurological deficits. Extremities: Left knee tenderness noted, left knee covered in dressing. Skin: No rashes, lesions or ulcers Psychiatry: Judgement and insight appear normal. Mood & affect appropriate.     Data Reviewed: I have personally reviewed following labs and imaging studies  CBC: Recent Labs  Lab 10/03/22 0658 10/04/22  0459 10/05/22 0500 10/06/22 0528  WBC 20.5* 23.9* 21.7* 15.1*  NEUTROABS 17.1*  --   --   --   HGB 10.5* 11.3* 10.8* 11.4*  HCT 32.0* 33.8* 31.6* 34.2*  MCV 88.6 88.5 87.5 87.7  PLT 398 470* 468* 099*   Basic Metabolic Panel: Recent Labs  Lab 10/03/22 0658 10/04/22 0459 10/05/22 0500 10/06/22 0528  NA 136 137 134* 140  K 4.6 5.3*  5.2* 4.8 4.2  CL 103 102 103 110  CO2 18* 19* 21* 23  GLUCOSE 390* 515* 369* 274*  BUN 59* 72* 76* 53*  CREATININE 1.38* 1.52* 1.67* 1.12  CALCIUM 9.1 8.9 8.5* 8.5*  MG  --   --  2.2 2.3  PHOS  --   --  3.0 1.8*   GFR: Estimated Creatinine Clearance: 62.7 mL/min (by C-G formula based on SCr of 1.12 mg/dL). Liver  Function Tests: Recent Labs  Lab 10/03/22 0658 10/05/22 0500  AST 19 14*  ALT 18 13  ALKPHOS 100 100  BILITOT 1.9* 1.1  PROT 6.6 5.7*  ALBUMIN 2.7* 2.2*   No results for input(s): "LIPASE", "AMYLASE" in the last 168 hours. No results for input(s): "AMMONIA" in the last 168 hours. Coagulation Profile: No results for input(s): "INR", "PROTIME" in the last 168 hours. Cardiac Enzymes: Recent Labs  Lab 10/04/22 0900 10/05/22 0500  CKTOTAL 82 49   BNP (last 3 results) No results for input(s): "PROBNP" in the last 8760 hours. HbA1C: No results for input(s): "HGBA1C" in the last 72 hours.  CBG: Recent Labs  Lab 10/06/22 0757 10/06/22 1224 10/06/22 1641 10/06/22 2119 10/07/22 0746  GLUCAP 274* 235* 134* 213* 202*   Lipid Profile: No results for input(s): "CHOL", "HDL", "LDLCALC", "TRIG", "CHOLHDL", "LDLDIRECT" in the last 72 hours. Thyroid Function Tests: No results for input(s): "TSH", "T4TOTAL", "FREET4", "T3FREE", "THYROIDAB" in the last 72 hours. Anemia Panel: No results for input(s): "VITAMINB12", "FOLATE", "FERRITIN", "TIBC", "IRON", "RETICCTPCT" in the last 72 hours. Sepsis Labs: Recent Labs  Lab 10/03/22 0906 10/03/22 1500  LATICACIDVEN 1.9 1.6    Recent Results (from the past 240 hour(s))  Blood Culture (routine x 2)     Status: Abnormal   Collection Time: 10/03/22  9:06 AM   Specimen: BLOOD  Result Value Ref Range Status   Specimen Description   Final    BLOOD RIGHT AC Performed at [facility], [address], [place], [place] [address]    Special Requests   Final    BOTTLES DRAWN AEROBIC AND ANAEROBIC Blood Culture adequate volume Performed at [facility], [address] [place], [place] [address]    Culture  Setup Time   Final    GRAM POSITIVE COCCI IN BOTH AEROBIC AND ANAEROBIC BOTTLES CRITICAL RESULT CALLED TO, READ BACK BY AND VERIFIED WITH: Violeta Gelinas PHARMD AT 7673 10/04/2022 GAA    Culture METHICILLIN RESISTANT  STAPHYLOCOCCUS AUREUS (A)  Final   Report Status 10/06/2022 FINAL  Final   Organism ID, Bacteria METHICILLIN RESISTANT STAPHYLOCOCCUS AUREUS  Final      Susceptibility   Methicillin resistant staphylococcus aureus - MIC*    CIPROFLOXACIN >=8 RESISTANT Resistant     ERYTHROMYCIN >=8 RESISTANT Resistant     GENTAMICIN <=0.5 SENSITIVE Sensitive     OXACILLIN >=4 RESISTANT Resistant     TETRACYCLINE <=1 SENSITIVE Sensitive     VANCOMYCIN 1 SENSITIVE Sensitive     TRIMETH/SULFA <=10 SENSITIVE Sensitive     CLINDAMYCIN <=0.25 SENSITIVE Sensitive  RIFAMPIN <=0.5 SENSITIVE Sensitive     Inducible Clindamycin NEGATIVE Sensitive     * METHICILLIN RESISTANT STAPHYLOCOCCUS AUREUS  Blood Culture (routine x 2)     Status: Abnormal   Collection Time: 10/03/22  9:06 AM   Specimen: BLOOD  Result Value Ref Range Status   Specimen Description   Final    BLOOD RIGTH [facility] Performed at [facility], [address] [place], [place] [address]    Special Requests   Final    BOTTLES DRAWN AEROBIC AND ANAEROBIC Blood Culture adequate volume Performed at [facility], [address], [place], [place] [address]    Culture  Setup Time   Final    GRAM POSITIVE COCCI IN BOTH AEROBIC AND ANAEROBIC BOTTLES GRAM STAIN REVIEWED-AGREE WITH RESULT CRITICAL VALUE NOTED.  VALUE IS CONSISTENT WITH PREVIOUSLY REPORTED AND CALLED VALUE. Performed at [facility], [address] [place], [place] [address]    Culture (A)  Final    STAPHYLOCOCCUS AUREUS SUSCEPTIBILITIES PERFORMED ON PREVIOUS CULTURE WITHIN THE LAST 5 DAYS. Performed at [facility], [place] [address], [place], [place] [address]    Report Status 10/06/2022 FINAL  Final  Blood Culture ID Panel (Reflexed)     Status: Abnormal   Collection Time: 10/03/22  9:06 AM  Result Value Ref Range Status   Enterococcus faecalis NOT DETECTED NOT DETECTED Final   Enterococcus Faecium NOT DETECTED NOT DETECTED Final   Listeria  monocytogenes NOT DETECTED NOT DETECTED Final   Staphylococcus species DETECTED (A) NOT DETECTED Final    Comment: CRITICAL RESULT CALLED TO, READ BACK BY AND VERIFIED WITH: Violeta Gelinas PHARMD AT 0150 10/04/2022 GAA    Staphylococcus aureus (BCID) DETECTED (A) NOT DETECTED Final    Comment: Methicillin (oxacillin)-resistant Staphylococcus aureus (MRSA). MRSA is predictably resistant to beta-lactam antibiotics (except ceftaroline). Preferred therapy is vancomycin unless clinically contraindicated. Patient requires contact precautions if  hospitalized. CRITICAL RESULT CALLED TO, READ BACK BY AND VERIFIED WITH: Violeta Gelinas PHARMD AT 2956 10/04/2022 GAA    Staphylococcus epidermidis NOT DETECTED NOT DETECTED Final   Staphylococcus lugdunensis NOT DETECTED NOT DETECTED Final   Streptococcus species NOT DETECTED NOT DETECTED Final   Streptococcus agalactiae NOT DETECTED NOT DETECTED Final   Streptococcus pneumoniae NOT DETECTED NOT DETECTED Final   Streptococcus pyogenes NOT DETECTED NOT DETECTED Final   A.calcoaceticus-baumannii NOT DETECTED NOT DETECTED Final   Bacteroides fragilis NOT DETECTED NOT DETECTED Final   Enterobacterales NOT DETECTED NOT DETECTED Final   Enterobacter cloacae complex NOT DETECTED NOT DETECTED Final   Escherichia coli NOT DETECTED NOT DETECTED Final   Klebsiella aerogenes NOT DETECTED NOT DETECTED Final   Klebsiella oxytoca NOT DETECTED NOT DETECTED Final   Klebsiella pneumoniae NOT DETECTED NOT DETECTED Final   Proteus species NOT DETECTED NOT DETECTED Final   Salmonella species NOT DETECTED NOT DETECTED Final   Serratia marcescens NOT DETECTED NOT DETECTED Final   Haemophilus influenzae NOT DETECTED NOT DETECTED Final   Neisseria meningitidis NOT DETECTED NOT DETECTED Final   Pseudomonas aeruginosa NOT DETECTED NOT DETECTED Final   Stenotrophomonas maltophilia NOT DETECTED NOT DETECTED Final   Candida albicans NOT DETECTED NOT DETECTED Final   Candida  auris NOT DETECTED NOT DETECTED Final   Candida glabrata NOT DETECTED NOT DETECTED Final   Candida krusei NOT DETECTED NOT DETECTED Final   Candida parapsilosis NOT DETECTED NOT DETECTED Final   Candida tropicalis NOT DETECTED NOT DETECTED Final   Cryptococcus neoformans/gattii NOT DETECTED NOT DETECTED  Final   Meth resistant mecA/C and MREJ DETECTED (A) NOT DETECTED Final    Comment: CRITICAL RESULT CALLED TO, READ BACK BY AND VERIFIED WITHVioleta Gelinas PHARMD AT 1696 10/04/2022 GAA Performed at [facility], [address] [place], [place] [address]   Aerobic/Anaerobic Culture w Gram Stain (surgical/deep wound)     Status: None (Preliminary result)   Collection Time: 10/03/22 11:45 AM   Specimen: PATH Cytology Misc. fluid; Body Fluid  Result Value Ref Range Status   Specimen Description   Final    KNEE LEFT Performed at [facility], [address] [place], [place] [address]    Special Requests   Final    NONE Performed at [facility], [address] [place], [place] [address]    Gram Stain   Final    ABUNDANT WBC PRESENT, PREDOMINANTLY PMN MODERATE GRAM POSITIVE COCCI IN PAIRS IN CLUSTERS Gram Stain Report Called to,Read Back By and Verified With:  Doretha Sou MCWHITE, RN 10/04/22 0100 A. LAFRANCE Performed at [facility], [place] [address], [place], [place] [address]    Culture   Final    ABUNDANT METHICILLIN RESISTANT STAPHYLOCOCCUS AUREUS NO ANAEROBES ISOLATED; CULTURE IN PROGRESS FOR 5 DAYS    Report Status PENDING  Incomplete   Organism ID, Bacteria METHICILLIN RESISTANT STAPHYLOCOCCUS AUREUS  Final      Susceptibility   Methicillin resistant staphylococcus aureus - MIC*    CIPROFLOXACIN >=8 RESISTANT Resistant     ERYTHROMYCIN >=8 RESISTANT Resistant     GENTAMICIN <=0.5 SENSITIVE Sensitive     OXACILLIN >=4 RESISTANT Resistant     TETRACYCLINE <=1 SENSITIVE Sensitive     VANCOMYCIN 1 SENSITIVE Sensitive     TRIMETH/SULFA <=10  SENSITIVE Sensitive     CLINDAMYCIN <=0.25 SENSITIVE Sensitive     RIFAMPIN <=0.5 SENSITIVE Sensitive     Inducible Clindamycin NEGATIVE Sensitive     * ABUNDANT METHICILLIN RESISTANT STAPHYLOCOCCUS AUREUS  Culture, blood (Routine X 2) w Reflex to ID Panel     Status: None (Preliminary result)   Collection Time: 10/05/22  4:59 AM   Specimen: BLOOD RIGHT WRIST  Result Value Ref Range Status   Specimen Description BLOOD RIGHT WRIST  Final   Special Requests Blood Culture adequate volume  Final   Culture   Final    NO GROWTH 2 DAYS Performed at [facility], [address], [place], [place] [address]    Report Status PENDING  Incomplete  Culture, blood (Routine X 2) w Reflex to ID Panel     Status: None (Preliminary result)   Collection Time: 10/05/22  7:04 AM   Specimen: BLOOD LEFT HAND  Result Value Ref Range Status   Specimen Description BLOOD LEFT HAND  Final   Special Requests Blood Culture adequate volume  Final   Culture   Final    NO GROWTH 2 DAYS Performed at [facility], [address], [place], [place] [address]    Report Status PENDING  Incomplete    Radiology Studies: No results found.  Scheduled Meds:  apixaban  10 mg Oral BID   Followed by   Derrill Memo ON 10/14/2022] apixaban  5 mg Oral BID   aspirin EC  81 mg Oral Daily   atorvastatin  40 mg Oral Daily   carvedilol  6.25 mg Oral BID WC   cyanocobalamin  1,000 mcg Oral QODAY   docusate sodium  200 mg Oral BID   famotidine  20 mg  Oral BID   insulin aspart  0-20 Units Subcutaneous TID WC   insulin aspart  4 Units Subcutaneous TID WC   insulin glargine-yfgn  28 Units Subcutaneous Daily   multivitamin with minerals  1 tablet Oral Daily   phosphorus  250 mg Oral TID   sacubitril-valsartan  1 tablet Oral BID   senna  1 tablet Oral BID   Continuous Infusions:  sodium chloride 75 mL/hr at 10/07/22 0047   sodium chloride 20 mL/hr at 10/07/22 0856   DAPTOmycin (CUBICIN) 700 mg in sodium chloride  0.9 % IVPB Stopped (10/06/22 2137)     LOS: 4 days     Time spent: 35 mins    Milca Sytsma, MD [facility]   If 7PM-7AM, please contact night-coverage

## 2022-10-07 NOTE — Progress Notes (Signed)
*  PRELIMINARY RESULTS* Echocardiogram Echocardiogram Transesophageal has been performed.  Sherrie Sport 10/07/2022, 1:16 PM

## 2022-10-07 NOTE — Progress Notes (Signed)
PT Cancellation Note  Patient Details Name: MANUAL NAVARRA MRN: [id number] DOB: 1946-08-23   Cancelled Treatment:    Reason Eval/Treat Not Completed: Patient at procedure or test/unavailable  Offered session but he declined.  Stated is is awaiting TEE and then will have another knee intervention after.  Would like to rest this am.  Will continue tomorrow as appropriate.   Chesley Noon 10/07/2022, 11:23 AM

## 2022-10-07 NOTE — Procedures (Signed)
Transesophageal Echocardiogram :  Indication: bacteremia Requesting/ordering  physician:   Procedure: 10 m of viscous lidocaine were given orally to provide local anesthesia to the oropharynx. The patient was positioned supine on the left side, bite block provided. The patient was moderately sedated with the doses of versed and fentanyl as detailed below.  Using digital technique an omniplane probe was advanced into the esophagus without incident.   Moderate sedation: 1. Sedation used:  Versed: '2mg'$ , Fentanyl: 77mg 2. Time administered: 1245  Time when patient started recovery: 105pm 3. I was face to face during this time 250ms  See report in EPIC  for complete details: In brief, imaging revealed moderately reduced LV function with no RWMAs and no mural apical thrombus.  .  Estimated ejection fraction was 35-40%.  Right sided cardiac chambers were normal with no evidence of pulmonary hypertension.  Imaging of the septum showed no ASD or VSD Bubble study was negative for shunt  No evidence for endocarditis  The LA was well visualized in orthogonal views.  There was no spontaneous contrast and no thrombus in the LA and LA appendage   The descending thoracic aorta had no  mural aortic debris with no evidence of aneurysmal dilation or disection  Conclusion -no evidence for endocarditis -moderately reduced EF   BrAaron Edelmangbor-Etang 10/07/2022 1:13 PM

## 2022-10-08 ENCOUNTER — Encounter: Payer: Self-pay | Admitting: Surgery

## 2022-10-08 DIAGNOSIS — M00062 Staphylococcal arthritis, left knee: Secondary | ICD-10-CM | POA: Diagnosis not present

## 2022-10-08 DIAGNOSIS — B9562 Methicillin resistant Staphylococcus aureus infection as the cause of diseases classified elsewhere: Secondary | ICD-10-CM | POA: Diagnosis not present

## 2022-10-08 DIAGNOSIS — R7881 Bacteremia: Secondary | ICD-10-CM | POA: Diagnosis not present

## 2022-10-08 DIAGNOSIS — E1065 Type 1 diabetes mellitus with hyperglycemia: Secondary | ICD-10-CM | POA: Diagnosis not present

## 2022-10-08 LAB — C-REACTIVE PROTEIN: CRP: 22.6 mg/dL — ABNORMAL HIGH (ref <1.0)

## 2022-10-08 LAB — BASIC METABOLIC PANEL
Anion gap: 11 (ref 5–15)
BUN: 34 mg/dL — ABNORMAL HIGH (ref 8–23)
CO2: 20 mmol/L — ABNORMAL LOW (ref 22–32)
Calcium: 8.1 mg/dL — ABNORMAL LOW (ref 8.9–10.3)
Chloride: 110 mmol/L (ref 98–111)
Creatinine, Ser: 1.06 mg/dL (ref 0.61–1.24)
GFR, Estimated: >60 mL/min (ref >60)
Glucose, Bld: 367 mg/dL — ABNORMAL HIGH (ref 70–99)
Potassium: 4.6 mmol/L (ref 3.5–5.1)
Sodium: 141 mmol/L (ref 135–145)

## 2022-10-08 LAB — CBC
HCT: 35.7 % — ABNORMAL LOW (ref 39.0–52.0)
Hemoglobin: 11.6 g/dL — ABNORMAL LOW (ref 13.0–17.0)
MCH: 29.1 pg (ref 26.0–34.0)
MCHC: 32.5 g/dL (ref 30.0–36.0)
MCV: 89.7 fL (ref 80.0–100.0)
Platelets: 462 10*3/uL — ABNORMAL HIGH (ref 150–400)
RBC: 3.98 MIL/uL — ABNORMAL LOW (ref 4.22–5.81)
RDW: 14.7 % (ref 11.5–15.5)
WBC: 16.1 10*3/uL — ABNORMAL HIGH (ref 4.0–10.5)
nRBC: 0 % (ref 0.0–0.2)

## 2022-10-08 LAB — GLUCOSE, CAPILLARY
Glucose-Capillary: 342 mg/dL — ABNORMAL HIGH (ref 70–99)
Glucose-Capillary: 341 mg/dL — ABNORMAL HIGH (ref 70–99)
Glucose-Capillary: 245 mg/dL — ABNORMAL HIGH (ref 70–99)
Glucose-Capillary: 248 mg/dL — ABNORMAL HIGH (ref 70–99)

## 2022-10-08 LAB — AEROBIC/ANAEROBIC CULTURE W GRAM STAIN (SURGICAL/DEEP WOUND)

## 2022-10-08 LAB — SEDIMENTATION RATE: Sed Rate: 85 mm/hr — ABNORMAL HIGH (ref 0–20)

## 2022-10-08 LAB — PHOSPHORUS: Phosphorus: 3.4 mg/dL (ref 2.5–4.6)

## 2022-10-08 LAB — MAGNESIUM: Magnesium: 2.3 mg/dL (ref 1.7–2.4)

## 2022-10-08 MED ORDER — DAPAGLIFLOZIN PROPANEDIOL 10 MG PO TABS
10.0000 mg | ORAL_TABLET | Freq: Every day | ORAL | Status: DC
  Administered 2022-10-08: 10 mg via ORAL
  Filled 2022-10-08: qty 1

## 2022-10-08 MED ORDER — INSULIN ASPART 100 UNIT/ML IJ SOLN
6.0000 [IU] | Freq: Three times a day (TID) | INTRAMUSCULAR | Status: DC
  Administered 2022-10-08 – 2022-10-14 (×9): 6 [IU] via SUBCUTANEOUS
  Filled 2022-10-08 (×10): qty 1

## 2022-10-08 MED ORDER — BISACODYL 5 MG PO TBEC
5.0000 mg | DELAYED_RELEASE_TABLET | Freq: Two times a day (BID) | ORAL | Status: DC
  Administered 2022-10-08 – 2022-10-13 (×12): 5 mg via ORAL
  Filled 2022-10-08 (×12): qty 1

## 2022-10-08 NOTE — Inpatient Diabetes Management (Signed)
Inpatient Diabetes Program Recommendations  AACE/ADA: New Consensus Statement on Inpatient Glycemic Control (2015)  Target Ranges:  Prepandial:   less than 140 mg/dL      Peak postprandial:   less than 180 mg/dL (1-2 hours)      Critically ill patients:  140 - 180 mg/dL   Lab Results  Component Value Date   GLUCAP 342 (H) 10/08/2022   HGBA1C 7.3 (H) 10/03/2022    Review of Glycemic Control  Diabetes history: DM1   Outpatient Diabetes medications:  Basal Correction Carb ratio Target BG 12 AM 0.85 unit/hr 40 1:20 120 4 AM 0.75 unit/hr 40 1:13 120 7:30 AM 1.1 unit/hr 40 1:13 120 4 PM 1.2 unit/hr 40 1:18 120 24-hr basal = 25 units Duration of insulin = 5 hrs    Current orders for Inpatient glycemic control:  Semglee 28 units QD (increased today from 24 units) Novolog 4 units TID Novolog 0-20 units TID and HS Farxiga 10 mg qd    Inpatient Diabetes Program Recommendations:     Per pump settings, he is getting between 13-20 units of meal coverage TID.   Please consider: Novolog 6-8 units TID with meals if he consumes at least 50%.  Noted patient started on Farxiga. Patient will need extra caution if develops nausea and vomiting or is dehydrated, has increased risk of DKA.  Thank you, Nani Gasser. Aslan Montagna, RN, MSN, CDE  Diabetes Coordinator Inpatient Glycemic Control Team Team Pager [phone] (8am-5pm) 10/08/2022 9:38 AM

## 2022-10-08 NOTE — TOC Progression Note (Signed)
Transition of Care [facility]) - Progression Note    Patient Details  Name: [name] MRN: [id number] Date of Birth: February 08, 1946  Transition of Care [facility]) CM/SW [place], RN Phone Number: 10/08/2022, 3:47 PM  Clinical Narrative:     Met with the patient in the room, he is agreeable to go to [facility]  I explained the process, will review the bed offers once obtained Bedsearch sent  Expected Discharge Plan: [place] Barriers to Discharge: Continued Medical Work up  Expected Discharge Plan and Services Expected Discharge Plan: [place] arrangements for the past 2 months: Single Family Home                                       Social Determinants of Health (SDOH) Interventions    Readmission Risk Interventions     No data to display

## 2022-10-08 NOTE — Plan of Care (Signed)
  Problem: Clinical Measurements: Goal: Ability to maintain clinical measurements within normal limits will improve Outcome: Progressing   Problem: Clinical Measurements: Goal: Respiratory complications will improve Outcome: Progressing   Problem: Coping: Goal: Level of anxiety will decrease Outcome: Progressing   Problem: Skin Integrity: Goal: Risk for impaired skin integrity will decrease Outcome: Progressing   Problem: Health Behavior/Discharge Planning: Goal: Ability to identify and utilize available resources and services will improve Outcome: Progressing   Problem: Health Behavior/Discharge Planning: Goal: Ability to manage health-related needs will improve Outcome: Progressing

## 2022-10-08 NOTE — Progress Notes (Signed)
Progress Note  Patient Name: [name] Date of Encounter: 10/08/2022  Primary Cardiologist: Rockey Situ  Subjective   No chest pain, dyspnea, palpitations, or dizziness.  TEE without evidence of endocarditis.  Has noted some epistaxis.  Inpatient Medications    Scheduled Meds:  acetaminophen  1,000 mg Oral Q6H   apixaban  10 mg Oral BID   Followed by   Derrill Memo ON 10/14/2022] apixaban  5 mg Oral BID   atorvastatin  40 mg Oral Daily   carvedilol  6.25 mg Oral BID WC   cyanocobalamin  1,000 mcg Oral QODAY   dapagliflozin propanediol  10 mg Oral Daily   docusate sodium  200 mg Oral BID   famotidine  20 mg Oral BID   insulin aspart  0-20 Units Subcutaneous TID WC   insulin aspart  6 Units Subcutaneous TID WC   insulin glargine-yfgn  28 Units Subcutaneous Daily   ketorolac  7.5 mg Intravenous Q6H   multivitamin with minerals  1 tablet Oral Daily   sacubitril-valsartan  1 tablet Oral BID   senna  1 tablet Oral BID   Continuous Infusions:  sodium chloride 75 mL/hr at 10/08/22 9024   DAPTOmycin (CUBICIN) 700 mg in sodium chloride 0.9 % IVPB Stopped (10/07/22 2333)   PRN Meds: acetaminophen, bisacodyl, diphenhydrAMINE, hyoscyamine, magnesium hydroxide, metoCLOPramide **OR** metoCLOPramide (REGLAN) injection, morphine injection, ondansetron **OR** ondansetron (ZOFRAN) IV, mouth rinse, oxyCODONE, phenol, sodium phosphate   Vital Signs    Vitals:   10/07/22 1745 10/07/22 1800 10/08/22 0050 10/08/22 0755  BP: (!) 144/67 (!) 142/68 (!) 111/91 (!) 124/58  Pulse: 78 79 81 74  Resp: '17 19 20 17  '$ Temp:  98 F (36.7 C) 98.2 F (36.8 C) 97.6 F (36.4 C)  TempSrc:      SpO2: 96% 96% 97% 99%  Weight:      Height:        Intake/Output Summary (Last 24 hours) at 10/08/2022 1028 Last data filed at 10/08/2022 0802 Gross per 24 hour  Intake 1975.42 ml  Output 1115 ml  Net 860.42 ml    Filed Weights   10/02/22 2229 10/07/22 1448  Weight: 88.5 kg 88.5 kg    Telemetry     SR with rare PVCs - Personally Reviewed  ECG    No new tracings - Personally Reviewed  Physical Exam   GEN: No acute distress.   Neck: No JVD. Cardiac: RRR, no murmurs, rubs, or gallops.  Respiratory: Diminished breath sounds along the bases bilaterally.  GI: Soft, nontender, non-distended.   MS: No edema; No deformity. Neuro:  Alert and oriented x 3; Nonfocal.  Psych: Normal affect.  Labs    Chemistry Recent Labs  Lab 10/03/22 [phone] 10/04/22 0459 10/05/22 0500 10/06/22 0528 10/08/22 0632  NA 136   < > 134* 140 141  K 4.6   < > 4.8 4.2 4.6  CL 103   < > 103 110 110  CO2 18*   < > 21* 23 20*  GLUCOSE 390*   < > 369* 274* 367*  BUN 59*   < > 76* 53* 34*  CREATININE 1.38*   < > 1.67* 1.12 1.06  CALCIUM 9.1   < > 8.5* 8.5* 8.1*  PROT 6.6  --  5.7*  --   --   ALBUMIN 2.7*  --  2.2*  --   --   AST 19  --  14*  --   --   ALT 18  --  13  --   --   ALKPHOS 100  --  100  --   --   BILITOT 1.9*  --  1.1  --   --   GFRNONAA 53*   < > 42* >60 >60  ANIONGAP 15   < > '10 7 11   '$ < > = values in this interval not displayed.      Hematology Recent Labs  Lab 10/05/22 0500 10/06/22 0528 10/08/22 0632  WBC 21.7* 15.1* 16.1*  RBC 3.61* 3.90* 3.98*  HGB 10.8* 11.4* 11.6*  HCT 31.6* 34.2* 35.7*  MCV 87.5 87.7 89.7  MCH 29.9 29.2 29.1  MCHC 34.2 33.3 32.5  RDW 13.9 14.2 14.7  PLT 468* 459* 462*     Cardiac EnzymesNo results for input(s): "TROPONINI" in the last 168 hours. No results for input(s): "TROPIPOC" in the last 168 hours.   BNPNo results for input(s): "BNP", "PROBNP" in the last 168 hours.   DDimer  Recent Labs  Lab 10/02/22 1530  DDIMER 3.38*      Radiology     Cardiac Studies   2D echo 10/05/2022: 1. Left ventricular ejection fraction, by estimation, is 35 to 40%. The  left ventricle has moderately decreased function. The left ventricle  demonstrates regional wall motion abnormalities (see scoring  diagram/findings for description). The left   ventricular internal cavity size was mildly dilated. Left ventricular  diastolic parameters were normal.   2. Right ventricular systolic function is normal. The right ventricular  size is normal.   3. Left atrial size was mildly dilated.   4. The mitral valve is normal in structure. Mild mitral valve  regurgitation.   5. The aortic valve is normal in structure. Aortic valve regurgitation is trivial.   Patient Profile     76 y.o. male with history of CAD s/p PCI/DES to the LAD in 2008, IDDM, HTN, HLD, prior tobacco use quitting in his 67's, lymphedema, and bladder cancer in 2018 who we are seeing for HFrEF and MRSA bacteremia.  Assessment & Plan    1. HFrEF: -Uncertain chronicity  -Presumed to be ICM in etiology with prior history of CAD with PCI -Will need R/LHC once improved from MRSA bacteremia, and pending treatment for nonocclusive DVT -Appears euvolemic and well compensated  -Coreg 6.25 mg bid, and Entresto 24/26 mg twice daily -Add Farxiga 10 mg daily -Escalate GDMT as tolerated -Not currently requiring a standing diuretic   2. MRSA bacteremia: -ABX per IM/ID -TEE without evidence of endocarditis  3. CAD involving the native coronary arteries without angina: -No chest pain -ASA, Coreg, Lipitor  -Will need cardiac cath once he is improved from his MRSA bacteremia/treatment for DVT  4. HTN: -Blood pressure well controlled -Medical therapy as outlined above  5. HLD: -LDL 72 in 03/2022 -Lipitor   6. Nonocclusive left peroneal DVT with history of left lower extremity DVT: -Apixaban per IM  7. AKI with hyperkalemia: -Resolved -Monitor with initiation of Entresto  8. Normocytic anemia/epistaxis: -Stable -Per IM    For questions or updates, please contact [facility] Please consult [email or web address] for contact info under Cardiology/STEMI.    Signed, Christell Faith, [name] [place] Pager: [phone] 10/08/2022, 10:28 AM

## 2022-10-08 NOTE — Evaluation (Signed)
Physical Therapy Evaluation Patient Details Name: [name] MRN: [id number] DOB: August 30, 1946 Today's Date: 10/08/2022  History of Present Illness  Patient is a 76 year old male presented to ER and admitted secondary to progressive L knee pain, s/p arthroscopic debridement, partial menisectomy (10/03/22); hospital stay complicated by uncontrolled DM. Nonocclusive left peroneal DVT with history of left lower extremity DVT. Patient underwent Arthroscopic irrigation and debridement, left knee for septic arthritis on 10/07/22  Clinical Impression  PT re-evaluation performed after left knee I&D. The patient is motivated to participate and able to follow single step commands consistently. He reports knee pain has improved overall during mobility efforts today compared to previous sessions. He continues to require physical assistance with bed mobility, transfers, and short distance ambulation. Activity tolerance limited by fatigue. Cues are also required for safety and to increase independence during functional mobility tasks. SNF remains appropriate discharge recommendation at this time. PT will continue to follow to maximize independence and decrease caregiver burden.       Recommendations for follow up therapy are one component of a multi-disciplinary discharge planning process, led by the attending physician.  Recommendations may be updated based on patient status, additional functional criteria and insurance authorization.  Follow Up Recommendations Skilled nursing-short term rehab (<3 hours/day) Can patient physically be transported by private vehicle: Yes    Assistance Recommended at Discharge Frequent or constant Supervision/Assistance  Patient can return home with the following  A little help with walking and/or transfers;Assistance with cooking/housework;Assist for transportation;Help with stairs or ramp for entrance;A little help with bathing/dressing/bathroom;Direct  supervision/assist for medications management    Equipment Recommendations  (to be determined at next venue of care)  Recommendations for Other Services       Functional Status Assessment Patient has had a recent decline in their functional status and demonstrates the ability to make significant improvements in function in a reasonable and predictable amount of time.     Precautions / Restrictions Precautions Precautions: Fall Restrictions Weight Bearing Restrictions: Yes LLE Weight Bearing: Weight bearing as tolerated      Mobility  Bed Mobility Overal bed mobility: Needs Assistance Bed Mobility: Supine to Sit     Supine to sit: Min assist, HOB elevated     General bed mobility comments: assistance intermittently for LLE support. increased time and effort required    Transfers Overall transfer level: Needs assistance Equipment used: Rolling walker (2 wheels) Transfers: Sit to/from Stand Sit to Stand: Min assist, +2 safety/equipment           General transfer comment: lifting assistance reuqired for standing. cues for safe hand placement to stand and for LLE positioning with sitting for comfort and decreased pain    Ambulation/Gait Ambulation/Gait assistance: Min assist Gait Distance (Feet): 5 Feet Assistive device: Rolling walker (2 wheels) Gait Pattern/deviations: Antalgic, Step-to pattern, Decreased step length - right, Decreased step length - left, Decreased stance time - left Gait velocity: decreased     General Gait Details: heavy UE support on rolling walker. he does accept some weight on LLE with knee flexed. further distance limited by fatigue. cues for technique. patient does report the pain feels improved since surgery yesterday compared to the last time he was out of bed  Stairs            Wheelchair Mobility    Modified Rankin (Stroke Patients Only)       Balance Overall balance assessment: Needs assistance Sitting-balance support: No  upper extremity supported, Feet supported Sitting  balance-Leahy Scale: Good     Standing balance support: Bilateral upper extremity supported, Reliant on assistive device for balance, During functional activity Standing balance-Leahy Scale: Fair                               Pertinent Vitals/Pain Pain Assessment Pain Assessment: Faces Faces Pain Scale: Hurts little more Pain Location: L knee - with movement Pain Descriptors / Indicators: Discomfort, Sore Pain Intervention(s): Limited activity within patient's tolerance, Monitored during session, Repositioned    Home Living Family/patient expects to be discharged to:: Private residence Living Arrangements: Spouse/significant other Available Help at Discharge: Family;Available PRN/intermittently Type of Home: House Home Access: Stairs to enter Entrance Stairs-Rails: Right Entrance Stairs-Number of Steps: 6   Home Layout: Two level;Able to live on main level with bedroom/bathroom Home Equipment: Kasandra Knudsen - single point      Prior Function Prior Level of Function : Independent/Modified Independent             Mobility Comments: household and community mobilization without assist device; endorses recent use of SPC due to progressive L knee pain.  Denies fall history. ADLs Comments: Pt reports Ind with self care and some IADL tasks shared with partner     Hand Dominance   Dominant Hand: Right    Extremity/Trunk Assessment   Upper Extremity Assessment Upper Extremity Assessment: Overall WFL for tasks assessed    Lower Extremity Assessment Lower Extremity Assessment: Generalized weakness;LLE deficits/detail LLE Deficits / Details: AROM left knee ~ 10-70 degrees. pain with knee flexion. patient is able to accept weight without knee buckling but heavy reliance on rolling walker with standing. generalized weakness otherwise LLE Sensation: decreased light touch (self reported)       Communication    Communication: No difficulties  Cognition Arousal/Alertness: Awake/alert Behavior During Therapy: WFL for tasks assessed/performed Overall Cognitive Status: Within Functional Limits for tasks assessed                                 General Comments: patient is following all commands without difficulty. mild decreased safety awareness with mobility        General Comments      Exercises     Assessment/Plan    PT Assessment Patient needs continued PT services; goals updated accordingly   PT Problem List Decreased strength;Decreased range of motion;Decreased activity tolerance;Decreased balance;Decreased mobility;Decreased coordination;Decreased knowledge of use of DME;Decreased safety awareness;Decreased knowledge of precautions;Cardiopulmonary status limiting activity;Pain       PT Treatment Interventions DME instruction;Gait training;Stair training;Functional mobility training;Therapeutic activities;Therapeutic exercise;Balance training;Patient/family education    PT Goals (Current goals can be found in the Care Plan section)  Acute Rehab PT Goals Patient Stated Goal: to go to rehab PT Goal Formulation: With patient Time For Goal Achievement: 10/22/22 Potential to Achieve Goals: Good    Frequency 7X/week     Co-evaluation               AM-PAC PT "6 Clicks" Mobility  Outcome Measure Help needed turning from your back to your side while in a flat bed without using bedrails?: A Little Help needed moving from lying on your back to sitting on the side of a flat bed without using bedrails?: A Little Help needed moving to and from a bed to a chair (including a wheelchair)?: A Little Help needed standing up from a chair using your  arms (e.g., wheelchair or bedside chair)?: A Little Help needed to walk in hospital room?: A Lot Help needed climbing 3-5 steps with a railing? : Total 6 Click Score: 15    End of Session Equipment Utilized During Treatment:  Gait belt Activity Tolerance: Patient limited by fatigue Patient left: in chair;with call bell/phone within reach;with chair alarm set Nurse Communication: Mobility status PT Visit Diagnosis: Muscle weakness (generalized) (M62.81);Difficulty in walking, not elsewhere classified (R26.2);Pain Pain - Right/Left: Left Pain - part of body: Knee    Time: 6825-7493 PT Time Calculation (min) (ACUTE ONLY): 29 min   Charges:   PT Evaluation $PT Re-evaluation: 1 Re-eval PT Treatments $Therapeutic Activity: 8-22 mins        [name], PT, MPT   Percell Locus 10/08/2022, 11:43 AM

## 2022-10-08 NOTE — Progress Notes (Signed)
ID    [name] is a 76 y.o. with a history of ca bladder, urethral stricture, frequent dilatation, bladder diverticulum, MRSA UTI in May 2023, CAD s/p stent, DM -insulin dependent, presents with left knee pain of more than week duration- had been to PCP and got intraarticular steroid injection  On 11/22 , prior to that he had gotten mobic for the pain. HE was also taking Aleve As no relief and was getting worse he came to the ED on 10/02/22- no fever or chills. MRSA bacteremia Left knee infection   subjective Pt doing well Pain left knee improved No fever O/e awake and alert Patient Vitals for the past 24 hrs:  BP Temp Pulse Resp SpO2  10/08/22 1748 (!) 122/59 (!) 97.5 F (36.4 C) 73 16 100 %  10/08/22 0755 (!) 124/58 97.6 F (36.4 C) 74 17 99 %  10/08/22 0050 (!) 111/91 98.2 F (36.8 C) 81 20 97 %   Chest cta Hss1s2 Abd soft Cns non focal Left knee dressing  Labs    Latest Ref Rng & Units 10/08/2022    6:32 AM 10/06/2022    5:28 AM 10/05/2022    5:00 AM  CBC  WBC 4.0 - 10.5 K/uL 16.1  15.1  21.7   Hemoglobin 13.0 - 17.0 g/dL 11.6  11.4  10.8   Hematocrit 39.0 - 52.0 % 35.7  34.2  31.6   Platelets 150 - 400 K/uL 462  459  468        Latest Ref Rng & Units 10/08/2022    6:32 AM 10/06/2022    5:28 AM 10/05/2022    5:00 AM  CMP  Glucose 70 - 99 mg/dL 367  274  369   BUN 8 - 23 mg/dL 34  53  76   Creatinine 0.61 - 1.24 mg/dL 1.06  1.12  1.67   Sodium 135 - 145 mmol/L 141  140  134   Potassium 3.5 - 5.1 mmol/L 4.6  4.2  4.8   Chloride 98 - 111 mmol/L 110  110  103   CO2 22 - 32 mmol/L _0 Calcium 8.9 - 10.3 mg/dL 8.1  8.5  8.5   Total Protein 6.5 - 8.1 g/dL   5.7   Total Bilirubin 0.3 - 1.2 mg/dL   1.1   Alkaline Phos 38 - 126 U/L   100   AST 15 - 41 U/L   14   ALT 0 - 44 U/L   13     Micro 11/23 BC - MRSA 11/25 /23 No growth 10/03/22 knee- MRSA 10/07/22 knee fluid    Impression/recommendation  MRSA bacteremia  MRSA septic knee  s/p I/D x 2 TEE - no endocarditis Repeat blood culture NG so far On daptomycin- will need for 6 weeks   AKI resolved  DM- not well controlled  Non occlusive left peroneal vein thrombus  Ca bladder   Urethral stricture with frequent dilatation MRSA uTI in May 2023  CAD s/p stent HE is on atorvastatin While on dapto watch closely for rhabdomyolysis  Discussed the management with the patient in detail   OPAT  Diagnosis: MRSA bacteremia Left knee septic arthritis    No Known Allergies  Discharge antibiotics: Daptomycin 36m/kg body weight- 7068mIV PB every 24 hours for 6 weeks Duration: 6 weeks End Date:11/15/22   [facility]:  Labs weekly while on IV antibiotics: _X_ CBC with differential  _X_ CMP _X_  CRP _X_ ESR _X_ CK   _X_ Please pull PIC at completion of IV antibiotics   Fax weekly lab results  promptly to [phone]  Clinic Follow Up Appt:10/31/22 at 10.45 AM   Call [phone] with any questions

## 2022-10-08 NOTE — TOC Progression Note (Signed)
Transition of Care [facility]) - Progression Note    Patient Details  Name: [name] MRN: [id number] Date of Birth: 08-05-1946  Transition of Care [facility]) CM/SW [place], RN Phone Number: 10/08/2022, 9:28 AM  Clinical Narrative:    TEE completed, I&D completed, PT to eval, TOC continues to monitor and follow for needs   Expected Discharge Plan: [place] Barriers to Discharge: Continued Medical Work up  Expected Discharge Plan and Services Expected Discharge Plan: [place] arrangements for the past 2 months: Single Family Home                                       Social Determinants of Health (SDOH) Interventions    Readmission Risk Interventions     No data to display

## 2022-10-08 NOTE — Progress Notes (Signed)
PROGRESS NOTE    [name]  QKM:[id number] DOB: 12-14-1945 DOA: 10/02/2022 PCP: [name], MD   Brief Narrative:  This 76 years old male with PMH significant for type 1 diabetes on insulin pump, history of left leg DVT, coronary artery disease s/p stent angioplasty, GERD, history of bladder cancer, history of ureteral stricture s/p dilatation, prior history of MRSA infection presented in the ED for the evaluation of left knee pain which has been there for last 6 days.  Patient reports he has developed left knee pain 6 days ago which has progressively worsened resulting in an inability to bear weight or bend his left knee.  He denies any recent trauma or falls.  He was seen by primary care physician twice and was started on Mobic without any improvement and then he received lidocaine/Kenalog injection 1 day prior to his admission which also shows no improvement in his symptoms.  Patient had an MRI of left knee which shows moderate knee joint effusion with synovial thickening suggesting synovitis. He is also found to have complex tear of posterior horn of medial meniscus. Orthopedics was consulted and patient was taken to the OR for arthroscopy and washout of left knee.  Tolerated well.  Assessment & Plan:   Principal Problem:   Uncontrolled type 1 diabetes mellitus with hyperglycemia, with long-term current use of insulin ([name]) Active Problems:   Septic arthritis of knee, left ([name])   AKI (acute kidney injury) ([place])   DVT (deep venous thrombosis) ([name])   HYPERTENSION, BENIGN   CAD, NATIVE VESSEL   Bacteremia   Cardiomyopathy ([place])   Coronary artery disease involving native coronary artery of native heart  Septic arthritis of left knee: MRSA Bacteremia: Patient presented for the evaluation of worsening left knee pain and swelling . He was unable to bear weight on left leg. He was noted to have marked leukocytosis which could be related to systemic steroid administration.    MRI concerning for septic arthritis and meniscal tear. Orthopedics was consulted. He underwent extensive arthroscopic debridement with partial meniscectomy Empirically started on vancomycin and Rocephin. Begin physical therapy for ambulation and gentle range of motion. Infectious diseases consulted, antibiotics changed to daptomycin given worsening renal functions. TTE showed LVEF 35 to 40%.  No vegetations. Cardiology consulted for TEE and possible cardiology evaluation for reduced LVEF. TEE : No evidence of vegetations. He underwent repeat arthroscopic irrigation and debridement. Continue daptomycin , would discuss duration with Infectious diseases.  Chronic systolic heart failure: It could be acute or acute on chronic CHF likely ischemic cardiomyopathy. TTE shows LVEF 35 to 40% with reduced left ventricular function. He appears euvolemic on exam. TEE: No vegetations Cardiology consulted, started on Entresto.   Continue Coreg and escalate GDMT as tolerated.  Uncontrolled type 1 diabetes mellitus with hyperglycemia: Most likely related to systemic steroid injection the patient has received for left knee pain. Hold insulin pump for now. Continue gentle fluid resuscitation. Continue Semglee 28 units daily with sliding scale coverage. Carb modified diet. Appreciate Diabetic coordinator consult.   Acute kidney injury : > Resolved. Baseline serum creatinine 1.0, presented in the ED with creatinine of 1.38 Unclear etiology of worsening renal functions but could be related to recent NSAID use. Renal functions improved with IV hydration.  Avoid nephrotoxic medications.  Left peroneal vein DVT: Patient noted to have nonocclusive thrombus seen within the left calf peroneal vein. Continue Eliquis.   Coronary artery disease, native vessel. Stable. Continue Coreg, Lipitor, and aspirin.   Essential  hypertension Continue Coreg.   Hypophosphatemia: Replaced.  Continue to monitor.  DVT  prophylaxis: Eliquis Code Status: Full code Family Communication: Partner at bedside Disposition Plan:   Status is: Inpatient Remains inpatient appropriate because: Patient admitted for left knee swelling,  found to have septic arthritis requiring IV antibiotics.   Orthopedics is consulted,  Patient underwent repair of the meniscus tear. Infectious disease recommended TEE to rule out infective endocarditis.  Patient underwent TEE which was negative for vegetations followed by repeat arthroscopic debridement.  Plan anticipated discharge to SNF.   Consultants:  Orthopedics  Procedures: Arthroscopy with debridement with partial meniscectomy Antimicrobials: Anti-infectives (From admission, onward)    Start     Dose/Rate Route Frequency Ordered Stop   10/05/22 1200  DAPTOmycin (CUBICIN) 700 mg in sodium chloride 0.9 % IVPB        8 mg/kg  88.5 kg 128 mL/hr over 30 Minutes Intravenous Daily 10/04/22 1550     10/05/22 0800  vancomycin (VANCOREADY) IVPB 1250 mg/250 mL  Status:  Discontinued        1,250 mg 166.7 mL/hr over 90 Minutes Intravenous Every 24 hours 10/04/22 0840 10/04/22 1550   10/04/22 1600  vancomycin (VANCOREADY) IVPB 1250 mg/250 mL  Status:  Discontinued        1,250 mg 166.7 mL/hr over 90 Minutes Intravenous Every 24 hours 10/03/22 1359 10/04/22 0746   10/04/22 0800  vancomycin (VANCOREADY) IVPB 2000 mg/400 mL        2,000 mg 200 mL/hr over 120 Minutes Intravenous  Once 10/04/22 0747 10/04/22 1026   10/03/22 0815  vancomycin (VANCOREADY) IVPB 2000 mg/400 mL  Status:  Discontinued        2,000 mg 200 mL/hr over 120 Minutes Intravenous  Once 10/03/22 0811 10/04/22 0746   10/03/22 0800  cefTRIAXone (ROCEPHIN) 2 g in sodium chloride 0.9 % 100 mL IVPB  Status:  Discontinued        2 g 200 mL/hr over 30 Minutes Intravenous Every 24 hours 10/03/22 0754 10/04/22 0159       Subjective: Patient was seen and examined at bedside.Overnight events noted. Patient reports pain is  improved.   He is s/p TEE and repeat arthroscopic debridement of left knee.  Objective: Vitals:   10/07/22 1745 10/07/22 1800 10/08/22 0050 10/08/22 0755  BP: (!) 144/67 (!) 142/68 (!) 111/91 (!) 124/58  Pulse: 78 79 81 74  Resp: '17 19 20 17  '$ Temp:  98 F (36.7 C) 98.2 F (36.8 C) 97.6 F (36.4 C)  TempSrc:      SpO2: 96% 96% 97% 99%  Weight:      Height:        Intake/Output Summary (Last 24 hours) at 10/08/2022 1304 Last data filed at 10/08/2022 1132 Gross per 24 hour  Intake 1975.42 ml  Output 1115 ml  Net 860.42 ml   Filed Weights   10/02/22 2229 10/07/22 1448  Weight: 88.5 kg 88.5 kg    Examination:  General exam: Appears comfortable, not in any acute distress, deconditioned. Respiratory system: CTA bilaterally, respiratory effort normal, RR 13. Cardiovascular system: S1-S2 heard, regular rate and rhythm, no murmur. Gastrointestinal system: Abdomen is soft, non tender, non distended, BS+ Central nervous system: Alert and oriented x3, no focal neurological deficits. Extremities: Left knee tenderness noted, left knee covered in dressing. Skin: No rashes, lesions or ulcers Psychiatry: Judgement and insight appear normal. Mood & affect appropriate.     Data Reviewed: I have personally reviewed following labs and imaging  studies  CBC: Recent Labs  Lab 10/03/22 0658 10/04/22 0459 10/05/22 0500 10/06/22 0528 10/08/22 0632  WBC 20.5* 23.9* 21.7* 15.1* 16.1*  NEUTROABS 17.1*  --   --   --   --   HGB 10.5* 11.3* 10.8* 11.4* 11.6*  HCT 32.0* 33.8* 31.6* 34.2* 35.7*  MCV 88.6 88.5 87.5 87.7 89.7  PLT 398 470* 468* 459* 619*   Basic Metabolic Panel: Recent Labs  Lab 10/03/22 0658 10/04/22 0459 10/05/22 0500 10/06/22 0528 10/08/22 0632  NA 136 137 134* 140 141  K 4.6 5.3*  5.2* 4.8 4.2 4.6  CL 103 102 103 110 110  CO2 18* 19* 21* 23 20*  GLUCOSE 390* 515* 369* 274* 367*  BUN 59* 72* 76* 53* 34*  CREATININE 1.38* 1.52* 1.67* 1.12 1.06  CALCIUM 9.1  8.9 8.5* 8.5* 8.1*  MG  --   --  2.2 2.3 2.3  PHOS  --   --  3.0 1.8* 3.4   GFR: Estimated Creatinine Clearance: 66.3 mL/min (by C-G formula based on SCr of 1.06 mg/dL). Liver Function Tests: Recent Labs  Lab 10/03/22 0658 10/05/22 0500  AST 19 14*  ALT 18 13  ALKPHOS 100 100  BILITOT 1.9* 1.1  PROT 6.6 5.7*  ALBUMIN 2.7* 2.2*   No results for input(s): "LIPASE", "AMYLASE" in the last 168 hours. No results for input(s): "AMMONIA" in the last 168 hours. Coagulation Profile: No results for input(s): "INR", "PROTIME" in the last 168 hours. Cardiac Enzymes: Recent Labs  Lab 10/04/22 0900 10/05/22 0500  CKTOTAL 82 49   BNP (last 3 results) No results for input(s): "PROBNP" in the last 8760 hours. HbA1C: No results for input(s): "HGBA1C" in the last 72 hours.  CBG: Recent Labs  Lab 10/07/22 1702 10/07/22 1733 10/07/22 2301 10/08/22 0816 10/08/22 1131  GLUCAP 228* 287* 297* 342* 341*   Lipid Profile: No results for input(s): "CHOL", "HDL", "LDLCALC", "TRIG", "CHOLHDL", "LDLDIRECT" in the last 72 hours. Thyroid Function Tests: No results for input(s): "TSH", "T4TOTAL", "FREET4", "T3FREE", "THYROIDAB" in the last 72 hours. Anemia Panel: No results for input(s): "VITAMINB12", "FOLATE", "FERRITIN", "TIBC", "IRON", "RETICCTPCT" in the last 72 hours. Sepsis Labs: Recent Labs  Lab 10/03/22 0906 10/03/22 1500  LATICACIDVEN 1.9 1.6    Recent Results (from the past 240 hour(s))  Blood Culture (routine x 2)     Status: Abnormal   Collection Time: 10/03/22  9:06 AM   Specimen: BLOOD  Result Value Ref Range Status   Specimen Description   Final    BLOOD RIGHT [facility] Performed at [facility], [address] [place], [place] [address]    Special Requests   Final    BOTTLES DRAWN AEROBIC AND ANAEROBIC Blood Culture adequate volume Performed at [facility], [address], [place], [place] [address]    Culture  Setup Time   Final    GRAM POSITIVE  COCCI IN BOTH AEROBIC AND ANAEROBIC BOTTLES CRITICAL RESULT CALLED TO, READ BACK BY AND VERIFIED WITH: Violeta Gelinas PHARMD AT 5809 10/04/2022 GAA    Culture METHICILLIN RESISTANT STAPHYLOCOCCUS AUREUS (A)  Final   Report Status 10/06/2022 FINAL  Final   Organism ID, Bacteria METHICILLIN RESISTANT STAPHYLOCOCCUS AUREUS  Final      Susceptibility   Methicillin resistant staphylococcus aureus - MIC*    CIPROFLOXACIN >=8 RESISTANT Resistant     ERYTHROMYCIN >=8 RESISTANT Resistant     GENTAMICIN <=0.5 SENSITIVE Sensitive     OXACILLIN >=4 RESISTANT Resistant  TETRACYCLINE <=1 SENSITIVE Sensitive     VANCOMYCIN 1 SENSITIVE Sensitive     TRIMETH/SULFA <=10 SENSITIVE Sensitive     CLINDAMYCIN <=0.25 SENSITIVE Sensitive     RIFAMPIN <=0.5 SENSITIVE Sensitive     Inducible Clindamycin NEGATIVE Sensitive     * METHICILLIN RESISTANT STAPHYLOCOCCUS AUREUS  Blood Culture (routine x 2)     Status: Abnormal   Collection Time: 10/03/22  9:06 AM   Specimen: BLOOD  Result Value Ref Range Status   Specimen Description   Final    BLOOD RIGTH [facility] Performed at [facility], [address], [place], [place] [address]    Special Requests   Final    BOTTLES DRAWN AEROBIC AND ANAEROBIC Blood Culture adequate volume Performed at [facility], [address] [place], [place] [address]    Culture  Setup Time   Final    GRAM POSITIVE COCCI IN BOTH AEROBIC AND ANAEROBIC BOTTLES GRAM STAIN REVIEWED-AGREE WITH RESULT CRITICAL VALUE NOTED.  VALUE IS CONSISTENT WITH PREVIOUSLY REPORTED AND CALLED VALUE. Performed at [facility], [address] [place], [place] [address]    Culture (A)  Final    STAPHYLOCOCCUS AUREUS SUSCEPTIBILITIES PERFORMED ON PREVIOUS CULTURE WITHIN THE LAST 5 DAYS. Performed at [facility], [place] [address] [place], [place] [address]    Report Status 10/06/2022 FINAL  Final  Blood Culture ID Panel (Reflexed)     Status: Abnormal   Collection  Time: 10/03/22  9:06 AM  Result Value Ref Range Status   Enterococcus faecalis NOT DETECTED NOT DETECTED Final   Enterococcus Faecium NOT DETECTED NOT DETECTED Final   Listeria monocytogenes NOT DETECTED NOT DETECTED Final   Staphylococcus species DETECTED (A) NOT DETECTED Final    Comment: CRITICAL RESULT CALLED TO, READ BACK BY AND VERIFIED WITH: Violeta Gelinas PHARMD AT 0150 10/04/2022 GAA    Staphylococcus aureus (BCID) DETECTED (A) NOT DETECTED Final    Comment: Methicillin (oxacillin)-resistant Staphylococcus aureus (MRSA). MRSA is predictably resistant to beta-lactam antibiotics (except ceftaroline). Preferred therapy is vancomycin unless clinically contraindicated. Patient requires contact precautions if  hospitalized. CRITICAL RESULT CALLED TO, READ BACK BY AND VERIFIED WITH: Violeta Gelinas PHARMD AT 2353 10/04/2022 GAA    Staphylococcus epidermidis NOT DETECTED NOT DETECTED Final   Staphylococcus lugdunensis NOT DETECTED NOT DETECTED Final   Streptococcus species NOT DETECTED NOT DETECTED Final   Streptococcus agalactiae NOT DETECTED NOT DETECTED Final   Streptococcus pneumoniae NOT DETECTED NOT DETECTED Final   Streptococcus pyogenes NOT DETECTED NOT DETECTED Final   A.calcoaceticus-baumannii NOT DETECTED NOT DETECTED Final   Bacteroides fragilis NOT DETECTED NOT DETECTED Final   Enterobacterales NOT DETECTED NOT DETECTED Final   Enterobacter cloacae complex NOT DETECTED NOT DETECTED Final   Escherichia coli NOT DETECTED NOT DETECTED Final   Klebsiella aerogenes NOT DETECTED NOT DETECTED Final   Klebsiella oxytoca NOT DETECTED NOT DETECTED Final   Klebsiella pneumoniae NOT DETECTED NOT DETECTED Final   Proteus species NOT DETECTED NOT DETECTED Final   Salmonella species NOT DETECTED NOT DETECTED Final   Serratia marcescens NOT DETECTED NOT DETECTED Final   Haemophilus influenzae NOT DETECTED NOT DETECTED Final   Neisseria meningitidis NOT DETECTED NOT DETECTED Final    Pseudomonas aeruginosa NOT DETECTED NOT DETECTED Final   Stenotrophomonas maltophilia NOT DETECTED NOT DETECTED Final   Candida albicans NOT DETECTED NOT DETECTED Final   Candida auris NOT DETECTED NOT DETECTED Final   Candida glabrata NOT DETECTED NOT DETECTED Final   Candida  krusei NOT DETECTED NOT DETECTED Final   Candida parapsilosis NOT DETECTED NOT DETECTED Final   Candida tropicalis NOT DETECTED NOT DETECTED Final   Cryptococcus neoformans/gattii NOT DETECTED NOT DETECTED Final   Meth resistant mecA/C and MREJ DETECTED (A) NOT DETECTED Final    Comment: CRITICAL RESULT CALLED TO, READ BACK BY AND VERIFIED WITHVioleta Gelinas PHARMD AT 8315 10/04/2022 GAA Performed at [facility], [address] [place], [place] [address]   Aerobic/Anaerobic Culture w Gram Stain (surgical/deep wound)     Status: None   Collection Time: 10/03/22 11:45 AM   Specimen: PATH Cytology Misc. fluid; Body Fluid  Result Value Ref Range Status   Specimen Description   Final    KNEE LEFT Performed at [facility], [address] [place], [place] [address]    Special Requests   Final    NONE Performed at [facility], [address] [place], [place] [address]    Gram Stain   Final    ABUNDANT WBC PRESENT, PREDOMINANTLY PMN MODERATE GRAM POSITIVE COCCI IN PAIRS IN CLUSTERS Gram Stain Report Called to,Read Back By and Verified With:  Doretha Sou MCWHITE, RN 10/04/22 0100 A. LAFRANCE    Culture   Final    ABUNDANT METHICILLIN RESISTANT STAPHYLOCOCCUS AUREUS NO ANAEROBES ISOLATED Performed at [facility], [place] [address], [place], [place] [address]    Report Status 10/08/2022 FINAL  Final   Organism ID, Bacteria METHICILLIN RESISTANT STAPHYLOCOCCUS AUREUS  Final      Susceptibility   Methicillin resistant staphylococcus aureus - MIC*    CIPROFLOXACIN >=8 RESISTANT Resistant     ERYTHROMYCIN >=8 RESISTANT Resistant     GENTAMICIN <=0.5 SENSITIVE Sensitive      OXACILLIN >=4 RESISTANT Resistant     TETRACYCLINE <=1 SENSITIVE Sensitive     VANCOMYCIN 1 SENSITIVE Sensitive     TRIMETH/SULFA <=10 SENSITIVE Sensitive     CLINDAMYCIN <=0.25 SENSITIVE Sensitive     RIFAMPIN <=0.5 SENSITIVE Sensitive     Inducible Clindamycin NEGATIVE Sensitive     * ABUNDANT METHICILLIN RESISTANT STAPHYLOCOCCUS AUREUS  Culture, blood (Routine X 2) w Reflex to ID Panel     Status: None (Preliminary result)   Collection Time: 10/05/22  4:59 AM   Specimen: BLOOD RIGHT WRIST  Result Value Ref Range Status   Specimen Description BLOOD RIGHT WRIST  Final   Special Requests Blood Culture adequate volume  Final   Culture   Final    NO GROWTH 3 DAYS Performed at [facility], [address] [place], [place] [address]    Report Status PENDING  Incomplete  Culture, blood (Routine X 2) w Reflex to ID Panel     Status: None (Preliminary result)   Collection Time: 10/05/22  7:04 AM   Specimen: BLOOD LEFT HAND  Result Value Ref Range Status   Specimen Description BLOOD LEFT HAND  Final   Special Requests Blood Culture adequate volume  Final   Culture   Final    NO GROWTH 3 DAYS Performed at [facility], [address] [place], [place] [address]    Report Status PENDING  Incomplete  Aerobic/Anaerobic Culture w Gram Stain (surgical/deep wound)     Status: None (Preliminary result)   Collection Time: 10/07/22  4:22 PM   Specimen: PATH Cytology Misc. fluid; Body Fluid  Result Value Ref Range Status   Specimen Description   Final    WOUND Performed at [facility], [place]  [address] [place], [place] [address]    Special Requests   Final    LEFT KNEE Performed at [facility], [address] [place], [place] [address]    Gram Stain   Final    MODERATE WBC PRESENT, PREDOMINANTLY PMN FEW GRAM POSITIVE COCCI IN PAIRS    Culture   Final    TOO YOUNG TO READ Performed at [facility], [place] [address] [place], [place]  [address]    Report Status PENDING  Incomplete    Radiology Studies: ECHO TEE  Result Date: 10/07/2022    TRANSESOPHOGEAL ECHO REPORT   Patient Name:   [name] Hebel Date of Exam: 10/07/2022 Medical Rec #:  [id number]            Height:       69.0 in Accession #:    [id number]           Weight:       195.0 lb Date of Birth:  1946-01-02            BSA:          2.044 m Patient Age:    46 years             BP:           140/64 mmHg Patient Gender: M                    HR:           79 bpm. Exam Location:  [facility] Procedure: Transesophageal Echo, Color Doppler and Cardiac Doppler Indications:     Bacteremia R78.81  History:         Patient has prior history of Echocardiogram examinations, most                  recent 10/05/2022. Ischemic cardiomyopathy, CAD; Risk                  Factors:Hypertension and Dyslipidemia.  Sonographer:     Sherrie Sport Referring Phys:  [id number] Kate Sable Diagnosing Phys: Kate Sable MD PROCEDURE: The transesophogeal probe was passed without difficulty through the esophogus of the patient. Sedation performed by performing physician. The patient developed no complications during the procedure.  IMPRESSIONS  1. Left ventricular ejection fraction, by estimation, is 35 to 40%. The left ventricle has moderately decreased function.  2. Right ventricular systolic function is normal. The right ventricular size is normal.  3. No left atrial/left atrial appendage thrombus was detected.  4. The mitral valve is normal in structure. No evidence of mitral valve regurgitation.  5. The aortic valve is tricuspid. Aortic valve regurgitation is not visualized. Conclusion(s)/Recommendation(s): No evidence of vegetation/infective endocarditis on this transesophageael echocardiogram. FINDINGS  Left Ventricle: Left ventricular ejection fraction, by estimation, is 35 to 40%. The left ventricle has moderately decreased function. The left ventricular internal cavity size was normal in size. Right  Ventricle: The right ventricular size is normal. No increase in right ventricular wall thickness. Right ventricular systolic function is normal. Left Atrium: Left atrial size was normal in size. No left atrial/left atrial appendage thrombus was detected. Right Atrium: Right atrial size was normal in size. Prominent Chiari network. Pericardium: There is no evidence of pericardial effusion. Mitral Valve: The mitral valve is normal in structure. No evidence of mitral valve regurgitation. Tricuspid Valve: The tricuspid valve is normal in structure. Tricuspid valve regurgitation is trivial. Aortic Valve: The aortic valve is tricuspid. Aortic valve  regurgitation is not visualized. Pulmonic Valve: The pulmonic valve was normal in structure. Pulmonic valve regurgitation is not visualized. Aorta: The aortic root is normal in size and structure. IAS/Shunts: No atrial level shunt detected by color flow Doppler. Kate Sable MD Electronically signed by Kate Sable MD Signature Date/Time: 10/07/2022/3:51:18 PM    Final     Scheduled Meds:  acetaminophen  1,000 mg Oral Q6H   apixaban  10 mg Oral BID   Followed by   Derrill Memo ON 10/14/2022] apixaban  5 mg Oral BID   atorvastatin  40 mg Oral Daily   bisacodyl  5 mg Oral BID   carvedilol  6.25 mg Oral BID WC   cyanocobalamin  1,000 mcg Oral QODAY   dapagliflozin propanediol  10 mg Oral Daily   docusate sodium  200 mg Oral BID   famotidine  20 mg Oral BID   insulin aspart  0-20 Units Subcutaneous TID WC   insulin aspart  6 Units Subcutaneous TID WC   insulin glargine-yfgn  28 Units Subcutaneous Daily   ketorolac  7.5 mg Intravenous Q6H   multivitamin with minerals  1 tablet Oral Daily   sacubitril-valsartan  1 tablet Oral BID   senna  1 tablet Oral BID   Continuous Infusions:  sodium chloride 75 mL/hr at 10/08/22 0240   DAPTOmycin (CUBICIN) 700 mg in sodium chloride 0.9 % IVPB Stopped (10/07/22 2333)     LOS: 5 days     Time spent: 35  mins    Zuley Lutter, MD [facility]   If 7PM-7AM, please contact night-coverage

## 2022-10-09 ENCOUNTER — Inpatient Hospital Stay: Payer: Self-pay

## 2022-10-09 DIAGNOSIS — I429 Cardiomyopathy, unspecified: Secondary | ICD-10-CM | POA: Diagnosis not present

## 2022-10-09 DIAGNOSIS — E1065 Type 1 diabetes mellitus with hyperglycemia: Secondary | ICD-10-CM | POA: Diagnosis not present

## 2022-10-09 DIAGNOSIS — I2510 Atherosclerotic heart disease of native coronary artery without angina pectoris: Secondary | ICD-10-CM | POA: Diagnosis not present

## 2022-10-09 DIAGNOSIS — R7881 Bacteremia: Secondary | ICD-10-CM | POA: Diagnosis not present

## 2022-10-09 DIAGNOSIS — B9562 Methicillin resistant Staphylococcus aureus infection as the cause of diseases classified elsewhere: Secondary | ICD-10-CM | POA: Diagnosis not present

## 2022-10-09 LAB — GLUCOSE, CAPILLARY
Glucose-Capillary: 352 mg/dL — ABNORMAL HIGH (ref 70–99)
Glucose-Capillary: 380 mg/dL — ABNORMAL HIGH (ref 70–99)
Glucose-Capillary: 283 mg/dL — ABNORMAL HIGH (ref 70–99)
Glucose-Capillary: 180 mg/dL — ABNORMAL HIGH (ref 70–99)

## 2022-10-09 LAB — CBC
HCT: 37.7 % — ABNORMAL LOW (ref 39.0–52.0)
Hemoglobin: 12.4 g/dL — ABNORMAL LOW (ref 13.0–17.0)
MCH: 29.2 pg (ref 26.0–34.0)
MCHC: 32.9 g/dL (ref 30.0–36.0)
MCV: 88.7 fL (ref 80.0–100.0)
Platelets: 546 10*3/uL — ABNORMAL HIGH (ref 150–400)
RBC: 4.25 MIL/uL (ref 4.22–5.81)
RDW: 15 % (ref 11.5–15.5)
WBC: 18.3 10*3/uL — ABNORMAL HIGH (ref 4.0–10.5)
nRBC: 0 % (ref 0.0–0.2)

## 2022-10-09 MED ORDER — SODIUM CHLORIDE 0.9% FLUSH: 10.0000 mL | INTRAVENOUS | Status: DC | PRN

## 2022-10-09 MED ORDER — SODIUM CHLORIDE 0.9% FLUSH
10.0000 mL | Freq: Two times a day (BID) | INTRAVENOUS | Status: DC
  Administered 2022-10-09 – 2022-10-14 (×10): 10 mL

## 2022-10-09 MED ORDER — CHLORHEXIDINE GLUCONATE CLOTH 2 % EX PADS
6.0000 | MEDICATED_PAD | Freq: Every day | CUTANEOUS | Status: DC
  Administered 2022-10-09 – 2022-10-13 (×5): 6 via TOPICAL

## 2022-10-09 MED ORDER — FUROSEMIDE 10 MG/ML IJ SOLN
40.0000 mg | Freq: Every day | INTRAMUSCULAR | Status: DC
  Administered 2022-10-09 – 2022-10-11 (×3): 40 mg via INTRAVENOUS
  Filled 2022-10-09 (×3): qty 4

## 2022-10-09 NOTE — Progress Notes (Signed)
Physical Therapy Treatment Patient Details Name: [name] MRN: [id number] DOB: May 09, 1946 Today's Date: 10/09/2022   History of Present Illness Patient is a 76 year old male presented to ER and admitted secondary to progressive L knee pain, s/p arthroscopic debridement, partial menisectomy (10/03/22); hospital stay complicated by uncontrolled DM. Nonocclusive left peroneal DVT with history of left lower extremity DVT. Patient underwent Arthroscopic irrigation and debridement, left knee for septic arthritis on 10/07/22    PT Comments    Pt was sitting in recliner upon arriving. Agrees to PT session and is cooperative throughout. Does have some cognition concerns. Greatly improved from past Saturday however author questions if he understand full scope of situation. He was motivated and agreeable to ambulation. Continues to require assistance to stand, ambulate, and perform all ADLs. Recommend DC to STR to maximize independence while decreasing caregiver burden. Acute PT will continue to follow per current POC.    Recommendations for follow up therapy are one component of a multi-disciplinary discharge planning process, led by the attending physician.  Recommendations may be updated based on patient status, additional functional criteria and insurance authorization.  Follow Up Recommendations  Skilled nursing-short term rehab (<3 hours/day) Can patient physically be transported by private vehicle: Yes   Assistance Recommended at Discharge Frequent or constant Supervision/Assistance  Patient can return home with the following A little help with walking and/or transfers;Assistance with cooking/housework;Assist for transportation;Help with stairs or ramp for entrance;A little help with bathing/dressing/bathroom;Direct supervision/assist for medications management   Equipment Recommendations  Other (comment)       Precautions / Restrictions Precautions Precautions:  Fall Restrictions Weight Bearing Restrictions: Yes LLE Weight Bearing: Weight bearing as tolerated     Mobility  Bed Mobility  General bed mobility comments: In recliner pre/post session    Transfers Overall transfer level: Needs assistance Equipment used: Rolling walker (2 wheels) Transfers: Sit to/from Stand Sit to Stand: Min assist   Ambulation/Gait Ambulation/Gait assistance: Min assist Gait Distance (Feet): 40 Feet Assistive device: Rolling walker (2 wheels) Gait Pattern/deviations: Step-to pattern, Antalgic Gait velocity: decreased     General Gait Details: pt was able to ambulate 40 ft with RW + slow antalgic step to gait pattern.    Balance Overall balance assessment: Needs assistance Sitting-balance support: No upper extremity supported, Feet supported Sitting balance-Leahy Scale: Good     Standing balance support: Bilateral upper extremity supported, Reliant on assistive device for balance, During functional activity Standing balance-Leahy Scale: Fair       Cognition Arousal/Alertness: Awake/alert Behavior During Therapy: WFL for tasks assessed/performed Overall Cognitive Status: Within Functional Limits for tasks assessed Area of Impairment: Attention, Following commands, Safety/judgement, Awareness, Problem solving        Current Attention Level: Focused     Safety/Judgement: Decreased awareness of safety, Decreased awareness of deficits Awareness: Intellectual Problem Solving: Slow processing, Difficulty sequencing, Requires verbal cues General Comments: pt is alert and follows commands however author questions if pt fully aware of situation               Pertinent Vitals/Pain Pain Assessment Pain Assessment: 0-10 Pain Score: 6  Pain Location: L knee - with movement Pain Descriptors / Indicators: Discomfort, Sore Pain Intervention(s): Limited activity within patient's tolerance, Monitored during session, Premedicated before session,  Repositioned     PT Goals (current goals can now be found in the care plan section) Acute Rehab PT Goals Patient Stated Goal: to go to rehab Progress towards PT goals: Progressing toward goals  Frequency    7X/week      PT Plan Current plan remains appropriate       AM-PAC PT "6 Clicks" Mobility   Outcome Measure  Help needed turning from your back to your side while in a flat bed without using bedrails?: A Little Help needed moving from lying on your back to sitting on the side of a flat bed without using bedrails?: A Little Help needed moving to and from a bed to a chair (including a wheelchair)?: A Lot Help needed standing up from a chair using your arms (e.g., wheelchair or bedside chair)?: A Lot Help needed to walk in hospital room?: A Little Help needed climbing 3-5 steps with a railing? : Total 6 Click Score: 14    End of Session Equipment Utilized During Treatment: Gait belt Activity Tolerance: Patient limited by fatigue Patient left: in chair;with call bell/phone within reach;with chair alarm set Nurse Communication: Mobility status PT Visit Diagnosis: Muscle weakness (generalized) (M62.81);Difficulty in walking, not elsewhere classified (R26.2);Pain Pain - Right/Left: Left Pain - part of body: Knee     Time: 1201-1221 PT Time Calculation (min) (ACUTE ONLY): 20 min  Charges:  $Gait Training: 8-22 mins                    Julaine Fusi PTA 10/09/22, 12:40 PM

## 2022-10-09 NOTE — Progress Notes (Signed)
Rounding Note    Patient Name: [name] Date of Encounter: 10/09/2022  [place] Cardiologist: Ida Rogue, MD   Subjective   Denies chest pain or shortness of breath.  Still has significant left knee pain.  PICC line being planned by primary team for extended IV antibiotic treatment.  Inpatient Medications    Scheduled Meds:  apixaban  10 mg Oral BID   Followed by   Derrill Memo ON 10/14/2022] apixaban  5 mg Oral BID   atorvastatin  40 mg Oral Daily   bisacodyl  5 mg Oral BID   carvedilol  6.25 mg Oral BID WC   cyanocobalamin  1,000 mcg Oral QODAY   docusate sodium  200 mg Oral BID   famotidine  20 mg Oral BID   furosemide  40 mg Intravenous Daily   insulin aspart  0-20 Units Subcutaneous TID WC   insulin aspart  6 Units Subcutaneous TID WC   insulin glargine-yfgn  28 Units Subcutaneous Daily   multivitamin with minerals  1 tablet Oral Daily   sacubitril-valsartan  1 tablet Oral BID   senna  1 tablet Oral BID   Continuous Infusions:  DAPTOmycin (CUBICIN) 700 mg in sodium chloride 0.9 % IVPB 700 mg (10/08/22 2358)   PRN Meds: acetaminophen, bisacodyl, diphenhydrAMINE, hyoscyamine, magnesium hydroxide, metoCLOPramide **OR** metoCLOPramide (REGLAN) injection, morphine injection, ondansetron **OR** ondansetron (ZOFRAN) IV, mouth rinse, oxyCODONE, phenol, sodium phosphate   Vital Signs    Vitals:   10/08/22 0755 10/08/22 1748 10/08/22 2347 10/09/22 0749  BP: (!) 124/58 (!) 122/59 112/67 111/62  Pulse: 74 73 81 89  Resp: '17 16 20   '$ Temp: 97.6 F (36.4 C) (!) 97.5 F (36.4 C) 97.9 F (36.6 C) 98.8 F (37.1 C)  TempSrc:   Oral   SpO2: 99% 100% 96% 100%  Weight:      Height:        Intake/Output Summary (Last 24 hours) at 10/09/2022 1106 Last data filed at 10/09/2022 0813 Gross per 24 hour  Intake 1593.88 ml  Output 1060 ml  Net 533.88 ml      10/07/2022    2:48 PM 10/02/2022   10:29 PM 08/06/2022    5:44 AM  Last 3 Weights  Weight  (lbs) 195 lb 1.7 oz 195 lb 198 lb 8 oz  Weight (kg) 88.5 kg 88.451 kg 90.039 kg      Telemetry    Currently off telemetry- Personally Reviewed  ECG     - Personally Reviewed  Physical Exam   GEN: No acute distress.   Neck: No JVD Cardiac: RRR, no murmurs, rubs, or gallops.  Respiratory: Clear to auscultation bilaterally. GI: Soft, nontender, non-distended  MS: 1-2+ edema; left knee tenderness, dressing noted. Neuro:  Nonfocal  Psych: Normal affect   Labs    High Sensitivity Troponin:  No results for input(s): "TROPONINIHS" in the last 720 hours.   Chemistry Recent Labs  Lab 10/03/22 0658 10/04/22 0459 10/05/22 0500 10/06/22 0528 10/08/22 0632  NA 136   < > 134* 140 141  K 4.6   < > 4.8 4.2 4.6  CL 103   < > 103 110 110  CO2 18*   < > 21* 23 20*  GLUCOSE 390*   < > 369* 274* 367*  BUN 59*   < > 76* 53* 34*  CREATININE 1.38*   < > 1.67* 1.12 1.06  CALCIUM 9.1   < > 8.5* 8.5* 8.1*  MG  --   --  2.2 2.3 2.3  PROT 6.6  --  5.7*  --   --   ALBUMIN 2.7*  --  2.2*  --   --   AST 19  --  14*  --   --   ALT 18  --  13  --   --   ALKPHOS 100  --  100  --   --   BILITOT 1.9*  --  1.1  --   --   GFRNONAA 53*   < > 42* >60 >60  ANIONGAP 15   < > '10 7 11   '$ < > = values in this interval not displayed.    Lipids No results for input(s): "CHOL", "TRIG", "HDL", "LABVLDL", "LDLCALC", "CHOLHDL" in the last 168 hours.  Hematology Recent Labs  Lab 10/06/22 0528 10/08/22 0632 10/09/22 0822  WBC 15.1* 16.1* 18.3*  RBC 3.90* 3.98* 4.25  HGB 11.4* 11.6* 12.4*  HCT 34.2* 35.7* 37.7*  MCV 87.7 89.7 88.7  MCH 29.2 29.1 29.2  MCHC 33.3 32.5 32.9  RDW 14.2 14.7 15.0  PLT 459* 462* 546*   Thyroid No results for input(s): "TSH", "FREET4" in the last 168 hours.  BNPNo results for input(s): "BNP", "PROBNP" in the last 168 hours.  DDimer  Recent Labs  Lab 10/02/22 1530  DDIMER 3.38*     Radiology    [place] EKG SITE RITE  Result Date: 10/09/2022 If Site Rite image not  attached, placement could not be confirmed due to current cardiac rhythm.  ECHO TEE  Result Date: 10/07/2022    TRANSESOPHOGEAL ECHO REPORT   Patient Name:   [name] Date of Exam: 10/07/2022 Medical Rec #:  [id number]            Height:       69.0 in Accession #:    [id number]           Weight:       195.0 lb Date of Birth:  May 07, 1946            BSA:          2.044 m Patient Age:    75 years             BP:           140/64 mmHg Patient Gender: M                    HR:           79 bpm. Exam Location:  [facility] Procedure: Transesophageal Echo, Color Doppler and Cardiac Doppler Indications:     Bacteremia R78.81  History:         Patient has prior history of Echocardiogram examinations, most                  recent 10/05/2022. Ischemic cardiomyopathy, CAD; Risk                  Factors:Hypertension and Dyslipidemia.  Sonographer:     Sherrie Sport Referring Phys:  [id number] Kate Sable Diagnosing Phys: Kate Sable MD PROCEDURE: The transesophogeal probe was passed without difficulty through the esophogus of the patient. Sedation performed by performing physician. The patient developed no complications during the procedure.  IMPRESSIONS  1. Left ventricular ejection fraction, by estimation, is 35 to 40%. The left ventricle has moderately decreased function.  2. Right ventricular systolic function is normal. The right ventricular size is normal.  3. No left atrial/left atrial appendage thrombus was  detected.  4. The mitral valve is normal in structure. No evidence of mitral valve regurgitation.  5. The aortic valve is tricuspid. Aortic valve regurgitation is not visualized. Conclusion(s)/Recommendation(s): No evidence of vegetation/infective endocarditis on this transesophageael echocardiogram. FINDINGS  Left Ventricle: Left ventricular ejection fraction, by estimation, is 35 to 40%. The left ventricle has moderately decreased function. The left ventricular internal cavity size was normal in size.  Right Ventricle: The right ventricular size is normal. No increase in right ventricular wall thickness. Right ventricular systolic function is normal. Left Atrium: Left atrial size was normal in size. No left atrial/left atrial appendage thrombus was detected. Right Atrium: Right atrial size was normal in size. Prominent Chiari network. Pericardium: There is no evidence of pericardial effusion. Mitral Valve: The mitral valve is normal in structure. No evidence of mitral valve regurgitation. Tricuspid Valve: The tricuspid valve is normal in structure. Tricuspid valve regurgitation is trivial. Aortic Valve: The aortic valve is tricuspid. Aortic valve regurgitation is not visualized. Pulmonic Valve: The pulmonic valve was normal in structure. Pulmonic valve regurgitation is not visualized. Aorta: The aortic root is normal in size and structure. IAS/Shunts: No atrial level shunt detected by color flow Doppler. Kate Sable MD Electronically signed by Kate Sable MD Signature Date/Time: 10/07/2022/3:51:18 PM    Final     Cardiac Studies   TEE 09/2022  1. Left ventricular ejection fraction, by estimation, is 35 to 40%. The  left ventricle has moderately decreased function.   2. Right ventricular systolic function is normal. The right ventricular  size is normal.   3. No left atrial/left atrial appendage thrombus was detected.   4. The mitral valve is normal in structure. No evidence of mitral valve  regurgitation.   5. The aortic valve is tricuspid. Aortic valve regurgitation is not  visualized.   Patient Profile     76 y.o. male with history of CAD/PCI to LAD 2008, diabetes, hypertension presenting with left knee pain, diagnosed with septic arthritis and MRSA bacteremia.  Being seen for reduced ejection fraction, EF35 to 40%.  Assessment & Plan    MRSA bacteremia, septic arthritis -TEE with no endocarditis. -PICC line, IV antibiotics as outpatient being planned -Antibiotics as per  primary team and ID   2.  Cardiomyopathy EF 35-40% -Leg edema -Start Lasix IV 40 mg daily.  Will need oral Lasix upon discharge. -Coreg 6.25 mg twice daily, Entresto 24-26 mg twice daily -Plan right and left heart cath after resolution of bacteremia/IV antibiotic treatment.  Will be planned as outpatient.   3.  CAD/PCI to LAD in 2008 -Denies chest pain or shortness of breath -Aspirin, Lipitor -Ischemic workup as outpatient.   4.  Left peroneal vein DVT -On therapeutic Eliquis as per medicine team.  Total encounter time more than 50 minutes  Greater than 50% was spent in counseling and coordination of care with the patient      Signed, Kate Sable, MD  10/09/2022, 11:06 AM

## 2022-10-09 NOTE — Progress Notes (Signed)
PHARMACY CONSULT NOTE FOR:  OUTPATIENT  PARENTERAL ANTIBIOTIC THERAPY (OPAT)  Indication: MRSA bacteremia and knee septic arthritis Regimen: Daptomycin 766m IV q24h  End date: 11/15/2022  Labs - Once weekly:  CBC/D, CMP, CPK, ESR and CRP  Please pull PIC at completion of IV antibiotics  Fax weekly lab results  promptly to [phone]  IV antibiotic discharge orders are pended. To discharging provider:  please sign these orders via discharge navigator,  Select New Orders & click on the button choice - Manage This Unsigned Work.     Thank you for allowing pharmacy to be a part of this patient's care.  DDoreene Eland PharmD, BCPS, BCIDP Work Cell: [phone]/29/2023 10:58 AM

## 2022-10-09 NOTE — TOC Progression Note (Signed)
Transition of Care [facility]) - Progression Note    Patient Details  Name: [name] MRN: [id number] Date of Birth: October 23, 1946  Transition of Care [facility]) CM/SW [place], RN Phone Number: 10/09/2022, 2:03 PM  Clinical Narrative:    [facility] notified me that they will have a private room available, I notified the patient's partner Saralyn Pilar   Expected Discharge Plan: [place] Barriers to Discharge: Continued Medical Work up  Expected Discharge Plan and Services Expected Discharge Plan: [place] arrangements for the past 2 months: Single Family Home                                       Social Determinants of Health (SDOH) Interventions    Readmission Risk Interventions     No data to display

## 2022-10-09 NOTE — Progress Notes (Signed)
PROGRESS NOTE    [name]  KGM:[id number] DOB: 10-20-1946 DOA: 10/02/2022 PCP: [name], MD  Brief Narrative:  This 76 years old male with PMH significant for type 1 diabetes on insulin pump, history of left leg DVT, coronary artery disease s/p stent angioplasty, GERD, history of bladder cancer, history of ureteral stricture s/p dilatation, prior history of MRSA infection presented in the ED for the evaluation of left knee pain  for last 6 days resulting in an inability to bear weight or bend his left knee. Patient had an MRI of left knee which shows moderate knee joint effusion with synovial thickening suggesting synovitis. He is also found to have complex tear of posterior horn of medial meniscus. Orthopedics was consulted and patient was taken to the OR for arthroscopy and washout of left knee.  Tolerated well. Blood cultures + for MRSA, underwent TEE negative for vegetation, needs 6 weeks of iv antibiotics, PICC line to be placed today.  Assessment & Plan:   Principal Problem:   Uncontrolled type 1 diabetes mellitus with hyperglycemia, with long-term current use of insulin ([name]) Active Problems:   Septic arthritis of knee, left ([name])   AKI (acute kidney injury) ([place])   DVT (deep venous thrombosis) ([name])   HYPERTENSION, BENIGN   CAD, NATIVE VESSEL   MRSA bacteremia   Cardiomyopathy ([place])   Coronary artery disease involving native coronary artery of native heart  Septic arthritis of left knee: MRSA Bacteremia: Patient presented for the evaluation of worsening left knee pain and swelling. He was unable to bear weight on left leg. MRI concerning for septic arthritis and meniscal tear. Orthopedics was consulted. He underwent extensive arthroscopic debridement with partial meniscectomy Empirically started on vancomycin and Rocephin. Blood cultures + for MRSA Infectious diseases consulted, antibiotics changed to daptomycin given worsening renal functions. TTE showed LVEF  35 to 40%.  No vegetations. Cardiology consulted for TEE and possible cardiology evaluation for reduced LVEF. TEE : No evidence of vegetations. He underwent repeat arthroscopic irrigation and debridement. Infectious disease recommended daptomycin for 6 weeks.  Last day would be  (November 15, 2022) PICC line ordered,  will be placed today.  Acute systolic heart failure: It could be acute or acute on chronic CHF likely ischemic cardiomyopathy. TTE shows LVEF 35 to 40% with reduced left ventricular function. He appears euvolemic on exam. TEE: No vegetations Cardiology consulted, started on Entresto.   Continue Coreg and escalate GDMT as tolerated. Start Lasix 40 mg IV daily,  needs oral Lasix on discharge. Plan for right and left heart cath after resolution of bacteremia.  Will be planned as an outpatient.  Uncontrolled type 1 diabetes mellitus with hyperglycemia: Most likely related to systemic steroid injection the patient has received for left knee pain. Hold insulin pump for now. Continue gentle fluid resuscitation. Continue Semglee 28 units daily with sliding scale coverage. Carb modified diet. Appreciate Diabetic coordinator consult.   Acute kidney injury : > Resolved. Baseline serum creatinine 1.0, presented in the ED with creatinine of 1.38 Unclear etiology of worsening renal functions but could be related to recent NSAID use. Renal functions improved with IV hydration.  Avoid nephrotoxic medications.  Left peroneal vein DVT: Patient noted to have nonocclusive thrombus seen within the left calf peroneal vein. Continue Eliquis.   Coronary artery disease, native vessel. Stable. Continue Coreg, Lipitor, and aspirin.   Essential hypertension Continue Coreg.   Hypophosphatemia: Replaced.  Continue to monitor.  DVT prophylaxis: Eliquis Code Status: Full code Family Communication:  Partner at bedside Disposition Plan:   Status is: Inpatient Remains inpatient appropriate  because: Patient admitted for left knee swelling,  found to have septic arthritis requiring IV antibiotics.   Orthopedics is consulted,  Patient underwent repair of the meniscus tear. Infectious disease recommended TEE  ruled out infective endocarditis.  Patient underwent repeat arthroscopic debridement.  Plan : anticipated discharge to SNF with PICC line to complete 6 weeks of IV daptomycin until November 15, 1922.   Consultants:  Orthopedics  Procedures: Arthroscopy with debridement with partial meniscectomy Antimicrobials: Anti-infectives (From admission, onward)    Start     Dose/Rate Route Frequency Ordered Stop   10/05/22 1200  DAPTOmycin (CUBICIN) 700 mg in sodium chloride 0.9 % IVPB        8 mg/kg  88.5 kg 128 mL/hr over 30 Minutes Intravenous Daily 10/04/22 1550     10/05/22 0800  vancomycin (VANCOREADY) IVPB 1250 mg/250 mL  Status:  Discontinued        1,250 mg 166.7 mL/hr over 90 Minutes Intravenous Every 24 hours 10/04/22 0840 10/04/22 1550   10/04/22 1600  vancomycin (VANCOREADY) IVPB 1250 mg/250 mL  Status:  Discontinued        1,250 mg 166.7 mL/hr over 90 Minutes Intravenous Every 24 hours 10/03/22 1359 10/04/22 0746   10/04/22 0800  vancomycin (VANCOREADY) IVPB 2000 mg/400 mL        2,000 mg 200 mL/hr over 120 Minutes Intravenous  Once 10/04/22 0747 10/04/22 1026   10/03/22 0815  vancomycin (VANCOREADY) IVPB 2000 mg/400 mL  Status:  Discontinued        2,000 mg 200 mL/hr over 120 Minutes Intravenous  Once 10/03/22 0811 10/04/22 0746   10/03/22 0800  cefTRIAXone (ROCEPHIN) 2 g in sodium chloride 0.9 % 100 mL IVPB  Status:  Discontinued        2 g 200 mL/hr over 30 Minutes Intravenous Every 24 hours 10/03/22 0754 10/04/22 0159       Subjective: Patient was seen and examined at bedside.Overnight events noted. Patient reported left knee pain has improved.   He was sitting comfortably on the chair with his legs elevated in the recliner.  Objective: Vitals:   10/08/22  0755 10/08/22 1748 10/08/22 2347 10/09/22 0749  BP: (!) 124/58 (!) 122/59 112/67 111/62  Pulse: 74 73 81 89  Resp: '17 16 20   '$ Temp: 97.6 F (36.4 C) (!) 97.5 F (36.4 C) 97.9 F (36.6 C) 98.8 F (37.1 C)  TempSrc:   Oral   SpO2: 99% 100% 96% 100%  Weight:      Height:        Intake/Output Summary (Last 24 hours) at 10/09/2022 1145 Last data filed at 10/09/2022 0813 Gross per 24 hour  Intake 1593.88 ml  Output 1050 ml  Net 543.88 ml   Filed Weights   10/02/22 2229 10/07/22 1448  Weight: 88.5 kg 88.5 kg    Examination:  General exam: Appears comfortable, not in any acute distress, deconditioned. Respiratory system: CTA bilaterally, respiratory effort normal, RR 13. Cardiovascular system: S1-S2 heard, regular rate and rhythm, no murmur. Gastrointestinal system: Abdomen is soft, non tender, non distended, BS+ Central nervous system: Alert and oriented x3, no focal neurological deficits. Extremities: Left knee tenderness noted, left knee covered in dressing. Skin: No rashes, lesions or ulcers Psychiatry: Judgement and insight appear normal. Mood & affect appropriate.     Data Reviewed: I have personally reviewed following labs and imaging studies  CBC: Recent Labs  Lab  10/03/22 6256 10/04/22 0459 10/05/22 0500 10/06/22 0528 10/08/22 0632 10/09/22 0822  WBC 20.5* 23.9* 21.7* 15.1* 16.1* 18.3*  NEUTROABS 17.1*  --   --   --   --   --   HGB 10.5* 11.3* 10.8* 11.4* 11.6* 12.4*  HCT 32.0* 33.8* 31.6* 34.2* 35.7* 37.7*  MCV 88.6 88.5 87.5 87.7 89.7 88.7  PLT 398 470* 468* 459* 462* 389*   Basic Metabolic Panel: Recent Labs  Lab 10/03/22 0658 10/04/22 0459 10/05/22 0500 10/06/22 0528 10/08/22 0632  NA 136 137 134* 140 141  K 4.6 5.3*  5.2* 4.8 4.2 4.6  CL 103 102 103 110 110  CO2 18* 19* 21* 23 20*  GLUCOSE 390* 515* 369* 274* 367*  BUN 59* 72* 76* 53* 34*  CREATININE 1.38* 1.52* 1.67* 1.12 1.06  CALCIUM 9.1 8.9 8.5* 8.5* 8.1*  MG  --   --  2.2 2.3 2.3   PHOS  --   --  3.0 1.8* 3.4   GFR: Estimated Creatinine Clearance: 66.3 mL/min (by C-G formula based on SCr of 1.06 mg/dL). Liver Function Tests: Recent Labs  Lab 10/03/22 0658 10/05/22 0500  AST 19 14*  ALT 18 13  ALKPHOS 100 100  BILITOT 1.9* 1.1  PROT 6.6 5.7*  ALBUMIN 2.7* 2.2*   No results for input(s): "LIPASE", "AMYLASE" in the last 168 hours. No results for input(s): "AMMONIA" in the last 168 hours. Coagulation Profile: No results for input(s): "INR", "PROTIME" in the last 168 hours. Cardiac Enzymes: Recent Labs  Lab 10/04/22 0900 10/05/22 0500  CKTOTAL 82 49   BNP (last 3 results) No results for input(s): "PROBNP" in the last 8760 hours. HbA1C: No results for input(s): "HGBA1C" in the last 72 hours.  CBG: Recent Labs  Lab 10/08/22 0816 10/08/22 1131 10/08/22 1645 10/08/22 2142 10/09/22 0753  GLUCAP 342* 341* 245* 248* 352*   Lipid Profile: No results for input(s): "CHOL", "HDL", "LDLCALC", "TRIG", "CHOLHDL", "LDLDIRECT" in the last 72 hours. Thyroid Function Tests: No results for input(s): "TSH", "T4TOTAL", "FREET4", "T3FREE", "THYROIDAB" in the last 72 hours. Anemia Panel: No results for input(s): "VITAMINB12", "FOLATE", "FERRITIN", "TIBC", "IRON", "RETICCTPCT" in the last 72 hours. Sepsis Labs: Recent Labs  Lab 10/03/22 0906 10/03/22 1500  LATICACIDVEN 1.9 1.6    Recent Results (from the past 240 hour(s))  Blood Culture (routine x 2)     Status: Abnormal   Collection Time: 10/03/22  9:06 AM   Specimen: BLOOD  Result Value Ref Range Status   Specimen Description   Final    BLOOD RIGHT [facility] Performed at [facility], [address], [place], [place] [address]    Special Requests   Final    BOTTLES DRAWN AEROBIC AND ANAEROBIC Blood Culture adequate volume Performed at [facility], [address], [place], [place] [address]    Culture  Setup Time   Final    GRAM POSITIVE COCCI IN BOTH AEROBIC AND ANAEROBIC  BOTTLES CRITICAL RESULT CALLED TO, READ BACK BY AND VERIFIED WITH: Violeta Gelinas PHARMD AT 1572 10/04/2022 GAA    Culture METHICILLIN RESISTANT STAPHYLOCOCCUS AUREUS (A)  Final   Report Status 10/06/2022 FINAL  Final   Organism ID, Bacteria METHICILLIN RESISTANT STAPHYLOCOCCUS AUREUS  Final      Susceptibility   Methicillin resistant staphylococcus aureus - MIC*    CIPROFLOXACIN >=8 RESISTANT Resistant     ERYTHROMYCIN >=8 RESISTANT Resistant     GENTAMICIN <=0.5 SENSITIVE Sensitive     OXACILLIN >=4 RESISTANT  Resistant     TETRACYCLINE <=1 SENSITIVE Sensitive     VANCOMYCIN 1 SENSITIVE Sensitive     TRIMETH/SULFA <=10 SENSITIVE Sensitive     CLINDAMYCIN <=0.25 SENSITIVE Sensitive     RIFAMPIN <=0.5 SENSITIVE Sensitive     Inducible Clindamycin NEGATIVE Sensitive     * METHICILLIN RESISTANT STAPHYLOCOCCUS AUREUS  Blood Culture (routine x 2)     Status: Abnormal   Collection Time: 10/03/22  9:06 AM   Specimen: BLOOD  Result Value Ref Range Status   Specimen Description   Final    BLOOD RIGTH [facility] Performed at [facility], [address], [place], [place] [address]    Special Requests   Final    BOTTLES DRAWN AEROBIC AND ANAEROBIC Blood Culture adequate volume Performed at [facility], [address] [place], [place] [address]    Culture  Setup Time   Final    GRAM POSITIVE COCCI IN BOTH AEROBIC AND ANAEROBIC BOTTLES GRAM STAIN REVIEWED-AGREE WITH RESULT CRITICAL VALUE NOTED.  VALUE IS CONSISTENT WITH PREVIOUSLY REPORTED AND CALLED VALUE. Performed at [facility], [address] [place], [place] [address]    Culture (A)  Final    STAPHYLOCOCCUS AUREUS SUSCEPTIBILITIES PERFORMED ON PREVIOUS CULTURE WITHIN THE LAST 5 DAYS. Performed at [facility], [place] [address] [place], [place] [address]    Report Status 10/06/2022 FINAL  Final  Blood Culture ID Panel (Reflexed)     Status: Abnormal   Collection Time: 10/03/22  9:06 AM  Result Value  Ref Range Status   Enterococcus faecalis NOT DETECTED NOT DETECTED Final   Enterococcus Faecium NOT DETECTED NOT DETECTED Final   Listeria monocytogenes NOT DETECTED NOT DETECTED Final   Staphylococcus species DETECTED (A) NOT DETECTED Final    Comment: CRITICAL RESULT CALLED TO, READ BACK BY AND VERIFIED WITH: Violeta Gelinas PHARMD AT 0150 10/04/2022 GAA    Staphylococcus aureus (BCID) DETECTED (A) NOT DETECTED Final    Comment: Methicillin (oxacillin)-resistant Staphylococcus aureus (MRSA). MRSA is predictably resistant to beta-lactam antibiotics (except ceftaroline). Preferred therapy is vancomycin unless clinically contraindicated. Patient requires contact precautions if  hospitalized. CRITICAL RESULT CALLED TO, READ BACK BY AND VERIFIED WITH: Violeta Gelinas PHARMD AT 2831 10/04/2022 GAA    Staphylococcus epidermidis NOT DETECTED NOT DETECTED Final   Staphylococcus lugdunensis NOT DETECTED NOT DETECTED Final   Streptococcus species NOT DETECTED NOT DETECTED Final   Streptococcus agalactiae NOT DETECTED NOT DETECTED Final   Streptococcus pneumoniae NOT DETECTED NOT DETECTED Final   Streptococcus pyogenes NOT DETECTED NOT DETECTED Final   A.calcoaceticus-baumannii NOT DETECTED NOT DETECTED Final   Bacteroides fragilis NOT DETECTED NOT DETECTED Final   Enterobacterales NOT DETECTED NOT DETECTED Final   Enterobacter cloacae complex NOT DETECTED NOT DETECTED Final   Escherichia coli NOT DETECTED NOT DETECTED Final   Klebsiella aerogenes NOT DETECTED NOT DETECTED Final   Klebsiella oxytoca NOT DETECTED NOT DETECTED Final   Klebsiella pneumoniae NOT DETECTED NOT DETECTED Final   Proteus species NOT DETECTED NOT DETECTED Final   Salmonella species NOT DETECTED NOT DETECTED Final   Serratia marcescens NOT DETECTED NOT DETECTED Final   Haemophilus influenzae NOT DETECTED NOT DETECTED Final   Neisseria meningitidis NOT DETECTED NOT DETECTED Final   Pseudomonas aeruginosa NOT DETECTED NOT  DETECTED Final   Stenotrophomonas maltophilia NOT DETECTED NOT DETECTED Final   Candida albicans NOT DETECTED NOT DETECTED Final   Candida auris NOT DETECTED NOT DETECTED Final   Candida glabrata NOT DETECTED NOT  DETECTED Final   Candida krusei NOT DETECTED NOT DETECTED Final   Candida parapsilosis NOT DETECTED NOT DETECTED Final   Candida tropicalis NOT DETECTED NOT DETECTED Final   Cryptococcus neoformans/gattii NOT DETECTED NOT DETECTED Final   Meth resistant mecA/C and MREJ DETECTED (A) NOT DETECTED Final    Comment: CRITICAL RESULT CALLED TO, READ BACK BY AND VERIFIED WITHVioleta Gelinas PHARMD AT 8828 10/04/2022 GAA Performed at [facility], [address] [place], [place] [address]   Aerobic/Anaerobic Culture w Gram Stain (surgical/deep wound)     Status: None   Collection Time: 10/03/22 11:45 AM   Specimen: PATH Cytology Misc. fluid; Body Fluid  Result Value Ref Range Status   Specimen Description   Final    KNEE LEFT Performed at [facility], [address] [place], [place] [address]    Special Requests   Final    NONE Performed at [facility], [address] [place], [place] [address]    Gram Stain   Final    ABUNDANT WBC PRESENT, PREDOMINANTLY PMN MODERATE GRAM POSITIVE COCCI IN PAIRS IN CLUSTERS Gram Stain Report Called to,Read Back By and Verified With:  Doretha Sou MCWHITE, RN 10/04/22 0100 A. LAFR[place]    Culture   Final    ABUNDANT METHICILLIN RESISTANT STAPHYLOCOCCUS AUREUS NO ANAEROBES ISOLATED Performed at [facility], [place] [address], [place], [place] [address]    Report Status 10/08/2022 FINAL  Final   Organism ID, Bacteria METHICILLIN RESISTANT STAPHYLOCOCCUS AUREUS  Final      Susceptibility   Methicillin resistant staphylococcus aureus - MIC*    CIPROFLOXACIN >=8 RESISTANT Resistant     ERYTHROMYCIN >=8 RESISTANT Resistant     GENTAMICIN <=0.5 SENSITIVE Sensitive     OXACILLIN >=4 RESISTANT Resistant      TETRACYCLINE <=1 SENSITIVE Sensitive     VANCOMYCIN 1 SENSITIVE Sensitive     TRIMETH/SULFA <=10 SENSITIVE Sensitive     CLINDAMYCIN <=0.25 SENSITIVE Sensitive     RIFAMPIN <=0.5 SENSITIVE Sensitive     Inducible Clindamycin NEGATIVE Sensitive     * ABUNDANT METHICILLIN RESISTANT STAPHYLOCOCCUS AUREUS  Culture, blood (Routine X 2) w Reflex to ID Panel     Status: None (Preliminary result)   Collection Time: 10/05/22  4:59 AM   Specimen: BLOOD RIGHT WRIST  Result Value Ref Range Status   Specimen Description BLOOD RIGHT WRIST  Final   Special Requests Blood Culture adequate volume  Final   Culture   Final    NO GROWTH 4 DAYS Performed at [facility], [address], [place], [place] [address]    Report Status PENDING  Incomplete  Culture, blood (Routine X 2) w Reflex to ID Panel     Status: None (Preliminary result)   Collection Time: 10/05/22  7:04 AM   Specimen: BLOOD LEFT HAND  Result Value Ref Range Status   Specimen Description BLOOD LEFT HAND  Final   Special Requests Blood Culture adequate volume  Final   Culture   Final    NO GROWTH 4 DAYS Performed at [facility], [address] [place], [place] [address]    Report Status PENDING  Incomplete  Aerobic/Anaerobic Culture w Gram Stain (surgical/deep wound)     Status: None (Preliminary result)   Collection Time: 10/07/22  4:22 PM   Specimen: PATH Cytology Misc. fluid; Body Fluid  Result Value Ref Range Status   Specimen Description   Final    WOUND Performed at  [facility], [address], [place], [place] [address]    Special Requests   Final    LEFT KNEE Performed at [facility], [address] [place], [place] [address]    Gram Stain   Final    MODERATE WBC PRESENT, PREDOMINANTLY PMN FEW GRAM POSITIVE COCCI IN PAIRS Performed at [facility], [place] [address] [place],  [address]    Culture ABUNDANT STAPHYLOCOCCUS AUREUS  Final   Report Status PENDING   Incomplete    Radiology Studies: [place] EKG SITE RITE  Result Date: 10/09/2022 If Site Rite image not attached, placement could not be confirmed due to current cardiac rhythm.  ECHO TEE  Result Date: 10/07/2022    TRANSESOPHOGEAL ECHO REPORT   Patient Name:   [name] Diebold Date of Exam: 10/07/2022 Medical Rec #:  [id number]            Height:       69.0 in Accession #:    [id number]           Weight:       195.0 lb Date of Birth:  1946-09-03            BSA:          2.044 m Patient Age:    67 years             BP:           140/64 mmHg Patient Gender: M                    HR:           79 bpm. Exam Location:  [facility] Procedure: Transesophageal Echo, Color Doppler and Cardiac Doppler Indications:     Bacteremia R78.81  History:         Patient has prior history of Echocardiogram examinations, most                  recent 10/05/2022. Ischemic cardiomyopathy, CAD; Risk                  Factors:Hypertension and Dyslipidemia.  Sonographer:     Sherrie Sport Referring Phys:  [id number] Kate Sable Diagnosing Phys: Kate Sable MD PROCEDURE: The transesophogeal probe was passed without difficulty through the esophogus of the patient. Sedation performed by performing physician. The patient developed no complications during the procedure.  IMPRESSIONS  1. Left ventricular ejection fraction, by estimation, is 35 to 40%. The left ventricle has moderately decreased function.  2. Right ventricular systolic function is normal. The right ventricular size is normal.  3. No left atrial/left atrial appendage thrombus was detected.  4. The mitral valve is normal in structure. No evidence of mitral valve regurgitation.  5. The aortic valve is tricuspid. Aortic valve regurgitation is not visualized. Conclusion(s)/Recommendation(s): No evidence of vegetation/infective endocarditis on this transesophageael echocardiogram. FINDINGS  Left Ventricle: Left ventricular ejection fraction, by estimation, is 35 to 40%. The left  ventricle has moderately decreased function. The left ventricular internal cavity size was normal in size. Right Ventricle: The right ventricular size is normal. No increase in right ventricular wall thickness. Right ventricular systolic function is normal. Left Atrium: Left atrial size was normal in size. No left atrial/left atrial appendage thrombus was detected. Right Atrium: Right atrial size was normal in size. Prominent Chiari network. Pericardium: There is no evidence of pericardial effusion. Mitral Valve: The mitral valve is normal in structure. No evidence of mitral valve  regurgitation. Tricuspid Valve: The tricuspid valve is normal in structure. Tricuspid valve regurgitation is trivial. Aortic Valve: The aortic valve is tricuspid. Aortic valve regurgitation is not visualized. Pulmonic Valve: The pulmonic valve was normal in structure. Pulmonic valve regurgitation is not visualized. Aorta: The aortic root is normal in size and structure. IAS/Shunts: No atrial level shunt detected by color flow Doppler. Kate Sable MD Electronically signed by Kate Sable MD Signature Date/Time: 10/07/2022/3:51:18 PM    Final     Scheduled Meds:  apixaban  10 mg Oral BID   Followed by   Derrill Memo ON 10/14/2022] apixaban  5 mg Oral BID   atorvastatin  40 mg Oral Daily   bisacodyl  5 mg Oral BID   carvedilol  6.25 mg Oral BID WC   cyanocobalamin  1,000 mcg Oral QODAY   docusate sodium  200 mg Oral BID   famotidine  20 mg Oral BID   furosemide  40 mg Intravenous Daily   insulin aspart  0-20 Units Subcutaneous TID WC   insulin aspart  6 Units Subcutaneous TID WC   insulin glargine-yfgn  28 Units Subcutaneous Daily   multivitamin with minerals  1 tablet Oral Daily   sacubitril-valsartan  1 tablet Oral BID   senna  1 tablet Oral BID   Continuous Infusions:  DAPTOmycin (CUBICIN) 700 mg in sodium chloride 0.9 % IVPB 700 mg (10/08/22 2358)     LOS: 6 days     Time spent: 35 mins    Allanna Bresee,  MD [facility]   If 7PM-7AM, please contact night-coverage

## 2022-10-09 NOTE — TOC Progression Note (Addendum)
Transition of Care [facility]) - Progression Note    Patient Details  Name: [name] MRN: [id number] Date of Birth: 1946-04-20  Transition of Care [facility]) CM/SW [place], RN Phone Number: 10/09/2022, 10:41 AM  Clinical Narrative:   Met with the patient to review the bed offers He requested a STR with a private room  [facility] I am waiting to hear back from the others to see if they have a private room The patient will continue IV ABX until 11/15/22 Daptomycin Q 24 hours PICC Line to be placed today He stated that due to pain and mobility he would need to go in EMS for transport  He asked me to call Saralyn Pilar at [phone] I called Saralyn Pilar and we reviewed the bed offers,  [facility] I explained the IV ABX and needs there He would like to tour the facilities before deciding He will call me back and let me know their choice     Expected Discharge Plan: [place] Barriers to Discharge: Continued Medical Work up  Expected Discharge Plan and Services Expected Discharge Plan: [place] arrangements for the past 2 months: Single Family Home                                       Social Determinants of Health (SDOH) Interventions    Readmission Risk Interventions     No data to display

## 2022-10-09 NOTE — TOC Progression Note (Signed)
Transition of Care [facility]) - Progression Note    Patient Details  Name: [name] MRN: [id number] Date of Birth: [birth year]  Transition of Care [facility]) CM/SW [place], RN Phone Number: 10/09/2022, 4:22 PM  Clinical Narrative:     Saralyn Pilar notified me that they [facility], He will need Ins approval, Ins pending I made Tam[facility] at [facility] aware  Expected Discharge Plan: [place] Barriers to Discharge: Continued Medical Work up  Expected Discharge Plan and Services Expected Discharge Plan: [place] arrangements for the past 2 months: Single Family Home                                       Social Determinants of Health (SDOH) Interventions    Readmission Risk Interventions     No data to display

## 2022-10-09 NOTE — Progress Notes (Signed)
Peripherally Inserted Central Catheter Placement  The IV Nurse has discussed with the patient and/or persons authorized to consent for the patient, the purpose of this procedure and the potential benefits and risks involved with this procedure.  The benefits include less needle sticks, lab draws from the catheter, and the patient may be discharged home with the catheter. Risks include, but not limited to, infection, bleeding, blood clot (thrombus formation), and puncture of an artery; nerve damage and irregular heartbeat and possibility to perform a PICC exchange if needed/ordered by physician.  Alternatives to this procedure were also discussed.  Bard Power PICC patient education guide, fact sheet on infection prevention and patient information card has been provided to patient /or left at bedside.    PICC Placement Documentation  PICC Single Lumen 95/28/41 Right Basilic 40 cm 0 cm (Active)  Indication for Insertion or Continuance of Line Prolonged intravenous therapies 10/09/22 1430  Exposed Catheter (cm) 0 cm 10/09/22 1430  Site Assessment Clean, Dry, Intact 10/09/22 1430  Line Status Flushed;Blood return noted;Saline locked 10/09/22 1430  Dressing Type Transparent 10/09/22 1430  Dressing Status Antimicrobial disc in place 10/09/22 1430  Dressing Change Due 10/16/22 10/09/22 1430       [name] 10/09/2022, 3:11 PM

## 2022-10-09 NOTE — Plan of Care (Signed)
  Problem: Clinical Measurements: Goal: Diagnostic test results will improve Outcome: Progressing   Problem: Activity: Goal: Risk for activity intolerance will decrease Outcome: Progressing   Problem: Coping: Goal: Level of anxiety will decrease Outcome: Progressing   Problem: Pain Managment: Goal: General experience of comfort will improve Outcome: Progressing   Problem: Safety: Goal: Ability to remain free from injury will improve Outcome: Progressing   Problem: Skin Integrity: Goal: Risk for impaired skin integrity will decrease Outcome: Progressing   Problem: Skin Integrity: Goal: Risk for impaired skin integrity will decrease Outcome: Progressing   Problem: Nutritional: Goal: Progress toward achieving an optimal weight will improve Outcome: Progressing
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
## 2022-10-09 NOTE — Inpatient Diabetes Management (Addendum)
Inpatient Diabetes Program Recommendations  AACE/ADA: New Consensus Statement on Inpatient Glycemic Control (2015)  Target Ranges:  Prepandial:   less than 140 mg/dL      Peak postprandial:   less than 180 mg/dL (1-2 hours)      Critically ill patients:  140 - 180 mg/dL    Latest Reference Range & Units 10/07/22 07:46 10/07/22 12:01 10/07/22 12:29 10/07/22 14:58 10/07/22 17:02 10/07/22 17:33 10/07/22 23:01  Glucose-Capillary 70 - 99 mg/dL 202 (H)  Refused Novolog and Refused Semglee Insulins 231 (H)  Off floor for TEE 219 (H) 252 (H) 228 (H) 287 (H)  Refused Novolog 297 (H)  (H): Data is abnormally high  Latest Reference Range & Units 10/08/22 08:16 10/08/22 11:31 10/08/22 16:45 10/08/22 21:42  Glucose-Capillary 70 - 99 mg/dL 342 (H)  19 units Novolog   Refused Semglee Insulin 341 (H)  21 units Novolog  245 (H)  11 units Novolog  248 (H)  (H): Data is abnormally high  Latest Reference Range & Units 10/09/22 07:53  Glucose-Capillary 70 - 99 mg/dL 352 (H)  26 units Novolog  28 units Semglee  (H): Data is abnormally high   History: Type 1 diabetes  Home DM Meds: Insulin Pump with the following Settings: ENDO: Dr. Gabriel Carina with [facility] Last seen 09/24/2022 Tandem X2 insulin pump and DexCom G6 continuous glucose monitor (started in 09/2018) Basal Correction Carb ratio Target BG 12 AM 0.85 unit/hr 40 1:20 120 4 AM 0.75 unit/hr 40 1:13 120 7:30 AM 1.1 unit/hr 40 1:13 120 4 PM 1.2 unit/hr 40 1:18 120  24-hr basal = 25 units Duration of insulin = 5 hrs   Current Orders: Semglee 28 units Daily      Novolog Resistant Correction Scale/ SSI (0-20 units) TID AC      Novolog 6 units TID with meals    MD- Please note that it appears pt has NOT received any basal insulin (Semglee) for the past 2 days!!  Per MAR, pt refused the Semglee Insulin both days--This is likely why CBGs have been so elevated  Pt did agree to take Semglee Insulin this AM  Please consider:  1.  Check BMET this AM to check Anion Gap and CO2 level  2. Increase Novolog Meal Coverage to 8 units TID with meals    --Will follow patient during hospitalization--  Wyn Quaker RN, MSN, [place] Diabetes Coordinator Inpatient Glycemic Control Team Team Pager: [phone] (8a-5p)

## 2022-10-09 NOTE — Care Management Important Message (Signed)
Important Message  Patient Details  Name: [name] MRN: [id number] Date of Birth: 1946/04/23   Medicare Important Message Given:  Other (see comment)  Patient is in an isolation room so I called to review his Important Message from Medicare by phone [phone]) but there was no answer. Will try again tomorrow.    [name] 10/09/2022, 3:45 PM

## 2022-10-10 ENCOUNTER — Inpatient Hospital Stay: Payer: Medicare HMO

## 2022-10-10 DIAGNOSIS — I82402 Acute embolism and thrombosis of unspecified deep veins of left lower extremity: Secondary | ICD-10-CM

## 2022-10-10 DIAGNOSIS — I25118 Atherosclerotic heart disease of native coronary artery with other forms of angina pectoris: Secondary | ICD-10-CM

## 2022-10-10 DIAGNOSIS — I420 Dilated cardiomyopathy: Secondary | ICD-10-CM

## 2022-10-10 DIAGNOSIS — R7881 Bacteremia: Secondary | ICD-10-CM | POA: Diagnosis not present

## 2022-10-10 DIAGNOSIS — I82452 Acute embolism and thrombosis of left peroneal vein: Secondary | ICD-10-CM

## 2022-10-10 DIAGNOSIS — E1065 Type 1 diabetes mellitus with hyperglycemia: Secondary | ICD-10-CM | POA: Diagnosis not present

## 2022-10-10 DIAGNOSIS — M00062 Staphylococcal arthritis, left knee: Secondary | ICD-10-CM | POA: Diagnosis not present

## 2022-10-10 DIAGNOSIS — I951 Orthostatic hypotension: Secondary | ICD-10-CM | POA: Insufficient documentation

## 2022-10-10 DIAGNOSIS — D75838 Other thrombocytosis: Secondary | ICD-10-CM | POA: Insufficient documentation

## 2022-10-10 LAB — CULTURE, BLOOD (ROUTINE X 2)
Culture: NO GROWTH
Culture: NO GROWTH
Special Requests: ADEQUATE
Special Requests: ADEQUATE

## 2022-10-10 LAB — BASIC METABOLIC PANEL
Anion gap: 4 — ABNORMAL LOW (ref 5–15)
BUN: 48 mg/dL — ABNORMAL HIGH (ref 8–23)
CO2: 27 mmol/L (ref 22–32)
Calcium: 8.1 mg/dL — ABNORMAL LOW (ref 8.9–10.3)
Chloride: 111 mmol/L (ref 98–111)
Creatinine, Ser: 1.02 mg/dL (ref 0.61–1.24)
GFR, Estimated: >60 mL/min (ref >60)
Glucose, Bld: 207 mg/dL — ABNORMAL HIGH (ref 70–99)
Potassium: 3.9 mmol/L (ref 3.5–5.1)
Sodium: 142 mmol/L (ref 135–145)

## 2022-10-10 LAB — CBC
HCT: 31.1 % — ABNORMAL LOW (ref 39.0–52.0)
Hemoglobin: 10.8 g/dL — ABNORMAL LOW (ref 13.0–17.0)
MCH: 29.8 pg (ref 26.0–34.0)
MCHC: 34.7 g/dL (ref 30.0–36.0)
MCV: 85.7 fL (ref 80.0–100.0)
Platelets: 521 10*3/uL — ABNORMAL HIGH (ref 150–400)
RBC: 3.63 MIL/uL — ABNORMAL LOW (ref 4.22–5.81)
RDW: 14.6 % (ref 11.5–15.5)
WBC: 19.3 10*3/uL — ABNORMAL HIGH (ref 4.0–10.5)
nRBC: 0 % (ref 0.0–0.2)

## 2022-10-10 LAB — GLUCOSE, CAPILLARY
Glucose-Capillary: 189 mg/dL — ABNORMAL HIGH (ref 70–99)
Glucose-Capillary: 247 mg/dL — ABNORMAL HIGH (ref 70–99)
Glucose-Capillary: 209 mg/dL — ABNORMAL HIGH (ref 70–99)
Glucose-Capillary: 202 mg/dL — ABNORMAL HIGH (ref 70–99)

## 2022-10-10 LAB — MAGNESIUM: Magnesium: 2.3 mg/dL (ref 1.7–2.4)

## 2022-10-10 LAB — PHOSPHORUS: Phosphorus: 3 mg/dL (ref 2.5–4.6)

## 2022-10-10 LAB — CK: Total CK: 32 U/L — ABNORMAL LOW (ref 49–397)

## 2022-10-10 MED ORDER — GADOBUTROL 1 MMOL/ML IV SOLN
10.0000 mL | Freq: Once | INTRAVENOUS | Status: AC | PRN
  Administered 2022-10-10: 10 mL via INTRAVENOUS

## 2022-10-10 MED ORDER — LACTULOSE 10 GM/15ML PO SOLN
20.0000 g | Freq: Once | ORAL | Status: AC
  Administered 2022-10-10: 20 g via ORAL
  Filled 2022-10-10: qty 30

## 2022-10-10 NOTE — Progress Notes (Signed)
Physical Therapy Treatment Patient Details Name: [name] MRN: [id number] DOB: 1946-10-08 Today's Date: 10/10/2022   History of Present Illness Patient is a 76 year old male presented to ER and admitted secondary to progressive L knee pain, s/p arthroscopic debridement, partial menisectomy (10/03/22); hospital stay complicated by uncontrolled DM. Nonocclusive left peroneal DVT with history of left lower extremity DVT. Patient underwent Arthroscopic irrigation and debridement, left knee for septic arthritis on 10/07/22    PT Comments    Pt was sitting in recliner with supportive partner at bedside. He is alert and conversational however still not at baseline cognition. He agrees to PT session. Endorses more sacral pain than knee pain. No wounds, rash, redness were observed. Once standing, pain resolves. Endorses 6/10 knee pain when asked however did not demonstrate signs of sever pain. Pt was agreeable to ambulation with RW. He tolerated gait with 3 point gait pattern prior to having change in alertness. Made it to doorway of room prior to near syncopal episode. BP 78/50. Pushed pt in chair to EOB and stood pivot pt to bed. BP elevated to 114/60 with alertness quickly improving. RN was in room during episode. Mds made aware. Will continue to closely monitor going forward. SNF still most appropriate DC disposition.    Recommendations for follow up therapy are one component of a multi-disciplinary discharge planning process, led by the attending physician.  Recommendations may be updated based on patient status, additional functional criteria and insurance authorization.  Follow Up Recommendations  Skilled nursing-short term rehab (<3 hours/day)     Assistance Recommended at Discharge Frequent or constant Supervision/Assistance  Patient can return home with the following A little help with walking and/or transfers;Assistance with cooking/housework;Assist for transportation;Help with  stairs or ramp for entrance;A little help with bathing/dressing/bathroom;Direct supervision/assist for medications management   Equipment Recommendations  Other (comment) (defer to next level of care)       Precautions / Restrictions Precautions Precautions: Fall Restrictions Weight Bearing Restrictions: Yes LLE Weight Bearing: Weight bearing as tolerated     Mobility  Bed Mobility Overal bed mobility: Needs Assistance Bed Mobility: Sit to Supine  Sit to supine: Mod assist    Transfers Overall transfer level: Needs assistance Equipment used: Rolling walker (2 wheels) Transfers: Sit to/from Stand Sit to Stand: Min assist     Ambulation/Gait Ambulation/Gait assistance: Min assist Gait Distance (Feet): 25 Feet Assistive device: Rolling walker (2 wheels) Gait Pattern/deviations: Step-to pattern, Antalgic Gait velocity: decreased     General Gait Details: pt ambulated to doorway of room but becomes less alert and endorsing severe fatigue. quickly had pt sit. near syncopal. BP once sitting 70s/50s. quickly returned to bed with elevation of feet with BP and alertness resolving to 114/60. RN in room during episode. MDs made aware    Balance Overall balance assessment: Needs assistance Sitting-balance support: No upper extremity supported, Feet supported Sitting balance-Leahy Scale: Good     Standing balance support: Bilateral upper extremity supported, Reliant on assistive device for balance, During functional activity Standing balance-Leahy Scale: Fair       Cognition Arousal/Alertness: Awake/alert Behavior During Therapy: WFL for tasks assessed/performed Overall Cognitive Status: Within Functional Limits for tasks assessed Area of Impairment: Attention, Following commands, Safety/judgement, Awareness, Problem solving    Current Attention Level: Focused   Following Commands: Follows one step commands consistently, Follows one step commands with increased  time Safety/Judgement: Decreased awareness of safety, Decreased awareness of deficits     General Comments: pt is alert  at beginning of session however after near syncopal episode was somewhat more lethargic.           General Comments General comments (skin integrity, edema, etc.): pt with notable edema/fluid on all extremities. does not endorse severe pain but when asked, endorses 6/10 knee pain. had not had pain meds since > 4hours prior.      Pertinent Vitals/Pain Pain Assessment Pain Assessment: 0-10 Pain Score: 6  Pain Location: L knee - with movement Pain Descriptors / Indicators: Discomfort, Sore Pain Intervention(s): Limited activity within patient's tolerance, Monitored during session, Premedicated before session, Repositioned     PT Goals (current goals can now be found in the care plan section) Acute Rehab PT Goals Patient Stated Goal: get better Progress towards PT goals: Progressing toward goals    Frequency    7X/week      PT Plan Current plan remains appropriate       AM-PAC PT "6 Clicks" Mobility   Outcome Measure  Help needed turning from your back to your side while in a flat bed without using bedrails?: A Little Help needed moving from lying on your back to sitting on the side of a flat bed without using bedrails?: A Little Help needed moving to and from a bed to a chair (including a wheelchair)?: A Little Help needed standing up from a chair using your arms (e.g., wheelchair or bedside chair)?: A Little Help needed to walk in hospital room?: A Little Help needed climbing 3-5 steps with a railing? : A Lot 6 Click Score: 17    End of Session   Activity Tolerance: Other (comment) (limited by near syncopal episode) Patient left: in bed;with call bell/phone within reach;with bed alarm set;with nursing/sitter in room;with family/visitor present Nurse Communication: Mobility status PT Visit Diagnosis: Muscle weakness (generalized)  (M62.81);Difficulty in walking, not elsewhere classified (R26.2);Pain Pain - Right/Left: Left Pain - part of body: Knee     Time: 0750-0829 PT Time Calculation (min) (ACUTE ONLY): 39 min  Charges:  $Gait Training: 8-22 mins $Therapeutic Activity: 23-37 mins                     Julaine Fusi PTA 10/10/22, 9:12 AM

## 2022-10-10 NOTE — Care Management Important Message (Signed)
Important Message  Patient Details  Name: [name] MRN: [id number] Date of Birth: 03/08/1946   Medicare Important Message Given:  Yes     Juliann Pulse A Joshua Soulier 10/10/2022, 2:35 PM

## 2022-10-10 NOTE — Plan of Care (Signed)

## 2022-10-10 NOTE — TOC Progression Note (Signed)
Transition of Care [facility] - Progression Note    Patient Details  Name: [name] MRN: [id number] Date of Birth: December 11, 1945  [facility]) CM/SW [place], RN Phone Number: 10/10/2022, 3:12 PM  Clinical Narrative:    Spoke with Saralyn Pilar, I explained that [facility] offered the bed, they accepted and I explained that after his procedure we will start ins process tog et approval   Expected Discharge Plan: [place] Barriers to Discharge: Continued Medical Work up  Expected Discharge Plan and Services Expected Discharge Plan: [place] arrangements for the past 2 months: Single Family Home                                       Social Determinants of Health (SDOH) Interventions    Readmission Risk Interventions     No data to display

## 2022-10-10 NOTE — Progress Notes (Addendum)
0752 Per Dr [name] pt is to be NPO at midnight for possible wash out of L knee tomorrow 12/1.  [phone] Donut pillow given to pt to give bottom a rest. No open areas noted. Slight redness.  0824 Notified pt became lightheaded with PT bp 78/50. Pt returned to bed bp 114/60. Will inform Dr [name]  1026 Pt transported to MRI at this time   1327 Dr Roosevelt Locks and Dr [name] aware that states he is having some L lower abd pain radiating to flank area. Pt has hx of UTI. Dr Roosevelt Locks states pain is possibly due to constipation. PO lactulose was given, once pt has BM if pain is not relieved possible urinalysis to be done. Pt and spouse aware of plan    1737 Dr Roosevelt Locks aware still no BM yet. Order for tap water enema. Pt refuses right now would like to rest some first.  1950 Taps water enema completed. Pt could only tolerate 525m of fluid. small pieces of hard stool passed. Pt would like to sit on bedpan for awhile, feels he will have a BM

## 2022-10-10 NOTE — TOC Progression Note (Signed)
Transition of Care [facility]) - Progression Note    Patient Details  Name: [name] MRN: [id number] Date of Birth: 23-Dec-1945  Transition of Care [facility]) CM/SW [place], RN Phone Number: 10/10/2022, 10:13 AM  Clinical Narrative:    Washout is planned for possibly Friday, Will hold off on obtaining Insurance approval until after.  TOC continues to follow for needs   Expected Discharge Plan: [place] Barriers to Discharge: Continued Medical Work up  Expected Discharge Plan and Services Expected Discharge Plan: [place] arrangements for the past 2 months: Single Family Home                                       Social Determinants of Health (SDOH) Interventions    Readmission Risk Interventions     No data to display

## 2022-10-10 NOTE — Plan of Care (Signed)

## 2022-10-10 NOTE — Progress Notes (Signed)
Rounding Note    Patient Name: [name] Date of Encounter: 10/10/2022  [place] Cardiologist: Ida Rogue, MD   Subjective   Denies significant shortness of breath or chest pain Reports his backside hurts from sitting in chair for prolonged period of time Felt best sitting on a doughnut or laying flat in bed Discussed recent arthroscopy and washout of left knee PICC line placed for long-term antibiotics 6 weeks Forward to going home  Inpatient Medications    Scheduled Meds:  apixaban  10 mg Oral BID   Followed by   Derrill Memo ON 10/14/2022] apixaban  5 mg Oral BID   atorvastatin  40 mg Oral Daily   bisacodyl  5 mg Oral BID   carvedilol  6.25 mg Oral BID WC   Chlorhexidine Gluconate Cloth  6 each Topical Daily   cyanocobalamin  1,000 mcg Oral QODAY   docusate sodium  200 mg Oral BID   famotidine  20 mg Oral BID   furosemide  40 mg Intravenous Daily   insulin aspart  0-20 Units Subcutaneous TID WC   insulin aspart  6 Units Subcutaneous TID WC   insulin glargine-yfgn  28 Units Subcutaneous Daily   multivitamin with minerals  1 tablet Oral Daily   sacubitril-valsartan  1 tablet Oral BID   senna  1 tablet Oral BID   sodium chloride flush  10-40 mL Intracatheter Q12H   Continuous Infusions:  DAPTOmycin (CUBICIN) 700 mg in sodium chloride 0.9 % IVPB 700 mg (10/09/22 2309)   PRN Meds: acetaminophen, bisacodyl, diphenhydrAMINE, hyoscyamine, magnesium hydroxide, metoCLOPramide **OR** metoCLOPramide (REGLAN) injection, morphine injection, ondansetron **OR** ondansetron (ZOFRAN) IV, mouth rinse, oxyCODONE, phenol, sodium chloride flush, sodium phosphate   Vital Signs    Vitals:   10/09/22 2205 10/10/22 0114 10/10/22 0820 10/10/22 1544  BP: (!) 110/52 (!) 118/55 114/60 114/63  Pulse:  77 89 89  Resp:  18  15  Temp:  97.8 F (36.6 C) 98.2 F (36.8 C) 98.3 F (36.8 C)  TempSrc:  Oral    SpO2:  96% 100% 98%  Weight:      Height:         Intake/Output Summary (Last 24 hours) at 10/10/2022 1615 Last data filed at 10/10/2022 0940 Gross per 24 hour  Intake 300 ml  Output --  Net 300 ml      10/07/2022    2:48 PM 10/02/2022   10:29 PM 08/06/2022    5:44 AM  Last 3 Weights  Weight (lbs) 195 lb 1.7 oz 195 lb 198 lb 8 oz  Weight (kg) 88.5 kg 88.451 kg 90.039 kg      Telemetry    Off  telemetry- Personally Reviewed  ECG     - Personally Reviewed  Physical Exam   GEN: No acute distress.   Neck: No JVD Cardiac: RRR, no murmurs, rubs, or gallops.  Respiratory: Clear to auscultation bilaterally. GI: Soft, nontender, non-distended  MS: No edema; No deformity.  Left knee Ace wrapped Neuro:  Nonfocal  Psych: Normal affect   Labs    High Sensitivity Troponin:  No results for input(s): "TROPONINIHS" in the last 720 hours.   Chemistry Recent Labs  Lab 10/05/22 0500 10/06/22 0528 10/08/22 0632 10/10/22 0620  NA 134* 140 141 142  K 4.8 4.2 4.6 3.9  CL 103 110 110 111  CO2 21* 23 20* 27  GLUCOSE 369* 274* 367* 207*  BUN 76* 53* 34* 48*  CREATININE 1.67* 1.12 1.06 1.02  CALCIUM 8.5* 8.5* 8.1* 8.1*  MG 2.2 2.3 2.3 2.3  PROT 5.7*  --   --   --   ALBUMIN 2.2*  --   --   --   AST 14*  --   --   --   ALT 13  --   --   --   ALKPHOS 100  --   --   --   BILITOT 1.1  --   --   --   GFRNONAA 42* >60 >60 >60  ANIONGAP '10 7 11 '$ 4*    Lipids No results for input(s): "CHOL", "TRIG", "HDL", "LABVLDL", "LDLCALC", "CHOLHDL" in the last 168 hours.  Hematology Recent Labs  Lab 10/08/22 2585 10/09/22 0822 10/10/22 0620  WBC 16.1* 18.3* 19.3*  RBC 3.98* 4.25 3.63*  HGB 11.6* 12.4* 10.8*  HCT 35.7* 37.7* 31.1*  MCV 89.7 88.7 85.7  MCH 29.1 29.2 29.8  MCHC 32.5 32.9 34.7  RDW 14.7 15.0 14.6  PLT 462* 546* 521*   Thyroid No results for input(s): "TSH", "FREET4" in the last 168 hours.  BNPNo results for input(s): "BNP", "PROBNP" in the last 168 hours.  DDimer No results for input(s): "DDIMER" in the last 168  hours.   Radiology    MR KNEE LEFT W WO CONTRAST  Result Date: 10/10/2022 CLINICAL DATA:  Septic arthritis suspected. Joint effusion and synovial thickening on previous MRI. Patient underwent arthroscopic irrigation and debridement with extensive partial synovectomy and partial medial meniscectomy on 10/03/2022. Subsequent arthroscopic irrigation and debridement 10/07/2022 with cultures growing MRSA. EXAM: MRI OF THE LEFT KNEE WITHOUT AND WITH CONTRAST TECHNIQUE: Multiplanar, multisequence MR imaging of the left knee was performed both before and after administration of intravenous contrast. CONTRAST:  46m GADAVIST GADOBUTROL 1 MMOL/ML IV SOLN COMPARISON:  MRI 10/03/2022.  Radiographs 10/02/2022. FINDINGS: Bones/Joint/Cartilage Interval increased serpiginous T2 marrow hyperintensity posteriorly in the medial femoral condyle and medial tibial plateau without significant enhancement. This is not typical for osteomyelitis, and could reflect bone infarcts. No cortical destruction or suspicious marrow enhancement identified. There is low-level periarticular edema which is nonspecific. A small amount of air within the knee joint is attributed to the recent surgery. Unchanged moderate size knee joint effusion with diffuse synovial enhancement. Ligaments The cruciate and collateral ligaments remain intact. Postsurgical changes are present within the medial meniscus. Muscles and Tendons Intact extensor mechanism. No intramuscular fluid collection or abnormal enhancement. Soft tissue Prepatellar subcutaneous edema without focal fluid collection. Stable small Baker cyst with associated synovial enhancement. IMPRESSION: 1. Unchanged moderate size knee joint effusion with diffuse synovial enhancement, consistent with known septic arthritis. 2. Interval increased serpiginous T2 hyperintensity posteriorly in the medial femoral condyle and medial tibial plateau without significant enhancement. This is not typical for  osteomyelitis, and could reflect bone infarcts. Early osteomyelitis is not completely excluded, and continued follow-up should be considered. 3. No evidence of soft tissue abscess. 4. Postsurgical changes in the medial meniscus. Electronically Signed   By: WRichardean SaleM.D.   On: 10/10/2022 11:23   [place]KG SITE RITE  Result Date: 10/09/2022 If Site Rite image not attached, placement could not be confirmed due to current cardiac rhythm.   Cardiac Studies   TEE 09/2022  1. Left ventricular ejection fraction, by estimation, is 35 to 40%. The  left ventricle has moderately decreased function.   2. Right ventricular systolic function is normal. The right ventricular  size is normal.   3. No left atrial/left atrial appendage thrombus was detected.  4. The mitral valve is normal in structure. No evidence of mitral valve  regurgitation.   5. The aortic valve is tricuspid. Aortic valve regurgitation is not  visualized.   Patient Profile     76 y.o. male with history of CAD/PCI to LAD 2008, diabetes, hypertension presenting with left knee pain, diagnosed with septic arthritis and MRSA bacteremia.  Being seen for reduced ejection fraction, EF35 to 40%.   Assessment & Plan    MRSA bacteremia, septic arthritis Transesophageal echo completed this admission with no evidence of endocarditis Orthopedics has completed arthroscopy and washout -PICC line placed with plans for 6 weeks antibiotics   2.  Cardiomyopathy EF 35-40% Timing of drop in ejection fraction unclear, no recent echocardiogram available for comparison -Plan is to continue carvedilol 6.25 mg twice daily, Entresto 24-26 mg twice daily -Lasix 40 mg daily at discharge -Plan right and left heart cath after resolution of bacteremia/IV antibiotic treatment.    3.  CAD/PCI to LAD in 2008 Continue aspirin, Lipitor Plan for ischemic work-up as outpatient  4.  Left peroneal vein DVT -On therapeutic Eliquis as per medicine team.     Total encounter time more than 40 minutes  Greater than 50% was spent in counseling and coordination of care with the patient   For questions or updates, please contact [place] Please consult [email or web address] for contact info under        Signed, Ida Rogue, MD  10/10/2022, 4:15 PM

## 2022-10-10 NOTE — TOC Progression Note (Signed)
Transition of Care [facility]) - Progression Note    Patient Details  Name: [name] MRN: [id number] Date of Birth: [birth year]  Transition of Care [facility]) CM/SW [place], RN Phone Number: 10/10/2022, 10:43 AM  Clinical Narrative:     Saralyn Pilar the patient's partner reached out to me and let me know they have decided not to go to [facility], They want to go to [facility], I explained that [facility] had not made a bed offer but I would reach out to them to see if we can get that.  HE is to have a washout today and plan to DC on Monday  Expected Discharge Plan: [place] Barriers to Discharge: Continued Medical Work up  Expected Discharge Plan and Services Expected Discharge Plan: [place] arrangements for the past 2 months: Single Family Home                                       Social Determinants of Health (SDOH) Interventions    Readmission Risk Interventions     No data to display

## 2022-10-10 NOTE — Hospital Course (Addendum)
This 76 years old male with PMH significant for type 1 diabetes on insulin pump, history of left leg DVT, coronary artery disease s/p stent angioplasty, GERD, history of bladder cancer, history of ureteral stricture s/p dilatation, prior history of MRSA infection presented in the ED for the evaluation of left knee pain  for last 6 days resulting in an inability to bear weight or bend his left knee. Patient had an MRI of left knee which shows moderate knee joint effusion with synovial thickening suggesting synovitis. He is also found to have complex tear of posterior horn of medial meniscus. Orthopedics was consulted and patient was taken to the OR for arthroscopy and washout of left knee.  Tolerated well. Blood cultures + for MRSA, underwent TEE negative for vegetation, needs 6 weeks of iv antibiotics, PICC line placed.   12/3: LLE Doppler negative for DVT

## 2022-10-10 NOTE — Progress Notes (Signed)
Progress Note   Patient: [name] XTG:[id number] DOB: [birth year] DOA: 10/02/2022     7 DOS: the patient was seen and examined on 10/10/2022   Brief hospital course: This 76 years old male with PMH significant for type 1 diabetes on insulin pump, history of left leg DVT, coronary artery disease s/p stent angioplasty, GERD, history of bladder cancer, history of ureteral stricture s/p dilatation, prior history of MRSA infection presented in the ED for the evaluation of left knee pain  for last 6 days resulting in an inability to bear weight or bend his left knee. Patient had an MRI of left knee which shows moderate knee joint effusion with synovial thickening suggesting synovitis. He is also found to have complex tear of posterior horn of medial meniscus. Orthopedics was consulted and patient was taken to the OR for arthroscopy and washout of left knee.  Tolerated well. Blood cultures + for MRSA, underwent TEE negative for vegetation, needs 6 weeks of iv antibiotics, PICC line to be placed.   Assessment and Plan: Septic arthritis of left knee: MRSA Bacteremia: Patient presented for the evaluation of worsening left knee pain and swelling. He was unable to bear weight on left leg. MRI concerning for septic arthritis and meniscal tear. He underwent extensive arthroscopic debridement with partial meniscectomy. Empirically started on vancomycin and Rocephin. Blood cultures + for MRSA Infectious diseases consulted, antibiotics changed to daptomycin given worsening renal functions.  He underwent repeat arthroscopic irrigation and debridement.  TEE did not show any endocarditis. Infectious disease recommended daptomycin for 6 weeks.  Last day would be  (November 15, 2022) Patient had a PICC line placed yesterday. Due to worsening leukocytosis, orthopedics will bring patient to the OR again for joint irrigation and debridement. Continue to monitor patient in the hospital.   Acute systolic heart  failure: Orthostatic hypotension. TTE shows LVEF 35 to 40% with reduced left ventricular function. TEE: No vegetations Cardiology consulted, started on Entresto.   Continue Coreg and escalate GDMT as tolerated. Plan for right and left heart cath after resolution of bacteremia.  Will be planned as an outpatient. He currently is euvolemic, he developed orthostatic hypotension.  I will discontinue IV furosemide and change to oral.  Uncontrolled type 1 diabetes mellitus with hyperglycemia: Insulin pump is on hold, continue long-acting insulin and sliding scale insulin.  Glucose running high today, due to n.p.o. status tonight, and will continue current regimen and adjust dose after surgery.   Acute kidney injury : > Resolved.   Left peroneal vein DVT: Patient noted to have nonocclusive thrombus seen within the left calf peroneal vein. Eliquis started.   Coronary artery disease, native vessel. Stable. Continue Coreg, Lipitor, and aspirin.   Essential hypertension Continue Coreg.   Hypophosphatemia: Replaced.  Continue to monitor.      Subjective:  Patient is constipated, has not had a bowel movement for the last few days.  No short of breath or cough, no hypoxia.  Physical Exam: Vitals:   10/09/22 2130 10/09/22 2205 10/10/22 0114 10/10/22 0820  BP: (!) 100/55 (!) 110/52 (!) 118/55 114/60  Pulse: 82  77 89  Resp: 20  18   Temp: 97.6 F (36.4 C)  97.8 F (36.6 C) 98.2 F (36.8 C)  TempSrc:   Oral   SpO2: 100%  96% 100%  Weight:      Height:       General exam: Appears calm and comfortable  Respiratory system: Clear to auscultation. Respiratory effort normal. Cardiovascular system:  S1 & S2 heard, RRR. No JVD, murmurs, rubs, gallops or clicks. Gastrointestinal system: Abdomen is nondistended, soft and nontender. No organomegaly or masses felt. Normal bowel sounds heard. Central nervous system: Alert and oriented. No focal neurological deficits. Extremities: Trace leg  edema. Skin: No rashes, lesions or ulcers Psychiatry: Judgement and insight appear normal. Mood & affect appropriate.   Data Reviewed:  Reviewed lab results.  Family Communication: Partner at bedside, updated.  Disposition: Status is: Inpatient Remains inpatient appropriate because: Severity of disease, IV treatment, inpatient procedure.  Planned Discharge Destination: Skilled nursing facility    Time spent: 35 minutes  Author: Sharen Hones, MD 10/10/2022 12:27 PM  For on call review [email or web address].

## 2022-10-11 ENCOUNTER — Encounter
Admission: EM | Disposition: A | Discharge status: Skilled Nursing Facility | Payer: Self-pay | Source: Home / Self Care | Attending: Family Medicine

## 2022-10-11 DIAGNOSIS — E1065 Type 1 diabetes mellitus with hyperglycemia: Secondary | ICD-10-CM | POA: Diagnosis not present

## 2022-10-11 DIAGNOSIS — R7881 Bacteremia: Secondary | ICD-10-CM | POA: Diagnosis not present

## 2022-10-11 DIAGNOSIS — B9562 Methicillin resistant Staphylococcus aureus infection as the cause of diseases classified elsewhere: Secondary | ICD-10-CM | POA: Diagnosis not present

## 2022-10-11 DIAGNOSIS — M00062 Staphylococcal arthritis, left knee: Secondary | ICD-10-CM | POA: Diagnosis not present

## 2022-10-11 LAB — BASIC METABOLIC PANEL
Anion gap: 6 (ref 5–15)
BUN: 39 mg/dL — ABNORMAL HIGH (ref 8–23)
CO2: 27 mmol/L (ref 22–32)
Calcium: 8 mg/dL — ABNORMAL LOW (ref 8.9–10.3)
Chloride: 107 mmol/L (ref 98–111)
Creatinine, Ser: 0.99 mg/dL (ref 0.61–1.24)
GFR, Estimated: >60 mL/min (ref >60)
Glucose, Bld: 218 mg/dL — ABNORMAL HIGH (ref 70–99)
Potassium: 4.2 mmol/L (ref 3.5–5.1)
Sodium: 140 mmol/L (ref 135–145)

## 2022-10-11 LAB — CBC
HCT: 27.9 % — ABNORMAL LOW (ref 39.0–52.0)
Hemoglobin: 9.7 g/dL — ABNORMAL LOW (ref 13.0–17.0)
MCH: 29.9 pg (ref 26.0–34.0)
MCHC: 34.8 g/dL (ref 30.0–36.0)
MCV: 86.1 fL (ref 80.0–100.0)
Platelets: 458 10*3/uL — ABNORMAL HIGH (ref 150–400)
RBC: 3.24 MIL/uL — ABNORMAL LOW (ref 4.22–5.81)
RDW: 14.6 % (ref 11.5–15.5)
WBC: 14.7 10*3/uL — ABNORMAL HIGH (ref 4.0–10.5)
nRBC: 0 % (ref 0.0–0.2)

## 2022-10-11 LAB — GLUCOSE, CAPILLARY
Glucose-Capillary: 199 mg/dL — ABNORMAL HIGH (ref 70–99)
Glucose-Capillary: 125 mg/dL — ABNORMAL HIGH (ref 70–99)
Glucose-Capillary: 189 mg/dL — ABNORMAL HIGH (ref 70–99)
Glucose-Capillary: 182 mg/dL — ABNORMAL HIGH (ref 70–99)

## 2022-10-11 LAB — MAGNESIUM: Magnesium: 2.1 mg/dL (ref 1.7–2.4)

## 2022-10-11 SURGERY — ARTHROSCOPY, KNEE
Anesthesia: Choice | Site: Knee | Laterality: Left

## 2022-10-11 MED ORDER — PEG 3350-KCL-NA BICARB-NACL 420 G PO SOLR
2000.0000 mL | Freq: Once | ORAL | Status: DC
  Filled 2022-10-11: qty 4000

## 2022-10-11 MED ORDER — FUROSEMIDE 40 MG PO TABS: 40.0000 mg | ORAL_TABLET | Freq: Every day | ORAL | Status: DC

## 2022-10-11 MED ORDER — FUROSEMIDE 40 MG PO TABS
40.0000 mg | ORAL_TABLET | Freq: Every day | ORAL | Status: DC
  Administered 2022-10-12 – 2022-10-14 (×3): 40 mg via ORAL
  Filled 2022-10-11 (×3): qty 1

## 2022-10-11 MED ORDER — LACTULOSE 10 GM/15ML PO SOLN
20.0000 g | Freq: Once | ORAL | Status: AC
  Administered 2022-10-11: 20 g via ORAL
  Filled 2022-10-11: qty 30

## 2022-10-11 MED ORDER — POLYETHYLENE GLYCOL 3350 17 G PO PACK
17.0000 g | PACK | Freq: Once | ORAL | Status: AC
  Administered 2022-10-11: 17 g via ORAL
  Filled 2022-10-11: qty 1

## 2022-10-11 NOTE — Progress Notes (Signed)
Report received by dayshift CN pt to be given tap water enema; pt tolerated 535m of fluid; small bm. Visible stool coming out. Attempted to assist pt to push out stool; refuses states too painful; offered to finish enema later in the shift. Pt refuses for now would like to rest.

## 2022-10-11 NOTE — Plan of Care (Signed)

## 2022-10-11 NOTE — Progress Notes (Signed)
Patient resting. Transferred patient from bed ot chair earlier. Patient tolerated well. Patient complains of abdominal pain and gas. Pending BM, medications given. Call bell in reach. No needs at this time per patient. Patient encouraged with mobility. Will continue to round on patient per unit routine.

## 2022-10-11 NOTE — Progress Notes (Signed)
   10/11/22 0850  Provider Notification  Provider Name/Title Zhang MD  Date Provider Notified 10/11/22  Time Provider Notified [phone]  Method of Notification  (secure chat)  Notification Reason Other (Comment) (urine color dark amber/brown, blood tinged)  Provider response No new orders ("no urinary symptoms, no fever")  Date of Provider Response 10/11/22  Time of Provider Response [phone]

## 2022-10-11 NOTE — Inpatient Diabetes Management (Signed)
Inpatient Diabetes Program Recommendations  AACE/ADA: New Consensus Statement on Inpatient Glycemic Control (2015)  Target Ranges:  Prepandial:   less than 140 mg/dL      [facility] postprandial:   less than 180 mg/dL (1-2 hours)      Critically ill patients:  140 - 180 mg/dL    Latest Reference Range & Units 10/10/22 08:08 10/10/22 11:53 10/10/22 16:44 10/10/22 20:48  Glucose-Capillary 70 - 99 mg/dL 189 (H)  10 units Novolog '@0942'$  247 (H)  7 units Novolog '@1320'$   28 units Semglee '@1003'$  209 (H)  Pt Refused Novolog 202 (H)  (H): Data is abnormally high  Latest Reference Range & Units 10/11/22 08:13  Glucose-Capillary 70 - 99 mg/dL 199 (H)  (H): Data is abnormally high    History: Type 1 diabetes   Home DM Meds: Insulin Pump   Current Orders: Semglee 28 units Daily                            Novolog Resistant Correction Scale/ SSI (0-20 units) TID AC                            Novolog 6 units TID with meals   MD- Please consider:  1. Increase Semglee slightly to 30 units Daily  2. Increase Novolog Meal Coverage to 8 units TID with meals   --Will follow patient during hospitalization--  Wyn Quaker RN, MSN, [place] Diabetes Coordinator Inpatient Glycemic Control Team Team Pager: [phone] (8a-5p)

## 2022-10-11 NOTE — Progress Notes (Signed)
Progress Note   Patient: [name] TTS:[id number] DOB: [birth year] DOA: 10/02/2022     8 DOS: the patient was seen and examined on 10/11/2022   Brief hospital course: This 76 years old male with PMH significant for type 1 diabetes on insulin pump, history of left leg DVT, coronary artery disease s/p stent angioplasty, GERD, history of bladder cancer, history of ureteral stricture s/p dilatation, prior history of MRSA infection presented in the ED for the evaluation of left knee pain  for last 6 days resulting in an inability to bear weight or bend his left knee. Patient had an MRI of left knee which shows moderate knee joint effusion with synovial thickening suggesting synovitis. He is also found to have complex tear of posterior horn of medial meniscus. Orthopedics was consulted and patient was taken to the OR for arthroscopy and washout of left knee.  Tolerated well. Blood cultures + for MRSA, underwent TEE negative for vegetation, needs 6 weeks of iv antibiotics, PICC line placed.   Assessment and Plan: Septic arthritis of left knee: MRSA Bacteremia: Patient presented for the evaluation of worsening left knee pain and swelling. He was unable to bear weight on left leg. MRI concerning for septic arthritis and meniscal tear. He underwent extensive arthroscopic debridement with partial meniscectomy. Empirically started on vancomycin and Rocephin. Blood cultures + for MRSA Infectious diseases consulted, antibiotics changed to daptomycin given worsening renal functions.  He underwent repeat arthroscopic irrigation and debridement.  TEE did not show any endocarditis. Infectious disease recommended daptomycin for 6 weeks.  Last day would be  (November 15, 2022) Patient had a PICC line placed. Leukocytosis is better today, initially planned for joint washout for today is canceled. Patient is medically stable to be discharged, currently pending insurance approval for nursing home  placement.  Severe constipation. Patient has not had a bowel movement for at least 7 days.  She received lactulose, tapwater enema yesterday, only had a small hard bowel movement.  Will continue working on the bowel today, cannot discharge until has a large bowel movement.  Acute systolic heart failure: Orthostatic hypotension. TTE shows LVEF 35 to 40% with reduced left ventricular function. TEE: No vegetations Cardiology consulted, started on Entresto.   Continue Coreg and escalate GDMT as tolerated. Plan for right and left heart cath after resolution of bacteremia.  Will be planned as an outpatient. He currently is euvolemic, he developed orthostatic hypotension.  Continue oral furosemide.   Uncontrolled type 1 diabetes mellitus with hyperglycemia: Insulin pump is on hold, continue long-acting insulin and sliding scale insulin.   Continue current dose of long-acting insulin and sliding scale insulin.  May change back to insulin pump at the time of discharge.   Acute kidney injury : > Resolved.   Left peroneal vein DVT: Patient noted to have nonocclusive thrombus seen within the left calf peroneal vein. Eliquis started.   Coronary artery disease, native vessel. Stable. Continue Coreg, Lipitor, and aspirin.   Essential hypertension Continue Coreg.   Hypophosphatemia: Replaced.  Continue to monitor.      Subjective:  Received multiple stool softeners yesterday and a tapwater enema, only resulted in a small hard stool.  Currently patient has no abdominal pain or nausea vomiting.  Physical Exam: Vitals:   10/10/22 0820 10/10/22 1544 10/10/22 2050 10/11/22 0813  BP: 114/60 114/63 (!) 141/68 (!) 140/71  Pulse: 89 89 87 90  Resp:  '15 17 17  '$ Temp: 98.2 F (36.8 C) 98.3 F (36.8 C) 98.2 F (  36.8 C) (!) 97.5 F (36.4 C)  TempSrc:   Oral   SpO2: 100% 98% 98% 98%  Weight:      Height:       General exam: Appears calm and comfortable  Respiratory system: Clear to  auscultation. Respiratory effort normal. Cardiovascular system: S1 & S2 heard, RRR. No JVD, murmurs, rubs, gallops or clicks. No pedal edema. Gastrointestinal system: Abdomen is nondistended, soft and nontender. No organomegaly or masses felt. Normal bowel sounds heard. Central nervous system: Alert and oriented. No focal neurological deficits. Extremities: Symmetric 5 x 5 power. Skin: No rashes, lesions or ulcers Psychiatry: Judgement and insight appear normal. Mood & affect appropriate.   Data Reviewed:  Lab results reviewed.  Family Communication: Significant other updated.  Disposition: Status is: Inpatient Remains inpatient appropriate because: Severity of disease.  Planned Discharge Destination: Skilled nursing facility    Time spent: 35 minutes  Author: Sharen Hones, MD 10/11/2022 9:55 AM  For on call review [email or web address].

## 2022-10-11 NOTE — TOC Progression Note (Signed)
Transition of Care [facility]) - Progression Note    Patient Details  Name: [name] MRN: [id number] Date of Birth: 02/03/1946  Transition of Care [facility]) CM/SW [place], RN Phone Number: 10/11/2022, 9:49 AM  Clinical Narrative:    The patient plans to go to [facility] at [employer] he will need a BM, Ins auth started and is pending, ref number [id number]   Expected Discharge Plan: [place] Barriers to Discharge: Insurance Authorization  Expected Discharge Plan and Services Expected Discharge Plan: [place]       Living arrangements for the past 2 months: Single Family Home                                       Social Determinants of Health (SDOH) Interventions    Readmission Risk Interventions     No data to display

## 2022-10-11 NOTE — Progress Notes (Signed)
Physical Therapy Treatment Patient Details Name: [name] MRN: [id number] DOB: 03/02/1946 Today's Date: 10/11/2022   History of Present Illness Patient is a 76 year old male presented to ER and admitted secondary to progressive L knee pain, s/p arthroscopic debridement, partial menisectomy (10/03/22); hospital stay complicated by uncontrolled DM. Nonocclusive left peroneal DVT with history of left lower extremity DVT. Patient underwent Arthroscopic irrigation and debridement, left knee for septic arthritis on 10/07/22    PT Comments    Pt was sitting in recliner upon arriving. He endorses feeling poorly.C/o back and stomach pain. Pt is overall lethargic. Agrees to session however severely limited by hypotension upon standing. Prior to standing BP 117/72, standing 76/52, return to sitting with BP returning to 115/69. Pt endorses severe dizziness that somewhat resolved with sitting. RN and MD made aware. PT continues to recommend Dc to SNF to maximize independence while assisting pt to PLOF.     Recommendations for follow up therapy are one component of a multi-disciplinary discharge planning process, led by the attending physician.  Recommendations may be updated based on patient status, additional functional criteria and insurance authorization.  Follow Up Recommendations  Skilled nursing-short term rehab (<3 hours/day) Can patient physically be transported by private vehicle: Yes   Assistance Recommended at Discharge Frequent or constant Supervision/Assistance  Patient can return home with the following A little help with walking and/or transfers;Assistance with cooking/housework;Assist for transportation;Help with stairs or ramp for entrance;A little help with bathing/dressing/bathroom;Direct supervision/assist for medications management   Equipment Recommendations  Other (comment)       Precautions / Restrictions Precautions Precautions: Fall Restrictions Weight Bearing  Restrictions: Yes LLE Weight Bearing: Weight bearing as tolerated     Mobility  Bed Mobility  General bed mobility comments: In recliner pre/post session. BP prior to session 117/72    Transfers Overall transfer level: Needs assistance Equipment used: Rolling walker (2 wheels) Transfers: Sit to/from Stand Sit to Stand: Min assist   General transfer comment: pt stood from recliner > RW with min assist. He c/o severe dizziness upon standing. BP 76/52. Quuickly recliner in recliner with BP elevation back to 115/69. He continues to endorse feeling poorly    Ambulation/Gait  General Gait Details: unable to ambulate today due to orthostatic hypotension.   Balance Overall balance assessment: Needs assistance Sitting-balance support: Feet supported, Bilateral upper extremity supported Sitting balance-Leahy Scale: Good       Cognition Arousal/Alertness: Lethargic Behavior During Therapy: Restless Overall Cognitive Status: Within Functional Limits for tasks assessed Area of Impairment: Attention, Following commands, Safety/judgement, Awareness, Problem solving    Current Attention Level: Focused   Following Commands: Follows one step commands consistently, Follows one step commands with increased time Safety/Judgement: Decreased awareness of safety, Decreased awareness of deficits     General Comments: pt is alert but lethargic               Pertinent Vitals/Pain Pain Assessment Pain Assessment: 0-10 Pain Score: 8  Faces Pain Scale: Hurts whole lot Pain Location: back and butt Pain Descriptors / Indicators: Discomfort, Sore Pain Intervention(s): Limited activity within patient's tolerance, Monitored during session, Premedicated before session, Repositioned     PT Goals (current goals can now be found in the care plan section) Acute Rehab PT Goals Patient Stated Goal: Get better so I can go home." Progress towards PT goals: Progressing toward goals    Frequency     7X/week      PT Plan Current plan remains appropriate  AM-PAC PT "6 Clicks" Mobility   Outcome Measure  Help needed turning from your back to your side while in a flat bed without using bedrails?: A Little Help needed moving from lying on your back to sitting on the side of a flat bed without using bedrails?: A Little Help needed moving to and from a bed to a chair (including a wheelchair)?: A Little Help needed standing up from a chair using your arms (e.g., wheelchair or bedside chair)?: A Little Help needed to walk in hospital room?: A Little Help needed climbing 3-5 steps with a railing? : A Lot 6 Click Score: 17    End of Session   Activity Tolerance: Treatment limited secondary to medical complications (Comment);Other (comment) (limited by hypotension upon standing) Patient left: in chair;with call bell/phone within reach;with chair alarm set Nurse Communication: Mobility status PT Visit Diagnosis: Muscle weakness (generalized) (M62.81);Difficulty in walking, not elsewhere classified (R26.2);Pain Pain - Right/Left: Left Pain - part of body: Knee     Time: 1435-1450 PT Time Calculation (min) (ACUTE ONLY): 15 min  Charges:  $Therapeutic Activity: 8-22 mins                     Julaine Fusi PTA 10/11/22, 4:17 PM

## 2022-10-11 NOTE — Progress Notes (Signed)
ID    [name] is a 76 y.o. with a history of ca bladder, urethral stricture, frequent dilatation, bladder diverticulum, MRSA UTI in May 2023, CAD s/p stent, DM -insulin dependent, presents with left knee pain of more than week duration- had been to PCP and got intraarticular steroid injection  On 11/22 , prior to that he had gotten mobic for the pain. HE was also taking Aleve As no relief and was getting worse he came to the ED on 10/02/22- no fever or chills. MRSA bacteremia Left knee infection   subjective Main complaint is constipation X 7 days Not responding to stool softners, laxatives He will be getting golightly Patient Vitals for the past 24 hrs:  BP Temp Temp src Pulse Resp SpO2  10/11/22 0813 (!) 140/71 (!) 97.5 F (36.4 C) -- 90 17 98 %  10/10/22 2050 (!) 141/68 98.2 F (36.8 C) Oral 87 17 98 %  10/10/22 1544 114/63 98.3 F (36.8 C) -- 89 15 98 %  Some discomfort because of constipation  Chest cta Hss1s2 Abd soft Cns non focal Left knee dressing  Labs    Latest Ref Rng & Units 10/11/2022    5:45 AM 10/10/2022    6:20 AM 10/09/2022    8:22 AM  CBC  WBC 4.0 - 10.5 K/uL 14.7  19.3  18.3   Hemoglobin 13.0 - 17.0 g/dL 9.7  10.8  12.4   Hematocrit 39.0 - 52.0 % 27.9  31.1  37.7   Platelets 150 - 400 K/uL 458  521  546        Latest Ref Rng & Units 10/11/2022    5:45 AM 10/10/2022    6:20 AM 10/08/2022    6:32 AM  CMP  Glucose 70 - 99 mg/dL 218  207  367   BUN 8 - 23 mg/dL 39  48  34   Creatinine 0.61 - 1.24 mg/dL 0.99  1.02  1.06   Sodium 135 - 145 mmol/L 140  142  141   Potassium 3.5 - 5.1 mmol/L 4.2  3.9  4.6   Chloride 98 - 111 mmol/L 107  111  110   CO2 22 - 32 mmol/L _0 Calcium 8.9 - 10.3 mg/dL 8.0  8.1  8.1     Micro 11/23 BC - MRSA 11/25 /23 No growth 10/03/22 knee- MRSA 10/07/22 knee fluid - MRSA   Impression/recommendation  MRSA bacteremia  MRSA septic knee s/p I/D x 2 TEE - no endocarditis Repeat blood culture NG  so far On daptomycin- will need for 6 weeks- 11/15/22  Constipation is the main complaint now and is being treated   AKI resolved  DM- not well controlled  Non occlusive left peroneal vein thrombus  Ca bladder   Urethral stricture with frequent dilatation MRSA uTI in May 2023  CAD s/p stent HE is on atorvastatin While on dapto watch closely for rhabdomyolysis  Discussed the management with the patient in detail   OPAT  Diagnosis: MRSA bacteremia Left knee septic arthritis    No Known Allergies  Discharge antibiotics: Daptomycin 3m/kg body weight- 7082mIV PB every 24 hours for 6 weeks Duration: 6 weeks End Date:11/15/22   [facility]:  Labs weekly while on IV antibiotics: _X_ CBC with differential  _X_ CMP _X_ CRP _X_ ESR _X_ CK   _X_ Please pull PIC at completion of IV antibiotics   Fax weekly lab results  promptly to [phone]  [phone]  Clinic Follow Up Appt:10/31/22 at 10.45 AM   Call [phone] with any questions

## 2022-10-11 NOTE — Progress Notes (Signed)
Rounding Note    Patient Name: [name] Date of Encounter: 10/11/2022  [place] Cardiologist: Ida Rogue, MD   Subjective   Patient reports lower abdominal pain. He denies chest pain or shortness of breath.   Inpatient Medications    Scheduled Meds:  apixaban  10 mg Oral BID   Followed by   Derrill Memo ON 10/14/2022] apixaban  5 mg Oral BID   atorvastatin  40 mg Oral Daily   bisacodyl  5 mg Oral BID   carvedilol  6.25 mg Oral BID WC   Chlorhexidine Gluconate Cloth  6 each Topical Daily   cyanocobalamin  1,000 mcg Oral QODAY   docusate sodium  200 mg Oral BID   famotidine  20 mg Oral BID   [START ON 10/12/2022] furosemide  40 mg Oral Daily   insulin aspart  0-20 Units Subcutaneous TID WC   insulin aspart  6 Units Subcutaneous TID WC   insulin glargine-yfgn  28 Units Subcutaneous Daily   multivitamin with minerals  1 tablet Oral Daily   sacubitril-valsartan  1 tablet Oral BID   senna  1 tablet Oral BID   sodium chloride flush  10-40 mL Intracatheter Q12H   Continuous Infusions:  DAPTOmycin (CUBICIN) 700 mg in sodium chloride 0.9 % IVPB 700 mg (10/10/22 2047)   PRN Meds: acetaminophen, bisacodyl, diphenhydrAMINE, hyoscyamine, magnesium hydroxide, metoCLOPramide **OR** metoCLOPramide (REGLAN) injection, morphine injection, ondansetron **OR** ondansetron (ZOFRAN) IV, mouth rinse, oxyCODONE, phenol, sodium chloride flush, sodium phosphate   Vital Signs    Vitals:   10/10/22 0820 10/10/22 1544 10/10/22 2050 10/11/22 0813  BP: 114/60 114/63 (!) 141/68 (!) 140/71  Pulse: 89 89 87 90  Resp:  '15 17 17  '$ Temp: 98.2 F (36.8 C) 98.3 F (36.8 C) 98.2 F (36.8 C) (!) 97.5 F (36.4 C)  TempSrc:   Oral   SpO2: 100% 98% 98% 98%  Weight:      Height:        Intake/Output Summary (Last 24 hours) at 10/11/2022 1057 Last data filed at 10/11/2022 1009 Gross per 24 hour  Intake --  Output 1000 ml  Net -1000 ml      10/07/2022    2:48 PM 10/02/2022    10:29 PM 08/06/2022    5:44 AM  Last 3 Weights  Weight (lbs) 195 lb 1.7 oz 195 lb 198 lb 8 oz  Weight (kg) 88.5 kg 88.451 kg 90.039 kg      Telemetry    N/A - Personally Reviewed  ECG    NO new - Personally Reviewed  Physical Exam   GEN: No acute distress.   Neck: No JVD Cardiac: RRR, no murmurs, rubs, or gallops.  Respiratory: Clear to auscultation bilaterally. GI: Soft, nontender, non-distended  MS: No edema; No deformity. Neuro:  Nonfocal  Psych: Normal affect   Labs    High Sensitivity Troponin:  No results for input(s): "TROPONINIHS" in the last 720 hours.   Chemistry Recent Labs  Lab 10/05/22 0500 10/06/22 0528 10/08/22 0632 10/10/22 0620 10/11/22 0545  NA 134*   < > 141 142 140  K 4.8   < > 4.6 3.9 4.2  CL 103   < > 110 111 107  CO2 21*   < > 20* 27 27  GLUCOSE 369*   < > 367* 207* 218*  BUN 76*   < > 34* 48* 39*  CREATININE 1.67*   < > 1.06 1.02 0.99  CALCIUM 8.5*   < >  8.1* 8.1* 8.0*  MG 2.2   < > 2.3 2.3 2.1  PROT 5.7*  --   --   --   --   ALBUMIN 2.2*  --   --   --   --   AST 14*  --   --   --   --   ALT 13  --   --   --   --   ALKPHOS 100  --   --   --   --   BILITOT 1.1  --   --   --   --   GFRNONAA 42*   < > >60 >60 >60  ANIONGAP 10   < > 11 4* 6   < > = values in this interval not displayed.    Lipids No results for input(s): "CHOL", "TRIG", "HDL", "LABVLDL", "LDLCALC", "CHOLHDL" in the last 168 hours.  Hematology Recent Labs  Lab 10/09/22 0822 10/10/22 0620 10/11/22 0545  WBC 18.3* 19.3* 14.7*  RBC 4.25 3.63* 3.24*  HGB 12.4* 10.8* 9.7*  HCT 37.7* 31.1* 27.9*  MCV 88.7 85.7 86.1  MCH 29.2 29.8 29.9  MCHC 32.9 34.7 34.8  RDW 15.0 14.6 14.6  PLT 546* 521* 458*   Thyroid No results for input(s): "TSH", "FREET4" in the last 168 hours.  BNPNo results for input(s): "BNP", "PROBNP" in the last 168 hours.  DDimer No results for input(s): "DDIMER" in the last 168 hours.   Radiology    MR KNEE LEFT W WO CONTRAST  Result Date:  10/10/2022 CLINICAL DATA:  Septic arthritis suspected. Joint effusion and synovial thickening on previous MRI. Patient underwent arthroscopic irrigation and debridement with extensive partial synovectomy and partial medial meniscectomy on 10/03/2022. Subsequent arthroscopic irrigation and debridement 10/07/2022 with cultures growing MRSA. EXAM: MRI OF THE LEFT KNEE WITHOUT AND WITH CONTRAST TECHNIQUE: Multiplanar, multisequence MR imaging of the left knee was performed both before and after administration of intravenous contrast. CONTRAST:  64m GADAVIST GADOBUTROL 1 MMOL/ML IV SOLN COMPARISON:  MRI 10/03/2022.  Radiographs 10/02/2022. FINDINGS: Bones/Joint/Cartilage Interval increased serpiginous T2 marrow hyperintensity posteriorly in the medial femoral condyle and medial tibial plateau without significant enhancement. This is not typical for osteomyelitis, and could reflect bone infarcts. No cortical destruction or suspicious marrow enhancement identified. There is low-level periarticular edema which is nonspecific. A small amount of air within the knee joint is attributed to the recent surgery. Unchanged moderate size knee joint effusion with diffuse synovial enhancement. Ligaments The cruciate and collateral ligaments remain intact. Postsurgical changes are present within the medial meniscus. Muscles and Tendons Intact extensor mechanism. No intramuscular fluid collection or abnormal enhancement. Soft tissue Prepatellar subcutaneous edema without focal fluid collection. Stable small Baker cyst with associated synovial enhancement. IMPRESSION: 1. Unchanged moderate size knee joint effusion with diffuse synovial enhancement, consistent with known septic arthritis. 2. Interval increased serpiginous T2 hyperintensity posteriorly in the medial femoral condyle and medial tibial plateau without significant enhancement. This is not typical for osteomyelitis, and could reflect bone infarcts. Early osteomyelitis is not  completely excluded, and continued follow-up should be considered. 3. No evidence of soft tissue abscess. 4. Postsurgical changes in the medial meniscus. Electronically Signed   By: WRichardean SaleM.D.   On: 10/10/2022 11:23    Cardiac Studies   TEE 09/2022  1. Left ventricular ejection fraction, by estimation, is 35 to 40%. The  left ventricle has moderately decreased function.   2. Right ventricular systolic function is normal. The right ventricular  size  is normal.   3. No left atrial/left atrial appendage thrombus was detected.   4. The mitral valve is normal in structure. No evidence of mitral valve  regurgitation.   5. The aortic valve is tricuspid. Aortic valve regurgitation is not  visualized.   Patient Profile     76 y.o. male with history of CAD/PCI to LAD 2008, diabetes, hypertension presenting with left knee pain, diagnosed with septic arthritis and MRSA bacteremia.  Being seen for reduced ejection fraction, EF35 to 40%.   Assessment & Plan    MRSA bacteremia Septic arthritis - TEE with no endocarditis - PICC line placed - pending nursing home placement  Cardiomyopathy, EF 35-40% - continue Coreg and Entresto 24-'26mg'$ BID - lasix '40mg'$  at discharge - has lower leg edema on exam - plan for R/L heart cath as outpatient  CAD/PCI to LAD in 2008 - no chest pain reported - continue Lipitor and Coreg - No ASA with Eliquis - plan for ischemic work-up as outpatient  Left peronea vein DVT - continue Eliquis '5mg'$  BID per IM  For questions or updates, please contact [place] Please consult [email or web address] for contact info under        Signed, Noriko Macari Ninfa Meeker, [name]  10/11/2022, 10:57 AM
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
## 2022-10-12 DIAGNOSIS — E1065 Type 1 diabetes mellitus with hyperglycemia: Secondary | ICD-10-CM | POA: Diagnosis not present

## 2022-10-12 DIAGNOSIS — I5041 Acute combined systolic (congestive) and diastolic (congestive) heart failure: Secondary | ICD-10-CM | POA: Diagnosis present

## 2022-10-12 LAB — BASIC METABOLIC PANEL
Anion gap: 4 — ABNORMAL LOW (ref 5–15)
BUN: 31 mg/dL — ABNORMAL HIGH (ref 8–23)
CO2: 30 mmol/L (ref 22–32)
Calcium: 8 mg/dL — ABNORMAL LOW (ref 8.9–10.3)
Chloride: 107 mmol/L (ref 98–111)
Creatinine, Ser: 0.93 mg/dL (ref 0.61–1.24)
GFR, Estimated: >60 mL/min (ref >60)
Glucose, Bld: 248 mg/dL — ABNORMAL HIGH (ref 70–99)
Potassium: 4 mmol/L (ref 3.5–5.1)
Sodium: 141 mmol/L (ref 135–145)

## 2022-10-12 LAB — AEROBIC/ANAEROBIC CULTURE W GRAM STAIN (SURGICAL/DEEP WOUND)

## 2022-10-12 LAB — CBC WITH DIFFERENTIAL/PLATELET
Abs Immature Granulocytes: 0.11 10*3/uL — ABNORMAL HIGH (ref 0.00–0.07)
Basophils Absolute: 0 10*3/uL (ref 0.0–0.1)
Basophils Relative: 0 %
Eosinophils Absolute: 0.1 10*3/uL (ref 0.0–0.5)
Eosinophils Relative: 1 %
HCT: 28.3 % — ABNORMAL LOW (ref 39.0–52.0)
Hemoglobin: 9.3 g/dL — ABNORMAL LOW (ref 13.0–17.0)
Immature Granulocytes: 1 %
Lymphocytes Relative: 7 %
Lymphs Abs: 1 10*3/uL (ref 0.7–4.0)
MCH: 29.2 pg (ref 26.0–34.0)
MCHC: 32.9 g/dL (ref 30.0–36.0)
MCV: 88.7 fL (ref 80.0–100.0)
Monocytes Absolute: 1.4 10*3/uL — ABNORMAL HIGH (ref 0.1–1.0)
Monocytes Relative: 11 %
Neutro Abs: 10.8 10*3/uL — ABNORMAL HIGH (ref 1.7–7.7)
Neutrophils Relative %: 80 %
Platelets: 480 10*3/uL — ABNORMAL HIGH (ref 150–400)
RBC: 3.19 MIL/uL — ABNORMAL LOW (ref 4.22–5.81)
RDW: 14.5 % (ref 11.5–15.5)
WBC: 13.5 10*3/uL — ABNORMAL HIGH (ref 4.0–10.5)
nRBC: 0 % (ref 0.0–0.2)

## 2022-10-12 LAB — GLUCOSE, POCT (MANUAL RESULT ENTRY): POC Glucose: 218 mg/dl — AB (ref 70–99)

## 2022-10-12 LAB — PHOSPHORUS: Phosphorus: 3.1 mg/dL (ref 2.5–4.6)

## 2022-10-12 LAB — MAGNESIUM: Magnesium: 2.2 mg/dL (ref 1.7–2.4)

## 2022-10-12 LAB — GLUCOSE, CAPILLARY
Glucose-Capillary: 220 mg/dL — ABNORMAL HIGH (ref 70–99)
Glucose-Capillary: 185 mg/dL — ABNORMAL HIGH (ref 70–99)
Glucose-Capillary: 186 mg/dL — ABNORMAL HIGH (ref 70–99)

## 2022-10-12 MED ORDER — ONDANSETRON HCL 4 MG PO TABS
4.0000 mg | ORAL_TABLET | Freq: Once | ORAL | Status: AC
  Administered 2022-10-12: 4 mg via ORAL
  Filled 2022-10-12: qty 1

## 2022-10-12 NOTE — Plan of Care (Signed)

## 2022-10-12 NOTE — Progress Notes (Signed)
PROGRESS NOTE    [name]  SWN:[id number] DOB: October 05, 1946 DOA: 10/02/2022 PCP: [name], MD  [id number]/[id number]-AA  LOS: 9 days   Brief hospital course: [name] is a 76 years old male with PMH significant for type 1 diabetes on insulin pump, history of left leg DVT, coronary artery disease s/p stent angioplasty, history of bladder cancer, history of ureteral stricture s/p dilatation, prior history of MRSA infection presented in the ED for the evaluation of left knee pain for last 6 days resulting in an inability to bear weight or bend his left knee. Patient had an MRI of left knee which showed moderate knee joint effusion with synovial thickening suggesting synovitis. He is also found to have complex tear of posterior horn of medial meniscus. Orthopedics was consulted and patient was taken to the OR for arthroscopy and washout of left knee.  Blood cultures + for MRSA, underwent TEE negative for vegetation, needs 6 weeks of iv antibiotics, PICC line placed.    Assessment & Plan: Septic arthritis of left knee: MRSA Bacteremia: Patient presented for the evaluation of worsening left knee pain and swelling. He was unable to bear weight on left leg. MRI concerning for septic arthritis and meniscal tear. He underwent extensive arthroscopic debridement with partial meniscectomy on 11/23. Empirically started on vancomycin and Rocephin. Blood cultures + for MRSA Infectious diseases consulted, antibiotics changed to daptomycin given worsening renal functions.  He underwent repeat arthroscopic irrigation and debridement on 11/27. TEE did not show any endocarditis. Infectious disease recommended daptomycin for 6 weeks.  Last day would be  (November 15, 2022) Patient had a PICC line placed. --plan for skin check with [place] next week    Severe constipation, resolved   Acute systolic heart failure: TTE shows LVEF 35 to 40% with reduced left ventricular function.  Pt received IV  lasix 40 mg for diuresis. Cardiology consulted --cont Entresto and Coreg and escalate GDMT as tolerated. --cont oral lasix 40 mg daily --Plan for right and left heart cath as outpatient after resolution of bacteremia.   Orthostatic hypotension, improved   Uncontrolled type 1 diabetes mellitus with hyperglycemia: Insulin pump is on hold --cont glargine 28u daily --mealtime 6u TID --ACHS and SSI   Acute kidney injury --Cr peaked at 1.67.  AKI since resolved, Cr down to baseline around 1.   Left peroneal vein DVT: Patient noted to have nonocclusive thrombus seen within the left calf peroneal vein. --cont Eliquis   Coronary artery disease, native vessel. Stable.  Continue Lipitor, and aspirin.   Hypophosphatemia: Replaced.      DVT prophylaxis: XF:GHWEXHB Code Status: Full code  Family Communication:  Level of care: Med-Surg Dispo:   The patient is from: home Anticipated d/c is to: SNF rehab Anticipated d/c date is: whenever bed available, likely Monday   Subjective and Interval History:  Pt reported knee pain improved.  Complained of some nausea.   Objective: Vitals:   10/11/22 1738 10/11/22 2348 10/12/22 0833 10/12/22 1535  BP: 111/60 (!) 95/49 136/72 131/62  Pulse: 89 87 86 89  Resp: '18 16 17 18  '$ Temp: 97.9 F (36.6 C) 98.8 F (37.1 C) 97.9 F (36.6 C) 98.1 F (36.7 C)  TempSrc:      SpO2: 97% 94% 96% 97%  Weight:      Height:        Intake/Output Summary (Last 24 hours) at 10/12/2022 2008 Last data filed at 10/12/2022 1138 Gross per 24 hour  Intake 130 ml  Output 800 ml  Net -670 ml   Filed Weights   10/02/22 2229 10/07/22 1448  Weight: 88.5 kg 88.5 kg    Examination:   Constitutional: NAD, AAOx3 HEENT: conjunctivae and lids normal, EOMI CV: No cyanosis.   RESP: normal respiratory effort, on RA Extremities: left knee with ACE wrap SKIN: warm, dry Neuro: II - XII grossly intact.   Psych: Normal mood and affect.  Appropriate judgement and  reason   Data Reviewed: I have personally reviewed labs and imaging studies  Time spent: 35 minutes  Enzo Bi, MD [facility] If 7PM-7AM, please contact night-coverage 10/12/2022, 8:08 PM

## 2022-10-12 NOTE — TOC Progression Note (Signed)
Transition of Care [facility]) - Progression Note    Patient Details  Name: CRIST KRUSZKA MRN: [id number] Date of Birth: 03-29-1946  [facility] of [facility]) CM/SW Contact  Izola Price, RN Phone Number: 10/12/2022, 4:00 PM  Clinical Narrative: 12/2: [facility] accept Monday. Has had BM. Candace Cruise ID [id number]. Simmie Davies RN CM     Expected Discharge Plan: Skilled Nursing Facility Barriers to Discharge: Ship broker  Expected Discharge Plan and Services Expected Discharge Plan: [place] arrangements for the past 2 months: Single Family Home                                       Social Determinants of Health (SDOH) Interventions    Readmission Risk Interventions     No data to display
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
## 2022-10-12 NOTE — Progress Notes (Signed)
Rounding Note    Patient Name: [name] Date of Encounter: 10/12/2022  [place] Cardiologist: Ida Rogue, MD   Subjective   Patient denies chest pain or shortness of breath.  He reports left knee pain.  Trace lower leg edema on exam.  Awaiting placement at SNF.  Inpatient Medications    Scheduled Meds:  apixaban  10 mg Oral BID   Followed by   Derrill Memo ON 10/14/2022] apixaban  5 mg Oral BID   atorvastatin  40 mg Oral Daily   bisacodyl  5 mg Oral BID   carvedilol  6.25 mg Oral BID WC   Chlorhexidine Gluconate Cloth  6 each Topical Daily   cyanocobalamin  1,000 mcg Oral QODAY   docusate sodium  200 mg Oral BID   famotidine  20 mg Oral BID   furosemide  40 mg Oral Daily   insulin aspart  0-20 Units Subcutaneous TID WC   insulin aspart  6 Units Subcutaneous TID WC   insulin glargine-yfgn  28 Units Subcutaneous Daily   multivitamin with minerals  1 tablet Oral Daily   polyethylene glycol-electrolytes  2,000 mL Oral Once   sacubitril-valsartan  1 tablet Oral BID   senna  1 tablet Oral BID   sodium chloride flush  10-40 mL Intracatheter Q12H   Continuous Infusions:  DAPTOmycin (CUBICIN) 700 mg in sodium chloride 0.9 % IVPB 700 mg (10/11/22 2041)   PRN Meds: acetaminophen, bisacodyl, diphenhydrAMINE, hyoscyamine, magnesium hydroxide, metoCLOPramide **OR** metoCLOPramide (REGLAN) injection, morphine injection, ondansetron **OR** ondansetron (ZOFRAN) IV, mouth rinse, oxyCODONE, phenol, sodium chloride flush, sodium phosphate   Vital Signs    Vitals:   10/10/22 2050 10/11/22 0813 10/11/22 1738 10/11/22 2348  BP: (!) 141/68 (!) 140/71 111/60 (!) 95/49  Pulse: 87 90 89 87  Resp: '17 17 18 16  '$ Temp: 98.2 F (36.8 C) (!) 97.5 F (36.4 C) 97.9 F (36.6 C) 98.8 F (37.1 C)  TempSrc: Oral     SpO2: 98% 98% 97% 94%  Weight:      Height:        Intake/Output Summary (Last 24 hours) at 10/12/2022 0704 Last data filed at 10/12/2022 5400 Gross per 24  hour  Intake 130 ml  Output 1800 ml  Net -1670 ml      10/07/2022    2:48 PM 10/02/2022   10:29 PM 08/06/2022    5:44 AM  Last 3 Weights  Weight (lbs) 195 lb 1.7 oz 195 lb 198 lb 8 oz  Weight (kg) 88.5 kg 88.451 kg 90.039 kg      Telemetry    N/A - Personally Reviewed  ECG    No new - Personally Reviewed  Physical Exam   GEN: No acute distress.   Neck: No JVD Cardiac: RRR, no murmurs, rubs, or gallops.  Respiratory: Clear to auscultation bilaterally. GI: Soft, nontender, non-distended  MS: No edema; No deformity. Neuro:  Nonfocal  Psych: Normal affect   Labs    High Sensitivity Troponin:  No results for input(s): "TROPONINIHS" in the last 720 hours.   Chemistry Recent Labs  Lab 10/08/22 0632 10/10/22 0620 10/11/22 0545  NA 141 142 140  K 4.6 3.9 4.2  CL 110 111 107  CO2 20* 27 27  GLUCOSE 367* 207* 218*  BUN 34* 48* 39*  CREATININE 1.06 1.02 0.99  CALCIUM 8.1* 8.1* 8.0*  MG 2.3 2.3 2.1  GFRNONAA >60 >60 >60  ANIONGAP 11 4* 6    Lipids No  results for input(s): "CHOL", "TRIG", "HDL", "LABVLDL", "LDLCALC", "CHOLHDL" in the last 168 hours.  Hematology Recent Labs  Lab 10/10/22 0620 10/11/22 0545 10/12/22 0640  WBC 19.3* 14.7* 13.5*  RBC 3.63* 3.24* 3.19*  HGB 10.8* 9.7* 9.3*  HCT 31.1* 27.9* 28.3*  MCV 85.7 86.1 88.7  MCH 29.8 29.9 29.2  MCHC 34.7 34.8 32.9  RDW 14.6 14.6 14.5  PLT 521* 458* 480*   Thyroid No results for input(s): "TSH", "FREET4" in the last 168 hours.  BNPNo results for input(s): "BNP", "PROBNP" in the last 168 hours.  DDimer No results for input(s): "DDIMER" in the last 168 hours.   Radiology    MR KNEE LEFT W WO CONTRAST  Result Date: 10/10/2022 CLINICAL DATA:  Septic arthritis suspected. Joint effusion and synovial thickening on previous MRI. Patient underwent arthroscopic irrigation and debridement with extensive partial synovectomy and partial medial meniscectomy on 10/03/2022. Subsequent arthroscopic irrigation and  debridement 10/07/2022 with cultures growing MRSA. EXAM: MRI OF THE LEFT KNEE WITHOUT AND WITH CONTRAST TECHNIQUE: Multiplanar, multisequence MR imaging of the left knee was performed both before and after administration of intravenous contrast. CONTRAST:  1m GADAVIST GADOBUTROL 1 MMOL/ML IV SOLN COMPARISON:  MRI 10/03/2022.  Radiographs 10/02/2022. FINDINGS: Bones/Joint/Cartilage Interval increased serpiginous T2 marrow hyperintensity posteriorly in the medial femoral condyle and medial tibial plateau without significant enhancement. This is not typical for osteomyelitis, and could reflect bone infarcts. No cortical destruction or suspicious marrow enhancement identified. There is low-level periarticular edema which is nonspecific. A small amount of air within the knee joint is attributed to the recent surgery. Unchanged moderate size knee joint effusion with diffuse synovial enhancement. Ligaments The cruciate and collateral ligaments remain intact. Postsurgical changes are present within the medial meniscus. Muscles and Tendons Intact extensor mechanism. No intramuscular fluid collection or abnormal enhancement. Soft tissue Prepatellar subcutaneous edema without focal fluid collection. Stable small Baker cyst with associated synovial enhancement. IMPRESSION: 1. Unchanged moderate size knee joint effusion with diffuse synovial enhancement, consistent with known septic arthritis. 2. Interval increased serpiginous T2 hyperintensity posteriorly in the medial femoral condyle and medial tibial plateau without significant enhancement. This is not typical for osteomyelitis, and could reflect bone infarcts. Early osteomyelitis is not completely excluded, and continued follow-up should be considered. 3. No evidence of soft tissue abscess. 4. Postsurgical changes in the medial meniscus. Electronically Signed   By: WRichardean SaleM.D.   On: 10/10/2022 11:23    Cardiac Studies   TEE 09/2022  1. Left ventricular  ejection fraction, by estimation, is 35 to 40%. The  left ventricle has moderately decreased function.   2. Right ventricular systolic function is normal. The right ventricular  size is normal.   3. No left atrial/left atrial appendage thrombus was detected.   4. The mitral valve is normal in structure. No evidence of mitral valve  regurgitation.   5. The aortic valve is tricuspid. Aortic valve regurgitation is not  visualized.   Patient Profile     76y.o. male with a history of CAD/PCI to LAD 2008, diabetes, hypertension presenting with left knee pain, diagnosed with septic arthritis and MRSA bacteremia.  Being seen for reduced ejection fraction, EF35 to 40%.    Assessment & Plan    MRSA bacteremia Septic arthritis - TEE with no endocarditis - PICC line placed - pending nursing home placement   Cardiomyopathy, EF 35-40% - continue Coreg and Entresto 24-'26mg'$ BID - lasix '40mg'$  at discharge - has trace lower leg edema on exam -  plan for R/L heart cath as outpatient   CAD/PCI to LAD in 2008 - no chest pain reported - continue Lipitor and Coreg - No ASA with Eliquis - plan for ischemic work-up as outpatient   Left peronea vein DVT - continue Eliquis '5mg'$  BID per IM  For questions or updates, please contact [place] Please consult [email or web address] for contact info under        Signed, Adante Courington Ninfa Meeker, [name]  10/12/2022, 7:04 AM

## 2022-10-12 NOTE — Plan of Care (Signed)
No new changes in assessment.   Problem: Education: Goal: Knowledge of General Education information will improve Description: Including pain rating scale, medication(s)/side effects and non-pharmacologic comfort measures Outcome: Progressing   Problem: Health Behavior/Discharge Planning: Goal: Ability to manage health-related needs will improve Outcome: Progressing   Problem: Clinical Measurements: Goal: Ability to maintain clinical measurements within normal limits will improve Outcome: Progressing Goal: Will remain free from infection Outcome: Progressing Goal: Diagnostic test results will improve Outcome: Progressing Goal: Respiratory complications will improve Outcome: Progressing Goal: Cardiovascular complication will be avoided Outcome: Progressing   Problem: Activity: Goal: Risk for activity intolerance will decrease Outcome: Progressing   Problem: Nutrition: Goal: Adequate nutrition will be maintained Outcome: Progressing   Problem: Coping: Goal: Level of anxiety will decrease Outcome: Progressing   Problem: Elimination: Goal: Will not experience complications related to bowel motility Outcome: Progressing Goal: Will not experience complications related to urinary retention Outcome: Progressing   Problem: Pain Managment: Goal: General experience of comfort will improve Outcome: Progressing   Problem: Safety: Goal: Ability to remain free from injury will improve Outcome: Progressing   Problem: Skin Integrity: Goal: Risk for impaired skin integrity will decrease Outcome: Progressing   Problem: Education: Goal: Ability to describe self-care measures that may prevent or decrease complications (Diabetes Survival Skills Education) will improve Outcome: Progressing Goal: Individualized Educational Video(s) Outcome: Progressing   Problem: Coping: Goal: Ability to adjust to condition or change in health will improve Outcome: Progressing   Problem: Fluid  Volume: Goal: Ability to maintain a balanced intake and output will improve Outcome: Progressing   Problem: Health Behavior/Discharge Planning: Goal: Ability to identify and utilize available resources and services will improve Outcome: Progressing Goal: Ability to manage health-related needs will improve Outcome: Progressing   Problem: Metabolic: Goal: Ability to maintain appropriate glucose levels will improve Outcome: Progressing   Problem: Nutritional: Goal: Maintenance of adequate nutrition will improve Outcome: Progressing Goal: Progress toward achieving an optimal weight will improve Outcome: Progressing   Problem: Skin Integrity: Goal: Risk for impaired skin integrity will decrease Outcome: Progressing   Problem: Tissue Perfusion: Goal: Adequacy of tissue perfusion will improve Outcome: Progressing

## 2022-10-12 NOTE — Progress Notes (Signed)
Physical Therapy Treatment Patient Details Name: [name] MRN: [id number] DOB: [birth year] Today's Date: 10/12/2022   History of Present Illness Patient is a 76 year old male presented to ER and admitted secondary to progressive L knee pain, s/p arthroscopic debridement, partial menisectomy (10/03/22); hospital stay complicated by uncontrolled DM. Nonocclusive left peroneal DVT with history of left lower extremity DVT. Patient underwent Arthroscopic irrigation and debridement, left knee for septic arthritis on 10/07/22    PT Comments    Pt was long sitting in bed with RN and partner at bedside. Pt is much more alert and engaged today. BP was stable throughout without symptoms of orthostatic hypotension. BP in long sitting 140/67 (89), sitting EOB 136/72(90), standing 117/70( 82). Pt was able to ambulate short distance and is progressing overall. Continued recommendation for DC to SNF to address overall deficits while maximizing independence with ADLs. Acute PT will continue to follow per current POC.   Recommendations for follow up therapy are one component of a multi-disciplinary discharge planning process, led by the attending physician.  Recommendations may be updated based on patient status, additional functional criteria and insurance authorization.  Follow Up Recommendations  Skilled nursing-short term rehab (<3 hours/day) Can patient physically be transported by private vehicle: Yes   Assistance Recommended at Discharge Frequent or constant Supervision/Assistance  Patient can return home with the following A little help with walking and/or transfers;Assistance with cooking/housework;Assist for transportation;Help with stairs or ramp for entrance;A little help with bathing/dressing/bathroom;Direct supervision/assist for medications management   Equipment Recommendations   (defer to next level of care)    Recommendations for Other Services       Precautions / Restrictions  Precautions Precautions: Fall Restrictions Weight Bearing Restrictions: Yes LLE Weight Bearing: Weight bearing as tolerated     Mobility  Bed Mobility Overal bed mobility: Needs Assistance Bed Mobility: Supine to Sit  Supine to sit: Min assist, HOB elevated  General bed mobility comments: increased time required. BP in supine 140/67(89), sitting 136/72 (90) standing 117/70. no symptoms of hypotension/dizziness throughout session today    Transfers Overall transfer level: Needs assistance Equipment used: Rolling walker (2 wheels) Transfers: Sit to/from Stand Sit to Stand: Min assist    General transfer comment: min assist from standard height surfaces, CGA from elevated surface heights    Ambulation/Gait Ambulation/Gait assistance: Min guard Gait Distance (Feet): 40 Feet Assistive device: Rolling walker (2 wheels) Gait Pattern/deviations: Step-through pattern, Antalgic, Trunk flexed Gait velocity: decreased  General Gait Details: pt tolerated ambulation well this date. did limit distance due to pt's resaponse to activity over past few days. He demonstrated improved tolerance to all activity this date.    Balance Overall balance assessment: Needs assistance Sitting-balance support: Feet supported, Bilateral upper extremity supported Sitting balance-Leahy Scale: Good     Standing balance support: Bilateral upper extremity supported, Reliant on assistive device for balance, During functional activity Standing balance-Leahy Scale: Fair Standing balance comment: reliant on RW for dynamic standing activity       Cognition Arousal/Alertness: Awake/alert Behavior During Therapy: WFL for tasks assessed/performed Overall Cognitive Status: Within Functional Limits for tasks assessed    General Comments: pt is much more alert, chipper, and oriented. He is eager to participate. continues to endorse knee and back pain but less than previous few dates               Pertinent  Vitals/Pain Pain Assessment Pain Assessment: 0-10 Pain Score: 5  Pain Location: knee and back Pain Descriptors /  Indicators: Discomfort, Sore Pain Intervention(s): Limited activity within patient's tolerance, Monitored during session, Premedicated before session, Repositioned, Ice applied     PT Goals (current goals can now be found in the care plan section) Acute Rehab PT Goals Patient Stated Goal: rehab then home." Progress towards PT goals: Progressing toward goals    Frequency    7X/week      PT Plan Current plan remains appropriate       AM-PAC PT "6 Clicks" Mobility   Outcome Measure  Help needed turning from your back to your side while in a flat bed without using bedrails?: A Little Help needed moving from lying on your back to sitting on the side of a flat bed without using bedrails?: A Little Help needed moving to and from a bed to a chair (including a wheelchair)?: A Little Help needed standing up from a chair using your arms (e.g., wheelchair or bedside chair)?: A Little Help needed to walk in hospital room?: A Little Help needed climbing 3-5 steps with a railing? : A Lot 6 Click Score: 17    End of Session   Activity Tolerance: Patient tolerated treatment well;Patient limited by fatigue Patient left: in chair;with call bell/phone within reach;with chair alarm set Nurse Communication: Mobility status PT Visit Diagnosis: Muscle weakness (generalized) (M62.81);Difficulty in walking, not elsewhere classified (R26.2);Pain Pain - Right/Left: Left Pain - part of body: Knee     Time: 9244-6286 PT Time Calculation (min) (ACUTE ONLY): 19 min  Charges:  $Gait Training: 8-22 mins                     Julaine Fusi PTA 10/12/22, 9:49 AM

## 2022-10-12 NOTE — Progress Notes (Signed)
Patient resting in bed, transferred from chair back to bed, patient tolerated well. Dressing clean dry and intact on left knee. Call bell in reach, bed alarm on, bed lowest position. Will continue to round on patient per unit routine.

## 2022-10-13 ENCOUNTER — Inpatient Hospital Stay: Payer: Medicare HMO

## 2022-10-13 DIAGNOSIS — I5041 Acute combined systolic (congestive) and diastolic (congestive) heart failure: Secondary | ICD-10-CM

## 2022-10-13 DIAGNOSIS — I25118 Atherosclerotic heart disease of native coronary artery with other forms of angina pectoris: Secondary | ICD-10-CM | POA: Diagnosis not present

## 2022-10-13 DIAGNOSIS — E1065 Type 1 diabetes mellitus with hyperglycemia: Secondary | ICD-10-CM | POA: Diagnosis not present

## 2022-10-13 DIAGNOSIS — M00062 Staphylococcal arthritis, left knee: Secondary | ICD-10-CM | POA: Diagnosis not present

## 2022-10-13 LAB — BASIC METABOLIC PANEL
Anion gap: 5 (ref 5–15)
BUN: 25 mg/dL — ABNORMAL HIGH (ref 8–23)
CO2: 32 mmol/L (ref 22–32)
Calcium: 8.1 mg/dL — ABNORMAL LOW (ref 8.9–10.3)
Chloride: 105 mmol/L (ref 98–111)
Creatinine, Ser: 0.9 mg/dL (ref 0.61–1.24)
GFR, Estimated: >60 mL/min (ref >60)
Glucose, Bld: 87 mg/dL (ref 70–99)
Potassium: 3.8 mmol/L (ref 3.5–5.1)
Sodium: 142 mmol/L (ref 135–145)

## 2022-10-13 LAB — GLUCOSE, CAPILLARY
Glucose-Capillary: 85 mg/dL (ref 70–99)
Glucose-Capillary: 222 mg/dL — ABNORMAL HIGH (ref 70–99)
Glucose-Capillary: 196 mg/dL — ABNORMAL HIGH (ref 70–99)
Glucose-Capillary: 277 mg/dL — ABNORMAL HIGH (ref 70–99)

## 2022-10-13 LAB — CBC
HCT: 28.2 % — ABNORMAL LOW (ref 39.0–52.0)
Hemoglobin: 9.2 g/dL — ABNORMAL LOW (ref 13.0–17.0)
MCH: 29.4 pg (ref 26.0–34.0)
MCHC: 32.6 g/dL (ref 30.0–36.0)
MCV: 90.1 fL (ref 80.0–100.0)
Platelets: 489 10*3/uL — ABNORMAL HIGH (ref 150–400)
RBC: 3.13 MIL/uL — ABNORMAL LOW (ref 4.22–5.81)
RDW: 14.7 % (ref 11.5–15.5)
WBC: 13.6 10*3/uL — ABNORMAL HIGH (ref 4.0–10.5)
nRBC: 0 % (ref 0.0–0.2)

## 2022-10-13 LAB — MAGNESIUM: Magnesium: 2.1 mg/dL (ref 1.7–2.4)

## 2022-10-13 LAB — BRAIN NATRIURETIC PEPTIDE: B Natriuretic Peptide: 326 pg/mL — ABNORMAL HIGH (ref 0.0–100.0)

## 2022-10-13 MED ORDER — INSULIN GLARGINE-YFGN 100 UNIT/ML ~~LOC~~ SOLN
14.0000 [IU] | Freq: Once | SUBCUTANEOUS | Status: DC
  Filled 2022-10-13: qty 0.14
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
## 2022-10-13 NOTE — Assessment & Plan Note (Signed)
Due to septic left knee.  IV daptomycin for 6 weeks

## 2022-10-13 NOTE — Assessment & Plan Note (Deleted)
Acute systolic CHF.  2D echo shows EF of 35 to 40% with reduced LV systolic function. Continue Entresto, Coreg, Lasix .  Outpatient cardiac cath after resolution of bacteremia per cardiology

## 2022-10-13 NOTE — Plan of Care (Signed)

## 2022-10-13 NOTE — Assessment & Plan Note (Signed)
2D echo shows EF of 35 to 40% with reduced LV systolic function. Continue Entresto, Coreg, Lasix Outpatient cardiac cath after resolution of bacteremia per cardiology

## 2022-10-13 NOTE — Progress Notes (Signed)
Physical Therapy Treatment Patient Details Name: [name] MRN: [id number] DOB: August 16, 1946 Today's Date: 10/13/2022   History of Present Illness Patient is a 76 year old male presented to ER and admitted secondary to progressive L knee pain, s/p arthroscopic debridement, partial menisectomy (10/03/22); hospital stay complicated by uncontrolled DM. Nonocclusive left peroneal DVT with history of left lower extremity DVT. Patient underwent Arthroscopic irrigation and debridement, left knee for septic arthritis on 10/07/22    PT Comments    Pt back from [place].  Discussed with C. Gains PA and cleared to continue with mobility.  Pt is able to get to EOB, stand for orthostatic BP's and transition to chair with RW and min guard.  Pt remains orthostatic with BP's 117/55 decreasing to 86/56 in standing.  Full set documented in flow sheets.  He reports feeling "fuzzy" 2/10 and reports feeling a bit confused and stated he does feel like he gets better with time but continues to rate at 2/10.  Pt does stop and widen his eyes several times during standing.  Further gait is deferred for pt and staff safety and BP does recover in sitting.  Several sets of seated AROM BLE until fatigued.  He does remain in chair after session with needs met.  Communicated with RN and MD.   Recommendations for follow up therapy are one component of a multi-disciplinary discharge planning process, led by the attending physician.  Recommendations may be updated based on patient status, additional functional criteria and insurance authorization.  Follow Up Recommendations  Skilled nursing-short term rehab (<3 hours/day)     Assistance Recommended at Discharge Frequent or constant Supervision/Assistance  Patient can return home with the following A little help with walking and/or transfers;Assistance with cooking/housework;Assist for transportation;Help with stairs or ramp for entrance;A little help with  bathing/dressing/bathroom;Direct supervision/assist for medications management   Equipment Recommendations       Recommendations for Other Services       Precautions / Restrictions       Mobility  Bed Mobility Overal bed mobility: Needs Assistance Bed Mobility: Supine to Sit     Supine to sit: Min guard          Transfers Overall transfer level: Needs assistance Equipment used: Rolling walker (2 wheels) Transfers: Sit to/from Stand Sit to Stand: Min assist, Min guard           General transfer comment: min assist from standard height surfaces, CGA from elevated surface heights    Ambulation/Gait Ambulation/Gait assistance: Min guard Gait Distance (Feet): 3 Feet Assistive device: Rolling walker (2 wheels) Gait Pattern/deviations: Step-through pattern, Antalgic, Trunk flexed Gait velocity: decreased     General Gait Details: stepped to chair limited by dizziness 2/10 and orthostatic BP's   Stairs             Wheelchair Mobility    Modified Rankin (Stroke Patients Only)       Balance Overall balance assessment: Needs assistance Sitting-balance support: Feet supported, Bilateral upper extremity supported Sitting balance-Leahy Scale: Good     Standing balance support: Bilateral upper extremity supported, Reliant on assistive device for balance, During functional activity Standing balance-Leahy Scale: Fair Standing balance comment: reliant on RW for dynamic standing activity                            Cognition Arousal/Alertness: Awake/alert Behavior During Therapy: WFL for tasks assessed/performed Overall Cognitive Status: Within Functional Limits for tasks assessed  Exercises Other Exercises Other Exercises: seated AROM 3 x 10    General Comments        Pertinent Vitals/Pain Pain Assessment Pain Assessment: Faces Faces Pain Scale: Hurts a little bit Pain  Location: knee and back Pain Descriptors / Indicators: Discomfort, Sore Pain Intervention(s): Limited activity within patient's tolerance, Monitored during session, Repositioned    Home Living                          Prior Function            PT Goals (current goals can now be found in the care plan section) Progress towards PT goals: Progressing toward goals    Frequency    7X/week      PT Plan Current plan remains appropriate    Co-evaluation              AM-PAC PT "6 Clicks" Mobility   Outcome Measure  Help needed turning from your back to your side while in a flat bed without using bedrails?: A Little Help needed moving from lying on your back to sitting on the side of a flat bed without using bedrails?: A Little Help needed moving to and from a bed to a chair (including a wheelchair)?: A Little Help needed standing up from a chair using your arms (e.g., wheelchair or bedside chair)?: A Little Help needed to walk in hospital room?: A Little Help needed climbing 3-5 steps with a railing? : A Lot 6 Click Score: 17    End of Session Equipment Utilized During Treatment: Gait belt Activity Tolerance: Patient tolerated treatment well;Patient limited by fatigue Patient left: in chair;with call bell/phone within reach;with chair alarm set Nurse Communication: Mobility status;Other (comment) (BP's) PT Visit Diagnosis: Muscle weakness (generalized) (M62.81);Difficulty in walking, not elsewhere classified (R26.2);Pain Pain - Right/Left: Left Pain - part of body: Knee     Time: 9753-0051 PT Time Calculation (min) (ACUTE ONLY): 24 min  Charges:  $Therapeutic Exercise: 8-22 mins $Therapeutic Activity: 8-22 mins                   Chesley Noon, PTA 10/13/22, 2:25 PM

## 2022-10-13 NOTE — Progress Notes (Signed)
Progress Note   Patient: [name] BZJ:[id number] DOB: 1945-12-17 DOA: 10/02/2022     10 DOS: the patient was seen and examined on 10/13/2022   Brief hospital course: This 76 years old male with PMH significant for type 1 diabetes on insulin pump, history of left leg DVT, coronary artery disease s/p stent angioplasty, GERD, history of bladder cancer, history of ureteral stricture s/p dilatation, prior history of MRSA infection presented in the ED for the evaluation of left knee pain  for last 6 days resulting in an inability to bear weight or bend his left knee. Patient had an MRI of left knee which shows moderate knee joint effusion with synovial thickening suggesting synovitis. He is also found to have complex tear of posterior horn of medial meniscus. Orthopedics was consulted and patient was taken to the OR for arthroscopy and washout of left knee.  Tolerated well. Blood cultures + for MRSA, underwent TEE negative for vegetation, needs 6 weeks of iv antibiotics, PICC line placed.   12/3: LLE Doppler negative for DVT  Assessment and Plan: * Uncontrolled type 1 diabetes mellitus with hyperglycemia, with long-term current use of insulin ([name]) Most likely related to systemic steroid injection the patient received for left knee pain Hold insulin pump for now Continue insulin Semglee 28 units subcu daily and NovoLog 6 units 3 times daily with each meals Sliding scale coverage for glycemic control Maintain consistent carbohydrate diet  AKI (acute kidney injury) ([place]) Patient has a baseline serum creatinine of 1.0 and today on admission it is 1.38 Unclear etiology for worsening renal function and may be related to recent NSAID use Now resolved.   Lab Results  Component Value Date   CREATININE 0.90 10/13/2022   CREATININE 0.93 10/12/2022   CREATININE 0.99 10/11/2022     Septic arthritis of knee, left [facility]) Patient presented with worsening left leg pain and swelling and is unable  to bear weight on his left leg Noted to have marked leukocytosis which may be related to systemic steroid administration MRI concerning for septic arthritis Status post irrigation debridement and partial meniscectomy of left septic knee on 11/23 -repeat arthroscopic irrigation debridement on 11/27 POD 6 Will need IV antibiotics/daptomycin for 6-week with a last day on January 03/2023 per ID.  PICC line placed   DVT (deep venous thrombosis) ([place]) Patient noted to have nonocclusive thrombus seen within the left calf peroneal vein. continue apixaban  Acute combined systolic and diastolic heart failure ([place]) 2D echo shows EF of 35 to 40% with reduced LV systolic function. Continue Entresto, Coreg, Lasix Outpatient cardiac cath after resolution of bacteremia per cardiology  MRSA bacteremia Due to septic left knee.  IV daptomycin for 6 weeks  CAD, NATIVE VESSEL Continue carvedilol, atorvastatin and aspirin.  HYPERTENSION, BENIGN Continue carvedilol        Subjective: Feeling better.  Underwent left lower extremity Doppler to rule out DVT earlier.  Physical Exam: Vitals:   10/12/22 0833 10/12/22 1535 10/13/22 0031 10/13/22 0858  BP: 136/72 131/62 (!) 118/57 110/85  Pulse: 86 89 87 71  Resp: '17 18 18 17  '$ Temp: 97.9 F (36.6 C) 98.1 F (36.7 C) 98.3 F (36.8 C) 97.7 F (36.5 C)  TempSrc:      SpO2: 96% 97% 92% 96%  Weight:      Height:       Constitutional: NAD, AAOx3 HEENT: conjunctivae and lids normal, EOMI CV: No cyanosis.   RESP: normal respiratory effort, on RA Extremities: left knee  with ACE wrap.  Minimal swelling to the lower leg SKIN: warm, dry Neuro: II - XII grossly intact.   Psych: Normal mood and affect.  Appropriate judgement and reason Data Reviewed:  Left lower extremity Doppler negative for DVT  Family Communication: None  Disposition: Status is: Inpatient Remains inpatient appropriate because: Medically stable, waiting for placement.  Hopefully  tomorrow discharged to [employer] Discharge Destination: Skilled nursing facility   DVT prophylaxis-Eliquis Time spent: 35 minutes  Author: Max Sane, MD 10/13/2022 2:01 PM  For on call review [email or web address].

## 2022-10-14 ENCOUNTER — Telehealth: Payer: Self-pay | Admitting: Cardiovascular Disease

## 2022-10-14 DIAGNOSIS — I5041 Acute combined systolic (congestive) and diastolic (congestive) heart failure: Secondary | ICD-10-CM | POA: Diagnosis not present

## 2022-10-14 DIAGNOSIS — J101 Influenza due to other identified influenza virus with other respiratory manifestations: Secondary | ICD-10-CM | POA: Diagnosis not present

## 2022-10-14 DIAGNOSIS — Z87891 Personal history of nicotine dependence: Secondary | ICD-10-CM | POA: Diagnosis not present

## 2022-10-14 DIAGNOSIS — M7122 Synovial cyst of popliteal space [Baker], left knee: Secondary | ICD-10-CM | POA: Diagnosis not present

## 2022-10-14 DIAGNOSIS — N179 Acute kidney failure, unspecified: Secondary | ICD-10-CM | POA: Diagnosis not present

## 2022-10-14 DIAGNOSIS — I5022 Chronic systolic (congestive) heart failure: Secondary | ICD-10-CM | POA: Diagnosis not present

## 2022-10-14 DIAGNOSIS — I10 Essential (primary) hypertension: Secondary | ICD-10-CM | POA: Diagnosis not present

## 2022-10-14 DIAGNOSIS — E1065 Type 1 diabetes mellitus with hyperglycemia: Secondary | ICD-10-CM | POA: Diagnosis not present

## 2022-10-14 DIAGNOSIS — E663 Overweight: Secondary | ICD-10-CM | POA: Diagnosis not present

## 2022-10-14 DIAGNOSIS — I509 Heart failure, unspecified: Secondary | ICD-10-CM | POA: Diagnosis not present

## 2022-10-14 DIAGNOSIS — Z1639 Resistance to other specified antimicrobial drug: Secondary | ICD-10-CM | POA: Diagnosis present

## 2022-10-14 DIAGNOSIS — M009 Pyogenic arthritis, unspecified: Secondary | ICD-10-CM | POA: Diagnosis not present

## 2022-10-14 DIAGNOSIS — B9562 Methicillin resistant Staphylococcus aureus infection as the cause of diseases classified elsewhere: Secondary | ICD-10-CM | POA: Diagnosis not present

## 2022-10-14 DIAGNOSIS — S83232D Complex tear of medial meniscus, current injury, left knee, subsequent encounter: Secondary | ICD-10-CM | POA: Diagnosis not present

## 2022-10-14 DIAGNOSIS — E10649 Type 1 diabetes mellitus with hypoglycemia without coma: Secondary | ICD-10-CM | POA: Diagnosis not present

## 2022-10-14 DIAGNOSIS — D649 Anemia, unspecified: Secondary | ICD-10-CM | POA: Diagnosis present

## 2022-10-14 DIAGNOSIS — Z79899 Other long term (current) drug therapy: Secondary | ICD-10-CM | POA: Diagnosis not present

## 2022-10-14 DIAGNOSIS — M00862 Arthritis due to other bacteria, left knee: Secondary | ICD-10-CM | POA: Diagnosis not present

## 2022-10-14 DIAGNOSIS — I110 Hypertensive heart disease with heart failure: Secondary | ICD-10-CM | POA: Diagnosis not present

## 2022-10-14 DIAGNOSIS — E1051 Type 1 diabetes mellitus with diabetic peripheral angiopathy without gangrene: Secondary | ICD-10-CM | POA: Diagnosis not present

## 2022-10-14 DIAGNOSIS — I252 Old myocardial infarction: Secondary | ICD-10-CM | POA: Diagnosis not present

## 2022-10-14 DIAGNOSIS — I824Z2 Acute embolism and thrombosis of unspecified deep veins of left distal lower extremity: Secondary | ICD-10-CM | POA: Diagnosis not present

## 2022-10-14 DIAGNOSIS — E785 Hyperlipidemia, unspecified: Secondary | ICD-10-CM | POA: Diagnosis present

## 2022-10-14 DIAGNOSIS — Z1152 Encounter for screening for COVID-19: Secondary | ICD-10-CM | POA: Diagnosis not present

## 2022-10-14 DIAGNOSIS — Z8551 Personal history of malignant neoplasm of bladder: Secondary | ICD-10-CM | POA: Diagnosis not present

## 2022-10-14 DIAGNOSIS — Z792 Long term (current) use of antibiotics: Secondary | ICD-10-CM | POA: Diagnosis not present

## 2022-10-14 DIAGNOSIS — I82452 Acute embolism and thrombosis of left peroneal vein: Secondary | ICD-10-CM | POA: Diagnosis not present

## 2022-10-14 DIAGNOSIS — K5909 Other constipation: Secondary | ICD-10-CM | POA: Diagnosis not present

## 2022-10-14 DIAGNOSIS — E782 Mixed hyperlipidemia: Secondary | ICD-10-CM | POA: Diagnosis not present

## 2022-10-14 DIAGNOSIS — N390 Urinary tract infection, site not specified: Secondary | ICD-10-CM | POA: Diagnosis not present

## 2022-10-14 DIAGNOSIS — Z85828 Personal history of other malignant neoplasm of skin: Secondary | ICD-10-CM | POA: Diagnosis not present

## 2022-10-14 DIAGNOSIS — J9601 Acute respiratory failure with hypoxia: Secondary | ICD-10-CM | POA: Diagnosis not present

## 2022-10-14 DIAGNOSIS — E783 Hyperchylomicronemia: Secondary | ICD-10-CM | POA: Diagnosis not present

## 2022-10-14 DIAGNOSIS — I2510 Atherosclerotic heart disease of native coronary artery without angina pectoris: Secondary | ICD-10-CM | POA: Diagnosis not present

## 2022-10-14 DIAGNOSIS — R7881 Bacteremia: Secondary | ICD-10-CM | POA: Diagnosis not present

## 2022-10-14 DIAGNOSIS — B9561 Methicillin susceptible Staphylococcus aureus infection as the cause of diseases classified elsewhere: Secondary | ICD-10-CM | POA: Diagnosis not present

## 2022-10-14 DIAGNOSIS — E109 Type 1 diabetes mellitus without complications: Secondary | ICD-10-CM | POA: Diagnosis not present

## 2022-10-14 DIAGNOSIS — Z87440 Personal history of urinary (tract) infections: Secondary | ICD-10-CM | POA: Diagnosis not present

## 2022-10-14 DIAGNOSIS — E1029 Type 1 diabetes mellitus with other diabetic kidney complication: Secondary | ICD-10-CM | POA: Diagnosis not present

## 2022-10-14 DIAGNOSIS — Z7901 Long term (current) use of anticoagulants: Secondary | ICD-10-CM | POA: Diagnosis not present

## 2022-10-14 DIAGNOSIS — Z452 Encounter for adjustment and management of vascular access device: Secondary | ICD-10-CM | POA: Diagnosis not present

## 2022-10-14 DIAGNOSIS — M00062 Staphylococcal arthritis, left knee: Secondary | ICD-10-CM | POA: Diagnosis not present

## 2022-10-14 DIAGNOSIS — K219 Gastro-esophageal reflux disease without esophagitis: Secondary | ICD-10-CM | POA: Diagnosis not present

## 2022-10-14 DIAGNOSIS — Z6828 Body mass index (BMI) 28.0-28.9, adult: Secondary | ICD-10-CM | POA: Diagnosis not present

## 2022-10-14 DIAGNOSIS — R51 Headache with orthostatic component, not elsewhere classified: Secondary | ICD-10-CM | POA: Diagnosis not present

## 2022-10-14 DIAGNOSIS — J439 Emphysema, unspecified: Secondary | ICD-10-CM | POA: Diagnosis present

## 2022-10-14 DIAGNOSIS — Z794 Long term (current) use of insulin: Secondary | ICD-10-CM | POA: Diagnosis not present

## 2022-10-14 LAB — GLUCOSE, CAPILLARY
Glucose-Capillary: 218 mg/dL — ABNORMAL HIGH (ref 70–99)
Glucose-Capillary: 317 mg/dL — ABNORMAL HIGH (ref 70–99)
Glucose-Capillary: 209 mg/dL — ABNORMAL HIGH (ref 70–99)

## 2022-10-14 LAB — BASIC METABOLIC PANEL
Anion gap: 4 — ABNORMAL LOW (ref 5–15)
BUN: 23 mg/dL (ref 8–23)
CO2: 34 mmol/L — ABNORMAL HIGH (ref 22–32)
Calcium: 7.9 mg/dL — ABNORMAL LOW (ref 8.9–10.3)
Chloride: 100 mmol/L (ref 98–111)
Creatinine, Ser: 0.93 mg/dL (ref 0.61–1.24)
GFR, Estimated: >60 mL/min (ref >60)
Glucose, Bld: 329 mg/dL — ABNORMAL HIGH (ref 70–99)
Potassium: 4.5 mmol/L (ref 3.5–5.1)
Sodium: 138 mmol/L (ref 135–145)

## 2022-10-14 LAB — CBC
HCT: 27.9 % — ABNORMAL LOW (ref 39.0–52.0)
Hemoglobin: 9.2 g/dL — ABNORMAL LOW (ref 13.0–17.0)
MCH: 29.9 pg (ref 26.0–34.0)
MCHC: 33 g/dL (ref 30.0–36.0)
MCV: 90.6 fL (ref 80.0–100.0)
Platelets: 465 10*3/uL — ABNORMAL HIGH (ref 150–400)
RBC: 3.08 MIL/uL — ABNORMAL LOW (ref 4.22–5.81)
RDW: 14.8 % (ref 11.5–15.5)
WBC: 11.6 10*3/uL — ABNORMAL HIGH (ref 4.0–10.5)
nRBC: 0 % (ref 0.0–0.2)

## 2022-10-14 LAB — MAGNESIUM: Magnesium: 2.1 mg/dL (ref 1.7–2.4)

## 2022-10-14 MED ORDER — SACUBITRIL-VALSARTAN 24-26 MG PO TABS: 1.0000 | ORAL_TABLET | Freq: Two times a day (BID) | ORAL | 0 refills | Status: DC

## 2022-10-14 MED ORDER — DAPTOMYCIN IV (FOR PTA / DISCHARGE USE ONLY): 700.0000 mg | INTRAVENOUS | 0 refills | Status: DC

## 2022-10-14 MED ORDER — ATORVASTATIN CALCIUM 40 MG PO TABS: 40.0000 mg | ORAL_TABLET | Freq: Every day | ORAL | Status: DC

## 2022-10-14 MED ORDER — INSULIN ASPART 100 UNIT/ML IJ SOLN: 6.0000 [IU] | Freq: Three times a day (TID) | INTRAMUSCULAR | 11 refills | Status: AC

## 2022-10-14 MED ORDER — INSULIN ASPART 100 UNIT/ML IJ SOLN: 0.0000 [IU] | Freq: Three times a day (TID) | INTRAMUSCULAR | 11 refills | Status: AC

## 2022-10-14 MED ORDER — INSULIN ASPART 100 UNIT/ML IJ SOLN: 0.0000 [IU] | Freq: Three times a day (TID) | INTRAMUSCULAR | 11 refills | Status: DC

## 2022-10-14 MED ORDER — FUROSEMIDE 40 MG PO TABS: 40.0000 mg | ORAL_TABLET | Freq: Every day | ORAL | Status: DC

## 2022-10-14 MED ORDER — INSULIN GLARGINE-YFGN 100 UNIT/ML ~~LOC~~ SOLN: 28.0000 [IU] | Freq: Every day | SUBCUTANEOUS | 11 refills | Status: DC
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
## 2022-10-14 NOTE — Plan of Care (Signed)

## 2022-10-14 NOTE — Telephone Encounter (Signed)
LMOV to schedule  

## 2022-10-14 NOTE — Care Management Important Message (Signed)
Important Message  Patient Details  Name: [name] MRN: [id number] Date of Birth: Oct 28, 1946   Medicare Important Message Given:  Yes     Juliann Pulse A [name] 10/14/2022, 1:10 PM

## 2022-10-14 NOTE — Progress Notes (Signed)
Pt discharged to [facility] with PICC in place for 6 weeks of antibiotics

## 2022-10-14 NOTE — Inpatient Diabetes Management (Signed)
Inpatient Diabetes Program Recommendations  AACE/ADA: New Consensus Statement on Inpatient Glycemic Control   Target Ranges:  Prepandial:   less than 140 mg/dL      Peak postprandial:   less than 180 mg/dL (1-2 hours)      Critically ill patients:  140 - 180 mg/dL    Latest Reference Range & Units 10/13/22 08:12 10/13/22 12:48 10/13/22 16:30 10/13/22 21:38 10/14/22 08:13  Glucose-Capillary 70 - 99 mg/dL 85 222 (H)  Novolog 7 units 196 (H)  Novolog 4 units 277 (H) 317 (H)   Review of Glycemic Control  Diabetes history: DM1 Outpatient Diabetes medications: T-Slim Insulin Pump with Dexcom Basal Correction Carb ratio Target BG 12 AM 0.85 unit/hr 40 1:20 120 4 AM 0.75 unit/hr 40 1:13 120 7:30 AM 1.1 unit/hr 40 1:13 120 4 PM 1.2 unit/hr 40 1:18 120 24-hr basal = 25 units Duration of insulin = 5 hrs   Current orders for Inpatient glycemic control: Semglee 28 units daily, Novolog 0-20 units TID with meals, Novolog 6 units TID with meals  Inpatient Diabetes Program Recommendations:    Insulin: Per chart, patient refused Semglee on 10/13/22 and fasting glucose 317 mg/dl this morning. Please encourage patient to take insulin as ordered.  Thanks, Barnie Alderman, RN, MSN, [place] Diabetes Coordinator Inpatient Diabetes Program [phone] (Team Pager from 8am to [place])

## 2022-10-14 NOTE — Telephone Encounter (Signed)
-----   Message from Wellington Hampshire, MD sent at 10/11/2022  2:40 PM EST ----- The patient will be discharged from [facility] in the next 1 to 2 days.  Recommend a follow-up appointment with Dr. Rockey Situ or APP in 2 weeks.

## 2022-10-14 NOTE — Discharge Summary (Addendum)
Physician Discharge Summary   Patient: [name] MRN: [id number] DOB: 09/05/1946  Admit date:     10/02/2022  Discharge date: 10/14/22  Discharge Physician: Max Sane   PCP: [name], MD   Recommendations at discharge:    Continue IV Abx per ID via PICC Line.  F/up for skin check with Fellows next week  Discharge Diagnoses: Principal Problem:   Uncontrolled type 1 diabetes mellitus with hyperglycemia, with long-term current use of insulin ([name]) Active Problems:   Septic arthritis of knee, left ([name])   AKI (acute kidney injury) ([place])   DVT (deep venous thrombosis) ([name])   HYPERTENSION, BENIGN   CAD, NATIVE VESSEL   Essential hypertension   MRSA bacteremia   CAD in native artery   Reactive thrombocytosis   Orthostatic hypotension   Acute combined systolic and diastolic heart failure [facility])  Hospital Course: This 76 years old male with PMH significant for type 1 diabetes on insulin pump, history of left leg DVT, coronary artery disease s/p stent angioplasty, GERD, history of bladder cancer, history of ureteral stricture s/p dilatation, prior history of MRSA infection presented in the ED for the evaluation of left knee pain  for last 6 days resulting in an inability to bear weight or bend his left knee. Patient had an MRI of left knee which shows moderate knee joint effusion with synovial thickening suggesting synovitis. He is also found to have complex tear of posterior horn of medial meniscus. Orthopedics was consulted and patient was taken to the OR for arthroscopy and washout of left knee.  Tolerated well. Blood cultures + for MRSA, underwent TEE negative for vegetation, needs 6 weeks of iv antibiotics, PICC line placed.   12/3: LLE Doppler negative for DVT  Assessment and Plan: * Uncontrolled type 1 diabetes mellitus with hyperglycemia, with long-term current use of insulin ([name]) Resume insulin pump at DC  AKI (acute kidney injury) [facility]) Patient has a  baseline serum creatinine of 1.0 and on admission it is 1.38 Now resolved.  Septic arthritis of knee, left [facility]) Patient presented with worsening left leg pain and swelling and is unable to bear weight on his left leg Noted to have marked leukocytosis  MRI concerning for septic arthritis Status post irrigation debridement and partial meniscectomy of left septic knee on 11/23 -repeat arthroscopic irrigation debridement on 11/27 POD 7 Will need IV antibiotics/daptomycin   Daptomycin 72m/kg body weight- 704mIV PB every 24 hours for 6 weeks Duration: 6 weeks End Date:11/15/22     PICC Care Per Protocol:   Labs weekly while on IV antibiotics: _X_ CBC with differential   _X_ CMP _X_ CRP _X_ ESR _X_ CK    _X_ Please pull PICC at completion of IV antibiotics   DVT (deep venous thrombosis) ([place] noted to have nonocclusive thrombus seen within the left calf peroneal vein. continue apixaban  Acute combined systolic and diastolic heart failure [place] shows EF of 35 to 40% with reduced LV systolic function. Continue Entresto, Coreg, Lasix Outpatient cardiac cath after resolution of bacteremia per cardiology  MRSA bacteremia Due to septic left knee.  IV daptomycin for 6 weeks  CAD, NATIVE VESSEL Continue carvedilol, atorvastatin and aspirin.  HYPERTENSION, BENIGN Continue carvedilol         Consultants: Cardio, ID, Ortho Procedures performed: Status post irrigation debridement and partial meniscectomy of left septic knee on 11/23 -repeat arthroscopic irrigation debridement on 11/27  Disposition: Skilled nursing facility Diet recommendation:  Discharge Diet Orders (  From admission, onward)     Start     Ordered   10/14/22 0000  Diet - low sodium heart healthy        10/14/22 1300           Cardiac diet DISCHARGE MEDICATION: Allergies as of 10/14/2022   No Known Allergies      Medication List     STOP taking these medications    famotidine  20 MG tablet Commonly known as: PEPCID   Gemtesa 75 MG Tabs Generic drug: Vibegron   insulin pump Soln   losartan 50 MG tablet Commonly known as: COZAAR   NovoLOG 100 UNIT/ML injection Generic drug: insulin aspart Replaced by: insulin aspart 100 UNIT/ML injection       TAKE these medications    Apixaban Starter Pack (51m and 531m Commonly known as: ELIQUIS STARTER PACK Take as directed on package: start with two-66m64mablets twice daily for 7 days. On day 8, switch to one-66mg59mblet twice daily.   aspirin EC 81 MG tablet Take 81 mg by mouth daily.   atorvastatin 40 MG tablet Commonly known as: LIPITOR Take 1 tablet (40 mg total) by mouth daily. Start taking on: October 15, 2022 What changed:  medication strength how much to take additional instructions   carvedilol 6.25 MG tablet Commonly known as: COREG TAKE 1 TABLET BY MOUTH TWICE DAILY WITH MEAL What changed:  how much to take how to take this when to take this   cyanocobalamin 1000 MCG tablet Commonly known as: VITAMIN B12 Take 1,000 mcg by mouth every other day.   daptomycin  IVPB Commonly known as: CUBICIN Inject 700 mg into the vein daily. Daptomycin 700mg69mB in 50ml 466m.9% NaCl Indication:  MRSA bacteremia and knee septic arthritis Last Day of Therapy:  11/15/2022 Labs - Once weekly:  CBC/D, CMP, CPK, ESR and CRP Please pull PIC at completion of IV antibiotics Fax weekly lab results  promptly to [phone] Method of administration: IVPB Can adjust method of administration as per pharmacist's discretion to coincide with facility's preferred method of administration   docusate sodium 100 MG capsule Commonly known as: COLACE Take 2 capsules (200 mg total) by mouth 2 (two) times daily. What changed:  how much to take when to take this reasons to take this   furosemide 40 MG tablet Commonly known as: LASIX Take 1 tablet (40 mg total) by mouth daily. Start taking on: October 15, 2022    glucagon 1 MG injection Inject 1 mg into the skin once as needed (for low blood sugar).   Hyoscyamine Sulfate SL 0.125 MG Subl Commonly known as: Levsin/SL Place 0.125-0.25 mg under the tongue every 4 (four) hours as needed (bladder spasms). 1-2 TABS   insulin aspart 100 UNIT/ML injection Commonly known as: novoLOG Inject 6 Units into the skin 3 (three) times daily with meals. Replaces: NovoLOG 100 UNIT/ML injection   insulin aspart 100 UNIT/ML injection Commonly known as: novoLOG Inject 0-20 Units into the skin 3 (three) times daily with meals. CBG < 70:Implement Hypoglycemia Standing Orders and refer to Hypoglycemia Standing Orders sidebar report CBG 70 - 120: 0 units  CBG 121 - 150: 3 units  CBG 151 - 200: 4 units  CBG 201 - 250: 7 units  CBG 251 - 300: 11 units  CBG 301 - 350: 15 units  CBG 351 - 400: 20 units  CBG > 400 call MD and obtain STAT lab verification   insulin glargine-yfgn 100  UNIT/ML injection Commonly known as: SEMGLEE Inject 0.28 mLs (28 Units total) into the skin daily. Start taking on: October 15, 2022   meloxicam 15 MG tablet Commonly known as: MOBIC Take 15 mg by mouth daily.   multivitamin with minerals Tabs tablet Take 1 tablet by mouth daily.   naproxen sodium 220 MG tablet Commonly known as: ALEVE Take 220-440 mg by mouth 2 (two) times daily as needed (pain/headaches.).   omeprazole 20 MG capsule Commonly known as: PRILOSEC Take 20 mg by mouth 2 (two) times daily before a meal.   ondansetron 4 MG disintegrating tablet Commonly known as: ZOFRAN-ODT Take 1 tablet (4 mg total) by mouth every 8 (eight) hours as needed for nausea or vomiting.   oxyCODONE 5 MG immediate release tablet Commonly known as: Oxy IR/ROXICODONE Take 1-2 tablets (5-10 mg total) by mouth every 4 (four) hours as needed for moderate pain (pain score 4-6).   sacubitril-valsartan 24-26 MG Commonly known as: ENTRESTO Take 1 tablet by mouth 2 (two) times daily.   Uribel  118 MG Caps Take 1 capsule (118 mg total) by mouth every 6 (six) hours as needed (dysuria).               Home Infusion Instuctions  (From admission, onward)           Start     Ordered   10/14/22 0000  Home infusion instructions       Question:  Instructions  Answer:  Flushing of vascular access device: 0.9% NaCl pre/post medication administration and prn patency; Heparin 100 u/ml, 1m for implanted ports and Heparin 10u/ml, 560mfor all other central venous catheters.   10/14/22 1259            Contact information for follow-up providers     McLattie CornsPA-C Follow up.   Specialty: Physician Assistant Why: Next week for skin check. Contact information: [phone]            Contact information for after-discharge care     [facility] .   Service: Skilled Nursing Contact information: [phone]                  Discharge Exam: FiDanley Dankereights   10/02/22 2229 10/07/22 1448  Weight: 88.5 kg 88.5 kg   Constitutional: NAD, AAOx3 HEENT: conjunctivae and lids normal, EOMI CV: No cyanosis.   RESP: normal respiratory effort, on RA Extremities: left knee with ACE wrap.  Minimal swelling to the lower leg SKIN: warm, dry Neuro: II - XII grossly intact.   Psych: Normal mood and affect.  Appropriate judgement and reason  Condition at discharge: fair  The results of significant diagnostics from this hospitalization (including imaging, microbiology, ancillary and laboratory) are listed below for reference.   Imaging Studies: [place]enous Img Lower Unilateral Left (DVT)  Result Date: 10/13/2022 CLINICAL DATA:  Left lower extremity edema EXAM: LEFT LOWER EXTREMITY VENOUS DOPPLER ULTRASOUND TECHNIQUE: Gray-scale sonography with  compression, as well as color and duplex ultrasound, were performed to evaluate the deep venous system(s) from the level of the common femoral vein through the popliteal and proximal calf veins. COMPARISON:  None Available. FINDINGS: VENOUS Normal compressibility of the common femoral, superficial femoral, as well as the visualized calf veins. Visualized portions of profunda femoral  vein and great saphenous vein unremarkable. No filling defects to suggest DVT on grayscale or color Doppler imaging. Doppler waveforms show normal direction of venous flow, normal respiratory plasticity and response to augmentation. Limited views of the contralateral common femoral vein are unremarkable. OTHER None. Limitations: Unable to visualize the popliteal vein secondary to bandages which could not be removed. IMPRESSION: No evidence of deep venous thrombosis within the visualized venous structures. The popliteal vein could not be visualized due to obscuring bandages. Electronically Signed   By: Jacqulynn Cadet M.D.   On: 10/13/2022 11:52   MR KNEE LEFT W WO CONTRAST  Result Date: 10/10/2022 CLINICAL DATA:  Septic arthritis suspected. Joint effusion and synovial thickening on previous MRI. Patient underwent arthroscopic irrigation and debridement with extensive partial synovectomy and partial medial meniscectomy on 10/03/2022. Subsequent arthroscopic irrigation and debridement 10/07/2022 with cultures growing MRSA. EXAM: MRI OF THE LEFT KNEE WITHOUT AND WITH CONTRAST TECHNIQUE: Multiplanar, multisequence MR imaging of the left knee was performed both before and after administration of intravenous contrast. CONTRAST:  96m GADAVIST GADOBUTROL 1 MMOL/ML IV SOLN COMPARISON:  MRI 10/03/2022.  Radiographs 10/02/2022. FINDINGS: Bones/Joint/Cartilage Interval increased serpiginous T2 marrow hyperintensity posteriorly in the medial femoral condyle and medial tibial plateau without significant enhancement. This is not typical for  osteomyelitis, and could reflect bone infarcts. No cortical destruction or suspicious marrow enhancement identified. There is low-level periarticular edema which is nonspecific. A small amount of air within the knee joint is attributed to the recent surgery. Unchanged moderate size knee joint effusion with diffuse synovial enhancement. Ligaments The cruciate and collateral ligaments remain intact. Postsurgical changes are present within the medial meniscus. Muscles and Tendons Intact extensor mechanism. No intramuscular fluid collection or abnormal enhancement. Soft tissue Prepatellar subcutaneous edema without focal fluid collection. Stable small Baker cyst with associated synovial enhancement. IMPRESSION: 1. Unchanged moderate size knee joint effusion with diffuse synovial enhancement, consistent with known septic arthritis. 2. Interval increased serpiginous T2 hyperintensity posteriorly in the medial femoral condyle and medial tibial plateau without significant enhancement. This is not typical for osteomyelitis, and could reflect bone infarcts. Early osteomyelitis is not completely excluded, and continued follow-up should be considered. 3. No evidence of soft tissue abscess. 4. Postsurgical changes in the medial meniscus. Electronically Signed   By: WRichardean SaleM.D.   On: 10/10/2022 11:23   [place]EKG SITE RITE  Result Date: 10/09/2022 If Site Rite image not attached, placement could not be confirmed due to current cardiac rhythm.  ECHO TEE  Result Date: 10/07/2022    TRANSESOPHOGEAL ECHO REPORT   Patient Name:   CKOY LAMPPORTERFIELD Date of Exam: 10/07/2022 Medical Rec #:  [id number]           Height:       69.0 in Accession #:    [id number]          Weight:       195.0 lb Date of Birth:  112/05/1946           BSA:          2.044 m Patient Age:    89years             BP:           140/64 mmHg Patient Gender: M                    HR:           79 bpm. Exam Location:  [facility] Procedure: Transesophageal  Echo, Color Doppler and Cardiac Doppler Indications:     Bacteremia R78.81  History:         Patient has prior history of Echocardiogram examinations, most                  recent 10/05/2022. Ischemic cardiomyopathy, CAD; Risk                  Factors:Hypertension and Dyslipidemia.  Sonographer:     Sherrie Sport Referring Phys:  [id number] Kate Sable Diagnosing Phys: Kate Sable MD PROCEDURE: The transesophogeal probe was passed without difficulty through the esophogus of the patient. Sedation performed by performing physician. The patient developed no complications during the procedure.  IMPRESSIONS  1. Left ventricular ejection fraction, by estimation, is 35 to 40%. The left ventricle has moderately decreased function.  2. Right ventricular systolic function is normal. The right ventricular size is normal.  3. No left atrial/left atrial appendage thrombus was detected.  4. The mitral valve is normal in structure. No evidence of mitral valve regurgitation.  5. The aortic valve is tricuspid. Aortic valve regurgitation is not visualized. Conclusion(s)/Recommendation(s): No evidence of vegetation/infective endocarditis on this transesophageael echocardiogram. FINDINGS  Left Ventricle: Left ventricular ejection fraction, by estimation, is 35 to 40%. The left ventricle has moderately decreased function. The left ventricular internal cavity size was normal in size. Right Ventricle: The right ventricular size is normal. No increase in right ventricular wall thickness. Right ventricular systolic function is normal. Left Atrium: Left atrial size was normal in size. No left atrial/left atrial appendage thrombus was detected. Right Atrium: Right atrial size was normal in size. Prominent Chiari network. Pericardium: There is no evidence of pericardial effusion. Mitral Valve: The mitral valve is normal in structure. No evidence of mitral valve regurgitation. Tricuspid Valve: The tricuspid valve is normal in structure.  Tricuspid valve regurgitation is trivial. Aortic Valve: The aortic valve is tricuspid. Aortic valve regurgitation is not visualized. Pulmonic Valve: The pulmonic valve was normal in structure. Pulmonic valve regurgitation is not visualized. Aorta: The aortic root is normal in size and structure. IAS/Shunts: No atrial level shunt detected by color flow Doppler. Kate Sable MD Electronically signed by Kate Sable MD Signature Date/Time: 10/07/2022/3:51:18 PM    Final    ECHOCARDIOGRAM COMPLETE  Result Date: 10/05/2022    ECHOCARDIOGRAM REPORT   Patient Name:   HARCE VOLDEN Hokenson Date of Exam: 10/05/2022 Medical Rec #:  [id number]            Height:       69.0 in Accession #:    [id number]           Weight:       195.0 lb Date of Birth:  1946/06/28            BSA:          2.044 m Patient Age:    47 years             BP:           125/58 mmHg Patient Gender: M                    HR:           80 bpm. Exam Location:  [facility] Procedure: 2D Echo and Intracardiac Opacification Agent Indications:     Bacteremia R78.81  History:         Patient has no prior history of Echocardiogram examinations.  Sonographer:     Kathlen Brunswick RDCS Referring Phys:  [id number] Tsosie Billing Diagnosing Phys: Serafina Royals MD  Sonographer Comments: Technically difficult study due to poor echo windows and suboptimal subcostal window. The study was performed while patient was positioned supine, unable to turn at this time. IMPRESSIONS  1. Left ventricular ejection fraction, by estimation, is 35 to 40%. The left ventricle has moderately decreased function. The left ventricle demonstrates regional wall motion abnormalities (see scoring diagram/findings for description). The left ventricular internal cavity size was mildly dilated. Left ventricular diastolic parameters were normal.  2. Right ventricular systolic function is normal. The right ventricular size is normal.  3. Left atrial size was mildly dilated.  4. The mitral  valve is normal in structure. Mild mitral valve regurgitation.  5. The aortic valve is normal in structure. Aortic valve regurgitation is trivial. FINDINGS  Left Ventricle: Left ventricular ejection fraction, by estimation, is 35 to 40%. The left ventricle has moderately decreased function. The left ventricle demonstrates regional wall motion abnormalities. Severe akinesis of the left ventricular, apical apical segment. Definity contrast agent was given IV to delineate the left ventricular endocardial borders. The left ventricular internal cavity size was mildly dilated. There is no left ventricular hypertrophy. Left ventricular diastolic parameters were  normal. Right Ventricle: The right ventricular size is normal. No increase in right ventricular wall thickness. Right ventricular systolic function is normal. Left Atrium: Left atrial size was mildly dilated. Right Atrium: Right atrial size was normal in size. Pericardium: There is no evidence of pericardial effusion. Mitral Valve: The mitral valve is normal in structure. Mild mitral valve regurgitation. There is no evidence of mitral valve vegetation. Tricuspid Valve: The tricuspid valve is normal in structure. Tricuspid valve regurgitation is mild. There is no evidence of tricuspid valve vegetation. Aortic Valve: The aortic valve is normal in structure. Aortic valve regurgitation is trivial. Aortic valve peak gradient measures 5.7 mmHg. There is no evidence of aortic valve vegetation. Pulmonic Valve: The pulmonic valve was normal in structure. Pulmonic valve regurgitation is trivial. Aorta: The aortic root and ascending aorta are structurally normal, with no evidence of dilitation. IAS/Shunts: No atrial level shunt detected by color flow Doppler.  LEFT VENTRICLE PLAX 2D LVIDd:         6.30 cm      Diastology LVIDs:         4.60 cm      LV e' medial:    8.05 cm/s LV PW:         1.30 cm      LV E/e' medial:  9.2 LV IVS:        0.90 cm      LV e' lateral:   11.70  cm/s LVOT diam:     2.10 cm      LV E/e' lateral: 6.3 LV SV:         69 LV SV Index:   34 LVOT Area:     3.46 cm  LV Volumes (MOD) LV vol d, MOD A2C: 157.0 ml LV vol d, MOD A4C: 106.6 ml LV vol s, MOD A2C: 80.3 ml LV vol s, MOD A4C: 64.4 ml LV SV MOD A2C:     76.7 ml LV SV MOD A4C:     106.6 ml LV SV MOD BP:      61.3 ml RIGHT VENTRICLE RV Basal diam:  2.90 cm RV S prime:     12.50 cm/s TAPSE (M-mode): 2.3 cm LEFT ATRIUM  Index        RIGHT ATRIUM           Index LA diam:      4.30 cm 2.10 cm/m   RA Area:     13.60 cm LA Vol (A2C): 72.8 ml 35.62 ml/m  RA Volume:   31.10 ml  15.22 ml/m LA Vol (A4C): 60.9 ml 29.80 ml/m  AORTIC VALVE                 PULMONIC VALVE AV Area (Vmax): 2.25 cm     PV Vmax:       0.85 m/s AV Vmax:        119.00 cm/s  PV Peak grad:  2.9 mmHg AV Peak Grad:   5.7 mmHg LVOT Vmax:      77.40 cm/s LVOT Vmean:     55.500 cm/s LVOT VTI:       0.200 m  AORTA Ao Root diam: 3.90 cm MITRAL VALVE MV Area (PHT): 4.26 cm    SHUNTS MV Decel Time: 178 msec    Systemic VTI:  0.20 m MV E velocity: 73.90 cm/s  Systemic Diam: 2.10 cm MV A velocity: 68.10 cm/s MV E/A ratio:  1.09 Serafina Royals MD Electronically signed by Serafina Royals MD Signature Date/Time: 10/05/2022/11:16:56 AM    Final    MR KNEE LEFT WO CONTRAST  Result Date: 10/03/2022 CLINICAL DATA:  Septic arthritis suspected.  X-ray done. EXAM: MRI OF THE LEFT KNEE WITHOUT CONTRAST TECHNIQUE: Multiplanar, multisequence MR imaging of the left was performed. No intravenous contrast was administered. COMPARISON:  Radiographs dated October 02, 2022 FINDINGS: MENISCI Medial: Complex tear of the posterior horn of the medial meniscus with extrusion of the meniscal body. Lateral: Intact. LIGAMENTS Cruciates: ACL and PCL are intact. Collaterals: Medial bowing of medial collateral ligament secondary to extruded meniscus. Mild edema about the femoral attachment of the medial collateral ligament suggesting ligamentous sprain/partial-thickness  tear. Lateral collateral ligament complex is intact. CARTILAGE Patellofemoral:  No chondral defect. Medial:  Moderate generalized articular cartilage thinning. Lateral:  No chondral defect. JOINT: Moderate joint effusion with synovial thickening suggesting synovitis. POPLITEAL FOSSA: Popliteus tendon is intact. Baker's cyst measuring a proximally 2.0 x 1.1 x 3.0 cm. EXTENSOR MECHANISM: Intact quadriceps tendon. Intact patellar tendon. Intact lateral patellar retinaculum. Intact medial patellar retinaculum. Intact MPFL. BONES: No aggressive osseous lesion. No fracture or dislocation. Other: Subcutaneous soft tissue edema about the knee joint. No fluid collection or abscess. Mildly increased signal of the distal quadriceps muscles suggesting reactive changes. No fluid collection or abscess. IMPRESSION: 1. Moderate knee joint effusion with synovial thickening suggesting synovitis. No adjacent fluid collection or abscess. Septic arthritis can not be excluded, clinical correlation is suggested. 2. Complex tear of the posterior horn of the medial meniscus with extrusion of the meniscal body. 3. Moderate medial tibiofemoral osteoarthritis. 4. Baker's cyst measuring 2.0 x 1.1 x 3.0 cm. 5. Mild edema of the distal quadriceps muscles suggesting reactive changes. 6. Marrow signal is within normal limits. No evidence of osteomyelitis or osseous lesion. Electronically Signed   By: Keane Police D.O.   On: 10/03/2022 11:21   US Venous Img Lower Unilateral Left  Result Date: 10/03/2022 CLINICAL DATA:  Left leg pain, swelling EXAM: LEFT LOWER EXTREMITY VENOUS DOPPLER ULTRASOUND TECHNIQUE: Gray-scale sonography with graded compression, as well as color Doppler and duplex ultrasound were performed to evaluate the lower extremity deep venous systems from the level of the common femoral vein and including the common femoral,  femoral, profunda femoral, popliteal and calf veins including the posterior tibial, peroneal and  gastrocnemius veins when visible. The superficial great saphenous vein was also interrogated. Spectral Doppler was utilized to evaluate flow at rest and with distal augmentation maneuvers in the common femoral, femoral and popliteal veins. COMPARISON:  None Available. FINDINGS: Contralateral Common Femoral Vein: Respiratory phasicity is normal and symmetric with the symptomatic side. No evidence of thrombus. Normal compressibility. Common Femoral Vein: No evidence of thrombus. Normal compressibility, respiratory phasicity and response to augmentation. Saphenofemoral Junction: No evidence of thrombus. Normal compressibility and flow on color Doppler imaging. Profunda Femoral Vein: No evidence of thrombus. Normal compressibility and flow on color Doppler imaging. Femoral Vein: No evidence of thrombus. Normal compressibility, respiratory phasicity and response to augmentation. Popliteal Vein: No evidence of thrombus. Normal compressibility, respiratory phasicity and response to augmentation. Calf Veins: Nonocclusive clot seen within the left calf peroneal vein. Posterior tibial vein patent without thrombosis. Superficial Great Saphenous Vein: No evidence of thrombus. Normal compressibility. Venous Reflux:  None visualized Other Findings: 2 cystic structures within the popliteal fossa measuring up to 6.2 cm and 3.9 cm, likely Baker's cysts. IMPRESSION: Nonocclusive thrombus seen within the left calf peroneal vein. Otherwise venous structures patent within the left lower extremity. Left Baker's cysts. Electronically Signed   By: Rolm Baptise M.D.   On: 10/03/2022 01:15   DG Knee Complete 4 Views Left  Result Date: 10/03/2022 CLINICAL DATA:  Pain with swelling EXAM: LEFT KNEE - COMPLETE 4+ VIEW COMPARISON:  None. FINDINGS: Joint effusion is present. There is anterior knee soft tissue swelling. There is no acute fracture or dislocation. There are mild degenerative changes of the knee with joint space narrowing and  minimal osteophyte formation. Lateral compartment chondrocalcinosis is present. IMPRESSION: 1. No acute fracture or dislocation. 2. Anterior knee soft tissue swelling and joint effusion. 3. Mild degenerative changes of the knee. Electronically Signed   By: Ronney Asters M.D.   On: 10/03/2022 00:08    Microbiology: Results for orders placed or performed during the hospital encounter of 10/02/22  Blood Culture (routine x 2)     Status: Abnormal   Collection Time: 10/03/22  9:06 AM   Specimen: BLOOD  Result Value Ref Range Status   Specimen Description   Final    BLOOD RIGHT [facility] Performed at [facility], [address] [place], [place] [address]    Special Requests   Final    BOTTLES DRAWN AEROBIC AND ANAEROBIC Blood Culture adequate volume Performed at [facility], [address] [place], [place] [address]    Culture  Setup Time   Final    GRAM POSITIVE COCCI IN BOTH AEROBIC AND ANAEROBIC BOTTLES CRITICAL RESULT CALLED TO, READ BACK BY AND VERIFIED WITH: Violeta Gelinas PHARMD AT 8921 10/04/2022 GAA    Culture METHICILLIN RESISTANT STAPHYLOCOCCUS AUREUS (A)  Final   Report Status 10/06/2022 FINAL  Final   Organism ID, Bacteria METHICILLIN RESISTANT STAPHYLOCOCCUS AUREUS  Final      Susceptibility   Methicillin resistant staphylococcus aureus - MIC*    CIPROFLOXACIN >=8 RESISTANT Resistant     ERYTHROMYCIN >=8 RESISTANT Resistant     GENTAMICIN <=0.5 SENSITIVE Sensitive     OXACILLIN >=4 RESISTANT Resistant     TETRACYCLINE <=1 SENSITIVE Sensitive     VANCOMYCIN 1 SENSITIVE Sensitive     TRIMETH/SULFA <=10 SENSITIVE Sensitive     CLINDAMYCIN <=0.25 SENSITIVE Sensitive     RIFAMPIN <=0.5 SENSITIVE Sensitive  Inducible Clindamycin NEGATIVE Sensitive     * METHICILLIN RESISTANT STAPHYLOCOCCUS AUREUS  Blood Culture (routine x 2)     Status: Abnormal   Collection Time: 10/03/22  9:06 AM   Specimen: BLOOD  Result Value Ref Range Status   Specimen Description    Final    BLOOD RIGTH [facility] Performed at [facility], [address], [place], [place] [address]    Special Requests   Final    BOTTLES DRAWN AEROBIC AND ANAEROBIC Blood Culture adequate volume Performed at [facility], [address], [place], [place] [address]    Culture  Setup Time   Final    GRAM POSITIVE COCCI IN BOTH AEROBIC AND ANAEROBIC BOTTLES GRAM STAIN REVIEWED-AGREE WITH RESULT CRITICAL VALUE NOTED.  VALUE IS CONSISTENT WITH PREVIOUSLY REPORTED AND CALLED VALUE. Performed at [facility], [address] [place], [place] [address]    Culture (A)  Final    STAPHYLOCOCCUS AUREUS SUSCEPTIBILITIES PERFORMED ON PREVIOUS CULTURE WITHIN THE LAST 5 DAYS. Performed at [facility], [place] [address], [place], [place] [address]    Report Status 10/06/2022 FINAL  Final  Blood Culture ID Panel (Reflexed)     Status: Abnormal   Collection Time: 10/03/22  9:06 AM  Result Value Ref Range Status   Enterococcus faecalis NOT DETECTED NOT DETECTED Final   Enterococcus Faecium NOT DETECTED NOT DETECTED Final   Listeria monocytogenes NOT DETECTED NOT DETECTED Final   Staphylococcus species DETECTED (A) NOT DETECTED Final    Comment: CRITICAL RESULT CALLED TO, READ BACK BY AND VERIFIED WITH: Violeta Gelinas PHARMD AT 0150 10/04/2022 GAA    Staphylococcus aureus (BCID) DETECTED (A) NOT DETECTED Final    Comment: Methicillin (oxacillin)-resistant Staphylococcus aureus (MRSA). MRSA is predictably resistant to beta-lactam antibiotics (except ceftaroline). Preferred therapy is vancomycin unless clinically contraindicated. Patient requires contact precautions if  hospitalized. CRITICAL RESULT CALLED TO, READ BACK BY AND VERIFIED WITH: Violeta Gelinas PHARMD AT 0370 10/04/2022 GAA    Staphylococcus epidermidis NOT DETECTED NOT DETECTED Final   Staphylococcus lugdunensis NOT DETECTED NOT DETECTED Final   Streptococcus species NOT DETECTED NOT DETECTED Final    Streptococcus agalactiae NOT DETECTED NOT DETECTED Final   Streptococcus pneumoniae NOT DETECTED NOT DETECTED Final   Streptococcus pyogenes NOT DETECTED NOT DETECTED Final   A.calcoaceticus-baumannii NOT DETECTED NOT DETECTED Final   Bacteroides fragilis NOT DETECTED NOT DETECTED Final   Enterobacterales NOT DETECTED NOT DETECTED Final   Enterobacter cloacae complex NOT DETECTED NOT DETECTED Final   Escherichia coli NOT DETECTED NOT DETECTED Final   Klebsiella aerogenes NOT DETECTED NOT DETECTED Final   Klebsiella oxytoca NOT DETECTED NOT DETECTED Final   Klebsiella pneumoniae NOT DETECTED NOT DETECTED Final   Proteus species NOT DETECTED NOT DETECTED Final   Salmonella species NOT DETECTED NOT DETECTED Final   Serratia marcescens NOT DETECTED NOT DETECTED Final   Haemophilus influenzae NOT DETECTED NOT DETECTED Final   Neisseria meningitidis NOT DETECTED NOT DETECTED Final   Pseudomonas aeruginosa NOT DETECTED NOT DETECTED Final   Stenotrophomonas maltophilia NOT DETECTED NOT DETECTED Final   Candida albicans NOT DETECTED NOT DETECTED Final   Candida auris NOT DETECTED NOT DETECTED Final   Candida glabrata NOT DETECTED NOT DETECTED Final   Candida krusei NOT DETECTED NOT DETECTED Final   Candida parapsilosis NOT DETECTED NOT DETECTED Final   Candida tropicalis NOT DETECTED NOT DETECTED Final   Cryptococcus neoformans/gattii NOT DETECTED NOT DETECTED Final   Meth resistant mecA/C and MREJ  DETECTED (A) NOT DETECTED Final    Comment: CRITICAL RESULT CALLED TO, READ BACK BY AND VERIFIED WITHVioleta Gelinas PHARMD AT 2992 10/04/2022 GAA Performed at [facility], [name] [place], [place] [address]   Aerobic/Anaerobic Culture w Gram Stain (surgical/deep wound)     Status: None   Collection Time: 10/03/22 11:45 AM   Specimen: PATH Cytology Misc. fluid; Body Fluid  Result Value Ref Range Status   Specimen Description   Final    KNEE LEFT Performed at [facility], [address] [place], [place] [address]    Special Requests   Final    NONE Performed at [facility], [address] [place], [place] [address]    Gram Stain   Final    ABUNDANT WBC PRESENT, PREDOMINANTLY PMN MODERATE GRAM POSITIVE COCCI IN PAIRS IN CLUSTERS Gram Stain Report Called to,Read Back By and Verified With:  Doretha Sou MCWHITE, RN 10/04/22 0100 A. LAFRANCE    Culture   Final    ABUNDANT METHICILLIN RESISTANT STAPHYLOCOCCUS AUREUS NO ANAEROBES ISOLATED Performed at [facility], [place] [address], [place], [place] [address]    Report Status 10/08/2022 FINAL  Final   Organism ID, Bacteria METHICILLIN RESISTANT STAPHYLOCOCCUS AUREUS  Final      Susceptibility   Methicillin resistant staphylococcus aureus - MIC*    CIPROFLOXACIN >=8 RESISTANT Resistant     ERYTHROMYCIN >=8 RESISTANT Resistant     GENTAMICIN <=0.5 SENSITIVE Sensitive     OXACILLIN >=4 RESISTANT Resistant     TETRACYCLINE <=1 SENSITIVE Sensitive     VANCOMYCIN 1 SENSITIVE Sensitive     TRIMETH/SULFA <=10 SENSITIVE Sensitive     CLINDAMYCIN <=0.25 SENSITIVE Sensitive     RIFAMPIN <=0.5 SENSITIVE Sensitive     Inducible Clindamycin NEGATIVE Sensitive     * ABUNDANT METHICILLIN RESISTANT STAPHYLOCOCCUS AUREUS  Culture, blood (Routine X 2) w Reflex to ID Panel     Status: None   Collection Time: 10/05/22  4:59 AM   Specimen: BLOOD RIGHT WRIST  Result Value Ref Range Status   Specimen Description BLOOD RIGHT WRIST  Final   Special Requests Blood Culture adequate volume  Final   Culture   Final    NO GROWTH 5 DAYS Performed at [facility], [address], [place], [place] [address]    Report Status 10/10/2022 FINAL  Final  Culture, blood (Routine X 2) w Reflex to ID Panel     Status: None   Collection Time: 10/05/22  7:04 AM   Specimen: BLOOD LEFT HAND  Result Value Ref Range Status   Specimen Description BLOOD LEFT HAND  Final   Special Requests Blood Culture adequate  volume  Final   Culture   Final    NO GROWTH 5 DAYS Performed at [facility], [address] [place], [place] [address]    Report Status 10/10/2022 FINAL  Final  Aerobic/Anaerobic Culture w Gram Stain (surgical/deep wound)     Status: None   Collection Time: 10/07/22  4:22 PM   Specimen: PATH Cytology Misc. fluid; Body Fluid  Result Value Ref Range Status   Specimen Description   Final    WOUND Performed at [facility], [address] [place], [place] [address]    Special Requests   Final    LEFT KNEE Performed at [facility], [address] [place], [place] [address]    Gram Stain   Final    MODERATE WBC  PRESENT, PREDOMINANTLY PMN FEW GRAM POSITIVE COCCI IN PAIRS    Culture   Final    ABUNDANT METHICILLIN RESISTANT STAPHYLOCOCCUS AUREUS NO ANAEROBES ISOLATED Performed at [facility], [place] [address] [place], [place] [address]    Report Status 10/12/2022 FINAL  Final   Organism ID, Bacteria METHICILLIN RESISTANT STAPHYLOCOCCUS AUREUS  Final      Susceptibility   Methicillin resistant staphylococcus aureus - MIC*    CIPROFLOXACIN >=8 RESISTANT Resistant     ERYTHROMYCIN >=8 RESISTANT Resistant     GENTAMICIN <=0.5 SENSITIVE Sensitive     OXACILLIN >=4 RESISTANT Resistant     TETRACYCLINE <=1 SENSITIVE Sensitive     VANCOMYCIN 1 SENSITIVE Sensitive     TRIMETH/SULFA <=10 SENSITIVE Sensitive     CLINDAMYCIN <=0.25 SENSITIVE Sensitive     RIFAMPIN <=0.5 SENSITIVE Sensitive     Inducible Clindamycin NEGATIVE Sensitive     * ABUNDANT METHICILLIN RESISTANT STAPHYLOCOCCUS AUREUS    Labs: CBC: Recent Labs  Lab 10/10/22 0620 10/11/22 0545 10/12/22 0640 10/13/22 0706 10/14/22 0600  WBC 19.3* 14.7* 13.5* 13.6* 11.6*  NEUTROABS  --   --  10.8*  --   --   HGB 10.8* 9.7* 9.3* 9.2* 9.2*  HCT 31.1* 27.9* 28.3* 28.2* 27.9*  MCV 85.7 86.1 88.7 90.1 90.6  PLT 521* 458* 480* 489* 239*   Basic Metabolic Panel: Recent Labs  Lab  10/08/22 0632 10/10/22 0620 10/11/22 0545 10/12/22 0640 10/13/22 0706 10/14/22 0600  NA 141 142 140 141 142 138  K 4.6 3.9 4.2 4.0 3.8 4.5  CL 110 111 107 107 105 100  CO2 20* _0 32 34*  GLUCOSE 367* 207* 218* 248* 87 329*  BUN 34* 48* 39* 31* 25* 23  CREATININE 1.06 1.02 0.99 0.93 0.90 0.93  CALCIUM 8.1* 8.1* 8.0* 8.0* 8.1* 7.9*  MG 2.3 2.3 2.1 2.2 2.1 2.1  PHOS 3.4 3.0  --  3.1  --   --    Liver Function Tests: No results for input(s): "AST", "ALT", "ALKPHOS", "BILITOT", "PROT", "ALBUMIN" in the last 168 hours. CBG: Recent Labs  Lab 10/13/22 1248 10/13/22 1630 10/13/22 2138 10/14/22 0813 10/14/22 1218  GLUCAP 222* 196* 277* 317* 209*    Discharge time spent: greater than 30 minutes.  Signed: Max Sane, MD [facility] 10/14/2022

## 2022-10-14 NOTE — Progress Notes (Signed)
Physical Therapy Treatment Patient Details Name: [name] MRN: [id number] DOB: August 14, 1946 Today's Date: 10/14/2022   History of Present Illness Patient is a 76 year old male presented to ER and admitted secondary to progressive L knee pain, s/p arthroscopic debridement, partial menisectomy (10/03/22); hospital stay complicated by uncontrolled DM. Nonocclusive left peroneal DVT with history of left lower extremity DVT. Patient underwent Arthroscopic irrigation and debridement, left knee for septic arthritis on 10/07/22    PT Comments    OOB with supervision.  Stood with min guard and vc's for hand placements.  He is able to stand and walk to nursing desk and back with no dizziness or buckling.  Overall reports feeling better but is limited by L knee discomfort with gait.  After seated rest, stands again with cues for hand placements for L LLE AROM and static standing.  Overall progressing well.  SNF remains appropriate and pt is already working on assist for home upon discharge.   Recommendations for follow up therapy are one component of a multi-disciplinary discharge planning process, led by the attending physician.  Recommendations may be updated based on patient status, additional functional criteria and insurance authorization.  Follow Up Recommendations  Skilled nursing-short term rehab (<3 hours/day)     Assistance Recommended at Discharge Frequent or constant Supervision/Assistance  Patient can return home with the following A little help with walking and/or transfers;Assistance with cooking/housework;Assist for transportation;Help with stairs or ramp for entrance;A little help with bathing/dressing/bathroom;Direct supervision/assist for medications management   Equipment Recommendations       Recommendations for Other Services       Precautions / Restrictions Precautions Precautions: Fall Restrictions Weight Bearing Restrictions: Yes LLE Weight Bearing: Weight  bearing as tolerated     Mobility  Bed Mobility Overal bed mobility: Needs Assistance Bed Mobility: Supine to Sit     Supine to sit: Supervision          Transfers Overall transfer level: Needs assistance Equipment used: Rolling walker (2 wheels) Transfers: Sit to/from Stand             General transfer comment: cues for hand placement    Ambulation/Gait Ambulation/Gait assistance: Min guard Gait Distance (Feet): 35 Feet Assistive device: Rolling walker (2 wheels) Gait Pattern/deviations: Step-through pattern, Antalgic, Trunk flexed Gait velocity: decreased     General Gait Details: to nursng desk and back with no dizziness.  improved gait but limited by knee pain   Stairs             Wheelchair Mobility    Modified Rankin (Stroke Patients Only)       Balance Overall balance assessment: Needs assistance Sitting-balance support: Feet supported, Bilateral upper extremity supported Sitting balance-Leahy Scale: Good     Standing balance support: Bilateral upper extremity supported, Reliant on assistive device for balance, During functional activity Standing balance-Leahy Scale: Fair Standing balance comment: reliant on RW for dynamic standing activity                            Cognition Arousal/Alertness: Awake/alert Behavior During Therapy: WFL for tasks assessed/performed Overall Cognitive Status: Within Functional Limits for tasks assessed                                          Exercises Other Exercises Other Exercises: standing AROM L knee and static  standing time.    General Comments        Pertinent Vitals/Pain Pain Assessment Pain Assessment: Faces Faces Pain Scale: Hurts little more Pain Location: knee Pain Descriptors / Indicators: Sore Pain Intervention(s): Limited activity within patient's tolerance, Monitored during session, Repositioned    Home Living                           Prior Function            PT Goals (current goals can now be found in the care plan section) Progress towards PT goals: Progressing toward goals    Frequency    7X/week      PT Plan Current plan remains appropriate    Co-evaluation              AM-PAC PT "6 Clicks" Mobility   Outcome Measure  Help needed turning from your back to your side while in a flat bed without using bedrails?: None Help needed moving from lying on your back to sitting on the side of a flat bed without using bedrails?: None Help needed moving to and from a bed to a chair (including a wheelchair)?: A Little Help needed standing up from a chair using your arms (e.g., wheelchair or bedside chair)?: A Little Help needed to walk in hospital room?: A Little Help needed climbing 3-5 steps with a railing? : A Lot 6 Click Score: 19    End of Session Equipment Utilized During Treatment: Gait belt Activity Tolerance: Patient tolerated treatment well;Patient limited by pain Patient left: in chair;with call bell/phone within reach;with chair alarm set Nurse Communication: Mobility status PT Visit Diagnosis: Muscle weakness (generalized) (M62.81);Difficulty in walking, not elsewhere classified (R26.2);Pain Pain - Right/Left: Left Pain - part of body: Knee     Time: 4970-2637 PT Time Calculation (min) (ACUTE ONLY): 24 min  Charges:  $Gait Training: 8-22 mins $Therapeutic Exercise: 8-22 mins                   Chesley Noon, PTA 10/14/22, 9:56 AM

## 2022-10-14 NOTE — Progress Notes (Signed)
ID Spoke to patient and partner at bed side Constipation relived He walked in the corridor Left knee pain much better  O/e awake and alert No distress Patient Vitals for the past 24 hrs:  BP Temp Temp src Pulse Resp SpO2  10/14/22 0809 129/64 98.8 F (37.1 C) -- 84 -- 95 %  10/13/22 2121 (!) 123/59 98.6 F (37 C) Oral 88 17 95 %   Rt PICC Left knee - compression dressing  Labs    Latest Ref Rng & Units 10/14/2022    6:00 AM 10/13/2022    7:06 AM 10/12/2022    6:40 AM  CBC  WBC 4.0 - 10.5 K/uL 11.6  13.6  13.5   Hemoglobin 13.0 - 17.0 g/dL 9.2  9.2  9.3   Hematocrit 39.0 - 52.0 % 27.9  28.2  28.3   Platelets 150 - 400 K/uL 465  489  480        Latest Ref Rng & Units 10/14/2022    6:00 AM 10/13/2022    7:06 AM 10/12/2022    6:40 AM  CMP  Glucose 70 - 99 mg/dL 329  87  248   BUN 8 - 23 mg/dL '23  25  31   '$ Creatinine 0.61 - 1.24 mg/dL 0.93  0.90  0.93   Sodium 135 - 145 mmol/L 138  142  141   Potassium 3.5 - 5.1 mmol/L 4.5  3.8  4.0   Chloride 98 - 111 mmol/L 100  105  107   CO2 22 - 32 mmol/L 34  32  30   Calcium 8.9 - 10.3 mg/dL 7.9  8.1  8.0      Micro 11/23 BC - MRSA 11/25 /23 No growth 10/03/22 knee- MRSA 10/07/22 knee fluid - MRSA   Impression/recommendation MRSA bacteremia  MRSA septic knee  S/p I/D X 2 TEE no endocarditis On daptomycin for 6 weeks- 11/15/22- may need PO after that Follow labs closely Follow up with me as OP on 10/31/22  AKI resolved  Urethral stricture with h/o recurrent dilatation MRSA UTI in May 2023 H/o ca bladder  CAD s/p stent on statin- watch for rhanbo while on dapto  DM- not well controlled  Discussed the management with patient and his partner

## 2022-10-14 NOTE — Plan of Care (Signed)

## 2022-10-14 NOTE — TOC Progression Note (Signed)
Transition of Care [facility]) - Progression Note    Patient Details  Name: [name] MRN: [id number] Date of Birth: 03-Jul-1946  Transition of [facility]) CM/SW [place], RN Phone Number: 10/14/2022, 12:33 PM  Clinical Narrative:     Patient to go to [facility]  [place] with Saralyn Pilar his partner  He will provide transportation He is available at 230  Expected Discharge Plan: [place] Barriers to Discharge: Ship broker  Expected Discharge Plan and Services Expected Discharge Plan: [place] arrangements for the past 2 months: Single Family Home                                       Social Determinants of Health (SDOH) Interventions    Readmission Risk Interventions     No data to display

## 2022-10-14 NOTE — Progress Notes (Signed)
Report called to [employer]

## 2022-10-18 DIAGNOSIS — E782 Mixed hyperlipidemia: Secondary | ICD-10-CM | POA: Diagnosis not present

## 2022-10-18 DIAGNOSIS — I824Z2 Acute embolism and thrombosis of unspecified deep veins of left distal lower extremity: Secondary | ICD-10-CM | POA: Diagnosis not present

## 2022-10-18 DIAGNOSIS — K219 Gastro-esophageal reflux disease without esophagitis: Secondary | ICD-10-CM | POA: Diagnosis not present

## 2022-10-18 DIAGNOSIS — E1029 Type 1 diabetes mellitus with other diabetic kidney complication: Secondary | ICD-10-CM | POA: Diagnosis not present

## 2022-10-18 DIAGNOSIS — N179 Acute kidney failure, unspecified: Secondary | ICD-10-CM | POA: Diagnosis not present

## 2022-10-18 DIAGNOSIS — B9562 Methicillin resistant Staphylococcus aureus infection as the cause of diseases classified elsewhere: Secondary | ICD-10-CM | POA: Diagnosis not present

## 2022-10-18 DIAGNOSIS — I2510 Atherosclerotic heart disease of native coronary artery without angina pectoris: Secondary | ICD-10-CM | POA: Diagnosis not present

## 2022-10-18 DIAGNOSIS — M00862 Arthritis due to other bacteria, left knee: Secondary | ICD-10-CM | POA: Diagnosis not present

## 2022-10-18 DIAGNOSIS — I10 Essential (primary) hypertension: Secondary | ICD-10-CM | POA: Diagnosis not present

## 2022-10-21 ENCOUNTER — Ambulatory Visit: Payer: Medicare HMO | Admitting: Anesthesiology

## 2022-10-21 ENCOUNTER — Other Ambulatory Visit: Payer: Self-pay

## 2022-10-21 ENCOUNTER — Encounter (HOSPITAL_COMMUNITY): Payer: Self-pay

## 2022-10-21 ENCOUNTER — Inpatient Hospital Stay: Admit: 2022-10-21 | Payer: Medicare HMO | Admitting: Internal Medicine

## 2022-10-21 ENCOUNTER — Inpatient Hospital Stay
Admission: RE | Admit: 2022-10-21 | Discharge: 2022-11-07 | DRG: 548 | Disposition: A | Discharge status: Home Health | Payer: Medicare HMO | Attending: Internal Medicine | Admitting: Internal Medicine

## 2022-10-21 ENCOUNTER — Other Ambulatory Visit: Payer: Self-pay | Admitting: Surgery

## 2022-10-21 ENCOUNTER — Encounter
Admission: RE | Disposition: A | Discharge status: Home Health | Payer: Self-pay | Source: Home / Self Care | Attending: Osteopathic Medicine

## 2022-10-21 ENCOUNTER — Encounter: Payer: Self-pay | Admitting: Surgery

## 2022-10-21 DIAGNOSIS — E1065 Type 1 diabetes mellitus with hyperglycemia: Secondary | ICD-10-CM | POA: Diagnosis not present

## 2022-10-21 DIAGNOSIS — D649 Anemia, unspecified: Secondary | ICD-10-CM | POA: Diagnosis present

## 2022-10-21 DIAGNOSIS — R42 Dizziness and giddiness: Secondary | ICD-10-CM | POA: Diagnosis not present

## 2022-10-21 DIAGNOSIS — I2510 Atherosclerotic heart disease of native coronary artery without angina pectoris: Secondary | ICD-10-CM | POA: Diagnosis not present

## 2022-10-21 DIAGNOSIS — E663 Overweight: Secondary | ICD-10-CM | POA: Diagnosis not present

## 2022-10-21 DIAGNOSIS — B9562 Methicillin resistant Staphylococcus aureus infection as the cause of diseases classified elsewhere: Secondary | ICD-10-CM | POA: Diagnosis present

## 2022-10-21 DIAGNOSIS — Z7901 Long term (current) use of anticoagulants: Secondary | ICD-10-CM | POA: Diagnosis not present

## 2022-10-21 DIAGNOSIS — Z9641 Presence of insulin pump (external) (internal): Secondary | ICD-10-CM | POA: Diagnosis present

## 2022-10-21 DIAGNOSIS — I82409 Acute embolism and thrombosis of unspecified deep veins of unspecified lower extremity: Secondary | ICD-10-CM | POA: Diagnosis present

## 2022-10-21 DIAGNOSIS — Z8042 Family history of malignant neoplasm of prostate: Secondary | ICD-10-CM

## 2022-10-21 DIAGNOSIS — E785 Hyperlipidemia, unspecified: Secondary | ICD-10-CM | POA: Diagnosis present

## 2022-10-21 DIAGNOSIS — Z794 Long term (current) use of insulin: Secondary | ICD-10-CM | POA: Diagnosis not present

## 2022-10-21 DIAGNOSIS — E10649 Type 1 diabetes mellitus with hypoglycemia without coma: Secondary | ICD-10-CM | POA: Diagnosis present

## 2022-10-21 DIAGNOSIS — I252 Old myocardial infarction: Secondary | ICD-10-CM | POA: Diagnosis not present

## 2022-10-21 DIAGNOSIS — R059 Cough, unspecified: Secondary | ICD-10-CM | POA: Diagnosis not present

## 2022-10-21 DIAGNOSIS — Z7982 Long term (current) use of aspirin: Secondary | ICD-10-CM

## 2022-10-21 DIAGNOSIS — Z79899 Other long term (current) drug therapy: Secondary | ICD-10-CM

## 2022-10-21 DIAGNOSIS — I10 Essential (primary) hypertension: Secondary | ICD-10-CM | POA: Diagnosis not present

## 2022-10-21 DIAGNOSIS — R0902 Hypoxemia: Secondary | ICD-10-CM | POA: Diagnosis not present

## 2022-10-21 DIAGNOSIS — Z961 Presence of intraocular lens: Secondary | ICD-10-CM | POA: Diagnosis present

## 2022-10-21 DIAGNOSIS — Z6828 Body mass index (BMI) 28.0-28.9, adult: Secondary | ICD-10-CM | POA: Diagnosis not present

## 2022-10-21 DIAGNOSIS — I509 Heart failure, unspecified: Secondary | ICD-10-CM | POA: Diagnosis not present

## 2022-10-21 DIAGNOSIS — J9601 Acute respiratory failure with hypoxia: Secondary | ICD-10-CM | POA: Diagnosis not present

## 2022-10-21 DIAGNOSIS — Z86718 Personal history of other venous thrombosis and embolism: Secondary | ICD-10-CM

## 2022-10-21 DIAGNOSIS — I82452 Acute embolism and thrombosis of left peroneal vein: Secondary | ICD-10-CM | POA: Diagnosis not present

## 2022-10-21 DIAGNOSIS — M1990 Unspecified osteoarthritis, unspecified site: Secondary | ICD-10-CM | POA: Diagnosis present

## 2022-10-21 DIAGNOSIS — J439 Emphysema, unspecified: Secondary | ICD-10-CM | POA: Diagnosis present

## 2022-10-21 DIAGNOSIS — K5909 Other constipation: Secondary | ICD-10-CM | POA: Diagnosis present

## 2022-10-21 DIAGNOSIS — Z9842 Cataract extraction status, left eye: Secondary | ICD-10-CM

## 2022-10-21 DIAGNOSIS — Z85828 Personal history of other malignant neoplasm of skin: Secondary | ICD-10-CM | POA: Diagnosis not present

## 2022-10-21 DIAGNOSIS — Z87891 Personal history of nicotine dependence: Secondary | ICD-10-CM

## 2022-10-21 DIAGNOSIS — I5022 Chronic systolic (congestive) heart failure: Secondary | ICD-10-CM | POA: Diagnosis not present

## 2022-10-21 DIAGNOSIS — Z8249 Family history of ischemic heart disease and other diseases of the circulatory system: Secondary | ICD-10-CM

## 2022-10-21 DIAGNOSIS — Z1639 Resistance to other specified antimicrobial drug: Secondary | ICD-10-CM | POA: Diagnosis present

## 2022-10-21 DIAGNOSIS — K219 Gastro-esophageal reflux disease without esophagitis: Secondary | ICD-10-CM | POA: Diagnosis present

## 2022-10-21 DIAGNOSIS — Z8551 Personal history of malignant neoplasm of bladder: Secondary | ICD-10-CM | POA: Diagnosis not present

## 2022-10-21 DIAGNOSIS — Z791 Long term (current) use of non-steroidal anti-inflammatories (NSAID): Secondary | ICD-10-CM

## 2022-10-21 DIAGNOSIS — M00062 Staphylococcal arthritis, left knee: Secondary | ICD-10-CM | POA: Diagnosis not present

## 2022-10-21 DIAGNOSIS — T4145XA Adverse effect of unspecified anesthetic, initial encounter: Secondary | ICD-10-CM | POA: Diagnosis not present

## 2022-10-21 DIAGNOSIS — R7881 Bacteremia: Secondary | ICD-10-CM | POA: Diagnosis not present

## 2022-10-21 DIAGNOSIS — M009 Pyogenic arthritis, unspecified: Secondary | ICD-10-CM | POA: Diagnosis present

## 2022-10-21 DIAGNOSIS — J101 Influenza due to other identified influenza virus with other respiratory manifestations: Secondary | ICD-10-CM | POA: Diagnosis not present

## 2022-10-21 DIAGNOSIS — E782 Mixed hyperlipidemia: Secondary | ICD-10-CM | POA: Diagnosis not present

## 2022-10-21 DIAGNOSIS — Z955 Presence of coronary angioplasty implant and graft: Secondary | ICD-10-CM

## 2022-10-21 DIAGNOSIS — Z1152 Encounter for screening for COVID-19: Secondary | ICD-10-CM | POA: Diagnosis not present

## 2022-10-21 DIAGNOSIS — E1051 Type 1 diabetes mellitus with diabetic peripheral angiopathy without gangrene: Secondary | ICD-10-CM | POA: Diagnosis present

## 2022-10-21 DIAGNOSIS — I952 Hypotension due to drugs: Secondary | ICD-10-CM | POA: Diagnosis not present

## 2022-10-21 DIAGNOSIS — I110 Hypertensive heart disease with heart failure: Secondary | ICD-10-CM | POA: Diagnosis not present

## 2022-10-21 DIAGNOSIS — Z9841 Cataract extraction status, right eye: Secondary | ICD-10-CM

## 2022-10-21 DIAGNOSIS — R509 Fever, unspecified: Secondary | ICD-10-CM | POA: Diagnosis not present

## 2022-10-21 HISTORY — PX: KNEE ARTHROSCOPY: SHX127

## 2022-10-21 LAB — GLUCOSE, CAPILLARY
Glucose-Capillary: 149 mg/dL — ABNORMAL HIGH (ref 70–99)
Glucose-Capillary: 151 mg/dL — ABNORMAL HIGH (ref 70–99)
Glucose-Capillary: 152 mg/dL — ABNORMAL HIGH (ref 70–99)

## 2022-10-21 LAB — COMPREHENSIVE METABOLIC PANEL
ALT: 18 U/L (ref 0–44)
AST: 20 U/L (ref 15–41)
Albumin: 2 g/dL — ABNORMAL LOW (ref 3.5–5.0)
Alkaline Phosphatase: 113 U/L (ref 38–126)
Anion gap: 7 (ref 5–15)
BUN: 22 mg/dL (ref 8–23)
CO2: 31 mmol/L (ref 22–32)
Calcium: 7.8 mg/dL — ABNORMAL LOW (ref 8.9–10.3)
Chloride: 101 mmol/L (ref 98–111)
Creatinine, Ser: 1.08 mg/dL (ref 0.61–1.24)
GFR, Estimated: >60 mL/min (ref >60)
Glucose, Bld: 154 mg/dL — ABNORMAL HIGH (ref 70–99)
Potassium: 3.6 mmol/L (ref 3.5–5.1)
Sodium: 139 mmol/L (ref 135–145)
Total Bilirubin: 0.5 mg/dL (ref 0.3–1.2)
Total Protein: 5.8 g/dL — ABNORMAL LOW (ref 6.5–8.1)

## 2022-10-21 LAB — CBC
HCT: 29.7 % — ABNORMAL LOW (ref 39.0–52.0)
Hemoglobin: 9.5 g/dL — ABNORMAL LOW (ref 13.0–17.0)
MCH: 29.3 pg (ref 26.0–34.0)
MCHC: 32 g/dL (ref 30.0–36.0)
MCV: 91.7 fL (ref 80.0–100.0)
Platelets: 348 10*3/uL (ref 150–400)
RBC: 3.24 MIL/uL — ABNORMAL LOW (ref 4.22–5.81)
RDW: 15 % (ref 11.5–15.5)
WBC: 10.6 10*3/uL — ABNORMAL HIGH (ref 4.0–10.5)
nRBC: 0 % (ref 0.0–0.2)

## 2022-10-21 LAB — BRAIN NATRIURETIC PEPTIDE: B Natriuretic Peptide: 341 pg/mL — ABNORMAL HIGH (ref 0.0–100.0)

## 2022-10-21 LAB — PROCALCITONIN: Procalcitonin: 0.15 ng/mL

## 2022-10-21 SURGERY — ARTHROSCOPY, KNEE
Anesthesia: General | Site: Knee | Laterality: Left

## 2022-10-21 MED ORDER — SODIUM CHLORIDE 0.9 % IR SOLN
Status: DC | PRN
  Administered 2022-10-21: 3012 mL

## 2022-10-21 MED ORDER — ONDANSETRON HCL 4 MG PO TABS: 4.0000 mg | ORAL_TABLET | Freq: Four times a day (QID) | ORAL | Status: DC | PRN

## 2022-10-21 MED ORDER — MIDAZOLAM HCL 2 MG/2ML IJ SOLN
INTRAMUSCULAR | Status: AC
  Filled 2022-10-21: qty 2

## 2022-10-21 MED ORDER — SODIUM CHLORIDE 0.9% FLUSH: 10.0000 mL | INTRAVENOUS | Status: DC | PRN

## 2022-10-21 MED ORDER — ACETAMINOPHEN 10 MG/ML IV SOLN: 1000.0000 mg | Freq: Once | INTRAVENOUS | Status: DC | PRN

## 2022-10-21 MED ORDER — BUPIVACAINE-EPINEPHRINE (PF) 0.5% -1:200000 IJ SOLN
INTRAMUSCULAR | Status: AC
  Filled 2022-10-21: qty 60

## 2022-10-21 MED ORDER — OXYCODONE HCL 5 MG/5ML PO SOLN: 5.0000 mg | Freq: Once | ORAL | Status: DC | PRN

## 2022-10-21 MED ORDER — MORPHINE SULFATE (PF) 2 MG/ML IV SOLN
1.0000 mg | INTRAVENOUS | Status: DC | PRN
  Administered 2022-10-22: 1 mg via INTRAVENOUS
  Filled 2022-10-21: qty 1

## 2022-10-21 MED ORDER — PHENYLEPHRINE HCL (PRESSORS) 10 MG/ML IV SOLN
INTRAVENOUS | Status: DC | PRN
  Administered 2022-10-21: 120 ug via INTRAVENOUS
  Administered 2022-10-21: 80 ug via INTRAVENOUS
  Administered 2022-10-21: 120 ug via INTRAVENOUS
  Administered 2022-10-21: 160 ug via INTRAVENOUS
  Administered 2022-10-21: 120 ug via INTRAVENOUS
  Administered 2022-10-21: 80 ug via INTRAVENOUS
  Administered 2022-10-21: 120 ug via INTRAVENOUS

## 2022-10-21 MED ORDER — SUCCINYLCHOLINE CHLORIDE 200 MG/10ML IV SOSY
PREFILLED_SYRINGE | INTRAVENOUS | Status: DC | PRN
  Administered 2022-10-21: 100 mg via INTRAVENOUS

## 2022-10-21 MED ORDER — LACTATED RINGERS IR SOLN
Status: DC | PRN
  Administered 2022-10-21 (×2): 3000 mL

## 2022-10-21 MED ORDER — DIPHENHYDRAMINE HCL 25 MG PO CAPS
25.0000 mg | ORAL_CAPSULE | Freq: Every evening | ORAL | Status: DC | PRN
  Administered 2022-11-04 – 2022-11-06 (×3): 25 mg via ORAL
  Filled 2022-10-21 (×4): qty 1

## 2022-10-21 MED ORDER — ATORVASTATIN CALCIUM 20 MG PO TABS
40.0000 mg | ORAL_TABLET | Freq: Every day | ORAL | Status: DC
  Administered 2022-10-22 – 2022-11-01 (×11): 40 mg via ORAL
  Filled 2022-10-21 (×11): qty 2

## 2022-10-21 MED ORDER — FUROSEMIDE 40 MG PO TABS
40.0000 mg | ORAL_TABLET | Freq: Every day | ORAL | Status: DC
  Administered 2022-10-22 – 2022-10-27 (×6): 40 mg via ORAL
  Filled 2022-10-21 (×6): qty 1

## 2022-10-21 MED ORDER — ACETAMINOPHEN 10 MG/ML IV SOLN
INTRAVENOUS | Status: AC
  Filled 2022-10-21: qty 100

## 2022-10-21 MED ORDER — OXYCODONE HCL 5 MG PO TABS: 5.0000 mg | ORAL_TABLET | Freq: Once | ORAL | Status: DC | PRN

## 2022-10-21 MED ORDER — DIPHENHYDRAMINE HCL 50 MG/ML IJ SOLN
INTRAMUSCULAR | Status: DC | PRN
  Administered 2022-10-21: 6.25 mg via INTRAVENOUS

## 2022-10-21 MED ORDER — CHLORHEXIDINE GLUCONATE CLOTH 2 % EX PADS
6.0000 | MEDICATED_PAD | Freq: Every day | CUTANEOUS | Status: DC
  Administered 2022-10-22 – 2022-11-07 (×17): 6 via TOPICAL

## 2022-10-21 MED ORDER — LIDOCAINE HCL (CARDIAC) PF 100 MG/5ML IV SOSY
PREFILLED_SYRINGE | INTRAVENOUS | Status: DC | PRN
  Administered 2022-10-21: 100 mg via INTRAVENOUS
  Administered 2022-10-21: 50 mg via INTRAVENOUS

## 2022-10-21 MED ORDER — LIDOCAINE HCL (PF) 2 % IJ SOLN
INTRAMUSCULAR | Status: AC
  Filled 2022-10-21: qty 5

## 2022-10-21 MED ORDER — CHLORHEXIDINE GLUCONATE 0.12 % MT SOLN
OROMUCOSAL | Status: AC
  Administered 2022-10-21: 15 mL via OROMUCOSAL
  Filled 2022-10-21: qty 15

## 2022-10-21 MED ORDER — CARVEDILOL 3.125 MG PO TABS
6.2500 mg | ORAL_TABLET | Freq: Two times a day (BID) | ORAL | Status: DC
  Administered 2022-10-22 – 2022-10-27 (×11): 6.25 mg via ORAL
  Filled 2022-10-21 (×11): qty 2

## 2022-10-21 MED ORDER — URIBEL 118 MG PO CAPS: 1.0000 | ORAL_CAPSULE | Freq: Four times a day (QID) | ORAL | Status: DC | PRN

## 2022-10-21 MED ORDER — SODIUM CHLORIDE 0.9 % IV SOLN: INTRAVENOUS | Status: DC

## 2022-10-21 MED ORDER — FAMOTIDINE 20 MG PO TABS
20.0000 mg | ORAL_TABLET | Freq: Once | ORAL | Status: AC
  Administered 2022-10-21: 20 mg via ORAL

## 2022-10-21 MED ORDER — EPHEDRINE 5 MG/ML INJ
INTRAVENOUS | Status: AC
  Filled 2022-10-21: qty 5

## 2022-10-21 MED ORDER — DIPHENHYDRAMINE HCL 50 MG/ML IJ SOLN
INTRAMUSCULAR | Status: AC
  Filled 2022-10-21: qty 1

## 2022-10-21 MED ORDER — LIDOCAINE HCL (PF) 1 % IJ SOLN
INTRAMUSCULAR | Status: AC
  Filled 2022-10-21: qty 30

## 2022-10-21 MED ORDER — CHLORHEXIDINE GLUCONATE 0.12 % MT SOLN: 15.0000 mL | Freq: Once | OROMUCOSAL | Status: AC

## 2022-10-21 MED ORDER — FENTANYL CITRATE (PF) 100 MCG/2ML IJ SOLN
INTRAMUSCULAR | Status: DC | PRN
  Administered 2022-10-21: 50 ug via INTRAVENOUS
  Administered 2022-10-21 (×2): 25 ug via INTRAVENOUS

## 2022-10-21 MED ORDER — PANTOPRAZOLE SODIUM 40 MG PO TBEC
40.0000 mg | DELAYED_RELEASE_TABLET | Freq: Every day | ORAL | Status: DC
  Administered 2022-10-22 – 2022-11-07 (×17): 40 mg via ORAL
  Filled 2022-10-21 (×17): qty 1

## 2022-10-21 MED ORDER — HYOSCYAMINE SULFATE 0.125 MG SL SUBL: 0.1250 mg | SUBLINGUAL_TABLET | SUBLINGUAL | Status: DC | PRN

## 2022-10-21 MED ORDER — ASPIRIN 81 MG PO TBEC
81.0000 mg | DELAYED_RELEASE_TABLET | Freq: Every day | ORAL | Status: DC
  Administered 2022-10-22 – 2022-11-07 (×16): 81 mg via ORAL
  Filled 2022-10-21 (×17): qty 1

## 2022-10-21 MED ORDER — DOCUSATE SODIUM 100 MG PO CAPS
100.0000 mg | ORAL_CAPSULE | Freq: Two times a day (BID) | ORAL | Status: DC
  Administered 2022-10-21 – 2022-11-07 (×32): 100 mg via ORAL
  Filled 2022-10-21 (×34): qty 1

## 2022-10-21 MED ORDER — NEOMYCIN-POLYMYXIN B GU 40-200000 IR SOLN
Status: AC
  Filled 2022-10-21: qty 20

## 2022-10-21 MED ORDER — APIXABAN 5 MG PO TABS
5.0000 mg | ORAL_TABLET | Freq: Two times a day (BID) | ORAL | Status: DC
  Administered 2022-10-22 – 2022-11-07 (×33): 5 mg via ORAL
  Filled 2022-10-21 (×33): qty 1

## 2022-10-21 MED ORDER — PHENYLEPHRINE 80 MCG/ML (10ML) SYRINGE FOR IV PUSH (FOR BLOOD PRESSURE SUPPORT)
PREFILLED_SYRINGE | INTRAVENOUS | Status: AC
  Filled 2022-10-21: qty 10

## 2022-10-21 MED ORDER — EPHEDRINE SULFATE (PRESSORS) 50 MG/ML IJ SOLN
INTRAMUSCULAR | Status: DC | PRN
  Administered 2022-10-21: 5 mg via INTRAVENOUS
  Administered 2022-10-21: 7.5 mg via INTRAVENOUS
  Administered 2022-10-21: 2.5 mg via INTRAVENOUS
  Administered 2022-10-21 (×2): 7.5 mg via INTRAVENOUS

## 2022-10-21 MED ORDER — SODIUM CHLORIDE 0.9 % IV SOLN
700.0000 mg | INTRAVENOUS | Status: DC
  Administered 2022-10-21 – 2022-11-01 (×11): 700 mg via INTRAVENOUS
  Filled 2022-10-21 (×6): qty 14
  Filled 2022-10-21: qty 10
  Filled 2022-10-21 (×2): qty 14
  Filled 2022-10-21: qty 10
  Filled 2022-10-21: qty 14

## 2022-10-21 MED ORDER — PROPOFOL 10 MG/ML IV BOLUS
INTRAVENOUS | Status: AC
  Filled 2022-10-21: qty 20

## 2022-10-21 MED ORDER — INSULIN GLARGINE-YFGN 100 UNIT/ML ~~LOC~~ SOLN
28.0000 [IU] | Freq: Every day | SUBCUTANEOUS | Status: DC
  Administered 2022-10-22 – 2022-10-31 (×9): 28 [IU] via SUBCUTANEOUS
  Filled 2022-10-21 (×11): qty 0.28

## 2022-10-21 MED ORDER — EPINEPHRINE PF 1 MG/ML IJ SOLN
INTRAMUSCULAR | Status: AC
  Filled 2022-10-21: qty 3

## 2022-10-21 MED ORDER — ADULT MULTIVITAMIN W/MINERALS CH
1.0000 | ORAL_TABLET | Freq: Every day | ORAL | Status: DC
  Administered 2022-10-22 – 2022-11-07 (×17): 1 via ORAL
  Filled 2022-10-21 (×18): qty 1

## 2022-10-21 MED ORDER — DAPTOMYCIN IV (FOR PTA / DISCHARGE USE ONLY): 700.0000 mg | INTRAVENOUS | Status: DC

## 2022-10-21 MED ORDER — ACETAMINOPHEN 325 MG PO TABS
650.0000 mg | ORAL_TABLET | Freq: Four times a day (QID) | ORAL | Status: DC | PRN
  Administered 2022-10-21 – 2022-10-31 (×6): 650 mg via ORAL
  Filled 2022-10-21 (×6): qty 2

## 2022-10-21 MED ORDER — CEFAZOLIN SODIUM-DEXTROSE 2-4 GM/100ML-% IV SOLN
2.0000 g | INTRAVENOUS | Status: AC
  Administered 2022-10-21: 2 g via INTRAVENOUS

## 2022-10-21 MED ORDER — FAMOTIDINE 20 MG PO TABS
ORAL_TABLET | ORAL | Status: AC
  Filled 2022-10-21: qty 1

## 2022-10-21 MED ORDER — SODIUM CHLORIDE 0.9% FLUSH
10.0000 mL | Freq: Two times a day (BID) | INTRAVENOUS | Status: DC
  Administered 2022-10-21 – 2022-10-24 (×6): 10 mL
  Administered 2022-10-24: 20 mL
  Administered 2022-10-25 – 2022-10-28 (×5): 10 mL
  Administered 2022-10-29: 20 mL
  Administered 2022-10-29 – 2022-11-07 (×11): 10 mL

## 2022-10-21 MED ORDER — PROPOFOL 10 MG/ML IV BOLUS
INTRAVENOUS | Status: DC | PRN
  Administered 2022-10-21: 100 mg via INTRAVENOUS
  Administered 2022-10-21: 50 mg via INTRAVENOUS

## 2022-10-21 MED ORDER — FENTANYL CITRATE (PF) 100 MCG/2ML IJ SOLN: 25.0000 ug | INTRAMUSCULAR | Status: DC | PRN

## 2022-10-21 MED ORDER — FENTANYL CITRATE (PF) 100 MCG/2ML IJ SOLN
INTRAMUSCULAR | Status: AC
  Filled 2022-10-21: qty 2

## 2022-10-21 MED ORDER — APIXABAN (ELIQUIS) VTE STARTER PACK (10MG AND 5MG): ORAL_TABLET | Freq: Every day | ORAL | Status: DC

## 2022-10-21 MED ORDER — LACTATED RINGERS IV SOLN: INTRAVENOUS | Status: DC

## 2022-10-21 MED ORDER — ONDANSETRON HCL 4 MG/2ML IJ SOLN
INTRAMUSCULAR | Status: AC
  Filled 2022-10-21: qty 2

## 2022-10-21 MED ORDER — MIDAZOLAM HCL 2 MG/2ML IJ SOLN
INTRAMUSCULAR | Status: DC | PRN
  Administered 2022-10-21: 1 mg via INTRAVENOUS

## 2022-10-21 MED ORDER — ONDANSETRON HCL 4 MG/2ML IJ SOLN: 4.0000 mg | Freq: Once | INTRAMUSCULAR | Status: DC | PRN

## 2022-10-21 MED ORDER — ONDANSETRON HCL 4 MG/2ML IJ SOLN
INTRAMUSCULAR | Status: DC | PRN
  Administered 2022-10-21: 4 mg via INTRAVENOUS

## 2022-10-21 MED ORDER — ONDANSETRON HCL 4 MG/2ML IJ SOLN
4.0000 mg | Freq: Four times a day (QID) | INTRAMUSCULAR | Status: DC | PRN
  Administered 2022-10-25 – 2022-10-26 (×2): 4 mg via INTRAVENOUS
  Filled 2022-10-21 (×2): qty 2

## 2022-10-21 MED ORDER — KETAMINE HCL 10 MG/ML IJ SOLN
INTRAMUSCULAR | Status: DC | PRN
  Administered 2022-10-21: 15 mg via INTRAVENOUS

## 2022-10-21 MED ORDER — ACETAMINOPHEN 10 MG/ML IV SOLN
INTRAVENOUS | Status: DC | PRN
  Administered 2022-10-21: 1000 mg via INTRAVENOUS

## 2022-10-21 MED ORDER — KETAMINE HCL 50 MG/5ML IJ SOSY
PREFILLED_SYRINGE | INTRAMUSCULAR | Status: AC
  Filled 2022-10-21: qty 5

## 2022-10-21 MED ORDER — ACETAMINOPHEN 650 MG RE SUPP: 650.0000 mg | Freq: Four times a day (QID) | RECTAL | Status: DC | PRN

## 2022-10-21 MED ORDER — INSULIN ASPART 100 UNIT/ML IJ SOLN
0.0000 [IU] | Freq: Three times a day (TID) | INTRAMUSCULAR | Status: DC
  Administered 2022-10-22 – 2022-10-23 (×3): 2 [IU] via SUBCUTANEOUS
  Administered 2022-10-24: 3 [IU] via SUBCUTANEOUS
  Administered 2022-10-24: 5 [IU] via SUBCUTANEOUS
  Administered 2022-10-24: 2 [IU] via SUBCUTANEOUS
  Administered 2022-10-25 (×2): 1 [IU] via SUBCUTANEOUS
  Administered 2022-10-26 – 2022-10-27 (×2): 3 [IU] via SUBCUTANEOUS
  Administered 2022-10-27: 2 [IU] via SUBCUTANEOUS
  Administered 2022-10-29 – 2022-10-30 (×2): 3 [IU] via SUBCUTANEOUS
  Administered 2022-10-30: 1 [IU] via SUBCUTANEOUS
  Administered 2022-10-31: 7 [IU] via SUBCUTANEOUS
  Administered 2022-10-31: 2 [IU] via SUBCUTANEOUS
  Administered 2022-11-02: 3 [IU] via SUBCUTANEOUS
  Administered 2022-11-04: 2 [IU] via SUBCUTANEOUS
  Administered 2022-11-05: 5 [IU] via SUBCUTANEOUS
  Administered 2022-11-06 (×2): 3 [IU] via SUBCUTANEOUS
  Administered 2022-11-07: 2 [IU] via SUBCUTANEOUS
  Filled 2022-10-21 (×25): qty 1

## 2022-10-21 MED ORDER — INSULIN ASPART 100 UNIT/ML IJ SOLN
0.0000 [IU] | Freq: Every day | INTRAMUSCULAR | Status: DC
  Administered 2022-10-22: 2 [IU] via SUBCUTANEOUS
  Administered 2022-10-23: 3 [IU] via SUBCUTANEOUS
  Administered 2022-10-24 – 2022-11-06 (×4): 2 [IU] via SUBCUTANEOUS
  Filled 2022-10-21 (×6): qty 1

## 2022-10-21 MED ORDER — LACTULOSE 10 GM/15ML PO SOLN
20.0000 g | Freq: Every day | ORAL | Status: DC
  Administered 2022-10-22 – 2022-11-05 (×15): 20 g via ORAL
  Filled 2022-10-21 (×18): qty 30

## 2022-10-21 MED ORDER — CEFAZOLIN SODIUM-DEXTROSE 2-4 GM/100ML-% IV SOLN
INTRAVENOUS | Status: AC
  Filled 2022-10-21: qty 100

## 2022-10-21 SURGICAL SUPPLY — 45 items
APL PRP STRL LF DISP 70% ISPRP (MISCELLANEOUS) ×1
BAG COUNTER SPONGE SURGICOUNT (BAG) IMPLANT
BAG SPNG CNTER NS LX DISP (BAG)
BLADE FULL RADIUS 3.5 (BLADE) ×1 IMPLANT
BLADE SHAVER 4.5X7 STR FR (MISCELLANEOUS) ×1 IMPLANT
BNDG ELASTIC 6X5.8 VLCR STR LF (GAUZE/BANDAGES/DRESSINGS) ×1 IMPLANT
BNDG ESMARK 6X12 TAN STRL LF (GAUZE/BANDAGES/DRESSINGS) ×1 IMPLANT
CATH ROBINSON RED A/P 12FR (CATHETERS) IMPLANT
CHLORAPREP W/TINT 26 (MISCELLANEOUS) ×1 IMPLANT
COLLECTOR GRAFT TISSUE (SYSTAGENIX WOUND MANAGEMENT)
CUFF TOURN SGL QUICK 24 (TOURNIQUET CUFF)
CUFF TOURN SGL QUICK 34 (TOURNIQUET CUFF)
CUFF TRNQT CYL 24X4X16.5-23 (TOURNIQUET CUFF) IMPLANT
CUFF TRNQT CYL 34X4.125X (TOURNIQUET CUFF) IMPLANT
CUP MEDICINE 2OZ PLAST GRAD ST (MISCELLANEOUS) ×1 IMPLANT
DRAPE ARTHRO LIMB 89X125 STRL (DRAPES) ×1 IMPLANT
DRAPE IMP U-DRAPE 54X76 (DRAPES) ×1 IMPLANT
ELECT REM PT RETURN 9FT ADLT (ELECTROSURGICAL) ×1
ELECTRODE REM PT RTRN 9FT ADLT (ELECTROSURGICAL) ×1 IMPLANT
GAUZE SPONGE 4X4 12PLY STRL (GAUZE/BANDAGES/DRESSINGS) ×1 IMPLANT
GLOVE BIO SURGEON STRL SZ8 (GLOVE) ×2 IMPLANT
GLOVE SURG UNDER LTX SZ8 (GLOVE) ×1 IMPLANT
GOWN STRL REUS W/ TWL LRG LVL3 (GOWN DISPOSABLE) ×1 IMPLANT
GOWN STRL REUS W/ TWL XL LVL3 (GOWN DISPOSABLE) ×2 IMPLANT
GOWN STRL REUS W/TWL LRG LVL3 (GOWN DISPOSABLE) ×1
GOWN STRL REUS W/TWL XL LVL3 (GOWN DISPOSABLE) ×2
IV LACTATED RINGER IRRG 3000ML (IV SOLUTION) ×1
IV LR IRRIG 3000ML ARTHROMATIC (IV SOLUTION) ×1 IMPLANT
KIT TURNOVER KIT A (KITS) ×1 IMPLANT
MANIFOLD NEPTUNE II (INSTRUMENTS) ×2 IMPLANT
NDL HYPO 21X1.5 SAFETY (NEEDLE) ×1 IMPLANT
NEEDLE HYPO 21X1.5 SAFETY (NEEDLE) ×1 IMPLANT
PACK ARTHROSCOPY KNEE (MISCELLANEOUS) ×1 IMPLANT
SLEEVE REMOTE CONTROL 5X12 (DRAPES) IMPLANT
SPONGE T-LAP 18X18 ~~LOC~~+RFID (SPONGE) ×1 IMPLANT
SUT PROLENE 4 0 PS 2 18 (SUTURE) ×1 IMPLANT
SUT TICRON COATED BLUE 2 0 30 (SUTURE) IMPLANT
SYR 30ML LL (SYRINGE) ×1 IMPLANT
SYR 50ML LL SCALE MARK (SYRINGE) ×1 IMPLANT
SYR TOOMEY 50ML (SYRINGE) IMPLANT
TISSUE GRAFT COLLECTOR (SYSTAGENIX WOUND MANAGEMENT) IMPLANT
TRAP FLUID SMOKE EVACUATOR (MISCELLANEOUS) ×1 IMPLANT
TUBING INFLOW SET DBFLO PUMP (TUBING) ×1 IMPLANT
WAND WEREWOLF FLOW 90D (MISCELLANEOUS) ×1 IMPLANT
WATER STERILE IRR 500ML POUR (IV SOLUTION) ×1 IMPLANT

## 2022-10-21 NOTE — H&P (Signed)
H&P reviewed and patient re-examined. No changes.

## 2022-10-21 NOTE — Op Note (Signed)
10/21/2022  4:54 PM  Patient:   [name]  Pre-Op Diagnosis:   Recurrent/persistent septic arthritis, left knee.  Postoperative diagnosis:   Same  Procedure:   Arthroscopic irrigation debridement of left knee.  Surgeon:   Pascal Lux, MD  Anesthesia:   General LMA  Findings:   As above. Diffuse grade III chondromalacia degenerative changes are noted throughout all compartments. There is evidence of a prior partial medial meniscectomy with residual degenerative fraying. The lateral meniscus also demonstrates evidence of a prior partial meniscectomy with residual degenerative changes. The anterior and posterior cruciate ligaments both are in satisfactory condition.  Complications:   None  EBL:   15 cc.  Total fluids:   700 cc of crystalloid.  Tourniquet time:   38 minutes at 300 mmHg  Drains:   Large Hemovac x 1  Closure:   4-0 Prolene interrupted sutures.  Brief clinical note:   The patient is a 76 year old male who underwent two prior arthroscopic irrigation and debridement for septic arthritis of his left knee 3 weeks ago. The patient appears to be doing well and was discharged to a rehab facility with IV antibiotics last week. The patient returned to the office for suture removal this morning it was noted to have purulent drainage coming from the lateral portal site of his knee. Therefore, the patient has been readmitted and presents at this time for a repeat arthroscopic debridement of the left knee for recurrent/persistent septic arthritis.  Procedure:   The patient was brought into the operating room and lain in the supine position. After adequate general endotracheal intubation and anesthesia was obtained, the left lower extremity was prepped with ChloraPrep solution before being draped sterilely. Preoperative antibiotics were administered. The leg was elevated for several minutes before the tourniquet was inflated to 300 mmHg retained sutures were removed from  the anteromedial anterolateral portal sites.  The camera was placed in the anterolateral portal and instrumentation performed through the anteromedial portal.  Approximately 8 to 10 cc of purulent fluid was collected from the cannula and sent to pathology for culture and sensitivity before the camera was actually introduced.  The knee was sequentially examined beginning in the suprapatellar pouch, then progressing to the patellofemoral space, the medial gutter and compartment, the notch, and finally the lateral compartment and gutter. Aggressive debridement was carried out throughout the suprapatella compartment as well as along the medial and lateral gutters, the medial and lateral compartments, and in the anterior aspect of the knee. The knee was irrigated thoroughly using 3 L of sterile saline solution followed by 3 L of antibiotic irrigation, followed by an additional 3 L of sterile saline solution. Hemostasis was achieved using the ArthroCare wand.  A large Hemovac drain was inserted through a cannula placed into the anterolateral aspect of the knee. The portal sites were closed using 4-0 Prolene interrupted sutures before a sterile bulky dressing was applied to the knee. The patient was then awakened, extubated, and returned to the recovery room in satisfactory condition after tolerating the procedure well.

## 2022-10-21 NOTE — Hospital Course (Addendum)
Patient is a 76 year old male with past medical history of type 1 diabetes mellitus on insulin pump, history of left leg DVT on Eliquis, CAD status post angioplasty, bladder cancer and previous history of MRSA infection who presented to the emergency room on 11/22 and was admitted to the hospitalist service for recurrent septic arthritis of left knee.    Patient underwent irrigation debridement and partial meniscectomy on 11/23 with repeat irrigation debridement on 11/27 and was placed on IV daptomycin for 6 weeks.  He was discharged on 12/4 to skilled nursing.    Patient was seen in the orthopedic office on the morning of 12/11 for suture removal and noted to have purulent drainage coming from the wound.  Orthopedics  took patient to the OR on 12/11 afternoon for repeat arthroscopic debridement and hospitalists were contacted for admission. Hemovac drain removed 10/23/22, possible need for additional washout on 10/24/22. Cultures showing few staph aureus.  Gram stain with rare gram + cocci in singles - pending susceptibilities as of 12/15 AM.  Patient is currently on Daptomycin. Culture came back as (+)MRSA, given lack of substantial improvement, ID would like to change abx and keep him here through the weekend. SNF rehab plan for Monday if doing well.   Evening 12/15 elevated temp and cough, ordered CXR and repeat BCx and UA though primary concern is for knee. 12/16 reviewed results - CXR concerning for interstitial edema/pneumonia worse on R, remains w/ fever, SpO2 documented as 70s on RA asked RN team to reassess and confirm if on O2. AM labs show normal WBC, Hgb to 7.6. UA not collected. Asked ID on call (Dr Linus Salmons) to review abx and determine if any changes needed - he confirmed ok to continue w/ current plan. UA did return w/ large Hgb, no leuks/WBC but showing >50 WBC/HPF on micorscopy - await culture results. Serum WBC normal - not meeting sepsis criteria. Hypoxic on room air improved on [place]. 12/17 CT  neg PE, did show bronchial thickening w/ mucoid impaction in lower lobes c/w infectious vs inflammatory bronchitis, mild emphysema, reflux IV contrast in the hepatic veins suggestive of increased right heart pressure. Incentive spirometry and another dose lasix po ordered for today. Remains afebrile and denies SOB    Consultants:  Orthopedic surgery Infectious disease   Procedures: 10/21/22 Arthroscopic irrigation debridement of left knee (Dr [name] - ortho surgery)        ASSESSMENT & PLAN:   Principal Problem:   Septic arthritis of knee, left ([place]) Active Problems:   Chronic systolic CHF (congestive heart failure) ([place])   Uncontrolled type 1 diabetes mellitus with hyperglycemia, with long-term current use of insulin ([name])   DVT (deep venous thrombosis) ([name])   HYPERTENSION, BENIGN   Chronic constipation   Overweight (BMI 25.0-29.9)   Septic arthritis of knee, left ([place]) MRSA Appreciate orthopedic intervention and ID guidance.   Status post repeat arthroscopic debridement 10/21/22.   Continue IV antibiotics per ID --> added ceftaroline to daptomycin 10/25/22    Fever - resolved Concern for bacteremia vs pneumonia vs UTI Continue current abx  Await repeat cultures  --> BCx neg   Acute hypoxic respiratory failure - improved  D/t mild CHF exacerbation, bronchitis  Await BNP and procalcitonin to eval PNA vs CHF --> more likely mild CHF given elevated BNP, CT findings reflux contrast to hepatic, however also likely bronchitis component  Supplemental O2 as needed Intermittent diuresis  Chronic systolic CHF (congestive heart failure) ([place]) At patient's last hospitalization, 2D echo  and TEE confirmed ejection fraction of 35 to 40%.  BNP minimally elevated, has received 1 dose of IV Lasix on this admission  No rales on exam today or yesterday, slight elevation in BN P with hypoxic respiratory insufficiency Lasix daily   Uncontrolled type 1 diabetes mellitus with  hyperglycemia, with long-term current use of insulin ([name]) Resume home medications and sliding scale   DVT (deep venous thrombosis) ([name]) on Eliquis.     HYPERTENSION, BENIGN Stable. Hypotension secondary to anesthesia.   Will resume home medications when blood pressure starts to rise   Chronic constipation Patient has issues with chronic constipation.   He states he has not had a bowel movement since the day he discharged a week prior to this admission.   Will start some Colace and lactulose.   Overweight (BMI 25.0-29.9) Meets criteria BMI greater than 25    DVT prophylaxis: eliquis Pertinent IV fluids/nutrition: no continuous IV fluids Central lines / invasive devices: none  Code Status: FULL CODE  Disposition: inpatient TOC needs: SNF Barriers to discharge / significant pending items: here thru the weekend per ID recs on new IV abx and given fever on Friday, if improving / stable on Monday plan for discharge to SNF

## 2022-10-21 NOTE — Transfer of Care (Signed)
Immediate Anesthesia Transfer of Care Note  Patient: [name]  Procedure(s) Performed: ARTHROSCOPIC INCISION AND DRAINAGE KNEE-LEFT (Left: Knee)  Patient Location: PACU  Anesthesia Type:General  Level of Consciousness: drowsy  Airway & Oxygen Therapy: Patient Spontanous Breathing and Patient connected to nasal cannula oxygen  Post-op Assessment: Report given to RN and Post -op Vital signs reviewed and stable  Post vital signs: Reviewed and stable  Last Vitals:  Vitals Value Taken Time  BP 118/50 10/21/22 1652  Temp 99.7   Pulse 77 10/21/22 1655  Resp 12 10/21/22 1655  SpO2 100 % 10/21/22 1655  Vitals shown include unvalidated device data.  Last Pain:  Vitals:   10/21/22 1454  TempSrc: Oral  PainSc: 0-No pain         Complications: No notable events documented.

## 2022-10-21 NOTE — Anesthesia Procedure Notes (Signed)
Procedure Name: Intubation Date/Time: 10/21/2022 3:48 PM  Performed by: Venecia Mehl, [place], CRNAPre-anesthesia Checklist: Patient identified, Patient being monitored, Timeout performed, Emergency Drugs available and Suction available Patient Re-evaluated:Patient Re-evaluated prior to induction Oxygen Delivery Method: Circle system utilized Preoxygenation: Pre-oxygenation with 100% oxygen Induction Type: IV induction, Rapid sequence and Cricoid Pressure applied Laryngoscope Size: Mac and 3 Grade View: Grade I Tube type: Oral Tube size: 7.0 mm Number of attempts: 1 Airway Equipment and Method: Stylet Placement Confirmation: ETT inserted through vocal cords under direct vision, positive ETCO2 and breath sounds checked- equal and bilateral Secured at: 21 cm Tube secured with: Tape Dental Injury: Teeth and Oropharynx as per pre-operative assessment

## 2022-10-21 NOTE — Plan of Care (Signed)
  Problem: Education: Goal: Ability to describe self-care measures that may prevent or decrease complications (Diabetes Survival Skills Education) will improve Outcome: Progressing Goal: Individualized Educational Video(s) Outcome: Progressing   

## 2022-10-21 NOTE — Assessment & Plan Note (Addendum)
On Eliquis

## 2022-10-21 NOTE — Assessment & Plan Note (Addendum)
Appreciate orthopedic intervention.  Status post repeat arthroscopic debridement.  Continue IV Ceftaroline and Vancomycin. Will need 4 weeks total. Will attempt to get [facility] set up so that he may continue therapy at home.

## 2022-10-21 NOTE — Assessment & Plan Note (Signed)
Patient has issues with chronic constipation.  He states he has not had a bowel movement since the day he discharged a week ago.  Will start some Colace and lactulose.

## 2022-10-21 NOTE — Assessment & Plan Note (Addendum)
On long and short-acting insulins Has SSI coverage
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
## 2022-10-21 NOTE — Assessment & Plan Note (Addendum)
At patient's last hospitalization, 2D echo and TEE confirmed ejection fraction of 35 to 40%.  Will check BNP.

## 2022-10-21 NOTE — Plan of Care (Signed)

## 2022-10-21 NOTE — Assessment & Plan Note (Addendum)
On coreg

## 2022-10-21 NOTE — Progress Notes (Addendum)
Pharmacy Antibiotic Note  [name] is a 76 y.o. male admitted on 10/21/2022 with cellulitis.  Pharmacy has been consulted for restarting daptomycin dosing.  Plan: --restart daptomycin 700 mg (8 mg/kg) every 24 hours (1st dose tonight) - CK in am, then weekly CK checks - on atorvastatin 40 mg (dose reduced) while on Daptomycin - Will monitor renal function closely for necessary dose adjustments  Height: '5\' 9"'$  (175.3 cm) Weight: 86.2 kg (190 lb) IBW/kg (Calculated) : 70.7  Temp (24hrs), Avg:99 F (37.2 C), Min:98.2 F (36.8 C), Max:99.7 F (37.6 C)  No results for input(s): "WBC", "CREATININE", "LATICACIDVEN", "VANCOTROUGH", "VANCOPEAK", "VANCORANDOM", "GENTTROUGH", "GENTPEAK", "GENTRANDOM", "TOBRATROUGH", "TOBRAPEAK", "TOBRARND", "AMIKACINPEAK", "AMIKACINTROU", "AMIKACIN" in the last 168 hours.  Estimated Creatinine Clearance: 74.6 mL/min (by C-G formula based on SCr of 0.93 mg/dL).    No Known Allergies  Antimicrobials this admission: 11/25 daptomycin >>   Microbiology results: 11/23 BCx >> 4/4 GPC (BCID MRSA) 11/24 L-knee (OR cx) >> abundant staph aureus 12/11 WCx: pending  Thank you for allowing pharmacy to be a part of this patient's care.  Dallie Piles 10/21/2022 7:00 PM

## 2022-10-21 NOTE — Anesthesia Preprocedure Evaluation (Addendum)
Anesthesia Evaluation  Patient identified by MRN, date of birth, ID band Patient awake    Reviewed: Allergy & Precautions, NPO status , Patient's Chart, lab work & pertinent test results  History of Anesthesia Complications Negative for: history of anesthetic complications  Airway Mallampati: IV   Neck ROM: Full    Dental  (+) Missing   Pulmonary former smoker   Pulmonary exam normal breath sounds clear to auscultation       Cardiovascular hypertension, + CAD (s/p MI and stents on Eliquis), + Peripheral Vascular Disease and +CHF (ischemic cardiomyopathy)  Normal cardiovascular exam Rhythm:Regular Rate:Normal  Hx DVT   Neuro/Psych negative neurological ROS     GI/Hepatic ,GERD  ,,  Endo/Other  diabetes, Type 1, Insulin Dependent    Renal/GU negative Renal ROS     Musculoskeletal  (+) Arthritis ,    Abdominal   Peds  Hematology negative hematology ROS (+)   Anesthesia Other Findings   Reproductive/Obstetrics                             Anesthesia Physical Anesthesia Plan  ASA: 3  Anesthesia Plan: General   Post-op Pain Management:    Induction: Intravenous  PONV Risk Score and Plan: 2 and Ondansetron, Dexamethasone and Treatment may vary due to age or medical condition  Airway Management Planned: LMA  Additional Equipment:   Intra-op Plan:   Post-operative Plan: Extubation in OR  Informed Consent: I have reviewed the patients History and Physical, chart, labs and discussed the procedure including the risks, benefits and alternatives for the proposed anesthesia with the patient or authorized representative who has indicated his/her understanding and acceptance.     Dental advisory given  Plan Discussed with: CRNA  Anesthesia Plan Comments: (Patient consented for risks of anesthesia including but not limited to:  - adverse reactions to medications - damage to eyes,  teeth, lips or other oral mucosa - nerve damage due to positioning  - sore throat or hoarseness - damage to heart, brain, nerves, lungs, other parts of body or loss of life  Informed patient about role of CRNA in peri- and intra-operative care.  Patient voiced understanding.)       Anesthesia Quick Evaluation

## 2022-10-21 NOTE — Assessment & Plan Note (Signed)
Meets criteria BMI greater than 25 

## 2022-10-22 DIAGNOSIS — E1065 Type 1 diabetes mellitus with hyperglycemia: Secondary | ICD-10-CM | POA: Diagnosis not present

## 2022-10-22 DIAGNOSIS — M00062 Staphylococcal arthritis, left knee: Secondary | ICD-10-CM | POA: Diagnosis not present

## 2022-10-22 DIAGNOSIS — I5022 Chronic systolic (congestive) heart failure: Secondary | ICD-10-CM | POA: Diagnosis not present

## 2022-10-22 DIAGNOSIS — E663 Overweight: Secondary | ICD-10-CM | POA: Diagnosis not present

## 2022-10-22 LAB — COMPREHENSIVE METABOLIC PANEL
ALT: 15 U/L (ref 0–44)
AST: 18 U/L (ref 15–41)
Albumin: 1.8 g/dL — ABNORMAL LOW (ref 3.5–5.0)
Alkaline Phosphatase: 109 U/L (ref 38–126)
Anion gap: 6 (ref 5–15)
BUN: 23 mg/dL (ref 8–23)
CO2: 31 mmol/L (ref 22–32)
Calcium: 7.7 mg/dL — ABNORMAL LOW (ref 8.9–10.3)
Chloride: 103 mmol/L (ref 98–111)
Creatinine, Ser: 1.11 mg/dL (ref 0.61–1.24)
GFR, Estimated: >60 mL/min (ref >60)
Glucose, Bld: 156 mg/dL — ABNORMAL HIGH (ref 70–99)
Potassium: 3.7 mmol/L (ref 3.5–5.1)
Sodium: 140 mmol/L (ref 135–145)
Total Bilirubin: 0.7 mg/dL (ref 0.3–1.2)
Total Protein: 5.4 g/dL — ABNORMAL LOW (ref 6.5–8.1)

## 2022-10-22 LAB — GLUCOSE, CAPILLARY
Glucose-Capillary: 157 mg/dL — ABNORMAL HIGH (ref 70–99)
Glucose-Capillary: 210 mg/dL — ABNORMAL HIGH (ref 70–99)
Glucose-Capillary: 209 mg/dL — ABNORMAL HIGH (ref 70–99)
Glucose-Capillary: 209 mg/dL — ABNORMAL HIGH (ref 70–99)

## 2022-10-22 LAB — CBC
HCT: 25.9 % — ABNORMAL LOW (ref 39.0–52.0)
Hemoglobin: 8.3 g/dL — ABNORMAL LOW (ref 13.0–17.0)
MCH: 29.1 pg (ref 26.0–34.0)
MCHC: 32 g/dL (ref 30.0–36.0)
MCV: 90.9 fL (ref 80.0–100.0)
Platelets: 337 10*3/uL (ref 150–400)
RBC: 2.85 MIL/uL — ABNORMAL LOW (ref 4.22–5.81)
RDW: 15.3 % (ref 11.5–15.5)
WBC: 10.5 10*3/uL (ref 4.0–10.5)
nRBC: 0 % (ref 0.0–0.2)

## 2022-10-22 LAB — CK: Total CK: 23 U/L — ABNORMAL LOW (ref 49–397)

## 2022-10-22 LAB — MRSA NEXT GEN BY PCR, NASAL: MRSA by PCR Next Gen: DETECTED — AB

## 2022-10-22 MED ORDER — HYDROMORPHONE HCL 1 MG/ML IJ SOLN: 1.0000 mg | INTRAMUSCULAR | Status: DC | PRN

## 2022-10-22 MED ORDER — FUROSEMIDE 10 MG/ML IJ SOLN
20.0000 mg | Freq: Once | INTRAMUSCULAR | Status: AC
  Administered 2022-10-22: 20 mg via INTRAVENOUS
  Filled 2022-10-22: qty 4

## 2022-10-22 MED ORDER — OXYCODONE HCL 5 MG PO TABS
5.0000 mg | ORAL_TABLET | Freq: Four times a day (QID) | ORAL | Status: DC | PRN
  Administered 2022-10-22 – 2022-11-05 (×6): 5 mg via ORAL
  Filled 2022-10-22 (×9): qty 1

## 2022-10-22 NOTE — Plan of Care (Signed)
  Problem: Nutritional: Goal: Maintenance of adequate nutrition will improve Outcome: Progressing   Problem: Skin Integrity: Goal: Risk for impaired skin integrity will decrease Outcome: Progressing   Problem: Education: Goal: Knowledge of General Education information will improve Description: Including pain rating scale, medication(s)/side effects and non-pharmacologic comfort measures Outcome: Progressing   Problem: Clinical Measurements: Goal: Ability to maintain clinical measurements within normal limits will improve Outcome: Progressing   Problem: Clinical Measurements: Goal: Respiratory complications will improve Outcome: Progressing   Problem: Activity: Goal: Risk for activity intolerance will decrease Outcome: Progressing   Problem: Nutrition: Goal: Adequate nutrition will be maintained Outcome: Progressing

## 2022-10-22 NOTE — Progress Notes (Signed)
[facility] Progress Note  Patient: [name]    STM:[id number]  DOA: 10/21/2022    Date of Service: the patient was seen and examined on 10/22/2022  Brief hospital course: Patient is a 76 year old male with past medical history of type 1 diabetes mellitus on insulin pump, history of left leg DVT on Eliquis, CAD status post angioplasty, bladder cancer and previous history of MRSA infection who presented to the emergency room on 11/22 and was admitted to the hospitalist service for septic arthritis of left knee.  Patient underwent irrigation debridement and partial meniscectomy on 11/23 with repeat irrigation debridement on 11/27 and was placed on IV daptomycin for 6 weeks.  He was discharged on 12/4 to skilled nursing.  Patient was seen in the orthopedic office on the morning of 12/11 for suture removal and noted to have purulent drainage coming from the wound.  Orthopedics  took patient to the OR on 12/11 afternoon for repeat arthroscopic debridement and hospitalists were contacted for admission.   Assessment and Plan: * Septic arthritis of knee, left [facility]) Appreciate orthopedic intervention.  Status post repeat arthroscopic debridement today.  Continue IV daptomycin.  Chronic systolic CHF (congestive heart failure) ([place]) At patient's last hospitalization, 2D echo and TEE confirmed ejection fraction of 35 to 40%.  BNP minimally elevated, will give 1 dose of IV Lasix.  Uncontrolled type 1 diabetes mellitus with hyperglycemia, with long-term current use of insulin ([name]) Initially n.p.o. before surgery.  Resume home medications and sliding scale  DVT (deep venous thrombosis) ([name]) Normally on Eliquis.  Resume once cleared by orthopedic surgery.  HYPERTENSION, BENIGN Stable.,  Secondary to anesthesia.  Will resume home medications when blood pressure starts to rise  Chronic constipation Patient has issues with chronic constipation.  He states he has not had a bowel movement  since the day he discharged a week ago.  Will start some Colace and lactulose.  Overweight (BMI 25.0-29.9) Meets criteria BMI greater than 25       Body mass index is 28.06 kg/m.    Consultants: Infectious disease Orthopedic surgery  Procedures: Irrigation and debridement of left knee 12/11  Antimicrobials: Continued on IV daptomycin (today is day 25/42)  Code Status: Full code    Subjective: Mild knee pain, tolerable with pain medication  Objective: Vital signs were reviewed and unremarkable. Vitals:   10/21/22 2319 10/22/22 0809  BP: (!) 110/53 (!) 118/55  Pulse: 87 91  Resp: 17 17  Temp: 98 F (36.7 C) 98 F (36.7 C)  SpO2: 94% 100%    Intake/Output Summary (Last 24 hours) at 10/22/2022 1601 Last data filed at 10/22/2022 1238 Gross per 24 hour  Intake 1351 ml  Output 325 ml  Net 1026 ml   Filed Weights   10/21/22 1454  Weight: 86.2 kg   Body mass index is 28.06 kg/m.  Exam:  General: Alert and oriented x 3, no acute distress HEENT: Normocephalic, atraumatic, mucous membranes are moist Cardiovascular: Regular rate and rhythm, S1-S2, 2 out of 6 systolic ejection murmur Respiratory: Clear to auscultation bilaterally soft Abdomen:, Nontender, nondistended, positive bowel sounds Musculoskeletal: Left knee with drain, minimal drainage Skin: No skin breaks, tears or lesions other than that surgery site Psychiatry: Appropriate, no evidence of psychoses Neurology: No focal visits  Data Reviewed: Albumin of 1.8, hemoglobin of 8.3, BNP of 341, procalcitonin of 0.15  Disposition:  Status is: Inpatient Remains inpatient appropriate because:  -Decision if further debridement needed    Anticipated discharge date: 12/14  Family Communication: Will call family DVT Prophylaxis: SCDs Start: 10/21/22 1836 apixaban (ELIQUIS) tablet 5 mg    Author: Annita Brod ,MD 10/22/2022 4:01 PM  To reach On-call, see care teams to locate the attending  and reach out via [email or web address]. Between 7PM-7AM, please contact night-coverage If you still have difficulty reaching the attending provider, please page the [facility] (Director on Call) for [facility] on amion for assistance.
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
## 2022-10-23 ENCOUNTER — Encounter: Payer: Self-pay | Admitting: Surgery

## 2022-10-23 DIAGNOSIS — M009 Pyogenic arthritis, unspecified: Secondary | ICD-10-CM

## 2022-10-23 DIAGNOSIS — I5022 Chronic systolic (congestive) heart failure: Secondary | ICD-10-CM | POA: Diagnosis not present

## 2022-10-23 DIAGNOSIS — I82452 Acute embolism and thrombosis of left peroneal vein: Secondary | ICD-10-CM | POA: Diagnosis not present

## 2022-10-23 DIAGNOSIS — I10 Essential (primary) hypertension: Secondary | ICD-10-CM

## 2022-10-23 DIAGNOSIS — E1065 Type 1 diabetes mellitus with hyperglycemia: Secondary | ICD-10-CM | POA: Diagnosis not present

## 2022-10-23 LAB — GLUCOSE, CAPILLARY
Glucose-Capillary: 193 mg/dL — ABNORMAL HIGH (ref 70–99)
Glucose-Capillary: 227 mg/dL — ABNORMAL HIGH (ref 70–99)
Glucose-Capillary: 288 mg/dL — ABNORMAL HIGH (ref 70–99)
Glucose-Capillary: 268 mg/dL — ABNORMAL HIGH (ref 70–99)

## 2022-10-23 LAB — CBC
HCT: 25.9 % — ABNORMAL LOW (ref 39.0–52.0)
Hemoglobin: 8.5 g/dL — ABNORMAL LOW (ref 13.0–17.0)
MCH: 29.3 pg (ref 26.0–34.0)
MCHC: 32.8 g/dL (ref 30.0–36.0)
MCV: 89.3 fL (ref 80.0–100.0)
Platelets: 356 10*3/uL (ref 150–400)
RBC: 2.9 MIL/uL — ABNORMAL LOW (ref 4.22–5.81)
RDW: 14.9 % (ref 11.5–15.5)
WBC: 11.9 10*3/uL — ABNORMAL HIGH (ref 4.0–10.5)
nRBC: 0 % (ref 0.0–0.2)

## 2022-10-23 LAB — BASIC METABOLIC PANEL
Anion gap: 7 (ref 5–15)
BUN: 23 mg/dL (ref 8–23)
CO2: 33 mmol/L — ABNORMAL HIGH (ref 22–32)
Calcium: 7.9 mg/dL — ABNORMAL LOW (ref 8.9–10.3)
Chloride: 100 mmol/L (ref 98–111)
Creatinine, Ser: 1.2 mg/dL (ref 0.61–1.24)
GFR, Estimated: >60 mL/min (ref >60)
Glucose, Bld: 192 mg/dL — ABNORMAL HIGH (ref 70–99)
Potassium: 3.5 mmol/L (ref 3.5–5.1)
Sodium: 140 mmol/L (ref 135–145)
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
## 2022-10-23 NOTE — Plan of Care (Signed)
  Problem: Education: Goal: Individualized Educational Video(s) Outcome: Progressing   Problem: Fluid Volume: Goal: Ability to maintain a balanced intake and output will improve Outcome: Progressing   Problem: Health Behavior/Discharge Planning: Goal: Ability to identify and utilize available resources and services will improve Outcome: Progressing   Problem: Skin Integrity: Goal: Risk for impaired skin integrity will decrease Outcome: Progressing   Problem: Tissue Perfusion: Goal: Adequacy of tissue perfusion will improve Outcome: Progressing   Problem: Clinical Measurements: Goal: Cardiovascular complication will be avoided Outcome: Progressing   Problem: Pain Managment: Goal: General experience of comfort will improve Outcome: Progressing   Problem: Skin Integrity: Goal: Risk for impaired skin integrity will decrease Outcome: Progressing

## 2022-10-23 NOTE — Evaluation (Addendum)
Occupational Therapy Evaluation Patient Details Name: [name] MRN: [id number] DOB: [birth year] Today's Date: 10/23/2022   History of Present Illness Patient here with septic left knee, s/p I & D. WBAT. Here recently for same.   Clinical Impression   Pt agreeable to OT/PT co-evaluation to maximize safety and participation. Pt currently functioning at Mod I for bed mobility, Min guard to stand from EOB, and Min guard for functional mobility at room level using RW. Pt reported 4/10 pain in L knee throughout (RN aware). Pt will benefit from acute OT to increase overall independence in the areas of ADLs and functional mobility in order to safely discharge to next venue of care. Upon hospital discharge, recommend STR to maximize pt safety and return to PLOF.    Recommendations for follow up therapy are one component of a multi-disciplinary discharge planning process, led by the attending physician.  Recommendations may be updated based on patient status, additional functional criteria and insurance authorization.   Follow Up Recommendations  Skilled nursing-short term rehab (<3 hours/day)     Assistance Recommended at Discharge Frequent or constant Supervision/Assistance  Patient can return home with the following A lot of help with bathing/dressing/bathroom;A little help with walking and/or transfers;Assistance with cooking/housework;Help with stairs or ramp for entrance;Assist for transportation;Direct supervision/assist for financial management;Direct supervision/assist for medications management    Functional Status Assessment  Patient has had a recent decline in their functional status and demonstrates the ability to make significant improvements in function in a reasonable and predictable amount of time.  Equipment Recommendations  Other (comment) (defer to next venue of care)    Recommendations for Other Services       Precautions / Restrictions Precautions Precautions:  Fall Restrictions Weight Bearing Restrictions: Yes LLE Weight Bearing: Weight bearing as tolerated      Mobility Bed Mobility Overal bed mobility: Needs Assistance Bed Mobility: Supine to Sit     Supine to sit: Modified independent (Device/Increase time), Supervision     General bed mobility comments: Reported mild dizziness upon sitting and upon initial standing resolved within 1 min. No reports of dizziness once moving.    Transfers Overall transfer level: Needs assistance Equipment used: Rolling walker (2 wheels) Transfers: Sit to/from Stand Sit to Stand: Min guard           General transfer comment: cues for hand placement      Balance Overall balance assessment: Needs assistance Sitting-balance support: Feet supported Sitting balance-Leahy Scale: Good     Standing balance support: Bilateral upper extremity supported, During functional activity, Reliant on assistive device for balance Standing balance-Leahy Scale: Fair                             ADL either performed or assessed with clinical judgement   ADL Overall ADL's : Needs assistance/impaired                     Lower Body Dressing: Moderate assistance;Sit to/from stand Lower Body Dressing Details (indicate cue type and reason): anticipate Toilet Transfer: Min guard;Rolling walker (2 wheels) Toilet Transfer Details (indicate cue type and reason): simulated with STS from EOB         Functional mobility during ADLs: Min guard;Rolling walker (2 wheels) (at room level)       Vision Patient Visual Report: No change from baseline       Perception     Praxis  Pertinent Vitals/Pain Pain Assessment Pain Assessment: 0-10 Pain Score: 4  Pain Location: L knee Pain Descriptors / Indicators: Discomfort, Sore Pain Intervention(s): Monitored during session, Repositioned     Hand Dominance Right   Extremity/Trunk Assessment Upper Extremity Assessment Upper Extremity  Assessment: Generalized weakness   Lower Extremity Assessment Lower Extremity Assessment: Generalized weakness LLE Deficits / Details: ROM WFL. Painful with weight bearing   Cervical / Trunk Assessment Cervical / Trunk Assessment: Normal   Communication Communication Communication: No difficulties   Cognition Arousal/Alertness: Awake/alert Behavior During Therapy: WFL for tasks assessed/performed Overall Cognitive Status: Within Functional Limits for tasks assessed                                       General Comments       Exercises Other Exercises Other Exercises: OT provided education re: role of OT, OT POC, post acute recs, sitting up for all meals, EOB/OOB mobility with assistance, home/fall safety.     Shoulder Instructions      Home Living Family/patient expects to be discharged to:: Skilled nursing facility Living Arrangements: Spouse/significant other Available Help at Discharge: Family;Available PRN/intermittently Type of Home: House Home Access: Stairs to enter [employer] of Steps: 6 Entrance Stairs-Rails: Right Home Layout: Two level;Able to live on main level with bedroom/bathroom     Bathroom Shower/Tub: Tub/shower unit         Home Equipment: Cane - single point          Prior Functioning/Environment Prior Level of Function : Independent/Modified Independent             Mobility Comments: household and community mobilization without assist device; endorses recent use of SPC due to progressive L knee pain.  Denies fall history PTA. Patient coming here now from SNF. ADLs Comments: Pt reports Ind with self care and some IADL tasks shared with partner; recently at SNF pt reports requiring assistance for pulling up pants in standing, was using AE for LB dressing (reacher, sock aid)        OT Problem List: Decreased strength;Decreased activity tolerance;Decreased safety awareness;Impaired balance (sitting and/or  standing);Pain;Decreased knowledge of use of DME or AE      OT Treatment/Interventions: Self-care/ADL training;Therapeutic exercise;Therapeutic activities;Energy conservation;DME and/or AE instruction;Patient/family education;Balance training    OT Goals(Current goals can be found in the care plan section) Acute Rehab OT Goals Patient Stated Goal: to get stronger and return to rehab OT Goal Formulation: With patient Time For Goal Achievement: 11/06/22 Potential to Achieve Goals: Good   OT Frequency: Min 2X/week    Co-evaluation Yes Reason for Co-Treatment: For patient/therapist safety; To address functional/ADL transfers PT goals addressed during session: Mobility/safety with mobility OT goals addressed during session: ADL's and self-care            AM-PAC OT "6 Clicks" Daily Activity     Outcome Measure Help from another person eating meals?: None Help from another person taking care of personal grooming?: None Help from another person toileting, which includes using toliet, bedpan, or urinal?: A Lot Help from another person bathing (including washing, rinsing, drying)?: A Lot Help from another person to put on and taking off regular upper body clothing?: A Little Help from another person to put on and taking off regular lower body clothing?: A Lot 6 Click Score: 17   End of Session Equipment Utilized During Treatment: Rolling walker (2 wheels);Gait belt  Nurse Communication: Mobility status  Activity Tolerance: Patient tolerated treatment well;Patient limited by pain Patient left: in chair;with call bell/phone within reach;with chair alarm set  OT Visit Diagnosis: Unsteadiness on feet (R26.81);Repeated falls (R29.6);Muscle weakness (generalized) (M62.81);Pain Pain - Right/Left: Left Pain - part of body: Knee                Time: 0254-8628 OT Time Calculation (min): 15 min Charges:  OT General Charges $OT Visit: 1 Visit OT Evaluation $OT Eval Low Complexity: 1  Low  [facility] MS, OTR/L ascom [phone]  10/23/22, 1:27 PM

## 2022-10-23 NOTE — Evaluation (Signed)
Physical Therapy Evaluation Patient Details Name: [name] MRN: [id number] DOB: 1945-12-21 Today's Date: 10/23/2022  History of Present Illness  Patient here with septic left knee, s/p I & D. WBAT. Here recently for same.  Clinical Impression  Patient received in bed, head covered with blanket. He reports he needs a procedure today- Hemovac to be removed. Educated that doing that usually occurs at bedside. He is agreeable to PT assessment. States he has been up to bathroom with nursing. Patient is mod I with bed mobility. Min guard for sit to stand and min guard for ambulation in room ~40 feet with RW, WBAT L LE. Patient is pain limited with left knee and back 4/10. He will continue to benefit from skilled PT to improve functional independence and safety with mobility.         Recommendations for follow up therapy are one component of a multi-disciplinary discharge planning process, led by the attending physician.  Recommendations may be updated based on patient status, additional functional criteria and insurance authorization.  Follow Up Recommendations Skilled nursing-short term rehab (<3 hours/day) Can patient physically be transported by private vehicle: Yes    Assistance Recommended at Discharge Frequent or constant Supervision/Assistance  Patient can return home with the following  A little help with walking and/or transfers;Assistance with cooking/housework;Assist for transportation;Help with stairs or ramp for entrance;A little help with bathing/dressing/bathroom;Direct supervision/assist for medications management    Equipment Recommendations None recommended by PT  Recommendations for Other Services       Functional Status Assessment Patient has had a recent decline in their functional status and demonstrates the ability to make significant improvements in function in a reasonable and predictable amount of time.     Precautions / Restrictions  Precautions Precautions: Fall Restrictions Weight Bearing Restrictions: Yes LLE Weight Bearing: Weight bearing as tolerated      Mobility  Bed Mobility Overal bed mobility: Needs Assistance Bed Mobility: Supine to Sit     Supine to sit: Modified independent (Device/Increase time), Supervision     General bed mobility comments: Reported mild dizziness upon sitting and upon initial standing resolved within 1 min. No reports of dizziness once moving.    Transfers Overall transfer level: Needs assistance Equipment used: Rolling walker (2 wheels) Transfers: Sit to/from Stand Sit to Stand: Min guard           General transfer comment: cues for hand placement    Ambulation/Gait Ambulation/Gait assistance: Min guard Gait Distance (Feet): 40 Feet Assistive device: Rolling walker (2 wheels) Gait Pattern/deviations: Step-through pattern, Antalgic, Trunk flexed Gait velocity: decreased     General Gait Details: Mobility limited by pain in left knee and back  Stairs            Wheelchair Mobility    Modified Rankin (Stroke Patients Only)       Balance Overall balance assessment: Needs assistance Sitting-balance support: Feet supported Sitting balance-Leahy Scale: Good     Standing balance support: Bilateral upper extremity supported, During functional activity, Reliant on assistive device for balance Standing balance-Leahy Scale: Fair Standing balance comment: reliant on RW for dynamic standing activity                             Pertinent Vitals/Pain Pain Assessment Pain Assessment: 0-10 Pain Score: 4  Pain Location: L knee Pain Descriptors / Indicators: Discomfort, Sore Pain Intervention(s): Monitored during session, Repositioned    Home Living Family/patient expects to  be discharged to:: Skilled nursing facility Living Arrangements: Spouse/significant other Available Help at Discharge: Family;Available PRN/intermittently Type of Home:  House Home Access: Stairs to enter Entrance Stairs-Rails: Right Entrance Stairs-Number of Steps: 6   Home Layout: Two level;Able to live on main level with bedroom/bathroom Home Equipment: Kasandra Knudsen - single point      Prior Function Prior Level of Function : Independent/Modified Independent             Mobility Comments: household and community mobilization without assist device; endorses recent use of SPC due to progressive L knee pain.  Denies fall history PTA. Patient coming here now from SNF. ADLs Comments: Pt reports Ind with self care and some IADL tasks shared with partner     Hand Dominance   Dominant Hand: Right    Extremity/Trunk Assessment   Upper Extremity Assessment Upper Extremity Assessment: Defer to OT evaluation    Lower Extremity Assessment Lower Extremity Assessment: LLE deficits/detail LLE Deficits / Details: ROM WFL. Painful with weight bearing    Cervical / Trunk Assessment Cervical / Trunk Assessment: Normal  Communication   Communication: No difficulties  Cognition Arousal/Alertness: Awake/alert Behavior During Therapy: WFL for tasks assessed/performed Overall Cognitive Status: Within Functional Limits for tasks assessed Area of Impairment: Safety/judgement, Problem solving                   Current Attention Level: Focused   Following Commands: Follows one step commands consistently, Follows one step commands with increased time Safety/Judgement: Decreased awareness of safety Awareness: Intellectual Problem Solving: Slow processing, Difficulty sequencing, Requires verbal cues General Comments: Patient requires cues and increased time with mobility. Reports back pain and left knee pain 4/10        General Comments      Exercises     Assessment/Plan    PT Assessment Patient needs continued PT services  PT Problem List Decreased strength;Decreased range of motion;Decreased activity tolerance;Decreased balance;Decreased  mobility;Decreased knowledge of use of DME;Decreased safety awareness;Pain;Decreased cognition;Decreased skin integrity       PT Treatment Interventions DME instruction;Gait training;Stair training;Functional mobility training;Therapeutic activities;Therapeutic exercise;Balance training;Patient/family education    PT Goals (Current goals can be found in the Care Plan section)  Acute Rehab PT Goals Patient Stated Goal: rehab then home." PT Goal Formulation: With patient Time For Goal Achievement: 11/06/22 Potential to Achieve Goals: Good    Frequency 7X/week     Co-evaluation               AM-PAC PT "6 Clicks" Mobility  Outcome Measure Help needed turning from your back to your side while in a flat bed without using bedrails?: None Help needed moving from lying on your back to sitting on the side of a flat bed without using bedrails?: None Help needed moving to and from a bed to a chair (including a wheelchair)?: A Little Help needed standing up from a chair using your arms (e.g., wheelchair or bedside chair)?: A Little Help needed to walk in hospital room?: A Little Help needed climbing 3-5 steps with a railing? : A Lot 6 Click Score: 19    End of Session Equipment Utilized During Treatment: Gait belt Activity Tolerance: Patient tolerated treatment well;Patient limited by pain Patient left: in chair;with call bell/phone within reach Nurse Communication: Mobility status PT Visit Diagnosis: Muscle weakness (generalized) (M62.81);Difficulty in walking, not elsewhere classified (R26.2);Pain Pain - Right/Left: Left Pain - part of body: Knee (and back)    Time: 3818-2993 PT Time Calculation (min) (  ACUTE ONLY): 17 min   Charges:   PT Evaluation $PT Eval Low Complexity: 1 Low          Alissia Lory, PT, GCS 10/23/22,11:43 AM

## 2022-10-23 NOTE — Progress Notes (Signed)
PROGRESS NOTE    [name]   ZOX:[id number] DOB: 1946-03-20  DOA: 10/21/2022 Date of Service: 10/23/22 PCP: [name], MD     Brief Narrative / Hospital Course:  Patient is a 76 year old male with past medical history of type 1 diabetes mellitus on insulin pump, history of left leg DVT on Eliquis, CAD status post angioplasty, bladder cancer and previous history of MRSA infection who presented to the emergency room on 11/22 and was admitted to the hospitalist service for recurrent septic arthritis of left knee.    Patient underwent irrigation debridement and partial meniscectomy on 11/23 with repeat irrigation debridement on 11/27 and was placed on IV daptomycin for 6 weeks.  He was discharged on 12/4 to skilled nursing.    Patient was seen in the orthopedic office on the morning of 12/11 for suture removal and noted to have purulent drainage coming from the wound.  Orthopedics  took patient to the OR on 12/11 afternoon for repeat arthroscopic debridement and hospitalists were contacted for admission. Hemovac drain removed 10/23/22, possible need for additional washout on 10/24/22. Cultures showing few staph aureus.  Gram stain with rare gram + cocci in singles - pending s/s.  Patient is currently on Daptomycin.     Consultants:  Orthopedic surgery Infectious disease   Procedures: 10/21/22 Arthroscopic irrigation debridement of left knee (Dr [name] - ortho surgery)      ASSESSMENT & PLAN:   Principal Problem:   Septic arthritis of knee, left ([place]) Active Problems:   Chronic systolic CHF (congestive heart failure) ([place])   Uncontrolled type 1 diabetes mellitus with hyperglycemia, with long-term current use of insulin ([name])   DVT (deep venous thrombosis) ([name])   HYPERTENSION, BENIGN   Chronic constipation   Overweight (BMI 25.0-29.9)   Septic arthritis of knee, left [facility]) Appreciate orthopedic intervention.   Status post repeat arthroscopic debridement  10/21/22.   Continue IV daptomycin pending final cultures - ID following    Chronic systolic CHF (congestive heart failure) ([place]) At patient's last hospitalization, 2D echo and TEE confirmed ejection fraction of 35 to 40%.  BNP minimally elevated, has received 1 dose of IV Lasix on this admission    Uncontrolled type 1 diabetes mellitus with hyperglycemia, with long-term current use of insulin ([name]) Resume home medications and sliding scale   DVT (deep venous thrombosis) ([place]) Normally on Eliquis.     HYPERTENSION, BENIGN Stable. Hypotension secondary to anesthesia.   Will resume home medications when blood pressure starts to rise   Chronic constipation Patient has issues with chronic constipation.   He states he has not had a bowel movement since the day he discharged a week prior to this admission.   Will start some Colace and lactulose.   Overweight (BMI 25.0-29.9) Meets criteria BMI greater than 25    DVT prophylaxis: eliquis Pertinent IV fluids/nutrition: no continuous IV fluids Central lines / invasive devices: none  Code Status: FULL CODE  Disposition: inpatient TOC needs: PT?OT pending, expect will need HH  Barriers to discharge / significant pending items: penning ortho recs, possible washout again tomorrow              Subjective:  Patient reports no concerns Denies CP/SOB.  Pain controlled.  Denies new weakness.  Tolerating diet.  Reports no concerns w/ urination/defecation.   Family Communication: family at bedside on rounds     Objective Findings:  Vitals:   10/22/22 0809 10/22/22 1610 10/22/22 2332 10/23/22 0815  BP: (!) 118/55 125/64  132/64 (!) 113/54  Pulse: 91 84 100 83  Resp: '17 17 17 18  '$ Temp: 98 F (36.7 C) 98 F (36.7 C) 98.7 F (37.1 C) 98.2 F (36.8 C)  TempSrc:      SpO2: 100% 93% 94% 90%  Weight:      Height:        Intake/Output Summary (Last 24 hours) at 10/23/2022 1603 Last data filed at 10/23/2022 0034 Gross  per 24 hour  Intake 64 ml  Output 110 ml  Net -46 ml   Filed Weights   10/21/22 1454  Weight: 86.2 kg    Examination:  Constitutional:  VS as above General Appearance: alert, NAD Respiratory: Normal respiratory effort No wheeze No rhonchi No rales Cardiovascular: S1/S2 normal No rub/gallop auscultated Gastrointestinal: No tenderness Musculoskeletal:  Symmetrical movement in all extremities Neurological: No cranial nerve deficit on limited exam Alert Psychiatric: Normal judgment/insight Normal mood and affect       Scheduled Medications:   apixaban  5 mg Oral BID   aspirin EC  81 mg Oral Daily   atorvastatin  40 mg Oral Daily   carvedilol  6.25 mg Oral BID WC   Chlorhexidine Gluconate Cloth  6 each Topical Daily   docusate sodium  100 mg Oral BID   furosemide  40 mg Oral Daily   insulin aspart  0-5 Units Subcutaneous QHS   insulin aspart  0-9 Units Subcutaneous TID WC   insulin glargine-yfgn  28 Units Subcutaneous Daily   lactulose  20 g Oral Daily   multivitamin with minerals  1 tablet Oral Daily   pantoprazole  40 mg Oral Daily   sodium chloride flush  10-40 mL Intracatheter Q12H    Continuous Infusions:  sodium chloride     DAPTOmycin (CUBICIN) 700 mg in sodium chloride 0.9 % IVPB Stopped (10/22/22 2306)    PRN Medications:  acetaminophen **OR** acetaminophen, diphenhydrAMINE, HYDROmorphone (DILAUDID) injection, hyoscyamine, ondansetron **OR** ondansetron (ZOFRAN) IV, oxyCODONE, sodium chloride flush, Uribel  Antimicrobials:  Anti-infectives (From admission, onward)    Start     Dose/Rate Route Frequency Ordered Stop   10/21/22 2200  DAPTOmycin (CUBICIN) 700 mg in sodium chloride 0.9 % IVPB        700 mg 128 mL/hr over 30 Minutes Intravenous Every 24 hours 10/21/22 1854     10/21/22 1930  daptomycin (CUBICIN) IVPB  Status:  Discontinued        700 mg Intravenous Every 24 hours 10/21/22 1835 10/21/22 1854   10/21/22 1835  Uribel 118 MG CAPS 118  mg        1 capsule Oral Every 6 hours PRN 10/21/22 1835     10/21/22 1447  ceFAZolin (ANCEF) IVPB 2g/100 mL premix        2 g 200 mL/hr over 30 Minutes Intravenous 60 min pre-op 10/21/22 1445 10/21/22 1557   10/21/22 1447  ceFAZolin (ANCEF) 2-4 GM/100ML-% IVPB       Note to Pharmacy: Norton Blizzard A: cabinet override      10/21/22 1447 10/21/22 1543           Data Reviewed: I have personally reviewed following labs and imaging studies  CBC: Recent Labs  Lab 10/21/22 2141 10/22/22 0515 10/23/22 0530  WBC 10.6* 10.5 11.9*  HGB 9.5* 8.3* 8.5*  HCT 29.7* 25.9* 25.9*  MCV 91.7 90.9 89.3  PLT 348 337 917   Basic Metabolic Panel: Recent Labs  Lab 10/21/22 2141 10/22/22 0515 10/23/22 0530  NA 139 140  140  K 3.6 3.7 3.5  CL 101 103 100  CO2 31 31 33*  GLUCOSE 154* 156* 192*  BUN '22 23 23  '$ CREATININE 1.08 1.11 1.20  CALCIUM 7.8* 7.7* 7.9*   GFR: Estimated Creatinine Clearance: 57.9 mL/min (by C-G formula based on SCr of 1.2 mg/dL). Liver Function Tests: Recent Labs  Lab 10/21/22 2141 10/22/22 0515  AST 20 18  ALT 18 15  ALKPHOS 113 109  BILITOT 0.5 0.7  PROT 5.8* 5.4*  ALBUMIN 2.0* 1.8*   No results for input(s): "LIPASE", "AMYLASE" in the last 168 hours. No results for input(s): "AMMONIA" in the last 168 hours. Coagulation Profile: No results for input(s): "INR", "PROTIME" in the last 168 hours. Cardiac Enzymes: Recent Labs  Lab 10/22/22 0515  CKTOTAL 23*   BNP (last 3 results) No results for input(s): "PROBNP" in the last 8760 hours. HbA1C: No results for input(s): "HGBA1C" in the last 72 hours. CBG: Recent Labs  Lab 10/22/22 1203 10/22/22 1716 10/22/22 2101 10/23/22 0815 10/23/22 1200  GLUCAP 210* 209* 209* 193* 227*   Lipid Profile: No results for input(s): "CHOL", "HDL", "LDLCALC", "TRIG", "CHOLHDL", "LDLDIRECT" in the last 72 hours. Thyroid Function Tests: No results for input(s): "TSH", "T4TOTAL", "FREET4", "T3FREE", "THYROIDAB"  in the last 72 hours. Anemia Panel: No results for input(s): "VITAMINB12", "FOLATE", "FERRITIN", "TIBC", "IRON", "RETICCTPCT" in the last 72 hours. Most Recent Urinalysis On File:     Component Value Date/Time   COLORURINE YELLOW (A) 10/03/2022 0906   APPEARANCEUR HAZY (A) 10/03/2022 0906   APPEARANCEUR Clear 06/08/2015 1343   LABSPEC 1.021 10/03/2022 0906   PHURINE 5.0 10/03/2022 0906   GLUCOSEU >=500 (A) 10/03/2022 0906   HGBUR MODERATE (A) 10/03/2022 0906   BILIRUBINUR NEGATIVE 10/03/2022 0906   BILIRUBINUR Negative 06/08/2015 1343   KETONESUR 80 (A) 10/03/2022 0906   PROTEINUR 30 (A) 10/03/2022 0906   NITRITE NEGATIVE 10/03/2022 0906   LEUKOCYTESUR NEGATIVE 10/03/2022 0906   Sepsis Labs: '@LABRCNTIP'$ (procalcitonin:4,lacticidven:4)  Recent Results (from the past 240 hour(s))  Aerobic/Anaerobic Culture w Gram Stain (surgical/deep wound)     Status: None (Preliminary result)   Collection Time: 10/21/22  4:01 PM   Specimen: PATH Cytology Misc. fluid; Body Fluid  Result Value Ref Range Status   Specimen Description   Final    WOUND Performed at [facility], [address], [place], [place] [address]    Special Requests   Final    NONE Performed at [facility], [place], [place] [address]    Gram Stain   Final    FEW WBC PRESENT,BOTH PMN AND MONONUCLEAR RARE GRAM POSITIVE COCCI IN SINGLES Performed at [facility], [place] [address], [place], [place] [address]    Culture   Final    FEW STAPHYLOCOCCUS AUREUS SUSCEPTIBILITIES TO FOLLOW NO ANAEROBES ISOLATED; CULTURE IN PROGRESS FOR 5 DAYS    Report Status PENDING  Incomplete  MRSA Next Gen by PCR, Nasal     Status: Abnormal   Collection Time: 10/22/22 10:50 AM   Specimen: Nasal Mucosa; Nasal Swab  Result Value Ref Range Status   MRSA by PCR Next Gen DETECTED (A) NOT DETECTED Final    Comment: CRITICAL RESULT CALLED TO, READ BACK BY AND VERIFIED WITH: RN Raffael COLLIER AT 1948  10/22/2022 GAA (NOTE) The GeneXpert MRSA Assay (FDA approved for NASAL specimens only), is one component of a comprehensive MRSA colonization surveillance program. It is not intended to diagnose MRSA infection nor to  guide or monitor treatment for MRSA infections. Test performance is not FDA approved in patients less than 5 years old. Performed at [facility], [address] [place], [place] [address]          Radiology Studies: No results found.          LOS: 2 days      Emeterio Reeve, DO [facility] 10/23/2022, 4:03 PM    Dictation software may have been used to generate the above note. Typos may occur and escape review in typed/dictated notes. Please contact Dr Sheppard Coil directly for clarity if needed.  Staff may message me via secure chat in [place]  but this may not receive an immediate response,  please page me for urgent matters!  If 7PM-7AM, please contact night coverage [email or web address]      .

## 2022-10-23 NOTE — Consult Note (Signed)
I have placed a request via Secure Chat to Dr. Sheppard Coil and Dr. [name]  requesting photos of the wound areas of concern to be placed in the EMR.    [place] nurse are managing patients remotely at times based on staffing.   [place], [place], [place]

## 2022-10-24 LAB — BASIC METABOLIC PANEL
Anion gap: 5 (ref 5–15)
BUN: 22 mg/dL (ref 8–23)
CO2: 33 mmol/L — ABNORMAL HIGH (ref 22–32)
Calcium: 7.7 mg/dL — ABNORMAL LOW (ref 8.9–10.3)
Chloride: 101 mmol/L (ref 98–111)
Creatinine, Ser: 1.08 mg/dL (ref 0.61–1.24)
GFR, Estimated: >60 mL/min (ref >60)
Glucose, Bld: 211 mg/dL — ABNORMAL HIGH (ref 70–99)
Potassium: 3.3 mmol/L — ABNORMAL LOW (ref 3.5–5.1)
Sodium: 139 mmol/L (ref 135–145)

## 2022-10-24 LAB — CBC
HCT: 24.3 % — ABNORMAL LOW (ref 39.0–52.0)
Hemoglobin: 8 g/dL — ABNORMAL LOW (ref 13.0–17.0)
MCH: 29.1 pg (ref 26.0–34.0)
MCHC: 32.9 g/dL (ref 30.0–36.0)
MCV: 88.4 fL (ref 80.0–100.0)
Platelets: 346 10*3/uL (ref 150–400)
RBC: 2.75 MIL/uL — ABNORMAL LOW (ref 4.22–5.81)
RDW: 14.8 % (ref 11.5–15.5)
WBC: 11.9 10*3/uL — ABNORMAL HIGH (ref 4.0–10.5)
nRBC: 0 % (ref 0.0–0.2)

## 2022-10-24 LAB — GLUCOSE, CAPILLARY
Glucose-Capillary: 194 mg/dL — ABNORMAL HIGH (ref 70–99)
Glucose-Capillary: 253 mg/dL — ABNORMAL HIGH (ref 70–99)
Glucose-Capillary: 241 mg/dL — ABNORMAL HIGH (ref 70–99)
Glucose-Capillary: 226 mg/dL — ABNORMAL HIGH (ref 70–99)

## 2022-10-24 MED ORDER — ORAL CARE MOUTH RINSE: 15.0000 mL | OROMUCOSAL | Status: DC | PRN

## 2022-10-24 NOTE — Plan of Care (Signed)

## 2022-10-24 NOTE — NC FL2 (Signed)
[place] LEVEL OF CARE FORM     IDENTIFICATION  Patient Name: [name] Birthdate: 1946-03-28 Sex: male Admission Date (Current Location): 10/21/2022  [facility] and [place] Number:  Engineering geologist and Address:  [facility], [address], [place], [place] [address]      Provider Number: [id number]  Attending Physician Name and Address:  Emeterio Reeve, DO  Relative Name and Phone Number:  Kathrene Bongo S/O    Current Level of Care: Hospital Recommended Level of Care: [place] Prior Approval Number:    Date Approved/Denied:   PASRR Number: [id number]  Discharge Plan: SNF    Current Diagnoses: Patient Active Problem List   Diagnosis Date Noted   Chronic systolic CHF (congestive heart failure) ([place]) 10/21/2022   Overweight (BMI 25.0-29.9) 10/21/2022   Chronic constipation 10/21/2022   Acute combined systolic and diastolic heart failure ([place]) 10/12/2022   Reactive thrombocytosis 10/10/2022   Orthostatic hypotension 10/10/2022   CAD in native artery 10/06/2022   MRSA bacteremia 10/04/2022   Septic arthritis of knee, left ([place]) 10/03/2022   DVT (deep venous thrombosis) ([place]) 10/03/2022   Uncontrolled type 1 diabetes mellitus with hyperglycemia, with long-term current use of insulin ([place]) 10/03/2022   DKA (diabetic ketoacidoses) 12/16/2018   Arteriosclerosis of coronary artery 06/08/2015   Essential hypertension 06/08/2015   Back pain 02/23/2015   Diaphoresis 01/02/2012   Type 1 diabetes mellitus without complication ([place]) 26/83/4196   Hyperlipidemia 12/27/2010   HYPERTENSION, BENIGN 12/27/2010   CAD, NATIVE VESSEL 12/27/2010   EDEMA 12/27/2010    Orientation RESPIRATION BLADDER Height & Weight     Self, Time, Situation, Place  Normal Continent Weight: 86.2 kg Height:  '5\' 9"'$  (175.3 cm)  BEHAVIORAL SYMPTOMS/MOOD NEUROLOGICAL BOWEL NUTRITION STATUS      Continent Diet (See dc summary)   AMBULATORY STATUS COMMUNICATION OF NEEDS Skin   Extensive Assist Verbally Normal, Surgical wounds                       Personal Care Assistance Level of Assistance  Bathing, Feeding, Dressing Bathing Assistance: Limited assistance Feeding assistance: Limited assistance Dressing Assistance: Limited assistance     Functional Limitations Info  Sight, Hearing, Speech Sight Info: Adequate Hearing Info: Adequate Speech Info: Adequate    SPECIAL CARE FACTORS FREQUENCY  PT (By licensed PT), OT (By licensed OT) (IV ABX)     PT Frequency: 5 times per week OT Frequency: 5 times per week            Contractures Contractures Info: Not present    Additional Factors Info  Code Status, Allergies Code Status Info: full code Allergies Info: NKDA   Insulin Sliding Scale Info: Insulin Aspart (Novolog) injection 0-20 units Isolation Precautions Info: MRSA blood     Current Medications (10/24/2022):  This is the current hospital active medication list Current Facility-Administered Medications  Medication Dose Route Frequency Provider Last Rate Last Admin   0.9 %  sodium chloride infusion   Intravenous Continuous Darrin Nipper, MD   Stopped at 10/23/22 2222   acetaminophen (TYLENOL) tablet 650 mg  650 mg Oral Q6H PRN Annita Brod, MD   650 mg at 10/23/22 2207   Or   acetaminophen (TYLENOL) suppository 650 mg  650 mg Rectal Q6H PRN Annita Brod, MD       apixaban Arne [place]) tablet 5 mg  5 mg Oral BID Dallie Piles, RPH   5 mg at  10/24/22 0910   aspirin EC tablet 81 mg  81 mg Oral Daily Poggi, Marshall Cork, MD   81 mg at 10/24/22 0910   atorvastatin (LIPITOR) tablet 40 mg  40 mg Oral Daily Poggi, Marshall Cork, MD   40 mg at 10/24/22 0910   carvedilol (COREG) tablet 6.25 mg  6.25 mg Oral BID WC Poggi, Marshall Cork, MD   6.25 mg at 10/24/22 2353   Chlorhexidine Gluconate Cloth 2 % PADS 6 each  6 each Topical Daily Annita Brod, MD   6 each at 10/23/22 1029   DAPTOmycin (CUBICIN)  700 mg in sodium chloride 0.9 % IVPB  700 mg Intravenous Q24H Dallie Piles, [facility]   Stopped at 10/23/22 2252   diphenhydrAMINE (BENADRYL) capsule 25 mg  25 mg Oral QHS PRN Annita Brod, MD       docusate sodium (COLACE) capsule 100 mg  100 mg Oral BID Annita Brod, MD   100 mg at 10/24/22 0910   furosemide (LASIX) tablet 40 mg  40 mg Oral Daily Poggi, Marshall Cork, MD   40 mg at 10/24/22 0910   HYDROmorphone (DILAUDID) injection 1 mg  1 mg Intravenous Q3H PRN Annita Brod, MD       hyoscyamine (LEVSIN SL) SL tablet 0.125-0.25 mg  0.125-0.25 mg Sublingual Q4H PRN Poggi, Marshall Cork, MD       insulin aspart (novoLOG) injection 0-5 Units  0-5 Units Subcutaneous QHS Annita Brod, MD   3 Units at 10/23/22 2238   insulin aspart (novoLOG) injection 0-9 Units  0-9 Units Subcutaneous TID WC Annita Brod, MD   5 Units at 10/24/22 1232   insulin glargine-yfgn (SEMGLEE) injection 28 Units  28 Units Subcutaneous Daily Corky Mull, MD   28 Units at 10/24/22 1003   lactulose (CHRONULAC) 10 GM/15ML solution 20 g  20 g Oral Daily Annita Brod, MD   20 g at 10/24/22 1000   multivitamin with minerals tablet 1 tablet  1 tablet Oral Daily Poggi, Marshall Cork, MD   1 tablet at 10/24/22 0910   ondansetron (ZOFRAN) tablet 4 mg  4 mg Oral Q6H PRN Annita Brod, MD       Or   ondansetron [facility]) injection 4 mg  4 mg Intravenous Q6H PRN Annita Brod, MD       Oral care mouth rinse  15 mL Mouth Rinse PRN Emeterio Reeve, DO       oxyCODONE (Oxy IR/ROXICODONE) immediate release tablet 5 mg  5 mg Oral Q6H PRN Annita Brod, MD   5 mg at 10/24/22 0910   pantoprazole (PROTONIX) EC tablet 40 mg  40 mg Oral Daily Poggi, Marshall Cork, MD   40 mg at 10/24/22 0910   sodium chloride flush (NS) 0.9 % injection 10-40 mL  10-40 mL Intracatheter Q12H Annita Brod, MD   10 mL at 10/23/22 2227   sodium chloride flush (NS) 0.9 % injection 10-40 mL  10-40 mL Intracatheter PRN Annita Brod, MD        Uribel 118 MG CAPS 118 mg  1 capsule Oral Q6H PRN Poggi, Marshall Cork, MD         Discharge Medications: Please see discharge summary for a list of discharge medications.  Relevant Imaging Results:  Relevant Lab Results:   Additional Information SSN [id number]  Conception Oms, RN

## 2022-10-24 NOTE — Progress Notes (Signed)
Physical Therapy Treatment Patient Details Name: [name] MRN: [id number] DOB: Apr 16, 1946 Today's Date: 10/24/2022   History of Present Illness Patient here with septic left knee, s/p I & D. WBAT. Here recently for same.    PT Comments    Patient is making progress towards PT goals. He had increased ambulation distance this session with cues for gait kinematics. Patient does require increased assistance for sit to stand transfers from lower surfaces and cues for anterior weight shifting. Recommend to continue PT to maximize independence and decrease caregiver burden.    Recommendations for follow up therapy are one component of a multi-disciplinary discharge planning process, led by the attending physician.  Recommendations may be updated based on patient status, additional functional criteria and insurance authorization.  Follow Up Recommendations  Skilled nursing-short term rehab (<3 hours/day) Can patient physically be transported by private vehicle: Yes   Assistance Recommended at Discharge Frequent or constant Supervision/Assistance  Patient can return home with the following A little help with walking and/or transfers;Assistance with cooking/housework;Assist for transportation;Help with stairs or ramp for entrance;A little help with bathing/dressing/bathroom;Direct supervision/assist for medications management   Equipment Recommendations  None recommended by PT    Recommendations for Other Services       Precautions / Restrictions Precautions Precautions: Fall Restrictions Weight Bearing Restrictions: Yes LLE Weight Bearing: Weight bearing as tolerated     Mobility  Bed Mobility Overal bed mobility: Needs Assistance Bed Mobility: Supine to Sit     Supine to sit: Modified independent (Device/Increase time)     General bed mobility comments: increased time, no physical assistance required    Transfers Overall transfer level: Needs assistance Equipment  used: Rolling walker (2 wheels) Transfers: Sit to/from Stand Sit to Stand: Min assist, Mod assist           General transfer comment: Min A for standing from bed with bed height elevated. Mod A for standing from lower toilet using grab bar for safety x 2 bouts. patient required cues for anterior weight shifting and hand placement    Ambulation/Gait Ambulation/Gait assistance: Min guard Gait Distance (Feet): 60 Feet Assistive device: Rolling walker (2 wheels) Gait Pattern/deviations: Step-through pattern, Antalgic, Decreased stance time - left Gait velocity: decreased     General Gait Details: cues for heel-toe gait pattern. patient relying heavily on rolling walker for support with ambulation. further gait distance limited by pain in the left knee with activity   Stairs             Wheelchair Mobility    Modified Rankin (Stroke Patients Only)       Balance Overall balance assessment: Needs assistance Sitting-balance support: Feet supported Sitting balance-Leahy Scale: Good     Standing balance support: Bilateral upper extremity supported Standing balance-Leahy Scale: Fair Standing balance comment: reliant on RW for dynamic standing activity                            Cognition Arousal/Alertness: Awake/alert Behavior During Therapy: WFL for tasks assessed/performed Overall Cognitive Status: Within Functional Limits for tasks assessed                                 General Comments: patient is able to follow single step commands consistently. increased time required to complete tasks        Exercises      General Comments  Pertinent Vitals/Pain Pain Assessment Pain Assessment: 0-10 Pain Score:  (2.5) Pain Location: L knee Pain Descriptors / Indicators: Discomfort, Sore Pain Intervention(s): Limited activity within patient's tolerance, Monitored during session, RN gave pain meds during session, Repositioned    Home  Living                          Prior Function            PT Goals (current goals can now be found in the care plan section) Acute Rehab PT Goals Patient Stated Goal: rehab then home." PT Goal Formulation: With patient Time For Goal Achievement: 11/06/22 Potential to Achieve Goals: Good Progress towards PT goals: Progressing toward goals    Frequency    7X/week      PT Plan Current plan remains appropriate    Co-evaluation              AM-PAC PT "6 Clicks" Mobility   Outcome Measure  Help needed turning from your back to your side while in a flat bed without using bedrails?: None Help needed moving from lying on your back to sitting on the side of a flat bed without using bedrails?: None Help needed moving to and from a bed to a chair (including a wheelchair)?: A Little Help needed standing up from a chair using your arms (e.g., wheelchair or bedside chair)?: A Little Help needed to walk in hospital room?: A Little Help needed climbing 3-5 steps with a railing? : A Lot 6 Click Score: 19    End of Session Equipment Utilized During Treatment: Gait belt Activity Tolerance: Patient tolerated treatment well;Patient limited by pain Patient left: in chair;with call bell/phone within reach;with chair alarm set;with SCD's reapplied Nurse Communication: Mobility status PT Visit Diagnosis: Muscle weakness (generalized) (M62.81);Difficulty in walking, not elsewhere classified (R26.2);Pain Pain - Right/Left: Left Pain - part of body: Knee     Time: 1829-9371 PT Time Calculation (min) (ACUTE ONLY): 33 min  Charges:  $Gait Training: 8-22 mins $Therapeutic Activity: 8-22 mins                     [name], PT, MPT    Percell Locus 10/24/2022, 10:08 AM

## 2022-10-24 NOTE — TOC Progression Note (Signed)
Transition of Care [facility]) - Progression Note    Patient Details  Name: [name] MRN: [id number] Date of Birth: 07-Mar-1946  [facility] CM/SW [place], RN Phone Number: 10/24/2022, 2:09 PM  Clinical Narrative:     Patient plans to return to [facility] [place], [facility]       Expected Discharge Plan and Services                                                 Social Determinants of Health (SDOH) Interventions    Readmission Risk Interventions     No data to display

## 2022-10-24 NOTE — Consult Note (Signed)
WOC Nurse Consult Note: Reason for Consult: bilateral heel pressure injuries Wound type: Deep Tissue Pressure Injuries  Pressure Injury POA: Yes Measurement: see nursing flowsheets Wound bed: stable; resolving DTPIs Drainage (amount, consistency, odor) none Periwound: intact  Dressing procedure/placement/frequency: Stable; reabsorbing blood filled blisters,  Paint with betadine daily; all to allow air dry.  Offload at all times with Prevalon boots.   Orders updated.   Re consult if needed, will not follow at this time. Thanks  Deovion Batrez [employer], RN,CWOCN, CNS, [place] [phone])
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
## 2022-10-24 NOTE — Inpatient Diabetes Management (Signed)
Inpatient Diabetes Program Recommendations  AACE/ADA: New Consensus Statement on Inpatient Glycemic Control (2015)  Target Ranges:  Prepandial:   less than 140 mg/dL      Peak postprandial:   less than 180 mg/dL (1-2 hours)      Critically ill patients:  140 - 180 mg/dL   Lab Results  Component Value Date   GLUCAP 194 (H) 10/24/2022   HGBA1C 7.3 (H) 10/03/2022    Review of Glycemic Control  Latest Reference Range & Units 10/23/22 08:15 10/23/22 12:00 10/23/22 16:49 10/23/22 21:28 10/24/22 08:11  Glucose-Capillary 70 - 99 mg/dL 193 (H) 227 (H) 288 (H) 268 (H) 194 (H)  (H): Data is abnormally high  Diabetes history: DM1(does not make insulin.  Needs correction, basal and meal coverage)  Outpatient Diabetes medications:  Novolog 6 units TID Novolog 0-20 units TID Semglee 28 units QD  Current orders for Inpatient glycemic control:  Semglee 28 units QD Novolog 0-9 units TID and 0-5 units QHS  Inpatient Diabetes Program Recommendations:    Please consider:  Carb modified diet Semglee 30 units QD Novolog 3 units TID with meals   Will continue to follow while inpatient.  Thank you, Reche Dixon, MSN, [place] Diabetes Coordinator Inpatient Diabetes Program [phone] (team pager from 8a-5p)

## 2022-10-24 NOTE — Progress Notes (Signed)
PROGRESS NOTE    [name]   YQM:[id number] DOB: [birth year]  DOA: 10/21/2022 Date of Service: 10/24/22 PCP: [name], MD     Brief Narrative / Hospital Course:  Patient is a 76 year old male with past medical history of type 1 diabetes mellitus on insulin pump, history of left leg DVT on Eliquis, CAD status post angioplasty, bladder cancer and previous history of MRSA infection who presented to the emergency room on 11/22 and was admitted to the hospitalist service for recurrent septic arthritis of left knee.    Patient underwent irrigation debridement and partial meniscectomy on 11/23 with repeat irrigation debridement on 11/27 and was placed on IV daptomycin for 6 weeks.  He was discharged on 12/4 to skilled nursing.    Patient was seen in the orthopedic office on the morning of 12/11 for suture removal and noted to have purulent drainage coming from the wound.  Orthopedics  took patient to the OR on 12/11 afternoon for repeat arthroscopic debridement and hospitalists were contacted for admission. Hemovac drain removed 10/23/22, possible need for additional washout on 10/24/22. Cultures showing few staph aureus.  Gram stain with rare gram + cocci in singles - pending s/s.  Patient is currently on Daptomycin.     Consultants:  Orthopedic surgery Infectious disease   Procedures: 10/21/22 Arthroscopic irrigation debridement of left knee (Dr [name] - ortho surgery)      ASSESSMENT & PLAN:   Principal Problem:   Septic arthritis of knee, left ([place]) Active Problems:   Chronic systolic CHF (congestive heart failure) ([place])   Uncontrolled type 1 diabetes mellitus with hyperglycemia, with long-term current use of insulin ([name])   DVT (deep venous thrombosis) ([name])   HYPERTENSION, BENIGN   Chronic constipation   Overweight (BMI 25.0-29.9)   Septic arthritis of knee, left [facility]) Appreciate orthopedic intervention.   Status post repeat arthroscopic debridement  10/21/22.   Continue IV daptomycin pending final cultures - ID following    Chronic systolic CHF (congestive heart failure) ([place]) At patient's last hospitalization, 2D echo and TEE confirmed ejection fraction of 35 to 40%.  BNP minimally elevated, has received 1 dose of IV Lasix on this admission    Uncontrolled type 1 diabetes mellitus with hyperglycemia, with long-term current use of insulin ([name]) Resume home medications and sliding scale   DVT (deep venous thrombosis) ([place]) Normally on Eliquis.     HYPERTENSION, BENIGN Stable. Hypotension secondary to anesthesia.   Will resume home medications when blood pressure starts to rise   Chronic constipation Patient has issues with chronic constipation.   He states he has not had a bowel movement since the day he discharged a week prior to this admission.   Will start some Colace and lactulose.   Overweight (BMI 25.0-29.9) Meets criteria BMI greater than 25    DVT prophylaxis: eliquis Pertinent IV fluids/nutrition: no continuous IV fluids Central lines / invasive devices: none  Code Status: FULL CODE  Disposition: inpatient TOC needs: SNF Barriers to discharge / significant pending items: waiting on final culture susceptibilities but if those come back and can confirm abx w/ ID, patient be ok to discharge from surgical standpoint and medical standpoint. Still waiting on SNF rehab bed offer.              Subjective:  Patient reports no concerns Denies CP/SOB.  Pain controlled.  Denies new weakness.  Tolerating diet.  Reports no concerns w/ urination/defecation.   Family Communication: none at this time  Objective Findings:  Vitals:   10/23/22 0815 10/23/22 1647 10/24/22 0104 10/24/22 0812  BP: (!) 113/54 128/70 (!) 106/51 (!) 126/51  Pulse: 83 89 84 89  Resp: '18 18 18 17  '$ Temp: 98.2 F (36.8 C) 98.7 F (37.1 C) 98.2 F (36.8 C) 98.8 F (37.1 C)  TempSrc:      SpO2: 90% 92% 91% 92%  Weight:       Height:        Intake/Output Summary (Last 24 hours) at 10/24/2022 1507 Last data filed at 10/24/2022 9381 Gross per 24 hour  Intake 84.48 ml  Output 700 ml  Net -615.52 ml   Filed Weights   10/21/22 1454  Weight: 86.2 kg    Examination:  Constitutional:  VS as above General Appearance: alert, NAD Respiratory: Normal respiratory effort No wheeze No rhonchi No rales Cardiovascular: S1/S2 normal No rub/gallop auscultated Gastrointestinal: No tenderness Musculoskeletal:  Symmetrical movement in all extremities Neurological: No cranial nerve deficit on limited exam Alert Psychiatric: Normal judgment/insight Normal mood and affect       Scheduled Medications:   apixaban  5 mg Oral BID   aspirin EC  81 mg Oral Daily   atorvastatin  40 mg Oral Daily   carvedilol  6.25 mg Oral BID WC   Chlorhexidine Gluconate Cloth  6 each Topical Daily   docusate sodium  100 mg Oral BID   furosemide  40 mg Oral Daily   insulin aspart  0-5 Units Subcutaneous QHS   insulin aspart  0-9 Units Subcutaneous TID WC   insulin glargine-yfgn  28 Units Subcutaneous Daily   lactulose  20 g Oral Daily   multivitamin with minerals  1 tablet Oral Daily   pantoprazole  40 mg Oral Daily   sodium chloride flush  10-40 mL Intracatheter Q12H    Continuous Infusions:  sodium chloride Stopped (10/23/22 2222)   DAPTOmycin (CUBICIN) 700 mg in sodium chloride 0.9 % IVPB Stopped (10/23/22 2252)    PRN Medications:  acetaminophen **OR** acetaminophen, diphenhydrAMINE, HYDROmorphone (DILAUDID) injection, hyoscyamine, ondansetron **OR** ondansetron (ZOFRAN) IV, mouth rinse, oxyCODONE, sodium chloride flush, Uribel  Antimicrobials:  Anti-infectives (From admission, onward)    Start     Dose/Rate Route Frequency Ordered Stop   10/21/22 2200  DAPTOmycin (CUBICIN) 700 mg in sodium chloride 0.9 % IVPB        700 mg 128 mL/hr over 30 Minutes Intravenous Every 24 hours 10/21/22 1854     10/21/22  1930  daptomycin (CUBICIN) IVPB  Status:  Discontinued        700 mg Intravenous Every 24 hours 10/21/22 1835 10/21/22 1854   10/21/22 1835  Uribel 118 MG CAPS 118 mg        1 capsule Oral Every 6 hours PRN 10/21/22 1835     10/21/22 1447  ceFAZolin (ANCEF) IVPB 2g/100 mL premix        2 g 200 mL/hr over 30 Minutes Intravenous 60 min pre-op 10/21/22 1445 10/21/22 1557   10/21/22 1447  ceFAZolin (ANCEF) 2-4 GM/100ML-% IVPB       Note to Pharmacy: Norton Blizzard A: cabinet override      10/21/22 1447 10/21/22 1543           Data Reviewed: I have personally reviewed following labs and imaging studies  CBC: Recent Labs  Lab 10/21/22 2141 10/22/22 0515 10/23/22 0530 10/24/22 0620  WBC 10.6* 10.5 11.9* 11.9*  HGB 9.5* 8.3* 8.5* 8.0*  HCT 29.7* 25.9* 25.9* 24.3*  MCV 91.7 90.9 89.3 88.4  PLT 348 337 356 174   Basic Metabolic Panel: Recent Labs  Lab 10/21/22 2141 10/22/22 0515 10/23/22 0530 10/24/22 0620  NA 139 140 140 139  K 3.6 3.7 3.5 3.3*  CL 101 103 100 101  CO2 31 31 33* 33*  GLUCOSE 154* 156* 192* 211*  BUN '22 23 23 22  '$ CREATININE 1.08 1.11 1.20 1.08  CALCIUM 7.8* 7.7* 7.9* 7.7*   GFR: Estimated Creatinine Clearance: 64.3 mL/min (by C-G formula based on SCr of 1.08 mg/dL). Liver Function Tests: Recent Labs  Lab 10/21/22 2141 10/22/22 0515  AST 20 18  ALT 18 15  ALKPHOS 113 109  BILITOT 0.5 0.7  PROT 5.8* 5.4*  ALBUMIN 2.0* 1.8*   No results for input(s): "LIPASE", "AMYLASE" in the last 168 hours. No results for input(s): "AMMONIA" in the last 168 hours. Coagulation Profile: No results for input(s): "INR", "PROTIME" in the last 168 hours. Cardiac Enzymes: Recent Labs  Lab 10/22/22 0515  CKTOTAL 23*   BNP (last 3 results) No results for input(s): "PROBNP" in the last 8760 hours. HbA1C: No results for input(s): "HGBA1C" in the last 72 hours. CBG: Recent Labs  Lab 10/23/22 1200 10/23/22 1649 10/23/22 2128 10/24/22 0811 10/24/22 1212   GLUCAP 227* 288* 268* 194* 253*   Lipid Profile: No results for input(s): "CHOL", "HDL", "LDLCALC", "TRIG", "CHOLHDL", "LDLDIRECT" in the last 72 hours. Thyroid Function Tests: No results for input(s): "TSH", "T4TOTAL", "FREET4", "T3FREE", "THYROIDAB" in the last 72 hours. Anemia Panel: No results for input(s): "VITAMINB12", "FOLATE", "FERRITIN", "TIBC", "IRON", "RETICCTPCT" in the last 72 hours. Most Recent Urinalysis On File:     Component Value Date/Time   COLORURINE YELLOW (A) 10/03/2022 0906   APPEARANCEUR HAZY (A) 10/03/2022 0906   APPEARANCEUR Clear 06/08/2015 1343   LABSPEC 1.021 10/03/2022 0906   PHURINE 5.0 10/03/2022 0906   GLUCOSEU >=500 (A) 10/03/2022 0906   HGBUR MODERATE (A) 10/03/2022 0906   BILIRUBINUR NEGATIVE 10/03/2022 0906   BILIRUBINUR Negative 06/08/2015 1343   KETONESUR 80 (A) 10/03/2022 0906   PROTEINUR 30 (A) 10/03/2022 0906   NITRITE NEGATIVE 10/03/2022 0906   LEUKOCYTESUR NEGATIVE 10/03/2022 0906   Sepsis Labs: '@LABRCNTIP'$ (procalcitonin:4,lacticidven:4)  Recent Results (from the past 240 hour(s))  Aerobic/Anaerobic Culture w Gram Stain (surgical/deep wound)     Status: None (Preliminary result)   Collection Time: 10/21/22  4:01 PM   Specimen: PATH Cytology Misc. fluid; Body Fluid  Result Value Ref Range Status   Specimen Description   Final    WOUND Performed at [facility], [address] [place], [place] [address]    Special Requests   Final    NONE Performed at [facility], [place], [place] [address]    Gram Stain   Final    FEW WBC PRESENT,BOTH PMN AND MONONUCLEAR RARE GRAM POSITIVE COCCI IN SINGLES Performed at [facility], [place] [address] [place], [place] [address]    Culture   Final    FEW STAPHYLOCOCCUS AUREUS CONFIRMATION OF SUSCEPTIBILITIES IN PROGRESS NO ANAEROBES ISOLATED; CULTURE IN PROGRESS FOR 5 DAYS    Report Status PENDING  Incomplete  MRSA Next Gen by PCR, Nasal     Status:  Abnormal   Collection Time: 10/22/22 10:50 AM   Specimen: Nasal Mucosa; Nasal Swab  Result Value Ref Range Status   MRSA by PCR Next Gen DETECTED (A) NOT DETECTED Final    Comment: CRITICAL RESULT CALLED TO, READ BACK  BY AND VERIFIED WITH: RN [place] 10/22/2022 GAA (NOTE) The GeneXpert MRSA Assay (FDA approved for NASAL specimens only), is one component of a comprehensive MRSA colonization surveillance program. It is not intended to diagnose MRSA infection nor to guide or monitor treatment for MRSA infections. Test performance is not FDA approved in patients less than 25 years old. Performed at [facility], [address] [place], [place] [address]          Radiology Studies: No results found.          LOS: 3 days      Emeterio Reeve, DO [facility] 10/24/2022, 3:07 PM    Dictation software may have been used to generate the above note. Typos may occur and escape review in typed/dictated notes. Please contact Dr Sheppard Coil directly for clarity if needed.  Staff may message me via secure chat in [place]  but this may not receive an immediate response,  please page me for urgent matters!  If 7PM-7AM, please contact night coverage [email or web address]      .

## 2022-10-25 ENCOUNTER — Inpatient Hospital Stay: Payer: Medicare HMO

## 2022-10-25 DIAGNOSIS — D649 Anemia, unspecified: Secondary | ICD-10-CM | POA: Diagnosis not present

## 2022-10-25 DIAGNOSIS — E1065 Type 1 diabetes mellitus with hyperglycemia: Secondary | ICD-10-CM | POA: Diagnosis not present

## 2022-10-25 DIAGNOSIS — R7881 Bacteremia: Secondary | ICD-10-CM | POA: Diagnosis not present

## 2022-10-25 DIAGNOSIS — B9562 Methicillin resistant Staphylococcus aureus infection as the cause of diseases classified elsewhere: Secondary | ICD-10-CM

## 2022-10-25 DIAGNOSIS — M00062 Staphylococcal arthritis, left knee: Secondary | ICD-10-CM | POA: Diagnosis not present

## 2022-10-25 DIAGNOSIS — M009 Pyogenic arthritis, unspecified: Secondary | ICD-10-CM | POA: Diagnosis not present

## 2022-10-25 DIAGNOSIS — I82452 Acute embolism and thrombosis of left peroneal vein: Secondary | ICD-10-CM | POA: Diagnosis not present

## 2022-10-25 DIAGNOSIS — I5022 Chronic systolic (congestive) heart failure: Secondary | ICD-10-CM | POA: Diagnosis not present

## 2022-10-25 LAB — BASIC METABOLIC PANEL
Anion gap: 6 (ref 5–15)
BUN: 18 mg/dL (ref 8–23)
CO2: 33 mmol/L — ABNORMAL HIGH (ref 22–32)
Calcium: 7.9 mg/dL — ABNORMAL LOW (ref 8.9–10.3)
Chloride: 101 mmol/L (ref 98–111)
Creatinine, Ser: 0.95 mg/dL (ref 0.61–1.24)
GFR, Estimated: >60 mL/min (ref >60)
Glucose, Bld: 124 mg/dL — ABNORMAL HIGH (ref 70–99)
Potassium: 3.3 mmol/L — ABNORMAL LOW (ref 3.5–5.1)
Sodium: 140 mmol/L (ref 135–145)

## 2022-10-25 LAB — CBC
HCT: 25.4 % — ABNORMAL LOW (ref 39.0–52.0)
Hemoglobin: 8.3 g/dL — ABNORMAL LOW (ref 13.0–17.0)
MCH: 28.8 pg (ref 26.0–34.0)
MCHC: 32.7 g/dL (ref 30.0–36.0)
MCV: 88.2 fL (ref 80.0–100.0)
Platelets: 394 10*3/uL (ref 150–400)
RBC: 2.88 MIL/uL — ABNORMAL LOW (ref 4.22–5.81)
RDW: 14.9 % (ref 11.5–15.5)
WBC: 11 10*3/uL — ABNORMAL HIGH (ref 4.0–10.5)
nRBC: 0 % (ref 0.0–0.2)

## 2022-10-25 LAB — GLUCOSE, CAPILLARY
Glucose-Capillary: 102 mg/dL — ABNORMAL HIGH (ref 70–99)
Glucose-Capillary: 149 mg/dL — ABNORMAL HIGH (ref 70–99)
Glucose-Capillary: 135 mg/dL — ABNORMAL HIGH (ref 70–99)
Glucose-Capillary: 138 mg/dL — ABNORMAL HIGH (ref 70–99)
Glucose-Capillary: 89 mg/dL (ref 70–99)

## 2022-10-25 MED ORDER — SENNOSIDES-DOCUSATE SODIUM 8.6-50 MG PO TABS
2.0000 | ORAL_TABLET | Freq: Once | ORAL | Status: DC
  Filled 2022-10-25: qty 2

## 2022-10-25 MED ORDER — POLYETHYLENE GLYCOL 3350 17 G PO PACK
17.0000 g | PACK | Freq: Once | ORAL | Status: DC
  Filled 2022-10-25: qty 1

## 2022-10-25 MED ORDER — POTASSIUM CHLORIDE CRYS ER 20 MEQ PO TBCR
40.0000 meq | EXTENDED_RELEASE_TABLET | Freq: Once | ORAL | Status: AC
  Administered 2022-10-25: 40 meq via ORAL
  Filled 2022-10-25: qty 2

## 2022-10-25 MED ORDER — SODIUM CHLORIDE 0.9 % IV SOLN
600.0000 mg | Freq: Three times a day (TID) | INTRAVENOUS | Status: DC
  Administered 2022-10-25 – 2022-11-07 (×40): 600 mg via INTRAVENOUS
  Filled 2022-10-25 (×41): qty 20

## 2022-10-25 NOTE — Progress Notes (Signed)
Physical Therapy Treatment Patient Details Name: [name] MRN: [id number] DOB: 1945/12/29 Today's Date: 10/25/2022   History of Present Illness Patient here with septic left knee, s/p I & D. WBAT. Here recently for same.    PT Comments    Pt was in BR upon arrival. Much more lethargic than earlier in the day. Had not received pain meds in over 6 hours. Pt endorses fatigue. Is disoriented to current situation but wasn't earlier. He was safely able to ambulate form BR to EOB but needs vcs/ Tcs for sequencing and performing desired task. Lethargy greatly limited session progression. ID MD arrived during session. Overall limited session due to pt's arousal level. Highly recommend SNF at DC to progress pt with safe functional mobility, transfers, and gait.    Recommendations for follow up therapy are one component of a multi-disciplinary discharge planning process, led by the attending physician.  Recommendations may be updated based on patient status, additional functional criteria and insurance authorization.  Follow Up Recommendations  Skilled nursing-short term rehab (<3 hours/day)     Assistance Recommended at Discharge Frequent or constant Supervision/Assistance  Patient can return home with the following A little help with walking and/or transfers;Assistance with cooking/housework;Assist for transportation;Help with stairs or ramp for entrance;A little help with bathing/dressing/bathroom;Direct supervision/assist for medications management   Equipment Recommendations  None recommended by PT       Precautions / Restrictions Precautions Precautions: Fall Restrictions Weight Bearing Restrictions: Yes LLE Weight Bearing: Weight bearing as tolerated     Mobility  Bed Mobility Overal bed mobility: Needs Assistance Bed Mobility: Sit to Supine  Supine to sit: Min assist, Mod assist (due to lethargy)  General bed mobility comments: increased time, no physical assistance  required    Transfers Overall transfer level: Needs assistance Equipment used: Rolling walker (2 wheels) Transfers: Sit to/from Stand Sit to Stand: Min assist, Mod assist      Ambulation/Gait Ambulation/Gait assistance: Min guard Gait Distance (Feet): 35 Feet Assistive device: Rolling walker (2 wheels) Gait Pattern/deviations: Step-through pattern, Antalgic, Decreased stance time - left Gait velocity: decreased     General Gait Details: pt ambulated with slow gait cadence. Needed encouragement and vcs for staying focused on task at hand.   Balance Overall balance assessment: Needs assistance Sitting-balance support: Feet supported Sitting balance-Leahy Scale: Good     Standing balance support: Bilateral upper extremity supported Standing balance-Leahy Scale: Fair Standing balance comment: reliant on RW for dynamic standing activity       Cognition Arousal/Alertness: Awake/alert Behavior During Therapy: WFL for tasks assessed/performed Overall Cognitive Status: Within Functional Limits for tasks assessed      General Comments: Pt was sitting in BR with RN upon arriving. visibly more lethargic and less alert than when attempted ealier. Had not recieved pain meds since ~930 am.               Pertinent Vitals/Pain Pain Assessment Pain Assessment: 0-10 Pain Score: 6  Pain Location: L knee Pain Descriptors / Indicators: Discomfort, Sore Pain Intervention(s): Limited activity within patient's tolerance, Monitored during session, Premedicated before session, Repositioned     PT Goals (current goals can now be found in the care plan section) Acute Rehab PT Goals Patient Stated Goal: none stated Progress towards PT goals: Progressing toward goals    Frequency    7X/week      PT Plan Current plan remains appropriate    Co-evaluation     PT goals addressed during session: Mobility/safety with mobility;Balance;Proper  use of DME;Strengthening/ROM         AM-PAC PT "6 Clicks" Mobility   Outcome Measure  Help needed turning from your back to your side while in a flat bed without using bedrails?: A Little Help needed moving from lying on your back to sitting on the side of a flat bed without using bedrails?: A Little Help needed moving to and from a bed to a chair (including a wheelchair)?: A Little Help needed standing up from a chair using your arms (e.g., wheelchair or bedside chair)?: A Little Help needed to walk in hospital room?: A Little Help needed climbing 3-5 steps with a railing? : A Lot 6 Click Score: 17    End of Session   Activity Tolerance: Patient tolerated treatment well Patient left: in bed;with call bell/phone within reach;with bed alarm set;with nursing/sitter in room;with SCD's reapplied Nurse Communication: Mobility status PT Visit Diagnosis: Muscle weakness (generalized) (M62.81);Difficulty in walking, not elsewhere classified (R26.2);Pain Pain - Right/Left: Left Pain - part of body: Knee     Time: 1610-9604 PT Time Calculation (min) (ACUTE ONLY): 18 min  Charges:  $Gait Training: 8-22 mins                     Julaine Fusi PTA 10/25/22, 3:09 PM
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
## 2022-10-25 NOTE — Inpatient Diabetes Management (Signed)
Inpatient Diabetes Program Recommendations  AACE/ADA: New Consensus Statement on Inpatient Glycemic Control (2015)  Target Ranges:  Prepandial:   less than 140 mg/dL      Peak postprandial:   less than 180 mg/dL (1-2 hours)      Critically ill patients:  140 - 180 mg/dL   Lab Results  Component Value Date   GLUCAP 102 (H) 10/25/2022   HGBA1C 7.3 (H) 10/03/2022    Review of Glycemic Control  Latest Reference Range & Units 10/24/22 08:11 10/24/22 12:12 10/24/22 16:33 10/24/22 21:06 10/25/22 08:30  Glucose-Capillary 70 - 99 mg/dL 194 (H) 253 (H) 241 (H) 226 (H) 102 (H)  (H): Data is abnormally high  Diabetes history: DM1(does not make insulin.  Needs correction, basal and meal coverage)   Outpatient Diabetes medications:  Novolog 6 units TID Novolog 0-20 units TID Semglee 28 units QD   Current orders for Inpatient glycemic control:  Semglee 28 units QD Novolog 0-9 units TID and 0-5 units QHS   Inpatient Diabetes Program Recommendations:     Please consider:   Novolog 3 units TID with meals if he consumes at least 50%.  Will continue to follow while inpatient.  Thank you, Reche Dixon, MSN, [place] Diabetes Coordinator Inpatient Diabetes Program [phone] (team pager from 8a-5p)

## 2022-10-25 NOTE — Care Management Important Message (Signed)
Important Message  Patient Details  Name: [name] MRN: [id number] Date of Birth: 10-19-1946   Medicare Important Message Given:  Other (see comment)  Attempted to reach patient via room phone [phone]) to review Medicare IM due to isolation status.  Unable to reach upon attempt.    Dannette Barbara 10/25/2022, 2:49 PM

## 2022-10-25 NOTE — TOC Progression Note (Signed)
Transition of Care [facility]) - Progression Note    Patient Details  Name: [name] MRN: [id number] Date of Birth: 02-06-1946  Transition of Care [facility]) CM/SW [place], RN Phone Number: 10/25/2022, 2:09 PM  Clinical Narrative:        approved 12/15 - 12/19, review due 12/19. Reference ID: [id number].    Expected Discharge Plan and Services                                                 Social Determinants of Health (SDOH) Interventions    Readmission Risk Interventions     No data to display

## 2022-10-25 NOTE — Discharge Instructions (Addendum)
Diet: As you were doing prior to hospitalization   Shower:  May shower but keep the wounds dry, use an occlusive plastic wrap, NO SOAKING IN TUB.  If the bandage gets wet, change with a clean dry gauze.  Dressing:  You may change your dressing as needed. Change the dressing with sterile gauze dressing.    Activity:  Increase activity slowly as tolerated, but follow the weight bearing instructions below.  No lifting or driving for 6 weeks.  Weight Bearing:   Weight bearing as tolerated to left lower extremity  To prevent constipation: you may use a stool softener such as -  Colace (over the counter) 100 mg by mouth twice a day  Drink plenty of fluids (prune juice may be helpful) and high fiber foods Miralax (over the counter) for constipation as needed.    Itching:  If you experience itching with your medications, try taking only a single pain pill, or even half a pain pill at a time.  You may take up to 10 pain pills per day, and you can also use benadryl over the counter for itching or also to help with sleep.   Precautions:  If you experience chest pain or shortness of breath - call 911 immediately for transfer to the hospital emergency department!!  If you develop a fever greater that 101 F, purulent drainage from wound, increased redness or drainage from wound, or calf pain-Call [place]                                              Follow- Up Appointment:  Please call for an appointment to be seen in 2 weeks at [facility]     Follow with [name], MD in 5-7 days  Please get a complete blood count and chemistry panel checked by your Primary MD at your next visit, and again as instructed by your Primary MD. Please get your medications reviewed and adjusted by your Primary MD.  Please request your Primary MD to go over all Hospital Tests and Procedure/Radiological results at the follow up, please get all Hospital records sent to your Prim MD by signing  hospital release before you go home.  In some cases, there will be blood work, cultures and biopsy results pending at the time of your discharge. Please request that your primary care M.D. goes through all the records of your hospital data and follows up on these results.  If you had Pneumonia of Lung problems at the Hospital: Please get a 2 view Chest X ray done in 6-8 weeks after hospital discharge or sooner if instructed by your Primary MD.  If you have Congestive Heart Failure: Please call your Cardiologist or Primary MD anytime you have any of the following symptoms:  1) 3 pound weight gain in 24 hours or 5 pounds in 1 week  2) shortness of breath, with or without a dry hacking cough  3) swelling in the hands, feet or stomach  4) if you have to sleep on extra pillows at night in order to breathe  Follow cardiac low salt diet and 1.5 lit/day fluid restriction.  If you have diabetes Accuchecks 4 times/day, Once in AM empty stomach and then before each meal. Log in all results and show them to your primary doctor at your next visit. If any glucose reading is under 80 or  above 300 call your primary MD immediately.  If you have Seizure/Convulsions/Epilepsy: Please do not drive, operate heavy machinery, participate in activities at heights or participate in high speed sports until you have seen by Primary MD or a Neurologist and advised to do so again. Per [facility] statutes, patients with seizures are not allowed to drive until they have been seizure-free for six months.  Use caution when using heavy equipment or power tools. Avoid working on ladders or at heights. Take showers instead of baths. Ensure the water temperature is not too high on the home water heater. Do not go swimming alone. Do not lock yourself in a room alone (i.e. bathroom). When caring for infants or small children, sit down when holding, feeding, or changing them to minimize risk of injury to the child in the  event you have a seizure. Maintain good sleep hygiene. Avoid alcohol.   If you had Gastrointestinal Bleeding: Please ask your Primary MD to check a complete blood count within one week of discharge or at your next visit. Your endoscopic/colonoscopic biopsies that are pending at the time of discharge, will also need to followed by your Primary MD.  Get Medicines reviewed and adjusted. Please take all your medications with you for your next visit with your Primary MD  Please request your Primary MD to go over all hospital tests and procedure/radiological results at the follow up, please ask your Primary MD to get all Hospital records sent to his/her office.  If you experience worsening of your admission symptoms, develop shortness of breath, life threatening emergency, suicidal or homicidal thoughts you must seek medical attention immediately by calling 911 or calling your MD immediately  if symptoms less severe.  You must read complete instructions/literature along with all the possible adverse reactions/side effects for all the Medicines you take and that have been prescribed to you. Take any new Medicines after you have completely understood and accpet all the possible adverse reactions/side effects.   Do not drive or operate heavy machinery when taking Pain medications.   Do not take more than prescribed Pain, Sleep and Anxiety Medications  Special Instructions: If you have smoked or chewed Tobacco  in the last 2 yrs please stop smoking, stop any regular Alcohol  and or any Recreational drug use.  Wear Seat belts while driving.  Please note You were cared for by a hospitalist during your hospital stay. If you have any questions about your discharge medications or the care you received while you were in the hospital after you are discharged, you can call the unit and asked to speak with the hospitalist on call if the hospitalist that took care of you is not available. Once you are discharged,  your primary care physician will handle any further medical issues. Please note that NO REFILLS for any discharge medications will be authorized once you are discharged, as it is imperative that you return to your primary care physician (or establish a relationship with a primary care physician if you do not have one) for your aftercare needs so that they can reassess your need for medications and monitor your lab values.  You can reach the hospitalist office at phone [phone] or fax [phone]   If you do not have a primary care physician, you can call [phone] for a physician referral.  Activity: As tolerated with Full fall precautions use walker/cane & assistance as needed    Diet: diabetic  Disposition Home
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
## 2022-10-25 NOTE — Progress Notes (Addendum)
PROGRESS NOTE    [name]   WUJ:[id number] DOB: [birth year]  DOA: 10/21/2022 Date of Service: 10/25/22 PCP: [name], MD     Brief Narrative / Hospital Course:  Patient is a 76 year old male with past medical history of type 1 diabetes mellitus on insulin pump, history of left leg DVT on Eliquis, CAD status post angioplasty, bladder cancer and previous history of MRSA infection who presented to the emergency room on 11/22 and was admitted to the hospitalist service for recurrent septic arthritis of left knee.    Patient underwent irrigation debridement and partial meniscectomy on 11/23 with repeat irrigation debridement on 11/27 and was placed on IV daptomycin for 6 weeks.  He was discharged on 12/4 to skilled nursing.    Patient was seen in the orthopedic office on the morning of 12/11 for suture removal and noted to have purulent drainage coming from the wound.  Orthopedics  took patient to the OR on 12/11 afternoon for repeat arthroscopic debridement and hospitalists were contacted for admission. Hemovac drain removed 10/23/22, possible need for additional washout on 10/24/22. Cultures showing few staph aureus.  Gram stain with rare gram + cocci in singles - pending susceptibilities as of 12/15 AM.  Patient is currently on Daptomycin. Culture came back as (+)MRSA, given lack of substantial improvement, ID would like to change abx and keep him here through the weekend. SNF rehab plan for Monday if doing well.   Evening 12/15 elevated temp and cough, ordered CXR and repeat BCx     Consultants:  Orthopedic surgery Infectious disease   Procedures: 10/21/22 Arthroscopic irrigation debridement of left knee (Dr [name] - ortho surgery)        ASSESSMENT & PLAN:   Principal Problem:   Septic arthritis of knee, left ([place]) Active Problems:   Chronic systolic CHF (congestive heart failure) ([place])   Uncontrolled type 1 diabetes mellitus with hyperglycemia, with  long-term current use of insulin ([name])   DVT (deep venous thrombosis) ([name])   HYPERTENSION, BENIGN   Chronic constipation   Overweight (BMI 25.0-29.9)   Septic arthritis of knee, left ([place]) MRSA Appreciate orthopedic intervention.   Status post repeat arthroscopic debridement 10/21/22.   Continue IV antibiotics per ID --> added ceftaroline to daptomycin today 10/25/22    Chronic systolic CHF (congestive heart failure) ([place]) At patient's last hospitalization, 2D echo and TEE confirmed ejection fraction of 35 to 40%.  BNP minimally elevated, has received 1 dose of IV Lasix on this admission    Uncontrolled type 1 diabetes mellitus with hyperglycemia, with long-term current use of insulin ([name]) Resume home medications and sliding scale   DVT (deep venous thrombosis) ([place]) Normally on Eliquis.     HYPERTENSION, BENIGN Stable. Hypotension secondary to anesthesia.   Will resume home medications when blood pressure starts to rise   Chronic constipation Patient has issues with chronic constipation.   He states he has not had a bowel movement since the day he discharged a week prior to this admission.   Will start some Colace and lactulose.   Overweight (BMI 25.0-29.9) Meets criteria BMI greater than 25    DVT prophylaxis: eliquis Pertinent IV fluids/nutrition: no continuous IV fluids Central lines / invasive devices: none  Code Status: FULL CODE  Disposition: inpatient TOC needs: SNF Barriers to discharge / significant pending items: here thru the weekend per ID recs on new IV abx and if improving / stable on Monday plan for discharge to SNF  Subjective:  Patient reports no concerns Denies CP/SOB.  Pain controlled.  Denies new weakness.  Tolerating diet.  Reports no concerns w/ urination/defecation.   Family Communication: none at this time     Objective Findings:  Vitals:   10/25/22 0010 10/25/22 0834 10/25/22 1538 10/25/22 1800  BP: (!)  108/52 137/82 132/62   Pulse: 89 89 91   Resp: '18 16 20   '$ Temp: 98.7 F (37.1 C) 98.4 F (36.9 C) 100.1 F (37.8 C) 99.3 F (37.4 C)  TempSrc:      SpO2: 90% 93% 91%   Weight:      Height:        Intake/Output Summary (Last 24 hours) at 10/25/2022 1845 Last data filed at 10/25/2022 1742 Gross per 24 hour  Intake 107.86 ml  Output 275 ml  Net -167.14 ml   Filed Weights   10/21/22 1454  Weight: 86.2 kg    Examination:  Constitutional:  VS as above General Appearance: alert, NAD Respiratory: Normal respiratory effort No wheeze No rhonchi No rales Cardiovascular: S1/S2 normal No rub/gallop auscultated Gastrointestinal: No tenderness Musculoskeletal:  Symmetrical movement in all extremities Neurological: No cranial nerve deficit on limited exam Alert Psychiatric: Normal judgment/insight Normal mood and affect       Scheduled Medications:   apixaban  5 mg Oral BID   aspirin EC  81 mg Oral Daily   atorvastatin  40 mg Oral Daily   carvedilol  6.25 mg Oral BID WC   Chlorhexidine Gluconate Cloth  6 each Topical Daily   docusate sodium  100 mg Oral BID   furosemide  40 mg Oral Daily   insulin aspart  0-5 Units Subcutaneous QHS   insulin aspart  0-9 Units Subcutaneous TID WC   insulin glargine-yfgn  28 Units Subcutaneous Daily   lactulose  20 g Oral Daily   multivitamin with minerals  1 tablet Oral Daily   pantoprazole  40 mg Oral Daily   polyethylene glycol  17 g Oral Once   senna-docusate  2 tablet Oral Once   sodium chloride flush  10-40 mL Intracatheter Q12H    Continuous Infusions:  sodium chloride Stopped (10/23/22 2222)   ceFTAROline (TEFLARO) IV 600 mg (10/25/22 1630)   DAPTOmycin (CUBICIN) 700 mg in sodium chloride 0.9 % IVPB 700 mg (10/24/22 2301)    PRN Medications:  acetaminophen **OR** acetaminophen, diphenhydrAMINE, HYDROmorphone (DILAUDID) injection, hyoscyamine, ondansetron **OR** ondansetron (ZOFRAN) IV, mouth rinse, oxyCODONE,  sodium chloride flush, Uribel  Antimicrobials:  Anti-infectives (From admission, onward)    Start     Dose/Rate Route Frequency Ordered Stop   10/25/22 1530  ceftaroline (TEFLARO) 600 mg in sodium chloride 0.9 % 100 mL IVPB        600 mg 100 mL/hr over 60 Minutes Intravenous Every 8 hours 10/25/22 1420     10/21/22 2200  DAPTOmycin (CUBICIN) 700 mg in sodium chloride 0.9 % IVPB        700 mg 128 mL/hr over 30 Minutes Intravenous Every 24 hours 10/21/22 1854     10/21/22 1930  daptomycin (CUBICIN) IVPB  Status:  Discontinued        700 mg Intravenous Every 24 hours 10/21/22 1835 10/21/22 1854   10/21/22 1835  Uribel 118 MG CAPS 118 mg        1 capsule Oral Every 6 hours PRN 10/21/22 1835     10/21/22 1447  ceFAZolin (ANCEF) IVPB 2g/100 mL premix        2 g  200 mL/hr over 30 Minutes Intravenous 60 min pre-op 10/21/22 1445 10/21/22 1557   10/21/22 1447  ceFAZolin (ANCEF) 2-4 GM/100ML-% IVPB       Note to Pharmacy: Norton Blizzard A: cabinet override      10/21/22 1447 10/21/22 1543           Data Reviewed: I have personally reviewed following labs and imaging studies  CBC: Recent Labs  Lab 10/21/22 2141 10/22/22 0515 10/23/22 0530 10/24/22 0620 10/25/22 0600  WBC 10.6* 10.5 11.9* 11.9* 11.0*  HGB 9.5* 8.3* 8.5* 8.0* 8.3*  HCT 29.7* 25.9* 25.9* 24.3* 25.4*  MCV 91.7 90.9 89.3 88.4 88.2  PLT 348 337 356 346 631   Basic Metabolic Panel: Recent Labs  Lab 10/21/22 2141 10/22/22 0515 10/23/22 0530 10/24/22 0620 10/25/22 0600  NA 139 140 140 139 140  K 3.6 3.7 3.5 3.3* 3.3*  CL 101 103 100 101 101  CO2 31 31 33* 33* 33*  GLUCOSE 154* 156* 192* 211* 124*  BUN '22 23 23 22 18  '$ CREATININE 1.08 1.11 1.20 1.08 0.95  CALCIUM 7.8* 7.7* 7.9* 7.7* 7.9*   GFR: Estimated Creatinine Clearance: 73.1 mL/min (by C-G formula based on SCr of 0.95 mg/dL). Liver Function Tests: Recent Labs  Lab 10/21/22 2141 10/22/22 0515  AST 20 18  ALT 18 15  ALKPHOS 113 109  BILITOT  0.5 0.7  PROT 5.8* 5.4*  ALBUMIN 2.0* 1.8*   No results for input(s): "LIPASE", "AMYLASE" in the last 168 hours. No results for input(s): "AMMONIA" in the last 168 hours. Coagulation Profile: No results for input(s): "INR", "PROTIME" in the last 168 hours. Cardiac Enzymes: Recent Labs  Lab 10/22/22 0515  CKTOTAL 23*   BNP (last 3 results) No results for input(s): "PROBNP" in the last 8760 hours. HbA1C: No results for input(s): "HGBA1C" in the last 72 hours. CBG: Recent Labs  Lab 10/24/22 2106 10/25/22 0830 10/25/22 1128 10/25/22 1357 10/25/22 1637  GLUCAP 226* 102* 149* 135* 138*   Lipid Profile: No results for input(s): "CHOL", "HDL", "LDLCALC", "TRIG", "CHOLHDL", "LDLDIRECT" in the last 72 hours. Thyroid Function Tests: No results for input(s): "TSH", "T4TOTAL", "FREET4", "T3FREE", "THYROIDAB" in the last 72 hours. Anemia Panel: No results for input(s): "VITAMINB12", "FOLATE", "FERRITIN", "TIBC", "IRON", "RETICCTPCT" in the last 72 hours. Most Recent Urinalysis On File:     Component Value Date/Time   COLORURINE YELLOW (A) 10/03/2022 0906   APPEARANCEUR HAZY (A) 10/03/2022 0906   APPEARANCEUR Clear 06/08/2015 1343   LABSPEC 1.021 10/03/2022 0906   PHURINE 5.0 10/03/2022 0906   GLUCOSEU >=500 (A) 10/03/2022 0906   HGBUR MODERATE (A) 10/03/2022 0906   BILIRUBINUR NEGATIVE 10/03/2022 0906   BILIRUBINUR Negative 06/08/2015 1343   KETONESUR 80 (A) 10/03/2022 0906   PROTEINUR 30 (A) 10/03/2022 0906   NITRITE NEGATIVE 10/03/2022 0906   LEUKOCYTESUR NEGATIVE 10/03/2022 0906   Sepsis Labs: '@LABRCNTIP'$ (procalcitonin:4,lacticidven:4)  Recent Results (from the past 240 hour(s))  Aerobic/Anaerobic Culture w Gram Stain (surgical/deep wound)     Status: None (Preliminary result)   Collection Time: 10/21/22  4:01 PM   Specimen: PATH Cytology Misc. fluid; Body Fluid  Result Value Ref Range Status   Specimen Description WOUND  Final   Special Requests NONE  Final   Gram  Stain   Final    FEW WBC PRESENT,BOTH PMN AND MONONUCLEAR RARE GRAM POSITIVE COCCI IN SINGLES    Culture   Final    FEW METHICILLIN RESISTANT STAPHYLOCOCCUS AUREUS NO ANAEROBES ISOLATED; CULTURE IN  PROGRESS FOR 5 DAYS    Report Status PENDING  Incomplete   Organism ID, Bacteria METHICILLIN RESISTANT STAPHYLOCOCCUS AUREUS  Final      Susceptibility   Methicillin resistant staphylococcus aureus - MIC*    CIPROFLOXACIN >=8 RESISTANT Resistant     ERYTHROMYCIN >=8 RESISTANT Resistant     GENTAMICIN <=0.5 SENSITIVE Sensitive     OXACILLIN RESISTANT Resistant     TETRACYCLINE <=1 SENSITIVE Sensitive     VANCOMYCIN 2 SENSITIVE Sensitive     TRIMETH/SULFA <=10 SENSITIVE Sensitive     CLINDAMYCIN <=0.25 SENSITIVE Sensitive     RIFAMPIN <=0.5 SENSITIVE Sensitive     Inducible Clindamycin NEGATIVE Sensitive     LINEZOLID Value in next row Sensitive      SENSITIVEMIC 2Performed at [facility] [address], [place], [place] [address]    * FEW METHICILLIN RESISTANT STAPHYLOCOCCUS AUREUS  MRSA Next Gen by PCR, Nasal     Status: Abnormal   Collection Time: 10/22/22 10:50 AM   Specimen: Nasal Mucosa; Nasal Swab  Result Value Ref Range Status   MRSA by PCR Next Gen DETECTED (A) NOT DETECTED Final    Comment: CRITICAL RESULT CALLED TO, READ BACK BY AND VERIFIED WITH: RN Livan COLLIER AT 1948 10/22/2022 GAA (NOTE) The GeneXpert MRSA Assay (FDA approved for NASAL specimens only), is one component of a comprehensive MRSA colonization surveillance program. It is not intended to diagnose MRSA infection nor to guide or monitor treatment for MRSA infections. Test performance is not FDA approved in patients less than 44 years old. Performed at [facility], [address] [place], [place] [address]          Radiology Studies: No results found.          LOS: 4 days      Emeterio Reeve, DO [facility] 10/25/2022, 6:45 PM    Dictation software may  have been used to generate the above note. Typos may occur and escape review in typed/dictated notes. Please contact Dr Sheppard Coil directly for clarity if needed.  Staff may message me via secure chat in [place]  but this may not receive an immediate response,  please page me for urgent matters!  If 7PM-7AM, please contact night coverage [email or web address]      .

## 2022-10-25 NOTE — Progress Notes (Signed)
PT Cancellation Note  Patient Details Name: [name] MRN: [id number] DOB: Feb 27, 1946   Cancelled Treatment:     PT attempt. Pt requested author return at a later time. He does endorse already being up to [place] earlier and is planning to DC to SNF today.    Willette Pa 10/25/2022, 9:32 AM

## 2022-10-25 NOTE — TOC Progression Note (Signed)
Transition of Care [facility]) - Progression Note    Patient Details  Name: [name] MRN: [id number] Date of Birth: Nov 08, 1946  Transition of Care [facility]) CM/SW [place], RN Phone Number: 10/25/2022, 9:03 AM  Clinical Narrative:     The patient will go back to [facility] for [employer], Needs a BM and Ins approval,        Expected Discharge Plan and Services                                                 Social Determinants of Health (SDOH) Interventions    Readmission Risk Interventions     No data to display

## 2022-10-26 ENCOUNTER — Inpatient Hospital Stay: Payer: Medicare HMO

## 2022-10-26 DIAGNOSIS — M009 Pyogenic arthritis, unspecified: Secondary | ICD-10-CM | POA: Diagnosis not present

## 2022-10-26 DIAGNOSIS — E1065 Type 1 diabetes mellitus with hyperglycemia: Secondary | ICD-10-CM | POA: Diagnosis not present

## 2022-10-26 DIAGNOSIS — I5022 Chronic systolic (congestive) heart failure: Secondary | ICD-10-CM | POA: Diagnosis not present

## 2022-10-26 DIAGNOSIS — I82452 Acute embolism and thrombosis of left peroneal vein: Secondary | ICD-10-CM | POA: Diagnosis not present

## 2022-10-26 LAB — CBC
HCT: 23.7 % — ABNORMAL LOW (ref 39.0–52.0)
Hemoglobin: 7.6 g/dL — ABNORMAL LOW (ref 13.0–17.0)
MCH: 28.6 pg (ref 26.0–34.0)
MCHC: 32.1 g/dL (ref 30.0–36.0)
MCV: 89.1 fL (ref 80.0–100.0)
Platelets: 341 10*3/uL (ref 150–400)
RBC: 2.66 MIL/uL — ABNORMAL LOW (ref 4.22–5.81)
RDW: 15.2 % (ref 11.5–15.5)
WBC: 8.5 10*3/uL (ref 4.0–10.5)
nRBC: 0 % (ref 0.0–0.2)

## 2022-10-26 LAB — BASIC METABOLIC PANEL
Anion gap: 5 (ref 5–15)
BUN: 20 mg/dL (ref 8–23)
CO2: 34 mmol/L — ABNORMAL HIGH (ref 22–32)
Calcium: 7.9 mg/dL — ABNORMAL LOW (ref 8.9–10.3)
Chloride: 101 mmol/L (ref 98–111)
Creatinine, Ser: 1.1 mg/dL (ref 0.61–1.24)
GFR, Estimated: >60 mL/min (ref >60)
Glucose, Bld: 110 mg/dL — ABNORMAL HIGH (ref 70–99)
Potassium: 3.6 mmol/L (ref 3.5–5.1)
Sodium: 140 mmol/L (ref 135–145)

## 2022-10-26 LAB — URINALYSIS, COMPLETE (UACMP) WITH MICROSCOPIC
Bilirubin Urine: NEGATIVE
Glucose, UA: NEGATIVE mg/dL
Ketones, ur: NEGATIVE mg/dL
Leukocytes,Ua: NEGATIVE
Nitrite: NEGATIVE
Protein, ur: 100 mg/dL — AB
RBC / HPF: >50 RBC/hpf — ABNORMAL HIGH (ref 0–5)
Specific Gravity, Urine: 1.027 (ref 1.005–1.030)
WBC, UA: >50 WBC/hpf — ABNORMAL HIGH (ref 0–5)
pH: 5 (ref 5.0–8.0)

## 2022-10-26 LAB — GLUCOSE, CAPILLARY
Glucose-Capillary: 104 mg/dL — ABNORMAL HIGH (ref 70–99)
Glucose-Capillary: 188 mg/dL — ABNORMAL HIGH (ref 70–99)
Glucose-Capillary: 243 mg/dL — ABNORMAL HIGH (ref 70–99)
Glucose-Capillary: 179 mg/dL — ABNORMAL HIGH (ref 70–99)

## 2022-10-26 LAB — BRAIN NATRIURETIC PEPTIDE: B Natriuretic Peptide: 542.4 pg/mL — ABNORMAL HIGH (ref 0.0–100.0)

## 2022-10-26 LAB — PROCALCITONIN: Procalcitonin: <0.1 ng/mL

## 2022-10-26 MED ORDER — SODIUM CHLORIDE 0.9 % IV SOLN: 12.5000 mg | Freq: Four times a day (QID) | INTRAVENOUS | Status: DC | PRN

## 2022-10-26 MED ORDER — FUROSEMIDE 10 MG/ML IJ SOLN
40.0000 mg | Freq: Once | INTRAMUSCULAR | Status: AC
  Administered 2022-10-26: 40 mg via INTRAVENOUS
  Filled 2022-10-26: qty 4

## 2022-10-26 MED ORDER — IOHEXOL 350 MG/ML SOLN
100.0000 mL | Freq: Once | INTRAVENOUS | Status: AC | PRN
  Administered 2022-10-26: 100 mL via INTRAVENOUS

## 2022-10-26 NOTE — Progress Notes (Addendum)
**Note [name]** PROGRESS NOTE    [name]   GLO:[id number] DOB: May 01, 1946  DOA: 10/21/2022 Date of Service: 10/26/22 PCP: [name], MD     Brief Narrative / Hospital Course:  Patient is a 76 year old male with past medical history of type 1 diabetes mellitus on insulin pump, history of left leg DVT on Eliquis, CAD status post angioplasty, bladder cancer and previous history of MRSA infection who presented to the emergency room on 11/22 and was admitted to the hospitalist service for recurrent septic arthritis of left knee.    Patient underwent irrigation debridement and partial meniscectomy on 11/23 with repeat irrigation debridement on 11/27 and was placed on IV daptomycin for 6 weeks.  He was discharged on 12/4 to skilled nursing.    Patient was seen in the orthopedic office on the morning of 12/11 for suture removal and noted to have purulent drainage coming from the wound.  Orthopedics  took patient to the OR on 12/11 afternoon for repeat arthroscopic debridement and hospitalists were contacted for admission. Hemovac drain removed 10/23/22, possible need for additional washout on 10/24/22. Cultures showing few staph aureus.  Gram stain with rare gram + cocci in singles - pending susceptibilities as of 12/15 AM.  Patient is currently on Daptomycin. Culture came back as (+)MRSA, given lack of substantial improvement, ID would like to change abx and keep him here through the weekend. SNF rehab plan for Monday if doing well.   Evening 12/15 elevated temp and cough, ordered CXR and repeat BCx and UA though primary concern is for knee. 12/16 reviewed results - CXR concerning for interstitial edema/pneumonia worse on R, remains w/ fever, SpO2 documented as 70s on RA asked RN team to reassess and confirm if on O2. AM labs show normal WBC, Hgb to 7.6. UA not collected. Asked ID on call (Dr Linus Salmons) to review abx and determine if any changes needed - he confirmed ok to continue w/ current plan. UA did  return w/ large Hgb, no leuks/WBC but showing >50 WBC/HPF on micorscopy - await culture results. Serum WBC normal - not meeting sepsis criteria. Hypoxic on room air improved on [place].     Consultants:  Orthopedic surgery Infectious disease   Procedures: 10/21/22 Arthroscopic irrigation debridement of left knee (Dr [name] - ortho surgery)        ASSESSMENT & PLAN:   Principal Problem:   Septic arthritis of knee, left ([place]) Active Problems:   Chronic systolic CHF (congestive heart failure) ([place])   Uncontrolled type 1 diabetes mellitus with hyperglycemia, with long-term current use of insulin ([name])   DVT (deep venous thrombosis) ([name])   HYPERTENSION, BENIGN   Chronic constipation   Overweight (BMI 25.0-29.9)   Septic arthritis of knee, left ([place]) MRSA Appreciate orthopedic intervention.   Status post repeat arthroscopic debridement 10/21/22.   Continue IV antibiotics per ID --> added ceftaroline to daptomycin 10/25/22    Fever Concern for bacteremia vs pneumonia vs UTI Continue current abx  Await repeat cultures   Acute hypoxic respiratory failure Await BNP and procalcitonin to eval PNA vs CHF He is on Eliquis but does have recent DVT, given hypoxia will get CTA chest which should also further characterize CXR findings   Chronic systolic CHF (congestive heart failure) ([place]) At patient's last hospitalization, 2D echo and TEE confirmed ejection fraction of 35 to 40%.  BNP minimally elevated, has received 1 dose of IV Lasix on this admission  No rales on exam today  Pending CT chest may consider  another dose Lasix if no obvious pneumonia or PE   Uncontrolled type 1 diabetes mellitus with hyperglycemia, with long-term current use of insulin ([place]) Resume home medications and sliding scale   DVT (deep venous thrombosis) ([name]) on Eliquis.     HYPERTENSION, BENIGN Stable. Hypotension secondary to anesthesia.   Will resume home medications when blood pressure starts to rise    Chronic constipation Patient has issues with chronic constipation.   He states he has not had a bowel movement since the day he discharged a week prior to this admission.   Will start some Colace and lactulose.   Overweight (BMI 25.0-29.9) Meets criteria BMI greater than 25    DVT prophylaxis: eliquis Pertinent IV fluids/nutrition: no continuous IV fluids Central lines / invasive devices: none  Code Status: FULL CODE  Disposition: inpatient TOC needs: SNF Barriers to discharge / significant pending items: here thru the weekend per ID recs on new IV abx and if improving / stable on Monday plan for discharge to SNF             Subjective:  Patient reports no concerns - no subjective fever Denies CP Feels slightly SOB but better on O2.  Pain controlled.  Denies new weakness.  Tolerating diet.  Reports no concerns w/ urination/defecation.   Family Communication: extensive discussion w/ partner by phone at bedside on rounds all questions answered    Objective Findings:  Vitals:   10/25/22 1800 10/25/22 2358 10/26/22 0723 10/26/22 0808  BP:  (!) 115/50 (!) 136/51   Pulse:  83 79   Resp:  18 16   Temp: 99.3 F (37.4 C) 100.2 F (37.9 C) 99.5 F (37.5 C)   TempSrc:      SpO2:  (!) 79% (!) 77% (!) 89%  Weight:      Height:        Intake/Output Summary (Last 24 hours) at 10/26/2022 1329 Last data filed at 10/26/2022 2707 Gross per 24 hour  Intake 225.86 ml  Output --  Net 225.86 ml   Filed Weights   10/21/22 1454  Weight: 86.2 kg    Examination:  Constitutional:  VS as above General Appearance: alert, NAD Respiratory: Normal respiratory effort No wheeze No rhonchi No rales Cardiovascular: S1/S2 normal No rub/gallop auscultated Gastrointestinal: No tenderness Musculoskeletal:  Symmetrical movement in all extremities Neurological: No cranial nerve deficit on limited exam Alert Psychiatric: Normal judgment/insight Normal mood and  affect       Scheduled Medications:   apixaban  5 mg Oral BID   aspirin EC  81 mg Oral Daily   atorvastatin  40 mg Oral Daily   carvedilol  6.25 mg Oral BID WC   Chlorhexidine Gluconate Cloth  6 each Topical Daily   docusate sodium  100 mg Oral BID   furosemide  40 mg Oral Daily   insulin aspart  0-5 Units Subcutaneous QHS   insulin aspart  0-9 Units Subcutaneous TID WC   insulin glargine-yfgn  28 Units Subcutaneous Daily   lactulose  20 g Oral Daily   multivitamin with minerals  1 tablet Oral Daily   pantoprazole  40 mg Oral Daily   polyethylene glycol  17 g Oral Once   senna-docusate  2 tablet Oral Once   sodium chloride flush  10-40 mL Intracatheter Q12H    Continuous Infusions:  sodium chloride Stopped (10/23/22 2222)   ceFTAROline (TEFLARO) IV 600 mg (10/26/22 1305)   DAPTOmycin (CUBICIN) 700 mg in sodium chloride 0.9 %  IVPB 128 mL/hr at 10/26/22 0742   promethazine (PHENERGAN) injection (IM or IVPB)      PRN Medications:  acetaminophen **OR** acetaminophen, diphenhydrAMINE, HYDROmorphone (DILAUDID) injection, hyoscyamine, ondansetron **OR** ondansetron (ZOFRAN) IV, mouth rinse, oxyCODONE, promethazine (PHENERGAN) injection (IM or IVPB), sodium chloride flush, Uribel  Antimicrobials:  Anti-infectives (From admission, onward)    Start     Dose/Rate Route Frequency Ordered Stop   10/25/22 1530  ceftaroline (TEFLARO) 600 mg in sodium chloride 0.9 % 100 mL IVPB        600 mg 100 mL/hr over 60 Minutes Intravenous Every 8 hours 10/25/22 1420     10/21/22 2200  DAPTOmycin (CUBICIN) 700 mg in sodium chloride 0.9 % IVPB        700 mg 128 mL/hr over 30 Minutes Intravenous Every 24 hours 10/21/22 1854     10/21/22 1930  daptomycin (CUBICIN) IVPB  Status:  Discontinued        700 mg Intravenous Every 24 hours 10/21/22 1835 10/21/22 1854   10/21/22 1835  Uribel 118 MG CAPS 118 mg        1 capsule Oral Every 6 hours PRN 10/21/22 1835     10/21/22 1447  ceFAZolin (ANCEF) IVPB  2g/100 mL premix        2 g 200 mL/hr over 30 Minutes Intravenous 60 min pre-op 10/21/22 1445 10/21/22 1557   10/21/22 1447  ceFAZolin (ANCEF) 2-4 GM/100ML-% IVPB       Note to Pharmacy: Norton Blizzard A: cabinet override      10/21/22 1447 10/21/22 1543           Data Reviewed: I have personally reviewed following labs and imaging studies  CBC: Recent Labs  Lab 10/22/22 0515 10/23/22 0530 10/24/22 0620 10/25/22 0600 10/26/22 0530  WBC 10.5 11.9* 11.9* 11.0* 8.5  HGB 8.3* 8.5* 8.0* 8.3* 7.6*  HCT 25.9* 25.9* 24.3* 25.4* 23.7*  MCV 90.9 89.3 88.4 88.2 89.1  PLT 337 356 346 394 409   Basic Metabolic Panel: Recent Labs  Lab 10/22/22 0515 10/23/22 0530 10/24/22 0620 10/25/22 0600 10/26/22 0530  NA 140 140 139 140 140  K 3.7 3.5 3.3* 3.3* 3.6  CL 103 100 101 101 101  CO2 31 33* 33* 33* 34*  GLUCOSE 156* 192* 211* 124* 110*  BUN '23 23 22 18 20  '$ CREATININE 1.11 1.20 1.08 0.95 1.10  CALCIUM 7.7* 7.9* 7.7* 7.9* 7.9*   GFR: Estimated Creatinine Clearance: 63.1 mL/min (by C-G formula based on SCr of 1.1 mg/dL). Liver Function Tests: Recent Labs  Lab 10/21/22 2141 10/22/22 0515  AST 20 18  ALT 18 15  ALKPHOS 113 109  BILITOT 0.5 0.7  PROT 5.8* 5.4*  ALBUMIN 2.0* 1.8*   No results for input(s): "LIPASE", "AMYLASE" in the last 168 hours. No results for input(s): "AMMONIA" in the last 168 hours. Coagulation Profile: No results for input(s): "INR", "PROTIME" in the last 168 hours. Cardiac Enzymes: Recent Labs  Lab 10/22/22 0515  CKTOTAL 23*   BNP (last 3 results) No results for input(s): "PROBNP" in the last 8760 hours. HbA1C: No results for input(s): "HGBA1C" in the last 72 hours. CBG: Recent Labs  Lab 10/25/22 1357 10/25/22 1637 10/25/22 2100 10/26/22 0725 10/26/22 1121  GLUCAP 135* 138* 89 104* 188*   Lipid Profile: No results for input(s): "CHOL", "HDL", "LDLCALC", "TRIG", "CHOLHDL", "LDLDIRECT" in the last 72 hours. Thyroid Function  Tests: No results for input(s): "TSH", "T4TOTAL", "FREET4", "T3FREE", "THYROIDAB" in the last 72  hours. Anemia Panel: No results for input(s): "VITAMINB12", "FOLATE", "FERRITIN", "TIBC", "IRON", "RETICCTPCT" in the last 72 hours. Most Recent Urinalysis On File:     Component Value Date/Time   COLORURINE AMBER (A) 10/26/2022 1251   APPEARANCEUR CLOUDY (A) 10/26/2022 1251   APPEARANCEUR Clear 06/08/2015 1343   LABSPEC 1.027 10/26/2022 1251   PHURINE 5.0 10/26/2022 1251   GLUCOSEU NEGATIVE 10/26/2022 1251   HGBUR LARGE (A) 10/26/2022 1251   BILIRUBINUR NEGATIVE 10/26/2022 1251   BILIRUBINUR Negative 06/08/2015 1343   KETONESUR NEGATIVE 10/26/2022 1251   PROTEINUR 100 (A) 10/26/2022 1251   NITRITE NEGATIVE 10/26/2022 1251   LEUKOCYTESUR NEGATIVE 10/26/2022 1251   Sepsis Labs: '@LABRCNTIP'$ (procalcitonin:4,lacticidven:4)  Recent Results (from the past 240 hour(s))  Aerobic/Anaerobic Culture w Gram Stain (surgical/deep wound)     Status: None (Preliminary result)   Collection Time: 10/21/22  4:01 PM   Specimen: PATH Cytology Misc. fluid; Body Fluid  Result Value Ref Range Status   Specimen Description   Final    WOUND Performed at [facility], [address] [place], [place] [address]    Special Requests   Final    NONE Performed at [facility], [place], [place] [address]    Gram Stain   Final    FEW WBC PRESENT,BOTH PMN AND MONONUCLEAR RARE GRAM POSITIVE COCCI IN SINGLES    Culture   Final    FEW METHICILLIN RESISTANT STAPHYLOCOCCUS AUREUS NO ANAEROBES ISOLATED Sent to [facility] for further susceptibility testing. Performed at [facility], [place] [address] [place], [place] [address]    Report Status PENDING  Incomplete   Organism ID, Bacteria METHICILLIN RESISTANT STAPHYLOCOCCUS AUREUS  Final      Susceptibility   Methicillin resistant staphylococcus aureus - MIC*    CIPROFLOXACIN >=8 RESISTANT Resistant     ERYTHROMYCIN >=8  RESISTANT Resistant     GENTAMICIN <=0.5 SENSITIVE Sensitive     OXACILLIN RESISTANT Resistant     TETRACYCLINE <=1 SENSITIVE Sensitive     VANCOMYCIN 2 SENSITIVE Sensitive     TRIMETH/SULFA <=10 SENSITIVE Sensitive     CLINDAMYCIN <=0.25 SENSITIVE Sensitive     RIFAMPIN <=0.5 SENSITIVE Sensitive     Inducible Clindamycin NEGATIVE Sensitive     LINEZOLID Value in next row Sensitive      SENSITIVEMIC 2    * FEW METHICILLIN RESISTANT STAPHYLOCOCCUS AUREUS  MRSA Next Gen by PCR, Nasal     Status: Abnormal   Collection Time: 10/22/22 10:50 AM   Specimen: Nasal Mucosa; Nasal Swab  Result Value Ref Range Status   MRSA by PCR Next Gen DETECTED (A) NOT DETECTED Final    Comment: CRITICAL RESULT CALLED TO, READ BACK BY AND VERIFIED WITH: RN Raymir COLLIER AT 1948 10/22/2022 GAA (NOTE) The GeneXpert MRSA Assay (FDA approved for NASAL specimens only), is one component of a comprehensive MRSA colonization surveillance program. It is not intended to diagnose MRSA infection nor to guide or monitor treatment for MRSA infections. Test performance is not FDA approved in patients less than 58 years old. Performed at [facility], [address] [place], [place] [address]   Culture, blood (Routine X 2) w Reflex to ID Panel     Status: None (Preliminary result)   Collection Time: 10/25/22  8:38 PM   Specimen: BLOOD  Result Value Ref Range Status   Specimen Description BLOOD BLOOD LEFT HAND  Final   Special Requests   Final    BOTTLES DRAWN AEROBIC  AND ANAEROBIC Blood Culture adequate volume   Culture   Final    NO GROWTH < 24 HOURS Performed at [facility], [address] [place], [place] [address]    Report Status PENDING  Incomplete  Culture, blood (Routine X 2) w Reflex to ID Panel     Status: None (Preliminary result)   Collection Time: 10/25/22  8:38 PM   Specimen: BLOOD  Result Value Ref Range Status   Specimen Description BLOOD BLOOD RIGHT HAND  Final   Special  Requests   Final    IN PEDIATRIC BOTTLE Blood Culture results may not be optimal due to an excessive volume of blood received in culture bottles   Culture   Final    NO GROWTH < 24 HOURS Performed at [facility], [address], [place], [place] [address]    Report Status PENDING  Incomplete         Radiology Studies: No results found.          LOS: 5 days    Time spent 50 minutes   Emeterio Reeve, DO [facility] 10/26/2022, 1:29 PM    Dictation software may have been used to generate the above note. Typos may occur and escape review in typed/dictated notes. Please contact Dr Sheppard Coil directly for clarity if needed.  Staff may message me via secure chat in [place]  but this may not receive an immediate response,  please page me for urgent matters!  If 7PM-7AM, please contact night coverage [email or web address]      .

## 2022-10-26 NOTE — Progress Notes (Addendum)
Occupational Therapy Treatment Patient Details Name: [name] MRN: [id number] DOB: [birth year] Today's Date: 10/26/2022   History of present illness Patient here septic left knee, s/p I & D. WBAT. Pt. has had multiple recent similar admissions.   OT comments  Pt. reports that he had just returned to bed after sitting up most of the day. SpO2 97% on 2LO2. Pt. education was provided about A/E use for LE ADLs, pursed lip breathing techniques, energy conservation, and work simplification techniques. Pt. Continues to benefit from OT services for ADL training, A/E training, and pt. education about energy conservation, work simplification, home modification, and DME. Pt. would benefit from SNF level of care upon discharge, with follow-up OT services.    Recommendations for follow up therapy are one component of a multi-disciplinary discharge planning process, led by the attending physician.  Recommendations may be updated based on patient status, additional functional criteria and insurance authorization.    Follow Up Recommendations  Skilled nursing-short term rehab (<3 hours/day)     Assistance Recommended at Discharge Frequent or constant Supervision/Assistance  Patient can return home with the following  A lot of help with bathing/dressing/bathroom;A little help with walking and/or transfers;Assistance with cooking/housework;Help with stairs or ramp for entrance;Assist for transportation;Direct supervision/assist for financial management;Direct supervision/assist for medications management   Equipment Recommendations       Recommendations for Other Services      Precautions / Restrictions Precautions Precautions: Fall Restrictions Weight Bearing Restrictions: Yes LLE Weight Bearing: Weight bearing as tolerated       Mobility Bed Mobility Overal bed mobility: Modified Independent Bed Mobility: Supine to Sit     Supine to sit: Supervision Sit to supine: Mod assist         Transfers Overall transfer level: Modified independent Equipment used: Rolling walker (2 wheels) Transfers: Sit to/from Stand Sit to Stand: Min guard                 Balance                                           ADL either performed or assessed with clinical judgement   ADL                       Lower Body Dressing: Moderate assistance                      Extremity/Trunk Assessment Upper Extremity Assessment Upper Extremity Assessment: Generalized weakness            Vision Patient Visual Report: No change from baseline     Perception     Praxis      Cognition Arousal/Alertness: Awake/alert                                              Exercises      Shoulder Instructions       General Comments      Pertinent Vitals/ Pain       Pain Assessment Pain Assessment: No/denies pain  Home Living  Prior Functioning/Environment              Frequency  Min 2X/week        Progress Toward Goals  OT Goals(current goals can now be found in the care plan section)  Progress towards OT goals: Progressing toward goals  Acute Rehab OT Goals OT Goal Formulation: With patient Time For Goal Achievement: 11/06/22 Potential to Achieve Goals: Good  Plan      Co-evaluation                 AM-PAC OT "6 Clicks" Daily Activity     Outcome Measure   Help from another person eating meals?: None Help from another person taking care of personal grooming?: None Help from another person toileting, which includes using toliet, bedpan, or urinal?: A Lot Help from another person bathing (including washing, rinsing, drying)?: A Lot Help from another person to put on and taking off regular upper body clothing?: A Little Help from another person to put on and taking off regular lower body clothing?: A Lot 6 Click Score: 17    End of  Session Equipment Utilized During Treatment: Rolling walker (2 wheels);Gait belt  OT Visit Diagnosis: Unsteadiness on feet (R26.81);Repeated falls (R29.6);Muscle weakness (generalized) (M62.81);Pain Pain - Right/Left: Left Pain - part of body: Knee   Activity Tolerance Patient tolerated treatment well;Patient limited by pain   Patient Left in chair;with call bell/phone within reach;with chair alarm set   Nurse Communication          Time: 9450-3888 OT Time Calculation (min): 23 min  Charges: OT General Charges $OT Visit: 1 Visit OT Treatments $Self Care/Home Management : 8-22 mins  Harrel Carina, MS, OTR/L   Harrel Carina 10/26/2022, 3:18 PM

## 2022-10-26 NOTE — Progress Notes (Signed)
  Subjective: 5 Days Post-Op Procedure(s) (LRB): ARTHROSCOPIC INCISION AND DRAINAGE KNEE-LEFT (Left) Patient reports he has no pain in the knee at this time. Partner at bedside.   Patient is well, and has had no acute complaints or problems. Did experience hypoxia this AM with therapy but reports this has resolved.  Plan is to go Skilled nursing facility after hospital stay.   Objective: Vital signs in last 24 hours: Temp:  [99.3 F (37.4 C)-100.2 F (37.9 C)] 99.5 F (37.5 C) (12/16 0723) Pulse Rate:  [79-91] 79 (12/16 0723) Resp:  [16-20] 16 (12/16 0723) BP: (115-136)/(50-62) 136/51 (12/16 0723) SpO2:  [77 %-91 %] 89 % (12/16 0808)  Intake/Output from previous day:  Intake/Output Summary (Last 24 hours) at 10/26/2022 1123 Last data filed at 10/26/2022 0742 Gross per 24 hour  Intake 225.86 ml  Output 275 ml  Net -49.14 ml    Intake/Output this shift: Total I/O In: 128 [IV Piggyback:128] Out: -   Labs: Recent Labs    10/24/22 0620 10/25/22 0600 10/26/22 0530  HGB 8.0* 8.3* 7.6*   Recent Labs    10/25/22 0600 10/26/22 0530  WBC 11.0* 8.5  RBC 2.88* 2.66*  HCT 25.4* 23.7*  PLT 394 341   Recent Labs    10/25/22 0600 10/26/22 0530  NA 140 140  K 3.3* 3.6  CL 101 101  CO2 33* 34*  BUN 18 20  CREATININE 0.95 1.10  GLUCOSE 124* 110*  CALCIUM 7.9* 7.9*   No results for input(s): "LABPT", "INR" in the last 72 hours.   EXAM General - Patient is Alert, Appropriate, and Oriented Extremity - Neurovascular intact Dorsiflexion/Plantar flexion intact Dressing/Incision -Post-op dressing in place. Following removal, minimal erythema noted over knee, nontender to palpation of the knee. Patient able to actively flex and extend without pain.   Assessment/Plan: 5 Days Post-Op Procedure(s) (LRB): ARTHROSCOPIC INCISION AND DRAINAGE KNEE-LEFT (Left) Principal Problem:   Septic arthritis of knee, left ([name]) Active Problems:   HYPERTENSION, BENIGN   DVT (deep  venous thrombosis) ([place])   Uncontrolled type 1 diabetes mellitus with hyperglycemia, with long-term current use of insulin ([name])   Chronic systolic CHF (congestive heart failure) ([name])   Overweight (BMI 25.0-29.9)   Chronic constipation  Estimated body mass index is 28.06 kg/m as calculated from the following:   Height as of this encounter: '5\' 9"'$  (1.753 m).   Weight as of this encounter: 86.2 kg. Advance diet Up with therapy  Labs reviewed, WBC down to 8.5 Awaiting placement to SNF, unfortunately they do not accept patients on weekends. Patient advised d/c likely Monday.   Continue with IV abx.     DVT Prophylaxis - Eliquis and Ted hose Weight-Bearing as tolerated to left leg  Cassell Smiles, [name] [facility] 10/26/2022, 11:23 AM

## 2022-10-26 NOTE — Plan of Care (Signed)

## 2022-10-26 NOTE — Progress Notes (Signed)
Physical Therapy Treatment Patient Details Name: [name] MRN: [id number] DOB: Jan 26, 1946 Today's Date: 10/26/2022   History of Present Illness Patient here with septic left knee, s/p I & D. WBAT. Here recently for same.    PT Comments    Pt was alert, supine in bed, with HOB elevated ~ 20 degrees. Supportive partner at bedside. Pt endorses nausea and requested to go to BR. Unable to make it to toilet prior to having episode incontinent episode. Cognition continues to be altered form baseline. Disoriented x 2 but able to consistently follow commands. Only endorses 2/10 pain in wt bearing. Was not premedicated for pain prior to session. Very loose watery stool noted. Pt also had several occasions of O2 saturation dropping below 88 %. 2 L o2 applied with MD/RN made aware. Overall pt has had poor intake in past 24 hours with nausea/vomiting noted. After hygiene care from [facility] was performed, has active vomiting. Mostly dry heaving noted. Pt is moving well from a mobility-PT standpoint. Will progress gait,strength, and safe functional mobility as able per current POC. SNF at DC remains appropriate. Pt is sitting in recliner with emesis bag in hand and prevlon boots in place. Encouraged intake of food.     Recommendations for follow up therapy are one component of a multi-disciplinary discharge planning process, led by the attending physician.  Recommendations may be updated based on patient status, additional functional criteria and insurance authorization.  Follow Up Recommendations  Skilled nursing-short term rehab (<3 hours/day)     Assistance Recommended at Discharge Intermittent Supervision/Assistance  Patient can return home with the following A little help with walking and/or transfers;Assistance with cooking/housework;Assist for transportation;Help with stairs or ramp for entrance;A little help with bathing/dressing/bathroom;Direct supervision/assist for medications management    Equipment Recommendations  None recommended by PT    Recommendations for Other Services       Precautions / Restrictions Precautions Precautions: Fall Restrictions Weight Bearing Restrictions: Yes LLE Weight Bearing: Weight bearing as tolerated     Mobility  Bed Mobility Overal bed mobility: Modified Independent Bed Mobility: Supine to Sit  Supine to sit: Supervision  General bed mobility comments: Pt was safely and easily able to exit bed without physical assistance or VCs    Transfers Overall transfer level: Modified independent Equipment used: Rolling walker (2 wheels) Transfers: Sit to/from Stand Sit to Stand: Min guard    General transfer comment: CGA to stand form lowest bed height    Ambulation/Gait Ambulation/Gait assistance: Supervision Gait Distance (Feet): 30 Feet Assistive device: Rolling walker (2 wheels) Gait Pattern/deviations: Step-through pattern, Antalgic Gait velocity: decreased     General Gait Details: pt was easily able to ambulate to/from BR. Gait distance limited by vomiting and loose stools. pt has had poor intake over past 24 hours with nausea presistent.    Balance Overall balance assessment: Needs assistance Sitting-balance support: Feet supported Sitting balance-Leahy Scale: Good     Standing balance support: Bilateral upper extremity supported Standing balance-Leahy Scale: Fair Standing balance comment: reliant on RW for dynamic standing activity       Cognition Arousal/Alertness: Awake/alert Behavior During Therapy: WFL for tasks assessed/performed, Flat affect Overall Cognitive Status: Impaired/Different from baseline Area of Impairment: Safety/judgement, Problem solving      Following Commands: Follows one step commands consistently, Follows one step commands with increased time Safety/Judgement: Decreased awareness of safety, Decreased awareness of deficits Awareness: Intellectual Problem Solving: Slow processing,  Difficulty sequencing, Requires verbal cues General Comments: Pt is alert and  cooperative but remains disoriented overall. Oriented to self but disoriented to day, overall situation, and not at baseline cognition. Sao2 dropping at times to 86%. applied 2L with RN/MD made aware. Nausea limits session progression.               Pertinent Vitals/Pain Pain Assessment Pain Assessment: 0-10 Pain Score: 2  Pain Location: L knee Pain Descriptors / Indicators: Discomfort, Sore Pain Intervention(s): Limited activity within patient's tolerance, Premedicated before session, Monitored during session, Repositioned     PT Goals (current goals can now be found in the care plan section) Acute Rehab PT Goals Patient Stated Goal: none stated Progress towards PT goals: Progressing toward goals    Frequency    7X/week      PT Plan Current plan remains appropriate    Co-evaluation     PT goals addressed during session: Mobility/safety with mobility;Balance;Proper use of DME;Strengthening/ROM        AM-PAC PT "6 Clicks" Mobility   Outcome Measure  Help needed turning from your back to your side while in a flat bed without using bedrails?: A Little Help needed moving from lying on your back to sitting on the side of a flat bed without using bedrails?: A Little Help needed moving to and from a bed to a chair (including a wheelchair)?: A Little Help needed standing up from a chair using your arms (e.g., wheelchair or bedside chair)?: A Little Help needed to walk in hospital room?: A Little Help needed climbing 3-5 steps with a railing? : A Little 6 Click Score: 18    End of Session   Activity Tolerance: Other (comment);Treatment limited secondary to medical complications (Comment) (limited by nausea and loose stols) Patient left: in chair;with call bell/phone within reach;with chair alarm set;Other (comment) (Prevlon boots donned) Nurse Communication: Mobility status PT Visit Diagnosis:  Muscle weakness (generalized) (M62.81);Difficulty in walking, not elsewhere classified (R26.2);Pain     Time: 7544-9201 PT Time Calculation (min) (ACUTE ONLY): 25 min  Charges:  $Gait Training: 8-22 mins $Therapeutic Activity: 8-22 mins                     Julaine Fusi PTA 10/26/22, 9:02 AM

## 2022-10-27 DIAGNOSIS — I82452 Acute embolism and thrombosis of left peroneal vein: Secondary | ICD-10-CM | POA: Diagnosis not present

## 2022-10-27 DIAGNOSIS — M009 Pyogenic arthritis, unspecified: Secondary | ICD-10-CM | POA: Diagnosis not present

## 2022-10-27 DIAGNOSIS — I5022 Chronic systolic (congestive) heart failure: Secondary | ICD-10-CM | POA: Diagnosis not present

## 2022-10-27 DIAGNOSIS — E1065 Type 1 diabetes mellitus with hyperglycemia: Secondary | ICD-10-CM | POA: Diagnosis not present

## 2022-10-27 LAB — URINE CULTURE: Culture: NO GROWTH

## 2022-10-27 LAB — GLUCOSE, CAPILLARY
Glucose-Capillary: 201 mg/dL — ABNORMAL HIGH (ref 70–99)
Glucose-Capillary: 182 mg/dL — ABNORMAL HIGH (ref 70–99)
Glucose-Capillary: 155 mg/dL — ABNORMAL HIGH (ref 70–99)
Glucose-Capillary: 118 mg/dL — ABNORMAL HIGH (ref 70–99)

## 2022-10-27 MED ORDER — CARVEDILOL 3.125 MG PO TABS
3.1250 mg | ORAL_TABLET | Freq: Two times a day (BID) | ORAL | Status: DC
  Administered 2022-10-28 – 2022-11-01 (×7): 3.125 mg via ORAL
  Filled 2022-10-27 (×8): qty 1

## 2022-10-27 MED ORDER — GUAIFENESIN-CODEINE 100-10 MG/5ML PO SOLN
5.0000 mL | ORAL | Status: DC | PRN
  Administered 2022-10-27 – 2022-11-01 (×7): 5 mL via ORAL
  Filled 2022-10-27 (×7): qty 5

## 2022-10-27 MED ORDER — FUROSEMIDE 40 MG PO TABS
40.0000 mg | ORAL_TABLET | Freq: Two times a day (BID) | ORAL | Status: DC
  Administered 2022-10-28: 40 mg via ORAL
  Filled 2022-10-27 (×2): qty 1

## 2022-10-27 NOTE — Progress Notes (Signed)
  Subjective: 6 Days Post-Op Procedure(s) (LRB): ARTHROSCOPIC INCISION AND DRAINAGE KNEE-LEFT (Left) Patient reports no pain in the operative knee at this time.  Patient is well, and has had no acute complaints or problems Plan is to go Skilled nursing facility after hospital stay. Negative for chest pain and shortness of breath Fever: no Gastrointestinal: negative for nausea and vomiting.    Objective: Vital signs in last 24 hours: Temp:  [98.1 F (36.7 C)-98.5 F (36.9 C)] 98.1 F (36.7 C) (12/17 0732) Pulse Rate:  [70-80] 70 (12/17 0732) Resp:  [16-18] 16 (12/17 0732) BP: (120-126)/(60-63) 126/63 (12/17 0732) SpO2:  [94 %-98 %] 94 % (12/17 0732)  Intake/Output from previous day:  Intake/Output Summary (Last 24 hours) at 10/27/2022 1049 Last data filed at 10/27/2022 0739 Gross per 24 hour  Intake 566.14 ml  Output 600 ml  Net -33.86 ml    Intake/Output this shift: Total I/O In: 264 [IV Piggyback:264] Out: -   Labs: Recent Labs    10/25/22 0600 10/26/22 0530  HGB 8.3* 7.6*   Recent Labs    10/25/22 0600 10/26/22 0530  WBC 11.0* 8.5  RBC 2.88* 2.66*  HCT 25.4* 23.7*  PLT 394 341   Recent Labs    10/25/22 0600 10/26/22 0530  NA 140 140  K 3.3* 3.6  CL 101 101  CO2 33* 34*  BUN 18 20  CREATININE 0.95 1.10  GLUCOSE 124* 110*  CALCIUM 7.9* 7.9*   No results for input(s): "LABPT", "INR" in the last 72 hours.   EXAM General - Patient is Alert, Appropriate, and Oriented Extremity - Neurovascular intact Dorsiflexion/Plantar flexion intact Compartment soft Dressing/Incision -clean, dry, no drainage, essentially no erythema over knee  Motor Function - intact, moving foot and toes well on exam. Able to perform independent SLR. Can flex and extend knee without pain.    Assessment/Plan: 6 Days Post-Op Procedure(s) (LRB): ARTHROSCOPIC INCISION AND DRAINAGE KNEE-LEFT (Left) Principal Problem:   Septic arthritis of knee, left ([name]) Active Problems:    HYPERTENSION, BENIGN   DVT (deep venous thrombosis) ([place])   Uncontrolled type 1 diabetes mellitus with hyperglycemia, with long-term current use of insulin ([name])   Chronic systolic CHF (congestive heart failure) ([name])   Overweight (BMI 25.0-29.9)   Chronic constipation  Estimated body mass index is 28.06 kg/m as calculated from the following:   Height as of this encounter: '5\' 9"'$  (1.753 m).   Weight as of this encounter: 86.2 kg. Advance diet Up with therapy  Dressing changed over the left knee, new honeycomb dressings placed.  Continue IV abx.   Continue with   DVT Prophylaxis - Eliquis and Ted hose Weight-Bearing as tolerated to left leg  Cassell Smiles, [name] [facility] 10/27/2022, 10:49 AM

## 2022-10-27 NOTE — Progress Notes (Signed)
Physical Therapy Treatment Patient Details Name: [name] MRN: [id number] DOB: 1946/04/18 Today's Date: 10/27/2022   History of Present Illness Patient here septic left knee, s/p I & D. WBAT. Pt. has had multiple recent similar admissions.    PT Comments    Pt ready for session.  To EOB with rails and cues for hand placements. Steady in sitting,  He is able to stand with min a x 1 after cues to correct hand placements.  Stands to void then walks around bed with slow antalgic gait tos ink to brush his teeth.  Seated rest on bed before walking back around to sit in recliner.  Does not reach back for chair to sit.  Appears fatigued with activity but overall tolerates well.     Recommendations for follow up therapy are one component of a multi-disciplinary discharge planning process, led by the attending physician.  Recommendations may be updated based on patient status, additional functional criteria and insurance authorization.  Follow Up Recommendations  Skilled nursing-short term rehab (<3 hours/day)     Assistance Recommended at Discharge Intermittent Supervision/Assistance  Patient can return home with the following A little help with walking and/or transfers;Assistance with cooking/housework;Assist for transportation;Help with stairs or ramp for entrance;A little help with bathing/dressing/bathroom;Direct supervision/assist for medications management   Equipment Recommendations  None recommended by PT    Recommendations for Other Services       Precautions / Restrictions Precautions Precautions: Fall Restrictions Weight Bearing Restrictions: Yes LLE Weight Bearing: Weight bearing as tolerated     Mobility  Bed Mobility Overal bed mobility: Needs Assistance Bed Mobility: Supine to Sit     Supine to sit: Supervision Sit to supine: Min assist, Mod assist   General bed mobility comments: cues for hand placements for bed mobilty    Transfers Overall transfer  level: Needs assistance Equipment used: Rolling walker (2 wheels)   Sit to Stand: Min assist           General transfer comment: cues for hand placements both standing and sitting.    Ambulation/Gait Ambulation/Gait assistance: Min guard Gait Distance (Feet): 25 Feet Assistive device: Rolling walker (2 wheels) Gait Pattern/deviations: Step-through pattern, Antalgic Gait velocity: decreased     General Gait Details: limited by L knee pain.  no nausea or vomitting or loose stools today   Stairs             Wheelchair Mobility    Modified Rankin (Stroke Patients Only)       Balance Overall balance assessment: Needs assistance Sitting-balance support: Feet supported Sitting balance-Leahy Scale: Good     Standing balance support: Bilateral upper extremity supported Standing balance-Leahy Scale: Fair Standing balance comment: reliant on RW for dynamic standing activity                            Cognition Arousal/Alertness: Awake/alert Behavior During Therapy: WFL for tasks assessed/performed, Flat affect Overall Cognitive Status: Within Functional Limits for tasks assessed                                          Exercises Other Exercises Other Exercises: brushing teeth in standing at sink.  cues for proper positioning as he tries to stand sideways at sink.    General Comments        Pertinent Vitals/Pain Pain Assessment Pain  Assessment: Faces Faces Pain Scale: Hurts little more Pain Location: L knee Pain Descriptors / Indicators: Discomfort, Sore Pain Intervention(s): Limited activity within patient's tolerance, Monitored during session, Repositioned    Home Living                          Prior Function            PT Goals (current goals can now be found in the care plan section) Progress towards PT goals: Progressing toward goals    Frequency    7X/week      PT Plan Current plan remains  appropriate    Co-evaluation              AM-PAC PT "6 Clicks" Mobility   Outcome Measure  Help needed turning from your back to your side while in a flat bed without using bedrails?: A Little Help needed moving from lying on your back to sitting on the side of a flat bed without using bedrails?: A Little Help needed moving to and from a bed to a chair (including a wheelchair)?: A Little Help needed standing up from a chair using your arms (e.g., wheelchair or bedside chair)?: A Little Help needed to walk in hospital room?: A Little Help needed climbing 3-5 steps with a railing? : A Lot 6 Click Score: 17    End of Session Equipment Utilized During Treatment: Gait belt Activity Tolerance: Patient tolerated treatment well Patient left: in chair;with call bell/phone within reach;with chair alarm set Nurse Communication: Mobility status PT Visit Diagnosis: Muscle weakness (generalized) (M62.81);Difficulty in walking, not elsewhere classified (R26.2);Pain Pain - Right/Left: Left Pain - part of body: Knee     Time: 8341-9622 PT Time Calculation (min) (ACUTE ONLY): 24 min  Charges:  $Gait Training: 8-22 mins $Therapeutic Activity: 8-22 mins                    Chesley Noon, PTA 10/27/22, 9:32 AM

## 2022-10-27 NOTE — Progress Notes (Signed)
PROGRESS NOTE    [name]   LNL:[id number] DOB: 04-03-1946  DOA: 10/21/2022 Date of Service: 10/27/22 PCP: [name], MD     Brief Narrative / Hospital Course:  Patient is a 76 year old male with past medical history of type 1 diabetes mellitus on insulin pump, history of left leg DVT on Eliquis, CAD status post angioplasty, bladder cancer and previous history of MRSA infection who presented to the emergency room on 11/22 and was admitted to the hospitalist service for recurrent septic arthritis of left knee.    Patient underwent irrigation debridement and partial meniscectomy on 11/23 with repeat irrigation debridement on 11/27 and was placed on IV daptomycin for 6 weeks.  He was discharged on 12/4 to skilled nursing.    Patient was seen in the orthopedic office on the morning of 12/11 for suture removal and noted to have purulent drainage coming from the wound.  Orthopedics  took patient to the OR on 12/11 afternoon for repeat arthroscopic debridement and hospitalists were contacted for admission. Hemovac drain removed 10/23/22, possible need for additional washout on 10/24/22. Cultures showing few staph aureus.  Gram stain with rare gram + cocci in singles - pending susceptibilities as of 12/15 AM.  Patient is currently on Daptomycin. Culture came back as (+)MRSA, given lack of substantial improvement, ID would like to change abx and keep him here through the weekend. SNF rehab plan for Monday if doing well.   Evening 12/15 elevated temp and cough, ordered CXR and repeat BCx and UA though primary concern is for knee. 12/16 reviewed results - CXR concerning for interstitial edema/pneumonia worse on R, remains w/ fever, SpO2 documented as 70s on RA asked RN team to reassess and confirm if on O2. AM labs show normal WBC, Hgb to 7.6. UA not collected. Asked ID on call (Dr Linus Salmons) to review abx and determine if any changes needed - he confirmed ok to continue w/ current plan. UA did  return w/ large Hgb, no leuks/WBC but showing >50 WBC/HPF on micorscopy - await culture results. Serum WBC normal - not meeting sepsis criteria. Hypoxic on room air improved on . 12/17 CT neg PE, did show bronchial thickening w/ mucoid impaction in lower lobes c/w infectious vs inflammatory bronchitis, mild emphysema, reflux IV contrast in the hepatic veins suggestive of increased right heart pressure. Incentive spirometry and another dose lasix po ordered for today. Remains afebrile and denies SOB    Consultants:  Orthopedic surgery Infectious disease   Procedures: 10/21/22 Arthroscopic irrigation debridement of left knee (Dr [name] - ortho surgery)        ASSESSMENT & PLAN:   Principal Problem:   Septic arthritis of knee, left ([place]) Active Problems:   Chronic systolic CHF (congestive heart failure) ([place])   Uncontrolled type 1 diabetes mellitus with hyperglycemia, with long-term current use of insulin ([name])   DVT (deep venous thrombosis) ([name])   HYPERTENSION, BENIGN   Chronic constipation   Overweight (BMI 25.0-29.9)   Septic arthritis of knee, left ([place]) MRSA Appreciate orthopedic intervention and ID guidance.   Status post repeat arthroscopic debridement 10/21/22.   Continue IV antibiotics per ID --> added ceftaroline to daptomycin 10/25/22    Fever - resolved Concern for bacteremia vs pneumonia vs UTI Continue current abx  Await repeat cultures  --> BCx neg   Acute hypoxic respiratory failure - improved  D/t mild CHF exacerbation, bronchitis  Await BNP and procalcitonin to eval PNA vs CHF --> more likely mild CHF given  elevated BNP, CT findings reflux contrast to hepatic, however also likely bronchitis component  Supplemental O2 as needed Intermittent diuresis  Chronic systolic CHF (congestive heart failure) ([place]) At patient's last hospitalization, 2D echo and TEE confirmed ejection fraction of 35 to 40%.  BNP minimally elevated, has received 1 dose of IV Lasix on  this admission  No rales on exam today or yesterday, slight elevation in BN P with hypoxic respiratory insufficiency Lasix daily   Uncontrolled type 1 diabetes mellitus with hyperglycemia, with long-term current use of insulin ([name]) Resume home medications and sliding scale   DVT (deep venous thrombosis) ([name]) on Eliquis.     HYPERTENSION, BENIGN Stable. Hypotension secondary to anesthesia.   Will resume home medications when blood pressure starts to rise   Chronic constipation Patient has issues with chronic constipation.   He states he has not had a bowel movement since the day he discharged a week prior to this admission.   Will start some Colace and lactulose.   Overweight (BMI 25.0-29.9) Meets criteria BMI greater than 25    DVT prophylaxis: eliquis Pertinent IV fluids/nutrition: no continuous IV fluids Central lines / invasive devices: none  Code Status: FULL CODE  Disposition: inpatient TOC needs: SNF Barriers to discharge / significant pending items: here thru the weekend per ID recs on new IV abx and given fever on Friday, if improving / stable on Monday plan for discharge to SNF             Subjective:  Patient reports no concerns - no subjective fever Denies CP Pain controlled.  Denies new weakness.  Tolerating diet.  Reports no concerns w/ urination/defecation.   Family Communication: None at this time    Objective Findings:  Vitals:   10/26/22 0808 10/26/22 1639 10/26/22 2234 10/27/22 0732  BP:  126/60 120/60 126/63  Pulse:  80 72 70  Resp:  '18 16 16  '$ Temp:  98.5 F (36.9 C) 98.3 F (36.8 C) 98.1 F (36.7 C)  TempSrc:      SpO2: (!) 89% 95% 98% 94%  Weight:      Height:        Intake/Output Summary (Last 24 hours) at 10/27/2022 1339 Last data filed at 10/27/2022 1150 Gross per 24 hour  Intake 566.14 ml  Output 800 ml  Net -233.86 ml   Filed Weights   10/21/22 1454  Weight: 86.2 kg    Examination:  Constitutional:  VS  as above General Appearance: alert, NAD Respiratory: Normal respiratory effort No wheeze No rhonchi No rales Cardiovascular: S1/S2 normal No rub/gallop auscultated Gastrointestinal: No tenderness Musculoskeletal:  Symmetrical movement in all extremities Left leg wrapped, this was not disturbed Neurological: No cranial nerve deficit on limited exam Alert Psychiatric: Normal judgment/insight Normal mood and affect       Scheduled Medications:   apixaban  5 mg Oral BID   aspirin EC  81 mg Oral Daily   atorvastatin  40 mg Oral Daily   carvedilol  6.25 mg Oral BID WC   Chlorhexidine Gluconate Cloth  6 each Topical Daily   docusate sodium  100 mg Oral BID   furosemide  40 mg Oral BID   insulin aspart  0-5 Units Subcutaneous QHS   insulin aspart  0-9 Units Subcutaneous TID WC   insulin glargine-yfgn  28 Units Subcutaneous Daily   lactulose  20 g Oral Daily   multivitamin with minerals  1 tablet Oral Daily   pantoprazole  40 mg Oral Daily  polyethylene glycol  17 g Oral Once   senna-docusate  2 tablet Oral Once   sodium chloride flush  10-40 mL Intracatheter Q12H    Continuous Infusions:  sodium chloride Stopped (10/23/22 2222)   ceFTAROline (TEFLARO) IV 600 mg (10/27/22 1323)   DAPTOmycin (CUBICIN) 700 mg in sodium chloride 0.9 % IVPB 700 mg (10/26/22 2248)   promethazine (PHENERGAN) injection (IM or IVPB)      PRN Medications:  acetaminophen **OR** acetaminophen, diphenhydrAMINE, guaiFENesin-codeine, HYDROmorphone (DILAUDID) injection, hyoscyamine, ondansetron **OR** ondansetron (ZOFRAN) IV, mouth rinse, oxyCODONE, promethazine (PHENERGAN) injection (IM or IVPB), sodium chloride flush, Uribel  Antimicrobials:  Anti-infectives (From admission, onward)    Start     Dose/Rate Route Frequency Ordered Stop   10/25/22 1530  ceftaroline (TEFLARO) 600 mg in sodium chloride 0.9 % 100 mL IVPB        600 mg 100 mL/hr over 60 Minutes Intravenous Every 8 hours 10/25/22  1420     10/21/22 2200  DAPTOmycin (CUBICIN) 700 mg in sodium chloride 0.9 % IVPB        700 mg 128 mL/hr over 30 Minutes Intravenous Every 24 hours 10/21/22 1854     10/21/22 1930  daptomycin (CUBICIN) IVPB  Status:  Discontinued        700 mg Intravenous Every 24 hours 10/21/22 1835 10/21/22 1854   10/21/22 1835  Uribel 118 MG CAPS 118 mg        1 capsule Oral Every 6 hours PRN 10/21/22 1835     10/21/22 1447  ceFAZolin (ANCEF) IVPB 2g/100 mL premix        2 g 200 mL/hr over 30 Minutes Intravenous 60 min pre-op 10/21/22 1445 10/21/22 1557   10/21/22 1447  ceFAZolin (ANCEF) 2-4 GM/100ML-% IVPB       Note to Pharmacy: Norton Blizzard A: cabinet override      10/21/22 1447 10/21/22 1543           Data Reviewed: I have personally reviewed following labs and imaging studies  CBC: Recent Labs  Lab 10/22/22 0515 10/23/22 0530 10/24/22 0620 10/25/22 0600 10/26/22 0530  WBC 10.5 11.9* 11.9* 11.0* 8.5  HGB 8.3* 8.5* 8.0* 8.3* 7.6*  HCT 25.9* 25.9* 24.3* 25.4* 23.7*  MCV 90.9 89.3 88.4 88.2 89.1  PLT 337 356 346 394 408   Basic Metabolic Panel: Recent Labs  Lab 10/22/22 0515 10/23/22 0530 10/24/22 0620 10/25/22 0600 10/26/22 0530  NA 140 140 139 140 140  K 3.7 3.5 3.3* 3.3* 3.6  CL 103 100 101 101 101  CO2 31 33* 33* 33* 34*  GLUCOSE 156* 192* 211* 124* 110*  BUN '23 23 22 18 20  '$ CREATININE 1.11 1.20 1.08 0.95 1.10  CALCIUM 7.7* 7.9* 7.7* 7.9* 7.9*   GFR: Estimated Creatinine Clearance: 63.1 mL/min (by C-G formula based on SCr of 1.1 mg/dL). Liver Function Tests: Recent Labs  Lab 10/21/22 2141 10/22/22 0515  AST 20 18  ALT 18 15  ALKPHOS 113 109  BILITOT 0.5 0.7  PROT 5.8* 5.4*  ALBUMIN 2.0* 1.8*   No results for input(s): "LIPASE", "AMYLASE" in the last 168 hours. No results for input(s): "AMMONIA" in the last 168 hours. Coagulation Profile: No results for input(s): "INR", "PROTIME" in the last 168 hours. Cardiac Enzymes: Recent Labs  Lab  10/22/22 0515  CKTOTAL 23*   BNP (last 3 results) No results for input(s): "PROBNP" in the last 8760 hours. HbA1C: No results for input(s): "HGBA1C" in the last 72 hours. CBG:  Recent Labs  Lab 10/26/22 1121 10/26/22 1624 10/26/22 2053 10/27/22 0735 10/27/22 1113  GLUCAP 188* 243* 179* 201* 182*   Lipid Profile: No results for input(s): "CHOL", "HDL", "LDLCALC", "TRIG", "CHOLHDL", "LDLDIRECT" in the last 72 hours. Thyroid Function Tests: No results for input(s): "TSH", "T4TOTAL", "FREET4", "T3FREE", "THYROIDAB" in the last 72 hours. Anemia Panel: No results for input(s): "VITAMINB12", "FOLATE", "FERRITIN", "TIBC", "IRON", "RETICCTPCT" in the last 72 hours. Most Recent Urinalysis On File:     Component Value Date/Time   COLORURINE AMBER (A) 10/26/2022 1251   APPEARANCEUR CLOUDY (A) 10/26/2022 1251   APPEARANCEUR Clear 06/08/2015 1343   LABSPEC 1.027 10/26/2022 1251   PHURINE 5.0 10/26/2022 1251   GLUCOSEU NEGATIVE 10/26/2022 1251   HGBUR LARGE (A) 10/26/2022 1251   BILIRUBINUR NEGATIVE 10/26/2022 1251   BILIRUBINUR Negative 06/08/2015 1343   KETONESUR NEGATIVE 10/26/2022 1251   PROTEINUR 100 (A) 10/26/2022 1251   NITRITE NEGATIVE 10/26/2022 1251   LEUKOCYTESUR NEGATIVE 10/26/2022 1251   Sepsis Labs: '@LABRCNTIP'$ (procalcitonin:4,lacticidven:4)  Recent Results (from the past 240 hour(s))  Aerobic/Anaerobic Culture w Gram Stain (surgical/deep wound)     Status: None (Preliminary result)   Collection Time: 10/21/22  4:01 PM   Specimen: PATH Cytology Misc. fluid; Body Fluid  Result Value Ref Range Status   Specimen Description   Final    WOUND Performed at [facility], [address], [place], [place] [address]    Special Requests   Final    NONE Performed at [facility], [place], [place] [address]    Gram Stain   Final    FEW WBC PRESENT,BOTH PMN AND MONONUCLEAR RARE GRAM POSITIVE COCCI IN SINGLES    Culture   Final    FEW  METHICILLIN RESISTANT STAPHYLOCOCCUS AUREUS NO ANAEROBES ISOLATED Sent to [place] for further susceptibility testing. Performed at [facility], [place] [address], [place], [place] [address]    Report Status PENDING  Incomplete   Organism ID, Bacteria METHICILLIN RESISTANT STAPHYLOCOCCUS AUREUS  Final      Susceptibility   Methicillin resistant staphylococcus aureus - MIC*    CIPROFLOXACIN >=8 RESISTANT Resistant     ERYTHROMYCIN >=8 RESISTANT Resistant     GENTAMICIN <=0.5 SENSITIVE Sensitive     OXACILLIN RESISTANT Resistant     TETRACYCLINE <=1 SENSITIVE Sensitive     VANCOMYCIN 2 SENSITIVE Sensitive     TRIMETH/SULFA <=10 SENSITIVE Sensitive     CLINDAMYCIN <=0.25 SENSITIVE Sensitive     RIFAMPIN <=0.5 SENSITIVE Sensitive     Inducible Clindamycin NEGATIVE Sensitive     LINEZOLID Value in next row Sensitive      SENSITIVEMIC 2    * FEW METHICILLIN RESISTANT STAPHYLOCOCCUS AUREUS  MRSA Next Gen by PCR, Nasal     Status: Abnormal   Collection Time: 10/22/22 10:50 AM   Specimen: Nasal Mucosa; Nasal Swab  Result Value Ref Range Status   MRSA by PCR Next Gen DETECTED (A) NOT DETECTED Final    Comment: CRITICAL RESULT CALLED TO, READ BACK BY AND VERIFIED WITH: RN Kewon COLLIER AT 1948 10/22/2022 GAA (NOTE) The GeneXpert MRSA Assay (FDA approved for NASAL specimens only), is one component of a comprehensive MRSA colonization surveillance program. It is not intended to diagnose MRSA infection nor to guide or monitor treatment for MRSA infections. Test performance is not FDA approved in patients less than 37 years old. Performed at [facility], [address], [place], [place] [address]   Culture, blood (Routine  X 2) w Reflex to ID Panel     Status: None (Preliminary result)   Collection Time: 10/25/22  8:38 PM   Specimen: BLOOD  Result Value Ref Range Status   Specimen Description BLOOD BLOOD LEFT HAND  Final   Special Requests   Final    BOTTLES DRAWN AEROBIC  AND ANAEROBIC Blood Culture adequate volume   Culture   Final    NO GROWTH 2 DAYS Performed at [facility], [address] [place], [place] [address]    Report Status PENDING  Incomplete  Culture, blood (Routine X 2) w Reflex to ID Panel     Status: None (Preliminary result)   Collection Time: 10/25/22  8:38 PM   Specimen: BLOOD  Result Value Ref Range Status   Specimen Description BLOOD BLOOD RIGHT HAND  Final   Special Requests   Final    IN PEDIATRIC BOTTLE Blood Culture results may not be optimal due to an excessive volume of blood received in culture bottles   Culture   Final    NO GROWTH 2 DAYS Performed at [facility], [address] [place], [place] [address]    Report Status PENDING  Incomplete         Radiology Studies: CT Angio Chest Pulmonary Embolism (PE) W or WO Contrast  Result Date: 10/26/2022 CLINICAL DATA:  Hypoxia, known DVT. EXAM: CT ANGIOGRAPHY CHEST WITH CONTRAST TECHNIQUE: Multidetector CT imaging of the chest was performed using the standard protocol during bolus administration of intravenous contrast. Multiplanar CT image reconstructions and MIPs were obtained to evaluate the vascular anatomy. RADIATION DOSE REDUCTION: This exam was performed according to the departmental dose-optimization program which includes automated exposure control, adjustment of the mA and/or kV according to patient size and/or use of iterative reconstruction technique. CONTRAST:  146m OMNIPAQUE IOHEXOL 350 MG/ML SOLN COMPARISON:  CT chest 04/22/2018 FINDINGS: Cardiovascular: Satisfactory opacification of the pulmonary arteries to the segmental level. No evidence of pulmonary embolism. Normal heart size. No pericardial effusion. LAD coronary artery stent. Mediastinum/Nodes: No enlarged mediastinal, hilar, or axillary lymph nodes. Thyroid gland, trachea, and esophagus demonstrate no significant findings. Lungs/Pleura: Mild mixed centrilobular and paraseptal  emphysema. Diffuse mild bronchial wall thickening with mucoid impaction in the lower lobes. A 0.5 cm subpleural nodule in the left lower lobe and 0.7 cm subpleural nodule at the left lung base are stable compared to 04/22/2018 and therefore considered benign (series 5, image 94, 109). A 0.3 cm subpleural nodule in the right lower lobe is also unchanged compared to 04/22/2018 and also considered benign (series 5, image 80). No pleural effusion or pneumothorax. Upper Abdomen: No acute abnormality. Reflux of intravenous contrast is noted in the hepatic veins. Musculoskeletal: No chest wall abnormality. No acute or significant osseous findings. Review of the MIP images confirms the above findings. IMPRESSION: 1. No pulmonary embolism. 2. Diffuse mild bronchial wall thickening with mucoid impaction in the lower lobes, consistent with infectious versus inflammatory bronchitis. 3. Mild emphysema. 4. Stable benign bilateral pulmonary nodules. No additional dedicated follow-up imaging is indicated. 5. Reflux of intravenous contrast in the hepatic veins, suggestive of increased right heart pressure. Emphysema (ICD10-J43.9). Electronically Signed   By: LIleana RoupM.D.   On: 10/26/2022 18:48   DG Chest Port 1 View  Result Date: 10/25/2022 CLINICAL DATA:  Fever, cough EXAM: PORTABLE CHEST 1 VIEW COMPARISON:  Previous chest radiograph done on 09/11/2007, CT done on 04/22/2018 FINDINGS: Transverse diameter of heart is in the upper limits of  normal. There is possible coronary artery stent. PICC line has been placed through the right upper extremity with tip of the line in superior vena cava. Increased interstitial markings are seen in parahilar regions, more so on the right side. There is no focal consolidation in the peripheral lung fields. There is no pleural effusion or pneumothorax. IMPRESSION: Increased interstitial markings are seen in parahilar regions, more so on the right side suggesting asymmetric interstitial  edema or interstitial pneumonia. There is no pleural effusion or pneumothorax. Electronically Signed   By: Elmer Picker M.D.   On: 10/25/2022 19:47             LOS: 6 days      Emeterio Reeve, DO [facility] 10/27/2022, 1:39 PM    Dictation software may have been used to generate the above note. Typos may occur and escape review in typed/dictated notes. Please contact Dr Sheppard Coil directly for clarity if needed.  Staff may message me via secure chat in [place]  but this may not receive an immediate response,  please page me for urgent matters!  If 7PM-7AM, please contact night coverage [email or web address]      .

## 2022-10-28 DIAGNOSIS — I82452 Acute embolism and thrombosis of left peroneal vein: Secondary | ICD-10-CM | POA: Diagnosis not present

## 2022-10-28 DIAGNOSIS — M009 Pyogenic arthritis, unspecified: Secondary | ICD-10-CM | POA: Diagnosis not present

## 2022-10-28 DIAGNOSIS — E1065 Type 1 diabetes mellitus with hyperglycemia: Secondary | ICD-10-CM | POA: Diagnosis not present

## 2022-10-28 DIAGNOSIS — J101 Influenza due to other identified influenza virus with other respiratory manifestations: Secondary | ICD-10-CM | POA: Diagnosis not present

## 2022-10-28 DIAGNOSIS — M00062 Staphylococcal arthritis, left knee: Secondary | ICD-10-CM | POA: Diagnosis not present

## 2022-10-28 DIAGNOSIS — I5022 Chronic systolic (congestive) heart failure: Secondary | ICD-10-CM | POA: Diagnosis not present

## 2022-10-28 LAB — BASIC METABOLIC PANEL
Anion gap: 5 (ref 5–15)
BUN: 21 mg/dL (ref 8–23)
CO2: 37 mmol/L — ABNORMAL HIGH (ref 22–32)
Calcium: 8.2 mg/dL — ABNORMAL LOW (ref 8.9–10.3)
Chloride: 98 mmol/L (ref 98–111)
Creatinine, Ser: 1.09 mg/dL (ref 0.61–1.24)
GFR, Estimated: >60 mL/min (ref >60)
Glucose, Bld: 114 mg/dL — ABNORMAL HIGH (ref 70–99)
Potassium: 3.5 mmol/L (ref 3.5–5.1)
Sodium: 140 mmol/L (ref 135–145)

## 2022-10-28 LAB — CBC WITH DIFFERENTIAL/PLATELET
Abs Immature Granulocytes: 0.08 10*3/uL — ABNORMAL HIGH (ref 0.00–0.07)
Basophils Absolute: 0.1 10*3/uL (ref 0.0–0.1)
Basophils Relative: 1 %
Eosinophils Absolute: 0.3 10*3/uL (ref 0.0–0.5)
Eosinophils Relative: 4 %
HCT: 27.8 % — ABNORMAL LOW (ref 39.0–52.0)
Hemoglobin: 8.9 g/dL — ABNORMAL LOW (ref 13.0–17.0)
Immature Granulocytes: 1 %
Lymphocytes Relative: 17 %
Lymphs Abs: 1.3 10*3/uL (ref 0.7–4.0)
MCH: 28.7 pg (ref 26.0–34.0)
MCHC: 32 g/dL (ref 30.0–36.0)
MCV: 89.7 fL (ref 80.0–100.0)
Monocytes Absolute: 1.2 10*3/uL — ABNORMAL HIGH (ref 0.1–1.0)
Monocytes Relative: 16 %
Neutro Abs: 4.7 10*3/uL (ref 1.7–7.7)
Neutrophils Relative %: 61 %
Platelets: 384 10*3/uL (ref 150–400)
RBC: 3.1 MIL/uL — ABNORMAL LOW (ref 4.22–5.81)
RDW: 15 % (ref 11.5–15.5)
WBC: 7.5 10*3/uL (ref 4.0–10.5)
nRBC: 0 % (ref 0.0–0.2)

## 2022-10-28 LAB — GLUCOSE, CAPILLARY
Glucose-Capillary: 94 mg/dL (ref 70–99)
Glucose-Capillary: 111 mg/dL — ABNORMAL HIGH (ref 70–99)
Glucose-Capillary: 161 mg/dL — ABNORMAL HIGH (ref 70–99)
Glucose-Capillary: 242 mg/dL — ABNORMAL HIGH (ref 70–99)

## 2022-10-28 LAB — RESP PANEL BY RT-PCR (RSV, FLU A&B, COVID)  RVPGX2
Influenza A by PCR: POSITIVE — AB
Influenza B by PCR: NEGATIVE
Resp Syncytial Virus by PCR: NEGATIVE
SARS Coronavirus 2 by RT PCR: NEGATIVE

## 2022-10-28 LAB — CBC
HCT: 26.9 % — ABNORMAL LOW (ref 39.0–52.0)
Hemoglobin: 8.6 g/dL — ABNORMAL LOW (ref 13.0–17.0)
MCH: 28.6 pg (ref 26.0–34.0)
MCHC: 32 g/dL (ref 30.0–36.0)
MCV: 89.4 fL (ref 80.0–100.0)
Platelets: 379 10*3/uL (ref 150–400)
RBC: 3.01 MIL/uL — ABNORMAL LOW (ref 4.22–5.81)
RDW: 15.1 % (ref 11.5–15.5)
WBC: 8.1 10*3/uL (ref 4.0–10.5)
nRBC: 0 % (ref 0.0–0.2)

## 2022-10-28 LAB — RESPIRATORY PANEL BY PCR

## 2022-10-28 MED ORDER — OSELTAMIVIR PHOSPHATE 75 MG PO CAPS
75.0000 mg | ORAL_CAPSULE | Freq: Two times a day (BID) | ORAL | Status: AC
  Administered 2022-10-28 – 2022-11-01 (×10): 75 mg via ORAL
  Filled 2022-10-28 (×11): qty 1

## 2022-10-28 MED ORDER — FUROSEMIDE 40 MG PO TABS
40.0000 mg | ORAL_TABLET | Freq: Every day | ORAL | Status: DC
  Administered 2022-10-29 – 2022-11-06 (×9): 40 mg via ORAL
  Filled 2022-10-28 (×9): qty 1

## 2022-10-28 MED ORDER — OXYCODONE HCL 5 MG PO TABS: 5.0000 mg | ORAL_TABLET | Freq: Four times a day (QID) | ORAL | 0 refills | Status: DC | PRN

## 2022-10-28 NOTE — Progress Notes (Signed)
  Subjective: 7 Days Post-Op Procedure(s) (LRB): ARTHROSCOPIC INCISION AND DRAINAGE KNEE-LEFT (Left) Patient reports no pain in the operative knee at this time.  Patient is well, and has had no acute complaints or problems Plan is to go Skilled nursing facility after hospital stay. Plan is for discharge today to SNF. Negative for chest pain and shortness of breath Fever: no Gastrointestinal: negative for nausea and vomiting.  Patient has had multiple bowel movements since surgery.  Objective: Vital signs in last 24 hours: Temp:  [98.2 F (36.8 C)] 98.2 F (36.8 C) (12/17 2358) Pulse Rate:  [65-68] 65 (12/17 2358) Resp:  [16] 16 (12/17 2358) BP: (119-125)/(59-61) 119/59 (12/17 2358) SpO2:  [96 %-97 %] 96 % (12/17 2358)  Intake/Output from previous day:  Intake/Output Summary (Last 24 hours) at 10/28/2022 0757 Last data filed at 10/28/2022 0741 Gross per 24 hour  Intake 364 ml  Output 200 ml  Net 164 ml    Intake/Output this shift: Total I/O In: 264 [IV Piggyback:264] Out: -   Labs: Recent Labs    10/26/22 0530 10/28/22 0550  HGB 7.6* 8.6*   Recent Labs    10/26/22 0530 10/28/22 0550  WBC 8.5 8.1  RBC 2.66* 3.01*  HCT 23.7* 26.9*  PLT 341 379   Recent Labs    10/26/22 0530 10/28/22 0550  NA 140 140  K 3.6 3.5  CL 101 98  CO2 34* 37*  BUN 20 21  CREATININE 1.10 1.09  GLUCOSE 110* 114*  CALCIUM 7.9* 8.2*   No results for input(s): "LABPT", "INR" in the last 72 hours.   EXAM General - Patient is Alert, Appropriate, and Oriented Extremity - Neurovascular intact Dorsiflexion/Plantar flexion intact No cellulitis present Compartment soft Dressing/Incision -clean, dry, no drainage, no erythema over knee  ACE wrap intact without drainage, honeycomb dressings applied over the portal sites without any drainage noted. Motor Function - intact, moving foot and toes well on exam. Able to perform independent SLR. Can flex and extend knee without pain.   Abdomen soft with intact bowel sounds this morning.   Assessment/Plan: 7 Days Post-Op Procedure(s) (LRB): ARTHROSCOPIC INCISION AND DRAINAGE KNEE-LEFT (Left) Principal Problem:   Septic arthritis of knee, left ([name]) Active Problems:   HYPERTENSION, BENIGN   DVT (deep venous thrombosis) ([place])   Uncontrolled type 1 diabetes mellitus with hyperglycemia, with long-term current use of insulin ([name])   Chronic systolic CHF (congestive heart failure) ([name])   Overweight (BMI 25.0-29.9)   Chronic constipation  Estimated body mass index is 28.06 kg/m as calculated from the following:   Height as of this encounter: '5\' 9"'$  (1.753 m).   Weight as of this encounter: 86.2 kg. Advance diet Up with therapy  Labs and vitals reviewed this AM.  WBC 8.1 this morning. BP 119/59, does report getting dizzy when going to bathroom yesterday, currently on gentle IVF. Currently on IV Ceftaroline and Daptomycin.  Continue per ID. No erythema noted to the left knee, no signs of active infection at this time. Continue with woundcare for bilateral ankles.  Follow-up with [place] on 11/05/22 for skin check and suture removal.  DVT Prophylaxis - Eliquis and Ted hose Weight-Bearing as tolerated to left leg  J. Cameron Proud, [name] [facility] Orthopa[facility] 10/28/2022, 7:57 AM

## 2022-10-28 NOTE — Progress Notes (Signed)
PROGRESS NOTE    [name]   VFI:[id number] DOB: 08-Aug-1946  DOA: 10/21/2022 Date of Service: 10/28/22 PCP: [name], MD     Brief Narrative / Hospital Course:  Patient is a 76 year old male with past medical history of type 1 diabetes mellitus on insulin pump, history of left leg DVT on Eliquis, CAD status post angioplasty, bladder cancer and previous history of MRSA infection who presented to the emergency room on 11/22 and was admitted to the hospitalist service for recurrent septic arthritis of left knee.    Patient underwent irrigation debridement and partial meniscectomy on 11/23 with repeat irrigation debridement on 11/27 and was placed on IV daptomycin for 6 weeks.  He was discharged on 12/4 to skilled nursing.    Patient was seen in the orthopedic office on the morning of 12/11 for suture removal and noted to have purulent drainage coming from the wound.  Orthopedics  took patient to the OR on 12/11 afternoon for repeat arthroscopic debridement and hospitalists were contacted for admission. Hemovac drain removed 10/23/22, possible need for additional washout on 10/24/22. Cultures showing few staph aureus.  Gram stain with rare gram + cocci in singles - pending susceptibilities as of 12/15 AM.  Patient is currently on Daptomycin. Culture came back as (+)MRSA, given lack of substantial improvement, ID would like to change abx and keep him here through the weekend. SNF rehab plan for Monday if doing well.   Evening 12/15 elevated temp and cough, ordered CXR and repeat BCx and UA though primary concern is for knee. 12/16 reviewed results - CXR concerning for interstitial edema/pneumonia worse on R, remains w/ fever, SpO2 documented as 70s on RA asked RN team to reassess and confirm if on O2. AM labs show normal WBC, Hgb to 7.6. UA not collected. Asked ID on call (Dr Linus Salmons) to review abx and determine if any changes needed - he confirmed ok to continue w/ current plan. UA did  return w/ large Hgb, no leuks/WBC but showing >50 WBC/HPF on micorscopy - await culture results. Serum WBC normal - not meeting sepsis criteria. Hypoxic on room air improved on [place]. 12/17 CT neg PE, did show bronchial thickening w/ mucoid impaction in lower lobes c/w infectious vs inflammatory bronchitis, mild emphysema, reflux IV contrast in the hepatic veins suggestive of increased right heart pressure. Incentive spirometry and another dose lasix po ordered for today but held d/t soft BP. Remains afebrile and denies SOB 12/17-12/18, but tested (+)influenza, started tamiflu. SNF placement delayed also d/t cost of abx, ID following for alternative.     Consultants:  Orthopedic surgery Infectious disease   Procedures: 10/21/22 Arthroscopic irrigation debridement of left knee (Dr [name] - ortho surgery)        ASSESSMENT & PLAN:   Principal Problem:   Septic arthritis of knee, left ([place]) Active Problems:   Chronic systolic CHF (congestive heart failure) ([place])   Uncontrolled type 1 diabetes mellitus with hyperglycemia, with long-term current use of insulin ([name])   DVT (deep venous thrombosis) ([name])   HYPERTENSION, BENIGN   Chronic constipation   Overweight (BMI 25.0-29.9)   Septic arthritis of knee, left ([place]) MRSA Appreciate orthopedic intervention and ID guidance.   Status post repeat arthroscopic debridement 10/21/22.   Continue IV antibiotics per ID --> added ceftaroline to daptomycin 10/25/22    Influenza A Fever - resolved Tamiflu   Acute hypoxic respiratory failure - improved  D/t mild CHF exacerbation, Influenza  Supplemental O2 as needed Intermittent diuresis -  caution w/ soft BP  Chronic systolic CHF (congestive heart failure) ([place]) At patient's last hospitalization, 2D echo and TEE confirmed ejection fraction of 35 to 40%.  Lasix daily - extra dose prn    Uncontrolled type 1 diabetes mellitus with hyperglycemia, with long-term current use of insulin ([name]) Resume  home medications and sliding scale   DVT (deep venous thrombosis) ([name]) on Eliquis.     Essential hypertension w/ soft BP here  Stable Will resume home medications when blood pressure starts to rise   Chronic constipation Will start Colace and lactulose.   Overweight (BMI 25.0-29.9) Meets criteria BMI greater than 25    DVT prophylaxis: eliquis Pertinent IV fluids/nutrition: no continuous IV fluids Central lines / invasive devices: none  Code Status: FULL CODE  Disposition: inpatient TOC needs: SNF Barriers to discharge / significant pending items: awaiting SNF flu protocol              Subjective:  Patient reports no concerns - no subjective fever Denies CP Pain controlled.  Denies new weakness.  Tolerating diet.  Reports no concerns w/ urination/defecation.   Family Communication: None at this time - will call later today     Objective Findings:  Vitals:   10/27/22 2358 10/28/22 0804 10/28/22 1325 10/28/22 1350  BP: (!) 119/59 122/62    Pulse: 65 70    Resp: 16 18    Temp: 98.2 F (36.8 C) 98.5 F (36.9 C)    TempSrc:      SpO2: 96% 90% (!) 86% 97%  Weight:      Height:        Intake/Output Summary (Last 24 hours) at 10/28/2022 1447 Last data filed at 10/28/2022 0741 Gross per 24 hour  Intake 364 ml  Output --  Net 364 ml   Filed Weights   10/21/22 1454  Weight: 86.2 kg    Examination:  Constitutional:  VS as above General Appearance: alert, NAD Respiratory: Normal respiratory effort No wheeze No rhonchi No rales Cardiovascular: S1/S2 normal No rub/gallop auscultated Gastrointestinal: No tenderness Musculoskeletal:  Symmetrical movement in all extremities Left leg wrapped, this was not disturbed Neurological: No cranial nerve deficit on limited exam Alert Psychiatric: Normal judgment/insight Normal mood and affect       Scheduled Medications:   apixaban  5 mg Oral BID   aspirin EC  81 mg Oral Daily    atorvastatin  40 mg Oral Daily   carvedilol  3.125 mg Oral BID WC   Chlorhexidine Gluconate Cloth  6 each Topical Daily   docusate sodium  100 mg Oral BID   [START ON 10/29/2022] furosemide  40 mg Oral Daily   insulin aspart  0-5 Units Subcutaneous QHS   insulin aspart  0-9 Units Subcutaneous TID WC   insulin glargine-yfgn  28 Units Subcutaneous Daily   lactulose  20 g Oral Daily   multivitamin with minerals  1 tablet Oral Daily   oseltamivir  75 mg Oral BID   pantoprazole  40 mg Oral Daily   polyethylene glycol  17 g Oral Once   senna-docusate  2 tablet Oral Once   sodium chloride flush  10-40 mL Intracatheter Q12H    Continuous Infusions:  ceFTAROline (TEFLARO) IV 600 mg (10/28/22 1352)   DAPTOmycin (CUBICIN) 700 mg in sodium chloride 0.9 % IVPB 700 mg (10/27/22 2248)   promethazine (PHENERGAN) injection (IM or IVPB)      PRN Medications:  acetaminophen **OR** acetaminophen, diphenhydrAMINE, guaiFENesin-codeine, HYDROmorphone (DILAUDID) injection, hyoscyamine,  ondansetron **OR** ondansetron (ZOFRAN) IV, mouth rinse, oxyCODONE, promethazine (PHENERGAN) injection (IM or IVPB), sodium chloride flush, Uribel  Antimicrobials:  Anti-infectives (From admission, onward)    Start     Dose/Rate Route Frequency Ordered Stop   10/28/22 1500  oseltamivir (TAMIFLU) capsule 75 mg        75 mg Oral 2 times daily 10/28/22 1402 11/02/22 0959   10/25/22 1530  ceftaroline (TEFLARO) 600 mg in sodium chloride 0.9 % 100 mL IVPB        600 mg 100 mL/hr over 60 Minutes Intravenous Every 8 hours 10/25/22 1420     10/21/22 2200  DAPTOmycin (CUBICIN) 700 mg in sodium chloride 0.9 % IVPB        700 mg 128 mL/hr over 30 Minutes Intravenous Every 24 hours 10/21/22 1854     10/21/22 1930  daptomycin (CUBICIN) IVPB  Status:  Discontinued        700 mg Intravenous Every 24 hours 10/21/22 1835 10/21/22 1854   10/21/22 1835  Uribel 118 MG CAPS 118 mg        1 capsule Oral Every 6 hours PRN 10/21/22 1835      10/21/22 1447  ceFAZolin (ANCEF) IVPB 2g/100 mL premix        2 g 200 mL/hr over 30 Minutes Intravenous 60 min pre-op 10/21/22 1445 10/21/22 1557   10/21/22 1447  ceFAZolin (ANCEF) 2-4 GM/100ML-% IVPB       Note to Pharmacy: Norton Blizzard A: cabinet override      10/21/22 1447 10/21/22 1543           Data Reviewed: I have personally reviewed following labs and imaging studies  CBC: Recent Labs  Lab 10/24/22 0620 10/25/22 0600 10/26/22 0530 10/28/22 0550 10/28/22 1324  WBC 11.9* 11.0* 8.5 8.1 7.5  NEUTROABS  --   --   --   --  4.7  HGB 8.0* 8.3* 7.6* 8.6* 8.9*  HCT 24.3* 25.4* 23.7* 26.9* 27.8*  MCV 88.4 88.2 89.1 89.4 89.7  PLT 346 394 341 379 287   Basic Metabolic Panel: Recent Labs  Lab 10/23/22 0530 10/24/22 0620 10/25/22 0600 10/26/22 0530 10/28/22 0550  NA 140 139 140 140 140  K 3.5 3.3* 3.3* 3.6 3.5  CL 100 101 101 101 98  CO2 33* 33* 33* 34* 37*  GLUCOSE 192* 211* 124* 110* 114*  BUN '23 22 18 20 21  '$ CREATININE 1.20 1.08 0.95 1.10 1.09  CALCIUM 7.9* 7.7* 7.9* 7.9* 8.2*   GFR: Estimated Creatinine Clearance: 63.7 mL/min (by C-G formula based on SCr of 1.09 mg/dL). Liver Function Tests: Recent Labs  Lab 10/21/22 2141 10/22/22 0515  AST 20 18  ALT 18 15  ALKPHOS 113 109  BILITOT 0.5 0.7  PROT 5.8* 5.4*  ALBUMIN 2.0* 1.8*   No results for input(s): "LIPASE", "AMYLASE" in the last 168 hours. No results for input(s): "AMMONIA" in the last 168 hours. Coagulation Profile: No results for input(s): "INR", "PROTIME" in the last 168 hours. Cardiac Enzymes: Recent Labs  Lab 10/22/22 0515  CKTOTAL 23*   BNP (last 3 results) No results for input(s): "PROBNP" in the last 8760 hours. HbA1C: No results for input(s): "HGBA1C" in the last 72 hours. CBG: Recent Labs  Lab 10/27/22 1113 10/27/22 1620 10/27/22 2046 10/28/22 0808 10/28/22 1154  GLUCAP 182* 155* 118* 94 111*   Lipid Profile: No results for input(s): "CHOL", "HDL", "LDLCALC",  "TRIG", "CHOLHDL", "LDLDIRECT" in the last 72 hours. Thyroid Function Tests: No  results for input(s): "TSH", "T4TOTAL", "FREET4", "T3FREE", "THYROIDAB" in the last 72 hours. Anemia Panel: No results for input(s): "VITAMINB12", "FOLATE", "FERRITIN", "TIBC", "IRON", "RETICCTPCT" in the last 72 hours. Most Recent Urinalysis On File:     Component Value Date/Time   COLORURINE AMBER (A) 10/26/2022 1251   APPEARANCEUR CLOUDY (A) 10/26/2022 1251   APPEARANCEUR Clear 06/08/2015 1343   LABSPEC 1.027 10/26/2022 1251   PHURINE 5.0 10/26/2022 1251   GLUCOSEU NEGATIVE 10/26/2022 1251   HGBUR LARGE (A) 10/26/2022 1251   BILIRUBINUR NEGATIVE 10/26/2022 1251   BILIRUBINUR Negative 06/08/2015 1343   KETONESUR NEGATIVE 10/26/2022 1251   PROTEINUR 100 (A) 10/26/2022 1251   NITRITE NEGATIVE 10/26/2022 1251   LEUKOCYTESUR NEGATIVE 10/26/2022 1251   Sepsis Labs: '@LABRCNTIP'$ (procalcitonin:4,lacticidven:4)  Recent Results (from the past 240 hour(s))  Aerobic/Anaerobic Culture w Gram Stain (surgical/deep wound)     Status: None (Preliminary result)   Collection Time: 10/21/22  4:01 PM   Specimen: PATH Cytology Misc. fluid; Body Fluid  Result Value Ref Range Status   Specimen Description   Final    WOUND Performed at [facility], [address], [place], [place] [address]    Special Requests   Final    NONE Performed at [facility], [place], [place] [address]    Gram Stain   Final    FEW WBC PRESENT,BOTH PMN AND MONONUCLEAR RARE GRAM POSITIVE COCCI IN SINGLES    Culture   Final    FEW METHICILLIN RESISTANT STAPHYLOCOCCUS AUREUS NO ANAEROBES ISOLATED Sent to [facility] for further susceptibility testing. Performed at [facility], [place] [address] [place], [place] [address]    Report Status PENDING  Incomplete   Organism ID, Bacteria METHICILLIN RESISTANT STAPHYLOCOCCUS AUREUS  Final      Susceptibility   Methicillin resistant staphylococcus aureus -  MIC*    CIPROFLOXACIN >=8 RESISTANT Resistant     ERYTHROMYCIN >=8 RESISTANT Resistant     GENTAMICIN <=0.5 SENSITIVE Sensitive     OXACILLIN RESISTANT Resistant     TETRACYCLINE <=1 SENSITIVE Sensitive     VANCOMYCIN 2 SENSITIVE Sensitive     TRIMETH/SULFA <=10 SENSITIVE Sensitive     CLINDAMYCIN <=0.25 SENSITIVE Sensitive     RIFAMPIN <=0.5 SENSITIVE Sensitive     Inducible Clindamycin NEGATIVE Sensitive     LINEZOLID Value in next row Sensitive      SENSITIVEMIC 2    * FEW METHICILLIN RESISTANT STAPHYLOCOCCUS AUREUS  MRSA Next Gen by PCR, Nasal     Status: Abnormal   Collection Time: 10/22/22 10:50 AM   Specimen: Nasal Mucosa; Nasal Swab  Result Value Ref Range Status   MRSA by PCR Next Gen DETECTED (A) NOT DETECTED Final    Comment: CRITICAL RESULT CALLED TO, READ BACK BY AND VERIFIED WITH: RN Deaundre COLLIER AT 1948 10/22/2022 GAA (NOTE) The GeneXpert MRSA Assay (FDA approved for NASAL specimens only), is one component of a comprehensive MRSA colonization surveillance program. It is not intended to diagnose MRSA infection nor to guide or monitor treatment for MRSA infections. Test performance is not FDA approved in patients less than 68 years old. Performed at [facility], [address] [place], [place] [address]   Culture, blood (Routine X 2) w Reflex to ID Panel     Status: None (Preliminary result)   Collection Time: 10/25/22  8:38 PM   Specimen: BLOOD  Result Value Ref Range Status   Specimen Description BLOOD BLOOD LEFT HAND  Final  Special Requests   Final    BOTTLES DRAWN AEROBIC AND ANAEROBIC Blood Culture adequate volume   Culture   Final    NO GROWTH 3 DAYS Performed at [facility], [address] [place], [place] [address]    Report Status PENDING  Incomplete  Culture, blood (Routine X 2) w Reflex to ID Panel     Status: None (Preliminary result)   Collection Time: 10/25/22  8:38 PM   Specimen: BLOOD  Result Value Ref Range Status    Specimen Description BLOOD BLOOD RIGHT HAND  Final   Special Requests   Final    IN PEDIATRIC BOTTLE Blood Culture results may not be optimal due to an excessive volume of blood received in culture bottles   Culture   Final    NO GROWTH 3 DAYS Performed at [facility], [address], [place], [place] [address]    Report Status PENDING  Incomplete  Urine Culture     Status: None   Collection Time: 10/26/22 12:51 PM   Specimen: Urine, Random  Result Value Ref Range Status   Specimen Description   Final    URINE, RANDOM Performed at [facility], [address], [place], [place] [address]    Special Requests   Final    NONE Performed at [facility], [address] [place], [place] [address]    Culture   Final    NO GROWTH Performed at [facility], [place] [address] [place], [place] [address]    Report Status 10/27/2022 FINAL  Final  Resp panel by RT-PCR (RSV, Flu A&B, Covid) Anterior Nasal Swab     Status: Abnormal   Collection Time: 10/28/22 11:35 AM   Specimen: Anterior Nasal Swab  Result Value Ref Range Status   SARS Coronavirus 2 by RT PCR NEGATIVE NEGATIVE Final    Comment: (NOTE) SARS-CoV-2 target nucleic acids are NOT DETECTED.  The SARS-CoV-2 RNA is generally detectable in upper respiratory specimens during the acute phase of infection. The lowest concentration of SARS-CoV-2 viral copies this assay can detect is 138 copies/mL. A negative result does not preclude SARS-Cov-2 infection and should not be used as the sole basis for treatment or other patient management decisions. A negative result may occur with  improper specimen collection/handling, submission of specimen other than nasopharyngeal swab, presence of viral mutation(s) within the areas targeted by this assay, and inadequate number of viral copies(<138 copies/mL). A negative result must be combined with clinical observations, patient history, and  epidemiological information. The expected result is Negative.  Fact Sheet for Patients:  EntrepreneurPulse.com.au  Fact Sheet for Healthcare Providers:  IncredibleEmployment.be  This test is no t yet approved or cleared by the [place] FDA and  has been authorized for detection and/or diagnosis of SARS-CoV-2 by FDA under an Emergency Use Authorization (EUA). This EUA will remain  in effect (meaning this test can be used) for the duration of the COVID-19 declaration under Section 564(b)(1) of the Act, 21 U.S.C.section 360bbb-3(b)(1), unless the authorization is terminated  or revoked sooner.       Influenza A by PCR POSITIVE (A) NEGATIVE Final   Influenza B by PCR NEGATIVE NEGATIVE Final    Comment: (NOTE) The Xpert Xpress SARS-CoV-2/FLU/RSV plus assay is intended as an aid in the diagnosis of influenza from Nasopharyngeal swab specimens and should not be used as a sole basis for treatment. Nasal washings and aspirates are unacceptable for Xpert Xpress SARS-CoV-2/FLU/RSV testing.  Fact  Sheet for Patients: EntrepreneurPulse.com.au  Fact Sheet for Healthcare Providers: IncredibleEmployment.be  This test is not yet approved or cleared by the [place] FDA and has been authorized for detection and/or diagnosis of SARS-CoV-2 by FDA under an Emergency Use Authorization (EUA). This EUA will remain in effect (meaning this test can be used) for the duration of the COVID-19 declaration under Section 564(b)(1) of the Act, 21 U.S.C. section 360bbb-3(b)(1), unless the authorization is terminated or revoked.     Resp Syncytial Virus by PCR NEGATIVE NEGATIVE Final    Comment: (NOTE) Fact Sheet for Patients: EntrepreneurPulse.com.au  Fact Sheet for Healthcare Providers: IncredibleEmployment.be  This test is not yet approved or cleared by the [place] FDA and has  been authorized for detection and/or diagnosis of SARS-CoV-2 by FDA under an Emergency Use Authorization (EUA). This EUA will remain in effect (meaning this test can be used) for the duration of the COVID-19 declaration under Section 564(b)(1) of the Act, 21 U.S.C. section 360bbb-3(b)(1), unless the authorization is terminated or revoked.  Performed at [facility], [address] [place], [place] [address]          Radiology Studies: CT Angio Chest Pulmonary Embolism (PE) W or WO Contrast  Result Date: 10/26/2022 CLINICAL DATA:  Hypoxia, known DVT. EXAM: CT ANGIOGRAPHY CHEST WITH CONTRAST TECHNIQUE: Multidetector CT imaging of the chest was performed using the standard protocol during bolus administration of intravenous contrast. Multiplanar CT image reconstructions and MIPs were obtained to evaluate the vascular anatomy. RADIATION DOSE REDUCTION: This exam was performed according to the departmental dose-optimization program which includes automated exposure control, adjustment of the mA and/or kV according to patient size and/or use of iterative reconstruction technique. CONTRAST:  140m OMNIPAQUE IOHEXOL 350 MG/ML SOLN COMPARISON:  CT chest 04/22/2018 FINDINGS: Cardiovascular: Satisfactory opacification of the pulmonary arteries to the segmental level. No evidence of pulmonary embolism. Normal heart size. No pericardial effusion. LAD coronary artery stent. Mediastinum/Nodes: No enlarged mediastinal, hilar, or axillary lymph nodes. Thyroid gland, trachea, and esophagus demonstrate no significant findings. Lungs/Pleura: Mild mixed centrilobular and paraseptal emphysema. Diffuse mild bronchial wall thickening with mucoid impaction in the lower lobes. A 0.5 cm subpleural nodule in the left lower lobe and 0.7 cm subpleural nodule at the left lung base are stable compared to 04/22/2018 and therefore considered benign (series 5, image 94, 109). A 0.3 cm subpleural nodule in the right  lower lobe is also unchanged compared to 04/22/2018 and also considered benign (series 5, image 80). No pleural effusion or pneumothorax. Upper Abdomen: No acute abnormality. Reflux of intravenous contrast is noted in the hepatic veins. Musculoskeletal: No chest wall abnormality. No acute or significant osseous findings. Review of the MIP images confirms the above findings. IMPRESSION: 1. No pulmonary embolism. 2. Diffuse mild bronchial wall thickening with mucoid impaction in the lower lobes, consistent with infectious versus inflammatory bronchitis. 3. Mild emphysema. 4. Stable benign bilateral pulmonary nodules. No additional dedicated follow-up imaging is indicated. 5. Reflux of intravenous contrast in the hepatic veins, suggestive of increased right heart pressure. Emphysema (ICD10-J43.9). Electronically Signed   By: LIleana RoupM.D.   On: 10/26/2022 18:48   DG Chest Port 1 View  Result Date: 10/25/2022 CLINICAL DATA:  Fever, cough EXAM: PORTABLE CHEST 1 VIEW COMPARISON:  Previous chest radiograph done on 09/11/2007, CT done on 04/22/2018 FINDINGS: Transverse diameter of heart is in the upper limits of normal. There is possible coronary artery stent. PICC line has been placed through the right upper  extremity with tip of the line in superior vena cava. Increased interstitial markings are seen in parahilar regions, more so on the right side. There is no focal consolidation in the peripheral lung fields. There is no pleural effusion or pneumothorax. IMPRESSION: Increased interstitial markings are seen in parahilar regions, more so on the right side suggesting asymmetric interstitial edema or interstitial pneumonia. There is no pleural effusion or pneumothorax. Electronically Signed   By: Elmer Picker M.D.   On: 10/25/2022 19:47             LOS: 7 days      Emeterio Reeve, DO [facility] 10/28/2022, 2:47 PM    Dictation software may have been used to generate the above  note. Typos may occur and escape review in typed/dictated notes. Please contact Dr Sheppard Coil directly for clarity if needed.  Staff may message me via secure chat in [place]  but this may not receive an immediate response,  please page me for urgent matters!  If 7PM-7AM, please contact night coverage [email or web address]      .

## 2022-10-28 NOTE — Anesthesia Postprocedure Evaluation (Signed)
Anesthesia Post Note  Patient: [name]  Procedure(s) Performed: ARTHROSCOPIC INCISION AND DRAINAGE KNEE-LEFT (Left: Knee)  Patient location during evaluation: PACU Anesthesia Type: General Level of consciousness: awake and alert Pain management: pain level controlled Vital Signs Assessment: post-procedure vital signs reviewed and stable Respiratory status: spontaneous breathing, nonlabored ventilation, respiratory function stable and patient connected to nasal cannula oxygen Cardiovascular status: blood pressure returned to baseline and stable Postop Assessment: no apparent nausea or vomiting Anesthetic complications: no   No notable events documented.   Last Vitals:  Vitals:   10/27/22 2358 10/28/22 0804  BP: (!) 119/59 122/62  Pulse: 65 70  Resp: 16 18  Temp: 36.8 C 36.9 C  SpO2: 96% 90%    Last Pain:  Vitals:   10/28/22 0939  TempSrc:   PainSc: 1                  Martha Clan

## 2022-10-28 NOTE — Plan of Care (Signed)

## 2022-10-28 NOTE — Progress Notes (Signed)
PT Cancellation Note  Patient Details Name: [name] MRN: [id number] DOB: 1946-10-06   Cancelled Treatment:    Reason Eval/Treat Not Completed: Fatigue/lethargy limiting ability to participate  Pt declined earlier today stating he was orthostatic with nursing and had to get back to bed.  Returned after lunch and while BP at rest is 107/61 he continues to feel fatigued and not well.  Recent covid swab and awaiting results.  Will return tomorrow per his request.   Chesley Noon 10/28/2022, 1:29 PM

## 2022-10-28 NOTE — Care Management Important Message (Signed)
Important Message  Patient Details  Name: [name] MRN: [id number] Date of Birth: 03-16-1946   Medicare Important Message Given:  Yes  Patient is in an isolation room so I reviewed his Important Message from Medicare with him by phone [phone]). He stated he understood his rights and I wished him a speedy recovery.   Juliann Pulse A Lezley Bedgood 10/28/2022, 11:31 AM

## 2022-10-28 NOTE — Progress Notes (Signed)
Date of Admission:  10/21/2022      ID: [name] is a 76 y.o. male Principal Problem:   Septic arthritis of knee, left ([place]) Active Problems:   HYPERTENSION, BENIGN   DVT (deep venous thrombosis) ([place])   Uncontrolled type 1 diabetes mellitus with hyperglycemia, with long-term current use of insulin ([name])   Chronic systolic CHF (congestive heart failure) ([name])   Overweight (BMI 25.0-29.9)   Chronic constipation    Subjective: Pt had fever on Friday Some cough Left knee pain improving  Medications:   apixaban  5 mg Oral BID   aspirin EC  81 mg Oral Daily   atorvastatin  40 mg Oral Daily   carvedilol  3.125 mg Oral BID WC   Chlorhexidine Gluconate Cloth  6 each Topical Daily   docusate sodium  100 mg Oral BID   furosemide  40 mg Oral BID   insulin aspart  0-5 Units Subcutaneous QHS   insulin aspart  0-9 Units Subcutaneous TID WC   insulin glargine-yfgn  28 Units Subcutaneous Daily   lactulose  20 g Oral Daily   multivitamin with minerals  1 tablet Oral Daily   pantoprazole  40 mg Oral Daily   polyethylene glycol  17 g Oral Once   senna-docusate  2 tablet Oral Once   sodium chloride flush  10-40 mL Intracatheter Q12H    Objective: Vital signs in last 24 hours: Temp:  [98.2 F (36.8 C)-98.5 F (36.9 C)] 98.5 F (36.9 C) (12/18 0804) Pulse Rate:  [65-70] 70 (12/18 0804) Resp:  [16-18] 18 (12/18 0804) BP: (119-125)/(59-62) 122/62 (12/18 0804) SpO2:  [90 %-97 %] 90 % (12/18 0804)  LDA Rt PICC  PHYSICAL EXAM:  General: Alert, cooperative, no distress, a Neck: Supple, symmetrical, no adenopathy, thyroid: non tender no carotid bruit and no JVD. Back: No CVA tenderness. Lungs: Clear to auscultation bilaterally. No Wheezing or Rhonchi. No rales. Heart: Regular rate and rhythm, no murmur, rub or gallop. Abdomen: Soft, non-tender,not distended. Bowel sounds normal. No masses Extremities: left knee surgical site covered with dressing- minimal  swelling Skin: No rashes or lesions. Or bruising Lymph: Cervical, supraclavicular normal. Neurologic: Grossly non-focal  Lab Results Recent Labs    10/26/22 0530 10/28/22 0550  WBC 8.5 8.1  HGB 7.6* 8.6*  HCT 23.7* 26.9*  NA 140 140  K 3.6 3.5  CL 101 98  CO2 34* 37*  BUN 20 21  CREATININE 1.10 1.09    Microbiology:  10/03/22 2 sets blood culture MRSA 10/03/22 - left knee - MRSA 10/05/22 BC X 2 - NG 10/07/22 Synovial fluid MRSA 10/21/22 Synovium MRSA Vanco MIC 2 Studies/Results: CT Angio Chest Pulmonary Embolism (PE) W or WO Contrast  Result Date: 10/26/2022 CLINICAL DATA:  Hypoxia, known DVT. EXAM: CT ANGIOGRAPHY CHEST WITH CONTRAST TECHNIQUE: Multidetector CT imaging of the chest was performed using the standard protocol during bolus administration of intravenous contrast. Multiplanar CT image reconstructions and MIPs were obtained to evaluate the vascular anatomy. RADIATION DOSE REDUCTION: This exam was performed according to the departmental dose-optimization program which includes automated exposure control, adjustment of the mA and/or kV according to patient size and/or use of iterative reconstruction technique. CONTRAST:  138m OMNIPAQUE IOHEXOL 350 MG/ML SOLN COMPARISON:  CT chest 04/22/2018 FINDINGS: Cardiovascular: Satisfactory opacification of the pulmonary arteries to the segmental level. No evidence of pulmonary embolism. Normal heart size. No pericardial effusion. LAD coronary artery stent. Mediastinum/Nodes: No enlarged mediastinal, hilar, or axillary lymph nodes. Thyroid gland,  trachea, and esophagus demonstrate no significant findings. Lungs/Pleura: Mild mixed centrilobular and paraseptal emphysema. Diffuse mild bronchial wall thickening with mucoid impaction in the lower lobes. A 0.5 cm subpleural nodule in the left lower lobe and 0.7 cm subpleural nodule at the left lung base are stable compared to 04/22/2018 and therefore considered benign (series 5, image 94,  109). A 0.3 cm subpleural nodule in the right lower lobe is also unchanged compared to 04/22/2018 and also considered benign (series 5, image 80). No pleural effusion or pneumothorax. Upper Abdomen: No acute abnormality. Reflux of intravenous contrast is noted in the hepatic veins. Musculoskeletal: No chest wall abnormality. No acute or significant osseous findings. Review of the MIP images confirms the above findings. IMPRESSION: 1. No pulmonary embolism. 2. Diffuse mild bronchial wall thickening with mucoid impaction in the lower lobes, consistent with infectious versus inflammatory bronchitis. 3. Mild emphysema. 4. Stable benign bilateral pulmonary nodules. No additional dedicated follow-up imaging is indicated. 5. Reflux of intravenous contrast in the hepatic veins, suggestive of increased right heart pressure. Emphysema (ICD10-J43.9). Electronically Signed   By: Ileana Roup M.D.   On: 10/26/2022 18:48     Assessment/Plan:  MRSA bacteremia 10/02/22 Repeat BC 112/5/23 NG TEE 10/07/22 No endocarditis Has been on daptomycin after initial vanco   Left knee septic arthritis due to MRSA 11/22 washout MRSA 10/07/22 washout MRSA 10/21/22 washout MRSA-- the vancomycin MIC has increased to 2 from 1 before  daptomycin MIC has been sent [facility] Also on Ceftaroline  as dual MRSA coverage  Unable to use rifampin instead because he is on eliquis HE is going to need 6 weeks of IV antibiotic from his last washout - 12/01/22 Final antibiotic will be decided depending on the MIC of dapto  Influenza A resp illness - tested positive today- will start Tamiflu   DVT- Non occlusive clot left peroneal vein on 11/22- on eliquis   Anemia  ? H/o complicate dUTI due to MRSA in May 2023 HE has urethral stricture and gets frequent dilatation Also has bladder diverticulum This may put him ar risk for MRSA colonization of urine   H/o ca bladder  Discussed the management with patient and care team

## 2022-10-29 DIAGNOSIS — R7881 Bacteremia: Secondary | ICD-10-CM | POA: Diagnosis not present

## 2022-10-29 DIAGNOSIS — I82452 Acute embolism and thrombosis of left peroneal vein: Secondary | ICD-10-CM | POA: Diagnosis not present

## 2022-10-29 DIAGNOSIS — M00062 Staphylococcal arthritis, left knee: Secondary | ICD-10-CM | POA: Diagnosis not present

## 2022-10-29 DIAGNOSIS — B9562 Methicillin resistant Staphylococcus aureus infection as the cause of diseases classified elsewhere: Secondary | ICD-10-CM | POA: Diagnosis not present

## 2022-10-29 DIAGNOSIS — E1065 Type 1 diabetes mellitus with hyperglycemia: Secondary | ICD-10-CM | POA: Diagnosis not present

## 2022-10-29 DIAGNOSIS — J101 Influenza due to other identified influenza virus with other respiratory manifestations: Secondary | ICD-10-CM | POA: Diagnosis not present

## 2022-10-29 DIAGNOSIS — M009 Pyogenic arthritis, unspecified: Secondary | ICD-10-CM | POA: Diagnosis not present

## 2022-10-29 DIAGNOSIS — I5022 Chronic systolic (congestive) heart failure: Secondary | ICD-10-CM | POA: Diagnosis not present

## 2022-10-29 LAB — CBC
HCT: 28.7 % — ABNORMAL LOW (ref 39.0–52.0)
Hemoglobin: 9.2 g/dL — ABNORMAL LOW (ref 13.0–17.0)
MCH: 28.5 pg (ref 26.0–34.0)
MCHC: 32.1 g/dL (ref 30.0–36.0)
MCV: 88.9 fL (ref 80.0–100.0)
Platelets: 404 10*3/uL — ABNORMAL HIGH (ref 150–400)
RBC: 3.23 MIL/uL — ABNORMAL LOW (ref 4.22–5.81)
RDW: 15 % (ref 11.5–15.5)
WBC: 8.3 10*3/uL (ref 4.0–10.5)
nRBC: 0 % (ref 0.0–0.2)

## 2022-10-29 LAB — GLUCOSE, CAPILLARY
Glucose-Capillary: 270 mg/dL — ABNORMAL HIGH (ref 70–99)
Glucose-Capillary: 205 mg/dL — ABNORMAL HIGH (ref 70–99)
Glucose-Capillary: 148 mg/dL — ABNORMAL HIGH (ref 70–99)
Glucose-Capillary: 152 mg/dL — ABNORMAL HIGH (ref 70–99)

## 2022-10-29 LAB — CK: Total CK: 21 U/L — ABNORMAL LOW (ref 49–397)

## 2022-10-29 NOTE — Progress Notes (Signed)
Occupational Therapy Treatment Patient Details Name: [name] MRN: [id number] DOB: February 17, 1946 Today's Date: 10/29/2022   History of present illness Patient here septic left knee, s/p I & D. WBAT. Pt. has had multiple recent similar admissions.   OT comments  Upon entering session, pt resting in bed and agreeable to OT. Pt completed bed mobility with Min A, stood from EOB with Min-Mod A, and completed step pivot transfer from EOB<>BSC with CGA-Min A using RW. Once sitting EOB, pt found to have incontinent BM and requested to attempt to use BSC. Ultimately unable to void on BSC. He experienced urinary incontinence upon standing. Pt then required Mod A for LB dressing and Max A for posterior hygiene. Pt reporting dizziness throughout and was returned to supine with assistance provided for proper safe positioning. Pt left as received with all needs in reach. Pt is making progress toward goal completion. D/C recommendation remains appropriate. OT will continue to follow acutely.   Orthostatic vitals: Supine 124/73 (MAP 82), pulse 64 Sitting 92/62 (MAP 71), pulse 74 Pt unable to tolerate standing long enough to get last reading.   Recommendations for follow up therapy are one component of a multi-disciplinary discharge planning process, led by the attending physician.  Recommendations may be updated based on patient status, additional functional criteria and insurance authorization.    Follow Up Recommendations  Skilled nursing-short term rehab (<3 hours/day)     Assistance Recommended at Discharge Frequent or constant Supervision/Assistance  Patient can return home with the following  A lot of help with bathing/dressing/bathroom;A little help with walking and/or transfers;Assistance with cooking/housework;Help with stairs or ramp for entrance;Assist for transportation;Direct supervision/assist for financial management;Direct supervision/assist for medications management   Equipment  Recommendations  Other (comment) (defer to next venue of care)    Recommendations for Other Services      Precautions / Restrictions Precautions Precautions: Fall Precaution Comments: orthostatic Restrictions Weight Bearing Restrictions: Yes LLE Weight Bearing: Weight bearing as tolerated       Mobility Bed Mobility Overal bed mobility: Needs Assistance Bed Mobility: Supine to Sit, Sit to Supine     Supine to sit: Min assist (for trunk elevation) Sit to supine: Supervision        Transfers Overall transfer level: Needs assistance Equipment used: Rolling walker (2 wheels) Transfers: Sit to/from Stand, Bed to chair/wheelchair/BSC Sit to Stand: Min assist, Mod assist     Step pivot transfers: Min guard, Min assist           Balance Overall balance assessment: Needs assistance Sitting-balance support: Feet supported Sitting balance-Leahy Scale: Fair     Standing balance support: Bilateral upper extremity supported, During functional activity Standing balance-Leahy Scale: Fair                             ADL either performed or assessed with clinical judgement   ADL Overall ADL's : Needs assistance/impaired                     Lower Body Dressing: Sitting/lateral leans;Moderate assistance Lower Body Dressing Details (indicate cue type and reason): assist to doff socks after becoming saturated with urine Toilet Transfer: Min guard;Rolling walker (2 wheels);BSC/3in1;Ambulation;Minimal assistance Toilet Transfer Details (indicate cue type and reason): short ambulatory transfer Toileting- Clothing Manipulation and Hygiene: Maximal assistance;Sit to/from stand Toileting - Clothing Manipulation Details (indicate cue type and reason): assist for posterior hygiene after incontinent BM in bed, unable to void  once on [facility]            Extremity/Trunk Assessment Upper Extremity Assessment Upper Extremity Assessment: Generalized weakness   Lower  Extremity Assessment Lower Extremity Assessment: Generalized weakness   Cervical / Trunk Assessment Cervical / Trunk Assessment: Normal    Vision Patient Visual Report: No change from baseline     Perception     Praxis      Cognition Arousal/Alertness: Awake/alert Behavior During Therapy: Flat affect Overall Cognitive Status: Within Functional Limits for tasks assessed                                          Exercises      Shoulder Instructions       General Comments      Pertinent Vitals/ Pain       Pain Assessment Pain Assessment: Faces Faces Pain Scale: Hurts a little bit Pain Location: L knee Pain Descriptors / Indicators: Discomfort, Sore Pain Intervention(s): Limited activity within patient's tolerance, Monitored during session, Repositioned  Home Living                                          Prior Functioning/Environment              Frequency  Min 2X/week        Progress Toward Goals  OT Goals(current goals can now be found in the care plan section)  Progress towards OT goals: Progressing toward goals  Acute Rehab OT Goals Patient Stated Goal: to get stronger and return to rehab OT Goal Formulation: With patient Time For Goal Achievement: 11/06/22 Potential to Achieve Goals: Good  Plan Discharge plan remains appropriate;Frequency remains appropriate    Co-evaluation                 AM-PAC OT "6 Clicks" Daily Activity     Outcome Measure   Help from another person eating meals?: None Help from another person taking care of personal grooming?: None Help from another person toileting, which includes using toliet, bedpan, or urinal?: A Lot Help from another person bathing (including washing, rinsing, drying)?: A Lot Help from another person to put on and taking off regular upper body clothing?: A Little Help from another person to put on and taking off regular lower body clothing?: A Lot 6  Click Score: 17    End of Session Equipment Utilized During Treatment: Rolling walker (2 wheels);Gait belt  OT Visit Diagnosis: Unsteadiness on feet (R26.81);Repeated falls (R29.6);Muscle weakness (generalized) (M62.81);Pain Pain - Right/Left: Left Pain - part of body: Knee   Activity Tolerance Patient tolerated treatment well;Other (comment) (limited by dizziness/orthostatics)   Patient Left in bed;with call bell/phone within reach;with bed alarm set   Nurse Communication Mobility status        Time: 3704-8889 OT Time Calculation (min): 27 min  Charges: OT General Charges $OT Visit: 1 Visit OT Treatments $Self Care/Home Management : 23-37 mins  [facility] MS, OTR/L ascom [phone]  10/29/22, 6:38 PM

## 2022-10-29 NOTE — Plan of Care (Signed)

## 2022-10-29 NOTE — Progress Notes (Signed)
Date of Admission:  10/21/2022      ID: [name] is a 76 y.o. male Principal Problem:   Septic arthritis of knee, left ([place]) Active Problems:   HYPERTENSION, BENIGN   DVT (deep venous thrombosis) ([place])   Uncontrolled type 1 diabetes mellitus with hyperglycemia, with long-term current use of insulin ([name])   Chronic systolic CHF (congestive heart failure) ([name])   Overweight (BMI 25.0-29.9)   Chronic constipation   Influenza A    Subjective: Feeling better  No fever Cough better Left knee pain better But he was dizzy when he tried to work with PT  Medications:   apixaban  5 mg Oral BID   aspirin EC  81 mg Oral Daily   atorvastatin  40 mg Oral Daily   carvedilol  3.125 mg Oral BID WC   Chlorhexidine Gluconate Cloth  6 each Topical Daily   docusate sodium  100 mg Oral BID   furosemide  40 mg Oral Daily   insulin aspart  0-5 Units Subcutaneous QHS   insulin aspart  0-9 Units Subcutaneous TID WC   insulin glargine-yfgn  28 Units Subcutaneous Daily   lactulose  20 g Oral Daily   multivitamin with minerals  1 tablet Oral Daily   oseltamivir  75 mg Oral BID   pantoprazole  40 mg Oral Daily   polyethylene glycol  17 g Oral Once   senna-docusate  2 tablet Oral Once   sodium chloride flush  10-40 mL Intracatheter Q12H    Objective: Vital signs in last 24 hours: Temp:  [98.1 F (36.7 C)] 98.1 F (36.7 C) (12/19 0819) Pulse Rate:  [66-70] 67 (12/19 0819) Resp:  [15-18] 18 (12/19 0819) BP: (115-139)/(55-67) 139/62 (12/19 0819) SpO2:  [93 %-96 %] 96 % (12/19 0819)  LDA Rt PICC  PHYSICAL EXAM:  General: Alert, cooperative, no distress at rest Neck: Supple, symmetrical, no adenopathy, thyroid: non tender no carotid bruit and no JVD. Back: No CVA tenderness. Lungs: Clear to auscultation bilaterally. No Wheezing or Rhonchi. No rales. Heart: Regular rate and rhythm, no murmur, rub or gallop. Abdomen: Soft, non-tender,not distended. Bowel sounds normal. No  masses Extremities: left knee surgical site covered with dressing- minimal swelling Skin: No rashes or lesions. Or bruising Lymph: Cervical, supraclavicular normal. Neurologic: Grossly non-focal  Lab Results Recent Labs    10/28/22 0550 10/28/22 1324 10/29/22 0339  WBC 8.1 7.5 8.3  HGB 8.6* 8.9* 9.2*  HCT 26.9* 27.8* 28.7*  NA 140  --   --   K 3.5  --   --   CL 98  --   --   CO2 37*  --   --   BUN 21  --   --   CREATININE 1.09  --   --     Microbiology:  10/03/22 2 sets blood culture MRSA 10/03/22 - left knee - MRSA 10/05/22 BC X 2 - NG 10/07/22 Synovial fluid MRSA 10/21/22 Synovium MRSA Vanco MIC 2   Assessment/Plan:  MRSA bacteremia 10/02/22 Repeat BC 112/5/23 NG TEE 10/07/22 No endocarditis Has been on daptomycin    Left knee septic arthritis due to MRSA 11/22 washout MRSA 10/07/22 washout MRSA 10/21/22 washout MRSA-- the vancomycin MIC has increased to 2 from 1 before  added Ceftaroline  as dual MRSA coverage   daptomycin MIC has been sent to [facility] Unable to use rifampin instead because he is on eliquis HE is going to need 6 weeks of IV antibiotic from his last  washout - 12/01/22 Final antibiotic will be decided depending on the MIC of dapto  Influenza A resp illness -on Tamiflu   DVT- Non occlusive clot left peroneal vein on 11/22- on eliquis   Anemia He has dizziness on standing- could be postural orthostasis ? Need for PRBC ? H/o complicated UTI due to MRSA in May 2023 HE has urethral stricture and gets frequent dilatation Also has bladder diverticulum This may put him ar risk for MRSA colonization of urine   H/o ca bladder  Discussed the management with patient

## 2022-10-29 NOTE — TOC Progression Note (Signed)
Transition of Care [facility]) - Progression Note    Patient Details  Name: [name] MRN: [id number] Date of Birth: Feb 28, 1946  Transition of Care [facility]) CM/SW [place], RN Phone Number: 10/29/2022, 10:40 AM  Clinical Narrative:      The patient was approved to go to [facility] with his Insurance and then tested Pos for flu, He is not able to go to [facility] for 7 days  They will reassess on Thursday to see if they can approve him to come back and then ins will need to be restarted as it expires today at midnight      Expected Discharge Plan and Services                                                 Social Determinants of Health (SDOH) Interventions    Readmission Risk Interventions     No data to display

## 2022-10-29 NOTE — Progress Notes (Signed)
PROGRESS NOTE    [name]   FBP:[id number] DOB: 11-30-1945  DOA: 10/21/2022 Date of Service: 10/29/22 PCP: [name], MD     Brief Narrative / Hospital Course:  Patient is a 76 year old male with past medical history of type 1 diabetes mellitus on insulin pump, history of left leg DVT on Eliquis, CAD status post angioplasty, bladder cancer and previous history of MRSA infection who presented to the emergency room on 11/22 and was admitted to the hospitalist service for recurrent septic arthritis of left knee.    Patient underwent irrigation debridement and partial meniscectomy on 11/23 with repeat irrigation debridement on 11/27 and was placed on IV daptomycin for 6 weeks.  He was discharged on 12/4 to skilled nursing.    Patient was seen in the orthopedic office on the morning of 12/11 for suture removal and noted to have purulent drainage coming from the wound.  Orthopedics  took patient to the OR on 12/11 afternoon for repeat arthroscopic debridement and hospitalists were contacted for admission. Hemovac drain removed 10/23/22, possible need for additional washout on 10/24/22. Cultures showing few staph aureus.  Gram stain with rare gram + cocci in singles - pending susceptibilities as of 12/15 AM.  Patient is currently on Daptomycin. Culture came back as (+)MRSA, given lack of substantial improvement, ID would like to change abx and keep him here through the weekend. SNF rehab plan for Monday if doing well.   Evening 12/15 elevated temp and cough, ordered CXR and repeat BCx and UA though primary concern is for knee. 12/16 reviewed results - CXR concerning for interstitial edema/pneumonia worse on R, remains w/ fever, SpO2 documented as 70s on RA asked RN team to reassess and confirm if on O2. AM labs show normal WBC, Hgb to 7.6. UA not collected. Asked ID on call (Dr Linus Salmons) to review abx and determine if any changes needed - he confirmed ok to continue w/ current plan. UA did  return w/ large Hgb, no leuks/WBC but showing >50 WBC/HPF on micorscopy - await culture results. Serum WBC normal - not meeting sepsis criteria. Hypoxic on room air improved on [place]. 12/17 CT neg PE, did show bronchial thickening w/ mucoid impaction in lower lobes c/w infectious vs inflammatory bronchitis, mild emphysema, reflux IV contrast in the hepatic veins suggestive of increased right heart pressure. Incentive spirometry and another dose lasix po ordered for today but held d/t soft BP. Remains afebrile and denies SOB 12/17-12/18, but tested (+)influenza, started tamiflu. SNF placement delayed also d/t cost of abx, ID following for alternative, awaiting send-out labs for MIC on daptomycin.     Consultants:  Orthopedic surgery Infectious disease   Procedures: 10/21/22 Arthroscopic irrigation debridement of left knee (Dr [name] - ortho surgery)        ASSESSMENT & PLAN:   Principal Problem:   Septic arthritis of knee, left ([place]) Active Problems:   Chronic systolic CHF (congestive heart failure) ([place])   Uncontrolled type 1 diabetes mellitus with hyperglycemia, with long-term current use of insulin ([name])   DVT (deep venous thrombosis) ([name])   HYPERTENSION, BENIGN   Chronic constipation   Overweight (BMI 25.0-29.9)   Septic arthritis of knee, left ([place]) MRSA Appreciate orthopedic intervention and ID guidance.   Status post repeat arthroscopic debridement 10/21/22.   Continue IV antibiotics per ID --> added ceftaroline to daptomycin 10/25/22 but this Rx is prohibitively expensive to continue, awaiting MIC for daptomycin which was a send-out lab, ID following  Once abx plan  in place, anticipate will be appropriate for discharge pending any new developments    Influenza A Fever - resolved Tamiflu   Acute hypoxic respiratory failure - improved  D/t mild CHF exacerbation, Influenza  Supplemental O2 as needed Intermittent diuresis - caution w/ soft BP  Chronic systolic CHF  (congestive heart failure) ([place]) At patient's last hospitalization, 2D echo and TEE confirmed ejection fraction of 35 to 40%.  Lasix daily - extra dose prn    Uncontrolled type 1 diabetes mellitus with hyperglycemia, with long-term current use of insulin ([name]) Resume home medications and sliding scale   DVT (deep venous thrombosis) ([name]) on Eliquis.     Essential hypertension w/ soft BP here  Stable Will resume home medications when blood pressure starts to rise   Chronic constipation Will start Colace and lactulose.   Overweight (BMI 25.0-29.9) Meets criteria BMI greater than 25    DVT prophylaxis: eliquis Pertinent IV fluids/nutrition: no continuous IV fluids Central lines / invasive devices: none  Code Status: FULL CODE  Disposition: inpatient TOC needs: SNF Barriers to discharge / significant pending items: awaiting SNF flu protocol              Subjective:  Patient reports no concerns - no subjective fever Denies CP/SOB Pain controlled.  Denies new weakness.  Tolerating diet.  Reports no concerns w/ urination/defecation.   Family Communication: Will call later today     Objective Findings:  Vitals:   10/28/22 1350 10/28/22 1547 10/28/22 2309 10/29/22 0819  BP:  (!) 115/55 119/67 139/62  Pulse:  66 70 67  Resp:  '15 17 18  '$ Temp:   98.1 F (36.7 C) 98.1 F (36.7 C)  TempSrc:      SpO2: 97% 93% 95% 96%  Weight:      Height:        Intake/Output Summary (Last 24 hours) at 10/29/2022 1318 Last data filed at 10/29/2022 0301 Gross per 24 hour  Intake 274 ml  Output 150 ml  Net 124 ml    Filed Weights   10/21/22 1454  Weight: 86.2 kg    Examination:  Constitutional:  VS as above General Appearance: alert, NAD Respiratory: Normal respiratory effort No wheeze No rhonchi No rales Cardiovascular: S1/S2 normal No rub/gallop auscultated Gastrointestinal: No tenderness Musculoskeletal:  Symmetrical movement in all  extremities Left leg wrapped, this was not disturbed Neurological: No cranial nerve deficit on limited exam Alert Psychiatric: Normal judgment/insight Normal mood and affect       Scheduled Medications:   apixaban  5 mg Oral BID   aspirin EC  81 mg Oral Daily   atorvastatin  40 mg Oral Daily   carvedilol  3.125 mg Oral BID WC   Chlorhexidine Gluconate Cloth  6 each Topical Daily   docusate sodium  100 mg Oral BID   furosemide  40 mg Oral Daily   insulin aspart  0-5 Units Subcutaneous QHS   insulin aspart  0-9 Units Subcutaneous TID WC   insulin glargine-yfgn  28 Units Subcutaneous Daily   lactulose  20 g Oral Daily   multivitamin with minerals  1 tablet Oral Daily   oseltamivir  75 mg Oral BID   pantoprazole  40 mg Oral Daily   polyethylene glycol  17 g Oral Once   senna-docusate  2 tablet Oral Once   sodium chloride flush  10-40 mL Intracatheter Q12H    Continuous Infusions:  ceFTAROline (TEFLARO) IV 600 mg (10/29/22 0631)   DAPTOmycin (CUBICIN) 700  mg in sodium chloride 0.9 % IVPB 700 mg (10/28/22 2323)   promethazine (PHENERGAN) injection (IM or IVPB)      PRN Medications:  acetaminophen **OR** acetaminophen, diphenhydrAMINE, guaiFENesin-codeine, HYDROmorphone (DILAUDID) injection, hyoscyamine, ondansetron **OR** ondansetron (ZOFRAN) IV, mouth rinse, oxyCODONE, promethazine (PHENERGAN) injection (IM or IVPB), sodium chloride flush, Uribel  Antimicrobials:  Anti-infectives (From admission, onward)    Start     Dose/Rate Route Frequency Ordered Stop   10/28/22 1500  oseltamivir (TAMIFLU) capsule 75 mg        75 mg Oral 2 times daily 10/28/22 1402 11/02/22 0959   10/25/22 1530  ceftaroline (TEFLARO) 600 mg in sodium chloride 0.9 % 100 mL IVPB        600 mg 100 mL/hr over 60 Minutes Intravenous Every 8 hours 10/25/22 1420     10/21/22 2200  DAPTOmycin (CUBICIN) 700 mg in sodium chloride 0.9 % IVPB        700 mg 128 mL/hr over 30 Minutes Intravenous Every 24 hours  10/21/22 1854     10/21/22 1930  daptomycin (CUBICIN) IVPB  Status:  Discontinued        700 mg Intravenous Every 24 hours 10/21/22 1835 10/21/22 1854   10/21/22 1835  Uribel 118 MG CAPS 118 mg        1 capsule Oral Every 6 hours PRN 10/21/22 1835     10/21/22 1447  ceFAZolin (ANCEF) IVPB 2g/100 mL premix        2 g 200 mL/hr over 30 Minutes Intravenous 60 min pre-op 10/21/22 1445 10/21/22 1557   10/21/22 1447  ceFAZolin (ANCEF) 2-4 GM/100ML-% IVPB       Note to Pharmacy: Norton Blizzard A: cabinet override      10/21/22 1447 10/21/22 1543           Data Reviewed: I have personally reviewed following labs and imaging studies  CBC: Recent Labs  Lab 10/25/22 0600 10/26/22 0530 10/28/22 0550 10/28/22 1324 10/29/22 0339  WBC 11.0* 8.5 8.1 7.5 8.3  NEUTROABS  --   --   --  4.7  --   HGB 8.3* 7.6* 8.6* 8.9* 9.2*  HCT 25.4* 23.7* 26.9* 27.8* 28.7*  MCV 88.2 89.1 89.4 89.7 88.9  PLT 394 341 379 384 404*    Basic Metabolic Panel: Recent Labs  Lab 10/23/22 0530 10/24/22 0620 10/25/22 0600 10/26/22 0530 10/28/22 0550  NA 140 139 140 140 140  K 3.5 3.3* 3.3* 3.6 3.5  CL 100 101 101 101 98  CO2 33* 33* 33* 34* 37*  GLUCOSE 192* 211* 124* 110* 114*  BUN '23 22 18 20 21  '$ CREATININE 1.20 1.08 0.95 1.10 1.09  CALCIUM 7.9* 7.7* 7.9* 7.9* 8.2*    GFR: Estimated Creatinine Clearance: 63.7 mL/min (by C-G formula based on SCr of 1.09 mg/dL). Liver Function Tests: No results for input(s): "AST", "ALT", "ALKPHOS", "BILITOT", "PROT", "ALBUMIN" in the last 168 hours.  No results for input(s): "LIPASE", "AMYLASE" in the last 168 hours. No results for input(s): "AMMONIA" in the last 168 hours. Coagulation Profile: No results for input(s): "INR", "PROTIME" in the last 168 hours. Cardiac Enzymes: Recent Labs  Lab 10/29/22 0339  CKTOTAL 21*    BNP (last 3 results) No results for input(s): "PROBNP" in the last 8760 hours. HbA1C: No results for input(s): "HGBA1C" in the  last 72 hours. CBG: Recent Labs  Lab 10/28/22 1154 10/28/22 1644 10/28/22 2043 10/29/22 0820 10/29/22 1110  GLUCAP 111* 161* 242* 270* 205*  Lipid Profile: No results for input(s): "CHOL", "HDL", "LDLCALC", "TRIG", "CHOLHDL", "LDLDIRECT" in the last 72 hours. Thyroid Function Tests: No results for input(s): "TSH", "T4TOTAL", "FREET4", "T3FREE", "THYROIDAB" in the last 72 hours. Anemia Panel: No results for input(s): "VITAMINB12", "FOLATE", "FERRITIN", "TIBC", "IRON", "RETICCTPCT" in the last 72 hours. Most Recent Urinalysis On File:     Component Value Date/Time   COLORURINE AMBER (A) 10/26/2022 1251   APPEARANCEUR CLOUDY (A) 10/26/2022 1251   APPEARANCEUR Clear 06/08/2015 1343   LABSPEC 1.027 10/26/2022 1251   PHURINE 5.0 10/26/2022 1251   GLUCOSEU NEGATIVE 10/26/2022 1251   HGBUR LARGE (A) 10/26/2022 1251   BILIRUBINUR NEGATIVE 10/26/2022 1251   BILIRUBINUR Negative 06/08/2015 1343   KETONESUR NEGATIVE 10/26/2022 1251   PROTEINUR 100 (A) 10/26/2022 1251   NITRITE NEGATIVE 10/26/2022 1251   LEUKOCYTESUR NEGATIVE 10/26/2022 1251   Sepsis Labs: '@LABRCNTIP'$ (procalcitonin:4,lacticidven:4)  Recent Results (from the past 240 hour(s))  Aerobic/Anaerobic Culture w Gram Stain (surgical/deep wound)     Status: None (Preliminary result)   Collection Time: 10/21/22  4:01 PM   Specimen: PATH Cytology Misc. fluid; Body Fluid  Result Value Ref Range Status   Specimen Description   Final    WOUND Performed at [facility], [address] [place], [place] [address]    Special Requests   Final    NONE Performed at [facility], [place], [place] [address]    Gram Stain   Final    FEW WBC PRESENT,BOTH PMN AND MONONUCLEAR RARE GRAM POSITIVE COCCI IN SINGLES    Culture   Final    FEW METHICILLIN RESISTANT STAPHYLOCOCCUS AUREUS NO ANAEROBES ISOLATED Sent to [facility] for further susceptibility testing. RESULT REPEATED AND VERIFIED Performed at  [facility], [place] [address] [place], [place] [address]    Report Status PENDING  Incomplete   Organism ID, Bacteria METHICILLIN RESISTANT STAPHYLOCOCCUS AUREUS  Final      Susceptibility   Methicillin resistant staphylococcus aureus - MIC*    CIPROFLOXACIN >=8 RESISTANT Resistant     ERYTHROMYCIN >=8 RESISTANT Resistant     GENTAMICIN <=0.5 SENSITIVE Sensitive     OXACILLIN RESISTANT Resistant     TETRACYCLINE <=1 SENSITIVE Sensitive     VANCOMYCIN 2 SENSITIVE Sensitive     TRIMETH/SULFA <=10 SENSITIVE Sensitive     CLINDAMYCIN <=0.25 SENSITIVE Sensitive     RIFAMPIN <=0.5 SENSITIVE Sensitive     Inducible Clindamycin NEGATIVE Sensitive     LINEZOLID Value in next row Sensitive      SENSITIVEMIC 2    * FEW METHICILLIN RESISTANT STAPHYLOCOCCUS AUREUS  MRSA Next Gen by PCR, Nasal     Status: Abnormal   Collection Time: 10/22/22 10:50 AM   Specimen: Nasal Mucosa; Nasal Swab  Result Value Ref Range Status   MRSA by PCR Next Gen DETECTED (A) NOT DETECTED Final    Comment: CRITICAL RESULT CALLED TO, READ BACK BY AND VERIFIED WITH: RN Ulmer COLLIER AT 1948 10/22/2022 GAA (NOTE) The GeneXpert MRSA Assay (FDA approved for NASAL specimens only), is one component of a comprehensive MRSA colonization surveillance program. It is not intended to diagnose MRSA infection nor to guide or monitor treatment for MRSA infections. Test performance is not FDA approved in patients less than 70 years old. Performed at [facility], [address] [place], [place] [address]   Culture, blood (Routine X 2) w Reflex to ID Panel     Status: None (Preliminary result)   Collection Time:  10/25/22  8:38 PM   Specimen: BLOOD  Result Value Ref Range Status   Specimen Description BLOOD BLOOD LEFT HAND  Final   Special Requests   Final    BOTTLES DRAWN AEROBIC AND ANAEROBIC Blood Culture adequate volume   Culture   Final    NO GROWTH 4 DAYS Performed at [facility], [address], [place], [place] [address]    Report Status PENDING  Incomplete  Culture, blood (Routine X 2) w Reflex to ID Panel     Status: None (Preliminary result)   Collection Time: 10/25/22  8:38 PM   Specimen: BLOOD  Result Value Ref Range Status   Specimen Description BLOOD BLOOD RIGHT HAND  Final   Special Requests   Final    IN PEDIATRIC BOTTLE Blood Culture results may not be optimal due to an excessive volume of blood received in culture bottles   Culture   Final    NO GROWTH 4 DAYS Performed at [facility], [address] [place], [place] [address]    Report Status PENDING  Incomplete  Urine Culture     Status: None   Collection Time: 10/26/22 12:51 PM   Specimen: Urine, Random  Result Value Ref Range Status   Specimen Description   Final    URINE, RANDOM Performed at [facility], [address], [place], [place] [address]    Special Requests   Final    NONE Performed at [facility], [address] [place], [place] [address]    Culture   Final    NO GROWTH Performed at [facility], [place] [address] [place], [place] [address]    Report Status 10/27/2022 FINAL  Final  Respiratory (~20 pathogens) panel by PCR     Status: Abnormal   Collection Time: 10/28/22 11:35 AM   Specimen: Nasopharyngeal Swab; Respiratory  Result Value Ref Range Status   Adenovirus NOT DETECTED NOT DETECTED Final   Coronavirus 229E NOT DETECTED NOT DETECTED Final    Comment: (NOTE) The Coronavirus on the Respiratory Panel, DOES NOT test for the novel  Coronavirus (2019 nCoV)    Coronavirus HKU1 NOT DETECTED NOT DETECTED Final   Coronavirus NL63 NOT DETECTED NOT DETECTED Final   Coronavirus OC43 NOT DETECTED NOT DETECTED Final   Metapneumovirus NOT DETECTED NOT DETECTED Final   Rhinovirus / Enterovirus NOT DETECTED NOT DETECTED Final   Influenza A H1 2009 DETECTED (A) NOT DETECTED Final   Influenza B NOT DETECTED NOT DETECTED Final   Parainfluenza Virus 1  NOT DETECTED NOT DETECTED Final   Parainfluenza Virus 2 NOT DETECTED NOT DETECTED Final   Parainfluenza Virus 3 NOT DETECTED NOT DETECTED Final   Parainfluenza Virus 4 NOT DETECTED NOT DETECTED Final   Respiratory Syncytial Virus NOT DETECTED NOT DETECTED Final   Bordetella pertussis NOT DETECTED NOT DETECTED Final   Bordetella Parapertussis NOT DETECTED NOT DETECTED Final   Chlamydophila pneumoniae NOT DETECTED NOT DETECTED Final   Mycoplasma pneumoniae NOT DETECTED NOT DETECTED Final    Comment: Performed at [facility], [place]. [address] [place],  [address]  Resp panel by RT-PCR (RSV, Flu A&B, Covid) Anterior Nasal Swab     Status: Abnormal   Collection Time: 10/28/22 11:35 AM   Specimen: Anterior Nasal Swab  Result Value Ref Range Status   SARS Coronavirus 2 by RT PCR NEGATIVE NEGATIVE Final    Comment: (NOTE) SARS-CoV-2 target nucleic acids are NOT DETECTED.  The SARS-CoV-2  RNA is generally detectable in upper respiratory specimens during the acute phase of infection. The lowest concentration of SARS-CoV-2 viral copies this assay can detect is 138 copies/mL. A negative result does not preclude SARS-Cov-2 infection and should not be used as the sole basis for treatment or other patient management decisions. A negative result may occur with  improper specimen collection/handling, submission of specimen other than nasopharyngeal swab, presence of viral mutation(s) within the areas targeted by this assay, and inadequate number of viral copies(<138 copies/mL). A negative result must be combined with clinical observations, patient history, and epidemiological information. The expected result is Negative.  Fact Sheet for Patients:  EntrepreneurPulse.com.au  Fact Sheet for Healthcare Providers:  IncredibleEmployment.be  This test is no t yet approved or cleared by the [place] FDA and  has been authorized for detection and/or  diagnosis of SARS-CoV-2 by FDA under an Emergency Use Authorization (EUA). This EUA will remain  in effect (meaning this test can be used) for the duration of the COVID-19 declaration under Section 564(b)(1) of the Act, 21 U.S.C.section 360bbb-3(b)(1), unless the authorization is terminated  or revoked sooner.       Influenza A by PCR POSITIVE (A) NEGATIVE Final   Influenza B by PCR NEGATIVE NEGATIVE Final    Comment: (NOTE) The Xpert Xpress SARS-CoV-2/FLU/RSV plus assay is intended as an aid in the diagnosis of influenza from Nasopharyngeal swab specimens and should not be used as a sole basis for treatment. Nasal washings and aspirates are unacceptable for Xpert Xpress SARS-CoV-2/FLU/RSV testing.  Fact Sheet for Patients: EntrepreneurPulse.com.au  Fact Sheet for Healthcare Providers: IncredibleEmployment.be  This test is not yet approved or cleared by the [place] FDA and has been authorized for detection and/or diagnosis of SARS-CoV-2 by FDA under an Emergency Use Authorization (EUA). This EUA will remain in effect (meaning this test can be used) for the duration of the COVID-19 declaration under Section 564(b)(1) of the Act, 21 U.S.C. section 360bbb-3(b)(1), unless the authorization is terminated or revoked.     Resp Syncytial Virus by PCR NEGATIVE NEGATIVE Final    Comment: (NOTE) Fact Sheet for Patients: EntrepreneurPulse.com.au  Fact Sheet for Healthcare Providers: IncredibleEmployment.be  This test is not yet approved or cleared by the [place] FDA and has been authorized for detection and/or diagnosis of SARS-CoV-2 by FDA under an Emergency Use Authorization (EUA). This EUA will remain in effect (meaning this test can be used) for the duration of the COVID-19 declaration under Section 564(b)(1) of the Act, 21 U.S.C. section 360bbb-3(b)(1), unless the authorization is terminated  or revoked.  Performed at [facility], [address], [place], [place] [address]          Radiology Studies: CT Angio Chest Pulmonary Embolism (PE) W or WO Contrast  Result Date: 10/26/2022 CLINICAL DATA:  Hypoxia, known DVT. EXAM: CT ANGIOGRAPHY CHEST WITH CONTRAST TECHNIQUE: Multidetector CT imaging of the chest was performed using the standard protocol during bolus administration of intravenous contrast. Multiplanar CT image reconstructions and MIPs were obtained to evaluate the vascular anatomy. RADIATION DOSE REDUCTION: This exam was performed according to the departmental dose-optimization program which includes automated exposure control, adjustment of the mA and/or kV according to patient size and/or use of iterative reconstruction technique. CONTRAST:  13m OMNIPAQUE IOHEXOL 350 MG/ML SOLN COMPARISON:  CT chest 04/22/2018 FINDINGS: Cardiovascular: Satisfactory opacification of the pulmonary arteries to the segmental level. No evidence of pulmonary embolism. Normal heart size. No pericardial effusion. LAD coronary artery stent. Mediastinum/Nodes:  No enlarged mediastinal, hilar, or axillary lymph nodes. Thyroid gland, trachea, and esophagus demonstrate no significant findings. Lungs/Pleura: Mild mixed centrilobular and paraseptal emphysema. Diffuse mild bronchial wall thickening with mucoid impaction in the lower lobes. A 0.5 cm subpleural nodule in the left lower lobe and 0.7 cm subpleural nodule at the left lung base are stable compared to 04/22/2018 and therefore considered benign (series 5, image 94, 109). A 0.3 cm subpleural nodule in the right lower lobe is also unchanged compared to 04/22/2018 and also considered benign (series 5, image 80). No pleural effusion or pneumothorax. Upper Abdomen: No acute abnormality. Reflux of intravenous contrast is noted in the hepatic veins. Musculoskeletal: No chest wall abnormality. No acute or significant osseous findings. Review of the  MIP images confirms the above findings. IMPRESSION: 1. No pulmonary embolism. 2. Diffuse mild bronchial wall thickening with mucoid impaction in the lower lobes, consistent with infectious versus inflammatory bronchitis. 3. Mild emphysema. 4. Stable benign bilateral pulmonary nodules. No additional dedicated follow-up imaging is indicated. 5. Reflux of intravenous contrast in the hepatic veins, suggestive of increased right heart pressure. Emphysema (ICD10-J43.9). Electronically Signed   By: Ileana Roup M.D.   On: 10/26/2022 18:48   DG Chest Port 1 View  Result Date: 10/25/2022 CLINICAL DATA:  Fever, cough EXAM: PORTABLE CHEST 1 VIEW COMPARISON:  Previous chest radiograph done on 09/11/2007, CT done on 04/22/2018 FINDINGS: Transverse diameter of heart is in the upper limits of normal. There is possible coronary artery stent. PICC line has been placed through the right upper extremity with tip of the line in superior vena cava. Increased interstitial markings are seen in parahilar regions, more so on the right side. There is no focal consolidation in the peripheral lung fields. There is no pleural effusion or pneumothorax. IMPRESSION: Increased interstitial markings are seen in parahilar regions, more so on the right side suggesting asymmetric interstitial edema or interstitial pneumonia. There is no pleural effusion or pneumothorax. Electronically Signed   By: [place] Picker M.D.   On: 10/25/2022 19:47             LOS: 8 days      Emeterio Reeve, DO [facility] 10/29/2022, 1:18 PM    Dictation software may have been used to generate the above note. Typos may occur and escape review in typed/dictated notes. Please contact Dr Sheppard Coil directly for clarity if needed.  Staff may message me via secure chat in [place]  but this may not receive an immediate response,  please page me for urgent matters!  If 7PM-7AM, please contact night coverage [email or web address]      .

## 2022-10-29 NOTE — Progress Notes (Addendum)
Physical Therapy Treatment Patient Details Name: [name] MRN: [id number] DOB: 09/07/1946 Today's Date: 10/29/2022   History of Present Illness Patient here septic left knee, s/p I & D. WBAT. Pt. has had multiple recent similar admissions.    PT Comments    Pt reports feeling a bit better needing to use BSC.  Attempted to take orthostatics but unable to tolerate time to take them as he felt urge to use bathroom.  6/10 dizzy with drop in supine to sitting documented in flow sheets.  He is able to transfer to/from [facility] with min a x 1 and close guarding.  BP taken again in supine and back to baseline but further decrease in standing is felt due to response and look of pt with transfer.  Generally fatigued with activity and remained in bed after session.  He had hoped to sit in chair but was unable to tolerate more today.  Stated he has been doing HEP on his own to tolerance.  Will plan on orthostatics tomorrow during session.  Discussed with RN.   Recommendations for follow up therapy are one component of a multi-disciplinary discharge planning process, led by the attending physician.  Recommendations may be updated based on patient status, additional functional criteria and insurance authorization.  Follow Up Recommendations  Skilled nursing-short term rehab (<3 hours/day)     Assistance Recommended at Discharge Intermittent Supervision/Assistance  Patient can return home with the following A little help with walking and/or transfers;Assistance with cooking/housework;Assist for transportation;Help with stairs or ramp for entrance;A little help with bathing/dressing/bathroom;Direct supervision/assist for medications management   Equipment Recommendations  None recommended by PT    Recommendations for Other Services       Precautions / Restrictions Precautions Precautions: Fall Precaution Comments: orthostatic Restrictions Weight Bearing Restrictions: Yes LLE Weight  Bearing: Weight bearing as tolerated     Mobility  Bed Mobility Overal bed mobility: Needs Assistance Bed Mobility: Supine to Sit, Sit to Supine     Supine to sit: Supervision Sit to supine: Supervision        Transfers Overall transfer level: Needs assistance Equipment used: Rolling walker (2 wheels) Transfers: Sit to/from Stand Sit to Stand: Min assist, Mod assist                Ambulation/Gait         Gait velocity: decreased     General Gait Details: deferred due to dizziness   Stairs             Wheelchair Mobility    Modified Rankin (Stroke Patients Only)       Balance Overall balance assessment: Needs assistance Sitting-balance support: Feet supported Sitting balance-Leahy Scale: Fair Sitting balance - Comments: increased supervision today due to dizziness and general malaise   Standing balance support: Bilateral upper extremity supported Standing balance-Leahy Scale: Fair Standing balance comment: reliant on RW for dynamic standing activity                            Cognition Arousal/Alertness: Lethargic Behavior During Therapy: WFL for tasks assessed/performed, Flat affect Overall Cognitive Status: Within Functional Limits for tasks assessed                                          Exercises      General Comments  Pertinent Vitals/Pain Pain Assessment Pain Assessment: No/denies pain    Home Living                          Prior Function            PT Goals (current goals can now be found in the care plan section) Progress towards PT goals: Progressing toward goals    Frequency    7X/week      PT Plan Current plan remains appropriate    Co-evaluation              AM-PAC PT "6 Clicks" Mobility   Outcome Measure  Help needed turning from your back to your side while in a flat bed without using bedrails?: None Help needed moving from lying on your back  to sitting on the side of a flat bed without using bedrails?: None Help needed moving to and from a bed to a chair (including a wheelchair)?: A Little Help needed standing up from a chair using your arms (e.g., wheelchair or bedside chair)?: A Little Help needed to walk in hospital room?: A Little Help needed climbing 3-5 steps with a railing? : A Lot 6 Click Score: 19    End of Session Equipment Utilized During Treatment: Gait belt;Oxygen Activity Tolerance: Patient limited by fatigue;Treatment limited secondary to medical complications (Comment) Patient left: in bed;with call bell/phone within reach;with bed alarm set Nurse Communication: Mobility status PT Visit Diagnosis: Muscle weakness (generalized) (M62.81);Difficulty in walking, not elsewhere classified (R26.2);Pain Pain - Right/Left: Left Pain - part of body: Knee     Time: 6301-6010 PT Time Calculation (min) (ACUTE ONLY): 24 min  Charges:  $Therapeutic Activity: 23-37 mins                   .Judson Roch

## 2022-10-29 NOTE — Progress Notes (Signed)
Pharmacy Antibiotic Note  [name] is a 76 y.o. male admitted on 10/21/2022 with septic right knee and MRSA bacteremia discharged on Daptomycin but re-admitted 12/11 as pus seen at suture line at Orthopedic follow-up appointment.  Pharmacy has been consulted for restarting daptomycin dosing.  12/19 CK = 21 (stable) Renal function stable WBC WNL 12/18 influenza A positive 12/15 started ceftaroline and sent out daptomycin MIC to ensure susceptible.  Sent to [facility] - awaiting results  Plan: --Continued  Daptomycin 700 mg (8 mg/kg) every 24 hours  - Continue weekly CK checks - on atorvastatin 40 mg (dose reduced) while on Daptomycin --antibiotic duration to now extend to 12/01/2022 (NOT 11/15/2022) - Will monitor renal function closely for necessary dose adjustments  Height: '5\' 9"'$  (175.3 cm) Weight: 86.2 kg (190 lb) IBW/kg (Calculated) : 70.7  Temp (24hrs), Avg:98.1 F (36.7 C), Min:98.1 F (36.7 C), Max:98.1 F (36.7 C)  Recent Labs  Lab 10/23/22 0530 10/24/22 0620 10/25/22 0600 10/26/22 0530 10/28/22 0550 10/28/22 1324 10/29/22 0339  WBC 11.9* 11.9* 11.0* 8.5 8.1 7.5 8.3  CREATININE 1.20 1.08 0.95 1.10 1.09  --   --     Estimated Creatinine Clearance: 63.7 mL/min (by C-G formula based on SCr of 1.09 mg/dL).    No Known Allergies  Antimicrobials this admission: 11/25 daptomycin >>  12/15 ceftaroline >>  Microbiology results: 11/23 BCx >> 4/4 GPC (BCID MRSA) 11/24 L-knee (OR cx) >> abundant staph aureus 12/11 WCx: MRSA  Thank you for allowing pharmacy to be a part of this patient's care.  Doreene Eland, PharmD, BCPS, BCIDP Work Cell: [phone] 10/29/2022 11:26 AM

## 2022-10-30 DIAGNOSIS — M00062 Staphylococcal arthritis, left knee: Secondary | ICD-10-CM | POA: Diagnosis not present

## 2022-10-30 LAB — GLUCOSE, CAPILLARY
Glucose-Capillary: 118 mg/dL — ABNORMAL HIGH (ref 70–99)
Glucose-Capillary: 130 mg/dL — ABNORMAL HIGH (ref 70–99)
Glucose-Capillary: 223 mg/dL — ABNORMAL HIGH (ref 70–99)
Glucose-Capillary: 90 mg/dL (ref 70–99)
Glucose-Capillary: 139 mg/dL — ABNORMAL HIGH (ref 70–99)

## 2022-10-30 LAB — CBC
HCT: 26.8 % — ABNORMAL LOW (ref 39.0–52.0)
Hemoglobin: 8.7 g/dL — ABNORMAL LOW (ref 13.0–17.0)
MCH: 28.8 pg (ref 26.0–34.0)
MCHC: 32.5 g/dL (ref 30.0–36.0)
MCV: 88.7 fL (ref 80.0–100.0)
Platelets: 397 10*3/uL (ref 150–400)
RBC: 3.02 MIL/uL — ABNORMAL LOW (ref 4.22–5.81)
RDW: 15 % (ref 11.5–15.5)
WBC: 8.4 10*3/uL (ref 4.0–10.5)
nRBC: 0 % (ref 0.0–0.2)

## 2022-10-30 LAB — VITAMIN D 25 HYDROXY (VIT D DEFICIENCY, FRACTURES): Vit D, 25-Hydroxy: 51.43 ng/mL (ref 30–100)

## 2022-10-30 LAB — CULTURE, BLOOD (ROUTINE X 2)
Culture: NO GROWTH
Culture: NO GROWTH
Special Requests: ADEQUATE

## 2022-10-30 LAB — VITAMIN B12: Vitamin B-12: 1409 pg/mL — ABNORMAL HIGH (ref 180–914)

## 2022-10-30 LAB — BASIC METABOLIC PANEL
Anion gap: 5 (ref 5–15)
BUN: 22 mg/dL (ref 8–23)
CO2: 38 mmol/L — ABNORMAL HIGH (ref 22–32)
Calcium: 7.7 mg/dL — ABNORMAL LOW (ref 8.9–10.3)
Chloride: 98 mmol/L (ref 98–111)
Creatinine, Ser: 1.11 mg/dL (ref 0.61–1.24)
GFR, Estimated: >60 mL/min (ref >60)
Glucose, Bld: 133 mg/dL — ABNORMAL HIGH (ref 70–99)
Potassium: 3.4 mmol/L — ABNORMAL LOW (ref 3.5–5.1)
Sodium: 141 mmol/L (ref 135–145)

## 2022-10-30 LAB — FOLATE: Folate: 19.2 ng/mL (ref >5.9)

## 2022-10-30 LAB — IRON AND TIBC
Iron: 27 ug/dL — ABNORMAL LOW (ref 45–182)
Saturation Ratios: 23 % (ref 17.9–39.5)
TIBC: 119 ug/dL — ABNORMAL LOW (ref 250–450)
UIBC: 92 ug/dL

## 2022-10-30 MED ORDER — POTASSIUM CHLORIDE CRYS ER 20 MEQ PO TBCR
40.0000 meq | EXTENDED_RELEASE_TABLET | Freq: Once | ORAL | Status: AC
  Administered 2022-10-30: 40 meq via ORAL
  Filled 2022-10-30: qty 2

## 2022-10-30 NOTE — Progress Notes (Signed)
  Subjective: 9 Days Post-Op Procedure(s) (LRB): ARTHROSCOPIC INCISION AND DRAINAGE KNEE-LEFT (Left) Patient reports no pain in the operative knee at this time.  Patient is well, and has had no acute complaints or problems Plan is to go Skilled nursing facility after hospital stay. Plan is for discharge today to SNF.  Patient recently diagnosis with influenza which has delayed his discharge planning Negative for chest pain and shortness of breath Fever: no Gastrointestinal: negative for nausea and vomiting.  Patient has had multiple bowel movements since surgery.  Objective: Vital signs in last 24 hours: Temp:  [97.4 F (36.3 C)-98.4 F (36.9 C)] 98.4 F (36.9 C) (12/20 0848) Pulse Rate:  [61-66] 61 (12/20 0848) Resp:  [16-18] 16 (12/20 0848) BP: (105-140)/(58-60) 140/60 (12/20 0848) SpO2:  [90 %-95 %] 95 % (12/20 0848)  Intake/Output from previous day:  Intake/Output Summary (Last 24 hours) at 10/30/2022 1125 Last data filed at 10/30/2022 0414 Gross per 24 hour  Intake 240 ml  Output 850 ml  Net -610 ml    Intake/Output this shift: No intake/output data recorded.  Labs: Recent Labs    10/28/22 0550 10/28/22 1324 10/29/22 0339 10/30/22 0353  HGB 8.6* 8.9* 9.2* 8.7*   Recent Labs    10/29/22 0339 10/30/22 0353  WBC 8.3 8.4  RBC 3.23* 3.02*  HCT 28.7* 26.8*  PLT 404* 397   Recent Labs    10/28/22 0550 10/30/22 0353  NA 140 141  K 3.5 3.4*  CL 98 98  CO2 37* 38*  BUN 21 22  CREATININE 1.09 1.11  GLUCOSE 114* 133*  CALCIUM 8.2* 7.7*   No results for input(s): "LABPT", "INR" in the last 72 hours.   EXAM General - Patient is Alert, Appropriate, and Oriented Extremity - Neurovascular intact Dorsiflexion/Plantar flexion intact No cellulitis present Compartment soft Dressing/Incision -clean, dry, no drainage, no erythema over knee  ACE wrap intact without drainage, honeycomb dressings applied over the portal sites without any drainage noted. Motor  Function - intact, moving foot and toes well on exam. Able to perform independent SLR. Can flex and extend knee without pain.  Abdomen soft with intact bowel sounds this morning.   Assessment/Plan: 9 Days Post-Op Procedure(s) (LRB): ARTHROSCOPIC INCISION AND DRAINAGE KNEE-LEFT (Left) Principal Problem:   Septic arthritis of knee, left ([name]) Active Problems:   HYPERTENSION, BENIGN   DVT (deep venous thrombosis) ([place])   Uncontrolled type 1 diabetes mellitus with hyperglycemia, with long-term current use of insulin ([name])   Chronic systolic CHF (congestive heart failure) ([name])   Overweight (BMI 25.0-29.9)   Chronic constipation   Influenza A  Estimated body mass index is 28.06 kg/m as calculated from the following:   Height as of this encounter: '5\' 9"'$  (1.753 m).   Weight as of this encounter: 86.2 kg. Advance diet Up with therapy  Labs and vitals reviewed this AM.  WBC 8.4 this morning. BP 140/60, does report getting dizzy when ambulating at this time. Currently on IV Ceftaroline and Daptomycin.  Continue per ID. No erythema noted to the left knee, no signs of active infection at this time. Continue with woundcare for bilateral ankles. Discharge to SNF when able.  At this time, Orthopaedics will sign off.  If still admitted to the hospital on 11/05/22 we will perform suture removal before the patient is discharged.  DVT Prophylaxis - Eliquis and Ted hose Weight-Bearing as tolerated to left leg  J. Cameron Proud, [name] [facility] 10/30/2022, 11:25 AM

## 2022-10-30 NOTE — Progress Notes (Signed)
Physical Therapy Treatment Patient Details Name: [name] MRN: [id number] DOB: [birth year] Today's Date: 10/30/2022   History of Present Illness Patient here septic left knee, s/p I & D. WBAT. Pt. has had multiple recent similar admissions.    PT Comments    Pt is making slow progress with bed mobility (supervision) and tolerance to functional seated activities without external support (5-45mn)  but tolerance continues to be limited with low BP (see below) and c/o dizziness. Did not attempt OOB activties this session due to pt's low BP.  Current d/c plan is appropriate. Vitals: supine- Pulse 61 bpm, SPO2 94%, BP 134/59.   Seated BP 104/49   Recommendations for follow up therapy are one component of a multi-disciplinary discharge planning process, led by the attending physician.  Recommendations may be updated based on patient status, additional functional criteria and insurance authorization.  Follow Up Recommendations  Skilled nursing-short term rehab (<3 hours/day)     Assistance Recommended at Discharge Intermittent Supervision/Assistance  Patient can return home with the following A little help with walking and/or transfers;Assistance with cooking/housework;Assist for transportation;Help with stairs or ramp for entrance;A little help with bathing/dressing/bathroom;Direct supervision/assist for medications management   Equipment Recommendations  None recommended by PT    Recommendations for Other Services       Precautions / Restrictions Precautions Precautions: Fall Precaution Comments: orthostatic Restrictions Weight Bearing Restrictions: Yes LLE Weight Bearing: Weight bearing as tolerated     Mobility  Bed Mobility Overal bed mobility: Needs Assistance Bed Mobility: Supine to Sit, Sit to Supine     Supine to sit: Supervision Sit to supine: Supervision   General bed mobility comments: cues for hand placements for bed mobilty but after multiple reps did  not need bed rails.    Transfers                   General transfer comment: did not attempt due to dizziness and low BP    Ambulation/Gait               General Gait Details: did not attempt due to dizziness and low BP   Stairs             Wheelchair Mobility    Modified Rankin (Stroke Patients Only)       Balance Overall balance assessment: Needs assistance Sitting-balance support: Feet supported Sitting balance-Leahy Scale: Good Sitting balance - Comments: increased supervision today due to progressive dizziness and general malaise after 5-8 min in seated unsupported position.  Worked on trunk stability and balance with bilateral UE reaches up and forward.       Standing balance comment: did not attempt due to dizziness and low BP                            Cognition Arousal/Alertness: Awake/alert   Overall Cognitive Status: Within Functional Limits for tasks assessed Area of Impairment: Safety/judgement, Problem solving                   Current Attention Level: Focused   Following Commands: Follows one step commands consistently, Follows one step commands with increased time Safety/Judgement: Decreased awareness of safety, Decreased awareness of deficits Awareness: Intellectual            Exercises Total Joint Exercises Ankle Circles/Pumps: AROM, Strengthening, Both, 10 reps, Supine Quad Sets: AROM, Strengthening, Both, 10 reps, Supine Heel Slides: AROM, Strengthening, Both, 10 reps, Supine  General Comments        Pertinent Vitals/Pain Pain Assessment Pain Assessment: Faces Faces Pain Scale: Hurts even more Pain Location: L knee Pain Descriptors / Indicators: Discomfort, Sore    Home Living                          Prior Function            PT Goals (current goals can now be found in the care plan section) Acute Rehab PT Goals Patient Stated Goal: none stated PT Goal Formulation: With  patient Time For Goal Achievement: 11/06/22 Potential to Achieve Goals: Good Progress towards PT goals: Progressing toward goals    Frequency    7X/week      PT Plan Current plan remains appropriate    Co-evaluation              AM-PAC PT "6 Clicks" Mobility   Outcome Measure  Help needed turning from your back to your side while in a flat bed without using bedrails?: None Help needed moving from lying on your back to sitting on the side of a flat bed without using bedrails?: None Help needed moving to and from a bed to a chair (including a wheelchair)?: A Little Help needed standing up from a chair using your arms (e.g., wheelchair or bedside chair)?: A Little Help needed to walk in hospital room?: A Little Help needed climbing 3-5 steps with a railing? : A Lot 6 Click Score: 19    End of Session Equipment Utilized During Treatment: Gait belt;Oxygen Activity Tolerance: Patient limited by fatigue;Treatment limited secondary to medical complications (Comment) Patient left: in bed;with nursing/sitter in room Nurse Communication: Mobility status PT Visit Diagnosis: Muscle weakness (generalized) (M62.81);Difficulty in walking, not elsewhere classified (R26.2);Pain Pain - Right/Left: Left Pain - part of body: Knee     Time: 7517-0017 PT Time Calculation (min) (ACUTE ONLY): 33 min  Charges:  $Therapeutic Exercise: 23-37 mins                     Bjorn Loser, PTA  10/30/22, 9:57 AM

## 2022-10-30 NOTE — Progress Notes (Signed)
[facility] Progress Note  Patient: [name]    ZOX:[id number]  DOA: 10/21/2022     Date of Service: the patient was seen and examined on 10/30/2022  No chief complaint on file.  Brief hospital course: Patient is a 76 year old male with past medical history of type 1 diabetes mellitus on insulin pump, history of left leg DVT on Eliquis, CAD status post angioplasty, bladder cancer and previous history of MRSA infection who presented to the emergency room on 11/22 and was admitted to the hospitalist service for recurrent septic arthritis of left knee.     Patient underwent irrigation debridement and partial meniscectomy on 11/23 with repeat irrigation debridement on 11/27 and was placed on IV daptomycin for 6 weeks.  He was discharged on 12/4 to skilled nursing.     Patient was seen in the orthopedic office on the morning of 12/11 for suture removal and noted to have purulent drainage coming from the wound.  Orthopedics  took patient to the OR on 12/11 afternoon for repeat arthroscopic debridement and hospitalists were contacted for admission. Hemovac drain removed 10/23/22, possible need for additional washout on 10/24/22. Cultures showing few staph aureus.  Gram stain with rare gram + cocci in singles - pending susceptibilities as of 12/15 AM.  Patient is currently on Daptomycin. Culture came back as (+)MRSA, given lack of substantial improvement, ID would like to change abx and keep him here through the weekend. SNF rehab plan for Monday if doing well.    Evening 12/15 elevated temp and cough, ordered CXR and repeat BCx and UA though primary concern is for knee. 12/16 reviewed results - CXR concerning for interstitial edema/pneumonia worse on R, remains w/ fever, SpO2 documented as 70s on RA asked RN team to reassess and confirm if on O2. AM labs show normal WBC, Hgb to 7.6. UA not collected. Asked ID on call (Dr Linus Salmons) to review abx and determine if any changes needed - he  confirmed ok to continue w/ current plan. UA did return w/ large Hgb, no leuks/WBC but showing >50 WBC/HPF on micorscopy - await culture results. Serum WBC normal - not meeting sepsis criteria. Hypoxic on room air improved on [place]. 12/17 CT neg PE, did show bronchial thickening w/ mucoid impaction in lower lobes c/w infectious vs inflammatory bronchitis, mild emphysema, reflux IV contrast in the hepatic veins suggestive of increased right heart pressure. Incentive spirometry and another dose lasix po ordered for today but held d/t soft BP. Remains afebrile and denies SOB 12/17-12/18, but tested (+)influenza, started tamiflu. SNF placement delayed also d/t cost of abx, ID following for alternative, awaiting send-out labs for MIC on daptomycin.        Consultants:  Orthopedic surgery Infectious disease    Procedures: 10/21/22 Arthroscopic irrigation debridement of left knee (Dr [name] - ortho surgery)     Assessment and Plan: Principal Problem:   Septic arthritis of knee, left ([place]) Active Problems:   Chronic systolic CHF (congestive heart failure) ([place])   Uncontrolled type 1 diabetes mellitus with hyperglycemia, with long-term current use of insulin ([name])   DVT (deep venous thrombosis) ([name])   HYPERTENSION, BENIGN   Chronic constipation   Overweight (BMI 25.0-29.9)     Septic arthritis of knee, left ([place]) MRSA Appreciate orthopedic intervention and ID guidance.   Status post repeat arthroscopic debridement 10/21/22.   Continue IV antibiotics per ID --> added ceftaroline to daptomycin 10/25/22 but this Rx is prohibitively expensive to continue, awaiting MIC for daptomycin  which was a send-out lab, ID following  Once abx plan in place, anticipate will be appropriate for discharge pending any new developments    Influenza A Fever - resolved Tamiflu  RN was advised to wean oxygen to room air.   Acute hypoxic respiratory failure - improved  D/t mild CHF exacerbation, Influenza   Supplemental O2 as needed Intermittent diuresis - caution w/ soft BP   Chronic systolic CHF (congestive heart failure) ([place]) At patient's last hospitalization, 2D echo and TEE confirmed ejection fraction of 35 to 40%.  Lasix daily - extra dose prn    Uncontrolled type 1 diabetes mellitus with hyperglycemia, with long-term current use of insulin ([name]) Resume home medications and sliding scale    DVT (deep venous thrombosis) ([name]) on Eliquis.     Essential hypertension w/ soft BP here  Stable Will resume home medications when blood pressure starts to rise   Chronic constipation Will start Colace and lactulose.   Overweight (BMI 25.0-29.9) Meets criteria BMI greater than 25   Body mass index is 28.06 kg/m.   Interventions:   Pressure Injury 10/14/22 Heel Right Deep Tissue Pressure Injury - Purple or maroon localized area of discolored intact skin or blood-filled blister due to damage of underlying soft tissue from pressure and/or shear. purple discolored intact skin (Active)  10/14/22 0830  Location: Heel  Location Orientation: Right  Staging: Deep Tissue Pressure Injury - Purple or maroon localized area of discolored intact skin or blood-filled blister due to damage of underlying soft tissue from pressure and/or shear.  Wound Description (Comments): purple discolored intact skin  Present on Admission: No     Pressure Injury 10/14/22 Heel Left Deep Tissue Pressure Injury - Purple or maroon localized area of discolored intact skin or blood-filled blister due to damage of underlying soft tissue from pressure and/or shear. purple discolored intact skin (Active)  10/14/22 0830  Location: Heel  Location Orientation: Left  Staging: Deep Tissue Pressure Injury - Purple or maroon localized area of discolored intact skin or blood-filled blister due to damage of underlying soft tissue from pressure and/or shear.  Wound Description (Comments): purple discolored intact skin  Present on  Admission: No  Dressing Type Gauze (Comment);ABD;Other (Comment) 10/30/22 0412     Diet: Heart healthy/carb modified DVT Prophylaxis: Therapeutic Anticoagulation with Eliquis    Advance goals of care discussion: Full code  Family Communication: family was NOT present at bedside, at the time of interview.  The pt provided permission to discuss medical plan with the family. Opportunity was given to ask question and all questions were answered satisfactorily.   Disposition:  Pt is from SNF, admitted with left knee septic arthritis, still has infection, on IV antibiotics, awaiting for MIC to daptomycin, which precludes a safe discharge. Discharge to SNF, when cleared by ID most likely in 1 to 2 days.  Subjective: No significant events overnight, patient's pain is well-controlled, 2/10 in the left knee, patient's respiratory status is improving, and does not feel worsening, has mild cough, no any other active issues, denies any chest pain or palpitation.   Physical Exam: General:  alert oriented to time, place, and person.  Appear in no distress, affect appropriate Eyes: PERRLA ENT: Oral Mucosa Clear, moist  Neck: no JVD,  Cardiovascular: S1 and S2 Present, no Murmur,  Respiratory: good respiratory effort, Bilateral Air entry equal and Decreased, no Crackles, no wheezes Abdomen: Bowel Sound present, Soft and no tenderness,  Skin: no rashes Extremities: no Pedal edema, no calf  tenderness, Left Knee mild swelling, dressing CDI Neurologic: without any new focal findings Gait not checked due to patient safety concerns  Vitals:   10/29/22 1523 10/29/22 2058 10/30/22 0022 10/30/22 0848  BP: (!) 120/58 (!) 105/59 (!) 111/59 (!) 140/60  Pulse: 66 65 63 61  Resp: '18 18 16 16  '$ Temp: 98.2 F (36.8 C) (!) 97.4 F (36.3 C) 98.1 F (36.7 C) 98.4 F (36.9 C)  TempSrc:      SpO2: 93% 90% 95% 95%  Weight:      Height:        Intake/Output Summary (Last 24 hours) at 10/30/2022 1603 Last  data filed at 10/30/2022 0414 Gross per 24 hour  Intake --  Output 300 ml  Net -300 ml   Filed Weights   10/21/22 1454  Weight: 86.2 kg    Data Reviewed: I have personally reviewed and interpreted daily labs, tele strips, imagings as discussed above. I reviewed all nursing notes, pharmacy notes, vitals, pertinent old records I have discussed plan of care as described above with RN and patient/family.  CBC: Recent Labs  Lab 10/26/22 0530 10/28/22 0550 10/28/22 1324 10/29/22 0339 10/30/22 0353  WBC 8.5 8.1 7.5 8.3 8.4  NEUTROABS  --   --  4.7  --   --   HGB 7.6* 8.6* 8.9* 9.2* 8.7*  HCT 23.7* 26.9* 27.8* 28.7* 26.8*  MCV 89.1 89.4 89.7 88.9 88.7  PLT 341 379 384 404* 638   Basic Metabolic Panel: Recent Labs  Lab 10/24/22 0620 10/25/22 0600 10/26/22 0530 10/28/22 0550 10/30/22 0353  NA 139 140 140 140 141  K 3.3* 3.3* 3.6 3.5 3.4*  CL 101 101 101 98 98  CO2 33* 33* 34* 37* 38*  GLUCOSE 211* 124* 110* 114* 133*  BUN '22 18 20 21 22  '$ CREATININE 1.08 0.95 1.10 1.09 1.11  CALCIUM 7.7* 7.9* 7.9* 8.2* 7.7*    Studies: No results found.  Scheduled Meds:  apixaban  5 mg Oral BID   aspirin EC  81 mg Oral Daily   atorvastatin  40 mg Oral Daily   carvedilol  3.125 mg Oral BID WC   Chlorhexidine Gluconate Cloth  6 each Topical Daily   docusate sodium  100 mg Oral BID   furosemide  40 mg Oral Daily   insulin aspart  0-5 Units Subcutaneous QHS   insulin aspart  0-9 Units Subcutaneous TID WC   insulin glargine-yfgn  28 Units Subcutaneous Daily   lactulose  20 g Oral Daily   multivitamin with minerals  1 tablet Oral Daily   oseltamivir  75 mg Oral BID   pantoprazole  40 mg Oral Daily   polyethylene glycol  17 g Oral Once   senna-docusate  2 tablet Oral Once   sodium chloride flush  10-40 mL Intracatheter Q12H   Continuous Infusions:  ceFTAROline (TEFLARO) IV 600 mg (10/30/22 1440)   DAPTOmycin (CUBICIN) 700 mg in sodium chloride 0.9 % IVPB 700 mg (10/29/22 2116)    promethazine (PHENERGAN) injection (IM or IVPB)     PRN Meds: acetaminophen **OR** acetaminophen, diphenhydrAMINE, guaiFENesin-codeine, HYDROmorphone (DILAUDID) injection, hyoscyamine, ondansetron **OR** ondansetron (ZOFRAN) IV, mouth rinse, oxyCODONE, promethazine (PHENERGAN) injection (IM or IVPB), sodium chloride flush, Uribel  Time spent: 35 minutes  Author: Val Riles. MD [facility] 10/30/2022 4:03 PM  To reach On-call, see care teams to locate the attending and reach out to them via [email or web address]. If 7PM-7AM, please contact night-coverage If you still have difficulty reaching  the attending provider, please page the [facility] (Director on Call) for [facility] on amion for assistance.

## 2022-10-31 ENCOUNTER — Inpatient Hospital Stay: Payer: Medicare HMO | Admitting: Infectious Diseases

## 2022-10-31 DIAGNOSIS — J101 Influenza due to other identified influenza virus with other respiratory manifestations: Secondary | ICD-10-CM | POA: Diagnosis not present

## 2022-10-31 DIAGNOSIS — M00062 Staphylococcal arthritis, left knee: Secondary | ICD-10-CM | POA: Diagnosis not present

## 2022-10-31 LAB — BASIC METABOLIC PANEL
Anion gap: 6 (ref 5–15)
BUN: 20 mg/dL (ref 8–23)
CO2: 35 mmol/L — ABNORMAL HIGH (ref 22–32)
Calcium: 7.8 mg/dL — ABNORMAL LOW (ref 8.9–10.3)
Chloride: 101 mmol/L (ref 98–111)
Creatinine, Ser: 1.08 mg/dL (ref 0.61–1.24)
GFR, Estimated: >60 mL/min (ref >60)
Glucose, Bld: 103 mg/dL — ABNORMAL HIGH (ref 70–99)
Potassium: 3.3 mmol/L — ABNORMAL LOW (ref 3.5–5.1)
Sodium: 142 mmol/L (ref 135–145)

## 2022-10-31 LAB — CBC
HCT: 26.9 % — ABNORMAL LOW (ref 39.0–52.0)
Hemoglobin: 8.6 g/dL — ABNORMAL LOW (ref 13.0–17.0)
MCH: 28.3 pg (ref 26.0–34.0)
MCHC: 32 g/dL (ref 30.0–36.0)
MCV: 88.5 fL (ref 80.0–100.0)
Platelets: 402 10*3/uL — ABNORMAL HIGH (ref 150–400)
RBC: 3.04 MIL/uL — ABNORMAL LOW (ref 4.22–5.81)
RDW: 15.1 % (ref 11.5–15.5)
WBC: 8.8 10*3/uL (ref 4.0–10.5)
nRBC: 0 % (ref 0.0–0.2)

## 2022-10-31 LAB — GLUCOSE, CAPILLARY
Glucose-Capillary: 108 mg/dL — ABNORMAL HIGH (ref 70–99)
Glucose-Capillary: 323 mg/dL — ABNORMAL HIGH (ref 70–99)
Glucose-Capillary: 156 mg/dL — ABNORMAL HIGH (ref 70–99)
Glucose-Capillary: 92 mg/dL (ref 70–99)

## 2022-10-31 LAB — MAGNESIUM: Magnesium: 2.1 mg/dL (ref 1.7–2.4)

## 2022-10-31 LAB — PHOSPHORUS: Phosphorus: 2.6 mg/dL (ref 2.5–4.6)

## 2022-10-31 MED ORDER — POTASSIUM CHLORIDE CRYS ER 20 MEQ PO TBCR
40.0000 meq | EXTENDED_RELEASE_TABLET | Freq: Once | ORAL | Status: AC
  Administered 2022-10-31: 40 meq via ORAL
  Filled 2022-10-31: qty 2

## 2022-10-31 NOTE — Progress Notes (Signed)
Physical Therapy Treatment Patient Details Name: [name] MRN: [id number] DOB: 04-14-1946 Today's Date: 10/31/2022   History of Present Illness Patient here septic left knee, s/p I & D. WBAT. Pt. has had multiple recent similar admissions.    PT Comments    Pt pleasantly confused and agreeable to out of bed. Pt remains on 2L O2 with sats at 93% sitting EOB. Transfers from bed and BSC requiring MinA and vc's for hand placement. Pt continues to experience bouts of loose stool. Attempted gait training with RW, after ~10 feet pt became very dizzy and shaky. He was assisted to the chair until symptoms resolved and again transferred to recliner with LE's elevated. No BP machine in room for assessment. Pt's symptoms had resolved after 2 minutes, nursing notified. Continue to progress with ease.    Recommendations for follow up therapy are one component of a multi-disciplinary discharge planning process, led by the attending physician.  Recommendations may be updated based on patient status, additional functional criteria and insurance authorization.  Follow Up Recommendations  Skilled nursing-short term rehab (<3 hours/day) Can patient physically be transported by private vehicle: Yes   Assistance Recommended at Discharge Intermittent Supervision/Assistance  Patient can return home with the following A little help with walking and/or transfers;Assistance with cooking/housework;Assist for transportation;Help with stairs or ramp for entrance;A little help with bathing/dressing/bathroom;Direct supervision/assist for medications management   Equipment Recommendations  None recommended by PT    Recommendations for Other Services       Precautions / Restrictions Precautions Precautions: Fall Precaution Comments: orthostatic Restrictions Weight Bearing Restrictions: Yes LLE Weight Bearing: Weight bearing as tolerated     Mobility  Bed Mobility Overal bed mobility: Needs  Assistance Bed Mobility: Supine to Sit, Sit to Supine     Supine to sit: Supervision Sit to supine: Supervision   General bed mobility comments: cues for hand placements for bed mobilty but after multiple reps did not need bed rails.    Transfers Overall transfer level: Needs assistance Equipment used: Rolling walker (2 wheels) Transfers: Sit to/from Stand, Bed to chair/wheelchair/BSC Sit to Stand: Min assist Stand pivot transfers: Min assist Step pivot transfers: Min guard, Min assist       General transfer comment: Significant dizziness with standing    Ambulation/Gait Ambulation/Gait assistance: Min guard Gait Distance (Feet): 10 Feet Assistive device: Rolling walker (2 wheels) Gait Pattern/deviations: Step-through pattern, Antalgic Gait velocity: decreased     General Gait Details: Pt became very dizzy and shakey after 66f requiring quick assistance to chair.   Stairs             Wheelchair Mobility    Modified Rankin (Stroke Patients Only)       Balance Overall balance assessment: Needs assistance Sitting-balance support: Feet supported Sitting balance-Leahy Scale: Good     Standing balance support: Bilateral upper extremity supported, During functional activity Standing balance-Leahy Scale: Fair Standing balance comment: Only able to tolerate ~30 seconds in standing prior to feeling symptomatic                            Cognition Arousal/Alertness: Awake/alert Behavior During Therapy: WFL for tasks assessed/performed Overall Cognitive Status: Within Functional Limits for tasks assessed Area of Impairment: Safety/judgement, Problem solving                   Current Attention Level: Focused   Following Commands: Follows one step commands consistently, Follows one step commands  with increased time Safety/Judgement: Decreased awareness of safety, Decreased awareness of deficits Awareness: Intellectual Problem Solving: Slow  processing, Difficulty sequencing, Requires verbal cues General Comments: Pt is alert and cooperative but remains disoriented overall. Oriented to self but disoriented to day, overall situation, and not at baseline cognition.        Exercises Total Joint Exercises Ankle Circles/Pumps: AROM, Strengthening, Both, 10 reps, Supine Quad Sets: AROM, Strengthening, Both, 10 reps, Supine Heel Slides: AROM, Strengthening, Both, 10 reps, Supine    General Comments General comments (skin integrity, edema, etc.): Pt required assistance to chair to prevent fall due to quick onset of dizziness in standing. No BP machine in room to assess      Pertinent Vitals/Pain Pain Assessment Pain Assessment: 0-10 Pain Score: 2  Pain Location: L knee Pain Descriptors / Indicators: Discomfort, Sore Pain Intervention(s): Monitored during session    Home Living                          Prior Function            PT Goals (current goals can now be found in the care plan section) Acute Rehab PT Goals Patient Stated Goal: none stated    Frequency    7X/week      PT Plan Current plan remains appropriate    Co-evaluation              AM-PAC PT "6 Clicks" Mobility   Outcome Measure  Help needed turning from your back to your side while in a flat bed without using bedrails?: None Help needed moving from lying on your back to sitting on the side of a flat bed without using bedrails?: None Help needed moving to and from a bed to a chair (including a wheelchair)?: A Little Help needed standing up from a chair using your arms (e.g., wheelchair or bedside chair)?: A Little Help needed to walk in hospital room?: A Little Help needed climbing 3-5 steps with a railing? : A Lot 6 Click Score: 19    End of Session Equipment Utilized During Treatment: Gait belt;Oxygen Activity Tolerance: Patient limited by fatigue;Other (comment) (and Orthostatic Hypotension) Patient left: in chair;with  call bell/phone within reach;with nursing/sitter in room Nurse Communication: Mobility status (Symptoms in standing) PT Visit Diagnosis: Muscle weakness (generalized) (M62.81);Difficulty in walking, not elsewhere classified (R26.2);Pain Pain - Right/Left: Left Pain - part of body: Knee     Time: 1093-2355 PT Time Calculation (min) (ACUTE ONLY): 56 min  Charges:  $Gait Training: 8-22 mins $Therapeutic Exercise: 8-22 mins $Therapeutic Activity: 23-37 mins                    Mikel Cella, PTA    Josie Dixon 10/31/2022, 1:50 PM

## 2022-10-31 NOTE — Progress Notes (Signed)
Date of Admission:  10/21/2022      ID: [name] is a 76 y.o. male Principal Problem:   Septic arthritis of knee, left ([place]) Active Problems:   HYPERTENSION, BENIGN   DVT (deep venous thrombosis) ([place])   Uncontrolled type 1 diabetes mellitus with hyperglycemia, with long-term current use of insulin ([name])   Chronic systolic CHF (congestive heart failure) ([name])   Overweight (BMI 25.0-29.9)   Chronic constipation   Influenza A    Subjective: Feeling better  No fever Cough better Left knee pain better Overall feeling better  Medications:   apixaban  5 mg Oral BID   aspirin EC  81 mg Oral Daily   atorvastatin  40 mg Oral Daily   carvedilol  3.125 mg Oral BID WC   Chlorhexidine Gluconate Cloth  6 each Topical Daily   docusate sodium  100 mg Oral BID   furosemide  40 mg Oral Daily   insulin aspart  0-5 Units Subcutaneous QHS   insulin aspart  0-9 Units Subcutaneous TID WC   insulin glargine-yfgn  28 Units Subcutaneous Daily   lactulose  20 g Oral Daily   multivitamin with minerals  1 tablet Oral Daily   oseltamivir  75 mg Oral BID   pantoprazole  40 mg Oral Daily   polyethylene glycol  17 g Oral Once   senna-docusate  2 tablet Oral Once   sodium chloride flush  10-40 mL Intracatheter Q12H    Objective: Vital signs in last 24 hours: Temp:  [98 F (36.7 C)-98.4 F (36.9 C)] 98.4 F (36.9 C) (12/21 0735) Pulse Rate:  [63-65] 63 (12/21 0735) Resp:  [16-17] 16 (12/21 0735) BP: (114-138)/(54-64) 138/64 (12/21 0735) SpO2:  [92 %-94 %] 94 % (12/21 0735)  LDA Rt PICC  PHYSICAL EXAM:  General: Alert, cooperative, no distress at rest Lungs: Clear to auscultation bilaterally. No Wheezing or Rhonchi. No rales. Heart: Regular rate and rhythm, no murmur, rub or gallop. Abdomen: Soft, non-tender,not distended. Bowel sounds normal. No masses Extremities: left knee surgical site covered with dressing-  B/l heel pressure injury- covered with dressing Skin: No  rashes or lesions. Or bruising Lymph: Cervical, supraclavicular normal. Neurologic: Grossly non-focal  Lab Results Recent Labs    10/30/22 0353 10/31/22 0500  WBC 8.4 8.8  HGB 8.7* 8.6*  HCT 26.8* 26.9*  NA 141 142  K 3.4* 3.3*  CL 98 101  CO2 38* 35*  BUN 22 20  CREATININE 1.11 1.08    Microbiology:  10/03/22 2 sets blood culture MRSA 10/03/22 - left knee - MRSA 10/05/22 BC X 2 - NG 10/07/22 Synovial fluid MRSA 10/21/22 Synovium MRSA Vanco MIC 2   Assessment/Plan:  MRSA bacteremia 10/02/22 Repeat BC 112/5/23 NG TEE 10/07/22 No endocarditis Has been on daptomycin    Left knee septic arthritis due to MRSA 11/22 washout MRSA 10/07/22 washout MRSA 10/21/22 washout MRSA-- the vancomycin MIC has increased to 2 from 1 before .  Added Ceftaroline  as dual MRSA coverage  Daptomycin MIC has been sent to [facility] Unable to use rifampin instead because he is on eliquis HE is going to need 6 weeks of IV antibiotic from his last washout - 12/01/22 Final antibiotic will be decided depending on the MIC of dapto Continue dual coverage now  Influenza A resp illness -on Tamiflu   DVT- Non occlusive clot left peroneal vein on 11/22- on eliquis   Anemia He has dizziness on standing- could be postural orthostasis ? Need  for PRBC ? H/o complicated UTI due to MRSA in May 2023 HE has urethral stricture and gets frequent dilatation Also has bladder diverticulum This may put him ar risk for MRSA colonization of urine   H/o ca bladder  Discussed the management with patient

## 2022-10-31 NOTE — Progress Notes (Signed)
[facility] Progress Note  Patient: [name]    BMW:[id number]  DOA: 10/21/2022     Date of Service: the patient was seen and examined on 10/31/2022  No chief complaint on file.  Brief hospital course: Patient is a 76 year old male with past medical history of type 1 diabetes mellitus on insulin pump, history of left leg DVT on Eliquis, CAD status post angioplasty, bladder cancer and previous history of MRSA infection who presented to the emergency room on 11/22 and was admitted to the hospitalist service for recurrent septic arthritis of left knee.     Patient underwent irrigation debridement and partial meniscectomy on 11/23 with repeat irrigation debridement on 11/27 and was placed on IV daptomycin for 6 weeks.  He was discharged on 12/4 to skilled nursing.     Patient was seen in the orthopedic office on the morning of 12/11 for suture removal and noted to have purulent drainage coming from the wound.  Orthopedics  took patient to the OR on 12/11 afternoon for repeat arthroscopic debridement and hospitalists were contacted for admission. Hemovac drain removed 10/23/22, possible need for additional washout on 10/24/22. Cultures showing few staph aureus.  Gram stain with rare gram + cocci in singles - pending susceptibilities as of 12/15 AM.  Patient is currently on Daptomycin. Culture came back as (+)MRSA, given lack of substantial improvement, ID would like to change abx and keep him here through the weekend. SNF rehab plan for Monday if doing well.    Evening 12/15 elevated temp and cough, ordered CXR and repeat BCx and UA though primary concern is for knee. 12/16 reviewed results - CXR concerning for interstitial edema/pneumonia worse on R, remains w/ fever, SpO2 documented as 70s on RA asked RN team to reassess and confirm if on O2. AM labs show normal WBC, Hgb to 7.6. UA not collected. Asked ID on call (Dr Linus Salmons) to review abx and determine if any changes needed - he  confirmed ok to continue w/ current plan. UA did return w/ large Hgb, no leuks/WBC but showing >50 WBC/HPF on micorscopy - await culture results. Serum WBC normal - not meeting sepsis criteria. Hypoxic on room air improved on [place]. 12/17 CT neg PE, did show bronchial thickening w/ mucoid impaction in lower lobes c/w infectious vs inflammatory bronchitis, mild emphysema, reflux IV contrast in the hepatic veins suggestive of increased right heart pressure. Incentive spirometry and another dose lasix po ordered for today but held d/t soft BP. Remains afebrile and denies SOB 12/17-12/18, but tested (+)influenza, started tamiflu. SNF placement delayed also d/t cost of abx, ID following for alternative, awaiting send-out labs for MIC on daptomycin.        Consultants:  Orthopedic surgery Infectious disease    Procedures: 10/21/22 Arthroscopic irrigation debridement of left knee (Dr [name] - ortho surgery)     Assessment and Plan: Principal Problem:   Septic arthritis of knee, left ([place]) Active Problems:   Chronic systolic CHF (congestive heart failure) ([place])   Uncontrolled type 1 diabetes mellitus with hyperglycemia, with long-term current use of insulin ([name])   DVT (deep venous thrombosis) ([name])   HYPERTENSION, BENIGN   Chronic constipation   Overweight (BMI 25.0-29.9)     Septic arthritis of knee, left ([place]) MRSA Appreciate orthopedic intervention and ID guidance.   Status post repeat arthroscopic debridement 10/21/22.   Continue IV antibiotics per ID --> added ceftaroline to daptomycin 10/25/22 but this Rx is prohibitively expensive to continue, awaiting MIC for daptomycin  which was a send-out lab, ID following  Once abx plan in place, anticipate will be appropriate for discharge pending any new developments    Influenza A Fever - resolved Tamiflu  RN was advised to wean oxygen to room air. As per TOC patient cannot go to rehab for 7 days due to flu virus infection    Acute hypoxic  respiratory failure - improved  D/t mild CHF exacerbation, Influenza  Supplemental O2 as needed Intermittent diuresis - caution w/ soft BP   Chronic systolic CHF (congestive heart failure) ([place]) At patient's last hospitalization, 2D echo and TEE confirmed ejection fraction of 35 to 40%.  Lasix daily - extra dose prn    Uncontrolled type 1 diabetes mellitus with hyperglycemia, with long-term current use of insulin ([name]) Resume home medications and sliding scale    DVT (deep venous thrombosis) ([name]) on Eliquis.     Essential hypertension w/ soft BP here  Stable Will resume home medications when blood pressure starts to rise   Chronic constipation Will start Colace and lactulose.   Overweight (BMI 25.0-29.9) Meets criteria BMI greater than 25   Body mass index is 28.06 kg/m.   Interventions:   Pressure Injury 10/14/22 Heel Right Deep Tissue Pressure Injury - Purple or maroon localized area of discolored intact skin or blood-filled blister due to damage of underlying soft tissue from pressure and/or shear. purple discolored intact skin (Active)  10/14/22 0830  Location: Heel  Location Orientation: Right  Staging: Deep Tissue Pressure Injury - Purple or maroon localized area of discolored intact skin or blood-filled blister due to damage of underlying soft tissue from pressure and/or shear.  Wound Description (Comments): purple discolored intact skin  Present on Admission: No     Pressure Injury 10/14/22 Heel Left Deep Tissue Pressure Injury - Purple or maroon localized area of discolored intact skin or blood-filled blister due to damage of underlying soft tissue from pressure and/or shear. purple discolored intact skin (Active)  10/14/22 0830  Location: Heel  Location Orientation: Left  Staging: Deep Tissue Pressure Injury - Purple or maroon localized area of discolored intact skin or blood-filled blister due to damage of underlying soft tissue from pressure and/or shear.   Wound Description (Comments): purple discolored intact skin  Present on Admission: No  Dressing Type Gauze (Comment);ABD;Other (Comment) 10/30/22 0412     Diet: Heart healthy/carb modified DVT Prophylaxis: Therapeutic Anticoagulation with Eliquis    Advance goals of care discussion: Full code  Family Communication: family was NOT present at bedside, at the time of interview.  The pt provided permission to discuss medical plan with the family. Opportunity was given to ask question and all questions were answered satisfactorily.   Disposition:  Pt is from SNF, admitted with left knee septic arthritis, still has infection, on IV antibiotics, awaiting for MIC to daptomycin, which precludes a safe discharge. Discharge to SNF, when cleared by ID most likely in 1 to 2 days.  Subjective: No significant events overnight, no pain in the left knee, patient's respiratory status is improving, and does not feel worsening, has mild cough, no any other active issues, denies any chest pain or palpitation.   Physical Exam: General:  alert oriented to time, place, and person.  Appear in no distress, affect appropriate Eyes: PERRLA ENT: Oral Mucosa Clear, moist  Neck: no JVD,  Cardiovascular: S1 and S2 Present, no Murmur,  Respiratory: good respiratory effort, Bilateral Air entry equal and Decreased, no Crackles, no wheezes Abdomen: Bowel Sound present,  Soft and no tenderness,  Skin: no rashes Extremities: no Pedal edema, no calf tenderness, Left Knee mild swelling, dressing CDI Neurologic: without any new focal findings Gait not checked due to patient safety concerns  Vitals:   10/30/22 0848 10/30/22 1650 10/30/22 2320 10/31/22 0735  BP: (!) 140/60 114/60 (!) 115/54 138/64  Pulse: 61 65 65 63  Resp: '16 16 17 16  '$ Temp: 98.4 F (36.9 C) 98 F (36.7 C) 98.2 F (36.8 C) 98.4 F (36.9 C)  TempSrc:      SpO2: 95% 92% 93% 94%  Weight:      Height:        Intake/Output Summary (Last 24 hours)  at 10/31/2022 1453 Last data filed at 10/31/2022 9937 Gross per 24 hour  Intake 828.03 ml  Output 1000 ml  Net -171.97 ml   Filed Weights   10/21/22 1454  Weight: 86.2 kg    Data Reviewed: I have personally reviewed and interpreted daily labs, tele strips, imagings as discussed above. I reviewed all nursing notes, pharmacy notes, vitals, pertinent old records I have discussed plan of care as described above with RN and patient/family.  CBC: Recent Labs  Lab 10/28/22 0550 10/28/22 1324 10/29/22 0339 10/30/22 0353 10/31/22 0500  WBC 8.1 7.5 8.3 8.4 8.8  NEUTROABS  --  4.7  --   --   --   HGB 8.6* 8.9* 9.2* 8.7* 8.6*  HCT 26.9* 27.8* 28.7* 26.8* 26.9*  MCV 89.4 89.7 88.9 88.7 88.5  PLT 379 384 404* 397 169*   Basic Metabolic Panel: Recent Labs  Lab 10/25/22 0600 10/26/22 0530 10/28/22 0550 10/30/22 0353 10/31/22 0500  NA 140 140 140 141 142  K 3.3* 3.6 3.5 3.4* 3.3*  CL 101 101 98 98 101  CO2 33* 34* 37* 38* 35*  GLUCOSE 124* 110* 114* 133* 103*  BUN '18 20 21 22 20  '$ CREATININE 0.95 1.10 1.09 1.11 1.08  CALCIUM 7.9* 7.9* 8.2* 7.7* 7.8*  MG  --   --   --   --  2.1  PHOS  --   --   --   --  2.6    Studies: No results found.  Scheduled Meds:  apixaban  5 mg Oral BID   aspirin EC  81 mg Oral Daily   atorvastatin  40 mg Oral Daily   carvedilol  3.125 mg Oral BID WC   Chlorhexidine Gluconate Cloth  6 each Topical Daily   docusate sodium  100 mg Oral BID   furosemide  40 mg Oral Daily   insulin aspart  0-5 Units Subcutaneous QHS   insulin aspart  0-9 Units Subcutaneous TID WC   insulin glargine-yfgn  28 Units Subcutaneous Daily   lactulose  20 g Oral Daily   multivitamin with minerals  1 tablet Oral Daily   oseltamivir  75 mg Oral BID   pantoprazole  40 mg Oral Daily   polyethylene glycol  17 g Oral Once   senna-docusate  2 tablet Oral Once   sodium chloride flush  10-40 mL Intracatheter Q12H   Continuous Infusions:  ceFTAROline (TEFLARO) IV 600 mg  (10/31/22 1318)   DAPTOmycin (CUBICIN) 700 mg in sodium chloride 0.9 % IVPB Stopped (10/30/22 2251)   promethazine (PHENERGAN) injection (IM or IVPB)     PRN Meds: acetaminophen **OR** acetaminophen, diphenhydrAMINE, guaiFENesin-codeine, HYDROmorphone (DILAUDID) injection, hyoscyamine, ondansetron **OR** ondansetron (ZOFRAN) IV, mouth rinse, oxyCODONE, promethazine (PHENERGAN) injection (IM or IVPB), sodium chloride flush, Uribel  Time spent:  35 minutes  Author: Val Riles. MD [facility] 10/31/2022 2:53 PM  To reach On-call, see care teams to locate the attending and reach out to them via [email or web address]. If 7PM-7AM, please contact night-coverage If you still have difficulty reaching the attending provider, please page the [facility] (Director on Call) for [facility] on amion for assistance.

## 2022-11-01 DIAGNOSIS — M00062 Staphylococcal arthritis, left knee: Secondary | ICD-10-CM | POA: Diagnosis not present

## 2022-11-01 LAB — C-REACTIVE PROTEIN: CRP: 7.4 mg/dL — ABNORMAL HIGH (ref <1.0)

## 2022-11-01 LAB — AEROBIC/ANAEROBIC CULTURE W GRAM STAIN (SURGICAL/DEEP WOUND)

## 2022-11-01 LAB — GLUCOSE, CAPILLARY
Glucose-Capillary: 54 mg/dL — ABNORMAL LOW (ref 70–99)
Glucose-Capillary: 65 mg/dL — ABNORMAL LOW (ref 70–99)
Glucose-Capillary: 73 mg/dL (ref 70–99)
Glucose-Capillary: 196 mg/dL — ABNORMAL HIGH (ref 70–99)
Glucose-Capillary: 234 mg/dL — ABNORMAL HIGH (ref 70–99)
Glucose-Capillary: 194 mg/dL — ABNORMAL HIGH (ref 70–99)
Glucose-Capillary: 161 mg/dL — ABNORMAL HIGH (ref 70–99)

## 2022-11-01 LAB — MINIMUM INHIBITORY CONC. (1 DRUG)

## 2022-11-01 LAB — MIC RESULT

## 2022-11-01 LAB — SEDIMENTATION RATE: Sed Rate: 83 mm/hr — ABNORMAL HIGH (ref 0–20)

## 2022-11-01 MED ORDER — VANCOMYCIN HCL 1500 MG/300ML IV SOLN
1500.0000 mg | Freq: Once | INTRAVENOUS | Status: AC
  Administered 2022-11-01: 1500 mg via INTRAVENOUS
  Filled 2022-11-01: qty 300

## 2022-11-01 MED ORDER — INSULIN GLARGINE-YFGN 100 UNIT/ML ~~LOC~~ SOLN
22.0000 [IU] | Freq: Every day | SUBCUTANEOUS | Status: DC
  Filled 2022-11-01: qty 0.22

## 2022-11-01 MED ORDER — SODIUM CHLORIDE 0.9 % IV SOLN: INTRAVENOUS | Status: AC

## 2022-11-01 MED ORDER — GUAIFENESIN ER 600 MG PO TB12
600.0000 mg | ORAL_TABLET | Freq: Two times a day (BID) | ORAL | Status: DC
  Administered 2022-11-01 – 2022-11-07 (×13): 600 mg via ORAL
  Filled 2022-11-01 (×13): qty 1

## 2022-11-01 MED ORDER — INSULIN GLARGINE-YFGN 100 UNIT/ML ~~LOC~~ SOLN
11.0000 [IU] | Freq: Once | SUBCUTANEOUS | Status: AC
  Administered 2022-11-01: 11 [IU] via SUBCUTANEOUS
  Filled 2022-11-01: qty 0.11

## 2022-11-01 MED ORDER — CARVEDILOL 3.125 MG PO TABS
3.1250 mg | ORAL_TABLET | Freq: Two times a day (BID) | ORAL | Status: DC
  Administered 2022-11-02 – 2022-11-05 (×8): 3.125 mg via ORAL
  Filled 2022-11-01 (×10): qty 1

## 2022-11-01 MED ORDER — INSULIN GLARGINE-YFGN 100 UNIT/ML ~~LOC~~ SOLN
22.0000 [IU] | Freq: Every day | SUBCUTANEOUS | Status: DC
  Administered 2022-11-02 – 2022-11-07 (×5): 22 [IU] via SUBCUTANEOUS
  Filled 2022-11-01 (×6): qty 0.22

## 2022-11-01 MED ORDER — ATORVASTATIN CALCIUM 20 MG PO TABS
80.0000 mg | ORAL_TABLET | Freq: Every day | ORAL | Status: DC
  Administered 2022-11-02 – 2022-11-07 (×6): 80 mg via ORAL
  Filled 2022-11-01 (×6): qty 4

## 2022-11-01 MED ORDER — VANCOMYCIN HCL 750 MG/150ML IV SOLN
750.0000 mg | Freq: Two times a day (BID) | INTRAVENOUS | Status: DC
  Administered 2022-11-02 – 2022-11-04 (×5): 750 mg via INTRAVENOUS
  Filled 2022-11-01 (×5): qty 150

## 2022-11-01 MED ORDER — HYDROCOD POLI-CHLORPHE POLI ER 10-8 MG/5ML PO SUER
5.0000 mL | Freq: Two times a day (BID) | ORAL | Status: DC | PRN
  Administered 2022-11-01: 5 mL via ORAL
  Filled 2022-11-01 (×2): qty 5

## 2022-11-01 MED ORDER — INSULIN GLARGINE-YFGN 100 UNIT/ML ~~LOC~~ SOLN: 28.0000 [IU] | Freq: Every day | SUBCUTANEOUS | Status: DC

## 2022-11-01 NOTE — Progress Notes (Signed)
[facility] Progress Note  Patient: [name]    ZOX:[id number]  DOA: 10/21/2022     Date of Service: the patient was seen and examined on 11/01/2022  No chief complaint on file.  Brief hospital course: Patient is a 76 year old male with past medical history of type 1 diabetes mellitus on insulin pump, history of left leg DVT on Eliquis, CAD status post angioplasty, bladder cancer and previous history of MRSA infection who presented to the emergency room on 11/22 and was admitted to the hospitalist service for recurrent septic arthritis of left knee.     Patient underwent irrigation debridement and partial meniscectomy on 11/23 with repeat irrigation debridement on 11/27 and was placed on IV daptomycin for 6 weeks.  He was discharged on 12/4 to skilled nursing.     Patient was seen in the orthopedic office on the morning of 12/11 for suture removal and noted to have purulent drainage coming from the wound.  Orthopedics  took patient to the OR on 12/11 afternoon for repeat arthroscopic debridement and hospitalists were contacted for admission. Hemovac drain removed 10/23/22, possible need for additional washout on 10/24/22. Cultures showing few staph aureus.  Gram stain with rare gram + cocci in singles - pending susceptibilities as of 12/15 AM.  Patient is currently on Daptomycin. Culture came back as (+)MRSA, given lack of substantial improvement, ID would like to change abx and keep him here through the weekend. SNF rehab plan for Monday if doing well.    Evening 12/15 elevated temp and cough, ordered CXR and repeat BCx and UA though primary concern is for knee. 12/16 reviewed results - CXR concerning for interstitial edema/pneumonia worse on R, remains w/ fever, SpO2 documented as 70s on RA asked RN team to reassess and confirm if on O2. AM labs show normal WBC, Hgb to 7.6. UA not collected. Asked ID on call (Dr Linus Salmons) to review abx and determine if any changes needed - he  confirmed ok to continue w/ current plan. UA did return w/ large Hgb, no leuks/WBC but showing >50 WBC/HPF on micorscopy - await culture results. Serum WBC normal - not meeting sepsis criteria. Hypoxic on room air improved on [place]. 12/17 CT neg PE, did show bronchial thickening w/ mucoid impaction in lower lobes c/w infectious vs inflammatory bronchitis, mild emphysema, reflux IV contrast in the hepatic veins suggestive of increased right heart pressure. Incentive spirometry and another dose lasix po ordered for today but held d/t soft BP. Remains afebrile and denies SOB 12/17-12/18, but tested (+)influenza, started tamiflu. SNF placement delayed also d/t cost of abx, ID following for alternative, awaiting send-out labs for MIC on daptomycin.        Consultants:  Orthopedic surgery Infectious disease    Procedures: 10/21/22 Arthroscopic irrigation debridement of left knee (Dr [name] - ortho surgery)     Assessment and Plan: Principal Problem:   Septic arthritis of knee, left ([place]) Active Problems:   Chronic systolic CHF (congestive heart failure) ([place])   Uncontrolled type 1 diabetes mellitus with hyperglycemia, with long-term current use of insulin ([name])   DVT (deep venous thrombosis) ([name])   HYPERTENSION, BENIGN   Chronic constipation   Overweight (BMI 25.0-29.9)     Septic arthritis of knee, left ([place]) MRSA Appreciate orthopedic intervention and ID guidance.   Status post repeat arthroscopic debridement 10/21/22.   Continue IV antibiotics per ID --> added ceftaroline to daptomycin 10/25/22 but this Rx is prohibitively expensive to continue, awaiting MIC for daptomycin  which was a send-out lab, ID following  Once abx plan in place, anticipate will be appropriate for discharge pending any new developments  12/22 still awaiting for MIC to daptomycin as per ID  Influenza A Fever - resolved Tamiflu  RN was advised to wean oxygen to room air. As per TOC patient cannot go to rehab for 7  days due to flu virus infection  12/22 started Mucinex 600 mg p.o. twice daily, Tussionex as needed cough   Acute hypoxic respiratory failure - improved  D/t mild CHF exacerbation, Influenza  Supplemental O2 as needed Intermittent diuresis - caution w/ soft BP   Chronic systolic CHF (congestive heart failure) ([place]) At patient's last hospitalization, 2D echo and TEE confirmed ejection fraction of 35 to 40%.  Lasix daily - extra dose prn    Uncontrolled type 1 diabetes mellitus with hyperglycemia, with long-term current use of insulin ([name]) Resume home medications and sliding scale    DVT (deep venous thrombosis) ([name]) on Eliquis.     Essential hypertension w/ soft BP here  Stable Will resume home medications when blood pressure starts to rise   Chronic constipation Will start Colace and lactulose.   Overweight (BMI 25.0-29.9) Meets criteria BMI greater than 25  ? H/o complicated UTI due to MRSA in May 2023 HE has urethral stricture and gets frequent dilatation Also has bladder diverticulum This may put him ar risk for MRSA colonization of urine   H/o ca bladder Follow-up with urologist and oncology as an outpatient.  Body mass index is 28.06 kg/m.   Interventions:   Pressure Injury 10/14/22 Heel Right Deep Tissue Pressure Injury - Purple or maroon localized area of discolored intact skin or blood-filled blister due to damage of underlying soft tissue from pressure and/or shear. purple discolored intact skin (Active)  10/14/22 0830  Location: Heel  Location Orientation: Right  Staging: Deep Tissue Pressure Injury - Purple or maroon localized area of discolored intact skin or blood-filled blister due to damage of underlying soft tissue from pressure and/or shear.  Wound Description (Comments): purple discolored intact skin  Present on Admission: No  Dressing Type Compression wrap 10/31/22 2230     Pressure Injury 10/14/22 Heel Left Deep Tissue Pressure Injury - Purple  or maroon localized area of discolored intact skin or blood-filled blister due to damage of underlying soft tissue from pressure and/or shear. purple discolored intact skin (Active)  10/14/22 0830  Location: Heel  Location Orientation: Left  Staging: Deep Tissue Pressure Injury - Purple or maroon localized area of discolored intact skin or blood-filled blister due to damage of underlying soft tissue from pressure and/or shear.  Wound Description (Comments): purple discolored intact skin  Present on Admission: No  Dressing Type Compression wrap 10/31/22 2230     Diet: Heart healthy/carb modified DVT Prophylaxis: Therapeutic Anticoagulation with Eliquis    Advance goals of care discussion: Full code  Family Communication: family was NOT present at bedside, at the time of interview.  The pt provided permission to discuss medical plan with the family. Opportunity was given to ask question and all questions were answered satisfactorily.   Disposition:  Pt is from SNF, admitted with left knee septic arthritis, still has infection, on IV antibiotics, awaiting for MIC to daptomycin, which precludes a safe discharge. Discharge to SNF, when cleared by ID most likely in 1 to 2 days.  Subjective: No significant events overnight, patient was complaining of some cough and congestion, prescribed Mucinex.  Patient stated that his  pain is well-controlled now.  No any other active issues.   Physical Exam: General:  alert oriented to time, place, and person.  Appear in no distress, affect appropriate Eyes: PERRLA ENT: Oral Mucosa Clear, moist  Neck: no JVD,  Cardiovascular: S1 and S2 Present, no Murmur,  Respiratory: good respiratory effort, Bilateral Air entry equal and Decreased, no Crackles, no wheezes Abdomen: Bowel Sound present, Soft and no tenderness,  Skin: no rashes Extremities: no Pedal edema, no calf tenderness, Left Knee mild swelling, dressing CDI Neurologic: without any new focal  findings Gait not checked due to patient safety concerns  Vitals:   10/31/22 0735 10/31/22 1614 10/31/22 2305 11/01/22 0755  BP: 138/64 (!) 105/59 (!) 112/55 (!) 144/65  Pulse: 63 68 65 66  Resp: '16 16 20 17  '$ Temp: 98.4 F (36.9 C) 97.8 F (36.6 C) 98.3 F (36.8 C) 98.1 F (36.7 C)  TempSrc:      SpO2: 94% 98% 92% 92%  Weight:      Height:        Intake/Output Summary (Last 24 hours) at 11/01/2022 1324 Last data filed at 11/01/2022 1302 Gross per 24 hour  Intake 240 ml  Output 500 ml  Net -260 ml   Filed Weights   10/21/22 1454  Weight: 86.2 kg    Data Reviewed: I have personally reviewed and interpreted daily labs, tele strips, imagings as discussed above. I reviewed all nursing notes, pharmacy notes, vitals, pertinent old records I have discussed plan of care as described above with RN and patient/family.  CBC: Recent Labs  Lab 10/28/22 0550 10/28/22 1324 10/29/22 0339 10/30/22 0353 10/31/22 0500  WBC 8.1 7.5 8.3 8.4 8.8  NEUTROABS  --  4.7  --   --   --   HGB 8.6* 8.9* 9.2* 8.7* 8.6*  HCT 26.9* 27.8* 28.7* 26.8* 26.9*  MCV 89.4 89.7 88.9 88.7 88.5  PLT 379 384 404* 397 735*   Basic Metabolic Panel: Recent Labs  Lab 10/26/22 0530 10/28/22 0550 10/30/22 0353 10/31/22 0500  NA 140 140 141 142  K 3.6 3.5 3.4* 3.3*  CL 101 98 98 101  CO2 34* 37* 38* 35*  GLUCOSE 110* 114* 133* 103*  BUN '20 21 22 20  '$ CREATININE 1.10 1.09 1.11 1.08  CALCIUM 7.9* 8.2* 7.7* 7.8*  MG  --   --   --  2.1  PHOS  --   --   --  2.6    Studies: No results found.  Scheduled Meds:  apixaban  5 mg Oral BID   aspirin EC  81 mg Oral Daily   atorvastatin  40 mg Oral Daily   carvedilol  3.125 mg Oral BID WC   Chlorhexidine Gluconate Cloth  6 each Topical Daily   docusate sodium  100 mg Oral BID   furosemide  40 mg Oral Daily   insulin aspart  0-5 Units Subcutaneous QHS   insulin aspart  0-9 Units Subcutaneous TID WC   [START ON 11/02/2022] insulin glargine-yfgn  22 Units  Subcutaneous Daily   lactulose  20 g Oral Daily   multivitamin with minerals  1 tablet Oral Daily   oseltamivir  75 mg Oral BID   pantoprazole  40 mg Oral Daily   sodium chloride flush  10-40 mL Intracatheter Q12H   Continuous Infusions:  ceFTAROline (TEFLARO) IV 600 mg (11/01/22 0657)   promethazine (PHENERGAN) injection (IM or IVPB)     PRN Meds: acetaminophen **OR** acetaminophen, diphenhydrAMINE, guaiFENesin-codeine, HYDROmorphone (  DILAUDID) injection, hyoscyamine, ondansetron **OR** ondansetron (ZOFRAN) IV, mouth rinse, oxyCODONE, promethazine (PHENERGAN) injection (IM or IVPB), sodium chloride flush, Uribel  Time spent: 35 minutes  Author: Val Riles. MD [facility] 11/01/2022 1:24 PM  To reach On-call, see care teams to locate the attending and reach out to them via [email or web address]. If 7PM-7AM, please contact night-coverage If you still have difficulty reaching the attending provider, please page the [facility] (Director on Call) for [facility] on amion for assistance.

## 2022-11-01 NOTE — TOC Progression Note (Signed)
Transition of Care [facility]) - Progression Note    Patient Details  Name: [name] MRN: [id number] Date of Birth: [birth year]  Transition of Care [facility]) CM/SW [place], RN Phone Number: 11/01/2022, 3:54 PM  Clinical Narrative:    Spoke with the patient and explained LTAC, He said he really wants to go to [facility] to [facility], I explained to him that once of the antibiotics that he is on is not feesible to do at [facility] and it could mean that he would be in the hospital longer, he would like to speak to pharmacy of the doctor about what that looks like and time frame, I notified both        Expected Discharge Plan and Services                                               Social Determinants of Health (SDOH) Interventions [place]: No Food Insecurity (10/21/2022)  Housing: Low Risk  (10/21/2022)  Transportation Needs: No Transportation Needs (10/21/2022)  Utilities: Not At Risk (10/21/2022)  Tobacco Use: Medium Risk (10/23/2022)    Readmission Risk Interventions     No data to display

## 2022-11-01 NOTE — Progress Notes (Signed)
OT Cancellation Note  Patient Details Name: [name] MRN: [id number] DOB: 07/12/1946   Cancelled Treatment:    Reason Eval/Treat Not Completed: Medical issues which prohibited therapy. RN reporting recent hypoglycemic event an d pt not feeling well, requests to hold off this AM, will re-attempt as able.   [name], M.S. OTR/L  11/01/22, 12:55 PM  ascom [phone]

## 2022-11-01 NOTE — Plan of Care (Signed)

## 2022-11-01 NOTE — TOC Progression Note (Signed)
Transition of Care [facility]) - Progression Note    Patient Details  Name: [name] MRN: [id number] Date of Birth: [birth year]  Transition of Care [facility]) CM/SW [place], RN Phone Number: 11/01/2022, 9:40 AM  Clinical Narrative:     THE patient is to go to [facility] at [place], They called and stated that ceftaroline is very expensive and the facility will not be able to do that med when he discharges, will need to be changed to something else , I notified the physician and the ID Pharmacists        Expected Discharge Plan and Services                                               Social Determinants of Health ([place]) Interventions [place]: No Food Insecurity (10/21/2022)  Housing: Low Risk  (10/21/2022)  Transportation Needs: No Transportation Needs (10/21/2022)  Utilities: Not At Risk (10/21/2022)  Tobacco Use: Medium Risk (10/23/2022)    Readmission Risk Interventions     No data to display

## 2022-11-01 NOTE — Progress Notes (Signed)
Date of Admission:  10/21/2022      ID: [name] is a 76 y.o. male Principal Problem:   Septic arthritis of knee, left ([place]) Active Problems:   HYPERTENSION, BENIGN   DVT (deep venous thrombosis) ([place])   Uncontrolled type 1 diabetes mellitus with hyperglycemia, with long-term current use of insulin ([name])   Chronic systolic CHF (congestive heart failure) ([name])   Overweight (BMI 25.0-29.9)   Chronic constipation   Influenza A  Partner at bed side  Subjective: Feeling better  No fever Cough present Left knee pain better Overall feeling better  Medications:   apixaban  5 mg Oral BID   aspirin EC  81 mg Oral Daily   atorvastatin  40 mg Oral Daily   carvedilol  3.125 mg Oral BID WC   Chlorhexidine Gluconate Cloth  6 each Topical Daily   docusate sodium  100 mg Oral BID   furosemide  40 mg Oral Daily   insulin aspart  0-5 Units Subcutaneous QHS   insulin aspart  0-9 Units Subcutaneous TID WC   insulin glargine-yfgn  11 Units Subcutaneous Once   [START ON 11/02/2022] insulin glargine-yfgn  22 Units Subcutaneous Daily   lactulose  20 g Oral Daily   multivitamin with minerals  1 tablet Oral Daily   oseltamivir  75 mg Oral BID   pantoprazole  40 mg Oral Daily   polyethylene glycol  17 g Oral Once   senna-docusate  2 tablet Oral Once   sodium chloride flush  10-40 mL Intracatheter Q12H    Objective: Vital signs in last 24 hours: Temp:  [97.8 F (36.6 C)-98.3 F (36.8 C)] 98.1 F (36.7 C) (12/22 0755) Pulse Rate:  [65-68] 66 (12/22 0755) Resp:  [16-20] 17 (12/22 0755) BP: (105-144)/(55-65) 144/65 (12/22 0755) SpO2:  [92 %-98 %] 92 % (12/22 0755)  LDA Rt PICC  PHYSICAL EXAM:  General: Alert, cooperative, no distress at rest Lungs: b/l air entry Heart: Regular rate and rhythm, no murmur, rub or gallop. Abdomen: Soft, non-tender,not distended. Bowel sounds normal. No masses Extremities: left knee surgical site covered with dressing-  B/l heel pressure  injury- covered with dressing Skin: No rashes or lesions. Or bruising Lymph: Cervical, supraclavicular normal. Neurologic: Grossly non-focal  Lab Results Recent Labs    10/30/22 0353 10/31/22 0500  WBC 8.4 8.8  HGB 8.7* 8.6*  HCT 26.8* 26.9*  NA 141 142  K 3.4* 3.3*  CL 98 101  CO2 38* 35*  BUN 22 20  CREATININE 1.11 1.08    Microbiology:  10/03/22 2 sets blood culture MRSA 10/03/22 - left knee - MRSA 10/05/22 BC X 2 - NG 10/07/22 Synovial fluid MRSA 10/21/22 Synovium MRSA Vanco MIC 2 10/25/22 BC- NG  Daptomycin MIC 3 ( resistant) Ceftaroline < 1 susceptible   Assessment/Plan:  MRSA bacteremia 10/02/22 Repeat BC 112/5/23 NG TEE 10/07/22 No endocarditis Has been on daptomycin  which is now resistant    Left knee septic arthritis due to MRSA 11/22 washout MRSA 10/07/22 washout MRSA 10/21/22 washout MRSA-- the vancomycin MIC has increased to 2 from 1 before .  Added Ceftaroline  as dual MRSA coverage on 10/25/22 Daptomycin MIC was sent to [facility] and reported today ( 11/01/22) as 3 which is resisatnt So will DC dapto and start vanco along with ceftaroline Unable to use rifampin instead because he is on eliquis HE is going to need 4-6 weeks of IV antibiotic from his last washout - 12/01/22- will er ron  the longer side because of dapto resistance Will Continue dual MRSA coverage   Influenza A resp illness -on Tamiflu- will complete 5 days   DVT- Non occlusive clot left peroneal vein on 11/22- on eliquis   Anemia He has dizziness on standing- could be postural orthostasis ? Need for PRBC ? H/o complicated UTI due to MRSA in May 2023 HE has urethral stricture and gets frequent dilatation Also has bladder diverticulum This may put him ar risk for MRSA colonization of urine   H/o ca bladder  SNF will not take him on ceftaroline So either he goes to [facility] or home ( when ready)  Discussed the management with patient and his partner

## 2022-11-01 NOTE — Progress Notes (Signed)
Hypoglycemic Event  CBG: 54  Treatment: 8 ounces orange juice  Symptoms: none  Follow-up CBG: [id number] CBG Result:73  Possible Reasons for Event: unknown  Comments/MD notified:Dr Kumar  Pt was given 4 ounces of juice at 0815 results was 65.  At [phone] 4 more ounces juice given result was [place]

## 2022-11-01 NOTE — Progress Notes (Signed)
Pharmacy Antibiotic Note  [name] is a 76 y.o. male admitted on 10/21/2022 with septic right knee and MRSA bacteremia discharged on Daptomycin but re-admitted 12/11 as pus seen at suture line at Orthopedic follow-up appointment.  Pharmacy has been consulted for vancomycin dosing.  The susceptibility testing for daptomycin against the latest MRSA knee culture from 12/11 showed MIC = 3 (resistant)  Today, 11/01/2022 Day 8 ceftarroline Day 1  vancomycin (stopping daptomycin d/t S aureus being resistant) 12/19 CK = 21 (stable) Renal function stable on most recent labs WBC WNL on most recent labs 12/18 influenza A positive 12/15 started ceftaroline and sent out daptomycin MIC to ensure susceptible.  Sent to [facility] - awaiting results  Plan: start vancomycin '1500mg'$  IV x 1 now then '750mg'$  IV q12h Goal AUC 400-600 Estimated AUC = 452 using SCr 1.08 (trough = 14.2) Monitor renal function closely Check vancomycin levels at steady state (12/24pm vs 12/25) Continue ceftaroline '600mg'$  q8h ID following Increase atorvastatin back to '80mg'$ now that off daptomycin    Height: '5\' 9"'$  (175.3 cm) Weight: 86.2 kg (190 lb) IBW/kg (Calculated) : 70.7  Temp (24hrs), Avg:98.1 F (36.7 C), Min:97.8 F (36.6 C), Max:98.3 F (36.8 C)  Recent Labs  Lab 10/26/22 0530 10/28/22 0550 10/28/22 1324 10/29/22 0339 10/30/22 0353 10/31/22 0500  WBC 8.5 8.1 7.5 8.3 8.4 8.8  CREATININE 1.10 1.09  --   --  1.11 1.08     Estimated Creatinine Clearance: 64.3 mL/min (by C-G formula based on SCr of 1.08 mg/dL).    No Known Allergies  Antimicrobials this admission: 11/25 daptomycin >> 12/22 12/15 ceftaroline >> 12/22 vancomycin >>  Microbiology results: 11/23 BCx >> 4/4 GPC (BCID MRSA) 11/24 L-knee (OR cx) >> abundant staph aureus 12/11 WCx: MRSA  Thank you for allowing pharmacy to be a part of this patient's care.  Doreene Eland, PharmD, BCPS, BCIDP Work Cell: [phone] 11/01/2022 3:52  PM

## 2022-11-01 NOTE — Progress Notes (Signed)
Physical Therapy Treatment Patient Details Name: [name] MRN: [id number] DOB: September 07, 1946 Today's Date: 11/01/2022   History of Present Illness Patient here septic left knee, s/p I & D. WBAT. Pt. has had multiple recent similar admissions.    PT Comments    Pt was long sitting in bed upon arriving. He is alert but only oriented x 2. Uses humor at times to cover cognition deficits. Pt was agreeable and eager to attempting OOB activity however severely limited by dizziness/ near syncopal episode upon sitting up. Unable to get accurate reliable BP in sitting due concerns of  syncopal episode. Took several minutes once returned to supine for dizziness to be less severe and pt's cognition to return to prior to attempting to get up. Care team made aware. MD to order IV fluids. PT will continue efforts to progress as able per current POC. SNF remains most appropriate DC disposition.     Recommendations for follow up therapy are one component of a multi-disciplinary discharge planning process, led by the attending physician.  Recommendations may be updated based on patient status, additional functional criteria and insurance authorization.  Follow Up Recommendations  Skilled nursing-short term rehab (<3 hours/day)     Assistance Recommended at Discharge Intermittent Supervision/Assistance  Patient can return home with the following A little help with walking and/or transfers;Assistance with cooking/housework;Assist for transportation;Help with stairs or ramp for entrance;A little help with bathing/dressing/bathroom;Direct supervision/assist for medications management   Equipment Recommendations  None recommended by PT       Precautions / Restrictions Precautions Precautions: Fall Precaution Comments: orthostatic/ Dizziness/ altered mental status with mobility Restrictions Weight Bearing Restrictions: Yes LLE Weight Bearing: Weight bearing as tolerated     Mobility  Bed  Mobility Overal bed mobility: Needs Assistance Bed Mobility: Supine to Sit, Sit to Supine  Supine to sit: Supervision Sit to supine: Supervision   General bed mobility comments: BP prior to activity 122/72. Upon sitting up, pt get extremely dizzy  + has change is alertness. Author concerned for syncopal episode and assisted pt back into supine. dizziness  improves with prolong recovery time. Pt's face is more pale than observed previously by author in past sessions.    Transfers  General transfer comment: unable due to concern of syncopal episode. Author questions if due to lack of intake (hydration) or if due to medicine/ antibiotics. Pt did perform ther ex in bed and tolerated well. see exercises listed below.     Balance Overall balance assessment: Needs assistance Sitting-balance support: Feet supported Sitting balance-Leahy Scale: Good     Standing balance support: Bilateral upper extremity supported, During functional activity Standing balance-Leahy Scale: Fair       Cognition Arousal/Alertness: Awake/alert Behavior During Therapy: WFL for tasks assessed/performed Overall Cognitive Status: Within Functional Limits for tasks assessed Area of Impairment: Safety/judgement, Problem solving   Current Attention Level: Focused   Following Commands: Follows one step commands consistently, Follows one step commands with increased time Safety/Judgement: Decreased awareness of safety, Decreased awareness of deficits Awareness: Intellectual Problem Solving: Slow processing, Difficulty sequencing, Requires verbal cues General Comments: Pt is alert and cooperative but remains disoriented overall. Oriented to self but disoriented to day, overall situation, and not at baseline cognition.        Exercises General Exercises - Lower Extremity Ankle Circles/Pumps: AROM, 20 reps Quad Sets: AROM, 10 reps Gluteal Sets: 10 reps Heel Slides: AROM, 10 reps Hip ABduction/ADduction: AROM, 10  reps Straight Leg Raises: AAROM, 10 reps  Pertinent Vitals/Pain Pain Assessment Pain Assessment: 0-10 Pain Score: 4  Faces Pain Scale: Hurts a little bit Pain Location: L knee Pain Descriptors / Indicators: Discomfort, Sore Pain Intervention(s): Limited activity within patient's tolerance, Monitored during session, Premedicated before session, Repositioned     PT Goals (current goals can now be found in the care plan section) Acute Rehab PT Goals Patient Stated Goal: go home when able Progress towards PT goals: Progressing toward goals    Frequency    7X/week      PT Plan Current plan remains appropriate    Co-evaluation     PT goals addressed during session: Mobility/safety with mobility;Balance;Proper use of DME;Strengthening/ROM        AM-PAC PT "6 Clicks" Mobility   Outcome Measure  Help needed turning from your back to your side while in a flat bed without using bedrails?: None Help needed moving from lying on your back to sitting on the side of a flat bed without using bedrails?: None Help needed moving to and from a bed to a chair (including a wheelchair)?: A Little Help needed standing up from a chair using your arms (e.g., wheelchair or bedside chair)?: A Little Help needed to walk in hospital room?: A Little Help needed climbing 3-5 steps with a railing? : A Lot 6 Click Score: 19    End of Session   Activity Tolerance: Patient limited by fatigue;Treatment limited secondary to medical complications (Comment) (limited by c/o severe dizziness. no formal orthostatic BP observed however pt near syncopal upon sitting up EOB.) Patient left: in bed;with bed alarm set;with call bell/phone within reach Nurse Communication: Mobility status PT Visit Diagnosis: Muscle weakness (generalized) (M62.81);Difficulty in walking, not elsewhere classified (R26.2);Pain Pain - Right/Left: Left Pain - part of body: Knee     Time: 3762-8315 PT Time Calculation (min)  (ACUTE ONLY): 17 min  Charges:  $Therapeutic Activity: 8-22 mins                     Julaine Fusi PTA 11/01/22, 2:31 PM

## 2022-11-02 DIAGNOSIS — M00062 Staphylococcal arthritis, left knee: Secondary | ICD-10-CM | POA: Diagnosis not present

## 2022-11-02 LAB — BASIC METABOLIC PANEL
Anion gap: 8 (ref 5–15)
BUN: 17 mg/dL (ref 8–23)
CO2: 30 mmol/L (ref 22–32)
Calcium: 8.1 mg/dL — ABNORMAL LOW (ref 8.9–10.3)
Chloride: 103 mmol/L (ref 98–111)
Creatinine, Ser: 0.96 mg/dL (ref 0.61–1.24)
GFR, Estimated: >60 mL/min (ref >60)
Glucose, Bld: 109 mg/dL — ABNORMAL HIGH (ref 70–99)
Potassium: 3.9 mmol/L (ref 3.5–5.1)
Sodium: 141 mmol/L (ref 135–145)

## 2022-11-02 LAB — CBC
HCT: 29.5 % — ABNORMAL LOW (ref 39.0–52.0)
Hemoglobin: 9.5 g/dL — ABNORMAL LOW (ref 13.0–17.0)
MCH: 28.5 pg (ref 26.0–34.0)
MCHC: 32.2 g/dL (ref 30.0–36.0)
MCV: 88.6 fL (ref 80.0–100.0)
Platelets: 399 10*3/uL (ref 150–400)
RBC: 3.33 MIL/uL — ABNORMAL LOW (ref 4.22–5.81)
RDW: 15.5 % (ref 11.5–15.5)
WBC: 10.5 10*3/uL (ref 4.0–10.5)
nRBC: 0 % (ref 0.0–0.2)

## 2022-11-02 LAB — PHOSPHORUS: Phosphorus: 2.6 mg/dL (ref 2.5–4.6)

## 2022-11-02 LAB — GLUCOSE, CAPILLARY
Glucose-Capillary: 107 mg/dL — ABNORMAL HIGH (ref 70–99)
Glucose-Capillary: 234 mg/dL — ABNORMAL HIGH (ref 70–99)
Glucose-Capillary: 264 mg/dL — ABNORMAL HIGH (ref 70–99)
Glucose-Capillary: 200 mg/dL — ABNORMAL HIGH (ref 70–99)

## 2022-11-02 LAB — MAGNESIUM: Magnesium: 2.2 mg/dL (ref 1.7–2.4)

## 2022-11-02 MED ORDER — GUAIFENESIN-DM 100-10 MG/5ML PO SYRP
10.0000 mL | ORAL_SOLUTION | Freq: Four times a day (QID) | ORAL | Status: DC | PRN
  Administered 2022-11-02 – 2022-11-04 (×4): 10 mL via ORAL
  Filled 2022-11-02 (×4): qty 10

## 2022-11-02 NOTE — Progress Notes (Signed)
Dressing change to bilateral heels/feet completed per orders.

## 2022-11-02 NOTE — Progress Notes (Signed)
Physical Therapy Treatment Patient Details Name: [name] MRN: [id number] DOB: 24-Jan-1946 Today's Date: 11/02/2022   History of Present Illness Patient here septic left knee, s/p I & D. WBAT. Pt. has had multiple recent similar admissions.    PT Comments    Pt was long sitting in bed upon arrival. He is much more alert and energetic than previous date. Seems to have slightly improved cognition overall too. He agrees to OOB activity. BP in bed 141/77, sitting 138/62, after ambulation/standing > 2 minutes without symptoms 138/67. No reports of dizziness throughout session today. He does require assistance to stand from lower recliner surface but overall is progressing well. Increased time required to exit bed but did without assistance. Stood to [employer] and ambulated ~ 25 ft in room. Overall tolerated session well. He did endorse new care plan of [place] home with [facility] services since rehab will not take him on current antibiotics. Author does recommend HHPT if he decides to DC directly home to maximize his strength and independence at DC. Acute PT will continue to follow per current POC.   Recommendations for follow up therapy are one component of a multi-disciplinary discharge planning process, led by the attending physician.  Recommendations may be updated based on patient status, additional functional criteria and insurance authorization.  Follow Up Recommendations  Follow physician's recommendations for discharge plan and follow up therapies (pt reports that he is now planning to DC home with [facility] services. " I can do the antibiotics at home and will safe me alot of money." Pt did demonstarte improved activity tolerance without symptoms of dizziness)     Assistance Recommended at Discharge Set up Supervision/Assistance  Patient can return home with the following A little help with walking and/or transfers;Assistance with cooking/housework;Assist for transportation;Help with stairs or ramp for  entrance;A little help with bathing/dressing/bathroom;Direct supervision/assist for medications management   Equipment Recommendations  None recommended by PT       Precautions / Restrictions Precautions Precautions: Fall Precaution Comments: orthostatic/ Dizziness Restrictions Weight Bearing Restrictions: Yes LLE Weight Bearing: Weight bearing as tolerated     Mobility  Bed Mobility Overal bed mobility: Needs Assistance Bed Mobility: Supine to Sit  Supine to sit: Supervision  General bed mobility comments: no physical assistance. Did require increased time but was able to perform without physical assistance    Transfers Overall transfer level: Needs assistance Equipment used: Rolling walker (2 wheels) Transfers: Sit to/from Stand Sit to Stand: Min guard, Min assist (min assist from recliner but CGA from slightly elevated bed height)    General transfer comment: Pt was able to stand EOB with bed height only minimally elevated with CGA. min assist form lower recliner surface.    Ambulation/Gait Ambulation/Gait assistance: Supervision Gait Distance (Feet): 25 Feet Assistive device: Rolling walker (2 wheels) Gait Pattern/deviations: Step-through pattern, Antalgic Gait velocity: decreased     General Gait Details: no dizziness or LOB during ambulation. limited distance 2/2 to pt not being OOB in several days + history of syncopal episodes    Balance Overall balance assessment: Needs assistance Sitting-balance support: Feet supported Sitting balance-Leahy Scale: Good     Standing balance support: Bilateral upper extremity supported, During functional activity Standing balance-Leahy Scale: Fair Standing balance comment: reliant on RW during dynamic standing activity     Cognition Arousal/Alertness: Awake/alert Behavior During Therapy: WFL for tasks assessed/performed Overall Cognitive Status: Within Functional Limits for tasks assessed      Following Commands:  Follows one step commands with  increased time       General Comments: Pt is alert and much more energetic           General Comments General comments (skin integrity, edema, etc.): reviewed importance of knee flex/ext exercises to promote improved painfree AROM      Pertinent Vitals/Pain Pain Assessment Pain Assessment: 0-10 Pain Score: 2  Pain Location: L knee Pain Descriptors / Indicators: Discomfort, Sore Pain Intervention(s): Limited activity within patient's tolerance, Monitored during session, Premedicated before session, Repositioned     PT Goals (current goals can now be found in the care plan section) Acute Rehab PT Goals Patient Stated Goal: go home when able Progress towards PT goals: Progressing toward goals    Frequency    7X/week      PT Plan Current plan remains appropriate    Co-evaluation     PT goals addressed during session: Mobility/safety with mobility;Balance;Proper use of DME;Strengthening/ROM        AM-PAC PT "6 Clicks" Mobility   Outcome Measure  Help needed turning from your back to your side while in a flat bed without using bedrails?: None Help needed moving from lying on your back to sitting on the side of a flat bed without using bedrails?: A Little Help needed moving to and from a bed to a chair (including a wheelchair)?: A Little Help needed standing up from a chair using your arms (e.g., wheelchair or bedside chair)?: A Little Help needed to walk in hospital room?: A Little Help needed climbing 3-5 steps with a railing? : A Lot 6 Click Score: 18    End of Session Equipment Utilized During Treatment: Oxygen (2L sao2 >89%) Activity Tolerance: Patient tolerated treatment well;Patient limited by fatigue Patient left: in chair;with call bell/phone within reach;with chair alarm set Nurse Communication: Mobility status PT Visit Diagnosis: Muscle weakness (generalized) (M62.81);Difficulty in walking, not elsewhere classified  (R26.2);Pain Pain - Right/Left: Left Pain - part of body: Knee     Time: 3329-5188 PT Time Calculation (min) (ACUTE ONLY): 24 min  Charges:  $Gait Training: 8-22 mins $Therapeutic Activity: 8-22 mins                     Julaine Fusi PTA 11/02/22, 12:24 PM

## 2022-11-02 NOTE — Plan of Care (Signed)

## 2022-11-02 NOTE — Progress Notes (Signed)
[facility] Progress Note  Patient: [name]    ZHY:[id number]  DOA: 10/21/2022     Date of Service: the patient was seen and examined on 11/02/2022  No chief complaint on file.  Brief hospital course: Patient is a 76 year old male with past medical history of type 1 diabetes mellitus on insulin pump, history of left leg DVT on Eliquis, CAD status post angioplasty, bladder cancer and previous history of MRSA infection who presented to the emergency room on 11/22 and was admitted to the hospitalist service for recurrent septic arthritis of left knee.     Patient underwent irrigation debridement and partial meniscectomy on 11/23 with repeat irrigation debridement on 11/27 and was placed on IV daptomycin for 6 weeks.  He was discharged on 12/4 to skilled nursing.     Patient was seen in the orthopedic office on the morning of 12/11 for suture removal and noted to have purulent drainage coming from the wound.  Orthopedics  took patient to the OR on 12/11 afternoon for repeat arthroscopic debridement and hospitalists were contacted for admission. Hemovac drain removed 10/23/22, possible need for additional washout on 10/24/22. Cultures showing few staph aureus.  Gram stain with rare gram + cocci in singles - pending susceptibilities as of 12/15 AM.  Patient is currently on Daptomycin. Culture came back as (+)MRSA, given lack of substantial improvement, ID would like to change abx and keep him here through the weekend. SNF rehab plan for Monday if doing well.    Evening 12/15 elevated temp and cough, ordered CXR and repeat BCx and UA though primary concern is for knee. 12/16 reviewed results - CXR concerning for interstitial edema/pneumonia worse on R, remains w/ fever, SpO2 documented as 70s on RA asked RN team to reassess and confirm if on O2. AM labs show normal WBC, Hgb to 7.6. UA not collected. Asked ID on call (Dr Linus Salmons) to review abx and determine if any changes needed - he  confirmed ok to continue w/ current plan. UA did return w/ large Hgb, no leuks/WBC but showing >50 WBC/HPF on micorscopy - await culture results. Serum WBC normal - not meeting sepsis criteria. Hypoxic on room air improved on [place]. 12/17 CT neg PE, did show bronchial thickening w/ mucoid impaction in lower lobes c/w infectious vs inflammatory bronchitis, mild emphysema, reflux IV contrast in the hepatic veins suggestive of increased right heart pressure. Incentive spirometry and another dose lasix po ordered for today but held d/t soft BP. Remains afebrile and denies SOB 12/17-12/18, but tested (+)influenza, started tamiflu. SNF placement delayed also d/t cost of abx, ID following for alternative, awaiting send-out labs for MIC on daptomycin.        Consultants:  Orthopedic surgery Infectious disease    Procedures: 10/21/22 Arthroscopic irrigation debridement of left knee (Dr [name] - ortho surgery)     Assessment and Plan: Principal Problem:   Septic arthritis of knee, left ([place]) Active Problems:   Chronic systolic CHF (congestive heart failure) ([place])   Uncontrolled type 1 diabetes mellitus with hyperglycemia, with long-term current use of insulin ([name])   DVT (deep venous thrombosis) ([name])   HYPERTENSION, BENIGN   Chronic constipation   Overweight (BMI 25.0-29.9)     Septic arthritis of knee, left ([place]) MRSA Appreciate orthopedic intervention and ID guidance.   Status post repeat arthroscopic debridement 10/21/22.   Continue IV antibiotics per ID --> added ceftaroline to daptomycin 10/25/22  12/22 MIC shows resistance to daptomycin, ID aware, discontinued daptomycin and  started vancomycin, continue Teflaro Patient wants to go home with home health set up, we need to discuss with the [facility] for arrangement at home and vancomycin needs to be therapeutic range before he goes home.  Patient will need antibiotics for 4 weeks  Influenza A Fever - resolved S/p Tamiflu completed 5-day course on  12/23 RN was advised to wean oxygen to room air. As per TOC patient cannot go to rehab for 7 days due to flu virus infection  12/22 started Mucinex 600 mg p.o. twice daily, Tussionex as needed cough   Acute hypoxic respiratory failure - improved  D/t mild CHF exacerbation, Influenza  Supplemental O2 as needed Intermittent diuresis - caution w/ soft BP   Chronic systolic CHF (congestive heart failure) ([place]) At patient's last hospitalization, 2D echo and TEE confirmed ejection fraction of 35 to 40%.  Lasix daily - extra dose prn    Uncontrolled type 1 diabetes mellitus with hyperglycemia, with long-term current use of insulin ([name]) Resume home medications and sliding scale  12/22 decrease to Semglee 22 units daily due to hypoglycemia episodes Monitor CBG and titrate dose accordingly. Continue sliding scale.  DVT (deep venous thrombosis) ([name]) on Eliquis.     Essential hypertension w/ soft BP here  Stable Will resume home medications when blood pressure starts to rise   Chronic constipation Will start Colace and lactulose.   Overweight (BMI 25.0-29.9) Meets criteria BMI greater than 25  ? H/o complicated UTI due to MRSA in May 2023 HE has urethral stricture and gets frequent dilatation Also has bladder diverticulum This may put him ar risk for MRSA colonization of urine   H/o Ca bladder Follow-up with urologist and oncology as an outpatient.  Body mass index is 28.06 kg/m.   Interventions:   Pressure Injury 10/14/22 Heel Right Deep Tissue Pressure Injury - Purple or maroon localized area of discolored intact skin or blood-filled blister due to damage of underlying soft tissue from pressure and/or shear. purple discolored intact skin (Active)  10/14/22 0830  Location: Heel  Location Orientation: Right  Staging: Deep Tissue Pressure Injury - Purple or maroon localized area of discolored intact skin or blood-filled blister due to damage of underlying soft tissue from  pressure and/or shear.  Wound Description (Comments): purple discolored intact skin  Present on Admission: No  Dressing Type Compression wrap 11/02/22 0900     Pressure Injury 10/14/22 Heel Left Deep Tissue Pressure Injury - Purple or maroon localized area of discolored intact skin or blood-filled blister due to damage of underlying soft tissue from pressure and/or shear. purple discolored intact skin (Active)  10/14/22 0830  Location: Heel  Location Orientation: Left  Staging: Deep Tissue Pressure Injury - Purple or maroon localized area of discolored intact skin or blood-filled blister due to damage of underlying soft tissue from pressure and/or shear.  Wound Description (Comments): purple discolored intact skin  Present on Admission: No  Dressing Type Gauze (Comment);ABD;Other (Comment) 10/30/22 0412     Diet: Heart healthy/carb modified DVT Prophylaxis: Therapeutic Anticoagulation with Eliquis    Advance goals of care discussion: Full code  Family Communication: family was present at bedside, at the time of interview.  The pt provided permission to discuss medical plan with the family. Opportunity was given to ask question and all questions were answered satisfactorily.   Disposition:  Pt is from SNF, admitted with left knee septic arthritis, still has infection, on IV antibiotics, needs HH setup, cannot go to SNF due to cost of  antibiotics, which precludes a safe discharge. Discharge to Home with [facility] set up, when cleared by ID most likely in 1 to 2 days.  Subjective: No significant events overnight, patient still has chest congestion and mild cough with phlegm production.  Denies any worsening of shortness of breath.  Still requiring supplemental O2 nation where nasal cannula.  Pain is under control.  No nasal active issues. Patient would like to be discharged home with home health set up.  Patient does not want LTAC   Physical Exam: General:  alert oriented to time, place, and  person.  Appear in no distress, affect appropriate Eyes: PERRLA ENT: Oral Mucosa Clear, moist  Neck: no JVD,  Cardiovascular: S1 and S2 Present, no Murmur,  Respiratory: good respiratory effort, Bilateral Air entry equal and Decreased, no Crackles, no wheezes Abdomen: Bowel Sound present, Soft and no tenderness,  Skin: no rashes Extremities: no Pedal edema, no calf tenderness, Left Knee mild swelling, dressing CDI Neurologic: without any new focal findings Gait not checked due to patient safety concerns  Vitals:   11/01/22 0755 11/01/22 1618 11/02/22 0146 11/02/22 0913  BP: (!) 144/65 (!) 119/57 136/66 (!) 148/62  Pulse: 66 66 60 65  Resp: '17 17 16 18  '$ Temp: 98.1 F (36.7 C) 98.2 F (36.8 C) 98.1 F (36.7 C) 98 F (36.7 C)  TempSrc:  Oral    SpO2: 92%  93% 99%  Weight:      Height:        Intake/Output Summary (Last 24 hours) at 11/02/2022 1346 Last data filed at 11/02/2022 1110 Gross per 24 hour  Intake 873.27 ml  Output 1550 ml  Net -676.73 ml   Filed Weights   10/21/22 1454  Weight: 86.2 kg    Data Reviewed: I have personally reviewed and interpreted daily labs, tele strips, imagings as discussed above. I reviewed all nursing notes, pharmacy notes, vitals, pertinent old records I have discussed plan of care as described above with RN and patient/family.  CBC: Recent Labs  Lab 10/28/22 1324 10/29/22 0339 10/30/22 0353 10/31/22 0500 11/02/22 0627  WBC 7.5 8.3 8.4 8.8 10.5  NEUTROABS 4.7  --   --   --   --   HGB 8.9* 9.2* 8.7* 8.6* 9.5*  HCT 27.8* 28.7* 26.8* 26.9* 29.5*  MCV 89.7 88.9 88.7 88.5 88.6  PLT 384 404* 397 402* 505   Basic Metabolic Panel: Recent Labs  Lab 10/28/22 0550 10/30/22 0353 10/31/22 0500 11/02/22 0627  NA 140 141 142 141  K 3.5 3.4* 3.3* 3.9  CL 98 98 101 103  CO2 37* 38* 35* 30  GLUCOSE 114* 133* 103* 109*  BUN '21 22 20 17  '$ CREATININE 1.09 1.11 1.08 0.96  CALCIUM 8.2* 7.7* 7.8* 8.1*  MG  --   --  2.1 2.2  PHOS  --    --  2.6 2.6    Studies: No results found.  Scheduled Meds:  apixaban  5 mg Oral BID   aspirin EC  81 mg Oral Daily   atorvastatin  80 mg Oral Daily   carvedilol  3.125 mg Oral BID WC   Chlorhexidine Gluconate Cloth  6 each Topical Daily   docusate sodium  100 mg Oral BID   furosemide  40 mg Oral Daily   guaiFENesin  600 mg Oral BID   insulin aspart  0-5 Units Subcutaneous QHS   insulin aspart  0-9 Units Subcutaneous TID WC   insulin glargine-yfgn  22 Units Subcutaneous  Daily   lactulose  20 g Oral Daily   multivitamin with minerals  1 tablet Oral Daily   pantoprazole  40 mg Oral Daily   sodium chloride flush  10-40 mL Intracatheter Q12H   Continuous Infusions:  ceFTAROline (TEFLARO) IV 600 mg (11/02/22 1324)   promethazine (PHENERGAN) injection (IM or IVPB)     vancomycin 750 mg (11/02/22 1110)   PRN Meds: acetaminophen **OR** acetaminophen, diphenhydrAMINE, guaiFENesin-dextromethorphan, HYDROmorphone (DILAUDID) injection, hyoscyamine, ondansetron **OR** ondansetron (ZOFRAN) IV, mouth rinse, oxyCODONE, promethazine (PHENERGAN) injection (IM or IVPB), sodium chloride flush, Uribel  Time spent: 50 minutes  Author: Val Riles. MD [facility] 11/02/2022 1:46 PM  To reach On-call, see care teams to locate the attending and reach out to them via [email or web address]. If 7PM-7AM, please contact night-coverage If you still have difficulty reaching the attending provider, please page the [facility] (Director on Call) for [facility] on amion for assistance.

## 2022-11-03 DIAGNOSIS — E1065 Type 1 diabetes mellitus with hyperglycemia: Secondary | ICD-10-CM | POA: Diagnosis not present

## 2022-11-03 DIAGNOSIS — I5022 Chronic systolic (congestive) heart failure: Secondary | ICD-10-CM | POA: Diagnosis not present

## 2022-11-03 DIAGNOSIS — M00062 Staphylococcal arthritis, left knee: Secondary | ICD-10-CM | POA: Diagnosis not present

## 2022-11-03 DIAGNOSIS — I82452 Acute embolism and thrombosis of left peroneal vein: Secondary | ICD-10-CM | POA: Diagnosis not present

## 2022-11-03 LAB — CREATININE, SERUM
Creatinine, Ser: 0.88 mg/dL (ref 0.61–1.24)
GFR, Estimated: >60 mL/min (ref >60)

## 2022-11-03 LAB — GLUCOSE, CAPILLARY
Glucose-Capillary: 148 mg/dL — ABNORMAL HIGH (ref 70–99)
Glucose-Capillary: 154 mg/dL — ABNORMAL HIGH (ref 70–99)
Glucose-Capillary: 175 mg/dL — ABNORMAL HIGH (ref 70–99)
Glucose-Capillary: 179 mg/dL — ABNORMAL HIGH (ref 70–99)

## 2022-11-03 MED ORDER — PHENOL 1.4 % MT LIQD
1.0000 | OROMUCOSAL | Status: DC | PRN
  Administered 2022-11-03: 1 via OROMUCOSAL
  Filled 2022-11-03: qty 177

## 2022-11-03 NOTE — Plan of Care (Signed)
  Problem: Coping: Goal: Ability to adjust to condition or change in health will improve Outcome: Progressing   Problem: Fluid Volume: Goal: Ability to maintain a balanced intake and output will improve Outcome: Progressing   Problem: Health Behavior/Discharge Planning: Goal: Ability to identify and utilize available resources and services will improve Outcome: Progressing   Problem: Clinical Measurements: Goal: Respiratory complications will improve Outcome: Progressing   Problem: Clinical Measurements: Goal: Cardiovascular complication will be avoided Outcome: Progressing   Problem: Nutrition: Goal: Adequate nutrition will be maintained Outcome: Progressing   Problem: Coping: Goal: Level of anxiety will decrease Outcome: Progressing   Problem: Elimination: Goal: Will not experience complications related to bowel motility Outcome: Progressing   Problem: Pain Managment: Goal: General experience of comfort will improve Outcome: Progressing   Problem: Skin Integrity: Goal: Risk for impaired skin integrity will decrease Outcome: Progressing

## 2022-11-03 NOTE — Progress Notes (Signed)
Physical Therapy Treatment Patient Details Name: [name] MRN: [id number] DOB: 10-11-1946 Today's Date: 11/03/2022   History of Present Illness Patient here septic left knee, s/p I & D. WBAT. Pt. has had multiple recent similar admissions.    PT Comments    Partner, Saralyn Pilar, in for session and educated at length regarding home, mobility and safety. Pt continues to demonstrate orthostatic BP's with a 63 point systolic drop, 15 diastolic.  Entered in flow sheets.  Pt has been orthostatic on and off over the past month limiting mobility at times.  BP returns to baseline with transition back to supine.  After feeling better he does want and is able to transfer to recliner with min a x 1.  Further gait deferred.  Pt has no equipment at home.  Will recommend wheelchair as household mobility at this time is not consistently safe given BP's.  He will need a BSC and Saralyn Pilar will purchase a RW prior to discharge.     Patient suffers from continued orthostatic BP's  which impairs his/her ability to perform daily activities like toileting, feeding, dressing, grooming, bathing in the home. A cane, walker, crutch will not resolve the patient's issue with performing activities of daily living. A lightweight wheelchair and cushion is required/recommended and will allow patient to safely perform daily activities.   Patient can safely propel the wheelchair in the home or has a caregiver who can provide assistance.   Recommend +24 hour care with assist for mobility for safety.  They have been given a gait belt for use at home.  Will continue to address mobility and education until anticipated discharge home next week.    Recommendations for follow up therapy are one component of a multi-disciplinary discharge planning process, led by the attending physician.  Recommendations may be updated based on patient status, additional functional criteria and insurance authorization.  Follow Up  Recommendations  Follow physician's recommendations for discharge plan and follow up therapies     Assistance Recommended at Discharge Set up Supervision/Assistance  Patient can return home with the following A little help with walking and/or transfers;Assistance with cooking/housework;Assist for transportation;Help with stairs or ramp for entrance;A little help with bathing/dressing/bathroom;Direct supervision/assist for medications management   Equipment Recommendations  BSC/3in1;Wheelchair (measurements PT);Wheelchair cushion (measurements PT)    Recommendations for Other Services  HHPT      Precautions / Restrictions Precautions Precautions: Fall Precaution Comments: orthostatic/ Dizziness Restrictions Weight Bearing Restrictions: Yes LLE Weight Bearing: Weight bearing as tolerated     Mobility  Bed Mobility Overal bed mobility: Needs Assistance Bed Mobility: Supine to Sit, Sit to Supine     Supine to sit: Supervision Sit to supine: Supervision        Transfers Overall transfer level: Needs assistance Equipment used: Rolling walker (2 wheels) Transfers: Sit to/from Stand Sit to Stand: Min assist           General transfer comment: cues for safety    Ambulation/Gait Ambulation/Gait assistance: Min guard Gait Distance (Feet): 3 Feet Assistive device: Rolling walker (2 wheels) Gait Pattern/deviations: Step-through pattern, Antalgic Gait velocity: decreased     General Gait Details: limited by dizziness and orthostatic BP;s   Stairs             Wheelchair Mobility    Modified Rankin (Stroke Patients Only)       Balance Overall balance assessment: Needs assistance Sitting-balance support: Feet supported Sitting balance-Leahy Scale: Good     Standing balance support: Bilateral upper extremity  supported, During functional activity Standing balance-Leahy Scale: Fair Standing balance comment: dizziness increases with time                             Cognition Arousal/Alertness: Awake/alert Behavior During Therapy: WFL for tasks assessed/performed Overall Cognitive Status: Within Functional Limits for tasks assessed                                          Exercises      General Comments        Pertinent Vitals/Pain Pain Assessment Pain Assessment: Faces Faces Pain Scale: Hurts a little bit Pain Location: back and L foot Pain Descriptors / Indicators: Discomfort, Sore Pain Intervention(s): Limited activity within patient's tolerance, Monitored during session, Repositioned    Home Living                          Prior Function            PT Goals (current goals can now be found in the care plan section) Progress towards PT goals: Progressing toward goals    Frequency           PT Plan Current plan remains appropriate    Co-evaluation              AM-PAC PT "6 Clicks" Mobility   Outcome Measure  Help needed turning from your back to your side while in a flat bed without using bedrails?: None Help needed moving from lying on your back to sitting on the side of a flat bed without using bedrails?: None Help needed moving to and from a bed to a chair (including a wheelchair)?: A Little Help needed standing up from a chair using your arms (e.g., wheelchair or bedside chair)?: A Little Help needed to walk in hospital room?: A Little Help needed climbing 3-5 steps with a railing? : A Lot 6 Click Score: 19    End of Session Equipment Utilized During Treatment: Gait belt Activity Tolerance: Patient tolerated treatment well;Treatment limited secondary to medical complications (Comment) Patient left: in chair;with call bell/phone within reach;with chair alarm set;with family/visitor present Nurse Communication: Mobility status PT Visit Diagnosis: Muscle weakness (generalized) (M62.81);Difficulty in walking, not elsewhere classified (R26.2);Pain Pain -  Right/Left: Left Pain - part of body: Knee     Time: 1250-1344 PT Time Calculation (min) (ACUTE ONLY): 54 min  Charges:  $Therapeutic Activity: 53-67 mins                   Chesley Noon, PTA 11/03/22, 1:59 PM

## 2022-11-03 NOTE — TOC Progression Note (Signed)
Transition of Care [facility]) - Progression Note    Patient Details  Name: RIDER ERMIS MRN: [id number] Date of Birth: [birth year]  Transition of [facility]) CM/SW Contact  Izola Price, RN Phone Number: 11/03/2022, 9:31 AM  Clinical Narrative: 12/23: Damaris Schooner with [facility] Sayers regarding the potential for home infusion of medications due to one medications being too expensive for SNF to accept while patient is receiving it. Pam will reach out to the ID provider as well, but did not think drug approval and procurement would be able to be complete till Tuesday or Wednesday due to Holiday issues. Home situation needs to be explored as patient was bound for SNF at [facility] and whether going home is a safe discharge plan for patient along with IV antibiotic issues. Will attempt look at home situation/SNF issues and to secure a pending [place] agency while this is worked out over next several days. Simmie Davies RN CM            Expected Discharge Plan and Services                                               Social Determinants of Health (SDOH) Interventions SDOH Screenings   Food Insecurity: No Food Insecurity (10/21/2022)  Housing: Low Risk  (10/21/2022)  Transportation Needs: No Transportation Needs (10/21/2022)  Utilities: Not At Risk (10/21/2022)  Tobacco Use: Medium Risk (10/23/2022)    Readmission Risk Interventions     No data to display

## 2022-11-03 NOTE — Plan of Care (Signed)

## 2022-11-03 NOTE — Progress Notes (Signed)
Date of Admission:  10/21/2022      ID: [name] is a 76 y.o. male Principal Problem:   Septic arthritis of knee, left ([name]) Active Problems:   HYPERTENSION, BENIGN   DVT (deep venous thrombosis) ([name])   Uncontrolled type 1 diabetes mellitus with hyperglycemia, with long-term current use of insulin ([name])   Chronic systolic CHF (congestive heart failure) ([name])   Overweight (BMI 25.0-29.9)   Chronic constipation   Influenza A    Subjective:  Feeling good Worked with PT- walking a few yards in the room  Medications:   apixaban  5 mg Oral BID   aspirin EC  81 mg Oral Daily   atorvastatin  80 mg Oral Daily   carvedilol  3.125 mg Oral BID WC   Chlorhexidine Gluconate Cloth  6 each Topical Daily   docusate sodium  100 mg Oral BID   furosemide  40 mg Oral Daily   guaiFENesin  600 mg Oral BID   insulin aspart  0-5 Units Subcutaneous QHS   insulin aspart  0-9 Units Subcutaneous TID WC   insulin glargine-yfgn  22 Units Subcutaneous Daily   lactulose  20 g Oral Daily   multivitamin with minerals  1 tablet Oral Daily   pantoprazole  40 mg Oral Daily   sodium chloride flush  10-40 mL Intracatheter Q12H    Objective: Vital signs in last 24 hours: Temp:  [98.2 F (36.8 C)-98.7 F (37.1 C)] 98.2 F (36.8 C) (12/24 0821) Pulse Rate:  [62-65] 65 (12/24 0821) Resp:  [16-17] 17 (12/24 0821) BP: (115-132)/(53-75) 132/67 (12/24 0821) SpO2:  [93 %-100 %] 93 % (12/24 0821)  LDA Rt PICC  PHYSICAL EXAM:  General: Alert, cooperative, no distress at rest Lungs: b/l air entry Heart: Regular rate and rhythm, no murmur, rub or gallop. Abdomen: Soft, non-tender,not distended. Bowel sounds normal. No masses Extremities: left knee surgical site covered with dressing- able to flex the knee better than before B/l heel pressure injury- covered with dressing Skin: No rashes or lesions. Or bruising Lymph: Cervical, supraclavicular normal. Neurologic: Grossly non-focal  Lab  Results Recent Labs    11/02/22 0627 11/03/22 0549  WBC 10.5  --   HGB 9.5*  --   HCT 29.5*  --   NA 141  --   K 3.9  --   CL 103  --   CO2 30  --   BUN 17  --   CREATININE 0.96 0.88    Microbiology:  10/03/22 2 sets blood culture MRSA 10/03/22 - left knee - MRSA 10/05/22 BC X 2 - NG 10/07/22 Synovial fluid MRSA 10/21/22 Synovium MRSA Vanco MIC 2 10/25/22 BC- NG  Daptomycin MIC 3 ( resistant) Ceftaroline < 1 susceptible   Assessment/Plan:  MRSA bacteremia 10/02/22 Repeat BC 112/5/23 NG TEE 10/07/22 No endocarditis Has been on daptomycin  which is now resistant    Left knee septic arthritis due to MRSA with daptomycin resistance and vanco MIC creep 11/22 washout MRSA 10/07/22 washout MRSA 10/21/22 washout MRSA-- the vancomycin MIC has increased to 2 from 1 before .  Added Ceftaroline  as dual MRSA coverage on 10/25/22 Daptomycin MIC was sent to [facility] and reported as 3 which is resistant DC dapto and started vanco on 12/22 along with ceftaroline Unable to use rifampin instead because he is on eliquis HE is going to need 4-6 weeks of IV antibiotic from his last washout - 12/01/22-  Will Continue dual MRSA coverage  Unable to  arrange home infusion until Tuesday Also cannot go to SNF because of ceftaroline  Influenza A resp illness -on Tamiflu- will complete 5 days   DVT- Non occlusive clot left peroneal vein on 11/22- on eliquis   Anemia He has dizziness on standing- could be postural orthostasis ? Need for PRBC ? H/o complicated UTI due to MRSA in May 2023 HE has urethral stricture and gets frequent dilatation Also has bladder diverticulum This may put him ar risk for MRSA colonization of urine   H/o ca bladder  SNF will not take him on ceftaroline So either he goes to [facility] or home ( when ready)  Discussed the management with patient and care team

## 2022-11-03 NOTE — Progress Notes (Signed)
[facility] Progress Note  Patient: [name]    BDZ:[id number]  DOA: 10/21/2022     Date of Service: the patient was seen and examined on 11/03/2022  No chief complaint on file.  Brief hospital course: Patient is a 76 year old male with past medical history of type 1 diabetes mellitus on insulin pump, history of left leg DVT on Eliquis, CAD status post angioplasty, bladder cancer and previous history of MRSA infection who presented to the emergency room on 11/22 and was admitted to the hospitalist service for recurrent septic arthritis of left knee.     Patient underwent irrigation debridement and partial meniscectomy on 11/23 with repeat irrigation debridement on 11/27 and was placed on IV daptomycin for 6 weeks.  He was discharged on 12/4 to skilled nursing.     Patient was seen in the orthopedic office on the morning of 12/11 for suture removal and noted to have purulent drainage coming from the wound.  Orthopedics  took patient to the OR on 12/11 afternoon for repeat arthroscopic debridement and hospitalists were contacted for admission. Hemovac drain removed 10/23/22, possible need for additional washout on 10/24/22. Cultures showing few staph aureus.  Gram stain with rare gram + cocci in singles - pending susceptibilities as of 12/15 AM.  Patient is currently on Daptomycin. Culture came back as (+)MRSA, given lack of substantial improvement, ID would like to change abx and keep him here through the weekend. SNF rehab plan for Monday if doing well.    Evening 12/15 elevated temp and cough, ordered CXR and repeat BCx and UA though primary concern is for knee. 12/16 reviewed results - CXR concerning for interstitial edema/pneumonia worse on R, remains w/ fever, SpO2 documented as 70s on RA asked RN team to reassess and confirm if on O2. AM labs show normal WBC, Hgb to 7.6. UA not collected. Asked ID on call (Dr Linus Salmons) to review abx and determine if any changes needed - he  confirmed ok to continue w/ current plan. UA did return w/ large Hgb, no leuks/WBC but showing >50 WBC/HPF on micorscopy - await culture results. Serum WBC normal - not meeting sepsis criteria. Hypoxic on room air improved on [place]. 12/17 CT neg PE, did show bronchial thickening w/ mucoid impaction in lower lobes c/w infectious vs inflammatory bronchitis, mild emphysema, reflux IV contrast in the hepatic veins suggestive of increased right heart pressure. Incentive spirometry and another dose lasix po ordered for today but held d/t soft BP. Remains afebrile and denies SOB 12/17-12/18, but tested (+)influenza, started tamiflu. SNF placement delayed also d/t cost of abx, ID following for alternative, awaiting send-out labs for MIC on daptomycin.    Consultants:  Orthopedic surgery Infectious disease    Procedures: 10/21/22 Arthroscopic irrigation debridement of left knee (Dr [name] - ortho surgery)   Assessment and Plan: Principal Problem:   Septic arthritis of knee, left ([place]) Active Problems:   Chronic systolic CHF (congestive heart failure) ([place])   Uncontrolled type 1 diabetes mellitus with hyperglycemia, with long-term current use of insulin ([name])   DVT (deep venous thrombosis) ([name])   HYPERTENSION, BENIGN   Chronic constipation   Overweight (BMI 25.0-29.9)   #Septic arthritis of knee, left ([name]) MRSA - Appreciate orthopedic intervention and ID guidance.   - Status post repeat arthroscopic debridement 10/21/22.   - Continue IV antibiotics per ID --> added ceftaroline to daptomycin 10/25/22  - 12/22 MIC shows resistance to daptomycin, ID aware, discontinued daptomycin and started vancomycin, continue Teflaro -  Patient wants to go home with home health set up, we need to discuss with the [facility] for arrangement at home and vancomycin needs to be therapeutic range before he goes home.  - Patient will need antibiotics for 4 weeks  #Influenza A Fever - resolved S/p Tamiflu completed 5-day course  on 12/23 RN was advised to wean oxygen to room air. As per TOC patient cannot go to rehab for 7 days due to flu virus infection  12/22 started Mucinex 600 mg p.o. twice daily, Tussionex as needed cough  Acute hypoxic respiratory failure - improved  D/t mild CHF exacerbation, Influenza  Supplemental O2 as needed Intermittent diuresis - caution w/ soft BP   Chronic systolic CHF (congestive heart failure) ([place]) At patient's last hospitalization, 2D echo and TEE confirmed ejection fraction of 35 to 40%.  Lasix daily - extra dose prn    Uncontrolled type 1 diabetes mellitus with hyperglycemia, with long-term current use of insulin ([name]) Resume home medications and sliding scale  12/22 decrease to Semglee 22 units daily due to hypoglycemia episodes Monitor CBG and titrate dose accordingly. Continue sliding scale.  DVT (deep venous thrombosis) ([name]) on Eliquis.     Essential hypertension w/ soft BP here  Stable Will resume home medications when blood pressure starts to rise   Chronic constipation Will start Colace and lactulose.   Overweight (BMI 25.0-29.9) Meets criteria BMI greater than 25 ? H/o complicated UTI due to MRSA in May 2023 HE has urethral stricture and gets frequent dilatation Also has bladder diverticulum This may put him ar risk for MRSA colonization of urine   H/o Ca bladder Follow-up with urologist and oncology as an outpatient.  Body mass index is 28.06 kg/m.   Interventions:   Pressure Injury 10/14/22 Heel Right Deep Tissue Pressure Injury - Purple or maroon localized area of discolored intact skin or blood-filled blister due to damage of underlying soft tissue from pressure and/or shear. purple discolored intact skin (Active)  10/14/22 0830  Location: Heel  Location Orientation: Right  Staging: Deep Tissue Pressure Injury - Purple or maroon localized area of discolored intact skin or blood-filled blister due to damage of underlying soft tissue from  pressure and/or shear.  Wound Description (Comments): purple discolored intact skin  Present on Admission: No  Dressing Type Compression wrap 11/02/22 2035     Pressure Injury 10/14/22 Heel Left Deep Tissue Pressure Injury - Purple or maroon localized area of discolored intact skin or blood-filled blister due to damage of underlying soft tissue from pressure and/or shear. purple discolored intact skin (Active)  10/14/22 0830  Location: Heel  Location Orientation: Left  Staging: Deep Tissue Pressure Injury - Purple or maroon localized area of discolored intact skin or blood-filled blister due to damage of underlying soft tissue from pressure and/or shear.  Wound Description (Comments): purple discolored intact skin  Present on Admission: No  Dressing Type Compression wrap 11/02/22 2035     Diet: Heart healthy/carb modified DVT Prophylaxis: Therapeutic Anticoagulation with Eliquis    Advance goals of care discussion: Full code  Family Communication: family was present at bedside, at the time of interview.  The pt provided permission to discuss medical plan with the family. Opportunity was given to ask question and all questions were answered satisfactorily.   Disposition:  Pt is from SNF, admitted with left knee septic arthritis, still has infection, on IV antibiotics, needs HH setup, cannot go to SNF due to cost of antibiotics, which precludes a safe discharge.  Discharge to Home with [facility] set up, when cleared by ID most likely in 1 to 2 days.  Subjective: Denies acute complaints to me. D/w TOC, we need to clarify antibiotics; He seems to be improving and progressing as expected. Sugars slightly elevated but his nutrition is key to healing here. We'll monitor x24 as he did have episode of hypoglycemia reported 2 days prior.   Physical Exam: General:  alert oriented to time, place, and person.  Appear in no distress, affect appropriate Eyes: PERRLA ENT: Oral Mucosa Clear, moist  Neck:  no JVD,  Cardiovascular: S1 and S2 Present, no Murmur,  Respiratory: good respiratory effort, Bilateral Air entry equal and Decreased, no Crackles, no wheezes Abdomen: Bowel Sound present, Soft and no tenderness,  Skin: no rashes Extremities: no Pedal edema, no calf tenderness, Left Knee mild swelling, dressing CDI Neurologic: without any new focal findings Gait not checked due to patient safety concerns  Vitals:   11/02/22 0913 11/02/22 1643 11/03/22 0015 11/03/22 0821  BP: (!) 148/62 128/75 (!) 115/53 132/67  Pulse: 65 62 63 65  Resp: '18  16 17  '$ Temp: 98 F (36.7 C) 98.7 F (37.1 C) 98.4 F (36.9 C) 98.2 F (36.8 C)  TempSrc:   Oral   SpO2: 99% 100% 98% 93%  Weight:      Height:        Intake/Output Summary (Last 24 hours) at 11/03/2022 1234 Last data filed at 11/03/2022 7322 Gross per 24 hour  Intake 525.5 ml  Output 950 ml  Net -424.5 ml    Filed Weights   10/21/22 1454  Weight: 86.2 kg    Data Reviewed: I have personally reviewed and interpreted daily labs, tele strips, imagings as discussed above. I reviewed all nursing notes, pharmacy notes, vitals, pertinent old records I have discussed plan of care as described above with RN and patient/family.  CBC: Recent Labs  Lab 10/28/22 1324 10/29/22 0339 10/30/22 0353 10/31/22 0500 11/02/22 0627  WBC 7.5 8.3 8.4 8.8 10.5  NEUTROABS 4.7  --   --   --   --   HGB 8.9* 9.2* 8.7* 8.6* 9.5*  HCT 27.8* 28.7* 26.8* 26.9* 29.5*  MCV 89.7 88.9 88.7 88.5 88.6  PLT 384 404* 397 402* 025    Basic Metabolic Panel: Recent Labs  Lab 10/28/22 0550 10/30/22 0353 10/31/22 0500 11/02/22 0627 11/03/22 0549  NA 140 141 142 141  --   K 3.5 3.4* 3.3* 3.9  --   CL 98 98 101 103  --   CO2 37* 38* 35* 30  --   GLUCOSE 114* 133* 103* 109*  --   BUN '21 22 20 17  '$ --   CREATININE 1.09 1.11 1.08 0.96 0.88  CALCIUM 8.2* 7.7* 7.8* 8.1*  --   MG  --   --  2.1 2.2  --   PHOS  --   --  2.6 2.6  --      Studies: No results  found.  Scheduled Meds:  apixaban  5 mg Oral BID   aspirin EC  81 mg Oral Daily   atorvastatin  80 mg Oral Daily   carvedilol  3.125 mg Oral BID WC   Chlorhexidine Gluconate Cloth  6 each Topical Daily   docusate sodium  100 mg Oral BID   furosemide  40 mg Oral Daily   guaiFENesin  600 mg Oral BID   insulin aspart  0-5 Units Subcutaneous QHS   insulin aspart  0-9  Units Subcutaneous TID WC   insulin glargine-yfgn  22 Units Subcutaneous Daily   lactulose  20 g Oral Daily   multivitamin with minerals  1 tablet Oral Daily   pantoprazole  40 mg Oral Daily   sodium chloride flush  10-40 mL Intracatheter Q12H   Continuous Infusions:  ceFTAROline (TEFLARO) IV 600 mg (11/03/22 1020)   promethazine (PHENERGAN) injection (IM or IVPB)     vancomycin Stopped (11/02/22 2355)   PRN Meds: acetaminophen **OR** acetaminophen, diphenhydrAMINE, guaiFENesin-dextromethorphan, HYDROmorphone (DILAUDID) injection, hyoscyamine, ondansetron **OR** ondansetron (ZOFRAN) IV, mouth rinse, oxyCODONE, promethazine (PHENERGAN) injection (IM or IVPB), sodium chloride flush, Uribel  Time spent: 50 minutes  Author: Vanna Scotland, MD [facility] 11/03/2022 12:34 PM  To reach On-call, see care teams to locate the attending and reach out to them via [email or web address]. If 7PM-7AM, please contact night-coverage If you still have difficulty reaching the attending provider, please page the [facility] (Director on Call) for [facility] on amion for assistance.

## 2022-11-04 DIAGNOSIS — M00062 Staphylococcal arthritis, left knee: Secondary | ICD-10-CM | POA: Diagnosis not present

## 2022-11-04 DIAGNOSIS — I82452 Acute embolism and thrombosis of left peroneal vein: Secondary | ICD-10-CM | POA: Diagnosis not present

## 2022-11-04 DIAGNOSIS — E1065 Type 1 diabetes mellitus with hyperglycemia: Secondary | ICD-10-CM | POA: Diagnosis not present

## 2022-11-04 DIAGNOSIS — I5022 Chronic systolic (congestive) heart failure: Secondary | ICD-10-CM | POA: Diagnosis not present

## 2022-11-04 LAB — GLUCOSE, CAPILLARY
Glucose-Capillary: 126 mg/dL — ABNORMAL HIGH (ref 70–99)
Glucose-Capillary: 181 mg/dL — ABNORMAL HIGH (ref 70–99)
Glucose-Capillary: 142 mg/dL — ABNORMAL HIGH (ref 70–99)
Glucose-Capillary: 195 mg/dL — ABNORMAL HIGH (ref 70–99)

## 2022-11-04 LAB — VANCOMYCIN, PEAK: Vancomycin Pk: 27 ug/mL — ABNORMAL LOW (ref 30–40)

## 2022-11-04 LAB — CREATININE, SERUM
Creatinine, Ser: 0.97 mg/dL (ref 0.61–1.24)
GFR, Estimated: >60 mL/min (ref >60)

## 2022-11-04 LAB — VANCOMYCIN, TROUGH: Vancomycin Tr: 18 ug/mL (ref 15–20)

## 2022-11-04 MED ORDER — VANCOMYCIN HCL 1250 MG/250ML IV SOLN
1250.0000 mg | INTRAVENOUS | Status: DC
  Administered 2022-11-04: 1250 mg via INTRAVENOUS
  Filled 2022-11-04: qty 250

## 2022-11-04 NOTE — Progress Notes (Signed)
Pharmacy Antibiotic Note  [name] is a 76 y.o. male admitted on 10/21/2022 with septic right knee and MRSA bacteremia discharged on Daptomycin but re-admitted 12/11 as pus seen at suture line at Orthopedic follow-up appointment.  Pharmacy has been consulted for vancomycin dosing.  The susceptibility testing for daptomycin against the latest MRSA knee culture from 12/11 showed MIC = 3 (resistant)  Today, 11/04/2022 Day 11 ceftaroline Day 4 vancomycin (stopping daptomycin d/t S aureus being resistant) 12/19 CK = 21 (stable) Renal function stable on most recent labs WBC WNL on most recent labs 12/18 influenza A positive 12/15 started ceftaroline and sent out daptomycin MIC to ensure susceptible.  Sent to [place] - awaiting results  Plan: Vp=27, Vt=18 Will adjust Vancomycin dose to '1250mg'$  Q24 hours Calculated AUC: 452, Cmin: 10.9 Monitor renal function closely(currently stable, scr~1) Continue ceftaroline '600mg'$  q8h ID following Increase atorvastatin back to '80mg'$ now that off daptomycin    Height: '5\' 9"'$  (175.3 cm) Weight: 86.2 kg (190 lb) IBW/kg (Calculated) : 70.7  Temp (24hrs), Avg:97.7 F (36.5 C), Min:97.4 F (36.3 C), Max:98.2 F (36.8 C)  Recent Labs  Lab 10/29/22 0339 10/30/22 0353 10/31/22 0500 11/02/22 0627 11/03/22 0549 11/04/22 0142 11/04/22 0603 11/04/22 1047  WBC 8.3 8.4 8.8 10.5  --   --   --   --   CREATININE  --  1.11 1.08 0.96 0.88  --  0.97  --   VANCOTROUGH  --   --   --   --   --   --   --  18  VANCOPEAK  --   --   --   --   --  27*  --   --      Estimated Creatinine Clearance: 71.6 mL/min (by C-G formula based on SCr of 0.97 mg/dL).    No Known Allergies  Antimicrobials this admission: 11/25 daptomycin >> 12/22 12/15 ceftaroline >> 12/22 vancomycin >>  Microbiology results: 11/23 BCx >> 4/4 GPC (BCID MRSA) 11/24 L-knee (OR cx) >> abundant staph aureus 12/11 WCx: MRSA  Thank you for allowing pharmacy to be a part of this patient's  care.  Pearla Dubonnet, PharmD Clinical Pharmacist 11/04/2022 1:56 PM

## 2022-11-04 NOTE — Plan of Care (Signed)
  Problem: Education: Goal: Ability to describe self-care measures that may prevent or decrease complications (Diabetes Survival Skills Education) will improve Outcome: Progressing   Problem: Health Behavior/Discharge Planning: Goal: Ability to manage health-related needs will improve Outcome: Progressing   Problem: Skin Integrity: Goal: Risk for impaired skin integrity will decrease Outcome: Progressing

## 2022-11-04 NOTE — Progress Notes (Signed)
[facility] Progress Note  Patient: [name]    WSF:[id number]  DOA: 10/21/2022     Date of Service: the patient was seen and examined on 11/04/2022  No chief complaint on file.  Brief hospital course: Patient is a 76 year old male with past medical history of type 1 diabetes mellitus on insulin pump, history of left leg DVT on Eliquis, CAD status post angioplasty, bladder cancer and previous history of MRSA infection who presented to the emergency room on 11/22 and was admitted to the hospitalist service for recurrent septic arthritis of left knee.     Patient underwent irrigation debridement and partial meniscectomy on 11/23 with repeat irrigation debridement on 11/27 and was placed on IV daptomycin for 6 weeks.  He was discharged on 12/4 to skilled nursing.     Patient was seen in the orthopedic office on the morning of 12/11 for suture removal and noted to have purulent drainage coming from the wound.  Orthopedics  took patient to the OR on 12/11 afternoon for repeat arthroscopic debridement and hospitalists were contacted for admission. Hemovac drain removed 10/23/22, possible need for additional washout on 10/24/22. Cultures showing few staph aureus.  Gram stain with rare gram + cocci in singles - pending susceptibilities as of 12/15 AM.  Patient is currently on Daptomycin. Culture came back as (+)MRSA, given lack of substantial improvement, ID would like to change abx and keep him here through the weekend. SNF rehab plan for Monday if doing well.    Evening 12/15 elevated temp and cough, ordered CXR and repeat BCx and UA though primary concern is for knee. 12/16 reviewed results - CXR concerning for interstitial edema/pneumonia worse on R, remains w/ fever, SpO2 documented as 70s on RA asked RN team to reassess and confirm if on O2. AM labs show normal WBC, Hgb to 7.6. UA not collected. Asked ID on call (Dr Linus Salmons) to review abx and determine if any changes needed - he  confirmed ok to continue w/ current plan. UA did return w/ large Hgb, no leuks/WBC but showing >50 WBC/HPF on micorscopy - await culture results. Serum WBC normal - not meeting sepsis criteria. Hypoxic on room air improved on [place]. 12/17 CT neg PE, did show bronchial thickening w/ mucoid impaction in lower lobes c/w infectious vs inflammatory bronchitis, mild emphysema, reflux IV contrast in the hepatic veins suggestive of increased right heart pressure. Incentive spirometry and another dose lasix po ordered for today but held d/t soft BP. Remains afebrile and denies SOB 12/17-12/18, but tested (+)influenza, started tamiflu. SNF placement delayed also d/t cost of abx, ID following for alternative, awaiting send-out labs for MIC on daptomycin.    Consultants:  Orthopedic surgery Infectious disease    Procedures: 10/21/22 Arthroscopic irrigation debridement of left knee (Dr [name] - ortho surgery)   Assessment and Plan: Principal Problem:   Septic arthritis of knee, left ([place]) Active Problems:   Chronic systolic CHF (congestive heart failure) ([place])   Uncontrolled type 1 diabetes mellitus with hyperglycemia, with long-term current use of insulin ([name])   DVT (deep venous thrombosis) ([name])   HYPERTENSION, BENIGN   Chronic constipation   Overweight (BMI 25.0-29.9)   #Septic arthritis of knee, left ([name]) MRSA - Appreciate orthopedic intervention and ID guidance.   - Status post repeat arthroscopic debridement 10/21/22.   - Continue IV antibiotics per ID --> added ceftaroline to daptomycin 10/25/22  - 12/22 MIC shows resistance to daptomycin, ID aware, discontinued daptomycin and started vancomycin, continue Teflaro -  Patient wants to go home with home health set up, we need to discuss with the [facility] for arrangement at home and vancomycin needs to be therapeutic range before he goes home.  - Patient will need antibiotics for 4 weeks  #Influenza A Fever - resolved S/p Tamiflu completed 5-day course  on 12/23 RN was advised to wean oxygen to room air. As per TOC patient cannot go to rehab for 7 days due to flu virus infection  12/22 started Mucinex 600 mg p.o. twice daily, Tussionex as needed cough  Acute hypoxic respiratory failure - improved  D/t mild CHF exacerbation, Influenza  Supplemental O2 as needed Intermittent diuresis - caution w/ soft BP   Chronic systolic CHF (congestive heart failure) ([place]) At patient's last hospitalization, 2D echo and TEE confirmed ejection fraction of 35 to 40%.  Lasix daily - extra dose prn    Uncontrolled type 1 diabetes mellitus with hyperglycemia, with long-term current use of insulin ([name]) Resume home medications and sliding scale  12/22 decrease to Semglee 22 units daily due to hypoglycemia episodes Monitor CBG and titrate dose accordingly. Continue sliding scale.  DVT (deep venous thrombosis) ([name]) on Eliquis.     Essential hypertension w/ soft BP here  Stable Will resume home medications when blood pressure starts to rise   Chronic constipation Will start Colace and lactulose.   Overweight (BMI 25.0-29.9) Meets criteria BMI greater than 25 ? H/o complicated UTI due to MRSA in May 2023 HE has urethral stricture and gets frequent dilatation Also has bladder diverticulum This may put him ar risk for MRSA colonization of urine   H/o Ca bladder Follow-up with urologist and oncology as an outpatient.  Body mass index is 28.06 kg/m.   Interventions:   Pressure Injury 10/14/22 Heel Right Deep Tissue Pressure Injury - Purple or maroon localized area of discolored intact skin or blood-filled blister due to damage of underlying soft tissue from pressure and/or shear. purple discolored intact skin (Active)  10/14/22 0830  Location: Heel  Location Orientation: Right  Staging: Deep Tissue Pressure Injury - Purple or maroon localized area of discolored intact skin or blood-filled blister due to damage of underlying soft tissue from  pressure and/or shear.  Wound Description (Comments): purple discolored intact skin  Present on Admission: No  Dressing Type Compression wrap 11/04/22 0829     Pressure Injury 10/14/22 Heel Left Deep Tissue Pressure Injury - Purple or maroon localized area of discolored intact skin or blood-filled blister due to damage of underlying soft tissue from pressure and/or shear. purple discolored intact skin (Active)  10/14/22 0830  Location: Heel  Location Orientation: Left  Staging: Deep Tissue Pressure Injury - Purple or maroon localized area of discolored intact skin or blood-filled blister due to damage of underlying soft tissue from pressure and/or shear.  Wound Description (Comments): purple discolored intact skin  Present on Admission: No  Dressing Type Compression wrap 11/04/22 0865     Diet: Heart healthy/carb modified DVT Prophylaxis: Therapeutic Anticoagulation with Eliquis    Advance goals of care discussion: Full code  Family Communication: family was present at bedside, at the time of interview.  The pt provided permission to discuss medical plan with the family. Opportunity was given to ask question and all questions were answered satisfactorily.   Disposition:  Pt is from SNF, admitted with left knee septic arthritis, still has infection, on IV antibiotics, needs HH setup, cannot go to SNF due to cost of antibiotics, which precludes a safe discharge.  Discharge to Home with [facility] set up, when cleared by ID most likely in 1 to 2 days.  Subjective:  No acute events in the overnight shift. D/w TOC, we need to clarify antibiotics, but given resistance pattern this may prove difficult. He is improving overall. Has obtained influenza through floor breakout, but he is hemodynamically stable and improving.Blood glucose pOCs are normal,   Physical Exam: General:  alert oriented to time, place, and person.  Appear in no distress, affect appropriate Eyes: PERRLA ENT: Oral Mucosa  Clear, moist  Neck: no JVD,  Cardiovascular: S1 and S2 Present, no Murmur,  Respiratory: good respiratory effort, Bilateral Air entry equal and Decreased, no Crackles, no wheezes Abdomen: Bowel Sound present, Soft and no tenderness,  Skin: no rashes Extremities: no Pedal edema, no calf tenderness, Left Knee mild swelling, dressing CDI Neurologic: without any new focal findings Gait not checked due to patient safety concerns  Vitals:   11/04/22 0117 11/04/22 0835 11/04/22 1038 11/04/22 1052  BP: (!) 113/55 134/60 133/69 (!) 103/57  Pulse: 65 68 66 72  Resp: 18     Temp: 98.2 F (36.8 C) (!) 97.4 F (36.3 C)    TempSrc:      SpO2: 93% 98% 93% 95%  Weight:      Height:        Intake/Output Summary (Last 24 hours) at 11/04/2022 1138 Last data filed at 11/04/2022 0500 Gross per 24 hour  Intake 250 ml  Output 900 ml  Net -650 ml    Filed Weights   10/21/22 1454  Weight: 86.2 kg    Data Reviewed: I have personally reviewed and interpreted daily labs, tele strips, imagings as discussed above. I reviewed all nursing notes, pharmacy notes, vitals, pertinent old records I have discussed plan of care as described above with RN and patient/family.  CBC: Recent Labs  Lab 10/28/22 1324 10/29/22 0339 10/30/22 0353 10/31/22 0500 11/02/22 0627  WBC 7.5 8.3 8.4 8.8 10.5  NEUTROABS 4.7  --   --   --   --   HGB 8.9* 9.2* 8.7* 8.6* 9.5*  HCT 27.8* 28.7* 26.8* 26.9* 29.5*  MCV 89.7 88.9 88.7 88.5 88.6  PLT 384 404* 397 402* 962    Basic Metabolic Panel: Recent Labs  Lab 10/30/22 0353 10/31/22 0500 11/02/22 0627 11/03/22 0549 11/04/22 0603  NA 141 142 141  --   --   K 3.4* 3.3* 3.9  --   --   CL 98 101 103  --   --   CO2 38* 35* 30  --   --   GLUCOSE 133* 103* 109*  --   --   BUN '22 20 17  '$ --   --   CREATININE 1.11 1.08 0.96 0.88 0.97  CALCIUM 7.7* 7.8* 8.1*  --   --   MG  --  2.1 2.2  --   --   PHOS  --  2.6 2.6  --   --      Studies: No results found.   Scheduled Meds:  apixaban  5 mg Oral BID   aspirin EC  81 mg Oral Daily   atorvastatin  80 mg Oral Daily   carvedilol  3.125 mg Oral BID WC   Chlorhexidine Gluconate Cloth  6 each Topical Daily   docusate sodium  100 mg Oral BID   furosemide  40 mg Oral Daily   guaiFENesin  600 mg Oral BID   insulin aspart  0-5 Units Subcutaneous  QHS   insulin aspart  0-9 Units Subcutaneous TID WC   insulin glargine-yfgn  22 Units Subcutaneous Daily   lactulose  20 g Oral Daily   multivitamin with minerals  1 tablet Oral Daily   pantoprazole  40 mg Oral Daily   sodium chloride flush  10-40 mL Intracatheter Q12H   Continuous Infusions:  ceFTAROline (TEFLARO) IV 600 mg (11/04/22 1032)   promethazine (PHENERGAN) injection (IM or IVPB)     vancomycin 750 mg (11/03/22 2326)   PRN Meds: acetaminophen **OR** acetaminophen, diphenhydrAMINE, guaiFENesin-dextromethorphan, HYDROmorphone (DILAUDID) injection, hyoscyamine, ondansetron **OR** ondansetron (ZOFRAN) IV, mouth rinse, oxyCODONE, phenol, promethazine (PHENERGAN) injection (IM or IVPB), sodium chloride flush, Uribel  Time spent: 50 minutes  Author: Vanna Scotland, MD [facility] 11/04/2022 11:38 AM  To reach On-call, see care teams to locate the attending and reach out to them via [email or web address]. If 7PM-7AM, please contact night-coverage If you still have difficulty reaching the attending provider, please page the [facility] (Director on Call) for [facility] on amion for assistance.

## 2022-11-04 NOTE — Plan of Care (Signed)

## 2022-11-04 NOTE — Progress Notes (Signed)
1820 PICC line dressing changed to R arm. Mask applied to nurse and pt. Sterile technique used. Pt tolerated.

## 2022-11-05 DIAGNOSIS — M00062 Staphylococcal arthritis, left knee: Secondary | ICD-10-CM | POA: Diagnosis not present

## 2022-11-05 LAB — CREATININE, SERUM
Creatinine, Ser: 1 mg/dL (ref 0.61–1.24)
GFR, Estimated: >60 mL/min (ref >60)

## 2022-11-05 LAB — GLUCOSE, CAPILLARY
Glucose-Capillary: 201 mg/dL — ABNORMAL HIGH (ref 70–99)
Glucose-Capillary: 254 mg/dL — ABNORMAL HIGH (ref 70–99)
Glucose-Capillary: 148 mg/dL — ABNORMAL HIGH (ref 70–99)
Glucose-Capillary: 218 mg/dL — ABNORMAL HIGH (ref 70–99)

## 2022-11-05 MED ORDER — VANCOMYCIN IV (FOR PTA / DISCHARGE USE ONLY): 1500.0000 mg | INTRAVENOUS | 0 refills | Status: AC

## 2022-11-05 MED ORDER — CEFTAROLINE IV (FOR PTA / DISCHARGE USE ONLY): 600.0000 mg | Freq: Three times a day (TID) | INTRAVENOUS | 0 refills | Status: DC

## 2022-11-05 MED ORDER — VANCOMYCIN HCL 1500 MG/300ML IV SOLN
1500.0000 mg | INTRAVENOUS | Status: DC
  Administered 2022-11-05 – 2022-11-06 (×2): 1500 mg via INTRAVENOUS
  Filled 2022-11-05 (×3): qty 300

## 2022-11-05 NOTE — Plan of Care (Signed)
  Problem: Education: Goal: Ability to describe self-care measures that may prevent or decrease complications (Diabetes Survival Skills Education) will improve Outcome: Progressing   Problem: Skin Integrity: Goal: Risk for impaired skin integrity will decrease Outcome: Progressing   Problem: Nutrition: Goal: Adequate nutrition will be maintained Outcome: Progressing   Problem: Activity: Goal: Risk for activity intolerance will decrease Outcome: Progressing

## 2022-11-05 NOTE — Progress Notes (Signed)
Pharmacy Antibiotic Note  [name] is a 76 y.o. male admitted on 10/21/2022 with septic right knee and MRSA bacteremia discharged on Daptomycin but re-admitted 12/11 as pus seen at suture line at Orthopedic follow-up appointment.  Pharmacy has been consulted for vancomycin dosing.  The susceptibility testing for daptomycin against the latest MRSA knee culture from 12/11 showed MIC = 3 (resistant)  Today, 11/05/2022 Day 11 ceftaroline Day 4 vancomycin (stopped daptomycin d/t S aureus being resistant) 12/19 CK = 21 (stable) Renal function stable on most recent labs WBC WNL on most recent labs 12/18 influenza A positive s/p tamiflu  Vancomycin levels 12/25 on vancomycin '750mg'$  IV q12h Dose given at 23:26 Vanco peak 27 at 01:42 Vanc trough 18 at 10:47 True Cmin 17.5, AUC = 543   Plan: Will make small adjustment to vancomycin to push trough slightly towards 15 mcg/ml, start vancomycin '1500mg'$  IV q24h Goal AUC 400-600 New AUC = 543, peak of 35.6 and trough = 13.1 Monitor renal function closely(currently stable, scr~1) Continue ceftaroline '600mg'$  q8h ID following Aw[facility] home infusion to run cost of vancomycin and ceftaroline at home Increased atorvastatin back to '80mg'$ now that off daptomycin    Height: '5\' 9"'$  (175.3 cm) Weight: 86.2 kg (190 lb) IBW/kg (Calculated) : 70.7  Temp (24hrs), Avg:98.6 F (37 C), Min:98.4 F (36.9 C), Max:98.8 F (37.1 C)  Recent Labs  Lab 10/30/22 0353 10/31/22 0500 11/02/22 0627 11/03/22 0549 11/04/22 0142 11/04/22 0603 11/04/22 1047  WBC 8.4 8.8 10.5  --   --   --   --   CREATININE 1.11 1.08 0.96 0.88  --  0.97  --   VANCOTROUGH  --   --   --   --   --   --  18  VANCOPEAK  --   --   --   --  27*  --   --      Estimated Creatinine Clearance: 71.6 mL/min (by C-G formula based on SCr of 0.97 mg/dL).    No Known Allergies  Antimicrobials this admission: 11/25 daptomycin >> 12/22 12/15 ceftaroline >> 12/22 vancomycin  >>  Microbiology results: 11/23 BCx >> 4/4 GPC (BCID MRSA) 11/24 L-knee (OR cx) >> abundant staph aureus 12/11 WCx: MRSA  Thank you for allowing pharmacy to be a part of this patient's care.  Doreene Eland, PharmD, BCPS, BCIDP Work Cell: [phone] 11/05/2022 1:35 PM

## 2022-11-05 NOTE — Progress Notes (Signed)
Progress Note   Patient: [name]:[id number] DOB: [birth year] DOA: 10/21/2022     15 DOS: the patient was seen and examined on 11/05/2022   Brief hospital course: Patient is a 76 year old male with past medical history of type 1 diabetes mellitus on insulin pump, history of left leg DVT on Eliquis, CAD status post angioplasty, bladder cancer and previous history of MRSA infection who presented to the emergency room on 11/22 and was admitted to the hospitalist service for recurrent septic arthritis of left knee.     Patient underwent irrigation debridement and partial meniscectomy on 11/23 with repeat irrigation debridement on 11/27 and was placed on IV daptomycin for 6 weeks.  He was discharged on 12/4 to skilled nursing.     Patient was seen in the orthopedic office on the morning of 12/11 for suture removal and noted to have purulent drainage coming from the wound.  Orthopedics  took patient to the OR on 12/11 afternoon for repeat arthroscopic debridement and hospitalists were contacted for admission. Hemovac drain removed 10/23/22, possible need for additional washout on 10/24/22. Cultures showing few staph aureus.  Gram stain with rare gram + cocci in singles - pending susceptibilities as of 12/15 AM.  Patient is currently on Daptomycin. Culture came back as (+)MRSA, given lack of substantial improvement, ID would like to change abx and keep him here through the weekend. SNF rehab plan for Monday if doing well.    Evening 12/15 elevated temp and cough, ordered CXR and repeat BCx and UA though primary concern is for knee. 12/16 reviewed results - CXR concerning for interstitial edema/pneumonia worse on R, remains w/ fever, SpO2 documented as 70s on RA asked RN team to reassess and confirm if on O2. AM labs show normal WBC, Hgb to 7.6. UA not collected. Asked ID on call (Dr Linus Salmons) to review abx and determine if any changes needed - he confirmed ok to continue w/ current plan. UA did  return w/ large Hgb, no leuks/WBC but showing >50 WBC/HPF on micorscopy - await culture results. Serum WBC normal - not meeting sepsis criteria. Hypoxic on room air improved on [place]. 12/17 CT neg PE. Remains afebrile and denies SOB 12/17-12/18, but tested (+)influenza, started tamiflu. SNF placement delayed also d/t cost of abx, ID following for alternative. Looking for [facility] optoins.  Consultants:  Orthopedic surgery Infectious disease    Procedures: 10/21/22 Arthroscopic irrigation debridement of left knee (Dr [name] - ortho surgery)  Assessment and Plan: * Septic arthritis of knee, left [facility]) Appreciate orthopedic intervention.  Status post repeat arthroscopic debridement.  Continue IV Ceftaroline and Vancomycin. Will need 4 weeks total. Will attempt to get [facility] set up so that he may continue therapy at home.  Chronic systolic CHF (congestive heart failure) ([place]) At patient's last hospitalization, 2D echo and TEE confirmed ejection fraction of 35 to 40%.  BNP minimally elevated, will give 1 dose of IV Lasix.  Uncontrolled type 1 diabetes mellitus with hyperglycemia, with long-term current use of insulin ([name]) On long and short-acting insulins Has SSI coverage  DVT (deep venous thrombosis) ([name]) On Eliquis  HYPERTENSION, BENIGN On coreg  Chronic constipation Patient has issues with chronic constipation.  He states he has not had a bowel movement since the day he discharged a week ago.  Will start some Colace and lactulose.  Overweight (BMI 25.0-29.9) Meets criteria BMI greater than 25  Influenza A S/p Tamiflu  Hyperlipidemia On atorvastatin        Subjective:  Feels well today.  He is much improved sutures are removed today by Ortho and they noted no new issues  Physical Exam: Vitals:   11/04/22 1038 11/04/22 1052 11/04/22 1536 11/05/22 0017  BP: 133/69 (!) 103/57 114/65 (!) 123/56  Pulse: 66 72 64 68  Resp:    16  Temp:   98.8 F (37.1 C) 98.4 F (36.9 C)  TempSrc:       SpO2: 93% 95% 95% 95%  Weight:      Height:       Physical Examination: General appearance - alert, well appearing, and in no distress Chest - normal effort Heart - normal rate and regular rhythm  Data Reviewed: { Results for orders placed or performed during the hospital encounter of 10/21/22 (from the past 24 hour(s))  Glucose, capillary     Status: Abnormal   Collection Time: 11/04/22  5:22 PM  Result Value Ref Range   Glucose-Capillary 142 (H) 70 - 99 mg/dL  Glucose, capillary     Status: Abnormal   Collection Time: 11/04/22  8:55 PM  Result Value Ref Range   Glucose-Capillary 195 (H) 70 - 99 mg/dL  Glucose, capillary     Status: Abnormal   Collection Time: 11/05/22  8:31 AM  Result Value Ref Range   Glucose-Capillary 201 (H) 70 - 99 mg/dL  Glucose, capillary     Status: Abnormal   Collection Time: 11/05/22 12:07 PM  Result Value Ref Range   Glucose-Capillary 254 (H) 70 - 99 mg/dL  Creatinine, serum     Status: None   Collection Time: 11/05/22  1:05 PM  Result Value Ref Range   Creatinine, Ser 1.00 0.61 - 1.24 mg/dL   GFR, Estimated >60 >60 mL/min     Family Communication: Partner at bedside  Disposition: Status is: Inpatient Remains inpatient appropriate because: Need for IV antibiotics and unsafe discharge plan  Planned Discharge Destination: Home with Home Health DVT prophylaxis: Eliquis Time spent: 36 minutes  Author: Donnamae Jude, MD 11/05/2022 4:58 PM  For on call review [email or web address].

## 2022-11-05 NOTE — Progress Notes (Signed)
  Subjective: 15 Days Post-Op Procedure(s) (LRB): ARTHROSCOPIC INCISION AND DRAINAGE KNEE-LEFT (Left) Patient reports no pain in the operative knee at this time.  Patient is well, and has had no acute complaints or problems Plan is to go Skilled nursing facility after hospital stay. Plan was initially to go to SNF however issue with the IV antibiotics, plan for discharge home with HHPT at this time. Negative for chest pain and shortness of breath Fever: no Gastrointestinal: negative for nausea and vomiting.  Patient has had multiple bowel movements since surgery.  Objective: Vital signs in last 24 hours: Temp:  [98.4 F (36.9 C)-98.8 F (37.1 C)] 98.4 F (36.9 C) (12/26 0017) Pulse Rate:  [64-72] 68 (12/26 0017) Resp:  [16] 16 (12/26 0017) BP: (103-133)/(56-69) 123/56 (12/26 0017) SpO2:  [93 %-95 %] 95 % (12/26 0017)  Intake/Output from previous day:  Intake/Output Summary (Last 24 hours) at 11/05/2022 1037 Last data filed at 11/05/2022 0530 Gross per 24 hour  Intake 350 ml  Output 500 ml  Net -150 ml    Intake/Output this shift: No intake/output data recorded.  Labs: No results for input(s): "HGB" in the last 72 hours.  No results for input(s): "WBC", "RBC", "HCT", "PLT" in the last 72 hours.  Recent Labs    11/03/22 0549 11/04/22 0603  CREATININE 0.88 0.97   No results for input(s): "LABPT", "INR" in the last 72 hours.  EXAM General - Patient is Alert, Appropriate, and Oriented Extremity - Neurovascular intact Dorsiflexion/Plantar flexion intact No cellulitis present Compartment soft Dressing/Incision -clean, dry, no drainage, no erythema over knee  ACE wrap intact without drainage, honeycomb dressings applied over the portal sites without any drainage noted. Honeycomb dressings were removed and sutures were removed without issue at today's appointment. Motor Function - intact, moving foot and toes well on exam. Able to perform independent SLR. Can flex and  extend knee without pain.  Abdomen soft with intact bowel sounds this morning.  Assessment/Plan: 15 Days Post-Op Procedure(s) (LRB): ARTHROSCOPIC INCISION AND DRAINAGE KNEE-LEFT (Left) Principal Problem:   Septic arthritis of knee, left ([name]) Active Problems:   HYPERTENSION, BENIGN   DVT (deep venous thrombosis) ([place])   Uncontrolled type 1 diabetes mellitus with hyperglycemia, with long-term current use of insulin ([name])   Chronic systolic CHF (congestive heart failure) ([name])   Overweight (BMI 25.0-29.9)   Chronic constipation   Influenza A  Estimated body mass index is 28.06 kg/m as calculated from the following:   Height as of this encounter: '5\' 9"'$  (1.753 m).   Weight as of this encounter: 86.2 kg. Advance diet Up with therapy  Labs and vitals reviewed this AM.  No recent fevers. Sutures were removed without issue, can get the incisions wet at this time. Currently on IV Ceftaroline and Vancomycin.  Continue per ID. No erythema noted to the left knee, no signs of active infection at this time. Continue with woundcare for bilateral ankles.   Follow-up with [facility] orthopaedics in 10-14 days for repeat skin check of the left knee.  DVT Prophylaxis - Eliquis and Ted hose Weight-Bearing as tolerated to left leg  J. Cameron Proud, [name] [facility] Orthopaedic Surgery 11/05/2022, 10:37 AM

## 2022-11-05 NOTE — Consult Note (Signed)
PHARMACY CONSULT NOTE FOR:  OUTPATIENT  PARENTERAL ANTIBIOTIC THERAPY (OPAT)  Indication: MRSA septic arthritis and MRSA bacteremia Regimen: Vancomycin 1500 mg IV every 24 hours and Ceftaroline 600 mg IV every 8 hours End date: 11/25/22  IV antibiotic discharge orders are pended. To discharging provider:  please sign these orders via discharge navigator,  Select New Orders & click on the button choice - Manage This Unsigned Work.     Thank you for allowing pharmacy to be a part of this patient's care.  Lorin Picket 11/05/2022, 4:47 PM

## 2022-11-05 NOTE — TOC Progression Note (Addendum)
Transition of Care [facility]) - Progression Note    Patient Details  Name: HATCHER FRONING MRN: [id number] Date of Birth: 1946/03/20  Transition of Care [facility]) CM/SW [place], RN Phone Number: 11/05/2022, 1:42 PM  Clinical Narrative:    Reached out to [place] at [facility] and asked to get Pricing for IV ABX at home, Met with the patient and his partner in the room, they indicated that they prefer to go home with [facility] and they will hire additional care at home' He will need a wheelchair to go home and a 3 in1 I explained how Home infusion works Pam will get back to me with pricing The patient may need Home O2 as well, will continue to follow for that need Provided the names of several companies that do private duty to hire.  Reached out to [place] to see if they can accept for [facility] PT, OT and RN      Expected Discharge Plan and Services                                               Social Determinants of Health (SDOH) Interventions [place]: No Food Insecurity (10/21/2022)  Housing: Low Risk  (10/21/2022)  Transportation Needs: No Transportation Needs (10/21/2022)  Utilities: Not At Risk (10/21/2022)  Tobacco Use: Medium Risk (10/23/2022)    Readmission Risk Interventions     No data to display

## 2022-11-05 NOTE — Treatment Plan (Signed)
Diagnosis: MRSA septic arthritis and MRSA bacteremia Resistant to daptomycin  Baseline Creatinine 1    No Known Allergies  OPAT Orders Discharge antibiotics: Vancomycin 1532m IVPB every 24 hours Per pharmacy protocol  Aim for Vancomycin trough 15-20 (unless otherwise indicated) Ceftaroline 6023mIV every 8 hours Duration: 4 weeks End Date: 11/25/22   [facility]  Labs weekly on Monday while on IV antibiotics: _X_ CBC with differential __ BMP _X_ CMP _X_ CRP _X_ ESR _X_ Vancomycin trough  Labs every Thursday while on antibiotics  X Vanco trough X Basic metabolic panel  _X_ Please pull PIC at completion of IV antibiotics __ Please leave PIC in place until doctor has seen patient or been notified  Fax weekly lab results  promptly to [phone] Follow Up Appt:with Dr.Ceci Taliaferro 11/21/22 at 10.30 AM   Call [phone] any questions

## 2022-11-05 NOTE — Hospital Course (Addendum)
Patient is a 76 year old male with past medical history of type 1 diabetes mellitus on insulin pump, history of left leg DVT on Eliquis, CAD status post angioplasty, bladder cancer and previous history of MRSA infection who presented to the emergency room on 11/22 and was admitted to the hospitalist service for recurrent septic arthritis of left knee.     Patient underwent irrigation debridement and partial meniscectomy on 11/23 with repeat irrigation debridement on 11/27 and was placed on IV daptomycin for 6 weeks.  He was discharged on 12/4 to skilled nursing.     Patient was seen in the orthopedic office on the morning of 12/11 for suture removal and noted to have purulent drainage coming from the wound.  Orthopedics  took patient to the OR on 12/11 afternoon for repeat arthroscopic debridement and hospitalists were contacted for admission. Hemovac drain removed 10/23/22, possible need for additional washout on 10/24/22. Cultures showing few staph aureus.  Gram stain with rare gram + cocci in singles - pending susceptibilities as of 12/15 AM.  Patient is currently on Daptomycin. Culture came back as (+)MRSA, given lack of substantial improvement, ID would like to change abx and keep him here through the weekend. SNF rehab plan for Monday if doing well.    Evening 12/15 elevated temp and cough, ordered CXR and repeat BCx and UA though primary concern is for knee. 12/16 reviewed results - CXR concerning for interstitial edema/pneumonia worse on R, remains w/ fever, SpO2 documented as 70s on RA asked RN team to reassess and confirm if on O2. AM labs show normal WBC, Hgb to 7.6. UA not collected. Asked ID on call (Dr Linus Salmons) to review abx and determine if any changes needed - he confirmed ok to continue w/ current plan. UA did return w/ large Hgb, no leuks/WBC but showing >50 WBC/HPF on micorscopy - await culture results. Serum WBC normal - not meeting sepsis criteria. Hypoxic on room air improved on [place]. 12/17  CT neg PE. Remains afebrile and denies SOB 12/17-12/18, but tested (+)influenza, started tamiflu. SNF placement delayed also d/t cost of abx, ID following for alternative. Looking for [facility] optoins.  Consultants:  Orthopedic surgery Infectious disease    Procedures: 10/21/22 Arthroscopic irrigation debridement of left knee (Dr [name] - ortho surgery)

## 2022-11-05 NOTE — Progress Notes (Signed)
Physical Therapy Treatment Patient Details Name: [name] MRN: [id number] DOB: 1946-03-21 Today's Date: 11/05/2022   History of Present Illness Patient here septic left knee, s/p I & D. WBAT. Pt. has had multiple recent similar admissions.    PT Comments    Pt was awake watching TV in long sitting upon arrival. He is A and O x 3 and agreeable to session. Pt wearing 2 L o2 with sao2 99%. Removed O2 throughout session with sao2 >89% however pt endorse O2 make him feel less nauseous/"woozy." Resting BP in bed 122/65(77), upon sitting up EOB BP 90/55 with him endorsing symptoms. Was able to maintaining sitting EOB x ~ 5 minutes with BP slowly elevating. After 5 minutes seated EOB BP 98/59 with symptoms only slightly improved. MAP remained > 62 throughout remainder of session however pt endorses feeling poorly. Pt was able to stand and take steps to recliner with RW. He is planning to DC home today. If pt does Dcs home, recommend frequent BP checks and use of w/c for any further distances. Acute PT will continue to follow up until DC.    Recommendations for follow up therapy are one component of a multi-disciplinary discharge planning process, led by the attending physician.  Recommendations may be updated based on patient status, additional functional criteria and insurance authorization.  Follow Up Recommendations  Follow physician's recommendations for discharge plan and follow up therapies (pt is planning to return home with partner with [facility] srevices.)     Assistance Recommended at Discharge Set up Supervision/Assistance  Patient can return home with the following A little help with walking and/or transfers;Assistance with cooking/housework;Assist for transportation;Help with stairs or ramp for entrance;A little help with bathing/dressing/bathroom;Direct supervision/assist for medications management   Equipment Recommendations  Rolling walker (2 wheels);BSC/3in1;Other (comment) (pt  has  w/c in room. Will need to confirm w/c belongs to him.)       Precautions / Restrictions Precautions Precautions: Fall Precaution Comments: orthostatic/ Dizziness Restrictions Weight Bearing Restrictions: Yes LLE Weight Bearing: Weight bearing as tolerated     Mobility  Bed Mobility Overal bed mobility: Needs Assistance Bed Mobility: Supine to Sit, Sit to Supine  Supine to sit: Supervision Sit to supine: Supervision     Transfers Overall transfer level: Needs assistance Equipment used: Rolling walker (2 wheels) Transfers: Sit to/from Stand Sit to Stand: Min guard        General transfer comment: CGA for safety. Vcs for handplacement. poor eccentric controlled lowering from standing to sitting to recliner.    Ambulation/Gait Ambulation/Gait assistance: Min guard Gait Distance (Feet): 8 Feet Assistive device: Rolling walker (2 wheels) Gait Pattern/deviations: Step-through pattern, Antalgic Gait velocity: decreased     General Gait Details: pt was able to ambulate ~ 8 ft with RW. no LOB. distance limited by pt's lower BP and not feeling well. Dehydrated? Encouraged intake of fluids and use of W/C until BP and "whooziness." resolve.    Balance Overall balance assessment: Needs assistance Sitting-balance support: Feet supported Sitting balance-Leahy Scale: Good     Standing balance support: Bilateral upper extremity supported, During functional activity Standing balance-Leahy Scale: Fair      Cognition Arousal/Alertness: Awake/alert Behavior During Therapy: WFL for tasks assessed/performed Overall Cognitive Status: Within Functional Limits for tasks assessed      Awareness: Intellectual Problem Solving: Slow processing General Comments: Pt is A and O x 3. does follow commands consistently and is cooperative throughout           General  Comments General comments (skin integrity, edema, etc.): discussed home entry and DME needs. pt has personal w/c but  reports needing RW/BSC still. Was wearing O2 but discontinued throughout session with sao2 dropping to 89 %. pt request to keep O2 on cause "it makes mefeel better."      Pertinent Vitals/Pain Pain Assessment Pain Assessment: 0-10 Pain Score: 3  Pain Location: back and L foot Pain Descriptors / Indicators: Discomfort Pain Intervention(s): Limited activity within patient's tolerance, Monitored during session, Repositioned     PT Goals (current goals can now be found in the care plan section) Acute Rehab PT Goals Patient Stated Goal: go home when able Progress towards PT goals: Progressing toward goals    Frequency    7X/week      PT Plan Current plan remains appropriate    Co-evaluation     PT goals addressed during session: Mobility/safety with mobility;Balance;Proper use of DME;Strengthening/ROM        AM-PAC PT "6 Clicks" Mobility   Outcome Measure  Help needed turning from your back to your side while in a flat bed without using bedrails?: None Help needed moving from lying on your back to sitting on the side of a flat bed without using bedrails?: None Help needed moving to and from a bed to a chair (including a wheelchair)?: A Little Help needed standing up from a chair using your arms (e.g., wheelchair or bedside chair)?: A Little Help needed to walk in hospital room?: A Little Help needed climbing 3-5 steps with a railing? : A Lot 6 Click Score: 19    End of Session   Activity Tolerance: Patient tolerated treatment well;Patient limited by fatigue;Treatment limited secondary to medical complications (Comment) (limited due to lower BP concerns with pt symptomatic with change in positions) Patient left: in chair;with call bell/phone within reach;with chair alarm set;with family/visitor present Nurse Communication: Mobility status PT Visit Diagnosis: Muscle weakness (generalized) (M62.81);Difficulty in walking, not elsewhere classified (R26.2);Pain Pain -  Right/Left: Left Pain - part of body: Knee     Time: 0741-0809 PT Time Calculation (min) (ACUTE ONLY): 28 min  Charges:  $Therapeutic Activity: 23-37 mins                     Julaine Fusi PTA 11/05/22, 9:10 AM

## 2022-11-05 NOTE — Care Management Important Message (Signed)
Important Message  Patient Details  Name: [name] MRN: [id number] Date of Birth: [birth year]   Medicare Important Message Given:  Other (see comment)  Patient is in an isolation room so I called his room [phone]) to review his Important Message from Medicare with him again but there was no answer. Will try again tomorrow.    Juliann Pulse A [name] 11/05/2022, 3:13 PM

## 2022-11-05 NOTE — Progress Notes (Signed)
PHARMACY CONSULT NOTE FOR:  OUTPATIENT  PARENTERAL ANTIBIOTIC THERAPY (OPAT)  Indication: MRSA septic knee with bacteremia, resistant to daptomycin and evidence of MIC creep with vancomycin  Regimen:  Vancomycin 1552m IV q24h Ceftaroline 6041mIV q8h End date: 11/25/2022  Labs - Sunday/Monday:  CBC/D, CMP, CRP, ESR and vancomycin trough. Labs - Thursday:  BMP and vancomycin trough -Please pull PIC at completion of IV antibiotics -Fax weekly lab results  promptly to [phone]  IV antibiotic discharge orders are pended. To discharging provider:  please sign these orders via discharge navigator,  Select New Orders & click on the button choice - Manage This Unsigned Work.     Thank you for allowing pharmacy to be a part of this patient's care.  DuDoreene ElandPharmD, BCPS, BCIDP Work Cell: [phone]/26/2023 4:51 PM

## 2022-11-05 NOTE — Assessment & Plan Note (Signed)
On atorvastatin

## 2022-11-05 NOTE — Progress Notes (Signed)
Occupational Therapy Treatment Patient Details Name: [name] MRN: [id number] DOB: [birth year] Today's Date: 11/05/2022   History of present illness Patient here septic left knee, s/p I & D. WBAT. Pt. has had multiple recent similar admissions.   OT comments  [name] was seen for OT treatment on this date. Upon arrival to room pt reclined in bed, noted to have purewick removed and soiled sheets, agreeable to tx. Pt requires MIN A + RW for BSC t/f and pericare standing, assist for standing balance. RN in to address [place]. Goals remain appropriate, will continue to follow POC. Discharge recommendation remains appropriate.     Recommendations for follow up therapy are one component of a multi-disciplinary discharge planning process, led by the attending physician.  Recommendations may be updated based on patient status, additional functional criteria and insurance authorization.    Follow Up Recommendations  Skilled nursing-short term rehab (<3 hours/day)     Assistance Recommended at Discharge Frequent or constant Supervision/Assistance  Patient can return home with the following  A lot of help with bathing/dressing/bathroom;A little help with walking and/or transfers;Assistance with cooking/housework;Help with stairs or ramp for entrance;Assist for transportation;Direct supervision/assist for financial management;Direct supervision/assist for medications management   Equipment Recommendations  Other (comment)    Recommendations for Other Services      Precautions / Restrictions Precautions Precautions: Fall Precaution Comments: orthostatic/ Dizziness Restrictions Weight Bearing Restrictions: Yes LLE Weight Bearing: Weight bearing as tolerated       Mobility Bed Mobility Overal bed mobility: Needs Assistance Bed Mobility: Supine to Sit, Sit to Supine     Supine to sit: Supervision Sit to supine: Supervision        Transfers Overall transfer level:  Needs assistance Equipment used: Rolling walker (2 wheels) Transfers: Sit to/from Stand, Bed to chair/wheelchair/BSC Sit to Stand: Min guard Stand pivot transfers: Min assist               Balance Overall balance assessment: Needs assistance Sitting-balance support: Feet supported Sitting balance-Leahy Scale: Good     Standing balance support: Single extremity supported, During functional activity Standing balance-Leahy Scale: Fair                             ADL either performed or assessed with clinical judgement   ADL Overall ADL's : Needs assistance/impaired                                       General ADL Comments: MIN A + RW for BSC t/f and pericare standing, assist for standing balance      Cognition Arousal/Alertness: Awake/alert Behavior During Therapy: WFL for tasks assessed/performed Overall Cognitive Status: Within Functional Limits for tasks assessed                                           Pertinent Vitals/ Pain       Pain Assessment Pain Assessment: 0-10 Pain Score: 3  Pain Location: back and L foot Pain Descriptors / Indicators: Discomfort Pain Intervention(s): Limited activity within patient's tolerance, Repositioned   Frequency  Min 2X/week        Progress Toward Goals  OT Goals(current goals can now be found in the care plan section)  Progress towards OT  goals: Progressing toward goals  Acute Rehab OT Goals Patient Stated Goal: to go home OT Goal Formulation: With patient Time For Goal Achievement: 11/19/22 Potential to Achieve Goals: Good ADL Goals Pt Will Perform Grooming: with supervision;standing Pt Will Perform Lower Body Dressing: with min guard assist Pt Will Transfer to Toilet: with min guard assist;ambulating Pt Will Perform Toileting - Clothing Manipulation and hygiene: with min guard assist;sit to/from stand  Plan Discharge plan remains appropriate;Frequency remains  appropriate    Co-evaluation                 AM-PAC OT "6 Clicks" Daily Activity     Outcome Measure   Help from another person eating meals?: None Help from another person taking care of personal grooming?: None Help from another person toileting, which includes using toliet, bedpan, or urinal?: A Lot Help from another person bathing (including washing, rinsing, drying)?: A Lot Help from another person to put on and taking off regular upper body clothing?: A Little Help from another person to put on and taking off regular lower body clothing?: A Lot 6 Click Score: 17    End of Session    OT Visit Diagnosis: Unsteadiness on feet (R26.81);Repeated falls (R29.6);Muscle weakness (generalized) (M62.81);Pain Pain - Right/Left: Left Pain - part of body: Knee   Activity Tolerance Patient tolerated treatment well   Patient Left in bed;with call bell/phone within reach;with nursing/sitter in room   Nurse Communication Mobility status        Time: 5597-4163 OT Time Calculation (min): 12 min  Charges: OT General Charges $OT Visit: 1 Visit OT Treatments $Self Care/Home Management : 8-22 mins  [name], M.S. OTR/L  11/05/22, 4:02 PM  ascom [phone]

## 2022-11-05 NOTE — Assessment & Plan Note (Signed)
S/p Tamiflu

## 2022-11-06 DIAGNOSIS — M00062 Staphylococcal arthritis, left knee: Secondary | ICD-10-CM | POA: Diagnosis not present

## 2022-11-06 DIAGNOSIS — E782 Mixed hyperlipidemia: Secondary | ICD-10-CM

## 2022-11-06 LAB — GLUCOSE, CAPILLARY
Glucose-Capillary: 99 mg/dL (ref 70–99)
Glucose-Capillary: 239 mg/dL — ABNORMAL HIGH (ref 70–99)
Glucose-Capillary: 245 mg/dL — ABNORMAL HIGH (ref 70–99)
Glucose-Capillary: 222 mg/dL — ABNORMAL HIGH (ref 70–99)
Glucose-Capillary: 213 mg/dL — ABNORMAL HIGH (ref 70–99)

## 2022-11-06 LAB — URINALYSIS, COMPLETE (UACMP) WITH MICROSCOPIC
Bacteria, UA: NONE SEEN
Bilirubin Urine: NEGATIVE
Glucose, UA: NEGATIVE mg/dL
Ketones, ur: NEGATIVE mg/dL
Nitrite: NEGATIVE
Protein, ur: 30 mg/dL — AB
Specific Gravity, Urine: 1.011 (ref 1.005–1.030)
pH: 6 (ref 5.0–8.0)

## 2022-11-06 LAB — CREATININE, SERUM
Creatinine, Ser: 1 mg/dL (ref 0.61–1.24)
GFR, Estimated: >60 mL/min (ref >60)

## 2022-11-06 MED ORDER — MIDODRINE HCL 5 MG PO TABS
5.0000 mg | ORAL_TABLET | Freq: Once | ORAL | Status: AC
  Administered 2022-11-06: 5 mg via ORAL
  Filled 2022-11-06: qty 1

## 2022-11-06 MED ORDER — SODIUM CHLORIDE 0.9 % IV BOLUS
250.0000 mL | Freq: Once | INTRAVENOUS | Status: AC
  Administered 2022-11-06: 250 mL via INTRAVENOUS

## 2022-11-06 MED ORDER — MIDODRINE HCL 5 MG PO TABS
5.0000 mg | ORAL_TABLET | Freq: Three times a day (TID) | ORAL | Status: DC
  Administered 2022-11-06: 5 mg via ORAL
  Filled 2022-11-06: qty 1

## 2022-11-06 MED ORDER — MIDODRINE HCL 5 MG PO TABS
10.0000 mg | ORAL_TABLET | Freq: Three times a day (TID) | ORAL | Status: DC
  Administered 2022-11-06 – 2022-11-07 (×3): 10 mg via ORAL
  Filled 2022-11-06 (×3): qty 2

## 2022-11-06 NOTE — Progress Notes (Signed)
Date of Admission:  10/21/2022      ID: [name] is a 76 y.o. male Principal Problem:   Septic arthritis of knee, left ([place]) Active Problems:   Hyperlipidemia   HYPERTENSION, BENIGN   DVT (deep venous thrombosis) ([place])   Uncontrolled type 1 diabetes mellitus with hyperglycemia, with long-term current use of insulin ([name])   Chronic systolic CHF (congestive heart failure) ([name])   Overweight (BMI 25.0-29.9)   Chronic constipation   Influenza A   Subjective: Feeling better  No fever Left knee pain better  Medications:   apixaban  5 mg Oral BID   aspirin EC  81 mg Oral Daily   atorvastatin  80 mg Oral Daily   carvedilol  3.125 mg Oral BID WC   Chlorhexidine Gluconate Cloth  6 each Topical Daily   docusate sodium  100 mg Oral BID   furosemide  40 mg Oral Daily   guaiFENesin  600 mg Oral BID   insulin aspart  0-5 Units Subcutaneous QHS   insulin aspart  0-9 Units Subcutaneous TID WC   insulin glargine-yfgn  22 Units Subcutaneous Daily   lactulose  20 g Oral Daily   midodrine  10 mg Oral TID WC   midodrine  5 mg Oral Once   multivitamin with minerals  1 tablet Oral Daily   pantoprazole  40 mg Oral Daily   sodium chloride flush  10-40 mL Intracatheter Q12H    Objective: Vital signs in last 24 hours: Temp:  [98.1 F (36.7 C)-98.6 F (37 C)] 98.3 F (36.8 C) (12/27 0820) Pulse Rate:  [61-67] 67 (12/27 0820) Resp:  [15-16] 15 (12/27 0820) BP: (110-121)/(54-59) 110/54 (12/27 0820) SpO2:  [93 %-95 %] 93 % (12/27 0820)  LDA Rt PICC  PHYSICAL EXAM:  General: Alert, cooperative, no distress at rest Lungs: b/l air entry Heart: Regular rate and rhythm, no murmur, rub or gallop. Abdomen: Soft, non-tender,not distended. Bowel sounds normal. No masses Extremities: left knee surgical site looks fine B/l heel pressure injury- covered with dressing Skin: No rashes or lesions. Or bruising Lymph: Cervical, supraclavicular normal. Neurologic: Grossly  non-focal  Lab Results Recent Labs    11/05/22 1305 11/06/22 0707  CREATININE 1.00 1.00    Microbiology:  10/03/22 2 sets blood culture MRSA 10/03/22 - left knee - MRSA 10/05/22 BC X 2 - NG 10/07/22 Synovial fluid MRSA 10/21/22 Synovium MRSA Vanco MIC 2 10/25/22 BC- NG  Daptomycin MIC 3 ( resistant) Ceftaroline < 1 susceptible   Assessment/Plan:  MRSA bacteremia 10/02/22 Repeat BC 112/5/23 NG TEE 10/07/22 No endocarditis Was  on daptomycin  which is now resistant    Left knee septic arthritis due to MRSA Possible bone infarct VS early osteo of medial femoral condyle and tibial plateau 11/22 washout MRSA 10/07/22 washout MRSA 10/21/22 washout MRSA-- the vancomycin MIC has increased to 2 from 1 before .  Added Ceftaroline  as dual MRSA coverage on 10/25/22 Daptomycin MIC was sent to [facility]  and it was  3 which is resistant  DC dapto and started vanco along with ceftaroline Unable to use rifampin instead because he is on eliquis HE will get 4 more weeks of IV antibiotics until 11/25/22 and after that can do linezolid for 2 more weeks   Influenza A resp illness completed tamiflu   DVT- Non occlusive clot left peroneal vein on 11/22- on eliquis   Anemia He has dizziness on standing- could be postural orthostasis ? Need for PRBC ? H/o  complicated UTI due to MRSA in May 2023 HE has urethral stricture and gets frequent dilatation Also has bladder diverticulum This may put him ar risk for MRSA colonization of urine   H/o ca bladder  Pt is going home as SNF cannot take him on ceftaroline Also his copay for ceftraoline is 1200 $ /week Will try to minimize the cost by switching to linezolid earlier  if possible   Discussed the management with patient and his partner and to home infusion company nurse

## 2022-11-06 NOTE — Care Management Important Message (Signed)
Important Message  Patient Details  Name: [name] MRN: [id number] Date of Birth: 1946/01/09   Medicare Important Message Given:  Other (see comment)  Patient is in an isolation room so I called his room [phone]) to view the Important Message from Medicare but there was no answer. I will try again.   Juliann Pulse A Mckyla Deckman 11/06/2022, 2:40 PM

## 2022-11-06 NOTE — Progress Notes (Signed)
Patient suffers from non weight bearing and infection in knee which impairs their ability to perform daily activities like ADLs  in the home.  A walking aide will not resolve issue with performing activities of daily living. A wheelchair will allow patient to safely perform daily activities. Patient can safely propel the wheelchair in the home or has a caregiver who can provide assistance. Length of need 6 months,  Accessories: elevating leg rests (ELRs), wheel locks, extensions and anti-tippers. Back cushion

## 2022-11-06 NOTE — Progress Notes (Signed)
PROGRESS NOTE  [name] ENI:[id number] DOB: 22-Nov-1945 DOA: 10/21/2022 PCP: [name], MD   LOS: 16 days   Brief Narrative / Interim history: 76 year old male with past medical history of type 1 diabetes mellitus on insulin pump, history of left leg DVT on Eliquis, CAD status post angioplasty, bladder cancer and previous history of MRSA infection who presented to the emergency room on 11/22 and was admitted to the hospitalist service for recurrent septic arthritis of left knee.   Patient underwent irrigation debridement and partial meniscectomy on 11/23 with repeat irrigation debridement on 11/27 and was placed on IV daptomycin for 6 weeks.  He was discharged on 12/4 to skilled nursing.  Patient was seen in the orthopedic office on the morning of 12/11 for suture removal and noted to have purulent drainage coming from the wound.  Orthopedics  took patient to the OR on 12/11 afternoon for repeat arthroscopic debridement and hospitalists were contacted for admission.   Subjective / 24h Interval events: Doing well. Wanting to go home   Assesement and Plan: Principal Problem:   Septic arthritis of knee, left ([name]) Active Problems:   Chronic systolic CHF (congestive heart failure) ([place])   Uncontrolled type 1 diabetes mellitus with hyperglycemia, with long-term current use of insulin ([name])   DVT (deep venous thrombosis) ([name])   HYPERTENSION, BENIGN   Chronic constipation   Overweight (BMI 25.0-29.9)   Hyperlipidemia   Influenza A   Principal problem MRSA septic arthritis of knee, left [facility]) -orthopedic surgery consulted, he is status post repeat laparoscopic debridement. ID consulted, he is on vancomycin and ceftaroline, end date 1/15.  Home health has been set up  Active problems  Chronic systolic CHF (congestive heart failure) ([place]) -At patient's last hospitalization, 2D echo and TEE confirmed ejection fraction of 35 to 40%.  Appears euvolemic/dry.  Given orthostatic  hypotension will provide a small bolus Uncontrolled type 1 diabetes mellitus with hyperglycemia, with long-term current use of insulin ([name]) -On long and short-acting insulins.  Most recent A1c 7.13 September 2022 DVT (deep venous thrombosis) ([place]) -On Eliquis HTN, now orthostatic hypotension - patient has been more orthostatic this hospitalization, blood pressure this morning dropped into the 60s upon standing up.  Started on midodrine, compression stockings.  Influenza A -S/p Tamiflu Hyperlipidemia -On atorvastatin  Scheduled Meds:  apixaban  5 mg Oral BID   aspirin EC  81 mg Oral Daily   atorvastatin  80 mg Oral Daily   carvedilol  3.125 mg Oral BID WC   Chlorhexidine Gluconate Cloth  6 each Topical Daily   docusate sodium  100 mg Oral BID   furosemide  40 mg Oral Daily   guaiFENesin  600 mg Oral BID   insulin aspart  0-5 Units Subcutaneous QHS   insulin aspart  0-9 Units Subcutaneous TID WC   insulin glargine-yfgn  22 Units Subcutaneous Daily   lactulose  20 g Oral Daily   midodrine  10 mg Oral TID WC   midodrine  5 mg Oral Once   multivitamin with minerals  1 tablet Oral Daily   pantoprazole  40 mg Oral Daily   sodium chloride flush  10-40 mL Intracatheter Q12H   Continuous Infusions:  ceFTAROline (TEFLARO) IV 600 mg (11/06/22 1313)   promethazine (PHENERGAN) injection (IM or IVPB)     vancomycin Stopped (11/05/22 2024)   PRN Meds:.acetaminophen **OR** acetaminophen, diphenhydrAMINE, guaiFENesin-dextromethorphan, HYDROmorphone (DILAUDID) injection, hyoscyamine, ondansetron **OR** ondansetron (ZOFRAN) IV, mouth rinse, oxyCODONE, phenol, promethazine (PHENERGAN) injection (IM or  IVPB), sodium chloride flush, Uribel  Current Outpatient Medications  Medication Instructions   APIXABAN (ELIQUIS) VTE STARTER PACK (10MG AND 5MG) Take as directed on package: start with two-40m tablets twice daily for 7 days. On day 8, switch to one-537mtablet twice daily.   aspirin EC 81 mg, Oral, Daily    atorvastatin (LIPITOR) 40 mg, Oral, Daily   carvedilol (COREG) 6.25 MG tablet TAKE 1 TABLET BY MOUTH TWICE DAILY WITH MEAL   ceftaroline (TEFLARO) IVPB 600 mg, Intravenous, Every 8 hours, Indication:  MRSA septic knee and bacteremia, resistant to daptomycin <BR>First Dose: Yes<BR>Last Day of Therapy:  11/25/2022<BR>Labs - Sunday/Monday:  CBC/D, CMP, CRP, ESR and vancomycin trough.<BR>Labs - Thursday:  BMP and vancomycin trough<BR>Please pull PIC at completion of IV antibiotics<BR>Fax weekly lab results  promptly to [phone]<BR>Method of administration: Mini-Bag Plus / Control-A-Flo (CAF)<BR>Method of administration may be changed at the discretion of home infusion pharmacist based upon assessment of the patient and/or caregiver's ability to self-administer the medication ordered.   cyanocobalamin (VITAMIN B12) 1,000 mcg, Oral, Every other day   daptomycin (CUBICIN) IVPB 700 mg, Intravenous, Every 24 hours, Daptomycin 70080mVPB in 35m85m 0.9% NaCl<BR>Indication:  MRSA bacteremia and knee septic arthritis<BR>Last Day of Therapy:  11/15/2022<BR>Labs - Once weekly:  CBC/D, CMP, CPK, ESR and CRP<BR>Please pull PIC at completion of IV antibiotics<BR>Fax weekly lab results  promptly to [phone]<BR>Method of administration: IVPB<BR>Can adjust method of administration as per pharmacist's discretion to coincide with facility's preferred method of administration   docusate sodium (COLACE) 200 mg, Oral, 2 times daily   furosemide (LASIX) 40 mg, Oral, Daily   glucagon 1 mg, Subcutaneous, Once PRN   Hyoscyamine Sulfate SL (LEVSIN/SL) 0.125-0.25 mg, Sublingual, Every 4 hours PRN, 1-2 TABS   insulin aspart (NOVOLOG) 6 Units, Subcutaneous, 3 times daily with meals   insulin aspart (NOVOLOG) 0-20 Units, Subcutaneous, 3 times daily with meals, CBG < 70:Implement Hypoglycemia Standing Orders and refer to Hypoglycemia Standing Orders sidebar report<BR>CBG 70 - 120: 0 units <BR>CBG 121 - 150: 3 units <BR>CBG 151 -  200: 4 units <BR>CBG 201 - 250: 7 units <BR>CBG 251 - 300: 11 units <BR>CBG 301 - 350: 15 units <BR>CBG 351 - 400: 20 units <BR>CBG > 400 call MD and obtain STAT lab verification   insulin glargine-yfgn (SEMGLEE) 28 Units, Subcutaneous, Daily   Meth-Hyo-M Bl-Na Phos-Ph Sal (URIBEL) 118 MG CAPS 118 mg, Oral, Every 6 hours PRN   Multiple Vitamin (MULTIVITAMIN WITH MINERALS) TABS tablet 1 tablet, Oral, Daily   naproxen sodium (ALEVE) 220-440 mg, Oral, 2 times daily PRN   omeprazole (PRILOSEC) 20 mg, Oral, 2 times daily before meals   ondansetron (ZOFRAN-ODT) 4 mg, Oral, Every 8 hours PRN   oxyCODONE (OXY IR/ROXICODONE) 5-10 mg, Oral, Every 4 hours PRN   oxyCODONE (OXY IR/ROXICODONE) 5 mg, Oral, Every 6 hours PRN   sacubitril-valsartan (ENTRESTO) 24-26 MG 1 tablet, Oral, 2 times daily   vancomycin IVPB 1,500 mg, Intravenous, Every 24 hours, Indication:  MRSA septic knee and bacteremia, resistant to daptomycin <BR>First Dose: Yes<BR>Last Day of Therapy:  11/25/2022<BR>Labs - Sunday/Monday:  CBC/D, CMP, CRP, ESR and vancomycin trough.<BR>Labs - Thursday:  BMP and vancomycin trough<BR>Please pull PIC at completion of IV antibiotics<BR>Fax weekly lab results  promptly to [phone]<BR>Method of administration:Elastomeric<BR>Method of administration may be changed at the discretion of the patient and/or caregiver's ability to self-administer the medication ordered.    Diet Orders (From admission, onward)     Start  Ordered   10/30/22 1606  Diet heart healthy/carb modified Room service appropriate? Yes; Fluid consistency: Thin  Diet effective now       Question Answer Comment  Diet-HS Snack? Nothing   Room service appropriate? Yes   Fluid consistency: Thin      10/30/22 1605            DVT prophylaxis: Place TED hose Start: 11/06/22 0906 SCDs Start: 10/21/22 1836 apixaban (ELIQUIS) tablet 5 mg   Lab Results  Component Value Date   PLT 399 11/02/2022      Code Status: Full  Code  Family Communication: partner at bedside  Status is: Inpatient  Remains inpatient appropriate because: home in am   Level of care: Med-Surg  Consultants:  ID Orthopedic surgery  Objective: Vitals:   11/05/22 0017 11/05/22 1717 11/06/22 0316 11/06/22 0820  BP: (!) 123/56 (!) 121/59 (!) 110/54 (!) 110/54  Pulse: 68 67 61 67  Resp: _0 Temp: 98.4 F (36.9 C) 98.1 F (36.7 C) 98.6 F (37 C) 98.3 F (36.8 C)  TempSrc:    Oral  SpO2: 95% 95% 93% 93%  Weight:      Height:        Intake/Output Summary (Last 24 hours) at 11/06/2022 1446 Last data filed at 11/06/2022 1211 Gross per 24 hour  Intake 700 ml  Output 1300 ml  Net -600 ml   Wt Readings from Last 3 Encounters:  10/21/22 86.2 kg  10/07/22 88.5 kg  08/06/22 90 kg    Examination:  Constitutional: NAD Eyes: no scleral icterus ENMT: Mucous membranes are moist.  Neck: normal, supple Respiratory: clear to auscultation bilaterally, no wheezing, no crackles. Normal respiratory effort. No accessory muscle use.  Cardiovascular: Regular rate and rhythm, no murmurs / rubs / gallops. No LE edema.  Abdomen: non distended, no tenderness. Bowel sounds positive.  Musculoskeletal: no clubbing / cyanosis.   Data Reviewed: I have independently reviewed following labs and imaging studies   CBC Recent Labs  Lab 10/31/22 0500 11/02/22 0627  WBC 8.8 10.5  HGB 8.6* 9.5*  HCT 26.9* 29.5*  PLT 402* 399  MCV 88.5 88.6  MCH 28.3 28.5  MCHC 32.0 32.2  RDW 15.1 15.5    Recent Labs  Lab 10/31/22 0500 11/01/22 0500 11/02/22 0627 11/03/22 0549 11/04/22 0603 11/05/22 1305 11/06/22 0707  NA 142  --  141  --   --   --   --   K 3.3*  --  3.9  --   --   --   --   CL 101  --  103  --   --   --   --   CO2 35*  --  30  --   --   --   --   GLUCOSE 103*  --  109*  --   --   --   --   BUN 20  --  17  --   --   --   --   CREATININE 1.08  --  0.96 0.88 0.97 1.00 1.00  CALCIUM 7.8*  --  8.1*  --   --   --   --    MG 2.1  --  2.2  --   --   --   --   CRP  --  7.4*  --   --   --   --   --     ------------------------------------------------------------------------------------------------------------------ No results for input(s): "CHOL", "  HDL", "LDLCALC", "TRIG", "CHOLHDL", "LDLDIRECT" in the last 72 hours.  Lab Results  Component Value Date   HGBA1C 7.3 (H) 10/03/2022   ------------------------------------------------------------------------------------------------------------------ No results for input(s): "TSH", "T4TOTAL", "T3FREE", "THYROIDAB" in the last 72 hours.  Invalid input(s): "FREET3"  Cardiac Enzymes No results for input(s): "CKMB", "TROPONINI", "MYOGLOBIN" in the last 168 hours.  Invalid input(s): "CK" ------------------------------------------------------------------------------------------------------------------    Component Value Date/Time   BNP 542.4 (H) 10/26/2022 0530    CBG: Recent Labs  Lab 11/05/22 1207 11/05/22 1714 11/05/22 2213 11/06/22 0820 11/06/22 1301  GLUCAP 254* 148* 218* 99 239*    Recent Results (from the past 240 hour(s))  Respiratory (~20 pathogens) panel by PCR     Status: Abnormal   Collection Time: 10/28/22 11:35 AM   Specimen: Nasopharyngeal Swab; Respiratory  Result Value Ref Range Status   Adenovirus NOT DETECTED NOT DETECTED Final   Coronavirus 229E NOT DETECTED NOT DETECTED Final    Comment: (NOTE) The Coronavirus on the Respiratory Panel, DOES NOT test for the novel  Coronavirus (2019 nCoV)    Coronavirus HKU1 NOT DETECTED NOT DETECTED Final   Coronavirus NL63 NOT DETECTED NOT DETECTED Final   Coronavirus OC43 NOT DETECTED NOT DETECTED Final   Metapneumovirus NOT DETECTED NOT DETECTED Final   Rhinovirus / Enterovirus NOT DETECTED NOT DETECTED Final   Influenza A H1 2009 DETECTED (A) NOT DETECTED Final   Influenza B NOT DETECTED NOT DETECTED Final   Parainfluenza Virus 1 NOT DETECTED NOT DETECTED Final   Parainfluenza Virus 2  NOT DETECTED NOT DETECTED Final   Parainfluenza Virus 3 NOT DETECTED NOT DETECTED Final   Parainfluenza Virus 4 NOT DETECTED NOT DETECTED Final   Respiratory Syncytial Virus NOT DETECTED NOT DETECTED Final   Bordetella pertussis NOT DETECTED NOT DETECTED Final   Bordetella Parapertussis NOT DETECTED NOT DETECTED Final   Chlamydophila pneumoniae NOT DETECTED NOT DETECTED Final   Mycoplasma pneumoniae NOT DETECTED NOT DETECTED Final    Comment: Performed at [facility], [place]. [address], [place], [place] [address]  Resp panel by RT-PCR (RSV, Flu A&B, Covid) Anterior Nasal Swab     Status: Abnormal   Collection Time: 10/28/22 11:35 AM   Specimen: Anterior Nasal Swab  Result Value Ref Range Status   SARS Coronavirus 2 by RT PCR NEGATIVE NEGATIVE Final    Comment: (NOTE) SARS-CoV-2 target nucleic acids are NOT DETECTED.  The SARS-CoV-2 RNA is generally detectable in upper respiratory specimens during the acute phase of infection. The lowest concentration of SARS-CoV-2 viral copies this assay can detect is 138 copies/mL. A negative result does not preclude SARS-Cov-2 infection and should not be used as the sole basis for treatment or other patient management decisions. A negative result may occur with  improper specimen collection/handling, submission of specimen other than nasopharyngeal swab, presence of viral mutation(s) within the areas targeted by this assay, and inadequate number of viral copies(<138 copies/mL). A negative result must be combined with clinical observations, patient history, and epidemiological information. The expected result is Negative.  Fact Sheet for Patients:  EntrepreneurPulse.com.au  Fact Sheet for Healthcare Providers:  IncredibleEmployment.be  This test is no t yet approved or cleared by the [place] FDA and  has been authorized for detection and/or diagnosis of SARS-CoV-2 by FDA under an Emergency Use  Authorization (EUA). This EUA will remain  in effect (meaning this test can be used) for the duration of the COVID-19 declaration under Section 564(b)(1) of the Act, 21 U.S.C.section 360bbb-3(b)(1), unless  the authorization is terminated  or revoked sooner.       Influenza A by PCR POSITIVE (A) NEGATIVE Final   Influenza B by PCR NEGATIVE NEGATIVE Final    Comment: (NOTE) The Xpert Xpress SARS-CoV-2/FLU/RSV plus assay is intended as an aid in the diagnosis of influenza from Nasopharyngeal swab specimens and should not be used as a sole basis for treatment. Nasal washings and aspirates are unacceptable for Xpert Xpress SARS-CoV-2/FLU/RSV testing.  Fact Sheet for Patients: EntrepreneurPulse.com.au  Fact Sheet for Healthcare Providers: IncredibleEmployment.be  This test is not yet approved or cleared by the [place] FDA and has been authorized for detection and/or diagnosis of SARS-CoV-2 by FDA under an Emergency Use Authorization (EUA). This EUA will remain in effect (meaning this test can be used) for the duration of the COVID-19 declaration under Section 564(b)(1) of the Act, 21 U.S.C. section 360bbb-3(b)(1), unless the authorization is terminated or revoked.     Resp Syncytial Virus by PCR NEGATIVE NEGATIVE Final    Comment: (NOTE) Fact Sheet for Patients: EntrepreneurPulse.com.au  Fact Sheet for Healthcare Providers: IncredibleEmployment.be  This test is not yet approved or cleared by the [place] FDA and has been authorized for detection and/or diagnosis of SARS-CoV-2 by FDA under an Emergency Use Authorization (EUA). This EUA will remain in effect (meaning this test can be used) for the duration of the COVID-19 declaration under Section 564(b)(1) of the Act, 21 U.S.C. section 360bbb-3(b)(1), unless the authorization is terminated or revoked.  Performed at [facility], [address], [place], [place] [address]      Radiology Studies: No results found.   Marzetta Board, MD, PhD [facility]  Between 7 am - 7 pm I am available, please contact me via Amion (for emergencies) or Securechat (non urgent messages)  Between 7 pm - 7 am I am not available, please contact night coverage MD/APP via Amion

## 2022-11-06 NOTE — Progress Notes (Signed)
Physical Therapy Treatment Patient Details Name: [name] MRN: [id number] DOB: 11/27/1945 Today's Date: 11/06/2022   History of Present Illness Patient here septic left knee, s/p I & D. WBAT. Pt. has had multiple recent similar admissions.    PT Comments    Pt was long sitting in bed, awake and oriented. Endorses more severe L knee pain but was willing to perform OOB activity. Resting BP 101/72. Upon sitting up EOB 98/47 (64) stood EOB and BP dropped 85/45 with MAP (58) pt is symptomatic and required being returned to supine. MD was in room during drop and placed new orders. RN staff made aware. Pt encouraged to increase fluid intake. Pt's caregiver arrived. Reviewed concerns with BP, L heel wound,and need for increased intake. Issued TED stockings and placed on pt. Overall pt has been very limited. He's strength and mobility does not require a lot of physical assistance. Pt has been severely limited by medical concerns/low BP/ and diabetes. Acute PT will continue to follow and progress as able per current POC.     Recommendations for follow up therapy are one component of a multi-disciplinary discharge planning process, led by the attending physician.  Recommendations may be updated based on patient status, additional functional criteria and insurance authorization.  Follow Up Recommendations  Follow physician's recommendations for discharge plan and follow up therapies     Assistance Recommended at Discharge Set up Supervision/Assistance  Patient can return home with the following A little help with walking and/or transfers;Assistance with cooking/housework;Assist for transportation;Help with stairs or ramp for entrance;A little help with bathing/dressing/bathroom;Direct supervision/assist for medications management   Equipment Recommendations  Rolling walker (2 wheels);BSC/3in1       Precautions / Restrictions Precautions Precautions: Fall Precaution Comments: orthostatic/  Dizziness Restrictions Weight Bearing Restrictions: Yes LLE Weight Bearing: Weight bearing as tolerated     Mobility  Bed Mobility Overal bed mobility: Needs Assistance Bed Mobility: Supine to Sit, Sit to Supine  Supine to sit: Supervision Sit to supine: Supervision     Transfers Overall transfer level: Needs assistance Equipment used: Rolling walker (2 wheels) Transfers: Sit to/from Stand Sit to Stand: Min guard    General transfer comment: CGA for safety. BP dropped 85/45 (58)    Ambulation/Gait  General Gait Details: defferred due to low BP concerns   Balance Overall balance assessment: Needs assistance Sitting-balance support: Feet supported Sitting balance-Leahy Scale: Good     Standing balance support: Bilateral upper extremity supported, During functional activity Standing balance-Leahy Scale: Fair       Cognition Arousal/Alertness: Awake/alert, Lethargic (Gets lethargic with mobility. BP dropping MD in room during session) Behavior During Therapy: [facility] for tasks assessed/performed Overall Cognitive Status: Within Functional Limits for tasks assessed    General Comments: Pt is A and O but gets lethargic once BP dropped with mobility               Pertinent Vitals/Pain Pain Assessment Pain Assessment: 0-10 Pain Score: 6  Pain Location: LLE Pain Descriptors / Indicators: Discomfort Pain Intervention(s): Limited activity within patient's tolerance, Monitored during session, Premedicated before session, Repositioned     PT Goals (current goals can now be found in the care plan section) Acute Rehab PT Goals Patient Stated Goal: go home Progress towards PT goals: Progressing toward goals    Frequency    7X/week      PT Plan Current plan remains appropriate    Co-evaluation     PT goals addressed during session: Mobility/safety with mobility;Balance;Proper  use of DME;Strengthening/ROM        AM-PAC PT "6 Clicks" Mobility   Outcome  Measure  Help needed turning from your back to your side while in a flat bed without using bedrails?: None Help needed moving from lying on your back to sitting on the side of a flat bed without using bedrails?: None Help needed moving to and from a bed to a chair (including a wheelchair)?: A Little Help needed standing up from a chair using your arms (e.g., wheelchair or bedside chair)?: A Little Help needed to walk in hospital room?: A Little Help needed climbing 3-5 steps with a railing? : A Little 6 Click Score: 20    End of Session   Activity Tolerance: Treatment limited secondary to medical complications (Comment) (limited by orthostatic hypotension. MD aware) Patient left: in bed;with call bell/phone within reach;with bed alarm set;with family/visitor present Nurse Communication: Mobility status PT Visit Diagnosis: Muscle weakness (generalized) (M62.81);Difficulty in walking, not elsewhere classified (R26.2);Pain Pain - Right/Left: Left Pain - part of body: Knee     Time: 4628-6381 PT Time Calculation (min) (ACUTE ONLY): 39 min  Charges:  $Therapeutic Activity: 38-52 mins                     Julaine Fusi PTA 11/06/22, 9:49 AM

## 2022-11-06 NOTE — Plan of Care (Signed)
  Problem: Education: Goal: Ability to describe self-care measures that may prevent or decrease complications (Diabetes Survival Skills Education) will improve Outcome: Progressing   Problem: Coping: Goal: Ability to adjust to condition or change in health will improve Outcome: Progressing   Problem: Tissue Perfusion: Goal: Adequacy of tissue perfusion will improve Outcome: Progressing   Problem: Education: Goal: Knowledge of General Education information will improve Description: Including pain rating scale, medication(s)/side effects and non-pharmacologic comfort measures Outcome: Progressing

## 2022-11-06 NOTE — TOC Progression Note (Signed)
Transition of Care [facility]) - Progression Note    Patient Details  Name: [name] MRN: [id number] Date of Birth: 02/28/1946  Transition [facility]) CM/SW [place], RN Phone Number: 11/06/2022, 12:33 PM  Clinical Narrative:    Alvis Lemmings accepted for [facility]   Expected Discharge Plan: [place]    Expected Discharge Plan and Services   Discharge Planning Services: CM Consult                               HH Arranged: PT, RN [facility] Agency: [place] Date [facility] Agency Contacted: 11/06/22 Time [place]: 1216 Representative spoke with at [place]: [place] Determinants of Health ([place]) Interventions SDOH Screenings   Food Insecurity: No Food Insecurity (10/21/2022)  Housing: Low Risk  (10/21/2022)  Transportation Needs: No Transportation Needs (10/21/2022)  Utilities: Not At Risk (10/21/2022)  Tobacco Use: Medium Risk (10/23/2022)    Readmission Risk Interventions     No data to display

## 2022-11-06 NOTE — Progress Notes (Signed)
Patient is not able to walk the distance required to go the bathroom, or he/she is unable to safely negotiate stairs required to access the bathroom.  A 3in1 BSC will alleviate this problem  

## 2022-11-06 NOTE — Care Management Important Message (Signed)
Important Message  Patient Details  Name: [name] MRN: [id number] Date of Birth: 1946/03/12   Medicare Important Message Given:  Yes     [name] 11/06/2022, 3:23 PM

## 2022-11-07 ENCOUNTER — Other Ambulatory Visit (HOSPITAL_COMMUNITY): Payer: Self-pay

## 2022-11-07 DIAGNOSIS — M00062 Staphylococcal arthritis, left knee: Secondary | ICD-10-CM | POA: Diagnosis not present

## 2022-11-07 DIAGNOSIS — M009 Pyogenic arthritis, unspecified: Secondary | ICD-10-CM | POA: Diagnosis not present

## 2022-11-07 DIAGNOSIS — E1065 Type 1 diabetes mellitus with hyperglycemia: Secondary | ICD-10-CM | POA: Diagnosis not present

## 2022-11-07 DIAGNOSIS — I5022 Chronic systolic (congestive) heart failure: Secondary | ICD-10-CM | POA: Diagnosis not present

## 2022-11-07 LAB — GLUCOSE, CAPILLARY
Glucose-Capillary: 211 mg/dL — ABNORMAL HIGH (ref 70–99)
Glucose-Capillary: 190 mg/dL — ABNORMAL HIGH (ref 70–99)

## 2022-11-07 LAB — CBC
HCT: 32.9 % — ABNORMAL LOW (ref 39.0–52.0)
Hemoglobin: 10 g/dL — ABNORMAL LOW (ref 13.0–17.0)
MCH: 28.2 pg (ref 26.0–34.0)
MCHC: 30.4 g/dL (ref 30.0–36.0)
MCV: 92.7 fL (ref 80.0–100.0)
Platelets: 312 10*3/uL (ref 150–400)
RBC: 3.55 MIL/uL — ABNORMAL LOW (ref 4.22–5.81)
RDW: 16 % — ABNORMAL HIGH (ref 11.5–15.5)
WBC: 10.8 10*3/uL — ABNORMAL HIGH (ref 4.0–10.5)
nRBC: 0 % (ref 0.0–0.2)

## 2022-11-07 LAB — BASIC METABOLIC PANEL
Anion gap: 9 (ref 5–15)
BUN: 15 mg/dL (ref 8–23)
CO2: 24 mmol/L (ref 22–32)
Calcium: 8 mg/dL — ABNORMAL LOW (ref 8.9–10.3)
Chloride: 104 mmol/L (ref 98–111)
Creatinine, Ser: 0.98 mg/dL (ref 0.61–1.24)
GFR, Estimated: >60 mL/min (ref >60)
Glucose, Bld: 202 mg/dL — ABNORMAL HIGH (ref 70–99)
Potassium: 3.9 mmol/L (ref 3.5–5.1)
Sodium: 137 mmol/L (ref 135–145)

## 2022-11-07 LAB — SEDIMENTATION RATE: Sed Rate: 74 mm/hr — ABNORMAL HIGH (ref 0–20)

## 2022-11-07 LAB — MAGNESIUM: Magnesium: 1.9 mg/dL (ref 1.7–2.4)

## 2022-11-07 MED ORDER — VANCOMYCIN HCL 1500 MG/300ML IV SOLN
1500.0000 mg | INTRAVENOUS | Status: DC
  Administered 2022-11-07: 1500 mg via INTRAVENOUS
  Filled 2022-11-07: qty 300

## 2022-11-07 MED ORDER — APIXABAN 5 MG PO TABS: 5.0000 mg | ORAL_TABLET | Freq: Two times a day (BID) | ORAL | 0 refills | Status: DC

## 2022-11-07 MED ORDER — CARVEDILOL 3.125 MG PO TABS: 3.1250 mg | ORAL_TABLET | Freq: Two times a day (BID) | ORAL | 0 refills | Status: DC

## 2022-11-07 MED ORDER — APIXABAN 5 MG PO TABS: 5.0000 mg | ORAL_TABLET | Freq: Two times a day (BID) | ORAL | Status: DC

## 2022-11-07 MED ORDER — MIDODRINE HCL 10 MG PO TABS: 10.0000 mg | ORAL_TABLET | Freq: Two times a day (BID) | ORAL | 0 refills | Status: DC

## 2022-11-07 MED ORDER — FUROSEMIDE 40 MG PO TABS: 40.0000 mg | ORAL_TABLET | Freq: Every day | ORAL | Status: DC | PRN

## 2022-11-07 NOTE — Inpatient Diabetes Management (Signed)
Inpatient Diabetes Program Recommendations  AACE/ADA: New Consensus Statement on Inpatient Glycemic Control  Target Ranges:  Prepandial:   less than 140 mg/dL      Peak postprandial:   less than 180 mg/dL (1-2 hours)      Critically ill patients:  140 - 180 mg/dL    Latest Reference Range & Units 11/06/22 08:20 11/06/22 13:01 11/06/22 15:21 11/06/22 21:11 11/06/22 22:49 11/07/22 08:02  Glucose-Capillary 70 - 99 mg/dL 99 239 (H) 245 (H) 222 (H) 213 (H) 211 (H)   Review of Glycemic Control  Diabetes history: DM1 Outpatient Diabetes medications: Semglee 28 units daily, Novolog 6 units TID with meals, Novolog 0-20 units TID with measl Current orders for Inpatient glycemic control: Semglee 22 units daily, Novolog 0-9 units TID with meals, Novolog 0-5 units QHS  Inpatient Diabetes Program Recommendations:    Insulin: In reviewing chart, noted Semglee was NOT GIVEN on 11/06/22 (charted as Patient refused/Not Given). As a result, glucose trends have been in 200's with fasting CBG of 211 mg/dl. Semglee 22 units already given this morning. No recommendations for changes with DM medications at this time.  Thanks, Barnie Alderman, RN, MSN, [place] Diabetes Coordinator Inpatient Diabetes Program [phone] (Team Pager from 8am to [place])

## 2022-11-07 NOTE — Plan of Care (Signed)

## 2022-11-07 NOTE — TOC Benefit Eligibility Note (Signed)
Patient Teacher, English as a foreign language completed.    The patient is currently admitted and upon discharge could be taking linezolid (Zyvox) 600 mg tablets.  The current 14 day co-pay is $17.06.   The patient is insured through [place], [place] Patient Advocate Specialist [place] Patient Advocate Team Direct Number: [phone]  Fax: [phone]

## 2022-11-07 NOTE — Progress Notes (Signed)
[name] to be D/C'd Home per MD order.  Discussed prescriptions and follow up appointments with the patient and his partner [name] . Prescriptions given to patient, medication list explained in detail. Pt verbalized understanding. Pt d/c w/ PICC line for long term antibiotics. Patient and [name] educated on how to care for PICC line. Education also provided by PICC nurse    Allergies as of 11/07/2022   No Known Allergies      Medication List     STOP taking these medications    Apixaban Starter Pack (10mg and 5mg) Commonly known as: ELIQUIS STARTER PACK Replaced by: apixaban 5 MG Tabs tablet   daptomycin  IVPB Commonly known as: CUBICIN   meloxicam 15 MG tablet Commonly known as: MOBIC   sacubitril-valsartan 24-26 MG Commonly known as: ENTRESTO       TAKE these medications    apixaban 5 MG Tabs tablet Commonly known as: ELIQUIS Take 1 tablet (5 mg total) by mouth 2 (two) times daily. Replaces: Apixaban Starter Pack (10mg and 5mg)   aspirin EC 81 MG tablet Take 81 mg by mouth daily.   atorvastatin 40 MG tablet Commonly known as: LIPITOR Take 1 tablet (40 mg total) by mouth daily.   carvedilol 3.125 MG tablet Commonly known as: COREG Take 1 tablet (3.125 mg total) by mouth 2 (two) times daily with a meal. What changed:  medication strength how much to take how to take this when to take this additional instructions   ceftaroline  IVPB Commonly known as: TEFLARO Inject 600 mg into the vein every 8 (eight) hours for 20 days. 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 Labs - Thursday:  BMP and vancomycin trough Please pull PIC at completion of IV antibiotics Fax weekly lab results  promptly to [phone] Method of administration: Mini-Bag Plus / Control-A-Flo (CAF) Method of administration may be  changed at the discretion of home infusion pharmacist based upon assessment of the patient and/or caregiver's ability to self-administer the medication ordered.   cyanocobalamin 1000 MCG tablet Commonly known as: VITAMIN B12 Take 1,000 mcg by mouth every other day.   docusate sodium 100 MG capsule Commonly known as: COLACE Take 2 capsules (200 mg total) by mouth 2 (two) times daily. What changed:  how much to take when to take this reasons to take this   furosemide 40 MG tablet Commonly known as: LASIX Take 1 tablet (40 mg total) by mouth daily as needed for edema or fluid. What changed:  when to take this reasons to take this   glucagon 1 MG injection Inject 1 mg into the skin once as needed (for low blood sugar).   Hyoscyamine Sulfate SL 0.125 MG Subl Commonly known as: Levsin/SL Place 0.125-0.25 mg under the tongue every 4 (four) hours as needed (bladder spasms). 1-2 TABS   insulin aspart 100 UNIT/ML injection Commonly known as: novoLOG Inject 6 Units into the skin 3 (three) times daily with meals.   insulin aspart 100 UNIT/ML injection Commonly known as: novoLOG Inject 0-20 Units into the skin 3 (three) times daily with meals. CBG < 70:Implement Hypoglycemia Standing Orders and refer to Hypoglycemia Standing Orders sidebar report CBG 70 - 120: 0 units  CBG 121 - 150: 3 units  CBG 151 - 200: 4 units  CBG 201 - 250: 7 units  CBG 251 -   300: 11 units  CBG 301 - 350: 15 units  CBG 351 - 400: 20 units  CBG > 400 call MD and obtain STAT lab verification   insulin glargine-yfgn 100 UNIT/ML injection Commonly known as: SEMGLEE Inject 0.28 mLs (28 Units total) into the skin daily.   midodrine 10 MG tablet Commonly known as: PROAMATINE Take 1 tablet (10 mg total) by mouth 2 (two) times daily with a meal.   multivitamin with minerals Tabs tablet Take 1 tablet by mouth daily.   naproxen sodium 220 MG tablet Commonly known as: ALEVE Take 220-440 mg by mouth 2 (two)  times daily as needed (pain/headaches.).   omeprazole 20 MG capsule Commonly known as: PRILOSEC Take 20 mg by mouth 2 (two) times daily before a meal.   ondansetron 4 MG disintegrating tablet Commonly known as: ZOFRAN-ODT Take 1 tablet (4 mg total) by mouth every 8 (eight) hours as needed for nausea or vomiting.   oxyCODONE 5 MG immediate release tablet Commonly known as: Oxy IR/ROXICODONE Take 1 tablet (5 mg total) by mouth every 6 (six) hours as needed for severe pain. What changed:  how much to take when to take this reasons to take this   Uribel 118 MG Caps Take 1 capsule (118 mg total) by mouth every 6 (six) hours as needed (dysuria).   vancomycin  IVPB Inject 1,500 mg into the vein daily for 19 days. 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 Labs - Thursday:  BMP and vancomycin trough Please pull PIC at completion of IV antibiotics Fax weekly lab results  promptly to [phone] Method of administration:Elastomeric Method of administration may be changed at the discretion of the patient and/or caregiver's ability to self-administer the medication ordered.               Durable Medical Equipment  (From admission, onward)           Start     Ordered   11/06/22 1633  For home use only DME Bedside commode  Once       Question:  Patient needs a bedside commode to treat with the following condition  Answer:  Impaired mobility   11/06/22 1632   11/06/22 1631  For home use only DME standard manual wheelchair with seat cushion  Once       Comments: Patient suffers from non weight bearing and infection in knee which impairs their ability to perform daily activities like ADLs  in the home.  A walking aide will not resolve issue with performing activities of daily living. A wheelchair will allow patient to safely perform daily  activities. Patient can safely propel the wheelchair in the home or has a caregiver who can provide assistance. Length of need 6 months,  Accessories: elevating leg rests (ELRs), wheel locks, extensions and anti-tippers. Back cushion   11/06/22 1632              Discharge Care Instructions  (From admission, onward)           Start     Ordered   11/05/22 0000  Change dressing on IV access line weekly and PRN  (Home infusion instructions - [facility] )        11/05/22 1802            Vitals:   11/07/22 0759 11/07/22 1657    BP: (!) 127/55 (!) 145/61  Pulse: 67 (!) 57  Resp: 17 17  Temp: 98.2 F (36.8 C) 97.9 F (36.6 C)  SpO2: 94% 94%   IV catheter discontinued intact. Site without signs and symptoms of complications. Dressing and pressure applied. Pt denies pain at this time. No complaints noted.  An After Visit Summary was printed and given to the patient. Patient escorted via WC, and D/C home via private auto.  Erica Y Morales 

## 2022-11-07 NOTE — Plan of Care (Signed)
  Problem: Education: Goal: Ability to describe self-care measures that may prevent or decrease complications (Diabetes Survival Skills Education) will improve Outcome: Progressing   Problem: Skin Integrity: Goal: Risk for impaired skin integrity will decrease Outcome: Progressing   Problem: Education: Goal: Knowledge of General Education information will improve Description: Including pain rating scale, medication(s)/side effects and non-pharmacologic comfort measures Outcome: Progressing   Problem: Health Behavior/Discharge Planning: Goal: Ability to manage health-related needs will improve Outcome: Progressing

## 2022-11-07 NOTE — Discharge Summary (Addendum)
Physician Discharge Summary  [name]:[id number] DOB: [birth year] DOA: 10/21/2022  PCP: [name], MD  Admit date: 10/21/2022 Discharge date: 11/07/2022  Admitted From: home Disposition:  home  Recommendations for Outpatient Follow-up:  Follow up with PCP in 1-2 weeks Monitor BP, once stabilized can discontinue midodrine  Home Health: RN Equipment/Devices: none  Discharge Condition: stable CODE STATUS: Full code  HPI: Per admitting MD, Patient is a 76 year old male with past medical history of type 1 diabetes mellitus on insulin pump, history of left leg DVT on Eliquis, CAD status post angioplasty, bladder cancer and previous history of MRSA infection who presented to the emergency room on 11/22 and was admitted to the hospitalist service for septic arthritis of left knee. Patient underwent irrigation debridement and partial meniscectomy on 11/23 with repeat irrigation debridement on 11/27 and was placed on IV daptomycin for 6 weeks. He was discharged on 12/4 to skilled nursing. Patient was seen in the orthopedic office on the morning of 12/11 for suture removal and noted to have purulent drainage coming from the wound. Orthopedics took patient to the OR on 12/11 afternoon for repeat arthroscopic debridement and hospitalists were contacted for admission.   Hospital Course / Discharge diagnoses: Principal Problem:   Septic arthritis of knee, left ([name]) Active Problems:   Chronic systolic CHF (congestive heart failure) ([place])   Uncontrolled type 1 diabetes mellitus with hyperglycemia, with long-term current use of insulin ([name])   DVT (deep venous thrombosis) ([name])   HYPERTENSION, BENIGN   Chronic constipation   Overweight (BMI 25.0-29.9)   Hyperlipidemia   Influenza A   Principal problem MRSA septic arthritis of knee, left [facility]) -orthopedic surgery consulted, he is status post repeat laparoscopic debridement. ID consulted, he is on vancomycin and ceftaroline,  end date 1/15.  Home health has been set up   Active problems  Chronic systolic CHF (congestive heart failure) ([place]) -At patient's last hospitalization, 2D echo and TEE confirmed ejection fraction of 35 to 40%.  Appears euvolemic/dry.  Given orthostatic hypotension, overall deconditioning, he was placed on midodrine and Lasix was changed to as needed.  Please follow-up with PCP as soon as possible after discharge, recheck blood pressure, and consider discontinuing midodrine and resumption of daily Lasix based on fluid status and blood pressure trends.  He was also counseled to get a scale and check daily weights as per CHF guidelines Uncontrolled type 1 diabetes mellitus with hyperglycemia, with long-term current use of insulin ([place]) -On long and short-acting insulins.  Most recent A1c 7.13 September 2022 DVT (deep venous thrombosis) ([place]) -On Eliquis HTN, now orthostatic hypotension - patient has been more orthostatic this hospitalization, suspect multifactorial in the setting of dehydration, deconditioning due to prolonged hospitalization.  Started on midodrine, compression stockings.  Influenza A -S/p Tamiflu Hyperlipidemia -On atorvastatin   Discharge Instructions  Discharge Instructions     [facility] pharmacist to adjust dose for Vancomycin, Aminoglycosides and other anti-infective therapies as requested by physician.   Complete by: As directed    [facility] to provide Cath Flo 29m   Complete by: As directed    Administer for PICC line occlusion and as ordered by physician for other access device issues.   Anaphylaxis Kit: Provided to treat any anaphylactic reaction to the medication being provided to the patient if First Dose or when requested by physician   Complete by: As directed    Epinephrine 136mml vial / amp: Administer 0.67m76m0.67ml52mubcutaneously once for moderate to  severe anaphylaxis, nurse to call physician and pharmacy when reaction occurs and call 911  if needed for immediate care   Diphenhydramine 23m/ml IV vial: Administer 25-539mIV/IM PRN for first dose reaction, rash, itching, mild reaction, nurse to call physician and pharmacy when reaction occurs   Sodium Chloride 0.9% NS 50024mV: Administer if needed for hypovolemic blood pressure drop or as ordered by physician after call to physician with anaphylactic reaction   Change dressing on IV access line weekly and PRN   Complete by: As directed    Flush IV access with Sodium Chloride 0.9% and Heparin 10 units/ml or 100 units/ml   Complete by: As directed    Home infusion instructions - [facility]   Complete by: As directed    Instructions: Flush IV access with Sodium Chloride 0.9% and Heparin 10units/ml or 100units/ml   Change dressing on IV access line: Weekly and PRN   Instructions Cath Flo 2mg73mdminister for PICC Line occlusion and as ordered by physician for other access device   [facility] pharmacist to adjust dose for: Vancomycin, Aminoglycosides and other anti-infective therapies as requested by physician   Method of administration may be changed at the discretion of home infusion pharmacist based upon assessment of the patient and/or caregiver's ability to self-administer the medication ordered   Complete by: As directed       Allergies as of 11/07/2022   No Known Allergies      Medication List     STOP taking these medications    Apixaban Starter Pack (10mg38m 5mg) 48mmonly known as: ELIQUIS STARTER PACK Replaced by: apixaban 5 MG Tabs tablet   daptomycin  IVPB Commonly known as: CUBICIN   meloxicam 15 MG tablet Commonly known as: MOBIC   sacubitril-valsartan 24-26 MG Commonly known as: ENTRESTO       TAKE these medications    apixaban 5 MG Tabs tablet Commonly known as: ELIQUIS Take 1 tablet (5 mg total) by mouth 2 (two) times daily. Replaces: Apixaban Starter Pack (10mg a70mmg)   38mirin EC 81 MG tablet Take 81 mg by  mouth daily.   atorvastatin 40 MG tablet Commonly known as: LIPITOR Take 1 tablet (40 mg total) by mouth daily.   carvedilol 3.125 MG tablet Commonly known as: COREG Take 1 tablet (3.125 mg total) by mouth 2 (two) times daily with a meal. What changed:  medication strength how much to take how to take this when to take this additional instructions   ceftaroline  IVPB Commonly known as: TEFLARO Inject 600 mg into the vein every 8 (eight) hours for 20 days. 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 Labs - Thursday:  BMP and vancomycin trough Please pull PIC at completion of IV antibiotics Fax weekly lab results  promptly to [phone] Method of administration: Mini-Bag Plus / Control-A-Flo (CAF) Method of administration may be changed at the discretion of home infusion pharmacist based upon assessment of the patient and/or caregiver's ability to self-administer the medication ordered.   cyanocobalamin 1000 MCG tablet Commonly known as: VITAMIN B12 Take 1,000 mcg by mouth every other day.   docusate sodium 100 MG capsule Commonly known as: COLACE Take 2 capsules (200 mg total) by mouth 2 (two) times daily. What changed:  how much to take when to take this reasons to take this  furosemide 40 MG tablet Commonly known as: LASIX Take 1 tablet (40 mg total) by mouth daily as needed for edema or fluid. What changed:  when to take this reasons to take this   glucagon 1 MG injection Inject 1 mg into the skin once as needed (for low blood sugar).   Hyoscyamine Sulfate SL 0.125 MG Subl Commonly known as: Levsin/SL Place 0.125-0.25 mg under the tongue every 4 (four) hours as needed (bladder spasms). 1-2 TABS   insulin aspart 100 UNIT/ML injection Commonly known as: novoLOG Inject 6 Units into the skin 3 (three) times daily with  meals.   insulin aspart 100 UNIT/ML injection Commonly known as: novoLOG Inject 0-20 Units into the skin 3 (three) times daily with meals. CBG < 70:Implement Hypoglycemia Standing Orders and refer to Hypoglycemia Standing Orders sidebar report CBG 70 - 120: 0 units  CBG 121 - 150: 3 units  CBG 151 - 200: 4 units  CBG 201 - 250: 7 units  CBG 251 - 300: 11 units  CBG 301 - 350: 15 units  CBG 351 - 400: 20 units  CBG > 400 call MD and obtain STAT lab verification   insulin glargine-yfgn 100 UNIT/ML injection Commonly known as: SEMGLEE Inject 0.28 mLs (28 Units total) into the skin daily.   midodrine 10 MG tablet Commonly known as: PROAMATINE Take 1 tablet (10 mg total) by mouth 2 (two) times daily with a meal.   multivitamin with minerals Tabs tablet Take 1 tablet by mouth daily.   naproxen sodium 220 MG tablet Commonly known as: ALEVE Take 220-440 mg by mouth 2 (two) times daily as needed (pain/headaches.).   omeprazole 20 MG capsule Commonly known as: PRILOSEC Take 20 mg by mouth 2 (two) times daily before a meal.   ondansetron 4 MG disintegrating tablet Commonly known as: ZOFRAN-ODT Take 1 tablet (4 mg total) by mouth every 8 (eight) hours as needed for nausea or vomiting.   oxyCODONE 5 MG immediate release tablet Commonly known as: Oxy IR/ROXICODONE Take 1 tablet (5 mg total) by mouth every 6 (six) hours as needed for severe pain. What changed:  how much to take when to take this reasons to take this   Uribel 118 MG Caps Take 1 capsule (118 mg total) by mouth every 6 (six) hours as needed (dysuria).   vancomycin  IVPB Inject 1,500 mg into the vein daily for 19 days. 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 Labs - Thursday:  BMP and vancomycin trough Please pull PIC at completion of IV antibiotics Fax weekly lab results   promptly to [phone] Method of administration:Elastomeric Method of administration may be changed at the discretion of the patient and/or caregiver's ability to self-administer the medication ordered.               Durable Medical Equipment  (From admission, onward)           Start     Ordered   11/06/22 1633  For home use only DME Bedside commode  Once       Question:  Patient needs a bedside commode to treat with the following condition  Answer:  Impaired mobility   11/06/22 1632   11/06/22 1631  For home use only DME standard manual wheelchair with seat cushion  Once       Comments: Patient  suffers from non weight bearing and infection in knee which impairs their ability to perform daily activities like ADLs  in the home.  A walking aide will not resolve issue with performing activities of daily living. A wheelchair will allow patient to safely perform daily activities. Patient can safely propel the wheelchair in the home or has a caregiver who can provide assistance. Length of need 6 months,  Accessories: elevating leg rests (ELRs), wheel locks, extensions and anti-tippers. Back cushion   11/06/22 1632              Discharge Care Instructions  (From admission, onward)           Start     Ordered   11/05/22 0000  Change dressing on IV access line weekly and PRN  (Home infusion instructions - [facility] )        11/05/22 1802            Contact information for follow-up providers     Lattie Corns, [name] Follow up on 11/05/2022.   Specialty: Physician Assistant Why: Call to schedule an appointment for 11/05/22 for suture removal and repeat skin check. Contact information: [place] [address] [phone]              Contact information for after-discharge care     [place] [facility] Preferred SNF .   Service: Skilled  Nursing Contact information: [place] [phone]                     Consultations: none  Procedures/Studies:  CT Angio Chest Pulmonary Embolism (PE) W or WO Contrast  Result Date: 10/26/2022 CLINICAL DATA:  Hypoxia, known DVT. EXAM: CT ANGIOGRAPHY CHEST WITH CONTRAST TECHNIQUE: Multidetector CT imaging of the chest was performed using the standard protocol during bolus administration of intravenous contrast. Multiplanar CT image reconstructions and MIPs were obtained to evaluate the vascular anatomy. RADIATION DOSE REDUCTION: This exam was performed according to the departmental dose-optimization program which includes automated exposure control, adjustment of the mA and/or kV according to patient size and/or use of iterative reconstruction technique. CONTRAST:  158m OMNIPAQUE IOHEXOL 350 MG/ML SOLN COMPARISON:  CT chest 04/22/2018 FINDINGS: Cardiovascular: Satisfactory opacification of the pulmonary arteries to the segmental level. No evidence of pulmonary embolism. Normal heart size. No pericardial effusion. LAD coronary artery stent. Mediastinum/Nodes: No enlarged mediastinal, hilar, or axillary lymph nodes. Thyroid gland, trachea, and esophagus demonstrate no significant findings. Lungs/Pleura: Mild mixed centrilobular and paraseptal emphysema. Diffuse mild bronchial wall thickening with mucoid impaction in the lower lobes. A 0.5 cm subpleural nodule in the left lower lobe and 0.7 cm subpleural nodule at the left lung base are stable compared to 04/22/2018 and therefore considered benign (series 5, image 94, 109). A 0.3 cm subpleural nodule in the right lower lobe is also unchanged compared to 04/22/2018 and also considered benign (series 5, image 80). No pleural effusion or pneumothorax. Upper Abdomen: No acute abnormality. Reflux of intravenous contrast is noted in the hepatic veins. Musculoskeletal: No chest wall abnormality. No  acute or significant osseous findings. Review of the MIP images confirms the above findings. IMPRESSION: 1. No pulmonary embolism. 2. Diffuse mild bronchial wall thickening with mucoid impaction in the lower lobes, consistent with infectious versus inflammatory bronchitis. 3. Mild emphysema. 4. Stable benign bilateral pulmonary nodules. No additional dedicated follow-up  imaging is indicated. 5. Reflux of intravenous contrast in the hepatic veins, suggestive of increased right heart pressure. Emphysema (ICD10-J43.9). Electronically Signed   By: Ileana Roup M.D.   On: 10/26/2022 18:48   DG Chest Port 1 View  Result Date: 10/25/2022 CLINICAL DATA:  Fever, cough EXAM: PORTABLE CHEST 1 VIEW COMPARISON:  Previous chest radiograph done on 09/11/2007, CT done on 04/22/2018 FINDINGS: Transverse diameter of heart is in the upper limits of normal. There is possible coronary artery stent. PICC line has been placed through the right upper extremity with tip of the line in superior vena cava. Increased interstitial markings are seen in parahilar regions, more so on the right side. There is no focal consolidation in the peripheral lung fields. There is no pleural effusion or pneumothorax. IMPRESSION: Increased interstitial markings are seen in parahilar regions, more so on the right side suggesting asymmetric interstitial edema or interstitial pneumonia. There is no pleural effusion or pneumothorax. Electronically Signed   By: Elmer Picker M.D.   On: 10/25/2022 19:47   US Venous Img Lower Unilateral Left (DVT)  Result Date: 10/13/2022 CLINICAL DATA:  Left lower extremity edema EXAM: LEFT LOWER EXTREMITY VENOUS DOPPLER ULTRASOUND TECHNIQUE: Gray-scale sonography with compression, as well as color and duplex ultrasound, were performed to evaluate the deep venous system(s) from the level of the common femoral vein through the popliteal and proximal calf veins. COMPARISON:  None Available. FINDINGS: VENOUS Normal  compressibility of the common femoral, superficial femoral, as well as the visualized calf veins. Visualized portions of profunda femoral vein and great saphenous vein unremarkable. No filling defects to suggest DVT on grayscale or color Doppler imaging. Doppler waveforms show normal direction of venous flow, normal respiratory plasticity and response to augmentation. Limited views of the contralateral common femoral vein are unremarkable. OTHER None. Limitations: Unable to visualize the popliteal vein secondary to bandages which could not be removed. IMPRESSION: No evidence of deep venous thrombosis within the visualized venous structures. The popliteal vein could not be visualized due to obscuring bandages. Electronically Signed   By: Jacqulynn Cadet M.D.   On: 10/13/2022 11:52   MR KNEE LEFT W WO CONTRAST  Result Date: 10/10/2022 CLINICAL DATA:  Septic arthritis suspected. Joint effusion and synovial thickening on previous MRI. Patient underwent arthroscopic irrigation and debridement with extensive partial synovectomy and partial medial meniscectomy on 10/03/2022. Subsequent arthroscopic irrigation and debridement 10/07/2022 with cultures growing MRSA. EXAM: MRI OF THE LEFT KNEE WITHOUT AND WITH CONTRAST TECHNIQUE: Multiplanar, multisequence MR imaging of the left knee was performed both before and after administration of intravenous contrast. CONTRAST:  58m GADAVIST GADOBUTROL 1 MMOL/ML IV SOLN COMPARISON:  MRI 10/03/2022.  Radiographs 10/02/2022. FINDINGS: Bones/Joint/Cartilage Interval increased serpiginous T2 marrow hyperintensity posteriorly in the medial femoral condyle and medial tibial plateau without significant enhancement. This is not typical for osteomyelitis, and could reflect bone infarcts. No cortical destruction or suspicious marrow enhancement identified. There is low-level periarticular edema which is nonspecific. A small amount of air within the knee joint is attributed to the recent  surgery. Unchanged moderate size knee joint effusion with diffuse synovial enhancement. Ligaments The cruciate and collateral ligaments remain intact. Postsurgical changes are present within the medial meniscus. Muscles and Tendons Intact extensor mechanism. No intramuscular fluid collection or abnormal enhancement. Soft tissue Prepatellar subcutaneous edema without focal fluid collection. Stable small Baker cyst with associated synovial enhancement. IMPRESSION: 1. Unchanged moderate size knee joint effusion with diffuse synovial enhancement, consistent with known septic arthritis. 2. Interval  increased serpiginous T2 hyperintensity posteriorly in the medial femoral condyle and medial tibial plateau without significant enhancement. This is not typical for osteomyelitis, and could reflect bone infarcts. Early osteomyelitis is not completely excluded, and continued follow-up should be considered. 3. No evidence of soft tissue abscess. 4. Postsurgical changes in the medial meniscus. Electronically Signed   By: Richardean Sale M.D.   On: 10/10/2022 11:23   [place] EKG SITE RITE  Result Date: 10/09/2022 If Site Rite image not attached, placement could not be confirmed due to current cardiac rhythm.    Subjective: - no chest pain, shortness of breath, no abdominal pain, nausea or vomiting.   Discharge Exam: BP (!) 127/55 (BP Location: Left Arm)   Pulse 67   Temp 98.2 F (36.8 C) (Oral)   Resp 17   Ht _0  (1.753 m)   Wt 86.2 kg   SpO2 94%   BMI 28.06 kg/m   General: Pt is alert, awake, not in acute distress Cardiovascular: RRR, S1/S2 +, no rubs, no gallops Respiratory: CTA bilaterally, no wheezing, no rhonchi Abdominal: Soft, NT, ND, bowel sounds + Extremities: no edema, no cyanosis    The results of significant diagnostics from this hospitalization (including imaging, microbiology, ancillary and laboratory) are listed below for reference.     Microbiology: No results found for this or any  previous visit (from the past 240 hour(s)).    Labs: Basic Metabolic Panel: Recent Labs  Lab 11/02/22 0627 11/03/22 0549 11/04/22 0603 11/05/22 1305 11/06/22 0707 11/07/22 0541  NA 141  --   --   --   --  137  K 3.9  --   --   --   --  3.9  CL 103  --   --   --   --  104  CO2 30  --   --   --   --  24  GLUCOSE 109*  --   --   --   --  202*  BUN 17  --   --   --   --  15  CREATININE 0.96 0.88 0.97 1.00 1.00 0.98  CALCIUM 8.1*  --   --   --   --  8.0*  MG 2.2  --   --   --   --  1.9  PHOS 2.6  --   --   --   --   --    Liver Function Tests: No results for input(s): "AST", "ALT", "ALKPHOS", "BILITOT", "PROT", "ALBUMIN" in the last 168 hours. CBC: Recent Labs  Lab 11/02/22 0627 11/07/22 0541  WBC 10.5 10.8*  HGB 9.5* 10.0*  HCT 29.5* 32.9*  MCV 88.6 92.7  PLT 399 312   CBG: Recent Labs  Lab 11/06/22 1521 11/06/22 2111 11/06/22 2249 11/07/22 0802 11/07/22 1140  GLUCAP 245* 222* 213* 211* 190*   Hgb A1c No results for input(s): "HGBA1C" in the last 72 hours. Lipid Profile No results for input(s): "CHOL", "HDL", "LDLCALC", "TRIG", "CHOLHDL", "LDLDIRECT" in the last 72 hours. Thyroid function studies No results for input(s): "TSH", "T4TOTAL", "T3FREE", "THYROIDAB" in the last 72 hours.  Invalid input(s): "FREET3" Urinalysis    Component Value Date/Time   COLORURINE YELLOW (A) 11/06/2022 1705   APPEARANCEUR HAZY (A) 11/06/2022 1705   APPEARANCEUR Clear 06/08/2015 1343   LABSPEC 1.011 11/06/2022 1705   PHURINE 6.0 11/06/2022 1705   GLUCOSEU NEGATIVE 11/06/2022 1705   HGBUR MODERATE (A) 11/06/2022 [place] NEGATIVE 11/06/2022 1705  BILIRUBINUR Negative 06/08/2015 [place] 11/06/2022 1705   PROTEINUR 30 (A) 11/06/2022 1705   NITRITE NEGATIVE 11/06/2022 1705   LEUKOCYTESUR TRACE (A) 11/06/2022 1705    FURTHER DISCHARGE INSTRUCTIONS:   Get Medicines reviewed and adjusted: Please take all your medications with you for your next  visit with your Primary MD   Laboratory/radiological data: Please request your Primary MD to go over all hospital tests and procedure/radiological results at the follow up, please ask your Primary MD to get all Hospital records sent to his/her office.   In some cases, they will be blood work, cultures and biopsy results pending at the time of your discharge. Please request that your primary care M.D. goes through all the records of your hospital data and follows up on these results.   Also Note the following: If you experience worsening of your admission symptoms, develop shortness of breath, life threatening emergency, suicidal or homicidal thoughts you must seek medical attention immediately by calling 911 or calling your MD immediately  if symptoms less severe.   You must read complete instructions/literature along with all the possible adverse reactions/side effects for all the Medicines you take and that have been prescribed to you. Take any new Medicines after you have completely understood and accpet all the possible adverse reactions/side effects.    Do not drive when taking Pain medications or sleeping medications (Benzodaizepines)   Do not take more than prescribed Pain, Sleep and Anxiety Medications. It is not advisable to combine anxiety,sleep and pain medications without talking with your primary care practitioner   Special Instructions: If you have smoked or chewed Tobacco  in the last 2 yrs please stop smoking, stop any regular Alcohol  and or any Recreational drug use.   Wear Seat belts while driving.   Please note: You were cared for by a hospitalist during your hospital stay. Once you are discharged, your primary care physician will handle any further medical issues. Please note that NO REFILLS for any discharge medications will be authorized once you are discharged, as it is imperative that you return to your primary care physician (or establish a relationship with a primary  care physician if you do not have one) for your post hospital discharge needs so that they can reassess your need for medications and monitor your lab values.  Time coordinating discharge: 40 minutes  SIGNED:  Marzetta Board, MD, PhD 11/07/2022, 3:21 PM

## 2022-11-08 DIAGNOSIS — T8143XA Infection following a procedure, organ and space surgical site, initial encounter: Secondary | ICD-10-CM | POA: Diagnosis not present

## 2022-11-08 DIAGNOSIS — B9562 Methicillin resistant Staphylococcus aureus infection as the cause of diseases classified elsewhere: Secondary | ICD-10-CM | POA: Diagnosis not present

## 2022-11-08 DIAGNOSIS — M00062 Staphylococcal arthritis, left knee: Secondary | ICD-10-CM | POA: Diagnosis not present

## 2022-11-08 DIAGNOSIS — L89610 Pressure ulcer of right heel, unstageable: Secondary | ICD-10-CM | POA: Diagnosis not present

## 2022-11-08 DIAGNOSIS — J1089 Influenza due to other identified influenza virus with other manifestations: Secondary | ICD-10-CM | POA: Diagnosis not present

## 2022-11-08 DIAGNOSIS — C679 Malignant neoplasm of bladder, unspecified: Secondary | ICD-10-CM | POA: Diagnosis not present

## 2022-11-08 DIAGNOSIS — R7881 Bacteremia: Secondary | ICD-10-CM | POA: Diagnosis not present

## 2022-11-08 DIAGNOSIS — L89620 Pressure ulcer of left heel, unstageable: Secondary | ICD-10-CM | POA: Diagnosis not present

## 2022-11-08 DIAGNOSIS — E1065 Type 1 diabetes mellitus with hyperglycemia: Secondary | ICD-10-CM | POA: Diagnosis not present

## 2022-11-12 ENCOUNTER — Telehealth: Payer: Self-pay | Admitting: Cardiovascular Disease

## 2022-11-12 ENCOUNTER — Other Ambulatory Visit: Payer: Self-pay

## 2022-11-12 ENCOUNTER — Telehealth: Payer: Self-pay

## 2022-11-12 DIAGNOSIS — M00862 Arthritis due to other bacteria, left knee: Secondary | ICD-10-CM | POA: Diagnosis not present

## 2022-11-12 DIAGNOSIS — R300 Dysuria: Secondary | ICD-10-CM | POA: Diagnosis not present

## 2022-11-12 MED ORDER — ATORVASTATIN CALCIUM 40 MG PO TABS: 40.0000 mg | ORAL_TABLET | Freq: Every day | ORAL | 0 refills | Status: DC

## 2022-11-12 NOTE — Telephone Encounter (Signed)
Please schedule 12 month F/U for further refills. Thank you!

## 2022-11-12 NOTE — Telephone Encounter (Signed)
LVM to schedule appt

## 2022-11-12 NOTE — Telephone Encounter (Signed)
Patient's partner called, states they will run out of IV medication tomorrow. Advised him to call [facility] to request shipment of medication as end date is not until 11/25/22. Provided him with appointment information for this week.   Beryle Flock, RN

## 2022-11-12 NOTE — Telephone Encounter (Signed)
*  STAT* If patient is at the pharmacy, call can be transferred to refill team.   1. Which medications need to be refilled? (please list name of each medication and dose if known)  atorvastatin (LIPITOR) 40 MG tablet  2. Which pharmacy/location (including street and city if local pharmacy) is medication to be sent to? [facility] [place], [address] AT [facility] OF [address]  3. Do they need a 30 day or 90 day supply?  30 day supply

## 2022-11-14 ENCOUNTER — Other Ambulatory Visit: Payer: Self-pay | Admitting: Infectious Diseases

## 2022-11-14 ENCOUNTER — Ambulatory Visit (HOSPITAL_BASED_OUTPATIENT_CLINIC_OR_DEPARTMENT_OTHER): Payer: Medicare HMO | Admitting: Infectious Diseases

## 2022-11-14 ENCOUNTER — Telehealth: Payer: Self-pay | Admitting: *Deleted

## 2022-11-14 ENCOUNTER — Ambulatory Visit
Admission: RE | Admit: 2022-11-14 | Discharge: 2022-11-14 | Disposition: A | Discharge status: Home / Self Care | Payer: Medicare HMO | Source: Ambulatory Visit | Attending: Infectious Diseases | Admitting: Infectious Diseases

## 2022-11-14 ENCOUNTER — Ambulatory Visit
Admission: RE | Admit: 2022-11-14 | Discharge: 2022-11-14 | Disposition: A | Discharge status: Home / Self Care | Payer: Self-pay | Source: Ambulatory Visit | Attending: Infectious Diseases | Admitting: Infectious Diseases

## 2022-11-14 ENCOUNTER — Encounter: Payer: Self-pay | Admitting: Infectious Diseases

## 2022-11-14 VITALS — BP 120/69 | HR 73 | Temp 96.8°F

## 2022-11-14 VITALS — BP 148/59 | HR 78 | Temp 96.5°F | Resp 16

## 2022-11-14 DIAGNOSIS — R61 Generalized hyperhidrosis: Secondary | ICD-10-CM

## 2022-11-14 DIAGNOSIS — K5909 Other constipation: Secondary | ICD-10-CM

## 2022-11-14 DIAGNOSIS — I82621 Acute embolism and thrombosis of deep veins of right upper extremity: Secondary | ICD-10-CM | POA: Insufficient documentation

## 2022-11-14 DIAGNOSIS — E663 Overweight: Secondary | ICD-10-CM

## 2022-11-14 DIAGNOSIS — I5022 Chronic systolic (congestive) heart failure: Secondary | ICD-10-CM

## 2022-11-14 DIAGNOSIS — M009 Pyogenic arthritis, unspecified: Secondary | ICD-10-CM | POA: Diagnosis not present

## 2022-11-14 DIAGNOSIS — E109 Type 1 diabetes mellitus without complications: Secondary | ICD-10-CM

## 2022-11-14 DIAGNOSIS — I10 Essential (primary) hypertension: Secondary | ICD-10-CM

## 2022-11-14 DIAGNOSIS — M00062 Staphylococcal arthritis, left knee: Secondary | ICD-10-CM

## 2022-11-14 DIAGNOSIS — D75838 Other thrombocytosis: Secondary | ICD-10-CM

## 2022-11-14 DIAGNOSIS — I5041 Acute combined systolic (congestive) and diastolic (congestive) heart failure: Secondary | ICD-10-CM

## 2022-11-14 DIAGNOSIS — I2510 Atherosclerotic heart disease of native coronary artery without angina pectoris: Secondary | ICD-10-CM

## 2022-11-14 DIAGNOSIS — B9562 Methicillin resistant Staphylococcus aureus infection as the cause of diseases classified elsewhere: Secondary | ICD-10-CM

## 2022-11-14 DIAGNOSIS — J101 Influenza due to other identified influenza virus with other respiratory manifestations: Secondary | ICD-10-CM

## 2022-11-14 DIAGNOSIS — I82611 Acute embolism and thrombosis of superficial veins of right upper extremity: Secondary | ICD-10-CM | POA: Diagnosis not present

## 2022-11-14 DIAGNOSIS — I951 Orthostatic hypotension: Secondary | ICD-10-CM

## 2022-11-14 DIAGNOSIS — E1065 Type 1 diabetes mellitus with hyperglycemia: Secondary | ICD-10-CM

## 2022-11-14 MED ORDER — SULFAMETHOXAZOLE-TRIMETHOPRIM 800-160 MG PO TABS: 1.0000 | ORAL_TABLET | Freq: Two times a day (BID) | ORAL | 0 refills | Status: DC

## 2022-11-14 NOTE — Patient Instructions (Addendum)
You are here for follow up of left knee MRSA infection You are on Iv antibiotics 1) Continue Iv vancomycin once a day Continue Ceftaroline three times a day until 11/25/22  2) Labs need to be  drawn weekly as directed 3) Hb 9.8- you are anemic 4) Dizziness could be from anemia, low BP, deconditioning and poor nutrition Please follow up with Dr.Hedrick Eat high iron/protein food Physical therapy   5) Burning urine- you dont have an infection- so stop ciprofloxacin  Knudsen cranberry concentrate - 1 ounce of that + 8-10 ounces of water can be taken twice a day- dont drink straight  6) you have swellin gof the rt extremity- we need to get Ultrasound to rule out deep vein thrombosis

## 2022-11-14 NOTE — Progress Notes (Signed)
Peripherally Inserted Central Catheter Placement  The IV Nurse has discussed with the patient and/or persons authorized to consent for the patient, the purpose of this procedure and the potential benefits and risks involved with this procedure.  The benefits include less needle sticks, lab draws from the catheter, and the patient may be discharged home with the catheter. Risks include, but not limited to, infection, bleeding, blood clot (thrombus formation), and puncture of an artery; nerve damage and irregular heartbeat and possibility to perform a PICC exchange if needed/ordered by physician.  Alternatives to this procedure were also discussed.  Bard Power PICC patient education guide, fact sheet on infection prevention and patient information card has been provided to patient /or left at bedside.    PICC Placement Documentation  PICC Single Lumen 47/65/46 Right Basilic 39 cm 0 cm (Active)  Indication for Insertion or Continuance of Line Prolonged intravenous therapies 11/14/22 1700  Exposed Catheter (cm) 0 cm 11/14/22 1700  Site Assessment Clean, Dry, Intact 11/07/22 1000  Line Status Flushed;Saline locked;No blood return 11/14/22 1700  Dressing Type Transparent;Securing device 11/14/22 1700  Dressing Status Antimicrobial disc in place;Clean, Dry, Intact 11/14/22 1700  Safety Lock Not Applicable 50/35/46 5681  Line Care Connections checked and tightened 11/14/22 1700  Line Adjustment (NICU/IV Team Only) No 11/14/22 1700  Dressing Intervention Other (Comment) 11/14/22 1700  Dressing Change Due 11/11/22 11/04/22 1824     PICC Single Lumen 11/14/22 Left Brachial 41 cm 0 cm (Active)  Indication for Insertion or Continuance of Line Administration of hyperosmolar/irritating solutions (i.e. TPN, Vancomycin, etc.);Home intravenous therapies (PICC only) 11/14/22 1837  Exposed Catheter (cm) 0 cm 11/14/22 1837  Site Assessment Clean, Dry, Intact 11/14/22 1837  Line Status Flushed;Saline locked;Blood  return noted 11/14/22 1837  Dressing Type Transparent;Securing device 11/14/22 1837  Dressing Status Antimicrobial disc in place;Clean, Dry, Intact 11/14/22 1837  Safety Lock Not Applicable 27/51/70 0174  Line Care Connections checked and tightened 11/14/22 1837  Line Adjustment (NICU/IV Team Only) No 11/14/22 1837  Dressing Intervention New dressing 11/14/22 1837  Dressing Change Due 11/21/22 11/14/22 [place] 11/14/2022, 6:39 PM
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
## 2022-11-14 NOTE — Patient Outreach (Signed)
  Care Coordination [facility] Note Transition Care Management Unsuccessful Follow-up Telephone Call  Date of discharge and from where:  [facility]  [phone]      Septic Knee  Attempts:  1st Attempt  Reason for unsuccessful TCM follow-up call:  Left voice message  [place] [facility] [phone]

## 2022-11-18 ENCOUNTER — Telehealth: Payer: Self-pay

## 2022-11-18 NOTE — Patient Outreach (Signed)
  Care Coordination [facility] Note Transition Care Management Unsuccessful Follow-up Telephone Call  Date of discharge and from where:  [facility] 11/07/22- Septic Knee  Attempts:  2nd Attempt  Reason for unsuccessful TCM follow-up call:  No answer/busy  Johnney Killian, RN, BSN, CCM Care Management Coordinator [facility] Phone: [phone]: [phone]

## 2022-11-19 ENCOUNTER — Telehealth: Payer: Self-pay | Admitting: *Deleted

## 2022-11-19 DIAGNOSIS — R7881 Bacteremia: Secondary | ICD-10-CM | POA: Diagnosis not present

## 2022-11-19 DIAGNOSIS — B9562 Methicillin resistant Staphylococcus aureus infection as the cause of diseases classified elsewhere: Secondary | ICD-10-CM | POA: Diagnosis not present

## 2022-11-19 DIAGNOSIS — T8143XA Infection following a procedure, organ and space surgical site, initial encounter: Secondary | ICD-10-CM | POA: Diagnosis not present

## 2022-11-19 DIAGNOSIS — M00021 Staphylococcal arthritis, right elbow: Secondary | ICD-10-CM | POA: Diagnosis not present

## 2022-11-19 DIAGNOSIS — M00062 Staphylococcal arthritis, left knee: Secondary | ICD-10-CM | POA: Diagnosis not present

## 2022-11-19 DIAGNOSIS — C679 Malignant neoplasm of bladder, unspecified: Secondary | ICD-10-CM | POA: Diagnosis not present

## 2022-11-19 DIAGNOSIS — L89610 Pressure ulcer of right heel, unstageable: Secondary | ICD-10-CM | POA: Diagnosis not present

## 2022-11-19 DIAGNOSIS — E1065 Type 1 diabetes mellitus with hyperglycemia: Secondary | ICD-10-CM | POA: Diagnosis not present

## 2022-11-19 NOTE — Patient Outreach (Signed)
  Care Coordination [facility] Note Transition Care Management Unsuccessful Follow-up Telephone Call  Date of discharge and from where:  [facility] [phone] Septic Knee  Attempts:  3rd Attempt  Reason for unsuccessful TCM follow-up call:  No answer/busy  [place] Management [phone]

## 2022-11-20 ENCOUNTER — Other Ambulatory Visit: Payer: Self-pay | Admitting: Infectious Diseases

## 2022-11-20 ENCOUNTER — Telehealth: Payer: Self-pay

## 2022-11-20 ENCOUNTER — Telehealth: Payer: Self-pay | Admitting: Pharmacist

## 2022-11-20 DIAGNOSIS — M009 Pyogenic arthritis, unspecified: Secondary | ICD-10-CM | POA: Diagnosis not present

## 2022-11-20 MED ORDER — DOXYCYCLINE MONOHYDRATE 100 MG PO TABS: 100.0000 mg | ORAL_TABLET | Freq: Two times a day (BID) | ORAL | 0 refills | Status: DC

## 2022-11-20 NOTE — Progress Notes (Signed)
Critical lab value-Pt has leucopenia- so stopping ceftaroline and vanco- will repeat cbc tomorrow-will bridge with doxy '100mg'$  PO BID Informed patient and partner

## 2022-11-20 NOTE — Telephone Encounter (Signed)
No, we didn't call him but I did call [facility]. Thanks!

## 2022-11-20 NOTE — Telephone Encounter (Signed)
Received STAT labs on patient. He is currently on IV vanc + ceftaroline for MRSA septic knee until 11/25/22. WBC on 11/12/22 was 5.7 but yesterday it was 1.6. Discussed with Dr. Delaine Lame. Will hold antibiotics and repeat CBC tomorrow. Called [facility] and spoke to pharmacist, Amy, and gave orders to repeat CBC. They have already advised the patient to hold. Will review CBC after it has resulted and discuss with Dr. Delaine Lame regarding next steps.  Armand Preast L. Nneoma Harral, PharmD, BCIDP, AAHIVP, CPP Clinical Pharmacist Practitioner Infectious [facility] 11/20/2022, 2:35 PM

## 2022-11-20 NOTE — Telephone Encounter (Signed)
Patient's caregiver called, states [name] received a phone call from someone telling him to stop two of his medications due to increasing WBC. [name] was not present for the call and has no other details.   Beryle Flock, RN

## 2022-11-21 ENCOUNTER — Inpatient Hospital Stay: Payer: Medicare HMO | Admitting: Infectious Diseases

## 2022-11-21 DIAGNOSIS — M00219 Other streptococcal arthritis, unspecified shoulder: Secondary | ICD-10-CM | POA: Diagnosis not present

## 2022-11-25 ENCOUNTER — Telehealth: Payer: Self-pay

## 2022-11-25 NOTE — Telephone Encounter (Signed)
Per Dr. Delaine Lame ok for patient to end IV abx on 11/25/22 and picc can be removed after last dose. Patient will also need labs drawn before picc removal (h ESR/CRP/CBC/CMP). I have sent a community message to [employer] relaying all orders.   Patient will start doxy po '100mg'$  BID an prescription was sent last week. Dr. Delaine Lame also reached out to the patient and gave all information to the patient and left a message for caregiver. Patient will cancel appointment with Dr. Delaine Lame tomorrow as well due to having a follow up with wound care at the same time. Clifford Coudriet T Brooks Sailors

## 2022-11-26 ENCOUNTER — Encounter: Payer: Medicare HMO | Attending: Physician Assistant | Admitting: Physician Assistant

## 2022-11-26 ENCOUNTER — Telehealth: Payer: Self-pay | Admitting: Cardiovascular Disease

## 2022-11-26 ENCOUNTER — Ambulatory Visit: Payer: Medicare HMO | Admitting: Infectious Diseases

## 2022-11-26 DIAGNOSIS — L89610 Pressure ulcer of right heel, unstageable: Secondary | ICD-10-CM | POA: Insufficient documentation

## 2022-11-26 DIAGNOSIS — L98412 Non-pressure chronic ulcer of buttock with fat layer exposed: Secondary | ICD-10-CM | POA: Diagnosis not present

## 2022-11-26 DIAGNOSIS — L89620 Pressure ulcer of left heel, unstageable: Secondary | ICD-10-CM | POA: Diagnosis not present

## 2022-11-26 DIAGNOSIS — M00062 Staphylococcal arthritis, left knee: Secondary | ICD-10-CM | POA: Diagnosis not present

## 2022-11-26 DIAGNOSIS — E1051 Type 1 diabetes mellitus with diabetic peripheral angiopathy without gangrene: Secondary | ICD-10-CM | POA: Insufficient documentation

## 2022-11-26 DIAGNOSIS — T8143XA Infection following a procedure, organ and space surgical site, initial encounter: Secondary | ICD-10-CM | POA: Diagnosis not present

## 2022-11-26 DIAGNOSIS — I5042 Chronic combined systolic (congestive) and diastolic (congestive) heart failure: Secondary | ICD-10-CM | POA: Diagnosis not present

## 2022-11-26 DIAGNOSIS — B9562 Methicillin resistant Staphylococcus aureus infection as the cause of diseases classified elsewhere: Secondary | ICD-10-CM | POA: Diagnosis not present

## 2022-11-26 DIAGNOSIS — C679 Malignant neoplasm of bladder, unspecified: Secondary | ICD-10-CM | POA: Diagnosis not present

## 2022-11-26 DIAGNOSIS — E1065 Type 1 diabetes mellitus with hyperglycemia: Secondary | ICD-10-CM | POA: Diagnosis not present

## 2022-11-26 DIAGNOSIS — E10622 Type 1 diabetes mellitus with other skin ulcer: Secondary | ICD-10-CM | POA: Diagnosis not present

## 2022-11-26 DIAGNOSIS — I110 Hypertensive heart disease with heart failure: Secondary | ICD-10-CM | POA: Diagnosis not present

## 2022-11-26 DIAGNOSIS — R7881 Bacteremia: Secondary | ICD-10-CM | POA: Diagnosis not present

## 2022-11-26 DIAGNOSIS — I2510 Atherosclerotic heart disease of native coronary artery without angina pectoris: Secondary | ICD-10-CM | POA: Insufficient documentation

## 2022-11-26 DIAGNOSIS — M00019 Staphylococcal arthritis, unspecified shoulder: Secondary | ICD-10-CM | POA: Diagnosis not present

## 2022-11-26 NOTE — Progress Notes (Signed)
An arthroscopic debridement of the knee was performed.  I would call it an excisional debridement of primarily devitalized synovial tissue as we removed a layer of synovium/rind from the inside of the knee.  Since was done arthroscopically, the depth of debridement and size of the wound are not applicable in this case.  Thank you!

## 2022-11-26 NOTE — Telephone Encounter (Signed)
Pt c/o swelling: STAT is pt has developed SOB within 24 hours  If swelling, where is the swelling located? Feet, legs, and arms   How much weight have you gained and in what time span? Not 100% sure   Have you gained 3 pounds in a day or 5 pounds in a week? " I think 3lbs in a day"   Do you have a log of your daily weights (if so, list)? Did not take   Are you currently taking a fluid pill? no  Are you currently SOB? no  Have you traveled recently? No   Pt states he went to see one of his other doctors at wound care. He was told there that he was retaining fluid in his feet, legs, and arms and was told to contain Dr. Rockey Situ about starting him back on a fluid pill.

## 2022-11-26 NOTE — Telephone Encounter (Addendum)
Pt and caregiver called stating pt is experiencing generalized edema that's more so in hip area. She report pt was was recently hospital in Nov. At that time she report pt was hypotensive and recommendations were to stop lasix. However, medication list lasix dose as PRN. Caregiver stated she was unaware and pt does not have a current prescription. She report pt has been bed bound since discharged but now transferring from bed to wheel. She reports since pt has been more mobile, they've notice an increase in swelling. She stated pt is not experiencing SOB and does not weigh himself. She also report BP has been WNL.  Appointment scheduled for 1/17 with Dr. Rockey Situ for further evaluations.

## 2022-11-27 ENCOUNTER — Encounter: Payer: Self-pay | Admitting: Cardiovascular Disease

## 2022-11-27 ENCOUNTER — Ambulatory Visit: Payer: Medicare HMO | Attending: Cardiovascular Disease | Admitting: Cardiovascular Disease

## 2022-11-27 VITALS — BP 130/60 | HR 71 | Ht 69.0 in | Wt 187.0 lb

## 2022-11-27 DIAGNOSIS — I25118 Atherosclerotic heart disease of native coronary artery with other forms of angina pectoris: Secondary | ICD-10-CM | POA: Diagnosis not present

## 2022-11-27 MED ORDER — FUROSEMIDE 20 MG PO TABS: 20.0000 mg | ORAL_TABLET | Freq: Every day | ORAL | 1 refills | Status: DC | PRN

## 2022-11-27 MED ORDER — POTASSIUM CHLORIDE CRYS ER 20 MEQ PO TBCR: 20.0000 meq | EXTENDED_RELEASE_TABLET | Freq: Every day | ORAL | 1 refills | Status: DC | PRN

## 2022-11-27 MED ORDER — MIDODRINE HCL 10 MG PO TABS: 5.0000 mg | ORAL_TABLET | Freq: Two times a day (BID) | ORAL | 0 refills | Status: DC

## 2022-11-27 NOTE — Progress Notes (Addendum)
JAHEEM, BIGWOOD ([id number]) [id number] Page 1 of 9 Visit Report for 11/26/2022 Allergy List Details Patient Name: Date of Service: [name], [name] 11/26/2022 8:45 A M Medical Record Number: [id number] Patient Account Number: [id number] Date of Birth/Sex: Treating RN: 02-Sep-1946 (76 y.o. [name]) [name] Primary Care [name]: [name] Other Clinician: Referring [name]: Treating [name]/Extender: [name] in Treatment: 0 Allergies Active Allergies No Known Drug Intolerances Allergy Notes Electronic Signature(s) Signed: 11/27/2022 4:29:22 PM By: [name] Coria RN Entered By: [name] on 11/26/2022 09:12:22 -------------------------------------------------------------------------------- [place] Details Patient Name: Date of Service: [name], [name] 11/26/2022 8:45 A M Medical Record Number: [id number] Patient Account Number: [id number] Date of Birth/Sex: Treating RN: 01/02/1946 (76 y.o. [name]) [name] Primary Care [name]: [name] Other Clinician: Referring [name]: Treating [name]/Extender: [name] in Treatment: 0 Visit Information Patient Arrived: Wheel Chair Arrival Time: 09:03 Accompanied By: partner Transfer Assistance: None Patient Identification Verified: Yes Secondary Verification Process Completed: Yes Patient Requires Transmission-Based Precautions: No Patient Has Alerts: Yes Patient Alerts: Diabetic eliquis History Since Last Visit Added or deleted any medications: No Any new allergies or adverse reactions: No Had a fall or experienced change in activities of daily living that may affect risk of falls: No Signs or symptoms of abuse/neglect since last visito No Hospitalized since last visit: No Implantable device outside of the clinic excluding cellular tissue based products placed in the center since last visit: No Has Dressing in  Place as Prescribed: Yes Pain Present Now: No Electronic Signature(s) Signed: 11/27/2022 4:29:22 PM By: [name] Coria RN Entered By: [name] on 11/26/2022 09:11:35 -------------------------------------------------------------------------------- Clinic Level of Care Assessment Details Patient Name: Date of Service: [name], [name] 11/26/2022 8:45 A M Medical Record Number: [id number] Patient Account Number: [id number] Date of Birth/Sex: Treating RN: 1946/11/06 (76 y.o. [name] Primary Care Anarie Kalish: [name] Other Clinician: Referring [name]: Treating [name]/Extender: [name] in Treatment: 0 Clinic Level of Care Assessment Items TOOL 1 Quantity Score X- 1 0 Use when EandM and Procedure is performed on INITIAL visit [name], [name] ([id number]) [id number] Page 2 of 9 ASSESSMENTS - Nursing Assessment / Reassessment X- 1 20 General Physical Exam (combine w/ comprehensive assessment (listed just below) when performed on new pt. evals) X- 1 25 Comprehensive Assessment (HX, ROS, Risk Assessments, Wounds Hx, etc.) ASSESSMENTS - Wound and Skin Assessment / Reassessment []  - 0 Dermatologic / Skin Assessment (not related to wound area) ASSESSMENTS - Ostomy and/or Continence Assessment and Care []  - 0 Incontinence Assessment and Management []  - 0 Ostomy Care Assessment and Management (repouching, etc.) PROCESS - Coordination of Care []  - 0 Simple Patient / Family Education for ongoing care []  - 0 Complex (extensive) Patient / Family Education for ongoing care X- 1 10 Staff obtains Programmer, systems, Records, T Results / Process Orders est []  - 0 Staff telephones HHA, Nursing Homes / Clarify orders / etc []  - 0 Routine Transfer to another Facility (non-emergent condition) []  - 0 Routine Hospital Admission (non-emergent condition) X- 1 15 New Admissions / Biomedical engineer / Ordering NPWT Apligraf,  etc. , []  - 0 Emergency Hospital Admission (emergent condition) PROCESS - Special Needs []  - 0 Pediatric / Minor Patient Management []  - 0 Isolation Patient Management []  - 0 Hearing / Language / Visual special needs []  - 0 Assessment of Community assistance (transportation, D/C planning, etc.) []  - 0 Additional assistance / Altered mentation []  -  0 Support Surface(s) Assessment (bed, cushion, seat, etc.) INTERVENTIONS - Miscellaneous []  - 0 External ear exam []  - 0 Patient Transfer (multiple staff / Civil Service fast streamer / Similar devices) []  - 0 Simple Staple / Suture removal (25 or less) []  - 0 Complex Staple / Suture removal (26 or more) []  - 0 Hypo/Hyperglycemic Management (do not check if billed separately) X- 1 15 Ankle / Brachial Index (ABI) - do not check if billed separately Has the patient been seen at the hospital within the last three years: Yes Total Score: 85 Level Of Care: New/Established - Level 3 Electronic Signature(s) Signed: 11/27/2022 4:29:22 PM By: [name] Coria RN Entered By: [name] on 11/26/2022 10:53:04 -------------------------------------------------------------------------------- Encounter Discharge Information Details Patient Name: Date of Service: [name], [name] 11/26/2022 8:45 A M Medical Record Number: [id number] Patient Account Number: [id number] Date of Birth/Sex: Treating RN: 23-Feb-1946 (76 y.o. [name]) [name] Primary Care [name]: [name] Other Clinician: Referring [name]: Treating [name]/Extender: [name] in Treatment: 0 Encounter Discharge Information Items Post Procedure Vitals Discharge Condition: Stable Temperature (F): 98 Ambulatory Status: Wheelchair Pulse (bpm): 71 Discharge Destination: Home Respiratory Rate (breaths/min): 18 Transportation: Private Auto Blood Pressure (mmHg): 136/62 [name], [name] ([id number]) [id number] Page 3 of  9 Accompanied By: partner Schedule Follow-up Appointment: Yes Clinical Summary of Care: Electronic Signature(s) Signed: 11/26/2022 10:54:19 AM By: [name] Coria RN Entered By: [name] on 11/26/2022 10:54:19 -------------------------------------------------------------------------------- Lower Extremity Assessment Details Patient Name: Date of Service: [name], [name] 11/26/2022 8:45 A M Medical Record Number: [id number] Patient Account Number: [id number] Date of Birth/Sex: Treating RN: [birth year] (76 y.o. [name]) [name] Primary Care Hira Trent: [name] Other Clinician: Referring Amando Chaput: Treating Samira Acero/Extender: [name] in Treatment: 0 Edema Assessment Assessed: [name]: No] [Right: No] Edema: [Left: Yes] [Right: Yes] Calf Left: Right: Point of Measurement: 34 cm From Medial Instep 38 cm 37 cm Ankle Left: Right: Point of Measurement: 10 cm From Medial Instep 29 cm 27 cm Knee To Floor Left: Right: From Medial Instep 47 cm 47 cm Vascular Assessment Pulses: Dorsalis Pedis Palpable: [Left:Yes] [Right:Yes] Blood Pressure: Brachial: [Left:136] Ankle: [Left:Dorsalis Pedis: 100 0.74] Electronic Signature(s) Signed: 11/27/2022 4:29:22 PM By: [name] Coria RN Entered By: [name] on 11/26/2022 09:50:14 -------------------------------------------------------------------------------- Multi Wound Chart Details Patient Name: Date of Service: [name], [name] 11/26/2022 8:45 A M Medical Record Number: [id number] Patient Account Number: [id number] Date of Birth/Sex: Treating RN: 02/04/1946 (76 y.o. [name]) [name] Primary Care Tiana Sivertson: [name] Other Clinician: Referring Danyla Wattley: Treating Larene Ascencio/Extender: [name] in Treatment: 0 Vital Signs Height(in): 69 Pulse(bpm): 71 Weight(lbs): 190 Blood Pressure(mmHg): 136/62 Body Mass Index(BMI): 28.1 Temperature(F): 98 Respiratory  Rate(breaths/min): 18 [[name], [name] ([id number]):Photos:] [[id number] Page 4 of 9:4 5 6] Right Calcaneus Left Calcaneus Sacrum Wound Location: Pressure Injury Pressure Injury Shear/Friction Wounding Event: Pressure Ulcer Pressure Ulcer Skin T ear Primary Etiology: Coronary Artery Disease, Coronary Artery Disease, Coronary Artery Disease, Comorbid History: Hypertension, Type I Diabetes, Hypertension, Type I Diabetes, Hypertension, Type I Diabetes, Osteoarthritis, Received Osteoarthritis, Received Osteoarthritis, Received Chemotherapy Chemotherapy Chemotherapy 10/20/2022 10/20/2022 11/11/2022 Date Acquired: 0 0 0 Weeks of Treatment: Open Open Open Wound Status: No No No Wound Recurrence: 3x2x0.1 1x0.6x0.1 5x5x0.1 Measurements L x W x D (cm) 4.712 0.471 19.635 A (cm) : rea 0.471 0.047 1.963 Volume (cm) : Unstageable/Unclassified Unstageable/Unclassified Full Thickness Without Exposed Classification: Support Structures Medium Medium Medium Exudate A mount: Serosanguineous Serosanguineous Serosanguineous Exudate Type:  red, brown red, brown red, brown Exudate Color: N/A None Present (0%) Medium (34-66%) Granulation Amount: N/A N/A Red Granulation Quality: N/A Large (67-100%) Medium (34-66%) Necrotic Amount: Eschar, Adherent [place] Necrotic Tissue: Fascia: No Fascia: No Fat Layer (Subcutaneous Tissue): Yes Exposed Structures: Fat Layer (Subcutaneous Tissue): No Fat Layer (Subcutaneous Tissue): No Fascia: No Tendon: No Tendon: No Tendon: No Muscle: No Muscle: No Muscle: No Joint: No Joint: No Joint: No Bone: No Bone: No Bone: No None None None Epithelialization: Treatment Notes Electronic Signature(s) Signed: 11/27/2022 4:29:22 PM By: [name] Coria RN Entered By: [name] on 11/26/2022  10:04:27 -------------------------------------------------------------------------------- [place] Details Patient Name: Date of Service: [name], [name] 11/26/2022 8:45 A M Medical Record Number: [id number] Patient Account Number: [id number] Date of Birth/Sex: Treating RN: 01-21-1946 (76 y.o. [name]) [name] Primary Care Alhaji Mcneal: [name] Other Clinician: Referring Arleny Kruger: Treating Montavis Schubring/Extender: [name] in Treatment: 0 Active Inactive Necrotic Tissue Nursing Diagnoses: Knowledge deficit related to management of necrotic/devitalized tissue Goals: Patient/caregiver will verbalize understanding of reason and process for debridement of necrotic tissue Date Initiated: 11/26/2022 Target Resolution Date: 12/27/2022 Goal Status: Active Interventions: Assess patient pain level pre-, during and post procedure and prior to discharge Notes: [name], [name] ([id number]) [id number] Page 5 of 9 Pressure Nursing Diagnoses: Potential for impaired tissue integrity related to pressure, friction, moisture, and shear Goals: Patient will remain free of pressure ulcers Date Initiated: 11/26/2022 Target Resolution Date: 12/27/2022 Goal Status: Active Interventions: Assess: immobility, friction, shearing, incontinence upon admission and as needed Assess offloading mechanisms upon admission and as needed Assess potential for pressure ulcer upon admission and as needed Notes: Wound/Skin Impairment Nursing Diagnoses: Knowledge deficit related to ulceration/compromised skin integrity Goals: Patient/caregiver will verbalize understanding of skin care regimen Date Initiated: 11/26/2022 Target Resolution Date: 12/27/2022 Goal Status: Active Ulcer/skin breakdown will have a volume reduction of 30% by week 4 Date Initiated: 11/26/2022 Target Resolution Date: 12/27/2022 Goal Status: Active Ulcer/skin breakdown  will have a volume reduction of 50% by week 8 Date Initiated: 11/26/2022 Target Resolution Date: 01/25/2023 Goal Status: Active Ulcer/skin breakdown will have a volume reduction of 80% by week 12 Date Initiated: 11/26/2022 Target Resolution Date: 02/25/2023 Goal Status: Active Ulcer/skin breakdown will heal within 14 weeks Date Initiated: 11/26/2022 Target Resolution Date: 03/27/2023 Goal Status: Active Interventions: Assess patient/caregiver ability to obtain necessary supplies Assess patient/caregiver ability to perform ulcer/skin care regimen upon admission and as needed Assess ulceration(s) every visit Notes: Electronic Signature(s) Signed: 11/27/2022 4:29:22 PM By: [name] Coria RN Entered By: [name] on 11/26/2022 10:03:32 -------------------------------------------------------------------------------- Pain Assessment Details Patient Name: Date of Service: [name], [name] 11/26/2022 8:45 A M Medical Record Number: [id number] Patient Account Number: [id number] Date of Birth/Sex: Treating RN: 10/14/1946 (76 y.o. [name] Primary Care Meridian Scherger: [name] Other Clinician: Referring Careena Degraffenreid: Treating Harlyn Rathmann/Extender: [name] in Treatment: 0 Active Problems Location of Pain Severity and Description of Pain Patient Has Paino No Site Locations [name], [name] ([id number]) [id number] Page 6 of 9 Pain Management and Medication Current Pain Management: Electronic Signature(s) Signed: 11/27/2022 4:29:22 PM By: [name] Coria RN Entered By: [name] on 11/26/2022 09:11:42 -------------------------------------------------------------------------------- Patient/Caregiver Education Details Patient Name: Date of Service: [name], [name] 1/16/2024andnbsp8:45 A M Medical Record Number: [id number] Patient Account Number: [id number] Date of Birth/Gender: Treating RN: [birth year] (76 y.o. [name] Primary Care Physician: [name] Other Clinician: Referring Physician: Treating  Physician/Extender: [name] in Treatment: 0 Education Assessment Education Provided To: Patient Education Topics Provided Wound/Skin Impairment: Methods: Explain/Verbal Responses: State content correctly Electronic Signature(s) Signed: 11/27/2022 4:29:22 PM By: [name] Coria RN Entered By: [name] on 11/26/2022 10:01:47 -------------------------------------------------------------------------------- Wound Assessment Details Patient Name: Date of Service: [name], [name] 11/26/2022 8:45 A M Medical Record Number: [id number] Patient Account Number: [id number] Date of Birth/Sex: Treating RN: October 14, 1946 (76 y.o. [name]) [name] Primary Care Fraida Veldman: [name] Other Clinician: Referring Annaka Cleaver: Treating Sylvestre Rathgeber/Extender: [name] in Treatment: 0 Wound Status Wound Number: 4 Primary Pressure Ulcer Etiology: Wound Location: Right Calcaneus Wound Healed - Epithelialized Wounding Event: Pressure Injury Status: Date Acquired: 10/20/2022 Comorbid Coronary Artery Disease, Hypertension, Type I Diabetes, Weeks Of Treatment: 0 History: Osteoarthritis, Received Chemotherapy Clustered Wound: No [name], [name] ([id number]) [id number] Page 7 of 9 Photos Wound Measurements Length: (cm) 1 Width: (cm) 0.6 Depth: (cm) 0.1 Area: (cm) 4.712 Volume: (cm) 0.471 % Reduction in Area: % Reduction in Volume: Epithelialization: None Tunneling: No Undermining: No Wound Description Classification: Unstageable/Unclassified Exudate Amount: Medium Exudate Type: Serosanguineous Exudate Color: red, brown Foul Odor After Cleansing: No Slough/Fibrino Yes Wound Bed Necrotic Amount: Large (67-100%) Exposed Structure Necrotic Quality: Eschar Fascia Exposed: No Fat Layer (Subcutaneous Tissue) Exposed:  No Tendon Exposed: No Muscle Exposed: No Joint Exposed: No Bone Exposed: No Treatment Notes Wound #4 (Calcaneus) Wound Laterality: Right Cleanser Soap and Water Discharge Instruction: Gently cleanse wound with antibacterial soap, rinse and pat dry prior to dressing wounds Peri-Wound Care Topical Primary Dressing Xeroform-HBD 2x2 (in/in) Discharge Instruction: Apply Xeroform-HBD 2x2 (in/in) as directed Secondary Dressing (BORDER) Zetuvit Plus SILICONE BORDER Dressing 4x4 (in/in) Discharge Instruction: Please do not put silicone bordered dressings under wraps. Use non-bordered dressing only. Secured With Compression Wrap Compression Stockings Environmental education officer) Signed: 02/04/2023 8:44:08 AM By: [name] Coria RN Previous Signature: 11/27/2022 4:29:22 PM Version By: [name] Coria RN Entered By: [name] on 01/30/2023 14:11:30 Wound Assessment Details -------------------------------------------------------------------------------- [name] ([id number]) [id number] Page 8 of 9 Patient Name: Date of Service: [name], [name] 11/26/2022 8:45 A M Medical Record Number: [id number] Patient Account Number: [id number] Date of Birth/Sex: Treating RN: 1945/12/29 (76 y.o. [name]) [name] Primary Care Laurice Iglesia: [name] Other Clinician: Referring Tanetta Fuhriman: Treating Particia Strahm/Extender: [name] in Treatment: 0 Wound Status Wound Number: 5 Primary Pressure Ulcer Etiology: Wound Location: Left Calcaneus Wound Open Wounding Event: Pressure Injury Status: Date Acquired: 10/20/2022 Comorbid Coronary Artery Disease, Hypertension, Type I Diabetes, Weeks Of Treatment: 0 History: Osteoarthritis, Received Chemotherapy Clustered Wound: No Photos Wound Measurements Length: (cm) 3 % Reduction in Area: Width: (cm) 2 % Reduction in Volume: Depth: (cm) 0.1 Epithelialization: None Area: (cm) 4.712 Tunneling:  No Volume: (cm) 0.471 Undermining: No Wound Description Classification: Unstageable/Unclassified Foul Odor After Cleansing: No Exudate Amount: Medium Slough/Fibrino Yes Exudate Type: Serosanguineous Exudate Color: red, brown Wound Bed Granulation Amount: None Present (0%) Exposed Structure Necrotic Amount: Large (67-100%) Fascia Exposed: No Necrotic Quality: Eschar Fat Layer (Subcutaneous Tissue) Exposed: No Tendon Exposed: No Muscle Exposed: No Joint Exposed: No Bone Exposed: No Electronic Signature(s) Signed: 02/04/2023 8:44:08 AM By: [name] Coria RN Previous Signature: 11/27/2022 4:29:22 PM Version By: [name] Coria RN Entered By: [name] on 01/30/2023 14:11:30 -------------------------------------------------------------------------------- Wound Assessment Details Patient Name: Date of Service: [name], [name] 11/26/2022 8:45 A M Medical Record Number: [id number] Patient Account Number: [id number] Date of Birth/Sex: Treating RN: October 05, 1946 (77 y.o.  [name] Primary Care Jahleel Stroschein: [name] Other Clinician: Referring Nassir Neidert: Treating Blen Ransome/Extender: [name] in Treatment: 0 Wound Status Wound Number: 6 Primary Skin Tear Etiology: Wound Location: Sacrum Wound Open Wounding Event: Shear/Friction Status: Date Acquired: 11/11/2022 [name], [name] ([id number]) [phone].pdf Page 9 of 9 Date Acquired: 11/11/2022 Comorbid Coronary Artery Disease, Hypertension, Type I Diabetes, Weeks Of Treatment: 0 History: Osteoarthritis, Received Chemotherapy Clustered Wound: No Photos Wound Measurements Length: (cm) 5 Width: (cm) 5 Depth: (cm) 0.1 Area: (cm) 19.635 Volume: (cm) 1.963 % Reduction in Area: % Reduction in Volume: Epithelialization: None Tunneling: No Undermining: No Wound Description Classification: Full Thickness Without Exposed Support Structures Exudate Amount: Medium Exudate  Type: Serosanguineous Exudate Color: red, brown Foul Odor After Cleansing: No Slough/Fibrino Yes Wound Bed Granulation Amount: Medium (34-66%) Exposed Structure Granulation Quality: Red Fascia Exposed: No Necrotic Amount: Medium (34-66%) Fat Layer (Subcutaneous Tissue) Exposed: Yes Necrotic Quality: Adherent Slough Tendon Exposed: No Muscle Exposed: No Joint Exposed: No Bone Exposed: No Electronic Signature(s) Signed: 11/26/2022 9:59:14 AM By: [name] Signed: 11/27/2022 4:29:22 PM By: [name] Coria RN Entered By: Worthy Keeler on 11/26/2022 09:59:14 -------------------------------------------------------------------------------- Vitals Details Patient Name: Date of Service: [name], [name] 11/26/2022 8:45 A M Medical Record Number: [id number] Patient Account Number: [id number] Date of Birth/Sex: Treating RN: 02-May-1946 (76 y.o. [name]) [name] Primary Care Mckenzee Beem: [name] Other Clinician: Referring Mariachristina Holle: Treating Baneza Bartoszek/Extender: [name] in Treatment: 0 Vital Signs Time Taken: 09:11 Temperature (F): 98 Height (in): 69 Pulse (bpm): 71 Source: Stated Respiratory Rate (breaths/min): 18 Weight (lbs): 190 Blood Pressure (mmHg): 136/62 Source: Stated Reference Range: 80 - 120 mg / dl Body Mass Index (BMI): 28.1 Electronic Signature(s) Signed: 11/27/2022 4:29:22 PM By: [name] Coria RN Entered By: [name] on 11/26/2022 09:12:09

## 2022-11-27 NOTE — Patient Instructions (Addendum)
Please call when you are ready for catheterization  Ace wraps 3 times a day for 1 hour, compression hose, leg movement for leg edema  Medication Instructions:  Please start lasix 20 mg daily as needed Take with potassium 20 meq  Please decrease midodrine down to 5 mg twice a day for 1 week If blood pressure stable, would then wean off midodrine Continue to monitor blood pressure off midodrine Check blood pressure when standing up from a sitting position  If you need a refill on your cardiac medications before your next appointment, please call your pharmacy.   Lab work: No new labs needed  Testing/Procedures: No new testing needed  Follow-Up: At [facility], you and your health needs are our priority.  As part of our continuing mission to provide you with exceptional heart care, we have created designated Provider Care Teams.  These Care Teams include your primary Cardiologist (physician) and Advanced Practice Providers (APPs -  Physician Assistants and Nurse Practitioners) who all work together to provide you with the care you need, when you need it.  You will need a follow up appointment in 2 months  Providers on your designated Care Team:   Murray Hodgkins, NP Christell Faith, [name] Cadence Kathlen Mody, [place]  COVID-19 Vaccine Information can be found at: [email or web address] For questions related to vaccine distribution or appointments, please email [email or web address] or call [phone].

## 2022-11-27 NOTE — Progress Notes (Signed)
Patient ID: [name], male   DOB: 03-09-1946, 77 y.o.   MRN: [id number] Cardiology Office Note  Date:  11/27/2022   ID:  [name], DOB 03-16-1946, MRN [id number]  PCP:  [name] Pink, MD   Chief Complaint  Patient presents with   Edema    Patient admitted ot the hospital on 10/21/22 and underwent arthroscopic washout of the left knee on the same day. Culture of the synovial fluid was  MRSA 11/22 washout MRSA- MIC vanco 1 10/07/22 washout MRSA Patient c/o swelling all over. Medications reviewed by the patient's caregiver.     HPI:  [name] is a very pleasant 77 year old gentleman with a history of coronary artery disease,  occluded LAD with stent placed February 2008 ( Cypher 3.0 x 8 mm DES stent),   previous angina was described as posterior neck pain.    diabetes type II with complications hypertension  Former smoker, quit late 61s bladder cancer,08/2017 finished chemo  Lymphedema lower extremities who presents for routine followup of his coronary artery disease, DVT November 2023  Last seen in clinic by myself jan 2023 Long discussion about recent events and hospital admissions with septic joint  Hospitalized between 10/02/22-10/14/22 and underwent washout and debridement X2 and was discharged on Iv daptomycin to SNF.   purulent drainage from the surgical sites noted by orthopedics in follow-up -readmitted to the hospital on 10/21/22 and underwent arthroscopic washout of the left knee on the same day. Culture of the synovial fluid was  MRSA 11/22 washout MRSA- MIC vanco 1 10/07/22 washout MRSA 10/21/22 washout MRSA-- the vancomycin MIC had increased to 2 from 1 before .  Added Ceftaroline  as dual MRSA coverage on 10/25/22 Daptomycin MIC was sent to [facility] and reported as 3 which was resistant. Ceftaroline MIC was susceptible at < 1 DC dapto and started vanco on 11/01/22 along with ceftaroline Unable to use rifampin instead because he was on  eliquis 4- weeks of IV antibiotic from starting ceftaroline ( 12/15) - until 11/25/22-  Echocardiogram October 05, 2022 ejection fraction estimated 35 to 40% TEE 11/23 Left ventricular ejection fraction, by estimation, is 35 to 40%.,  No endocarditis  October 03, 2022 nonocclusive thrombus seen in the left calf peroneal vein November 14, 2022 Short segment occlusive superficial thrombophlebitis involving mid humeral aspect of basilic vein right upper extremity (picc)  Presents today in a wheelchair, has appreciated leg weakness, difficulty walking, leg swelling Some abdominal fullness Currently not on Lasix Blood loss anemia, hemoglobin into the 7 range now trending back upwards, greater than 10  Entresto, carvedilol started in the hospital in the setting of depressed ejection fraction, these were held for hypotension Was discharged on midodrine 10 twice daily  Reports blood pressure 1 23-5 40 systolic, with movement sometimes up to 573 systolic  Denies having chest pain concerning for angina  EKG personally reviewed by myself on todays visit Normal sinus rhythm rate 71 bpm PVCs consider old anterior MI, left axis deviation  Past medical history completed treatments for bladder cancer,   Uses insulin pump Followed by endocrine   previous angina was described as posterior neck pain.   On previous visits, he reported having night sweats.  We held his carvedilol and Lipitor. Night sweats did not improve. told he had infection in his gums and has had significant work done.  night sweats have improved       Cardiac catheter report from 2008 details 25% ostial left main, 100% mid LAD  with stent placed at this location,40% proximal circumflex, 20% PL vessel disease Previous stress test showing scar in the mid to distal anterior wall. Previous lab work; Hemoglobin A1c 8.2, total cholesterol 134, LDL 65  PMH:   has a past medical history of Arthritis, Chronic constipation, Coronary artery  disease, Edema of both lower extremities, GERD (gastroesophageal reflux disease), History of bladder cancer (2018), History of diabetic ketoacidosis, History of DVT of lower extremity, History of kidney stones, History of MI (myocardial infarction) (12/2006), History of nonmelanoma skin cancer, Hyperlipidemia, Hypertension, Insulin dependent type 1 diabetes mellitus ([place]), Insulin pump in place, Ischemic cardiomyopathy (12/2006), Nonproliferative retinopathy due to secondary diabetes [facility]), Peripheral vascular disease ([place]), Rupture of flexor tendon of finger, S/P drug eluting coronary stent placement (12/17/2006), and Urethral stricture in male.  PSH:    Past Surgical History:  Procedure Laterality Date   CATARACT EXTRACTION W/PHACO Left 04/18/2020   Procedure: CATARACT EXTRACTION PHACO AND INTRAOCULAR LENS PLACEMENT (IOC) LEFT DIABETIC 14.60  01:57.1;  Surgeon: Birder Robson, MD;  Location: [facility];  Service: Ophthalmology;  Laterality: Left;  Diabetic - insulin pump   CATARACT EXTRACTION W/PHACO Right 10/10/2020   Procedure: CATARACT EXTRACTION PHACO AND INTRAOCULAR LENS PLACEMENT (IOC) RIGHT DIABETIC 12.59 01:44.7;  Surgeon: Birder Robson, MD;  Location: [facility];  Service: Ophthalmology;  Laterality: Right;  Diabetic - insulin pump   COLONOSCOPY WITH PROPOFOL N/A 02/18/2018   Procedure: COLONOSCOPY WITH PROPOFOL;  Surgeon: Toledo, Benay Pike, MD;  Location: [facility];  Service: Gastroenterology;  Laterality: N/A;   CORONARY ANGIOPLASTY WITH STENT PLACEMENT  12/17/2006   '@[facility]'$ ;   MI---  PCI w/ DES x2 to occluded LAD, ef 30%  (scanned in epic, under media)   CYSTOSCOPY WITH BIOPSY N/A 10/21/2017   Procedure: CYSTOSCOPY WITH BLADDER BIOPSY;  Surgeon: Royston Cowper, MD;  Location: [facility];  Service: Urology;  Laterality: N/A;   CYSTOSCOPY WITH BIOPSY N/A 12/08/2018   Procedure: CYSTOSCOPY WITH BLADDER BIOPSY-MITOMYCIN;  Surgeon: Royston Cowper, MD;  Location: [facility];   Service: Urology;  Laterality: N/A;   CYSTOSCOPY WITH DIRECT VISION INTERNAL URETHROTOMY  03/02/2020   Procedure: CYSTOSCOPY WITH DIRECT VISION INTERNAL URETHROTOMY;  Surgeon: Royston Cowper, MD;  Location: [facility];  Service: Urology;;   CYSTOSCOPY WITH DIRECT VISION INTERNAL URETHROTOMY N/A 09/14/2020   Procedure: CYSTOSCOPY WITH DIRECT VISION INTERNAL URETHROTOMY WITH HOLM LASER;  Surgeon: Royston Cowper, MD;  Location: [facility];  Service: Urology;  Laterality: N/A;   CYSTOSCOPY WITH DIRECT VISION INTERNAL URETHROTOMY N/A 11/29/2021   Procedure: CYSTOSCOPY WITH DIRECT VISION INTERNAL URETHROTOMY OPTILUME;  Surgeon: Royston Cowper, MD;  Location: [facility];  Service: Urology;  Laterality: N/A;   CYSTOSCOPY WITH HOLMIUM LASER LITHOTRIPSY N/A 05/08/2020   Procedure: CYSTOSCOPY WITH HOLMIUM LASER LITHOTRIPSY;  Surgeon: Royston Cowper, MD;  Location: [facility];  Service: Urology;  Laterality: N/A;   EXCISION MASS LOWER EXTREMETIES Right 06/05/2016   Procedure: EXCISION OF LESION RIGHT LOWER LEG;  Surgeon: Hubbard Robinson, MD;  Location: [facility];  Service: General;  Laterality: Right;   FLEXOR TENDON REPAIR Left 08/06/2022   Procedure: Left middle finger flexor tendon reconstruction stage 1;  Surgeon: Orene Desanctis, MD;  Location: [facility];  Service: Orthopedics;  Laterality: Left;   GREEN LIGHT LASER TURP (TRANSURETHRAL RESECTION OF PROSTATE N/A 08/12/2019   Procedure: GREEN LIGHT LASER TURP (TRANSURETHRAL RESECTION OF PROSTATE, BLADDER BIOPSY;  Surgeon: Royston Cowper, MD;  Location: [facility];  Service: Urology;  Laterality: N/A;   HOLMIUM LASER APPLICATION  37/34/2876   Procedure: HOLMIUM LASER APPLICATION;  Surgeon: Royston Cowper, MD;  Location: [facility];  Service: Urology;;  Urethral stone    INCISION AND DRAINAGE Left 10/07/2022   Procedure: ARTHROSCOPIC INCISION AND DRAINAGE;  Surgeon: Corky Mull, MD;  Location: [facility];  Service: Orthopedics;  Laterality:  Left;   KNEE ARTHROSCOPY Left 10/03/2022   Procedure: EXTENSIVE ARTHROSCOPIC DEBRIDMENT WITH PARTIAL MENISECTOMY;  Surgeon: Corky Mull, MD;  Location: [facility];  Service: Orthopedics;  Laterality: Left;   KNEE ARTHROSCOPY Left 10/21/2022   Procedure: ARTHROSCOPIC INCISION AND DRAINAGE KNEE-LEFT;  Surgeon: Corky Mull, MD;  Location: [facility];  Service: Orthopedics;  Laterality: Left;   SKIN CANCER EXCISION     chest   TEE WITHOUT CARDIOVERSION N/A 10/07/2022   Procedure: TRANSESOPHAGEAL ECHOCARDIOGRAM (TEE);  Surgeon: Kate Sable, MD;  Location: [facility];  Service: Cardiovascular;  Laterality: N/A;   TRANSURETHRAL RESECTION OF BLADDER TUMOR N/A 07/15/2017   Procedure: TRANSURETHRAL RESECTION OF BLADDER TUMOR (TURBT);  Surgeon: Royston Cowper, MD;  Location: [facility];  Service: Urology;  Laterality: N/A;   URETERAL BIOPSY N/A 08/12/2019   Procedure: URETERAL BIOPSY;  Surgeon: Royston Cowper, MD;  Location: [facility];  Service: Urology;  Laterality: N/A;   URETHROTOMY N/A 05/08/2020   Procedure: CYSTOSCOPY/URETHROTOMY;  Surgeon: Royston Cowper, MD;  Location: [facility];  Service: Urology;  Laterality: N/A;    Current Outpatient Medications  Medication Sig Dispense Refill   apixaban (ELIQUIS) 5 MG TABS tablet Take 1 tablet (5 mg total) by mouth 2 (two) times daily. 60 tablet 0   aspirin EC 81 MG tablet Take 81 mg by mouth daily.     atorvastatin (LIPITOR) 40 MG tablet Take 1 tablet (40 mg total) by mouth daily. Please schedule office visit for further refills. Thank you! 30 tablet 0   Cysteamine Bitartrate (PROCYSBI) 300 MG PACK Use 1 each 4 (four) times daily Use as instructed.     docusate sodium (COLACE) 100 MG capsule Take 2 capsules (200 mg total) by mouth 2 (two) times daily. (Patient taking differently: Take 100 mg by mouth daily as needed for moderate constipation.) 120 capsule 3   glucagon 1 MG injection Inject 1 mg into the skin once as needed (for low blood sugar).       Hyoscyamine Sulfate SL (LEVSIN/SL) 0.125 MG SUBL Place 0.125-0.25 mg under the tongue every 4 (four) hours as needed (bladder spasms). 1-2 TABS 40 tablet 3   insulin aspart (NOVOLOG) 100 UNIT/ML injection Inject 0-20 Units into the skin 3 (three) times daily with meals. CBG < 70:Implement Hypoglycemia Standing Orders and refer to Hypoglycemia Standing Orders sidebar report CBG 70 - 120: 0 units  CBG 121 - 150: 3 units  CBG 151 - 200: 4 units  CBG 201 - 250: 7 units  CBG 251 - 300: 11 units  CBG 301 - 350: 15 units  CBG 351 - 400: 20 units  CBG > 400 call MD and obtain STAT lab verification 10 mL 11   midodrine (PROAMATINE) 10 MG tablet Take 1 tablet (10 mg total) by mouth 2 (two) times daily with a meal. 60 tablet 0   Multiple Vitamin (MULTIVITAMIN WITH MINERALS) TABS tablet Take 1 tablet by mouth daily.     naproxen sodium (ALEVE) 220 MG tablet Take 220-440 mg by mouth 2 (two) times daily as needed (pain/headaches.).     omeprazole (PRILOSEC) 20 MG capsule  Take 20 mg by mouth 2 (two) times daily before a meal.     ondansetron (ZOFRAN-ODT) 4 MG disintegrating tablet Take 1 tablet (4 mg total) by mouth every 8 (eight) hours as needed for nausea or vomiting. 20 tablet 0   vitamin B-12 (CYANOCOBALAMIN) 1000 MCG tablet Take 1,000 mcg by mouth every other day.     Blood Glucose Monitoring Suppl (ACCU-CHEK GUIDE ME) w/Device KIT See admin instructions. (Patient not taking: Reported on 11/27/2022)     doxycycline (ADOXA) 100 MG tablet Take 1 tablet (100 mg total) by mouth 2 (two) times daily. (Patient not taking: Reported on 11/27/2022) 60 tablet 0   insulin aspart (NOVOLOG) 100 UNIT/ML injection Inject 6 Units into the skin 3 (three) times daily with meals. (Patient not taking: Reported on 11/27/2022) 10 mL 11   insulin glargine-yfgn (SEMGLEE) 100 UNIT/ML injection Inject 0.28 mLs (28 Units total) into the skin daily. (Patient not taking: Reported on 11/27/2022) 10 mL 11   Meth-Hyo-M Bl-Na Phos-Ph Sal  (URIBEL) 118 MG CAPS Take 1 capsule (118 mg total) by mouth every 6 (six) hours as needed (dysuria). (Patient not taking: Reported on 11/27/2022) 40 capsule 3   oxyCODONE (OXY IR/ROXICODONE) 5 MG immediate release tablet Take 1 tablet (5 mg total) by mouth every 6 (six) hours as needed for severe pain. (Patient not taking: Reported on 11/27/2022) 30 tablet 0   No current facility-administered medications for this visit.     Allergies:   Patient has no known allergies.   Social History:  The patient  reports that he quit smoking about 27 years ago. His smoking use included cigarettes. He has a 10.00 pack-year smoking history. He has never used smokeless tobacco. He reports that he does not drink alcohol and does not use drugs.   Family History:   family history includes Heart attack in his mother; Heart disease in his father and mother; Prostate cancer in his brother.   Review of Systems: Review of Systems  Constitutional: Negative.   Respiratory: Negative.    Cardiovascular: Negative.   Gastrointestinal: Negative.   Musculoskeletal: Negative.   Neurological: Negative.   Psychiatric/Behavioral: Negative.    All other systems reviewed and are negative.   PHYSICAL EXAM: VS:  BP 130/60 (BP Location: Left Arm, Patient Position: Sitting, Cuff Size: Normal)   Pulse 71   Ht '5\' 9"'$  (1.753 m)   Wt 187 lb (84.8 kg)   SpO2 97%   BMI 27.62 kg/m  , BMI Body mass index is 27.62 kg/m. Constitutional:  oriented to person, place, and time. No distress.  HENT:  Head: Grossly normal Eyes:  no discharge. No scleral icterus.  Neck: No JVD, no carotid bruits  Cardiovascular: Regular rate and rhythm, no murmurs appreciated Pulmonary/Chest: Clear to auscultation bilaterally, no wheezes or rails Abdominal: Soft.  no distension.  no tenderness.  Musculoskeletal: Normal range of motion Neurological:  normal muscle tone. Coordination normal. No atrophy Skin: Skin warm and dry Psychiatric: normal affect,  pleasant  Recent Labs: 10/22/2022: ALT 15 10/26/2022: B Natriuretic Peptide 542.4 11/07/2022: BUN 15; Creatinine, Ser 0.98; Hemoglobin 10.0; Magnesium 1.9; Platelets 312; Potassium 3.9; Sodium 137    Lipid Panel Lab Results  Component Value Date   CHOL 101 06/08/2018   HDL 57 06/08/2018   LDLCALC 37 06/08/2018   TRIG 34 06/08/2018      Wt Readings from Last 3 Encounters:  11/27/22 187 lb (84.8 kg)  10/21/22 190 lb (86.2 kg)  10/07/22 195 lb 1.7  oz (88.5 kg)     ASSESSMENT AND PLAN:  Atherosclerosis of native coronary artery of native heart without angina pectoris -  Known CAD,  stent x2  in the LAD, in 2008  mild circumflex and RCA calcification -Cholesterol at goal,  current non-smoker Denies any chest pain concerning for angina Depressed ejection fraction, unable to exclude underlying ischemia He prefers to wait on cardiac catheterization until he is stronger, walking Prefers to have [facility] at [facility] or [facility]  Septic joint Completing treatment for MRSA following joint replacement Weaker, working on rehab Followed by ID, inflammatory marker is being monitored  HYPERTENSION, BENIGN - Plan: EKG 12-Lead Blood pressure is stabilized with improved treatment of his bacteremia Will wean midodrine down to 5 mg twice daily for 1 week, if blood pressure stable will wean off the midodrine  Cardiomyopathy After weaning off midodrine as above, will look to reinitiate his goal-directed medical therapy for depressed ejection fraction Entresto 24/26 mg twice daily was previously held, carvedilol previously held for hypotension Given leg swelling, some abdominal fullness we will start Lasix 20 mg daily as needed with potassium 20 with his Lasix.  Suggested he consider 2- 3 times a week  Hyperlipidemia Continue Lipitor, goal LDL less than 70  Anemia Likely blood loss anemia in the setting of knee surgery Most recent hemoglobin greater than 10, slowly trending upwards  Controlled  type 2 diabetes mellitus with other circulatory complication, without long-term current use of insulin ([name])  hemoglobin A1c 7.3  He has had diabetes for 50 years Followed by endocrinology  Lymphedema Wears compressions Avoid calcium channel blockers Consider Ace wraps with transition to compression hose, leg elevation   Total encounter time more than 40 minutes  Greater than 50% was spent in counseling and coordination of care with the patient   Signed, Esmond Plants, M.D., Ph.D. 11/27/2022  [place], [place]

## 2022-11-27 NOTE — Progress Notes (Signed)
[name], CLUTTER ([id number]) [id number] Page 1 of 13 Visit Report for 11/26/2022 Chief Complaint Document Details Patient Name: Date of Service: [name] 11/26/2022 8:45 A M Medical Record Number: [id number] Patient Account Number: [id number] Date of Birth/Sex: Treating RN: [birth year] (76 y.o. [name]) Carlene Coria Primary Care Provider: [name] Other Clinician: Referring Provider: Treating Provider/Extender: Marnee Spring, [place] ST[place] Weeks in Treatment: 0 Information Obtained from: Patient Chief Complaint Bilateral heel pressure ulcers and sacral pressure ulcer Electronic Signature(s) Signed: 11/26/2022 9:56:05 AM By: [name] Entered By: Worthy Keeler on 11/26/2022 09:56:05 -------------------------------------------------------------------------------- Debridement Details Patient Name: Date of Service: [name] 11/26/2022 8:45 A M Medical Record Number: [id number] Patient Account Number: [id number] Date of Birth/Sex: Treating RN: 1946/08/20 (76 y.o. [name]) Carlene Coria Primary Care Provider: [name] Other Clinician: Referring Provider: Treating Provider/Extender: Marnee Spring, [place] Weeks in Treatment: 0 Debridement Performed for Assessment: Wound #4 Right Calcaneus Performed By: Physician [name], [name] Debridement Type: Debridement Level of Consciousness (Pre-procedure): Awake and Alert Pre-procedure Verification/Time Out Yes - 10:05 Taken: Start Time: 10:05 T Area Debrided (L x W): otal 3 (cm) x 2 (cm) = 6 (cm) Tissue and other material debrided: Viable, Non-Viable, Eschar, Subcutaneous, Skin: Dermis , Skin: Epidermis Level: Skin/Subcutaneous Tissue Debridement Description: Excisional Instrument: Curette Bleeding: Minimum End Time: 10:11 Procedural Pain: 0 Post Procedural Pain: 0 Response to Treatment: Procedure was tolerated well Level of Consciousness (Post- Awake  and Alert procedure): [name] ([id number]) [id number] Page 2 of 13 Post Debridement Measurements of Total Wound Length: (cm) 3 Stage: Unstageable/Unclassified Width: (cm) 2 Depth: (cm) 0.1 Volume: (cm) 0.471 Character of Wound/Ulcer Post Debridement: Improved Post Procedure Diagnosis Same as Pre-procedure Electronic Signature(s) Signed: 11/26/2022 5:09:00 PM By: [name] Signed: 11/27/2022 4:29:22 PM By: Carlene Coria RN Entered By: Carlene Coria on 11/26/2022 10:09:19 -------------------------------------------------------------------------------- Debridement Details Patient Name: Date of Service: [name] 11/26/2022 8:45 A M Medical Record Number: [id number] Patient Account Number: [id number] Date of Birth/Sex: Treating RN: [birth year] (76 y.o. [name]) Carlene Coria Primary Care Provider: [name] Other Clinician: Referring Provider: Treating Provider/Extender: Marnee Spring, [place] ST[place] Weeks in Treatment: 0 Debridement Performed for Assessment: Wound #5 Left Calcaneus Performed By: Physician [name], [name] Debridement Type: Debridement Level of Consciousness (Pre-procedure): Awake and Alert Pre-procedure Verification/Time Out Yes - 10:05 Taken: Start Time: 10:05 T Area Debrided (L x W): otal 1 (cm) x 0.6 (cm) = 0.6 (cm) Tissue and other material debrided: Viable, Non-Viable, Eschar, Subcutaneous, Skin: Dermis , Skin: Epidermis Level: Skin/Subcutaneous Tissue Debridement Description: Excisional Instrument: Curette Bleeding: Minimum End Time: 10:11 Procedural Pain: 0 Post Procedural Pain: 0 Response to Treatment: Procedure was tolerated well Level of Consciousness (Post- Awake and Alert procedure): Post Debridement Measurements of Total Wound Length: (cm) 1 Stage: Unstageable/Unclassified Width: (cm) 0.6 Depth: (cm) 0.1 Volume: (cm) 0.047 Character of Wound/Ulcer Post Debridement:  Improved Post Procedure Diagnosis Same as Pre-procedure Electronic Signature(s) Signed: 11/26/2022 5:09:00 PM By: [name] Signed: 11/27/2022 4:29:22 PM By: Carlene Coria RN Entered By: Carlene Coria on 11/26/2022 10:09:45 [name], [name] ([id number]) [id number] Page 3 of 13 -------------------------------------------------------------------------------- HPI Details Patient Name: Date of Service: [name] 11/26/2022 8:45 A M Medical Record Number: [id number] Patient Account Number: [id number] Date of Birth/Sex: Treating RN: Dec 15, 1945 (76 y.o. [name]) Carlene Coria Primary Care Provider: [name] Other Clinician: Referring Provider: Treating Provider/Extender: Marnee Spring, [place]  STIN Weeks in Treatment: 0 History of Present Illness Location: right anterior shin Quality: Patient reports No Pain. Severity: Patient states wound are getting worse. Duration: Patient has had the wound for > 6 months prior to seeking treatment at the wound center Context: The wound appeared gradually over time Modifying Factors: Other treatment(s) tried include: taken doxycycline and use bacitracin ssociated Signs and Symptoms: Patient reports presence of swelling A HPI Description: 77 year old male recently seen by his PCP Dr. [name] who saw him for a wound on the right leg which is less tender and he is continuing to use bacitracin and has completed her course of doxycycline. Past medical history of coronary artery disease, diabetes mellitus type 1, hyperlipidemia, hypertension, MI, plantar fascial fibromatosis, pneumonia, status post foot surgery due to trauma on the left side, coronary angioplasty. he is a former smoker and quit in July 1996. last hemoglobin A1c was 7.8 % in April of this year 5/22 2017 -- biopsy of the right lower extremity was sent for pathology and the report is that of a basal cell carcinoma with edges of the  biopsy are involved. I have called the patient over the phone( 03/28/16) and discussed the pathology report with him and he is agreeable to see a surgeon for excision and need full. I have also spoken personally to Dr. [name], of [facility] surgical and referred the patient for further wide excision of this lesion and have communicated this to the patient. READMISSION 08/12/18 This is a now 77 year old man with type 1 diabetes. Hemoglobin A1c on 4/29 was 7.9. He is listed as having a history of PAD in the [facility] records although the patient doesn't recall this, doesn't complain of any symptoms of claudication and doesn't believe he has had any prior noninvasive arterial test. He is a nonsmoker. He was here in 2017 with a wound on his Right lower leg. This was biopsied in the clinic and pathology showed a basal cell carcinoma. He went on to have surgery on this area this is currently where I believe his current wounds are located. He generally bumped the lateral part of his right calf 2 weeks ago and has a superficial abrasion. He also has 2 small open areas on the medial part of the tibia in the same area. Our intake nurse noted a stitch coming out of this which was removed. These of the wound sees actually come to the clinic for. However the more concerning area is an area on the tip of the left great toe. He says that this was initially traumatic and he's been to see Dr. [name] podiatrist. Darral Dash been using what I think is Santyl to the wound tip. This has some depth. ABIs in this clinic were non-obtainable on the right and 1.84 on the left. 08/19/18; the patient had arterial studies that did not show evidence of significant hemodynamically compromising occlusions in either leg. There was mild medial atherosclerosis bilaterally. His resting ABIs were within the normal limits at 1.3 on the right and 1.4 on the left. There was normal posterior and anterior tibial artery waveforms and velocities listed  on both sides. The patient arrives with what appears to be a healthy surface over the tip of his left great toe. This is indeed surprising. Still looks somewhat friable however. More concerning is the x-ray that we did that showed erosions of the tuft of the distal phalanx the left great toe consistent with osteomyelitis. I attempted to pull this x-ray up on colon  health Link however I can't open the film to look at this myself. 08/26/18; I reviewed the patient's x-ray and colon helpline. Indeed there is fairly obvious erosion of the tip of his left great toe. The wound was initially a traumatic area hitting it sometime in the middle of night in his kitchen. He is a type I diabetic. I have him on Flagyl and Levaquin that I started last week i.e. 7 days ago. He has an appointment with infectious disease on 09/01/18 09/02/18; the patient was seen by Dr. Delaine Lame of infectious disease. She did not feel the patient needed IV antibiotics. I do feel he needs further oral antibiotics however. He does not need anymore Flagyl but he does need another 2 weeks of Levaquin. The areas just about closed. 09/16/18; the patient is completing his Levaquin today. There is no open wound on the tip of the toe Readmission: 11-26-2022 upon evaluation today patient presents for initial inspection here in our clinic concerning issues that he has been having after having been hospitalized due to septic arthritis. Subsequently he has a PICC line that is been removed and he has been discharged from infectious disease in that regard. Unfortunately he developed shearing wounds over the gluteal region as well as a pressure ulcer over each heel although they are not looking extremely bad nonetheless they do seem to be evidence of pressure which she sustained while hospitalized. Obviously he was very sick and is still recovering as far as that is concerned. Fortunately there does not appear to be any evidence of active infection  systemically which is good news at this point according to Dr. Ardyth Gal. Patient's wounds also do not appear to be too bad at this point which is good news. Patient does have a history of type 1 diabetes mellitus, peripheral vascular disease, coronary artery disease, congestive heart failure, hypertension, and he is on long-term anticoagulant therapy. Electronic Signature(s) [name], [name] ([id number]) [id number] Page 4 of 13 Signed: 11/26/2022 10:42:52 AM By: [name] Entered By: Worthy Keeler on 11/26/2022 10:42:51 -------------------------------------------------------------------------------- Physical Exam Details Patient Name: Date of Service: [name] 11/26/2022 8:45 A M Medical Record Number: [id number] Patient Account Number: [id number] Date of Birth/Sex: Treating RN: 1945-12-19 (76 y.o. [name]) Carlene Coria Primary Care Provider: [name] Other Clinician: Referring Provider: Treating Provider/Extender: Marnee Spring, [place] Weeks in Treatment: 0 Constitutional sitting or standing blood pressure is within target range for patient.. pulse regular and within target range for patient.Marland Kitchen respirations regular, non-labored and within target range for patient.Marland Kitchen temperature within target range for patient.. Well-nourished and well-hydrated in no acute distress. Eyes conjunctiva clear no eyelid edema noted. pupils equal round and reactive to light and accommodation. Ears, Nose, Mouth, and Throat no gross abnormality of ear auricles or external auditory canals. normal hearing noted during conversation. mucus membranes moist. Respiratory normal breathing without difficulty. Cardiovascular 1+ dorsalis pedis/posterior tibialis pulses. 3+ pitting edema of the bilateral lower extremities. Musculoskeletal Patient unable to walk without assistance. Psychiatric this patient is able to make decisions and demonstrates good  insight into disease process. Alert and Oriented x 3. pleasant and cooperative. Notes Upon inspection patient's wounds again are very superficial though eschar covered. Because he is on Eliquis I did actually perform sharp debridement but did so very lightly today and he tolerated that without complication. Postdebridement the wound bed appears to be doing much better which is great news. No fevers, chills, nausea, vomiting, or diarrhea. Electronic Signature(s)  Signed: 11/26/2022 10:43:30 AM By: [name] Entered By: Worthy Keeler on 11/26/2022 10:43:30 -------------------------------------------------------------------------------- Physician Orders Details Patient Name: Date of Service: [place], [name] 11/26/2022 8:45 A M Medical Record Number: [id number] Patient Account Number: [id number] Date of Birth/Sex: Treating RN: 1946-03-06 ([address] y.o. [name]) Marquese, [place] ([id number]) [id number] Page 5 of 13 Primary Care Provider: [name] Other Clinician: Referring Provider: Treating Provider/Extender: Marnee Spring, [place] Weeks in Treatment: 0 Verbal / Phone Orders: No Diagnosis Coding ICD-10 Coding Code Description E10.621 Type 1 diabetes mellitus with foot ulcer I73.89 Other specified peripheral vascular diseases L89.610 Pressure ulcer of right heel, unstageable L89.620 Pressure ulcer of left heel, unstageable L98.412 Non-pressure chronic ulcer of buttock with fat layer exposed I25.10 Atherosclerotic heart disease of native coronary artery without angina pectoris I50.42 Chronic combined systolic (congestive) and diastolic (congestive) heart failure I10 Essential (primary) hypertension Follow-up Appointments Return Appointment in 1 week. [facility] for wound care. May utilize formulary equivalent dressing for wound treatment orders unless otherwise specified. Home Health Nurse may visit  PRN to address patients wound care needs. Alvis Lemmings fax [phone] Bathing/ Shower/ Hygiene May shower; gently cleanse wound with antibacterial soap, rinse and pat dry prior to dressing wounds Anesthetic (Use 'Patient Medications' Section for Anesthetic Order Entry) Lidocaine applied to wound bed Edema Control - Lymphedema / Segmental Compressive Device / Other Elevate, Exercise Daily and A void Standing for Long Periods of Time. Elevate legs to the level of the heart and pump ankles as often as possible Elevate leg(s) parallel to the floor when sitting. Wound Treatment Wound #4 - Calcaneus Wound Laterality: Right Cleanser: Soap and Water 1 x Per Day/30 Days Discharge Instructions: Gently cleanse wound with antibacterial soap, rinse and pat dry prior to dressing wounds Prim Dressing: Xeroform-HBD 2x2 (in/in) 1 x Per Day/30 Days ary Discharge Instructions: Apply Xeroform-HBD 2x2 (in/in) as directed Secondary Dressing: (BORDER) Zetuvit Plus SILICONE BORDER Dressing 4x4 (in/in) (Generic) 1 x Per Day/30 Days Discharge Instructions: Please do not put silicone bordered dressings under wraps. Use non-bordered dressing only. Wound #5 - Calcaneus Wound Laterality: Left Cleanser: Soap and Water 1 x Per Day/30 Days Discharge Instructions: Gently cleanse wound with antibacterial soap, rinse and pat dry prior to dressing wounds Prim Dressing: Xeroform-HBD 2x2 (in/in) 1 x Per Day/30 Days ary Discharge Instructions: Apply Xeroform-HBD 2x2 (in/in) as directed Secondary Dressing: (BORDER) Zetuvit Plus SILICONE BORDER Dressing 4x4 (in/in) (Generic) 1 x Per Day/30 Days Discharge Instructions: Please do not put silicone bordered dressings under wraps. Use non-bordered dressing only. Wound #6 - Sacrum Peri-Wound Care: AandD Ointment 1 x Per Day/30 Days Discharge Instructions: aPPLY THIN LAYER TO SACRUM DAI;Y AND PRN INCONTINENT EPISODE Consults Vascular - ABI's and TBI's - (ICD10 E10.621 - Type 1 diabetes  mellitus with foot ulcer) Electronic Signature(s) Signed: 11/26/2022 5:09:00 PM By: [name] Signed: 11/27/2022 4:29:22 PM By: Carlene Coria RN Entered By: Carlene Coria on 11/26/2022 11:51:10 [name], [name] ([id number]) [id number] Page 6 of 13 -------------------------------------------------------------------------------- Problem List Details Patient Name: Date of Service: [name] 11/26/2022 8:45 A M Medical Record Number: [id number] Patient Account Number: [id number] Date of Birth/Sex: Treating RN: 10-13-1946 (76 y.o. [name]) Carlene Coria Primary Care Provider: [name] Other Clinician: Referring Provider: Treating Provider/Extender: Marnee Spring, [place] Weeks in Treatment: 0 Active Problems ICD-10 Encounter Code Description Active Date MDM Diagnosis E10.621 Type 1 diabetes mellitus with foot ulcer 11/26/2022 No  Yes I73.89 Other specified peripheral vascular diseases 11/26/2022 No Yes L89.610 Pressure ulcer of right heel, unstageable 11/26/2022 No Yes L89.620 Pressure ulcer of left heel, unstageable 11/26/2022 No Yes L98.412 Non-pressure chronic ulcer of buttock with fat layer exposed 11/26/2022 No Yes I25.10 Atherosclerotic heart disease of native coronary artery without angina pectoris 11/26/2022 No Yes I50.42 Chronic combined systolic (congestive) and diastolic (congestive) heart failure 11/26/2022 No Yes I10 Essential (primary) hypertension 11/26/2022 No Yes Inactive Problems Resolved Problems Electronic Signature(s) Signed: 11/26/2022 10:00:02 AM By: [name] Previous Signature: 11/26/2022 9:55:32 AM Version By: [name] Entered By: Worthy Keeler on 11/26/2022 10:00:02 [name], [name] ([id number]) [id number] Page 7 of 13 -------------------------------------------------------------------------------- Progress Note Details Patient Name: Date of  Service: [name] 11/26/2022 8:45 A M Medical Record Number: [id number] Patient Account Number: [id number] Date of Birth/Sex: Treating RN: 12-18-1945 (76 y.o. [name]) Carlene Coria Primary Care Provider: [name] Other Clinician: Referring Provider: Treating Provider/Extender: Marnee Spring, [place] Weeks in Treatment: 0 Subjective Chief Complaint Information obtained from Patient Bilateral heel pressure ulcers and sacral pressure ulcer History of Present Illness (HPI) The following HPI elements were documented for the patient's wound: Location: right anterior shin Quality: Patient reports No Pain. Severity: Patient states wound are getting worse. Duration: Patient has had the wound for > 6 months prior to seeking treatment at the wound center Context: The wound appeared gradually over time Modifying Factors: Other treatment(s) tried include: taken doxycycline and use bacitracin Associated Signs and Symptoms: Patient reports presence of swelling 77 year old male recently seen by his PCP Dr. [name] who saw him for a wound on the right leg which is less tender and he is continuing to use bacitracin and has completed her course of doxycycline. Past medical history of coronary artery disease, diabetes mellitus type 1, hyperlipidemia, hypertension, MI, plantar fascial fibromatosis, pneumonia, status post foot surgery due to trauma on the left side, coronary angioplasty. he is a former smoker and quit in July 1996. last hemoglobin A1c was 7.8 % in April of this year 5/22 2017 -- biopsy of the right lower extremity was sent for pathology and the report is that of a basal cell carcinoma with edges of the biopsy are involved. I have called the patient over the phone( 03/28/16) and discussed the pathology report with him and he is agreeable to see a surgeon for excision and need full. I have also spoken personally to Dr. [name], of [facility] surgical and referred the patient  for further wide excision of this lesion and have communicated this to the patient. READMISSION 08/12/18 This is a now 77 year old man with type 1 diabetes. Hemoglobin A1c on 4/29 was 7.9. He is listed as having a history of PAD in the [facility] records although the patient doesn't recall this, doesn't complain of any symptoms of claudication and doesn't believe he has had any prior noninvasive arterial test. He is a nonsmoker. He was here in 2017 with a wound on his Right lower leg. This was biopsied in the clinic and pathology showed a basal cell carcinoma. He went on to have surgery on this area this is currently where I believe his current wounds are located. He generally bumped the lateral part of his right calf 2 weeks ago and has a superficial abrasion. He also has 2 small open areas on the medial part of the tibia in the same area. Our intake nurse noted a stitch coming out of this  which was removed. These of the wound sees actually come to the clinic for. However the more concerning area is an area on the tip of the left great toe. He says that this was initially traumatic and he's been to see Dr. [name] podiatrist. Darral Dash been using what I think is Santyl to the wound tip. This has some depth. ABIs in this clinic were non-obtainable on the right and 1.84 on the left. 08/19/18; the patient had arterial studies that did not show evidence of significant hemodynamically compromising occlusions in either leg. There was mild medial atherosclerosis bilaterally. His resting ABIs were within the normal limits at 1.3 on the right and 1.4 on the left. There was normal posterior and anterior tibial artery waveforms and velocities listed on both sides. The patient arrives with what appears to be a healthy surface over the tip of his left great toe. This is indeed surprising. Still looks somewhat friable however. More concerning is the x-ray that we did that showed erosions of the tuft of the distal  phalanx the left great toe consistent with osteomyelitis. I attempted to pull this x-ray up on colon health Link however I can't open the film to look at this myself. 08/26/18; I reviewed the patient's x-ray and colon helpline. Indeed there is fairly obvious erosion of the tip of his left great toe. The wound was initially a traumatic area hitting it sometime in the middle of night in his kitchen. He is a type I diabetic. I have him on Flagyl and Levaquin that I started last week i.e. 7 days ago. He has an appointment with infectious disease on 09/01/18 09/02/18; the patient was seen by Dr. Delaine Lame of infectious disease. She did not feel the patient needed IV antibiotics. I do feel he needs further oral antibiotics however. He does not need anymore Flagyl but he does need another 2 weeks of Levaquin. The areas just about closed. 09/16/18; the patient is completing his Levaquin today. There is no open wound on the tip of the toe Readmission: 11-26-2022 upon evaluation today patient presents for initial inspection here in our clinic concerning issues that he has been having after having been hospitalized due to septic arthritis. Subsequently he has a PICC line that is been removed and he has been discharged from infectious disease in that regard. Unfortunately he developed shearing wounds over the gluteal region as well as a pressure ulcer over each heel although they are not looking extremely bad nonetheless they do seem to be evidence of pressure which she sustained while hospitalized. Obviously he was very sick and is still recovering as far as that is concerned. Fortunately there does not appear to be any evidence of active infection systemically which is good news at this point according to Dr. Ardyth Gal. Patient's wounds also do not appear to be too bad at this point which is good news. [name], [name] ([id number]) [id number] Page 8 of 13 Patient does have a  history of type 1 diabetes mellitus, peripheral vascular disease, coronary artery disease, congestive heart failure, hypertension, and he is on long-term anticoagulant therapy. Patient History Information obtained from Patient. Allergies No Known Drug Intolerances Family History Heart Disease - Mother,Father, No family history of Cancer, Diabetes, Hereditary Spherocytosis, Hypertension, Kidney Disease, Lung Disease, Seizures, Stroke, Thyroid Problems, Tuberculosis. Social History Former smoker - 30 years ago, Marital Status - Single, Alcohol Use - Never, Drug Use - No History, Caffeine Use - Moderate. Medical History Eyes Denies history of Cataracts, Glaucoma, Optic  Neuritis Ear/Nose/Mouth/Throat Denies history of Chronic sinus problems/congestion, Middle ear problems Hematologic/Lymphatic Denies history of Anemia, Hemophilia, Human Immunodeficiency Virus, Lymphedema, Sickle Cell Disease Respiratory Denies history of Aspiration, Asthma, Chronic Obstructive Pulmonary Disease (COPD), Pneumothorax, Sleep Apnea, Tuberculosis Cardiovascular Patient has history of Coronary Artery Disease, Hypertension - medication controlled Denies history of Angina, Arrhythmia, Congestive Heart Failure, Deep Vein Thrombosis, Hypotension, Myocardial Infarction, Peripheral Arterial Disease, Peripheral Venous Disease, Phlebitis, Vasculitis Gastrointestinal Denies history of Cirrhosis , Colitis, Crohnoos, Hepatitis A, Hepatitis B, Hepatitis C Endocrine Patient has history of Type I Diabetes Denies history of Type II Diabetes Genitourinary Denies history of End Stage Renal Disease Immunological Denies history of Lupus Erythematosus, Raynaudoos, Scleroderma Integumentary (Skin) Denies history of History of Burn, History of pressure wounds Musculoskeletal Patient has history of Osteoarthritis - knee Denies history of Gout, Rheumatoid Arthritis, Osteomyelitis Neurologic Denies history of Dementia,  Neuropathy, Quadriplegia, Paraplegia, Seizure Disorder Oncologic Patient has history of Received Chemotherapy Denies history of Received Radiation Psychiatric Denies history of Anorexia/bulimia, Confinement Anxiety Medical A Surgical History Notes nd Constitutional Symptoms (General Health) Stent in heart (2009); Wound on left foot (treated by Cleda Mccreedy) stepped on rock; A1C 7.8; Cardiovascular HLD Oncologic bladder cancer Review of Systems (ROS) Integumentary (Skin) Complains or has symptoms of Wounds, Swelling. Objective Constitutional sitting or standing blood pressure is within target range for patient.. pulse regular and within target range for patient.Marland Kitchen respirations regular, non-labored and within target range for patient.Marland Kitchen temperature within target range for patient.. Well-nourished and well-hydrated in no acute distress. Vitals Time Taken: 9:11 AM, Height: 69 in, Source: Stated, Weight: 190 lbs, Source: Stated, BMI: 28.1, Temperature: 98 F, Pulse: 71 bpm, Respiratory Rate: 18 breaths/min, Blood Pressure: 136/62 mmHg. Eyes conjunctiva clear no eyelid edema noted. pupils equal round and reactive to light and accommodation. Ears, Nose, Mouth, and Throat no gross abnormality of ear auricles or external auditory canals. normal hearing noted during conversation. mucus membranes moist. Respiratory [name], [name] ([id number]) [id number] Page 9 of 13 normal breathing without difficulty. Cardiovascular 1+ dorsalis pedis/posterior tibialis pulses. 3+ pitting edema of the bilateral lower extremities. Musculoskeletal Patient unable to walk without assistance. Psychiatric this patient is able to make decisions and demonstrates good insight into disease process. Alert and Oriented x 3. pleasant and cooperative. General Notes: Upon inspection patient's wounds again are very superficial though eschar covered. Because he is on Eliquis I did actually perform  sharp debridement but did so very lightly today and he tolerated that without complication. Postdebridement the wound bed appears to be doing much better which is great news. No fevers, chills, nausea, vomiting, or diarrhea. Integumentary (Hair, Skin) Wound #4 status is Open. Original cause of wound was Pressure Injury. The date acquired was: 10/20/2022. The wound is located on the Right Calcaneus. The wound measures 3cm length x 2cm width x 0.1cm depth; 4.712cm^2 area and 0.471cm^3 volume. There is no tunneling or undermining noted. There is a medium amount of serosanguineous drainage noted. There is a large (67-100%) amount of necrotic tissue within the wound bed including Eschar and Adherent Slough. Wound #5 status is Open. Original cause of wound was Pressure Injury. The date acquired was: 10/20/2022. The wound is located on the Left Calcaneus. The wound measures 1cm length x 0.6cm width x 0.1cm depth; 0.471cm^2 area and 0.047cm^3 volume. There is no tunneling or undermining noted. There is a medium amount of serosanguineous drainage noted. There is no granulation within the wound bed. There is a large (67-100%) amount of necrotic tissue within  the wound bed including Eschar and Adherent Slough. Wound #6 status is Open. Original cause of wound was Shear/Friction. The date acquired was: 11/11/2022. The wound is located on the Sacrum. The wound measures 5cm length x 5cm width x 0.1cm depth; 19.635cm^2 area and 1.963cm^3 volume. There is Fat Layer (Subcutaneous Tissue) exposed. There is no tunneling or undermining noted. There is a medium amount of serosanguineous drainage noted. There is medium (34-66%) red granulation within the wound bed. There is a medium (34-66%) amount of necrotic tissue within the wound bed including Adherent Slough. Assessment Active Problems ICD-10 Type 1 diabetes mellitus with foot ulcer Other specified peripheral vascular diseases Pressure ulcer of right heel,  unstageable Pressure ulcer of left heel, unstageable Non-pressure chronic ulcer of buttock with fat layer exposed Atherosclerotic heart disease of native coronary artery without angina pectoris Chronic combined systolic (congestive) and diastolic (congestive) heart failure Essential (primary) hypertension Procedures Wound #4 Pre-procedure diagnosis of Wound #4 is a Pressure Ulcer located on the Right Calcaneus . There was a Excisional Skin/Subcutaneous Tissue Debridement with a total area of 6 sq cm performed by [name], [name]. With the following instrument(s): Curette to remove Viable and Non-Viable tissue/material. Material removed includes Eschar, Subcutaneous Tissue, Skin: Dermis, and Skin: Epidermis. No specimens were taken. A time out was conducted at 10:05, prior to the start of the procedure. A Minimum amount of bleeding was controlled with N/A. The procedure was tolerated well with a pain level of 0 throughout and a pain level of 0 following the procedure. Post Debridement Measurements: 3cm length x 2cm width x 0.1cm depth; 0.471cm^3 volume. Post debridement Stage noted as Unstageable/Unclassified. Character of Wound/Ulcer Post Debridement is improved. Post procedure Diagnosis Wound #4: Same as Pre-Procedure Wound #5 Pre-procedure diagnosis of Wound #5 is a Pressure Ulcer located on the Left Calcaneus . There was a Excisional Skin/Subcutaneous Tissue Debridement with a total area of 0.6 sq cm performed by [name], [name]. With the following instrument(s): Curette to remove Viable and Non-Viable tissue/material. Material removed includes Eschar, Subcutaneous Tissue, Skin: Dermis, and Skin: Epidermis. No specimens were taken. A time out was conducted at 10:05, prior to the start of the procedure. A Minimum amount of bleeding was controlled with N/A. The procedure was tolerated well with a pain level of 0 throughout and a pain level of 0 following the procedure. Post Debridement  Measurements: 1cm length x 0.6cm width x 0.1cm depth; 0.047cm^3 volume. Post debridement Stage noted as Unstageable/Unclassified. Character of Wound/Ulcer Post Debridement is improved. Post procedure Diagnosis Wound #5: Same as Pre-Procedure Plan Follow-up Appointments: Return Appointment in 1 week. Home Health: [facility] for wound care. May utilize formulary equivalent dressing for wound treatment orders unless otherwise specified. Home Health Nurse [name], [name] ([id number]) [id number] Page 10 of 13 may visit PRN to address patientoos wound care needs. Alvis Lemmings Anesthetic (Use 'Patient Medications' Section for Anesthetic Order Entry): Lidocaine applied to wound bed Edema Control - Lymphedema / Segmental Compressive Device / Other: Elevate, Exercise Daily and Avoid Standing for Long Periods of Time. Elevate legs to the level of the heart and pump ankles as often as possible Elevate leg(s) parallel to the floor when sitting. WOUND #4: - Calcaneus Wound Laterality: Right Cleanser: Soap and Water 1 x Per Day/30 Days Discharge Instructions: Gently cleanse wound with antibacterial soap, rinse and pat dry prior to dressing wounds Prim Dressing: Xeroform-HBD 2x2 (in/in) 1 x Per Day/30 Days ary Discharge Instructions: Apply Xeroform-HBD 2x2 (in/in)  as directed Secondary Dressing: (BORDER) Zetuvit Plus SILICONE BORDER Dressing 4x4 (in/in) (Generic) 1 x Per Day/30 Days Discharge Instructions: Please do not put silicone bordered dressings under wraps. Use non-bordered dressing only. WOUND #5: - Calcaneus Wound Laterality: Left Cleanser: Soap and Water 1 x Per Day/30 Days Discharge Instructions: Gently cleanse wound with antibacterial soap, rinse and pat dry prior to dressing wounds Prim Dressing: Xeroform-HBD 2x2 (in/in) 1 x Per Day/30 Days ary Discharge Instructions: Apply Xeroform-HBD 2x2 (in/in) as directed Secondary Dressing: (BORDER) Zetuvit  Plus SILICONE BORDER Dressing 4x4 (in/in) (Generic) 1 x Per Day/30 Days Discharge Instructions: Please do not put silicone bordered dressings under wraps. Use non-bordered dressing only. WOUND #6: - Sacrum Wound Laterality: Peri-Wound Care: AandD Ointment 1 x Per Day/30 Days Discharge Instructions: aPPLY THIN LAYER TO SACRUM DAI;Y AND PRN INCONTINENT EPISODE 1. I am going to suggest that we have the patient continue to monitor for any signs of worsening infection obviously he is coming off the PICC line which is good news. 2. I am going to recommend as well that he continues to keep pressure off of his heels. This will obviously help him to heal faster. 3. Begin use Xeroform gauze to the heels. 4. I am also going to recommend that we have the patient use AandD ointment to the gluteal area which I think should do well no actual dressing I think is good to be necessary in this region. We will see patient back for reevaluation in 1 week here in the clinic. If anything worsens or changes patient will contact our office for additional recommendations. Electronic Signature(s) Signed: 11/26/2022 10:43:58 AM By: [name] Entered By: Worthy Keeler on 11/26/2022 10:43:58 -------------------------------------------------------------------------------- ROS/PFSH Details Patient Name: Date of Service: [name] 11/26/2022 8:45 A M Medical Record Number: [id number] Patient Account Number: [id number] Date of Birth/Sex: Treating RN: Jan 11, 1946 (76 y.o. [name]) Carlene Coria Primary Care Provider: [name] Other Clinician: Referring Provider: Treating Provider/Extender: Marnee [place], [place] Weeks in Treatment: 0 Information Obtained From Patient Integumentary (Skin) Complaints and Symptoms: Positive for: Wounds; Swelling Medical History: Negative for: History of Burn; History of pressure wounds Constitutional Symptoms (General Health) Medical History: Past Medical  History Notes: Stent in heart (2009); Wound on left foot (treated by Cleda Mccreedy) stepped on rock; A1C 7.8; Riva, Paden [name] ([id number]) [id number] Page 11 of 13 Eyes Medical History: Negative for: Cataracts; Glaucoma; Optic Neuritis Ear/Nose/Mouth/Throat Medical History: Negative for: Chronic sinus problems/congestion; Middle ear problems Hematologic/Lymphatic Medical History: Negative for: Anemia; Hemophilia; Human Immunodeficiency Virus; Lymphedema; Sickle Cell Disease Respiratory Medical History: Negative for: Aspiration; Asthma; Chronic Obstructive Pulmonary Disease (COPD); Pneumothorax; Sleep Apnea; Tuberculosis Cardiovascular Medical History: Positive for: Coronary Artery Disease; Hypertension - medication controlled Negative for: Angina; Arrhythmia; Congestive Heart Failure; Deep Vein Thrombosis; Hypotension; Myocardial Infarction; Peripheral Arterial Disease; Peripheral Venous Disease; Phlebitis; Vasculitis Past Medical History Notes: HLD Gastrointestinal Medical History: Negative for: Cirrhosis ; Colitis; Crohns; Hepatitis A; Hepatitis B; Hepatitis C Endocrine Medical History: Positive for: Type I Diabetes Negative for: Type II Diabetes Time with diabetes: 57 years Treated with: Insulin Blood sugar tested every day: Yes Tested : 5 times daily Blood sugar testing results: Breakfast: 120 Genitourinary Medical History: Negative for: End Stage Renal Disease Immunological Medical History: Negative for: Lupus Erythematosus; Raynauds; Scleroderma Musculoskeletal Medical History: Positive for: Osteoarthritis - knee Negative for: Gout; Rheumatoid Arthritis; Osteomyelitis Neurologic Medical History: Negative for: Dementia; Neuropathy; Quadriplegia; Paraplegia; Seizure Disorder Oncologic Medical History: Positive for: Received Chemotherapy  Negative for: Received Radiation Past Medical History Notes: bladder cancer Psychiatric Medical  History: Negative for: [name] Anxiety [name], [name] ([id number]) [id number] Page 12 of 13 Immunizations Pneumococcal Vaccine: Received Pneumococcal Vaccination: Yes Received Pneumococcal Vaccination On or After 60th Birthday: No Implantable Devices None Family and Social History Cancer: No; Diabetes: No; Heart Disease: Yes - Mother,Father; Hereditary Spherocytosis: No; Hypertension: No; Kidney Disease: No; Lung Disease: No; Seizures: No; Stroke: No; Thyroid Problems: No; Tuberculosis: No; Former smoker - 30 years ago; Marital Status - Single; Alcohol Use: Never; Drug Use: No History; Caffeine Use: Moderate Electronic Signature(s) Signed: 11/26/2022 5:09:00 PM By: [name] Signed: 11/27/2022 4:29:22 PM By: Carlene Coria RN Entered By: Carlene Coria on 11/26/2022 09:15:33 -------------------------------------------------------------------------------- SuperBill Details Patient Name: Date of Service: [place], [name] 11/26/2022 Medical Record Number: [id number] Patient Account Number: [id number] Date of Birth/Sex: Treating RN: 1946/06/29 (76 y.o. [name]) Carlene Coria Primary Care Provider: [name] Other Clinician: Referring Provider: Treating Provider/Extender: [place], [place] Weeks in Treatment: 0 Diagnosis Coding ICD-10 Codes Code Description E10.621 Type 1 diabetes mellitus with foot ulcer I73.89 Other specified peripheral vascular diseases L89.610 Pressure ulcer of right heel, unstageable L89.620 Pressure ulcer of left heel, unstageable L98.412 Non-pressure chronic ulcer of buttock with fat layer exposed I25.10 Atherosclerotic heart disease of native coronary artery without angina pectoris I50.42 Chronic combined systolic (congestive) and diastolic (congestive) heart failure I10 Essential (primary) hypertension Facility Procedures : CPT4 Code: 54360677 Description: 99213 - WOUND CARE  VISIT-LEV 3 EST PT Modifier: Quantity: 1 : CPT4 Code: 03403524 Description: 81859 - DEB SUBQ TISSUE 20 SQ CM/< ICD-10 Diagnosis Description L89.610 Pressure ulcer of right heel, unstageable L89.620 Pressure ulcer of left heel, unstageable Modifier: Quantity: 1 Physician Procedures : CPT4 Code Description Modifier 0931121 WC PHYS LEVEL 3 NEW PT 25 ICD-10 Diagnosis Description E10.621 Type 1 diabetes mellitus with foot ulcer I73.89 Other specified peripheral vascular diseases [name], [name] ([id number])  [id number] L89.610 Pressure ulcer of right heel, unstageable L89.620 Pressure ulcer of left heel, unstageable Quantity: 1 Page 13 of 13 : [id number] 11042 - WC PHYS SUBQ TISS 20 SQ CM 1 ICD-10 Diagnosis Description L89.610 Pressure ulcer of right heel, unstageable L89.620 Pressure ulcer of left heel, unstageable Quantity: Electronic Signature(s) Signed: 11/26/2022 10:53:14 AM By: Carlene Coria RN Signed: 11/26/2022 5:09:00 PM By: [name] Previous Signature: 11/26/2022 10:44:21 AM Version By: [name] Entered By: Carlene Coria on 11/26/2022 10:53:13

## 2022-11-27 NOTE — Progress Notes (Signed)
JONOTHAN, HEBERLE ([id number]) [phone] [id number] Page 1 of 5 Visit Report for 11/26/2022 Abuse Risk Screen Details Patient Name: Date of Service: [name], [name] 11/26/2022 8:45 A M Medical Record Number: [id number] Patient Account Number: [id number] Date of Birth/Sex: Treating RN: 04/12/1946 (77 y.o. Jerilynn Mages) Carlene Coria Primary Care Shelma Eiben: [name] Other Clinician: Referring Rodolphe Edmonston: Treating Governor Matos/Extender: [place], [place] Weeks in Treatment: 0 Abuse Risk Screen Items Answer ABUSE RISK SCREEN: Has anyone close to you tried to hurt or harm you recentlyo No Do you feel uncomfortable with anyone in your familyo No Has anyone forced you do things that you didnt want to doo No Electronic Signature(s) Signed: 11/27/2022 4:29:22 PM By: Carlene Coria RN Entered By: Carlene Coria on 11/26/2022 09:15:42 -------------------------------------------------------------------------------- Activities of Daily Living Details Patient Name: Date of Service: [place], [place]. 11/26/2022 8:45 A M Medical Record Number: [id number] Patient Account Number: [id number] Date of Birth/Sex: Treating RN: 07-Jun-1946 (77 y.o. Jerilynn Mages) Carlene Coria Primary Care Cesilia Shinn: [name] Other Clinician: Referring Guinevere Stephenson: Treating Conlee Sliter/Extender: [place]nee [place], [place] Weeks in Treatment: 0 Activities of Daily Living Items Answer Activities of Daily Living (Please select one for each item) Drive Automobile Not Able T Medications ake Completely Able Use T elephone Completely Able Care for Appearance Need Assistance Use T oilet Need Assistance Bath / Shower Need Assistance Dress Self Need Assistance Feed Self Completely Able Walk Completely Able Get In / Out Bed Need Assistance Housework Not [place], [place] ([id number]) [phone] [id number] Page 2 of 5 Prepare Meals Not Able Handle Money Need  Assistance Shop for Self Need Assistance Electronic Signature(s) Signed: 11/27/2022 4:29:22 PM By: Carlene Coria RN Entered By: Carlene Coria on 11/26/2022 09:18:16 -------------------------------------------------------------------------------- Education Screening Details Patient Name: Date of Service: [name], [name] 11/26/2022 8:45 A M Medical Record Number: [id number] Patient Account Number: [id number] Date of Birth/Sex: Treating RN: May 25, 1946 (77 y.o. Jerilynn Mages) Carlene Coria Primary Care Adore Kithcart: [name] Other Clinician: Referring Ashton Belote: Treating Linley Moskal/Extender: [place], [place] Weeks in Treatment: 0 Primary Learner Assessed: Patient Learning Preferences/Education Level/Primary Language Learning Preference: Explanation Highest Education Level: College or Above Preferred Language: English Cognitive Barrier Language Barrier: No Translator Needed: No Memory Deficit: No Emotional Barrier: No Cultural/Religious Beliefs Affecting Medical Care: No Physical Barrier Impaired Vision: No Impaired Hearing: No Decreased Hand dexterity: No Knowledge/Comprehension Knowledge Level: Medium Comprehension Level: High Ability to understand written instructions: High Ability to understand verbal instructions: High Motivation Anxiety Level: Anxious Cooperation: Cooperative Education Importance: Acknowledges Need Interest in Health Problems: Asks Questions Perception: Coherent Willingness to Engage in Self-Management High Activities: Readiness to Engage in Self-Management High Activities: Electronic Signature(s) Signed: 11/27/2022 4:29:22 PM By: Carlene Coria RN Entered By: Carlene Coria on 11/26/2022 09:19:21 Touchet, [name] ([id number]) [id number] Page 3 of 5 -------------------------------------------------------------------------------- Fall Risk Assessment Details Patient Name: Date of Service: [name], CLA UDE  H. 11/26/2022 8:45 A M Medical Record Number: [id number] Patient Account Number: [id number] Date of Birth/Sex: Treating RN: May 08, 1946 (77 y.o. Jerilynn Mages) Carlene Coria Primary Care Robert Sunga: [name] Other Clinician: Referring Treasure Ingrum: Treating Rumi Taras/Extender: [place], [place] Weeks in Treatment: 0 Fall Risk Assessment Items Have you had 2 or more falls in the last 12 monthso 0 No Have you had any fall that resulted in injury in the last 12 monthso 0 No FALLS RISK SCREEN History of falling - immediate or within 3 months 0 No Secondary diagnosis (Do you have 2 or more  medical diagnoseso) 0 No Ambulatory aid None/bed rest/wheelchair/nurse 0 Yes Crutches/cane/walker 0 No Furniture 0 No Intravenous therapy Access/Saline/Heparin Lock 0 No Gait/Transferring Normal/ bed rest/ wheelchair 0 Yes Weak (short steps with or without shuffle, stooped but able to lift head while walking, may seek 0 No support from furniture) Impaired (short steps with shuffle, may have difficulty arising from chair, head down, impaired 0 No balance) Mental Status Oriented to own ability 0 Yes Electronic Signature(s) Signed: 11/27/2022 4:29:22 PM By: Carlene Coria RN Entered By: Carlene Coria on 11/26/2022 09:19:55 -------------------------------------------------------------------------------- Foot Assessment Details Patient Name: Date of Service: [name], [name] 11/26/2022 8:45 A M Medical Record Number: [id number] Patient Account Number: [id number] Date of Birth/Sex: Treating RN: 04-Aug-1946 (77 y.o. Jerilynn Mages) Carlene Coria Primary Care Aloys Hupfer: [name] Other Clinician: Referring Suhana Wilner: Treating Nikodem Leadbetter/Extender: [place], [place] Weeks in Treatment: 0 Foot Assessment Items Site Locations DUWAN, ADRIAN H ([id number]) [phone] [id number] Page 4 of 5 + = Sensation present, - = Sensation absent, C = Callus, U = Ulcer R = Redness, W = Warmth, M =  Maceration, PU = Pre-ulcerative lesion F = Fissure, S = Swelling, D = Dryness Assessment Right: Left: Other Deformity: No No Prior Foot Ulcer: No No Prior Amputation: No No Charcot Joint: No No Ambulatory Status: Ambulatory With Help Assistance Device: Walker Gait: Administrator, arts) Signed: 11/27/2022 4:29:22 PM By: Carlene Coria RN Entered By: Carlene Coria on 11/26/2022 09:23:52 -------------------------------------------------------------------------------- Nutrition Risk Screening Details Patient Name: Date of Service: [name], [name] 11/26/2022 8:45 A M Medical Record Number: [id number] Patient Account Number: [id number] Date of Birth/Sex: Treating RN: [birth year] (77 y.o. Jerilynn Mages) Carlene Coria Primary Care Mertis Mosher: [name] Other Clinician: Referring Lema Heinkel: Treating Michaeal Davis/Extender: [place], [place] Weeks in Treatment: 0 Height (in): 69 Weight (lbs): 190 Body Mass Index (BMI): 28.1 Nutrition Risk Screening Items Score Screening NUTRITION RISK SCREEN: I have an illness or condition that made me change the kind and/or amount of food I eat 2 Yes I eat fewer than two meals per day 0 No I eat few fruits and vegetables, or milk products 0 No I have three or more drinks of beer, liquor or wine almost every day 0 No I have tooth or mouth problems that make it hard for me to eat 0 No Cobos, Arless H ([id number]) [id number] Page 5 of 5 I don't always have enough money to buy the food I need 0 No I eat alone most of the time 0 No I take three or more different prescribed or over-the-counter drugs a day 1 Yes Without wanting to, I have lost or gained 10 pounds in the last six months 0 No I am not always physically able to shop, cook and/or feed myself 2 Yes Nutrition Protocols Good Risk Protocol Moderate Risk Protocol 0 Provide education on nutrition High Risk Proctocol Risk Level: Moderate  Risk Score: 5 Electronic Signature(s) Signed: 11/27/2022 4:29:22 PM By: Carlene Coria RN Entered By: Carlene Coria on 11/26/2022 09:20:16

## 2022-12-03 ENCOUNTER — Encounter: Payer: Medicare HMO | Admitting: Physician Assistant

## 2022-12-03 ENCOUNTER — Telehealth: Payer: Self-pay | Admitting: Cardiovascular Disease

## 2022-12-03 DIAGNOSIS — L98412 Non-pressure chronic ulcer of buttock with fat layer exposed: Secondary | ICD-10-CM | POA: Diagnosis not present

## 2022-12-03 DIAGNOSIS — L89620 Pressure ulcer of left heel, unstageable: Secondary | ICD-10-CM | POA: Diagnosis not present

## 2022-12-03 DIAGNOSIS — I110 Hypertensive heart disease with heart failure: Secondary | ICD-10-CM | POA: Diagnosis not present

## 2022-12-03 DIAGNOSIS — I5042 Chronic combined systolic (congestive) and diastolic (congestive) heart failure: Secondary | ICD-10-CM | POA: Diagnosis not present

## 2022-12-03 DIAGNOSIS — E1051 Type 1 diabetes mellitus with diabetic peripheral angiopathy without gangrene: Secondary | ICD-10-CM | POA: Diagnosis not present

## 2022-12-03 DIAGNOSIS — L89610 Pressure ulcer of right heel, unstageable: Secondary | ICD-10-CM | POA: Diagnosis not present

## 2022-12-03 DIAGNOSIS — E10622 Type 1 diabetes mellitus with other skin ulcer: Secondary | ICD-10-CM | POA: Diagnosis not present

## 2022-12-03 DIAGNOSIS — I2510 Atherosclerotic heart disease of native coronary artery without angina pectoris: Secondary | ICD-10-CM | POA: Diagnosis not present

## 2022-12-03 NOTE — Telephone Encounter (Signed)
Pt c/o medication issue:  1. Name of Medication:   furosemide (LASIX) 20 MG tablet    2. How are you currently taking this medication (dosage and times per day)?   3. Are you having a reaction (difficulty breathing--STAT)?   4. What is your medication issue? Pt states he is still swelling in hands and legs. Denies any SOB. He states he went to see his wound care doctor and they suggested he take furosemide once a day instead of as needed and he would like to know what Dr. Rockey Situ thinks about it.

## 2022-12-03 NOTE — Telephone Encounter (Signed)
The patient was recently seen in the office with Dr. Rockey Situ on 11/27/22. He carries a diagnosis of lymphadema. He was prescribed lasix 20 mg once daily as needed at that visit.  To Dr. Rockey Situ to advise if he would like the patient to take this daily.

## 2022-12-03 NOTE — Progress Notes (Signed)
[name] ([id number]) [id number] Page 1 of 5 Visit Report for 12/03/2022 Chief Complaint Document Details Patient Name: Date of Service: [name] 12/03/2022 11:15 A M Medical Record Number: [id number] Patient Account Number: [id number] Date of Birth/Sex: Treating RN: [birth year] (77 y.o. [name] Primary Care Provider: [place] [name] Other Clinician: Referring Provider: Treating Provider/Extender: [name] in Treatment: 1 Information Obtained from: Patient Chief Complaint Bilateral heel pressure ulcers and sacral pressure ulcer Electronic Signature(s) Signed: 12/03/2022 11:19:57 AM By: [name] Entered By: Worthy [name] on 12/03/2022 11:19:57 -------------------------------------------------------------------------------- Debridement Details Patient Name: Date of Service: [name] 12/03/2022 11:15 A M Medical Record Number: [id number] Patient Account Number: [id number] Date of Birth/Sex: Treating RN: [name] (77 y.o. [name] Primary Care Provider: [place] [name] Other Clinician: Referring Provider: Treating Provider/Extender: [name] in Treatment: 1 Debridement Performed for Assessment: Wound #5 Left Calcaneus Performed By: Physician [name] Sams., [name] Debridement Type: Debridement Level of Consciousness (Pre-procedure): Awake and Alert Pre-procedure Verification/Time Out Yes - 11:40 Taken: Pain Control: Lidocaine T Area Debrided (L x W): otal 1.6 (cm) x 3 (cm) = 4.8 (cm) Tissue and other material debrided: Viable, Non-Viable, Slough, Subcutaneous, Slough Level: Skin/Subcutaneous Tissue Debridement Description: Excisional Instrument: Curette Bleeding: Minimum Hemostasis Achieved: Pressure Response to Treatment: Procedure was tolerated well Level of Consciousness (Post- Awake and Alert procedure): Post Debridement Measurements  of Total Wound [name], [name] ([id number]) [id number] Page 2 of 5 Length: (cm) 1.6 Stage: Unstageable/Unclassified Width: (cm) 3 Depth: (cm) 0.3 Volume: (cm) 1.131 Character of Wound/Ulcer Post Debridement: Stable Post Procedure Diagnosis Same as Pre-procedure Electronic Signature(s) Unsigned Entered By: [name] on 12/03/2022 11:42:08 -------------------------------------------------------------------------------- Debridement Details Patient Name: Date of Service: [name] 12/03/2022 11:15 A M Medical Record Number: [id number] Patient Account Number: [id number] Date of Birth/Sex: Treating RN: [name] (77 y.o. [name] Primary Care Provider: [place] [name] Other Clinician: Referring Provider: Treating Provider/Extender: [name] in Treatment: 1 Debridement Performed for Assessment: Wound #4 Right Calcaneus Performed By: Physician [name] Sams., [name] Debridement Type: Debridement Level of Consciousness (Pre-procedure): Awake and Alert Pre-procedure Verification/Time Out Yes - 11:40 Taken: Pain Control: Lidocaine T Area Debrided (L x W): otal 0.9 (cm) x 0.7 (cm) = 0.63 (cm) Tissue and other material debrided: Viable, Non-Viable, Slough, Subcutaneous, Slough Level: Skin/Subcutaneous Tissue Debridement Description: Excisional Instrument: Curette Bleeding: Minimum Hemostasis Achieved: Pressure Response to Treatment: Procedure was tolerated well Level of Consciousness (Post- Awake and Alert procedure): Post Debridement Measurements of Total Wound Length: (cm) 0.9 Stage: Unstageable/Unclassified Width: (cm) 0.7 Depth: (cm) 0.1 Volume: (cm) 0.049 Character of Wound/Ulcer Post Debridement: Stable Post Procedure Diagnosis Same as Pre-procedure Electronic Signature(s) Unsigned Entered By: [name] on 12/03/2022 11:42:46 Signature(s): [name]  ([id number]) [id number] Date(s): [id number] Page 3 of 5 -------------------------------------------------------------------------------- Physician Orders Details Patient Name: Date of Service: [name] 12/03/2022 11:15 A M Medical Record Number: [id number] Patient Account Number: [id number] Date of Birth/Sex: Treating RN: [name] (77 y.o. [name] Primary Care Provider: [place] [name] Other Clinician: Referring Provider: Treating Provider/Extender: [name] in Treatment: 1 Verbal / Phone Orders: No Diagnosis Coding ICD-10 Coding Code Description E10.621 Type 1 diabetes mellitus with foot ulcer I73.89 Other specified peripheral vascular diseases L89.610 Pressure ulcer of right heel, unstageable L89.620 Pressure ulcer of left heel, unstageable L98.412 Non-pressure chronic ulcer  of buttock with fat layer exposed I25.10 Atherosclerotic heart disease of native coronary artery without angina pectoris I50.42 Chronic combined systolic (congestive) and diastolic (congestive) heart failure I10 Essential (primary) hypertension Follow-up Appointments Return Appointment in 1 week. [place] for wound care. May utilize formulary equivalent dressing for wound treatment orders unless otherwise specified. Home Health Nurse may visit [facility] to address patients wound care needs. [name] fax [phone] Bathing/ Shower/ Hygiene May shower; gently cleanse wound with antibacterial soap, rinse and pat dry prior to dressing wounds Anesthetic (Use 'Patient Medications' Section for Anesthetic Order Entry) Lidocaine applied to wound bed Edema Control - Lymphedema / Segmental Compressive Device / Other Elevate, Exercise Daily and A void Standing for Long Periods of Time. Elevate legs to the level of the heart and pump ankles as often as possible Elevate leg(s) parallel to the floor when sitting. Wound Treatment Wound #4  - Calcaneus Wound Laterality: Right Cleanser: Soap and Water 1 x Per Day/30 Days Discharge Instructions: Gently cleanse wound with antibacterial soap, rinse and pat dry prior to dressing wounds Prim Dressing: IODOFLEX 0.9% Cadexomer Iodine Pad ([place]) 1 x Per Day/30 Days ary Discharge Instructions: Apply Iodoflex to wound bed only as directed. Secondary Dressing: (BORDER) Zetuvit Plus SILICONE BORDER Dressing 4x4 (in/in) (Home Health) (Generic) 1 x Per Day/30 Days Discharge Instructions: Please do not put silicone bordered dressings under wraps. Use non-bordered dressing only. Wound #5 - Calcaneus Wound Laterality: Left Cleanser: Soap and Water 1 x Per Day/30 Days Discharge Instructions: Gently cleanse wound with antibacterial soap, rinse and pat dry prior to dressing wounds Prim Dressing: IODOFLEX 0.9% Cadexomer Iodine Pad ([place]) 1 x Per Day/30 Days ary Discharge Instructions: Apply Iodoflex to wound bed only as directed. Secondary Dressing: (BORDER) Zetuvit Plus SILICONE BORDER Dressing 4x4 (in/in) (Home Health) (Generic) 1 x Per Day/30 Days Discharge Instructions: Please do not put silicone bordered dressings under wraps. Use non-bordered dressing only. [name], [name] ([id number]) [id number] Page 4 of 5 Electronic Signature(s) Unsigned Entered By: [name] on 12/03/2022 11:45:57 -------------------------------------------------------------------------------- Problem List Details Patient Name: Date of Service: [name] 12/03/2022 11:15 A M Medical Record Number: [id number] Patient Account Number: [id number] Date of Birth/Sex: Treating RN: [name] (77 y.o. [name] Primary Care Provider: [place] [name] Other Clinician: Referring Provider: Treating Provider/Extender: [name] in Treatment: 1 Active Problems ICD-10 Encounter Code Description Active Date  MDM Diagnosis E10.621 Type 1 diabetes mellitus with foot ulcer 11/26/2022 No Yes I73.89 Other specified peripheral vascular diseases 11/26/2022 No Yes L89.610 Pressure ulcer of right heel, unstageable 11/26/2022 No Yes L89.620 Pressure ulcer of left heel, unstageable 11/26/2022 No Yes L98.412 Non-pressure chronic ulcer of buttock with fat layer exposed 11/26/2022 No Yes I25.10 Atherosclerotic heart disease of native coronary artery without angina pectoris 11/26/2022 No Yes I50.42 Chronic combined systolic (congestive) and diastolic (congestive) heart failure 11/26/2022 No Yes I10 Essential (primary) hypertension 11/26/2022 No Yes Inactive Problems Resolved Problems Electronic Signature(s) GERRY, BLANCHFIELD ([id number]) [id number] Page 5 of 5 Signed: 12/03/2022 11:19:54 AM By: [name] Entered By: Worthy [name] on 12/03/2022 11:19:53

## 2022-12-05 NOTE — Progress Notes (Signed)
[name], [name] ([id number]) [id number] Page 1 of 10 Visit Report for 12/03/2022 Arrival Information Details Patient Name: Date of Service: [name], [name] 12/03/2022 11:15 A M Medical Record Number: [id number] Patient Account Number: [id number] Date of Birth/Sex: Treating RN: November 11, 1946 (77 y.o. [name] Primary Care [name]: [name] Other Clinician: Referring [name]: Treating [name]/Extender: [name] in Treatment: 1 Visit Information History Since Last Visit Added or deleted any medications: No Patient Arrived: Wheel Chair Has Compression in Place as Prescribed: Yes Arrival Time: 11:12 Pain Present Now: No Accompanied By: Keturah Shavers, partner Transfer Assistance: Manual Patient Identification Verified: Yes Secondary Verification Process Completed: Yes Patient Requires Transmission-Based Precautions: No Patient Has Alerts: Yes Patient Alerts: Type I Diabetic eliquis Electronic Signature(s) Signed: 12/04/2022 5:47:34 PM By: [name], BSN, RN, CWS, Kim RN, BSN Entered By: [name], BSN, RN, CWS, [name] on 12/03/2022 11:16:01 -------------------------------------------------------------------------------- Encounter Discharge Information Details Patient Name: Date of Service: [name], [name] 12/03/2022 11:15 A M Medical Record Number: [id number] Patient Account Number: [id number] Date of Birth/Sex: Treating RN: 1946/09/18 (77 y.o. [name] Primary Care [name]: [name] Other Clinician: Referring [name]: Treating [name]/Extender: [name] in Treatment: 1 Encounter Discharge Information Items Post Procedure Vitals Discharge Condition: Stable Temperature (F): 97.8 Ambulatory Status: Walker Pulse (bpm): 97 Discharge Destination: Home Respiratory Rate (breaths/min): 16 Transportation: Private Auto Blood Pressure (mmHg): 118/88 Accompanied By: husband Schedule  Follow-up Appointment: Yes Clinical Summary of Care: Electronic Signature(s) [name] ([id number]) [phone].pdf Page 2 of 10 Signed: 12/04/2022 5:47:34 PM By: [name], BSN, RN, CWS, Kim RN, BSN Entered By: [name], BSN, RN, CWS, [name] on 12/03/2022 13:41:49 -------------------------------------------------------------------------------- Lower Extremity Assessment Details Patient Name: Date of Service: [name], [name] 12/03/2022 11:15 A M Medical Record Number: [id number] Patient Account Number: [id number] Date of Birth/Sex: Treating RN: August 17, 1946 (77 y.o. [name] Primary Care [name]: [name] Other Clinician: Referring [name]: Treating [name]/Extender: [name] in Treatment: 1 Edema Assessment Assessed: [Left: No] [Right: No] [Left: Edema] [Right: :] Calf Left: Right: Point of Measurement: 34 cm From Medial Instep 38.5 cm 37.2 cm Ankle Left: Right: Point of Measurement: 10 cm From Medial Instep 29 cm 27 cm Knee To Floor Left: Right: From Medial Instep 47 cm Vascular Assessment Pulses: Dorsalis Pedis Palpable: [Left:Yes] [Right:Yes] Notes Plus 2 pitting edema. Electronic Signature(s) Signed: 12/04/2022 5:47:34 PM By: [name], BSN, RN, CWS, Kim RN, BSN Entered By: [name], BSN, RN, CWS, [name] on 12/03/2022 11:39:00 -------------------------------------------------------------------------------- Multi Wound Chart Details Patient Name: Date of Service: [name], [name] 12/03/2022 11:15 A M Medical Record Number: [id number] Patient Account Number: [id number] [name], [name] ([id number]) [id number].pdf Page 3 of 10 Date of Birth/Sex: Treating RN: Aug 07, 1946 (77 y.o. [name], [name] Primary Care [name]: [name] Other Clinician: Referring [name]: Treating [name]/Extender: [name] in Treatment: 1 Vital Signs Height(in):  29 Pulse(bpm): 23 Weight(lbs): 190 Blood Pressure(mmHg): 130/64 Body Mass Index(BMI): 28.1 Temperature(F): 97.5 Respiratory Rate(breaths/min): 16 [4:Photos:] Right Calcaneus Left Calcaneus Sacrum Wound Location: Pressure Injury Pressure Injury Shear/Friction Wounding Event: Pressure Ulcer Pressure Ulcer Skin Tear Primary Etiology: N/A N/A Coronary Artery Disease, Comorbid History: Hypertension, Type I Diabetes, Osteoarthritis, Received Chemotherapy 10/20/2022 10/20/2022 11/11/2022 Date Acquired: '1 1 1 '$ Weeks of Treatment: Open Open Healed - Epithelialized Wound Status: No No No Wound Recurrence: 0.9x0.7x0.1 1.6x3x0.2 0x0x0 Measurements L x W x D (cm) 0.495 3.77 0 A (cm) : rea 0.049 0.754 0  Volume (cm) : 89.50% -700.40% 100.00% % Reduction in Area: 89.60% -1504.30% 100.00% % Reduction in Volume: Unstageable/Unclassified Unstageable/Unclassified Full Thickness Without Exposed Classification: Support Structures Medium Medium Medium Exudate A mount: Serosanguineous Serosanguineous Serosanguineous Exudate Type: red, brown red, brown red, brown Exudate Color: N/A N/A None Present (0%) Granulation A mount: N/A N/A None Present (0%) Necrotic A mount: N/A N/A Large (67-100%) Epithelialization: Treatment Notes Electronic Signature(s) Signed: 12/04/2022 5:47:34 PM By: [name], BSN, RN, CWS, Kim RN, BSN Entered By: [name], BSN, RN, CWS, [name] on 12/03/2022 11:40:22 -------------------------------------------------------------------------------- Multi-Disciplinary Care Plan Details Patient Name: Date of Service: [name], [name] 12/03/2022 11:15 A M Medical Record Number: [id number] Patient Account Number: [id number] Date of Birth/Sex: Treating RN: 1946/08/23 (77 y.o. [name] Primary Care Taheera Thomann: [name] Other Clinician: Referring [name]: Treating [name]/Extender: [name] in Treatment: [name], [name]  ([id number]) [id number] Page 4 of 10 Active Inactive Necrotic Tissue Nursing Diagnoses: Knowledge deficit related to management of necrotic/devitalized tissue Goals: Patient/caregiver will verbalize understanding of reason and process for debridement of necrotic tissue Date Initiated: 11/26/2022 Target Resolution Date: 12/03/2022 Goal Status: Active Interventions: Assess patient pain level pre-, during and post procedure and prior to discharge Notes: Pressure Nursing Diagnoses: Potential for impaired tissue integrity related to pressure, friction, moisture, and shear Goals: Patient will remain free of pressure ulcers Date Initiated: 11/26/2022 Target Resolution Date: 12/24/2022 Goal Status: Active Interventions: Assess: immobility, friction, shearing, incontinence upon admission and as needed Assess offloading mechanisms upon admission and as needed Assess potential for pressure ulcer upon admission and as needed Notes: Wound/Skin Impairment Nursing Diagnoses: Knowledge deficit related to ulceration/compromised skin integrity Goals: Patient/caregiver will verbalize understanding of skin care regimen Date Initiated: 11/26/2022 Target Resolution Date: 12/03/2022 Goal Status: Active Ulcer/skin breakdown will have a volume reduction of 30% by week 4 Date Initiated: 11/26/2022 Target Resolution Date: 12/24/2022 Goal Status: Active Ulcer/skin breakdown will have a volume reduction of 50% by week 8 Date Initiated: 11/26/2022 Target Resolution Date: 01/21/2023 Goal Status: Active Ulcer/skin breakdown will have a volume reduction of 80% by week 12 Date Initiated: 11/26/2022 Target Resolution Date: 02/18/2023 Goal Status: Active Ulcer/skin breakdown will heal within 14 weeks Date Initiated: 11/26/2022 Target Resolution Date: 03/18/2023 Goal Status: Active Interventions: Assess patient/caregiver ability to obtain necessary supplies Assess patient/caregiver ability  to perform ulcer/skin care regimen upon admission and as needed Assess ulceration(s) every visit Notes: Electronic Signature(s) Signed: 12/04/2022 5:47:34 PM By: [name], BSN, RN, CWS, Kim RN, BSN Entered By: [name], BSN, RN, CWS, [name] on 12/03/2022 13:41:12 [place], [name] ([id number]) [phone].pdf Page 5 of 10 -------------------------------------------------------------------------------- Pain Assessment Details Patient Name: Date of Service: [name], [name] 12/03/2022 11:15 A M Medical Record Number: [id number] Patient Account Number: [id number] Date of Birth/Sex: Treating RN: Aug 28, 1946 (77 y.o. [name] Primary Care Sharlot Sturkey: [name] Other Clinician: Referring Alois Colgan: Treating Kaylla Cobos/Extender: [name] in Treatment: 1 Active Problems Location of Pain Severity and Description of Pain Patient Has Paino No Site Locations Pain Management and Medication Current Pain Management: Electronic Signature(s) Signed: 12/04/2022 5:47:34 PM By: [name], BSN, RN, CWS, Kim RN, BSN Entered By: [name], BSN, RN, CWS, [name] on 12/03/2022 11:16:37 -------------------------------------------------------------------------------- Patient/Caregiver Education Details Patient Name: Date of Service: [name], [name] 1/23/2024andnbsp11:15 A M Medical Record Number: [id number] Patient Account Number: [id number] Date of Birth/Gender: Treating RN: 1946/02/27 (77 y.o. [name] Primary Care Physician: [name] Other Clinician: Referring Physician: Treating Physician/Extender: [name]  [name] Weeks in Treatment: 1 [name], [name] ([id number]) [id number] Page 6 of 10 Education Assessment Education Provided To: Caregiver Education Topics Provided Wound Debridement: Handouts: Wound Debridement Methods: Demonstration, Explain/Verbal Responses: State content correctly Wound/Skin  Impairment: Handouts: Caring for Your Ulcer Methods: Demonstration, Explain/Verbal Responses: State content correctly Electronic Signature(s) Signed: 12/04/2022 5:47:34 PM By: [name], BSN, RN, CWS, Kim RN, BSN Entered By: [name], BSN, RN, CWS, [name] on 12/03/2022 13:39:35 -------------------------------------------------------------------------------- Wound Assessment Details Patient Name: Date of Service: [name], [name] 12/03/2022 11:15 A M Medical Record Number: [id number] Patient Account Number: [id number] Date of Birth/Sex: Treating RN: 1946/10/10 (77 y.o. [name], [name] Primary Care Wai Litt: [name] Other Clinician: Referring Anaijah Augsburger: Treating Cordarryl Monrreal/Extender: [name] in Treatment: 1 Wound Status Wound Number: 4 Primary Etiology: Pressure Ulcer Wound Location: Right Calcaneus Wound Status: Open Wounding Event: Pressure Injury Date Acquired: 10/20/2022 Weeks Of Treatment: 1 Clustered Wound: No Photos Photo Uploaded By: [name], BSN, RN, CWS, [name] on 12/03/2022 11:40:05 Wound Measurements Length: (cm) 0.9 Width: (cm) 0.7 Depth: (cm) 0.1 Area: (cm) 0.49 Volume: (cm) 0.04 Madani, [name] ([id number]) % Reduction in Area: 89.5% % Reduction in Volume: 89.6% 5 9 [id number] Page 7 of 10 Wound Description Classification: Unstageable/Unclassified Exudate Amount: Medium Exudate Type: Serosanguineous Exudate Color: red, brown Treatment Notes Wound #4 (Calcaneus) Wound Laterality: Right Cleanser Soap and Water Discharge Instruction: Gently cleanse wound with antibacterial soap, rinse and pat dry prior to dressing wounds Peri-Wound Care Topical Primary Dressing IODOFLEX 0.9% Cadexomer Iodine Pad Discharge Instruction: Apply Iodoflex to wound bed only as directed. Secondary Dressing (BORDER) Zetuvit Plus SILICONE BORDER Dressing 4x4 (in/in) Discharge Instruction: Please do not put silicone bordered  dressings under wraps. Use non-bordered dressing only. Secured With Compression Wrap Compression Stockings Environmental education officer) Signed: 12/04/2022 5:47:34 PM By: [name], BSN, RN, CWS, Kim RN, BSN Entered By: [name], BSN, RN, CWS, [name] on 12/03/2022 11:25:16 -------------------------------------------------------------------------------- Wound Assessment Details Patient Name: Date of Service: [name], [name] 12/03/2022 11:15 A M Medical Record Number: [id number] Patient Account Number: [id number] Date of Birth/Sex: Treating RN: February 24, 1946 (77 y.o. [name] Primary Care Effie Wahlert: [name] Other Clinician: Referring Azayla Polo: Treating Ardeth Repetto/Extender: [name] in Treatment: 1 Wound Status Wound Number: 5 Primary Etiology: Pressure Ulcer Wound Location: Left Calcaneus Wound Status: Open Wounding Event: Pressure Injury Date Acquired: 10/20/2022 Weeks Of Treatment: 1 Clustered Wound: No Photos [name], [name] ([id number]) [phone].pdf Page 8 of 10 Photo Uploaded By: [name], BSN, RN, CWS, [name] on 12/03/2022 11:40:05 Wound Measurements Length: (cm) 1.6 Width: (cm) 3 Depth: (cm) 0.2 Area: (cm) 3.77 Volume: (cm) 0.754 % Reduction in Area: -700.4% % Reduction in Volume: -1504.3% Wound Description Classification: Unstageable/Unclassified Exudate Amount: Medium Exudate Type: Serosanguineous Exudate Color: red, brown Treatment Notes Wound #5 (Calcaneus) Wound Laterality: Left Cleanser Soap and Water Discharge Instruction: Gently cleanse wound with antibacterial soap, rinse and pat dry prior to dressing wounds Peri-Wound Care Topical Primary Dressing IODOFLEX 0.9% Cadexomer Iodine Pad Discharge Instruction: Apply Iodoflex to wound bed only as directed. Secondary Dressing (BORDER) Zetuvit Plus SILICONE BORDER Dressing 4x4 (in/in) Discharge Instruction: Please do not put silicone bordered dressings  under wraps. Use non-bordered dressing only. Secured With Compression Wrap Compression Stockings Environmental education officer) Signed: 12/04/2022 5:47:34 PM By: [name], BSN, RN, CWS, Kim RN, BSN Entered By: [name], BSN, RN, CWS, [name] on 12/03/2022 11:25:16 -------------------------------------------------------------------------------- Wound Assessment Details Patient Name: Date of Service: [name], [name] 12/03/2022 11:15 A  [name], [place] ([id number]) [id number] Page 9 of 10 Medical Record Number: [id number] Patient Account Number: [id number] Date of Birth/Sex: Treating RN: 1946/02/05 (77 y.o. [name], [name] Primary Care Etienne Millward: [name] Other Clinician: Referring Kyden Potash: Treating Kennady Zimmerle/Extender: [name] in Treatment: 1 Wound Status Wound Number: 6 Primary Skin Tear Etiology: Wound Location: Sacrum Wound Healed - Epithelialized Wounding Event: Shear/Friction Status: Date Acquired: 11/11/2022 Comorbid Coronary Artery Disease, Hypertension, Type I Diabetes, Weeks Of Treatment: 1 History: Osteoarthritis, Received Chemotherapy Clustered Wound: No Photos Wound Measurements Length: (cm) Width: (cm) Depth: (cm) Area: (cm) Volume: (cm) 0 % Reduction in Area: 100% 0 % Reduction in Volume: 100% 0 Epithelialization: Large (67-100%) 0 0 Wound Description Classification: Full Thickness Without Exposed Support Exudate Amount: Medium Exudate Type: Serosanguineous Exudate Color: red, brown Structures Foul Odor After Cleansing: No Slough/Fibrino Yes Wound Bed Granulation Amount: None Present (0%) Exposed Structure Necrotic Amount: None Present (0%) Fascia Exposed: No Fat Layer (Subcutaneous Tissue) Exposed: Yes Tendon Exposed: No Muscle Exposed: No Joint Exposed: No Bone Exposed: No Treatment Notes Wound #6 (Sacrum) Cleanser Peri-Wound Care Topical Primary Dressing Secondary  Dressing Secured With Compression Wrap Compression Stockings Add-Ons Electronic Signature(s) Signed: 12/04/2022 5:47:34 PM By: [name], BSN, RN, CWS, Kim RN, BSN Entered By: [name], BSN, RN, CWS, [name] on 12/03/2022 11:38:10 [place], [name] ([id number]) [phone].pdf Page 10 of 10 -------------------------------------------------------------------------------- Vitals Details Patient Name: Date of Service: [name], [name] 12/03/2022 11:15 A M Medical Record Number: [id number] Patient Account Number: [id number] Date of Birth/Sex: Treating RN: 1946/02/15 (77 y.o. [name], [name] Primary Care Saman Giddens: [name] Other Clinician: Referring Kimmy Totten: Treating Teyon Odette/Extender: [name] in Treatment: 1 Vital Signs Time Taken: 11:16 Temperature (F): 97.5 Height (in): 69 Pulse (bpm): 87 Weight (lbs): 190 Respiratory Rate (breaths/min): 16 Body Mass Index (BMI): 28.1 Blood Pressure (mmHg): 130/64 Reference Range: 80 - 120 mg / dl Electronic Signature(s) Signed: 12/04/2022 5:47:34 PM By: [name], BSN, RN, CWS, Kim RN, BSN Entered By: [name], BSN, RN, CWS, [name] on 12/03/2022 11:16:28

## 2022-12-05 NOTE — Telephone Encounter (Signed)
Attempted to contact the patient x 2 attempts. The phone would ring x 3-4 rings both times, call picked up and then dropped.   Will attempt to call back at a later time.

## 2022-12-05 NOTE — Telephone Encounter (Signed)
[name], MD  Sent: Wed December 04, 2022  7:13 PM  To: [facility]         Message  He is welcome to try Lasix 20 mg daily with potassium daily for 10 days  At which time I would repeat BMP lab work to make sure renal function is stable  Would also monitor blood pressure on the Lasix as we are coming off the midodrine and need to make sure blood pressure does not run too low  Thx  TGollan

## 2022-12-06 ENCOUNTER — Encounter (INDEPENDENT_AMBULATORY_CARE_PROVIDER_SITE_OTHER): Payer: Self-pay

## 2022-12-09 ENCOUNTER — Telehealth: Payer: Self-pay | Admitting: Cardiovascular Disease

## 2022-12-09 DIAGNOSIS — E109 Type 1 diabetes mellitus without complications: Secondary | ICD-10-CM | POA: Diagnosis not present

## 2022-12-09 DIAGNOSIS — E1065 Type 1 diabetes mellitus with hyperglycemia: Secondary | ICD-10-CM | POA: Diagnosis not present

## 2022-12-09 NOTE — Telephone Encounter (Signed)
The patient had called last week with complaints of swelling to his hands and legs. Recommendations received from Dr. Rockey Situ on 12/04/22: [name], MD  to [facility]     12/04/22  7:14 PM  He is welcome to try Lasix 20 mg daily with potassium daily for 10 days At which time I would repeat BMP lab work to make sure renal function is stable Would also monitor blood pressure on the Lasix as we are coming off the midodrine and need to make sure blood pressure does not run too low Thx TGollan  We attempted to contact the patient last week, although unsuccessful.

## 2022-12-09 NOTE — Telephone Encounter (Signed)
See phone note additional phone note dated for today regarding this same issue and 1 new issue.   Closing this encounter.

## 2022-12-09 NOTE — Telephone Encounter (Signed)
Pt c/o medication issue:  1. Name of Medication: apixaban (ELIQUIS) 5 MG TABS tablet   2. How are you currently taking this medication (dosage and times per day)? Not currently taking   3. Are you having a reaction (difficulty breathing--STAT)? no  4. What is your medication issue? Patient states he was taking eliquis but his PCP said to stop the medication due to bruising and bleeding easily. He says he was prescribed it from the hospital. He says his PCP then told him to restart it but he would like to know if the dose can be lowered.   Pt c/o swelling: STAT is pt has developed SOB within 24 hours  If swelling, where is the swelling located? Abnormal swelling in feet ankles and legs  How much weight have you gained and in what time span? Weight drops when he takes lasix  Have you gained 3 pounds in a day or 5 pounds in a week? no  Do you have a log of your daily weights (if so, list)? Jan 26th 187.1 lbs, Jan 28th 185.1 lbs, Jan 22th 189 lbs, Jan 23th 189.9 lbs, Jan 25th 187.2 lbs, today 184.4 lbs  Are you currently taking a fluid pill? yes  Are you currently SOB? no  Have you traveled recently? No   Patient states he has abnormal swelling in his feet, ankles and legs. There is some swelling in his hands but his caretaker states it is not too bad. He states the patient takes 1 lasix in the morning and on Thursday of last week he took 2, because the Home Health nurse recommended it. He states the patient also stopped taking his BP medication 1/25.

## 2022-12-09 NOTE — Telephone Encounter (Signed)
I called and spoke with the patient today.   Issue # 1: He reports improved swelling to the hands, but some continued swelling to the lower extremities.  Weights: 1/22- 189 lbs 1/23- 189.9 lbs 1/25- 187.2 lbs 1/26- 187.1 lbs 1/28- 185.1 lbs 1/29- 184.4 lbs  -He has been taking lasix 20 mg once daily & potassium 20 meq once daily since Thursday (1/25). -I have advised him of Dr. Donivan Scull initial recommendations as stated below to continue lasix 20 mg once daily & potassium 20 meq once daily x 10 days. -Repeat a BMP in 10 days.  Blood pressures: Stopped midodrine on 1/25 1/25- 122/64 (after waking up) & 156/73 (after walking) 1/26- 175/93 (after waking up) 1/27- 154/69 (after waking up) & 158/86 (after walking)  1/28- 137/68 (after waking up) & 158/86 (after walking) 1/29- 170/82 (after waking up) & 116/64 (after standing)   The patient is aware we will update Dr. Rockey Situ of his BP readings and weights and confirm if any additional medication changes are recommended or if he will just continue lasix & potassium x 10 days, then repeat a BMP.      Issue # 2: The patient is currently on Eliquis  5 mg BID for a small DVT that was found. He reports multiple knee surgeries.  - He report that he is bruising easily and his skin is tearing and he is bleeding very easily - He reached out to his PCP office and was told to stop this, then today he states he received a call to resume Eliquis 5 mg BID.  - I have advised the patient that his PCP will need to continue to advise on this, but he patient would like Dr. Donivan Scull input if the dose could be lowered at some point. - I also advised the patient that I did not think that the long term dose for DVT for Eliquis is 5 mg BID, but will obtain feedback from Dr. Rockey Situ and let him know.  The patient will continue all medications as he is currently taking at this time. He is aware we will call back with any further recommendations once received  from Dr. Rockey Situ.

## 2022-12-10 ENCOUNTER — Encounter: Payer: Medicare HMO | Admitting: Physician Assistant

## 2022-12-10 DIAGNOSIS — L89620 Pressure ulcer of left heel, unstageable: Secondary | ICD-10-CM | POA: Diagnosis not present

## 2022-12-10 DIAGNOSIS — L98412 Non-pressure chronic ulcer of buttock with fat layer exposed: Secondary | ICD-10-CM | POA: Diagnosis not present

## 2022-12-10 DIAGNOSIS — E1051 Type 1 diabetes mellitus with diabetic peripheral angiopathy without gangrene: Secondary | ICD-10-CM | POA: Diagnosis not present

## 2022-12-10 DIAGNOSIS — I2510 Atherosclerotic heart disease of native coronary artery without angina pectoris: Secondary | ICD-10-CM | POA: Diagnosis not present

## 2022-12-10 DIAGNOSIS — E10622 Type 1 diabetes mellitus with other skin ulcer: Secondary | ICD-10-CM | POA: Diagnosis not present

## 2022-12-10 DIAGNOSIS — I110 Hypertensive heart disease with heart failure: Secondary | ICD-10-CM | POA: Diagnosis not present

## 2022-12-10 DIAGNOSIS — L89610 Pressure ulcer of right heel, unstageable: Secondary | ICD-10-CM | POA: Diagnosis not present

## 2022-12-10 DIAGNOSIS — I5042 Chronic combined systolic (congestive) and diastolic (congestive) heart failure: Secondary | ICD-10-CM | POA: Diagnosis not present

## 2022-12-10 NOTE — Progress Notes (Addendum)
ROBIN, PAFFORD ([id number]) [id number] Page 1 of 9 Visit Report for 12/10/2022 Chief Complaint Document Details Patient Name: Date of Service: [name] 12/10/2022 1:15 PM Medical Record Number: [id number] Patient Account Number: [id number] Date of Birth/Sex: Treating RN: [birth year] (77 y.o. [name] Primary Care Provider: [place] [name] Other Clinician: Referring Provider: Treating Provider/Extender: [name] in Treatment: 2 Information Obtained from: Patient Chief Complaint Bilateral heel pressure ulcers and sacral pressure ulcer Electronic Signature(s) Signed: 12/10/2022 1:10:23 PM By: [name] Entered By: Worthy Keeler on 12/10/2022 13:10:23 -------------------------------------------------------------------------------- Debridement Details Patient Name: Date of Service: [name], [name] 12/10/2022 1:15 PM Medical Record Number: [id number] Patient Account Number: [id number] Date of Birth/Sex: Treating RN: 11-23-1945 (77 y.o. [name] Primary Care Provider: [place] [name] Other Clinician: Referring Provider: Treating Provider/Extender: [name] in Treatment: 2 Debridement Performed for Assessment: Wound #5 Left Calcaneus Performed By: Physician [name], [name] Debridement Type: Chemical/Enzymatic/Mechanical Agent Used: saline gauze Level of Consciousness (Pre-procedure): Awake and Alert Pre-procedure Verification/Time Out Yes - 13:50 Taken: Start Time: 13:50 Pain Control: Lidocaine 4% T opical Solution Instrument: Other : Saline gauze Bleeding: Minimum Hemostasis Achieved: Pressure Response to Treatment: Procedure was tolerated well Level of Consciousness (Post- Awake and Alert procedure): Post Debridement Measurements of Total Wound Length: (cm) 1.7 Stage: Unstageable/Unclassified [name] ([id number])  [id number] Page 2 of 9 Width: (cm) 2.5 Depth: (cm) 0.3 Volume: (cm) 1.001 Character of Wound/Ulcer Post Debridement: Stable Post Procedure Diagnosis Same as Pre-procedure Electronic Signature(s) Signed: 12/10/2022 4:07:19 PM By: [name] Loud MSN RN CNS WTA Signed: 12/10/2022 4:45:54 PM By: [name] Entered By: [name] on 12/10/2022 16:07:19 -------------------------------------------------------------------------------- HPI Details Patient Name: Date of Service: [name], [name] 12/10/2022 1:15 PM Medical Record Number: [id number] Patient Account Number: [id number] Date of Birth/Sex: Treating RN: Mar 29, 1946 (77 y.o. [name] Primary Care Provider: [place] [name] Other Clinician: Referring Provider: Treating Provider/Extender: [name] in Treatment: 2 History of Present Illness Location: right anterior shin Quality: Patient reports No Pain. Severity: Patient states wound are getting worse. Duration: Patient has had the wound for > 6 months prior to seeking treatment at the wound center Context: The wound appeared gradually over time Modifying Factors: Other treatment(s) tried include: taken doxycycline and use bacitracin ssociated Signs and Symptoms: Patient reports presence of swelling A HPI Description: 77 year old male recently seen by his PCP Dr. Tawnya [name] who saw him for a wound on the right leg which is less tender and he is continuing to use bacitracin and has completed her course of doxycycline. Past medical history of coronary artery disease, diabetes mellitus type 1, hyperlipidemia, hypertension, MI, plantar fascial fibromatosis, pneumonia, status post foot surgery due to trauma on the left side, coronary angioplasty. he is a former smoker and quit in July 1996. last hemoglobin A1c was 7.8 % in April of this year 5/22 2017 -- biopsy of the right lower extremity was sent for pathology  and the report is that of a basal cell carcinoma with edges of the biopsy are involved. I have called the patient over the phone( 03/28/16) and discussed the pathology report with him and he is agreeable to see a surgeon for excision and need full. I have also spoken personally to [name], of [name] and referred the patient for further wide excision of this lesion and have communicated this to the patient. READMISSION 08/12/18  This is a now 77 year old man with type 1 diabetes. Hemoglobin A1c on 4/29 was 7.9. He is listed as having a history of PAD in the [facility] records although the patient doesn't recall this, doesn't complain of any symptoms of claudication and doesn't believe he has had any prior noninvasive arterial test. He is a nonsmoker. He was here in 2017 with a wound on his Right lower leg. This was biopsied in the clinic and pathology showed a basal cell carcinoma. He went on to have surgery on this area this is currently where I believe his current wounds are located. He generally bumped the lateral part of his right calf 2 weeks ago and has a superficial abrasion. He also has 2 small open areas on the medial part of the tibia in the same area. Our intake nurse noted a stitch coming out of this which was removed. These of the wound sees actually come to the clinic for. However the more concerning area is an area on the tip of the left great toe. He says that this was initially traumatic and he's been to see Dr. Sammuel [name] podiatrist. [name] been using what I think is Santyl to the wound tip. This has some depth. ABIs in this clinic were non-obtainable on the right and 1.84 on the left. 08/19/18; the patient had arterial studies that did not show evidence of significant hemodynamically compromising occlusions in either leg. There was mild medial atherosclerosis bilaterally. His resting ABIs were within the normal limits at 1.3 on the right and 1.4 on the left. There was normal  posterior and anterior tibial artery waveforms and velocities listed on both sides. The patient arrives with what appears to be a healthy surface over the tip of his left great toe. This is indeed surprising. Still looks somewhat friable however. More concerning is the x-ray that we did that showed erosions of the tuft of the distal phalanx the left great toe consistent with osteomyelitis. I attempted to pull this x-ray up on colon health Link however I can't open the film to look at this myself. 08/26/18; I reviewed the patient's x-ray and colon helpline. Indeed there is fairly obvious erosion of the tip of his left great toe. The wound was initially a traumatic area hitting it sometime in the middle of night in his kitchen. He is a type I diabetic. I have him on Flagyl and Levaquin that I started last week i.e. 7 days ago. He has an appointment with infectious disease on 09/01/18 [name], [name] ([id number]) [id number] Page 3 of 9 09/02/18; the patient was seen by Dr. Delaine Lame of infectious disease. She did not feel the patient needed IV antibiotics. I do feel he needs further oral antibiotics however. He does not need anymore Flagyl but he does need another 2 weeks of Levaquin. The areas just about closed. 09/16/18; the patient is completing his Levaquin today. There is no open wound on the tip of the toe Readmission: 11-26-2022 upon evaluation today patient presents for initial inspection here in our clinic concerning issues that he has been having after having been hospitalized due to septic arthritis. Subsequently he has a PICC line that is been removed and he has been discharged from infectious disease in that regard. Unfortunately he developed shearing wounds over the gluteal region as well as a pressure ulcer over each heel although they are not looking extremely bad nonetheless they do seem to be evidence of pressure which she sustained while hospitalized.  Obviously he  was very sick and is still recovering as far as that is concerned. Fortunately there does not appear to be any evidence of active infection systemically which is good news at this point according to Dr. Ardyth Gal. Patient's wounds also do not appear to be too bad at this point which is good news. Patient does have a history of type 1 diabetes mellitus, peripheral vascular disease, coronary artery disease, congestive heart failure, hypertension, and he is on long-term anticoagulant therapy. 12-03-2022 upon evaluation today patient appears to be doing better to some degree in regard to his ulcers on his heels at this point. We are still waiting on the actual appointment with the vascular surgeons but nonetheless in the meantime I do believe that he is making some good progress currently with regard to loosen up the eschar. I did actually feel like that there was some ability for [place] to remove some of the necrotic tissue today and I discussed that with the patient. He is in agreement with that plan. We also can probably see about making an adjustment in the treatment regimen. 12-10-2022 upon evaluation today patient's wounds were really doing about the same. Again he is still awaiting the evaluation with the vascular surgeon. That should have already been done but had to be pushed back unfortunately. He will be seeing them next week on the seventh. I plan to see him following somewhere around the end of the week I think will probably be best. The patient voiced understanding. Electronic Signature(s) Signed: 12/10/2022 2:33:32 PM By: [name] Entered By: Worthy Keeler on 12/10/2022 14:33:31 -------------------------------------------------------------------------------- Physical Exam Details Patient Name: Date of Service: [name], [name] 12/10/2022 1:15 PM Medical Record Number: [id number] Patient Account Number: [id number] Date of Birth/Sex: Treating RN: 11/30/1945 (77  y.o. [name] Primary Care Provider: [place] [name] Other Clinician: Referring Provider: Treating Provider/Extender: [name] in Treatment: 2 Constitutional Well-nourished and well-hydrated in no acute distress. Respiratory normal breathing without difficulty. Psychiatric this patient is able to make decisions and demonstrates good insight into disease process. Alert and Oriented x 3. pleasant and cooperative. Notes Upon inspection patient's wound bed actually showed signs of showing a little bit of improvement though there are still quite a bit of necrotic debris on the surface of the wound we will going to keep working on this and trying to get this to clean up. We could consider Santyl as well and trying to avoid too much in the way of sharp debridement due to the fact that I do not know how well his blood flow is currently. Electronic Signature(s) Signed: 12/10/2022 2:33:54 PM By: [name] Entered By: Worthy Keeler on 12/10/2022 14:33:54 Witherow, [name] ([id number]) [id number] Page 4 of 9 -------------------------------------------------------------------------------- Physician Orders Details Patient Name: Date of Service: [name], [name] 12/10/2022 1:15 PM Medical Record Number: [id number] Patient Account Number: [id number] Date of Birth/Sex: Treating RN: Sep 04, 1946 (77 y.o. 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 [facility] for wound care. May utilize formulary equivalent dressing for wound treatment orders unless otherwise specified. Home Health Nurse may visit [facility] to address patients wound care needs. Alvis Lemmings fax [phone] Bathing/ Shower/ Hygiene May shower; gently cleanse wound with antibacterial soap, rinse and pat dry prior to dressing wounds Anesthetic (Use 'Patient Medications' Section for Anesthetic Order Entry) Lidocaine applied to wound bed Edema Control - Lymphedema / Segmental Compressive Device / Other Elevate, Exercise Daily and A void Standing for Long Periods of Time. Elevate legs to the level of the heart and pump ankles as often as possible Elevate leg(s) parallel to the floor when sitting. Wound Treatment Wound #4 - Calcaneus Wound Laterality: Right Cleanser: Soap and Water 1 x Per Day/30 Days Discharge Instructions: Gently cleanse wound with antibacterial soap, rinse and pat dry prior to dressing wounds Prim Dressing: IODOFLEX 0.9% Cadexomer Iodine Pad (Generic) 1 x Per Day/30 Days ary Discharge Instructions: Apply Iodoflex to wound bed only as directed. Secondary Dressing: (BORDER) Zetuvit Plus SILICONE BORDER Dressing 4x4 (in/in) (Generic) 1 x Per Day/30 Days Discharge Instructions: Please do not put silicone bordered dressings under wraps. Use non-bordered dressing only. Wound #5 - Calcaneus Wound Laterality: Left Cleanser: Soap and Water 1 x Per Day/30 Days Discharge Instructions: Gently cleanse wound with antibacterial soap, rinse and pat dry prior to dressing wounds Prim Dressing: IODOFLEX 0.9% Cadexomer Iodine Pad (DME) (Generic) 1 x Per Day/30 Days ary Discharge Instructions: Apply  Iodoflex to wound bed only as directed. Secondary Dressing: (BORDER) Zetuvit Plus SILICONE BORDER Dressing 4x4 (in/in) (DME) (Generic) 1 x Per Day/30 Days Discharge Instructions: Please do not put silicone bordered dressings under wraps. Use non-bordered dressing only. [name], [name] ([id number]) [id number] Page 5 of 9 Electronic Signature(s) Signed: 12/10/2022 4:47:32 PM By: [name] Loud MSN RN CNS WTA Signed: 12/10/2022 5:16:25 PM By: [name] Previous Signature: 12/10/2022 4:15:30 PM Version By: [name] Loud MSN RN CNS WTA Previous Signature: 12/10/2022 4:45:54 PM Version By: [name] Entered By: [name] on 12/10/2022 16:47:09 -------------------------------------------------------------------------------- Problem List Details Patient Name: Date of Service: [name], [name] 12/10/2022 1:15 PM Medical Record Number: [id number] Patient Account Number: [id number] Date of Birth/Sex: Treating RN: 1946/03/04 (77 y.o. [name] Primary Care Provider: [place] [name] Other Clinician: Referring Provider: Treating Provider/Extender: [name] in Treatment: 2 Active Problems ICD-10 Encounter Code Description Active Date MDM Diagnosis E10.621 Type 1 diabetes mellitus with foot ulcer 11/26/2022 No Yes I73.89 Other specified peripheral vascular diseases 11/26/2022 No Yes L89.610 Pressure ulcer of right heel, unstageable 11/26/2022 No Yes L89.620 Pressure ulcer of left heel, unstageable 11/26/2022 No Yes L98.412 Non-pressure chronic ulcer of buttock with fat layer exposed 11/26/2022 No Yes I25.10 Atherosclerotic heart disease of native coronary artery without angina pectoris 11/26/2022 No Yes I50.42 Chronic combined systolic (congestive) and diastolic (congestive) heart failure 11/26/2022 No Yes I10 Essential (primary) hypertension 11/26/2022 No Yes Inactive Problems Resolved Problems Electronic  Signature(s) [name], [name] ([id number]) [id number] Page 6 of 9 Signed: 12/10/2022 4:11:20 PM By: [name] Loud MSN RN CNS WTA Signed: 12/10/2022 4:45:54 PM By: [name] Previous Signature: 12/10/2022 1:10:19 PM Version By: [name] Entered By: [name] on 12/10/2022 16:11:20 -------------------------------------------------------------------------------- Progress Note Details Patient Name: Date of Service: [name], [name] 12/10/2022 1:15 PM Medical Record Number: [id number] Patient Account Number: [id number]  Date of Birth/Sex: Treating RN: [birth year] (77 y.o. [name] Primary Care Provider: [place] [name] Other Clinician: Referring Provider: Treating Provider/Extender: [name] in Treatment: 2 Subjective Chief Complaint Information obtained from Patient Bilateral heel pressure ulcers and sacral pressure ulcer History of Present Illness (HPI) The following HPI elements were documented for the patient's wound: Location: right anterior shin Quality: Patient reports No Pain. Severity: Patient states wound are getting worse. Duration: Patient has had the wound for > 6 months prior to seeking treatment at the wound center Context: The wound appeared gradually over time Modifying Factors: Other treatment(s) tried include: taken doxycycline and use bacitracin Associated Signs and Symptoms: Patient reports presence of swelling 77 year old male recently seen by his PCP Dr. Tawnya [name] who saw him for a wound on the right leg which is less tender and he is continuing to use bacitracin and has completed her course of doxycycline. Past medical history of coronary artery disease, diabetes mellitus type 1, hyperlipidemia, hypertension, MI, plantar fascial fibromatosis, pneumonia, status post foot surgery due to trauma on the left side, coronary angioplasty. he is a former smoker and quit in July  1996. last hemoglobin A1c was 7.8 % in April of this year 5/22 2017 -- biopsy of the right lower extremity was sent for pathology and the report is that of a basal cell carcinoma with edges of the biopsy are involved. I have called the patient over the phone( 03/28/16) and discussed the pathology report with him and he is agreeable to see a surgeon for excision and need full. I have also spoken personally to [name], of [facility] and referred the patient for further wide excision of this lesion and have communicated this to the patient. READMISSION 08/12/18 This is a now 77 year old man with type 1 diabetes. Hemoglobin A1c on 4/29 was 7.9. He is listed as having a history of PAD in the [name] records although the patient doesn't recall this, doesn't complain of any symptoms of claudication and doesn't believe he has had any prior noninvasive arterial test. He is a nonsmoker. He was here in 2017 with a wound on his Right lower leg. This was biopsied in the clinic and pathology showed a basal cell carcinoma. He went on to have surgery on this area this is currently where I believe his current wounds are located. He generally bumped the lateral part of his right calf 2 weeks ago and has a superficial abrasion. He also has 2 small open areas on the medial part of the tibia in the same area. Our intake nurse noted a stitch coming out of this which was removed. These of the wound sees actually come to the clinic for. However the more concerning area is an area on the tip of the left great toe. He says that this was initially traumatic and he's been to see Dr. Sammuel [name] podiatrist. [name] been using what I think is Santyl to the wound tip. This has some depth. ABIs in this clinic were non-obtainable on the right and 1.84 on the left. 08/19/18; the patient had arterial studies that did not show evidence of significant hemodynamically compromising occlusions in either leg. There was mild medial  atherosclerosis bilaterally. His resting ABIs were within the normal limits at 1.3 on the right and 1.4 on the left. There was normal posterior and anterior tibial artery waveforms and velocities listed on both sides. The patient arrives with what appears to be a healthy surface over the tip  of his left great toe. This is indeed surprising. Still looks somewhat friable however. More concerning is the x-ray that we did that showed erosions of the tuft of the distal phalanx the left great toe consistent with osteomyelitis. I attempted to pull this x-ray up on colon health Link however I can't open the film to look at this myself. 08/26/18; I reviewed the patient's x-ray and colon helpline. Indeed there is fairly obvious erosion of the tip of his left great toe. The wound was initially a traumatic area hitting it sometime in the middle of night in his kitchen. He is a type I diabetic. I have him on Flagyl and Levaquin that I started last week i.e. 7 days ago. He has an appointment with infectious disease on 09/01/18 09/02/18; the patient was seen by Dr. Delaine Lame of infectious disease. She did not feel the patient needed IV antibiotics. I do feel he needs further oral antibiotics however. He does not need anymore Flagyl but he does need another 2 weeks of Levaquin. The areas just about closed. 09/16/18; the patient is completing his Levaquin today. There is no open wound on the tip of the toe Readmission: [name], [name] ([id number]) [id number] Page 7 of 9 11-26-2022 upon evaluation today patient presents for initial inspection here in our clinic concerning issues that he has been having after having been hospitalized due to septic arthritis. Subsequently he has a PICC line that is been removed and he has been discharged from infectious disease in that regard. Unfortunately he developed shearing wounds over the gluteal region as well as a pressure ulcer over each heel  although they are not looking extremely bad nonetheless they do seem to be evidence of pressure which she sustained while hospitalized. Obviously he was very sick and is still recovering as far as that is concerned. Fortunately there does not appear to be any evidence of active infection systemically which is good news at this point according to Dr. Ardyth Gal. Patient's wounds also do not appear to be too bad at this point which is good news. Patient does have a history of type 1 diabetes mellitus, peripheral vascular disease, coronary artery disease, congestive heart failure, hypertension, and he is on long-term anticoagulant therapy. 12-03-2022 upon evaluation today patient appears to be doing better to some degree in regard to his ulcers on his heels at this point. We are still waiting on the actual appointment with the vascular surgeons but nonetheless in the meantime I do believe that he is making some good progress currently with regard to loosen up the eschar. I did actually feel like that there was some ability for [place] to remove some of the necrotic tissue today and I discussed that with the patient. He is in agreement with that plan. We also can probably see about making an adjustment in the treatment regimen. 12-10-2022 upon evaluation today patient's wounds were really doing about the same. Again he is still awaiting the evaluation with the vascular surgeon. That should have already been done but had to be pushed back unfortunately. He will be seeing them next week on the seventh. I plan to see him following somewhere around the end of the week I think will probably be best. The patient voiced understanding. Objective Constitutional Well-nourished and well-hydrated in no acute distress. Vitals Time Taken: 1:42 PM, Height: 69 in, Weight: 190 lbs, BMI: 28.1, Temperature: 98.2 F, Pulse: 92 bpm, Respiratory Rate: 16 breaths/min, Blood Pressure: 151/75 mmHg. Respiratory normal breathing  without difficulty.  Psychiatric this patient is able to make decisions and demonstrates good insight into disease process. Alert and Oriented x 3. pleasant and cooperative. General Notes: Upon inspection patient's wound bed actually showed signs of showing a little bit of improvement though there are still quite a bit of necrotic debris on the surface of the wound we will going to keep working on this and trying to get this to clean up. We could consider Santyl as well and trying to avoid too much in the way of sharp debridement due to the fact that I do not know how well his blood flow is currently. Integumentary (Hair, Skin) Wound #4 status is Open. Original cause of wound was Pressure Injury. The date acquired was: 10/20/2022. The wound has been in treatment 2 weeks. The wound is located on the Right Calcaneus. The wound measures 0.5cm length x 1.5cm width x 0.1cm depth; 0.589cm^2 area and 0.059cm^3 volume. There is a medium amount of serosanguineous drainage noted. Wound #5 status is Open. Original cause of wound was Pressure Injury. The date acquired was: 10/20/2022. The wound has been in treatment 2 weeks. The wound is located on the Left Calcaneus. The wound measures 1.7cm length x 2.5cm width x 0.3cm depth; 3.338cm^2 area and 1.001cm^3 volume. There is a medium amount of serosanguineous drainage noted. Assessment Active Problems ICD-10 Type 1 diabetes mellitus with foot ulcer Other specified peripheral vascular diseases Pressure ulcer of right heel, unstageable Pressure ulcer of left heel, unstageable Non-pressure chronic ulcer of buttock with fat layer exposed Atherosclerotic heart disease of native coronary artery without angina pectoris Chronic combined systolic (congestive) and diastolic (congestive) heart failure Essential (primary) hypertension Plan Follow-up Appointments: Return Appointment in 1 week. Home Health: [facility] for wound care. May utilize formulary  equivalent dressing for wound treatment orders unless otherwise specified. Home Health Nurse may visit PRN to address patientoos wound care needs. Alvis Lemmings fax [phone] Bathing/ Shower/ Hygiene: May shower; gently cleanse wound with antibacterial soap, rinse and pat dry prior to dressing wounds [name], [name] ([id number]) [id number] Page 8 of 9 Anesthetic (Use 'Patient Medications' Section for Anesthetic Order Entry): Lidocaine applied to wound bed Edema Control - Lymphedema / Segmental Compressive Device / Other: Elevate, Exercise Daily and Avoid Standing for Long Periods of Time. Elevate legs to the level of the heart and pump ankles as often as possible Elevate leg(s) parallel to the floor when sitting. WOUND #4: - Calcaneus Wound Laterality: Right Cleanser: Soap and Water 1 x Per Day/30 Days Discharge Instructions: Gently cleanse wound with antibacterial soap, rinse and pat dry prior to dressing wounds Prim Dressing: IODOFLEX 0.9% Cadexomer Iodine Pad ([place]) 1 x Per Day/30 Days ary Discharge Instructions: Apply Iodoflex to wound bed only as directed. Secondary Dressing: (BORDER) Zetuvit Plus SILICONE BORDER Dressing 4x4 (in/in) (Home Health) (Generic) 1 x Per Day/30 Days Discharge Instructions: Please do not put silicone bordered dressings under wraps. Use non-bordered dressing only. WOUND #5: - Calcaneus Wound Laterality: Left Cleanser: Soap and Water 1 x Per Day/30 Days Discharge Instructions: Gently cleanse wound with antibacterial soap, rinse and pat dry prior to dressing wounds Prim Dressing: IODOFLEX 0.9% Cadexomer Iodine Pad ([place]) 1 x Per Day/30 Days ary Discharge Instructions: Apply Iodoflex to wound bed only as directed. Secondary Dressing: (BORDER) Zetuvit Plus SILICONE BORDER Dressing 4x4 (in/in) (Home Health) (Generic) 1 x Per Day/30 Days Discharge Instructions: Please do not put silicone bordered dressings under wraps.  Use non-bordered dressing only. 1. I would recommend  currently that we have the patient going continue to monitor for any signs of infection or worsening will get a stick with the Iodosorb at this point. 2. I am also can recommend that we have the patient continue with bordered foam dressing to cover. 3. I am also going to suggest patient should continue to monitor for any signs of infection or worsening. Obviously if anything changes he knows contact the office and let me know. We will see patient back for reevaluation in 1 week here in the clinic. If anything worsens or changes patient will contact our office for additional recommendations. Electronic Signature(s) Signed: 12/10/2022 2:34:24 PM By: [name] Entered By: Worthy Keeler on 12/10/2022 14:34:24 -------------------------------------------------------------------------------- SuperBill Details Patient Name: Date of Service: [name], [name] 12/10/2022 Medical Record Number: [id number] Patient Account Number: [id number] Date of Birth/Sex: Treating RN: May 01, 1946 (77 y.o. [name] Primary Care Provider: [place] [name] Other Clinician: Referring Provider: Treating Provider/Extender: [name] in Treatment: 2 Diagnosis Coding ICD-10 Codes Code Description [phone] Type 1 diabetes mellitus with foot ulcer I73.89 Other specified peripheral vascular diseases L89.610 Pressure ulcer of right heel, unstageable L89.620 Pressure ulcer of left heel, unstageable L98.412 Non-pressure chronic ulcer of buttock with fat layer exposed I25.10 Atherosclerotic heart disease of native coronary artery without angina pectoris I50.42 Chronic combined systolic (congestive) and diastolic (congestive) heart failure I10 Essential (primary) hypertension Physician Procedures : CPT4 Code Description Modifier [id number] - WC PHYS LEVEL 3 - EST PT ICD-10 Diagnosis Description [name], [name]  ([id number]) [id number] Page 9 of 9 E10.621 Type 1 diabetes mellitus with foot ulcer I73.89 Other  specified peripheral vascular diseases L89.610 Pressure ulcer of right heel, unstageable L89.620 Pressure ulcer of left heel, unstageable Quantity: 1 Electronic Signature(s) Signed: 12/10/2022 4:45:41 PM By: [name] Loud MSN RN CNS WTA Signed: 12/10/2022 4:45:54 PM By: [name] Previous Signature: 12/10/2022 4:11:00 PM Version By: [name] Loud MSN RN CNS WTA Previous Signature: 12/10/2022 4:08:16 PM Version By: [name] Loud MSN RN CNS WTA Previous Signature: 12/10/2022 2:34:41 PM Version By: [name] Entered By: [name] on 12/10/2022 16:45:41

## 2022-12-10 NOTE — Progress Notes (Addendum)
JORDANNY, WADDINGTON ([id number]) [id number] Page 1 of 11 Visit Report for 12/10/2022 Arrival Information Details Patient Name: Date of Service: PO Junius Creamer 12/10/2022 1:15 PM Medical Record Number: [id number] Patient Account Number: [id number] Date of Birth/Sex: Treating RN: 10/10/1946 (77 y.o. [name] Amiel Sharrow: [name] Other Clinician: Referring Sundra Haddix: Treating Carrington Olazabal/Extender: [name] in Treatment: 2 Visit Information History Since Last Visit Added or deleted any medications: No Patient Arrived: Wheel Chair Any new allergies or adverse reactions: No Arrival Time: 13:33 Had a fall or experienced change in No Accompanied By: partner activities of daily living that may affect Transfer Assistance: None risk of falls: Patient Identification Verified: Yes Hospitalized since last visit: No Secondary Verification Process Completed: Yes Has Dressing in Place as Prescribed: Yes Patient Requires Transmission-Based Precautions: No Pain Present Now: No Patient Has Alerts: Yes Patient Alerts: Type I Diabetic eliquis Electronic Signature(s) Signed: 12/10/2022 4:15:30 PM By: [name] Entered By: [name] on 12/10/2022 14:24:44 -------------------------------------------------------------------------------- Clinic Level of Care Assessment Details Patient Name: Date of Service: [name], [name] 12/10/2022 1:15 PM Medical Record Number: [id number] Patient Account Number: [id number] Date of Birth/Sex: Treating RN: 03-01-1946 (77 y.o. [name] Cristian Davitt: [name] Other Clinician: Referring Matthewjames Petrasek: Treating Kaniel Kiang/Extender: [name] in Treatment: 2 Clinic Level of Care Assessment Items TOOL 4 Quantity Score '[]'$  - 0 Use when only an EandM is performed on FOLLOW-UP visit ASSESSMENTS - Nursing Assessment /  Reassessment '[]'$  - 0 Reassessment of Co-morbidities (includes updates in patient status) '[]'$  - 0 Reassessment of Adherence to Treatment Plan ASSESSMENTS - Wound and Skin A ssessment / Reassessment '[]'$  - 0 Simple Wound Assessment / Reassessment - one wound Tracey, Xavion H ([id number]) [id number] Page 2 of 11 '[]'$  - 0 Complex Wound Assessment / Reassessment - multiple wounds '[]'$  - 0 Dermatologic / Skin Assessment (not related to wound area) ASSESSMENTS - Focused Assessment '[]'$  - 0 Circumferential Edema Measurements - multi extremities '[]'$  - 0 Nutritional Assessment / Counseling / Intervention '[]'$  - 0 Lower Extremity Assessment (monofilament, tuning fork, pulses) '[]'$  - 0 Peripheral Arterial Disease Assessment (using hand held doppler) ASSESSMENTS - Ostomy and/or Continence Assessment and Care '[]'$  - 0 Incontinence Assessment and Management '[]'$  - 0 Ostomy Care Assessment and Management (repouching, etc.) PROCESS - Coordination of Care '[]'$  - 0 Simple Patient / Family Education for ongoing care '[]'$  - 0 Complex (extensive) Patient / Family Education for ongoing care '[]'$  - 0 Staff obtains Programmer, systems, Records, T Results / Process Orders est '[]'$  - 0 Staff telephones HHA, Nursing Homes / Clarify orders / etc '[]'$  - 0 Routine Transfer to another Facility (non-emergent condition) '[]'$  - 0 Routine Hospital Admission (non-emergent condition) '[]'$  - 0 New Admissions / Biomedical engineer / Ordering NPWT Apligraf, etc. , '[]'$  - 0 Emergency Hospital Admission (emergent condition) '[]'$  - 0 Simple Discharge Coordination '[]'$  - 0 Complex (extensive) Discharge Coordination PROCESS - Special Needs '[]'$  - 0 Pediatric / Minor Patient Management '[]'$  - 0 Isolation Patient Management '[]'$  - 0 Hearing / Language / Visual special needs '[]'$  - 0 Assessment of Community assistance (transportation, D/C planning, etc.) '[]'$  - 0 Additional assistance / Altered mentation '[]'$  - 0 Support  Surface(s) Assessment (bed, cushion, seat, etc.) INTERVENTIONS - Wound Cleansing / Measurement '[]'$  - 0 Simple Wound Cleansing - one wound '[]'$  - 0 Complex Wound Cleansing - multiple wounds '[]'$  - 0 Wound Imaging (photographs -  any number of wounds) '[]'$  - 0 Wound Tracing (instead of photographs) '[]'$  - 0 Simple Wound Measurement - one wound '[]'$  - 0 Complex Wound Measurement - multiple wounds INTERVENTIONS - Wound Dressings '[]'$  - 0 Small Wound Dressing one or multiple wounds '[]'$  - 0 Medium Wound Dressing one or multiple wounds '[]'$  - 0 Large Wound Dressing one or multiple wounds '[]'$  - 0 Application of Medications - topical '[]'$  - 0 Application of Medications - injection INTERVENTIONS - Miscellaneous '[]'$  - 0 External ear exam '[]'$  - 0 Specimen Collection (cultures, biopsies, blood, body fluids, etc.) '[]'$  - 0 Specimen(s) / Culture(s) sent or taken to Lab for analysis RAJAH, TAGLIAFERRO ([id number]) [id number] Page 3 of 11 '[]'$  - 0 Patient Transfer (multiple staff / Civil Service fast streamer / Similar devices) '[]'$  - 0 Simple Staple / Suture removal (25 or less) '[]'$  - 0 Complex Staple / Suture removal (26 or more) '[]'$  - 0 Hypo / Hyperglycemic Management (close monitor of Blood Glucose) '[]'$  - 0 Ankle / Brachial Index (ABI) - do not check if billed separately '[]'$  - 0 Vital Signs Has the patient been seen at the hospital within the last three years: Yes Total Score: 0 Level Of Care: ____ Electronic Signature(s) Signed: 12/10/2022 4:15:30 PM By: [name] Entered By: [name] on 12/10/2022 16:10:53 -------------------------------------------------------------------------------- Encounter Discharge Information Details Patient Name: Date of Service: [name], [name] 12/10/2022 1:15 PM Medical Record Number: [id number] Patient Account Number: [id number] Date of Birth/Sex: Treating RN: 1945/12/05 (77 y.o. [name] Raynisha Avilla: [name]  Other Clinician: Referring Zakaree Mcclenahan: Treating Charyl Minervini/Extender: [name] in Treatment: 2 Encounter Discharge Information Items Post Procedure Vitals Discharge Condition: Stable Temperature (F): 98.2 Ambulatory Status: Wheelchair Pulse (bpm): 92 Discharge Destination: Home Respiratory Rate (breaths/min): 16 Transportation: Private Auto Blood Pressure (mmHg): 151/75 Accompanied By: partner Schedule Follow-up Appointment: Yes Clinical Summary of Care: Electronic Signature(s) Signed: 12/10/2022 4:12:06 PM By: [name] Entered By: [name] on 12/10/2022 16:12:06 -------------------------------------------------------------------------------- Lower Extremity Assessment Details Patient Name: Date of Service: [name], [name] 12/10/2022 1:15 PM Medical Record Number: [id number] Patient Account Number: [id number] Date of Birth/Sex: Treating RN: 1946-02-05 (77 y.o. [name] Raquon Milledge: [name] Other Clinician: WORTHY, BOSCHERT ([id number]) [id number] Page 4 of 11 Referring Daija Routson: Treating Taesean Reth/Extender: [name] in Treatment: 2 Edema Assessment Assessed: [Left: Yes] [Right: Yes] [Left: Edema] [Right: :] Calf Left: Right: Point of Measurement: 34 cm From Medial Instep 42.5 cm 39.5 cm Ankle Left: Right: Point of Measurement: 10 cm From Medial Instep 29 cm 26.5 cm Vascular Assessment Pulses: Dorsalis Pedis Palpable: [Left:Yes] [Right:Yes] Electronic Signature(s) Signed: 12/10/2022 4:10:22 PM By: [name] Entered By: [name] on 12/10/2022 16:10:22 -------------------------------------------------------------------------------- Multi Wound Chart Details Patient Name: Date of Service: [name], [name] 12/10/2022 1:15 PM Medical Record Number: [id number] Patient Account Number: [id number] Date of Birth/Sex:  Treating RN: 08-27-1946 (77 y.o. [name] Alyssha Housh: [name] Other Clinician: Referring Maclain Cohron: Treating Kang Ishida/Extender: [name] in Treatment: 2 Vital Signs Height(in): 50 Pulse(bpm): 3 Weight(lbs): 190 Blood Pressure(mmHg): 151/75 Body Mass Index(BMI): 28.1 Temperature(F): 98.2 Respiratory Rate(breaths/min): 16 [4:Photos:] [N/A:N/A] Right Calcaneus Left Calcaneus N/A Wound Location: Pressure Injury Pressure Injury N/A Wounding Event: Pressure Ulcer Pressure Ulcer N/A Primary Etiology: Coronary Artery Disease, Coronary Artery Disease, N/A Comorbid History: Hypertension, Type I Diabetes, Hypertension, Type  I Diabetes, Osteoarthritis, Received Osteoarthritis, Received MAYCEN, DEGREGORY ([id number]) [id number] Page 5 of 11 Chemotherapy Chemotherapy 10/20/2022 10/20/2022 N/A Date Acquired: 2 2 N/A Weeks of Treatment: Open Open N/A Wound Status: No No N/A Wound Recurrence: 0.5x1.5x0.1 1.7x2.5x0.3 N/A Measurements L x W x D (cm) 0.589 3.338 N/A A (cm) : rea 0.059 1.001 N/A Volume (cm) : 87.50% -608.70% N/A % Reduction in A rea: 87.50% -2029.80% N/A % Reduction in Volume: Unstageable/Unclassified Unstageable/Unclassified N/A Classification: Medium Medium N/A Exudate A mount: Serosanguineous Serosanguineous N/A Exudate Type: red, brown red, brown N/A Exudate Color: N/A Chemical/Enzymatic/Mechanical N/A Debridement: N/A 13:50 N/A Pre-procedure Verification/Time Out Taken: N/A Lidocaine 4% T opical Solution N/A Pain Control: N/A Other(Saline gauze) N/A Instrument: N/A Minimum N/A Bleeding: N/A Pressure N/A Hemostasis A chieved: N/A Procedure was tolerated well N/A Debridement Treatment Response: N/A 1.7x2.5x0.3 N/A Post Debridement Measurements L x W x D (cm) N/A 1.001 N/A Post Debridement Volume: (cm) N/A Unstageable/Unclassified N/A Post Debridement  Stage: N/A Debridement N/A Procedures Performed: Treatment Notes Wound #4 (Calcaneus) Wound Laterality: Right Cleanser Soap and Water Discharge Instruction: Gently cleanse wound with antibacterial soap, rinse and pat dry prior to dressing wounds Peri-Wound Care Topical Primary Dressing IODOFLEX 0.9% Cadexomer Iodine Pad Discharge Instruction: Apply Iodoflex to wound bed only as directed. Secondary Dressing (BORDER) Zetuvit Plus SILICONE BORDER Dressing 4x4 (in/in) Discharge Instruction: Please do not put silicone bordered dressings under wraps. Use non-bordered dressing only. Secured With Compression Wrap Compression Stockings Add-Ons Wound #5 (Calcaneus) Wound Laterality: Left Cleanser Soap and Water Discharge Instruction: Gently cleanse wound with antibacterial soap, rinse and pat dry prior to dressing wounds Peri-Wound Care Topical Primary Dressing IODOFLEX 0.9% Cadexomer Iodine Pad Discharge Instruction: Apply Iodoflex to wound bed only as directed. Secondary Dressing (BORDER) Zetuvit Plus SILICONE BORDER Dressing 4x4 (in/in) Discharge Instruction: Please do not put silicone bordered dressings under wraps. Use non-bordered dressing only. Secured With Compression Wrap Compression Stockings Add-Ons ZACHARI, ALBERTA ([id number]) [id number] Page 6 of 11 Electronic Signature(s) Signed: 12/10/2022 4:10:31 PM By: [name] Entered By: [name] on 12/10/2022 16:10:31 -------------------------------------------------------------------------------- Multi-Disciplinary Care Plan Details Patient Name: Date of Service: [name], [name] 12/10/2022 1:15 PM Medical Record Number: [id number] Patient Account Number: [id number] Date of Birth/Sex: Treating RN: Jun 08, 1946 (77 y.o. [name] Torri Michalski: [name] Other Clinician: Referring Mykael Batz: Treating Lanyiah Brix/Extender: [name] in Treatment: 2 Active Inactive Necrotic Tissue Nursing Diagnoses: Knowledge deficit related to management of necrotic/devitalized tissue Goals: Patient/caregiver will verbalize understanding of reason and process for debridement of necrotic tissue Date Initiated: 11/26/2022 Target Resolution Date: 12/03/2022 Goal Status: Active Interventions: Assess patient pain level pre-, during and post procedure and prior to discharge Notes: Pressure Nursing Diagnoses: Potential for impaired tissue integrity related to pressure, friction, moisture, and shear Goals: Patient will remain free of pressure ulcers Date Initiated: 11/26/2022 Target Resolution Date: 12/24/2022 Goal Status: Active Interventions: Assess: immobility, friction, shearing, incontinence upon admission and as needed Assess offloading mechanisms upon admission and as needed Assess potential for pressure ulcer upon admission and as needed Notes: Wound/Skin Impairment Nursing Diagnoses: Knowledge deficit related to ulceration/compromised skin integrity Goals: Patient/caregiver will verbalize understanding of skin care regimen Date Initiated: 11/26/2022 Target Resolution Date: 12/03/2022 Goal Status: Active Ulcer/skin breakdown will have a volume reduction of 30% by week 4 Date Initiated: 11/26/2022 Target Resolution Date: 12/24/2022 Goal Status: Active KERRICK, MILER ([id number]) [id number] Page 7 of 11 Ulcer/skin breakdown will have a volume  reduction of 50% by week 8 Date Initiated: 11/26/2022 Target Resolution Date: 01/21/2023 Goal Status: Active Ulcer/skin breakdown will have a volume reduction of 80% by week 12 Date Initiated: 11/26/2022 Target Resolution Date: 02/18/2023 Goal Status: Active Ulcer/skin breakdown will heal within 14 weeks Date Initiated: 11/26/2022 Target Resolution Date: 03/18/2023 Goal Status: Active Interventions: Assess patient/caregiver ability to obtain  necessary supplies Assess patient/caregiver ability to perform ulcer/skin care regimen upon admission and as needed Assess ulceration(s) every visit Notes: Electronic Signature(s) Signed: 12/10/2022 4:11:10 PM By: [name] Entered By: [name] on 12/10/2022 16:11:10 -------------------------------------------------------------------------------- Pain Assessment Details Patient Name: Date of Service: [name], [name] 12/10/2022 1:15 PM Medical Record Number: [id number] Patient Account Number: [id number] Date of Birth/Sex: Treating RN: [birth year] (77 y.o. [name] Angelys Yetman: [name] Other Clinician: Referring Joley Utecht: Treating Kenzey Birkland/Extender: [name] in Treatment: 2 Active Problems Location of Pain Severity and Description of Pain Patient Has Paino No Site Locations Pain Management and Medication Current Pain Management: Electronic Signature(s) Signed: 12/10/2022 4:15:30 PM By: [name] Entered By: [name] on 12/10/2022 14:08:41 [place], [name] ([id number]) [id number] Page 8 of 11 -------------------------------------------------------------------------------- Patient/Caregiver Education Details Patient Name: Date of Service: [name], [name] 1/30/2024andnbsp1:15 PM Medical Record Number: [id number] Patient Account Number: [id number] Date of Birth/Gender: Treating RN: 02-03-1946 (77 y.o. [name] Physician: [name] Other Clinician: Referring Physician: Treating Physician/Extender: [name] in Treatment: 2 Education Assessment Education Provided To: Patient Education Topics Provided Wound/Skin Impairment: Handouts: Caring for Your Ulcer Methods: Explain/Verbal Responses: State content correctly Electronic Signature(s) Signed: 12/10/2022 4:15:30 PM By: [name] MSN RN CNS  WTA Entered By: [name] on 12/10/2022 16:11:05 -------------------------------------------------------------------------------- Wound Assessment Details Patient Name: Date of Service: [name], [name] 12/10/2022 1:15 PM Medical Record Number: [id number] Patient Account Number: [id number] Date of Birth/Sex: Treating RN: September 24, 1946 (77 y.o. [name] Noor Vidales: [name] Other Clinician: Referring Lisamarie Coke: Treating Javontay Vandam/Extender: [name] in Treatment: 2 Wound Status Wound Number: 4 Primary Pressure Ulcer Etiology: Wound Location: Right Calcaneus Wound Open Wounding Event: Pressure Injury Status: Date Acquired: 10/20/2022 Comorbid Coronary Artery Disease, Hypertension, Type I Diabetes, Weeks Of Treatment: 2 History: Osteoarthritis, Received Chemotherapy Clustered Wound: No Photos BENTLIE, CATANZARO ([id number]) [id number] Page 9 of 11 Wound Measurements Length: (cm) 0.5 Width: (cm) 1.5 Depth: (cm) 0.1 Area: (cm) 0.589 Volume: (cm) 0.059 % Reduction in Area: 87.5% % Reduction in Volume: 87.5% Wound Description Classification: Unstageable/Unclassified Exudate Amount: Medium Exudate Type: Serosanguineous Exudate Color: red, brown Treatment Notes Wound #4 (Calcaneus) Wound Laterality: Right Cleanser Soap and Water Discharge Instruction: Gently cleanse wound with antibacterial soap, rinse and pat dry prior to dressing wounds Peri-Wound Care Topical Primary Dressing IODOFLEX 0.9% Cadexomer Iodine Pad Discharge Instruction: Apply Iodoflex to wound bed only as directed. Secondary Dressing (BORDER) Zetuvit Plus SILICONE BORDER Dressing 4x4 (in/in) Discharge Instruction: Please do not put silicone bordered dressings under wraps. Use non-bordered dressing only. Secured With Compression Wrap Compression Stockings Environmental education officer) Signed: 12/10/2022 4:15:30 PM By:  [name] Entered By: [name] on 12/10/2022 13:49:50 -------------------------------------------------------------------------------- Wound Assessment Details Patient Name: Date of Service: [name], [name] 12/10/2022 1:15 PM Medical Record Number: [id number] Patient Account Number: [id number] MARCELO, ICKES ([id number]) [id number] Page 10 of 11 Date of Birth/Sex: Treating RN: 04-14-1946 (77 y.o. [name]  Primary Care Domingos Riggi: [name] Other Clinician: Referring Gita Dilger: Treating Kamdon Reisig/Extender: [name] in Treatment: 2 Wound Status Wound Number: 5 Primary Pressure Ulcer Etiology: Wound Location: Left Calcaneus Wound Open Wounding Event: Pressure Injury Status: Date Acquired: 10/20/2022 Comorbid Coronary Artery Disease, Hypertension, Type I Diabetes, Weeks Of Treatment: 2 History: Osteoarthritis, Received Chemotherapy Clustered Wound: No Photos Wound Measurements Length: (cm) 1.7 Width: (cm) 2.5 Depth: (cm) 0.3 Area: (cm) 3.338 Volume: (cm) 1.001 % Reduction in Area: -608.7% % Reduction in Volume: -2029.8% Wound Description Classification: Unstageable/Unclassified Exudate Amount: Medium Exudate Type: Serosanguineous Exudate Color: red, brown Treatment Notes Wound #5 (Calcaneus) Wound Laterality: Left Cleanser Soap and Water Discharge Instruction: Gently cleanse wound with antibacterial soap, rinse and pat dry prior to dressing wounds Peri-Wound Care Topical Primary Dressing IODOFLEX 0.9% Cadexomer Iodine Pad Discharge Instruction: Apply Iodoflex to wound bed only as directed. Secondary Dressing (BORDER) Zetuvit Plus SILICONE BORDER Dressing 4x4 (in/in) Discharge Instruction: Please do not put silicone bordered dressings under wraps. Use non-bordered dressing only. Secured With Compression Wrap Compression Stockings Environmental education officer) Signed:  12/10/2022 4:09:02 PM By: [name] Entered By: [name] on 12/10/2022 [place], [name] ([id number]) [id number] Page 11 of 11 -------------------------------------------------------------------------------- Vitals Details Patient Name: Date of Service: [name], [name] 12/10/2022 1:15 PM Medical Record Number: [id number] Patient Account Number: [id number] Date of Birth/Sex: Treating RN: 1946-10-27 (77 y.o. [name] Cathi Hazan: [name] Other Clinician: Referring Shalia Bartko: Treating Denna Fryberger/Extender: [name] in Treatment: 2 Vital Signs Time Taken: 13:42 Temperature (F): 98.2 Height (in): 69 Pulse (bpm): 92 Weight (lbs): 190 Respiratory Rate (breaths/min): 16 Body Mass Index (BMI): 28.1 Blood Pressure (mmHg): 151/75 Reference Range: 80 - 120 mg / dl Electronic Signature(s) Signed: 12/10/2022 4:15:30 PM By: [name] Entered By: [name] on 12/10/2022 14:08:37

## 2022-12-11 ENCOUNTER — Other Ambulatory Visit: Payer: Self-pay | Admitting: Cardiovascular Disease

## 2022-12-11 DIAGNOSIS — L89610 Pressure ulcer of right heel, unstageable: Secondary | ICD-10-CM | POA: Diagnosis not present

## 2022-12-11 DIAGNOSIS — L98412 Non-pressure chronic ulcer of buttock with fat layer exposed: Secondary | ICD-10-CM | POA: Diagnosis not present

## 2022-12-11 DIAGNOSIS — L89620 Pressure ulcer of left heel, unstageable: Secondary | ICD-10-CM | POA: Diagnosis not present

## 2022-12-11 DIAGNOSIS — E10621 Type 1 diabetes mellitus with foot ulcer: Secondary | ICD-10-CM | POA: Diagnosis not present

## 2022-12-12 ENCOUNTER — Other Ambulatory Visit: Payer: Self-pay

## 2022-12-12 DIAGNOSIS — J1089 Influenza due to other identified influenza virus with other manifestations: Secondary | ICD-10-CM | POA: Diagnosis not present

## 2022-12-12 DIAGNOSIS — L89610 Pressure ulcer of right heel, unstageable: Secondary | ICD-10-CM | POA: Diagnosis not present

## 2022-12-12 DIAGNOSIS — M00062 Staphylococcal arthritis, left knee: Secondary | ICD-10-CM | POA: Diagnosis not present

## 2022-12-12 DIAGNOSIS — E782 Mixed hyperlipidemia: Secondary | ICD-10-CM

## 2022-12-12 DIAGNOSIS — B9562 Methicillin resistant Staphylococcus aureus infection as the cause of diseases classified elsewhere: Secondary | ICD-10-CM | POA: Diagnosis not present

## 2022-12-12 DIAGNOSIS — C679 Malignant neoplasm of bladder, unspecified: Secondary | ICD-10-CM | POA: Diagnosis not present

## 2022-12-12 DIAGNOSIS — L89620 Pressure ulcer of left heel, unstageable: Secondary | ICD-10-CM | POA: Diagnosis not present

## 2022-12-12 DIAGNOSIS — T8143XA Infection following a procedure, organ and space surgical site, initial encounter: Secondary | ICD-10-CM | POA: Diagnosis not present

## 2022-12-12 DIAGNOSIS — E1065 Type 1 diabetes mellitus with hyperglycemia: Secondary | ICD-10-CM | POA: Diagnosis not present

## 2022-12-12 DIAGNOSIS — R7881 Bacteremia: Secondary | ICD-10-CM | POA: Diagnosis not present

## 2022-12-12 DIAGNOSIS — I10 Essential (primary) hypertension: Secondary | ICD-10-CM

## 2022-12-12 MED ORDER — ENTRESTO 24-26 MG PO TABS: 1.0000 | ORAL_TABLET | Freq: Two times a day (BID) | ORAL | 3 refills | Status: DC

## 2022-12-12 NOTE — Telephone Encounter (Signed)
[name], MD  Sent: Wed December 11, 2022 12:34 PM  To: [facility] T[facility]         Message  Eliquis will be needed at current dose 5 twice a day until end of February after that could stop Eliquis    Blood pressures running a little bit high now off the midodrine  We can use cardiac medications given his history of cardiomyopathy, ejection fraction was depressed  Would recommend we start Entresto 24/26 mg twice daily  Would recommend we continue to monitor heart rate and blood pressure for further medication titration    After 10-day period of Lasix daily would decrease Lasix down to every other day with potassium  Will look for BMP after completing Lasix for 10 days  Weight looks like it is trending down, 5 pounds, need to make sure we are not getting dehydrated    Thx  TGollan

## 2022-12-12 NOTE — Telephone Encounter (Signed)
Spoke with Kathrene Bongo in regards to the treatment plan for the patient [name] Informed Saralyn Pilar that Dr. Rockey Situ wants to continue Eliquis at current dose 5 mg twice a day until end of February after that could stop Eliquis.  Start sacubitril-valsartan (ENTRESTO) 24-26 MG, Take 1 tablet by mouth 2 (two) times daily   Continue to monitor heart rate and blood pressure for further medication titration   After 10-day period of furosemide (LASIX) 20 MG tablet daily would decrease furosemide (LASIX) 20 MG tablet down to every other day with potassium chloride SA (KLOR-CON M) 20 MEQ tablet  BMET after completing Lasix for 10 days, Monday 12/16/22  Weight looks like it is trending down, 5 pounds, need to make sure we are not getting dehydrated

## 2022-12-16 ENCOUNTER — Other Ambulatory Visit
Admission: RE | Admit: 2022-12-16 | Discharge: 2022-12-16 | Disposition: A | Discharge status: Home / Self Care | Payer: Medicare HMO | Source: Ambulatory Visit | Attending: Cardiovascular Disease | Admitting: Cardiovascular Disease

## 2022-12-16 DIAGNOSIS — I10 Essential (primary) hypertension: Secondary | ICD-10-CM | POA: Diagnosis not present

## 2022-12-16 DIAGNOSIS — E782 Mixed hyperlipidemia: Secondary | ICD-10-CM | POA: Insufficient documentation

## 2022-12-16 LAB — BASIC METABOLIC PANEL
Anion gap: 12 (ref 5–15)
BUN: 47 mg/dL — ABNORMAL HIGH (ref 8–23)
CO2: 24 mmol/L (ref 22–32)
Calcium: 8.6 mg/dL — ABNORMAL LOW (ref 8.9–10.3)
Chloride: 103 mmol/L (ref 98–111)
Creatinine, Ser: 1.13 mg/dL (ref 0.61–1.24)
GFR, Estimated: >60 mL/min (ref >60)
Glucose, Bld: 277 mg/dL — ABNORMAL HIGH (ref 70–99)
Potassium: 3.9 mmol/L (ref 3.5–5.1)
Sodium: 139 mmol/L (ref 135–145)

## 2022-12-17 ENCOUNTER — Other Ambulatory Visit (INDEPENDENT_AMBULATORY_CARE_PROVIDER_SITE_OTHER): Payer: Self-pay | Admitting: Physician Assistant

## 2022-12-17 DIAGNOSIS — E10621 Type 1 diabetes mellitus with foot ulcer: Secondary | ICD-10-CM

## 2022-12-18 ENCOUNTER — Telehealth: Payer: Self-pay | Admitting: Cardiovascular Disease

## 2022-12-18 ENCOUNTER — Ambulatory Visit (INDEPENDENT_AMBULATORY_CARE_PROVIDER_SITE_OTHER): Payer: Medicare HMO

## 2022-12-18 DIAGNOSIS — E10621 Type 1 diabetes mellitus with foot ulcer: Secondary | ICD-10-CM

## 2022-12-18 NOTE — Telephone Encounter (Signed)
Pt and Saralyn Pilar called to seek clarification regarding recent medication changes. Pt and and Saralyn Pilar made aware of the following instructions recommended by Dr. Rockey Situ on 1/31. Saralyn Pilar verbalized understanding and reports they have completed lasix 10 mg daily x 10 days and repeated lab work yesterday.   Eliquis will be needed at current dose 5 twice a day until end of February after that could stop Eliquis   Blood pressures running a little bit high now off the midodrine  We can use cardiac medications given his history of cardiomyopathy, ejection fraction was depressed  Would recommend we start Entresto 24/26 mg twice daily  Would recommend we continue to monitor heart rate and blood pressure for further medication titration   After 10-day period of Lasix daily would decrease Lasix down to every other day with potassium  Will look for BMP after completing Lasix for 10 days  Weight looks like it is trending down, 5 pounds, need to make sure we are not getting dehydrated   Thx  TGollan

## 2022-12-18 NOTE — Telephone Encounter (Signed)
Patient called and wanted to talk with Dr. Rockey Situ or nurse in regards to patient's medication. Please call back to discuss

## 2022-12-19 ENCOUNTER — Encounter: Payer: Medicare HMO | Attending: Physician Assistant | Admitting: Physician Assistant

## 2022-12-19 DIAGNOSIS — E10622 Type 1 diabetes mellitus with other skin ulcer: Secondary | ICD-10-CM | POA: Diagnosis not present

## 2022-12-19 DIAGNOSIS — L98412 Non-pressure chronic ulcer of buttock with fat layer exposed: Secondary | ICD-10-CM | POA: Diagnosis not present

## 2022-12-19 DIAGNOSIS — L89620 Pressure ulcer of left heel, unstageable: Secondary | ICD-10-CM | POA: Diagnosis not present

## 2022-12-19 DIAGNOSIS — E1051 Type 1 diabetes mellitus with diabetic peripheral angiopathy without gangrene: Secondary | ICD-10-CM | POA: Insufficient documentation

## 2022-12-19 DIAGNOSIS — I2510 Atherosclerotic heart disease of native coronary artery without angina pectoris: Secondary | ICD-10-CM | POA: Diagnosis not present

## 2022-12-19 DIAGNOSIS — L89613 Pressure ulcer of right heel, stage 3: Secondary | ICD-10-CM | POA: Insufficient documentation

## 2022-12-19 DIAGNOSIS — I110 Hypertensive heart disease with heart failure: Secondary | ICD-10-CM | POA: Insufficient documentation

## 2022-12-19 DIAGNOSIS — L89623 Pressure ulcer of left heel, stage 3: Secondary | ICD-10-CM | POA: Diagnosis not present

## 2022-12-19 DIAGNOSIS — L89610 Pressure ulcer of right heel, unstageable: Secondary | ICD-10-CM | POA: Diagnosis not present

## 2022-12-19 DIAGNOSIS — I5042 Chronic combined systolic (congestive) and diastolic (congestive) heart failure: Secondary | ICD-10-CM | POA: Diagnosis not present

## 2022-12-19 NOTE — Progress Notes (Addendum)
[name], [name] ([id number]) [id number] Page 1 of 10 Visit Report for 12/19/2022 Arrival Information Details Patient Name: Date of Service: [name], [name] 12/19/2022 2:15 PM Medical Record Number: [id number] Patient Account Number: [id number] Date of Birth/Sex: Treating RN: 10-19-1946 (77 y.o. [name] Primary Care Tremar Wickens: [name] Other Clinician: Referring Keeghan Mcintire: Treating Brittay Mogle/Extender: [name] in Treatment: 3 Visit Information History Since Last Visit Added or deleted any medications: No Patient Arrived: Wheel Chair Had a fall or experienced change in No Arrival Time: 14:32 activities of daily living that may affect Accompanied By: partner risk of falls: Transfer Assistance: None Hospitalized since last visit: No Patient Identification Verified: Yes Has Dressing in Place as Prescribed: Yes Secondary Verification Process Completed: Yes Pain Present Now: No Patient Requires Transmission-Based Precautions: No Patient Has Alerts: Yes Patient Alerts: Type I Diabetic eliquis ABI/TBI normal see scan Electronic Signature(s) Signed: 12/20/2022 1:16:55 PM By: [name] Previous Signature: 12/19/2022 4:05:54 PM Version By: [name] Entered By: [name] on 12/20/2022 13:16:55 -------------------------------------------------------------------------------- Clinic Level of Care Assessment Details Patient Name: Date of Service: [name], [name] 12/19/2022 2:15 PM Medical Record Number: [id number] Patient Account Number: [id number] Date of Birth/Sex: Treating RN: January 19, 1946 (77 y.o. [name] Primary Care Hasan Douse: [name] Other Clinician: Referring Rondal Vandevelde: Treating Loralei Radcliffe/Extender: [name] in Treatment: 3 Clinic Level of Care Assessment Items TOOL 1 Quantity Score []$  - 0 Use when EandM and Procedure is performed on  INITIAL visit ASSESSMENTS - Nursing Assessment / Reassessment []$  - 0 General Physical Exam (combine w/ comprehensive assessment (listed just below) when performed on new pt. evals) []$  - 0 Comprehensive Assessment (HX, ROS, Risk Assessments, Wounds Hx, etc.) ASSESSMENTS - Wound and Skin Assessment / Reassessment BRAYLAN, KLEVER ([id number]) [phone].pdf Page 2 of 10 []$  - 0 Dermatologic / Skin Assessment (not related to wound area) ASSESSMENTS - Ostomy and/or Continence Assessment and Care []$  - 0 Incontinence Assessment and Management []$  - 0 Ostomy Care Assessment and Management (repouching, etc.) PROCESS - Coordination of Care []$  - 0 Simple Patient / Family Education for ongoing care []$  - 0 Complex (extensive) Patient / Family Education for ongoing care []$  - 0 Staff obtains Programmer, systems, Records, T Results / Process Orders est []$  - 0 Staff telephones HHA, Nursing Homes / Clarify orders / etc []$  - 0 Routine Transfer to another Facility (non-emergent condition) []$  - 0 Routine Hospital Admission (non-emergent condition) []$  - 0 New Admissions / Biomedical engineer / Ordering NPWT Apligraf, etc. , []$  - 0 Emergency Hospital Admission (emergent condition) PROCESS - Special Needs []$  - 0 Pediatric / Minor Patient Management []$  - 0 Isolation Patient Management []$  - 0 Hearing / Language / Visual special needs []$  - 0 Assessment of Community assistance (transportation, D/C planning, etc.) []$  - 0 Additional assistance / Altered mentation []$  - 0 Support Surface(s) Assessment (bed, cushion, seat, etc.) INTERVENTIONS - Miscellaneous []$  - 0 External ear exam []$  - 0 Patient Transfer (multiple staff / Civil Service fast streamer / Similar devices) []$  - 0 Simple Staple / Suture removal (25 or less) []$  - 0 Complex Staple / Suture removal (26 or more) []$  - 0 Hypo/Hyperglycemic Management (do not check if billed separately) []$  - 0 Ankle / Brachial Index (ABI) - do not  check if billed separately Has the patient been seen at the hospital within the last three years: Yes Total Score: 0 Level  Of Care: ____ Electronic Signature(s) Signed: 12/19/2022 4:05:54 PM By: [name] Entered By: [name] on 12/19/2022 15:26:02 -------------------------------------------------------------------------------- Encounter Discharge Information Details Patient Name: Date of Service: [name], [name] 12/19/2022 2:15 PM Medical Record Number: [id number] Patient Account Number: [id number] Date of Birth/Sex: Treating RN: 1946-04-22 (77 y.o. [name] Primary Care Masie Bermingham: [name] Other Clinician: Referring Shekita Boyden: Treating Marycarmen Hagey/Extender: [name] in Treatment: [name], [name] ([id number]) [id number] Page 3 of 10 Encounter Discharge Information Items Post Procedure Vitals Discharge Condition: Stable Temperature (F): 98.2 Ambulatory Status: Wheelchair Pulse (bpm): 92 Discharge Destination: Home Respiratory Rate (breaths/min): 16 Transportation: Private Auto Blood Pressure (mmHg): 151/75 Accompanied By: partner Schedule Follow-up Appointment: No Clinical Summary of Care: Electronic Signature(s) Signed: 12/19/2022 4:05:54 PM By: [name] Entered By: [name] on 12/19/2022 15:27:16 -------------------------------------------------------------------------------- Lower Extremity Assessment Details Patient Name: Date of Service: [name], [name] 12/19/2022 2:15 PM Medical Record Number: [id number] Patient Account Number: [id number] Date of Birth/Sex: Treating RN: [birth year] (77 y.o. [name] Primary Care Kimothy Kishimoto: [name] Other Clinician: Referring Benay Pomeroy: Treating Nafisa Olds/Extender: [name] in Treatment: 3 Edema Assessment Assessed: [Left: No] [Right: No] [Left: Edema] [Right: :] Calf Left:  Right: Point of Measurement: 34 cm From Medial Instep 38 cm 34.5 cm Ankle Left: Right: Point of Measurement: 10 cm From Medial Instep 29.5 cm 26.5 cm Vascular Assessment Pulses: Dorsalis Pedis Palpable: [Left:Yes] [Right:Yes] Electronic Signature(s) Signed: 12/19/2022 4:05:54 PM By: [name] Entered By: [name] on 12/19/2022 14:51:26 [name], [name] ([id number] Page 4 of 10 -------------------------------------------------------------------------------- Multi Wound Chart Details Patient Name: Date of Service: [name], [name] 12/19/2022 2:15 PM Medical Record Number: [id number] Patient Account Number: [id number] Date of Birth/Sex: Treating RN: 1945-12-25 (77 y.o. [name] Primary Care Ellakate Gonsalves: [name] Other Clinician: Referring Kayonna Lawniczak: Treating Shakiyla Kook/Extender: [name] in Treatment: 3 Vital Signs Height(in): 18 Pulse(bpm): 99 Weight(lbs): 190 Blood Pressure(mmHg): 146/76 Body Mass Index(BMI): 28.1 Temperature(F): 98.0 Respiratory Rate(breaths/min): 16 [4:Photos:] [N/A:N/A] Right Calcaneus Left Calcaneus N/A Wound Location: Pressure Injury Pressure Injury N/A Wounding Event: Pressure Ulcer Pressure Ulcer N/A Primary Etiology: Coronary Artery Disease, Coronary Artery Disease, N/A Comorbid History: Hypertension, Type I Diabetes, Hypertension, Type I Diabetes, Osteoarthritis, Received Osteoarthritis, Received Chemotherapy Chemotherapy 10/20/2022 10/20/2022 N/A Date Acquired: 3 3 N/A Weeks of Treatment: Open Open N/A Wound Status: No No N/A Wound Recurrence: 0.7x1.6x0.1 1.8x2.7x0.2 N/A Measurements L x W x D (cm) 0.88 3.817 N/A A (cm) : rea 0.088 0.763 N/A Volume (cm) : 81.30% -710.40% N/A % Reduction in A rea: 81.30% -1523.40% N/A % Reduction in Volume: Unstageable/Unclassified Unstageable/Unclassified N/A Classification: Medium Medium  N/A Exudate A mount: Serosanguineous Serosanguineous N/A Exudate Type: red, brown red, brown N/A Exudate Color: Fascia: No Fascia: No N/A Exposed Structures: Fat Layer (Subcutaneous Tissue): No Fat Layer (Subcutaneous Tissue): No Tendon: No Tendon: No Muscle: No Muscle: No Joint: No Joint: No Bone: No Bone: No Treatment Notes Electronic Signature(s) Signed: 12/19/2022 4:05:54 PM By: [name] Entered By: [name] on 12/19/2022 14:52:59 Multi-Disciplinary Care Plan Details -------------------------------------------------------------------------------- [name] ([id number]) [phone].pdf Page 5 of 10 Patient Name: Date of Service: [name], [name] 12/19/2022 2:15 PM Medical Record Number: [id number] Patient Account Number: [id number] Date of Birth/Sex: Treating RN: 1946-06-05 (77 y.o. [name] Primary Care Keara Pagliarulo: [name] Other Clinician: Referring  Earnest Mcgillis: Treating Prashant Glosser/Extender: [name] in Treatment: 3 Active Inactive Necrotic Tissue Nursing Diagnoses: Knowledge deficit related to management of necrotic/devitalized tissue Goals: Patient/caregiver will verbalize understanding of reason and process for debridement of necrotic tissue Date Initiated: 11/26/2022 Target Resolution Date: 12/03/2022 Goal Status: Active Interventions: Assess patient pain level pre-, during and post procedure and prior to discharge Notes: Pressure Nursing Diagnoses: Potential for impaired tissue integrity related to pressure, friction, moisture, and shear Goals: Patient will remain free of pressure ulcers Date Initiated: 11/26/2022 Target Resolution Date: 12/24/2022 Goal Status: Active Interventions: Assess: immobility, friction, shearing, incontinence upon admission and as needed Assess offloading mechanisms upon admission and as needed Assess potential for pressure ulcer upon  admission and as needed Notes: Wound/Skin Impairment Nursing Diagnoses: Knowledge deficit related to ulceration/compromised skin integrity Goals: Patient/caregiver will verbalize understanding of skin care regimen Date Initiated: 11/26/2022 Target Resolution Date: 12/03/2022 Goal Status: Active Ulcer/skin breakdown will have a volume reduction of 30% by week 4 Date Initiated: 11/26/2022 Target Resolution Date: 12/24/2022 Goal Status: Active Ulcer/skin breakdown will have a volume reduction of 50% by week 8 Date Initiated: 11/26/2022 Target Resolution Date: 01/21/2023 Goal Status: Active Ulcer/skin breakdown will have a volume reduction of 80% by week 12 Date Initiated: 11/26/2022 Target Resolution Date: 02/18/2023 Goal Status: Active Ulcer/skin breakdown will heal within 14 weeks Date Initiated: 11/26/2022 Target Resolution Date: 03/18/2023 Goal Status: Active Interventions: Assess patient/caregiver ability to obtain necessary supplies Assess patient/caregiver ability to perform ulcer/skin care regimen upon admission and as needed Assess ulceration(s) every visit Notes: Electronic Signature(s) Signed: 12/19/2022 4:05:54 PM By: [name] Entered By: [name] on 12/19/2022 15:26:25 [name], [name] ([id number] Page 6 of 10 -------------------------------------------------------------------------------- Pain Assessment Details Patient Name: Date of Service: [name], [name] 12/19/2022 2:15 PM Medical Record Number: [id number] Patient Account Number: [id number] Date of Birth/Sex: Treating RN: 18-Jul-1946 (77 y.o. [name] Primary Care Aleighna Wojtas: [name] Other Clinician: Referring Chanay Nugent: Treating Chyrl Elwell/Extender: [name] in Treatment: 3 Active Problems Location of Pain Severity and Description of Pain Patient Has Paino No Site Locations Pain Management and Medication Current  Pain Management: Electronic Signature(s) Signed: 12/19/2022 4:05:54 PM By: [name] Entered By: [name] on 12/19/2022 14:38:37 -------------------------------------------------------------------------------- Patient/Caregiver Education Details Patient Name: Date of Service: [name], [name] 2/8/2024andnbsp2:15 PM Medical Record Number: [id number] Patient Account Number: [id number] Date of Birth/Gender: Treating RN: 20-Apr-1946 (77 y.o. [name] Primary Care Physician: [name] Other Clinician: Referring Physician: Treating Physician/Extender: [name] in Treatment: [place], [name] ([id number]) [id number] Page 7 of 10 Education Assessment Education Provided To: Patient Education Topics Provided Wound Debridement: Handouts: Wound Debridement Methods: Explain/Verbal Responses: State content correctly Electronic Signature(s) Signed: 12/19/2022 4:05:54 PM By: [name] Entered By: [name] on 12/19/2022 15:26:20 -------------------------------------------------------------------------------- Wound Assessment Details Patient Name: Date of Service: [name], [name] 12/19/2022 2:15 PM Medical Record Number: [id number] Patient Account Number: [id number] Date of Birth/Sex: Treating RN: Sep 13, 1946 (77 y.o. [name] Primary Care Latravious Levitt: [name] Other Clinician: Referring Marquavious Nazar: Treating Ciarra Braddy/Extender: [name] in Treatment: 3 Wound Status Wound Number: 4 Primary Pressure Ulcer Etiology: Wound Location: Right Calcaneus Wound Open Wounding Event: Pressure Injury Status: Date Acquired: 10/20/2022 Comorbid Coronary Artery Disease, Hypertension, Type I Diabetes, Weeks Of Treatment: 3 History: Osteoarthritis, Received Chemotherapy Clustered Wound: No Photos Wound Measurements Length: (cm)  0.7 Width: (cm)  1.6 Depth: (cm) 0.1 Area: (cm) 0.88 Volume: (cm) 0.088 % Reduction in Area: 81.3% % Reduction in Volume: 81.3% Wound Description Classification: Unstageable/Unclassified Exudate Amount: Medium [name], [name] ([id number]) Exudate Type: Serosanguineous Exudate Color: red, brown [phone] Page 8 of 10 Wound Bed Exposed Structure Fascia Exposed: No Fat Layer (Subcutaneous Tissue) Exposed: No Tendon Exposed: No Muscle Exposed: No Joint Exposed: No Bone Exposed: No Treatment Notes Wound #4 (Calcaneus) Wound Laterality: Right Cleanser Soap and Water Discharge Instruction: Gently cleanse wound with antibacterial soap, rinse and pat dry prior to dressing wounds Peri-Wound Care Topical Primary Dressing IODOFLEX 0.9% Cadexomer Iodine Pad Discharge Instruction: Apply Iodoflex to wound bed only as directed. Secondary Dressing (BORDER) Zetuvit Plus SILICONE BORDER Dressing 4x4 (in/in) Discharge Instruction: Please do not put silicone bordered dressings under wraps. Use non-bordered dressing only. Secured With Compression Wrap Compression Stockings Environmental education officer) Signed: 12/19/2022 4:05:54 PM By: [name] Entered By: [name] on 12/19/2022 14:48:55 -------------------------------------------------------------------------------- Wound Assessment Details Patient Name: Date of Service: [name], [name] 12/19/2022 2:15 PM Medical Record Number: [id number] Patient Account Number: [id number] Date of Birth/Sex: Treating RN: 10/15/1946 (77 y.o. [name] Primary Care Salvador Coupe: [name] Other Clinician: Referring Shreyas Piatkowski: Treating Jadarion Halbig/Extender: [name] in Treatment: 3 Wound Status Wound Number: 5 Primary Pressure Ulcer Etiology: Wound Location: Left Calcaneus Wound Open Wounding Event: Pressure Injury Status: Date Acquired: 10/20/2022 Comorbid Coronary Artery  Disease, Hypertension, Type I Diabetes, Weeks Of Treatment: 3 History: Osteoarthritis, Received Chemotherapy Clustered Wound: No Photos [name], [name] ([id number]) [phone].pdf Page 9 of 10 Wound Measurements Length: (cm) 1.8 Width: (cm) 2.7 Depth: (cm) 0.2 Area: (cm) 3.817 Volume: (cm) 0.763 % Reduction in Area: -710.4% % Reduction in Volume: -1523.4% Wound Description Classification: Unstageable/Unclassified Exudate Amount: Medium Exudate Type: Serosanguineous Exudate Color: red, brown Wound Bed Exposed Structure Fascia Exposed: No Fat Layer (Subcutaneous Tissue) Exposed: No Tendon Exposed: No Muscle Exposed: No Joint Exposed: No Bone Exposed: No Treatment Notes Wound #5 (Calcaneus) Wound Laterality: Left Cleanser Soap and Water Discharge Instruction: Gently cleanse wound with antibacterial soap, rinse and pat dry prior to dressing wounds Peri-Wound Care Topical Primary Dressing IODOFLEX 0.9% Cadexomer Iodine Pad Discharge Instruction: Apply Iodoflex to wound bed only as directed. Secondary Dressing (BORDER) Zetuvit Plus SILICONE BORDER Dressing 4x4 (in/in) Discharge Instruction: Please do not put silicone bordered dressings under wraps. Use non-bordered dressing only. Secured With Compression Wrap Compression Stockings Environmental education officer) Signed: 12/19/2022 4:05:54 PM By: [name] Entered By: [name] on 12/19/2022 14:49:21 [place], [name] ([id number].pdf Page 10 of 10 -------------------------------------------------------------------------------- Vitals Details Patient Name: Date of Service: [name], [name] 12/19/2022 2:15 PM Medical Record Number: [id number] Patient Account Number: [id number] Date of Birth/Sex: Treating RN: [birth year] (77 y.o. [name] Primary Care Divit Stipp: [name] Other Clinician: Referring Stratton Villwock: Treating  Olegario Emberson/Extender: [name] in Treatment: 3 Vital Signs Time Taken: 14:38 Temperature (F): 98.0 Height (in): 69 Pulse (bpm): 99 Weight (lbs): 190 Respiratory Rate (breaths/min): 16 Body Mass Index (BMI): 28.1 Blood Pressure (mmHg): 146/76 Reference Range: 80 - 120 mg / dl Electronic Signature(s) Signed: 12/19/2022 4:05:54 PM By: [name] Entered By: [name] on 12/19/2022 14:38:29

## 2022-12-19 NOTE — Progress Notes (Addendum)
HOLLAND, MIHALIK ([id number]) [id number] Page 1 of 10 Visit Report for 12/19/2022 Chief Complaint Document Details Patient Name: Date of Service: [name], [name] 12/19/2022 2:15 PM Medical Record Number: [id number] Patient Account Number: [id number] Date of Birth/Sex: Treating RN: 1946/11/04 (76 y.o. [name] Provider: [name] Other Clinician: Referring Provider: Treating Provider/Extender: [name] in Treatment: 3 Information Obtained from: Patient Chief Complaint Bilateral heel pressure ulcers and sacral pressure ulcer Electronic Signature(s) Signed: 12/19/2022 2:24:48 PM By: [name] Entered By: Worthy Keeler on 12/19/2022 14:24:47 -------------------------------------------------------------------------------- Debridement Details Patient Name: Date of Service: [name], [name] 12/19/2022 2:15 PM Medical Record Number: [id number] Patient Account Number: [id number] Date of Birth/Sex: Treating RN: 05-19-1946 (77 y.o. [name] Provider: [name] Other Clinician: Referring Provider: Treating Provider/Extender: [name] in Treatment: 3 Debridement Performed for Assessment: Wound #5 Left Calcaneus Performed By: Physician [name], [name] Debridement Type: Debridement Level of Consciousness (Pre-procedure): Awake and Alert Pre-procedure Verification/Time Out Yes - 15:25 Taken: Start Time: 15:25 T Area Debrided (L x W): otal 1.8 (cm) x 2.7 (cm) = 4.86 (cm) Tissue and other material debrided: Viable, Non-Viable, Callus, Slough, Subcutaneous, Slough Level: Skin/Subcutaneous Tissue Debridement Description: Excisional Instrument: Curette Bleeding: Minimum Hemostasis Achieved: Pressure Response to Treatment: Procedure was tolerated well Level of Consciousness (Post- Awake and Alert procedure): Post Debridement Measurements  of Total Wound [name], [name] ([id number]) [id number] Page 2 of 10 Length: (cm) 1.8 Stage: Unstageable/Unclassified Width: (cm) 2.7 Depth: (cm) 0.2 Volume: (cm) 0.763 Character of Wound/Ulcer Post Debridement: Stable Post Procedure Diagnosis Same as Pre-procedure Electronic Signature(s) Signed: 12/19/2022 4:05:54 PM By: [name] Signed: 12/19/2022 6:29:11 PM By: [name] Entered By: [name] on 12/19/2022 15:24:47 -------------------------------------------------------------------------------- Debridement Details Patient Name: Date of Service: [name], [name] 12/19/2022 2:15 PM Medical Record Number: [id number] Patient Account Number: [id number] Date of Birth/Sex: Treating RN: Mar 28, 1946 (77 y.o. [name] Provider: [name] Other Clinician: Referring Provider: Treating Provider/Extender: [name] in Treatment: 3 Debridement Performed for Assessment: Wound #4 Right Calcaneus Performed By: Physician [name], [name] Debridement Type: Debridement Level of Consciousness (Pre-procedure): Awake and Alert Pre-procedure Verification/Time Out Yes - 15:25 Taken: Start Time: 15:25 T Area Debrided (L x W): otal 0.7 (cm) x 1.6 (cm) = 1.12 (cm) Tissue and other material debrided: Viable, Non-Viable, Callus, Slough, Subcutaneous, Slough Level: Skin/Subcutaneous Tissue Debridement Description: Excisional Instrument: Curette Bleeding: Minimum Hemostasis Achieved: Pressure Response to Treatment: Procedure was tolerated well Level of Consciousness (Post- Awake and Alert procedure): Post Debridement Measurements of Total Wound Length: (cm) 0.7 Stage: Unstageable/Unclassified Width: (cm) 1.6 Depth: (cm) 0.2 Volume: (cm) 0.176 Character of Wound/Ulcer Post Debridement: Stable Post Procedure Diagnosis Same as Pre-procedure Electronic Signature(s) Signed:  12/19/2022 4:05:54 PM By: [name] Signed: 12/19/2022 6:29:11 PM By: [name] Entered By: [name] on 12/19/2022 15:25:33 Chern, [name] ([id number]) [id number] Page 3 of 10 -------------------------------------------------------------------------------- HPI Details Patient Name: Date of Service: [name], [name] 12/19/2022 2:15 PM Medical Record Number: [id number] Patient Account Number: [id number] Date of Birth/Sex: Treating RN: Sep 11, 1946 (77 y.o. [name] Provider: [name] Other Clinician: Referring Provider: Treating Provider/Extender: [name] in Treatment: 3 History of Present Illness Location: right anterior shin Quality: Patient reports No Pain. Severity: Patient states wound  are getting worse. Duration: Patient has had the wound for > 6 months prior to seeking treatment at the wound center Context: The wound appeared gradually over time Modifying Factors: Other treatment(s) tried include: taken doxycycline and use bacitracin ssociated Signs and Symptoms: Patient reports presence of swelling A HPI Description: 77 year old male recently seen by his PCP Dr. [name] who saw him for a wound on the right leg which is less tender and he is continuing to use bacitracin and has completed her course of doxycycline. Past medical history of coronary artery disease, diabetes mellitus type 1, hyperlipidemia, hypertension, MI, plantar fascial fibromatosis, pneumonia, status post foot surgery due to trauma on the left side, coronary angioplasty. he is a former smoker and quit in July 1996. last hemoglobin A1c was 7.8 % in April of this year 5/22 2017 -- biopsy of the right lower extremity was sent for pathology and the report is that of a basal cell carcinoma with edges of the biopsy are involved. I have called the patient over the phone( 03/28/16) and discussed the  pathology report with him and he is agreeable to see a surgeon for excision and need full. I have also spoken personally to Dr. [name], of [facility] and referred the patient for further wide excision of this lesion and have communicated this to the patient. READMISSION 08/12/18 This is a now 77 year old man with type 1 diabetes. Hemoglobin A1c on 4/29 was 7.9. He is listed as having a history of PAD in the [place] records although the patient doesn't recall this, doesn't complain of any symptoms of claudication and doesn't believe he has had any prior noninvasive arterial test. He is a nonsmoker. He was here in 2017 with a wound on his Right lower leg. This was biopsied in the clinic and pathology showed a basal cell carcinoma. He went on to have surgery on this area this is currently where I believe his current wounds are located. He generally bumped the lateral part of his right calf 2 weeks ago and has a superficial abrasion. He also has 2 small open areas on the medial part of the tibia in the same area. Our intake nurse noted a stitch coming out of this which was removed. These of the wound sees actually come to the clinic for. However the more concerning area is an area on the tip of the left great toe. He says that this was initially traumatic and he's been to see Dr. [name] podiatrist. Darral Dash been using what I think is Santyl to the wound tip. This has some depth. ABIs in this clinic were non-obtainable on the right and 1.84 on the left. 08/19/18; the patient had arterial studies that did not show evidence of significant hemodynamically compromising occlusions in either leg. There was mild medial atherosclerosis bilaterally. His resting ABIs were within the normal limits at 1.3 on the right and 1.4 on the left. There was normal posterior and anterior tibial artery waveforms and velocities listed on both sides. The patient arrives with what appears to be a healthy surface over the tip  of his left great toe. This is indeed surprising. Still looks somewhat friable however. More concerning is the x-ray that we did that showed erosions of the tuft of the distal phalanx the left great toe consistent with osteomyelitis. I attempted to pull this x-ray up on colon health Link however I can't open the film to look at this myself. 08/26/18; I reviewed the patient's x-ray and colon helpline.  Indeed there is fairly obvious erosion of the tip of his left great toe. The wound was initially a traumatic area hitting it sometime in the middle of night in his kitchen. He is a type I diabetic. I have him on Flagyl and Levaquin that I started last week i.e. 7 days ago. He has an appointment with infectious disease on 09/01/18 09/02/18; the patient was seen by Dr. Delaine Lame of infectious disease. She did not feel the patient needed IV antibiotics. I do feel he needs further oral antibiotics however. He does not need anymore Flagyl but he does need another 2 weeks of Levaquin. The areas just about closed. 09/16/18; the patient is completing his Levaquin today. There is no open wound on the tip of the toe Readmission: 11-26-2022 upon evaluation today patient presents for initial inspection here in our clinic concerning issues that he has been having after having been hospitalized due to septic arthritis. Subsequently he has a PICC line that is been removed and he has been discharged from infectious disease in that regard. Unfortunately he developed shearing wounds over the gluteal region as well as a pressure ulcer over each heel although they are not looking extremely bad nonetheless they do seem to be evidence of pressure which she sustained while hospitalized. Obviously he was very sick and is still recovering as far as that is concerned. Fortunately there does not appear to be any evidence of active infection systemically which is good news at this point according to Dr. Ardyth Gal. Patient's wounds  also do not appear to be too bad at this point which is good news. Patient does have a history of type 1 diabetes mellitus, peripheral vascular disease, coronary artery disease, congestive heart failure, hypertension, and he is on long-term anticoagulant therapy. 12-03-2022 upon evaluation today patient appears to be doing better to some degree in regard to his ulcers on his heels at this point. We are still waiting on the actual appointment with the vascular surgeons but nonetheless in the meantime I do believe that he is making some good progress currently with regard to loosen up the eschar. I did actually feel like that there was some ability for [place] to remove some of the necrotic tissue today and I discussed that with the patient. He is in agreement with that plan. We also can probably see about making an adjustment in the treatment regimen. RORIK, CRUMMIE ([id number]) [id number] Page 4 of 10 12-10-2022 upon evaluation today patient's wounds were really doing about the same. Again he is still awaiting the evaluation with the vascular surgeon. That should have already been done but had to be pushed back unfortunately. He will be seeing them next week on the seventh. I plan to see him following somewhere around the end of the week I think will probably be best. The patient voiced understanding. 12-19-2022 upon evaluation today patient appears to be doing well currently in regard to his wound we did get the results of his arterial studies which show that he has excellent ABIs with no signs of vascular compromise. For that reason I will go ahead and perform some debridement today to clearway the necrotic debris with his Eliquis that I will be too aggressive so this will still be a stepwise process but we are to go ahead and get that started. Electronic Signature(s) Signed: 12/19/2022 5:52:42 PM By: [name] Entered By: Worthy Keeler on 12/19/2022  17:52:42 -------------------------------------------------------------------------------- Physical Exam Details Patient Name: Date of Service: [name]  [name], [name] 12/19/2022 2:15 PM Medical Record Number: [id number] Patient Account Number: [id number] Date of Birth/Sex: Treating RN: 09/06/1946 (77 y.o. [name] Provider: [name] Other Clinician: Referring Provider: Treating Provider/Extender: [name] in Treatment: 3 Constitutional Well-nourished and well-hydrated in no acute distress. Respiratory normal breathing without difficulty. Psychiatric this patient is able to make decisions and demonstrates good insight into disease process. Alert and Oriented x 3. pleasant and cooperative. Notes Upon inspection patient's wound bed did require sharp debridement clearway some of the necrotic debris he tolerated this today without complication and postdebridement wound bed appears to be doing much better which is great news. Electronic Signature(s) Signed: 12/19/2022 5:52:59 PM By: [name] Entered By: Worthy Keeler on 12/19/2022 17:52:58 -------------------------------------------------------------------------------- Physician Orders Details Patient Name: Date of Service: [name], [name] 12/19/2022 2:15 PM Medical Record Number: [id number] Patient Account Number: [id number] Date of Birth/Sex: Treating RN: 06/28/1946 (77 y.o. [name] Provider: [name] Other Clinician: Referring Provider: Treating Provider/Extender: [name] in Treatment: [place], [name] ([id number]) [id number] Page 5 of 10 Verbal / Phone Orders: No Diagnosis Coding ICD-10 Coding Code Description E10.621 Type 1 diabetes mellitus with foot ulcer I73.89 Other specified peripheral vascular diseases L89.610 Pressure ulcer of right heel, unstageable L89.620 Pressure  ulcer of left heel, unstageable L98.412 Non-pressure chronic ulcer of buttock with fat layer exposed I25.10 Atherosclerotic heart disease of native coronary artery without angina pectoris I50.42 Chronic combined systolic (congestive) and diastolic (congestive) heart failure I10 Essential (primary) hypertension Follow-up Appointments Return Appointment in 1 week. [facility] for wound care. May utilize formulary equivalent dressing for wound treatment orders unless otherwise specified. Home Health Nurse may visit PRN to address patients wound care needs. Alvis Lemmings fax [phone] Bathing/ Shower/ Hygiene May shower; gently cleanse wound with antibacterial soap, rinse and pat dry prior to dressing wounds Anesthetic (Use 'Patient Medications' Section for Anesthetic Order Entry) Lidocaine applied to wound bed Edema Control - Lymphedema / Segmental Compressive Device / Other Elevate, Exercise Daily and A void Standing for Long Periods of Time. Elevate legs to the level of the heart and pump ankles as often as possible Elevate leg(s) parallel to the floor when sitting. Wound Treatment Wound #4 - Calcaneus Wound Laterality: Right Cleanser: Soap and Water 1 x Per Day/30 Days Discharge Instructions: Gently cleanse wound with antibacterial soap, rinse and pat dry prior to dressing wounds Prim Dressing: IODOFLEX 0.9% Cadexomer Iodine Pad (Generic) 1 x Per Day/30 Days ary Discharge Instructions: Apply Iodoflex to wound bed only as directed. Secondary Dressing: (BORDER) Zetuvit Plus SILICONE BORDER Dressing 4x4 (in/in) (Generic) 1 x Per Day/30 Days Discharge Instructions: Please do not put silicone bordered dressings under wraps. Use non-bordered dressing only. Wound #5 - Calcaneus Wound Laterality: Left Cleanser: Soap and Water 1 x Per Day/30 Days Discharge Instructions: Gently cleanse wound with antibacterial soap, rinse and pat dry prior to dressing wounds Prim Dressing:  IODOFLEX 0.9% Cadexomer Iodine Pad (Generic) 1 x Per Day/30 Days ary Discharge Instructions: Apply Iodoflex to wound bed only as directed. Secondary Dressing: (BORDER) Zetuvit Plus SILICONE BORDER Dressing 4x4 (in/in) (Generic) 1 x Per Day/30 Days Discharge Instructions: Please do not put silicone bordered dressings under wraps. Use non-bordered dressing only. Electronic Signature(s) Signed: 12/19/2022 4:05:54 PM By: [name] Signed: 12/19/2022 6:29:11 PM By: [name] Entered By: [name] on  12/19/2022 15:25:54 DYONTE, COSGROVE ([id number]) [id number] Page 6 of 10 -------------------------------------------------------------------------------- Problem List Details Patient Name: Date of Service: [name], [name] 12/19/2022 2:15 PM Medical Record Number: [id number] Patient Account Number: [id number] Date of Birth/Sex: Treating RN: 1945/11/28 (77 y.o. 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 11/26/2022 No Yes I25.10 Atherosclerotic heart disease of native coronary artery without angina pectoris 11/26/2022 No Yes I50.42 Chronic combined systolic (congestive) and diastolic (congestive) heart failure 11/26/2022 No Yes I10 Essential (primary) hypertension 11/26/2022 No Yes Inactive Problems Resolved Problems Electronic Signature(s) Signed: 12/19/2022 2:24:44 PM By: [name] Entered  By: Worthy Keeler on 12/19/2022 14:24:44 [place], [name] ([id number]) [id number] Page 7 of 10 -------------------------------------------------------------------------------- Progress Note Details Patient Name: Date of Service: [name], [name] 12/19/2022 2:15 PM Medical Record Number: [id number] Patient Account Number: [id number] Date of Birth/Sex: Treating RN: 02/21/1946 (77 y.o. [name] Provider: [name] Other Clinician: Referring Provider: Treating Provider/Extender: [name] in Treatment: 3 Subjective Chief Complaint Information obtained from Patient Bilateral heel pressure ulcers and sacral pressure ulcer History of Present Illness (HPI) The following HPI elements were documented for the patient's wound: Location: right anterior shin Quality: Patient reports No Pain. Severity: Patient states wound are getting worse. Duration: Patient has had the wound for > 6 months prior to seeking treatment at the wound center Context: The wound appeared gradually over time Modifying Factors: Other treatment(s) tried include: taken doxycycline and use bacitracin Associated Signs and Symptoms: Patient reports presence of swelling 77 year old male recently seen by his PCP Dr. [name] who saw him for a wound on the right leg which is less tender and he is continuing to use bacitracin and has completed her course of doxycycline. Past medical history of coronary artery disease, diabetes mellitus type 1, hyperlipidemia, hypertension, MI, plantar fascial fibromatosis, pneumonia, status post foot surgery due to trauma on the left side, coronary angioplasty. he is a former smoker and quit in July 1996. last hemoglobin A1c was 7.8 % in April of this year 5/22 2017 -- biopsy of the right lower extremity was sent for pathology and the report is that of a basal cell carcinoma with edges of the biopsy are  involved. I have called the patient over the phone( 03/28/16) and discussed the pathology report with him and he is agreeable to see a surgeon for excision and need full. I have also spoken personally to Dr. [name], of [facility] surgical and referred the patient for further wide excision of this lesion and have communicated this to the patient. READMISSION 08/12/18 This is a now 77 year old man with type 1 diabetes. Hemoglobin A1c on 4/29 was 7.9. He is listed as having a history of PAD in the [place] records although the patient doesn't recall this, doesn't complain of any symptoms of claudication and doesn't believe he has had any prior noninvasive arterial test. He is a nonsmoker. He was here in 2017 with a wound on his Right lower leg. This was biopsied in the clinic  and pathology showed a basal cell carcinoma. He went on to have surgery on this area this is currently where I believe his current wounds are located. He generally bumped the lateral part of his right calf 2 weeks ago and has a superficial abrasion. He also has 2 small open areas on the medial part of the tibia in the same area. Our intake nurse noted a stitch coming out of this which was removed. These of the wound sees actually come to the clinic for. However the more concerning area is an area on the tip of the left great toe. He says that this was initially traumatic and he's been to see Dr. [name] podiatrist. Darral Dash been using what I think is Santyl to the wound tip. This has some depth. ABIs in this clinic were non-obtainable on the right and 1.84 on the left. 08/19/18; the patient had arterial studies that did not show evidence of significant hemodynamically compromising occlusions in either leg. There was mild medial atherosclerosis bilaterally. His resting ABIs were within the normal limits at 1.3 on the right and 1.4 on the left. There was normal posterior and anterior tibial artery waveforms and velocities listed on both  sides. The patient arrives with what appears to be a healthy surface over the tip of his left great toe. This is indeed surprising. Still looks somewhat friable however. More concerning is the x-ray that we did that showed erosions of the tuft of the distal phalanx the left great toe consistent with osteomyelitis. I attempted to pull this x-ray up on colon health Link however I can't open the film to look at this myself. 08/26/18; I reviewed the patient's x-ray and colon helpline. Indeed there is fairly obvious erosion of the tip of his left great toe. The wound was initially a traumatic area hitting it sometime in the middle of night in his kitchen. He is a type I diabetic. I have him on Flagyl and Levaquin that I started last week i.e. 7 days ago. He has an appointment with infectious disease on 09/01/18 09/02/18; the patient was seen by Dr. Delaine Lame of infectious disease. She did not feel the patient needed IV antibiotics. I do feel he needs further oral antibiotics however. He does not need anymore Flagyl but he does need another 2 weeks of Levaquin. The areas just about closed. 09/16/18; the patient is completing his Levaquin today. There is no open wound on the tip of the toe Readmission: 11-26-2022 upon evaluation today patient presents for initial inspection here in our clinic concerning issues that he has been having after having been hospitalized due to septic arthritis. Subsequently he has a PICC line that is been removed and he has been discharged from infectious disease in that regard. Unfortunately he developed shearing wounds over the gluteal region as well as a pressure ulcer over each heel although they are not looking extremely bad nonetheless they do seem to be evidence of pressure which she sustained while hospitalized. Obviously he was very sick and is still recovering as far as that is concerned. Fortunately there does not appear to be any evidence of active infection  systemically which is good news at this point according to Dr. Ardyth Gal. Patient's wounds also do not appear to be too bad at this point which is good news. [name], [name] ([id number]) [id number] Page 8 of 10 Patient does have a history of type 1 diabetes mellitus, peripheral vascular disease, coronary artery disease, congestive heart failure, hypertension, and he is  on long-term anticoagulant therapy. 12-03-2022 upon evaluation today patient appears to be doing better to some degree in regard to his ulcers on his heels at this point. We are still waiting on the actual appointment with the vascular surgeons but nonetheless in the meantime I do believe that he is making some good progress currently with regard to loosen up the eschar. I did actually feel like that there was some ability for [place] to remove some of the necrotic tissue today and I discussed that with the patient. He is in agreement with that plan. We also can probably see about making an adjustment in the treatment regimen. 12-10-2022 upon evaluation today patient's wounds were really doing about the same. Again he is still awaiting the evaluation with the vascular surgeon. That should have already been done but had to be pushed back unfortunately. He will be seeing them next week on the seventh. I plan to see him following somewhere around the end of the week I think will probably be best. The patient voiced understanding. 12-19-2022 upon evaluation today patient appears to be doing well currently in regard to his wound we did get the results of his arterial studies which show that he has excellent ABIs with no signs of vascular compromise. For that reason I will go ahead and perform some debridement today to clearway the necrotic debris with his Eliquis that I will be too aggressive so this will still be a stepwise process but we are to go ahead and get that  started. Objective Constitutional Well-nourished and well-hydrated in no acute distress. Vitals Time Taken: 2:38 PM, Height: 69 in, Weight: 190 lbs, BMI: 28.1, Temperature: 98.0 F, Pulse: 99 bpm, Respiratory Rate: 16 breaths/min, Blood Pressure: 146/76 mmHg. Respiratory normal breathing without difficulty. Psychiatric this patient is able to make decisions and demonstrates good insight into disease process. Alert and Oriented x 3. pleasant and cooperative. General Notes: Upon inspection patient's wound bed did require sharp debridement clearway some of the necrotic debris he tolerated this today without complication and postdebridement wound bed appears to be doing much better which is great news. Integumentary (Hair, Skin) Wound #4 status is Open. Original cause of wound was Pressure Injury. The date acquired was: 10/20/2022. The wound has been in treatment 3 weeks. The wound is located on the Right Calcaneus. The wound measures 0.7cm length x 1.6cm width x 0.1cm depth; 0.88cm^2 area and 0.088cm^3 volume. There is a medium amount of serosanguineous drainage noted. Wound #5 status is Open. Original cause of wound was Pressure Injury. The date acquired was: 10/20/2022. The wound has been in treatment 3 weeks. The wound is located on the Left Calcaneus. The wound measures 1.8cm length x 2.7cm width x 0.2cm depth; 3.817cm^2 area and 0.763cm^3 volume. There is a medium amount of serosanguineous drainage noted. Assessment Active Problems ICD-10 Type 1 diabetes mellitus with foot ulcer Other specified peripheral vascular diseases Pressure ulcer of right heel, unstageable Pressure ulcer of left heel, unstageable Non-pressure chronic ulcer of buttock with fat layer exposed Atherosclerotic heart disease of native coronary artery without angina pectoris Chronic combined systolic (congestive) and diastolic (congestive) heart failure Essential (primary) hypertension Procedures Wound  #4 Pre-procedure diagnosis of Wound #4 is a Pressure Ulcer located on the Right Calcaneus . There was a Excisional Skin/Subcutaneous Tissue Debridement with a total area of 1.12 sq cm performed by [name], [name]. With the following instrument(s): Curette to remove Viable and Non-Viable tissue/material. Material removed includes Callus, Subcutaneous Tissue, and Slough.  No specimens were taken. A time out was conducted at 15:25, prior to the start of the procedure. A Minimum amount of bleeding was controlled with Pressure. The procedure was tolerated well. Post Debridement Measurements: 0.7cm length x 1.6cm width x 0.2cm depth; 0.176cm^3 volume. Post debridement Stage noted as Unstageable/Unclassified. Character of Wound/Ulcer Post Debridement is stable. Post procedure Diagnosis Wound #4: Same as Pre-Procedure Wound #5 Pre-procedure diagnosis of Wound #5 is a Pressure Ulcer located on the Left Calcaneus . There was a Excisional Skin/Subcutaneous Tissue Debridement with VIPUL, MCCLEAF ([id number]) [id number] Page 9 of 10 a total area of 4.86 sq cm performed by [name], [name]. With the following instrument(s): Curette to remove Viable and Non-Viable tissue/material. Material removed includes Callus, Subcutaneous Tissue, and Slough. No specimens were taken. A time out was conducted at 15:25, prior to the start of the procedure. A Minimum amount of bleeding was controlled with Pressure. The procedure was tolerated well. Post Debridement Measurements: 1.8cm length x 2.7cm width x 0.2cm depth; 0.763cm^3 volume. Post debridement Stage noted as Unstageable/Unclassified. Character of Wound/Ulcer Post Debridement is stable. Post procedure Diagnosis Wound #5: Same as Pre-Procedure Plan Follow-up Appointments: Return Appointment in 1 week. Home Health: [facility] for [facility] care. May utilize formulary equivalent dressing for wound treatment orders unless  otherwise specified. Home Health Nurse may visit PRN to address patientoos wound care needs. Alvis Lemmings fax [phone] Bathing/ Shower/ Hygiene: May shower; gently cleanse wound with antibacterial soap, rinse and pat dry prior to dressing wounds Anesthetic (Use 'Patient Medications' Section for Anesthetic Order Entry): Lidocaine applied to wound bed Edema Control - Lymphedema / Segmental Compressive Device / Other: Elevate, Exercise Daily and Avoid Standing for Long Periods of Time. Elevate legs to the level of the heart and pump ankles as often as possible Elevate leg(s) parallel to the floor when sitting. WOUND #4: - Calcaneus Wound Laterality: Right Cleanser: Soap and Water 1 x Per Day/30 Days Discharge Instructions: Gently cleanse wound with antibacterial soap, rinse and pat dry prior to dressing wounds Prim Dressing: IODOFLEX 0.9% Cadexomer Iodine Pad (Generic) 1 x Per Day/30 Days ary Discharge Instructions: Apply Iodoflex to wound bed only as directed. Secondary Dressing: (BORDER) Zetuvit Plus SILICONE BORDER Dressing 4x4 (in/in) (Generic) 1 x Per Day/30 Days Discharge Instructions: Please do not put silicone bordered dressings under wraps. Use non-bordered dressing only. WOUND #5: - Calcaneus Wound Laterality: Left Cleanser: Soap and Water 1 x Per Day/30 Days Discharge Instructions: Gently cleanse wound with antibacterial soap, rinse and pat dry prior to dressing wounds Prim Dressing: IODOFLEX 0.9% Cadexomer Iodine Pad (Generic) 1 x Per Day/30 Days ary Discharge Instructions: Apply Iodoflex to wound bed only as directed. Secondary Dressing: (BORDER) Zetuvit Plus SILICONE BORDER Dressing 4x4 (in/in) (Generic) 1 x Per Day/30 Days Discharge Instructions: Please do not put silicone bordered dressings under wraps. Use non-bordered dressing only. 1. I am recommend currently that we have the patient continue to monitor for any signs of infection or worsening. Based on what I am seeing  currently I do believe that he is making fairly good progress here in regard to his wound. 2. I am also going to recommend that he continue with the Iodoflex which I think is doing a good job. 3. Will get a continue with serial debridements until we get the surface of the wound cleaned I do believe that this will need to be undertaken get again next week but nonetheless I am hopeful it will slowly start to  look better and improve so that we can get this complete which is what we are looking for. We will see patient back for reevaluation in 1 week here in the clinic. If anything worsens or changes patient will contact our office for additional recommendations. Electronic Signature(s) Signed: 12/19/2022 5:53:45 PM By: [name] Entered By: Worthy Keeler on 12/19/2022 17:53:45 -------------------------------------------------------------------------------- SuperBill Details Patient Name: Date of Service: [name], [name] 12/19/2022 Medical Record Number: [id number] Patient Account Number: [id number] Date of Birth/Sex: Treating RN: 10-05-1946 (77 y.o. [name] Provider: [name] Other Clinician: Referring Provider: Treating Provider/Extender: [name] in Treatment: 3 Diagnosis Coding KYWAN, TEUTSCH ([id number]) [id number] Page 10 of 10 ICD-10 Codes Code Description E10.621 Type 1 diabetes mellitus with foot ulcer I73.89 Other specified peripheral vascular diseases L89.610 Pressure ulcer of right heel, unstageable L89.620 Pressure ulcer of left heel, unstageable L98.412 Non-pressure chronic ulcer of buttock with fat layer exposed I25.10 Atherosclerotic heart disease of native coronary artery without angina pectoris I50.42 Chronic combined systolic (congestive) and diastolic (congestive) heart failure I10 Essential (primary) hypertension Facility Procedures : CPT4 Code:  [id number] Description: [place] - DEB SUBQ TISSUE 20 SQ CM/< ICD-10 Diagnosis Description L89.610 Pressure ulcer of right heel, unstageable L89.620 Pressure ulcer of left heel, unstageable Modifier: Quantity: 1 Physician Procedures : CPT4 Code Description Modifier [id number] - WC PHYS SUBQ TISS 20 SQ CM ICD-10 Diagnosis Description L89.610 Pressure ulcer of right heel, unstageable L89.620 Pressure ulcer of left heel, unstageable Quantity: 1 Electronic Signature(s) Signed: 12/19/2022 5:53:59 PM By: [name] Entered By: Worthy Keeler on 12/19/2022 17:53:59

## 2022-12-23 ENCOUNTER — Telehealth: Payer: Self-pay | Admitting: Cardiovascular Disease

## 2022-12-23 ENCOUNTER — Telehealth: Payer: Self-pay

## 2022-12-23 NOTE — Telephone Encounter (Signed)
Spoke with patient and friend Saralyn Pilar and informed them of Dr. Donivan Scull recommendations/instructions. "Lab work reviewed  Basic metabolic panel suggesting getting a little bit dehydrated on Lasix daily. Would make sure taking Lasix every other day  Also we can check on blood pressure after Entresto restart." Patient and Saralyn Pilar were satisfied with results and recommendations.

## 2022-12-23 NOTE — Telephone Encounter (Signed)
-----   Message from [name], MD sent at 12/22/2022  9:50 PM EST ----- Lab work reviewed Basic metabolic panel suggesting getting a little bit dehydrated on Lasix daily Would make sure taking Lasix every other day Also we can check on blood pressure after Entresto restart

## 2022-12-23 NOTE — Telephone Encounter (Signed)
Patient was returning phone call 

## 2022-12-25 DIAGNOSIS — M545 Low back pain, unspecified: Secondary | ICD-10-CM | POA: Diagnosis not present

## 2022-12-25 DIAGNOSIS — M47816 Spondylosis without myelopathy or radiculopathy, lumbar region: Secondary | ICD-10-CM | POA: Diagnosis not present

## 2022-12-25 DIAGNOSIS — M00062 Staphylococcal arthritis, left knee: Secondary | ICD-10-CM | POA: Diagnosis not present

## 2022-12-25 DIAGNOSIS — L97521 Non-pressure chronic ulcer of other part of left foot limited to breakdown of skin: Secondary | ICD-10-CM | POA: Diagnosis not present

## 2022-12-26 ENCOUNTER — Other Ambulatory Visit: Admission: RE | Admit: 2022-12-26 | Payer: Medicare HMO | Source: Ambulatory Visit | Admitting: *Deleted

## 2022-12-26 ENCOUNTER — Encounter: Payer: Self-pay | Admitting: Infectious Diseases

## 2022-12-26 ENCOUNTER — Ambulatory Visit: Payer: Medicare HMO | Attending: Infectious Diseases | Admitting: Infectious Diseases

## 2022-12-26 ENCOUNTER — Other Ambulatory Visit
Admission: RE | Admit: 2022-12-26 | Discharge: 2022-12-26 | Disposition: A | Discharge status: Home / Self Care | Payer: Medicare HMO | Source: Ambulatory Visit | Attending: Infectious Diseases | Admitting: Infectious Diseases

## 2022-12-26 VITALS — BP 145/81 | HR 89 | Temp 97.8°F | Wt 171.1 lb

## 2022-12-26 DIAGNOSIS — Z86718 Personal history of other venous thrombosis and embolism: Secondary | ICD-10-CM | POA: Insufficient documentation

## 2022-12-26 DIAGNOSIS — M545 Low back pain, unspecified: Secondary | ICD-10-CM | POA: Diagnosis not present

## 2022-12-26 DIAGNOSIS — Z955 Presence of coronary angioplasty implant and graft: Secondary | ICD-10-CM | POA: Diagnosis not present

## 2022-12-26 DIAGNOSIS — Z7901 Long term (current) use of anticoagulants: Secondary | ICD-10-CM | POA: Diagnosis not present

## 2022-12-26 DIAGNOSIS — B9562 Methicillin resistant Staphylococcus aureus infection as the cause of diseases classified elsewhere: Secondary | ICD-10-CM | POA: Insufficient documentation

## 2022-12-26 DIAGNOSIS — I10 Essential (primary) hypertension: Secondary | ICD-10-CM | POA: Diagnosis not present

## 2022-12-26 DIAGNOSIS — L89629 Pressure ulcer of left heel, unspecified stage: Secondary | ICD-10-CM | POA: Diagnosis not present

## 2022-12-26 DIAGNOSIS — D649 Anemia, unspecified: Secondary | ICD-10-CM | POA: Diagnosis not present

## 2022-12-26 DIAGNOSIS — M00062 Staphylococcal arthritis, left knee: Secondary | ICD-10-CM | POA: Insufficient documentation

## 2022-12-26 DIAGNOSIS — I2510 Atherosclerotic heart disease of native coronary artery without angina pectoris: Secondary | ICD-10-CM | POA: Diagnosis not present

## 2022-12-26 DIAGNOSIS — E1051 Type 1 diabetes mellitus with diabetic peripheral angiopathy without gangrene: Secondary | ICD-10-CM | POA: Insufficient documentation

## 2022-12-26 DIAGNOSIS — Z794 Long term (current) use of insulin: Secondary | ICD-10-CM | POA: Insufficient documentation

## 2022-12-26 LAB — COMPREHENSIVE METABOLIC PANEL
ALT: 17 U/L (ref 0–44)
AST: 23 U/L (ref 15–41)
Albumin: 2.7 g/dL — ABNORMAL LOW (ref 3.5–5.0)
Alkaline Phosphatase: 122 U/L (ref 38–126)
Anion gap: 9 (ref 5–15)
BUN: 23 mg/dL (ref 8–23)
CO2: 24 mmol/L (ref 22–32)
Calcium: 8.1 mg/dL — ABNORMAL LOW (ref 8.9–10.3)
Chloride: 103 mmol/L (ref 98–111)
Creatinine, Ser: 0.82 mg/dL (ref 0.61–1.24)
GFR, Estimated: >60 mL/min (ref >60)
Glucose, Bld: 271 mg/dL — ABNORMAL HIGH (ref 70–99)
Potassium: 4.1 mmol/L (ref 3.5–5.1)
Sodium: 136 mmol/L (ref 135–145)
Total Bilirubin: 0.7 mg/dL (ref 0.3–1.2)
Total Protein: 6.1 g/dL — ABNORMAL LOW (ref 6.5–8.1)

## 2022-12-26 LAB — CBC WITH DIFFERENTIAL/PLATELET
Abs Immature Granulocytes: 0.03 10*3/uL (ref 0.00–0.07)
Basophils Absolute: 0.1 10*3/uL (ref 0.0–0.1)
Basophils Relative: 1 %
Eosinophils Absolute: 0.4 10*3/uL (ref 0.0–0.5)
Eosinophils Relative: 5 %
HCT: 32.5 % — ABNORMAL LOW (ref 39.0–52.0)
Hemoglobin: 10.3 g/dL — ABNORMAL LOW (ref 13.0–17.0)
Immature Granulocytes: 0 %
Lymphocytes Relative: 16 %
Lymphs Abs: 1.3 10*3/uL (ref 0.7–4.0)
MCH: 29.3 pg (ref 26.0–34.0)
MCHC: 31.7 g/dL (ref 30.0–36.0)
MCV: 92.6 fL (ref 80.0–100.0)
Monocytes Absolute: 0.9 10*3/uL (ref 0.1–1.0)
Monocytes Relative: 11 %
Neutro Abs: 5.7 10*3/uL (ref 1.7–7.7)
Neutrophils Relative %: 67 %
Platelets: 384 10*3/uL (ref 150–400)
RBC: 3.51 MIL/uL — ABNORMAL LOW (ref 4.22–5.81)
RDW: 19.8 % — ABNORMAL HIGH (ref 11.5–15.5)
WBC: 8.4 10*3/uL (ref 4.0–10.5)
nRBC: 0 % (ref 0.0–0.2)

## 2022-12-26 LAB — C-REACTIVE PROTEIN: CRP: 3.8 mg/dL — ABNORMAL HIGH (ref <1.0)

## 2022-12-26 MED ORDER — DOXYCYCLINE MONOHYDRATE 100 MG PO TABS: 100.0000 mg | ORAL_TABLET | Freq: Two times a day (BID) | ORAL | 1 refills | Status: DC

## 2022-12-26 MED ORDER — OMEPRAZOLE 20 MG PO CPDR: 20.0000 mg | DELAYED_RELEASE_CAPSULE | Freq: Every day | ORAL | 0 refills | Status: AC
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
## 2022-12-26 NOTE — Patient Instructions (Signed)
You are here for follow up of left knee septic arthritis due to MRSA- you are doing much better- will continue Doxy- will follow up 6 -8weeks

## 2022-12-27 ENCOUNTER — Encounter: Payer: Medicare HMO | Admitting: Physician Assistant

## 2022-12-27 DIAGNOSIS — E10622 Type 1 diabetes mellitus with other skin ulcer: Secondary | ICD-10-CM | POA: Diagnosis not present

## 2022-12-27 DIAGNOSIS — I2510 Atherosclerotic heart disease of native coronary artery without angina pectoris: Secondary | ICD-10-CM | POA: Diagnosis not present

## 2022-12-27 DIAGNOSIS — E1051 Type 1 diabetes mellitus with diabetic peripheral angiopathy without gangrene: Secondary | ICD-10-CM | POA: Diagnosis not present

## 2022-12-27 DIAGNOSIS — I110 Hypertensive heart disease with heart failure: Secondary | ICD-10-CM | POA: Diagnosis not present

## 2022-12-27 DIAGNOSIS — L89620 Pressure ulcer of left heel, unstageable: Secondary | ICD-10-CM | POA: Diagnosis not present

## 2022-12-27 DIAGNOSIS — I5042 Chronic combined systolic (congestive) and diastolic (congestive) heart failure: Secondary | ICD-10-CM | POA: Diagnosis not present

## 2022-12-27 DIAGNOSIS — L89610 Pressure ulcer of right heel, unstageable: Secondary | ICD-10-CM | POA: Diagnosis not present

## 2022-12-27 DIAGNOSIS — L89613 Pressure ulcer of right heel, stage 3: Secondary | ICD-10-CM | POA: Diagnosis not present

## 2022-12-27 DIAGNOSIS — L98412 Non-pressure chronic ulcer of buttock with fat layer exposed: Secondary | ICD-10-CM | POA: Diagnosis not present

## 2022-12-27 DIAGNOSIS — L89623 Pressure ulcer of left heel, stage 3: Secondary | ICD-10-CM | POA: Diagnosis not present

## 2022-12-28 NOTE — Progress Notes (Addendum)
[name], [name] ([id number]) [id number] Page 1 of 10 Visit Report for 12/27/2022 Chief Complaint Document Details Patient Name: Date of Service: [name], [name] 12/27/2022 7:45 A M Medical Record Number: [id number] Patient Account Number: [id number] Date of Birth/Sex: Treating RN: 1946-05-21 (77 y.o. [name] Primary Care Provider: [place] [name] Other Clinician: Referring Provider: Treating Provider/Extender: [name] in Treatment: 4 Information Obtained from: Patient Chief Complaint Bilateral heel pressure ulcers and sacral pressure ulcer Electronic Signature(s) Signed: 12/27/2022 7:58:09 AM By: [name] Keeler [name] Entered By: [name] on 12/27/2022 07:58:09 -------------------------------------------------------------------------------- Debridement Details Patient Name: Date of Service: [name], [name] 12/27/2022 7:45 A M Medical Record Number: [id number] Patient Account Number: [id number] Date of Birth/Sex: Treating RN: May 19, 1946 (77 y.o. [name] Primary Care Provider: [place] [name] Other Clinician: Referring Provider: Treating Provider/Extender: [name] in Treatment: 4 Debridement Performed for Assessment: Wound #5 Left Calcaneus Performed By: Physician [name], [name] Debridement Type: Debridement Level of Consciousness (Pre-procedure): Awake and Alert Pre-procedure Verification/Time Out No Taken: Start Time: 08:23 T Area Debrided (L x W): otal 1.7 (cm) x 2.3 (cm) = 3.91 (cm) Tissue and other material debrided: Viable, Non-Viable, Slough, Subcutaneous, Slough Level: Skin/Subcutaneous Tissue Debridement Description: Excisional Instrument: Curette Bleeding: Moderate Hemostasis Achieved: Pressure Response to Treatment: Procedure was tolerated well Level of Consciousness (Post- Awake and Alert procedure): Post Debridement Measurements of Total  Wound [name], [name] H ([id number]) [id number] Page 2 of 10 Length: (cm) 1.7 Stage: Unstageable/Unclassified Width: (cm) 2.3 Depth: (cm) 0.2 Volume: (cm) 0.614 Character of Wound/Ulcer Post Debridement: Requires Further Debridement Post Procedure Diagnosis Same as Pre-procedure Electronic Signature(s) Signed: 12/27/2022 1:17:52 PM By: [name] Keeler [name] Signed: 12/27/2022 1:32:16 PM By: [name] Entered By: [name] on 12/27/2022 08:24:20 -------------------------------------------------------------------------------- Debridement Details Patient Name: Date of Service: [name], [name] 12/27/2022 7:45 A M Medical Record Number: [id number] Patient Account Number: [id number] Date of Birth/Sex: Treating RN: Dec 16, 1945 (77 y.o. [name] Primary Care Provider: [place] [name] Other Clinician: Referring Provider: Treating Provider/Extender: [name] in Treatment: 4 Debridement Performed for Assessment: Wound #4 Right Calcaneus Performed By: Physician [name], [name] Debridement Type: Debridement Level of Consciousness (Pre-procedure): Awake and Alert Pre-procedure Verification/Time Out No Taken: Start Time: 08:23 T Area Debrided (L x W): otal 0.5 (cm) x 0.9 (cm) = 0.45 (cm) Tissue and other material debrided: Viable, Non-Viable, Slough, Subcutaneous, Slough Level: Skin/Subcutaneous Tissue Debridement Description: Excisional Instrument: Curette Bleeding: Moderate Hemostasis Achieved: Pressure Response to Treatment: Procedure was tolerated well Level of Consciousness (Post- Awake and Alert procedure): Post Debridement Measurements of Total Wound Length: (cm) 0.5 Stage: Unstageable/Unclassified Width: (cm) 0.9 Depth: (cm) 0.2 Volume: (cm) 0.071 Character of Wound/Ulcer Post Debridement: Requires Further Debridement Post Procedure Diagnosis Same as Pre-procedure Electronic  Signature(s) Signed: 12/27/2022 1:17:52 PM By: [name] Keeler [name] Signed: 12/27/2022 1:32:16 PM By: [name] Entered By: [name] on 12/27/2022 08:25:46 [name] ([id number]) [id number] Page 3 of 10 -------------------------------------------------------------------------------- HPI Details Patient Name: Date of Service: [name], [name] 12/27/2022 7:45 A M Medical Record Number: [id number] Patient Account Number: [id number] Date of Birth/Sex: Treating RN: 1946-09-06 (77 y.o. [name] Primary Care Provider: [place] [name] Other Clinician: Referring Provider: Treating Provider/Extender: [name] in Treatment: 4 History of Present Illness Location: right anterior shin Quality: Patient reports No Pain. Severity: Patient  states wound are getting worse. Duration: Patient has had the wound for > 6 months prior to seeking treatment at the wound center Context: The wound appeared gradually over time Modifying Factors: Other treatment(s) tried include: taken doxycycline and use bacitracin ssociated Signs and Symptoms: Patient reports presence of swelling A HPI Description: 77 year old male recently seen by his PCP Dr. Tawnya [name] who saw him for a wound on the right leg which is less tender and he is continuing to use bacitracin and has completed her course of doxycycline. Past medical history of coronary artery disease, diabetes mellitus type 1, hyperlipidemia, hypertension, MI, plantar fascial fibromatosis, pneumonia, status post foot surgery due to trauma on the left side, coronary angioplasty. he is a former smoker and quit in July 1996. last hemoglobin A1c was 7.8 % in April of this year 5/22 2017 -- biopsy of the right lower extremity was sent for pathology and the report is that of a basal cell carcinoma with edges of the biopsy are involved. I have called the patient over the phone(  03/28/16) and discussed the pathology report with him and he is agreeable to see a surgeon for excision and need full. I have also spoken personally to Dr. Dahlia [name], of [facility] surgical and referred the patient for further wide excision of this lesion and have communicated this to the patient. READMISSION 08/12/18 This is a now 77 year old man with type 1 diabetes. Hemoglobin A1c on 4/29 was 7.9. He is listed as having a history of PAD in the [place] records although the patient doesn't recall this, doesn't complain of any symptoms of claudication and doesn't believe he has had any prior noninvasive arterial test. He is a nonsmoker. He was here in 2017 with a wound on his Right lower leg. This was biopsied in the clinic and pathology showed a basal cell carcinoma. He went on to have surgery on this area this is currently where I believe his current wounds are located. He generally bumped the lateral part of his right calf 2 weeks ago and has a superficial abrasion. He also has 2 small open areas on the medial part of the tibia in the same area. Our intake nurse noted a stitch coming out of this which was removed. These of the wound sees actually come to the clinic for. However the more concerning area is an area on the tip of the left great toe. He says that this was initially traumatic and he's been to see Dr. [name] podiatrist. Darral Dash been using what I think is Santyl to the wound tip. This has some depth. ABIs in this clinic were non-obtainable on the right and 1.84 on the left. 08/19/18; the patient had arterial studies that did not show evidence of significant hemodynamically compromising occlusions in either leg. There was mild medial atherosclerosis bilaterally. His resting ABIs were within the normal limits at 1.3 on the right and 1.4 on the left. There was normal posterior and anterior tibial artery waveforms and velocities listed on both sides. The patient arrives with what appears to be a  healthy surface over the tip of his left great toe. This is indeed surprising. Still looks somewhat friable however. More concerning is the x-ray that we did that showed erosions of the tuft of the distal phalanx the left great toe consistent with osteomyelitis. I attempted to pull this x-ray up on colon health Link however I can't open the film to look at this myself. 08/26/18; I reviewed the patient's x-ray and  colon helpline. Indeed there is fairly obvious erosion of the tip of his left great toe. The wound was initially a traumatic area hitting it sometime in the middle of night in his kitchen. He is a type I diabetic. I have him on Flagyl and Levaquin that I started last week i.e. 7 days ago. He has an appointment with infectious disease on 09/01/18 09/02/18; the patient was seen by Dr. Delaine Lame of infectious disease. She did not feel the patient needed IV antibiotics. I do feel he needs further oral antibiotics however. He does not need anymore Flagyl but he does need another 2 weeks of Levaquin. The areas just about closed. 09/16/18; the patient is completing his Levaquin today. There is no open wound on the tip of the toe Readmission: 11-26-2022 upon evaluation today patient presents for initial inspection here in our clinic concerning issues that he has been having after having been hospitalized due to septic arthritis. Subsequently he has a PICC line that is been removed and he has been discharged from infectious disease in that regard. Unfortunately he developed shearing wounds over the gluteal region as well as a pressure ulcer over each heel although they are not looking extremely bad nonetheless they do seem to be evidence of pressure which she sustained while hospitalized. Obviously he was very sick and is still recovering as far as that is concerned. Fortunately there does not appear to be any evidence of active infection systemically which is good news at this point according to  Dr. Ardyth Gal. Patient's wounds also do not appear to be too bad at this point which is good news. Patient does have a history of type 1 diabetes mellitus, peripheral vascular disease, coronary artery disease, congestive heart failure, hypertension, and he is on long-term anticoagulant therapy. 12-03-2022 upon evaluation today patient appears to be doing better to some degree in regard to his ulcers on his heels at this point. We are still waiting on the actual appointment with the vascular surgeons but nonetheless in the meantime I do believe that he is making some good progress currently with regard to loosen up the eschar. I did actually feel like that there was some ability for [place] to remove some of the necrotic tissue today and I discussed that with the patient. He is in agreement with that plan. We also can probably see about making an adjustment in the treatment regimen. [name], [name] ([id number]) [id number] Page 4 of 10 12-10-2022 upon evaluation today patient's wounds were really doing about the same. Again he is still awaiting the evaluation with the vascular surgeon. That should have already been done but had to be pushed back unfortunately. He will be seeing them next week on the seventh. I plan to see him following somewhere around the end of the week I think will probably be best. The patient voiced understanding. 12-19-2022 upon evaluation today patient appears to be doing well currently in regard to his wound we did get the results of his arterial studies which show that he has excellent ABIs with no signs of vascular compromise. For that reason I will go ahead and perform some debridement today to clearway the necrotic debris with his Eliquis that I will be too aggressive so this will still be a stepwise process but we are to go ahead and get that started. 12-27-2022 upon evaluation today patient appears to be doing well currently in regard to his  wounds. They are actually showing signs of excellent improvement. There is some  necrotic tissue noted on the surface of wounds to work on clearing some of that away today also I think that we may switch to [facility] dressing to see how things do going forward. He is in agreement with that plan. Subsequently I am extremely pleased with where we stand currently. No fevers, chills, nausea, vomiting, or diarrhea. Electronic Signature(s) Signed: 12/27/2022 8:42:11 AM By: [name] Keeler [name] Entered By: [name] on 12/27/2022 08:42:10 -------------------------------------------------------------------------------- Physical Exam Details Patient Name: Date of Service: [name], [name] 12/27/2022 7:45 A M Medical Record Number: [id number] Patient Account Number: [id number] Date of Birth/Sex: Treating RN: September 22, 1946 (77 y.o. [name] Primary Care Provider: [place] [name] Other Clinician: Referring Provider: Treating Provider/Extender: [name] in Treatment: 4 Constitutional Well-nourished and well-hydrated in no acute distress. Respiratory normal breathing without difficulty. Psychiatric this patient is able to make decisions and demonstrates good insight into disease process. Alert and Oriented x 3. pleasant and cooperative. Notes Upon inspection patient's wound bed actually showed signs of good granulation epithelization at this point. Fortunately I do not see any evidence of active infection locally nor systemically which is great news and overall I am extremely pleased with where things stand at this point. Electronic Signature(s) Signed: 12/27/2022 8:42:26 AM By: [name] Keeler [name] Entered By: [name] on 12/27/2022 08:42:26 -------------------------------------------------------------------------------- Physician Orders Details Patient Name: Date of Service: [name], [name] 12/27/2022 7:45 A M Medical Record Number:  [id number] Patient Account Number: [id number] Date of Birth/Sex: Treating RN: 05-10-1946 (77 y.o. [name], [name], [name] ([id number]) [id number] Page 5 of 10 Primary Care Provider: [place] [name] Other Clinician: Referring Provider: Treating Provider/Extender: [name] in Treatment: 4 Verbal / Phone Orders: No Diagnosis Coding ICD-10 Coding Code Description E10.621 Type 1 diabetes mellitus with foot ulcer I73.89 Other specified peripheral vascular diseases L89.610 Pressure ulcer of right heel, unstageable L89.620 Pressure ulcer of left heel, unstageable L98.412 Non-pressure chronic ulcer of buttock with fat layer exposed I25.10 Atherosclerotic heart disease of native coronary artery without angina pectoris I50.42 Chronic combined systolic (congestive) and diastolic (congestive) heart failure I10 Essential (primary) hypertension Follow-up Appointments Return Appointment in 1 week. [place] for wound care. May utilize formulary equivalent dressing for wound treatment orders unless otherwise specified. Home Health Nurse may visit [facility] to address patients wound care needs. Alvis Lemmings fax [phone] Do not order the [facility] yet. Bathing/ Shower/ Hygiene May shower; gently cleanse wound with antibacterial soap, rinse and pat dry prior to dressing wounds Anesthetic (Use 'Patient Medications' Section for Anesthetic Order Entry) Lidocaine applied to wound bed Edema Control - Lymphedema / Segmental Compressive Device / Other Elevate, Exercise Daily and A void Standing for Long Periods of Time. Elevate legs to the level of the heart and pump ankles as often as possible Elevate leg(s) parallel to the floor when sitting. Wound Treatment Wound #4 - Calcaneus Wound Laterality: Right Cleanser: Soap and Water 1 x Per Day/30 Days Discharge Instructions: Gently cleanse wound with antibacterial soap,  rinse and pat dry prior to dressing wounds Prim Dressing: Hydrofera Blue Ready Transfer Foam, 2.5x2.5 (in/in) 1 x Per Day/30 Days ary Discharge Instructions: Apply Hydrofera Blue Ready to wound bed as directed Secondary Dressing: (BORDER) Zetuvit Plus SILICONE BORDER Dressing 4x4 (in/in) (Generic) 1 x Per Day/30 Days Discharge Instructions: Please do not put silicone bordered dressings under wraps. Use non-bordered dressing only. Wound #5 - Calcaneus Wound  Laterality: Left Cleanser: Soap and Water 1 x Per Day/30 Days Discharge Instructions: Gently cleanse wound with antibacterial soap, rinse and pat dry prior to dressing wounds Prim Dressing: Hydrofera Blue Ready Transfer Foam, 2.5x2.5 (in/in) 1 x Per Day/30 Days ary Discharge Instructions: Apply Hydrofera Blue Ready to wound bed as directed Secondary Dressing: (BORDER) Zetuvit Plus SILICONE BORDER Dressing 4x4 (in/in) (Generic) 1 x Per Day/30 Days Discharge Instructions: Please do not put silicone bordered dressings under wraps. Use non-bordered dressing only. Electronic Signature(s) Signed: 12/27/2022 1:17:52 PM By: [name] Keeler [name] Signed: 12/27/2022 1:32:16 PM By: [name] Entered By: [name] on 12/27/2022 08:30:46 Canelo, [name] ([id number]) [id number] Page 6 of 10 -------------------------------------------------------------------------------- Problem List Details Patient Name: Date of Service: [name], [name] 12/27/2022 7:45 A M Medical Record Number: [id number] Patient Account Number: [id number] Date of Birth/Sex: Treating RN: 1946/08/29 (77 y.o. [name] Primary Care Provider: [place] [name] Other Clinician: Referring Provider: Treating Provider/Extender: [name] in Treatment: 4 Active Problems ICD-10 Encounter Code Description Active Date MDM Diagnosis E10.621 Type 1 diabetes mellitus with foot ulcer 11/26/2022 No  Yes I73.89 Other specified peripheral vascular diseases 11/26/2022 No Yes L89.613 Pressure ulcer of right heel, stage 3 11/26/2022 No Yes L89.623 Pressure ulcer of left heel, stage 3 11/26/2022 No Yes L98.412 Non-pressure chronic ulcer of buttock with fat layer exposed 11/26/2022 No Yes I25.10 Atherosclerotic heart disease of native coronary artery without angina pectoris 11/26/2022 No Yes I50.42 Chronic combined systolic (congestive) and diastolic (congestive) heart failure 11/26/2022 No Yes I10 Essential (primary) hypertension 11/26/2022 No Yes Inactive Problems Resolved Problems Electronic Signature(s) Signed: 12/27/2022 8:49:04 AM By: [name] Keeler [name] Previous Signature: 12/27/2022 7:58:05 AM Version By: [name] Keeler [name] Entered By: [name] on 12/27/2022 08:49:03 Prime, [name] ([id number]) [id number] Page 7 of 10 -------------------------------------------------------------------------------- Progress Note Details Patient Name: Date of Service: [name], [name] 12/27/2022 7:45 A M Medical Record Number: [id number] Patient Account Number: [id number] Date of Birth/Sex: Treating RN: [birth year] (77 y.o. [name] Primary Care Provider: [place] [name] Other Clinician: Referring Provider: Treating Provider/Extender: [name] in Treatment: 4 Subjective Chief Complaint Information obtained from Patient Bilateral heel pressure ulcers and sacral pressure ulcer History of Present Illness (HPI) The following HPI elements were documented for the patient's wound: Location: right anterior shin Quality: Patient reports No Pain. Severity: Patient states wound are getting worse. Duration: Patient has had the wound for > 6 months prior to seeking treatment at the wound center Context: The wound appeared gradually over time Modifying Factors: Other treatment(s) tried include: taken doxycycline and use  bacitracin Associated Signs and Symptoms: Patient reports presence of swelling 77 year old male recently seen by his PCP Dr. Tawnya [name] who saw him for a wound on the right leg which is less tender and he is continuing to use bacitracin and has completed her course of doxycycline. Past medical history of coronary artery disease, diabetes mellitus type 1, hyperlipidemia, hypertension, MI, plantar fascial fibromatosis, pneumonia, status post foot surgery due to trauma on the left side, coronary angioplasty. he is a former smoker and quit in July 1996. last hemoglobin A1c was 7.8 % in April of this year 5/22 2017 -- biopsy of the right lower extremity was sent for pathology and the report is that of a basal cell carcinoma with edges of the biopsy are involved. I have called the patient over the phone( 03/28/16) and discussed  the pathology report with him and he is agreeable to see a surgeon for excision and need full. I have also spoken personally to Dr. Dahlia [name], of [facility] surgical and referred the patient for further wide excision of this lesion and have communicated this to the patient. READMISSION 08/12/18 This is a now 77 year old man with type 1 diabetes. Hemoglobin A1c on 4/29 was 7.9. He is listed as having a history of PAD in the [place] records although the patient doesn't recall this, doesn't complain of any symptoms of claudication and doesn't believe he has had any prior noninvasive arterial test. He is a nonsmoker. He was here in 2017 with a wound on his Right lower leg. This was biopsied in the clinic and pathology showed a basal cell carcinoma. He went on to have surgery on this area this is currently where I believe his current wounds are located. He generally bumped the lateral part of his right calf 2 weeks ago and has a superficial abrasion. He also has 2 small open areas on the medial part of the tibia in the same area. Our intake nurse noted a stitch coming out of this which was  removed. These of the wound sees actually come to the clinic for. However the more concerning area is an area on the tip of the left great toe. He says that this was initially traumatic and he's been to see Dr. [name] podiatrist. Darral Dash been using what I think is Santyl to the wound tip. This has some depth. ABIs in this clinic were non-obtainable on the right and 1.84 on the left. 08/19/18; the patient had arterial studies that did not show evidence of significant hemodynamically compromising occlusions in either leg. There was mild medial atherosclerosis bilaterally. His resting ABIs were within the normal limits at 1.3 on the right and 1.4 on the left. There was normal posterior and anterior tibial artery waveforms and velocities listed on both sides. The patient arrives with what appears to be a healthy surface over the tip of his left great toe. This is indeed surprising. Still looks somewhat friable however. More concerning is the x-ray that we did that showed erosions of the tuft of the distal phalanx the left great toe consistent with osteomyelitis. I attempted to pull this x-ray up on colon health Link however I can't open the film to look at this myself. 08/26/18; I reviewed the patient's x-ray and colon helpline. Indeed there is fairly obvious erosion of the tip of his left great toe. The wound was initially a traumatic area hitting it sometime in the middle of night in his kitchen. He is a type I diabetic. I have him on Flagyl and Levaquin that I started last week i.e. 7 days ago. He has an appointment with infectious disease on 09/01/18 09/02/18; the patient was seen by Dr. Delaine Lame of infectious disease. She did not feel the patient needed IV antibiotics. I do feel he needs further oral antibiotics however. He does not need anymore Flagyl but he does need another 2 weeks of Levaquin. The areas just about closed. 09/16/18; the patient is completing his Levaquin today. There is no open  wound on the tip of the toe Readmission: 11-26-2022 upon evaluation today patient presents for initial inspection here in our clinic concerning issues that he has been having after having been hospitalized due to septic arthritis. Subsequently he has a PICC line that is been removed and he has been discharged from infectious disease in that regard. Unfortunately  he developed shearing wounds over the gluteal region as well as a pressure ulcer over each heel although they are not looking extremely bad nonetheless they do seem to be evidence of pressure which she sustained while hospitalized. Obviously he was very sick and is still recovering as far as that is concerned. Fortunately there does not appear to be any evidence of active infection systemically which is good news at this point according to Dr. Ardyth Gal. Patient's wounds also do not appear to be too bad at this point which is good news. [name], [name] ([id number]) [id number] Page 8 of 10 Patient does have a history of type 1 diabetes mellitus, peripheral vascular disease, coronary artery disease, congestive heart failure, hypertension, and he is on long-term anticoagulant therapy. 12-03-2022 upon evaluation today patient appears to be doing better to some degree in regard to his ulcers on his heels at this point. We are still waiting on the actual appointment with the vascular surgeons but nonetheless in the meantime I do believe that he is making some good progress currently with regard to loosen up the eschar. I did actually feel like that there was some ability for [place] to remove some of the necrotic tissue today and I discussed that with the patient. He is in agreement with that plan. We also can probably see about making an adjustment in the treatment regimen. 12-10-2022 upon evaluation today patient's wounds were really doing about the same. Again he is still awaiting the evaluation with the vascular  surgeon. That should have already been done but had to be pushed back unfortunately. He will be seeing them next week on the seventh. I plan to see him following somewhere around the end of the week I think will probably be best. The patient voiced understanding. 12-19-2022 upon evaluation today patient appears to be doing well currently in regard to his wound we did get the results of his arterial studies which show that he has excellent ABIs with no signs of vascular compromise. For that reason I will go ahead and perform some debridement today to clearway the necrotic debris with his Eliquis that I will be too aggressive so this will still be a stepwise process but we are to go ahead and get that started. 12-27-2022 upon evaluation today patient appears to be doing well currently in regard to his wounds. They are actually showing signs of excellent improvement. There is some necrotic tissue noted on the surface of wounds to work on clearing some of that away today also I think that we may switch to [facility] dressing to see how things do going forward. He is in agreement with that plan. Subsequently I am extremely pleased with where we stand currently. No fevers, chills, nausea, vomiting, or diarrhea. Objective Constitutional Well-nourished and well-hydrated in no acute distress. Vitals Time Taken: 7:53 AM, Height: 69 in, Weight: 190 lbs, BMI: 28.1, Temperature: 97.6 F, Pulse: 98 bpm, Respiratory Rate: 16 breaths/min, Blood Pressure: 115/60 mmHg. Respiratory normal breathing without difficulty. Psychiatric this patient is able to make decisions and demonstrates good insight into disease process. Alert and Oriented x 3. pleasant and cooperative. General Notes: Upon inspection patient's wound bed actually showed signs of good granulation epithelization at this point. Fortunately I do not see any evidence of active infection locally nor systemically which is great news and overall I am  extremely pleased with where things stand at this point. Integumentary (Hair, Skin) Wound #4 status is Open. Original cause of wound was Pressure  Injury. The date acquired was: 10/20/2022. The wound has been in treatment 4 weeks. The wound is located on the Right Calcaneus. The wound measures 0.5cm length x 0.9cm width x 0.1cm depth; 0.353cm^2 area and 0.035cm^3 volume. There is a medium amount of serosanguineous drainage noted. Wound #5 status is Open. Original cause of wound was Pressure Injury. The date acquired was: 10/20/2022. The wound has been in treatment 4 weeks. The wound is located on the Left Calcaneus. The wound measures 1.7cm length x 2.3cm width x 0.1cm depth; 3.071cm^2 area and 0.307cm^3 volume. There is a medium amount of serosanguineous drainage noted. Assessment Active Problems ICD-10 Type 1 diabetes mellitus with foot ulcer Other specified peripheral vascular diseases Pressure ulcer of right heel, stage 3 Pressure ulcer of left heel, stage 3 Non-pressure chronic ulcer of buttock with fat layer exposed Atherosclerotic heart disease of native coronary artery without angina pectoris Chronic combined systolic (congestive) and diastolic (congestive) heart failure Essential (primary) hypertension Procedures Wound #4 Pre-procedure diagnosis of Wound #4 is a Pressure Ulcer located on the Right Calcaneus . There was a Excisional Skin/Subcutaneous Tissue Debridement with a total area of 0.45 sq cm performed by [name], [name]. With the following instrument(s): Curette to remove Viable and Non-Viable tissue/material. Material removed includes Subcutaneous Tissue and Slough and. No specimens were taken.A Moderate amount of bleeding was controlled with Pressure. The procedure was tolerated well. Post Debridement Measurements: 0.5cm length x 0.9cm width x 0.2cm depth; 0.071cm^3 volume. Post debridement Stage noted as Unstageable/Unclassified. Character of Wound/Ulcer Post  Debridement requires further debridement. [name], [name] ([id number]) [id number] Page 9 of 10 Post procedure Diagnosis Wound #4: Same as Pre-Procedure Wound #5 Pre-procedure diagnosis of Wound #5 is a Pressure Ulcer located on the Left Calcaneus . There was a Excisional Skin/Subcutaneous Tissue Debridement with a total area of 3.91 sq cm performed by [name], [name]. With the following instrument(s): Curette to remove Viable and Non-Viable tissue/material. Material removed includes Subcutaneous Tissue and Slough and. No specimens were taken.A Moderate amount of bleeding was controlled with Pressure. The procedure was tolerated well. Post Debridement Measurements: 1.7cm length x 2.3cm width x 0.2cm depth; 0.614cm^3 volume. Post debridement Stage noted as Unstageable/Unclassified. Character of Wound/Ulcer Post Debridement requires further debridement. Post procedure Diagnosis Wound #5: Same as Pre-Procedure Plan Follow-up Appointments: Return Appointment in 1 week. Home Health: [facility] for wound care. May utilize formulary equivalent dressing for wound treatment orders unless otherwise specified. Home Health Nurse may visit PRN to address patientoos wound care needs. Alvis Lemmings fax [phone] Do not order the [facility] yet. Bathing/ Shower/ Hygiene: May shower; gently cleanse wound with antibacterial soap, rinse and pat dry prior to dressing wounds Anesthetic (Use 'Patient Medications' Section for Anesthetic Order Entry): Lidocaine applied to wound bed Edema Control - Lymphedema / Segmental Compressive Device / Other: Elevate, Exercise Daily and Avoid Standing for Long Periods of Time. Elevate legs to the level of the heart and pump ankles as often as possible Elevate leg(s) parallel to the floor when sitting. WOUND #4: - Calcaneus Wound Laterality: Right Cleanser: Soap and Water 1 x Per Day/30 Days Discharge Instructions: Gently  cleanse wound with antibacterial soap, rinse and pat dry prior to dressing wounds Prim Dressing: Hydrofera Blue Ready Transfer Foam, 2.5x2.5 (in/in) 1 x Per Day/30 Days ary Discharge Instructions: Apply Hydrofera Blue Ready to wound bed as directed Secondary Dressing: (BORDER) Zetuvit Plus SILICONE BORDER Dressing 4x4 (in/in) (Generic) 1 x Per Day/30 Days Discharge Instructions:  Please do not put silicone bordered dressings under wraps. Use non-bordered dressing only. WOUND #5: - Calcaneus Wound Laterality: Left Cleanser: Soap and Water 1 x Per Day/30 Days Discharge Instructions: Gently cleanse wound with antibacterial soap, rinse and pat dry prior to dressing wounds Prim Dressing: Hydrofera Blue Ready Transfer Foam, 2.5x2.5 (in/in) 1 x Per Day/30 Days ary Discharge Instructions: Apply Hydrofera Blue Ready to wound bed as directed Secondary Dressing: (BORDER) Zetuvit Plus SILICONE BORDER Dressing 4x4 (in/in) (Generic) 1 x Per Day/30 Days Discharge Instructions: Please do not put silicone bordered dressings under wraps. Use non-bordered dressing only. 1. I am going to suggest that we have the patient continue to monitor for any evidence of infection or worsening. Based on what I see right now I think that were on the right track with both wounds. Nonetheless I am going to switch to [facility] I think this may do better than the Iodoflex currently. 2. I am also can recommend that the patient should continue the bordered foam dressing to cover. 3. He is going to stick with his compression socks at this point although I would recommend that he may need to switch out for juxta lites his shin as he gets those regularly Ordering that as quickly as possible. We will see patient back for reevaluation in 1 week here in the clinic. If anything worsens or changes patient will contact our office for additional recommendations. Electronic Signature(s) Signed: 12/27/2022 8:49:30 AM By: [name] Keeler  [name] Previous Signature: 12/27/2022 8:43:21 AM Version By: [name] Keeler [name] Entered By: [name] on 12/27/2022 08:49:30 -------------------------------------------------------------------------------- SuperBill Details Patient Name: Date of Service: [name], [name] 12/27/2022 Medical Record Number: [id number] Patient Account Number: [id number] Date of Birth/Sex: Treating RN: 02/12/1946 (77 y.o. [name] Primary Care Provider: [place] [name] Other Clinician: EMERSEN, [name] ([id number]) [id number] Page 10 of 10 Referring Provider: Treating Provider/Extender: [name] in Treatment: 4 Diagnosis Coding ICD-10 Codes Code Description E10.621 Type 1 diabetes mellitus with foot ulcer I73.89 Other specified peripheral vascular diseases L89.613 Pressure ulcer of right heel, stage 3 L89.623 Pressure ulcer of left heel, stage 3 L98.412 Non-pressure chronic ulcer of buttock with fat layer exposed I25.10 Atherosclerotic heart disease of native coronary artery without angina pectoris I50.42 Chronic combined systolic (congestive) and diastolic (congestive) heart failure I10 Essential (primary) hypertension Facility Procedures : CPT4 Code: [id number] Description: [id number] - DEB SUBQ TISSUE 20 SQ CM/< ICD-10 Diagnosis Description L89.613 Pressure ulcer of right heel, stage 3 L89.623 Pressure ulcer of left heel, stage 3 Modifier: Quantity: 1 Physician Procedures : CPT4 Code Description Modifier [id number] - WC PHYS SUBQ TISS 20 SQ CM ICD-10 Diagnosis Description L89.613 Pressure ulcer of right heel, stage 3 L89.623 Pressure ulcer of left heel, stage 3 Quantity: 1 Electronic Signature(s) Signed: 12/27/2022 8:49:43 AM By: [name] Keeler [name] Entered By: [name] on 12/27/2022 08:49:43

## 2022-12-28 NOTE — Progress Notes (Signed)
[name], [name] ([id number]) [id number] Page 1 of 10 Visit Report for 12/27/2022 Arrival Information Details Patient Name: Date of Service: [name], [name] 12/27/2022 7:45 A M Medical Record Number: [id number] Patient Account Number: [id number] Date of Birth/Sex: Treating RN: Mar 30, 1946 (77 y.o. [name] Primary Care [name]: [name] Other Clinician: Referring [name]: Treating [name]/Extender: [name] in Treatment: 4 Visit Information History Since Last Visit Added or deleted any medications: No Patient Arrived: [name] Any new allergies or adverse reactions: No Arrival Time: 07:42 Had a fall or experienced change in No Accompanied By: partner activities of daily living that may affect Transfer Assistance: None risk of falls: Patient Identification Verified: Yes Signs or symptoms of abuse/neglect since last visito No Secondary Verification Process Completed: Yes Hospitalized since last visit: No Patient Requires Transmission-Based Precautions: No Implantable device outside of the clinic excluding No Patient Has Alerts: Yes cellular tissue based products placed in the center Patient Alerts: Type I Diabetic since last visit: eliquis Has Dressing in Place as Prescribed: Yes ABI/TBI normal see scan Pain Present Now: No Electronic Signature(s) Signed: 12/27/2022 1:28:15 PM By: [name] Loud MSN RN CNS WTA Entered By: [name] on 12/27/2022 13:28:15 -------------------------------------------------------------------------------- Clinic Level of Care Assessment Details Patient Name: Date of Service: [name], [name] 12/27/2022 7:45 A M Medical Record Number: [id number] Patient Account Number: [id number] Date of Birth/Sex: Treating RN: 20-Jun-1946 (77 y.o. [name] Primary Care [name]: [name] Other Clinician: Referring [name]: Treating [name]/Extender: [name] in Treatment: 4 Clinic Level of Care Assessment Items TOOL 1 Quantity Score []$  - 0 Use when EandM and Procedure is performed on INITIAL visit ASSESSMENTS - Nursing Assessment / Reassessment []$  - 0 General Physical Exam (combine w/ comprehensive assessment (listed just below) when performed on new pt. evals) []$  - 0 Comprehensive Assessment (HX, ROS, Risk Assessments, Wounds Hx, etc.) [name] ([id number]) [id number] Page 2 of 10 ASSESSMENTS - Wound and Skin Assessment / Reassessment []$  - 0 Dermatologic / Skin Assessment (not related to wound area) ASSESSMENTS - Ostomy and/or Continence Assessment and Care []$  - 0 Incontinence Assessment and Management []$  - 0 Ostomy Care Assessment and Management (repouching, etc.) PROCESS - Coordination of Care []$  - 0 Simple Patient / Family Education for ongoing care []$  - 0 Complex (extensive) Patient / Family Education for ongoing care []$  - 0 Staff obtains Programmer, systems, Records, T Results / Process Orders est []$  - 0 Staff telephones HHA, Nursing Homes / Clarify orders / etc []$  - 0 Routine Transfer to another Facility (non-emergent condition) []$  - 0 Routine Hospital Admission (non-emergent condition) []$  - 0 New Admissions / Biomedical engineer / Ordering NPWT Apligraf, etc. , []$  - 0 Emergency Hospital Admission (emergent condition) PROCESS - Special Needs []$  - 0 Pediatric / Minor Patient Management []$  - 0 Isolation Patient Management []$  - 0 Hearing / Language / Visual special needs []$  - 0 Assessment of Community assistance (transportation, D/C planning, etc.) []$  - 0 Additional assistance / Altered mentation []$  - 0 Support Surface(s) Assessment (bed, cushion, seat, etc.) INTERVENTIONS - Miscellaneous []$  - 0 External ear exam []$  - 0 Patient Transfer (multiple staff / Civil Service fast streamer / Similar devices) []$  - 0 Simple Staple / Suture removal (25 or less) []$  - 0 Complex  Staple / Suture removal (26 or more) []$  - 0 Hypo/Hyperglycemic Management (do not check if billed separately) []$  - 0 Ankle / Brachial Index (ABI) - do  not check if billed separately Has the patient been seen at the hospital within the last three years: Yes Total Score: 0 Level Of Care: ____ Electronic Signature(s) Signed: 12/27/2022 1:32:16 PM By: [name] Loud MSN RN CNS WTA Entered By: [name] on 12/27/2022 08:30:55 -------------------------------------------------------------------------------- Encounter Discharge Information Details Patient Name: Date of Service: [name], [name] 12/27/2022 7:45 A M Medical Record Number: [id number] Patient Account Number: [id number] Date of Birth/Sex: Treating RN: 06-26-1946 (77 y.o. [name] Primary Care [name]: [name] Other Clinician: Referring Seren [name]: Treating [name]/Extender: [name] in Treatment: [name], [name] ([id number]) [id number] Page 3 of 10 Encounter Discharge Information Items Post Procedure Vitals Discharge Condition: Stable Temperature (F): 97.6 Ambulatory Status: Walker Pulse (bpm): 98 Discharge Destination: Home Respiratory Rate (breaths/min): 16 Transportation: Private Auto Blood Pressure (mmHg): 115/60 Accompanied By: Partner Schedule Follow-up Appointment: Yes Clinical Summary of Care: Electronic Signature(s) Signed: 12/27/2022 1:32:16 PM By: [name] Loud MSN RN CNS WTA Entered By: [name] on 12/27/2022 08:32:50 -------------------------------------------------------------------------------- Lower Extremity Assessment Details Patient Name: Date of Service: [name], [name] 12/27/2022 7:45 A M Medical Record Number: [id number] Patient Account Number: [id number] Date of Birth/Sex: Treating RN: June 28, 1946 (77 y.o. [name] Primary Care [name]: [name] Other Clinician: Referring [name]: Treating  [name]/Extender: [name] in Treatment: 4 Edema Assessment Assessed: [name]: Yes] [name]: Yes] [Left: Edema] [Right: :] Calf Left: Right: Point of Measurement: 34 cm From Medial Instep 37.5 cm 34.5 cm Ankle Left: Right: Point of Measurement: 10 cm From Medial Instep 26 cm 26 cm Vascular Assessment Pulses: Dorsalis Pedis Palpable: [Left:Yes] [Right:Yes] Electronic Signature(s) Signed: 12/27/2022 1:28:33 PM By: [name] Loud MSN RN CNS WTA Entered By: [name] on 12/27/2022 13:28:32 Jiggetts, [name] ([id number]) [id number] Page 4 of 10 -------------------------------------------------------------------------------- Multi Wound Chart Details Patient Name: Date of Service: [name], [name] 12/27/2022 7:45 A M Medical Record Number: [id number] Patient Account Number: [id number] Date of Birth/Sex: Treating RN: 01/07/1946 (77 y.o. [name] Primary Care Dornell Grasmick: [name] Other Clinician: Referring Linsi Humann: Treating Jani Ploeger/Extender: [name] in Treatment: 4 Vital Signs Height(in): 40 Pulse(bpm): 59 Weight(lbs): 190 Blood Pressure(mmHg): 115/60 Body Mass Index(BMI): 28.1 Temperature(F): 97.6 Respiratory Rate(breaths/min): 16 [4:Photos:] [N/A:N/A] Right Calcaneus Left Calcaneus N/A Wound Location: Pressure Injury Pressure Injury N/A Wounding Event: Pressure Ulcer Pressure Ulcer N/A Primary Etiology: Coronary Artery Disease, Coronary Artery Disease, N/A Comorbid History: Hypertension, Type I Diabetes, Hypertension, Type I Diabetes, Osteoarthritis, Received Osteoarthritis, Received Chemotherapy Chemotherapy 10/20/2022 10/20/2022 N/A Date Acquired: 4 4 N/A Weeks of Treatment: Open Open N/A Wound Status: No No N/A Wound Recurrence: 0.5x0.9x0.1 1.7x2.3x0.1 N/A Measurements L x W x D (cm) 0.353 3.071 N/A A (cm) : rea 0.035 0.307 N/A Volume (cm) : 92.50%  -552.00% N/A % Reduction in A rea: 92.60% -553.20% N/A % Reduction in Volume: Unstageable/Unclassified Unstageable/Unclassified N/A Classification: Medium Medium N/A Exudate A mount: Serosanguineous Serosanguineous N/A Exudate Type: red, brown red, brown N/A Exudate Color: Fascia: No Fascia: No N/A Exposed Structures: Fat Layer (Subcutaneous Tissue): No Fat Layer (Subcutaneous Tissue): No Tendon: No Tendon: No Muscle: No Muscle: No Joint: No Joint: No Bone: No Bone: No Treatment Notes Electronic Signature(s) Signed: 12/27/2022 1:32:16 PM By: [name] Loud MSN RN CNS WTA Entered By: [name] on 12/27/2022 08:13:23 Sun, [name] ([id number]) [id number] Page 5 of 10 -------------------------------------------------------------------------------- Multi-Disciplinary Care Plan Details Patient Name: Date of Service: [name], [name] 12/27/2022 7:45 A M  Medical Record Number: [id number] Patient Account Number: [id number] Date of Birth/Sex: Treating RN: Feb 09, 1946 (77 y.o. [name] Primary Care Aadyn Buchheit: [name] Other Clinician: Referring Elford Evilsizer: Treating Tenoch Mcclure/Extender: [name] in Treatment: 4 Active Inactive Necrotic Tissue Nursing Diagnoses: Knowledge deficit related to management of necrotic/devitalized tissue Goals: Patient/caregiver will verbalize understanding of reason and process for debridement of necrotic tissue Date Initiated: 11/26/2022 Target Resolution Date: 12/03/2022 Goal Status: Active Interventions: Assess patient pain level pre-, during and post procedure and prior to discharge Notes: Pressure Nursing Diagnoses: Potential for impaired tissue integrity related to pressure, friction, moisture, and shear Goals: Patient will remain free of pressure ulcers Date Initiated: 11/26/2022 Target Resolution Date: 12/24/2022 Goal Status: Active Interventions: Assess:  immobility, friction, shearing, incontinence upon admission and as needed Assess offloading mechanisms upon admission and as needed Assess potential for pressure ulcer upon admission and as needed Notes: Wound/Skin Impairment Nursing Diagnoses: Knowledge deficit related to ulceration/compromised skin integrity Goals: Patient/caregiver will verbalize understanding of skin care regimen Date Initiated: 11/26/2022 Target Resolution Date: 12/03/2022 Goal Status: Active Ulcer/skin breakdown will have a volume reduction of 30% by week 4 Date Initiated: 11/26/2022 Target Resolution Date: 12/24/2022 Goal Status: Active Ulcer/skin breakdown will have a volume reduction of 50% by week 8 Date Initiated: 11/26/2022 Target Resolution Date: 01/21/2023 Goal Status: Active Ulcer/skin breakdown will have a volume reduction of 80% by week 12 Date Initiated: 11/26/2022 Target Resolution Date: 02/18/2023 Goal Status: Active Ulcer/skin breakdown will heal within 14 weeks Date Initiated: 11/26/2022 Target Resolution Date: 03/18/2023 MARCIAL, THAI ([id number]) [id number] Page 6 of 10 Goal Status: Active Interventions: Assess patient/caregiver ability to obtain necessary supplies Assess patient/caregiver ability to perform ulcer/skin care regimen upon admission and as needed Assess ulceration(s) every visit Notes: Electronic Signature(s) Signed: 12/27/2022 1:32:16 PM By: [name] Loud MSN RN CNS WTA Entered By: [name] on 12/27/2022 08:31:25 -------------------------------------------------------------------------------- Pain Assessment Details Patient Name: Date of Service: [name], [name] 12/27/2022 7:45 A M Medical Record Number: [id number] Patient Account Number: [id number] Date of Birth/Sex: Treating RN: 1946/01/22 (77 y.o. [name] Primary Care Corderro Koloski: [name] Other Clinician: Referring Yanin Muhlestein: Treating Andria Head/Extender: [name] in Treatment: 4 Active Problems Location of Pain Severity and Description of Pain Patient Has Paino No Site Locations Pain Management and Medication Current Pain Management: Electronic Signature(s) Signed: 12/27/2022 1:28:25 PM By: [name] Loud MSN RN CNS WTA Entered By: [name] on 12/27/2022 13:28:25 Szczygiel, [name] ([id number]) [id number] Page 7 of 10 -------------------------------------------------------------------------------- Patient/Caregiver Education Details Patient Name: Date of Service: [name], [name] Fredda Hammed 2/16/2024andnbsp7:45 A M Medical Record Number: [id number] Patient Account Number: [id number] Date of Birth/Gender: Treating RN: 1945-12-10 (77 y.o. [name] Primary Care Physician: [name] Other Clinician: Referring Physician: Treating Physician/Extender: [name] in Treatment: 4 Education Assessment Education Provided To: Patient Education Topics Provided Wound/Skin Impairment: Handouts: Caring for Your Ulcer Methods: Explain/Verbal Responses: State content correctly Electronic Signature(s) Signed: 12/27/2022 1:32:16 PM By: [name] Loud MSN RN CNS WTA Entered By: [name] on 12/27/2022 08:31:14 -------------------------------------------------------------------------------- Wound Assessment Details Patient Name: Date of Service: [name], [name] 12/27/2022 7:45 A M Medical Record Number: [id number] Patient Account Number: [id number] Date of Birth/Sex: Treating RN: [birth year] (77 y.o. [name] Primary Care Damiean Lukes: [name] Other Clinician: Referring Layaan Mott: Treating Ilianna Bown/Extender: [name] in Treatment: 4 Wound Status Wound Number: 4 Primary Pressure Ulcer Etiology: Wound Location: Right Calcaneus Wound Open  Wounding Event: Pressure Injury Status: Date Acquired: 10/20/2022 Comorbid Coronary  Artery Disease, Hypertension, Type I Diabetes, Weeks Of Treatment: 4 History: Osteoarthritis, Received Chemotherapy Clustered Wound: No Photos KRISTIAN, BATDORF ([id number]) [id number] Page 8 of 10 Wound Measurements Length: (cm) 0.5 Width: (cm) 0.9 Depth: (cm) 0.1 Area: (cm) 0.353 Volume: (cm) 0.035 % Reduction in Area: 92.5% % Reduction in Volume: 92.6% Wound Description Classification: Category/Stage III Exudate Amount: Medium Exudate Type: Serosanguineous Exudate Color: red, brown Wound Bed Exposed Structure Fascia Exposed: No Fat Layer (Subcutaneous Tissue) Exposed: No Tendon Exposed: No Muscle Exposed: No Joint Exposed: No Bone Exposed: No Treatment Notes Wound #4 (Calcaneus) Wound Laterality: Right Cleanser Soap and Water Discharge Instruction: Gently cleanse wound with antibacterial soap, rinse and pat dry prior to dressing wounds Peri-Wound Care Topical Primary Dressing Hydrofera Blue Ready Transfer Foam, 2.5x2.5 (in/in) Discharge Instruction: Apply Hydrofera Blue Ready to wound bed as directed Secondary Dressing (BORDER) Zetuvit Plus SILICONE BORDER Dressing 4x4 (in/in) Discharge Instruction: Please do not put silicone bordered dressings under wraps. Use non-bordered dressing only. Secured With Compression Wrap Compression Stockings Environmental education officer) Signed: 12/27/2022 8:45:16 AM By: [name] Signed: 12/27/2022 1:32:16 PM By: [name] Loud MSN RN CNS WTA Entered By: Worthy Keeler on 12/27/2022 08:45:16 Gartrell, [name] ([id number]) [id number] Page 9 of 10 -------------------------------------------------------------------------------- Wound Assessment Details Patient Name: Date of Service: [name], [name] 12/27/2022 7:45 A M Medical Record Number: [id number] Patient Account Number: [id number] Date of Birth/Sex: Treating RN: February 25, 1946 (77 y.o. [name] Primary Care Jaimi Belle: [name] Other Clinician: Referring Lilburn Straw: Treating Khira Cudmore/Extender: [name] in Treatment: 4 Wound Status Wound Number: 5 Primary Pressure Ulcer Etiology: Wound Location: Left Calcaneus Wound Open Wounding Event: Pressure Injury Status: Date Acquired: 10/20/2022 Comorbid Coronary Artery Disease, Hypertension, Type I Diabetes, Weeks Of Treatment: 4 History: Osteoarthritis, Received Chemotherapy Clustered Wound: No Photos Wound Measurements Length: (cm) 1.7 Width: (cm) 2.3 Depth: (cm) 0.1 Area: (cm) 3.071 Volume: (cm) 0.307 % Reduction in Area: -552% % Reduction in Volume: -553.2% Wound Description Classification: Category/Stage III Exudate Amount: Medium Exudate Type: Serosanguineous Exudate Color: red, brown Wound Bed Exposed Structure Fascia Exposed: No Fat Layer (Subcutaneous Tissue) Exposed: No Tendon Exposed: No Muscle Exposed: No Joint Exposed: No Bone Exposed: No Treatment Notes Wound #5 (Calcaneus) Wound Laterality: Left Cleanser Soap and Water Discharge Instruction: Gently cleanse wound with antibacterial soap, rinse and pat dry prior to dressing wounds Peri-Wound Care Rodenbeck, Rober [name] ([id number]) [id number] Page 10 of 10 Topical Primary Dressing Hydrofera Blue Ready Transfer Foam, 2.5x2.5 (in/in) Discharge Instruction: Apply Hydrofera Blue Ready to wound bed as directed Secondary Dressing (BORDER) Zetuvit Plus SILICONE BORDER Dressing 4x4 (in/in) Discharge Instruction: Please do not put silicone bordered dressings under wraps. Use non-bordered dressing only. Secured With Compression Wrap Compression Stockings Environmental education officer) Signed: 12/27/2022 8:47:11 AM By: [name] Signed: 12/27/2022 1:32:16 PM By: [name] Loud MSN RN CNS WTA Entered By: Worthy Keeler on 12/27/2022  08:47:11 -------------------------------------------------------------------------------- Vitals Details Patient Name: Date of Service: [name], [name] 12/27/2022 7:45 A M Medical Record Number: [id number] Patient Account Number: [id number] Date of Birth/Sex: Treating RN: [birth year] (77 y.o. [name] Primary Care Eulonda Andalon: [name] Other Clinician: Referring Markeese Boyajian: Treating Joshau Code/Extender: [name] in Treatment: 4 Vital Signs Time Taken: 07:53 Temperature (F): 97.6 Height (in): 69 Pulse (bpm): 98 Weight (lbs): 190 Respiratory Rate (breaths/min): 16 Body Mass Index (BMI): 28.1 Blood Pressure (  mmHg): 115/60 Reference Range: 80 - 120 mg / dl Electronic Signature(s) Signed: 12/27/2022 1:28:20 PM By: [name] Loud MSN RN CNS WTA Entered By: [name] on 12/27/2022 13:28:20

## 2023-01-02 ENCOUNTER — Encounter: Payer: Medicare HMO | Admitting: Physician Assistant

## 2023-01-02 DIAGNOSIS — E1051 Type 1 diabetes mellitus with diabetic peripheral angiopathy without gangrene: Secondary | ICD-10-CM | POA: Diagnosis not present

## 2023-01-02 DIAGNOSIS — L98412 Non-pressure chronic ulcer of buttock with fat layer exposed: Secondary | ICD-10-CM | POA: Diagnosis not present

## 2023-01-02 DIAGNOSIS — L89613 Pressure ulcer of right heel, stage 3: Secondary | ICD-10-CM | POA: Diagnosis not present

## 2023-01-02 DIAGNOSIS — I2510 Atherosclerotic heart disease of native coronary artery without angina pectoris: Secondary | ICD-10-CM | POA: Diagnosis not present

## 2023-01-02 DIAGNOSIS — I110 Hypertensive heart disease with heart failure: Secondary | ICD-10-CM | POA: Diagnosis not present

## 2023-01-02 DIAGNOSIS — E10622 Type 1 diabetes mellitus with other skin ulcer: Secondary | ICD-10-CM | POA: Diagnosis not present

## 2023-01-02 DIAGNOSIS — L89623 Pressure ulcer of left heel, stage 3: Secondary | ICD-10-CM | POA: Diagnosis not present

## 2023-01-02 DIAGNOSIS — I5042 Chronic combined systolic (congestive) and diastolic (congestive) heart failure: Secondary | ICD-10-CM | POA: Diagnosis not present

## 2023-01-03 NOTE — Progress Notes (Signed)
[name], [name] ([id number]) [id number] Page 1 of 10 Visit Report for 01/02/2023 Chief Complaint Document Details Patient Name: Date of Service: [name], [name] 01/02/2023 9:30 A M Medical Record Number: [id number] Patient Account Number: [id number] Date of Birth/Sex: Treating RN: 1946/02/05 (77 y.o. [name] Provider: [name] Other Clinician: Referring Provider: Treating Provider/Extender: [name] in Treatment: 5 Information Obtained from: Patient Chief Complaint Bilateral heel pressure ulcers and sacral pressure ulcer Electronic Signature(s) Signed: 01/02/2023 9:40:59 AM By: [name] Entered By: Worthy Keeler on 01/02/2023 09:40:58 -------------------------------------------------------------------------------- Debridement Details Patient Name: Date of Service: [name], [name] 01/02/2023 9:30 A M Medical Record Number: [id number] Patient Account Number: [id number] Date of Birth/Sex: Treating RN: 1946-03-07 (77 y.o. [name] Provider: [name] Other Clinician: Referring Provider: Treating Provider/Extender: [name] in Treatment: 5 Debridement Performed for Assessment: Wound #4 Right Calcaneus Performed By: Physician [name], [name] Debridement Type: Debridement Level of Consciousness (Pre-procedure): Awake and Alert Pre-procedure Verification/Time Out No Taken: Start Time: 10:22 T Area Debrided (L x W): otal 0.3 (cm) x 0.4 (cm) = 0.12 (cm) Tissue and other material debrided: Viable, Non-Viable, Skin: Dermis , Skin: Epidermis Level: Skin/Epidermis Debridement Description: Selective/Open Wound Instrument: Curette Bleeding: Minimum Hemostasis Achieved: Pressure Response to Treatment: Procedure was tolerated well Level of Consciousness (Post- Awake and Alert procedure): Post Debridement Measurements of  Total Wound Ressler, Lem H ([id number]) [id number] Page 2 of 10 Length: (cm) 0.3 Stage: Category/Stage III Width: (cm) 0.4 Depth: (cm) 0.4 Volume: (cm) 0.038 Character of Wound/Ulcer Post Debridement: Stable Post Procedure Diagnosis Same as Pre-procedure Electronic Signature(s) Signed: 01/02/2023 12:20:58 PM By: [name] Signed: 01/03/2023 5:35:32 PM By: [name] Entered By: [name] on 01/02/2023 10:22:24 -------------------------------------------------------------------------------- Debridement Details Patient Name: Date of Service: [name], [name] 01/02/2023 9:30 A M Medical Record Number: [id number] Patient Account Number: [id number] Date of Birth/Sex: Treating RN: 1946-04-27 (77 y.o. [name] Provider: [name] Other Clinician: Referring Provider: Treating Provider/Extender: [name] in Treatment: 5 Debridement Performed for Assessment: Wound #5 Left Calcaneus Performed By: Physician [name], [name] Debridement Type: Debridement Level of Consciousness (Pre-procedure): Awake and Alert Pre-procedure Verification/Time Out No Taken: Start Time: 10:22 T Area Debrided (L x W): otal 1.7 (cm) x 2.2 (cm) = 3.74 (cm) Tissue and other material debrided: Viable, Non-Viable, Slough, Subcutaneous, Slough Level: Skin/Subcutaneous Tissue Debridement Description: Excisional Instrument: Curette Bleeding: Minimum Hemostasis Achieved: Pressure Response to Treatment: Procedure was tolerated well Level of Consciousness (Post- Awake and Alert procedure): Post Debridement Measurements of Total Wound Length: (cm) 1.7 Stage: Category/Stage III Width: (cm) 2.2 Depth: (cm) 0.2 Volume: (cm) 0.587 Character of Wound/Ulcer Post Debridement: Stable Post Procedure Diagnosis Same as Pre-procedure Electronic Signature(s) Signed: 01/02/2023 12:20:58 PM By: [name] Signed: 01/03/2023 5:35:32 PM By: [name] Entered By: [name] on 01/02/2023 10:23:43 Everly, [name] ([id number]) [id number] Page 3 of 10 -------------------------------------------------------------------------------- HPI Details Patient Name: Date of Service: [name], [name] 01/02/2023 9:30 A M Medical Record Number: [id number] Patient Account Number: [id number] Date of Birth/Sex: Treating RN: 07/13/1946 (77 y.o. [name] Provider: [name] Other Clinician: Referring Provider: Treating Provider/Extender: [name] in Treatment: 5 History of Present Illness Location: right anterior shin Quality: Patient reports No Pain. Severity: Patient  states wound are getting worse. Duration: Patient has had the wound for > 6 months prior to seeking treatment at the wound center Context: The wound appeared gradually over time Modifying Factors: Other treatment(s) tried include: taken doxycycline and use bacitracin ssociated Signs and Symptoms: Patient reports presence of swelling A HPI Description: 77 year old male recently seen by his PCP Dr. [name] who saw him for a wound on the right leg which is less tender and he is continuing to use bacitracin and has completed her course of doxycycline. Past medical history of coronary artery disease, diabetes mellitus type 1, hyperlipidemia, hypertension, MI, plantar fascial fibromatosis, pneumonia, status post foot surgery due to trauma on the left side, coronary angioplasty. he is a former smoker and quit in July 1996. last hemoglobin A1c was 7.8 % in April of this year 5/22 2017 -- biopsy of the right lower extremity was sent for pathology and the report is that of a basal cell carcinoma with edges of the biopsy are involved. I have called the patient over the phone( 03/28/16) and discussed the pathology report with him  and he is agreeable to see a surgeon for excision and need full. I have also spoken personally to Dr. [name], of [facility] surgical and referred the patient for further wide excision of this lesion and have communicated this to the patient. READMISSION 08/12/18 This is a now 77 year old man with type 1 diabetes. Hemoglobin A1c on 4/29 was 7.9. He is listed as having a history of PAD in the [place] records although the patient doesn't recall this, doesn't complain of any symptoms of claudication and doesn't believe he has had any prior noninvasive arterial test. He is a nonsmoker. He was here in 2017 with a wound on his Right lower leg. This was biopsied in the clinic and pathology showed a basal cell carcinoma. He went on to have surgery on this area this is currently where I believe his current wounds are located. He generally bumped the lateral part of his right calf 2 weeks ago and has a superficial abrasion. He also has 2 small open areas on the medial part of the tibia in the same area. Our intake nurse noted a stitch coming out of this which was removed. These of the wound sees actually come to the clinic for. However the more concerning area is an area on the tip of the left great toe. He says that this was initially traumatic and he's been to see Dr. [name] podiatrist. Darral Dash been using what I think is Santyl to the wound tip. This has some depth. ABIs in this clinic were non-obtainable on the right and 1.84 on the left. 08/19/18; the patient had arterial studies that did not show evidence of significant hemodynamically compromising occlusions in either leg. There was mild medial atherosclerosis bilaterally. His resting ABIs were within the normal limits at 1.3 on the right and 1.4 on the left. There was normal posterior and anterior tibial artery waveforms and velocities listed on both sides. The patient arrives with what appears to be a healthy surface over the tip of his left great toe. This  is indeed surprising. Still looks somewhat friable however. More concerning is the x-ray that we did that showed erosions of the tuft of the distal phalanx the left great toe consistent with osteomyelitis. I attempted to pull this x-ray up on colon health Link however I can't open the film to look at this myself. 08/26/18; I reviewed the patient's x-ray and  colon helpline. Indeed there is fairly obvious erosion of the tip of his left great toe. The wound was initially a traumatic area hitting it sometime in the middle of night in his kitchen. He is a type I diabetic. I have him on Flagyl and Levaquin that I started last week i.e. 7 days ago. He has an appointment with infectious disease on 09/01/18 09/02/18; the patient was seen by Dr. Delaine Lame of infectious disease. She did not feel the patient needed IV antibiotics. I do feel he needs further oral antibiotics however. He does not need anymore Flagyl but he does need another 2 weeks of Levaquin. The areas just about closed. 09/16/18; the patient is completing his Levaquin today. There is no open wound on the tip of the toe Readmission: 11-26-2022 upon evaluation today patient presents for initial inspection here in our clinic concerning issues that he has been having after having been hospitalized due to septic arthritis. Subsequently he has a PICC line that is been removed and he has been discharged from infectious disease in that regard. Unfortunately he developed shearing wounds over the gluteal region as well as a pressure ulcer over each heel although they are not looking extremely bad nonetheless they do seem to be evidence of pressure which she sustained while hospitalized. Obviously he was very sick and is still recovering as far as that is concerned. Fortunately there does not appear to be any evidence of active infection systemically which is good news at this point according to Dr. Ardyth Gal. Patient's wounds also do not appear to be too  bad at this point which is good news. Patient does have a history of type 1 diabetes mellitus, peripheral vascular disease, coronary artery disease, congestive heart failure, hypertension, and he is on long-term anticoagulant therapy. 12-03-2022 upon evaluation today patient appears to be doing better to some degree in regard to his ulcers on his heels at this point. We are still waiting on the actual appointment with the vascular surgeons but nonetheless in the meantime I do believe that he is making some good progress currently with regard to loosen up the eschar. I did actually feel like that there was some ability for [place] to remove some of the necrotic tissue today and I discussed that with the patient. He is in agreement with that plan. We also can probably see about making an adjustment in the treatment regimen. SAVYON, MOCKLER ([id number]) [id number] Page 4 of 10 12-10-2022 upon evaluation today patient's wounds were really doing about the same. Again he is still awaiting the evaluation with the vascular surgeon. That should have already been done but had to be pushed back unfortunately. He will be seeing them next week on the seventh. I plan to see him following somewhere around the end of the week I think will probably be best. The patient voiced understanding. 12-19-2022 upon evaluation today patient appears to be doing well currently in regard to his wound we did get the results of his arterial studies which show that he has excellent ABIs with no signs of vascular compromise. For that reason I will go ahead and perform some debridement today to clearway the necrotic debris with his Eliquis that I will be too aggressive so this will still be a stepwise process but we are to go ahead and get that started. 12-27-2022 upon evaluation today patient appears to be doing well currently in regard to his wounds. They are actually showing signs of excellent  improvement. There is some  necrotic tissue noted on the surface of wounds to work on clearing some of that away today also I think that we may switch to [facility] dressing to see how things do going forward. He is in agreement with that plan. Subsequently I am extremely pleased with where we stand currently. No fevers, chills, nausea, vomiting, or diarrhea. 01-02-2023 upon evaluation today patient appears to be doing much better in regard to his wounds the right foot appears to be almost healed the left foot though not completely healed is significantly better. Fortunately there does not appear to be any signs of active infection at this point. Electronic Signature(s) Signed: 01/02/2023 10:32:43 AM By: [name] Entered By: Worthy Keeler on 01/02/2023 10:32:43 -------------------------------------------------------------------------------- Physical Exam Details Patient Name: Date of Service: [name], [name] 01/02/2023 9:30 A M Medical Record Number: [id number] Patient Account Number: [id number] Date of Birth/Sex: Treating RN: 09-Apr-1946 (77 y.o. [name] Provider: [name] Other Clinician: Referring Provider: Treating Provider/Extender: [name] in Treatment: 5 Constitutional Well-nourished and well-hydrated in no acute distress. Respiratory normal breathing without difficulty. Psychiatric this patient is able to make decisions and demonstrates good insight into disease process. Alert and Oriented x 3. pleasant and cooperative. Notes Upon inspection patient's wound bed actually showed signs of need for sharp debridement at both locations on the right I just remove some skin epidermis and dermis over top of the wound and this is actually very close to complete resolution. In regard to the left foot actually performed debridement including callus and slough down to good subcutaneous tissue he tolerated that without  complication. Electronic Signature(s) Signed: 01/02/2023 10:33:11 AM By: [name] Entered By: Worthy Keeler on 01/02/2023 10:33:11 Physician Orders Details -------------------------------------------------------------------------------- [name] ([id number]) [id number] Page 5 of 10 Patient Name: Date of Service: [name], [name] 01/02/2023 9:30 A M Medical Record Number: [id number] Patient Account Number: [id number] Date of Birth/Sex: Treating RN: 09/29/1946 (77 y.o. [name] Provider: [name] Other Clinician: Referring Provider: Treating Provider/Extender: [name] in Treatment: 5 Verbal / Phone Orders: No Diagnosis Coding ICD-10 Coding Code Description E10.621 Type 1 diabetes mellitus with foot ulcer I73.89 Other specified peripheral vascular diseases L89.613 Pressure ulcer of right heel, stage 3 L89.623 Pressure ulcer of left heel, stage 3 L98.412 Non-pressure chronic ulcer of buttock with fat layer exposed I25.10 Atherosclerotic heart disease of native coronary artery without angina pectoris I50.42 Chronic combined systolic (congestive) and diastolic (congestive) heart failure I10 Essential (primary) hypertension Follow-up Appointments Return Appointment in 1 week. [place] for wound care. May utilize formulary equivalent dressing for wound treatment orders unless otherwise specified. Home Health Nurse may visit PRN to address patients wound care needs. Alvis Lemmings fax [phone] Do not order the [facility] Bathing/ Shower/ Hygiene May shower; gently cleanse wound with antibacterial soap, rinse and pat dry prior to dressing wounds Anesthetic (Use 'Patient Medications' Section for Anesthetic Order Entry) Lidocaine applied to wound bed Edema Control - Lymphedema / Segmental Compressive Device / Other Elevate, Exercise Daily and A void  Standing for Long Periods of Time. Elevate legs to the level of the heart and pump ankles as often as possible Elevate leg(s) parallel to the floor when sitting. Wound Treatment Wound #4 - Calcaneus Wound Laterality: Right Cleanser: Soap and Water 1 x Per Day/30 Days Discharge Instructions: Gently cleanse wound with antibacterial soap, rinse  and pat dry prior to dressing wounds Prim Dressing: Hydrofera Blue Ready Transfer Foam, 2.5x2.5 (in/in) 1 x Per Day/30 Days ary Discharge Instructions: Apply Hydrofera Blue Ready to wound bed as directed Secondary Dressing: (BORDER) Zetuvit Plus SILICONE BORDER Dressing 4x4 (in/in) 1 x Per Day/30 Days Discharge Instructions: Please do not put silicone bordered dressings under wraps. Use non-bordered dressing only. Wound #5 - Calcaneus Wound Laterality: Left Cleanser: Soap and Water 1 x Per Day/30 Days Discharge Instructions: Gently cleanse wound with antibacterial soap, rinse and pat dry prior to dressing wounds Prim Dressing: Hydrofera Blue Ready Transfer Foam, 2.5x2.5 (in/in) (DME) (Dispense As Written) 1 x Per Day/30 Days ary Discharge Instructions: Apply Hydrofera Blue Ready to wound bed as directed Secondary Dressing: (BORDER) Zetuvit Plus SILICONE BORDER Dressing 4x4 (in/in) (DME) (Generic) 1 x Per Day/30 Days Discharge Instructions: Please do not put silicone bordered dressings under wraps. Use non-bordered dressing only. Electronic Signature(s) Signed: 01/02/2023 12:20:58 PM By: [name] Signed: 01/03/2023 5:35:32 PM By: [name] Entered By: [name] on 01/02/2023 10:26:13 Witkop, [name] ([id number]) [id number] Page 6 of 10 -------------------------------------------------------------------------------- Problem List Details Patient Name: Date of Service: [name], [name] 01/02/2023 9:30 A M Medical Record Number: [id number] Patient Account Number: [id number] Date of  Birth/Sex: Treating RN: 07/05/1946 (77 y.o. [name] Provider: [name] Other Clinician: Referring Provider: Treating Provider/Extender: [name] in Treatment: 5 Active Problems ICD-10 Encounter Code Description Active Date MDM Diagnosis E10.621 Type 1 diabetes mellitus with foot ulcer 11/26/2022 No Yes I73.89 Other specified peripheral vascular diseases 11/26/2022 No Yes L89.613 Pressure ulcer of right heel, stage 3 11/26/2022 No Yes L89.623 Pressure ulcer of left heel, stage 3 11/26/2022 No Yes L98.412 Non-pressure chronic ulcer of buttock with fat layer exposed 11/26/2022 No Yes I25.10 Atherosclerotic heart disease of native coronary artery without angina pectoris 11/26/2022 No Yes I50.42 Chronic combined systolic (congestive) and diastolic (congestive) heart failure 11/26/2022 No Yes I10 Essential (primary) hypertension 11/26/2022 No Yes Inactive Problems Resolved Problems Electronic Signature(s) Signed: 01/02/2023 12:20:58 PM By: [name] Signed: 01/03/2023 5:35:32 PM By: [name] Previous Signature: 01/02/2023 9:40:55 AM Version By: [name] Entered By: [name] on 01/02/2023 10:26:57 Mistry, [name] ([id number]) [id number] Page 7 of 10 -------------------------------------------------------------------------------- Progress Note Details Patient Name: Date of Service: [name], [name] 01/02/2023 9:30 A M Medical Record Number: [id number] Patient Account Number: [id number] Date of Birth/Sex: Treating RN: 1946/01/03 (77 y.o. [name] Provider: [name] Other Clinician: Referring Provider: Treating Provider/Extender: [name] in Treatment: 5 Subjective Chief Complaint Information obtained from Patient Bilateral heel pressure ulcers and sacral pressure ulcer History of Present Illness (HPI) The  following HPI elements were documented for the patient's wound: Location: right anterior shin Quality: Patient reports No Pain. Severity: Patient states wound are getting worse. Duration: Patient has had the wound for > 6 months prior to seeking treatment at the wound center Context: The wound appeared gradually over time Modifying Factors: Other treatment(s) tried include: taken doxycycline and use bacitracin Associated Signs and Symptoms: Patient reports presence of swelling 77 year old male recently seen by his PCP Dr. [name] who saw him for a wound on the right leg which is less tender and he is continuing to use bacitracin and has completed her course of doxycycline. Past medical history of coronary artery disease, diabetes mellitus type  1, hyperlipidemia, hypertension, MI, plantar fascial fibromatosis, pneumonia, status post foot surgery due to trauma on the left side, coronary angioplasty. he is a former smoker and quit in July 1996. last hemoglobin A1c was 7.8 % in April of this year 5/22 2017 -- biopsy of the right lower extremity was sent for pathology and the report is that of a basal cell carcinoma with edges of the biopsy are involved. I have called the patient over the phone( 03/28/16) and discussed the pathology report with him and he is agreeable to see a surgeon for excision and need full. I have also spoken personally to Dr. [name], of [facility] surgical and referred the patient for further wide excision of this lesion and have communicated this to the patient. READMISSION 08/12/18 This is a now 77 year old man with type 1 diabetes. Hemoglobin A1c on 4/29 was 7.9. He is listed as having a history of PAD in the [place] records although the patient doesn't recall this, doesn't complain of any symptoms of claudication and doesn't believe he has had any prior noninvasive arterial test. He is a nonsmoker. He was here in 2017 with a wound on his Right lower leg. This was biopsied  in the clinic and pathology showed a basal cell carcinoma. He went on to have surgery on this area this is currently where I believe his current wounds are located. He generally bumped the lateral part of his right calf 2 weeks ago and has a superficial abrasion. He also has 2 small open areas on the medial part of the tibia in the same area. Our intake nurse noted a stitch coming out of this which was removed. These of the wound sees actually come to the clinic for. However the more concerning area is an area on the tip of the left great toe. He says that this was initially traumatic and he's been to see Dr. [name] podiatrist. Darral Dash been using what I think is Santyl to the wound tip. This has some depth. ABIs in this clinic were non-obtainable on the right and 1.84 on the left. 08/19/18; the patient had arterial studies that did not show evidence of significant hemodynamically compromising occlusions in either leg. There was mild medial atherosclerosis bilaterally. His resting ABIs were within the normal limits at 1.3 on the right and 1.4 on the left. There was normal posterior and anterior tibial artery waveforms and velocities listed on both sides. The patient arrives with what appears to be a healthy surface over the tip of his left great toe. This is indeed surprising. Still looks somewhat friable however. More concerning is the x-ray that we did that showed erosions of the tuft of the distal phalanx the left great toe consistent with osteomyelitis. I attempted to pull this x-ray up on colon health Link however I can't open the film to look at this myself. 08/26/18; I reviewed the patient's x-ray and colon helpline. Indeed there is fairly obvious erosion of the tip of his left great toe. The wound was initially a traumatic area hitting it sometime in the middle of night in his kitchen. He is a type I diabetic. I have him on Flagyl and Levaquin that I started last week i.e. 7 days ago. He has an  appointment with infectious disease on 09/01/18 09/02/18; the patient was seen by Dr. Delaine Lame of infectious disease. She did not feel the patient needed IV antibiotics. I do feel he needs further oral antibiotics however. He does not need anymore Flagyl but  he does need another 2 weeks of Levaquin. The areas just about closed. 09/16/18; the patient is completing his Levaquin today. There is no open wound on the tip of the toe Readmission: 11-26-2022 upon evaluation today patient presents for initial inspection here in our clinic concerning issues that he has been having after having been hospitalized due to septic arthritis. Subsequently he has a PICC line that is been removed and he has been discharged from infectious disease in that regard. Unfortunately he developed shearing wounds over the gluteal region as well as a pressure ulcer over each heel although they are not looking extremely bad nonetheless they do seem to be evidence of pressure which she sustained while hospitalized. Obviously he was very sick and is still recovering as far as that is concerned. Fortunately there does not appear to be any evidence of active infection systemically which is good news at this point according to Dr. Ardyth Gal. Patient's wounds also do not appear to be too bad at this point which is good news. DREVYN, ROEBUCK ([id number]) [id number] Page 8 of 10 Patient does have a history of type 1 diabetes mellitus, peripheral vascular disease, coronary artery disease, congestive heart failure, hypertension, and he is on long-term anticoagulant therapy. 12-03-2022 upon evaluation today patient appears to be doing better to some degree in regard to his ulcers on his heels at this point. We are still waiting on the actual appointment with the vascular surgeons but nonetheless in the meantime I do believe that he is making some good progress currently with regard to loosen up the eschar.  I did actually feel like that there was some ability for [place] to remove some of the necrotic tissue today and I discussed that with the patient. He is in agreement with that plan. We also can probably see about making an adjustment in the treatment regimen. 12-10-2022 upon evaluation today patient's wounds were really doing about the same. Again he is still awaiting the evaluation with the vascular surgeon. That should have already been done but had to be pushed back unfortunately. He will be seeing them next week on the seventh. I plan to see him following somewhere around the end of the week I think will probably be best. The patient voiced understanding. 12-19-2022 upon evaluation today patient appears to be doing well currently in regard to his wound we did get the results of his arterial studies which show that he has excellent ABIs with no signs of vascular compromise. For that reason I will go ahead and perform some debridement today to clearway the necrotic debris with his Eliquis that I will be too aggressive so this will still be a stepwise process but we are to go ahead and get that started. 12-27-2022 upon evaluation today patient appears to be doing well currently in regard to his wounds. They are actually showing signs of excellent improvement. There is some necrotic tissue noted on the surface of wounds to work on clearing some of that away today also I think that we may switch to [facility] dressing to see how things do going forward. He is in agreement with that plan. Subsequently I am extremely pleased with where we stand currently. No fevers, chills, nausea, vomiting, or diarrhea. 01-02-2023 upon evaluation today patient appears to be doing much better in regard to his wounds the right foot appears to be almost healed the left foot though not completely healed is significantly better. Fortunately there does not appear to be any signs  of active infection at this  point. Objective Constitutional Well-nourished and well-hydrated in no acute distress. Vitals Time Taken: 9:53 AM, Height: 69 in, Weight: 190 lbs, BMI: 28.1, Temperature: 97.4 F, Pulse: 99 bpm, Respiratory Rate: 16 breaths/min, Blood Pressure: 121/68 mmHg. Respiratory normal breathing without difficulty. Psychiatric this patient is able to make decisions and demonstrates good insight into disease process. Alert and Oriented x 3. pleasant and cooperative. General Notes: Upon inspection patient's wound bed actually showed signs of need for sharp debridement at both locations on the right I just remove some skin epidermis and dermis over top of the wound and this is actually very close to complete resolution. In regard to the left foot actually performed debridement including callus and slough down to good subcutaneous tissue he tolerated that without complication. Integumentary (Hair, Skin) Wound #4 status is Open. Original cause of wound was Pressure Injury. The date acquired was: 10/20/2022. The wound has been in treatment 5 weeks. The wound is located on the Right Calcaneus. The wound measures 0.3cm length x 0.4cm width x 0.3cm depth; 0.094cm^2 area and 0.028cm^3 volume. There is a medium amount of serosanguineous drainage noted. Wound #5 status is Open. Original cause of wound was Pressure Injury. The date acquired was: 10/20/2022. The wound has been in treatment 5 weeks. The wound is located on the Left Calcaneus. The wound measures 1.7cm length x 2.2cm width x 0.1cm depth; 2.937cm^2 area and 0.294cm^3 volume. There is a medium amount of serosanguineous drainage noted. Assessment Active Problems ICD-10 Type 1 diabetes mellitus with foot ulcer Other specified peripheral vascular diseases Pressure ulcer of right heel, stage 3 Pressure ulcer of left heel, stage 3 Non-pressure chronic ulcer of buttock with fat layer exposed Atherosclerotic heart disease of native coronary artery without  angina pectoris Chronic combined systolic (congestive) and diastolic (congestive) heart failure Essential (primary) hypertension Procedures Wound #4 Pre-procedure diagnosis of Wound #4 is a Pressure Ulcer located on the Right Calcaneus . There was a Selective/Open Wound Skin/Epidermis Debridement with a total area of 0.12 sq cm performed by [name], [name]. With the following instrument(s): Curette to remove Viable and Non-Viable tissue/material. LAQUINTON, TOOLE ([id number]) [id number] Page 9 of 10 Material removed includes Skin: Dermis and Skin: Epidermis and. No specimens were taken.A Minimum amount of bleeding was controlled with Pressure. The procedure was tolerated well. Post Debridement Measurements: 0.3cm length x 0.4cm width x 0.4cm depth; 0.038cm^3 volume. Post debridement Stage noted as Category/Stage III. Character of Wound/Ulcer Post Debridement is stable. Post procedure Diagnosis Wound #4: Same as Pre-Procedure Wound #5 Pre-procedure diagnosis of Wound #5 is a Pressure Ulcer located on the Left Calcaneus . There was a Excisional Skin/Subcutaneous Tissue Debridement with a total area of 3.74 sq cm performed by [name], [name]. With the following instrument(s): Curette to remove Viable and Non-Viable tissue/material. Material removed includes Subcutaneous Tissue and Slough and. No specimens were taken.A Minimum amount of bleeding was controlled with Pressure. The procedure was tolerated well. Post Debridement Measurements: 1.7cm length x 2.2cm width x 0.2cm depth; 0.587cm^3 volume. Post debridement Stage noted as Category/Stage III. Character of Wound/Ulcer Post Debridement is stable. Post procedure Diagnosis Wound #5: Same as Pre-Procedure Plan Follow-up Appointments: Return Appointment in 1 week. Home Health: [facility] for wound care. May utilize formulary equivalent dressing for wound treatment orders unless otherwise  specified. Home Health Nurse may visit PRN to address patientoos wound care needs. Alvis Lemmings fax [phone] Do not order the [facility] Bathing/ Shower/  Hygiene: May shower; gently cleanse wound with antibacterial soap, rinse and pat dry prior to dressing wounds Anesthetic (Use 'Patient Medications' Section for Anesthetic Order Entry): Lidocaine applied to wound bed Edema Control - Lymphedema / Segmental Compressive Device / Other: Elevate, Exercise Daily and Avoid Standing for Long Periods of Time. Elevate legs to the level of the heart and pump ankles as often as possible Elevate leg(s) parallel to the floor when sitting. WOUND #4: - Calcaneus Wound Laterality: Right Cleanser: Soap and Water 1 x Per Day/30 Days Discharge Instructions: Gently cleanse wound with antibacterial soap, rinse and pat dry prior to dressing wounds Prim Dressing: Hydrofera Blue Ready Transfer Foam, 2.5x2.5 (in/in) 1 x Per Day/30 Days ary Discharge Instructions: Apply Hydrofera Blue Ready to wound bed as directed Secondary Dressing: (BORDER) Zetuvit Plus SILICONE BORDER Dressing 4x4 (in/in) 1 x Per Day/30 Days Discharge Instructions: Please do not put silicone bordered dressings under wraps. Use non-bordered dressing only. WOUND #5: - Calcaneus Wound Laterality: Left Cleanser: Soap and Water 1 x Per Day/30 Days Discharge Instructions: Gently cleanse wound with antibacterial soap, rinse and pat dry prior to dressing wounds Prim Dressing: Hydrofera Blue Ready Transfer Foam, 2.5x2.5 (in/in) (DME) (Dispense As Written) 1 x Per Day/30 Days ary Discharge Instructions: Apply Hydrofera Blue Ready to wound bed as directed Secondary Dressing: (BORDER) Zetuvit Plus SILICONE BORDER Dressing 4x4 (in/in) (DME) (Generic) 1 x Per Day/30 Days Discharge Instructions: Please do not put silicone bordered dressings under wraps. Use non-bordered dressing only. 1. Based on what I am seeing I would recommend currently that we have  the patient go ahead and continue to monitor for any signs of infection or worsening. Obviously based on what I am seeing I think the [facility] is doing a really good job here. 2. I am good recommend that we continue to perform debridements as necessary to keep things moving in the right direction obviously if he stalls we could always consider a skin sub but right now he is doing quite well. We will see patient back for reevaluation in 1 week here in the clinic. If anything worsens or changes patient will contact our office for additional recommendations. Electronic Signature(s) Signed: 01/02/2023 10:33:41 AM By: [name] Entered By: Worthy Keeler on 01/02/2023 10:33:41 -------------------------------------------------------------------------------- SuperBill Details Patient Name: Date of Service: [name], [name] 01/02/2023 Medical Record Number: [id number] Patient Account Number: [id number] Date of Birth/Sex: Treating RN: Oct 15, 1946 (77 y.o. [place], [place], [name] ([id number]) [id number] Page 10 of 10 Primary Care Provider: [name] Other Clinician: Referring Provider: Treating Provider/Extender: [name] in Treatment: 5 Diagnosis Coding ICD-10 Codes Code Description E10.621 Type 1 diabetes mellitus with foot ulcer I73.89 Other specified peripheral vascular diseases L89.613 Pressure ulcer of right heel, stage 3 L89.623 Pressure ulcer of left heel, stage 3 L98.412 Non-pressure chronic ulcer of buttock with fat layer exposed I25.10 Atherosclerotic heart disease of native coronary artery without angina pectoris I50.42 Chronic combined systolic (congestive) and diastolic (congestive) heart failure I10 Essential (primary) hypertension Facility Procedures : CPT4 Code: [id number] Description: 11042 - DEB SUBQ TISSUE 20 SQ CM/< ICD-10 Diagnosis Description L89.623 Pressure ulcer of left heel,  stage 3 Modifier: Quantity: 1 : CPT4 Code: [id number] Description: [id number] - DEBRIDE WOUND 1ST 20 SQ CM OR < ICD-10 Diagnosis Description L89.613 Pressure ulcer of right heel, stage 3 Modifier: Quantity: 1 Physician Procedures : CPT4 Code Description Modifier [id number] 11042 - WC PHYS SUBQ TISS 20 SQ CM  ICD-10 Diagnosis Description L89.623 Pressure ulcer of left heel, stage 3 Quantity: 1 : [id number] - WC PHYS DEBR WO ANESTH 20 SQ CM ICD-10 Diagnosis Description L89.613 Pressure ulcer of right heel, stage 3 Quantity: 1 Electronic Signature(s) Signed: 01/02/2023 10:39:01 AM By: [name] Entered By: Worthy Keeler on 01/02/2023 10:39:00

## 2023-01-03 NOTE — Progress Notes (Signed)
[name], [name] ([id number]) [id number] Page 1 of 9 Visit Report for 01/02/2023 Arrival Information Details Patient Name: Date of Service: [name], [name] 01/02/2023 9:30 A M Medical Record Number: [id number] Patient Account Number: [id number] Date of Birth/Sex: Treating RN: 1946/06/01 (77 y.o. [name] Primary Care [name]: [name] Other Clinician: Referring [name]: Treating [name]/Extender: [name] in Treatment: 5 Visit Information History Since Last Visit Added or deleted any medications: No Patient Arrived: Walker Has Dressing in Place as Prescribed: Yes Arrival Time: 09:52 Pain Present Now: No Accompanied By: self Transfer Assistance: None Patient Identification Verified: Yes Secondary Verification Process Completed: Yes Patient Requires Transmission-Based Precautions: No Patient Has Alerts: Yes Patient Alerts: Type I Diabetic eliquis ABI/TBI normal see scan Electronic Signature(s) Unsigned Entered By: [name] on 01/02/2023 09:53:47 -------------------------------------------------------------------------------- Clinic Level of Care Assessment Details Patient Name: Date of Service: [name], [name] 01/02/2023 9:30 A M Medical Record Number: [id number] Patient Account Number: [id number] Date of Birth/Sex: Treating RN: 07-12-1946 (77 y.o. [name] Primary Care [name]: [name] Other Clinician: Referring [name]: Treating [name]/Extender: [name] in Treatment: 5 Clinic Level of Care Assessment Items TOOL 1 Quantity Score '[]'$  - 0 Use when EandM and Procedure is performed on INITIAL visit ASSESSMENTS - Nursing Assessment / Reassessment '[]'$  - 0 General Physical Exam (combine w/ comprehensive assessment (listed just below) when performed on new pt. evals) '[]'$  - 0 Comprehensive Assessment (HX, ROS, Risk Assessments, Wounds Hx,  etc.) [name], [name] ([id number]) [id number] Page 2 of 9 ASSESSMENTS - Wound and Skin Assessment / Reassessment '[]'$  - 0 Dermatologic / Skin Assessment (not related to wound area) ASSESSMENTS - Ostomy and/or Continence Assessment and Care '[]'$  - 0 Incontinence Assessment and Management '[]'$  - 0 Ostomy Care Assessment and Management (repouching, etc.) PROCESS - Coordination of Care '[]'$  - 0 Simple Patient / Family Education for ongoing care '[]'$  - 0 Complex (extensive) Patient / Family Education for ongoing care '[]'$  - 0 Staff obtains Programmer, systems, Records, T Results / Process Orders est '[]'$  - 0 Staff telephones HHA, Nursing Homes / Clarify orders / etc '[]'$  - 0 Routine Transfer to another Facility (non-emergent condition) '[]'$  - 0 Routine Hospital Admission (non-emergent condition) '[]'$  - 0 New Admissions / Biomedical engineer / Ordering NPWT Apligraf, etc. , '[]'$  - 0 Emergency Hospital Admission (emergent condition) PROCESS - Special Needs '[]'$  - 0 Pediatric / Minor Patient Management '[]'$  - 0 Isolation Patient Management '[]'$  - 0 Hearing / Language / Visual special needs '[]'$  - 0 Assessment of Community assistance (transportation, D/C planning, etc.) '[]'$  - 0 Additional assistance / Altered mentation '[]'$  - 0 Support Surface(s) Assessment (bed, cushion, seat, etc.) INTERVENTIONS - Miscellaneous '[]'$  - 0 External ear exam '[]'$  - 0 Patient Transfer (multiple staff / Civil Service fast streamer / Similar devices) '[]'$  - 0 Simple Staple / Suture removal (25 or less) '[]'$  - 0 Complex Staple / Suture removal (26 or more) '[]'$  - 0 Hypo/Hyperglycemic Management (do not check if billed separately) '[]'$  - 0 Ankle / Brachial Index (ABI) - do not check if billed separately Has the patient been seen at the hospital within the last three years: Yes Total Score: 0 Level Of Care: ____ Electronic Signature(s) Unsigned Entered By: [name] on 01/02/2023  10:26:20 -------------------------------------------------------------------------------- Encounter Discharge Information Details Patient Name: Date of Service: [name], [name] 01/02/2023 9:30 A M Medical Record Number: [id number] Patient Account Number: [id number] Date of Birth/Sex: Treating RN:  Mar 13, 1946 (77 y.o. [name] Primary Care [name]: [name] Other Clinician: [name], [name] ([id number]) [id number] Page 3 of 9 Referring [name]: Treating Alayasia Breeding/Extender: [name] in Treatment: 5 Encounter Discharge Information Items Post Procedure Vitals Discharge Condition: Stable Temperature (F): 97.4 Ambulatory Status: Walker Pulse (bpm): 99 Discharge Destination: Home Respiratory Rate (breaths/min): 16 Transportation: Private Auto Blood Pressure (mmHg): 121/68 Accompanied By: partner Schedule Follow-up Appointment: Yes Clinical Summary of Care: Electronic Signature(s) Unsigned Entered By: [name] on 01/02/2023 10:38:09 -------------------------------------------------------------------------------- Lower Extremity Assessment Details Patient Name: Date of Service: [name], [name] 01/02/2023 9:30 A M Medical Record Number: [id number] Patient Account Number: [id number] Date of Birth/Sex: Treating RN: 03-19-1946 (77 y.o. [name] Primary Care [name]: [name] Other Clinician: Referring [name]: Treating [name]/Extender: [name] in Treatment: 5 Edema Assessment Assessed: [Left: No] [Right: No] [Left: Edema] [Right: :] Calf Left: Right: Point of Measurement: 34 cm From Medial Instep 34.2 cm 34 cm Ankle Left: Right: Point of Measurement: 10 cm From Medial Instep 24 cm 23.3 cm Vascular Assessment Pulses: Dorsalis Pedis Palpable: [Left:Yes] [Right:Yes] Electronic Signature(s) Unsigned Entered By: [name] on 01/02/2023  10:11:08 Signature(s): [name] ([id number]) [id number] Date(s): [id number] Page 4 of 9 -------------------------------------------------------------------------------- Multi Wound Chart Details Patient Name: Date of Service: [name], [name] 01/02/2023 9:30 A M Medical Record Number: [id number] Patient Account Number: [id number] Date of Birth/Sex: Treating RN: 06/17/1946 (77 y.o. [name] Primary Care Mariyana Fulop: [name] Other Clinician: Referring Kryssa Risenhoover: Treating Aidian Salomon/Extender: [name] in Treatment: 5 Vital Signs Height(in): 80 Pulse(bpm): 99 Weight(lbs): 190 Blood Pressure(mmHg): 121/68 Body Mass Index(BMI): 28.1 Temperature(F): 97.4 Respiratory Rate(breaths/min): 16 [4:Photos:] [N/A:N/A] Right Calcaneus Left Calcaneus N/A Wound Location: Pressure Injury Pressure Injury N/A Wounding Event: Pressure Ulcer Pressure Ulcer N/A Primary Etiology: Coronary Artery Disease, Coronary Artery Disease, N/A Comorbid History: Hypertension, Type I Diabetes, Hypertension, Type I Diabetes, Osteoarthritis, Received Osteoarthritis, Received Chemotherapy Chemotherapy 10/20/2022 10/20/2022 N/A Date Acquired: 5 5 N/A Weeks of Treatment: Open Open N/A Wound Status: No No N/A Wound Recurrence: 0.3x0.4x0.3 1.7x2.2x0.1 N/A Measurements L x W x D (cm) 0.094 2.937 N/A A (cm) : rea 0.028 0.294 N/A Volume (cm) : 98.00% -523.60% N/A % Reduction in A rea: 94.10% -525.50% N/A % Reduction in Volume: Category/Stage III Category/Stage III N/A Classification: Medium Medium N/A Exudate A mount: Serosanguineous Serosanguineous N/A Exudate Type: red, brown red, brown N/A Exudate Color: Fascia: No Fascia: No N/A Exposed Structures: Fat Layer (Subcutaneous Tissue): No Fat Layer (Subcutaneous Tissue): No Tendon: No Tendon: No Muscle: No Muscle: No Joint: No Joint: No Bone: No Bone: No Treatment  Notes Electronic Signature(s) Unsigned Entered By: [name] on 01/02/2023 10:20:22 Signature(s): [name] ([id number]) [id number] Date(s): [id number] Page 5 of 9 -------------------------------------------------------------------------------- Pain Assessment Details Patient Name: Date of Service: [name], [name] 01/02/2023 9:30 A M Medical Record Number: [id number] Patient Account Number: [id number] Date of Birth/Sex: Treating RN: 21-Sep-1946 (77 y.o. [name] Primary Care Coyt Govoni: [name] Other Clinician: Referring Analiese Krupka: Treating Desmin Daleo/Extender: [name] in Treatment: 5 Active Problems Location of Pain Severity and Description of Pain Patient Has Paino No Site Locations Pain Management and Medication Current Pain Management: Electronic Signature(s) Unsigned Entered By: [name] on 01/02/2023 09:56:13 -------------------------------------------------------------------------------- Patient/Caregiver Education Details Patient Name: Date of Service: [name], CLA UDE Lemmie Evens 2/22/2024andnbsp9:30 A M Medical Record Number: [id number] Patient Account Number: [id number] Date of Birth/Gender: Treating RN:  [birth year] (77 y.o. [name] Primary Care Physician: [name] Other Clinician: Referring Physician: Treating Physician/Extender: [name] in Treatment: [place], [name] ([id number]) [id number] Page 6 of 9 Education Assessment Education Provided To: Patient Education Topics Provided Wound/Skin Impairment: Handouts: Caring for Your Ulcer Methods: Explain/Verbal Responses: State content correctly Electronic Signature(s) Unsigned Entered By: [name] on 01/02/2023 10:26:45 -------------------------------------------------------------------------------- Wound Assessment Details Patient Name: Date of Service: PO  [name], [name] 01/02/2023 9:30 A M Medical Record Number: [id number] Patient Account Number: [id number] Date of Birth/Sex: Treating RN: [birth year] (77 y.o. [name] Primary Care Amauri Keefe: [name] Other Clinician: Referring Abrar Koone: Treating Dima Ferrufino/Extender: [name] in Treatment: 5 Wound Status Wound Number: 4 Primary Pressure Ulcer Etiology: Wound Location: Right Calcaneus Wound Open Wounding Event: Pressure Injury Status: Date Acquired: 10/20/2022 Comorbid Coronary Artery Disease, Hypertension, Type I Diabetes, Weeks Of Treatment: 5 History: Osteoarthritis, Received Chemotherapy Clustered Wound: No Photos Wound Measurements Length: (cm) 0.3 Width: (cm) 0.4 Depth: (cm) 0.3 Area: (cm) 0.094 Volume: (cm) 0.028 % Reduction in Area: 98% % Reduction in Volume: 94.1% Wound Description Suazo, Daron [name] ([id number]) Classification: Category/Stage III Exudate Amount: Medium Exudate Type: Serosanguineous Exudate Color: red, brown [id number] Page 7 of 9 Wound Bed Exposed Structure Fascia Exposed: No Fat Layer (Subcutaneous Tissue) Exposed: No Tendon Exposed: No Muscle Exposed: No Joint Exposed: No Bone Exposed: No Treatment Notes Wound #4 (Calcaneus) Wound Laterality: Right Cleanser Soap and Water Discharge Instruction: Gently cleanse wound with antibacterial soap, rinse and pat dry prior to dressing wounds Peri-Wound Care Topical Primary Dressing Hydrofera Blue Ready Transfer Foam, 2.5x2.5 (in/in) Discharge Instruction: Apply Hydrofera Blue Ready to wound bed as directed Secondary Dressing (BORDER) Zetuvit Plus SILICONE BORDER Dressing 4x4 (in/in) Discharge Instruction: Please do not put silicone bordered dressings under wraps. Use non-bordered dressing only. Secured With Compression Wrap Compression Stockings Add-Ons Electronic Signature(s) Unsigned Entered By: [name] on  01/02/2023 10:10:20 -------------------------------------------------------------------------------- Wound Assessment Details Patient Name: Date of Service: [name], [name] 01/02/2023 9:30 A M Medical Record Number: [id number] Patient Account Number: [id number] Date of Birth/Sex: Treating RN: 08-07-1946 (77 y.o. [name] Primary Care Brie Eppard: [name] Other Clinician: Referring Jahna Liebert: Treating Bernestine Holsapple/Extender: [name] in Treatment: 5 Wound Status Wound Number: 5 Primary Pressure Ulcer Etiology: Wound Location: Left Calcaneus Wound Open Wounding Event: Pressure Injury Status: Date Acquired: 10/20/2022 Comorbid Coronary Artery Disease, Hypertension, Type I Diabetes, Weeks Of Treatment: [place], Chosen [name] ([id number]) [id number] Page 8 of 9 Weeks Of Treatment: 5 History: Osteoarthritis, Received Chemotherapy Clustered Wound: No Photos Wound Measurements Length: (cm) 1.7 Width: (cm) 2.2 Depth: (cm) 0.1 Area: (cm) 2.937 Volume: (cm) 0.294 % Reduction in Area: -523.6% % Reduction in Volume: -525.5% Wound Description Classification: Exudate Amount: Exudate Type: Exudate Color: Category/Stage III Medium Serosanguineous red, brown Wound Bed Exposed Structure Fascia Exposed: No Fat Layer (Subcutaneous Tissue) Exposed: No Tendon Exposed: No Muscle Exposed: No Joint Exposed: No Bone Exposed: No Treatment Notes Wound #5 (Calcaneus) Wound Laterality: Left Cleanser Soap and Water Discharge Instruction: Gently cleanse wound with antibacterial soap, rinse and pat dry prior to dressing wounds Peri-Wound Care Topical Primary Dressing Hydrofera Blue Ready Transfer Foam, 2.5x2.5 (in/in) Discharge Instruction: Apply Hydrofera Blue Ready to wound bed as directed Secondary Dressing (BORDER) Zetuvit Plus SILICONE BORDER Dressing 4x4 (in/in) Discharge Instruction: Please do not put silicone  bordered dressings under wraps. Use non-bordered dressing only. Secured With Compression Wrap Compression Stockings Add-Ons Electronic  Signature(s) Unsigned Entered By: [name] on 01/02/2023 10:10:55 Signature(s): JOSECARLOS, WHALLEY ([id number]) [id number] Date(s): 90.pdf Page 9 of 9 -------------------------------------------------------------------------------- Vitals Details Patient Name: Date of Service: [name], [name] 01/02/2023 9:30 A M Medical Record Number: [id number] Patient Account Number: [id number] Date of Birth/Sex: Treating RN: [birth year] (77 y.o. [name] Primary Care Socrates Cahoon: [name] Other Clinician: Referring Viet Kemmerer: Treating Nataleigh Griffin/Extender: [name] in Treatment: 5 Vital Signs Time Taken: 09:53 Temperature (F): 97.4 Height (in): 69 Pulse (bpm): 99 Weight (lbs): 190 Respiratory Rate (breaths/min): 16 Body Mass Index (BMI): 28.1 Blood Pressure (mmHg): 121/68 Reference Range: 80 - 120 mg / dl Electronic Signature(s) Unsigned Entered By: [name] on 01/02/2023 09:56:07 Signature(s): Date(s):

## 2023-01-06 ENCOUNTER — Ambulatory Visit: Payer: Medicare HMO | Admitting: Cardiovascular Disease

## 2023-01-06 DIAGNOSIS — L89620 Pressure ulcer of left heel, unstageable: Secondary | ICD-10-CM | POA: Diagnosis not present

## 2023-01-06 DIAGNOSIS — E10621 Type 1 diabetes mellitus with foot ulcer: Secondary | ICD-10-CM | POA: Diagnosis not present

## 2023-01-06 DIAGNOSIS — L89610 Pressure ulcer of right heel, unstageable: Secondary | ICD-10-CM | POA: Diagnosis not present

## 2023-01-06 DIAGNOSIS — L98412 Non-pressure chronic ulcer of buttock with fat layer exposed: Secondary | ICD-10-CM | POA: Diagnosis not present

## 2023-01-10 ENCOUNTER — Encounter: Payer: Medicare HMO | Attending: Physician Assistant | Admitting: Physician Assistant

## 2023-01-10 DIAGNOSIS — Z7901 Long term (current) use of anticoagulants: Secondary | ICD-10-CM | POA: Diagnosis not present

## 2023-01-10 DIAGNOSIS — M720 Palmar fascial fibromatosis [Dupuytren]: Secondary | ICD-10-CM | POA: Insufficient documentation

## 2023-01-10 DIAGNOSIS — E10621 Type 1 diabetes mellitus with foot ulcer: Secondary | ICD-10-CM | POA: Diagnosis not present

## 2023-01-10 DIAGNOSIS — L98412 Non-pressure chronic ulcer of buttock with fat layer exposed: Secondary | ICD-10-CM | POA: Diagnosis not present

## 2023-01-10 DIAGNOSIS — L97422 Non-pressure chronic ulcer of left heel and midfoot with fat layer exposed: Secondary | ICD-10-CM | POA: Diagnosis not present

## 2023-01-10 DIAGNOSIS — E785 Hyperlipidemia, unspecified: Secondary | ICD-10-CM | POA: Insufficient documentation

## 2023-01-10 DIAGNOSIS — L89613 Pressure ulcer of right heel, stage 3: Secondary | ICD-10-CM | POA: Diagnosis not present

## 2023-01-10 DIAGNOSIS — L89623 Pressure ulcer of left heel, stage 3: Secondary | ICD-10-CM | POA: Insufficient documentation

## 2023-01-10 DIAGNOSIS — I110 Hypertensive heart disease with heart failure: Secondary | ICD-10-CM | POA: Diagnosis not present

## 2023-01-10 DIAGNOSIS — I252 Old myocardial infarction: Secondary | ICD-10-CM | POA: Insufficient documentation

## 2023-01-10 DIAGNOSIS — I2510 Atherosclerotic heart disease of native coronary artery without angina pectoris: Secondary | ICD-10-CM | POA: Insufficient documentation

## 2023-01-10 DIAGNOSIS — E1051 Type 1 diabetes mellitus with diabetic peripheral angiopathy without gangrene: Secondary | ICD-10-CM | POA: Insufficient documentation

## 2023-01-10 DIAGNOSIS — I5042 Chronic combined systolic (congestive) and diastolic (congestive) heart failure: Secondary | ICD-10-CM | POA: Diagnosis not present

## 2023-01-13 NOTE — Progress Notes (Signed)
LONALD, LUMPKIN ([id number]) [id number] Page 1 of 11 Visit Report for 01/10/2023 Arrival Information Details Patient Name: Date of Service: [name], [name] 01/10/2023 11:00 A M Medical Record Number: [id number] Patient Account Number: [id number] Date of Birth/Sex: Treating RN: September 11, 1946 (77 y.o. [name] Primary Care Cerissa Zeiger: [place] [name] Other Clinician: Referring Samayra Hebel: Treating Lauralyn Shadowens/Extender: [name] in Treatment: 6 Visit Information History Since Last Visit Added or deleted any medications: No Patient Arrived: Walker Has Dressing in Place as Prescribed: Yes Arrival Time: 11:08 Pain Present Now: No Accompanied By: partner Transfer Assistance: None Patient Identification Verified: Yes Secondary Verification Process Completed: Yes Patient Requires Transmission-Based Precautions: No Patient Has Alerts: Yes Patient Alerts: Type I Diabetic eliquis ABI/TBI normal see scan Electronic Signature(s) Signed: 01/10/2023 1:14:40 PM By: [name] Previous Signature: 01/10/2023 11:32:15 AM Version By: [name] Entered By: [name] on 01/10/2023 13:14:40 -------------------------------------------------------------------------------- Clinic Level of Care Assessment Details Patient Name: Date of Service: [name], [name] 01/10/2023 11:00 A M Medical Record Number: [id number] Patient Account Number: [id number] Date of Birth/Sex: Treating RN: [birth year] (77 y.o. [name] Primary Care [name]: [place] [name] Other Clinician: Referring [name]: Treating [name]/Extender: [name] in Treatment: 6 Clinic Level of Care Assessment Items TOOL 1 Quantity Score '[]'$  - 0 Use when EandM and Procedure is performed on INITIAL visit ASSESSMENTS - Nursing Assessment / Reassessment '[]'$  - 0 General Physical Exam (combine w/ comprehensive assessment  (listed just below) when performed on new pt. evals) '[]'$  - 0 Comprehensive Assessment (HX, ROS, Risk Assessments, Wounds Hx, etc.) ASSESSMENTS - Wound and Skin Assessment / Reassessment MARELL, HIRANI ([id number] Page 2 of 11 '[]'$  - 0 Dermatologic / Skin Assessment (not related to wound area) ASSESSMENTS - Ostomy and/or Continence Assessment and Care '[]'$  - 0 Incontinence Assessment and Management '[]'$  - 0 Ostomy Care Assessment and Management (repouching, etc.) PROCESS - Coordination of Care '[]'$  - 0 Simple Patient / Family Education for ongoing care '[]'$  - 0 Complex (extensive) Patient / Family Education for ongoing care '[]'$  - 0 Staff obtains Programmer, systems, Records, T Results / Process Orders est '[]'$  - 0 Staff telephones HHA, Nursing Homes / Clarify orders / etc '[]'$  - 0 Routine Transfer to another Facility (non-emergent condition) '[]'$  - 0 Routine Hospital Admission (non-emergent condition) '[]'$  - 0 New Admissions / Biomedical engineer / Ordering NPWT Apligraf, etc. , '[]'$  - 0 Emergency Hospital Admission (emergent condition) PROCESS - Special Needs '[]'$  - 0 Pediatric / Minor Patient Management '[]'$  - 0 Isolation Patient Management '[]'$  - 0 Hearing / Language / Visual special needs '[]'$  - 0 Assessment of Community assistance (transportation, D/C planning, etc.) '[]'$  - 0 Additional assistance / Altered mentation '[]'$  - 0 Support Surface(s) Assessment (bed, cushion, seat, etc.) INTERVENTIONS - Miscellaneous '[]'$  - 0 External ear exam '[]'$  - 0 Patient Transfer (multiple staff / Civil Service fast streamer / Similar devices) '[]'$  - 0 Simple Staple / Suture removal (25 or less) '[]'$  - 0 Complex Staple / Suture removal (26 or more) '[]'$  - 0 Hypo/Hyperglycemic Management (do not check if billed separately) '[]'$  - 0 Ankle / Brachial Index (ABI) - do not check if billed separately Has the patient been seen at the hospital within the last three years: Yes Total Score: 0 Level Of  Care: ____ Electronic Signature(s) Signed: 01/10/2023 2:53:23 PM By: [name] Entered By: [name] on 01/10/2023  13:17:05 -------------------------------------------------------------------------------- Encounter Discharge Information Details Patient Name: Date of Service: [name], [name] 01/10/2023 11:00 A M Medical Record Number: [id number] Patient Account Number: [id number] Date of Birth/Sex: Treating RN: 1946-02-22 (77 y.o. [name] Primary Care [name]: [place] [name] Other Clinician: Referring [name]: Treating [name]/Extender: [name] in Treatment: [name], [name] Novel ([id number]) [id number] Page 3 of 11 Encounter Discharge Information Items Post Procedure Vitals Discharge Condition: Stable Temperature (F): 97.3 Ambulatory Status: Walker Pulse (bpm): 93 Discharge Destination: Home Respiratory Rate (breaths/min): 16 Transportation: Private Auto Blood Pressure (mmHg): 110/62 Accompanied By: 3 partner Schedule Follow-up Appointment: Yes Clinical Summary of Care: Electronic Signature(s) Signed: 01/10/2023 1:28:49 PM By: [name] Entered By: [name] on 01/10/2023 13:28:49 -------------------------------------------------------------------------------- Lower Extremity Assessment Details Patient Name: Date of Service: [name], [name] 01/10/2023 11:00 A M Medical Record Number: [id number] Patient Account Number: [id number] Date of Birth/Sex: Treating RN: July 22, 1946 (77 y.o. [name] Primary Care [name]: [place] [name] Other Clinician: Referring [name]: Treating [name]/Extender: [name] in Treatment: 6 Edema Assessment Assessed: [name]: No] [Right: No] [Left: Edema] [Right: :] Calf Left: Right: Point of Measurement: 34 cm From Medial Instep 33 cm 33 cm Ankle Left: Right: Point of Measurement: 10 cm From Medial  Instep 23 cm 23 cm Vascular Assessment Pulses: Dorsalis Pedis Palpable: [Left:Yes] [Right:Yes] Electronic Signature(s) Signed: 01/10/2023 1:16:17 PM By: [name] Entered By: [name] on 01/10/2023 13:16:16 Bacliff, [name] Novel ([id number] Page 4 of 11 -------------------------------------------------------------------------------- Multi Wound Chart Details Patient Name: Date of Service: [name], [name] 01/10/2023 11:00 A M Medical Record Number: [id number] Patient Account Number: [id number] Date of Birth/Sex: Treating RN: Apr 02, 1946 (77 y.o. [name] Primary Care Chayden Garrelts: [place] [name] Other Clinician: Referring Shontae Rosiles: Treating Monesha Monreal/Extender: [name] in Treatment: 6 Vital Signs Height(in): 32 Pulse(bpm): 13 Weight(lbs): 190 Blood Pressure(mmHg): 110/62 Body Mass Index(BMI): 28.1 Temperature(F): 97.3 Respiratory Rate(breaths/min): 16 [4:Photos:] [N/A:N/A] Right Calcaneus Left Calcaneus N/A Wound Location: Pressure Injury Pressure Injury N/A Wounding Event: Pressure Ulcer Pressure Ulcer N/A Primary Etiology: Coronary Artery Disease, Coronary Artery Disease, N/A Comorbid History: Hypertension, Type I Diabetes, Hypertension, Type I Diabetes, Osteoarthritis, Received Osteoarthritis, Received Chemotherapy Chemotherapy 10/20/2022 10/20/2022 N/A Date Acquired: 6 6 N/A Weeks of Treatment: Healed - Epithelialized Open N/A Wound Status: No No N/A Wound Recurrence: 0x0x0 1.5x1.8x0.2 N/A Measurements L x W x D (cm) 0 2.121 N/A A (cm) : rea 0 0.424 N/A Volume (cm) : 100.00% -350.30% N/A % Reduction in A rea: 100.00% -802.10% N/A % Reduction in Volume: Category/Stage III Category/Stage III N/A Classification: Medium Medium N/A Exudate A mount: Serosanguineous Serosanguineous N/A Exudate Type: red, brown red, brown N/A Exudate Color: Fascia: No Fascia: No  N/A Exposed Structures: Fat Layer (Subcutaneous Tissue): No Fat Layer (Subcutaneous Tissue): No Tendon: No Tendon: No Muscle: No Muscle: No Joint: No Joint: No Bone: No Bone: No N/A Debridement - Excisional N/A Debridement: Pre-procedure Verification/Time Out N/A 11:36 N/A Taken: N/A Lidocaine 4% T opical Solution N/A Pain Control: N/A Subcutaneous, Slough N/A Tissue Debrided: N/A Skin/Subcutaneous Tissue N/A Level: N/A 2.7 N/A Debridement A (sq cm): rea N/A Curette N/A Instrument: N/A Moderate N/A Bleeding: N/A Silver Nitrate N/A Hemostasis A chieved: N/A Procedure was tolerated well N/A Debridement Treatment Response: N/A 1.5x1.8x0.3 N/A Post Debridement Measurements L x W x D (cm) N/A 0.636 N/A Post Debridement Volume: (cm) N/A Category/Stage III N/A Post Debridement  Stage: N/A Debridement N/A Procedures Performed: Treatment Notes Wound #4 (Calcaneus) Wound Laterality: Right Cleanser Peri-Wound Care Topical JAVY, TIBBITS ([id number] Page 5 of 11 Primary Dressing Secondary Dressing Secured With Compression Wrap Compression Stockings Add-Ons Wound #5 (Calcaneus) Wound Laterality: Left Cleanser Soap and Water Discharge Instruction: Gently cleanse wound with antibacterial soap, rinse and pat dry prior to dressing wounds Peri-Wound Care Topical Primary Dressing Hydrofera Blue Ready Transfer Foam, 2.5x2.5 (in/in) Discharge Instruction: Apply Hydrofera Blue Ready to wound bed as directed Secondary Dressing (BORDER) Zetuvit Plus SILICONE BORDER Dressing 4x4 (in/in) Discharge Instruction: Please do not put silicone bordered dressings under wraps. Use non-bordered dressing only. Secured With Compression Wrap Compression Stockings Environmental education officer) Signed: 01/10/2023 1:16:26 PM By: [name] Entered By: [name] on 01/10/2023  13:16:26 -------------------------------------------------------------------------------- Multi-Disciplinary Care Plan Details Patient Name: Date of Service: [name], [name] 01/10/2023 11:00 A M Medical Record Number: [id number] Patient Account Number: [id number] Date of Birth/Sex: Treating RN: 1946-02-06 (77 y.o. [name] Primary Care Ledonna Dormer: [place] [name] Other Clinician: Referring Preslea Rhodus: Treating Lamyra Malcolm/Extender: [name] in Treatment: 6 Active Inactive Necrotic Tissue Nursing Diagnoses: Knowledge deficit related to management of necrotic/devitalized tissue Goals: Patient/caregiver will verbalize understanding of reason and process for debridement of necrotic tissue Date Initiated: 11/26/2022 Target Resolution Date: 12/03/2022 GURTAJ, ALMEIDA ([id number]) [phone].pdf Page 6 of 11 Goal Status: Active Interventions: Assess patient pain level pre-, during and post procedure and prior to discharge Notes: Pressure Nursing Diagnoses: Potential for impaired tissue integrity related to pressure, friction, moisture, and shear Goals: Patient will remain free of pressure ulcers Date Initiated: 11/26/2022 Target Resolution Date: 12/24/2022 Goal Status: Active Interventions: Assess: immobility, friction, shearing, incontinence upon admission and as needed Assess offloading mechanisms upon admission and as needed Assess potential for pressure ulcer upon admission and as needed Notes: Wound/Skin Impairment Nursing Diagnoses: Knowledge deficit related to ulceration/compromised skin integrity Goals: Patient/caregiver will verbalize understanding of skin care regimen Date Initiated: 11/26/2022 Target Resolution Date: 12/03/2022 Goal Status: Active Ulcer/skin breakdown will have a volume reduction of 30% by week 4 Date Initiated: 11/26/2022 Target Resolution Date: 12/24/2022 Goal Status: Active Ulcer/skin breakdown  will have a volume reduction of 50% by week 8 Date Initiated: 11/26/2022 Target Resolution Date: 01/21/2023 Goal Status: Active Ulcer/skin breakdown will have a volume reduction of 80% by week 12 Date Initiated: 11/26/2022 Target Resolution Date: 02/18/2023 Goal Status: Active Ulcer/skin breakdown will heal within 14 weeks Date Initiated: 11/26/2022 Target Resolution Date: 03/18/2023 Goal Status: Active Interventions: Assess patient/caregiver ability to obtain necessary supplies Assess patient/caregiver ability to perform ulcer/skin care regimen upon admission and as needed Assess ulceration(s) every visit Notes: Electronic Signature(s) Signed: 01/10/2023 1:18:54 PM By: [name] Entered By: [name] on 01/10/2023 13:18:54 -------------------------------------------------------------------------------- Pain Assessment Details Patient Name: Date of Service: [name], [name] 01/10/2023 11:00 A M Medical Record Number: [id number] Patient Account Number: [id number] Date of Birth/Sex: Treating RN: 08-22-1946 (77 y.o. [name] Primary Care Willman Cuny: [place] [name] Other Clinician: CHANDLAR, COBBLE ([id number]) [id number] Page 7 of 11 Referring Nahara Dona: Treating Takya Vandivier/Extender: [name] in Treatment: 6 Active Problems Location of Pain Severity and Description of Pain Patient Has Paino No Site Locations Pain Management and Medication Current Pain Management: Electronic Signature(s) Signed: 01/10/2023 1:14:55 PM By: [name] Entered By: [name] on 01/10/2023 13:14:55 -------------------------------------------------------------------------------- Patient/Caregiver Education Details Patient Name: Date of Service: [name]  [name], [name] 3/1/2024andnbsp11:00 A M Medical Record Number: [id number] Patient Account Number: [id number] Date of Birth/Gender: Treating RN: 24-Jun-1946 (77  y.o. [name] Primary Care Physician: [place] [name] Other Clinician: Referring Physician: Treating Physician/Extender: [name] in Treatment: 6 Education Assessment Education Provided To: Patient Education Topics Provided Wound/Skin Impairment: Handouts: Caring for Your Ulcer Methods: Explain/Verbal Responses: State content correctly Electronic Signature(s) Signed: 01/10/2023 2:53:23 PM By: [name] Entered By: [name] on 01/10/2023 13:18:59 Kopko, [name] Novel ([id number] Page 8 of 11 -------------------------------------------------------------------------------- Wound Assessment Details Patient Name: Date of Service: [name], [name] 01/10/2023 11:00 A M Medical Record Number: [id number] Patient Account Number: [id number] Date of Birth/Sex: Treating RN: 11-29-1945 (77 y.o. [name] Primary Care Hajira Verhagen: [place] [name] Other Clinician: Referring Harpreet Pompey: Treating Shamela Haydon/Extender: [name] in Treatment: 6 Wound Status Wound Number: 4 Primary Pressure Ulcer Etiology: Wound Location: Right Calcaneus Wound Healed - Epithelialized Wounding Event: Pressure Injury Status: Date Acquired: 10/20/2022 Comorbid Coronary Artery Disease, Hypertension, Type I Diabetes, Weeks Of Treatment: 6 History: Osteoarthritis, Received Chemotherapy Clustered Wound: No Photos Wound Measurements Length: (cm) 0 Width: (cm) 0 Depth: (cm) 0 Area: (cm) 0 Volume: (cm) 0 % Reduction in Area: 100% % Reduction in Volume: 100% Wound Description Classification: Category/Stage III Exudate Amount: Medium Exudate Type: Serosanguineous Exudate Color: red, brown Wound Bed Exposed Structure Fascia Exposed: No Fat Layer (Subcutaneous Tissue) Exposed: No Tendon Exposed: No Muscle Exposed: No Joint Exposed: No Bone Exposed: No Treatment Notes Wound #4 (Calcaneus)  Wound Laterality: Right [place], [place] ([id number] Page 9 of 11 Topical Primary Dressing Secondary Dressing Secured With Compression Wrap Compression Stockings Add-Ons Electronic Signature(s) Signed: 01/10/2023 2:53:23 PM By: [name] Entered By: [name] on 01/10/2023 11:36:03 -------------------------------------------------------------------------------- Wound Assessment Details Patient Name: Date of Service: [name], [name] 01/10/2023 11:00 A M Medical Record Number: [id number] Patient Account Number: [id number] Date of Birth/Sex: Treating RN: [birth year] (77 y.o. [name] Primary Care Ahmir Bracken: [place] [name] Other Clinician: Referring Lyman Balingit: Treating Kynnedy Carreno/Extender: [name] in Treatment: 6 Wound Status Wound Number: 5 Primary Pressure Ulcer Etiology: Wound Location: Left Calcaneus Wound Open Wounding Event: Pressure Injury Status: Date Acquired: 10/20/2022 Comorbid Coronary Artery Disease, Hypertension, Type I Diabetes, Weeks Of Treatment: 6 History: Osteoarthritis, Received Chemotherapy Clustered Wound: No Photos Wound Measurements Length: (cm) 1.5 Width: (cm) 1.8 Depth: (cm) 0.2 Area: (cm) 2.121 Volume: (cm) 0.424 % Reduction in Area: -350.3% % Reduction in Volume: -802.1% Wound Description Classification: Category/Stage III Exudate Amount: Medium Exudate Type: Serosanguineous Exudate Color: red, brown Hirsch, Angela H ([id number]) Wound Bed [id number] Page 10 of 11 Exposed Structure Fascia Exposed: No Fat Layer (Subcutaneous Tissue) Exposed: No Tendon Exposed: No Muscle Exposed: No Joint Exposed: No Bone Exposed: No Treatment Notes Wound #5 (Calcaneus) Wound Laterality: Left Cleanser Soap and Water Discharge Instruction: Gently cleanse wound with antibacterial soap, rinse and pat  dry prior to dressing wounds Peri-Wound Care Topical Primary Dressing Hydrofera Blue Ready Transfer Foam, 2.5x2.5 (in/in) Discharge Instruction: Apply Hydrofera Blue Ready to wound bed as directed Secondary Dressing (BORDER) Zetuvit Plus SILICONE BORDER Dressing 4x4 (in/in) Discharge Instruction: Please do not put silicone bordered dressings under wraps. Use non-bordered dressing only. Secured With Compression Wrap Compression Stockings Environmental education officer) Signed: 01/10/2023 2:53:23 PM By: [name] Entered By: [name] on 01/10/2023 11:27:42 -------------------------------------------------------------------------------- Vitals Details  Patient Name: Date of Service: [name], [name] 01/10/2023 11:00 A M Medical Record Number: [id number] Patient Account Number: [id number] Date of Birth/Sex: Treating RN: 1946/07/12 (77 y.o. [name] Primary Care Frederick Marro: [place] [name] Other Clinician: Referring Remington Skalsky: Treating Coye Dawood/Extender: [name] in Treatment: 6 Vital Signs Time Taken: 11:09 Temperature (F): 97.3 Height (in): 69 Pulse (bpm): 93 Weight (lbs): 190 Respiratory Rate (breaths/min): 16 Body Mass Index (BMI): 28.1 Blood Pressure (mmHg): 110/62 Reference Range: 80 - 120 mg / dl Electronic Signature(s) Signed: 01/10/2023 1:14:49 PM By: [name] Entered By: [name] on 01/10/2023 13:14:49 Bowmer, [name] Novel ([id number] Page 11 of 11

## 2023-01-13 NOTE — Progress Notes (Addendum)
QUANTAVIOUS, SCHILLO ([id number]) [id number] Page 1 of 9 Visit Report for 01/10/2023 Chief Complaint Document Details Patient Name: Date of Service: [name], [name] 01/10/2023 11:00 A M Medical Record Number: [id number] Patient Account Number: [id number] Date of Birth/Sex: Treating RN: 1946/04/14 (77 y.o. [name] Provider: [name] Other Clinician: Referring Provider: Treating Provider/Extender: [name] in Treatment: 6 Information Obtained from: Patient Chief Complaint Bilateral heel pressure ulcers and sacral pressure ulcer Electronic Signature(s) Signed: 01/10/2023 1:46:09 PM By: [name] Signed: 01/10/2023 2:53:23 PM By: [name] Previous Signature: 01/10/2023 11:32:01 AM Version By: [name] Entered By: [name] on 01/10/2023 11:40:09 -------------------------------------------------------------------------------- Debridement Details Patient Name: Date of Service: [name], [name] 01/10/2023 11:00 A M Medical Record Number: [id number] Patient Account Number: [id number] Date of Birth/Sex: Treating RN: 20-May-1946 (77 y.o. [name] Provider: [name] Other Clinician: Referring Provider: Treating Provider/Extender: [name] in Treatment: 6 Debridement Performed for Assessment: Wound #5 Left Calcaneus Performed By: Physician [name], [name] Debridement Type: Debridement Level of Consciousness (Pre-procedure): Awake and Alert Pre-procedure Verification/Time Out Yes - 11:36 Taken: Start Time: 11:36 Pain Control: Lidocaine 4% T opical Solution T Area Debrided (L x W): otal 1.5 (cm) x 1.8 (cm) = 2.7 (cm) Tissue and other material debrided: Viable, Non-Viable, Slough, Subcutaneous, Slough Level: Skin/Subcutaneous Tissue Debridement Description: Excisional Instrument: Curette Bleeding:  Moderate Hemostasis Achieved: Silver Nitrate Response to Treatment: Procedure was tolerated well Level of Consciousness (Post- Awake and Alert [name], [name] ([id number]) [id number] Page 2 of 9 Awake and Alert procedure): Post Debridement Measurements of Total Wound Length: (cm) 1.5 Stage: Category/Stage III Width: (cm) 1.8 Depth: (cm) 0.3 Volume: (cm) 0.636 Character of Wound/Ulcer Post Debridement: Stable Post Procedure Diagnosis Same as Pre-procedure Electronic Signature(s) Signed: 01/10/2023 1:46:09 PM By: [name] Signed: 01/10/2023 2:53:23 PM By: [name] Entered By: [name] on 01/10/2023 11:39:31 -------------------------------------------------------------------------------- HPI Details Patient Name: Date of Service: [name], [name] 01/10/2023 11:00 A M Medical Record Number: [id number] Patient Account Number: [id number] Date of Birth/Sex: Treating RN: [birth year] (77 y.o. [name] Provider: [name] Other Clinician: Referring Provider: Treating Provider/Extender: [name] in Treatment: 6 History of Present Illness Location: right anterior shin Quality: Patient reports No Pain. Severity: Patient states wound are getting worse. Duration: Patient has had the wound for > 6 months prior to seeking treatment at the wound center Context: The wound appeared gradually over time Modifying Factors: Other treatment(s) tried include: taken doxycycline and use bacitracin ssociated Signs and Symptoms: Patient reports presence of swelling A HPI Description: 77 year old male recently seen by his PCP Dr. [name] who saw him for a wound on the right leg which is less tender and he is continuing to use bacitracin and has completed her course of doxycycline. Past medical history of coronary artery disease, diabetes mellitus type 1, hyperlipidemia,  hypertension, MI, plantar fascial fibromatosis, pneumonia, status post foot surgery due to trauma on the left side, coronary angioplasty. he is a former smoker and quit in July 1996. last hemoglobin A1c was 7.8 % in April of this year 5/22 2017 -- biopsy of the right lower extremity was sent for pathology and the report is that of a basal cell carcinoma with edges of the biopsy are involved. I have called the patient over the  phone( 03/28/16) and discussed the pathology report with him and he is agreeable to see a surgeon for excision and need full. I have also spoken personally to Dr. [name], of [facility] surgical and referred the patient for further wide excision of this lesion and have communicated this to the patient. READMISSION 08/12/18 This is a now 77 year old man with type 1 diabetes. Hemoglobin A1c on 4/29 was 7.9. He is listed as having a history of PAD in the [facility] records although the patient doesn't recall this, doesn't complain of any symptoms of claudication and doesn't believe he has had any prior noninvasive arterial test. He is a nonsmoker. He was here in 2017 with a wound on his Right lower leg. This was biopsied in the clinic and pathology showed a basal cell carcinoma. He went on to have surgery on this area this is currently where I believe his current wounds are located. He generally bumped the lateral part of his right calf 2 weeks ago and has a superficial abrasion. He also has 2 small open areas on the medial part of the tibia in the same area. Our intake nurse noted a stitch coming out of this which was removed. These of the wound sees actually come to the clinic for. However the more concerning area is an area on the tip of the left great toe. He says that this was initially traumatic and he's been to see Dr. [name] podiatrist. Darral Dash been using what I think is Santyl to the wound tip. This has some depth. ABIs in this clinic were non-obtainable on the right and 1.84 on  the left. 08/19/18; the patient had arterial studies that did not show evidence of significant hemodynamically compromising occlusions in either leg. There was mild medial atherosclerosis bilaterally. His resting ABIs were within the normal limits at 1.3 on the right and 1.4 on the left. There was normal posterior and anterior tibial artery waveforms and velocities listed on both sides. The patient arrives with what appears to be a healthy surface over the tip of his left great toe. This is indeed surprising. Still looks somewhat friable however. [name], [name] ([id number]) [id number] Page 3 of 9 More concerning is the x-ray that we did that showed erosions of the tuft of the distal phalanx the left great toe consistent with osteomyelitis. I attempted to pull this x-ray up on colon health Link however I can't open the film to look at this myself. 08/26/18; I reviewed the patient's x-ray and colon helpline. Indeed there is fairly obvious erosion of the tip of his left great toe. The wound was initially a traumatic area hitting it sometime in the middle of night in his kitchen. He is a type I diabetic. I have him on Flagyl and Levaquin that I started last week i.e. 7 days ago. He has an appointment with infectious disease on 09/01/18 09/02/18; the patient was seen by Dr. Delaine Lame of infectious disease. She did not feel the patient needed IV antibiotics. I do feel he needs further oral antibiotics however. He does not need anymore Flagyl but he does need another 2 weeks of Levaquin. The areas just about closed. 09/16/18; the patient is completing his Levaquin today. There is no open wound on the tip of the toe Readmission: 11-26-2022 upon evaluation today patient presents for initial inspection here in our clinic concerning issues that he has been having after having been hospitalized due to septic arthritis. Subsequently he has a PICC line that is been removed  and he  has been discharged from infectious disease in that regard. Unfortunately he developed shearing wounds over the gluteal region as well as a pressure ulcer over each heel although they are not looking extremely bad nonetheless they do seem to be evidence of pressure which she sustained while hospitalized. Obviously he was very sick and is still recovering as far as that is concerned. Fortunately there does not appear to be any evidence of active infection systemically which is good news at this point according to Dr. Ardyth Gal. Patient's wounds also do not appear to be too bad at this point which is good news. Patient does have a history of type 1 diabetes mellitus, peripheral vascular disease, coronary artery disease, congestive heart failure, hypertension, and he is on long-term anticoagulant therapy. 12-03-2022 upon evaluation today patient appears to be doing better to some degree in regard to his ulcers on his heels at this point. We are still waiting on the actual appointment with the vascular surgeons but nonetheless in the meantime I do believe that he is making some good progress currently with regard to loosen up the eschar. I did actually feel like that there was some ability for [place] to remove some of the necrotic tissue today and I discussed that with the patient. He is in agreement with that plan. We also can probably see about making an adjustment in the treatment regimen. 12-10-2022 upon evaluation today patient's wounds were really doing about the same. Again he is still awaiting the evaluation with the vascular surgeon. That should have already been done but had to be pushed back unfortunately. He will be seeing them next week on the seventh. I plan to see him following somewhere around the end of the week I think will probably be best. The patient voiced understanding. 12-19-2022 upon evaluation today patient appears to be doing well currently in regard to his wound we did get the results  of his arterial studies which show that he has excellent ABIs with no signs of vascular compromise. For that reason I will go ahead and perform some debridement today to clearway the necrotic debris with his Eliquis that I will be too aggressive so this will still be a stepwise process but we are to go ahead and get that started. 12-27-2022 upon evaluation today patient appears to be doing well currently in regard to his wounds. They are actually showing signs of excellent improvement. There is some necrotic tissue noted on the surface of wounds to work on clearing some of that away today also I think that we may switch to [facility] dressing to see how things do going forward. He is in agreement with that plan. Subsequently I am extremely pleased with where we stand currently. No fevers, chills, nausea, vomiting, or diarrhea. 01-02-2023 upon evaluation today patient appears to be doing much better in regard to his wounds the right foot appears to be almost healed the left foot though not completely healed is significantly better. Fortunately there does not appear to be any signs of active infection at this point. 01-10-2023 upon evaluation today patient appears to be doing well currently in regard to his wounds. The right heel actually is completely healed the left heel is definitely headed in the right direction and looks to be doing awesome. I do not see any signs of active infection locally nor systemically at this time which is great news. Electronic Signature(s) Signed: 01/10/2023 12:00:10 PM By: [name] Entered By: Melburn Hake,  Kylah Maresh on 01/10/2023 12:00:10 -------------------------------------------------------------------------------- Physical Exam Details Patient Name: Date of Service: [name], [name] 01/10/2023 11:00 A M Medical Record Number: [id number] Patient Account Number: [id number] Date of Birth/Sex: Treating RN: Jan 22, 1946 (77 y.o. [name]  Provider: [name] Other Clinician: Referring Provider: Treating Provider/Extender: [name] in Treatment: 6 Constitutional Well-nourished and well-hydrated in no acute distress. Respiratory normal breathing without difficulty. Psychiatric this patient is able to make decisions and demonstrates good insight into disease process. Alert and Oriented x 3. pleasant and cooperative. [name], [name] ([id number]) [id number] Page 4 of 9 Notes Upon inspection patient's wound bed actually showed signs of good granulation epithelization at this point. Fortunately I do not see any evidence of infection locally nor systemically which is great news and in general I think that he is doing excellent the left heel did require debridement I did use a little bit of silver nitrate to achieve hemostasis otherwise the right heel is completely healed. Electronic Signature(s) Signed: 01/10/2023 12:00:32 PM By: [name] Entered By: Worthy Keeler on 01/10/2023 12:00:31 -------------------------------------------------------------------------------- Physician Orders Details Patient Name: Date of Service: [name], [name] 01/10/2023 11:00 A M Medical Record Number: [id number] Patient Account Number: [id number] Date of Birth/Sex: Treating RN: Aug 22, 1946 (77 y.o. 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 [place] for wound care. May utilize formulary equivalent dressing for wound treatment orders unless otherwise specified. Home Health Nurse may visit PRN to address patients wound care needs. Alvis Lemmings fax [phone] [facility] Nurse. PLEASE change dressing on the left heel when you visit unless partner has already changed dressing. Apply Hydrofera blue and cover with a bordered foam dressing. Reapply compression stockings Bathing/ Shower/ Hygiene May shower; gently cleanse wound with antibacterial soap, rinse and pat dry prior to dressing wounds Anesthetic (Use 'Patient Medications' Section for Anesthetic Order Entry) Lidocaine applied to wound bed Edema Control - Lymphedema / Segmental Compressive Device / Other Elevate, Exercise Daily and A void Standing for Long Periods of Time. Elevate legs to the level of the heart and pump ankles as often as possible Elevate leg(s) parallel to the floor when sitting. Wound Treatment Wound #5 - Calcaneus Wound Laterality: Left Cleanser: Soap and Water 1 x Per Day/30 Days Discharge Instructions: Gently cleanse wound with antibacterial soap, rinse and pat dry prior to dressing wounds Prim Dressing: Hydrofera Blue Ready Transfer Foam, 2.5x2.5 (in/in) (Dispense As Written) 1 x Per Day/30 Days ary Discharge Instructions: Apply Hydrofera Blue Ready to wound bed as directed Secondary Dressing: (BORDER) Zetuvit Plus SILICONE BORDER Dressing 4x4 (in/in) (Generic) 1 x Per Day/30 Days [name], [name] ([id number] Page 5 of 9 Discharge Instructions: Please do not put silicone bordered dressings under wraps. Use non-bordered dressing only. Electronic Signature(s) Signed: 01/10/2023 1:46:09 PM By: Worthy Keeler  [name] Signed: 01/10/2023 2:53:23 PM By: [name] MSN  RN CNS WTA Entered By: [name] on 01/10/2023 13:23:34 -------------------------------------------------------------------------------- Problem List Details Patient Name: Date of Service: [name], [name] 01/10/2023 11:00 A M Medical Record Number: [id number] Patient Account Number: [id number] Date of Birth/Sex: Treating RN: 1946/04/30 (77 y.o. [name] Provider: [name] Other Clinician: Referring Provider: Treating Provider/Extender: [name] in Treatment: 6 Active Problems ICD-10 Encounter Code Description Active Date MDM Diagnosis E10.621 Type 1 diabetes mellitus with foot ulcer 11/26/2022 No Yes I73.89 Other specified peripheral vascular diseases 11/26/2022 No Yes L89.613 Pressure ulcer of right heel, stage 3 11/26/2022 No Yes L89.623 Pressure ulcer of left heel, stage 3 11/26/2022 No Yes L98.412 Non-pressure chronic ulcer of buttock with fat layer exposed 11/26/2022 No Yes I25.10 Atherosclerotic heart disease of native coronary artery without angina pectoris 11/26/2022 No Yes I50.42 Chronic combined systolic (congestive) and diastolic (congestive) heart failure 11/26/2022 No Yes I10 Essential (primary) hypertension 11/26/2022 No Yes Inactive Problems Resolved Problems [name], [name] ([id number] Page 6 of 9 Electronic Signature(s) Signed: 01/10/2023 1:46:09 PM By: [name] Signed: 01/10/2023 2:53:23 PM By: [name] Previous Signature: 01/10/2023 11:31:57 AM Version By: [name] Entered By: [name] on 01/10/2023 11:40:05 -------------------------------------------------------------------------------- Progress Note Details Patient Name: Date of Service: [name], [name] 01/10/2023 11:00 A M Medical Record Number: [id number] Patient Account Number: [id number] Date of Birth/Sex:  Treating RN: 02/28/1946 (76 y.o. [name] Provider: [name] Other Clinician: Referring Provider: Treating Provider/Extender: [name] in Treatment: 6 Subjective Chief Complaint Information obtained from Patient Bilateral heel pressure ulcers and sacral pressure ulcer History of Present Illness (HPI) The following HPI elements were documented for the patient's wound: Location: right anterior shin Quality: Patient reports No Pain. Severity: Patient states wound are getting worse. Duration: Patient has had the wound for > 6 months prior to seeking treatment at the wound center Context: The wound appeared gradually over time Modifying Factors: Other treatment(s) tried include: taken doxycycline and use bacitracin Associated Signs and Symptoms: Patient reports presence of swelling 77 year old male recently seen by his PCP Dr. [name] who saw him for a wound on the right leg which is less tender and he is continuing to use bacitracin and has completed her course of doxycycline. Past medical history of coronary artery disease, diabetes mellitus type 1, hyperlipidemia, hypertension, MI, plantar fascial fibromatosis, pneumonia, status post foot surgery due to trauma on the left side, coronary angioplasty. he is a former smoker and quit in July 1996. last hemoglobin A1c was 7.8 % in April of this year 5/22 2017 -- biopsy of the right lower extremity was sent for pathology and the report is that of a basal cell carcinoma with edges of the biopsy are involved. I have called the patient over the phone( 03/28/16) and discussed the pathology report with him and he is agreeable to see a surgeon for excision and need full. I have also spoken personally to Dr. [name], of [facility] surgical and referred the patient for further wide excision of this lesion and have communicated this to the patient. READMISSION 08/12/18 This is a now 77 year old man with type  1 diabetes. Hemoglobin A1c on 4/29 was 7.9. He is listed as having a history of PAD in the [facility] records although the patient doesn't recall this, doesn't complain of any symptoms of claudication and doesn't believe he has had any prior noninvasive arterial  test. He is a nonsmoker. He was here in 2017 with a wound on his Right lower leg. This was biopsied in the clinic and pathology showed a basal cell carcinoma. He went on to have surgery on this area this is currently where I believe his current wounds are located. He generally bumped the lateral part of his right calf 2 weeks ago and has a superficial abrasion. He also has 2 small open areas on the medial part of the tibia in the same area. Our intake nurse noted a stitch coming out of this which was removed. These of the wound sees actually come to the clinic for. However the more concerning area is an area on the tip of the left great toe. He says that this was initially traumatic and he's been to see Dr. [name] podiatrist. Darral Dash been using what I think is Santyl to the wound tip. This has some depth. ABIs in this clinic were non-obtainable on the right and 1.84 on the left. 08/19/18; the patient had arterial studies that did not show evidence of significant hemodynamically compromising occlusions in either leg. There was mild medial atherosclerosis bilaterally. His resting ABIs were within the normal limits at 1.3 on the right and 1.4 on the left. There was normal posterior and anterior tibial artery waveforms and velocities listed on both sides. The patient arrives with what appears to be a healthy surface over the tip of his left great toe. This is indeed surprising. Still looks somewhat friable however. More concerning is the x-ray that we did that showed erosions of the tuft of the distal phalanx the left great toe consistent with osteomyelitis. I attempted to pull this x-ray up on colon health Link however I can't open the film to  look at this myself. 08/26/18; I reviewed the patient's x-ray and colon helpline. Indeed there is fairly obvious erosion of the tip of his left great toe. The wound was initially a traumatic area hitting it sometime in the middle of night in his kitchen. He is a type I diabetic. I have him on Flagyl and Levaquin that I started last week i.e. 7 days ago. He has an appointment with infectious disease on 09/01/18 09/02/18; the patient was seen by Dr. Delaine Lame of infectious disease. She did not feel the patient needed IV antibiotics. I do feel he needs further oral antibiotics however. He does not need anymore Flagyl but he does need another 2 weeks of Levaquin. The areas just about closed. 09/16/18; the patient is completing his Levaquin today. There is no open wound on the tip of the toe [name], [name] H ([id number]) [id number] Page 7 of 9 Readmission: 11-26-2022 upon evaluation today patient presents for initial inspection here in our clinic concerning issues that he has been having after having been hospitalized due to septic arthritis. Subsequently he has a PICC line that is been removed and he has been discharged from infectious disease in that regard. Unfortunately he developed shearing wounds over the gluteal region as well as a pressure ulcer over each heel although they are not looking extremely bad nonetheless they do seem to be evidence of pressure which she sustained while hospitalized. Obviously he was very sick and is still recovering as far as that is concerned. Fortunately there does not appear to be any evidence of active infection systemically which is good news at this point according to Dr. Ardyth Gal. Patient's wounds also do not appear to be too bad at this point which is good news.  Patient does have a history of type 1 diabetes mellitus, peripheral vascular disease, coronary artery disease, congestive heart failure, hypertension, and he is on  long-term anticoagulant therapy. 12-03-2022 upon evaluation today patient appears to be doing better to some degree in regard to his ulcers on his heels at this point. We are still waiting on the actual appointment with the vascular surgeons but nonetheless in the meantime I do believe that he is making some good progress currently with regard to loosen up the eschar. I did actually feel like that there was some ability for [place] to remove some of the necrotic tissue today and I discussed that with the patient. He is in agreement with that plan. We also can probably see about making an adjustment in the treatment regimen. 12-10-2022 upon evaluation today patient's wounds were really doing about the same. Again he is still awaiting the evaluation with the vascular surgeon. That should have already been done but had to be pushed back unfortunately. He will be seeing them next week on the seventh. I plan to see him following somewhere around the end of the week I think will probably be best. The patient voiced understanding. 12-19-2022 upon evaluation today patient appears to be doing well currently in regard to his wound we did get the results of his arterial studies which show that he has excellent ABIs with no signs of vascular compromise. For that reason I will go ahead and perform some debridement today to clearway the necrotic debris with his Eliquis that I will be too aggressive so this will still be a stepwise process but we are to go ahead and get that started. 12-27-2022 upon evaluation today patient appears to be doing well currently in regard to his wounds. They are actually showing signs of excellent improvement. There is some necrotic tissue noted on the surface of wounds to work on clearing some of that away today also I think that we may switch to [facility] dressing to see how things do going forward. He is in agreement with that plan. Subsequently I am extremely pleased with where we stand  currently. No fevers, chills, nausea, vomiting, or diarrhea. 01-02-2023 upon evaluation today patient appears to be doing much better in regard to his wounds the right foot appears to be almost healed the left foot though not completely healed is significantly better. Fortunately there does not appear to be any signs of active infection at this point. 01-10-2023 upon evaluation today patient appears to be doing well currently in regard to his wounds. The right heel actually is completely healed the left heel is definitely headed in the right direction and looks to be doing awesome. I do not see any signs of active infection locally nor systemically at this time which is great news. Objective Constitutional Well-nourished and well-hydrated in no acute distress. Vitals Time Taken: 11:09 AM, Height: 69 in, Weight: 190 lbs, BMI: 28.1, Temperature: 97.3 F, Pulse: 93 bpm, Respiratory Rate: 16 breaths/min, Blood Pressure: 110/62 mmHg. Respiratory normal breathing without difficulty. Psychiatric this patient is able to make decisions and demonstrates good insight into disease process. Alert and Oriented x 3. pleasant and cooperative. General Notes: Upon inspection patient's wound bed actually showed signs of good granulation epithelization at this point. Fortunately I do not see any evidence of infection locally nor systemically which is great news and in general I think that he is doing excellent the left heel did require debridement I did use a little bit of silver  nitrate to achieve hemostasis otherwise the right heel is completely healed. Integumentary (Hair, Skin) Wound #4 status is Healed - Epithelialized. Original cause of wound was Pressure Injury. The date acquired was: 10/20/2022. The wound has been in treatment 6 weeks. The wound is located on the Right Calcaneus. The wound measures 0cm length x 0cm width x 0cm depth; 0cm^2 area and 0cm^3 volume. There is a medium amount of serosanguineous  drainage noted. Wound #5 status is Open. Original cause of wound was Pressure Injury. The date acquired was: 10/20/2022. The wound has been in treatment 6 weeks. The wound is located on the Left Calcaneus. The wound measures 1.5cm length x 1.8cm width x 0.2cm depth; 2.121cm^2 area and 0.424cm^3 volume. There is a medium amount of serosanguineous drainage noted. 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 There was a Excisional Skin/Subcutaneous Tissue Debridement with a total area of 2.7 sq cm performed by [name], [name]. With the following instrument(s): Curette to remove Viable and Non-Viable tissue/material. Material removed includes Subcutaneous Tissue and Slough and after achieving pain control using Lidocaine 4% T opical Solution. No specimens were taken. A time out was conducted at 11:36, prior to the start of the procedure. A Moderate amount of bleeding was controlled with Silver Nitrate. The procedure was tolerated well. Post Debridement Measurements: 1.5cm length x 1.8cm width x 0.3cm depth; 0.636cm^3 volume. Post debridement Stage noted as Category/Stage III. Character of Wound/Ulcer Post Debridement is stable. Post procedure Diagnosis Wound #5: Same as Pre-Procedure Plan Follow-up Appointments: Return Appointment in 1 week. Home Health: [facility] for [facility] care. May  utilize formulary equivalent dressing for wound treatment orders unless otherwise specified. Home Health Nurse may visit PRN to address patientoos wound care needs. Alvis Lemmings fax [phone] Bathing/ Shower/ Hygiene: May shower; gently cleanse wound with antibacterial soap, rinse and pat dry prior to dressing wounds Anesthetic (Use 'Patient Medications' Section for Anesthetic Order Entry): Lidocaine applied to wound bed Edema Control - Lymphedema / Segmental Compressive Device / Other: Elevate, Exercise Daily and Avoid Standing for Long Periods of Time. Elevate legs to the level of the heart and pump ankles as often as possible Elevate leg(s) parallel to the floor when sitting. WOUND #5: - Calcaneus Wound Laterality: Left Cleanser: Soap and Water 1 x Per Day/30 Days Discharge Instructions: Gently cleanse wound with antibacterial soap, rinse and pat dry prior to dressing wounds Prim Dressing: Hydrofera Blue Ready Transfer Foam, 2.5x2.5 (in/in) (Dispense As Written) 1 x Per Day/30 Days ary Discharge Instructions: Apply Hydrofera Blue Ready to wound bed as directed Secondary Dressing: (BORDER) Zetuvit Plus SILICONE BORDER Dressing 4x4 (in/in) (Generic) 1 x Per Day/30 Days Discharge Instructions: Please do not put silicone bordered dressings under wraps. Use non-bordered dressing only. 1. I am recommend currently that we going to continue to monitor for any signs of infection or worsening. Based on what I am seeing I do believe that we are headed in the right direction here. 2. I am also can recommend patient should continue to  use the bordered foam dressing to cover in regard to the left heel. The right heel just cover Band-Aid for protection should be sufficient. We will see patient back for reevaluation in 1 week here in the clinic. If anything worsens or changes patient will contact our office for additional recommendations. Electronic Signature(s) Signed: 01/10/2023 12:00:57 PM By: [name] Entered By: Worthy Keeler on 01/10/2023 12:00:56 -------------------------------------------------------------------------------- SuperBill Details Patient Name: Date of Service: [name], [name] 01/10/2023 Medical Record Number: [id number] Patient Account Number: [id number] Date of Birth/Sex: Treating RN: 1946-01-10 (77 y.o. [name] Provider: [name] Other Clinician: Referring Provider: Treating Provider/Extender: [name], [name] ([id number]) [id number] Page 9 of 9 Weeks in Treatment: 6 Diagnosis Coding ICD-10 Codes Code Description E10.621 Type 1 diabetes mellitus with foot ulcer I73.89 Other specified peripheral vascular diseases L89.613 Pressure ulcer of right heel, stage 3 L89.623 Pressure ulcer of left heel, stage 3 L98.412 Non-pressure chronic ulcer of buttock with fat layer exposed I25.10 Atherosclerotic heart disease of native coronary artery without angina pectoris I50.42 Chronic combined systolic (congestive) and diastolic (congestive) heart failure I10 Essential (primary) hypertension Facility Procedures : CPT4 Code: [id number] Description: [place] - DEB SUBQ TISSUE 20 SQ CM/< ICD-10 Diagnosis Description L89.623 Pressure ulcer of left heel, stage 3 Modifier: Quantity: 1 Physician Procedures : CPT4 Code Description Modifier [id number] - WC PHYS SUBQ TISS 20 SQ CM ICD-10 Diagnosis Description L89.623 Pressure ulcer of left heel, stage 3 Quantity: 1 Electronic Signature(s) Signed: 01/10/2023 1:18:43 PM By: [name] Signed: 01/10/2023 1:46:09 PM By: [name] Previous Signature: 01/10/2023 12:01:05 PM Version By: [name] Entered By: [name] on 01/10/2023 13:18:42

## 2023-01-16 ENCOUNTER — Encounter: Payer: Medicare HMO | Admitting: Internal Medicine

## 2023-01-16 DIAGNOSIS — L89610 Pressure ulcer of right heel, unstageable: Secondary | ICD-10-CM | POA: Diagnosis not present

## 2023-01-16 DIAGNOSIS — M720 Palmar fascial fibromatosis [Dupuytren]: Secondary | ICD-10-CM | POA: Diagnosis not present

## 2023-01-16 DIAGNOSIS — L89623 Pressure ulcer of left heel, stage 3: Secondary | ICD-10-CM | POA: Diagnosis not present

## 2023-01-16 DIAGNOSIS — L89620 Pressure ulcer of left heel, unstageable: Secondary | ICD-10-CM | POA: Diagnosis not present

## 2023-01-16 DIAGNOSIS — I110 Hypertensive heart disease with heart failure: Secondary | ICD-10-CM | POA: Diagnosis not present

## 2023-01-16 DIAGNOSIS — L98412 Non-pressure chronic ulcer of buttock with fat layer exposed: Secondary | ICD-10-CM | POA: Diagnosis not present

## 2023-01-16 DIAGNOSIS — E1051 Type 1 diabetes mellitus with diabetic peripheral angiopathy without gangrene: Secondary | ICD-10-CM | POA: Diagnosis not present

## 2023-01-16 DIAGNOSIS — L97422 Non-pressure chronic ulcer of left heel and midfoot with fat layer exposed: Secondary | ICD-10-CM | POA: Diagnosis not present

## 2023-01-16 DIAGNOSIS — L89613 Pressure ulcer of right heel, stage 3: Secondary | ICD-10-CM | POA: Diagnosis not present

## 2023-01-16 DIAGNOSIS — E10621 Type 1 diabetes mellitus with foot ulcer: Secondary | ICD-10-CM | POA: Diagnosis not present

## 2023-01-16 DIAGNOSIS — I5042 Chronic combined systolic (congestive) and diastolic (congestive) heart failure: Secondary | ICD-10-CM | POA: Diagnosis not present

## 2023-01-18 NOTE — Progress Notes (Signed)
KYROS, KREMIN ([id number]) [id number] Page 1 of 7 Visit Report for 01/16/2023 HPI Details Patient Name: Date of Service: [name], [name] 01/16/2023 1:00 PM Medical Record Number: [id number] Patient Account Number: [id number] Date of Birth/Sex: Treating RN: 1946/10/09 (77 y.o. Joetta Manners, [place] Primary Care Provider: [name] Other Clinician: Referring Provider: Treating Provider/Extender: RO BSO N, [place] EL Hubbard Hartshorn in Treatment: 7 History of Present Illness Location: right anterior shin Quality: Patient reports No Pain. Severity: Patient states wound are getting worse. Duration: Patient has had the wound for > 6 months prior to seeking treatment at the wound center Context: The wound appeared gradually over time Modifying Factors: Other treatment(s) tried include: taken doxycycline and use bacitracin ssociated Signs and Symptoms: Patient reports presence of swelling A HPI Description: 77 year old male recently seen by his PCP Dr. [name] who saw him for a wound on the right leg which is less tender and he is continuing to use bacitracin and has completed her course of doxycycline. Past medical history of coronary artery disease, diabetes mellitus type 1, hyperlipidemia, hypertension, MI, plantar fascial fibromatosis, pneumonia, status post foot surgery due to trauma on the left side, coronary angioplasty. he is a former smoker and quit in July 1996. last hemoglobin A1c was 7.8 % in April of this year 5/22 2017 -- biopsy of the right lower extremity was sent for pathology and the report is that of a basal cell carcinoma with edges of the biopsy are involved. I have called the patient over the phone( 03/28/16) and discussed the pathology report with him and he is agreeable to see a surgeon for excision and need full. I have also spoken personally to Dr. [name], of [facility] surgical and referred the patient for further wide excision  of this lesion and have communicated this to the patient. READMISSION 08/12/18 This is a now 77 year old man with type 1 diabetes. Hemoglobin A1c on 4/29 was 7.9. He is listed as having a history of PAD in the [place] records although the patient doesn't recall this, doesn't complain of any symptoms of claudication and doesn't believe he has had any prior noninvasive arterial test. He is a nonsmoker. He was here in 2017 with a wound on his Right lower leg. This was biopsied in the clinic and pathology showed a basal cell carcinoma. He went on to have surgery on this area this is currently where I believe his current wounds are located. He generally bumped the lateral part of his right calf 2 weeks ago and has a superficial abrasion. He also has 2 small open areas on the medial part of the tibia in the same area. Our intake nurse noted a stitch coming out of this which was removed. These of the wound sees actually come to the clinic for. However the more concerning area is an area on the tip of the left great toe. He says that this was initially traumatic and he's been to see Dr. [name] podiatrist. Darral Dash been using what I think is Santyl to the wound tip. This has some depth. ABIs in this clinic were non-obtainable on the right and 1.84 on the left. 08/19/18; the patient had arterial studies that did not show evidence of significant hemodynamically compromising occlusions in either leg. There was mild medial atherosclerosis bilaterally. His resting ABIs were within the normal limits at 1.3 on the right and 1.4 on the left. There was normal posterior and anterior tibial artery waveforms and velocities listed on  both sides. The patient arrives with what appears to be a healthy surface over the tip of his left great toe. This is indeed surprising. Still looks somewhat friable however. More concerning is the x-ray that we did that showed erosions of the tuft of the distal phalanx the left great toe  consistent with osteomyelitis. I attempted to pull this x-ray up on colon health Link however I can't open the film to look at this myself. 08/26/18; I reviewed the patient's x-ray and colon helpline. Indeed there is fairly obvious erosion of the tip of his left great toe. The wound was initially a traumatic area hitting it sometime in the middle of night in his kitchen. He is a type I diabetic. I have him on Flagyl and Levaquin that I started last week i.e. 7 days ago. He has an appointment with infectious disease on 09/01/18 09/02/18; the patient was seen by Dr. Delaine Lame of infectious disease. She did not feel the patient needed IV antibiotics. I do feel he needs further oral antibiotics however. He does not need anymore Flagyl but he does need another 2 weeks of Levaquin. The areas just about closed. 09/16/18; the patient is completing his Levaquin today. There is no open wound on the tip of the toe Readmission: 11-26-2022 upon evaluation today patient presents for initial inspection here in our clinic concerning issues that he has been having after having been hospitalized due to septic arthritis. Subsequently he has a PICC line that is been removed and he has been discharged from infectious disease in that regard. Unfortunately he developed shearing wounds over the gluteal region as well as a pressure ulcer over each heel although they are not looking extremely bad nonetheless they do seem to be evidence of pressure which she sustained while hospitalized. Obviously he was very sick and is still recovering as far as that is concerned. Fortunately there does not appear to be any evidence of active infection systemically which is good news at this point according to Dr. Ardyth Gal. Patient's wounds also do not appear to be too bad at this point which is good news. Patient does have a history of type 1 diabetes mellitus, peripheral vascular disease, coronary artery disease, congestive heart failure,  hypertension, and he is on long-term anticoagulant therapy. [name], [name] ([id number]) [id number] Page 2 of 7 12-03-2022 upon evaluation today patient appears to be doing better to some degree in regard to his ulcers on his heels at this point. We are still waiting on the actual appointment with the vascular surgeons but nonetheless in the meantime I do believe that he is making some good progress currently with regard to loosen up the eschar. I did actually feel like that there was some ability for [place] to remove some of the necrotic tissue today and I discussed that with the patient. He is in agreement with that plan. We also can probably see about making an adjustment in the treatment regimen. 12-10-2022 upon evaluation today patient's wounds were really doing about the same. Again he is still awaiting the evaluation with the vascular surgeon. That should have already been done but had to be pushed back unfortunately. He will be seeing them next week on the seventh. I plan to see him following somewhere around the end of the week I think will probably be best. The patient voiced understanding. 12-19-2022 upon evaluation today patient appears to be doing well currently in regard to his wound we did get the results of his arterial studies  which show that he has excellent ABIs with no signs of vascular compromise. For that reason I will go ahead and perform some debridement today to clearway the necrotic debris with his Eliquis that I will be too aggressive so this will still be a stepwise process but we are to go ahead and get that started. 12-27-2022 upon evaluation today patient appears to be doing well currently in regard to his wounds. They are actually showing signs of excellent improvement. There is some necrotic tissue noted on the surface of wounds to work on clearing some of that away today also I think that we may switch to [facility] dressing to see how  things do going forward. He is in agreement with that plan. Subsequently I am extremely pleased with where we stand currently. No fevers, chills, nausea, vomiting, or diarrhea. 01-02-2023 upon evaluation today patient appears to be doing much better in regard to his wounds the right foot appears to be almost healed the left foot though not completely healed is significantly better. Fortunately there does not appear to be any signs of active infection at this point. 01-10-2023 upon evaluation today patient appears to be doing well currently in regard to his wounds. The right heel actually is completely healed the left heel is definitely headed in the right direction and looks to be doing awesome. I do not see any signs of active infection locally nor systemically at this time which is great news. 3/7; his right heel has remained healed. There is no evidence of active infection. The remaining wound is on the lateral aspect of the heel just above the plantar surface Electronic Signature(s) Signed: 01/16/2023 4:44:08 PM By: Linton Ham MD Entered By: Linton Ham on 01/16/2023 13:51:04 -------------------------------------------------------------------------------- Physical Exam Details Patient Name: Date of Service: [name], [name] 01/16/2023 1:00 PM Medical Record Number: [id number] Patient Account Number: [id number] Date of Birth/Sex: Treating RN: Nov 14, 1945 (77 y.o. Joetta Manners, [place] Primary Care Provider: [name] Other Clinician: Referring Provider: Treating Provider/Extender: RO BSO N, [place] EL Hubbard Hartshorn in Treatment: 7 Constitutional Sitting or standing Blood Pressure is within target range for patient.. Pulse regular and within target range for patient.Marland Kitchen Respirations regular, non-labored and within target range.. Temperature is normal and within the target range for the patient.Marland Kitchen appears in no distress. Notes Wound exam; lateral aspect of the heel. The  surface of this is not too bad however it is just above the weightbearing surface. I did not think he required debridement today Electronic Signature(s) Signed: 01/16/2023 4:44:08 PM By: Linton Ham MD Entered By: Linton Ham on 01/16/2023 13:52:11 [name], [name] ([id number]) [id number] Page 3 of 7 -------------------------------------------------------------------------------- Physician Orders Details Patient Name: Date of Service: [name], [name] 01/16/2023 1:00 PM Medical Record Number: [id number] Patient Account Number: [id number] Date of Birth/Sex: Treating RN: 1946-09-17 (77 y.o. [name] Primary Care Provider: [name] Other Clinician: Referring Provider: Treating Provider/Extender: RO BSO N, [place] EL Hubbard Hartshorn in Treatment: 7 Verbal / Phone Orders: No Diagnosis Coding Follow-up Appointments ppointment in 2 weeks. - home health sees patient weekly. Return A Nurse Visit as needed [place] for wound care. May utilize formulary equivalent dressing for wound treatment orders unless otherwise specified. Home Health Nurse may visit PRN to address patients wound care needs. Alvis Lemmings fax [phone] [facility] Nurse. PLEASE change dressing on the left heel when you visit unless partner has already changed dressing. Apply Hydrofera blue and  cover with a bordered foam dressing. Reapply compression stockings Bathing/ Shower/ Hygiene May shower; gently cleanse wound with antibacterial soap, rinse and pat dry prior to dressing wounds Anesthetic (Use 'Patient Medications' Section for Anesthetic Order Entry) Lidocaine applied to wound bed Edema Control - Lymphedema / Segmental Compressive Device / Other Elevate, Exercise Daily and A void Standing for Long Periods of Time. Elevate legs to the level of the heart and pump ankles as often as possible Elevate leg(s) parallel to the floor when  sitting. Wound Treatment Wound #5 - Calcaneus Wound Laterality: Left Cleanser: Soap and Water 1 x Per Day/30 Days Discharge Instructions: Gently cleanse wound with antibacterial soap, rinse and pat dry prior to dressing wounds Prim Dressing: Hydrofera Blue Ready Transfer Foam, 2.5x2.5 (in/in) (Dispense As Written) 1 x Per Day/30 Days ary Discharge Instructions: Apply Hydrofera Blue Ready to wound bed as directed Secondary Dressing: (BORDER) Zetuvit Plus SILICONE BORDER Dressing 4x4 (in/in) (Generic) 1 x Per Day/30 Days Discharge Instructions: Please do not put silicone bordered dressings under wraps. Use non-bordered dressing only. Electronic Signature(s) Signed: 01/16/2023 4:44:08 PM By: Linton Ham MD Signed: 01/16/2023 4:59:51 PM By: Levora Dredge Entered By: Levora Dredge on 01/16/2023 14:00:37 [name], [name] ([id number]) [id number] Page 4 of 7 -------------------------------------------------------------------------------- Problem List Details Patient Name: Date of Service: [name], [name] 01/16/2023 1:00 PM Medical Record Number: [id number] Patient Account Number: [id number] Date of Birth/Sex: Treating RN: [birth year] (77 y.o. Joetta Manners, [place] Primary Care Provider: [name] Other Clinician: Referring Provider: Treating Provider/Extender: Eldridge Dace, MIC[place] EL Hubbard Hartshorn in Treatment: 7 Active Problems ICD-10 Encounter Code Description Active Date MDM Diagnosis E10.621 Type 1 diabetes mellitus with foot ulcer 11/26/2022 No Yes I73.89 Other specified peripheral vascular diseases 11/26/2022 No Yes L89.623 Pressure ulcer of left heel, stage 3 11/26/2022 No Yes L89.613 Pressure ulcer of right heel, stage 3 11/26/2022 No Yes L98.412 Non-pressure chronic ulcer of buttock with fat layer exposed 11/26/2022 No Yes I25.10 Atherosclerotic heart disease of native coronary artery without angina pectoris 11/26/2022 No Yes I50.42  Chronic combined systolic (congestive) and diastolic (congestive) heart failure 11/26/2022 No Yes I10 Essential (primary) hypertension 11/26/2022 No Yes Inactive Problems Resolved Problems Electronic Signature(s) Signed: 01/16/2023 4:44:08 PM By: Linton Ham MD Entered By: Linton Ham on 01/16/2023 13:49:42 -------------------------------------------------------------------------------- Progress Note Details Patient Name: Date of Service: [name], [name] 01/16/2023 1:00 PM Medical Record Number: [id number] Patient Account Number: [id number] Date of Birth/Sex: Treating RN: May 11, 1946 (77 y.o. [address], [place] ([id number]) [id number] Page 5 of 7 Primary Care Provider: [name] Other Clinician: Referring Provider: Treating Provider/Extender: RO BSO N, [place] EL Hubbard Hartshorn in Treatment: 7 Subjective History of Present Illness (HPI) The following HPI elements were documented for the patient's wound: Location: right anterior shin Quality: Patient reports No Pain. Severity: Patient states wound are getting worse. Duration: Patient has had the wound for > 6 months prior to seeking treatment at the wound center Context: The wound appeared gradually over time Modifying Factors: Other treatment(s) tried include: taken doxycycline and use bacitracin Associated Signs and Symptoms: Patient reports presence of swelling 77 year old male recently seen by his PCP Dr. [name] who saw him for a wound on the right leg which is less tender and he is continuing to use bacitracin and has completed her course of doxycycline. Past medical history of coronary artery disease, diabetes mellitus type 1, hyperlipidemia, hypertension, MI, plantar fascial fibromatosis, pneumonia, status post foot surgery  due to trauma on the left side, coronary angioplasty. he is a former smoker and quit in July 1996. last hemoglobin A1c was 7.8 % in  April of this year 5/22 2017 -- biopsy of the right lower extremity was sent for pathology and the report is that of a basal cell carcinoma with edges of the biopsy are involved. I have called the patient over the phone( 03/28/16) and discussed the pathology report with him and he is agreeable to see a surgeon for excision and need full. I have also spoken personally to Dr. [name], of [facility] surgical and referred the patient for further wide excision of this lesion and have communicated this to the patient. READMISSION 08/12/18 This is a now 77 year old man with type 1 diabetes. Hemoglobin A1c on 4/29 was 7.9. He is listed as having a history of PAD in the [place] records although the patient doesn't recall this, doesn't complain of any symptoms of claudication and doesn't believe he has had any prior noninvasive arterial test. He is a nonsmoker. He was here in 2017 with a wound on his Right lower leg. This was biopsied in the clinic and pathology showed a basal cell carcinoma. He went on to have surgery on this area this is currently where I believe his current wounds are located. He generally bumped the lateral part of his right calf 2 weeks ago and has a superficial abrasion. He also has 2 small open areas on the medial part of the tibia in the same area. Our intake nurse noted a stitch coming out of this which was removed. These of the wound sees actually come to the clinic for. However the more concerning area is an area on the tip of the left great toe. He says that this was initially traumatic and he's been to see Dr. [name] podiatrist. Darral Dash been using what I think is Santyl to the wound tip. This has some depth. ABIs in this clinic were non-obtainable on the right and 1.84 on the left. 08/19/18; the patient had arterial studies that did not show evidence of significant hemodynamically compromising occlusions in either leg. There was mild medial atherosclerosis bilaterally. His resting ABIs  were within the normal limits at 1.3 on the right and 1.4 on the left. There was normal posterior and anterior tibial artery waveforms and velocities listed on both sides. The patient arrives with what appears to be a healthy surface over the tip of his left great toe. This is indeed surprising. Still looks somewhat friable however. More concerning is the x-ray that we did that showed erosions of the tuft of the distal phalanx the left great toe consistent with osteomyelitis. I attempted to pull this x-ray up on colon health Link however I can't open the film to look at this myself. 08/26/18; I reviewed the patient's x-ray and colon helpline. Indeed there is fairly obvious erosion of the tip of his left great toe. The wound was initially a traumatic area hitting it sometime in the middle of night in his kitchen. He is a type I diabetic. I have him on Flagyl and Levaquin that I started last week i.e. 7 days ago. He has an appointment with infectious disease on 09/01/18 09/02/18; the patient was seen by Dr. Delaine Lame of infectious disease. She did not feel the patient needed IV antibiotics. I do feel he needs further oral antibiotics however. He does not need anymore Flagyl but he does need another 2 weeks of Levaquin. The areas just about  closed. 09/16/18; the patient is completing his Levaquin today. There is no open wound on the tip of the toe Readmission: 11-26-2022 upon evaluation today patient presents for initial inspection here in our clinic concerning issues that he has been having after having been hospitalized due to septic arthritis. Subsequently he has a PICC line that is been removed and he has been discharged from infectious disease in that regard. Unfortunately he developed shearing wounds over the gluteal region as well as a pressure ulcer over each heel although they are not looking extremely bad nonetheless they do seem to be evidence of pressure which she sustained while  hospitalized. Obviously he was very sick and is still recovering as far as that is concerned. Fortunately there does not appear to be any evidence of active infection systemically which is good news at this point according to Dr. Ardyth Gal. Patient's wounds also do not appear to be too bad at this point which is good news. Patient does have a history of type 1 diabetes mellitus, peripheral vascular disease, coronary artery disease, congestive heart failure, hypertension, and he is on long-term anticoagulant therapy. 12-03-2022 upon evaluation today patient appears to be doing better to some degree in regard to his ulcers on his heels at this point. We are still waiting on the actual appointment with the vascular surgeons but nonetheless in the meantime I do believe that he is making some good progress currently with regard to loosen up the eschar. I did actually feel like that there was some ability for [place] to remove some of the necrotic tissue today and I discussed that with the patient. He is in agreement with that plan. We also can probably see about making an adjustment in the treatment regimen. 12-10-2022 upon evaluation today patient's wounds were really doing about the same. Again he is still awaiting the evaluation with the vascular surgeon. That should have already been done but had to be pushed back unfortunately. He will be seeing them next week on the seventh. I plan to see him following somewhere around the end of the week I think will probably be best. The patient voiced understanding. 12-19-2022 upon evaluation today patient appears to be doing well currently in regard to his wound we did get the results of his arterial studies which show that he has excellent ABIs with no signs of vascular compromise. For that reason I will go ahead and perform some debridement today to clearway the necrotic debris with his Eliquis that I will be too aggressive so this will still be a stepwise process but  we are to go ahead and get that started. 12-27-2022 upon evaluation today patient appears to be doing well currently in regard to his wounds. They are actually showing signs of excellent improvement. There is some necrotic tissue noted on the surface of wounds to work on clearing some of that away today also I think that we may switch to [facility] dressing to see how things do going forward. He is in agreement with that plan. Subsequently I am extremely pleased with where we stand currently. No fevers, chills, nausea, vomiting, or diarrhea. 01-02-2023 upon evaluation today patient appears to be doing much better in regard to his wounds the right foot appears to be almost healed the left foot though not completely healed is significantly better. Fortunately there does not appear to be any signs of active infection at this point. 01-10-2023 upon evaluation today patient appears to be doing well currently in regard to  his wounds. The right heel actually is completely healed the left heel is [name], [name] ([id number]) [id number] Page 6 of 7 definitely headed in the right direction and looks to be doing awesome. I do not see any signs of active infection locally nor systemically at this time which is great news. 3/7; his right heel has remained healed. There is no evidence of active infection. The remaining wound is on the lateral aspect of the heel just above the plantar surface Objective Constitutional Sitting or standing Blood Pressure is within target range for patient.. Pulse regular and within target range for patient.Marland Kitchen Respirations regular, non-labored and within target range.. Temperature is normal and within the target range for the patient.Marland Kitchen appears in no distress. Vitals Time Taken: 1:01 PM, Height: 69 in, Weight: 190 lbs, BMI: 28.1, Temperature: 97.5 F, Pulse: 60 bpm, Respiratory Rate: 18 breaths/min, Blood Pressure: 116/71 mmHg. General Notes: Wound  exam; lateral aspect of the heel. The surface of this is not too bad however it is just above the weightbearing surface. I did not think he required debridement today Integumentary (Hair, Skin) Wound #5 status is Open. Original cause of wound was Pressure Injury. The date acquired was: 10/20/2022. The wound has been in treatment 7 weeks. The wound is located on the Left Calcaneus. The wound measures 1.2cm length x 2.2cm width x 0.2cm depth; 2.073cm^2 area and 0.415cm^3 volume. There is Fat Layer (Subcutaneous Tissue) exposed. There is no tunneling or undermining noted. There is a medium amount of serosanguineous drainage noted. There is small (1-33%) red, pink granulation within the wound bed. There is a large (67-100%) amount of necrotic tissue within the wound bed including Adherent Slough. 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 #1 no evidence of infection 2. I am not sure of the exact measurements here but it does not appear to be larger. 3. Does not probe deeply 4. We will have to keep a close eye on this to make sure the wound is closing in. 5. Hydrofera Blue border foam Electronic Signature(s) Signed: 01/16/2023 4:44:08 PM By: Linton Ham MD Entered By: Linton Ham on 01/16/2023 13:53:49 Breton, [name] ([id number]) [id number] Page 7 of 7 -------------------------------------------------------------------------------- SuperBill Details Patient Name: Date of Service: [name], [name] 01/16/2023 Medical Record Number:  [id number] Patient Account Number: [id number] Date of Birth/Sex: Treating RN: 1946/08/26 (77 y.o. Joetta Manners, [place] Primary Care Provider: [name] Other Clinician: Referring Provider: Treating Provider/Extender: Eldridge Dace, MICHA EL Hubbard Hartshorn in Treatment: 7 Diagnosis Coding ICD-10 Codes Code Description E10.621 Type 1 diabetes mellitus with foot ulcer I73.89 Other specified peripheral vascular diseases L89.623 Pressure ulcer of left heel, stage 3 L89.613 Pressure ulcer of right heel, stage 3 L98.412 Non-pressure chronic ulcer of buttock with fat layer exposed I25.10 Atherosclerotic heart disease of native coronary artery without angina pectoris I50.42 Chronic combined systolic (congestive) and diastolic (congestive) heart failure I10 Essential (primary) hypertension Facility Procedures : CPT4 Code: [id number] Description: [id number] - WOUND CARE VISIT-LEV 3 EST PT Modifier: Quantity: 1 Physician Procedures : CPT4 Code Description Modifier [id number] - WC PHYS LEVEL 3 - EST PT ICD-10 Diagnosis Description E10.621 Type 1 diabetes  mellitus with foot ulcer I73.89 Other specified peripheral vascular diseases L89.623 Pressure ulcer of left heel, stage 3 Quantity: 1 Electronic Signature(s) Signed: 01/16/2023 4:44:08 PM By: Linton Ham MD Signed: 01/16/2023 4:59:51 PM By: Levora Dredge Entered By: Levora Dredge on 01/16/2023 13:58:33

## 2023-01-18 NOTE — Progress Notes (Addendum)
[name], [name] ([id number]) [id number] Page 1 of 9 Visit Report for 01/16/2023 Arrival Information Details Patient Name: Date of Service: [name], [name] 01/16/2023 1:00 PM Medical Record Number: [id number] Patient Account Number: [id number] Date of Birth/Sex: Treating RN: May 30, 1946 (77 y.o. [name] Primary Care Alithea Lapage: [name] Other Clinician: Referring Jaely Silman: Treating Ambriana Selway/Extender: RO BSO N, MICHA EL Hubbard Hartshorn in Treatment: 7 Visit Information History Since Last Visit Added or deleted any medications: No Patient Arrived: [name] Any new allergies or adverse reactions: No Arrival Time: 12:58 Had a fall or experienced change in No Accompanied By: family activities of daily living that may affect Transfer Assistance: None risk of falls: Patient Identification Verified: Yes Signs or symptoms of abuse/neglect since last visito No Secondary Verification Process Completed: Yes Hospitalized since last visit: No Patient Requires Transmission-Based Precautions: No Has Dressing in Place as Prescribed: Yes Patient Has Alerts: Yes Has Footwear/Offloading in Place as Prescribed: Yes Patient Alerts: Type I Diabetic Left: Other: eliquis Pain Present Now: No ABI/TBI normal see scan Electronic Signature(s) Signed: 01/16/2023 4:59:51 PM By: Levora Dredge Entered By: Levora Dredge on 01/16/2023 13:01:33 -------------------------------------------------------------------------------- Clinic Level of Care Assessment Details Patient Name: Date of Service: [name], [name] 01/16/2023 1:00 PM Medical Record Number: [id number] Patient Account Number: [id number] Date of Birth/Sex: Treating RN: [birth year] (77 y.o. [name] Primary Care Josiel Gahm: [name] Other Clinician: Referring Kalynne Womac: Treating Kamri Gotsch/Extender: RO BSO N, MICHA EL Hubbard Hartshorn in Treatment: 7 Clinic Level of  Care Assessment Items TOOL 4 Quantity Score '[]'$  - 0 Use when only an EandM is performed on FOLLOW-UP visit ASSESSMENTS - Nursing Assessment / Reassessment X- 1 10 Reassessment of Co-morbidities (includes updates in patient status) X- 1 5 Reassessment of Adherence to Treatment Plan ASSESSMENTS - Wound and Skin Assessment / Reassessment [name], [name] ([id number]) [id number] Page 2 of 9 X- 1 5 Simple Wound Assessment / Reassessment - one wound '[]'$  - 0 Complex Wound Assessment / Reassessment - multiple wounds '[]'$  - 0 Dermatologic / Skin Assessment (not related to wound area) ASSESSMENTS - Focused Assessment X- 1 5 Circumferential Edema Measurements - multi extremities '[]'$  - 0 Nutritional Assessment / Counseling / Intervention '[]'$  - 0 Lower Extremity Assessment (monofilament, tuning fork, pulses) '[]'$  - 0 Peripheral Arterial Disease Assessment (using hand held doppler) ASSESSMENTS - Ostomy and/or Continence Assessment and Care '[]'$  - 0 Incontinence Assessment and Management '[]'$  - 0 Ostomy Care Assessment and Management (repouching, etc.) PROCESS - Coordination of Care X - Simple Patient / Family Education for ongoing care 1 15 '[]'$  - 0 Complex (extensive) Patient / Family Education for ongoing care '[]'$  - 0 Staff obtains Programmer, systems, Records, T Results / Process Orders est '[]'$  - 0 Staff telephones HHA, Nursing Homes / Clarify orders / etc '[]'$  - 0 Routine Transfer to another Facility (non-emergent condition) '[]'$  - 0 Routine Hospital Admission (non-emergent condition) '[]'$  - 0 New Admissions / Biomedical engineer / Ordering NPWT Apligraf, etc. , '[]'$  - 0 Emergency Hospital Admission (emergent condition) X- 1 10 Simple Discharge Coordination '[]'$  - 0 Complex (extensive) Discharge Coordination PROCESS - Special Needs '[]'$  - 0 Pediatric / Minor Patient Management '[]'$  - 0 Isolation Patient Management '[]'$  - 0 Hearing / Language / Visual special needs '[]'$  -  0 Assessment of Community assistance (transportation, D/C planning, etc.) '[]'$  - 0 Additional assistance / Altered mentation '[]'$  - 0 Support Surface(s) Assessment (bed, cushion, seat, etc.) INTERVENTIONS - Wound Cleansing /  Measurement X - Simple Wound Cleansing - one wound 1 5 '[]'$  - 0 Complex Wound Cleansing - multiple wounds X- 1 5 Wound Imaging (photographs - any number of wounds) '[]'$  - 0 Wound Tracing (instead of photographs) X- 1 5 Simple Wound Measurement - one wound '[]'$  - 0 Complex Wound Measurement - multiple wounds INTERVENTIONS - Wound Dressings X - Small Wound Dressing one or multiple wounds 1 10 '[]'$  - 0 Medium Wound Dressing one or multiple wounds '[]'$  - 0 Large Wound Dressing one or multiple wounds X- 1 5 Application of Medications - topical '[]'$  - 0 Application of Medications - injection INTERVENTIONS - Miscellaneous '[]'$  - 0 External ear exam '[]'$  - 0 Specimen Collection (cultures, biopsies, blood, body fluids, etc.) [name], [name] ([id number].pdf Page 3 of 9 '[]'$  - 0 Specimen(s) / Culture(s) sent or taken to Lab for analysis '[]'$  - 0 Patient Transfer (multiple staff / Harrel Lemon Lift / Similar devices) '[]'$  - 0 Simple Staple / Suture removal (25 or less) '[]'$  - 0 Complex Staple / Suture removal (26 or more) '[]'$  - 0 Hypo / Hyperglycemic Management (close monitor of Blood Glucose) '[]'$  - 0 Ankle / Brachial Index (ABI) - do not check if billed separately X- 1 5 Vital Signs Has the patient been seen at the hospital within the last three years: Yes Total Score: 85 Level Of Care: New/Established - Level 3 Electronic Signature(s) Signed: 01/16/2023 4:59:51 PM By: Levora Dredge Entered By: Levora Dredge on 01/16/2023 13:58:25 -------------------------------------------------------------------------------- Encounter Discharge Information Details Patient Name: Date of Service: [name], [name] 01/16/2023 1:00 PM Medical Record Number:  [id number] Patient Account Number: [id number] Date of Birth/Sex: Treating RN: 12-30-1945 (77 y.o. [name] Primary Care Maliik Karner: [name] Other Clinician: Referring Jerad Dunlap: Treating Adrian Specht/Extender: RO BSO N, MICHA EL Hubbard Hartshorn in Treatment: 7 Encounter Discharge Information Items Discharge Condition: Stable Ambulatory Status: Walker Discharge Destination: Home Transportation: Private Auto Accompanied By: family Schedule Follow-up Appointment: Yes Clinical Summary of Care: Electronic Signature(s) Signed: 01/16/2023 4:59:51 PM By: Levora Dredge Entered By: Levora Dredge on 01/16/2023 14:00:54 -------------------------------------------------------------------------------- Lower Extremity Assessment Details Patient Name: Date of Service: [name], [name] 01/16/2023 1:00 PM Medical Record Number: [id number] Patient Account Number: [id number] [name], [name] ([id number]) [id number] Page 4 of 9 Date of Birth/Sex: Treating RN: 12-27-1945 (77 y.o. [name] Primary Care Coty Larsh: [name] Other Clinician: Referring Solei Wubben: Treating Minha Fulco/Extender: RO BSO N, MICHA EL Hubbard Hartshorn in Treatment: 7 Edema Assessment Assessed: [Left: No] [Right: No] Edema: [Left: Ye] [Right: s] Calf Left: Right: Point of Measurement: 34 cm From Medial Instep 34.8 cm Ankle Left: Right: Point of Measurement: 10 cm From Medial Instep 25 cm Vascular Assessment Pulses: Dorsalis Pedis Palpable: [Left:Yes] Posterior Tibial Palpable: [Left:Yes] Electronic Signature(s) Signed: 01/16/2023 4:59:51 PM By: Levora Dredge Entered By: Levora Dredge on 01/16/2023 13:13:20 -------------------------------------------------------------------------------- Multi Wound Chart Details Patient Name: Date of Service: [name], [name] 01/16/2023 1:00 PM Medical Record Number: [id number] Patient Account Number:  [id number] Date of Birth/Sex: Treating RN: 28-Jan-1946 (77 y.o. [name] Primary Care Laquiesha Piacente: [name] Other Clinician: Referring Jaylah Goodlow: Treating Jadee Golebiewski/Extender: RO BSO N, MICHA EL Hubbard Hartshorn in Treatment: 7 Vital Signs Height(in): 69 Pulse(bpm): 60 Weight(lbs): 190 Blood Pressure(mmHg): 116/71 Body Mass Index(BMI): 28.1 Temperature(F): 97.5 Respiratory Rate(breaths/min): 18 [5:Photos:] [N/A:N/A] Left Calcaneus N/A N/A Wound Location: Pressure Injury N/A N/A Wounding Event: [name], [name] ([id number]) [id number] Page 5 of 9 Pressure  Ulcer N/A N/A Primary Etiology: Coronary Artery Disease, N/A N/A Comorbid History: Hypertension, Type I Diabetes, Osteoarthritis, Received Chemotherapy 10/20/2022 N/A N/A Date Acquired: 7 N/A N/A Weeks of Treatment: Open N/A N/A Wound Status: No N/A N/A Wound Recurrence: 1.2x2.2x0.2 N/A N/A Measurements L x W x D (cm) 2.073 N/A N/A A (cm) : rea 0.415 N/A N/A Volume (cm) : -340.10% N/A N/A % Reduction in A rea: -783.00% N/A N/A % Reduction in Volume: Category/Stage III N/A N/A Classification: Medium N/A N/A Exudate A mount: Serosanguineous N/A N/A Exudate Type: red, brown N/A N/A Exudate Color: Small (1-33%) N/A N/A Granulation A mount: Red, Pink N/A N/A Granulation Quality: Large (67-100%) N/A N/A Necrotic A mount: Fat Layer (Subcutaneous Tissue): Yes N/A N/A Exposed Structures: Fascia: No Tendon: No Muscle: No Joint: No Bone: No Small (1-33%) N/A N/A Epithelialization: Treatment Notes Electronic Signature(s) Signed: 01/16/2023 4:59:51 PM By: Levora Dredge Entered By: Levora Dredge on 01/16/2023 13:13:24 -------------------------------------------------------------------------------- Multi-Disciplinary Care Plan Details Patient Name: Date of Service: [name], [name] 01/16/2023 1:00 PM Medical Record Number: [id number] Patient  Account Number: [id number] Date of Birth/Sex: Treating RN: November 08, 1946 (77 y.o. [name] Primary Care Yee Gangi: [name] Other Clinician: Referring Dezmon Conover: Treating Fremon Zacharia/Extender: RO BSO N, MICHA EL Hubbard Hartshorn in Treatment: 7 Active Inactive Pressure Nursing Diagnoses: Potential for impaired tissue integrity related to pressure, friction, moisture, and shear Goals: Patient will remain free of pressure ulcers Date Initiated: 11/26/2022 Target Resolution Date: 12/24/2022 Goal Status: Active Interventions: Assess: immobility, friction, shearing, incontinence upon admission and as needed Assess offloading mechanisms upon admission and as needed Assess potential for pressure ulcer upon admission and as needed Notes: [name], [name] ([id number]) [id number] Page 6 of 9 Wound/Skin Impairment Nursing Diagnoses: Knowledge deficit related to ulceration/compromised skin integrity Goals: Patient/caregiver will verbalize understanding of skin care regimen Date Initiated: 11/26/2022 Date Inactivated: 01/16/2023 Target Resolution Date: 12/03/2022 Goal Status: Met Ulcer/skin breakdown will have a volume reduction of 30% by week 4 Date Initiated: 11/26/2022 Target Resolution Date: 12/24/2022 Goal Status: Active Ulcer/skin breakdown will have a volume reduction of 50% by week 8 Date Initiated: 11/26/2022 Target Resolution Date: 01/21/2023 Goal Status: Active Ulcer/skin breakdown will have a volume reduction of 80% by week 12 Date Initiated: 11/26/2022 Target Resolution Date: 02/18/2023 Goal Status: Active Ulcer/skin breakdown will heal within 14 weeks Date Initiated: 11/26/2022 Target Resolution Date: 03/18/2023 Goal Status: Active Interventions: Assess patient/caregiver ability to obtain necessary supplies Assess patient/caregiver ability to perform ulcer/skin care regimen upon admission and as needed Assess ulceration(s) every  visit Notes: Electronic Signature(s) Signed: 01/16/2023 4:59:51 PM By: Levora Dredge Entered By: Levora Dredge on 01/16/2023 13:59:08 -------------------------------------------------------------------------------- Pain Assessment Details Patient Name: Date of Service: [name], [name] 01/16/2023 1:00 PM Medical Record Number: [id number] Patient Account Number: [id number] Date of Birth/Sex: Treating RN: 09/15/1946 (77 y.o. [name] Primary Care Theona Muhs: [name] Other Clinician: Referring Ura Hausen: Treating Himmat Enberg/Extender: RO BSO N, [place] EL Hubbard Hartshorn in Treatment: 7 Active Problems Location of Pain Severity and Description of Pain Patient Has Paino No Site Locations Rate the pain. [name], [name] ([id number]) [id number] Page 7 of 9 Rate the pain. Current Pain Level: 0 Pain Management and Medication Current Pain Management: Electronic Signature(s) Signed: 01/16/2023 4:59:51 PM By: Levora Dredge Entered By: Levora Dredge on 01/16/2023 13:03:50 -------------------------------------------------------------------------------- Patient/Caregiver Education Details Patient Name: Date of Service: [name], [name] Fredda Hammed 3/7/2024andnbsp1:00 PM Medical Record Number: [id number] Patient Account Number: [id number] Date of Birth/Gender:  Treating RN: Nov 28, 1945 (77 y.o. [name] Primary Care Physician: [name] Other Clinician: Referring Physician: Treating Physician/Extender: RO BSO Delane Ginger, [place] EL Hubbard Hartshorn in Treatment: 7 Education Assessment Education Provided To: Patient and Caregiver Education Topics Provided Offloading: Handouts: How Offloading Helps Foot Wounds Heal Methods: Explain/Verbal Responses: State content correctly Pressure: Handouts: Pressure Injury: Prevention and Offloading Methods: Explain/Verbal Responses: State content correctly Wound/Skin  Impairment: Handouts: Caring for Your Ulcer Methods: Explain/Verbal Responses: State content correctly Electronic Signature(s) Signed: 01/16/2023 4:59:51 PM By: Levora Dredge [name], [name] 4:59:51 PM By: Levora Dredge Signed: 01/16/2023 [name] ([id number]) [id number] Page 8 of 9 Entered By: Levora Dredge on 01/16/2023 13:59:31 -------------------------------------------------------------------------------- Wound Assessment Details Patient Name: Date of Service: [name], [name] 01/16/2023 1:00 PM Medical Record Number: [id number] Patient Account Number: [id number] Date of Birth/Sex: Treating RN: [birth year] (77 y.o. [name] Primary Care Isabeau Mccalla: [name] Other Clinician: Referring Yetzali Weld: Treating Brihany Butch/Extender: RO BSO N, MICHA EL Hubbard Hartshorn in Treatment: 7 Wound Status Wound Number: 5 Primary Pressure Ulcer Etiology: Wound Location: Left Calcaneus Wound Open Wounding Event: Pressure Injury Status: Date Acquired: 10/20/2022 Comorbid Coronary Artery Disease, Hypertension, Type I Diabetes, Weeks Of Treatment: 7 History: Osteoarthritis, Received Chemotherapy Clustered Wound: No Photos Wound Measurements Length: (cm) 2.2 Width: (cm) 1.2 Depth: (cm) 0.2 Area: (cm) 2.073 Volume: (cm) 0.415 % Reduction in Area: -340.1% % Reduction in Volume: -783% Epithelialization: Small (1-33%) Tunneling: No Undermining: No Wound Description Classification: Category/Stage III Exudate Amount: Medium Exudate Type: Serosanguineous Exudate Color: red, brown Foul Odor After Cleansing: No Slough/Fibrino Yes Wound Bed Granulation Amount: Small (1-33%) Exposed Structure Granulation Quality: Red, Pink Fascia Exposed: No Necrotic Amount: Large (67-100%) Fat Layer (Subcutaneous Tissue) Exposed: Yes Necrotic Quality: Adherent Slough Tendon Exposed: No Muscle Exposed: No Joint Exposed: No Bone Exposed: No Electronic  Signature(s) Signed: 01/30/2023 1:24:46 PM By: Levora Dredge Previous Signature: 01/16/2023 4:59:51 PM Version By: Levora Dredge Entered By: Levora Dredge on 01/30/2023 13:24:45 [name], [name] ([id number]) [id number] Page 9 of 9 -------------------------------------------------------------------------------- Vitals Details Patient Name: Date of Service: [name], [name] 01/16/2023 1:00 PM Medical Record Number: [id number] Patient Account Number: [id number] Date of Birth/Sex: Treating RN: [birth year] (77 y.o. [name] Primary Care Burney Calz[place]illa: [name] Other Clinician: Referring Kendryck Lacroix: Treating Zaniya Mcaulay/Extender: RO BSO N, MICHA EL Hubbard Hartshorn in Treatment: 7 Vital Signs Time Taken: 13:01 Temperature (F): 97.5 Height (in): 69 Pulse (bpm): 60 Weight (lbs): 190 Respiratory Rate (breaths/min): 18 Body Mass Index (BMI): 28.1 Blood Pressure (mmHg): 116/71 Reference Range: 80 - 120 mg / dl Electronic Signature(s) Signed: 01/16/2023 4:59:51 PM By: Levora Dredge Entered By: Levora Dredge on 01/16/2023 13:03:44

## 2023-01-22 DIAGNOSIS — I110 Hypertensive heart disease with heart failure: Secondary | ICD-10-CM | POA: Diagnosis not present

## 2023-01-22 DIAGNOSIS — L89620 Pressure ulcer of left heel, unstageable: Secondary | ICD-10-CM | POA: Diagnosis not present

## 2023-01-22 DIAGNOSIS — J439 Emphysema, unspecified: Secondary | ICD-10-CM | POA: Diagnosis not present

## 2023-01-22 DIAGNOSIS — I951 Orthostatic hypotension: Secondary | ICD-10-CM | POA: Diagnosis not present

## 2023-01-22 DIAGNOSIS — E1065 Type 1 diabetes mellitus with hyperglycemia: Secondary | ICD-10-CM | POA: Diagnosis not present

## 2023-01-22 DIAGNOSIS — L89610 Pressure ulcer of right heel, unstageable: Secondary | ICD-10-CM | POA: Diagnosis not present

## 2023-01-22 DIAGNOSIS — C679 Malignant neoplasm of bladder, unspecified: Secondary | ICD-10-CM | POA: Diagnosis not present

## 2023-01-27 NOTE — Progress Notes (Unsigned)
Patient ID: [name], male   DOB: May 29, 1946, 77 y.o.   MRN: [id number] Cardiology Office Note  Date:  01/28/2023   ID:  [name], DOB January 19, 1946, MRN [id number]  PCP:  [name], MD   Chief Complaint  Patient presents with   2 month follow up     "Doing well." Medications reviewed by the patient verbally.     HPI:  Mr. [name] is a very pleasant 77 year old gentleman with a history of coronary artery disease,  occluded LAD with stent placed February 2008 ( Cypher 3.0 x 8 mm DES stent),   previous angina was described as posterior neck pain.    diabetes type II with complications hypertension  Former smoker, quit late 65s bladder cancer,08/2017 finished chemo  Lymphedema lower extremities who presents for routine followup of his coronary artery disease, DVT November 2023  Last seen in clinic by myself jan 2024  In follow-up today reports he is getting stronger Walking without a wheelchair, without supports Denies significant knee pain Remains on suppressive antibiotics for knee infection followed by ID  Off eliquis given prior history of DVT Back on entresto Denies significant orthostasis symptoms Feels his breathing is stable, continues to have lower extremity swelling, wears compression hose  EKG personally reviewed by myself on todays visit Normal sinus rhythm rate 98 bpm PVCs no significant ST-T wave changes  Hospitalized between 10/02/22-10/14/22 and underwent washout and debridement X2 and was discharged on Iv daptomycin to SNF.   purulent drainage from the surgical sites noted by orthopedics in follow-up -readmitted to the hospital on 10/21/22 and underwent arthroscopic washout of the left knee on the same day. Culture of the synovial fluid was  MRSA 11/22 washout MRSA- MIC vanco 1 10/07/22 washout MRSA 10/21/22 washout MRSA-- the vancomycin MIC had increased to 2 from 1 before .  Added Ceftaroline  as dual MRSA coverage on  10/25/22 Daptomycin MIC was sent to [facility] and reported as 3 which was resistant. Ceftaroline MIC was susceptible at < 1 DC dapto and started vanco on 11/01/22 along with ceftaroline Unable to use rifampin instead because he was on eliquis 4- weeks of IV antibiotic from starting ceftaroline ( 12/15) - until 11/25/22-  Echocardiogram October 05, 2022 ejection fraction estimated 35 to 40% TEE 11/23 Left ventricular ejection fraction, by estimation, is 35 to 40%.,  No endocarditis  October 03, 2022 nonocclusive thrombus seen in the left calf peroneal vein November 14, 2022 Short segment occlusive superficial thrombophlebitis involving mid humeral aspect of basilic vein right upper extremity (picc)  Presents today in a wheelchair, has appreciated leg weakness, difficulty walking, leg swelling Some abdominal fullness Currently not on Lasix Blood loss anemia, hemoglobin into the 7 range now trending back upwards, greater than 10  Entresto, carvedilol started in the hospital in the setting of depressed ejection fraction, these were held for hypotension Was discharged on midodrine 10 twice daily  Reports blood pressure 1 [id number] systolic, with movement sometimes up to [id number] systolic  Past medical history completed treatments for bladder cancer,   Uses insulin pump Followed by endocrine   previous angina was described as posterior neck pain.   On previous visits, he reported having night sweats.  We held his carvedilol and Lipitor. Night sweats did not improve. told he had infection in his gums and has had significant work done.  night sweats have improved       Cardiac catheter report from 2008 details 25% ostial left main, 100%  mid LAD with stent placed at this location,40% proximal circumflex, 20% PL vessel disease Previous stress test showing scar in the mid to distal anterior wall. Previous lab work; Hemoglobin A1c 8.2, total cholesterol 134, LDL 65  PMH:   has a past medical history of  Arthritis, Chronic constipation, Coronary artery disease, Edema of both lower extremities, GERD (gastroesophageal reflux disease), History of bladder cancer (2018), History of diabetic ketoacidosis, History of DVT of lower extremity, History of kidney stones, History of MI (myocardial infarction) (12/2006), History of nonmelanoma skin cancer, Hyperlipidemia, Hypertension, Insulin dependent type 1 diabetes mellitus ([place]), Insulin pump in place, Ischemic cardiomyopathy (12/2006), Nonproliferative retinopathy due to secondary diabetes [facility]), Peripheral vascular disease ([place]), Rupture of flexor tendon of finger, S/P drug eluting coronary stent placement (12/17/2006), and Urethral stricture in male.  PSH:    Past Surgical History:  Procedure Laterality Date   CATARACT EXTRACTION W/PHACO Left 04/18/2020   Procedure: CATARACT EXTRACTION PHACO AND INTRAOCULAR LENS PLACEMENT (IOC) LEFT DIABETIC 14.60  01:57.1;  Surgeon: Birder Robson, MD;  Location: [facility];  Service: Ophthalmology;  Laterality: Left;  Diabetic - insulin pump   CATARACT EXTRACTION W/PHACO Right 10/10/2020   Procedure: CATARACT EXTRACTION PHACO AND INTRAOCULAR LENS PLACEMENT (IOC) RIGHT DIABETIC 12.59 01:44.7;  Surgeon: Birder Robson, MD;  Location: [facility];  Service: Ophthalmology;  Laterality: Right;  Diabetic - insulin pump   COLONOSCOPY WITH PROPOFOL N/A 02/18/2018   Procedure: COLONOSCOPY WITH PROPOFOL;  Surgeon: Toledo, Benay Pike, MD;  Location: [facility];  Service: Gastroenterology;  Laterality: N/A;   CORONARY ANGIOPLASTY WITH STENT PLACEMENT  12/17/2006   @[facility] ;   MI---  PCI w/ DES x2 to occluded LAD, ef 30%  (scanned in epic, under media)   CYSTOSCOPY WITH BIOPSY N/A 10/21/2017   Procedure: CYSTOSCOPY WITH BLADDER BIOPSY;  Surgeon: Royston Cowper, MD;  Location: [facility];  Service: Urology;  Laterality: N/A;   CYSTOSCOPY WITH BIOPSY N/A 12/08/2018   Procedure: CYSTOSCOPY WITH BLADDER BIOPSY-MITOMYCIN;   Surgeon: Royston Cowper, MD;  Location: [facility];  Service: Urology;  Laterality: N/A;   CYSTOSCOPY WITH DIRECT VISION INTERNAL URETHROTOMY  03/02/2020   Procedure: CYSTOSCOPY WITH DIRECT VISION INTERNAL URETHROTOMY;  Surgeon: Royston Cowper, MD;  Location: [facility];  Service: Urology;;   CYSTOSCOPY WITH DIRECT VISION INTERNAL URETHROTOMY N/A 09/14/2020   Procedure: CYSTOSCOPY WITH DIRECT VISION INTERNAL URETHROTOMY WITH HOLM LASER;  Surgeon: Royston Cowper, MD;  Location: [facility];  Service: Urology;  Laterality: N/A;   CYSTOSCOPY WITH DIRECT VISION INTERNAL URETHROTOMY N/A 11/29/2021   Procedure: CYSTOSCOPY WITH DIRECT VISION INTERNAL URETHROTOMY OPTILUME;  Surgeon: Royston Cowper, MD;  Location: [facility];  Service: Urology;  Laterality: N/A;   CYSTOSCOPY WITH HOLMIUM LASER LITHOTRIPSY N/A 05/08/2020   Procedure: CYSTOSCOPY WITH HOLMIUM LASER LITHOTRIPSY;  Surgeon: Royston Cowper, MD;  Location: [facility];  Service: Urology;  Laterality: N/A;   EXCISION MASS LOWER EXTREMETIES Right 06/05/2016   Procedure: EXCISION OF LESION RIGHT LOWER LEG;  Surgeon: Hubbard Robinson, MD;  Location: [facility];  Service: General;  Laterality: Right;   FLEXOR TENDON REPAIR Left 08/06/2022   Procedure: Left middle finger flexor tendon reconstruction stage 1;  Surgeon: Orene Desanctis, MD;  Location: [facility];  Service: Orthopedics;  Laterality: Left;   GREEN LIGHT LASER TURP (TRANSURETHRAL RESECTION OF PROSTATE N/A 08/12/2019   Procedure: GREEN LIGHT LASER TURP (TRANSURETHRAL RESECTION OF PROSTATE, BLADDER BIOPSY;  Surgeon: Royston Cowper, MD;  Location: [facility];  Service: Urology;  Laterality: N/A;   HOLMIUM LASER APPLICATION  0000000   Procedure: HOLMIUM LASER APPLICATION;  Surgeon: Royston Cowper, MD;  Location: [facility];  Service: Urology;;  Urethral stone    INCISION AND DRAINAGE Left 10/07/2022   Procedure: ARTHROSCOPIC INCISION AND DRAINAGE;  Surgeon: Corky Mull, MD;   Location: [facility];  Service: Orthopedics;  Laterality: Left;   KNEE ARTHROSCOPY Left 10/03/2022   Procedure: EXTENSIVE ARTHROSCOPIC DEBRIDMENT WITH PARTIAL MENISECTOMY;  Surgeon: Corky Mull, MD;  Location: [facility];  Service: Orthopedics;  Laterality: Left;   KNEE ARTHROSCOPY Left 10/21/2022   Procedure: ARTHROSCOPIC INCISION AND DRAINAGE KNEE-LEFT;  Surgeon: Corky Mull, MD;  Location: [facility];  Service: Orthopedics;  Laterality: Left;   SKIN CANCER EXCISION     chest   TEE WITHOUT CARDIOVERSION N/A 10/07/2022   Procedure: TRANSESOPHAGEAL ECHOCARDIOGRAM (TEE);  Surgeon: Kate Sable, MD;  Location: [facility];  Service: Cardiovascular;  Laterality: N/A;   TRANSURETHRAL RESECTION OF BLADDER TUMOR N/A 07/15/2017   Procedure: TRANSURETHRAL RESECTION OF BLADDER TUMOR (TURBT);  Surgeon: Royston Cowper, MD;  Location: [facility];  Service: Urology;  Laterality: N/A;   URETERAL BIOPSY N/A 08/12/2019   Procedure: URETERAL BIOPSY;  Surgeon: Royston Cowper, MD;  Location: [facility];  Service: Urology;  Laterality: N/A;   URETHROTOMY N/A 05/08/2020   Procedure: CYSTOSCOPY/URETHROTOMY;  Surgeon: Royston Cowper, MD;  Location: [facility];  Service: Urology;  Laterality: N/A;    Current Outpatient Medications  Medication Sig Dispense Refill   aspirin EC 81 MG tablet Take 81 mg by mouth daily.     atorvastatin (LIPITOR) 40 MG tablet TAKE 1 TABLET(40 MG) BY MOUTH DAILY 90 tablet 0   Blood Glucose Monitoring Suppl (ACCU-CHEK GUIDE ME) w/Device KIT See admin instructions.     Cysteamine Bitartrate (PROCYSBI) 300 MG PACK Use 1 each 4 (four) times daily Use as instructed.     docusate sodium (COLACE) 100 MG capsule Take 2 capsules (200 mg total) by mouth 2 (two) times daily. (Patient taking differently: Take 100 mg by mouth daily as needed for moderate constipation.) 120 capsule 3   doxycycline (ADOXA) 100 MG tablet Take 1 tablet (100 mg total) by mouth 2 (two) times daily. 60 tablet 1   furosemide  (LASIX) 20 MG tablet Take 20 mg by mouth every other day.     glucagon 1 MG injection Inject 1 mg into the skin once as needed (for low blood sugar).      Hyoscyamine Sulfate SL (LEVSIN/SL) 0.125 MG SUBL Place 0.125-0.25 mg under the tongue every 4 (four) hours as needed (bladder spasms). 1-2 TABS 40 tablet 3   insulin aspart (NOVOLOG) 100 UNIT/ML injection Inject 6 Units into the skin 3 (three) times daily with meals. 10 mL 11   insulin aspart (NOVOLOG) 100 UNIT/ML injection Inject 0-20 Units into the skin 3 (three) times daily with meals. CBG < 70:Implement Hypoglycemia Standing Orders and refer to Hypoglycemia Standing Orders sidebar report CBG 70 - 120: 0 units  CBG 121 - 150: 3 units  CBG 151 - 200: 4 units  CBG 201 - 250: 7 units  CBG 251 - 300: 11 units  CBG 301 - 350: 15 units  CBG 351 - 400: 20 units  CBG > 400 call MD and obtain STAT lab verification 10 mL 11   midodrine (PROAMATINE) 10 MG tablet Take 0.5 tablets (5 mg total) by mouth 2 (two) times daily with a meal. For one  week then discontinue 30 tablet 0   Multiple Vitamin (MULTIVITAMIN WITH MINERALS) TABS tablet Take 1 tablet by mouth daily.     naproxen sodium (ALEVE) 220 MG tablet Take 220-440 mg by mouth 2 (two) times daily as needed (pain/headaches.).     omeprazole (PRILOSEC) 20 MG capsule Take 1 capsule (20 mg total) by mouth daily. 30 capsule 0   ondansetron (ZOFRAN-ODT) 4 MG disintegrating tablet Take 1 tablet (4 mg total) by mouth every 8 (eight) hours as needed for nausea or vomiting. 20 tablet 0   potassium chloride SA (KLOR-CON M) 20 MEQ tablet Take 20 mEq by mouth daily.     sacubitril-valsartan (ENTRESTO) 24-26 MG Take 1 tablet by mouth 2 (two) times daily. 60 tablet 3   vitamin B-12 (CYANOCOBALAMIN) 1000 MCG tablet Take 1,000 mcg by mouth every other day.     insulin glargine-yfgn (SEMGLEE) 100 UNIT/ML injection Inject 0.28 mLs (28 Units total) into the skin daily. (Patient not taking: Reported on 11/27/2022) 10 mL  11   Meth-Hyo-M Bl-Na Phos-Ph Sal (URIBEL) 118 MG CAPS Take 1 capsule (118 mg total) by mouth every 6 (six) hours as needed (dysuria). (Patient not taking: Reported on 11/27/2022) 40 capsule 3   oxyCODONE (OXY IR/ROXICODONE) 5 MG immediate release tablet Take 1 tablet (5 mg total) by mouth every 6 (six) hours as needed for severe pain. (Patient not taking: Reported on 11/27/2022) 30 tablet 0   No current facility-administered medications for this visit.     Allergies:   Patient has no known allergies.   Social History:  The patient  reports that he quit smoking about 27 years ago. His smoking use included cigarettes. He has a 10.00 pack-year smoking history. He has never used smokeless tobacco. He reports that he does not drink alcohol and does not use drugs.   Family History:   family history includes Heart attack in his mother; Heart disease in his father and mother; Prostate cancer in his brother.   Review of Systems: Review of Systems  Constitutional: Negative.   Respiratory: Negative.    Cardiovascular: Negative.   Gastrointestinal: Negative.   Musculoskeletal: Negative.   Neurological: Negative.   Psychiatric/Behavioral: Negative.    All other systems reviewed and are negative.   PHYSICAL EXAM: VS:  BP (!) 102/50 (BP Location: Left Arm, Patient Position: Sitting, Cuff Size: Normal)   Pulse 98   Ht 5\' 9"  (1.753 m)   Wt 170 lb (77.1 kg)   SpO2 98%   BMI 25.10 kg/m  , BMI Body mass index is 25.1 kg/m. Constitutional:  oriented to person, place, and time. No distress.  HENT:  Head: Grossly normal Eyes:  no discharge. No scleral icterus.  Neck: No JVD, no carotid bruits  Cardiovascular: Regular rate and rhythm, no murmurs appreciated Pulmonary/Chest: Clear to auscultation bilaterally, no wheezes or rails Abdominal: Soft.  no distension.  no tenderness.  Musculoskeletal: Normal range of motion Neurological:  normal muscle tone. Coordination normal. No atrophy Skin: Skin  warm and dry Psychiatric: normal affect, pleasant  Recent Labs: 10/26/2022: B Natriuretic Peptide 542.4 11/07/2022: Magnesium 1.9 12/26/2022: ALT 17; BUN 23; Creatinine, Ser 0.82; Hemoglobin 10.3; Platelets 384; Potassium 4.1; Sodium 136    Lipid Panel Lab Results  Component Value Date   CHOL 101 06/08/2018   HDL 57 06/08/2018   LDLCALC 37 06/08/2018   TRIG 34 06/08/2018      Wt Readings from Last 3 Encounters:  01/28/23 170 lb (77.1 kg)  12/26/22 171  lb 1.6 oz (77.6 kg)  11/27/22 187 lb (84.8 kg)     ASSESSMENT AND PLAN:  Atherosclerosis of native coronary artery of native heart without angina pectoris -  Known CAD,  stent x2  in the LAD, in 2008  mild circumflex and RCA calcification -Cholesterol at goal,  current non-smoker Low ejection fraction, 35 to 40%, previously declined cardiac catheterization   Septic joint Completing treatment for MRSA following joint replacement Weaker, working on rehab Followed by ID, inflammatory marker is being monitored  HYPERTENSION, BENIGN - Plan: EKG 12-Lead Blood pressure is stabilized with improved treatment of his bacteremia Will wean midodrine down to 5 mg twice daily for 1 week, if blood pressure stable will wean off the midodrine  Cardiomyopathy Off midodrine, back on Entresto 24/26 mg twice daily Given low blood pressure we will not restart Coreg, will start metoprolol succinate 12.5 daily He is scheduled to meet with endocrine next week, recommend he consider Jardiance/Farxiga He does report having prior history of urinary tract infections Continue current dose of Lasix, In follow-up consider adding spironolactone 12.5 mg daily  Hyperlipidemia Continue Lipitor, goal LDL less than 70  Anemia Anemia after knee surgery, hemoglobin around 10, slowly improving  Controlled type 2 diabetes mellitus with other circulatory complication, without long-term current use of insulin ([name]) Followed by endocrinology Previously on  insulin pump, reports having significant weight loss over the past 6 months  Lymphedema Wears compressions Avoid calcium channel blockers Continue compression hose, leg elevation   Total encounter time more than 40 minutes  Greater than 50% was spent in counseling and coordination of care with the patient   Signed, Esmond Plants, M.D., Ph.D. 01/28/2023  [place], [place]

## 2023-01-28 ENCOUNTER — Ambulatory Visit: Payer: Medicare HMO | Attending: Cardiovascular Disease | Admitting: Cardiovascular Disease

## 2023-01-28 ENCOUNTER — Encounter: Payer: Self-pay | Admitting: Cardiovascular Disease

## 2023-01-28 VITALS — BP 102/50 | HR 98 | Ht 69.0 in | Wt 170.0 lb

## 2023-01-28 DIAGNOSIS — R600 Localized edema: Secondary | ICD-10-CM

## 2023-01-28 DIAGNOSIS — E782 Mixed hyperlipidemia: Secondary | ICD-10-CM

## 2023-01-28 DIAGNOSIS — I10 Essential (primary) hypertension: Secondary | ICD-10-CM

## 2023-01-28 DIAGNOSIS — E1159 Type 2 diabetes mellitus with other circulatory complications: Secondary | ICD-10-CM | POA: Diagnosis not present

## 2023-01-28 DIAGNOSIS — I25118 Atherosclerotic heart disease of native coronary artery with other forms of angina pectoris: Secondary | ICD-10-CM | POA: Diagnosis not present

## 2023-01-28 DIAGNOSIS — I951 Orthostatic hypotension: Secondary | ICD-10-CM

## 2023-01-28 MED ORDER — MIDODRINE HCL 10 MG PO TABS: 10.0000 mg | ORAL_TABLET | Freq: Three times a day (TID) | ORAL | 3 refills | Status: AC | PRN

## 2023-01-28 MED ORDER — METOPROLOL SUCCINATE ER 25 MG PO TB24: 12.5000 mg | ORAL_TABLET | Freq: Every day | ORAL | 3 refills | Status: AC

## 2023-01-28 NOTE — Patient Instructions (Addendum)
Medication Instructions:  Ask endocrine about Jardiance or Wilder Glade for cardiac benefit  Start metoprolol succinate 12.5 mg daily  If you need a refill on your cardiac medications before your next appointment, please call your pharmacy.   Lab work: No new labs needed  Testing/Procedures: No new testing needed  Follow-Up: At [facility], you and your health needs are our priority.  As part of our continuing mission to provide you with exceptional heart care, we have created designated Provider Care Teams.  These Care Teams include your primary Cardiologist (physician) and Advanced Practice Providers (APPs -  Physician Assistants and Nurse Practitioners) who all work together to provide you with the care you need, when you need it.  You will need a follow up appointment in 3 months  Providers on your designated Care Team:   Murray Hodgkins, NP Christell Faith, [name] Cadence Kathlen Mody, [place]  COVID-19 Vaccine Information can be found at: [email or web address] For questions related to vaccine distribution or appointments, please email [email or web address] or call [phone].

## 2023-01-29 ENCOUNTER — Other Ambulatory Visit: Payer: Self-pay | Admitting: Cardiovascular Disease

## 2023-01-29 NOTE — Addendum Note (Signed)
Addended by: Michel Santee on: 01/29/2023 04:19 PM   Modules accepted: Orders

## 2023-01-30 ENCOUNTER — Encounter: Payer: Medicare HMO | Admitting: Physician Assistant

## 2023-01-30 DIAGNOSIS — L89623 Pressure ulcer of left heel, stage 3: Secondary | ICD-10-CM | POA: Diagnosis not present

## 2023-01-30 DIAGNOSIS — E1051 Type 1 diabetes mellitus with diabetic peripheral angiopathy without gangrene: Secondary | ICD-10-CM | POA: Diagnosis not present

## 2023-01-30 DIAGNOSIS — I5042 Chronic combined systolic (congestive) and diastolic (congestive) heart failure: Secondary | ICD-10-CM | POA: Diagnosis not present

## 2023-01-30 DIAGNOSIS — I110 Hypertensive heart disease with heart failure: Secondary | ICD-10-CM | POA: Diagnosis not present

## 2023-01-30 DIAGNOSIS — L89613 Pressure ulcer of right heel, stage 3: Secondary | ICD-10-CM | POA: Diagnosis not present

## 2023-01-30 DIAGNOSIS — M720 Palmar fascial fibromatosis [Dupuytren]: Secondary | ICD-10-CM | POA: Diagnosis not present

## 2023-01-30 DIAGNOSIS — L98412 Non-pressure chronic ulcer of buttock with fat layer exposed: Secondary | ICD-10-CM | POA: Diagnosis not present

## 2023-01-30 DIAGNOSIS — L97422 Non-pressure chronic ulcer of left heel and midfoot with fat layer exposed: Secondary | ICD-10-CM | POA: Diagnosis not present

## 2023-01-30 DIAGNOSIS — E10621 Type 1 diabetes mellitus with foot ulcer: Secondary | ICD-10-CM | POA: Diagnosis not present

## 2023-01-31 NOTE — Progress Notes (Addendum)
JAVIN, PALMS ([id number]) [id number] Page 1 of 9 Visit Report for 01/30/2023 Chief Complaint Document Details Patient Name: Date of Service: [name], [name] 01/30/2023 1:00 PM Medical Record Number: [id number] Patient Account Number: [id number] Date of Birth/Sex: Treating RN: 28-Mar-1946 (77 y.o. [name] Primary Care Provider: [name] Other Clinician: Referring Provider: Treating Provider/Extender: [name] in Treatment: 9 Information Obtained from: Patient Chief Complaint Bilateral heel pressure ulcers and sacral pressure ulcer Electronic Signature(s) Signed: 01/30/2023 1:19:42 PM By: [name] Entered By: Worthy Keeler on 01/30/2023 13:19:42 -------------------------------------------------------------------------------- Debridement Details Patient Name: Date of Service: [name], [name] 01/30/2023 1:00 PM Medical Record Number: [id number] Patient Account Number: [id number] Date of Birth/Sex: Treating RN: Nov 28, 1945 (76 y.o. [name] Primary Care Provider: [name] Other Clinician: Referring Provider: Treating Provider/Extender: [name] in Treatment: 9 Debridement Performed for Assessment: Wound #5 Left Calcaneus Performed By: Physician [name], [name] Debridement Type: Debridement Level of Consciousness (Pre-procedure): Awake and Alert Pre-procedure Verification/Time Out Yes - 13:40 Taken: Start Time: 13:40 Pain Control: Lidocaine 4% T opical Solution T Area Debrided (L x W): otal 1.8 (cm) x 1.2 (cm) = 2.16 (cm) Tissue and other material debrided: Viable, Non-Viable, Slough, Subcutaneous, Slough Level: Skin/Subcutaneous Tissue Debridement Description: Excisional Instrument: Curette Bleeding: Minimum Hemostasis Achieved: Pressure Response to Treatment: Procedure was tolerated well Level of Consciousness (Post- Awake and  Alert procedure): ALMOND, FRIES ([id number] Page 2 of 9 Post Debridement Measurements of Total Wound Length: (cm) 1.8 Stage: Category/Stage III Width: (cm) 1.2 Depth: (cm) 0.4 Volume: (cm) 0.679 Character of Wound/Ulcer Post Debridement: Stable Post Procedure Diagnosis Same as Pre-procedure Electronic Signature(s) Signed: 01/30/2023 3:58:45 PM By: [name] Signed: 01/30/2023 5:06:45 PM By: [name] Entered By: [name] on 01/30/2023 13:43:21 -------------------------------------------------------------------------------- HPI Details Patient Name: Date of Service: [name], [name] 01/30/2023 1:00 PM Medical Record Number: [id number] Patient Account Number: [id number] Date of Birth/Sex: Treating RN: November 11, 1946 (77 y.o. [name] Primary Care Provider: [name] Other Clinician: Referring Provider: Treating Provider/Extender: [name] in Treatment: 9 History of Present Illness Location: right anterior shin Quality: Patient reports No Pain. Severity: Patient states wound are getting worse. Duration: Patient has had the wound for > 6 months prior to seeking treatment at the wound center Context: The wound appeared gradually over time Modifying Factors: Other treatment(s) tried include: taken doxycycline and use bacitracin ssociated Signs and Symptoms: Patient reports presence of swelling A HPI Description: 77 year old male recently seen by his PCP Dr. Tawnya [name] who saw him for a wound on the right leg which is less tender and he is continuing to use bacitracin and has completed her course of doxycycline. Past medical history of coronary artery disease, diabetes mellitus type 1, hyperlipidemia, hypertension, MI, plantar fascial fibromatosis, pneumonia, status post foot surgery due to trauma on the left side, coronary angioplasty. he is a former smoker and quit in  July 1996. last hemoglobin A1c was 7.8 % in April of this year 5/22 2017 -- biopsy of the right lower extremity was sent for pathology and the report is that of a basal cell carcinoma with edges of the biopsy are involved. I have called the patient over the phone( 03/28/16) and discussed the pathology report with him and he is agreeable to see a surgeon for excision and need full. I have also spoken personally to Dr. Dahlia [name], of [facility] and referred the patient for further wide excision of this lesion and have communicated this to the patient. READMISSION 08/12/18 This is a now 77 year old man with type 1 diabetes. Hemoglobin A1c on 4/29 was 7.9. He is listed as having a history of PAD in the [facility] records although the patient doesn't recall this, doesn't complain of any symptoms of claudication and doesn't believe he has had any prior noninvasive arterial test. He is a nonsmoker. He was here in 2017 with a wound on his Right lower leg. This was biopsied in the clinic and pathology showed a basal cell carcinoma. He went on to have surgery on this area this is currently where I believe his current wounds are located. He generally bumped the lateral part of his right calf 2 weeks ago and has a superficial abrasion. He also has 2 small open areas on the medial part of the tibia in the same area. Our intake nurse noted a stitch coming out of this which was removed. These of the wound sees actually come to the clinic for. However the more concerning area is an area on the tip of the left great toe. He says that this was initially traumatic and he's been to see Dr. [name] podiatrist. Darral Dash been using what I think is Santyl to the wound tip. This has some depth. ABIs in this clinic were non-obtainable on the right and 1.84 on the left. 08/19/18; the patient had arterial studies that did not show evidence of significant hemodynamically compromising occlusions in either leg. There was mild medial  atherosclerosis bilaterally. His resting ABIs were within the normal limits at 1.3 on the right and 1.4 on the left. There was normal posterior and anterior tibial artery waveforms and velocities listed on both sides. The patient arrives with what appears to be a healthy surface over the tip of his left great toe. This is indeed surprising. Still looks somewhat friable however. More concerning is the x-ray that we did that showed erosions of the tuft of the distal phalanx the left great toe consistent with osteomyelitis. I attempted to pull this x-ray up on colon health Link however I can't open the film to look at this myself. SHAINE, ENYEART ([id number]) [id number] Page 3 of 9 08/26/18; I reviewed the patient's x-ray and colon helpline. Indeed there is fairly obvious erosion of the tip of his left great toe. The wound was initially a traumatic area hitting it sometime in the middle of night in his kitchen. He is a type I diabetic. I have him on Flagyl and Levaquin that I started last week i.e. 7 days ago. He has an appointment with infectious disease on 09/01/18 09/02/18; the patient was seen by Dr. Delaine Lame of infectious disease. She did not feel the patient needed IV antibiotics. I do feel he needs further oral antibiotics however. He does not need anymore Flagyl but he does need another 2 weeks of Levaquin. The areas just about closed. 09/16/18; the patient is completing his Levaquin today. There is no open wound on the tip of the toe Readmission: 11-26-2022 upon evaluation today patient presents for initial inspection here in our clinic concerning issues that he has been having after having been hospitalized due to septic arthritis. Subsequently he has a PICC line that is been removed and he has been discharged from infectious disease in that regard. Unfortunately he developed shearing wounds over the gluteal region as well as a pressure ulcer over each heel  although they  are not looking extremely bad nonetheless they do seem to be evidence of pressure which she sustained while hospitalized. Obviously he was very sick and is still recovering as far as that is concerned. Fortunately there does not appear to be any evidence of active infection systemically which is good news at this point according to Dr. Ardyth Gal. Patient's wounds also do not appear to be too bad at this point which is good news. Patient does have a history of type 1 diabetes mellitus, peripheral vascular disease, coronary artery disease, congestive heart failure, hypertension, and he is on long-term anticoagulant therapy. 12-03-2022 upon evaluation today patient appears to be doing better to some degree in regard to his ulcers on his heels at this point. We are still waiting on the actual appointment with the vascular surgeons but nonetheless in the meantime I do believe that he is making some good progress currently with regard to loosen up the eschar. I did actually feel like that there was some ability for [place] to remove some of the necrotic tissue today and I discussed that with the patient. He is in agreement with that plan. We also can probably see about making an adjustment in the treatment regimen. 12-10-2022 upon evaluation today patient's wounds were really doing about the same. Again he is still awaiting the evaluation with the vascular surgeon. That should have already been done but had to be pushed back unfortunately. He will be seeing them next week on the seventh. I plan to see him following somewhere around the end of the week I think will probably be best. The patient voiced understanding. 12-19-2022 upon evaluation today patient appears to be doing well currently in regard to his wound we did get the results of his arterial studies which show that he has excellent ABIs with no signs of vascular compromise. For that reason I will go ahead and perform some debridement today to  clearway the necrotic debris with his Eliquis that I will be too aggressive so this will still be a stepwise process but we are to go ahead and get that started. 12-27-2022 upon evaluation today patient appears to be doing well currently in regard to his wounds. They are actually showing signs of excellent improvement. There is some necrotic tissue noted on the surface of wounds to work on clearing some of that away today also I think that we may switch to [facility] dressing to see how things do going forward. He is in agreement with that plan. Subsequently I am extremely pleased with where we stand currently. No fevers, chills, nausea, vomiting, or diarrhea. 01-02-2023 upon evaluation today patient appears to be doing much better in regard to his wounds the right foot appears to be almost healed the left foot though not completely healed is significantly better. Fortunately there does not appear to be any signs of active infection at this point. 01-10-2023 upon evaluation today patient appears to be doing well currently in regard to his wounds. The right heel actually is completely healed the left heel is definitely headed in the right direction and looks to be doing awesome. I do not see any signs of active infection locally nor systemically at this time which is great news. 3/7; his right heel has remained healed. There is no evidence of active infection. The remaining wound is on the lateral aspect of the heel just above the plantar surface 01-30-2023 upon evaluation today patient appears to be doing well currently in regard to his wound. The  heel actually showing signs of improvement which is great news and fortunately I do not see any evidence of active infection locally nor systemically at this time. Overall I think that we are headed in the right direction which is excellent here. No fevers, chills, nausea, vomiting, or diarrhea. I am good have to perform some sharp debridement today. This is  just the left heel that is remaining open. Electronic Signature(s) Signed: 01/30/2023 1:47:41 PM By: [name] Entered By: Worthy Keeler on 01/30/2023 13:47:41 -------------------------------------------------------------------------------- Physical Exam Details Patient Name: Date of Service: [name], [name] 01/30/2023 1:00 PM Medical Record Number: [id number] Patient Account Number: [id number] Date of Birth/Sex: Treating RN: Dec 25, 1945 (77 y.o. [name] Primary Care Provider: [name] Other Clinician: Referring Provider: Treating Provider/Extender: [name] in Treatment: 11 Constitutional Well-nourished and well-hydrated in no acute distress. Respiratory normal breathing without difficulty. IVYN, FROGGATT ([id number]) [id number] Page 4 of 9 Psychiatric this patient is able to make decisions and demonstrates good insight into disease process. Alert and Oriented x 3. pleasant and cooperative. Notes Upon inspection patient's wound bed actually showed signs of good granulation and epithelization at this point. Fortunately I do not see any evidence of infection locally nor systemically which is great news and overall I do believe that we are headed in the right direction. No fevers, chills, nausea, vomiting, or diarrhea. Electronic Signature(s) Signed: 01/30/2023 1:47:52 PM By: [name] Entered By: Worthy Keeler on 01/30/2023 13:47:52 -------------------------------------------------------------------------------- Physician Orders Details Patient Name: Date of Service: [name], [name] 01/30/2023 1:00 PM Medical Record Number: [id number] Patient Account Number: [id number] Date of Birth/Sex: Treating RN: [birth year] (77 y.o. 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 - home health sees patient weekly. Return A Nurse Visit as needed [facility] for wound care. May utilize formulary equivalent dressing for wound treatment orders unless otherwise specified. Home Health Nurse may visit PRN to address patients wound care needs. Alvis Lemmings fax [phone] [facility] Nurse. PLEASE change dressing on the left heel when you visit unless partner has already changed dressing. Apply Hydrofera blue and cover with a bordered foam dressing. Reapply compression stockings Bathing/ Shower/ Hygiene May shower; gently cleanse wound with antibacterial soap, rinse and pat dry prior to dressing wounds Anesthetic (Use 'Patient Medications' Section for Anesthetic Order Entry) Lidocaine applied to wound bed Edema Control - Lymphedema / Segmental Compressive Device / Other Elevate, Exercise Daily and A void Standing for Long Periods of Time. Elevate legs to the level of the heart and pump ankles as often as possible Elevate leg(s) parallel to the floor when sitting. Wound Treatment Wound #5 - Calcaneus Wound Laterality: Left Cleanser: Soap and Water 1 x Per Day/30 Days Discharge Instructions: Gently cleanse wound with antibacterial soap, rinse and pat dry prior to dressing wounds Grabert, Daniil [name] ([id number])  [id number].pdf Page 5 of 9  Prim Dressing: SILVERCEL Antimicrobial Alginate Dressing, 1x12 (in/in) 1 x Per Day/30 Days ary Discharge Instructions: cut circle to fit in wound bed, do not over stuff, then cut another square to place over circle piece. Secondary Dressing: (BORDER) Zetuvit Plus SILICONE BORDER Dressing 4x4 (in/in) (Generic) 1 x Per Day/30 Days Discharge Instructions: Please do not put silicone bordered dressings under wraps. Use non-bordered dressing only. Electronic Signature(s) Signed: 01/30/2023 3:58:45 PM By: [name] Signed: 01/30/2023 5:06:45 PM By: [name] Entered By: [name] on 01/30/2023 13:44:56 -------------------------------------------------------------------------------- Problem List Details Patient Name: Date of Service: [name], [name] 01/30/2023 1:00 PM Medical Record Number: [id number] Patient Account Number: [id number] Date of Birth/Sex: Treating RN: 10/24/1946 (77 y.o. [name] Primary Care Provider: [name] Other Clinician: Referring Provider: Treating Provider/Extender: [name] in Treatment: 9 Active Problems ICD-10 Encounter Code Description Active Date MDM Diagnosis E10.621 Type 1 diabetes mellitus with foot ulcer 11/26/2022 No Yes I73.89 Other specified peripheral vascular diseases 11/26/2022 No Yes L89.623 Pressure ulcer of left heel, stage 3 11/26/2022 No Yes L89.613 Pressure ulcer of right heel, stage 3 11/26/2022 No Yes L98.412 Non-pressure chronic ulcer of buttock with fat layer exposed 11/26/2022 No Yes I25.10 Atherosclerotic heart disease of native coronary artery without angina pectoris 11/26/2022 No Yes I50.42 Chronic combined systolic (congestive) and diastolic (congestive) heart failure 11/26/2022 No Yes I10 Essential (primary) hypertension 11/26/2022 No Yes Dao, Cornelio [name] ([id number]) [id number] Page 6 of  9 Inactive Problems Resolved Problems Electronic Signature(s) Signed: 01/30/2023 1:19:39 PM By: [name] Entered By: Worthy Keeler on 01/30/2023 13:19:38 -------------------------------------------------------------------------------- Progress Note Details Patient Name: Date of Service: [name], [name] 01/30/2023 1:00 PM Medical Record Number: [id number] Patient Account Number: [id number] Date of Birth/Sex: Treating RN: August 30, 1946 (77 y.o. [name] Primary Care Provider: [name] Other Clinician: Referring Provider: Treating Provider/Extender: [name] in Treatment: 9 Subjective Chief Complaint Information obtained from Patient Bilateral heel pressure ulcers and sacral pressure ulcer History of Present Illness (HPI) The following HPI elements were documented for the patient's wound: Location: right anterior shin Quality: Patient reports No Pain. Severity: Patient states wound are getting worse. Duration: Patient has had the wound for > 6 months prior to seeking treatment at the wound center Context: The wound appeared gradually over time Modifying Factors: Other treatment(s) tried include: taken doxycycline and use bacitracin Associated Signs and Symptoms: Patient reports presence of swelling 77 year old male recently seen by his PCP Dr. Tawnya [name] who saw him for a wound on the right leg which is less tender and he is continuing to use bacitracin and has completed her course of doxycycline. Past medical history of coronary artery disease, diabetes mellitus type 1, hyperlipidemia, hypertension, MI, plantar fascial fibromatosis, pneumonia, status post foot surgery due to trauma on the left side, coronary angioplasty. he is a former smoker and quit in July 1996. last hemoglobin A1c was 7.8 % in April of this year 5/22 2017 -- biopsy of the right lower extremity was sent for pathology and the report is that of a basal  cell carcinoma with edges of the biopsy are involved. I have called the patient over the phone( 03/28/16) and discussed the pathology report with him and he is agreeable to see a surgeon for excision and need full. I have also spoken personally to Dr. Dahlia [name], of [facility] surgical and referred the patient for further wide excision of this lesion and have communicated  this to the patient. READMISSION 08/12/18 This is a now 77 year old man with type 1 diabetes. Hemoglobin A1c on 4/29 was 7.9. He is listed as having a history of PAD in the [facility] records although the patient doesn't recall this, doesn't complain of any symptoms of claudication and doesn't believe he has had any prior noninvasive arterial test. He is a nonsmoker. He was here in 2017 with a wound on his Right lower leg. This was biopsied in the clinic and pathology showed a basal cell carcinoma. He went on to have surgery on this area this is currently where I believe his current wounds are located. He generally bumped the lateral part of his right calf 2 weeks ago and has a superficial abrasion. He also has 2 small open areas on the medial part of the tibia in the same area. Our intake nurse noted a stitch coming out of this which was removed. These of the wound sees actually come to the clinic for. However the more concerning area is an area on the tip of the left great toe. He says that this was initially traumatic and he's been to see Dr. [name] podiatrist. Darral Dash been using what I think is Santyl to the wound tip. This has some depth. ABIs in this clinic were non-obtainable on the right and 1.84 on the left. 08/19/18; the patient had arterial studies that did not show evidence of significant hemodynamically compromising occlusions in either leg. There was mild medial atherosclerosis bilaterally. His resting ABIs were within the normal limits at 1.3 on the right and 1.4 on the left. There was normal posterior and anterior tibial artery  waveforms and velocities listed on both sides. The patient arrives with what appears to be a healthy surface over the tip of his left great toe. This is indeed surprising. Still looks somewhat friable however. More concerning is the x-ray that we did that showed erosions of the tuft of the distal phalanx the left great toe consistent with osteomyelitis. I attempted to pull this x-ray up on colon health Link however I can't open the film to look at this myself. 08/26/18; I reviewed the patient's x-ray and colon helpline. Indeed there is fairly obvious erosion of the tip of his left great toe. The wound was initially a traumatic area hitting it sometime in the middle of night in his kitchen. He is a type I diabetic. I have him on Flagyl and Levaquin that I started last week i.e. [place], [place] ([id number]) [id number].pdf Page 7 of 9 days ago. He has an appointment with infectious disease on 09/01/18 09/02/18; the patient was seen by Dr. Delaine Lame of infectious disease. She did not feel the patient needed IV antibiotics. I do feel he needs further oral antibiotics however. He does not need anymore Flagyl but he does need another 2 weeks of Levaquin. The areas just about closed. 09/16/18; the patient is completing his Levaquin today. There is no open wound on the tip of the toe Readmission: 11-26-2022 upon evaluation today patient presents for initial inspection here in our clinic concerning issues that he has been having after having been hospitalized due to septic arthritis. Subsequently he has a PICC line that is been removed and he has been discharged from infectious disease in that regard. Unfortunately he developed shearing wounds over the gluteal region as well as a pressure ulcer over each heel although they are not looking extremely bad nonetheless they do seem to be evidence of pressure which she  sustained while hospitalized. Obviously he was very sick and is  still recovering as far as that is concerned. Fortunately there does not appear to be any evidence of active infection systemically which is good news at this point according to Dr. Ardyth Gal. Patient's wounds also do not appear to be too bad at this point which is good news. Patient does have a history of type 1 diabetes mellitus, peripheral vascular disease, coronary artery disease, congestive heart failure, hypertension, and he is on long-term anticoagulant therapy. 12-03-2022 upon evaluation today patient appears to be doing better to some degree in regard to his ulcers on his heels at this point. We are still waiting on the actual appointment with the vascular surgeons but nonetheless in the meantime I do believe that he is making some good progress currently with regard to loosen up the eschar. I did actually feel like that there was some ability for [place] to remove some of the necrotic tissue today and I discussed that with the patient. He is in agreement with that plan. We also can probably see about making an adjustment in the treatment regimen. 12-10-2022 upon evaluation today patient's wounds were really doing about the same. Again he is still awaiting the evaluation with the vascular surgeon. That should have already been done but had to be pushed back unfortunately. He will be seeing them next week on the seventh. I plan to see him following somewhere around the end of the week I think will probably be best. The patient voiced understanding. 12-19-2022 upon evaluation today patient appears to be doing well currently in regard to his wound we did get the results of his arterial studies which show that he has excellent ABIs with no signs of vascular compromise. For that reason I will go ahead and perform some debridement today to clearway the necrotic debris with his Eliquis that I will be too aggressive so this will still be a stepwise process but we are to go ahead and get that  started. 12-27-2022 upon evaluation today patient appears to be doing well currently in regard to his wounds. They are actually showing signs of excellent improvement. There is some necrotic tissue noted on the surface of wounds to work on clearing some of that away today also I think that we may switch to [facility] dressing to see how things do going forward. He is in agreement with that plan. Subsequently I am extremely pleased with where we stand currently. No fevers, chills, nausea, vomiting, or diarrhea. 01-02-2023 upon evaluation today patient appears to be doing much better in regard to his wounds the right foot appears to be almost healed the left foot though not completely healed is significantly better. Fortunately there does not appear to be any signs of active infection at this point. 01-10-2023 upon evaluation today patient appears to be doing well currently in regard to his wounds. The right heel actually is completely healed the left heel is definitely headed in the right direction and looks to be doing awesome. I do not see any signs of active infection locally nor systemically at this time which is great news. 3/7; his right heel has remained healed. There is no evidence of active infection. The remaining wound is on the lateral aspect of the heel just above the plantar surface 01-30-2023 upon evaluation today patient appears to be doing well currently in regard to his wound. The heel actually showing signs of improvement which is great news and fortunately I do not  see any evidence of active infection locally nor systemically at this time. Overall I think that we are headed in the right direction which is excellent here. No fevers, chills, nausea, vomiting, or diarrhea. I am good have to perform some sharp debridement today. This is just the left heel that is remaining open. Objective Constitutional Well-nourished and well-hydrated in no acute distress. Vitals Time Taken: 1:08  PM, Height: 69 in, Weight: 190 lbs, BMI: 28.1, Temperature: 97.5 F, Pulse: 99 bpm, Respiratory Rate: 18 breaths/min, Blood Pressure: 114/69 mmHg. Respiratory normal breathing without difficulty. Psychiatric this patient is able to make decisions and demonstrates good insight into disease process. Alert and Oriented x 3. pleasant and cooperative. General Notes: Upon inspection patient's wound bed actually showed signs of good granulation and epithelization at this point. Fortunately I do not see any evidence of infection locally nor systemically which is great news and overall I do believe that we are headed in the right direction. No fevers, chills, nausea, vomiting, or diarrhea. Integumentary (Hair, Skin) Wound #5 status is Open. Original cause of wound was Pressure Injury. The date acquired was: 10/20/2022. The wound has been in treatment 9 weeks. The wound is located on the Left Calcaneus. The wound measures 1.8cm length x 1.2cm width x 0.4cm depth; 1.696cm^2 area and 0.679cm^3 volume. There is Fat Layer (Subcutaneous Tissue) exposed. There is no tunneling or undermining noted. There is a medium amount of serosanguineous drainage noted. There is no granulation within the wound bed. There is a large (67-100%) amount of necrotic tissue within the wound bed including Adherent Slough. Assessment MAANAV, BLINN ([id number]) [id number] Page 8 of 9 Active Problems ICD-10 Type 1 diabetes mellitus with foot ulcer Other specified peripheral vascular diseases Pressure ulcer of left heel, stage 3 Pressure ulcer of right heel, stage 3 Non-pressure chronic ulcer of buttock with fat layer exposed Atherosclerotic heart disease of native coronary artery without angina pectoris Chronic combined systolic (congestive) and diastolic (congestive) heart failure Essential (primary) hypertension Procedures Wound #5 Pre-procedure diagnosis of Wound #5 is a Pressure Ulcer  located on the Left Calcaneus . There was a Excisional Skin/Subcutaneous Tissue Debridement with a total area of 2.16 sq cm performed by [name], [name]. With the following instrument(s): Curette to remove Viable and Non-Viable tissue/material. Material removed includes Subcutaneous Tissue and Slough and after achieving pain control using Lidocaine 4% T opical Solution. No specimens were taken. A time out was conducted at 13:40, prior to the start of the procedure. A Minimum amount of bleeding was controlled with Pressure. The procedure was tolerated well. Post Debridement Measurements: 1.8cm length x 1.2cm width x 0.4cm depth; 0.679cm^3 volume. Post debridement Stage noted as Category/Stage III. Character of Wound/Ulcer Post Debridement is stable. Post procedure Diagnosis Wound #5: Same as Pre-Procedure Plan Follow-up Appointments: Return Appointment in 2 weeks. - home health sees patient weekly. Nurse Visit as needed Home Health: [facility] for wound care. May utilize formulary equivalent dressing for wound treatment orders unless otherwise specified. Home Health Nurse may visit PRN to address patientoos wound care needs. Alvis Lemmings fax [phone] [facility] Nurse. PLEASE change dressing on the left heel when you visit unless partner has already changed dressing. Apply Hydrofera blue and cover with a bordered foam dressing. Reapply compression stockings Bathing/ Shower/ Hygiene: May shower; gently cleanse wound with antibacterial soap, rinse and pat dry prior to dressing wounds Anesthetic (Use 'Patient Medications' Section for Anesthetic Order Entry): Lidocaine applied to wound bed Edema Control - Lymphedema /  Segmental Compressive Device / Other: Elevate, Exercise Daily and Avoid Standing for Long Periods of Time. Elevate legs to the level of the heart and pump ankles as often as possible Elevate leg(s) parallel to the floor when sitting. WOUND #5: - Calcaneus Wound Laterality:  Left Cleanser: Soap and Water 1 x Per Day/30 Days Discharge Instructions: Gently cleanse wound with antibacterial soap, rinse and pat dry prior to dressing wounds Prim Dressing: SILVERCEL Antimicrobial Alginate Dressing, 1x12 (in/in) 1 x Per Day/30 Days ary Discharge Instructions: cut circle to fit in wound bed, do not over stuff, then cut another square to place over circle piece. Secondary Dressing: (BORDER) Zetuvit Plus SILICONE BORDER Dressing 4x4 (in/in) (Generic) 1 x Per Day/30 Days Discharge Instructions: Please do not put silicone bordered dressings under wraps. Use non-bordered dressing only. 1. I am going to recommend that we have the patient continue to monitor for any signs of infection or worsening. Based on what I am seeing I do believe that we are on the right track here and this is great news as far as the wound on the heel is concerned we were able to clean a lot of this out. With that being said the [facility] the home health nurse feels like is keeping this kind of wet. For that reason we will get try switching over to silver cell to see how this does over the next week they are in agreement with this plan. 2. Also can recommend we continue with the bordered foam dressing to cover and then the compression stocking to help with edema control. We will see patient back for reevaluation in 1 week here in the clinic. If anything worsens or changes patient will contact our office for additional recommendations. Electronic Signature(s) Signed: 01/30/2023 1:48:28 PM By: [name] Entered By: Worthy Keeler on 01/30/2023 13:48:27 Zook, [name] ([id number] Page 9 of 9 -------------------------------------------------------------------------------- SuperBill Details Patient Name: Date of Service: [name], [name] 01/30/2023 Medical Record Number: [id number] Patient Account Number: [id number] Date of Birth/Sex:  Treating RN: 10-01-1946 (77 y.o. [name] Primary Care Provider: [name] Other Clinician: Referring Provider: Treating Provider/Extender: [name] in Treatment: 9 Diagnosis Coding ICD-10 Codes Code Description E10.621 Type 1 diabetes mellitus with foot ulcer I73.89 Other specified peripheral vascular diseases L89.623 Pressure ulcer of left heel, stage 3 L89.613 Pressure ulcer of right heel, stage 3 L98.412 Non-pressure chronic ulcer of buttock with fat layer exposed I25.10 Atherosclerotic heart disease of native coronary artery without angina pectoris I50.42 Chronic combined systolic (congestive) and diastolic (congestive) heart failure I10 Essential (primary) hypertension Facility Procedures : CPT4 Code: [id number] Description: [id number] - DEB SUBQ TISSUE 20 SQ CM/< ICD-10 Diagnosis Description L89.623 Pressure ulcer of left heel, stage 3 Modifier: Quantity: 1 Physician Procedures : CPT4 Code Description Modifier [id number] - WC PHYS SUBQ TISS 20 SQ CM ICD-10 Diagnosis Description L89.623 Pressure ulcer of left heel, stage 3 Quantity: 1 Electronic Signature(s) Signed: 01/30/2023 3:58:45 PM By: [name] Signed: 01/30/2023 5:06:45 PM By: [name] Previous Signature: 01/30/2023 1:48:57 PM Version By: [name] Entered By: [name] on 01/30/2023 13:55:54

## 2023-01-31 NOTE — Progress Notes (Signed)
MCKADEN, DELON ([id number]) [id number] Page 1 of 9 Visit Report for 01/30/2023 Arrival Information Details Patient Name: Date of Service: [name], [name] 01/30/2023 1:00 PM Medical Record Number: [id number] Patient Account Number: [id number] Date of Birth/Sex: Treating RN: [birth year] (77 y.o. [name] Primary Care Happy Begeman: [name] Other Clinician: Referring Maclaine Ahola: Treating Doneta Bayman/Extender: [name] in Treatment: 9 Visit Information History Since Last Visit Added or deleted any medications: Yes Patient Arrived: Cane Any new allergies or adverse reactions: No Arrival Time: 13:03 Had a fall or experienced change in No Accompanied By: self activities of daily living that may affect Transfer Assistance: None risk of falls: Patient Identification Verified: Yes Hospitalized since last visit: No Secondary Verification Process Completed: Yes Has Dressing in Place as Prescribed: Yes Patient Requires Transmission-Based Precautions: No Pain Present Now: No Patient Has Alerts: Yes Patient Alerts: Type I Diabetic eliquis ABI/TBI normal see scan Electronic Signature(s) Signed: 01/30/2023 3:58:45 PM By: Levora Dredge Entered By: Levora Dredge on 01/30/2023 13:04:44 -------------------------------------------------------------------------------- Clinic Level of Care Assessment Details Patient Name: Date of Service: [name], [name] 01/30/2023 1:00 PM Medical Record Number: [id number] Patient Account Number: [id number] Date of Birth/Sex: Treating RN: [birth year] (77 y.o. [name] Primary Care Kiearra Oyervides: [name] Other Clinician: Referring Spero Gunnels: Treating Michaelpaul Apo/Extender: [name] in Treatment: 9 Clinic Level of Care Assessment Items TOOL 1 Quantity Score []  - 0 Use when EandM and Procedure is performed on INITIAL visit ASSESSMENTS - Nursing  Assessment / Reassessment []  - 0 General Physical Exam (combine w/ comprehensive assessment (listed just below) when performed on new pt. evals) []  - 0 Comprehensive Assessment (HX, ROS, Risk Assessments, Wounds Hx, etc.) ASSESSMENTS - Wound and Skin Assessment / Reassessment []  - 0 Dermatologic / Skin Assessment (not related to wound area) Lorentz, Buzz H ([id number]) [id number] Page 2 of 9 ASSESSMENTS - Ostomy and/or Continence Assessment and Care []  - 0 Incontinence Assessment and Management []  - 0 Ostomy Care Assessment and Management (repouching, etc.) PROCESS - Coordination of Care []  - 0 Simple Patient / Family Education for ongoing care []  - 0 Complex (extensive) Patient / Family Education for ongoing care []  - 0 Staff obtains Programmer, systems, Records, T Results / Process Orders est []  - 0 Staff telephones HHA, Nursing Homes / Clarify orders / etc []  - 0 Routine Transfer to another Facility (non-emergent condition) []  - 0 Routine Hospital Admission (non-emergent condition) []  - 0 New Admissions / Biomedical engineer / Ordering NPWT Apligraf, etc. , []  - 0 Emergency Hospital Admission (emergent condition) PROCESS - Special Needs []  - 0 Pediatric / Minor Patient Management []  - 0 Isolation Patient Management []  - 0 Hearing / Language / Visual special needs []  - 0 Assessment of Community assistance (transportation, D/C planning, etc.) []  - 0 Additional assistance / Altered mentation []  - 0 Support Surface(s) Assessment (bed, cushion, seat, etc.) INTERVENTIONS - Miscellaneous []  - 0 External ear exam []  - 0 Patient Transfer (multiple staff / Civil Service fast streamer / Similar devices) []  - 0 Simple Staple / Suture removal (25 or less) []  - 0 Complex Staple / Suture removal (26 or more) []  - 0 Hypo/Hyperglycemic Management (do not check if billed separately) []  - 0 Ankle / Brachial Index (ABI) - do not check if billed separately Has the  patient been seen at the hospital within the last three years: Yes Total Score: 0 Level Of Care: ____ Electronic Signature(s) Signed: 01/30/2023 3:58:45 PM By: Araceli Bouche,  Caitlin Entered By: Levora Dredge on 01/30/2023 13:55:45 -------------------------------------------------------------------------------- Encounter Discharge Information Details Patient Name: Date of Service: [name], [name] 01/30/2023 1:00 PM Medical Record Number: [id number] Patient Account Number: [id number] Date of Birth/Sex: Treating RN: 1946-03-21 (77 y.o. [name] Primary Care Williamson Cavanah: [name] Other Clinician: Referring Brittnay Pigman: Treating Philomena Buttermore/Extender: [name] in Treatment: 9 Encounter Discharge Information Items Post Procedure SANAY, REZNIKOV ([id number]) [id number] Page 3 of 9 Discharge Condition: Stable Temperature (F): 97.5 Ambulatory Status: Cane Pulse (bpm): 99 Discharge Destination: Home Respiratory Rate (breaths/min): 18 Transportation: Private Auto Blood Pressure (mmHg): 114/69 Accompanied By: family Schedule Follow-up Appointment: Yes Clinical Summary of Care: Electronic Signature(s) Signed: 01/30/2023 3:58:45 PM By: Levora Dredge Entered By: Levora Dredge on 01/30/2023 13:57:03 -------------------------------------------------------------------------------- Lower Extremity Assessment Details Patient Name: Date of Service: [name], [name] 01/30/2023 1:00 PM Medical Record Number: [id number] Patient Account Number: [id number] Date of Birth/Sex: Treating RN: 08-Jul-1946 (77 y.o. [name] Primary Care Tristen Pennino: [name] Other Clinician: Referring Palestine Mosco: Treating Adya Wirz/Extender: [name] in Treatment: 9 Edema Assessment Assessed: [Left: No] [Right: No] Edema: [Left: Ye] [Right: s] Calf Left: Right: Point of Measurement: 34 cm From Medial  Instep 35.2 cm Ankle Left: Right: Point of Measurement: 10 cm From Medial Instep 25.4 cm Vascular Assessment Pulses: Dorsalis Pedis Palpable: [Left:Yes] Posterior Tibial Palpable: [Left:Yes] Electronic Signature(s) Signed: 01/30/2023 3:58:45 PM By: Levora Dredge Entered By: Levora Dredge on 01/30/2023 13:19:14 Moeser, [name] ([id number] Page 4 of 9 -------------------------------------------------------------------------------- Multi Wound Chart Details Patient Name: Date of Service: [name], [name] 01/30/2023 1:00 PM Medical Record Number: [id number] Patient Account Number: [id number] Date of Birth/Sex: Treating RN: 1946-09-18 (76 y.o. Jerilynn Mages) Carlene Coria Primary Care Jareli Highland: [name] Other Clinician: Referring Rhyker Silversmith: Treating Rani Sisney/Extender: [name] in Treatment: 9 Vital Signs Height(in): 56 Pulse(bpm): 99 Weight(lbs): 190 Blood Pressure(mmHg): 114/69 Body Mass Index(BMI): 28.1 Temperature(F): 97.5 Respiratory Rate(breaths/min): 18 [5:Photos:] [N/A:N/A] Left Calcaneus N/A N/A Wound Location: Pressure Injury N/A N/A Wounding Event: Pressure Ulcer N/A N/A Primary Etiology: Coronary Artery Disease, N/A N/A Comorbid History: Hypertension, Type I Diabetes, Osteoarthritis, Received Chemotherapy 10/20/2022 N/A N/A Date Acquired: 9 N/A N/A Weeks of Treatment: Open N/A N/A Wound Status: No N/A N/A Wound Recurrence: 1.8x1.2x0.4 N/A N/A Measurements L x W x D (cm) 1.696 N/A N/A A (cm) : rea 0.679 N/A N/A Volume (cm) : -260.10% N/A N/A % Reduction in A rea: -1344.70% N/A N/A % Reduction in Volume: Category/Stage III N/A N/A Classification: Medium N/A N/A Exudate A mount: Serosanguineous N/A N/A Exudate Type: red, brown N/A N/A Exudate Color: None Present (0%) N/A N/A Granulation A mount: Large (67-100%) N/A N/A Necrotic A mount: Fat Layer (Subcutaneous  Tissue): Yes N/A N/A Exposed Structures: Fascia: No Tendon: No Muscle: No Joint: No Bone: No Small (1-33%) N/A N/A Epithelialization: Debridement - Excisional N/A N/A Debridement: Pre-procedure Verification/Time Out 13:40 N/A N/A Taken: Lidocaine 4% Topical Solution N/A N/A Pain Control: Subcutaneous, Slough N/A N/A Tissue Debrided: Skin/Subcutaneous Tissue N/A N/A Level: 2.16 N/A N/A Debridement A (sq cm): rea Curette N/A N/A Instrument: Minimum N/A N/A Bleeding: Pressure N/A N/A Hemostasis A chieved: Procedure was tolerated well N/A N/A Debridement Treatment Response: 1.8x1.2x0.4 N/A N/A Post Debridement Measurements L x TYMOTHY, COTTON ([id number]) [id number] Page 5 of 9 W x D (cm) 0.679 N/A N/A Post Debridement Volume: (cm) Category/Stage III N/A N/A Post Debridement Stage: Debridement N/A N/A Procedures Performed: Treatment Notes  Wound #5 (Calcaneus) Wound Laterality: Left Cleanser Soap and Water Discharge Instruction: Gently cleanse wound with antibacterial soap, rinse and pat dry prior to dressing wounds Peri-Wound Care Topical Primary Dressing SILVERCEL Antimicrobial Alginate Dressing, 1x12 (in/in) Discharge Instruction: cut circle to fit in wound bed, do not over stuff, then cut another square to place over circle piece. Secondary Dressing (BORDER) Zetuvit Plus SILICONE BORDER Dressing 4x4 (in/in) Discharge Instruction: Please do not put silicone bordered dressings under wraps. Use non-bordered dressing only. Secured With Compression Wrap Compression Stockings Environmental education officer) Signed: 01/30/2023 2:13:19 PM By: Carlene Coria RN Entered By: Carlene Coria on 01/30/2023 14:13:19 -------------------------------------------------------------------------------- Multi-Disciplinary Care Plan Details Patient Name: Date of Service: [name], [name] 01/30/2023 1:00 PM Medical Record Number:  [id number] Patient Account Number: [id number] Date of Birth/Sex: Treating RN: 21-Sep-1946 (77 y.o. [name] Primary Care Lenna Hagarty: [name] Other Clinician: Referring Minyon Billiter: Treating Ranay Ketter/Extender: [name] in Treatment: 9 Active Inactive Pressure Nursing Diagnoses: Potential for impaired tissue integrity related to pressure, friction, moisture, and shear Goals: Patient will remain free of pressure ulcers Date Initiated: 11/26/2022 Target Resolution Date: 12/24/2022 Goal Status: Active Interventions: Assess: immobility, friction, shearing, incontinence upon admission and as needed TEMUJIN, CAPTAIN ([id number]) [phone].pdf Page 6 of 9 Assess offloading mechanisms upon admission and as needed Assess potential for pressure ulcer upon admission and as needed Notes: Wound/Skin Impairment Nursing Diagnoses: Knowledge deficit related to ulceration/compromised skin integrity Goals: Patient/caregiver will verbalize understanding of skin care regimen Date Initiated: 11/26/2022 Date Inactivated: 01/16/2023 Target Resolution Date: 12/03/2022 Goal Status: Met Ulcer/skin breakdown will have a volume reduction of 30% by week 4 Date Initiated: 11/26/2022 Target Resolution Date: 12/24/2022 Goal Status: Active Ulcer/skin breakdown will have a volume reduction of 50% by week 8 Date Initiated: 11/26/2022 Target Resolution Date: 01/21/2023 Goal Status: Active Ulcer/skin breakdown will have a volume reduction of 80% by week 12 Date Initiated: 11/26/2022 Target Resolution Date: 02/18/2023 Goal Status: Active Ulcer/skin breakdown will heal within 14 weeks Date Initiated: 11/26/2022 Target Resolution Date: 03/18/2023 Goal Status: Active Interventions: Assess patient/caregiver ability to obtain necessary supplies Assess patient/caregiver ability to perform ulcer/skin care regimen upon admission and as needed Assess ulceration(s) every  visit Notes: Electronic Signature(s) Signed: 01/30/2023 3:58:45 PM By: Levora Dredge Entered By: Levora Dredge on 01/30/2023 13:56:08 -------------------------------------------------------------------------------- Pain Assessment Details Patient Name: Date of Service: [name], [name] 01/30/2023 1:00 PM Medical Record Number: [id number] Patient Account Number: [id number] Date of Birth/Sex: Treating RN: Jan 22, 1946 (77 y.o. [name] Primary Care Wang Granada: [name] Other Clinician: Referring Ronak Duquette: Treating Bear Osten/Extender: [name] in Treatment: 9 Active Problems Location of Pain Severity and Description of Pain Patient Has Paino No Site Locations Rate the pain. HAIG, SELA ([id number]) [id number] Page 7 of 9 Rate the pain. Current Pain Level: 0 Pain Management and Medication Current Pain Management: Electronic Signature(s) Signed: 01/30/2023 3:58:45 PM By: Levora Dredge Entered By: Levora Dredge on 01/30/2023 13:08:49 -------------------------------------------------------------------------------- Patient/Caregiver Education Details Patient Name: Date of Service: [name], CLA Fredda Hammed 3/21/2024andnbsp1:00 PM Medical Record Number: [id number] Patient Account Number: [id number] Date of Birth/Gender: Treating RN: 05-10-1946 (77 y.o. [name] Primary Care Physician: [name] Other Clinician: Referring Physician: Treating Physician/Extender: [name] in Treatment: 9 Education Assessment Education Provided To: Patient and Caregiver Education Topics Provided Wound Debridement: Handouts: Wound Debridement Methods: Explain/Verbal Responses: State content correctly Wound/Skin Impairment: Handouts: Caring for Your Ulcer Methods: Explain/Verbal Responses: State content correctly  Electronic Signature(s) Signed: 01/30/2023 3:58:45 PM By: Levora Dredge Entered By: Levora Dredge on 01/30/2023 13:56:21 Hunter, [name] ([id number] Page 8 of 9 -------------------------------------------------------------------------------- Wound Assessment Details Patient Name: Date of Service: [name], [name] 01/30/2023 1:00 PM Medical Record Number: [id number] Patient Account Number: [id number] Date of Birth/Sex: Treating RN: 03-08-1946 (77 y.o. [name] Primary Care Alpha Mysliwiec: [name] Other Clinician: Referring Mannix Kroeker: Treating Kayen Grabel/Extender: [name] in Treatment: 9 Wound Status Wound Number: 5 Primary Pressure Ulcer Etiology: Wound Location: Left Calcaneus Wound Open Wounding Event: Pressure Injury Status: Date Acquired: 10/20/2022 Comorbid Coronary Artery Disease, Hypertension, Type I Diabetes, Weeks Of Treatment: 9 History: Osteoarthritis, Received Chemotherapy Clustered Wound: No Photos Wound Measurements Length: (cm) 1.8 Width: (cm) 1.2 Depth: (cm) 0.4 Area: (cm) 1.696 Volume: (cm) 0.679 % Reduction in Area: 64% % Reduction in Volume: -44.2% Epithelialization: Small (1-33%) Tunneling: No Undermining: No Wound Description Classification: Category/Stage III Exudate Amount: Medium Exudate Type: Serosanguineous Exudate Color: red, brown Foul Odor After Cleansing: No Slough/Fibrino Yes Wound Bed Granulation Amount: None Present (0%) Exposed Structure Necrotic Amount: Large (67-100%) Fascia Exposed: No Necrotic Quality: Adherent Slough Fat Layer (Subcutaneous Tissue) Exposed: Yes Tendon Exposed: No Muscle Exposed: No Joint Exposed: No Bone Exposed: No Treatment Notes Wound #5 (Calcaneus) Wound Laterality: Left Cleanser Soap and Water Discharge Instruction: Gently cleanse wound with antibacterial soap, rinse and pat dry prior to dressing wounds Peri-Wound Care EVYN, TORBECK ([id number] Page 9 of 9 Topical Primary Dressing SILVERCEL Antimicrobial Alginate Dressing, 1x12 (in/in) Discharge Instruction: cut circle to fit in wound bed, do not over stuff, then cut another square to place over circle piece. Secondary Dressing (BORDER) Zetuvit Plus SILICONE BORDER Dressing 4x4 (in/in) Discharge Instruction: Please do not put silicone bordered dressings under wraps. Use non-bordered dressing only. Secured With Compression Wrap Compression Stockings Environmental education officer) Signed: 01/30/2023 2:14:34 PM By: Levora Dredge Entered By: Levora Dredge on 01/30/2023 14:14:34 -------------------------------------------------------------------------------- Vitals Details Patient Name: Date of Service: [name], [name] 01/30/2023 1:00 PM Medical Record Number: [id number] Patient Account Number: [id number] Date of Birth/Sex: Treating RN: 04-13-1946 (76 y.o. [name] Primary Care Paton Crum: [name] Other Clinician: Referring Rolene Andrades: Treating Myrical Andujo/Extender: [name] in Treatment: 9 Vital Signs Time Taken: 13:08 Temperature (F): 97.5 Height (in): 69 Pulse (bpm): 99 Weight (lbs): 190 Respiratory Rate (breaths/min): 18 Body Mass Index (BMI): 28.1 Blood Pressure (mmHg): 114/69 Reference Range: 80 - 120 mg / dl Electronic Signature(s) Signed: 01/30/2023 3:58:45 PM By: Levora Dredge Entered By: Levora Dredge on 01/30/2023 13:08:43

## 2023-02-03 DIAGNOSIS — Z9641 Presence of insulin pump (external) (internal): Secondary | ICD-10-CM | POA: Diagnosis not present

## 2023-02-03 DIAGNOSIS — E1042 Type 1 diabetes mellitus with diabetic polyneuropathy: Secondary | ICD-10-CM | POA: Diagnosis not present

## 2023-02-03 DIAGNOSIS — E1059 Type 1 diabetes mellitus with other circulatory complications: Secondary | ICD-10-CM | POA: Diagnosis not present

## 2023-02-03 DIAGNOSIS — E1029 Type 1 diabetes mellitus with other diabetic kidney complication: Secondary | ICD-10-CM | POA: Diagnosis not present

## 2023-02-03 DIAGNOSIS — R809 Proteinuria, unspecified: Secondary | ICD-10-CM | POA: Diagnosis not present

## 2023-02-03 DIAGNOSIS — E103212 Type 1 diabetes mellitus with mild nonproliferative diabetic retinopathy with macular edema, left eye: Secondary | ICD-10-CM | POA: Diagnosis not present

## 2023-02-03 DIAGNOSIS — L97421 Non-pressure chronic ulcer of left heel and midfoot limited to breakdown of skin: Secondary | ICD-10-CM | POA: Diagnosis not present

## 2023-02-04 ENCOUNTER — Other Ambulatory Visit: Payer: Self-pay

## 2023-02-04 DIAGNOSIS — Z9641 Presence of insulin pump (external) (internal): Secondary | ICD-10-CM | POA: Diagnosis not present

## 2023-02-04 DIAGNOSIS — E1042 Type 1 diabetes mellitus with diabetic polyneuropathy: Secondary | ICD-10-CM | POA: Diagnosis not present

## 2023-02-04 DIAGNOSIS — E103212 Type 1 diabetes mellitus with mild nonproliferative diabetic retinopathy with macular edema, left eye: Secondary | ICD-10-CM | POA: Diagnosis not present

## 2023-02-04 DIAGNOSIS — E1059 Type 1 diabetes mellitus with other circulatory complications: Secondary | ICD-10-CM | POA: Diagnosis not present

## 2023-02-04 DIAGNOSIS — R809 Proteinuria, unspecified: Secondary | ICD-10-CM | POA: Diagnosis not present

## 2023-02-04 DIAGNOSIS — E1029 Type 1 diabetes mellitus with other diabetic kidney complication: Secondary | ICD-10-CM | POA: Diagnosis not present

## 2023-02-04 MED ORDER — DOXYCYCLINE MONOHYDRATE 100 MG PO TABS: 100.0000 mg | ORAL_TABLET | Freq: Two times a day (BID) | ORAL | 2 refills | Status: DC

## 2023-02-06 ENCOUNTER — Encounter: Payer: Medicare HMO | Admitting: Physician Assistant

## 2023-02-06 DIAGNOSIS — L98412 Non-pressure chronic ulcer of buttock with fat layer exposed: Secondary | ICD-10-CM | POA: Diagnosis not present

## 2023-02-06 DIAGNOSIS — L89613 Pressure ulcer of right heel, stage 3: Secondary | ICD-10-CM | POA: Diagnosis not present

## 2023-02-06 DIAGNOSIS — L97422 Non-pressure chronic ulcer of left heel and midfoot with fat layer exposed: Secondary | ICD-10-CM | POA: Diagnosis not present

## 2023-02-06 DIAGNOSIS — M720 Palmar fascial fibromatosis [Dupuytren]: Secondary | ICD-10-CM | POA: Diagnosis not present

## 2023-02-06 DIAGNOSIS — I110 Hypertensive heart disease with heart failure: Secondary | ICD-10-CM | POA: Diagnosis not present

## 2023-02-06 DIAGNOSIS — E1051 Type 1 diabetes mellitus with diabetic peripheral angiopathy without gangrene: Secondary | ICD-10-CM | POA: Diagnosis not present

## 2023-02-06 DIAGNOSIS — E10621 Type 1 diabetes mellitus with foot ulcer: Secondary | ICD-10-CM | POA: Diagnosis not present

## 2023-02-06 DIAGNOSIS — I5042 Chronic combined systolic (congestive) and diastolic (congestive) heart failure: Secondary | ICD-10-CM | POA: Diagnosis not present

## 2023-02-06 DIAGNOSIS — L89623 Pressure ulcer of left heel, stage 3: Secondary | ICD-10-CM | POA: Diagnosis not present

## 2023-02-06 NOTE — Progress Notes (Addendum)
WILLIA, SEMAN ([id number]) [id number] Page 1 of 8 Visit Report for 02/06/2023 Chief Complaint Document Details Patient Name: Date of Service: [name] RTERFIELD, [name] 02/06/2023 1:00 PM Medical Record Number: [id number] Patient Account Number: [id number] Date of Birth/Sex: Treating RN: 04/02/1946 (77 y.o. [name] Primary Care Provider: [name] Other Clinician: Referring Provider: Treating Provider/Extender: [name] in Treatment: 10 Information Obtained from: Patient Chief Complaint Bilateral heel pressure ulcers and sacral pressure ulcer Electronic Signature(s) Signed: 02/06/2023 1:01:47 PM By: [name] Entered By: Worthy Keeler on 02/06/2023 13:01:46 -------------------------------------------------------------------------------- Debridement Details Patient Name: Date of Service: [name] RTERFIELD, [name] 02/06/2023 1:00 PM Medical Record Number: [id number] Patient Account Number: [id number] Date of Birth/Sex: Treating RN: 1946/07/03 (77 y.o. [name] Primary Care Provider: [name] Other Clinician: Referring Provider: Treating Provider/Extender: [name] in Treatment: 10 Debridement Performed for Assessment: Wound #5 Left Calcaneus Performed By: Physician [name], [name] Debridement Type: Debridement Level of Consciousness (Pre-procedure): Awake and Alert Pre-procedure Verification/Time Out Yes - 13:24 Taken: Pain Control: Lidocaine 4% T opical Solution T Area Debrided (L x W): otal 2.3 (cm) x 1.3 (cm) = 2.99 (cm) Tissue and other material debrided: Viable, Non-Viable, Callus, Slough, Subcutaneous, Slough Level: Skin/Subcutaneous Tissue Debridement Description: Excisional Instrument: Curette Bleeding: Moderate Hemostasis Achieved: Pressure Response to Treatment: Procedure was tolerated well Level of Consciousness (Post- Awake and  Alert procedure): Post Debridement Measurements of Total Wound Length: (cm) 2.3 Stage: Category/Stage III Width: (cm) 1.3 Depth: (cm) 0.9 Volume: (cm) 2.114 Character of Wound/Ulcer Post Debridement: Requires Further Debridement Post Procedure Diagnosis Same as Pre-procedure Electronic Signature(s) Signed: 02/06/2023 4:02:24 PM By: [name] Signed: 02/06/2023 4:31:55 PM By: Levora Dredge Entered By: Levora Dredge on 02/06/2023 13:32:35 HPI Details -------------------------------------------------------------------------------- [name] ([id number]) [id number] Page 2 of 8 Patient Name: Date of Service: [name] RTERFIELD, [name] 02/06/2023 1:00 PM Medical Record Number: [id number] Patient Account Number: [id number] Date of Birth/Sex: Treating RN: [birth year] (77 y.o. [name] Primary Care Provider: [name] Other Clinician: Referring Provider: Treating Provider/Extender: [name] in Treatment: 10 History of Present Illness Location: right anterior shin Quality: Patient reports No Pain. Severity: Patient states wound are getting worse. Duration: Patient has had the wound for > 6 months prior to seeking treatment at the wound center Context: The wound appeared gradually over time Modifying Factors: Other treatment(s) tried include: taken doxycycline and use bacitracin ssociated Signs and Symptoms: Patient reports presence of swelling A HPI Description: 77 year old male recently seen by his PCP Dr. Tawnya [name] who saw him for a wound on the right leg which is less tender and he is continuing to use bacitracin and has completed her course of doxycycline. Past medical history of coronary artery disease, diabetes mellitus type 1, hyperlipidemia, hypertension, MI, plantar fascial fibromatosis, pneumonia, status post foot surgery due to trauma on the left side, coronary angioplasty. he is a  former smoker and quit in July 1996. last hemoglobin A1c was 7.8 % in April of this year 5/22 2017 -- biopsy of the right lower extremity was sent for pathology and the report is that of a basal cell carcinoma with edges of the biopsy are involved. I have called the patient over the phone( 03/28/16) and discussed the pathology report with him and he is agreeable to see a surgeon for excision and need full. I have also spoken personally to Dr. Dahlia [name], of [name] and referred the patient for further wide excision of this lesion and have communicated this to the patient. READMISSION 08/12/18 This is a now 77 year old man with type 1 diabetes. Hemoglobin A1c on 4/29 was 7.9. He is listed as having a history of PAD in the [place] records although the patient doesn't recall this, doesn't complain of any symptoms of claudication and doesn't believe he has had any prior noninvasive arterial test. He is a nonsmoker. He was here in 2017 with a wound on his Right lower leg. This was biopsied in the clinic and pathology showed a basal cell carcinoma. He went on to have surgery on this area this is currently where I believe his current wounds are located. He generally bumped the lateral part of his right calf 2 weeks ago and has a superficial abrasion. He also has 2 small open areas on the medial part of the tibia in the same area. Our intake nurse noted a stitch coming out of this which was removed. These of the wound sees actually come to the clinic for. However the more concerning area is an area on the tip of the left great toe. He says that this was initially traumatic and he's been to see Dr. Sammuel [name] podiatrist. [name] been using what I think is Santyl to the wound tip. This has some depth. ABIs in this clinic were non-obtainable on the right and 1.84 on the left. 08/19/18; the patient had arterial studies that did not show evidence of significant hemodynamically compromising occlusions in either  leg. There was mild medial atherosclerosis bilaterally. His resting ABIs were within the normal limits at 1.3 on the right and 1.4 on the left. There was normal posterior and anterior tibial artery waveforms and velocities listed on both sides. The patient arrives with what appears to be a healthy surface over the tip of his left great toe. This is indeed surprising. Still looks somewhat friable however. More concerning is the x-ray that we did that showed erosions of the tuft of the distal phalanx the left great toe consistent with osteomyelitis. I attempted to pull this x-ray up on colon health Link however I can't open the film to look at this myself. 08/26/18; I reviewed the patient's x-ray and colon helpline. Indeed there is fairly obvious erosion of the tip of his left great toe. The wound was initially a traumatic area hitting it sometime in the middle of night in his kitchen. He is a type I diabetic. I have him on Flagyl and Levaquin that I started last week i.e. 7 days ago. He has an appointment with infectious disease on 09/01/18 09/02/18; the patient was seen by Dr. Delaine Lame of infectious disease. She did not feel the patient needed IV antibiotics. I do feel he needs further oral antibiotics however. He does not need anymore Flagyl but he does need another 2 weeks of Levaquin. The areas just about closed. 09/16/18; the patient is completing his Levaquin today. There is no open wound on the tip of the toe Readmission: 11-26-2022 upon evaluation today patient presents for initial inspection here in our clinic concerning issues that he has been having after having been hospitalized due to septic arthritis. Subsequently he has a PICC line that is been removed and he has been discharged from infectious disease in that regard. Unfortunately he developed shearing wounds over the gluteal region as well as a pressure ulcer over each heel although they are not looking extremely bad nonetheless they  do seem  to be evidence of pressure which she sustained while hospitalized. Obviously he was very sick and is still recovering as far as that is concerned. Fortunately there does not appear to be any evidence of active infection systemically which is good news at this point according to Dr. Ardyth Gal. Patient's wounds also do not appear to be too bad at this point which is good news. Patient does have a history of type 1 diabetes mellitus, peripheral vascular disease, coronary artery disease, congestive heart failure, hypertension, and he is on long-term anticoagulant therapy. 12-03-2022 upon evaluation today patient appears to be doing better to some degree in regard to his ulcers on his heels at this point. We are still waiting on the actual appointment with the vascular surgeons but nonetheless in the meantime I do believe that he is making some good progress currently with regard to loosen up the eschar. I did actually feel like that there was some ability for [place] to remove some of the necrotic tissue today and I discussed that with the patient. He is in agreement with that plan. We also can probably see about making an adjustment in the treatment regimen. 12-10-2022 upon evaluation today patient's wounds were really doing about the same. Again he is still awaiting the evaluation with the vascular surgeon. That should have already been done but had to be pushed back unfortunately. He will be seeing them next week on the seventh. I plan to see him following somewhere around the end of the week I think will probably be best. The patient voiced understanding. 12-19-2022 upon evaluation today patient appears to be doing well currently in regard to his wound we did get the results of his arterial studies which show that he has excellent ABIs with no signs of vascular compromise. For that reason I will go ahead and perform some debridement today to clearway the necrotic debris with his Eliquis that I will be  too aggressive so this will still be a stepwise process but we are to go ahead and get that started. 12-27-2022 upon evaluation today patient appears to be doing well currently in regard to his wounds. They are actually showing signs of excellent improvement. There is some necrotic tissue noted on the surface of wounds to work on clearing some of that away today also I think that we may switch to [facility] dressing to see how things do going forward. He is in agreement with that plan. Subsequently I am extremely pleased with where we stand currently. No fevers, chills, nausea, vomiting, or diarrhea. 01-02-2023 upon evaluation today patient appears to be doing much better in regard to his wounds the right foot appears to be almost healed the left foot though not completely healed is significantly better. Fortunately there does not appear to be any signs of active infection at this point. NIKOLAI, [name] ([id number]) [id number] Page 3 of 8 01-10-2023 upon evaluation today patient appears to be doing well currently in regard to his wounds. The right heel actually is completely healed the left heel is definitely headed in the right direction and looks to be doing awesome. I do not see any signs of active infection locally nor systemically at this time which is great news. 3/7; his right heel has remained healed. There is no evidence of active infection. The remaining wound is on the lateral aspect of the heel just above the plantar surface 01-30-2023 upon evaluation today patient appears to be doing well currently in regard to his wound. The  heel actually showing signs of improvement which is great news and fortunately I do not see any evidence of active infection locally nor systemically at this time. Overall I think that we are headed in the right direction which is excellent here. No fevers, chills, nausea, vomiting, or diarrhea. I am good have to perform some sharp  debridement today. This is just the left heel that is remaining open. 02-06-2023 upon evaluation today patient appears to be doing well currently in regard to his wound. Has been tolerating the dressing changes without complication. Fortunately there does not appear to be any signs of active infection locally nor systemically although he does have some need for sharp debridement with some definite necrotic tissue at the base of the wound today. Electronic Signature(s) Signed: 02/06/2023 1:38:56 PM By: [name] Entered By: Worthy Keeler on 02/06/2023 13:38:56 -------------------------------------------------------------------------------- Physical Exam Details Patient Name: Date of Service: [name] RTERFIELD, [name] 02/06/2023 1:00 PM Medical Record Number: [id number] Patient Account Number: [id number] Date of Birth/Sex: Treating RN: 1946/06/20 (77 y.o. [name] Primary Care Provider: [name] Other Clinician: Referring Provider: Treating Provider/Extender: [name] in Treatment: 48 Constitutional Well-nourished and well-hydrated in no acute distress. Respiratory normal breathing without difficulty. Psychiatric this patient is able to make decisions and demonstrates good insight into disease process. Alert and Oriented x 3. pleasant and cooperative. Notes Patient's wound bed did require sharp debridement to clearway the necrotic debris he did not have any new bruising but he did have more necrotic tissue and I was able to remove a lot of this wound was definitely deeper I think that we may need to try to do something from a shoe perspective to keep pressure off and I think that he would benefit from a open heel orthosis shoe. I discussed that with both of them in the office today they are good to look into getting that. Electronic Signature(s) Signed: 02/06/2023 1:39:32 PM By: [name] Entered By: Worthy Keeler on 02/06/2023  13:39:31 -------------------------------------------------------------------------------- Physician Orders Details Patient Name: Date of Service: [name] RTERFIELD, [name] 02/06/2023 1:00 PM Medical Record Number: [id number] Patient Account Number: [id number] Date of Birth/Sex: Treating RN: [birth year] (77 y.o. [name] Primary Care Provider: [name] Other Clinician: Referring Provider: Treating Provider/Extender: [name] in Treatment: 10 Verbal / Phone Orders: No Diagnosis Coding ICD-10 Coding Code Description E10.621 Type 1 diabetes mellitus with foot ulcer I73.89 Other specified peripheral vascular diseases L89.623 Pressure ulcer of left heel, stage 3 L89.613 Pressure ulcer of right heel, stage 3 L98.412 Non-pressure chronic ulcer of buttock with fat layer exposed I25.10 Atherosclerotic heart disease of native coronary artery without angina pectoris I50.42 Chronic combined systolic (congestive) and diastolic (congestive) heart failure Lucken, Azzan H ([id number]) [id number] Page 4 of 8 I10 Essential (primary) hypertension Follow-up Appointments Return Appointment in 1 week. Nurse Visit as needed [facility] for wound care. May utilize formulary equivalent dressing for wound treatment orders unless otherwise specified. Home Health Nurse may visit PRN to address patients wound care needs. Alvis Lemmings fax [phone] [facility] Nurse. PLEASE change dressing on the left heel when you visit unless partner has already changed dressing. Apply Hydrofera blue and cover with a bordered foam dressing. Reapply compression stockings Bathing/ Shower/ Hygiene May shower; gently cleanse wound with antibacterial soap, rinse and pat dry prior to dressing wounds Anesthetic (Use 'Patient Medications' Section for Anesthetic Order Entry) Lidocaine applied  to wound bed Edema Control - Lymphedema / Segmental  Compressive Device / Other Elevate, Exercise Daily and A void Standing for Long Periods of Time. Elevate legs to the level of the heart and pump ankles as often as possible Elevate leg(s) parallel to the floor when sitting. Wound Treatment Wound #5 - Calcaneus Wound Laterality: Left Cleanser: Soap and Water 1 x Per Day/30 Days Discharge Instructions: Gently cleanse wound with antibacterial soap, rinse and pat dry prior to dressing wounds Prim Dressing: Hydrofera Blue Ready Transfer Foam, 2.5x2.5 (in/in) (Generic) 1 x Per Day/30 Days ary Discharge Instructions: Apply Hydrofera Blue Ready to wound bed as directed Please cut a small cirle to fit in wound bed and then place a square piece over top Secondary Dressing: (BORDER) Zetuvit Plus SILICONE BORDER Dressing 4x4 (in/in) (Generic) 1 x Per Day/30 Days Discharge Instructions: Please do not put silicone bordered dressings under wraps. Use non-bordered dressing only. Electronic Signature(s) Signed: 02/06/2023 4:02:24 PM By: [name] Signed: 02/06/2023 4:31:55 PM By: Levora Dredge Entered By: Levora Dredge on 02/06/2023 13:46:50 -------------------------------------------------------------------------------- Problem List Details Patient Name: Date of Service: [name] RTERFIELD, [name] 02/06/2023 1:00 PM Medical Record Number: [id number] Patient Account Number: [id number] Date of Birth/Sex: Treating RN: Apr 02, 1946 (77 y.o. [name] Primary Care Provider: [name] Other Clinician: Referring Provider: Treating Provider/Extender: [name] in Treatment: 10 Active Problems ICD-10 Encounter Code Description Active Date MDM Diagnosis E10.621 Type 1 diabetes mellitus with foot ulcer 11/26/2022 No Yes I73.89 Other specified peripheral vascular diseases 11/26/2022 No Yes L89.623 Pressure ulcer of left heel, stage 3 11/26/2022 No Yes L89.613 Pressure ulcer of right heel, stage 3 11/26/2022 No  Yes L98.412 Non-pressure chronic ulcer of buttock with fat layer exposed 11/26/2022 No Yes Berkley, Isiac H ([id number]) [phone].pdf Page 5 of 8 I25.10 Atherosclerotic heart disease of native coronary artery without angina pectoris 11/26/2022 No Yes I50.42 Chronic combined systolic (congestive) and diastolic (congestive) heart failure 11/26/2022 No Yes I10 Essential (primary) hypertension 11/26/2022 No Yes Inactive Problems Resolved Problems Electronic Signature(s) Signed: 02/06/2023 1:01:44 PM By: [name] Entered By: Worthy Keeler on 02/06/2023 13:01:44 -------------------------------------------------------------------------------- Progress Note Details Patient Name: Date of Service: [name] RTERFIELD, [name] 02/06/2023 1:00 PM Medical Record Number: [id number] Patient Account Number: [id number] Date of Birth/Sex: Treating RN: 03-29-1946 (77 y.o. [name] Primary Care Provider: [name] Other Clinician: Referring Provider: Treating Provider/Extender: [name] in Treatment: 10 Subjective Chief Complaint Information obtained from Patient Bilateral heel pressure ulcers and sacral pressure ulcer History of Present Illness (HPI) The following HPI elements were documented for the patient's wound: Location: right anterior shin Quality: Patient reports No Pain. Severity: Patient states wound are getting worse. Duration: Patient has had the wound for > 6 months prior to seeking treatment at the wound center Context: The wound appeared gradually over time Modifying Factors: Other treatment(s) tried include: taken doxycycline and use bacitracin Associated Signs and Symptoms: Patient reports presence of swelling 77 year old male recently seen by his PCP Dr. Tawnya [name] who saw him for a wound on the right leg which is less tender and he is continuing to use bacitracin and has completed her course of  doxycycline. Past medical history of coronary artery disease, diabetes mellitus type 1, hyperlipidemia, hypertension, MI, plantar fascial fibromatosis, pneumonia, status post foot surgery due to trauma on the left side, coronary angioplasty. he is a former smoker and quit in July 1996. last hemoglobin A1c  was 7.8 % in April of this year 5/22 2017 -- biopsy of the right lower extremity was sent for pathology and the report is that of a basal cell carcinoma with edges of the biopsy are involved. I have called the patient over the phone( 03/28/16) and discussed the pathology report with him and he is agreeable to see a surgeon for excision and need full. I have also spoken personally to Dr. Dahlia [name], of [facility] surgical and referred the patient for further wide excision of this lesion and have communicated this to the patient. READMISSION 08/12/18 This is a now 77 year old man with type 1 diabetes. Hemoglobin A1c on 4/29 was 7.9. He is listed as having a history of PAD in the [place] records although the patient doesn't recall this, doesn't complain of any symptoms of claudication and doesn't believe he has had any prior noninvasive arterial test. He is a nonsmoker. He was here in 2017 with a wound on his Right lower leg. This was biopsied in the clinic and pathology showed a basal cell carcinoma. He went on to have surgery on this area this is currently where I believe his current wounds are located. He generally bumped the lateral part of his right calf 2 weeks ago and has a superficial abrasion. He also has 2 small open areas on the medial part of the tibia in the same area. Our intake nurse noted a stitch coming out of this which was removed. These of the wound sees actually come to the clinic for. However the more concerning area is an area on the tip of the left great toe. He says that this was initially traumatic and he's been to see Dr. Sammuel [name] podiatrist. [name] been using what I think is Santyl  to the wound tip. This has some depth. ABIs in this clinic were non-obtainable on the right and 1.84 on the left. 08/19/18; the patient had arterial studies that did not show evidence of significant hemodynamically compromising occlusions in either leg. There was mild medial atherosclerosis bilaterally. His resting ABIs were within the normal limits at 1.3 on the right and 1.4 on the left. There was normal posterior and anterior tibial artery waveforms and velocities listed on both sides. [name], [name] ([id number]) [id number] Page 6 of 8 The patient arrives with what appears to be a healthy surface over the tip of his left great toe. This is indeed surprising. Still looks somewhat friable however. More concerning is the x-ray that we did that showed erosions of the tuft of the distal phalanx the left great toe consistent with osteomyelitis. I attempted to pull this x-ray up on colon health Link however I can't open the film to look at this myself. 08/26/18; I reviewed the patient's x-ray and colon helpline. Indeed there is fairly obvious erosion of the tip of his left great toe. The wound was initially a traumatic area hitting it sometime in the middle of night in his kitchen. He is a type I diabetic. I have him on Flagyl and Levaquin that I started last week i.e. 7 days ago. He has an appointment with infectious disease on 09/01/18 09/02/18; the patient was seen by Dr. Delaine Lame of infectious disease. She did not feel the patient needed IV antibiotics. I do feel he needs further oral antibiotics however. He does not need anymore Flagyl but he does need another 2 weeks of Levaquin. The areas just about closed. 09/16/18; the patient is completing his Levaquin today. There is no open wound  on the tip of the toe Readmission: 11-26-2022 upon evaluation today patient presents for initial inspection here in our clinic concerning issues that he has been having after having  been hospitalized due to septic arthritis. Subsequently he has a PICC line that is been removed and he has been discharged from infectious disease in that regard. Unfortunately he developed shearing wounds over the gluteal region as well as a pressure ulcer over each heel although they are not looking extremely bad nonetheless they do seem to be evidence of pressure which she sustained while hospitalized. Obviously he was very sick and is still recovering as far as that is concerned. Fortunately there does not appear to be any evidence of active infection systemically which is good news at this point according to Dr. Ardyth Gal. Patient's wounds also do not appear to be too bad at this point which is good news. Patient does have a history of type 1 diabetes mellitus, peripheral vascular disease, coronary artery disease, congestive heart failure, hypertension, and he is on long-term anticoagulant therapy. 12-03-2022 upon evaluation today patient appears to be doing better to some degree in regard to his ulcers on his heels at this point. We are still waiting on the actual appointment with the vascular surgeons but nonetheless in the meantime I do believe that he is making some good progress currently with regard to loosen up the eschar. I did actually feel like that there was some ability for [place] to remove some of the necrotic tissue today and I discussed that with the patient. He is in agreement with that plan. We also can probably see about making an adjustment in the treatment regimen. 12-10-2022 upon evaluation today patient's wounds were really doing about the same. Again he is still awaiting the evaluation with the vascular surgeon. That should have already been done but had to be pushed back unfortunately. He will be seeing them next week on the seventh. I plan to see him following somewhere around the end of the week I think will probably be best. The patient voiced understanding. 12-19-2022 upon  evaluation today patient appears to be doing well currently in regard to his wound we did get the results of his arterial studies which show that he has excellent ABIs with no signs of vascular compromise. For that reason I will go ahead and perform some debridement today to clearway the necrotic debris with his Eliquis that I will be too aggressive so this will still be a stepwise process but we are to go ahead and get that started. 12-27-2022 upon evaluation today patient appears to be doing well currently in regard to his wounds. They are actually showing signs of excellent improvement. There is some necrotic tissue noted on the surface of wounds to work on clearing some of that away today also I think that we may switch to [facility] dressing to see how things do going forward. He is in agreement with that plan. Subsequently I am extremely pleased with where we stand currently. No fevers, chills, nausea, vomiting, or diarrhea. 01-02-2023 upon evaluation today patient appears to be doing much better in regard to his wounds the right foot appears to be almost healed the left foot though not completely healed is significantly better. Fortunately there does not appear to be any signs of active infection at this point. 01-10-2023 upon evaluation today patient appears to be doing well currently in regard to his wounds. The right heel actually is completely healed the left heel is definitely  headed in the right direction and looks to be doing awesome. I do not see any signs of active infection locally nor systemically at this time which is great news. 3/7; his right heel has remained healed. There is no evidence of active infection. The remaining wound is on the lateral aspect of the heel just above the plantar surface 01-30-2023 upon evaluation today patient appears to be doing well currently in regard to his wound. The heel actually showing signs of improvement which is great news and fortunately I do  not see any evidence of active infection locally nor systemically at this time. Overall I think that we are headed in the right direction which is excellent here. No fevers, chills, nausea, vomiting, or diarrhea. I am good have to perform some sharp debridement today. This is just the left heel that is remaining open. 02-06-2023 upon evaluation today patient appears to be doing well currently in regard to his wound. Has been tolerating the dressing changes without complication. Fortunately there does not appear to be any signs of active infection locally nor systemically although he does have some need for sharp debridement with some definite necrotic tissue at the base of the wound today. Objective Constitutional Well-nourished and well-hydrated in no acute distress. Vitals Time Taken: 1:03 PM, Height: 69 in, Weight: 190 lbs, BMI: 28.1, Temperature: 97.5 F, Pulse: 89 bpm, Respiratory Rate: 18 breaths/min, Blood Pressure: 100/57 mmHg. Respiratory normal breathing without difficulty. Psychiatric this patient is able to make decisions and demonstrates good insight into disease process. Alert and Oriented x 3. pleasant and cooperative. General Notes: Patient's wound bed did require sharp debridement to clearway the necrotic debris he did not have any new bruising but he did have more necrotic tissue and I was able to remove a lot of this wound was definitely deeper I think that we may need to try to do something from a shoe perspective to keep pressure off and I think that he would benefit from a open heel orthosis shoe. I discussed that with both of them in the office today they are good to look into getting that. Integumentary (Hair, Skin) Wound #5 status is Open. Original cause of wound was Pressure Injury. The date acquired was: 10/20/2022. The wound has been in treatment 10 weeks. The wound is located on the Left Calcaneus. The wound measures 2.3cm length x 1.3cm width x 0.5cm depth;  2.348cm^2 area and 1.174cm^3 volume. There is Fat HEARLD, [name] ([id number]) [id number] Page 7 of 8 Layer (Subcutaneous Tissue) exposed. There is no tunneling or undermining noted. There is a medium amount of serosanguineous drainage noted. There is no granulation within the wound bed. There is a large (67-100%) amount of necrotic tissue within the wound bed including Adherent Slough. 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 There was a Excisional Skin/Subcutaneous Tissue Debridement with a total area of 2.99 sq cm performed by [name], [name]. With the following instrument(s): Curette to remove Viable and Non-Viable tissue/material. Material removed includes Callus, Subcutaneous Tissue, and Slough after achieving pain control using Lidocaine 4% T  opical Solution. No specimens were taken. A time out was conducted at 13:24, prior to the start of the procedure. A Moderate amount of bleeding was controlled with Pressure. The procedure was tolerated well. Post Debridement Measurements: 2.3cm length x 1.3cm width x 0.9cm depth; 2.114cm^3 volume. Post debridement Stage noted as Category/Stage III. Character of Wound/Ulcer Post Debridement requires further debridement. Post procedure Diagnosis Wound #5: Same as Pre-Procedure Plan Follow-up Appointments: Return Appointment in 1 week. Nurse Visit as needed Home Health: [facility] for wound  care. May utilize formulary equivalent dressing for wound treatment orders unless otherwise specified. Home Health Nurse may visit PRN to address patientoos wound care needs. Alvis Lemmings fax [phone] [facility] Nurse. PLEASE change dressing on the left heel when you visit unless partner has already changed dressing. Apply Hydrofera blue and cover with a bordered foam dressing. Reapply compression stockings Bathing/ Shower/ Hygiene: May shower; gently cleanse wound with antibacterial soap, rinse and pat dry prior to dressing wounds Anesthetic (Use 'Patient Medications' Section for Anesthetic Order Entry): Lidocaine applied to wound bed Edema Control - Lymphedema / Segmental Compressive Device / Other: Elevate, Exercise Daily and Avoid Standing for Long Periods of Time. Elevate legs to the level of the heart and pump ankles as often as possible Elevate leg(s) parallel to the floor when sitting. WOUND #5: - Calcaneus Wound Laterality: Left Cleanser: Soap and Water 1 x Per Day/30 Days Discharge Instructions: Gently cleanse wound with antibacterial soap, rinse and pat dry prior to dressing wounds Prim Dressing: Hydrofera Blue Ready Transfer Foam, 2.5x2.5 (in/in) (Generic) 1 x Per Day/30 Days ary Discharge Instructions: Apply Hydrofera Blue Ready to wound bed as directed Please cut a small cirle to fit in wound bed and then place a square piece over top Secondary Dressing: (BORDER) Zetuvit Plus SILICONE BORDER Dressing 4x4 (in/in) (Generic) 1 x Per Day/30 Days Discharge Instructions: Please do not put silicone bordered dressings under wraps. Use non-bordered dressing only. 1. I am recommend currently that we have the patient continue to monitor for any signs of infection or worsening. Based on what I am seeing right now I do believe that he would benefit from getting him an open heel orthosis shoe and they can look into doing that. 2. I am also can recommend the patient should continue to elevate his legs  much as possible when he is sitting he did have a lot more swelling today and we will make sure to keep this under control. 3. I am also going to suggest that the patient should continue to monitor for any signs of infection or worsening I do not see anything right now but again that something that we need to keep a close eye on. We will see patient back for reevaluation in 1 week here in the clinic. If anything worsens or changes patient will contact our office for additional recommendations. Electronic Signature(s) Signed: 02/06/2023 1:51:40 PM By: [name] Previous Signature: 02/06/2023 1:40:17 PM Version By: [name] Entered By: Worthy Keeler on 02/06/2023 13:51:40 [name], [name] ([id number]) [id number] Page 8 of 8 -------------------------------------------------------------------------------- SuperBill Details Patient Name: Date of Service: [name] RTERFIELD, [name] 02/06/2023 Medical Record Number: [id number] Patient Account Number: [id number] Date of Birth/Sex: Treating RN: Oct 05, 1946 (77 y.o. [name] Primary Care Provider: [name] Other Clinician: Referring Provider: Treating Provider/Extender: [name] in Treatment: 10 Diagnosis Coding ICD-10 Codes Code Description [phone] Type 1 diabetes mellitus with foot ulcer I73.89 Other  specified peripheral vascular diseases L89.623 Pressure ulcer of left heel, stage 3 L89.613 Pressure ulcer of right heel, stage 3 L98.412 Non-pressure chronic ulcer of buttock with fat layer exposed I25.10 Atherosclerotic heart disease of native coronary artery without angina pectoris I50.42 Chronic combined systolic (congestive) and diastolic (congestive) heart failure I10 Essential (primary) hypertension Facility Procedures : CPT4 Code: [id number] Description: [place] - DEB SUBQ TISSUE 20 SQ CM/< ICD-10 Diagnosis Description L89.623 Pressure ulcer of left  heel, stage 3 Modifier: Quantity: 1 Physician Procedures : CPT4 Code Description Modifier [id number] - WC PHYS SUBQ TISS 20 SQ CM ICD-10 Diagnosis Description L89.623 Pressure ulcer of left heel, stage 3 Quantity: 1 Electronic Signature(s) Signed: 02/06/2023 4:02:24 PM By: [name] Signed: 02/06/2023 4:31:55 PM By: Levora Dredge Previous Signature: 02/06/2023 1:40:31 PM Version By: [name] Entered By: Levora Dredge on 02/06/2023 13:45:05

## 2023-02-06 NOTE — Progress Notes (Addendum)
AURI, MEHRER ([id number]) [id number] Page 1 of 7 Visit Report for 02/06/2023 Arrival Information Details Patient Name: Date of Service: [name], [name] 02/06/2023 1:00 PM Medical Record Number: [id number] Patient Account Number: [id number] Date of Birth/Sex: Treating RN: May 28, 1946 (77 y.o. [name] Primary Care Tiamarie Furnari: [name] Other Clinician: Referring Nathanael Krist: Treating Anadelia Kintz/Extender: [name] in Treatment: 10 Visit Information History Since Last Visit Added or deleted any medications: No Patient Arrived: Kasandra Knudsen Any new allergies or adverse reactions: No Arrival Time: 13:02 Had a fall or experienced change in No Accompanied By: family activities of daily living that may affect Transfer Assistance: None risk of falls: Patient Identification Verified: Yes Hospitalized since last visit: No Secondary Verification Process Completed: Yes Has Dressing in Place as Prescribed: Yes Patient Requires Transmission-Based Precautions: No Has Compression in Place as Prescribed: Yes Patient Has Alerts: Yes Pain Present Now: No Patient Alerts: Type I Diabetic eliquis ABI/TBI normal see scan Electronic Signature(s) Signed: 02/06/2023 4:31:55 PM By: Levora Dredge Entered By: Levora Dredge on 02/06/2023 13:03:24 -------------------------------------------------------------------------------- Clinic Level of Care Assessment Details Patient Name: Date of Service: [name], [name] 02/06/2023 1:00 PM Medical Record Number: [id number] Patient Account Number: [id number] Date of Birth/Sex: Treating RN: 1946-11-11 (77 y.o. [name] Primary Care Asriel Westrup: [name] Other Clinician: Referring Jissel Slavens: Treating Ayvin Lipinski/Extender: [name] in Treatment: 10 Clinic Level of Care Assessment Items TOOL 1 Quantity Score []  - 0 Use when EandM and Procedure is performed  on INITIAL visit ASSESSMENTS - Nursing Assessment / Reassessment []  - 0 General Physical Exam (combine w/ comprehensive assessment (listed just below) when performed on new pt. evals) []  - 0 Comprehensive Assessment (HX, ROS, Risk Assessments, Wounds Hx, etc.) ASSESSMENTS - Wound and Skin Assessment / Reassessment []  - 0 Dermatologic / Skin Assessment (not related to wound area) ASSESSMENTS - Ostomy and/or Continence Assessment and Care []  - 0 Incontinence Assessment and Management []  - 0 Ostomy Care Assessment and Management (repouching, etc.) PROCESS - Coordination of Care []  - 0 Simple Patient / Family Education for ongoing care []  - 0 Complex (extensive) Patient / Family Education for ongoing care []  - 0 Staff obtains Programmer, systems, Records, T Results / Process Orders est []  - 0 Staff telephones HHA, Nursing Homes / Clarify orders / etc []  - 0 Routine Transfer to another Facility (non-emergent condition) []  - 0 Routine Hospital Admission (non-emergent condition) []  - 0 New Admissions / Insurance Authorizations / Ordering NPWT Apligraf, etc. , []  - 0 Emergency Hospital Admission (emergent condition) Perret, Drew H ([id number]) [phone].pdf Page 2 of 7 PROCESS - Special Needs []  - 0 Pediatric / Minor Patient Management []  - 0 Isolation Patient Management []  - 0 Hearing / Language / Visual special needs []  - 0 Assessment of Community assistance (transportation, D/C planning, etc.) []  - 0 Additional assistance / Altered mentation []  - 0 Support Surface(s) Assessment (bed, cushion, seat, etc.) INTERVENTIONS - Miscellaneous []  - 0 External ear exam []  - 0 Patient Transfer (multiple staff / Civil Service fast streamer / Similar devices) []  - 0 Simple Staple / Suture removal (25 or less) []  - 0 Complex Staple / Suture removal (26 or more) []  - 0 Hypo/Hyperglycemic Management (do not check if billed separately) []  - 0 Ankle / Brachial Index (ABI) - do  not check if billed separately Has the patient been seen at the hospital within the last three years: Yes Total Score: 0 Level Of Care: ____ Electronic  Signature(s) Signed: 02/06/2023 4:31:55 PM By: Levora Dredge Entered By: Levora Dredge on 02/06/2023 13:44:57 -------------------------------------------------------------------------------- Encounter Discharge Information Details Patient Name: Date of Service: [name], [name] 02/06/2023 1:00 PM Medical Record Number: [id number] Patient Account Number: [id number] Date of Birth/Sex: Treating RN: [birth year] (77 y.o. [name] Primary Care Kyndal Heringer: [name] Other Clinician: Referring Estill Llerena: Treating Amahia Madonia/Extender: [name] in Treatment: 10 Encounter Discharge Information Items Post Procedure Vitals Discharge Condition: Stable Temperature (F): 97.5 Ambulatory Status: Cane Pulse (bpm): 89 Discharge Destination: Home Respiratory Rate (breaths/min): 18 Transportation: Private Auto Blood Pressure (mmHg): 100/57 Accompanied By: family Schedule Follow-up Appointment: Yes Clinical Summary of Care: Electronic Signature(s) Signed: 02/06/2023 4:31:55 PM By: Levora Dredge Entered By: Levora Dredge on 02/06/2023 13:46:20 -------------------------------------------------------------------------------- Lower Extremity Assessment Details Patient Name: Date of Service: [name], [name] 02/06/2023 1:00 PM Medical Record Number: [id number] Patient Account Number: [id number] Date of Birth/Sex: Treating RN: [birth year] (77 y.o. [name] Primary Care Montgomery Rothlisberger: [name] Other Clinician: Referring Keiarah Orlowski: Treating Madilyne Tadlock/Extender: [name] in Treatment: 10 Edema Assessment Assessed: [name]: No] Patrice Paradise: No] [Left: Edema] [Right: :] P[Left: ORTERFIELD, [name] [id number] [[id number] Page 3 of  7] Calf Left: Right: Point of Measurement: 34 cm From Medial Instep 39.9 cm Ankle Left: Right: Point of Measurement: 10 cm From Medial Instep 29 cm Vascular Assessment Pulses: Dorsalis Pedis Palpable: [Left:Yes] Electronic Signature(s) Signed: 02/06/2023 4:31:55 PM By: Levora Dredge Entered By: Levora Dredge on 02/06/2023 13:17:22 -------------------------------------------------------------------------------- Multi Wound Chart Details Patient Name: Date of Service: [name], [name] 02/06/2023 1:00 PM Medical Record Number: [id number] Patient Account Number: [id number] Date of Birth/Sex: Treating RN: [birth year] (77 y.o. [name] Primary Care Gibson Lad: [name] Other Clinician: Referring Courtny Bennison: Treating Drayke Grabel/Extender: [name] in Treatment: 10 Vital Signs Height(in): 100 Pulse(bpm): 80 Weight(lbs): 190 Blood Pressure(mmHg): 100/57 Body Mass Index(BMI): 28.1 Temperature(F): 97.5 Respiratory Rate(breaths/min): 18 [5:Photos:] [N/A:N/A] Left Calcaneus N/A N/A Wound Location: Pressure Injury N/A N/A Wounding Event: Pressure Ulcer N/A N/A Primary Etiology: Coronary Artery Disease, N/A N/A Comorbid History: Hypertension, Type I Diabetes, Osteoarthritis, Received Chemotherapy 10/20/2022 N/A N/A Date Acquired: 10 N/A N/A Weeks of Treatment: Open N/A N/A Wound Status: No N/A N/A Wound Recurrence: 2.3x1.3x0.5 N/A N/A Measurements L x W x D (cm) 2.348 N/A N/A A (cm) : rea 1.174 N/A N/A Volume (cm) : 50.20% N/A N/A % Reduction in A rea: -149.30% N/A N/A % Reduction in Volume: Category/Stage III N/A N/A Classification: Medium N/A N/A Exudate A mount: Serosanguineous N/A N/A Exudate Type: red, brown N/A N/A Exudate Color: None Present (0%) N/A N/A Granulation A mount: Large (67-100%) N/A N/A Necrotic A mount: Fat Layer (Subcutaneous Tissue): Yes N/A N/A Exposed Structures: Fascia: No Tendon:  No Muscle: No Joint: No Brideau, Duan H ([id number] Page 4 of 7 Bone: No Small (1-33%) N/A N/A Epithelialization: Treatment Notes Electronic Signature(s) Signed: 02/06/2023 4:31:55 PM By: Levora Dredge Entered By: Levora Dredge on 02/06/2023 13:23:16 -------------------------------------------------------------------------------- Multi-Disciplinary Care Plan Details Patient Name: Date of Service: [name], [name] 02/06/2023 1:00 PM Medical Record Number: [id number] Patient Account Number: [id number] Date of Birth/Sex: Treating RN: June 02, 1946 (77 y.o. [name] Primary Care Letty Salvi: [name] Other Clinician: Referring Larrell Rapozo: Treating Arla Boutwell/Extender: [name] in Treatment: 10 Active Inactive Pressure Nursing Diagnoses: Potential for impaired tissue integrity related to pressure, friction, moisture, and shear Goals: Patient will remain free of pressure ulcers Date Initiated:  11/26/2022 Target Resolution Date: 12/24/2022 Goal Status: Active Interventions: Assess: immobility, friction, shearing, incontinence upon admission and as needed Assess offloading mechanisms upon admission and as needed Assess potential for pressure ulcer upon admission and as needed Notes: Wound/Skin Impairment Nursing Diagnoses: Knowledge deficit related to ulceration/compromised skin integrity Goals: Patient/caregiver will verbalize understanding of skin care regimen Date Initiated: 11/26/2022 Date Inactivated: 01/16/2023 Target Resolution Date: 12/03/2022 Goal Status: Met Ulcer/skin breakdown will have a volume reduction of 30% by week 4 Date Initiated: 11/26/2022 Target Resolution Date: 12/24/2022 Goal Status: Active Ulcer/skin breakdown will have a volume reduction of 50% by week 8 Date Initiated: 11/26/2022 Target Resolution Date: 01/21/2023 Goal Status: Active Ulcer/skin breakdown will have a volume  reduction of 80% by week 12 Date Initiated: 11/26/2022 Target Resolution Date: 02/18/2023 Goal Status: Active Ulcer/skin breakdown will heal within 14 weeks Date Initiated: 11/26/2022 Target Resolution Date: 03/18/2023 Goal Status: Active Interventions: Assess patient/caregiver ability to obtain necessary supplies Assess patient/caregiver ability to perform ulcer/skin care regimen upon admission and as needed Assess ulceration(s) every visit Notes: Electronic Signature(s) Signed: 02/06/2023 4:31:55 PM By: Kathrine Haddock, [name] ([id number]) [id number] Page 5 of 7 Entered By: Levora Dredge on 02/06/2023 13:45:14 -------------------------------------------------------------------------------- Pain Assessment Details Patient Name: Date of Service: [name], [name] 02/06/2023 1:00 PM Medical Record Number: [id number] Patient Account Number: [id number] Date of Birth/Sex: Treating RN: [birth year] (77 y.o. [name] Primary Care Tagen Brethauer: [name] Other Clinician: Referring Cheston Coury: Treating Glendal Cassaday/Extender: [name] in Treatment: 10 Active Problems Location of Pain Severity and Description of Pain Patient Has Paino No Site Locations Rate the pain. Current Pain Level: 0 Pain Management and Medication Current Pain Management: Electronic Signature(s) Signed: 02/06/2023 4:31:55 PM By: Levora Dredge Entered By: Levora Dredge on 02/06/2023 13:05:48 -------------------------------------------------------------------------------- Patient/Caregiver Education Details Patient Name: Date of Service: [name], CLA Fredda Hammed 3/28/2024andnbsp1:00 PM Medical Record Number: [id number] Patient Account Number: [id number] Date of Birth/Gender: Treating RN: [birth year] (77 y.o. [name] Primary Care Physician: [name] Other Clinician: Referring Physician: Treating Physician/Extender: [name] in Treatment: 10 Education Assessment Education Provided To: Patient and Caregiver Education Topics Provided Nutrition: Handouts: Nutrition Methods: Explain/Verbal Responses: State content correctly Offloading: Handouts: How Offloading Helps Foot Wounds Heal Methods: Explain/Verbal Responses: State content correctly EATHON, EVELAND ([id number]) [phone].pdf Page 6 of 7 Pressure: Handouts: Pressure Injury: Prevention and Offloading Methods: Explain/Verbal Responses: State content correctly Wound/Skin Impairment: Handouts: Caring for Your Ulcer Methods: Explain/Verbal Responses: State content correctly Electronic Signature(s) Signed: 02/06/2023 4:31:55 PM By: Levora Dredge Entered By: Levora Dredge on 02/06/2023 13:45:20 -------------------------------------------------------------------------------- Wound Assessment Details Patient Name: Date of Service: [name], [name] 02/06/2023 1:00 PM Medical Record Number: [id number] Patient Account Number: [id number] Date of Birth/Sex: Treating RN: 02/08/1946 (77 y.o. [name] Primary Care Anaya Bovee: [name] Other Clinician: Referring Ashaz Robling: Treating Haliey Romberg/Extender: [name] in Treatment: 10 Wound Status Wound Number: 5 Primary Pressure Ulcer Etiology: Wound Location: Left Calcaneus Wound Open Wounding Event: Pressure Injury Status: Date Acquired: 10/20/2022 Comorbid Coronary Artery Disease, Hypertension, Type I Diabetes, Weeks Of Treatment: 10 History: Osteoarthritis, Received Chemotherapy Clustered Wound: No Photos Wound Measurements Length: (cm) 2.3 Width: (cm) 1.3 Depth: (cm) 0.5 Area: (cm) 2.348 Volume: (cm) 1.174 % Reduction in Area: 50.2% % Reduction in Volume: -149.3% Epithelialization: Small (1-33%) Tunneling: No Undermining: No Wound Description Classification: Category/Stage III Exudate  Amount: Medium Exudate Type: Serosanguineous Exudate Color: red, brown Foul Odor After Cleansing: No  Slough/Fibrino Yes Wound Bed Granulation Amount: None Present (0%) Exposed Structure Necrotic Amount: Large (67-100%) Fascia Exposed: No Necrotic Quality: Adherent Slough Fat Layer (Subcutaneous Tissue) Exposed: Yes Tendon Exposed: No Muscle Exposed: No Joint Exposed: No Bone Exposed: No Treatment Notes Koike, [place] H ([id number]) [phone].pdf Page 7 of 7 Wound #5 (Calcaneus) Wound Laterality: Left Cleanser Soap and Water Discharge Instruction: Gently cleanse wound with antibacterial soap, rinse and pat dry prior to dressing wounds Peri-Wound Care Topical Primary Dressing Hydrofera Blue Ready Transfer Foam, 2.5x2.5 (in/in) Discharge Instruction: Apply Hydrofera Blue Ready to wound bed as directed Please cut a small cirle to fit in wound bed and then place a square piece over top Secondary Dressing (BORDER) Zetuvit Plus SILICONE BORDER Dressing 4x4 (in/in) Discharge Instruction: Please do not put silicone bordered dressings under wraps. Use non-bordered dressing only. Secured With Compression Wrap Compression Stockings Environmental education officer) Signed: 02/06/2023 4:31:55 PM By: Levora Dredge Entered By: Levora Dredge on 02/06/2023 13:14:58 -------------------------------------------------------------------------------- Vitals Details Patient Name: Date of Service: [name], [name] 02/06/2023 1:00 PM Medical Record Number: [id number] Patient Account Number: [id number] Date of Birth/Sex: Treating RN: 11-21-1945 (77 y.o. [name] Primary Care Stasia Somero: [name] Other Clinician: Referring Yarel Rushlow: Treating Jaylynne Birkhead/Extender: [name] in Treatment: 10 Vital Signs Time Taken: 13:03 Temperature (F): 97.5 Height (in): 69 Pulse (bpm): 89 Weight (lbs): 190 Respiratory Rate (breaths/min):  18 Body Mass Index (BMI): 28.1 Blood Pressure (mmHg): 100/57 Reference Range: 80 - 120 mg / dl Electronic Signature(s) Signed: 02/06/2023 4:31:55 PM By: Levora Dredge Entered By: Levora Dredge on 02/06/2023 13:05:41

## 2023-02-08 DIAGNOSIS — C679 Malignant neoplasm of bladder, unspecified: Secondary | ICD-10-CM | POA: Diagnosis not present

## 2023-02-08 DIAGNOSIS — L89620 Pressure ulcer of left heel, unstageable: Secondary | ICD-10-CM | POA: Diagnosis not present

## 2023-02-08 DIAGNOSIS — I82452 Acute embolism and thrombosis of left peroneal vein: Secondary | ICD-10-CM | POA: Diagnosis not present

## 2023-02-08 DIAGNOSIS — J439 Emphysema, unspecified: Secondary | ICD-10-CM | POA: Diagnosis not present

## 2023-02-08 DIAGNOSIS — I110 Hypertensive heart disease with heart failure: Secondary | ICD-10-CM | POA: Diagnosis not present

## 2023-02-08 DIAGNOSIS — E1065 Type 1 diabetes mellitus with hyperglycemia: Secondary | ICD-10-CM | POA: Diagnosis not present

## 2023-02-08 DIAGNOSIS — I5022 Chronic systolic (congestive) heart failure: Secondary | ICD-10-CM | POA: Diagnosis not present

## 2023-02-08 DIAGNOSIS — L89610 Pressure ulcer of right heel, unstageable: Secondary | ICD-10-CM | POA: Diagnosis not present

## 2023-02-08 DIAGNOSIS — I951 Orthostatic hypotension: Secondary | ICD-10-CM | POA: Diagnosis not present

## 2023-02-10 ENCOUNTER — Telehealth: Payer: Self-pay | Admitting: Cardiovascular Disease

## 2023-02-10 NOTE — Telephone Encounter (Signed)
Left a message for the patient to call back.  

## 2023-02-10 NOTE — Telephone Encounter (Signed)
Pt c/o medication issue:  1. Name of Medication: metoprolol succinate (TOPROL XL) 25 MG 24 hr tablet  2. How are you currently taking this medication (dosage and times per day)? As prescribed  3. Are you having a reaction (difficulty breathing--STAT)? No  4. What is your medication issue? Pt states that when he takes the medication, he gets dizzy, to where he almost falls. Pt states that he has been taking the midodrine (PROAMATINE) 10 MG tablet to help with the dizziness. Pt states he will not take anymore of metoprolol until he hears back from the office.

## 2023-02-11 ENCOUNTER — Encounter: Payer: Self-pay | Admitting: Infectious Diseases

## 2023-02-11 ENCOUNTER — Ambulatory Visit: Payer: Medicare HMO | Attending: Infectious Diseases | Admitting: Infectious Diseases

## 2023-02-11 ENCOUNTER — Other Ambulatory Visit
Admission: RE | Admit: 2023-02-11 | Discharge: 2023-02-11 | Disposition: A | Discharge status: Home / Self Care | Payer: Medicare HMO | Source: Ambulatory Visit | Attending: Infectious Diseases | Admitting: Infectious Diseases

## 2023-02-11 VITALS — BP 109/68 | HR 90 | Temp 96.8°F | Ht 69.0 in | Wt 177.0 lb

## 2023-02-11 DIAGNOSIS — E119 Type 2 diabetes mellitus without complications: Secondary | ICD-10-CM | POA: Insufficient documentation

## 2023-02-11 DIAGNOSIS — M00062 Staphylococcal arthritis, left knee: Secondary | ICD-10-CM | POA: Insufficient documentation

## 2023-02-11 DIAGNOSIS — E109 Type 1 diabetes mellitus without complications: Secondary | ICD-10-CM | POA: Insufficient documentation

## 2023-02-11 DIAGNOSIS — A4902 Methicillin resistant Staphylococcus aureus infection, unspecified site: Secondary | ICD-10-CM | POA: Insufficient documentation

## 2023-02-11 DIAGNOSIS — D649 Anemia, unspecified: Secondary | ICD-10-CM | POA: Diagnosis not present

## 2023-02-11 DIAGNOSIS — Z7901 Long term (current) use of anticoagulants: Secondary | ICD-10-CM | POA: Insufficient documentation

## 2023-02-11 DIAGNOSIS — Z955 Presence of coronary angioplasty implant and graft: Secondary | ICD-10-CM | POA: Insufficient documentation

## 2023-02-11 DIAGNOSIS — I2510 Atherosclerotic heart disease of native coronary artery without angina pectoris: Secondary | ICD-10-CM | POA: Insufficient documentation

## 2023-02-11 DIAGNOSIS — L89629 Pressure ulcer of left heel, unspecified stage: Secondary | ICD-10-CM | POA: Diagnosis not present

## 2023-02-11 DIAGNOSIS — Z86718 Personal history of other venous thrombosis and embolism: Secondary | ICD-10-CM | POA: Insufficient documentation

## 2023-02-11 LAB — COMPREHENSIVE METABOLIC PANEL
ALT: 15 U/L (ref 0–44)
AST: 21 U/L (ref 15–41)
Albumin: 2.7 g/dL — ABNORMAL LOW (ref 3.5–5.0)
Alkaline Phosphatase: 145 U/L — ABNORMAL HIGH (ref 38–126)
Anion gap: 10 (ref 5–15)
BUN: 49 mg/dL — ABNORMAL HIGH (ref 8–23)
CO2: 20 mmol/L — ABNORMAL LOW (ref 22–32)
Calcium: 8.7 mg/dL — ABNORMAL LOW (ref 8.9–10.3)
Chloride: 108 mmol/L (ref 98–111)
Creatinine, Ser: 1.23 mg/dL (ref 0.61–1.24)
GFR, Estimated: >60 mL/min (ref >60)
Glucose, Bld: 191 mg/dL — ABNORMAL HIGH (ref 70–99)
Potassium: 4.5 mmol/L (ref 3.5–5.1)
Sodium: 138 mmol/L (ref 135–145)
Total Bilirubin: 0.6 mg/dL (ref 0.3–1.2)
Total Protein: 5.8 g/dL — ABNORMAL LOW (ref 6.5–8.1)

## 2023-02-11 LAB — CBC WITH DIFFERENTIAL/PLATELET
Abs Immature Granulocytes: 0.07 10*3/uL (ref 0.00–0.07)
Basophils Absolute: 0.1 10*3/uL (ref 0.0–0.1)
Basophils Relative: 1 %
Eosinophils Absolute: 0.2 10*3/uL (ref 0.0–0.5)
Eosinophils Relative: 3 %
HCT: 33.2 % — ABNORMAL LOW (ref 39.0–52.0)
Hemoglobin: 10.8 g/dL — ABNORMAL LOW (ref 13.0–17.0)
Immature Granulocytes: 1 %
Lymphocytes Relative: 17 %
Lymphs Abs: 1.6 10*3/uL (ref 0.7–4.0)
MCH: 30.7 pg (ref 26.0–34.0)
MCHC: 32.5 g/dL (ref 30.0–36.0)
MCV: 94.3 fL (ref 80.0–100.0)
Monocytes Absolute: 1.1 10*3/uL — ABNORMAL HIGH (ref 0.1–1.0)
Monocytes Relative: 11 %
Neutro Abs: 6.3 10*3/uL (ref 1.7–7.7)
Neutrophils Relative %: 67 %
Platelets: 351 10*3/uL (ref 150–400)
RBC: 3.52 MIL/uL — ABNORMAL LOW (ref 4.22–5.81)
RDW: 16.2 % — ABNORMAL HIGH (ref 11.5–15.5)
WBC: 9.3 10*3/uL (ref 4.0–10.5)
nRBC: 0 % (ref 0.0–0.2)

## 2023-02-11 LAB — SEDIMENTATION RATE: Sed Rate: 22 mm/hr — ABNORMAL HIGH (ref 0–20)

## 2023-02-11 LAB — C-REACTIVE PROTEIN: CRP: 3.6 mg/dL — ABNORMAL HIGH (ref <1.0)

## 2023-02-11 NOTE — Patient Instructions (Addendum)
Today we will do labs and will see whether you need to continue Doxy Follow up appt PRN
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
## 2023-02-12 ENCOUNTER — Other Ambulatory Visit: Payer: Self-pay | Admitting: Infectious Diseases

## 2023-02-12 ENCOUNTER — Telehealth: Payer: Self-pay

## 2023-02-12 NOTE — Telephone Encounter (Signed)
-----   Message from [place], [place] sent at 02/12/2023  2:08 PM EDT -----  ----- Message ----- From: Tsosie Billing, MD Sent: 02/12/2023  10:57 AM EDT To: Allegra Grana, CMA  Please let him that his inflammatory markers have improved- I will keep him on Doxy for some more time till hte heel wound is healed- I am sending more refills on Doxy- thx ----- Message ----- From: Interface, [facility] [place] Sent: 02/11/2023  11:01 AM EDT To: Tsosie Billing, MD

## 2023-02-12 NOTE — Telephone Encounter (Signed)
Spoke with [name], relayed that inflammatory markers have improved, but that Dr. Delaine Lame would like him to continue on the doxycycline until the wound heals and refills have been sent in. Patient verbalized understanding and has no further questions.   Beryle Flock, RN

## 2023-02-13 ENCOUNTER — Other Ambulatory Visit: Payer: Self-pay | Admitting: Physician Assistant

## 2023-02-13 ENCOUNTER — Ambulatory Visit
Admission: RE | Admit: 2023-02-13 | Discharge: 2023-02-13 | Disposition: A | Discharge status: Home / Self Care | Payer: Medicare HMO | Source: Ambulatory Visit | Attending: Physician Assistant | Admitting: Physician Assistant

## 2023-02-13 ENCOUNTER — Encounter: Payer: Medicare HMO | Attending: Physician Assistant | Admitting: Physician Assistant

## 2023-02-13 DIAGNOSIS — L98412 Non-pressure chronic ulcer of buttock with fat layer exposed: Secondary | ICD-10-CM | POA: Diagnosis not present

## 2023-02-13 DIAGNOSIS — E10621 Type 1 diabetes mellitus with foot ulcer: Secondary | ICD-10-CM | POA: Insufficient documentation

## 2023-02-13 DIAGNOSIS — I5042 Chronic combined systolic (congestive) and diastolic (congestive) heart failure: Secondary | ICD-10-CM | POA: Insufficient documentation

## 2023-02-13 DIAGNOSIS — L89623 Pressure ulcer of left heel, stage 3: Secondary | ICD-10-CM | POA: Diagnosis not present

## 2023-02-13 DIAGNOSIS — E785 Hyperlipidemia, unspecified: Secondary | ICD-10-CM | POA: Insufficient documentation

## 2023-02-13 DIAGNOSIS — S91302A Unspecified open wound, left foot, initial encounter: Secondary | ICD-10-CM

## 2023-02-13 DIAGNOSIS — I110 Hypertensive heart disease with heart failure: Secondary | ICD-10-CM | POA: Diagnosis not present

## 2023-02-13 DIAGNOSIS — M1990 Unspecified osteoarthritis, unspecified site: Secondary | ICD-10-CM | POA: Diagnosis not present

## 2023-02-13 DIAGNOSIS — E1051 Type 1 diabetes mellitus with diabetic peripheral angiopathy without gangrene: Secondary | ICD-10-CM | POA: Insufficient documentation

## 2023-02-13 DIAGNOSIS — I2510 Atherosclerotic heart disease of native coronary artery without angina pectoris: Secondary | ICD-10-CM | POA: Diagnosis not present

## 2023-02-13 DIAGNOSIS — M720 Palmar fascial fibromatosis [Dupuytren]: Secondary | ICD-10-CM | POA: Insufficient documentation

## 2023-02-13 DIAGNOSIS — L89613 Pressure ulcer of right heel, stage 3: Secondary | ICD-10-CM | POA: Diagnosis not present

## 2023-02-13 DIAGNOSIS — E10622 Type 1 diabetes mellitus with other skin ulcer: Secondary | ICD-10-CM | POA: Diagnosis not present

## 2023-02-13 NOTE — Progress Notes (Addendum)
IKAIKA, SHOWERS ([id number]) [id number] Page 1 of 8 Visit Report for 02/13/2023 Chief Complaint Document Details Patient Name: Date of Service: [name], [name] 02/13/2023 12:00 PM Medical Record Number: [id number] Patient Account Number: [id number] Date of Birth/Sex: Treating RN: 1945/12/07 (77 y.o. [name] Primary Care Provider: [name] Other Clinician: Referring Provider: Treating Provider/Extender: [name] in Treatment: 11 Information Obtained from: Patient Chief Complaint Left heel pressure ulcer Electronic Signature(s) Signed: 02/13/2023 12:40:33 PM By: Lenda Kelp [name] Previous Signature: 02/13/2023 12:40:07 PM Version By: Lenda Kelp [name] Entered By: Lenda Kelp on 02/13/2023 12:40:33 -------------------------------------------------------------------------------- Debridement Details Patient Name: Date of Service: [name], [name] 02/13/2023 12:00 PM Medical Record Number: [id number] Patient Account Number: [id number] Date of Birth/Sex: Treating RN: May 24, 1946 (77 y.o. [name] Primary Care Provider: [name] Other Clinician: Referring Provider: Treating Provider/Extender: [name] in Treatment: 11 Debridement Performed for Assessment: Wound #5 Left Calcaneus Performed By: Physician Nelida Meuse., [name] Debridement Type: Debridement Level of Consciousness (Pre-procedure): Awake and Alert Pre-procedure Verification/Time Out Yes - 12:43 Taken: Pain Control: Lidocaine 4% T opical Solution T Area Debrided (L x W): otal 1.8 (cm) x 1.1 (cm) = 1.98 (cm) Tissue and other material debrided: Viable, Non-Viable, Slough, Subcutaneous, Slough Level: Skin/Subcutaneous Tissue Debridement Description: Excisional Instrument: Curette Bleeding: Minimum Hemostasis Achieved: Pressure Response to Treatment: Procedure was tolerated well Level of  Consciousness (Post- Awake and Alert procedure): Post Debridement Measurements of Total Wound Length: (cm) 1.8 Stage: Category/Stage III Width: (cm) 1.1 Depth: (cm) 0.6 Volume: (cm) 0.933 Character of Wound/Ulcer Post Debridement: Stable Post Procedure Diagnosis Same as Pre-procedure Electronic Signature(s) Signed: 02/13/2023 4:55:39 PM By: Angelina Pih Signed: 02/14/2023 8:47:01 AM By: Lenda Kelp [name] Entered By: Angelina Pih on 02/13/2023 12:49:48 Kady, [name] ([id number]) [id number] Page 2 of 8 -------------------------------------------------------------------------------- HPI Details Patient Name: Date of Service: [name], [name] 02/13/2023 12:00 PM Medical Record Number: [id number] Patient Account Number: [id number] Date of Birth/Sex: Treating RN: 1945-12-09 (77 y.o. [name] Primary Care Provider: [name] Other Clinician: Referring Provider: Treating Provider/Extender: [name] in Treatment: 11 History of Present Illness HPI Description: 77 year old male recently seen by his PCP Dr. [name] who saw him for a wound on the right leg which is less tender and he is continuing to use bacitracin and has completed her course of doxycycline. Past medical history of coronary artery disease, diabetes mellitus type 1, hyperlipidemia, hypertension, MI, plantar fascial fibromatosis, pneumonia, status post foot surgery due to trauma on the left side, coronary angioplasty. he is a former smoker and quit in July 1996. last hemoglobin A1c was 7.8 % in April of this year 5/22 2017 -- biopsy of the right lower extremity was sent for pathology and the report is that of a basal cell carcinoma with edges of the biopsy are involved. I have called the patient over the phone( 03/28/16) and discussed the pathology report with him and he is agreeable to see a surgeon for excision and need full. I have  also spoken personally to [name], of [facility] surgical and referred the patient for further wide excision of this lesion and have communicated this to the patient. READMISSION 08/12/18 This is a now 77 year old man with type 1 diabetes. Hemoglobin A1c on 4/29 was 7.9. He is listed as having a history of PAD in the [place] records although the patient doesn't recall this, doesn't  complain of any symptoms of claudication and doesn't believe he has had any prior noninvasive arterial test. He is a nonsmoker. He was here in 2017 with a wound on his Right lower leg. This was biopsied in the clinic and pathology showed a basal cell carcinoma. He went on to have surgery on this area this is currently where I believe his current wounds are located. He generally bumped the lateral part of his right calf 2 weeks ago and has a superficial abrasion. He also has 2 small open areas on the medial part of the tibia in the same area. Our intake nurse noted a stitch coming out of this which was removed. These of the wound sees actually come to the clinic for. However the more concerning area is an area on the tip of the left great toe. He says that this was initially traumatic and he's been to see [name]. [name] been using what I think is Santyl to the wound tip. This has some depth. ABIs in this clinic were non-obtainable on the right and 1.84 on the left. 08/19/18; the patient had arterial studies that did not show evidence of significant hemodynamically compromising occlusions in either leg. There was mild medial atherosclerosis bilaterally. His resting ABIs were within the normal limits at 1.3 on the right and 1.4 on the left. There was normal posterior and anterior tibial artery waveforms and velocities listed on both sides. The patient arrives with what appears to be a healthy surface over the tip of his left great toe. This is indeed surprising. Still looks somewhat friable however. More  concerning is the x-ray that we did that showed erosions of the tuft of the distal phalanx the left great toe consistent with osteomyelitis. I attempted to pull this x-ray up on colon health Link however I can't open the film to look at this myself. 08/26/18; I reviewed the patient's x-ray and colon helpline. Indeed there is fairly obvious erosion of the tip of his left great toe. The wound was initially a traumatic area hitting it sometime in the middle of night in his kitchen. He is a type I diabetic. I have him on Flagyl and Levaquin that I started last week i.e. 7 days ago. He has an appointment with infectious disease on 09/01/18 09/02/18; the patient was seen by [name] of infectious disease. She did not feel the patient needed IV antibiotics. I do feel he needs further oral antibiotics however. He does not need anymore Flagyl but he does need another 2 weeks of Levaquin. The areas just about closed. 09/16/18; the patient is completing his Levaquin today. There is no open wound on the tip of the toe Readmission: 11-26-2022 upon evaluation today patient presents for initial inspection here in our clinic concerning issues that he has been having after having been hospitalized due to septic arthritis. Subsequently he has a PICC line that is been removed and he has been discharged from infectious disease in that regard. Unfortunately he developed shearing wounds over the gluteal region as well as a pressure ulcer over each heel although they are not looking extremely bad nonetheless they do seem to be evidence of pressure which she sustained while hospitalized. Obviously he was very sick and is still recovering as far as that is concerned. Fortunately there does not appear to be any evidence of active infection systemically which is good news at this point according to Dr. [name]. Patient's wounds also do not appear to be too bad  at this point which is good news. Patient does have a  history of type 1 diabetes mellitus, peripheral vascular disease, coronary artery disease, congestive heart failure, hypertension, and he is on long-term anticoagulant therapy. 12-03-2022 upon evaluation today patient appears to be doing better to some degree in regard to his ulcers on his heels at this point. We are still waiting on the actual appointment with the vascular surgeons but nonetheless in the meantime I do believe that he is making some good progress currently with regard to loosen up the eschar. I did actually feel like that there was some ability for [place] to remove some of the necrotic tissue today and I discussed that with the patient. He is in agreement with that plan. We also can probably see about making an adjustment in the treatment regimen. 12-10-2022 upon evaluation today patient's wounds were really doing about the same. Again he is still awaiting the evaluation with the vascular surgeon. That should have already been done but had to be pushed back unfortunately. He will be seeing them next week on the seventh. I plan to see him following somewhere around the end of the week I think will probably be best. The patient voiced understanding. 12-19-2022 upon evaluation today patient appears to be doing well currently in regard to his wound we did get the results of his arterial studies which show that he has excellent ABIs with no signs of vascular compromise. For that reason I will go ahead and perform some debridement today to clearway the necrotic debris with his Eliquis that I will be too aggressive so this will still be a stepwise process but we are to go ahead and get that started. 12-27-2022 upon evaluation today patient appears to be doing well currently in regard to his wounds. They are actually showing signs of excellent improvement. There is some necrotic tissue noted on the surface of wounds to work on clearing some of that away today also I think that we may switch to [facility] dressing to see how things do going forward. He is in agreement with that plan. Subsequently I am extremely pleased with where we stand currently. No fevers, chills, nausea, vomiting, or diarrhea. 01-02-2023 upon evaluation today patient appears to be doing much better in regard to his wounds the right foot appears to be almost healed the left foot though not completely healed is significantly better. Fortunately there does not appear to be any signs of active infection at this point. 01-10-2023 upon evaluation today patient appears to be doing well currently in regard to his wounds. The right heel actually is completely healed the left heel is definitely headed in the right direction and looks to be doing awesome. I do not see any signs of active infection locally nor systemically at this time which is great news. THIAGO, CURE ([id number]) [id number] Page 3 of 8 3/7; his right heel has remained healed. There is no evidence of active infection. The remaining wound is on the lateral aspect of the heel just above the plantar surface 01-30-2023 upon evaluation today patient appears to be doing well currently in regard to his wound. The heel actually showing signs of improvement which is great news and fortunately I do not see any evidence of active infection locally nor systemically at this time. Overall I think that we are headed in the right direction which is excellent here. No fevers, chills, nausea, vomiting, or diarrhea. I am good have to perform some sharp debridement  today. This is just the left heel that is remaining open. 02-06-2023 upon evaluation today patient appears to be doing well currently in regard to his wound. Has been tolerating the dressing changes without complication. Fortunately there does not appear to be any signs of active infection locally nor systemically although he does have some need for sharp debridement with some definite necrotic  tissue at the base of the wound today. 02-13-2023 upon evaluation today patient appears to be doing well currently in regard to his wound which I feel like is slowly clearing up. I did review his growth labs as well the sed rate and C-reactive protein are down his white blood cell count was normal his hemoglobin is coming up. He did see Dr. Joylene Draft on Tuesday and she did continue him on the doxycycline considering his lab numbers but nonetheless he seems to be making some pretty good progress here in my opinion. The wound is slowly turnaround serial debridements are obviously helping with that being said I do believe that he is going to still require dressing changes and close monitoring. Electronic Signature(s) Signed: 02/13/2023 6:13:29 PM By: Lenda Kelp [name] Entered By: Lenda Kelp on 02/13/2023 18:13:29 -------------------------------------------------------------------------------- Physical Exam Details Patient Name: Date of Service: [name], [name] 02/13/2023 12:00 PM Medical Record Number: [id number] Patient Account Number: [id number] Date of Birth/Sex: Treating RN: [birth year] (77 y.o. [name] Primary Care Provider: [name] Other Clinician: Referring Provider: Treating Provider/Extender: [name] in Treatment: 11 Constitutional Well-nourished and well-hydrated in no acute distress. Respiratory normal breathing without difficulty. Psychiatric this patient is able to make decisions and demonstrates good insight into disease process. Alert and Oriented x 3. pleasant and cooperative. Notes Upon inspection patient's wound bed actually showed signs of good granulation epithelization at this point. Fortunately I do not see any signs of active infection at this time which is great news. With that being said I do think that an x-ray would not be a bad idea just to ensure that everything appears to be doing okay. Fortunately I do not  see any signs of systemic infection either. Electronic Signature(s) Signed: 02/13/2023 6:14:29 PM By: Lenda Kelp [name] Entered By: Lenda Kelp on 02/13/2023 18:14:29 -------------------------------------------------------------------------------- Physician Orders Details Patient Name: Date of Service: [name], [name] 02/13/2023 12:00 PM Medical Record Number: [id number] Patient Account Number: [id number] Date of Birth/Sex: Treating RN: 03/30/1946 (77 y.o. 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 Nurse Visit as needed Home Health [facility] for wound care. May utilize formulary equivalent dressing for wound treatment orders unless otherwise specified. Home Health Nurse may visit PRN to address patients wound care needs. Frances Furbish fax [phone] [facility] Nurse. PLEASE change dressing on the left heel when you visit unless partner has already changed dressing. Apply Hydrofera blue (cut small piece to fit  in wound bed, then cover with a square piece of hydrofera blue) and cover with a bordered foam dressing. Reapply compression stockings Bathing/ Shower/ Hygiene May  shower; gently cleanse wound with antibacterial soap, rinse and pat dry prior to dressing wounds No tub bath. - no soaking foot in tub Anesthetic (Use 'Patient Medications' Section for Anesthetic Order Entry) Lidocaine applied to wound bed Edema Control - Lymphedema / Segmental Compressive Device / Other Elevate, Exercise Daily and A void Standing for Long Periods of Time. Elevate legs to the level of the heart and pump ankles as often as possible Elevate leg(s) parallel to the floor when sitting. DO YOUR BEST to sleep in the bed at night. DO NOT sleep in your recliner. Long hours of sitting in a recliner leads to swelling of the legs and/or potential wounds on your backside. Wound Treatment Wound #5 - Calcaneus Wound Laterality: Left Cleanser: Soap and Water 1 x Per Day/30 Days Discharge Instructions: Gently cleanse wound with antibacterial soap, rinse and pat dry prior to dressing wounds Prim Dressing: Hydrofera Blue Ready Transfer Foam, 2.5x2.5 (in/in) (Generic) 1 x Per Day/30 Days ary Discharge Instructions: Apply Hydrofera Blue Ready to wound bed as directed Please cut a small cirle to fit in wound bed and then place a square piece over top Secondary Dressing: (BORDER) Zetuvit Plus SILICONE BORDER Dressing 4x4 (in/in) (Generic) 1 x Per Day/30 Days Discharge Instructions: Please do not put silicone bordered dressings under wraps. Use non-bordered dressing only. Radiology X-ray, foot - left foot/heel Electronic Signature(s) Signed: 02/13/2023 1:36:09 PM By: Angelina Pih Signed: 02/14/2023 8:47:01 AM By: Lenda Kelp [name] Entered By: Angelina Pih on 02/13/2023 13:36:08 -------------------------------------------------------------------------------- Problem List Details Patient Name: Date of Service: [name], [name] 02/13/2023 12:00 PM Medical Record Number: [id number] Patient Account Number: [id number] Date of Birth/Sex: Treating RN: 05-29-1946 (77 y.o. [name] Primary Care Provider: [name] Other Clinician: Referring Provider: Treating Provider/Extender: [name] in Treatment: 11 Active Problems ICD-10 Encounter Code Description Active Date MDM Diagnosis E10.621 Type 1 diabetes mellitus with foot ulcer 11/26/2022 No Yes I73.89 Other specified peripheral vascular diseases 11/26/2022 No Yes Gittens, Jaelon [name] ([id number]) [id number] Page 5 of 8 [phone] Pressure ulcer of left heel, stage 3 11/26/2022 No Yes L89.613 Pressure ulcer of right heel, stage 3 11/26/2022 No Yes L98.412 Non-pressure chronic ulcer of buttock with fat layer exposed 11/26/2022 No Yes I25.10 Atherosclerotic heart disease of native coronary artery without angina pectoris 11/26/2022 No Yes I50.42 Chronic combined systolic (congestive) and diastolic (congestive) heart failure 11/26/2022 No Yes I10 Essential (primary) hypertension 11/26/2022 No Yes Inactive Problems Resolved Problems Electronic Signature(s) Signed: 02/13/2023 12:40:03 PM By: Lenda Kelp [name] Entered By: Lenda Kelp on 02/13/2023 12:40:03 -------------------------------------------------------------------------------- Progress Note Details Patient Name: Date of Service: [name], [name] 02/13/2023 12:00 PM Medical Record Number: [id number] Patient Account Number: [id number] Date of Birth/Sex: Treating RN: [birth year] (77 y.o. [name] Primary Care Provider: [name] Other Clinician: Referring Provider: Treating Provider/Extender: [name] in Treatment: 11 Subjective Chief Complaint Information obtained from Patient Left heel pressure ulcer History of Present Illness (HPI) 77 year old male recently seen by his PCP Dr. [name] who saw him for a wound on the right leg which is less tender and he is continuing to use bacitracin and has completed her course of doxycycline. Past medical  history of coronary artery disease, diabetes mellitus type 1, hyperlipidemia, hypertension, MI, plantar fascial fibromatosis,  pneumonia, status post foot surgery due to trauma on the left side, coronary angioplasty. he is a former smoker and quit in July 1996. last hemoglobin A1c was 7.8 % in April of this year 5/22 2017 -- biopsy of the right lower extremity was sent for pathology and the report is that of a basal cell carcinoma with edges of the biopsy are involved. I have called the patient over the phone( 03/28/16) and discussed the pathology report with him and he is agreeable to see a surgeon for excision and need full. I have also spoken personally to [name], of [facility] surgical and referred the patient for further wide excision of this lesion and have communicated this to the patient. READMISSION 08/12/18 This is a now 77 year old man with type 1 diabetes. Hemoglobin A1c on 4/29 was 7.9. He is listed as having a history of PAD in the  records although the patient doesn't recall this, doesn't complain of any symptoms of claudication and doesn't believe he has had any prior noninvasive arterial test. He is a nonsmoker. He was here in 2017 with a wound on his Right lower leg. This was biopsied in the clinic and pathology showed a basal cell carcinoma. He went on to have surgery on this area this is currently where I believe his current wounds are located. He generally bumped the lateral part of his right calf 2 weeks ago and has a superficial abrasion. He also has 2 small open areas on the medial part of the tibia in the same area. Our intake nurse noted a stitch coming out of this which was removed. These of the wound sees actually come to the clinic for. However the more concerning area is an area on the tip of the left great toe. He says that this was initially traumatic and he's been to see [name]. [name] been using what I think is Santyl to the wound tip. This has  some depth. ABIs in this clinic were non-obtainable on the right and 1.84 on the left. 08/19/18; the patient had arterial studies that did not show evidence of significant hemodynamically compromising occlusions in either leg. There was mild LYSLE, YERO ([id number]) [id number] Page 6 of 8 medial atherosclerosis bilaterally. His resting ABIs were within the normal limits at 1.3 on the right and 1.4 on the left. There was normal posterior and anterior tibial artery waveforms and velocities listed on both sides. The patient arrives with what appears to be a healthy surface over the tip of his left great toe. This is indeed surprising. Still looks somewhat friable however. More concerning is the x-ray that we did that showed erosions of the tuft of the distal phalanx the left great toe consistent with osteomyelitis. I attempted to pull this x-ray up on colon health Link however I can't open the film to look at this myself. 08/26/18; I reviewed the patient's x-ray and colon helpline. Indeed there is fairly obvious erosion of the tip of his left great toe. The wound was initially a traumatic area hitting it sometime in the middle of night in his kitchen. He is a type I diabetic. I have him on Flagyl and Levaquin that I started last week i.e. 7 days ago. He has an appointment with infectious disease on 09/01/18 09/02/18; the patient was seen by [name] of infectious disease. She did not feel the patient needed IV antibiotics. I do feel he needs further oral antibiotics however. He does not need anymore Flagyl  but he does need another 2 weeks of Levaquin. The areas just about closed. 09/16/18; the patient is completing his Levaquin today. There is no open wound on the tip of the toe Readmission: 11-26-2022 upon evaluation today patient presents for initial inspection here in our clinic concerning issues that he has been having after having been hospitalized due to  septic arthritis. Subsequently he has a PICC line that is been removed and he has been discharged from infectious disease in that regard. Unfortunately he developed shearing wounds over the gluteal region as well as a pressure ulcer over each heel although they are not looking extremely bad nonetheless they do seem to be evidence of pressure which she sustained while hospitalized. Obviously he was very sick and is still recovering as far as that is concerned. Fortunately there does not appear to be any evidence of active infection systemically which is good news at this point according to Dr. [name]. Patient's wounds also do not appear to be too bad at this point which is good news. Patient does have a history of type 1 diabetes mellitus, peripheral vascular disease, coronary artery disease, congestive heart failure, hypertension, and he is on long-term anticoagulant therapy. 12-03-2022 upon evaluation today patient appears to be doing better to some degree in regard to his ulcers on his heels at this point. We are still waiting on the actual appointment with the vascular surgeons but nonetheless in the meantime I do believe that he is making some good progress currently with regard to loosen up the eschar. I did actually feel like that there was some ability for [place] to remove some of the necrotic tissue today and I discussed that with the patient. He is in agreement with that plan. We also can probably see about making an adjustment in the treatment regimen. 12-10-2022 upon evaluation today patient's wounds were really doing about the same. Again he is still awaiting the evaluation with the vascular surgeon. That should have already been done but had to be pushed back unfortunately. He will be seeing them next week on the seventh. I plan to see him following somewhere around the end of the week I think will probably be best. The patient voiced understanding. 12-19-2022 upon evaluation today patient  appears to be doing well currently in regard to his wound we did get the results of his arterial studies which show that he has excellent ABIs with no signs of vascular compromise. For that reason I will go ahead and perform some debridement today to clearway the necrotic debris with his Eliquis that I will be too aggressive so this will still be a stepwise process but we are to go ahead and get that started. 12-27-2022 upon evaluation today patient appears to be doing well currently in regard to his wounds. They are actually showing signs of excellent improvement. There is some necrotic tissue noted on the surface of wounds to work on clearing some of that away today also I think that we may switch to [facility] dressing to see how things do going forward. He is in agreement with that plan. Subsequently I am extremely pleased with where we stand currently. No fevers, chills, nausea, vomiting, or diarrhea. 01-02-2023 upon evaluation today patient appears to be doing much better in regard to his wounds the right foot appears to be almost healed the left foot though not completely healed is significantly better. Fortunately there does not appear to be any signs of active infection at this point. 01-10-2023  upon evaluation today patient appears to be doing well currently in regard to his wounds. The right heel actually is completely healed the left heel is definitely headed in the right direction and looks to be doing awesome. I do not see any signs of active infection locally nor systemically at this time which is great news. 3/7; his right heel has remained healed. There is no evidence of active infection. The remaining wound is on the lateral aspect of the heel just above the plantar surface 01-30-2023 upon evaluation today patient appears to be doing well currently in regard to his wound. The heel actually showing signs of improvement which is great news and fortunately I do not see any evidence of  active infection locally nor systemically at this time. Overall I think that we are headed in the right direction which is excellent here. No fevers, chills, nausea, vomiting, or diarrhea. I am good have to perform some sharp debridement today. This is just the left heel that is remaining open. 02-06-2023 upon evaluation today patient appears to be doing well currently in regard to his wound. Has been tolerating the dressing changes without complication. Fortunately there does not appear to be any signs of active infection locally nor systemically although he does have some need for sharp debridement with some definite necrotic tissue at the base of the wound today. 02-13-2023 upon evaluation today patient appears to be doing well currently in regard to his wound which I feel like is slowly clearing up. I did review his growth labs as well the sed rate and C-reactive protein are down his white blood cell count was normal his hemoglobin is coming up. He did see Dr. Joylene Draft on Tuesday and she did continue him on the doxycycline considering his lab numbers but nonetheless he seems to be making some pretty good progress here in my opinion. The wound is slowly turnaround serial debridements are obviously helping with that being said I do believe that he is going to still require dressing changes and close monitoring. Objective Constitutional Well-nourished and well-hydrated in no acute distress. Vitals Time Taken: 12:12 PM, Height: 69 in, Weight: 190 lbs, BMI: 28.1, Temperature: 97.4 F, Pulse: 90 bpm, Respiratory Rate: 18 breaths/min, Blood Pressure: 115/72 mmHg. Respiratory normal breathing without difficulty. Psychiatric this patient is able to make decisions and demonstrates good insight into disease process. Alert and Oriented x 3. pleasant and cooperative. LELAND, RAVER ([id number]) [id number] Page 7 of 8 General Notes: Upon inspection patient's wound bed  actually showed signs of good granulation epithelization at this point. Fortunately I do not see any signs of active infection at this time which is great news. With that being said I do think that an x-ray would not be a bad idea just to ensure that everything appears to be doing okay. Fortunately I do not see any signs of systemic infection either. Integumentary (Hair, Skin) Wound #5 status is Open. Original cause of wound was Pressure Injury. The date acquired was: 10/20/2022. The wound has been in treatment 11 weeks. The wound is located on the Left Calcaneus. The wound measures 1.8cm length x 1.1cm width x 0.6cm depth; 1.555cm^2 area and 0.933cm^3 volume. There is Fat Layer (Subcutaneous Tissue) exposed. There is no tunneling or undermining noted. There is a medium amount of serosanguineous drainage noted. There is small (1-33%) red, pink granulation within the wound bed. There is a large (67-100%) amount of necrotic tissue within the wound bed including Adherent Slough. Assessment Active Problems ICD-10  Type 1 diabetes mellitus with foot ulcer Other specified peripheral vascular diseases Pressure ulcer of left heel, stage 3 Pressure ulcer of right heel, stage 3 Non-pressure chronic ulcer of buttock with fat layer exposed Atherosclerotic heart disease of native coronary artery without angina pectoris Chronic combined systolic (congestive) and diastolic (congestive) heart failure Essential (primary) hypertension Procedures Wound #5 Pre-procedure diagnosis of Wound #5 is a Pressure Ulcer located on the Left Calcaneus . There was a Excisional Skin/Subcutaneous Tissue Debridement with a total area of 1.98 sq cm performed by Nelida Meuse., [name]. With the following instrument(s): Curette to remove Viable and Non-Viable tissue/material. Material removed includes Subcutaneous Tissue and Slough and after achieving pain control using Lidocaine 4% T opical Solution. No specimens were taken. A time  out was conducted at 12:43, prior to the start of the procedure. A Minimum amount of bleeding was controlled with Pressure. The procedure was tolerated well. Post Debridement Measurements: 1.8cm length x 1.1cm width x 0.6cm depth; 0.933cm^3 volume. Post debridement Stage noted as Category/Stage III. Character of Wound/Ulcer Post Debridement is stable. Post procedure Diagnosis Wound #5: Same as Pre-Procedure Plan Follow-up Appointments: Return Appointment in 1 week. Nurse Visit as needed Home Health: [facility] for wound care. May utilize formulary equivalent dressing for wound treatment orders unless otherwise specified. Home Health Nurse may visit PRN to address patientoos wound care needs. Frances Furbish fax [phone] [facility] Nurse. PLEASE change dressing on the left heel when you visit unless partner has already changed dressing. Apply Hydrofera blue (cut small piece to fit in wound bed, then cover with a square piece of hydrofera blue) and cover with a bordered foam dressing. Reapply compression stockings Bathing/ Shower/ Hygiene: May shower; gently cleanse wound with antibacterial soap, rinse and pat dry prior to dressing wounds No tub bath. - no soaking foot in tub Anesthetic (Use 'Patient Medications' Section for Anesthetic Order Entry): Lidocaine applied to wound bed Edema Control - Lymphedema / Segmental Compressive Device / Other: Elevate, Exercise Daily and Avoid Standing for Long Periods of Time. Elevate legs to the level of the heart and pump ankles as often as possible Elevate leg(s) parallel to the floor when sitting. DO YOUR BEST to sleep in the bed at night. DO NOT sleep in your recliner. Long hours of sitting in a recliner leads to swelling of the legs and/or potential wounds on your backside. Radiology ordered were: X-ray, foot - left foot/heel WOUND #5: - Calcaneus Wound Laterality: Left Cleanser: Soap and Water 1 x Per Day/30 Days Discharge Instructions: Gently  cleanse wound with antibacterial soap, rinse and pat dry prior to dressing wounds Prim Dressing: Hydrofera Blue Ready Transfer Foam, 2.5x2.5 (in/in) (Generic) 1 x Per Day/30 Days ary Discharge Instructions: Apply Hydrofera Blue Ready to wound bed as directed Please cut a small cirle to fit in wound bed and then place a square piece over top Secondary Dressing: (BORDER) Zetuvit Plus SILICONE BORDER Dressing 4x4 (in/in) (Generic) 1 x Per Day/30 Days Discharge Instructions: Please do not put silicone bordered dressings under wraps. Use non-bordered dressing only. 1. Based on what I am seeing I do believe that the patient is making good progress here. With that being said it slow this is a deeper wound and again it is not to take time to clear up and get moving in the right direction. In the interim obviously we are going to see slow progress to closure until we get this to fill-in. Nonetheless I do believe an x-ray would  be appropriate in this situation. 2. I am going to send the patient for an x-ray of the foot in order to evaluate and see where things stand at this point. SEDRICK, TOBER ([id number]) [id number] Page 8 of 8 We will see patient back for reevaluation in 1 week here in the clinic. If anything worsens or changes patient will contact our office for additional recommendations. Electronic Signature(s) Signed: 02/13/2023 6:15:09 PM By: Lenda Kelp [name] Entered By: Lenda Kelp on 02/13/2023 18:15:09 -------------------------------------------------------------------------------- SuperBill Details Patient Name: Date of Service: [name], [name] 02/13/2023 Medical Record Number: [id number] Patient Account Number: [id number] Date of Birth/Sex: Treating RN: July 18, 1946 (77 y.o. [name] Primary Care Provider: [name] Other Clinician: Referring Provider: Treating Provider/Extender: [name] in Treatment:  11 Diagnosis Coding ICD-10 Codes Code Description E10.621 Type 1 diabetes mellitus with foot ulcer I73.89 Other specified peripheral vascular diseases L89.623 Pressure ulcer of left heel, stage 3 L89.613 Pressure ulcer of right heel, stage 3 L98.412 Non-pressure chronic ulcer of buttock with fat layer exposed I25.10 Atherosclerotic heart disease of native coronary artery without angina pectoris I50.42 Chronic combined systolic (congestive) and diastolic (congestive) heart failure I10 Essential (primary) hypertension Facility Procedures : CPT4 Code: [id number] Description: 11042 - DEB SUBQ TISSUE 20 SQ CM/< ICD-10 Diagnosis Description L89.623 Pressure ulcer of left heel, stage 3 Modifier: Quantity: 1 Physician Procedures : CPT4 Code Description Modifier [id number] - WC PHYS LEVEL 3 - EST PT 25 ICD-10 Diagnosis Description E10.621 Type 1 diabetes mellitus with foot ulcer I73.89 Other specified peripheral vascular diseases L89.623 Pressure ulcer of left heel, stage 3  L89.613 Pressure ulcer of right heel, stage 3 Quantity: 1 : [id number] 11042 - WC PHYS SUBQ TISS 20 SQ CM ICD-10 Diagnosis Description L89.623 Pressure ulcer of left heel, stage 3 Quantity: 1 Electronic Signature(s) Signed: 02/13/2023 6:22:33 PM By: Lenda Kelp [name] Entered By: Lenda Kelp on 02/13/2023 18:22:32

## 2023-02-14 NOTE — Progress Notes (Signed)
[name] HeadlandORTERFIELD, Atzel H ([id number]) [id number] Page 1 of 7 Visit Report for 02/13/2023 Arrival Information Details Patient Name: Date of Service: [name] RTERFIELD, [name] 02/13/2023 12:00 PM Medical Record Number: [id number] Patient Account Number: [id number] Date of Birth/Sex: Treating RN: 04/01/1946 (77 y.o. [name]) Gordon, Caitlin Primary Care Cable Fearn: Jerl MinaHedrick, James Other Clinician: Referring Caeson Filippi: Treating Joshiah Traynham/Extender: Florian BuffStone, Hoyt Hedrick, James Weeks in Treatment: 11 Visit Information History Since Last Visit Added or deleted any medications: No Patient Arrived: Gilmer MorCane Any new allergies or adverse reactions: No Arrival Time: 12:09 Had a fall or experienced change in No Accompanied By: family activities of daily living that may affect Transfer Assistance: EasyPivot Patient Lift risk of falls: Patient Identification Verified: Yes Hospitalized since last visit: No Secondary Verification Process Completed: Yes Has Dressing in Place as Prescribed: Yes Patient Requires Transmission-Based Precautions: No Pain Present Now: No Patient Has Alerts: Yes Patient Alerts: Type I Diabetic eliquis ABI/TBI normal see scan Electronic Signature(s) Signed: 02/13/2023 4:55:39 PM By: Angelina PihGordon, Caitlin Entered By: Angelina PihGordon, Caitlin on 02/13/2023 12:13:47 -------------------------------------------------------------------------------- Clinic Level of Care Assessment Details Patient Name: Date of Service: [name] RTERFIELD, [name] 02/13/2023 12:00 PM Medical Record Number: [id number] Patient Account Number: [id number] Date of Birth/Sex: Treating RN: 08/27/1946 (77 y.o. [name]) Gordon, Caitlin Primary Care Pola Furno: Jerl MinaHedrick, James Other Clinician: Referring Mahagony Grieb: Treating Jamilia Jacques/Extender: Florian BuffStone, Hoyt Hedrick, James Weeks in Treatment: 11 Clinic Level of Care Assessment Items TOOL 1 Quantity Score []  - 0 Use when EandM and Procedure is performed on INITIAL visit ASSESSMENTS  - Nursing Assessment / Reassessment []  - 0 General Physical Exam (combine w/ comprehensive assessment (listed just below) when performed on new pt. evals) []  - 0 Comprehensive Assessment (HX, ROS, Risk Assessments, Wounds Hx, etc.) ASSESSMENTS - Wound and Skin Assessment / Reassessment []  - 0 Dermatologic / Skin Assessment (not related to wound area) ASSESSMENTS - Ostomy and/or Continence Assessment and Care []  - 0 Incontinence Assessment and Management []  - 0 Ostomy Care Assessment and Management (repouching, etc.) PROCESS - Coordination of Care []  - 0 Simple Patient / Family Education for ongoing care []  - 0 Complex (extensive) Patient / Family Education for ongoing care []  - 0 Staff obtains ChiropractorConsents, Records, T Results / Process Orders est []  - 0 Staff telephones HHA, Nursing Homes / Clarify orders / etc []  - 0 Routine Transfer to another Facility (non-emergent condition) []  - 0 Routine Hospital Admission (non-emergent condition) []  - 0 New Admissions / Insurance Authorizations / Ordering NPWT Apligraf, etc. , []  - 0 Emergency Hospital Admission (emergent condition) Giroux, Raistlin H ([id number]) [id number] Page 2 of 7 PROCESS - Special Needs []  - 0 Pediatric / Minor Patient Management []  - 0 Isolation Patient Management []  - 0 Hearing / Language / Visual special needs []  - 0 Assessment of Community assistance (transportation, D/C planning, etc.) []  - 0 Additional assistance / Altered mentation []  - 0 Support Surface(s) Assessment (bed, cushion, seat, etc.) INTERVENTIONS - Miscellaneous []  - 0 External ear exam []  - 0 Patient Transfer (multiple staff / Nurse, adultHoyer Lift / Similar devices) []  - 0 Simple Staple / Suture removal (25 or less) []  - 0 Complex Staple / Suture removal (26 or more) []  - 0 Hypo/Hyperglycemic Management (do not check if billed separately) []  - 0 Ankle / Brachial Index (ABI) - do not check if billed  separately Has the patient been seen at the hospital within the last three years: Yes Total Score: 0 Level Of Care: ____ Electronic Signature(s) Signed: 02/13/2023 4:55:39 PM  By: Angelina PihGordon, Caitlin Entered By: Angelina PihGordon, Caitlin on 02/13/2023 13:39:00 -------------------------------------------------------------------------------- Encounter Discharge Information Details Patient Name: Date of Service: [name] RTERFIELD, [name] 02/13/2023 12:00 PM Medical Record Number: [id number] Patient Account Number: [id number] Date of Birth/Sex: Treating RN: 07/28/1946 (77 y.o. [name]) Gordon, Caitlin Primary Care Inetha Maret: Jerl MinaHedrick, James Other Clinician: Referring Borna Wessinger: Treating Raymona Boss/Extender: Florian BuffStone, Hoyt Hedrick, James Weeks in Treatment: 11 Encounter Discharge Information Items Post Procedure Vitals Discharge Condition: Stable Temperature (F): 97.4 Ambulatory Status: Cane Pulse (bpm): 90 Discharge Destination: Home Respiratory Rate (breaths/min): 18 Transportation: Private Auto Blood Pressure (mmHg): 115/72 Accompanied By: significant other Schedule Follow-up Appointment: Yes Clinical Summary of Care: Electronic Signature(s) Signed: 02/13/2023 1:45:38 PM By: Angelina PihGordon, Caitlin Entered By: Angelina PihGordon, Caitlin on 02/13/2023 13:45:38 -------------------------------------------------------------------------------- Lower Extremity Assessment Details Patient Name: Date of Service: [name] RTERFIELD, [name] 02/13/2023 12:00 PM Medical Record Number: [id number] Patient Account Number: [id number] Date of Birth/Sex: Treating RN: 11/11/1945 (77 y.o. [name]) Gordon, Caitlin Primary Care Parys Elenbaas: Jerl MinaHedrick, James Other Clinician: Referring Tiegan Terpstra: Treating Avionna Bower/Extender: Florian BuffStone, Hoyt Hedrick, James Weeks in Treatment: 11 Edema Assessment Assessed: [name] Searles[Left: No] [name][Right: No] [Left: Edema] [Right: :] P[Left: ORTERFIELD, Juleen ChinaLAUDE H ([id number])]([id number])] [Right: [id number] Page 3 of 7] Calf Left:  Right: Point of Measurement: 34 cm From Medial Instep 39 cm Ankle Left: Right: Point of Measurement: 10 cm From Medial Instep 26.5 cm Vascular Assessment Pulses: Dorsalis Pedis Palpable: [Left:Yes] Posterior Tibial Palpable: [Left:Yes] Electronic Signature(s) Signed: 02/13/2023 4:55:39 PM By: Angelina PihGordon, Caitlin Entered By: Angelina PihGordon, Caitlin on 02/13/2023 12:22:52 -------------------------------------------------------------------------------- Multi Wound Chart Details Patient Name: Date of Service: [name] RTERFIELD, [name] 02/13/2023 12:00 PM Medical Record Number: [id number] Patient Account Number: [id number] Date of Birth/Sex: Treating RN: 11/14/1945 (77 y.o. [name]) Gordon, Caitlin Primary Care Katesha Eichel: Jerl MinaHedrick, James Other Clinician: Referring Yordin Rhoda: Treating Azaria Bartell/Extender: Florian BuffStone, Hoyt Hedrick, James Weeks in Treatment: 11 Vital Signs Height(in): 69 Pulse(bpm): 90 Weight(lbs): 190 Blood Pressure(mmHg): 115/72 Body Mass Index(BMI): 28.1 Temperature(F): 97.4 Respiratory Rate(breaths/min): 18 [5:Photos:] [N/A:N/A] Left Calcaneus N/A N/A Wound Location: Pressure Injury N/A N/A Wounding Event: Pressure Ulcer N/A N/A Primary Etiology: Coronary Artery Disease, N/A N/A Comorbid History: Hypertension, Type I Diabetes, Osteoarthritis, Received Chemotherapy 10/20/2022 N/A N/A Date Acquired: 11 N/A N/A Weeks of Treatment: Open N/A N/A Wound Status: No N/A N/A Wound Recurrence: 1.8x1.1x0.6 N/A N/A Measurements L x W x D (cm) 1.555 N/A N/A A (cm) : rea 0.933 N/A N/A Volume (cm) : 67.00% N/A N/A % Reduction in A rea: -98.10% N/A N/A % Reduction in Volume: Category/Stage III N/A N/A Classification: Medium N/A N/A Exudate A mount: Serosanguineous N/A N/A Exudate Type: red, brown N/A N/A Exudate Color: Small (1-33%) N/A N/A Granulation A mount: Red, Pink N/A N/A Granulation Quality: Large (67-100%) N/A N/A Necrotic A mount: Fat Layer (Subcutaneous Tissue):  Yes N/A N/A Exposed Structures: Fascia: No Wuellner, Babe H ([id number]) [id number] Page 4 of 7 Tendon: No Muscle: No Joint: No Bone: No Small (1-33%) N/A N/A Epithelialization: Treatment Notes Electronic Signature(s) Signed: 02/13/2023 4:55:39 PM By: Angelina PihGordon, Caitlin Entered By: Angelina PihGordon, Caitlin on 02/13/2023 12:42:27 -------------------------------------------------------------------------------- Multi-Disciplinary Care Plan Details Patient Name: Date of Service: [name] RTERFIELD, [name] 02/13/2023 12:00 PM Medical Record Number: [id number] Patient Account Number: [id number] Date of Birth/Sex: Treating RN: 04/22/1946 (77 y.o. [name]) Gordon, Caitlin Primary Care Anuel Sitter: Jerl MinaHedrick, James Other Clinician: Referring Erma Raiche: Treating Kadeisha Betsch/Extender: Florian BuffStone, Hoyt Hedrick, James Weeks in Treatment: 11 Active Inactive Pressure Nursing Diagnoses: Potential for impaired tissue integrity related to pressure, friction, moisture, and shear Goals: Patient will remain free  of pressure ulcers Date Initiated: 11/26/2022 Target Resolution Date: 03/11/2023 Goal Status: Active Interventions: Assess: immobility, friction, shearing, incontinence upon admission and as needed Assess offloading mechanisms upon admission and as needed Assess potential for pressure ulcer upon admission and as needed Notes: Wound/Skin Impairment Nursing Diagnoses: Knowledge deficit related to ulceration/compromised skin integrity Goals: Patient/caregiver will verbalize understanding of skin care regimen Date Initiated: 11/26/2022 Date Inactivated: 01/16/2023 Target Resolution Date: 12/03/2022 Goal Status: Met Ulcer/skin breakdown will have a volume reduction of 30% by week 4 Date Initiated: 11/26/2022 Date Inactivated: 02/13/2023 Target Resolution Date: 12/24/2022 Goal Status: Met Ulcer/skin breakdown will have a volume reduction of 50% by week 8 Date Initiated: 11/26/2022 Date Inactivated:  02/13/2023 Target Resolution Date: 01/21/2023 Goal Status: Unmet Unmet Reason: underlying conditions Ulcer/skin breakdown will have a volume reduction of 80% by week 12 Date Initiated: 11/26/2022 Target Resolution Date: 02/18/2023 Goal Status: Active Ulcer/skin breakdown will heal within 14 weeks Date Initiated: 11/26/2022 Target Resolution Date: 03/18/2023 Goal Status: Active Interventions: Assess patient/caregiver ability to obtain necessary supplies Assess patient/caregiver ability to perform ulcer/skin care regimen upon admission and as needed Assess ulceration(s) every visit Notes: week 4 30% healing met for right calcaneus but not met for left calcaneus. ERVINE, WITUCKI ([id number]) [id number] Page 5 of 7 Electronic Signature(s) Signed: 02/13/2023 1:43:34 PM By: Angelina Pih Previous Signature: 02/13/2023 1:42:49 PM Version By: Angelina Pih Entered By: Angelina Pih on 02/13/2023 13:43:34 -------------------------------------------------------------------------------- Pain Assessment Details Patient Name: Date of Service: [name] RTERFIELD, [name] 02/13/2023 12:00 PM Medical Record Number: [id number] Patient Account Number: [id number] Date of Birth/Sex: Treating RN: August 29, 1946 (77 y.o. [name] Purser Primary Care Abbott Jasinski: [name] Other Clinician: Referring Emiel Kielty: Treating Penny Frisbie/Extender: [name] in Treatment: 11 Active Problems Location of Pain Severity and Description of Pain Patient Has Paino No Site Locations Rate the pain. Current Pain Level: 0 Pain Management and Medication Current Pain Management: Electronic Signature(s) Signed: 02/13/2023 4:55:39 PM By: Angelina Pih Entered By: Angelina Pih on 02/13/2023 12:14:20 -------------------------------------------------------------------------------- Patient/Caregiver Education Details Patient Name: Date of Service: [name] RTERFIELD, CLA Reine Just  4/4/2024andnbsp12:00 PM Medical Record Number: [id number] Patient Account Number: [id number] Date of Birth/Gender: Treating RN: [birth year] (77 y.o. [name] Purser Primary Care Physician: [name] Other Clinician: Referring Physician: Treating Physician/Extender: [name] in Treatment: 11 Education Assessment Education Provided To: Patient and Caregiver Education Topics Provided Wound/Skin Impairment: Handouts: Caring for Your Ulcer, Other: xray foot/heel ordered Methods: Explain/Verbal Responses: State content correctly JACKSYN, BEEKS ([id number]) [phone].pdf Page 6 of 7 Electronic Signature(s) Signed: 02/13/2023 4:55:39 PM By: Angelina Pih Entered By: Angelina Pih on 02/13/2023 13:44:04 -------------------------------------------------------------------------------- Wound Assessment Details Patient Name: Date of Service: [name] RTERFIELD, [name] 02/13/2023 12:00 PM Medical Record Number: [id number] Patient Account Number: [id number] Date of Birth/Sex: Treating RN: 1946/07/26 (77 y.o. [name] Purser Primary Care Emerald Shor: [name] Other Clinician: Referring Raj Landress: Treating Yoandri Congrove/Extender: [name] in Treatment: 11 Wound Status Wound Number: 5 Primary Pressure Ulcer Etiology: Wound Location: Left Calcaneus Wound Open Wounding Event: Pressure Injury Status: Date Acquired: 10/20/2022 Comorbid Coronary Artery Disease, Hypertension, Type I Diabetes, Weeks Of Treatment: 11 History: Osteoarthritis, Received Chemotherapy Clustered Wound: No Photos Wound Measurements Length: (cm) 1.8 Width: (cm) 1.1 Depth: (cm) 0.6 Area: (cm) 1.555 Volume: (cm) 0.933 % Reduction in Area: 67% % Reduction in Volume: -98.1% Epithelialization: Small (1-33%) Tunneling: No Undermining: No Wound Description Classification: Category/Stage III Exudate Amount: Medium Exudate Type:  Serosanguineous Exudate Color: red,  brown Foul Odor After Cleansing: No Slough/Fibrino Yes Wound Bed Granulation Amount: Small (1-33%) Exposed Structure Granulation Quality: Red, Pink Fascia Exposed: No Necrotic Amount: Large (67-100%) Fat Layer (Subcutaneous Tissue) Exposed: Yes Necrotic Quality: Adherent Slough Tendon Exposed: No Muscle Exposed: No Joint Exposed: No Bone Exposed: No Treatment Notes Wound #5 (Calcaneus) Wound Laterality: Left Cleanser Soap and Water Discharge Instruction: Gently cleanse wound with antibacterial soap, rinse and pat dry prior to dressing wounds Peri-Wound Care Topical Primary Dressing Hydrofera Blue Ready Transfer Foam, 2.5x2.5 (in/in) Senseney, Cormick H ([id number]) [phone].pdf Page 7 of 7 Discharge Instruction: Apply Hydrofera Blue Ready to wound bed as directed Please cut a small cirle to fit in wound bed and then place a square piece over top Secondary Dressing (BORDER) Zetuvit Plus SILICONE BORDER Dressing 4x4 (in/in) Discharge Instruction: Please do not put silicone bordered dressings under wraps. Use non-bordered dressing only. Secured With Compression Wrap Compression Stockings Add-Ons Electronic Signature(s) Signed: 02/13/2023 12:26:14 PM By: Angelina Pih Entered By: Angelina Pih on 02/13/2023 12:26:13 -------------------------------------------------------------------------------- Vitals Details Patient Name: Date of Service: [name] RTERFIELD, [name] 02/13/2023 12:00 PM Medical Record Number: [id number] Patient Account Number: [id number] Date of Birth/Sex: Treating RN: [birth year] (77 y.o. [name] Purser Primary Care Aly Seidenberg: [name] Other Clinician: Referring Sawyer Kahan: Treating Breahna Boylen/Extender: [name] in Treatment: 11 Vital Signs Time Taken: 12:12 Temperature (F): 97.4 Height (in): 69 Pulse (bpm): 90 Weight (lbs): 190 Respiratory Rate (breaths/min):  18 Body Mass Index (BMI): 28.1 Blood Pressure (mmHg): 115/72 Reference Range: 80 - 120 mg / dl Electronic Signature(s) Signed: 02/13/2023 4:55:39 PM By: Angelina Pih Entered By: Angelina Pih on 02/13/2023 12:14:07

## 2023-02-17 DIAGNOSIS — E109 Type 1 diabetes mellitus without complications: Secondary | ICD-10-CM | POA: Diagnosis not present

## 2023-02-18 NOTE — Telephone Encounter (Signed)
Left a message for the patient to call back.  

## 2023-02-18 NOTE — Telephone Encounter (Signed)
Pt calling back

## 2023-02-18 NOTE — Telephone Encounter (Signed)
Left message to call back  

## 2023-02-19 ENCOUNTER — Other Ambulatory Visit (INDEPENDENT_AMBULATORY_CARE_PROVIDER_SITE_OTHER): Payer: Self-pay | Admitting: Nurse Practitioner

## 2023-02-19 DIAGNOSIS — R062 Wheezing: Secondary | ICD-10-CM | POA: Diagnosis not present

## 2023-02-19 DIAGNOSIS — I808 Phlebitis and thrombophlebitis of other sites: Secondary | ICD-10-CM

## 2023-02-19 DIAGNOSIS — R058 Other specified cough: Secondary | ICD-10-CM | POA: Diagnosis not present

## 2023-02-19 NOTE — Telephone Encounter (Signed)
Message Received: Today Gollan, Tollie Pizza, MD  P Cv Div Burl Triage Caller: Unspecified (1 week ago)  would recommend he monitor blood pressure closely 2 hours after taking midodrine If it runs high, we may need to decrease midodrine down to 5 mg Will try not to take midodrine to close to his bedtime as he does not want to lay supine in bed with midodrine still in his system, blood pressure will run high Would always try to stay sitting or upright when midodrine is in his system Thx Tgollan   Left the pt a message to call the office back to endorse recommendations as mentioned above, per Dr. Mariah Milling.

## 2023-02-19 NOTE — Telephone Encounter (Signed)
Patient is returning call and is requesting return call.  

## 2023-02-19 NOTE — Telephone Encounter (Signed)
Returned call to patient. He reports he no longer has issues with dizziness, and other than having a cold right now he feels much better than when he first called in.  Patient states he is taking midodrine twice daily (once in the morning and once in the evening).   He does not have any BP readings to report, he states yesterday he had a high BP reading around 170/60, but otherwise has been WNL.  He states he will call if any symptoms return, or if new symptoms come up.  Patient expressed appreciation for call.

## 2023-02-19 NOTE — Telephone Encounter (Signed)
Pt made aware of Dr. Ethelene Hal medication instructions for midodrine, and as indicated in this message.  He will closely monitor his pressures 2 hours after administration of midodrine, and keep [place] posted as needed if he notices his numbers running high, for we may need to decrease his midodrine to the 5 mg tablet at that time.  He is aware to try and not take this medication so close to bedtime, as he does not want to lay supine in bed with midodrine still in his system, for this can cause his pressures to run high.   Advised him to always try to stay sitting or upright when midodrine is in his system.  Pt verbalized understanding and agrees with this plan.  He will keep Dr. Mariah Milling posted as needed.

## 2023-02-20 ENCOUNTER — Encounter: Payer: Medicare HMO | Admitting: Physician Assistant

## 2023-02-20 DIAGNOSIS — I5042 Chronic combined systolic (congestive) and diastolic (congestive) heart failure: Secondary | ICD-10-CM | POA: Diagnosis not present

## 2023-02-20 DIAGNOSIS — I110 Hypertensive heart disease with heart failure: Secondary | ICD-10-CM | POA: Diagnosis not present

## 2023-02-20 DIAGNOSIS — E10622 Type 1 diabetes mellitus with other skin ulcer: Secondary | ICD-10-CM | POA: Diagnosis not present

## 2023-02-20 DIAGNOSIS — E1051 Type 1 diabetes mellitus with diabetic peripheral angiopathy without gangrene: Secondary | ICD-10-CM | POA: Diagnosis not present

## 2023-02-20 DIAGNOSIS — L98412 Non-pressure chronic ulcer of buttock with fat layer exposed: Secondary | ICD-10-CM | POA: Diagnosis not present

## 2023-02-20 DIAGNOSIS — L89613 Pressure ulcer of right heel, stage 3: Secondary | ICD-10-CM | POA: Diagnosis not present

## 2023-02-20 DIAGNOSIS — E10621 Type 1 diabetes mellitus with foot ulcer: Secondary | ICD-10-CM | POA: Diagnosis not present

## 2023-02-20 DIAGNOSIS — E785 Hyperlipidemia, unspecified: Secondary | ICD-10-CM | POA: Diagnosis not present

## 2023-02-20 DIAGNOSIS — L89623 Pressure ulcer of left heel, stage 3: Secondary | ICD-10-CM | POA: Diagnosis not present

## 2023-02-20 NOTE — Progress Notes (Addendum)
MIKEN, STECHER ([id number]) [id number] Page 1 of 8 Visit Report for 02/20/2023 Chief Complaint Document Details Patient Name: Date of Service: [name], [name] 02/20/2023 12:00 PM Medical Record Number: [id number] Patient Account Number: [id number] Date of Birth/Sex: Treating RN: [birth year] (77 y.o. [name] Primary Care Provider: [name] Other Clinician: Referring Provider: Treating Provider/Extender: [name] in Treatment: 12 Information Obtained from: Patient Chief Complaint Left heel pressure ulcer Electronic Signature(s) Signed: 02/20/2023 12:34:49 PM By: [name] [name] Entered By: [name] on 02/20/2023 12:34:49 -------------------------------------------------------------------------------- Debridement Details Patient Name: Date of Service: [name], [name] 02/20/2023 12:00 PM Medical Record Number: [id number] Patient Account Number: [id number] Date of Birth/Sex: Treating RN: 07-10-1946 (77 y.o. [name] Primary Care Provider: [name] Other Clinician: Referring Provider: Treating Provider/Extender: [name] in Treatment: 12 Debridement Performed for Assessment: Wound #5 Left Calcaneus Performed By: Physician [name], [name] Debridement Type: Debridement Level of Consciousness (Pre-procedure): Awake and Alert Pre-procedure Verification/Time Out Yes - 12:39 Taken: Pain Control: Lidocaine 4% T opical Solution T Area Debrided (L x W): otal 1.8 (cm) x 1.2 (cm) = 2.16 (cm) Tissue and other material debrided: Viable, Non-Viable, Slough, Subcutaneous, Slough Level: Skin/Subcutaneous Tissue Debridement Description: Excisional Instrument: Curette Bleeding: Moderate Hemostasis Achieved: Pressure Response to Treatment: Procedure was tolerated well Level of Consciousness (Post- Awake and Alert procedure): Post Debridement Measurements of Total  Wound Length: (cm) 1.8 Stage: Category/Stage III Width: (cm) 1.2 Depth: (cm) 0.9 Volume: (cm) 1.527 Character of Wound/Ulcer Post Debridement: Stable Post Procedure Diagnosis Same as Pre-procedure Electronic Signature(s) Signed: 02/20/2023 5:07:08 PM By: Angelina Pih Signed: 02/20/2023 10:26:23 PM By: [name] [name] Entered By: Angelina Pih on 02/20/2023 12:43:12 HPI Details -------------------------------------------------------------------------------- Minette Headland ([id number]) [id number] Page 2 of 8 Patient Name: Date of Service: [name], [name] 02/20/2023 12:00 PM Medical Record Number: [id number] Patient Account Number: [id number] Date of Birth/Sex: Treating RN: 07-May-1946 (77 y.o. [name] Primary Care Provider: [name] Other Clinician: Referring Provider: Treating Provider/Extender: [name] in Treatment: 12 History of Present Illness HPI Description: 77 year old male recently seen by his PCP Dr. [name] who saw him for a wound on the right leg which is less tender and he is continuing to use bacitracin and has completed her course of doxycycline. Past medical history of coronary artery disease, diabetes mellitus type 1, hyperlipidemia, hypertension, MI, plantar fascial fibromatosis, pneumonia, status post foot surgery due to trauma on the left side, coronary angioplasty. he is a former smoker and quit in July 1996. last hemoglobin A1c was 7.8 % in April of this year 5/22 2017 -- biopsy of the right lower extremity was sent for pathology and the report is that of a basal cell carcinoma with edges of the biopsy are involved. I have called the patient over the phone( 03/28/16) and discussed the pathology report with him and he is agreeable to see a surgeon for excision and need full. I have also spoken personally to [name], of [facility] surgical and referred the patient for further  wide excision of this lesion and have communicated this to the patient. READMISSION 08/12/18 This is a now 77 year old man with type 1 diabetes. Hemoglobin A1c on 4/29 was 7.9. He is listed as having a history of PAD in the [facility] records although the patient doesn't recall this, doesn't complain of any symptoms of claudication and doesn't believe he has had any prior noninvasive  arterial test. He is a nonsmoker. He was here in 2017 with a wound on his Right lower leg. This was biopsied in the clinic and pathology showed a basal cell carcinoma. He went on to have surgery on this area this is currently where I believe his current wounds are located. He generally bumped the lateral part of his right calf 2 weeks ago and has a superficial abrasion. He also has 2 small open areas on the medial part of the tibia in the same area. Our intake nurse noted a stitch coming out of this which was removed. These of the wound sees actually come to the clinic for. However the more concerning area is an area on the tip of the left great toe. He says that this was initially traumatic and he's been to see [name]. [name] been using what I think is Santyl to the wound tip. This has some depth. ABIs in this clinic were non-obtainable on the right and 1.84 on the left. 08/19/18; the patient had arterial studies that did not show evidence of significant hemodynamically compromising occlusions in either leg. There was mild medial atherosclerosis bilaterally. His resting ABIs were within the normal limits at 1.3 on the right and 1.4 on the left. There was normal posterior and anterior tibial artery waveforms and velocities listed on both sides. The patient arrives with what appears to be a healthy surface over the tip of his left great toe. This is indeed surprising. Still looks somewhat friable however. More concerning is the x-ray that we did that showed erosions of the tuft of the distal phalanx the  left great toe consistent with osteomyelitis. I attempted to pull this x-ray up on colon health Link however I can't open the film to look at this myself. 08/26/18; I reviewed the patient's x-ray and colon helpline. Indeed there is fairly obvious erosion of the tip of his left great toe. The wound was initially a traumatic area hitting it sometime in the middle of night in his kitchen. He is a type I diabetic. I have him on Flagyl and Levaquin that I started last week i.e. 7 days ago. He has an appointment with infectious disease on 09/01/18 09/02/18; the patient was seen by [name] of infectious disease. She did not feel the patient needed IV antibiotics. I do feel he needs further oral antibiotics however. He does not need anymore Flagyl but he does need another 2 weeks of Levaquin. The areas just about closed. 09/16/18; the patient is completing his Levaquin today. There is no open wound on the tip of the toe Readmission: 11-26-2022 upon evaluation today patient presents for initial inspection here in our clinic concerning issues that he has been having after having been hospitalized due to septic arthritis. Subsequently he has a PICC line that is been removed and he has been discharged from infectious disease in that regard. Unfortunately he developed shearing wounds over the gluteal region as well as a pressure ulcer over each heel although they are not looking extremely bad nonetheless they do seem to be evidence of pressure which she sustained while hospitalized. Obviously he was very sick and is still recovering as far as that is concerned. Fortunately there does not appear to be any evidence of active infection systemically which is good news at this point according to Dr. [name]. Patient's wounds also do not appear to be too bad at this point which is good news. Patient does have a history of type 1  diabetes mellitus, peripheral vascular disease, coronary artery disease, congestive  heart failure, hypertension, and he is on long-term anticoagulant therapy. 12-03-2022 upon evaluation today patient appears to be doing better to some degree in regard to his ulcers on his heels at this point. We are still waiting on the actual appointment with the vascular surgeons but nonetheless in the meantime I do believe that he is making some good progress currently with regard to loosen up the eschar. I did actually feel like that there was some ability for us to remove some of the necrotic tissue today and I discussed that with the patient. He is in agreement with that plan. We also can probably see about making an adjustment in the treatment regimen. 12-10-2022 upon evaluation today patient's wounds were really doing about the same. Again he is still awaiting the evaluation with the vascular surgeon. That should have already been done but had to be pushed back unfortunately. He will be seeing them next week on the seventh. I plan to see him following somewhere around the end of the week I think will probably be best. The patient voiced understanding. 12-19-2022 upon evaluation today patient appears to be doing well currently in regard to his wound we did get the results of his arterial studies which show that he has excellent ABIs with no signs of vascular compromise. For that reason I will go ahead and perform some debridement today to clearway the necrotic debris with his Eliquis that I will be too aggressive so this will still be a stepwise process but we are to go ahead and get that started. 12-27-2022 upon evaluation today patient appears to be doing well currently in regard to his wounds. They are actually showing signs of excellent improvement. There is some necrotic tissue noted on the surface of wounds to work on clearing some of that away today also I think that we may [facility] to see how things do going forward. He is in agreement with that plan. Subsequently I  am extremely pleased with where we stand currently. No fevers, chills, nausea, vomiting, or diarrhea. 01-02-2023 upon evaluation today patient appears to be doing much better in regard to his wounds the right foot appears to be almost healed the left foot though not completely healed is significantly better. Fortunately there does not appear to be any signs of active infection at this point. 01-10-2023 upon evaluation today patient appears to be doing well currently in regard to his wounds. The right heel actually is completely healed the left heel is definitely headed in the right direction and looks to be doing awesome. I do not see any signs of active infection locally nor systemically at this time which is great news. 3/7; his right heel has remained healed. There is no evidence of active infection. The remaining wound is on the lateral aspect of the heel just above the plantar surface Szumski, Dontai H ([id number]) [id number] Page 3 of 8 01-30-2023 upon evaluation today patient appears to be doing well currently in regard to his wound. The heel actually showing signs of improvement which is great news and fortunately I do not see any evidence of active infection locally nor systemically at this time. Overall I think that we are headed in the right direction which is excellent here. No fevers, chills, nausea, vomiting, or diarrhea. I am good have to perform some sharp debridement today. This is just the left heel that is remaining open. 02-06-2023 upon evaluation today  patient appears to be doing well currently in regard to his wound. Has been tolerating the dressing changes without complication. Fortunately there does not appear to be any signs of active infection locally nor systemically although he does have some need for sharp debridement with some definite necrotic tissue at the base of the wound today. 02-13-2023 upon evaluation today patient appears to be doing  well currently in regard to his wound which I feel like is slowly clearing up. I did review his growth labs as well the sed rate and C-reactive protein are down his white blood cell count was normal his hemoglobin is coming up. He did see Dr. Joylene Draft on Tuesday and she did continue him on the doxycycline considering his lab numbers but nonetheless he seems to be making some pretty good progress here in my opinion. The wound is slowly turnaround serial debridements are obviously helping with that being said I do believe that he is going to still require dressing changes and close monitoring. 02-20-2023 upon evaluation today patient's wound actually appears to be making some slight progress. This is slowly but surely moving into the right direction. Fortunately I do not see any signs of active infection at this time. Electronic Signature(s) Signed: 02/20/2023 4:58:30 PM By: [name] [name] Entered By: [name] on 02/20/2023 16:58:29 -------------------------------------------------------------------------------- Physical Exam Details Patient Name: Date of Service: [name], [name] 02/20/2023 12:00 PM Medical Record Number: [id number] Patient Account Number: [id number] Date of Birth/Sex: Treating RN: 08/26/1946 (77 y.o. [name] Primary Care Provider: [name] Other Clinician: Referring Provider: Treating Provider/Extender: [name] in Treatment: 12 Constitutional Well-nourished and well-hydrated in no acute distress. Respiratory normal breathing without difficulty. Psychiatric this patient is able to make decisions and demonstrates good insight into disease process. Alert and Oriented x 3. pleasant and cooperative. Notes Upon inspection patient's wound is showing signs of improvement his x-ray was negative for any signs of osteomyelitis which is good I do not think he is going require an MRI yet but this is something that we will keep in  mind for future. Electronic Signature(s) Signed: 02/20/2023 4:58:55 PM By: [name] [name] Entered By: [name] on 02/20/2023 16:58:55 -------------------------------------------------------------------------------- Physician Orders Details Patient Name: Date of Service: [name], [name] 02/20/2023 12:00 PM Medical Record Number: [id number] Patient Account Number: [id number] Date of Birth/Sex: Treating RN: 10-15-1946 (77 y.o. [name] Primary Care Provider: [name] Other Clinician: Referring Provider: Treating Provider/Extender: [name] in Treatment: 12 Verbal / Phone Orders: No Diagnosis Coding ICD-10 Coding Code Description E10.621 Type 1 diabetes mellitus with foot ulcer I73.89 Other specified peripheral vascular diseases L89.623 Pressure ulcer of left heel, stage 3 L89.613 Pressure ulcer of right heel, stage 3 L98.412 Non-pressure chronic ulcer of buttock with fat layer exposed I25.10 Atherosclerotic heart disease of native coronary artery without angina pectoris Pech, Uzoma H ([id number]) [id number] Page 4 of 8 I50.42 Chronic combined systolic (congestive) and diastolic (congestive) heart failure I10 Essential (primary) hypertension Follow-up Appointments Return Appointment in 1 week. Nurse Visit as needed Home Health DISCONTINUE Home Health for Wound Care. - Patient no longer needs home health wound care Bathing/ Shower/ Hygiene May shower; gently cleanse wound with antibacterial soap, rinse and pat dry prior to dressing wounds No tub bath. - no soaking foot in tub Anesthetic (Use 'Patient Medications' Section for Anesthetic Order Entry) Lidocaine applied to wound bed Edema Control - Lymphedema / Segmental Compressive Device / Other  Elevate, Exercise Daily and A void Standing for Long Periods of Time. Elevate legs to the level of the heart and pump ankles as often as possible Elevate  leg(s) parallel to the floor when sitting. DO YOUR BEST to sleep in the bed at night. DO NOT sleep in your recliner. Long hours of sitting in a recliner leads to swelling of the legs and/or potential wounds on your backside. Wound Treatment Wound #5 - Calcaneus Wound Laterality: Left Cleanser: Soap and Water 1 x Per Day/30 Days Discharge Instructions: Gently cleanse wound with antibacterial soap, rinse and pat dry prior to dressing wounds Prim Dressing: Hydrofera Blue Ready Transfer Foam, 2.5x2.5 (in/in) (Generic) 1 x Per Day/30 Days ary Discharge Instructions: Apply Hydrofera Blue Ready to wound bed as directed Please cut a small cirle to fit in wound bed and then place a square piece over top Secondary Dressing: (BORDER) Zetuvit Plus SILICONE BORDER Dressing 4x4 (in/in) (Generic) 1 x Per Day/30 Days Discharge Instructions: Please do not put silicone bordered dressings under wraps. Use non-bordered dressing only. Electronic Signature(s) Signed: 02/20/2023 5:07:08 PM By: Angelina Pih Signed: 02/20/2023 10:26:23 PM By: [name] [name] Entered By: Angelina Pih on 02/20/2023 16:23:52 -------------------------------------------------------------------------------- Problem List Details Patient Name: Date of Service: [name], [name] 02/20/2023 12:00 PM Medical Record Number: [id number] Patient Account Number: [id number] Date of Birth/Sex: Treating RN: 10-03-1946 (77 y.o. [name] Primary Care Provider: [name] Other Clinician: Referring Provider: Treating Provider/Extender: [name] in Treatment: 12 Active Problems ICD-10 Encounter Code Description Active Date MDM Diagnosis E10.621 Type 1 diabetes mellitus with foot ulcer 11/26/2022 No Yes I73.89 Other specified peripheral vascular diseases 11/26/2022 No Yes L89.623 Pressure ulcer of left heel, stage 3 11/26/2022 No Yes L89.613 Pressure ulcer of right heel, stage 3 11/26/2022 No  Yes Riede, Kalai H ([id number]) [id number] Page 5 of 8 [phone] Non-pressure chronic ulcer of buttock with fat layer exposed 11/26/2022 No Yes I25.10 Atherosclerotic heart disease of native coronary artery without angina pectoris 11/26/2022 No Yes I50.42 Chronic combined systolic (congestive) and diastolic (congestive) heart failure 11/26/2022 No Yes I10 Essential (primary) hypertension 11/26/2022 No Yes Inactive Problems Resolved Problems Electronic Signature(s) Signed: 02/20/2023 12:34:41 PM By: [name] [name] Entered By: [name] on 02/20/2023 12:34:41 -------------------------------------------------------------------------------- Progress Note Details Patient Name: Date of Service: [name], [name] 02/20/2023 12:00 PM Medical Record Number: [id number] Patient Account Number: [id number] Date of Birth/Sex: Treating RN: [birth year] (77 y.o. [name] Primary Care Provider: [name] Other Clinician: Referring Provider: Treating Provider/Extender: [name] in Treatment: 12 Subjective Chief Complaint Information obtained from Patient Left heel pressure ulcer History of Present Illness (HPI) 77 year old male recently seen by his PCP Dr. [name] who saw him for a wound on the right leg which is less tender and he is continuing to use bacitracin and has completed her course of doxycycline. Past medical history of coronary artery disease, diabetes mellitus type 1, hyperlipidemia, hypertension, MI, plantar fascial fibromatosis, pneumonia, status post foot surgery due to trauma on the left side, coronary angioplasty. he is a former smoker and quit in July 1996. last hemoglobin A1c was 7.8 % in April of this year 5/22 2017 -- biopsy of the right lower extremity was sent for pathology and the report is that of a basal cell carcinoma with edges of the biopsy are involved. I have called the patient over the  phone( 03/28/16) and discussed the pathology report with him and he  is agreeable to see a surgeon for excision and need full. I have also spoken personally to [name], of [facility] surgical and referred the patient for further wide excision of this lesion and have communicated this to the patient. READMISSION 08/12/18 This is a now 77 year old man with type 1 diabetes. Hemoglobin A1c on 4/29 was 7.9. He is listed as having a history of PAD in the [facility] records although the patient doesn't recall this, doesn't complain of any symptoms of claudication and doesn't believe he has had any prior noninvasive arterial test. He is a nonsmoker. He was here in 2017 with a wound on his Right lower leg. This was biopsied in the clinic and pathology showed a basal cell carcinoma. He went on to have surgery on this area this is currently where I believe his current wounds are located. He generally bumped the lateral part of his right calf 2 weeks ago and has a superficial abrasion. He also has 2 small open areas on the medial part of the tibia in the same area. Our intake nurse noted a stitch coming out of this which was removed. These of the wound sees actually come to the clinic for. However the more concerning area is an area on the tip of the left great toe. He says that this was initially traumatic and he's been to see [name]. [name] been using what I think is Santyl to the wound tip. This has some depth. ABIs in this clinic were non-obtainable on the right and 1.84 on the left. 08/19/18; the patient had arterial studies that did not show evidence of significant hemodynamically compromising occlusions in either leg. There was mild medial atherosclerosis bilaterally. His resting ABIs were within the normal limits at 1.3 on the right and 1.4 on the left. There was normal posterior and anterior tibial artery waveforms and velocities listed on both sides. The patient arrives with what appears to  be a healthy surface over the tip of his left great toe. This is indeed surprising. Still looks somewhat friable however. More concerning is the x-ray that we did that showed erosions of the tuft of the distal phalanx the left great toe consistent with osteomyelitis. I attempted to pull this x-ray up on colon health Link however I can't open the film to look at this myself. 08/26/18; I reviewed the patient's x-ray and colon helpline. Indeed there is fairly obvious erosion of the tip of his left great toe. The wound was initially a traumatic area hitting it sometime in the middle of night in his kitchen. He is a type I diabetic. I have him on Flagyl and Levaquin that I started last week i.e. 7 days ago. He has an appointment with infectious disease on 09/01/18 09/02/18; the patient was seen by [name] of infectious disease. She did not feel the patient needed IV antibiotics. I do feel he needs further oral GEROME, KOKESH ([id number]) [id number] Page 6 of 8 antibiotics however. He does not need anymore Flagyl but he does need another 2 weeks of Levaquin. The areas just about closed. 09/16/18; the patient is completing his Levaquin today. There is no open wound on the tip of the toe Readmission: 11-26-2022 upon evaluation today patient presents for initial inspection here in our clinic concerning issues that he has been having after having been hospitalized due to septic arthritis. Subsequently he has a PICC line that is been removed and he has been discharged from infectious disease in that regard.  Unfortunately he developed shearing wounds over the gluteal region as well as a pressure ulcer over each heel although they are not looking extremely bad nonetheless they do seem to be evidence of pressure which she sustained while hospitalized. Obviously he was very sick and is still recovering as far as that is concerned. Fortunately there does not appear to be any  evidence of active infection systemically which is good news at this point according to Dr. [name]. Patient's wounds also do not appear to be too bad at this point which is good news. Patient does have a history of type 1 diabetes mellitus, peripheral vascular disease, coronary artery disease, congestive heart failure, hypertension, and he is on long-term anticoagulant therapy. 12-03-2022 upon evaluation today patient appears to be doing better to some degree in regard to his ulcers on his heels at this point. We are still waiting on the actual appointment with the vascular surgeons but nonetheless in the meantime I do believe that he is making some good progress currently with regard to loosen up the eschar. I did actually feel like that there was some ability for [place] to remove some of the necrotic tissue today and I discussed that with the patient. He is in agreement with that plan. We also can probably see about making an adjustment in the treatment regimen. 12-10-2022 upon evaluation today patient's wounds were really doing about the same. Again he is still awaiting the evaluation with the vascular surgeon. That should have already been done but had to be pushed back unfortunately. He will be seeing them next week on the seventh. I plan to see him following somewhere around the end of the week I think will probably be best. The patient voiced understanding. 12-19-2022 upon evaluation today patient appears to be doing well currently in regard to his wound we did get the results of his arterial studies which show that he has excellent ABIs with no signs of vascular compromise. For that reason I will go ahead and perform some debridement today to clearway the necrotic debris with his Eliquis that I will be too aggressive so this will still be a stepwise process but we are to go ahead and get that started. 12-27-2022 upon evaluation today patient appears to be doing well currently in regard to his  wounds. They are actually showing signs of excellent improvement. There is some necrotic tissue noted on the surface of wounds to work on clearing some of that away today also I think that we may switch to [facility] dressing to see how things do going forward. He is in agreement with that plan. Subsequently I am extremely pleased with where we stand currently. No fevers, chills, nausea, vomiting, or diarrhea. 01-02-2023 upon evaluation today patient appears to be doing much better in regard to his wounds the right foot appears to be almost healed the left foot though not completely healed is significantly better. Fortunately there does not appear to be any signs of active infection at this point. 01-10-2023 upon evaluation today patient appears to be doing well currently in regard to his wounds. The right heel actually is completely healed the left heel is definitely headed in the right direction and looks to be doing awesome. I do not see any signs of active infection locally nor systemically at this time which is great news. 3/7; his right heel has remained healed. There is no evidence of active infection. The remaining wound is on the lateral aspect of the heel just  above the plantar surface 01-30-2023 upon evaluation today patient appears to be doing well currently in regard to his wound. The heel actually showing signs of improvement which is great news and fortunately I do not see any evidence of active infection locally nor systemically at this time. Overall I think that we are headed in the right direction which is excellent here. No fevers, chills, nausea, vomiting, or diarrhea. I am good have to perform some sharp debridement today. This is just the left heel that is remaining open. 02-06-2023 upon evaluation today patient appears to be doing well currently in regard to his wound. Has been tolerating the dressing changes without complication. Fortunately there does not appear to be any  signs of active infection locally nor systemically although he does have some need for sharp debridement with some definite necrotic tissue at the base of the wound today. 02-13-2023 upon evaluation today patient appears to be doing well currently in regard to his wound which I feel like is slowly clearing up. I did review his growth labs as well the sed rate and C-reactive protein are down his white blood cell count was normal his hemoglobin is coming up. He did see Dr. Joylene Draft on Tuesday and she did continue him on the doxycycline considering his lab numbers but nonetheless he seems to be making some pretty good progress here in my opinion. The wound is slowly turnaround serial debridements are obviously helping with that being said I do believe that he is going to still require dressing changes and close monitoring. 02-20-2023 upon evaluation today patient's wound actually appears to be making some slight progress. This is slowly but surely moving into the right direction. Fortunately I do not see any signs of active infection at this time. Objective Constitutional Well-nourished and well-hydrated in no acute distress. Vitals Time Taken: 12:09 PM, Height: 69 in, Weight: 190 lbs, BMI: 28.1, Temperature: 97.9 F, Pulse: 93 bpm, Respiratory Rate: 18 breaths/min, Blood Pressure: 107/52 mmHg. Respiratory normal breathing without difficulty. Psychiatric this patient is able to make decisions and demonstrates good insight into disease process. Alert and Oriented x 3. pleasant and cooperative. General Notes: Upon inspection patient's wound is showing signs of improvement his x-ray was negative for any signs of osteomyelitis which is good I do not think he is going require an MRI yet but this is something that we will keep in mind for future. Integumentary (Hair, Skin) Wound #5 status is Open. Original cause of wound was Pressure Injury. The date acquired was: 10/20/2022. The wound has been in  treatment 12 weeks. The wound is located on the Left Calcaneus. The wound measures 1.8cm length x 1.2cm width x 0.9cm depth; 1.696cm^2 area and 1.527cm^3 volume. There is Fat HERNANDO, REALI ([id number]) [id number] Page 7 of 8 Layer (Subcutaneous Tissue) exposed. There is no tunneling or undermining noted. There is a medium amount of serosanguineous drainage noted. There is small (1-33%) red, pink granulation within the wound bed. There is a large (67-100%) amount of necrotic tissue within the wound bed including Adherent Slough. Assessment Active Problems ICD-10 Type 1 diabetes mellitus with foot ulcer Other specified peripheral vascular diseases Pressure ulcer of left heel, stage 3 Pressure ulcer of right heel, stage 3 Non-pressure chronic ulcer of buttock with fat layer exposed Atherosclerotic heart disease of native coronary artery without angina pectoris Chronic combined systolic (congestive) and diastolic (congestive) heart failure Essential (primary) hypertension Procedures Wound #5 Pre-procedure diagnosis of Wound #5 is a Pressure Ulcer located on  the Left Calcaneus . There was a Excisional Skin/Subcutaneous Tissue Debridement with a total area of 2.16 sq cm performed by [name], [name]. With the following instrument(s): Curette to remove Viable and Non-Viable tissue/material. Material removed includes Subcutaneous Tissue and Slough and after achieving pain control using Lidocaine 4% T opical Solution. No specimens were taken. A time out was conducted at 12:39, prior to the start of the procedure. A Moderate amount of bleeding was controlled with Pressure. The procedure was tolerated well. Post Debridement Measurements: 1.8cm length x 1.2cm width x 0.9cm depth; 1.527cm^3 volume. Post debridement Stage noted as Category/Stage III. Character of Wound/Ulcer Post Debridement is stable. Post procedure Diagnosis Wound #5: Same as  Pre-Procedure Plan Follow-up Appointments: Return Appointment in 1 week. Nurse Visit as needed Home Health: DISCONTINUE Home Health for Wound Care. - Patient no longer needs home health wound care Bathing/ Shower/ Hygiene: May shower; gently cleanse wound with antibacterial soap, rinse and pat dry prior to dressing wounds No tub bath. - no soaking foot in tub Anesthetic (Use 'Patient Medications' Section for Anesthetic Order Entry): Lidocaine applied to wound bed Edema Control - Lymphedema / Segmental Compressive Device / Other: Elevate, Exercise Daily and Avoid Standing for Long Periods of Time. Elevate legs to the level of the heart and pump ankles as often as possible Elevate leg(s) parallel to the floor when sitting. DO YOUR BEST to sleep in the bed at night. DO NOT sleep in your recliner. Long hours of sitting in a recliner leads to swelling of the legs and/or potential wounds on your backside. WOUND #5: - Calcaneus Wound Laterality: Left Cleanser: Soap and Water 1 x Per Day/30 Days Discharge Instructions: Gently cleanse wound with antibacterial soap, rinse and pat dry prior to dressing wounds Prim Dressing: Hydrofera Blue Ready Transfer Foam, 2.5x2.5 (in/in) (Generic) 1 x Per Day/30 Days ary Discharge Instructions: Apply Hydrofera Blue Ready to wound bed as directed Please cut a small cirle to fit in wound bed and then place a square piece over top Secondary Dressing: (BORDER) Zetuvit Plus SILICONE BORDER Dressing 4x4 (in/in) (Generic) 1 x Per Day/30 Days Discharge Instructions: Please do not put silicone bordered dressings under wraps. Use non-bordered dressing only. 1. I would recommend currently that we have the patient continue to monitor for any evidence of infection or worsening. Based on what I am seeing I do believe that we are making some pretty good progress here. 2. I am also going to recommend that the patient should continue with the [facility] I think this is  doing a good job. 3. I am also going to suggest trying Tegaderm over top of the [facility] to see if this will keep it from being pulled off when he removes his stocking. They are going to give this a shot. We will see patient back for reevaluation in 1 week here in the clinic. If anything worsens or changes patient will contact our office for additional recommendations. Electronic Signature(s) Signed: 02/20/2023 4:59:26 PM By: [name] [name] Entered By: [name] on 02/20/2023 16:59:26 Avis, [name] ([id number]) [id number] Page 8 of 8 -------------------------------------------------------------------------------- SuperBill Details Patient Name: Date of Service: [name], [name] 02/20/2023 Medical Record Number: [id number] Patient Account Number: [id number] Date of Birth/Sex: Treating RN: 1946-10-07 (77 y.o. [name] Primary Care Provider: [name] Other Clinician: Referring Provider: Treating Provider/Extender: [name] in Treatment: 12 Diagnosis Coding ICD-10 Codes Code Description [phone] Type 1 diabetes mellitus with foot ulcer  I73.89 Other specified peripheral vascular diseases L89.623 Pressure ulcer of left heel, stage 3 L89.613 Pressure ulcer of right heel, stage 3 L98.412 Non-pressure chronic ulcer of buttock with fat layer exposed I25.10 Atherosclerotic heart disease of native coronary artery without angina pectoris I50.42 Chronic combined systolic (congestive) and diastolic (congestive) heart failure I10 Essential (primary) hypertension Facility Procedures : CPT4 Code: 16109604 Description: 11042 - DEB SUBQ TISSUE 20 SQ CM/< ICD-10 Diagnosis Description L89.623 Pressure ulcer of left heel, stage 3 Modifier: Quantity: 1 Physician Procedures : CPT4 Code Description Modifier [id number] 11042 - WC PHYS SUBQ TISS 20 SQ CM ICD-10 Diagnosis Description L89.623 Pressure ulcer of left heel,  stage 3 Quantity: 1 Electronic Signature(s) Signed: 02/20/2023 4:59:35 PM By: [name] [name] Entered By: [name] on 02/20/2023 16:59:35

## 2023-02-20 NOTE — Progress Notes (Signed)
DEVONTEZ, ASCHER ([id number]) [id number] Page 1 of 1 Visit Report for 02/20/2023 Fall Risk Assessment Details Patient Name: Date of Service: [name], [name] 02/20/2023 12:00 PM Medical Record Number: [id number] Patient Account Number: [id number] Date of Birth/Sex: Treating RN: 01/12/1946 (77 y.o. [name] Primary Care Jeral Zick: [name] Other Clinician: Referring Denzell Colasanti: Treating Chloeann Alfred/Extender: [name] in Treatment: 12 Fall Risk Assessment Items Have you had 2 or more falls in the last 12 monthso 0 No Have you had any fall that resulted in injury in the last 12 monthso 0 No FALLS RISK SCREEN History of falling - immediate or within 3 months 25 Yes Secondary diagnosis (Do you have 2 or more medical diagnoseso) 0 No Ambulatory aid None/bed rest/wheelchair/nurse 0 No Crutches/cane/walker 15 Yes Furniture 0 No Intravenous therapy Access/Saline/Heparin Lock 0 No Gait/Transferring Normal/ bed rest/ wheelchair 0 No Weak (short steps with or without shuffle, stooped but able to lift head while walking, may seek 10 Yes support from furniture) Impaired (short steps with shuffle, may have difficulty arising from chair, head down, impaired 0 No balance) Mental Status Oriented to own ability 0 Yes Electronic Signature(s) Signed: 02/20/2023 1:36:50 PM By: Angelina Pih Entered By: Angelina Pih on 02/20/2023 13:36:50

## 2023-02-21 NOTE — Progress Notes (Signed)
[name], BRANSCOMB ([id number]) [id number] Page 1 of 7 Visit Report for 02/20/2023 Arrival Information Details Patient Name: Date of Service: [name], [name] 02/20/2023 12:00 PM Medical Record Number: [id number] Patient Account Number: [id number] Date of Birth/Sex: Treating RN: 10-Dec-1945 (77 y.o. [name] Primary Care Ansley Mangiapane: [name] Other Clinician: Referring Junah Yam: Treating Dinara Lupu/Extender: [name] in Treatment: 12 Visit Information History Since Last Visit Added or deleted any medications: No Patient Arrived: [name] Any new allergies or adverse reactions: No Arrival Time: 12:08 Had a fall or experienced change in Yes Accompanied By: spouse activities of daily living that may affect Transfer Assistance: None risk of falls: Patient Identification Verified: Yes Hospitalized since last visit: No Secondary Verification Process Completed: Yes Pain Present Now: No Patient Requires Transmission-Based Precautions: No Patient Has Alerts: Yes Patient Alerts: Type I Diabetic eliquis ABI/TBI normal see scan Notes fall 3 wks ago, pt states did not hit head Electronic Signature(s) Signed: 02/20/2023 5:07:08 PM By: Angelina Pih Entered By: Angelina Pih on 02/20/2023 12:08:58 -------------------------------------------------------------------------------- Clinic Level of Care Assessment Details Patient Name: Date of Service: [name], [name] 02/20/2023 12:00 PM Medical Record Number: [id number] Patient Account Number: [id number] Date of Birth/Sex: Treating RN: [birth year] (77 y.o. [name] Primary Care Venus Gilles: [name] Other Clinician: Referring Cherry Wittwer: Treating Pennye Beeghly/Extender: [name] in Treatment: 12 Clinic Level of Care Assessment Items TOOL 1 Quantity Score []  - 0 Use when EandM and Procedure is performed on INITIAL visit ASSESSMENTS -  Nursing Assessment / Reassessment []  - 0 General Physical Exam (combine w/ comprehensive assessment (listed just below) when performed on new pt. evals) []  - 0 Comprehensive Assessment (HX, ROS, Risk Assessments, Wounds Hx, etc.) ASSESSMENTS - Wound and Skin Assessment / Reassessment []  - 0 Dermatologic / Skin Assessment (not related to wound area) ASSESSMENTS - Ostomy and/or Continence Assessment and Care []  - 0 Incontinence Assessment and Management []  - 0 Ostomy Care Assessment and Management (repouching, etc.) PROCESS - Coordination of Care []  - 0 Simple Patient / Family Education for ongoing care []  - 0 Complex (extensive) Patient / Family Education for ongoing care []  - 0 Staff obtains Chiropractor, Records, T Results / Process Orders est []  - 0 Staff telephones HHA, Nursing Homes / Clarify orders / etc []  - 0 Routine Transfer to another Facility (non-emergent condition) []  - 0 Routine Hospital Admission (non-emergent condition) Harnois, Careem H ([id number]) [id number] Page 2 of 7 []  - 0 New Admissions / Manufacturing engineer / Ordering NPWT Apligraf, etc. , []  - 0 Emergency Hospital Admission (emergent condition) PROCESS - Special Needs []  - 0 Pediatric / Minor Patient Management []  - 0 Isolation Patient Management []  - 0 Hearing / Language / Visual special needs []  - 0 Assessment of Community assistance (transportation, D/C planning, etc.) []  - 0 Additional assistance / Altered mentation []  - 0 Support Surface(s) Assessment (bed, cushion, seat, etc.) INTERVENTIONS - Miscellaneous []  - 0 External ear exam []  - 0 Patient Transfer (multiple staff / Nurse, adult / Similar devices) []  - 0 Simple Staple / Suture removal (25 or less) []  - 0 Complex Staple / Suture removal (26 or more) []  - 0 Hypo/Hyperglycemic Management (do not check if billed separately) []  - 0 Ankle / Brachial Index (ABI) - do not check if billed  separately Has the patient been seen at the hospital within the last three years: Yes Total Score: 0 Level Of Care: ____ Electronic Signature(s) Signed: 02/20/2023  5:07:08 PM By: Angelina Pih Entered By: Angelina Pih on 02/20/2023 13:33:54 -------------------------------------------------------------------------------- Encounter Discharge Information Details Patient Name: Date of Service: [name], [name] 02/20/2023 12:00 PM Medical Record Number: [id number] Patient Account Number: [id number] Date of Birth/Sex: Treating RN: 1946-09-09 (77 y.o. [name] Primary Care Cedrica Brune: [name] Other Clinician: Referring Stepheny Canal: Treating Tod Abrahamsen/Extender: [name] in Treatment: 12 Encounter Discharge Information Items Post Procedure Vitals Discharge Condition: Stable Temperature (F): 97.9 Ambulatory Status: Cane Pulse (bpm): 93 Discharge Destination: Home Respiratory Rate (breaths/min): 18 Transportation: Private Auto Blood Pressure (mmHg): 107/52 Accompanied By: spouse Schedule Follow-up Appointment: Yes Clinical Summary of Care: Electronic Signature(s) Signed: 02/20/2023 1:37:23 PM By: Angelina Pih Entered By: Angelina Pih on 02/20/2023 13:37:23 -------------------------------------------------------------------------------- Lower Extremity Assessment Details Patient Name: Date of Service: [name], [name] 02/20/2023 12:00 PM Medical Record Number: [id number] Patient Account Number: [id number] Date of Birth/Sex: Treating RN: January 09, 1946 (77 y.o. [name] Primary Care Cianna Kasparian: [name] Other Clinician: Referring Robbi Scurlock: Treating Mohammedali Bedoy/Extender: [name] in Treatment: 12 Edema Assessment P[Left: JAMAINE, QUINTIN ([id number])] [name]: [id number] Page 3 of 7] Assessed: [Left: No] [Right: No] [Left: Edema] [Right: :] Calf Left: Right: Point of  Measurement: 34 cm From Medial Instep 39.3 cm Ankle Left: Right: Point of Measurement: 10 cm From Medial Instep 28.3 cm Vascular Assessment Pulses: Dorsalis Pedis Doppler Audible: [Left:Yes] Posterior Tibial Palpable: [Left:Yes] Electronic Signature(s) Signed: 02/20/2023 5:07:08 PM By: Angelina Pih Entered By: Angelina Pih on 02/20/2023 12:22:09 -------------------------------------------------------------------------------- Multi Wound Chart Details Patient Name: Date of Service: [name], [name] 02/20/2023 12:00 PM Medical Record Number: [id number] Patient Account Number: [id number] Date of Birth/Sex: Treating RN: [birth year] (77 y.o. [name] Primary Care Aleigha Gilani: [name] Other Clinician: Referring Valinda Fedie: Treating Theodor Mustin/Extender: [name] in Treatment: 12 Vital Signs Height(in): 69 Pulse(bpm): 93 Weight(lbs): 190 Blood Pressure(mmHg): 107/52 Body Mass Index(BMI): 28.1 Temperature(F): 97.9 Respiratory Rate(breaths/min): 18 [5:Photos:] [N/A:N/A] Left Calcaneus N/A N/A Wound Location: Pressure Injury N/A N/A Wounding Event: Pressure Ulcer N/A N/A Primary Etiology: Coronary Artery Disease, N/A N/A Comorbid History: Hypertension, Type I Diabetes, Osteoarthritis, Received Chemotherapy 10/20/2022 N/A N/A Date Acquired: 12 N/A N/A Weeks of Treatment: Open N/A N/A Wound Status: No N/A N/A Wound Recurrence: 1.8x1.2x0.9 N/A N/A Measurements L x W x D (cm) 1.696 N/A N/A A (cm) : rea 1.527 N/A N/A Volume (cm) : 64.00% N/A N/A % Reduction in A rea: -224.20% N/A N/A % Reduction in Volume: Category/Stage III N/A N/A Classification: Medium N/A N/A Exudate A mount: Serosanguineous N/A N/A Exudate Type: red, brown N/A N/A Exudate Color: Small (1-33%) N/A N/A Granulation A mount: Red, Pink N/A N/A Granulation QualityDIMETRIUS, MONTFORT ([id number]) [id number] Page 4 of  7 Large (67-100%) N/A N/A Necrotic Amount: Fat Layer (Subcutaneous Tissue): Yes N/A N/A Exposed Structures: Fascia: No Tendon: No Muscle: No Joint: No Bone: No Small (1-33%) N/A N/A Epithelialization: Treatment Notes Electronic Signature(s) Signed: 02/20/2023 5:07:08 PM By: Angelina Pih Entered By: Angelina Pih on 02/20/2023 12:39:33 -------------------------------------------------------------------------------- Multi-Disciplinary Care Plan Details Patient Name: Date of Service: [name], [name] 02/20/2023 12:00 PM Medical Record Number: [id number] Patient Account Number: [id number] Date of Birth/Sex: Treating RN: Jul 03, 1946 (77 y.o. [name] Primary Care Neddie Steedman: [name] Other Clinician: Referring Carlean Crowl: Treating Esraa Seres/Extender: [name] in Treatment: 12 Active Inactive Pressure Nursing Diagnoses: Potential for impaired tissue integrity related to pressure, friction, moisture, and shear Goals: Patient will remain  free of pressure ulcers Date Initiated: 11/26/2022 Target Resolution Date: 03/11/2023 Goal Status: Active Interventions: Assess: immobility, friction, shearing, incontinence upon admission and as needed Assess offloading mechanisms upon admission and as needed Assess potential for pressure ulcer upon admission and as needed Notes: Wound/Skin Impairment Nursing Diagnoses: Knowledge deficit related to ulceration/compromised skin integrity Goals: Patient/caregiver will verbalize understanding of skin care regimen Date Initiated: 11/26/2022 Date Inactivated: 01/16/2023 Target Resolution Date: 12/03/2022 Goal Status: Met Ulcer/skin breakdown will have a volume reduction of 30% by week 4 Date Initiated: 11/26/2022 Date Inactivated: 02/13/2023 Target Resolution Date: 12/24/2022 Goal Status: Met Ulcer/skin breakdown will have a volume reduction of 50% by week 8 Date Initiated: 11/26/2022 Date Inactivated:  02/13/2023 Target Resolution Date: 01/21/2023 Goal Status: Unmet Unmet Reason: underlying conditions Ulcer/skin breakdown will have a volume reduction of 80% by week 12 Date Initiated: 11/26/2022 Target Resolution Date: 02/27/2023 Goal Status: Active Ulcer/skin breakdown will heal within 14 weeks Date Initiated: 11/26/2022 Target Resolution Date: 03/18/2023 Goal Status: Active Interventions: Assess patient/caregiver ability to obtain necessary supplies Assess patient/caregiver ability to perform ulcer/skin care regimen upon admission and as needed Assess ulceration(s) every visit Notes: OBI, SCRIMA ([id number]) [id number] Page 5 of 7 week 4 30% healing met for right calcaneus but not met for left calcaneus. Electronic Signature(s) Signed: 02/20/2023 1:34:58 PM By: Angelina Pih Entered By: Angelina Pih on 02/20/2023 13:34:57 -------------------------------------------------------------------------------- Pain Assessment Details Patient Name: Date of Service: [name], [name] 02/20/2023 12:00 PM Medical Record Number: [id number] Patient Account Number: [id number] Date of Birth/Sex: Treating RN: January 13, 1946 (77 y.o. [name] Primary Care Addysin Porco: [name] Other Clinician: Referring Elta Angell: Treating Cyndee Giammarco/Extender: [name] in Treatment: 12 Active Problems Location of Pain Severity and Description of Pain Patient Has Paino No Site Locations Rate the pain. Current Pain Level: 0 Pain Management and Medication Current Pain Management: Electronic Signature(s) Signed: 02/20/2023 5:07:08 PM By: Angelina Pih Entered By: Angelina Pih on 02/20/2023 12:11:03 -------------------------------------------------------------------------------- Patient/Caregiver Education Details Patient Name: Date of Service: [name], CLA Reine Just 4/11/2024andnbsp12:00 PM Medical Record Number: [id number] Patient  Account Number: [id number] Date of Birth/Gender: Treating RN: 06/06/1946 (77 y.o. [name] Primary Care Physician: [name] Other Clinician: Referring Physician: Treating Physician/Extender: [name] in Treatment: 12 Education Assessment Education Provided To: Patient and Caregiver Education Topics Provided Offloading: Handouts: How Offloading Helps Foot Wounds Heal Methods: Explain/Verbal Responses: State content correctly JAWAAN, ADACHI ([id number]) [id number] Page 6 of 7 Wound Debridement: Handouts: Wound Debridement Methods: Explain/Verbal Responses: State content correctly Wound/Skin Impairment: Handouts: Caring for Your Ulcer Methods: Explain/Verbal Responses: State content correctly Electronic Signature(s) Signed: 02/20/2023 5:07:08 PM By: Angelina Pih Entered By: Angelina Pih on 02/20/2023 13:35:20 -------------------------------------------------------------------------------- Wound Assessment Details Patient Name: Date of Service: [name], [name] 02/20/2023 12:00 PM Medical Record Number: [id number] Patient Account Number: [id number] Date of Birth/Sex: Treating RN: [birth year] (77 y.o. [name] Primary Care Lodie Waheed: [name] Other Clinician: Referring Inayah Woodin: Treating Mykeal Carrick/Extender: [name] in Treatment: 12 Wound Status Wound Number: 5 Primary Pressure Ulcer Etiology: Wound Location: Left Calcaneus Wound Open Wounding Event: Pressure Injury Status: Date Acquired: 10/20/2022 Comorbid Coronary Artery Disease, Hypertension, Type I Diabetes, Weeks Of Treatment: 12 History: Osteoarthritis, Received Chemotherapy Clustered Wound: No Photos Wound Measurements Length: (cm) 1.8 Width: (cm) 1.2 Depth: (cm) 0.9 Area: (cm) 1.696 Volume: (cm) 1.527 % Reduction in Area: 64% % Reduction in Volume: -224.2% Epithelialization: Small  (1-33%) Tunneling: No Undermining: No Wound  Description Classification: Category/Stage III Exudate Amount: Medium Exudate Type: Serosanguineous Exudate Color: red, brown Foul Odor After Cleansing: No Slough/Fibrino Yes Wound Bed Granulation Amount: Small (1-33%) Exposed Structure Granulation Quality: Red, Pink Fascia Exposed: No Necrotic Amount: Large (67-100%) Fat Layer (Subcutaneous Tissue) Exposed: Yes Necrotic Quality: Adherent Slough Tendon Exposed: No Muscle Exposed: No Joint Exposed: No Bone Exposed: No Treatment Notes DAYNA, GEURTS ([id number]) [id number] Page 7 of 7 Wound #5 (Calcaneus) Wound Laterality: Left Cleanser Soap and Water Discharge Instruction: Gently cleanse wound with antibacterial soap, rinse and pat dry prior to dressing wounds Peri-Wound Care Topical Primary Dressing Hydrofera Blue Ready Transfer Foam, 2.5x2.5 (in/in) Discharge Instruction: Apply Hydrofera Blue Ready to wound bed as directed Please cut a small cirle to fit in wound bed and then place a square piece over top Secondary Dressing (BORDER) Zetuvit Plus SILICONE BORDER Dressing 4x4 (in/in) Discharge Instruction: Please do not put silicone bordered dressings under wraps. Use non-bordered dressing only. Secured With Compression Wrap Compression Stockings Facilities manager) Signed: 02/20/2023 5:07:08 PM By: Angelina Pih Entered By: Angelina Pih on 02/20/2023 12:21:47 -------------------------------------------------------------------------------- Vitals Details Patient Name: Date of Service: [name], [name] 02/20/2023 12:00 PM Medical Record Number: [id number] Patient Account Number: [id number] Date of Birth/Sex: Treating RN: [birth year] (77 y.o. [name] Primary Care Tinzley Dalia: [name] Other Clinician: Referring Christionna Poland: Treating Leyana Whidden/Extender: [name] in Treatment: 12 Vital  Signs Time Taken: 12:09 Temperature (F): 97.9 Height (in): 69 Pulse (bpm): 93 Weight (lbs): 190 Respiratory Rate (breaths/min): 18 Body Mass Index (BMI): 28.1 Blood Pressure (mmHg): 107/52 Reference Range: 80 - 120 mg / dl Electronic Signature(s) Signed: 02/20/2023 5:07:08 PM By: Angelina Pih Entered By: Angelina Pih on 02/20/2023 12:10:49

## 2023-02-25 DIAGNOSIS — E10621 Type 1 diabetes mellitus with foot ulcer: Secondary | ICD-10-CM | POA: Diagnosis not present

## 2023-02-25 DIAGNOSIS — L89620 Pressure ulcer of left heel, unstageable: Secondary | ICD-10-CM | POA: Diagnosis not present

## 2023-02-25 DIAGNOSIS — L98412 Non-pressure chronic ulcer of buttock with fat layer exposed: Secondary | ICD-10-CM | POA: Diagnosis not present

## 2023-02-25 DIAGNOSIS — S91302A Unspecified open wound, left foot, initial encounter: Secondary | ICD-10-CM | POA: Diagnosis not present

## 2023-02-25 DIAGNOSIS — L89610 Pressure ulcer of right heel, unstageable: Secondary | ICD-10-CM | POA: Diagnosis not present

## 2023-02-26 DIAGNOSIS — R809 Proteinuria, unspecified: Secondary | ICD-10-CM | POA: Diagnosis not present

## 2023-02-26 DIAGNOSIS — I10 Essential (primary) hypertension: Secondary | ICD-10-CM | POA: Diagnosis not present

## 2023-02-26 DIAGNOSIS — I2510 Atherosclerotic heart disease of native coronary artery without angina pectoris: Secondary | ICD-10-CM | POA: Diagnosis not present

## 2023-02-26 DIAGNOSIS — E1029 Type 1 diabetes mellitus with other diabetic kidney complication: Secondary | ICD-10-CM | POA: Diagnosis not present

## 2023-02-27 ENCOUNTER — Encounter: Payer: Medicare HMO | Admitting: Physician Assistant

## 2023-02-27 DIAGNOSIS — I110 Hypertensive heart disease with heart failure: Secondary | ICD-10-CM | POA: Diagnosis not present

## 2023-02-27 DIAGNOSIS — I5042 Chronic combined systolic (congestive) and diastolic (congestive) heart failure: Secondary | ICD-10-CM | POA: Diagnosis not present

## 2023-02-27 DIAGNOSIS — L89623 Pressure ulcer of left heel, stage 3: Secondary | ICD-10-CM | POA: Diagnosis not present

## 2023-02-27 DIAGNOSIS — L89613 Pressure ulcer of right heel, stage 3: Secondary | ICD-10-CM | POA: Diagnosis not present

## 2023-02-27 DIAGNOSIS — E10622 Type 1 diabetes mellitus with other skin ulcer: Secondary | ICD-10-CM | POA: Diagnosis not present

## 2023-02-27 DIAGNOSIS — E10621 Type 1 diabetes mellitus with foot ulcer: Secondary | ICD-10-CM | POA: Diagnosis not present

## 2023-02-27 DIAGNOSIS — L98412 Non-pressure chronic ulcer of buttock with fat layer exposed: Secondary | ICD-10-CM | POA: Diagnosis not present

## 2023-02-27 DIAGNOSIS — E785 Hyperlipidemia, unspecified: Secondary | ICD-10-CM | POA: Diagnosis not present

## 2023-02-27 DIAGNOSIS — E1051 Type 1 diabetes mellitus with diabetic peripheral angiopathy without gangrene: Secondary | ICD-10-CM | POA: Diagnosis not present

## 2023-02-27 NOTE — Progress Notes (Addendum)
BRADAN, CONGROVE ([id number]) [id number] Page 1 of 8 Visit Report for 02/27/2023 Chief Complaint Document Details Patient Name: Date of Service: [name], [name] 02/27/2023 12:00 PM Medical Record Number: [id number] Patient Account Number: [id number] Date of Birth/Sex: Treating RN: 1946/08/05 (77 y.o. [name] Primary Care Provider: [name] Other Clinician: Referring Provider: Treating Provider/Extender: [name] in Treatment: 13 Information Obtained from: Patient Chief Complaint Left heel pressure ulcer Electronic Signature(s) Signed: 02/27/2023 12:26:45 PM By: [name] [name] Entered By: [name] on 02/27/2023 12:26:45 -------------------------------------------------------------------------------- Debridement Details Patient Name: Date of Service: [name], [name] 02/27/2023 12:00 PM Medical Record Number: [id number] Patient Account Number: [id number] Date of Birth/Sex: Treating RN: [birth year] (77 y.o. [name] Primary Care Provider: [name] Other Clinician: Referring Provider: Treating Provider/Extender: [name] in Treatment: 13 Debridement Performed for Assessment: Wound #5 Left Calcaneus Performed By: Physician [name], [name] Debridement Type: Debridement Level of Consciousness (Pre-procedure): Awake and Alert Pre-procedure Verification/Time Out Yes - 12:31 Taken: Pain Control: Lidocaine 4% T opical Solution T Area Debrided (L x W): otal 1.7 (cm) x 1.3 (cm) = 2.21 (cm) Tissue and other material debrided: Viable, Non-Viable, Slough, Subcutaneous, Slough Level: Skin/Subcutaneous Tissue Debridement Description: Excisional Instrument: Curette Bleeding: Moderate Hemostasis Achieved: Pressure Response to Treatment: Procedure was tolerated well Level of Consciousness (Post- Awake and Alert procedure): Post Debridement Measurements of Total  Wound Length: (cm) 1.7 Stage: Category/Stage III Width: (cm) 1.3 Depth: (cm) 0.1 Volume: (cm) 0.174 Character of Wound/Ulcer Post Debridement: Stable Post Procedure Diagnosis Same as Pre-procedure Electronic Signature(s) Signed: 02/27/2023 5:10:54 PM By: Angelina Pih Signed: 02/28/2023 1:44:50 PM By: [name] [name] Entered By: Angelina Pih on 02/27/2023 12:31:57 HPI Details -------------------------------------------------------------------------------- [name] ([id number]) [id number] Page 2 of 8 Patient Name: Date of Service: [name], [name] 02/27/2023 12:00 PM Medical Record Number: [id number] Patient Account Number: [id number] Date of Birth/Sex: Treating RN: 12-06-1945 (77 y.o. [name] Primary Care Provider: [name] Other Clinician: Referring Provider: Treating Provider/Extender: [name] in Treatment: 13 History of Present Illness HPI Description: 77 year old male recently seen by his PCP Dr. [name] who saw him for a wound on the right leg which is less tender and he is continuing to use bacitracin and has completed her course of doxycycline. Past medical history of coronary artery disease, diabetes mellitus type 1, hyperlipidemia, hypertension, MI, plantar fascial fibromatosis, pneumonia, status post foot surgery due to trauma on the left side, coronary angioplasty. he is a former smoker and quit in July 1996. last hemoglobin A1c was 7.8 % in April of this year 5/22 2017 -- biopsy of the right lower extremity was sent for pathology and the report is that of a basal cell carcinoma with edges of the biopsy are involved. I have called the patient over the phone( 03/28/16) and discussed the pathology report with him and he is agreeable to see a surgeon for excision and need full. I have also spoken personally to [name], of [facility] surgical and referred the patient for further wide  excision of this lesion and have communicated this to the patient. READMISSION 08/12/18 This is a now 77 year old man with type 1 diabetes. Hemoglobin A1c on 4/29 was 7.9. He is listed as having a history of PAD in the [facility] records although the patient doesn't recall this, doesn't complain of any symptoms of claudication and doesn't believe he has had any prior noninvasive  arterial test. He is a nonsmoker. He was here in 2017 with a wound on his Right lower leg. This was biopsied in the clinic and pathology showed a basal cell carcinoma. He went on to have surgery on this area this is currently where I believe his current wounds are located. He generally bumped the lateral part of his right calf 2 weeks ago and has a superficial abrasion. He also has 2 small open areas on the medial part of the tibia in the same area. Our intake nurse noted a stitch coming out of this which was removed. These of the wound sees actually come to the clinic for. However the more concerning area is an area on the tip of the left great toe. He says that this was initially traumatic and he's been to see [name]. [name] been using what I think is Santyl to the wound tip. This has some depth. ABIs in this clinic were non-obtainable on the right and 1.84 on the left. 08/19/18; the patient had arterial studies that did not show evidence of significant hemodynamically compromising occlusions in either leg. There was mild medial atherosclerosis bilaterally. His resting ABIs were within the normal limits at 1.3 on the right and 1.4 on the left. There was normal posterior and anterior tibial artery waveforms and velocities listed on both sides. The patient arrives with what appears to be a healthy surface over the tip of his left great toe. This is indeed surprising. Still looks somewhat friable however. More concerning is the x-ray that we did that showed erosions of the tuft of the distal phalanx the left  great toe consistent with osteomyelitis. I attempted to pull this x-ray up on colon health Link however I can't open the film to look at this myself. 08/26/18; I reviewed the patient's x-ray and colon helpline. Indeed there is fairly obvious erosion of the tip of his left great toe. The wound was initially a traumatic area hitting it sometime in the middle of night in his kitchen. He is a type I diabetic. I have him on Flagyl and Levaquin that I started last week i.e. 7 days ago. He has an appointment with infectious disease on 09/01/18 09/02/18; the patient was seen by [name] of infectious disease. She did not feel the patient needed IV antibiotics. I do feel he needs further oral antibiotics however. He does not need anymore Flagyl but he does need another 2 weeks of Levaquin. The areas just about closed. 09/16/18; the patient is completing his Levaquin today. There is no open wound on the tip of the toe Readmission: 11-26-2022 upon evaluation today patient presents for initial inspection here in our clinic concerning issues that he has been having after having been hospitalized due to septic arthritis. Subsequently he has a PICC line that is been removed and he has been discharged from infectious disease in that regard. Unfortunately he developed shearing wounds over the gluteal region as well as a pressure ulcer over each heel although they are not looking extremely bad nonetheless they do seem to be evidence of pressure which she sustained while hospitalized. Obviously he was very sick and is still recovering as far as that is concerned. Fortunately there does not appear to be any evidence of active infection systemically which is good news at this point according to Dr. [name]. Patient's wounds also do not appear to be too bad at this point which is good news. Patient does have a history of type 1  diabetes mellitus, peripheral vascular disease, coronary artery disease, congestive  heart failure, hypertension, and he is on long-term anticoagulant therapy. 12-03-2022 upon evaluation today patient appears to be doing better to some degree in regard to his ulcers on his heels at this point. We are still waiting on the actual appointment with the vascular surgeons but nonetheless in the meantime I do believe that he is making some good progress currently with regard to loosen up the eschar. I did actually feel like that there was some ability for [place] to remove some of the necrotic tissue today and I discussed that with the patient. He is in agreement with that plan. We also can probably see about making an adjustment in the treatment regimen. 12-10-2022 upon evaluation today patient's wounds were really doing about the same. Again he is still awaiting the evaluation with the vascular surgeon. That should have already been done but had to be pushed back unfortunately. He will be seeing them next week on the seventh. I plan to see him following somewhere around the end of the week I think will probably be best. The patient voiced understanding. 12-19-2022 upon evaluation today patient appears to be doing well currently in regard to his wound we did get the results of his arterial studies which show that he has excellent ABIs with no signs of vascular compromise. For that reason I will go ahead and perform some debridement today to clearway the necrotic debris with his Eliquis that I will be too aggressive so this will still be a stepwise process but we are to go ahead and get that started. 12-27-2022 upon evaluation today patient appears to be doing well currently in regard to his wounds. They are actually showing signs of excellent improvement. There is some necrotic tissue noted on the surface of wounds to work on clearing some of that away today also I think that we may switch to [facility] dressing to see how things do going forward. He is in agreement with that plan. Subsequently I  am extremely pleased with where we stand currently. No fevers, chills, nausea, vomiting, or diarrhea. 01-02-2023 upon evaluation today patient appears to be doing much better in regard to his wounds the right foot appears to be almost healed the left foot though not completely healed is significantly better. Fortunately there does not appear to be any signs of active infection at this point. 01-10-2023 upon evaluation today patient appears to be doing well currently in regard to his wounds. The right heel actually is completely healed the left heel is definitely headed in the right direction and looks to be doing awesome. I do not see any signs of active infection locally nor systemically at this time which is great news. 3/7; his right heel has remained healed. There is no evidence of active infection. The remaining wound is on the lateral aspect of the heel just above the plantar surface Quin, Sherrick [name] ([id number]) [id number] Page 3 of 8 01-30-2023 upon evaluation today patient appears to be doing well currently in regard to his wound. The heel actually showing signs of improvement which is great news and fortunately I do not see any evidence of active infection locally nor systemically at this time. Overall I think that we are headed in the right direction which is excellent here. No fevers, chills, nausea, vomiting, or diarrhea. I am good have to perform some sharp debridement today. This is just the left heel that is remaining open. 02-06-2023 upon evaluation today  patient appears to be doing well currently in regard to his wound. Has been tolerating the dressing changes without complication. Fortunately there does not appear to be any signs of active infection locally nor systemically although he does have some need for sharp debridement with some definite necrotic tissue at the base of the wound today. 02-13-2023 upon evaluation today patient appears to be doing  well currently in regard to his wound which I feel like is slowly clearing up. I did review his growth labs as well the sed rate and C-reactive protein are down his white blood cell count was normal his hemoglobin is coming up. He did see Dr. Joylene Draft on Tuesday and she did continue him on the doxycycline considering his lab numbers but nonetheless he seems to be making some pretty good progress here in my opinion. The wound is slowly turnaround serial debridements are obviously helping with that being said I do believe that he is going to still require dressing changes and close monitoring. 02-20-2023 upon evaluation today patient's wound actually appears to be making some slight progress. This is slowly but surely moving into the right direction. Fortunately I do not see any signs of active infection at this time. 02-27-2023 upon evaluation today patient appears to be doing well currently in regard to his wound. He has been tolerating the dressing changes. I think the Hydrofera Blue is doing a good job and he seems to be doing well with that currently. Electronic Signature(s) Signed: 02/27/2023 1:01:14 PM By: [name] [name] Entered By: [name] on 02/27/2023 13:01:14 -------------------------------------------------------------------------------- Physical Exam Details Patient Name: Date of Service: [name], [name] 02/27/2023 12:00 PM Medical Record Number: [id number] Patient Account Number: [id number] Date of Birth/Sex: Treating RN: 09/11/1946 (77 y.o. [name] Primary Care Provider: [name] Other Clinician: Referring Provider: Treating Provider/Extender: [name] in Treatment: 13 Constitutional Well-nourished and well-hydrated in no acute distress. Respiratory normal breathing without difficulty. Psychiatric this patient is able to make decisions and demonstrates good insight into disease process. Alert and Oriented x 3. pleasant and  cooperative. Notes Upon inspection patient's wound bed actually showed signs of having some necrotic debris still although we are seeing more granulation starting to pick through and very pleased in this regard. I do think he could benefit from a snap VAC which would likely help to get this to granulate in faster he is in agreement with this plan. Electronic Signature(s) Signed: 02/27/2023 1:01:35 PM By: [name] [name] Entered By: [name] on 02/27/2023 13:01:35 -------------------------------------------------------------------------------- Physician Orders Details Patient Name: Date of Service: [name], [name] 02/27/2023 12:00 PM Medical Record Number: [id number] Patient Account Number: [id number] Date of Birth/Sex: Treating RN: June 21, 1946 (77 y.o. [name] Primary Care Provider: [name] Other Clinician: Referring Provider: Treating Provider/Extender: [name] in Treatment: 13 Verbal / Phone Orders: No Diagnosis Coding ICD-10 Coding Code Description E10.621 Type 1 diabetes mellitus with foot ulcer I73.89 Other specified peripheral vascular diseases L89.623 Pressure ulcer of left heel, stage 3 L89.613 Pressure ulcer of right heel, stage 3 [name], [name] ([id number]) [id number] Page 4 of 8 [phone] Non-pressure chronic ulcer of buttock with fat layer exposed I25.10 Atherosclerotic heart disease of native coronary artery without angina pectoris I50.42 Chronic combined systolic (congestive) and diastolic (congestive) heart failure I10 Essential (primary) hypertension Follow-up Appointments Return Appointment in 1 week. Nurse Visit as needed Home Health DISCONTINUE Home Health for Wound Care. - Patient no longer  needs home health wound care Bathing/ Shower/ Hygiene May shower; gently cleanse wound with antibacterial soap, rinse and pat dry prior to dressing wounds No tub bath. - no soaking foot  in tub Anesthetic (Use 'Patient Medications' Section for Anesthetic Order Entry) Lidocaine applied to wound bed Edema Control - Lymphedema / Segmental Compressive Device / Other Elevate, Exercise Daily and A void Standing for Long Periods of Time. Elevate legs to the level of the heart and pump ankles as often as possible Elevate leg(s) parallel to the floor when sitting. DO YOUR BEST to sleep in the bed at night. DO NOT sleep in your recliner. Long hours of sitting in a recliner leads to swelling of the legs and/or potential wounds on your backside. Wound Treatment Wound #5 - Calcaneus Wound Laterality: Left Cleanser: Byram Ancillary Kit - 15 Day Supply (Generic) 1 x Per Day/30 Days Discharge Instructions: Use supplies as instructed; Kit contains: (15) Saline Bullets; (15) 3x3 Gauze; 15 pr Gloves Cleanser: Soap and Water 1 x Per Day/30 Days Discharge Instructions: Gently cleanse wound with antibacterial soap, rinse and pat dry prior to dressing wounds Prim Dressing: Hydrofera Blue Ready Transfer Foam, 2.5x2.5 (in/in) (Generic) 1 x Per Day/30 Days ary Discharge Instructions: Apply Hydrofera Blue Ready to wound bed as directed Please cut a small cirle to fit in wound bed and then place a square piece over top Secondary Dressing: (BORDER) Zetuvit Plus SILICONE BORDER Dressing 4x4 (in/in) (Generic) 1 x Per Day/30 Days Discharge Instructions: Please do not put silicone bordered dressings under wraps. Use non-bordered dressing only. Electronic Signature(s) Signed: 02/27/2023 5:10:54 PM By: Angelina Pih Signed: 02/28/2023 1:44:50 PM By: [name] [name] Entered By: Angelina Pih on 02/27/2023 12:41:51 -------------------------------------------------------------------------------- Problem List Details Patient Name: Date of Service: [name], [name] 02/27/2023 12:00 PM Medical Record Number: [id number] Patient Account Number: [id number] Date of Birth/Sex: Treating RN: Oct 28, 1946 (77  y.o. [name] Primary Care Provider: [name] Other Clinician: Referring Provider: Treating Provider/Extender: [name] in Treatment: 13 Active Problems ICD-10 Encounter Code Description Active Date MDM Diagnosis E10.621 Type 1 diabetes mellitus with foot ulcer 11/26/2022 No Yes I73.89 Other specified peripheral vascular diseases 11/26/2022 No Yes L89.623 Pressure ulcer of left heel, stage 3 11/26/2022 No Yes [name], [name] ([id number]) [id number] Page 5 of 8 L89.613 Pressure ulcer of right heel, stage 3 11/26/2022 No Yes L98.412 Non-pressure chronic ulcer of buttock with fat layer exposed 11/26/2022 No Yes I25.10 Atherosclerotic heart disease of native coronary artery without angina pectoris 11/26/2022 No Yes I50.42 Chronic combined systolic (congestive) and diastolic (congestive) heart failure 11/26/2022 No Yes I10 Essential (primary) hypertension 11/26/2022 No Yes Inactive Problems Resolved Problems Electronic Signature(s) Signed: 02/27/2023 12:26:41 PM By: [name] [name] Entered By: [name] on 02/27/2023 12:26:41 -------------------------------------------------------------------------------- Progress Note Details Patient Name: Date of Service: [name], [name] 02/27/2023 12:00 PM Medical Record Number: [id number] Patient Account Number: [id number] Date of Birth/Sex: Treating RN: October 02, 1946 (77 y.o. [name] Primary Care Provider: [name] Other Clinician: Referring Provider: Treating Provider/Extender: [name] in Treatment: 13 Subjective Chief Complaint Information obtained from Patient Left heel pressure ulcer History of Present Illness (HPI) 77 year old male recently seen by his PCP Dr. [name] who saw him for a wound on the right leg which is less tender and he is continuing to use bacitracin and has completed her course of doxycycline. Past  medical history of coronary artery disease, diabetes mellitus type 1, hyperlipidemia,  hypertension, MI, plantar fascial fibromatosis, pneumonia, status post foot surgery due to trauma on the left side, coronary angioplasty. he is a former smoker and quit in July 1996. last hemoglobin A1c was 7.8 % in April of this year 5/22 2017 -- biopsy of the right lower extremity was sent for pathology and the report is that of a basal cell carcinoma with edges of the biopsy are involved. I have called the patient over the phone( 03/28/16) and discussed the pathology report with him and he is agreeable to see a surgeon for excision and need full. I have also spoken personally to [name], of [facility] surgical and referred the patient for further wide excision of this lesion and have communicated this to the patient. READMISSION 08/12/18 This is a now 77 year old man with type 1 diabetes. Hemoglobin A1c on 4/29 was 7.9. He is listed as having a history of PAD in the [facility] records although the patient doesn't recall this, doesn't complain of any symptoms of claudication and doesn't believe he has had any prior noninvasive arterial test. He is a nonsmoker. He was here in 2017 with a wound on his Right lower leg. This was biopsied in the clinic and pathology showed a basal cell carcinoma. He went on to have surgery on this area this is currently where I believe his current wounds are located. He generally bumped the lateral part of his right calf 2 weeks ago and has a superficial abrasion. He also has 2 small open areas on the medial part of the tibia in the same area. Our intake nurse noted a stitch coming out of this which was removed. These of the wound sees actually come to the clinic for. However the more concerning area is an area on the tip of the left great toe. He says that this was initially traumatic and he's been to see [name]. [name] been using what I think is Santyl to the wound tip.  This has some depth. ABIs in this clinic were non-obtainable on the right and 1.84 on the left. 08/19/18; the patient had arterial studies that did not show evidence of significant hemodynamically compromising occlusions in either leg. There was mild medial atherosclerosis bilaterally. His resting ABIs were within the normal limits at 1.3 on the right and 1.4 on the left. There was normal posterior and anterior tibial artery waveforms and velocities listed on both sides. [name], [name] ([id number]) [id number] Page 6 of 8 The patient arrives with what appears to be a healthy surface over the tip of his left great toe. This is indeed surprising. Still looks somewhat friable however. More concerning is the x-ray that we did that showed erosions of the tuft of the distal phalanx the left great toe consistent with osteomyelitis. I attempted to pull this x-ray up on colon health Link however I can't open the film to look at this myself. 08/26/18; I reviewed the patient's x-ray and colon helpline. Indeed there is fairly obvious erosion of the tip of his left great toe. The wound was initially a traumatic area hitting it sometime in the middle of night in his kitchen. He is a type I diabetic. I have him on Flagyl and Levaquin that I started last week i.e. 7 days ago. He has an appointment with infectious disease on 09/01/18 09/02/18; the patient was seen by [name] of infectious disease. She did not feel the patient needed IV antibiotics. I do feel he needs further oral antibiotics however. He  does not need anymore Flagyl but he does need another 2 weeks of Levaquin. The areas just about closed. 09/16/18; the patient is completing his Levaquin today. There is no open wound on the tip of the toe Readmission: 11-26-2022 upon evaluation today patient presents for initial inspection here in our clinic concerning issues that he has been having after having  been hospitalized due to septic arthritis. Subsequently he has a PICC line that is been removed and he has been discharged from infectious disease in that regard. Unfortunately he developed shearing wounds over the gluteal region as well as a pressure ulcer over each heel although they are not looking extremely bad nonetheless they do seem to be evidence of pressure which she sustained while hospitalized. Obviously he was very sick and is still recovering as far as that is concerned. Fortunately there does not appear to be any evidence of active infection systemically which is good news at this point according to Dr. [name]. Patient's wounds also do not appear to be too bad at this point which is good news. Patient does have a history of type 1 diabetes mellitus, peripheral vascular disease, coronary artery disease, congestive heart failure, hypertension, and he is on long-term anticoagulant therapy. 12-03-2022 upon evaluation today patient appears to be doing better to some degree in regard to his ulcers on his heels at this point. We are still waiting on the actual appointment with the vascular surgeons but nonetheless in the meantime I do believe that he is making some good progress currently with regard to loosen up the eschar. I did actually feel like that there was some ability for [place] to remove some of the necrotic tissue today and I discussed that with the patient. He is in agreement with that plan. We also can probably see about making an adjustment in the treatment regimen. 12-10-2022 upon evaluation today patient's wounds were really doing about the same. Again he is still awaiting the evaluation with the vascular surgeon. That should have already been done but had to be pushed back unfortunately. He will be seeing them next week on the seventh. I plan to see him following somewhere around the end of the week I think will probably be best. The patient voiced understanding. 12-19-2022 upon  evaluation today patient appears to be doing well currently in regard to his wound we did get the results of his arterial studies which show that he has excellent ABIs with no signs of vascular compromise. For that reason I will go ahead and perform some debridement today to clearway the necrotic debris with his Eliquis that I will be too aggressive so this will still be a stepwise process but we are to go ahead and get that started. 12-27-2022 upon evaluation today patient appears to be doing well currently in regard to his wounds. They are actually showing signs of excellent improvement. There is some necrotic tissue noted on the surface of wounds to work on clearing some of that away today also I think that we may switch to [facility] dressing to see how things do going forward. He is in agreement with that plan. Subsequently I am extremely pleased with where we stand currently. No fevers, chills, nausea, vomiting, or diarrhea. 01-02-2023 upon evaluation today patient appears to be doing much better in regard to his wounds the right foot appears to be almost healed the left foot though not completely healed is significantly better. Fortunately there does not appear to be any signs of active  infection at this point. 01-10-2023 upon evaluation today patient appears to be doing well currently in regard to his wounds. The right heel actually is completely healed the left heel is definitely headed in the right direction and looks to be doing awesome. I do not see any signs of active infection locally nor systemically at this time which is great news. 3/7; his right heel has remained healed. There is no evidence of active infection. The remaining wound is on the lateral aspect of the heel just above the plantar surface 01-30-2023 upon evaluation today patient appears to be doing well currently in regard to his wound. The heel actually showing signs of improvement which is great news and fortunately I do  not see any evidence of active infection locally nor systemically at this time. Overall I think that we are headed in the right direction which is excellent here. No fevers, chills, nausea, vomiting, or diarrhea. I am good have to perform some sharp debridement today. This is just the left heel that is remaining open. 02-06-2023 upon evaluation today patient appears to be doing well currently in regard to his wound. Has been tolerating the dressing changes without complication. Fortunately there does not appear to be any signs of active infection locally nor systemically although he does have some need for sharp debridement with some definite necrotic tissue at the base of the wound today. 02-13-2023 upon evaluation today patient appears to be doing well currently in regard to his wound which I feel like is slowly clearing up. I did review his growth labs as well the sed rate and C-reactive protein are down his white blood cell count was normal his hemoglobin is coming up. He did see Dr. Joylene Draft on Tuesday and she did continue him on the doxycycline considering his lab numbers but nonetheless he seems to be making some pretty good progress here in my opinion. The wound is slowly turnaround serial debridements are obviously helping with that being said I do believe that he is going to still require dressing changes and close monitoring. 02-20-2023 upon evaluation today patient's wound actually appears to be making some slight progress. This is slowly but surely moving into the right direction. Fortunately I do not see any signs of active infection at this time. 02-27-2023 upon evaluation today patient appears to be doing well currently in regard to his wound. He has been tolerating the dressing changes. I think the Hydrofera Blue is doing a good job and he seems to be doing well with that currently. Objective Constitutional Well-nourished and well-hydrated in no acute distress. Vitals Time Taken:  12:05 PM, Height: 69 in, Weight: 190 lbs, BMI: 28.1, Temperature: 97.6 F, Pulse: 89 bpm, Respiratory Rate: 18 breaths/min, Blood Pressure: 115/68 mmHg. Respiratory normal breathing without difficulty. [name], [name] ([id number]) [id number] Page 7 of 8 Psychiatric this patient is able to make decisions and demonstrates good insight into disease process. Alert and Oriented x 3. pleasant and cooperative. General Notes: Upon inspection patient's wound bed actually showed signs of having some necrotic debris still although we are seeing more granulation starting to pick through and very pleased in this regard. I do think he could benefit from a snap VAC which would likely help to get this to granulate in faster he is in agreement with this plan. Integumentary (Hair, Skin) Wound #5 status is Open. Original cause of wound was Pressure Injury. The date acquired was: 10/20/2022. The wound has been in treatment 13 weeks. The wound is located  on the Left Calcaneus. The wound measures 1.7cm length x 1.3cm width x 0.9cm depth; 1.736cm^2 area and 1.562cm^3 volume. There is Fat Layer (Subcutaneous Tissue) exposed. There is no tunneling or undermining noted. There is a medium amount of serosanguineous drainage noted. There is small (1-33%) red, pink granulation within the wound bed. There is a large (67-100%) amount of necrotic tissue within the wound bed including Adherent Slough. Assessment Active Problems ICD-10 Type 1 diabetes mellitus with foot ulcer Other specified peripheral vascular diseases Pressure ulcer of left heel, stage 3 Pressure ulcer of right heel, stage 3 Non-pressure chronic ulcer of buttock with fat layer exposed Atherosclerotic heart disease of native coronary artery without angina pectoris Chronic combined systolic (congestive) and diastolic (congestive) heart failure Essential (primary) hypertension Procedures Wound #5 Pre-procedure diagnosis of  Wound #5 is a Pressure Ulcer located on the Left Calcaneus . There was a Excisional Skin/Subcutaneous Tissue Debridement with a total area of 2.21 sq cm performed by [name], [name]. With the following instrument(s): Curette to remove Viable and Non-Viable tissue/material. Material removed includes Subcutaneous Tissue and Slough and after achieving pain control using Lidocaine 4% T opical Solution. No specimens were taken. A time out was conducted at 12:31, prior to the start of the procedure. A Moderate amount of bleeding was controlled with Pressure. The procedure was tolerated well. Post Debridement Measurements: 1.7cm length x 1.3cm width x 0.1cm depth; 0.174cm^3 volume. Post debridement Stage noted as Category/Stage III. Character of Wound/Ulcer Post Debridement is stable. Post procedure Diagnosis Wound #5: Same as Pre-Procedure Plan Follow-up Appointments: Return Appointment in 1 week. Nurse Visit as needed Home Health: DISCONTINUE Home Health for Wound Care. - Patient no longer needs home health wound care Bathing/ Shower/ Hygiene: May shower; gently cleanse wound with antibacterial soap, rinse and pat dry prior to dressing wounds No tub bath. - no soaking foot in tub Anesthetic (Use 'Patient Medications' Section for Anesthetic Order Entry): Lidocaine applied to wound bed Edema Control - Lymphedema / Segmental Compressive Device / Other: Elevate, Exercise Daily and Avoid Standing for Long Periods of Time. Elevate legs to the level of the heart and pump ankles as often as possible Elevate leg(s) parallel to the floor when sitting. DO YOUR BEST to sleep in the bed at night. DO NOT sleep in your recliner. Long hours of sitting in a recliner leads to swelling of the legs and/or potential wounds on your backside. WOUND #5: - Calcaneus Wound Laterality: Left Cleanser: [employer] Ancillary Kit - 15 Day Supply (Generic) 1 x Per Day/30 Days Discharge Instructions: Use supplies as instructed; Kit  contains: (15) Saline Bullets; (15) 3x3 Gauze; 15 pr Gloves Cleanser: Soap and Water 1 x Per Day/30 Days Discharge Instructions: Gently cleanse wound with antibacterial soap, rinse and pat dry prior to dressing wounds Prim Dressing: Hydrofera Blue Ready Transfer Foam, 2.5x2.5 (in/in) (Generic) 1 x Per Day/30 Days ary Discharge Instructions: Apply Hydrofera Blue Ready to wound bed as directed Please cut a small cirle to fit in wound bed and then place a square piece over top Secondary Dressing: (BORDER) Zetuvit Plus SILICONE BORDER Dressing 4x4 (in/in) (Generic) 1 x Per Day/30 Days Discharge Instructions: Please do not put silicone bordered dressings under wraps. Use non-bordered dressing only. 1. Based on what I am seeing I do believe that we are moving in the right direction. I am actually hopeful that he will continue to show signs of improvement going forward. The patient is in agreement with this plan as well. 2.  I am going to recommend as well that we have him continue to utilize the [facility] which I think is doing a decent job here. We are seeing some ISA, KOHLENBERG ([id number]) [id number] Page 8 of 8 additional granulation which is great news. 3. I am also can recommend patient should continue with the bordered foam dressing to cover. 4. Will also need to continue with his current shoes. I am good to look into a snap VAC will probably need a bridge kit for this if we do the snap VAC due to the fact that this is, on the bottom of his foot the patient is in agreement with that plan will see what his insurance says. We will see patient back for reevaluation in 1 week here in the clinic. If anything worsens or changes patient will contact our office for additional recommendations. Electronic Signature(s) Signed: 02/27/2023 1:04:03 PM By: [name] [name] Entered By: [name] on 02/27/2023  13:04:03 -------------------------------------------------------------------------------- SuperBill Details Patient Name: Date of Service: [name], [name] 02/27/2023 Medical Record Number: [id number] Patient Account Number: [id number] Date of Birth/Sex: Treating RN: September 03, 1946 (77 y.o. [name] Primary Care Provider: [name] Other Clinician: Referring Provider: Treating Provider/Extender: [name] in Treatment: 13 Diagnosis Coding ICD-10 Codes Code Description E10.621 Type 1 diabetes mellitus with foot ulcer I73.89 Other specified peripheral vascular diseases L89.623 Pressure ulcer of left heel, stage 3 L89.613 Pressure ulcer of right heel, stage 3 L98.412 Non-pressure chronic ulcer of buttock with fat layer exposed I25.10 Atherosclerotic heart disease of native coronary artery without angina pectoris I50.42 Chronic combined systolic (congestive) and diastolic (congestive) heart failure I10 Essential (primary) hypertension Facility Procedures : CPT4 Code: [id number] Description: 11042 - DEB SUBQ TISSUE 20 SQ CM/< ICD-10 Diagnosis Description L89.623 Pressure ulcer of left heel, stage 3 Modifier: Quantity: 1 Physician Procedures : CPT4 Code Description Modifier [id number] 11042 - WC PHYS SUBQ TISS 20 SQ CM ICD-10 Diagnosis Description L89.623 Pressure ulcer of left heel, stage 3 Quantity: 1 Electronic Signature(s) Signed: 02/27/2023 5:05:25 PM By: Angelina Pih Signed: 02/28/2023 1:44:50 PM By: [name] [name] Previous Signature: 02/27/2023 1:04:17 PM Version By: [name] [name] Entered By: Angelina Pih on 02/27/2023 17:05:24

## 2023-02-28 ENCOUNTER — Encounter (INDEPENDENT_AMBULATORY_CARE_PROVIDER_SITE_OTHER): Payer: Medicare HMO | Admitting: Nurse Practitioner

## 2023-02-28 ENCOUNTER — Encounter (INDEPENDENT_AMBULATORY_CARE_PROVIDER_SITE_OTHER): Payer: Medicare HMO

## 2023-02-28 NOTE — Progress Notes (Signed)
KENNETT, SYMES ([id number]) [id number] Page 1 of 7 Visit Report for 02/27/2023 Arrival Information Details Patient Name: Date of Service: [name], [name] 02/27/2023 12:00 PM Medical Record Number: [id number] Patient Account Number: [id number] Date of Birth/Sex: Treating RN: [birth year] (77 y.o. [name] Primary Care [name]: [name] Other Clinician: Referring [name]: Treating [name]/Extender: [name] in Treatment: 13 Visit Information History Since Last Visit Added or deleted any medications: No Patient Arrived: [name] Any new allergies or adverse reactions: No Arrival Time: 12:01 Had a fall or experienced change in No Accompanied By: partner activities of daily living that may affect Transfer Assistance: None risk of falls: Patient Identification Verified: Yes Hospitalized since last visit: No Secondary Verification Process Completed: Yes Has Dressing in Place as Prescribed: Yes Patient Requires Transmission-Based Precautions: No Pain Present Now: No Patient Has Alerts: Yes Patient Alerts: Type I Diabetic eliquis ABI/TBI normal see scan Electronic Signature(s) Signed: 02/27/2023 5:10:54 PM By: Angelina Pih Entered By: Angelina Pih on 02/27/2023 12:04:59 -------------------------------------------------------------------------------- Clinic Level of Care Assessment Details Patient Name: Date of Service: [name], [name] 02/27/2023 12:00 PM Medical Record Number: [id number] Patient Account Number: [id number] Date of Birth/Sex: Treating RN: Sep 15, 1946 (77 y.o. [name] Primary Care [name]: [name] Other Clinician: Referring [name]: Treating [name]/Extender: [name] in Treatment: 13 Clinic Level of Care Assessment Items TOOL 1 Quantity Score []  - 0 Use when EandM and Procedure is performed on INITIAL visit ASSESSMENTS - Nursing  Assessment / Reassessment []  - 0 General Physical Exam (combine w/ comprehensive assessment (listed just below) when performed on new pt. evals) []  - 0 Comprehensive Assessment (HX, ROS, Risk Assessments, Wounds Hx, etc.) ASSESSMENTS - Wound and Skin Assessment / Reassessment []  - 0 Dermatologic / Skin Assessment (not related to wound area) ASSESSMENTS - Ostomy and/or Continence Assessment and Care []  - 0 Incontinence Assessment and Management []  - 0 Ostomy Care Assessment and Management (repouching, etc.) PROCESS - Coordination of Care []  - 0 Simple Patient / Family Education for ongoing care []  - 0 Complex (extensive) Patient / Family Education for ongoing care []  - 0 Staff obtains Chiropractor, Records, T Results / Process Orders est []  - 0 Staff telephones HHA, Nursing Homes / Clarify orders / etc []  - 0 Routine Transfer to another Facility (non-emergent condition) []  - 0 Routine Hospital Admission (non-emergent condition) []  - 0 New Admissions / Insurance Authorizations / Ordering NPWT Apligraf, etc. , []  - 0 Emergency Hospital Admission (emergent condition) [name], [name] ([id number]) [id number] Page 2 of 7 PROCESS - Special Needs []  - 0 Pediatric / Minor Patient Management []  - 0 Isolation Patient Management []  - 0 Hearing / Language / Visual special needs []  - 0 Assessment of Community assistance (transportation, D/C planning, etc.) []  - 0 Additional assistance / Altered mentation []  - 0 Support Surface(s) Assessment (bed, cushion, seat, etc.) INTERVENTIONS - Miscellaneous []  - 0 External ear exam []  - 0 Patient Transfer (multiple staff / Nurse, adult / Similar devices) []  - 0 Simple Staple / Suture removal (25 or less) []  - 0 Complex Staple / Suture removal (26 or more) []  - 0 Hypo/Hyperglycemic Management (do not check if billed separately) []  - 0 Ankle / Brachial Index (ABI) - do not check if billed separately Has the  patient been seen at the hospital within the last three years: Yes Total Score: 0 Level Of Care: ____ Electronic Signature(s) Signed: 02/27/2023 5:10:54 PM By: Roger Shelter,  Caitlin Entered By: Angelina Pih on 02/27/2023 17:05:16 -------------------------------------------------------------------------------- Encounter Discharge Information Details Patient Name: Date of Service: [name], [name] 02/27/2023 12:00 PM Medical Record Number: [id number] Patient Account Number: [id number] Date of Birth/Sex: Treating RN: 1946/10/26 (77 y.o. [name] Primary Care [name]: [name] Other Clinician: Referring [name]: Treating [name]/Extender: [name] in Treatment: 13 Encounter Discharge Information Items Post Procedure Vitals Discharge Condition: Stable Temperature (F): 97.6 Ambulatory Status: Cane Pulse (bpm): 89 Discharge Destination: Home Respiratory Rate (breaths/min): 18 Transportation: Private Auto Blood Pressure (mmHg): 115/68 Accompanied By: spouse Schedule Follow-up Appointment: Yes Clinical Summary of Care: Electronic Signature(s) Signed: 02/27/2023 5:07:07 PM By: Angelina Pih Entered By: Angelina Pih on 02/27/2023 17:07:07 -------------------------------------------------------------------------------- Lower Extremity Assessment Details Patient Name: Date of Service: [name], [name] 02/27/2023 12:00 PM Medical Record Number: [id number] Patient Account Number: [id number] Date of Birth/Sex: Treating RN: 1946-03-02 (77 y.o. [name] Primary Care [name]: [name] Other Clinician: Referring [name]: Treating [name]/Extender: [name] in Treatment: 13 Edema Assessment Assessed: [name]: No] [Right: No] Edema: [Left: Ye] [Right: s] P[Left: ORTERFIELD, [name] ([id number])] [Right: [id number] Page 3 of 7] Calf Left: Right: Point of Measurement: 34  cm From Medial Instep 41 cm Ankle Left: Right: Point of Measurement: 11 cm From Medial Instep 29.6 cm Vascular Assessment Pulses: Dorsalis Pedis Doppler Audible: [Left:Yes] Posterior Tibial Doppler Audible: [Left:Yes] Electronic Signature(s) Signed: 02/27/2023 5:10:54 PM By: Angelina Pih Entered By: Angelina Pih on 02/27/2023 12:18:04 -------------------------------------------------------------------------------- Multi Wound Chart Details Patient Name: Date of Service: [name], [name] 02/27/2023 12:00 PM Medical Record Number: [id number] Patient Account Number: [id number] Date of Birth/Sex: Treating RN: 1946-09-22 (77 y.o. [name] Primary Care Danaysha Kirn: [name] Other Clinician: Referring Natalee Tomkiewicz: Treating Todrick Siedschlag/Extender: [name] in Treatment: 13 Vital Signs Height(in): 69 Pulse(bpm): 89 Weight(lbs): 190 Blood Pressure(mmHg): 115/68 Body Mass Index(BMI): 28.1 Temperature(F): 97.6 Respiratory Rate(breaths/min): 18 [5:Photos:] [N/A:N/A] Left Calcaneus N/A N/A Wound Location: Pressure Injury N/A N/A Wounding Event: Pressure Ulcer N/A N/A Primary Etiology: Coronary Artery Disease, N/A N/A Comorbid History: Hypertension, Type I Diabetes, Osteoarthritis, Received Chemotherapy 10/20/2022 N/A N/A Date Acquired: 13 N/A N/A Weeks of Treatment: Open N/A N/A Wound Status: No N/A N/A Wound Recurrence: 1.7x1.3x0.9 N/A N/A Measurements L x W x D (cm) 1.736 N/A N/A A (cm) : rea 1.562 N/A N/A Volume (cm) : 63.20% N/A N/A % Reduction in A rea: -231.60% N/A N/A % Reduction in Volume: Category/Stage III N/A N/A Classification: Medium N/A N/A Exudate A mount: Serosanguineous N/A N/A Exudate Type: red, brown N/A N/A Exudate Color: Small (1-33%) N/A N/A Granulation A mount: Red, Pink N/A N/A Granulation Quality: Large (67-100%) N/A N/A Necrotic A mount: Fat Layer (Subcutaneous Tissue): Yes N/A  N/A Exposed Structures: Fascia: No [name], [name] ([id number]) [id number] Page 4 of 7 Tendon: No Muscle: No Joint: No Bone: No Small (1-33%) N/A N/A Epithelialization: Debridement - Excisional N/A N/A Debridement: Pre-procedure Verification/Time Out 12:31 N/A N/A Taken: Lidocaine 4% Topical Solution N/A N/A Pain Control: Subcutaneous, Slough N/A N/A Tissue Debrided: Skin/Subcutaneous Tissue N/A N/A Level: 2.21 N/A N/A Debridement A (sq cm): rea Curette N/A N/A Instrument: Moderate N/A N/A Bleeding: Pressure N/A N/A Hemostasis A chieved: Procedure was tolerated well N/A N/A Debridement Treatment Response: 1.7x1.3x0.1 N/A N/A Post Debridement Measurements L x W x D (cm) 0.174 N/A N/A Post Debridement Volume: (cm) Category/Stage III N/A N/A Post Debridement Stage: Debridement N/A N/A Procedures Performed: Treatment Notes  Electronic Signature(s) Signed: 02/27/2023 5:10:54 PM By: Angelina Pih Entered By: Angelina Pih on 02/27/2023 12:32:29 -------------------------------------------------------------------------------- Multi-Disciplinary Care Plan Details Patient Name: Date of Service: [name], [name] 02/27/2023 12:00 PM Medical Record Number: [id number] Patient Account Number: [id number] Date of Birth/Sex: Treating RN: [birth year] (77 y.o. [name] Primary Care Davielle Lingelbach: [name] Other Clinician: Referring Sarha Bartelt: Treating Siera Beyersdorf/Extender: [name] in Treatment: 13 Active Inactive Pressure Nursing Diagnoses: Potential for impaired tissue integrity related to pressure, friction, moisture, and shear Goals: Patient will remain free of pressure ulcers Date Initiated: 11/26/2022 Target Resolution Date: 03/11/2023 Goal Status: Active Interventions: Assess: immobility, friction, shearing, incontinence upon admission and as needed Assess offloading mechanisms upon admission and  as needed Assess potential for pressure ulcer upon admission and as needed Notes: Wound/Skin Impairment Nursing Diagnoses: Knowledge deficit related to ulceration/compromised skin integrity Goals: Patient/caregiver will verbalize understanding of skin care regimen Date Initiated: 11/26/2022 Date Inactivated: 01/16/2023 Target Resolution Date: 12/03/2022 Goal Status: Met Ulcer/skin breakdown will have a volume reduction of 30% by week 4 Date Initiated: 11/26/2022 Date Inactivated: 02/13/2023 Target Resolution Date: 12/24/2022 Goal Status: Met Ulcer/skin breakdown will have a volume reduction of 50% by week 8 Date Initiated: 11/26/2022 Date Inactivated: 02/13/2023 Target Resolution Date: 01/21/2023 Goal Status: Unmet Unmet Reason: underlying conditions Ulcer/skin breakdown will have a volume reduction of 80% by week 12 [name], [name] ([id number]) [id number] Page 5 of 7 Date Initiated: 11/26/2022 Date Inactivated: 02/27/2023 Target Resolution Date: 02/27/2023 Goal Status: Unmet Unmet Reason: underlying condition Ulcer/skin breakdown will heal within 14 weeks Date Initiated: 11/26/2022 Target Resolution Date: 03/18/2023 Goal Status: Active Interventions: Assess patient/caregiver ability to obtain necessary supplies Assess patient/caregiver ability to perform ulcer/skin care regimen upon admission and as needed Assess ulceration(s) every visit Notes: week 4 30% healing met for right calcaneus but not met for left calcaneus. Electronic Signature(s) Signed: 02/27/2023 5:05:54 PM By: Angelina Pih Entered By: Angelina Pih on 02/27/2023 17:05:54 -------------------------------------------------------------------------------- Pain Assessment Details Patient Name: Date of Service: [name], [name] 02/27/2023 12:00 PM Medical Record Number: [id number] Patient Account Number: [id number] Date of Birth/Sex: Treating RN: 10/13/1946 (77 y.o. [name] Primary Care Quinlin Conant: [name] Other Clinician: Referring Avary Eichenberger: Treating Deshanta Lady/Extender: [name] in Treatment: 13 Active Problems Location of Pain Severity and Description of Pain Patient Has Paino No Site Locations Rate the pain. Current Pain Level: 0 Pain Management and Medication Current Pain Management: Electronic Signature(s) Signed: 02/27/2023 5:10:54 PM By: Angelina Pih Entered By: Angelina Pih on 02/27/2023 12:07:50 -------------------------------------------------------------------------------- Patient/Caregiver Education Details Patient Name: Date of Service: [name], [name] Reine Just 4/18/2024andnbsp12:00 PM Medical Record Number: [id number] Patient Account Number: [id number] Date of Birth/Gender: Treating RN: 1946/04/12 (77 y.o. [name] Primary Care Physician: [name] Other Clinician: Referring Physician: Treating Physician/Extender: [name] in Treatment: [address] [name], [name] ([id number]) [id number] Page 6 of 7 Education Provided To: Patient Education Topics Provided Wound Debridement: Handouts: Wound Debridement Methods: Explain/Verbal Responses: State content correctly Wound/Skin Impairment: Handouts: Caring for Your Ulcer Methods: Explain/Verbal Responses: State content correctly Electronic Signature(s) Signed: 02/27/2023 5:10:54 PM By: Angelina Pih Entered By: Angelina Pih on 02/27/2023 17:06:19 -------------------------------------------------------------------------------- Wound Assessment Details Patient Name: Date of Service: [name], [name] 02/27/2023 12:00 PM Medical Record Number: [id number] Patient Account Number: [id number] Date of Birth/Sex: Treating RN: [birth year] (77 y.o. [name] Primary Care Dinnis Rog: [name] Other Clinician: Referring Bethel Sirois: Treating  Rimsha Trembley/Extender: [name]  [name] Weeks in Treatment: 13 Wound Status Wound Number: 5 Primary Pressure Ulcer Etiology: Wound Location: Left Calcaneus Wound Open Wounding Event: Pressure Injury Status: Date Acquired: 10/20/2022 Comorbid Coronary Artery Disease, Hypertension, Type I Diabetes, Weeks Of Treatment: 13 History: Osteoarthritis, Received Chemotherapy Clustered Wound: No Photos Wound Measurements Length: (cm) 1.7 Width: (cm) 1.3 Depth: (cm) 0.9 Area: (cm) 1.736 Volume: (cm) 1.562 % Reduction in Area: 63.2% % Reduction in Volume: -231.6% Epithelialization: Small (1-33%) Tunneling: No Undermining: No Wound Description Classification: Category/Stage III Exudate Amount: Medium Exudate Type: Serosanguineous Exudate Color: red, brown Foul Odor After Cleansing: No Slough/Fibrino Yes Wound Bed Granulation Amount: Small (1-33%) Exposed Structure Granulation Quality: Red, Pink Fascia Exposed: No Necrotic Amount: Large (67-100%) Fat Layer (Subcutaneous Tissue) Exposed: Yes [name], [name] ([id number]) [id number] Page 7 of 7 Necrotic Quality: Adherent Slough Tendon Exposed: No Muscle Exposed: No Joint Exposed: No Bone Exposed: No Treatment Notes Wound #5 (Calcaneus) Wound Laterality: Left Cleanser Byram Ancillary Kit - 15 Day Supply Discharge Instruction: Use supplies as instructed; Kit contains: (15) Saline Bullets; (15) 3x3 Gauze; 15 pr Gloves Soap and Water Discharge Instruction: Gently cleanse wound with antibacterial soap, rinse and pat dry prior to dressing wounds Peri-Wound Care Topical Primary Dressing Hydrofera [name] Ready Transfer Foam, 2.5x2.5 (in/in) Discharge Instruction: Apply Hydrofera [name] Ready to wound bed as directed Please cut a small cirle to fit in wound bed and then place a square piece over top Secondary Dressing (BORDER) Zetuvit Plus SILICONE BORDER Dressing 4x4 (in/in) Discharge  Instruction: Please do not put silicone bordered dressings under wraps. Use non-bordered dressing only. Secured With Compression Wrap Compression Stockings Facilities manager) Signed: 02/27/2023 5:10:54 PM By: Angelina Pih Entered By: Angelina Pih on 02/27/2023 12:17:38 -------------------------------------------------------------------------------- Vitals Details Patient Name: Date of Service: [name], [name] 02/27/2023 12:00 PM Medical Record Number: [id number] Patient Account Number: [id number] Date of Birth/Sex: Treating RN: 10-10-1946 (77 y.o. [name] Primary Care Lucero Auzenne: [name] Other Clinician: Referring Breonia Kirstein: Treating Lexee Brashears/Extender: [name] in Treatment: 13 Vital Signs Time Taken: 12:05 Temperature (F): 97.6 Height (in): 69 Pulse (bpm): 89 Weight (lbs): 190 Respiratory Rate (breaths/min): 18 Body Mass Index (BMI): 28.1 Blood Pressure (mmHg): 115/68 Reference Range: 80 - 120 mg / dl Electronic Signature(s) Signed: 02/27/2023 5:10:54 PM By: Angelina Pih Entered By: Angelina Pih on 02/27/2023 12:07:31

## 2023-03-06 ENCOUNTER — Encounter: Payer: Medicare HMO | Admitting: Physician Assistant

## 2023-03-06 DIAGNOSIS — E10621 Type 1 diabetes mellitus with foot ulcer: Secondary | ICD-10-CM | POA: Diagnosis not present

## 2023-03-06 DIAGNOSIS — E1051 Type 1 diabetes mellitus with diabetic peripheral angiopathy without gangrene: Secondary | ICD-10-CM | POA: Diagnosis not present

## 2023-03-06 DIAGNOSIS — L89623 Pressure ulcer of left heel, stage 3: Secondary | ICD-10-CM | POA: Diagnosis not present

## 2023-03-06 DIAGNOSIS — E785 Hyperlipidemia, unspecified: Secondary | ICD-10-CM | POA: Diagnosis not present

## 2023-03-06 DIAGNOSIS — L89613 Pressure ulcer of right heel, stage 3: Secondary | ICD-10-CM | POA: Diagnosis not present

## 2023-03-06 DIAGNOSIS — I110 Hypertensive heart disease with heart failure: Secondary | ICD-10-CM | POA: Diagnosis not present

## 2023-03-06 DIAGNOSIS — L98412 Non-pressure chronic ulcer of buttock with fat layer exposed: Secondary | ICD-10-CM | POA: Diagnosis not present

## 2023-03-06 DIAGNOSIS — E10622 Type 1 diabetes mellitus with other skin ulcer: Secondary | ICD-10-CM | POA: Diagnosis not present

## 2023-03-06 DIAGNOSIS — I5042 Chronic combined systolic (congestive) and diastolic (congestive) heart failure: Secondary | ICD-10-CM | POA: Diagnosis not present

## 2023-03-07 NOTE — Progress Notes (Addendum)
[name] ([id number]) [id number] Page 1 of 8 Visit Report for 03/06/2023 Chief Complaint Document Details Patient Name: Date of Service: [name] [name] 03/06/2023 3:00 PM Medical Record Number: [id number] Patient Account Number: [id number] Date of Birth/Sex: Treating RN: 12/09/1945 (77 y.o. [name] Primary Care Provider: Jerl [name] Other Clinician: Referring Provider: Treating Provider/Extender: [name]: 14 Information Obtained from: Patient Chief Complaint Left heel pressure ulcer Electronic Signature(s) Signed: 03/06/2023 3:18:05 PM By: [name] Derry [name] Entered By: [name] on 03/06/2023 15:18:05 -------------------------------------------------------------------------------- Debridement Details Patient Name: Date of Service: [name] [name] 03/06/2023 3:00 PM Medical Record Number: [id number] Patient Account Number: [id number] Date of Birth/Sex: Treating RN: [birth year] (77 y.o. [name] Primary Care Provider: Jerl [name] Other Clinician: Referring Provider: Treating Provider/Extender: [name]: 14 Debridement Performed for Assessment: Wound #5 Left Calcaneus Performed By: Physician [name] Derry, [name] Debridement Type: Debridement Level of Consciousness (Pre-procedure): Awake and Alert Pre-procedure Verification/Time Out Yes - 15:24 Taken: Percent of Wound Bed Debrided: 100% T Area Debrided (cm): otal 1.38 Tissue and other material debrided: Viable, Non-Viable, Slough, Subcutaneous, Slough Level: Skin/Subcutaneous Tissue Debridement Description: Excisional Instrument: Curette Bleeding: Moderate Hemostasis Achieved: Pressure Response to [name]: Procedure was tolerated well Level of Consciousness (Post- Awake and Alert procedure): Post Debridement Measurements of Total Wound Length: (cm) 1.6 Stage:  Category/Stage III Width: (cm) 1.1 Depth: (cm) 1.2 Volume: (cm) 1.659 Character of Wound/Ulcer Post Debridement: Stable Post Procedure Diagnosis Same as Pre-procedure Electronic Signature(s) Signed: 03/06/2023 4:36:23 PM By: [name] Signed: 03/06/2023 5:47:58 PM By: [name] Derry [name] Entered By: [name] on 03/06/2023 15:26:42 HPI Details -------------------------------------------------------------------------------- [name] ([id number]) [id number] Page 2 of 8 Patient Name: Date of Service: [name] [name] 03/06/2023 3:00 PM Medical Record Number: [id number] Patient Account Number: [id number] Date of Birth/Sex: Treating RN: [birth year] (77 y.o. [name] Primary Care Provider: Jerl [name] Other Clinician: Referring Provider: Treating Provider/Extender: [name]: 14 History of Present Illness HPI Description: 77 year old male recently seen by his PCP Dr. Mickel [name] who saw him for a wound on the right leg which is less tender and he is continuing to use bacitracin and has completed her course of doxycycline. Past medical history of coronary artery disease, diabetes mellitus type 1, hyperlipidemia, hypertension, MI, plantar fascial fibromatosis, pneumonia, status post foot surgery due to trauma on the left side, coronary angioplasty. he is a former smoker and quit in July 1996. last hemoglobin A1c was 7.8 % in April of this year 5/22 2017 -- biopsy of the right lower extremity was sent for pathology and the report is that of a basal cell carcinoma with edges of the biopsy are involved. I have called the patient over the phone( 03/28/16) and discussed the pathology report with him and he is agreeable to see a surgeon for excision and need full. I have also spoken personally to [name], of [facility] surgical and referred the patient for further wide excision of this lesion and have  communicated this to the patient. READMISSION 08/12/18 This is a now 76 year old man with type 1 diabetes. Hemoglobin A1c on 4/29 was 7.9. He is listed as having a history of PAD in the [place] records although the patient doesn't recall this, doesn't complain of any symptoms of claudication and doesn't believe he has had any prior noninvasive arterial test. He is a nonsmoker. He was here in  2017 with a wound on his Right lower leg. This was biopsied in the clinic and pathology showed a basal cell carcinoma. He went on to have surgery on this area this is currently where I believe his current wounds are located. He generally bumped the lateral part of his right calf 2 weeks ago and has a superficial abrasion. He also has 2 small open areas on the medial part of the tibia in the same area. Our intake nurse noted a stitch coming out of this which was removed. These of the wound sees actually come to the clinic for. However the more concerning area is an area on the tip of the left great toe. He says that this was initially traumatic and he's been to see [name] podiatrist. [name] been using what I think is Santyl to the wound tip. This has some depth. ABIs in this clinic were non-obtainable on the right and 1.84 on the left. 08/19/18; the patient had arterial studies that did not show evidence of significant hemodynamically compromising occlusions in either leg. There was mild medial atherosclerosis bilaterally. His resting ABIs were within the normal limits at 1.3 on the right and 1.4 on the left. There was normal posterior and anterior tibial artery waveforms and velocities listed on both sides. The patient arrives with what appears to be a healthy surface over the tip of his left great toe. This is indeed surprising. Still looks somewhat friable however. More concerning is the x-ray that we did that showed erosions of the tuft of the distal phalanx the left great toe consistent with  osteomyelitis. I attempted to pull this x-ray up on colon health Link however I can't open the film to look at this myself. 08/26/18; I reviewed the patient's x-ray and colon helpline. Indeed there is fairly obvious erosion of the tip of his left great toe. The wound was initially a traumatic area hitting it sometime in the middle of night in his kitchen. He is a type I diabetic. I have him on Flagyl and Levaquin that I started last week i.e. 7 days ago. He has an appointment with infectious disease on 09/01/18 09/02/18; the patient was seen by [name] of infectious disease. She did not feel the patient needed IV antibiotics. I do feel he needs further oral antibiotics however. He does not need anymore Flagyl but he does need another 2 weeks of Levaquin. The areas just about closed. 09/16/18; the patient is completing his Levaquin today. There is no open wound on the tip of the toe Readmission: 11-26-2022 upon evaluation today patient presents for initial inspection here in our clinic concerning issues that he has been having after having been hospitalized due to septic arthritis. Subsequently he has a PICC line that is been removed and he has been discharged from infectious disease in that regard. Unfortunately he developed shearing wounds over the gluteal region as well as a pressure ulcer over each heel although they are not looking extremely bad nonetheless they do seem to be evidence of pressure which she sustained while hospitalized. Obviously he was very sick and is still recovering as far as that is concerned. Fortunately there does not appear to be any evidence of active infection systemically which is good news at this point according to Dr. [name]. Patient's wounds also do not appear to be too bad at this point which is good news. Patient does have a history of type 1 diabetes mellitus, peripheral vascular disease, coronary artery disease, congestive heart  failure, hypertension,  and he is on long-term anticoagulant therapy. 12-03-2022 upon evaluation today patient appears to be doing better to some degree in regard to his ulcers on his heels at this point. We are still waiting on the actual appointment with the vascular surgeons but nonetheless in the meantime I do believe that he is making some good progress currently with regard to loosen up the eschar. I did actually feel like that there was some ability for [place] to remove some of the necrotic tissue today and I discussed that with the patient. He is in agreement with that plan. We also can probably see about making an adjustment in the [name] regimen. 12-10-2022 upon evaluation today patient's wounds were really doing about the same. Again he is still awaiting the evaluation with the vascular surgeon. That should have already been done but had to be pushed back unfortunately. He will be seeing them next week on the seventh. I plan to see him following somewhere around the end of the week I think will probably be best. The patient voiced understanding. 12-19-2022 upon evaluation today patient appears to be doing well currently in regard to his wound we did get the results of his arterial studies which show that he has excellent ABIs with no signs of vascular compromise. For that reason I will go ahead and perform some debridement today to clearway the necrotic debris with his Eliquis that I will be too aggressive so this will still be a stepwise process but we are to go ahead and get that started. 12-27-2022 upon evaluation today patient appears to be doing well currently in regard to his wounds. They are actually showing signs of excellent improvement. There is some necrotic tissue noted on the surface of wounds to work on clearing some of that away today also I think that we may switch to [facility] dressing to see how things do going forward. He is in agreement with that plan. Subsequently I am extremely pleased with  where we stand currently. No fevers, chills, nausea, vomiting, or diarrhea. 01-02-2023 upon evaluation today patient appears to be doing much better in regard to his wounds the right foot appears to be almost healed the left foot though not completely healed is significantly better. Fortunately there does not appear to be any signs of active infection at this point. 01-10-2023 upon evaluation today patient appears to be doing well currently in regard to his wounds. The right heel actually is completely healed the left heel is definitely headed in the right direction and looks to be doing awesome. I do not see any signs of active infection locally nor systemically at this time which is great news. 3/7; his right heel has remained healed. There is no evidence of active infection. The remaining wound is on the lateral aspect of the heel just above the plantar surface [name], [name] ([id number]) [phone].pdf Page 3 of 8 01-30-2023 upon evaluation today patient appears to be doing well currently in regard to his wound. The heel actually showing signs of improvement which is great news and fortunately I do not see any evidence of active infection locally nor systemically at this time. Overall I think that we are headed in the right direction which is excellent here. No fevers, chills, nausea, vomiting, or diarrhea. I am good have to perform some sharp debridement today. This is just the left heel that is remaining open. 02-06-2023 upon evaluation today patient appears to be doing well currently in regard to  his wound. Has been tolerating the dressing changes without complication. Fortunately there does not appear to be any signs of active infection locally nor systemically although he does have some need for sharp debridement with some definite necrotic tissue at the base of the wound today. 02-13-2023 upon evaluation today patient appears to be doing well currently in regard to  his wound which I feel like is slowly clearing up. I did review his growth labs as well the sed rate and C-reactive protein are down his white blood cell count was normal his hemoglobin is coming up. He did see Dr. Joylene Draft on Tuesday and she did continue him on the doxycycline considering his lab numbers but nonetheless he seems to be making some pretty good progress here in my opinion. The wound is slowly turnaround serial debridements are obviously helping with that being said I do believe that he is going to still require dressing changes and close monitoring. 02-20-2023 upon evaluation today patient's wound actually appears to be making some slight progress. This is slowly but surely moving into the right direction. Fortunately I do not see any signs of active infection at this time. 02-27-2023 upon evaluation today patient appears to be doing well currently in regard to his wound. He has been tolerating the dressing changes. I think the Hydrofera Blue is doing a good job and he seems to be doing well with that currently. 03-06-2023 upon evaluation today patient appears to be doing well currently in regard to his wounds were actually seeing signs of improvement which is great news. Fortunately I do not see any evidence of active infection locally nor systemically which is great news and overall I do believe that we are moving in the right direction. Electronic Signature(s) Signed: 03/06/2023 4:19:56 PM By: [name] Derry [name] Entered By: [name] on 03/06/2023 16:19:55 -------------------------------------------------------------------------------- Physical Exam Details Patient Name: Date of Service: [name] [name] 03/06/2023 3:00 PM Medical Record Number: [id number] Patient Account Number: [id number] Date of Birth/Sex: Treating RN: Jun 29, 1946 (77 y.o. [name] Primary Care Provider: Jerl [name] Other Clinician: Referring Provider: Treating Provider/Extender: [name]: 14 Constitutional Well-nourished and well-hydrated in no acute distress. Respiratory normal breathing without difficulty. Psychiatric this patient is able to make decisions and demonstrates good insight into disease process. Alert and Oriented x 3. pleasant and cooperative. Notes Upon inspection patient's wound bed actually showed signs of good granulation epithelization at this point. Fortunately there does not appear to be any signs of active infection locally nor systemically which is great news and overall I am extremely pleased with where we stand at this point. Electronic Signature(s) Signed: 03/06/2023 4:20:37 PM By: [name] Derry [name] Entered By: [name] on 03/06/2023 16:20:37 -------------------------------------------------------------------------------- Physician Orders Details Patient Name: Date of Service: [name] [name] 03/06/2023 3:00 PM Medical Record Number: [id number] Patient Account Number: [id number] Date of Birth/Sex: Treating RN: [birth year] (77 y.o. [name] Primary Care Provider: Jerl [name] Other Clinician: Referring Provider: Treating Provider/Extender: [name]: 14 Verbal / Phone Orders: No Diagnosis Coding ICD-10 Coding Code Description E10.621 Type 1 diabetes mellitus with foot ulcer I73.89 Other specified peripheral vascular diseases KAVISH, LAFITTE ([id number]) [phone].pdf Page 4 of 8 [phone] Pressure ulcer of left heel, stage 3 L89.613 Pressure ulcer of right heel, stage 3 L98.412 Non-pressure chronic ulcer of buttock with fat layer exposed I25.10 Atherosclerotic heart disease of native coronary artery without angina pectoris I50.42 Chronic  combined systolic (congestive) and diastolic (congestive) heart failure I10 Essential (primary) hypertension Follow-up Appointments Return Appointment in 1 week. Nurse Visit as  needed Home Health DISCONTINUE Home Health for Wound Care. - Patient no longer needs home health wound care Bathing/ Shower/ Hygiene May shower; gently cleanse wound with antibacterial soap, rinse and pat dry prior to dressing wounds No tub bath. - no soaking foot in tub Anesthetic (Use 'Patient Medications' Section for Anesthetic Order Entry) Lidocaine applied to wound bed Edema Control - Lymphedema / Segmental Compressive Device / Other Elevate, Exercise Daily and A void Standing for Long Periods of Time. Elevate legs to the level of the heart and pump ankles as often as possible Elevate leg(s) parallel to the floor when sitting. DO YOUR BEST to sleep in the bed at night. DO NOT sleep in your recliner. Long hours of sitting in a recliner leads to swelling of the legs and/or potential wounds on your backside. Wound [name] Wound #5 - Calcaneus Wound Laterality: Left Cleanser: Byram Ancillary Kit - 15 Day Supply (Generic) 1 x Per Day/30 Days Discharge Instructions: Use supplies as instructed; Kit contains: (15) Saline Bullets; (15) 3x3 Gauze; 15 pr Gloves Cleanser: Soap and Water 1 x Per Day/30 Days Discharge Instructions: Gently cleanse wound with antibacterial soap, rinse and pat dry prior to dressing wounds Prim Dressing: Hydrofera Blue Ready Transfer Foam, 2.5x2.5 (in/in) (Generic) 1 x Per Day/30 Days ary Discharge Instructions: Apply Hydrofera Blue Ready to wound bed as directed Please cut a small cirle to fit in wound bed and then place a square piece over top Secondary Dressing: (BORDER) Zetuvit Plus SILICONE BORDER Dressing 4x4 (in/in) (Generic) 1 x Per Day/30 Days Discharge Instructions: Please do not put silicone bordered dressings under wraps. Use non-bordered dressing only. Electronic Signature(s) Signed: 03/06/2023 3:46:15 PM By: [name] Signed: 03/06/2023 5:47:58 PM By: [name] Derry [name] Entered By: [name] on 03/06/2023  15:46:15 -------------------------------------------------------------------------------- Problem List Details Patient Name: Date of Service: [name] [name] 03/06/2023 3:00 PM Medical Record Number: [id number] Patient Account Number: [id number] Date of Birth/Sex: Treating RN: [birth year] (77 y.o. [name] Primary Care Provider: Jerl [name] Other Clinician: Referring Provider: Treating Provider/Extender: [name]: 14 Active Problems ICD-10 Encounter Code Description Active Date MDM Diagnosis E10.621 Type 1 diabetes mellitus with foot ulcer 11/26/2022 No Yes I73.89 Other specified peripheral vascular diseases 11/26/2022 No Yes Tholl, Fenton [name] ([id number]) [phone].pdf Page 5 of 8 [phone] Pressure ulcer of left heel, stage 3 11/26/2022 No Yes L89.613 Pressure ulcer of right heel, stage 3 11/26/2022 No Yes L98.412 Non-pressure chronic ulcer of buttock with fat layer exposed 11/26/2022 No Yes I25.10 Atherosclerotic heart disease of native coronary artery without angina pectoris 11/26/2022 No Yes I50.42 Chronic combined systolic (congestive) and diastolic (congestive) heart failure 11/26/2022 No Yes I10 Essential (primary) hypertension 11/26/2022 No Yes Inactive Problems Resolved Problems Electronic Signature(s) Signed: 03/06/2023 3:17:49 PM By: [name] Derry [name] Entered By: [name] on 03/06/2023 15:17:48 -------------------------------------------------------------------------------- Progress Note Details Patient Name: Date of Service: [name] [name] 03/06/2023 3:00 PM Medical Record Number: [id number] Patient Account Number: [id number] Date of Birth/Sex: Treating RN: 1946/02/19 (77 y.o. [name] Primary Care Provider: Jerl [name] Other Clinician: Referring Provider: Treating Provider/Extender: [name]: 14 Subjective Chief  Complaint Information obtained from Patient Left heel pressure ulcer History of Present Illness (HPI) 77 year old male recently seen by his PCP Dr. Mickel [name] who saw him for a  wound on the right leg which is less tender and he is continuing to use bacitracin and has completed her course of doxycycline. Past medical history of coronary artery disease, diabetes mellitus type 1, hyperlipidemia, hypertension, MI, plantar fascial fibromatosis, pneumonia, status post foot surgery due to trauma on the left side, coronary angioplasty. he is a former smoker and quit in July 1996. last hemoglobin A1c was 7.8 % in April of this year 5/22 2017 -- biopsy of the right lower extremity was sent for pathology and the report is that of a basal cell carcinoma with edges of the biopsy are involved. I have called the patient over the phone( 03/28/16) and discussed the pathology report with him and he is agreeable to see a surgeon for excision and need full. I have also spoken personally to [name], of [facility] surgical and referred the patient for further wide excision of this lesion and have communicated this to the patient. READMISSION 08/12/18 This is a now 77 year old man with type 1 diabetes. Hemoglobin A1c on 4/29 was 7.9. He is listed as having a history of PAD in the [place] records although the patient doesn't recall this, doesn't complain of any symptoms of claudication and doesn't believe he has had any prior noninvasive arterial test. He is a nonsmoker. He was here in 2017 with a wound on his Right lower leg. This was biopsied in the clinic and pathology showed a basal cell carcinoma. He went on to have surgery on this area this is currently where I believe his current wounds are located. He generally bumped the lateral part of his right calf 2 weeks ago and has a superficial abrasion. He also has 2 small open areas on the medial part of the tibia in the same area. Our intake nurse noted a  stitch coming out of this which was removed. These of the wound sees actually come to the clinic for. However the more concerning area is an area on the tip of the left great toe. He says that this was initially traumatic and he's been to see [name] podiatrist. [name] been using what I think is Santyl to the wound tip. This has some depth. ABIs in this clinic were non-obtainable on the right and 1.84 on the left. 08/19/18; the patient had arterial studies that did not show evidence of significant hemodynamically compromising occlusions in either leg. There was mild [name], [name] ([id number]) [id number] Page 6 of 8 medial atherosclerosis bilaterally. His resting ABIs were within the normal limits at 1.3 on the right and 1.4 on the left. There was normal posterior and anterior tibial artery waveforms and velocities listed on both sides. The patient arrives with what appears to be a healthy surface over the tip of his left great toe. This is indeed surprising. Still looks somewhat friable however. More concerning is the x-ray that we did that showed erosions of the tuft of the distal phalanx the left great toe consistent with osteomyelitis. I attempted to pull this x-ray up on colon health Link however I can't open the film to look at this myself. 08/26/18; I reviewed the patient's x-ray and colon helpline. Indeed there is fairly obvious erosion of the tip of his left great toe. The wound was initially a traumatic area hitting it sometime in the middle of night in his kitchen. He is a type I diabetic. I have him on Flagyl and Levaquin that I started last week i.e. 7 days ago. He has an appointment  with infectious disease on 09/01/18 09/02/18; the patient was seen by [name] of infectious disease. She did not feel the patient needed IV antibiotics. I do feel he needs further oral antibiotics however. He does not need anymore Flagyl but he does need another  2 weeks of Levaquin. The areas just about closed. 09/16/18; the patient is completing his Levaquin today. There is no open wound on the tip of the toe Readmission: 11-26-2022 upon evaluation today patient presents for initial inspection here in our clinic concerning issues that he has been having after having been hospitalized due to septic arthritis. Subsequently he has a PICC line that is been removed and he has been discharged from infectious disease in that regard. Unfortunately he developed shearing wounds over the gluteal region as well as a pressure ulcer over each heel although they are not looking extremely bad nonetheless they do seem to be evidence of pressure which she sustained while hospitalized. Obviously he was very sick and is still recovering as far as that is concerned. Fortunately there does not appear to be any evidence of active infection systemically which is good news at this point according to Dr. [name]. Patient's wounds also do not appear to be too bad at this point which is good news. Patient does have a history of type 1 diabetes mellitus, peripheral vascular disease, coronary artery disease, congestive heart failure, hypertension, and he is on long-term anticoagulant therapy. 12-03-2022 upon evaluation today patient appears to be doing better to some degree in regard to his ulcers on his heels at this point. We are still waiting on the actual appointment with the vascular surgeons but nonetheless in the meantime I do believe that he is making some good progress currently with regard to loosen up the eschar. I did actually feel like that there was some ability for [place] to remove some of the necrotic tissue today and I discussed that with the patient. He is in agreement with that plan. We also can probably see about making an adjustment in the [name] regimen. 12-10-2022 upon evaluation today patient's wounds were really doing about the same. Again he is still awaiting the  evaluation with the vascular surgeon. That should have already been done but had to be pushed back unfortunately. He will be seeing them next week on the seventh. I plan to see him following somewhere around the end of the week I think will probably be best. The patient voiced understanding. 12-19-2022 upon evaluation today patient appears to be doing well currently in regard to his wound we did get the results of his arterial studies which show that he has excellent ABIs with no signs of vascular compromise. For that reason I will go ahead and perform some debridement today to clearway the necrotic debris with his Eliquis that I will be too aggressive so this will still be a stepwise process but we are to go ahead and get that started. 12-27-2022 upon evaluation today patient appears to be doing well currently in regard to his wounds. They are actually showing signs of excellent improvement. There is some necrotic tissue noted on the surface of wounds to work on clearing some of that away today also I think that we may switch to [facility] dressing to see how things do going forward. He is in agreement with that plan. Subsequently I am extremely pleased with where we stand currently. No fevers, chills, nausea, vomiting, or diarrhea. 01-02-2023 upon evaluation today patient appears to be doing much  better in regard to his wounds the right foot appears to be almost healed the left foot though not completely healed is significantly better. Fortunately there does not appear to be any signs of active infection at this point. 01-10-2023 upon evaluation today patient appears to be doing well currently in regard to his wounds. The right heel actually is completely healed the left heel is definitely headed in the right direction and looks to be doing awesome. I do not see any signs of active infection locally nor systemically at this time which is great news. 3/7; his right heel has remained healed. There is no  evidence of active infection. The remaining wound is on the lateral aspect of the heel just above the plantar surface 01-30-2023 upon evaluation today patient appears to be doing well currently in regard to his wound. The heel actually showing signs of improvement which is great news and fortunately I do not see any evidence of active infection locally nor systemically at this time. Overall I think that we are headed in the right direction which is excellent here. No fevers, chills, nausea, vomiting, or diarrhea. I am good have to perform some sharp debridement today. This is just the left heel that is remaining open. 02-06-2023 upon evaluation today patient appears to be doing well currently in regard to his wound. Has been tolerating the dressing changes without complication. Fortunately there does not appear to be any signs of active infection locally nor systemically although he does have some need for sharp debridement with some definite necrotic tissue at the base of the wound today. 02-13-2023 upon evaluation today patient appears to be doing well currently in regard to his wound which I feel like is slowly clearing up. I did review his growth labs as well the sed rate and C-reactive protein are down his white blood cell count was normal his hemoglobin is coming up. He did see Dr. Joylene Draft on Tuesday and she did continue him on the doxycycline considering his lab numbers but nonetheless he seems to be making some pretty good progress here in my opinion. The wound is slowly turnaround serial debridements are obviously helping with that being said I do believe that he is going to still require dressing changes and close monitoring. 02-20-2023 upon evaluation today patient's wound actually appears to be making some slight progress. This is slowly but surely moving into the right direction. Fortunately I do not see any signs of active infection at this time. 02-27-2023 upon evaluation today patient  appears to be doing well currently in regard to his wound. He has been tolerating the dressing changes. I think the Hydrofera Blue is doing a good job and he seems to be doing well with that currently. 03-06-2023 upon evaluation today patient appears to be doing well currently in regard to his wounds were actually seeing signs of improvement which is great news. Fortunately I do not see any evidence of active infection locally nor systemically which is great news and overall I do believe that we are moving in the right direction. Objective Constitutional Well-nourished and well-hydrated in no acute distress. [name], [name] ([id number]) [id number] Page 7 of 8 Vitals Time Taken: 3:11 PM, Height: 69 in, Weight: 190 lbs, BMI: 28.1, Temperature: 97.8 F, Pulse: 84 bpm, Respiratory Rate: 18 breaths/min, Blood Pressure: 115/61 mmHg. Respiratory normal breathing without difficulty. Psychiatric this patient is able to make decisions and demonstrates good insight into disease process. Alert and Oriented x 3. pleasant and cooperative. General  Notes: Upon inspection patient's wound bed actually showed signs of good granulation epithelization at this point. Fortunately there does not appear to be any signs of active infection locally nor systemically which is great news and overall I am extremely pleased with where we stand at this point. Integumentary (Hair, Skin) Wound #5 status is Open. Original cause of wound was Pressure Injury. The date acquired was: 10/20/2022. The wound has been in [name] 14 weeks. The wound is located on the Left Calcaneus. The wound measures 1.6cm length x 1.1cm width x 1.2cm depth; 1.382cm^2 area and 1.659cm^3 volume. There is Fat Layer (Subcutaneous Tissue) exposed. There is no tunneling or undermining noted. There is a medium amount of serosanguineous drainage noted. There is medium (34-66%) red, pink granulation within the wound bed. There  is a medium (34-66%) amount of necrotic tissue within the wound bed including Adherent Slough. 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 There was a Excisional Skin/Subcutaneous Tissue Debridement with a total area of 1.38 sq cm performed by [name] Derry, [name]. With the following instrument(s): Curette to remove Viable and Non-Viable tissue/material. Material removed includes Subcutaneous Tissue and Slough and. No specimens were taken. A time out was conducted at 15:24, prior to the start of the procedure. A Moderate amount of bleeding was controlled with Pressure. The procedure was tolerated well. Post Debridement Measurements: 1.6cm length x 1.1cm width x 1.2cm depth; 1.659cm^3 volume. Post debridement Stage noted as Category/Stage III. Character of Wound/Ulcer Post Debridement is stable. Post procedure Diagnosis Wound #5: Same as Pre-Procedure Plan Follow-up Appointments: Return Appointment in 1 week. Nurse Visit as needed Home Health: DISCONTINUE Home Health for Wound Care. - Patient no longer needs home health wound care Bathing/ Shower/ Hygiene: May shower; gently cleanse wound with antibacterial soap, rinse and pat dry prior to dressing wounds No tub bath. - no soaking foot in tub Anesthetic (Use 'Patient Medications' Section for Anesthetic Order Entry): Lidocaine applied to wound bed Edema Control - Lymphedema / Segmental Compressive Device / Other: Elevate, Exercise  Daily and Avoid Standing for Long Periods of Time. Elevate legs to the level of the heart and pump ankles as often as possible Elevate leg(s) parallel to the floor when sitting. DO YOUR BEST to sleep in the bed at night. DO NOT sleep in your recliner. Long hours of sitting in a recliner leads to swelling of the legs and/or potential wounds on your backside. WOUND #5: - Calcaneus Wound Laterality: Left Cleanser: [employer]ram Ancillary Kit - 15 Day Supply (Generic) 1 x Per Day/30 Days Discharge Instructions: Use supplies as instructed; Kit contains: (15) Saline Bullets; (15) 3x3 Gauze; 15 pr Gloves Cleanser: Soap and Water 1 x Per Day/30 Days Discharge Instructions: Gently cleanse wound with antibacterial soap, rinse and pat dry prior to dressing wounds Prim Dressing: Hydrofera Blue Ready Transfer Foam, 2.5x2.5 (in/in) (Generic) 1 x Per Day/30 Days ary Discharge Instructions: Apply Hydrofera Blue Ready to wound bed as directed Please cut a small cirle to fit in wound bed and then place a square piece over top Secondary Dressing: (BORDER) Zetuvit Plus  SILICONE BORDER Dressing 4x4 (in/in) (Generic) 1 x Per Day/30 Days Discharge Instructions: Please do not put silicone bordered dressings under wraps. Use non-bordered dressing only. [name], [name] ([id number]) [id number] Page 8 of 8 1. I am and I suggest that we have the patient continue to monitor for any signs of infection or worsening. Based on what I am seeing I do believe that he is doing well I think he would benefit from initiation of the snap VAC but we need to make sure that we have the appropriate 1 I think with the one that actually does have the tracking otherwise this is going to be to risky as far as the chance of him walking and causing anything to breakdown in regard to the wound is concerned. This would make the plastic port be too close to the bottom of his foot. 2. Once we get the wound VAC that is the  snap VAC we will get that initiated as quickly as possible in the meantime we are going to have the patient continue with the [facility] as well as the bordered foam dressing. We will see patient back for reevaluation in 1 week here in the clinic. If anything worsens or changes patient will contact our office for additional recommendations. Electronic Signature(s) Signed: 03/06/2023 4:21:24 PM By: [name] Derry [name] Entered By: [name] on 03/06/2023 16:21:24 -------------------------------------------------------------------------------- SuperBill Details Patient Name: Date of Service: [name] [name] 03/06/2023 Medical Record Number: [id number] Patient Account Number: [id number] Date of Birth/Sex: Treating RN: 03/31/1946 (77 y.o. [name] Primary Care Provider: Jerl [name] Other Clinician: Referring Provider: Treating Provider/Extender: [name]: 14 Diagnosis Coding ICD-10 Codes Code Description E10.621 Type 1 diabetes mellitus with foot ulcer I73.89 Other specified peripheral vascular diseases L89.623 Pressure ulcer of left heel, stage 3 L89.613 Pressure ulcer of right heel, stage 3 L98.412 Non-pressure chronic ulcer of buttock with fat layer exposed I25.10 Atherosclerotic heart disease of native coronary artery without angina pectoris I50.42 Chronic combined systolic (congestive) and diastolic (congestive) heart failure I10 Essential (primary) hypertension Facility Procedures : CPT4 Code: [id number] Description: 11042 - DEB SUBQ TISSUE 20 SQ CM/< ICD-10 Diagnosis Description L89.623 Pressure ulcer of left heel, stage 3 Modifier: Quantity: 1 Physician Procedures : CPT4 Code Description Modifier [id number] 11042 - WC PHYS SUBQ TISS 20 SQ CM ICD-10 Diagnosis Description L89.623 Pressure ulcer of left heel, stage 3 Quantity: 1 Electronic Signature(s) Signed: 03/06/2023 4:21:41 PM By: [name] Derry [name] Entered By: [name] on 03/06/2023 16:21:40

## 2023-03-07 NOTE — Progress Notes (Signed)
MARSEL, [name] ([id number]) [id number] Page 1 of 7 Visit Report for 03/06/2023 Arrival Information Details Patient Name: Date of Service: [name], [name] 03/06/2023 3:00 PM Medical Record Number: [id number] Patient Account Number: [id number] Date of Birth/Sex: Treating RN: 10-Apr-1946 (77 y.o. [name] Primary Care [name]: [name] Other Clinician: Referring [name]: Treating Kadien Lineman/Extender: [name] in Treatment: 14 Visit Information History Since Last Visit Added [name] deleted any medications: No Patient Arrived: [name] Any new allergies [name] adverse reactions: No Arrival Time: 15:10 Had a fall [name] experienced change in No Accompanied By: spouse activities of daily living that may affect Transfer Assistance: None risk of falls: Patient Identification Verified: Yes Hospitalized since last visit: No Secondary Verification Process Completed: Yes Has Dressing in Place as Prescribed: Yes Patient Requires Transmission-Based Precautions: No Pain Present Now: No Patient Has Alerts: Yes Patient Alerts: Type I Diabetic eliquis ABI/TBI normal see scan Electronic Signature(s) Signed: 03/06/2023 4:36:23 PM By: [name] Entered By: [name] on 03/06/2023 15:11:01 -------------------------------------------------------------------------------- Clinic Level of Care Assessment Details Patient Name: Date of Service: [name], [name] 03/06/2023 3:00 PM Medical Record Number: [id number] Patient Account Number: [id number] Date of Birth/Sex: Treating RN: 13-Apr-1946 (77 y.o. [name] Primary Care Candance Bohlman: [name] Other Clinician: Referring [name]: Treating [name]/Extender: [name] in Treatment: 14 Clinic Level of Care Assessment Items TOOL 1 Quantity Score []  - 0 Use when EandM and Procedure is performed on INITIAL visit ASSESSMENTS - Nursing  Assessment / Reassessment []  - 0 General Physical Exam (combine w/ comprehensive assessment (listed just below) when performed on new pt. evals) []  - 0 Comprehensive Assessment (HX, ROS, Risk Assessments, Wounds Hx, etc.) ASSESSMENTS - Wound and Skin Assessment / Reassessment []  - 0 Dermatologic / Skin Assessment (not related to wound area) ASSESSMENTS - Ostomy and/[name] Continence Assessment and Care []  - 0 Incontinence Assessment and Management []  - 0 Ostomy Care Assessment and Management (repouching, etc.) PROCESS - Coordination of Care []  - 0 Simple Patient / Family Education for ongoing care []  - 0 Complex (extensive) Patient / Family Education for ongoing care []  - 0 Staff obtains Chiropractor, Records, T Results / Process Orders est []  - 0 Staff telephones HHA, Nursing Homes / Clarify orders / etc []  - 0 Routine Transfer to another Facility (non-emergent condition) []  - 0 Routine Hospital Admission (non-emergent condition) []  - 0 New Admissions / Insurance Authorizations / Ordering NPWT Apligraf, etc. , []  - 0 Emergency Hospital Admission (emergent condition) [name] ([id number]) [phone].pdf Page 2 of 7 PROCESS - Special Needs []  - 0 Pediatric / Minor Patient Management []  - 0 Isolation Patient Management []  - 0 Hearing / Language / Visual special needs []  - 0 Assessment of Community assistance (transportation, D/C planning, etc.) []  - 0 Additional assistance / Altered mentation []  - 0 Support Surface(s) Assessment (bed, cushion, seat, etc.) INTERVENTIONS - Miscellaneous []  - 0 External ear exam []  - 0 Patient Transfer (multiple staff / Nurse, adult / Similar devices) []  - 0 Simple Staple / Suture removal (25 [name] less) []  - 0 Complex Staple / Suture removal (26 [name] more) []  - 0 Hypo/Hyperglycemic Management (do not check if billed separately) []  - 0 Ankle / Brachial Index (ABI) - do not check if billed separately Has the  patient been seen at the hospital within the last three years: Yes Total Score: 0 Level Of Care: ____ Electronic Signature(s) Signed: 03/06/2023 4:36:23 PM By: [name],  [name] Entered By: [name] on 03/06/2023 15:46:22 -------------------------------------------------------------------------------- Encounter Discharge Information Details Patient Name: Date of Service: [name], [name] 03/06/2023 3:00 PM Medical Record Number: [id number] Patient Account Number: [id number] Date of Birth/Sex: Treating RN: 10/07/1946 (77 y.o. [name] Primary Care Cesar Rogerson: [name] Other Clinician: Referring Mohannad Olivero: Treating [name]/Extender: [name] in Treatment: 14 Encounter Discharge Information Items Post Procedure Vitals Discharge Condition: Stable Temperature (F): 97.8 Ambulatory Status: Cane Pulse (bpm): 84 Discharge Destination: Home Respiratory Rate (breaths/min): 18 Transportation: Private Auto Blood Pressure (mmHg): 115/61 Accompanied By: spouse Schedule Follow-up Appointment: Yes Clinical Summary of Care: Electronic Signature(s) Signed: 03/06/2023 3:47:56 PM By: [name] Entered By: [name] on 03/06/2023 15:47:56 -------------------------------------------------------------------------------- Lower Extremity Assessment Details Patient Name: Date of Service: [name], [name] 03/06/2023 3:00 PM Medical Record Number: [id number] Patient Account Number: [id number] Date of Birth/Sex: Treating RN: [birth year] (77 y.o. [name] Primary Care Treylon Henard: [name] Other Clinician: Referring Atharva Mirsky: Treating [name]/Extender: [name] in Treatment: 14 Edema Assessment Assessed: [name]: No] [name]: No] [Left: Edema] [Right: :] P[Left: ORTERFIELD, [name] ([id number])] [Right: [id number] Page 3 of 7] Calf Left: Right: Point of Measurement: 34 cm  From Medial Instep 39.5 cm Ankle Left: Right: Point of Measurement: 11 cm From Medial Instep 28.9 cm Vascular Assessment Pulses: Dorsalis Pedis Doppler Audible: [Left:Yes] Posterior Tibial Doppler Audible: [Left:Yes] Electronic Signature(s) Signed: 03/06/2023 4:36:23 PM By: [name] Entered By: [name] on 03/06/2023 15:20:46 -------------------------------------------------------------------------------- Multi Wound Chart Details Patient Name: Date of Service: [name], [name] 03/06/2023 3:00 PM Medical Record Number: [id number] Patient Account Number: [id number] Date of Birth/Sex: Treating RN: 1946-08-14 (77 y.o. [name] Primary Care Kamaiya Antilla: [name] Other Clinician: Referring Hephzibah Strehle: Treating Christino Mcglinchey/Extender: [name] in Treatment: 14 Vital Signs Height(in): 69 Pulse(bpm): 84 Weight(lbs): 190 Blood Pressure(mmHg): 115/61 Body Mass Index(BMI): 28.1 Temperature(F): 97.8 Respiratory Rate(breaths/min): 18 [5:Photos:] [N/A:N/A] Left Calcaneus N/A N/A Wound Location: Pressure Injury N/A N/A Wounding Event: Pressure Ulcer N/A N/A Primary Etiology: Coronary Artery Disease, N/A N/A Comorbid History: Hypertension, Type I Diabetes, Osteoarthritis, Received Chemotherapy 10/20/2022 N/A N/A Date Acquired: 14 N/A N/A Weeks of Treatment: Open N/A N/A Wound Status: No N/A N/A Wound Recurrence: 1.6x1.1x1.2 N/A N/A Measurements L x W x D (cm) 1.382 N/A N/A A (cm) : rea 1.659 N/A N/A Volume (cm) : 70.70% N/A N/A % Reduction in A rea: -252.20% N/A N/A % Reduction in Volume: Category/Stage III N/A N/A Classification: Medium N/A N/A Exudate A mount: Serosanguineous N/A N/A Exudate Type: red, brown N/A N/A Exudate Color: Medium (34-66%) N/A N/A Granulation A mount: Red, Pink N/A N/A Granulation Quality: Medium (34-66%) N/A N/A Necrotic A mount: Fat Layer (Subcutaneous Tissue): Yes N/A  N/A Exposed Structures: Fascia: No [name], [name] ([id number]) [phone].pdf Page 4 of 7 Tendon: No Muscle: No Joint: No Bone: No Small (1-33%) N/A N/A Epithelialization: Debridement - Excisional N/A N/A Debridement: Pre-procedure Verification/Time Out 15:24 N/A N/A Taken: Subcutaneous, Slough N/A N/A Tissue Debrided: Skin/Subcutaneous Tissue N/A N/A Level: 1.38 N/A N/A Debridement A (sq cm): rea Curette N/A N/A Instrument: Moderate N/A N/A Bleeding: Pressure N/A N/A Hemostasis A chieved: Procedure was tolerated well N/A N/A Debridement Treatment Response: 1.6x1.1x1.2 N/A N/A Post Debridement Measurements L x W x D (cm) 1.659 N/A N/A Post Debridement Volume: (cm) Category/Stage III N/A N/A Post Debridement Stage: Debridement N/A N/A Procedures Performed: Treatment Notes Electronic Signature(s) Signed: 03/06/2023 3:45:55 PM By: [name]  Entered By: [name] on 03/06/2023 15:45:55 -------------------------------------------------------------------------------- Multi-Disciplinary Care Plan Details Patient Name: Date of Service: [name], [name] 03/06/2023 3:00 PM Medical Record Number: [id number] Patient Account Number: [id number] Date of Birth/Sex: Treating RN: 21-Jul-1946 (77 y.o. [name] Primary Care Ruperto Kiernan: [name] Other Clinician: Referring Caylee Vlachos: Treating Finnean Cerami/Extender: [name] in Treatment: 14 Active Inactive Pressure Nursing Diagnoses: Potential for impaired tissue integrity related to pressure, friction, moisture, and shear Goals: Patient will remain free of pressure ulcers Date Initiated: 11/26/2022 Target Resolution Date: 03/11/2023 Goal Status: Active Interventions: Assess: immobility, friction, shearing, incontinence upon admission and as needed Assess offloading mechanisms upon admission and as needed Assess potential for pressure ulcer upon  admission and as needed Notes: Wound/Skin Impairment Nursing Diagnoses: Knowledge deficit related to ulceration/compromised skin integrity Goals: Patient/caregiver will verbalize understanding of skin care regimen Date Initiated: 11/26/2022 Date Inactivated: 01/16/2023 Target Resolution Date: 12/03/2022 Goal Status: Met Ulcer/skin breakdown will have a volume reduction of 30% by week 4 Date Initiated: 11/26/2022 Date Inactivated: 02/13/2023 Target Resolution Date: 12/24/2022 Goal Status: Met Ulcer/skin breakdown will have a volume reduction of 50% by week 8 Date Initiated: 11/26/2022 Date Inactivated: 02/13/2023 Target Resolution Date: 01/21/2023 Goal Status: Unmet Unmet Reason: underlying conditions Ulcer/skin breakdown will have a volume reduction of 80% by week 12 Date Initiated: 11/26/2022 Date Inactivated: 02/27/2023 Target Resolution Date: 02/27/2023 [name], [name] ([id number]) [phone].pdf Page 5 of 7 Goal Status: Unmet Unmet Reason: underlying condition Ulcer/skin breakdown will heal within 14 weeks Date Initiated: 11/26/2022 Target Resolution Date: 03/18/2023 Goal Status: Active Interventions: Assess patient/caregiver ability to obtain necessary supplies Assess patient/caregiver ability to perform ulcer/skin care regimen upon admission and as needed Assess ulceration(s) every visit Notes: week 4 30% healing met for right calcaneus but not met for left calcaneus. Electronic Signature(s) Signed: 03/06/2023 3:46:40 PM By: [name] Entered By: [name] on 03/06/2023 15:46:40 -------------------------------------------------------------------------------- Pain Assessment Details Patient Name: Date of Service: [name], [name] 03/06/2023 3:00 PM Medical Record Number: [id number] Patient Account Number: [id number] Date of Birth/Sex: Treating RN: 05/18/1946 (77 y.o. [name] Primary Care Trashaun Streight: [name] Other  Clinician: Referring Keyondre Hepburn: Treating Shauniece Kwan/Extender: [name] in Treatment: 14 Active Problems Location of Pain Severity and Description of Pain Patient Has Paino No Site Locations Rate the pain. Current Pain Level: 0 Pain Management and Medication Current Pain Management: Electronic Signature(s) Signed: 03/06/2023 4:36:23 PM By: [name] Entered By: [name] on 03/06/2023 15:11:21 -------------------------------------------------------------------------------- Patient/Caregiver Education Details Patient Name: Date of Service: [name], [name] 4/25/2024andnbsp3:00 PM Medical Record Number: [id number] Patient Account Number: [id number] Date of Birth/Gender: Treating RN: May 15, 1946 (77 y.o. [name] Primary Care Physician: [name] Other Clinician: Referring Physician: Treating Physician/Extender: [name] in Treatment: [address] [name], [name] ([id number]) [id number] Page 6 of 7 Education Provided To: Patient and Caregiver Education Topics Provided Pain: Handouts: A Guide to Pain Control Methods: Explain/Verbal Responses: State content correctly Wound Debridement: Handouts: Wound Debridement Methods: Explain/Verbal Responses: State content correctly Wound/Skin Impairment: Handouts: Caring for Your Ulcer, Other: snap vac Methods: Explain/Verbal Responses: State content correctly Electronic Signature(s) Signed: 03/06/2023 4:36:23 PM By: [name] Entered By: [name] on 03/06/2023 15:47:11 -------------------------------------------------------------------------------- Wound Assessment Details Patient Name: Date of Service: [name], [name] 03/06/2023 3:00 PM Medical Record Number: [id number] Patient Account Number: [id number] Date of Birth/Sex: Treating RN: January 08, 1946 (77 y.o. [name] Primary Care  Michale Emmerich: Burnett Sheng,  Fayrene Fearing Other Clinician: Referring Heavan Francom: Treating Halston Fairclough/Extender: [name] in Treatment: 14 Wound Status Wound Number: 5 Primary Pressure Ulcer Etiology: Wound Location: Left Calcaneus Wound Open Wounding Event: Pressure Injury Status: Date Acquired: 10/20/2022 Comorbid Coronary Artery Disease, Hypertension, Type I Diabetes, Weeks Of Treatment: 14 History: Osteoarthritis, Received Chemotherapy Clustered Wound: No Photos Wound Measurements Length: (cm) 1.6 Width: (cm) 1.1 Depth: (cm) 1.2 Area: (cm) 1.382 Volume: (cm) 1.659 % Reduction in Area: 70.7% % Reduction in Volume: -252.2% Epithelialization: Small (1-33%) Tunneling: No Undermining: No Wound Description Classification: Category/Stage III Exudate Amount: Medium Exudate Type: Serosanguineous Exudate Color: red, brown Kissling, [name] ([id number]) Wound Bed Granulation Amount: Medium (34-66%) Granulation Quality: Red, Pink Necrotic Amount: Medium (34-66%) Necrotic Quality: Adherent Slough Foul Odor After Cleansing: No Slough/Fibrino Yes [phone].pdf Page 7 of 7 Exposed Structure Fascia Exposed: No Fat Layer (Subcutaneous Tissue) Exposed: Yes Tendon Exposed: No Muscle Exposed: No Joint Exposed: No Bone Exposed: No Treatment Notes Wound #5 (Calcaneus) Wound Laterality: Left Cleanser Byram Ancillary Kit - 15 Day Supply Discharge Instruction: Use supplies as instructed; Kit contains: (15) Saline Bullets; (15) 3x3 Gauze; 15 pr Gloves Soap and Water Discharge Instruction: Gently cleanse wound with antibacterial soap, rinse and pat dry prior to dressing wounds Peri-Wound Care Topical Primary Dressing Hydrofera Blue Ready Transfer Foam, 2.5x2.5 (in/in) Discharge Instruction: Apply Hydrofera Blue Ready to wound bed as directed Please cut a small cirle to fit in wound bed and then place a square piece over top Secondary  Dressing (BORDER) Zetuvit Plus SILICONE BORDER Dressing 4x4 (in/in) Discharge Instruction: Please do not put silicone bordered dressings under wraps. Use non-bordered dressing only. Secured With Compression Wrap Compression Stockings Add-Ons Electronic Signature(s) Signed: 03/06/2023 4:36:23 PM By: [name] Entered By: [name] on 03/06/2023 15:20:24 -------------------------------------------------------------------------------- Vitals Details Patient Name: Date of Service: [name], [name] 03/06/2023 3:00 PM Medical Record Number: [id number] Patient Account Number: [id number] Date of Birth/Sex: Treating RN: [birth year] (77 y.o. [name] Primary Care Marielouise Amey: [name] Other Clinician: Referring Dozier Berkovich: Treating Tinaya Ceballos/Extender: [name] in Treatment: 14 Vital Signs Time Taken: 15:11 Temperature (F): 97.8 Height (in): 69 Pulse (bpm): 84 Weight (lbs): 190 Respiratory Rate (breaths/min): 18 Body Mass Index (BMI): 28.1 Blood Pressure (mmHg): 115/61 Reference Range: 80 - 120 mg / dl Electronic Signature(s) Signed: 03/06/2023 4:36:23 PM By: [name] Entered By: [name] on 03/06/2023 15:11:16

## 2023-03-09 DIAGNOSIS — E109 Type 1 diabetes mellitus without complications: Secondary | ICD-10-CM | POA: Diagnosis not present

## 2023-03-09 DIAGNOSIS — E1065 Type 1 diabetes mellitus with hyperglycemia: Secondary | ICD-10-CM | POA: Diagnosis not present

## 2023-03-11 ENCOUNTER — Other Ambulatory Visit: Payer: Self-pay | Admitting: Cardiovascular Disease

## 2023-03-13 ENCOUNTER — Encounter: Payer: Medicare HMO | Attending: Physician Assistant | Admitting: Physician Assistant

## 2023-03-13 DIAGNOSIS — E1051 Type 1 diabetes mellitus with diabetic peripheral angiopathy without gangrene: Secondary | ICD-10-CM | POA: Diagnosis not present

## 2023-03-13 DIAGNOSIS — L98412 Non-pressure chronic ulcer of buttock with fat layer exposed: Secondary | ICD-10-CM | POA: Insufficient documentation

## 2023-03-13 DIAGNOSIS — I2510 Atherosclerotic heart disease of native coronary artery without angina pectoris: Secondary | ICD-10-CM | POA: Diagnosis not present

## 2023-03-13 DIAGNOSIS — L89613 Pressure ulcer of right heel, stage 3: Secondary | ICD-10-CM | POA: Insufficient documentation

## 2023-03-13 DIAGNOSIS — I110 Hypertensive heart disease with heart failure: Secondary | ICD-10-CM | POA: Diagnosis not present

## 2023-03-13 DIAGNOSIS — E10622 Type 1 diabetes mellitus with other skin ulcer: Secondary | ICD-10-CM | POA: Diagnosis not present

## 2023-03-13 DIAGNOSIS — L89623 Pressure ulcer of left heel, stage 3: Secondary | ICD-10-CM | POA: Insufficient documentation

## 2023-03-13 DIAGNOSIS — I5042 Chronic combined systolic (congestive) and diastolic (congestive) heart failure: Secondary | ICD-10-CM | POA: Insufficient documentation

## 2023-03-14 NOTE — Progress Notes (Signed)
MENASHE, LYDEN ([id number]) [id number] Page 1 of 7 Visit Report for 03/13/2023 Arrival Information Details Patient Name: Date of Service: [name], [name] 03/13/2023 3:00 PM Medical Record Number: [id number] Patient Account Number: [id number] Date of Birth/Sex: Treating RN: Nov 25, 1945 (77 y.o. [name] Primary Care Caya Soberanis: [name] Other Clinician: Referring Manasvi Dickard: Treating Imoni Kohen/Extender: [name] in Treatment: 15 Visit Information History Since Last Visit Added or deleted any medications: No Patient Arrived: [name] Any new allergies or adverse reactions: No Arrival Time: 15:09 Had a fall or experienced change in No Accompanied By: spouse activities of daily living that may affect Transfer Assistance: None risk of falls: Patient Identification Verified: Yes Hospitalized since last visit: No Secondary Verification Process Completed: Yes Has Dressing in Place as Prescribed: Yes Patient Requires Transmission-Based Precautions: No Pain Present Now: No Patient Has Alerts: Yes Patient Alerts: Type I Diabetic eliquis ABI/TBI normal see scan Electronic Signature(s) Signed: 03/13/2023 4:58:56 PM By: Angelina Pih Entered By: Angelina Pih on 03/13/2023 15:09:45 -------------------------------------------------------------------------------- Clinic Level of Care Assessment Details Patient Name: Date of Service: [name], [name] 03/13/2023 3:00 PM Medical Record Number: [id number] Patient Account Number: [id number] Date of Birth/Sex: Treating RN: May 28, 1946 (77 y.o. [name] Primary Care Marlita Keil: [name] Other Clinician: Referring Alberto Pina: Treating Deseri Loss/Extender: [name] in Treatment: 15 Clinic Level of Care Assessment Items TOOL 1 Quantity Score []  - 0 Use when EandM and Procedure is performed on INITIAL visit ASSESSMENTS - Nursing  Assessment / Reassessment []  - 0 General Physical Exam (combine w/ comprehensive assessment (listed just below) when performed on new pt. evals) []  - 0 Comprehensive Assessment (HX, ROS, Risk Assessments, Wounds Hx, etc.) ASSESSMENTS - Wound and Skin Assessment / Reassessment []  - 0 Dermatologic / Skin Assessment (not related to wound area) ASSESSMENTS - Ostomy and/or Continence Assessment and Care []  - 0 Incontinence Assessment and Management []  - 0 Ostomy Care Assessment and Management (repouching, etc.) PROCESS - Coordination of Care []  - 0 Simple Patient / Family Education for ongoing care []  - 0 Complex (extensive) Patient / Family Education for ongoing care []  - 0 Staff obtains Chiropractor, Records, T Results / Process Orders est []  - 0 Staff telephones HHA, Nursing Homes / Clarify orders / etc []  - 0 Routine Transfer to another Facility (non-emergent condition) []  - 0 Routine Hospital Admission (non-emergent condition) []  - 0 New Admissions / Insurance Authorizations / Ordering NPWT Apligraf, etc. , []  - 0 Emergency Hospital Admission (emergent condition) Fitzgerald, Deshannon H ([id number]) [id number] Page 2 of 7 PROCESS - Special Needs []  - 0 Pediatric / Minor Patient Management []  - 0 Isolation Patient Management []  - 0 Hearing / Language / Visual special needs []  - 0 Assessment of Community assistance (transportation, D/C planning, etc.) []  - 0 Additional assistance / Altered mentation []  - 0 Support Surface(s) Assessment (bed, cushion, seat, etc.) INTERVENTIONS - Miscellaneous []  - 0 External ear exam []  - 0 Patient Transfer (multiple staff / Nurse, adult / Similar devices) []  - 0 Simple Staple / Suture removal (25 or less) []  - 0 Complex Staple / Suture removal (26 or more) []  - 0 Hypo/Hyperglycemic Management (do not check if billed separately) []  - 0 Ankle / Brachial Index (ABI) - do not check if billed separately Has the  patient been seen at the hospital within the last three years: Yes Total Score: 0 Level Of Care: ____ Electronic Signature(s) Signed: 03/13/2023 4:58:56 PM By: Roger Shelter,  Caitlin Entered By: Angelina Pih on 03/13/2023 16:56:40 -------------------------------------------------------------------------------- Encounter Discharge Information Details Patient Name: Date of Service: [name], [name] 03/13/2023 3:00 PM Medical Record Number: [id number] Patient Account Number: [id number] Date of Birth/Sex: Treating RN: 05-20-1946 (77 y.o. [name] Primary Care Murel Shenberger: [name] Other Clinician: Referring Nandika Stetzer: Treating Audrianna Driskill/Extender: [name] in Treatment: 15 Encounter Discharge Information Items Post Procedure Vitals Discharge Condition: Stable Temperature (F): 98 Ambulatory Status: Cane Pulse (bpm): 84 Discharge Destination: Home Respiratory Rate (breaths/min): 18 Transportation: Private Auto Blood Pressure (mmHg): 117/77 Accompanied By: spouse Schedule Follow-up Appointment: Yes Clinical Summary of Care: Electronic Signature(s) Signed: 03/13/2023 4:58:31 PM By: Angelina Pih Entered By: Angelina Pih on 03/13/2023 16:58:31 -------------------------------------------------------------------------------- Lower Extremity Assessment Details Patient Name: Date of Service: [name], [name] 03/13/2023 3:00 PM Medical Record Number: [id number] Patient Account Number: [id number] Date of Birth/Sex: Treating RN: 1946-04-07 (77 y.o. [name] Primary Care Aviya Jarvie: [name] Other Clinician: Referring Anibal Quinby: Treating Kamber Vignola/Extender: Claudia Pollock Weeks in Treatment: 15 Edema Assessment Assessed: Kyra Searles: No] [Right: No] Edema: [Left: Ye] [Right: s] P[Left: ORTERFIELD, Khylon H ([id number])] [Right: [id number] Page 3 of 7] Calf Left: Right: Point of Measurement: 34 cm From  Medial Instep 39.5 cm Ankle Left: Right: Point of Measurement: 11 cm From Medial Instep 29.2 cm Vascular Assessment Pulses: Dorsalis Pedis Palpable: [Left:Yes] Posterior Tibial Palpable: [Left:Yes] Electronic Signature(s) Signed: 03/13/2023 4:58:56 PM By: Angelina Pih Entered By: Angelina Pih on 03/13/2023 15:28:14 -------------------------------------------------------------------------------- Multi Wound Chart Details Patient Name: Date of Service: [name], [name] 03/13/2023 3:00 PM Medical Record Number: [id number] Patient Account Number: [id number] Date of Birth/Sex: Treating RN: [birth year] (77 y.o. [name] Primary Care Jann Milkovich: [name] Other Clinician: Referring Irlanda Croghan: Treating Danyl Deems/Extender: [name] in Treatment: 15 Vital Signs Height(in): 69 Pulse(bpm): 84 Weight(lbs): 190 Blood Pressure(mmHg): 117/77 Body Mass Index(BMI): 28.1 Temperature(F): 98 Respiratory Rate(breaths/min): 18 [5:Photos:] [N/A:N/A] Left Calcaneus N/A N/A Wound Location: Pressure Injury N/A N/A Wounding Event: Pressure Ulcer N/A N/A Primary Etiology: Coronary Artery Disease, N/A N/A Comorbid History: Hypertension, Type I Diabetes, Osteoarthritis, Received Chemotherapy 10/20/2022 N/A N/A Date Acquired: 15 N/A N/A Weeks of Treatment: Open N/A N/A Wound Status: No N/A N/A Wound Recurrence: 1.6x1x0.9 N/A N/A Measurements L x W x D (cm) 1.257 N/A N/A A (cm) : rea 1.131 N/A N/A Volume (cm) : 73.30% N/A N/A % Reduction in A rea: -140.10% N/A N/A % Reduction in Volume: Category/Stage III N/A N/A Classification: Medium N/A N/A Exudate A mount: Serosanguineous N/A N/A Exudate Type: red, brown N/A N/A Exudate Color: Medium (34-66%) N/A N/A Granulation A mount: Red, Pink N/A N/A Granulation Quality: Medium (34-66%) N/A N/A Necrotic A mount: Fat Layer (Subcutaneous Tissue): Yes N/A N/A Exposed  Structures: Fascia: No Yerger, Fidencio H ([id number]) [id number] Page 4 of 7 Tendon: No Muscle: No Joint: No Bone: No Small (1-33%) N/A N/A Epithelialization: Treatment Notes Electronic Signature(s) Signed: 03/13/2023 4:58:56 PM By: Angelina Pih Entered By: Angelina Pih on 03/13/2023 15:29:16 -------------------------------------------------------------------------------- Multi-Disciplinary Care Plan Details Patient Name: Date of Service: [name], [name] 03/13/2023 3:00 PM Medical Record Number: [id number] Patient Account Number: [id number] Date of Birth/Sex: Treating RN: 04-29-1946 (77 y.o. [name] Primary Care Yulian Gosney: [name] Other Clinician: Referring Keghan Mcfarren: Treating Zanai Mallari/Extender: [name] in Treatment: 15 Active Inactive Pressure Nursing Diagnoses: Potential for impaired tissue integrity related to pressure, friction, moisture, and shear Goals: Patient will remain free of pressure  ulcers Date Initiated: 11/26/2022 Target Resolution Date: 03/18/2023 Goal Status: Active Interventions: Assess: immobility, friction, shearing, incontinence upon admission and as needed Assess offloading mechanisms upon admission and as needed Assess potential for pressure ulcer upon admission and as needed Notes: Wound/Skin Impairment Nursing Diagnoses: Knowledge deficit related to ulceration/compromised skin integrity Goals: Patient/caregiver will verbalize understanding of skin care regimen Date Initiated: 11/26/2022 Date Inactivated: 01/16/2023 Target Resolution Date: 12/03/2022 Goal Status: Met Ulcer/skin breakdown will have a volume reduction of 30% by week 4 Date Initiated: 11/26/2022 Date Inactivated: 02/13/2023 Target Resolution Date: 12/24/2022 Goal Status: Met Ulcer/skin breakdown will have a volume reduction of 50% by week 8 Date Initiated: 11/26/2022 Date Inactivated: 02/13/2023 Target  Resolution Date: 01/21/2023 Goal Status: Unmet Unmet Reason: underlying conditions Ulcer/skin breakdown will have a volume reduction of 80% by week 12 Date Initiated: 11/26/2022 Date Inactivated: 02/27/2023 Target Resolution Date: 02/27/2023 Goal Status: Unmet Unmet Reason: underlying condition Ulcer/skin breakdown will heal within 14 weeks Date Initiated: 11/26/2022 Target Resolution Date: 03/18/2023 Goal Status: Active Interventions: Assess patient/caregiver ability to obtain necessary supplies Assess patient/caregiver ability to perform ulcer/skin care regimen upon admission and as needed Assess ulceration(s) every visit Notes: week 4 30% healing met for right calcaneus but not met for left calcaneus. CORRIE, TOMINAGA ([id number]) [id number] Page 5 of 7 Electronic Signature(s) Signed: 03/13/2023 4:57:11 PM By: Angelina Pih Entered By: Angelina Pih on 03/13/2023 16:57:11 -------------------------------------------------------------------------------- Pain Assessment Details Patient Name: Date of Service: [name], [name] 03/13/2023 3:00 PM Medical Record Number: [id number] Patient Account Number: [id number] Date of Birth/Sex: Treating RN: 1946/06/06 (77 y.o. [name] Primary Care Ever Gustafson: [name] Other Clinician: Referring Pete Merten: Treating Calvert Charland/Extender: [name] in Treatment: 15 Active Problems Location of Pain Severity and Description of Pain Patient Has Paino No Site Locations Rate the pain. Current Pain Level: 0 Pain Management and Medication Current Pain Management: Electronic Signature(s) Signed: 03/13/2023 4:58:56 PM By: Angelina Pih Entered By: Angelina Pih on 03/13/2023 15:12:34 -------------------------------------------------------------------------------- Patient/Caregiver Education Details Patient Name: Date of Service: [name], CLA Reine Just 5/2/2024andnbsp3:00  PM Medical Record Number: [id number] Patient Account Number: [id number] Date of Birth/Gender: Treating RN: 1946/09/24 (77 y.o. [name] Primary Care Physician: [name] Other Clinician: Referring Physician: Treating Physician/Extender: [name] in Treatment: 15 Education Assessment Education Provided To: Patient and Caregiver Education Topics Provided Wound Debridement: Handouts: Wound Debridement Methods: Explain/Verbal Responses: State content correctly Wound/Skin Impairment: Handouts: Caring for Your Ulcer KAMAURY, GIGLIOTTI ([id number]) [phone].pdf Page 6 of 7 Methods: Explain/Verbal Responses: State content correctly Electronic Signature(s) Signed: 03/13/2023 4:58:56 PM By: Angelina Pih Entered By: Angelina Pih on 03/13/2023 16:57:31 -------------------------------------------------------------------------------- Wound Assessment Details Patient Name: Date of Service: [name], [name] 03/13/2023 3:00 PM Medical Record Number: [id number] Patient Account Number: [id number] Date of Birth/Sex: Treating RN: [birth year] (77 y.o. [name] Primary Care Jamaria Amborn: [name] Other Clinician: Referring Mordecai Tindol: Treating Vincent Streater/Extender: [name] in Treatment: 15 Wound Status Wound Number: 5 Primary Pressure Ulcer Etiology: Wound Location: Left Calcaneus Wound Open Wounding Event: Pressure Injury Status: Date Acquired: 10/20/2022 Comorbid Coronary Artery Disease, Hypertension, Type I Diabetes, Weeks Of Treatment: 15 History: Osteoarthritis, Received Chemotherapy Clustered Wound: No Photos Wound Measurements Length: (cm) 1.6 Width: (cm) 1 Depth: (cm) 0.9 Area: (cm) 1.257 Volume: (cm) 1.131 % Reduction in Area: 73.3% % Reduction in Volume: -140.1% Epithelialization: Small (1-33%) Tunneling: No Undermining: No Wound Description Classification:  Category/Stage III Exudate Amount: Medium Exudate Type: Serosanguineous  Exudate Color: red, brown Foul Odor After Cleansing: No Slough/Fibrino Yes Wound Bed Granulation Amount: Medium (34-66%) Exposed Structure Granulation Quality: Red, Pink Fascia Exposed: No Necrotic Amount: Medium (34-66%) Fat Layer (Subcutaneous Tissue) Exposed: Yes Necrotic Quality: Adherent Slough Tendon Exposed: No Muscle Exposed: No Joint Exposed: No Bone Exposed: No Treatment Notes Wound #5 (Calcaneus) Wound Laterality: Left Cleanser Byram Ancillary Kit - 15 Day Supply Discharge Instruction: Use supplies as instructed; Kit contains: (15) Saline Bullets; (15) 3x3 Gauze; 15 pr Gloves Vashe 5.8 (oz) Knierim, Adedamola H ([id number]) [id number] Page 7 of 7 Discharge Instruction: Use vashe 5.8 (oz) as directed Peri-Wound Care Topical Primary Dressing Secondary Dressing Secured With Compression Wrap Compression Stockings Add-Ons Electronic Signature(s) Signed: 03/13/2023 4:58:56 PM By: Angelina Pih Entered By: Angelina Pih on 03/13/2023 15:22:17 -------------------------------------------------------------------------------- Vitals Details Patient Name: Date of Service: [name], [name] 03/13/2023 3:00 PM Medical Record Number: [id number] Patient Account Number: [id number] Date of Birth/Sex: Treating RN: 04-Mar-1946 (77 y.o. [name] Primary Care Kalei Mckillop: [name] Other Clinician: Referring Josseline Reddin: Treating Kenya Shiraishi/Extender: [name] in Treatment: 15 Vital Signs Time Taken: 15:09 Temperature (F): 98 Height (in): 69 Pulse (bpm): 84 Weight (lbs): 190 Respiratory Rate (breaths/min): 18 Body Mass Index (BMI): 28.1 Blood Pressure (mmHg): 117/77 Reference Range: 80 - 120 mg / dl Electronic Signature(s) Signed: 03/13/2023 4:58:56 PM By: Angelina Pih Entered By: Angelina Pih on 03/13/2023 15:12:19

## 2023-03-14 NOTE — Progress Notes (Signed)
[name], [name] ([id number]) [id number] Page 1 of 8 Visit Report for 03/13/2023 Chief Complaint Document Details Patient Name: Date of Service: [name], [name] 03/13/2023 3:00 PM Medical Record Number: [id number] Patient Account Number: [id number] Date of Birth/Sex: Treating RN: January 08, 1946 (77 y.o. [name] Primary Care Provider: [name] Other Clinician: Referring Provider: Treating Provider/Extender: [name] in Treatment: 15 Information Obtained from: Patient Chief Complaint Left heel pressure ulcer Electronic Signature(s) Signed: 03/13/2023 3:25:02 PM By: [name] [name] Entered By: [name] on 03/13/2023 15:25:01 -------------------------------------------------------------------------------- Debridement Details Patient Name: Date of Service: [name], [name] 03/13/2023 3:00 PM Medical Record Number: [id number] Patient Account Number: [id number] Date of Birth/Sex: Treating RN: July 16, 1946 (77 y.o. [name] Primary Care Provider: [name] Other Clinician: Referring Provider: Treating Provider/Extender: [name] in Treatment: 15 Debridement Performed for Assessment: Wound #5 Left Calcaneus Performed By: Physician [name], [name] Debridement Type: Debridement Level of Consciousness (Pre-procedure): Awake and Alert Pre-procedure Verification/Time Out Yes - 15:30 Taken: Pain Control: Lidocaine 4% T opical Solution Percent of Wound Bed Debrided: 100% T Area Debrided (cm): otal 1.26 Tissue and other material debrided: Viable, Non-Viable, Slough, Subcutaneous, Slough Level: Skin/Subcutaneous Tissue Debridement Description: Excisional Instrument: Curette Bleeding: Moderate Hemostasis Achieved: Pressure Response to Treatment: Procedure was tolerated well Level of Consciousness (Post- Awake and Alert procedure): Post Debridement Measurements of Total  Wound Length: (cm) 1.6 Stage: Category/Stage III Width: (cm) 1 Depth: (cm) 0.9 Volume: (cm) 1.131 Character of Wound/Ulcer Post Debridement: Stable Post Procedure Diagnosis Same as Pre-procedure Electronic Signature(s) Signed: 03/13/2023 4:17:31 PM By: [name] [name] Signed: 03/13/2023 4:58:56 PM By: Angelina Pih Entered By: Angelina Pih on 03/13/2023 15:32:33 Enlow, [name] ([id number]) [id number] Page 2 of 8 -------------------------------------------------------------------------------- HPI Details Patient Name: Date of Service: [name], [name] 03/13/2023 3:00 PM Medical Record Number: [id number] Patient Account Number: [id number] Date of Birth/Sex: Treating RN: [birth year] (77 y.o. [name] Primary Care Provider: [name] Other Clinician: Referring Provider: Treating Provider/Extender: [name] in Treatment: 15 History of Present Illness HPI Description: 77 year old male recently seen by his PCP Dr. [name] who saw him for a wound on the right leg which is less tender and he is continuing to use bacitracin and has completed her course of doxycycline. Past medical history of coronary artery disease, diabetes mellitus type 1, hyperlipidemia, hypertension, MI, plantar fascial fibromatosis, pneumonia, status post foot surgery due to trauma on the left side, coronary angioplasty. he is a former smoker and quit in July 1996. last hemoglobin A1c was 7.8 % in April of this year 5/22 2017 -- biopsy of the right lower extremity was sent for pathology and the report is that of a basal cell carcinoma with edges of the biopsy are involved. I have called the patient over the phone( 03/28/16) and discussed the pathology report with him and he is agreeable to see a surgeon for excision and need full. I have also spoken personally to [name], of [facility] surgical and referred the patient for further wide  excision of this lesion and have communicated this to the patient. READMISSION 08/12/18 This is a now 77 year old man with type 1 diabetes. Hemoglobin A1c on 4/29 was 7.9. He is listed as having a history of PAD in the [facility] records although the patient doesn't recall this, doesn't complain of any symptoms of claudication and doesn't believe he has had any prior noninvasive arterial test. He  is a nonsmoker. He was here in 2017 with a wound on his Right lower leg. This was biopsied in the clinic and pathology showed a basal cell carcinoma. He went on to have surgery on this area this is currently where I believe his current wounds are located. He generally bumped the lateral part of his right calf 2 weeks ago and has a superficial abrasion. He also has 2 small open areas on the medial part of the tibia in the same area. Our intake nurse noted a stitch coming out of this which was removed. These of the wound sees actually come to the clinic for. However the more concerning area is an area on the tip of the left great toe. He says that this was initially traumatic and he's been to see [name]. [name] been using what I think is Santyl to the wound tip. This has some depth. ABIs in this clinic were non-obtainable on the right and 1.84 on the left. 08/19/18; the patient had arterial studies that did not show evidence of significant hemodynamically compromising occlusions in either leg. There was mild medial atherosclerosis bilaterally. His resting ABIs were within the normal limits at 1.3 on the right and 1.4 on the left. There was normal posterior and anterior tibial artery waveforms and velocities listed on both sides. The patient arrives with what appears to be a healthy surface over the tip of his left great toe. This is indeed surprising. Still looks somewhat friable however. More concerning is the x-ray that we did that showed erosions of the tuft of the distal phalanx the left  great toe consistent with osteomyelitis. I attempted to pull this x-ray up on colon health Link however I can't open the film to look at this myself. 08/26/18; I reviewed the patient's x-ray and colon helpline. Indeed there is fairly obvious erosion of the tip of his left great toe. The wound was initially a traumatic area hitting it sometime in the middle of night in his kitchen. He is a type I diabetic. I have him on Flagyl and Levaquin that I started last week i.e. 7 days ago. He has an appointment with infectious disease on 09/01/18 09/02/18; the patient was seen by [name] of infectious disease. She did not feel the patient needed IV antibiotics. I do feel he needs further oral antibiotics however. He does not need anymore Flagyl but he does need another 2 weeks of Levaquin. The areas just about closed. 09/16/18; the patient is completing his Levaquin today. There is no open wound on the tip of the toe Readmission: 11-26-2022 upon evaluation today patient presents for initial inspection here in our clinic concerning issues that he has been having after having been hospitalized due to septic arthritis. Subsequently he has a PICC line that is been removed and he has been discharged from infectious disease in that regard. Unfortunately he developed shearing wounds over the gluteal region as well as a pressure ulcer over each heel although they are not looking extremely bad nonetheless they do seem to be evidence of pressure which she sustained while hospitalized. Obviously he was very sick and is still recovering as far as that is concerned. Fortunately there does not appear to be any evidence of active infection systemically which is good news at this point according to Dr. [name]. Patient's wounds also do not appear to be too bad at this point which is good news. Patient does have a history of type 1 diabetes mellitus, peripheral  vascular disease, coronary artery disease, congestive  heart failure, hypertension, and he is on long-term anticoagulant therapy. 12-03-2022 upon evaluation today patient appears to be doing better to some degree in regard to his ulcers on his heels at this point. We are still waiting on the actual appointment with the vascular surgeons but nonetheless in the meantime I do believe that he is making some good progress currently with regard to loosen up the eschar. I did actually feel like that there was some ability for [place] to remove some of the necrotic tissue today and I discussed that with the patient. He is in agreement with that plan. We also can probably see about making an adjustment in the treatment regimen. 12-10-2022 upon evaluation today patient's wounds were really doing about the same. Again he is still awaiting the evaluation with the vascular surgeon. That should have already been done but had to be pushed back unfortunately. He will be seeing them next week on the seventh. I plan to see him following somewhere around the end of the week I think will probably be best. The patient voiced understanding. 12-19-2022 upon evaluation today patient appears to be doing well currently in regard to his wound we did get the results of his arterial studies which show that he has excellent ABIs with no signs of vascular compromise. For that reason I will go ahead and perform some debridement today to clearway the necrotic debris with his Eliquis that I will be too aggressive so this will still be a stepwise process but we are to go ahead and get that started. 12-27-2022 upon evaluation today patient appears to be doing well currently in regard to his wounds. They are actually showing signs of excellent improvement. There is some necrotic tissue noted on the surface of wounds to work on clearing some of that away today also I think that we may switch to [facility] dressing to see how things do going forward. He is in agreement with that plan. Subsequently I  am extremely pleased with where we stand currently. No fevers, chills, nausea, vomiting, or diarrhea. 01-02-2023 upon evaluation today patient appears to be doing much better in regard to his wounds the right foot appears to be almost healed the left foot though not completely healed is significantly better. Fortunately there does not appear to be any signs of active infection at this point. 01-10-2023 upon evaluation today patient appears to be doing well currently in regard to his wounds. The right heel actually is completely healed the left heel is definitely headed in the right direction and looks to be doing awesome. I do not see any signs of active infection locally nor systemically at this time which is great news. [name], [name] ([id number]) [id number] Page 3 of 8 3/7; his right heel has remained healed. There is no evidence of active infection. The remaining wound is on the lateral aspect of the heel just above the plantar surface 01-30-2023 upon evaluation today patient appears to be doing well currently in regard to his wound. The heel actually showing signs of improvement which is great news and fortunately I do not see any evidence of active infection locally nor systemically at this time. Overall I think that we are headed in the right direction which is excellent here. No fevers, chills, nausea, vomiting, or diarrhea. I am good have to perform some sharp debridement today. This is just the left heel that is remaining open. 02-06-2023 upon evaluation today patient appears to  be doing well currently in regard to his wound. Has been tolerating the dressing changes without complication. Fortunately there does not appear to be any signs of active infection locally nor systemically although he does have some need for sharp debridement with some definite necrotic tissue at the base of the wound today. 02-13-2023 upon evaluation today patient appears to be doing  well currently in regard to his wound which I feel like is slowly clearing up. I did review his growth labs as well the sed rate and C-reactive protein are down his white blood cell count was normal his hemoglobin is coming up. He did see Dr. Joylene Draft on Tuesday and she did continue him on the doxycycline considering his lab numbers but nonetheless he seems to be making some pretty good progress here in my opinion. The wound is slowly turnaround serial debridements are obviously helping with that being said I do believe that he is going to still require dressing changes and close monitoring. 02-20-2023 upon evaluation today patient's wound actually appears to be making some slight progress. This is slowly but surely moving into the right direction. Fortunately I do not see any signs of active infection at this time. 02-27-2023 upon evaluation today patient appears to be doing well currently in regard to his wound. He has been tolerating the dressing changes. I think the Hydrofera Blue is doing a good job and he seems to be doing well with that currently. 03-06-2023 upon evaluation today patient appears to be doing well currently in regard to his wounds were actually seeing signs of improvement which is great news. Fortunately I do not see any evidence of active infection locally nor systemically which is great news and overall I do believe that we are moving in the right direction. 03-13-2023 upon evaluation today patient's wound is actually showing signs of improvement. We actually do have a snap VAC with the bridge today and I am hopeful that this will actually be beneficial for him. Will give this a shot and see how things do. Electronic Signature(s) Signed: 03/13/2023 4:11:46 PM By: [name] [name] Entered By: [name] on 03/13/2023 16:11:45 -------------------------------------------------------------------------------- Physical Exam Details Patient Name: Date of Service: [name], [name]  UDE H. 03/13/2023 3:00 PM Medical Record Number: [id number] Patient Account Number: [id number] Date of Birth/Sex: Treating RN: December 16, 1945 (77 y.o. [name] Primary Care Provider: [name] Other Clinician: Referring Provider: Treating Provider/Extender: [name] in Treatment: 15 Constitutional Well-nourished and well-hydrated in no acute distress. Respiratory normal breathing without difficulty. Psychiatric this patient is able to make decisions and demonstrates good insight into disease process. Alert and Oriented x 3. pleasant and cooperative. Notes Upon inspection patient's wound bed actually showed signs of the need for sharp debridement I did perform debridement clearway some of the necrotic debris he tolerated this today without complication and postdebridement the wound bed appears to be doing much better which is great news. I do think that we are ready for the snap VAC application I am hopeful this will help this area to feeling much more effectively and quickly. Electronic Signature(s) Signed: 03/13/2023 4:12:02 PM By: [name] [name] Entered By: [name] on 03/13/2023 16:12:02 -------------------------------------------------------------------------------- Physician Orders Details Patient Name: Date of Service: [name], [name] 03/13/2023 3:00 PM Medical Record Number: [id number] Patient Account Number: [id number] Date of Birth/Sex: Treating RN: 13-Jun-1946 (77 y.o. [name] Primary Care Provider: [name] Other Clinician: Referring Provider: Treating Provider/Extender: Claudia Pollock  Weeks in Treatment: 15 Verbal / Phone Orders: No [name], [name] ([id number]) [id number] Page 4 of 8 Diagnosis Coding ICD-10 Coding Code Description E10.621 Type 1 diabetes mellitus with foot ulcer I73.89 Other specified peripheral vascular diseases L89.623 Pressure ulcer of left  heel, stage 3 L89.613 Pressure ulcer of right heel, stage 3 L98.412 Non-pressure chronic ulcer of buttock with fat layer exposed I25.10 Atherosclerotic heart disease of native coronary artery without angina pectoris I50.42 Chronic combined systolic (congestive) and diastolic (congestive) heart failure I10 Essential (primary) hypertension Follow-up Appointments Return Appointment in 1 week. Nurse Visit as needed Home Health DISCONTINUE Home Health for Wound Care. - Patient no longer needs home health wound care Bathing/ Shower/ Hygiene May shower; gently cleanse wound with antibacterial soap, rinse and pat dry prior to dressing wounds No tub bath. - no soaking foot in tub Anesthetic (Use 'Patient Medications' Section for Anesthetic Order Entry) Lidocaine applied to wound bed Edema Control - Lymphedema / Segmental Compressive Device / Other Elevate, Exercise Daily and A void Standing for Long Periods of Time. Elevate legs to the level of the heart and pump ankles as often as possible Elevate leg(s) parallel to the floor when sitting. DO YOUR BEST to sleep in the bed at night. DO NOT sleep in your recliner. Long hours of sitting in a recliner leads to swelling of the legs and/or potential wounds on your backside. Negative Pressure Wound Therapy Snap Vac applied - left calc, with bridge applied Wound Treatment Wound #5 - Calcaneus Wound Laterality: Left Cleanser: Byram Ancillary Kit - 15 Day Supply (Generic) 1 x Per Day/30 Days Discharge Instructions: Use supplies as instructed; Kit contains: (15) Saline Bullets; (15) 3x3 Gauze; 15 pr Gloves Cleanser: Vashe 5.8 (oz) 1 x Per Day/30 Days Discharge Instructions: Use vashe 5.8 (oz) as directed Electronic Signature(s) Signed: 03/13/2023 4:58:56 PM By: Angelina Pih Signed: 03/14/2023 11:56:30 AM By: [name] [name] Previous Signature: 03/13/2023 4:17:31 PM Version By: [name] [name] Entered By: Angelina Pih on 03/13/2023  16:48:13 -------------------------------------------------------------------------------- Problem List Details Patient Name: Date of Service: [name], [name] 03/13/2023 3:00 PM Medical Record Number: [id number] Patient Account Number: [id number] Date of Birth/Sex: Treating RN: 1946-01-06 (76 y.o. [name] Primary Care Provider: [name] Other Clinician: Referring Provider: Treating Provider/Extender: [name] in Treatment: 15 Active Problems ICD-10 Encounter Code Description Active Date MDM Diagnosis E10.621 Type 1 diabetes mellitus with foot ulcer 11/26/2022 No Yes [name], [name] ([id number]) [id number] Page 5 of 8 I73.89 Other specified peripheral vascular diseases 11/26/2022 No Yes L89.623 Pressure ulcer of left heel, stage 3 11/26/2022 No Yes L89.613 Pressure ulcer of right heel, stage 3 11/26/2022 No Yes L98.412 Non-pressure chronic ulcer of buttock with fat layer exposed 11/26/2022 No Yes I25.10 Atherosclerotic heart disease of native coronary artery without angina pectoris 11/26/2022 No Yes I50.42 Chronic combined systolic (congestive) and diastolic (congestive) heart failure 11/26/2022 No Yes I10 Essential (primary) hypertension 11/26/2022 No Yes Inactive Problems Resolved Problems Electronic Signature(s) Signed: 03/13/2023 3:24:58 PM By: [name] [name] Entered By: [name] on 03/13/2023 15:24:58 -------------------------------------------------------------------------------- Progress Note Details Patient Name: Date of Service: [name], [name] 03/13/2023 3:00 PM Medical Record Number: [id number] Patient Account Number: [id number] Date of Birth/Sex: Treating RN: 1946/07/21 (77 y.o. [name] Primary Care Provider: [name] Other Clinician: Referring Provider: Treating Provider/Extender: [name] in Treatment: 15 Subjective Chief  Complaint Information obtained from Patient Left heel pressure ulcer History of Present Illness (  HPI) 77 year old male recently seen by his PCP Dr. [name] who saw him for a wound on the right leg which is less tender and he is continuing to use bacitracin and has completed her course of doxycycline. Past medical history of coronary artery disease, diabetes mellitus type 1, hyperlipidemia, hypertension, MI, plantar fascial fibromatosis, pneumonia, status post foot surgery due to trauma on the left side, coronary angioplasty. he is a former smoker and quit in July 1996. last hemoglobin A1c was 7.8 % in April of this year 5/22 2017 -- biopsy of the right lower extremity was sent for pathology and the report is that of a basal cell carcinoma with edges of the biopsy are involved. I have called the patient over the phone( 03/28/16) and discussed the pathology report with him and he is agreeable to see a surgeon for excision and need full. I have also spoken personally to [name], of [facility] surgical and referred the patient for further wide excision of this lesion and have communicated this to the patient. READMISSION 08/12/18 This is a now 77 year old man with type 1 diabetes. Hemoglobin A1c on 4/29 was 7.9. He is listed as having a history of PAD in the [facility] records although the patient doesn't recall this, doesn't complain of any symptoms of claudication and doesn't believe he has had any prior noninvasive arterial test. He is a nonsmoker. He was here in 2017 with a wound on his Right lower leg. This was biopsied in the clinic and pathology showed a basal cell carcinoma. He went on to have surgery on this area this is currently where I believe his current wounds are located. He generally bumped the lateral part of his right calf 2 weeks ago and has a superficial abrasion. He also has 2 small open areas on the medial part of the tibia in the same area. Our intake nurse noted a  stitch coming out of this which was removed. These of the wound sees actually come to the clinic for. However the more concerning area is an area on the tip of the left great toe. He says that this was initially traumatic and he's been to see [name] Ambrosino, Taylor H ([id number]) [id number] Page 6 of 8 podiatrist. [name] been using what I think is Santyl to the wound tip. This has some depth. ABIs in this clinic were non-obtainable on the right and 1.84 on the left. 08/19/18; the patient had arterial studies that did not show evidence of significant hemodynamically compromising occlusions in either leg. There was mild medial atherosclerosis bilaterally. His resting ABIs were within the normal limits at 1.3 on the right and 1.4 on the left. There was normal posterior and anterior tibial artery waveforms and velocities listed on both sides. The patient arrives with what appears to be a healthy surface over the tip of his left great toe. This is indeed surprising. Still looks somewhat friable however. More concerning is the x-ray that we did that showed erosions of the tuft of the distal phalanx the left great toe consistent with osteomyelitis. I attempted to pull this x-ray up on colon health Link however I can't open the film to look at this myself. 08/26/18; I reviewed the patient's x-ray and colon helpline. Indeed there is fairly obvious erosion of the tip of his left great toe. The wound was initially a traumatic area hitting it sometime in the middle of night in his kitchen. He is a type I diabetic. I have him on  Flagyl and Levaquin that I started last week i.e. 7 days ago. He has an appointment with infectious disease on 09/01/18 09/02/18; the patient was seen by [name] of infectious disease. She did not feel the patient needed IV antibiotics. I do feel he needs further oral antibiotics however. He does not need anymore Flagyl but he does need another  2 weeks of Levaquin. The areas just about closed. 09/16/18; the patient is completing his Levaquin today. There is no open wound on the tip of the toe Readmission: 11-26-2022 upon evaluation today patient presents for initial inspection here in our clinic concerning issues that he has been having after having been hospitalized due to septic arthritis. Subsequently he has a PICC line that is been removed and he has been discharged from infectious disease in that regard. Unfortunately he developed shearing wounds over the gluteal region as well as a pressure ulcer over each heel although they are not looking extremely bad nonetheless they do seem to be evidence of pressure which she sustained while hospitalized. Obviously he was very sick and is still recovering as far as that is concerned. Fortunately there does not appear to be any evidence of active infection systemically which is good news at this point according to Dr. [name]. Patient's wounds also do not appear to be too bad at this point which is good news. Patient does have a history of type 1 diabetes mellitus, peripheral vascular disease, coronary artery disease, congestive heart failure, hypertension, and he is on long-term anticoagulant therapy. 12-03-2022 upon evaluation today patient appears to be doing better to some degree in regard to his ulcers on his heels at this point. We are still waiting on the actual appointment with the vascular surgeons but nonetheless in the meantime I do believe that he is making some good progress currently with regard to loosen up the eschar. I did actually feel like that there was some ability for [place] to remove some of the necrotic tissue today and I discussed that with the patient. He is in agreement with that plan. We also can probably see about making an adjustment in the treatment regimen. 12-10-2022 upon evaluation today patient's wounds were really doing about the same. Again he is still awaiting the  evaluation with the vascular surgeon. That should have already been done but had to be pushed back unfortunately. He will be seeing them next week on the seventh. I plan to see him following somewhere around the end of the week I think will probably be best. The patient voiced understanding. 12-19-2022 upon evaluation today patient appears to be doing well currently in regard to his wound we did get the results of his arterial studies which show that he has excellent ABIs with no signs of vascular compromise. For that reason I will go ahead and perform some debridement today to clearway the necrotic debris with his Eliquis that I will be too aggressive so this will still be a stepwise process but we are to go ahead and get that started. 12-27-2022 upon evaluation today patient appears to be doing well currently in regard to his wounds. They are actually showing signs of excellent improvement. There is some necrotic tissue noted on the surface of wounds to work on clearing some of that away today also I think that we may switch to [facility] dressing to see how things do going forward. He is in agreement with that plan. Subsequently I am extremely pleased with where we stand currently. No  fevers, chills, nausea, vomiting, or diarrhea. 01-02-2023 upon evaluation today patient appears to be doing much better in regard to his wounds the right foot appears to be almost healed the left foot though not completely healed is significantly better. Fortunately there does not appear to be any signs of active infection at this point. 01-10-2023 upon evaluation today patient appears to be doing well currently in regard to his wounds. The right heel actually is completely healed the left heel is definitely headed in the right direction and looks to be doing awesome. I do not see any signs of active infection locally nor systemically at this time which is great news. 3/7; his right heel has remained healed. There is no  evidence of active infection. The remaining wound is on the lateral aspect of the heel just above the plantar surface 01-30-2023 upon evaluation today patient appears to be doing well currently in regard to his wound. The heel actually showing signs of improvement which is great news and fortunately I do not see any evidence of active infection locally nor systemically at this time. Overall I think that we are headed in the right direction which is excellent here. No fevers, chills, nausea, vomiting, or diarrhea. I am good have to perform some sharp debridement today. This is just the left heel that is remaining open. 02-06-2023 upon evaluation today patient appears to be doing well currently in regard to his wound. Has been tolerating the dressing changes without complication. Fortunately there does not appear to be any signs of active infection locally nor systemically although he does have some need for sharp debridement with some definite necrotic tissue at the base of the wound today. 02-13-2023 upon evaluation today patient appears to be doing well currently in regard to his wound which I feel like is slowly clearing up. I did review his growth labs as well the sed rate and C-reactive protein are down his white blood cell count was normal his hemoglobin is coming up. He did see Dr. Joylene Draft on Tuesday and she did continue him on the doxycycline considering his lab numbers but nonetheless he seems to be making some pretty good progress here in my opinion. The wound is slowly turnaround serial debridements are obviously helping with that being said I do believe that he is going to still require dressing changes and close monitoring. 02-20-2023 upon evaluation today patient's wound actually appears to be making some slight progress. This is slowly but surely moving into the right direction. Fortunately I do not see any signs of active infection at this time. 02-27-2023 upon evaluation today patient  appears to be doing well currently in regard to his wound. He has been tolerating the dressing changes. I think the Hydrofera Blue is doing a good job and he seems to be doing well with that currently. 03-06-2023 upon evaluation today patient appears to be doing well currently in regard to his wounds were actually seeing signs of improvement which is great news. Fortunately I do not see any evidence of active infection locally nor systemically which is great news and overall I do believe that we are moving in the right direction. 03-13-2023 upon evaluation today patient's wound is actually showing signs of improvement. We actually do have a snap VAC with the bridge today and I am hopeful that this will actually be beneficial for him. Will give this a shot and see how things do. [name], [name] ([id number]) [id number] Page 7 of 8 Objective Constitutional Well-nourished and  well-hydrated in no acute distress. Vitals Time Taken: 3:09 PM, Height: 69 in, Weight: 190 lbs, BMI: 28.1, Temperature: 98 F, Pulse: 84 bpm, Respiratory Rate: 18 breaths/min, Blood Pressure: 117/77 mmHg. Respiratory normal breathing without difficulty. Psychiatric this patient is able to make decisions and demonstrates good insight into disease process. Alert and Oriented x 3. pleasant and cooperative. General Notes: Upon inspection patient's wound bed actually showed signs of the need for sharp debridement I did perform debridement clearway some of the necrotic debris he tolerated this today without complication and postdebridement the wound bed appears to be doing much better which is great news. I do think that we are ready for the snap VAC application I am hopeful this will help this area to feeling much more effectively and quickly. Integumentary (Hair, Skin) Wound #5 status is Open. Original cause of wound was Pressure Injury. The date acquired was: 10/20/2022. The wound has been in  treatment 15 weeks. The wound is located on the Left Calcaneus. The wound measures 1.6cm length x 1cm width x 0.9cm depth; 1.257cm^2 area and 1.131cm^3 volume. There is Fat Layer (Subcutaneous Tissue) exposed. There is no tunneling or undermining noted. There is a medium amount of serosanguineous drainage noted. There is medium (34-66%) red, pink granulation within the wound bed. There is a medium (34-66%) amount of necrotic tissue within the wound bed including Adherent Slough. 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 There was a Excisional Skin/Subcutaneous Tissue Debridement with a total area of 1.26 sq cm performed by [name], [name]. With the following instrument(s): Curette to remove Viable and Non-Viable tissue/material. Material removed includes Subcutaneous Tissue and Slough and after achieving pain control using Lidocaine 4% T opical Solution. No specimens were taken. A time out was conducted at 15:30, prior to the start of the procedure. A Moderate amount of bleeding was controlled with Pressure. The procedure was tolerated well. Post Debridement Measurements: 1.6cm length x 1cm width x 0.9cm depth; 1.131cm^3 volume. Post debridement Stage noted as Category/Stage III. Character of Wound/Ulcer Post Debridement is stable. Post procedure Diagnosis Wound #5: Same as Pre-Procedure Plan Follow-up Appointments: Return Appointment in 1 week. Nurse Visit as needed Home  Health: DISCONTINUE Home Health for Wound Care. - Patient no longer needs home health wound care Bathing/ Shower/ Hygiene: May shower; gently cleanse wound with antibacterial soap, rinse and pat dry prior to dressing wounds No tub bath. - no soaking foot in tub Anesthetic (Use 'Patient Medications' Section for Anesthetic Order Entry): Lidocaine applied to wound bed Edema Control - Lymphedema / Segmental Compressive Device / Other: Elevate, Exercise Daily and Avoid Standing for Long Periods of Time. Elevate legs to the level of the heart and pump ankles as often as possible Elevate leg(s) parallel to the floor when sitting. DO YOUR BEST to sleep in the bed at night. DO NOT sleep in your recliner. Long hours of sitting in a recliner leads to swelling of the legs and/or potential wounds on your backside. WOUND #5: - Calcaneus Wound Laterality: Left Cleanser: [employer] Ancillary Kit - 15 Day Supply (Generic) 1  x Per Day/30 Days [name], [name] ([id number]) [id number] Page 8 of 8 Discharge Instructions: Use supplies as instructed; Kit contains: (15) Saline Bullets; (15) 3x3 Gauze; 15 pr Gloves Prim Dressing: snap vac 1 x Per Day/30 Days ary 1. I would recommend that we have the patient continue to monitor for any signs of infection or worsening. Based on what I am seeing I do believe that we are making good progress here. 2. I am also going to suggest the patient should continue with the snap VAC for the next week he is going to have this on for the week and we will change it in 1 week's time. If he has any issues or complications with that he should contact the office and let us know. 3. I would recommend as well that he should continue to elevate his legs, using Tubigrip instead of his compression sock as this will allow [place] to maintain compression while at the same time being able to cut a small hole to let the tubing come out so it will not be compressing against  the skin which we definitely do not want. We will see patient back for reevaluation in 1 week here in the clinic. If anything worsens or changes patient will contact our office for additional recommendations. Electronic Signature(s) Signed: 03/13/2023 4:12:46 PM By: [name] [name] Entered By: [name] on 03/13/2023 16:12:45 -------------------------------------------------------------------------------- SuperBill Details Patient Name: Date of Service: [name], [name] 03/13/2023 Medical Record Number: [id number] Patient Account Number: [id number] Date of Birth/Sex: Treating RN: 1946/08/14 (77 y.o. [name] Primary Care Provider: [name] Other Clinician: Referring Provider: Treating Provider/Extender: [name] in Treatment: 15 Diagnosis Coding ICD-10 Codes Code Description E10.621 Type 1 diabetes mellitus with foot ulcer I73.89 Other specified peripheral vascular diseases L89.623 Pressure ulcer of left heel, stage 3 L89.613 Pressure ulcer of right heel, stage 3 L98.412 Non-pressure chronic ulcer of buttock with fat layer exposed I25.10 Atherosclerotic heart disease of native coronary artery without angina pectoris I50.42 Chronic combined systolic (congestive) and diastolic (congestive) heart failure I10 Essential (primary) hypertension Facility Procedures : CPT4 Code: [id number] Description: 11042 - DEB SUBQ TISSUE 20 SQ CM/< ICD-10 Diagnosis Description L89.623 Pressure ulcer of left heel, stage 3 Modifier: Quantity: 1 Physician Procedures : CPT4 Code Description Modifier [id number] 11042 - WC PHYS SUBQ TISS 20 SQ CM ICD-10 Diagnosis Description L89.623 Pressure ulcer of left heel, stage 3 Quantity: 1 Electronic Signature(s) Signed: 03/13/2023 4:13:01 PM By: [name] [name] Entered By: [name] on 03/13/2023 16:13:01

## 2023-03-17 ENCOUNTER — Encounter: Payer: Medicare HMO | Admitting: Physician Assistant

## 2023-03-17 DIAGNOSIS — E1051 Type 1 diabetes mellitus with diabetic peripheral angiopathy without gangrene: Secondary | ICD-10-CM | POA: Diagnosis not present

## 2023-03-17 DIAGNOSIS — I5042 Chronic combined systolic (congestive) and diastolic (congestive) heart failure: Secondary | ICD-10-CM | POA: Diagnosis not present

## 2023-03-17 DIAGNOSIS — E10622 Type 1 diabetes mellitus with other skin ulcer: Secondary | ICD-10-CM | POA: Diagnosis not present

## 2023-03-17 DIAGNOSIS — I2510 Atherosclerotic heart disease of native coronary artery without angina pectoris: Secondary | ICD-10-CM | POA: Diagnosis not present

## 2023-03-17 DIAGNOSIS — L98412 Non-pressure chronic ulcer of buttock with fat layer exposed: Secondary | ICD-10-CM | POA: Diagnosis not present

## 2023-03-17 DIAGNOSIS — L89623 Pressure ulcer of left heel, stage 3: Secondary | ICD-10-CM | POA: Diagnosis not present

## 2023-03-17 DIAGNOSIS — L89613 Pressure ulcer of right heel, stage 3: Secondary | ICD-10-CM | POA: Diagnosis not present

## 2023-03-17 DIAGNOSIS — I110 Hypertensive heart disease with heart failure: Secondary | ICD-10-CM | POA: Diagnosis not present

## 2023-03-19 NOTE — Progress Notes (Signed)
TENNIS, FAHEY ([id number]) [id number] Page 1 of 8 Visit Report for 03/17/2023 Arrival Information Details Patient Name: Date of Service: [name], [name] 03/17/2023 2:00 PM Medical Record Number: [id number] Patient Account Number: [id number] Date of Birth/Sex: Treating RN: Apr 13, 1946 (77 y.o. [name]) [name] Primary Care Prather Failla: [name] Other Clinician: Referring Timothee Gali: Treating Evangelene Vora/Extender: [name] in Treatment: 15 Visit Information History Since Last Visit Added or deleted any medications: No Patient Arrived: Ambulatory Any new allergies or adverse reactions: No Arrival Time: 14:03 Had a fall or experienced change in No Accompanied By: partner activities of daily living that may affect Transfer Assistance: None risk of falls: Patient Identification Verified: Yes Signs or symptoms of abuse/neglect since last visito No Secondary Verification Process Completed: Yes Hospitalized since last visit: No Patient Requires Transmission-Based Precautions: No Implantable device outside of the clinic excluding No Patient Has Alerts: Yes cellular tissue based products placed in the center Patient Alerts: Type I Diabetic since last visit: eliquis Has Dressing in Place as Prescribed: Yes ABI/TBI normal see scan Pain Present Now: No Electronic Signature(s) Signed: 03/19/2023 9:53:20 AM By: [name] RN Entered By: [name] on 03/17/2023 14:04:17 -------------------------------------------------------------------------------- Clinic Level of Care Assessment Details Patient Name: Date of Service: [name], [name] 03/17/2023 2:00 PM Medical Record Number: [id number] Patient Account Number: [id number] Date of Birth/Sex: Treating RN: 03-31-1946 (77 y.o. [name]) [name] Primary Care Jazziel Fitzsimmons: [name] Other Clinician: Referring Mariena Meares: Treating Daelen Belvedere/Extender: [name]  in Treatment: 15 Clinic Level of Care Assessment Items TOOL 4 Quantity Score X- 1 0 Use when only an EandM is performed on FOLLOW-UP visit ASSESSMENTS - Nursing Assessment / Reassessment X- 1 10 Reassessment of Co-morbidities (includes updates in patient status) X- 1 5 Reassessment of Adherence to Treatment Plan ASSESSMENTS - Wound and Skin A ssessment / Reassessment X - Simple Wound Assessment / Reassessment - one wound 1 5 []  - 0 Complex Wound Assessment / Reassessment - multiple wounds []  - 0 Dermatologic / Skin Assessment (not related to wound area) ASSESSMENTS - Focused Assessment []  - 0 Circumferential Edema Measurements - multi extremities []  - 0 Nutritional Assessment / Counseling / Intervention []  - 0 Lower Extremity Assessment (monofilament, tuning fork, pulses) []  - 0 Peripheral Arterial Disease Assessment (using hand held doppler) ASSESSMENTS - Ostomy and/or Continence Assessment and Care []  - 0 Incontinence Assessment and Management []  - 0 Ostomy Care Assessment and Management (repouching, etc.) PROCESS - Coordination of Care X - Simple Patient / Family Education for ongoing care 1 15 [name], [name] ([id number]) [id number] Page 2 of 8 []  - 0 Complex (extensive) Patient / Family Education for ongoing care []  - 0 Staff obtains Chiropractor, Records, T Results / Process Orders est []  - 0 Staff telephones HHA, Nursing Homes / Clarify orders / etc []  - 0 Routine Transfer to another Facility (non-emergent condition) []  - 0 Routine Hospital Admission (non-emergent condition) []  - 0 New Admissions / Manufacturing engineer / Ordering NPWT Apligraf, etc. , []  - 0 Emergency Hospital Admission (emergent condition) X- 1 10 Simple Discharge Coordination []  - 0 Complex (extensive) Discharge Coordination PROCESS - Special Needs []  - 0 Pediatric / Minor Patient Management []  - 0 Isolation Patient Management []  - 0 Hearing /  Language / Visual special needs []  - 0 Assessment of Community assistance (transportation, D/C planning, etc.) []  - 0 Additional assistance / Altered mentation []  - 0 Support Surface(s) Assessment (bed, cushion, seat, etc.)  INTERVENTIONS - Wound Cleansing / Measurement X - Simple Wound Cleansing - one wound 1 5 []  - 0 Complex Wound Cleansing - multiple wounds X- 1 5 Wound Imaging (photographs - any number of wounds) []  - 0 Wound Tracing (instead of photographs) X- 1 5 Simple Wound Measurement - one wound []  - 0 Complex Wound Measurement - multiple wounds INTERVENTIONS - Wound Dressings X - Small Wound Dressing one or multiple wounds 1 10 []  - 0 Medium Wound Dressing one or multiple wounds []  - 0 Large Wound Dressing one or multiple wounds []  - 0 Application of Medications - topical []  - 0 Application of Medications - injection INTERVENTIONS - Miscellaneous []  - 0 External ear exam []  - 0 Specimen Collection (cultures, biopsies, blood, body fluids, etc.) []  - 0 Specimen(s) / Culture(s) sent or taken to Lab for analysis []  - 0 Patient Transfer (multiple staff / Nurse, adult / Similar devices) []  - 0 Simple Staple / Suture removal (25 or less) []  - 0 Complex Staple / Suture removal (26 or more) []  - 0 Hypo / Hyperglycemic Management (close monitor of Blood Glucose) []  - 0 Ankle / Brachial Index (ABI) - do not check if billed separately X- 1 5 Vital Signs Has the patient been seen at the hospital within the last three years: Yes Total Score: 75 Level Of Care: New/Established - Level 2 Electronic Signature(s) Signed: 03/19/2023 9:53:20 AM By: [name] RN Entered By: [name] on 03/17/2023 14:42:23 [name], [name] ([id number]) [id number] Page 3 of 8 -------------------------------------------------------------------------------- Encounter Discharge Information Details Patient Name: Date of Service: [name], [name]  03/17/2023 2:00 PM Medical Record Number: [id number] Patient Account Number: [id number] Date of Birth/Sex: Treating RN: 1946/02/24 (77 y.o. [name]) [name] Primary Care Rivers Hamrick: [name] Other Clinician: Referring Ashtan Girtman: Treating Nakhi Choi/Extender: [name] in Treatment: 15 Encounter Discharge Information Items Discharge Condition: Stable Ambulatory Status: Ambulatory Discharge Destination: Home Transportation: Private Auto Accompanied By: self Schedule Follow-up Appointment: Yes Clinical Summary of Care: Electronic Signature(s) Signed: 03/17/2023 2:43:07 PM By: [name] RN Entered By: [name] on 03/17/2023 14:43:07 -------------------------------------------------------------------------------- Lower Extremity Assessment Details Patient Name: Date of Service: [name], [name] 03/17/2023 2:00 PM Medical Record Number: [id number] Patient Account Number: [id number] Date of Birth/Sex: Treating RN: 02-10-1946 (77 y.o. [name]) [name] Primary Care Krizia Flight: [name] Other Clinician: Referring Riyan Haile: Treating Tamana Hatfield/Extender: [name] in Treatment: 15 Edema Assessment Assessed: [Left: No] [Right: No] Edema: [Left: Ye] [Right: s] Calf Left: Right: Point of Measurement: 34 cm From Medial Instep 35 cm Ankle Left: Right: Point of Measurement: 11 cm From Medial Instep 29 cm Electronic Signature(s) Signed: 03/19/2023 9:53:20 AM By: [name] RN Entered By: [name] on 03/17/2023 14:06:51 -------------------------------------------------------------------------------- Multi Wound Chart Details Patient Name: Date of Service: [name], [name] 03/17/2023 2:00 PM Medical Record Number: [id number] Patient Account Number: [id number] Date of Birth/Sex: Treating RN: May 04, 1946 (76 y.o. [name] Primary Care Jarold Macomber: [name] Other Clinician: Referring Mouhamad Teed: Treating  Khiya Friese/Extender: [name] in Treatment: 15 Vital Signs Height(in): 69 Pulse(bpm): 88 Weight(lbs): 190 Blood Pressure(mmHg): 122/110 Body Mass Index(BMI): 28.1 Temperature(F): 97.6 [name], [name] ([id number]) [id number] Page 4 of 8 Respiratory Rate(breaths/min): 20 [5:Photos:] [N/A:N/A] Left Calcaneus N/A N/A Wound Location: Pressure Injury N/A N/A Wounding Event: Pressure Ulcer N/A N/A Primary Etiology: Coronary Artery Disease, N/A N/A Comorbid History: Hypertension, Type I Diabetes, Osteoarthritis, Received Chemotherapy 10/20/2022 N/A N/A Date Acquired:  15 N/A N/A Weeks of Treatment: Open N/A N/A Wound Status: No N/A N/A Wound Recurrence: 1.7x1x1.2 N/A N/A Measurements L x W x D (cm) 1.335 N/A N/A A (cm) : rea 1.602 N/A N/A Volume (cm) : 71.70% N/A N/A % Reduction in A rea: -240.10% N/A N/A % Reduction in Volume: Category/Stage III N/A N/A Classification: Large N/A N/A Exudate A mount: Serosanguineous N/A N/A Exudate Type: red, brown N/A N/A Exudate Color: Medium (34-66%) N/A N/A Granulation A mount: Red, Pink N/A N/A Granulation Quality: Medium (34-66%) N/A N/A Necrotic A mount: Fat Layer (Subcutaneous Tissue): Yes N/A N/A Exposed Structures: Fascia: No Tendon: No Muscle: No Joint: No Bone: No Small (1-33%) N/A N/A Epithelialization: Treatment Notes Electronic Signature(s) Signed: 03/19/2023 9:53:20 AM By: [name] RN Entered By: [name] on 03/17/2023 14:08:30 -------------------------------------------------------------------------------- Multi-Disciplinary Care Plan Details Patient Name: Date of Service: [name], [name] 03/17/2023 2:00 PM Medical Record Number: [id number] Patient Account Number: [id number] Date of Birth/Sex: Treating RN: 1946-06-12 (77 y.o. [name]) [name] Primary Care Khori Underberg: [name] Other Clinician: Referring Ankit Degregorio: Treating  Handsome Anglin/Extender: [name] in Treatment: 15 Active Inactive Pressure Nursing Diagnoses: Potential for impaired tissue integrity related to pressure, friction, moisture, and shear Goals: Patient will remain free of pressure ulcers Date Initiated: 11/26/2022 Target Resolution Date: 03/18/2023 Goal Status: Active Interventions: [name], [name] ([id number]) [id number] Page 5 of 8 Assess: immobility, friction, shearing, incontinence upon admission and as needed Assess offloading mechanisms upon admission and as needed Assess potential for pressure ulcer upon admission and as needed Notes: Wound/Skin Impairment Nursing Diagnoses: Knowledge deficit related to ulceration/compromised skin integrity Goals: Patient/caregiver will verbalize understanding of skin care regimen Date Initiated: 11/26/2022 Date Inactivated: 01/16/2023 Target Resolution Date: 12/03/2022 Goal Status: Met Ulcer/skin breakdown will have a volume reduction of 30% by week 4 Date Initiated: 11/26/2022 Date Inactivated: 02/13/2023 Target Resolution Date: 12/24/2022 Goal Status: Met Ulcer/skin breakdown will have a volume reduction of 50% by week 8 Date Initiated: 11/26/2022 Date Inactivated: 02/13/2023 Target Resolution Date: 01/21/2023 Goal Status: Unmet Unmet Reason: underlying conditions Ulcer/skin breakdown will have a volume reduction of 80% by week 12 Date Initiated: 11/26/2022 Date Inactivated: 02/27/2023 Target Resolution Date: 02/27/2023 Goal Status: Unmet Unmet Reason: underlying condition Ulcer/skin breakdown will heal within 14 weeks Date Initiated: 11/26/2022 Target Resolution Date: 03/18/2023 Goal Status: Active Interventions: Assess patient/caregiver ability to obtain necessary supplies Assess patient/caregiver ability to perform ulcer/skin care regimen upon admission and as needed Assess ulceration(s) every visit Notes: week 4 30% healing met for right  calcaneus but not met for left calcaneus. Electronic Signature(s) Signed: 03/19/2023 9:53:20 AM By: [name] RN Entered By: [name] on 03/17/2023 14:07:53 -------------------------------------------------------------------------------- Pain Assessment Details Patient Name: Date of Service: [name], [name] 03/17/2023 2:00 PM Medical Record Number: [id number] Patient Account Number: [id number] Date of Birth/Sex: Treating RN: [birth year] (76 y.o. [name] Primary Care [place] Warriner: [name] Other Clinician: Referring Diksha Tagliaferro: Treating Ulyana Pitones/Extender: [name] in Treatment: 15 Active Problems Location of Pain Severity and Description of Pain Patient Has Paino No Site Locations [name], [name] ([id number]) [id number] Page 6 of 8 Pain Management and Medication Current Pain Management: Electronic Signature(s) Signed: 03/19/2023 9:53:20 AM By: [name] RN Entered By: [name] on 03/17/2023 14:04:48 -------------------------------------------------------------------------------- Patient/Caregiver Education Details Patient Name: Date of Service: [name], [name] 5/6/2024andnbsp2:00 PM Medical Record Number: [id number] Patient Account Number: [id number] Date of Birth/Gender: Treating RN: 18-Jul-1946 (77 y.o. [name]) Jettie Pagan, Lyla Son  Primary Care Physician: [name] Other Clinician: Referring Physician: Treating Physician/Extender: [name] in Treatment: 15 Education Assessment Education Provided To: Patient Education Topics Provided Wound/Skin Impairment: Handouts: Caring for Your Ulcer Methods: Explain/Verbal Responses: State content correctly Electronic Signature(s) Signed: 03/19/2023 9:53:20 AM By: [name] RN Entered By: [name] on 03/17/2023 14:08:13 -------------------------------------------------------------------------------- Wound Assessment  Details Patient Name: Date of Service: [name], [name] 03/17/2023 2:00 PM Medical Record Number: [id number] Patient Account Number: [id number] Date of Birth/Sex: Treating RN: 14-Feb-1946 (77 y.o. [name]) [name] Primary Care Keyon Winnick: [name] Other Clinician: Referring Antwaine Boomhower: Treating Aja Whitehair/Extender: [name] in Treatment: 15 Wound Status Wound Number: 5 Primary Pressure Ulcer Etiology: Wound Location: Left Calcaneus Wound Open Wounding Event: Pressure Injury Status: Date Acquired: 10/20/2022 Comorbid Coronary Artery Disease, Hypertension, Type I Diabetes, Weeks Of Treatment: 15 History: Osteoarthritis, Received Chemotherapy Clustered Wound: No Photos Wound Measurements [name], [name] ([id number]) Length: (cm) 1.7 Width: (cm) 1 Depth: (cm) 1.2 Area: (cm) 1.335 Volume: (cm) 1.602 [id number] Page 7 of 8 % Reduction in Area: 71.7% % Reduction in Volume: -240.1% Epithelialization: Small (1-33%) Tunneling: No Undermining: No Wound Description Classification: Category/Stage III Exudate Amount: Large Exudate Type: Serosanguineous Exudate Color: red, brown Foul Odor After Cleansing: No Slough/Fibrino Yes Wound Bed Granulation Amount: Medium (34-66%) Exposed Structure Granulation Quality: Red, Pink Fascia Exposed: No Necrotic Amount: Medium (34-66%) Fat Layer (Subcutaneous Tissue) Exposed: Yes Necrotic Quality: Adherent Slough Tendon Exposed: No Muscle Exposed: No Joint Exposed: No Bone Exposed: No Treatment Notes Wound #5 (Calcaneus) Wound Laterality: Left Cleanser Byram Ancillary Kit - 15 Day Supply Discharge Instruction: Use supplies as instructed; Kit contains: (15) Saline Bullets; (15) 3x3 Gauze; 15 pr Gloves Vashe 5.8 (oz) Discharge Instruction: Use vashe 5.8 (oz) as directed Peri-Wound Care Topical Primary Dressing Hydrofera Blue Ready Transfer Foam, 2.5x2.5 (in/in) Discharge  Instruction: Apply Hydrofera Blue Ready to wound bed as directed Secondary Dressing Gauze (BORDER) Zetuvit Plus SILICONE BORDER Dressing 4x4 (in/in) Discharge Instruction: Please do not put silicone bordered dressings under wraps. Use non-bordered dressing only. Secured With Compression Wrap Compression Stockings Facilities manager) Signed: 03/19/2023 9:53:20 AM By: [name] RN Entered By: [name] on 03/17/2023 14:05:59 -------------------------------------------------------------------------------- Vitals Details Patient Name: Date of Service: [name], [name] 03/17/2023 2:00 PM Medical Record Number: [id number] Patient Account Number: [id number] Date of Birth/Sex: Treating RN: 1946-09-23 (77 y.o. [name]) [name] Primary Care Melida Northington: [name] Other Clinician: Referring Gauge Winski: Treating Helaman Mecca/Extender: [name] in Treatment: 15 Vital Signs Time Taken: 14:04 Temperature (F): 97.6 Height (in): 69 Pulse (bpm): 88 Weight (lbs): 190 Respiratory Rate (breaths/min): 20 Body Mass Index (BMI): 28.1 Blood Pressure (mmHg): 122/110 Reference Range: 80 - 120 mg / dl [name], [name] ([id number]) [id number] Page 8 of 8 Electronic Signature(s) Signed: 03/19/2023 9:53:20 AM By: [name] RN Entered By: [name] on 03/17/2023 14:04:42

## 2023-03-19 NOTE — Progress Notes (Signed)
IZACC, DORSETT ([id number]) [id number] Page 1 of 8 Visit Report for 03/17/2023 Chief Complaint Document Details Patient Name: Date of Service: [name], [name] 03/17/2023 2:00 PM Medical Record Number: [id number] Patient Account Number: [id number] Date of Birth/Sex: Treating RN: October 24, 1946 (76 y.o. [name]) [name] Primary Care Provider: [name] Other Clinician: Referring Provider: Treating Provider/Extender: [name] in Treatment: 15 Information Obtained from: Patient Chief Complaint Left heel pressure ulcer Electronic Signature(s) Signed: 03/17/2023 2:19:35 PM By: [name] [name] Entered By: [name] on 03/17/2023 14:19:34 -------------------------------------------------------------------------------- HPI Details Patient Name: Date of Service: [name], [name] 03/17/2023 2:00 PM Medical Record Number: [id number] Patient Account Number: [id number] Date of Birth/Sex: Treating RN: 1946-02-04 (76 y.o. [name]) [name] Primary Care Provider: [name] Other Clinician: Referring Provider: Treating Provider/Extender: [name] in Treatment: 15 History of Present Illness HPI Description: 77 year old male recently seen by his PCP Dr. [name] who saw him for a wound on the right leg which is less tender and he is continuing to use bacitracin and has completed her course of doxycycline. Past medical history of coronary artery disease, diabetes mellitus type 1, hyperlipidemia, hypertension, MI, plantar fascial fibromatosis, pneumonia, status post foot surgery due to trauma on the left side, coronary angioplasty. he is a former smoker and quit in July 1996. last hemoglobin A1c was 7.8 % in April of this year 5/22 2017 -- biopsy of the right lower extremity was sent for pathology and the report is that of a basal cell carcinoma with edges of the biopsy are involved. I have called the  patient over the phone( 03/28/16) and discussed the pathology report with him and he is agreeable to see a surgeon for excision and need full. I have also spoken personally to [name], of [facility] surgical and referred the patient for further wide excision of this lesion and have communicated this to the patient. READMISSION 08/12/18 This is a now 77 year old man with type 1 diabetes. Hemoglobin A1c on 4/29 was 7.9. He is listed as having a history of PAD in the [facility] records although the patient doesn't recall this, doesn't complain of any symptoms of claudication and doesn't believe he has had any prior noninvasive arterial test. He is a nonsmoker. He was here in 2017 with a wound on his Right lower leg. This was biopsied in the clinic and pathology showed a basal cell carcinoma. He went on to have surgery on this area this is currently where I believe his current wounds are located. He generally bumped the lateral part of his right calf 2 weeks ago and has a superficial abrasion. He also has 2 small open areas on the medial part of the tibia in the same area. Our intake nurse noted a stitch coming out of this which was removed. These of the wound sees actually come to the clinic for. However the more concerning area is an area on the tip of the left great toe. He says that this was initially traumatic and he's been to see [name]. [name] been using what I think is Santyl to the wound tip. This has some depth. ABIs in this clinic were non-obtainable on the right and 1.84 on the left. 08/19/18; the patient had arterial studies that did not show evidence of significant hemodynamically compromising occlusions in either leg. There was mild medial atherosclerosis bilaterally. His resting ABIs were within the normal limits at 1.3 on the right and  1.4 on the left. There was normal posterior and anterior tibial artery waveforms and velocities listed on both sides. The patient arrives with  what appears to be a healthy surface over the tip of his left great toe. This is indeed surprising. Still looks somewhat friable however. More concerning is the x-ray that we did that showed erosions of the tuft of the distal phalanx the left great toe consistent with osteomyelitis. I attempted to pull this x-ray up on colon health Link however I can't open the film to look at this myself. 08/26/18; I reviewed the patient's x-ray and colon helpline. Indeed there is fairly obvious erosion of the tip of his left great toe. The wound was initially a traumatic area hitting it sometime in the middle of night in his kitchen. He is a type I diabetic. I have him on Flagyl and Levaquin that I started last week i.e. 7 days ago. He has an appointment with infectious disease on 09/01/18 09/02/18; the patient was seen by [name] of infectious disease. She did not feel the patient needed IV antibiotics. I do feel he needs further oral antibiotics however. He does not need anymore Flagyl but he does need another 2 weeks of Levaquin. The areas just about closed. 09/16/18; the patient is completing his Levaquin today. There is no open wound on the tip of the toe Readmission: KIPLEY, CAMMON ([id number]) [id number] Page 2 of 8 11-26-2022 upon evaluation today patient presents for initial inspection here in our clinic concerning issues that he has been having after having been hospitalized due to septic arthritis. Subsequently he has a PICC line that is been removed and he has been discharged from infectious disease in that regard. Unfortunately he developed shearing wounds over the gluteal region as well as a pressure ulcer over each heel although they are not looking extremely bad nonetheless they do seem to be evidence of pressure which she sustained while hospitalized. Obviously he was very sick and is still recovering as far as that is concerned. Fortunately there does not  appear to be any evidence of active infection systemically which is good news at this point according to Dr. [name]. Patient's wounds also do not appear to be too bad at this point which is good news. Patient does have a history of type 1 diabetes mellitus, peripheral vascular disease, coronary artery disease, congestive heart failure, hypertension, and he is on long-term anticoagulant therapy. 12-03-2022 upon evaluation today patient appears to be doing better to some degree in regard to his ulcers on his heels at this point. We are still waiting on the actual appointment with the vascular surgeons but nonetheless in the meantime I do believe that he is making some good progress currently with regard to loosen up the eschar. I did actually feel like that there was some ability for [place] to remove some of the necrotic tissue today and I discussed that with the patient. He is in agreement with that plan. We also can probably see about making an adjustment in the treatment regimen. 12-10-2022 upon evaluation today patient's wounds were really doing about the same. Again he is still awaiting the evaluation with the vascular surgeon. That should have already been done but had to be pushed back unfortunately. He will be seeing them next week on the seventh. I plan to see him following somewhere around the end of the week I think will probably be best. The patient voiced understanding. 12-19-2022 upon evaluation today patient appears to be  doing well currently in regard to his wound we did get the results of his arterial studies which show that he has excellent ABIs with no signs of vascular compromise. For that reason I will go ahead and perform some debridement today to clearway the necrotic debris with his Eliquis that I will be too aggressive so this will still be a stepwise process but we are to go ahead and get that started. 12-27-2022 upon evaluation today patient appears to be doing well currently in  regard to his wounds. They are actually showing signs of excellent improvement. There is some necrotic tissue noted on the surface of wounds to work on clearing some of that away today also I think that we may switch to [facility] dressing to see how things do going forward. He is in agreement with that plan. Subsequently I am extremely pleased with where we stand currently. No fevers, chills, nausea, vomiting, or diarrhea. 01-02-2023 upon evaluation today patient appears to be doing much better in regard to his wounds the right foot appears to be almost healed the left foot though not completely healed is significantly better. Fortunately there does not appear to be any signs of active infection at this point. 01-10-2023 upon evaluation today patient appears to be doing well currently in regard to his wounds. The right heel actually is completely healed the left heel is definitely headed in the right direction and looks to be doing awesome. I do not see any signs of active infection locally nor systemically at this time which is great news. 3/7; his right heel has remained healed. There is no evidence of active infection. The remaining wound is on the lateral aspect of the heel just above the plantar surface 01-30-2023 upon evaluation today patient appears to be doing well currently in regard to his wound. The heel actually showing signs of improvement which is great news and fortunately I do not see any evidence of active infection locally nor systemically at this time. Overall I think that we are headed in the right direction which is excellent here. No fevers, chills, nausea, vomiting, or diarrhea. I am good have to perform some sharp debridement today. This is just the left heel that is remaining open. 02-06-2023 upon evaluation today patient appears to be doing well currently in regard to his wound. Has been tolerating the dressing changes without complication. Fortunately there does not appear  to be any signs of active infection locally nor systemically although he does have some need for sharp debridement with some definite necrotic tissue at the base of the wound today. 02-13-2023 upon evaluation today patient appears to be doing well currently in regard to his wound which I feel like is slowly clearing up. I did review his growth labs as well the sed rate and C-reactive protein are down his white blood cell count was normal his hemoglobin is coming up. He did see Dr. Joylene Draft on Tuesday and she did continue him on the doxycycline considering his lab numbers but nonetheless he seems to be making some pretty good progress here in my opinion. The wound is slowly turnaround serial debridements are obviously helping with that being said I do believe that he is going to still require dressing changes and close monitoring. 02-20-2023 upon evaluation today patient's wound actually appears to be making some slight progress. This is slowly but surely moving into the right direction. Fortunately I do not see any signs of active infection at this time. 02-27-2023 upon evaluation  today patient appears to be doing well currently in regard to his wound. He has been tolerating the dressing changes. I think the Hydrofera Blue is doing a good job and he seems to be doing well with that currently. 03-06-2023 upon evaluation today patient appears to be doing well currently in regard to his wounds were actually seeing signs of improvement which is great news. Fortunately I do not see any evidence of active infection locally nor systemically which is great news and overall I do believe that we are moving in the right direction. 03-13-2023 upon evaluation today patient's wound is actually showing signs of improvement. We actually do have a snap VAC with the bridge today and I am hopeful that this will actually be beneficial for him. Will give this a shot and see how things do. 03-17-2023 upon evaluation today  patient comes in early due to having issues with the snap VAC will need to not maintain a seal. Subsequently we did end up seeing an and removing the snap VAC due to the fact that there is a lot of maceration and drainage around the wound itself. Upon removal however the wound appears to be doing excellent and in fact was in much better shape that even when I saw her last Thursday. Electronic Signature(s) Signed: 03/17/2023 5:53:14 PM By: [name] [name] Entered By: [name] on 03/17/2023 17:53:14 -------------------------------------------------------------------------------- Physical Exam Details Patient Name: Date of Service: [name], [name] 03/17/2023 2:00 PM Medical Record Number: [id number] Patient Account Number: [id number] Date of Birth/Sex: Treating RN: Aug 15, 1946 (76 y.o. [name]) [name] Primary Care Provider: [name] Other Clinician: Referring Provider: Treating Provider/Extender: [name] in Treatment: [address], [place] H ([id number]) [id number] Page 3 of 8 Constitutional Well-nourished and well-hydrated in no acute distress. Respiratory normal breathing without difficulty. Psychiatric this patient is able to make decisions and demonstrates good insight into disease process. Alert and Oriented x 3. pleasant and cooperative. Notes Upon inspection patient's wound bed actually appears to be doing physically and visually much better than what I saw last Thursday. I am very pleased in that regard. With that being said I do believe the snap VAC was helpful but I believe that he has a lot of swelling in the leg which may be attributing to the issues with the maceration or seeing around the snap VAC. I would like to try to give this another shot but we need to let it dry out a little bit before we put it back on. Electronic Signature(s) Signed: 03/17/2023 5:53:40 PM By: [name] [name] Entered By: [name] on  03/17/2023 17:53:40 -------------------------------------------------------------------------------- Physician Orders Details Patient Name: Date of Service: [name], [name] 03/17/2023 2:00 PM Medical Record Number: [id number] Patient Account Number: [id number] Date of Birth/Sex: Treating RN: [birth year] (76 y.o. [name] Primary Care Provider: [name] Other Clinician: Referring Provider: Treating Provider/Extender: [name] in Treatment: 15 Verbal / Phone Orders: No Diagnosis Coding Follow-up Appointments Return Appointment in 1 week. Nurse Visit as needed Home Health DISCONTINUE Home Health for Wound Care. - Patient no longer needs home health wound care Bathing/ Shower/ Hygiene May shower; gently cleanse wound with antibacterial soap, rinse and pat dry prior to dressing wounds No tub bath. - no soaking foot in tub Anesthetic (Use 'Patient Medications' Section for Anesthetic Order Entry) Lidocaine applied to wound bed Edema Control - Lymphedema / Segmental Compressive Device / Other Patient to wear own compression stockings.  Remove compression stockings every night before going to bed and put on every morning when getting up. Elevate, Exercise Daily and A void Standing for Long Periods of Time. Elevate legs to the level of the heart and pump ankles as often as possible Elevate leg(s) parallel to the floor when sitting. DO YOUR BEST to sleep in the bed at night. DO NOT sleep in your recliner. Long hours of sitting in a recliner leads to swelling of the legs and/or potential wounds on your backside. Negative Pressure Wound Therapy Snap Vac applied - left calc, with bridge at 154mm/Hg continuous , hold until thursday Wound Treatment Wound #5 - Calcaneus Wound Laterality: Left Cleanser: Byram Ancillary Kit - 15 Day Supply (Generic) Other:leave on until patient comes back on Thursday/30 Days Discharge Instructions: Use supplies as  instructed; Kit contains: (15) Saline Bullets; (15) 3x3 Gauze; 15 pr Gloves Cleanser: Vashe 5.8 (oz) Other:leave on until patient comes back on Thursday/30 Days Discharge Instructions: Use vashe 5.8 (oz) as directed Prim Dressing: Hydrofera Blue Ready Transfer Foam, 2.5x2.5 (in/in) Other:leave on until patient comes back on Thursday/30 Days ary Discharge Instructions: Apply Hydrofera Blue Ready to wound bed as directed Secondary Dressing: Gauze Other:leave on until patient comes back on Thursday/30 Days Secondary Dressing: (BORDER) Zetuvit Plus SILICONE BORDER Dressing 4x4 (in/in) Other:leave on until patient comes back on Thursday/30 Days Discharge Instructions: Please do not put silicone bordered dressings under wraps. Use non-bordered dressing only. Secured With: Tubigrip Size D, 3x10 (in/yd) Other:leave on until patient comes back on Thursday/30 Days Discharge Instructions: single layer then use your own compression stocking when you get home DALLAN, LATZ ([id number]) [id number] Page 4 of 8 Electronic Signature(s) Signed: 03/17/2023 6:09:56 PM By: [name] [name] Signed: 03/19/2023 9:53:20 AM By: [name] RN Entered By: [name] on 03/17/2023 14:50:51 -------------------------------------------------------------------------------- Problem List Details Patient Name: Date of Service: [name], [name] 03/17/2023 2:00 PM Medical Record Number: [id number] Patient Account Number: [id number] Date of Birth/Sex: Treating RN: 03-09-1946 (76 y.o. [name] Primary Care Provider: [name] Other Clinician: Referring Provider: Treating Provider/Extender: [name] in Treatment: 15 Active Problems ICD-10 Encounter Code Description Active Date MDM Diagnosis E10.621 Type 1 diabetes mellitus with foot ulcer 11/26/2022 No Yes I73.89 Other specified peripheral vascular diseases 11/26/2022 No Yes L89.623 Pressure ulcer  of left heel, stage 3 11/26/2022 No Yes L89.613 Pressure ulcer of right heel, stage 3 11/26/2022 No Yes L98.412 Non-pressure chronic ulcer of buttock with fat layer exposed 11/26/2022 No Yes I25.10 Atherosclerotic heart disease of native coronary artery without angina pectoris 11/26/2022 No Yes I50.42 Chronic combined systolic (congestive) and diastolic (congestive) heart failure 11/26/2022 No Yes I10 Essential (primary) hypertension 11/26/2022 No Yes Inactive Problems Resolved Problems Electronic Signature(s) Signed: 03/17/2023 2:19:32 PM By: [name] [name] Entered By: [name] on 03/17/2023 14:19:32 -------------------------------------------------------------------------------- Progress Note Details Patient Name: Date of Service: [name], [name] 03/17/2023 2:00 PM Medical Record Number: [id number] Patient Account Number: [id number] HASHIR, OSTROWSKY ([id number]) [id number] Page 5 of 8 Date of Birth/Sex: Treating RN: [birth year] (76 y.o. [name]) [name] Primary Care Provider: [name] Other Clinician: Referring Provider: Treating Provider/Extender: [name] in Treatment: 15 Subjective Chief Complaint Information obtained from Patient Left heel pressure ulcer History of Present Illness (HPI) 77 year old male recently seen by his PCP Dr. [name] who saw him for a wound on the right leg which is less tender and he is continuing to use bacitracin and  has completed her course of doxycycline. Past medical history of coronary artery disease, diabetes mellitus type 1, hyperlipidemia, hypertension, MI, plantar fascial fibromatosis, pneumonia, status post foot surgery due to trauma on the left side, coronary angioplasty. he is a former smoker and quit in July 1996. last hemoglobin A1c was 7.8 % in April of this year 5/22 2017 -- biopsy of the right lower extremity was sent for pathology and the report is that of a basal  cell carcinoma with edges of the biopsy are involved. I have called the patient over the phone( 03/28/16) and discussed the pathology report with him and he is agreeable to see a surgeon for excision and need full. I have also spoken personally to [name], of [facility] surgical and referred the patient for further wide excision of this lesion and have communicated this to the patient. READMISSION 08/12/18 This is a now 77 year old man with type 1 diabetes. Hemoglobin A1c on 4/29 was 7.9. He is listed as having a history of PAD in the [facility] records although the patient doesn't recall this, doesn't complain of any symptoms of claudication and doesn't believe he has had any prior noninvasive arterial test. He is a nonsmoker. He was here in 2017 with a wound on his Right lower leg. This was biopsied in the clinic and pathology showed a basal cell carcinoma. He went on to have surgery on this area this is currently where I believe his current wounds are located. He generally bumped the lateral part of his right calf 2 weeks ago and has a superficial abrasion. He also has 2 small open areas on the medial part of the tibia in the same area. Our intake nurse noted a stitch coming out of this which was removed. These of the wound sees actually come to the clinic for. However the more concerning area is an area on the tip of the left great toe. He says that this was initially traumatic and he's been to see [name]. [name] been using what I think is Santyl to the wound tip. This has some depth. ABIs in this clinic were non-obtainable on the right and 1.84 on the left. 08/19/18; the patient had arterial studies that did not show evidence of significant hemodynamically compromising occlusions in either leg. There was mild medial atherosclerosis bilaterally. His resting ABIs were within the normal limits at 1.3 on the right and 1.4 on the left. There was normal posterior and anterior tibial artery  waveforms and velocities listed on both sides. The patient arrives with what appears to be a healthy surface over the tip of his left great toe. This is indeed surprising. Still looks somewhat friable however. More concerning is the x-ray that we did that showed erosions of the tuft of the distal phalanx the left great toe consistent with osteomyelitis. I attempted to pull this x-ray up on colon health Link however I can't open the film to look at this myself. 08/26/18; I reviewed the patient's x-ray and colon helpline. Indeed there is fairly obvious erosion of the tip of his left great toe. The wound was initially a traumatic area hitting it sometime in the middle of night in his kitchen. He is a type I diabetic. I have him on Flagyl and Levaquin that I started last week i.e. 7 days ago. He has an appointment with infectious disease on 09/01/18 09/02/18; the patient was seen by [name] of infectious disease. She did not feel the patient needed IV antibiotics.  I do feel he needs further oral antibiotics however. He does not need anymore Flagyl but he does need another 2 weeks of Levaquin. The areas just about closed. 09/16/18; the patient is completing his Levaquin today. There is no open wound on the tip of the toe Readmission: 11-26-2022 upon evaluation today patient presents for initial inspection here in our clinic concerning issues that he has been having after having been hospitalized due to septic arthritis. Subsequently he has a PICC line that is been removed and he has been discharged from infectious disease in that regard. Unfortunately he developed shearing wounds over the gluteal region as well as a pressure ulcer over each heel although they are not looking extremely bad nonetheless they do seem to be evidence of pressure which she sustained while hospitalized. Obviously he was very sick and is still recovering as far as that is concerned. Fortunately there does not appear to be any  evidence of active infection systemically which is good news at this point according to Dr. [name]. Patient's wounds also do not appear to be too bad at this point which is good news. Patient does have a history of type 1 diabetes mellitus, peripheral vascular disease, coronary artery disease, congestive heart failure, hypertension, and he is on long-term anticoagulant therapy. 12-03-2022 upon evaluation today patient appears to be doing better to some degree in regard to his ulcers on his heels at this point. We are still waiting on the actual appointment with the vascular surgeons but nonetheless in the meantime I do believe that he is making some good progress currently with regard to loosen up the eschar. I did actually feel like that there was some ability for [place] to remove some of the necrotic tissue today and I discussed that with the patient. He is in agreement with that plan. We also can probably see about making an adjustment in the treatment regimen. 12-10-2022 upon evaluation today patient's wounds were really doing about the same. Again he is still awaiting the evaluation with the vascular surgeon. That should have already been done but had to be pushed back unfortunately. He will be seeing them next week on the seventh. I plan to see him following somewhere around the end of the week I think will probably be best. The patient voiced understanding. 12-19-2022 upon evaluation today patient appears to be doing well currently in regard to his wound we did get the results of his arterial studies which show that he has excellent ABIs with no signs of vascular compromise. For that reason I will go ahead and perform some debridement today to clearway the necrotic debris with his Eliquis that I will be too aggressive so this will still be a stepwise process but we are to go ahead and get that started. 12-27-2022 upon evaluation today patient appears to be doing well currently in regard to his  wounds. They are actually showing signs of excellent improvement. There is some necrotic tissue noted on the surface of wounds to work on clearing some of that away today also I think that we may switch to [facility] dressing to see how things do going forward. He is in agreement with that plan. Subsequently I am extremely pleased with where we stand currently. No fevers, chills, nausea, vomiting, or diarrhea. 01-02-2023 upon evaluation today patient appears to be doing much better in regard to his wounds the right foot appears to be almost healed the left foot though not completely healed is significantly better. Fortunately  there does not appear to be any signs of active infection at this point. 01-10-2023 upon evaluation today patient appears to be doing well currently in regard to his wounds. The right heel actually is completely healed the left heel is definitely headed in the right direction and looks to be doing awesome. I do not see any signs of active infection locally nor systemically at this time which is great news. JAMOND, CAVEN ([id number]) [id number] Page 6 of 8 3/7; his right heel has remained healed. There is no evidence of active infection. The remaining wound is on the lateral aspect of the heel just above the plantar surface 01-30-2023 upon evaluation today patient appears to be doing well currently in regard to his wound. The heel actually showing signs of improvement which is great news and fortunately I do not see any evidence of active infection locally nor systemically at this time. Overall I think that we are headed in the right direction which is excellent here. No fevers, chills, nausea, vomiting, or diarrhea. I am good have to perform some sharp debridement today. This is just the left heel that is remaining open. 02-06-2023 upon evaluation today patient appears to be doing well currently in regard to his wound. Has been tolerating the  dressing changes without complication. Fortunately there does not appear to be any signs of active infection locally nor systemically although he does have some need for sharp debridement with some definite necrotic tissue at the base of the wound today. 02-13-2023 upon evaluation today patient appears to be doing well currently in regard to his wound which I feel like is slowly clearing up. I did review his growth labs as well the sed rate and C-reactive protein are down his white blood cell count was normal his hemoglobin is coming up. He did see Dr. Joylene Draft on Tuesday and she did continue him on the doxycycline considering his lab numbers but nonetheless he seems to be making some pretty good progress here in my opinion. The wound is slowly turnaround serial debridements are obviously helping with that being said I do believe that he is going to still require dressing changes and close monitoring. 02-20-2023 upon evaluation today patient's wound actually appears to be making some slight progress. This is slowly but surely moving into the right direction. Fortunately I do not see any signs of active infection at this time. 02-27-2023 upon evaluation today patient appears to be doing well currently in regard to his wound. He has been tolerating the dressing changes. I think the Hydrofera Blue is doing a good job and he seems to be doing well with that currently. 03-06-2023 upon evaluation today patient appears to be doing well currently in regard to his wounds were actually seeing signs of improvement which is great news. Fortunately I do not see any evidence of active infection locally nor systemically which is great news and overall I do believe that we are moving in the right direction. 03-13-2023 upon evaluation today patient's wound is actually showing signs of improvement. We actually do have a snap VAC with the bridge today and I am hopeful that this will actually be beneficial for him. Will give  this a shot and see how things do. 03-17-2023 upon evaluation today patient comes in early due to having issues with the snap VAC will need to not maintain a seal. Subsequently we did end up seeing an and removing the snap VAC due to the fact that there is a lot of  maceration and drainage around the wound itself. Upon removal however the wound appears to be doing excellent and in fact was in much better shape that even when I saw her last Thursday. Objective Constitutional Well-nourished and well-hydrated in no acute distress. Vitals Time Taken: 2:04 PM, Height: 69 in, Weight: 190 lbs, BMI: 28.1, Temperature: 97.6 F, Pulse: 88 bpm, Respiratory Rate: 20 breaths/min, Blood Pressure: 122/110 mmHg. Respiratory normal breathing without difficulty. Psychiatric this patient is able to make decisions and demonstrates good insight into disease process. Alert and Oriented x 3. pleasant and cooperative. General Notes: Upon inspection patient's wound bed actually appears to be doing physically and visually much better than what I saw last Thursday. I am very pleased in that regard. With that being said I do believe the snap VAC was helpful but I believe that he has a lot of swelling in the leg which may be attributing to the issues with the maceration or seeing around the snap VAC. I would like to try to give this another shot but we need to let it dry out a little bit before we put it back on. Integumentary (Hair, Skin) Wound #5 status is Open. Original cause of wound was Pressure Injury. The date acquired was: 10/20/2022. The wound has been in treatment 15 weeks. The wound is located on the Left Calcaneus. The wound measures 1.7cm length x 1cm width x 1.2cm depth; 1.335cm^2 area and 1.602cm^3 volume. There is Fat Layer (Subcutaneous Tissue) exposed. There is no tunneling or undermining noted. There is a large amount of serosanguineous drainage noted. There is medium (34-66%) red, pink granulation within  the wound bed. There is a medium (34-66%) amount of necrotic tissue within the wound bed including Adherent Slough. Assessment Active Problems ICD-10 Type 1 diabetes mellitus with foot ulcer Other specified peripheral vascular diseases Pressure ulcer of left heel, stage 3 Pressure ulcer of right heel, stage 3 Non-pressure chronic ulcer of buttock with fat layer exposed Atherosclerotic heart disease of native coronary artery without angina pectoris Chronic combined systolic (congestive) and diastolic (congestive) heart failure Essential (primary) hypertension Enberg, Manolito H ([id number]) [id number] Page 7 of 8 Plan Follow-up Appointments: Return Appointment in 1 week. Nurse Visit as needed Home Health: DISCONTINUE Home Health for Wound Care. - Patient no longer needs home health wound care Bathing/ Shower/ Hygiene: May shower; gently cleanse wound with antibacterial soap, rinse and pat dry prior to dressing wounds No tub bath. - no soaking foot in tub Anesthetic (Use 'Patient Medications' Section for Anesthetic Order Entry): Lidocaine applied to wound bed Edema Control - Lymphedema / Segmental Compressive Device / Other: Patient to wear own compression stockings. Remove compression stockings every night before going to bed and put on every morning when getting up. Elevate, Exercise Daily and Avoid Standing for Long Periods of Time. Elevate legs to the level of the heart and pump ankles as often as possible Elevate leg(s) parallel to the floor when sitting. DO YOUR BEST to sleep in the bed at night. DO NOT sleep in your recliner. Long hours of sitting in a recliner leads to swelling of the legs and/or potential wounds on your backside. Negative Pressure Wound Therapy: Snap Vac applied - left calc, with bridge at 165mm/Hg continuous , hold until thursday WOUND #5: - Calcaneus Wound Laterality: Left Cleanser: Byram Ancillary Kit - 15 Day Supply  (Generic) Other:leave on until patient comes back on Thursday/30 Days Discharge Instructions: Use supplies as instructed; Kit contains: (15) Saline Bullets; (15) 3x3 Gauze;  15 pr Gloves Cleanser: Vashe 5.8 (oz) Other:leave on until patient comes back on Thursday/30 Days Discharge Instructions: Use vashe 5.8 (oz) as directed Prim Dressing: Hydrofera Blue Ready Transfer Foam, 2.5x2.5 (in/in) Other:leave on until patient comes back on Thursday/30 Days ary Discharge Instructions: Apply Hydrofera Blue Ready to wound bed as directed Secondary Dressing: Gauze Other:leave on until patient comes back on Thursday/30 Days Secondary Dressing: (BORDER) Zetuvit Plus SILICONE BORDER Dressing 4x4 (in/in) Other:leave on until patient comes back on Thursday/30 Days Discharge Instructions: Please do not put silicone bordered dressings under wraps. Use non-bordered dressing only. Secured With: Tubigrip Size D, 3x10 (in/yd) Other:leave on until patient comes back on Thursday/30 Days Discharge Instructions: single layer then use your own compression stocking when you get home 1. I would recommend currently based on what we are seeing that we continue with the [facility] between now and when I see him on Thursday. Depending on how things appear we will check into reapplying the snap VAC at that point. 2. I am also can recommend that the patient should continue to try to offload is much as possible he also should have the juxta lite by Wednesday so when we see him on Thursday we will be able to put this on as well to help with edema control. We will see patient back for reevaluation in 1 week here in the clinic. If anything worsens or changes patient will contact our office for additional recommendations. Electronic Signature(s) Signed: 03/17/2023 5:54:09 PM By: [name] [name] Entered By: [name] on 03/17/2023 17:54:08 -------------------------------------------------------------------------------- SuperBill  Details Patient Name: Date of Service: [name], [name] 03/17/2023 Medical Record Number: [id number] Patient Account Number: [id number] Date of Birth/Sex: Treating RN: 1946-10-16 (76 y.o. [name]) [name] Primary Care Provider: [name] Other Clinician: Referring Provider: Treating Provider/Extender: [name] in Treatment: 15 Diagnosis Coding ICD-10 Codes Code Description E10.621 Type 1 diabetes mellitus with foot ulcer I73.89 Other specified peripheral vascular diseases L89.623 Pressure ulcer of left heel, stage 3 L89.613 Pressure ulcer of right heel, stage 3 L98.412 Non-pressure chronic ulcer of buttock with fat layer exposed I25.10 Atherosclerotic heart disease of native coronary artery without angina pectoris I50.42 Chronic combined systolic (congestive) and diastolic (congestive) heart failure I10 Essential (primary) hypertension Facility Procedures KENANIAH, BUSSARD ([id number]): CPT4 Code Description [id number] [phone] - WOUND CARE VISIT-LEV 2 EST PT [id number] Page 8 of 8: Modifier Quantity 1 Physician Procedures : CPT4 Code Description Modifier [phone] [id number] - WC PHYS LEVEL 3 - EST PT ICD-10 Diagnosis Description E10.621 Type 1 diabetes mellitus with foot ulcer I73.89 Other specified peripheral vascular diseases L89.623 Pressure ulcer of left heel, stage 3  L89.613 Pressure ulcer of right heel, stage 3 Quantity: 1 Electronic Signature(s) Signed: 03/17/2023 5:55:12 PM By: [name] [name] Previous Signature: 03/17/2023 2:42:29 PM Version By: [name] RN Entered By: [name] on 03/17/2023 17:55:11

## 2023-03-20 ENCOUNTER — Encounter: Payer: Medicare HMO | Admitting: Physician Assistant

## 2023-03-20 DIAGNOSIS — E10622 Type 1 diabetes mellitus with other skin ulcer: Secondary | ICD-10-CM | POA: Diagnosis not present

## 2023-03-20 DIAGNOSIS — L89623 Pressure ulcer of left heel, stage 3: Secondary | ICD-10-CM | POA: Diagnosis not present

## 2023-03-20 DIAGNOSIS — L89613 Pressure ulcer of right heel, stage 3: Secondary | ICD-10-CM | POA: Diagnosis not present

## 2023-03-20 DIAGNOSIS — I2510 Atherosclerotic heart disease of native coronary artery without angina pectoris: Secondary | ICD-10-CM | POA: Diagnosis not present

## 2023-03-20 DIAGNOSIS — L98412 Non-pressure chronic ulcer of buttock with fat layer exposed: Secondary | ICD-10-CM | POA: Diagnosis not present

## 2023-03-20 DIAGNOSIS — E1051 Type 1 diabetes mellitus with diabetic peripheral angiopathy without gangrene: Secondary | ICD-10-CM | POA: Diagnosis not present

## 2023-03-20 DIAGNOSIS — I5042 Chronic combined systolic (congestive) and diastolic (congestive) heart failure: Secondary | ICD-10-CM | POA: Diagnosis not present

## 2023-03-20 DIAGNOSIS — I110 Hypertensive heart disease with heart failure: Secondary | ICD-10-CM | POA: Diagnosis not present

## 2023-03-20 NOTE — Progress Notes (Addendum)
ASBERRY, HELMICK ([id number]) [id number] Page 1 of 8 Visit Report for 03/20/2023 Chief Complaint Document Details Patient Name: Date of Service: [name], [name] 03/20/2023 1:00 PM Medical Record Number: [id number] Patient Account Number: [id number] Date of Birth/Sex: Treating RN: 10-Sep-1946 (77 y.o. [name] Primary Care Provider: Jerl [name] Other Clinician: Referring Provider: Treating Provider/Extender: [name] in Treatment: 16 Information Obtained from: Patient Chief Complaint Left heel pressure ulcer Electronic Signature(s) Signed: 03/20/2023 1:06:11 PM By: [name] Derry [name] Entered By: [name] on 03/20/2023 13:06:10 -------------------------------------------------------------------------------- HPI Details Patient Name: Date of Service: [name], [name] 03/20/2023 1:00 PM Medical Record Number: [id number] Patient Account Number: [id number] Date of Birth/Sex: Treating RN: 03-03-1946 (77 y.o. [name] Primary Care Provider: Jerl [name] Other Clinician: Referring Provider: Treating Provider/Extender: [name] in Treatment: 16 History of Present Illness HPI Description: 77 year old male recently seen by his PCP Dr. Mickel [name] who saw him for a wound on the right leg which is less tender and he is continuing to use bacitracin and has completed her course of doxycycline. Past medical history of coronary artery disease, diabetes mellitus type 1, hyperlipidemia, hypertension, MI, plantar fascial fibromatosis, pneumonia, status post foot surgery due to trauma on the left side, coronary angioplasty. he is a former smoker and quit in July 1996. last hemoglobin A1c was 7.8 % in April of this year 5/22 2017 -- biopsy of the right lower extremity was sent for pathology and the report is that of a basal cell carcinoma with edges of the biopsy are involved. I have  called the patient over the phone( 03/28/16) and discussed the pathology report with him and he is agreeable to see a surgeon for excision and need full. I have also spoken personally to [name], of [facility] surgical and referred the patient for further wide excision of this lesion and have communicated this to the patient. READMISSION 08/12/18 This is a now 77 year old man with type 1 diabetes. Hemoglobin A1c on 4/29 was 7.9. He is listed as having a history of PAD in the [place] records although the patient doesn't recall this, doesn't complain of any symptoms of claudication and doesn't believe he has had any prior noninvasive arterial test. He is a nonsmoker. He was here in 2017 with a wound on his Right lower leg. This was biopsied in the clinic and pathology showed a basal cell carcinoma. He went on to have surgery on this area this is currently where I believe his current wounds are located. He generally bumped the lateral part of his right calf 2 weeks ago and has a superficial abrasion. He also has 2 small open areas on the medial part of the tibia in the same area. Our intake nurse noted a stitch coming out of this which was removed. These of the wound sees actually come to the clinic for. However the more concerning area is an area on the tip of the left great toe. He says that this was initially traumatic and he's been to see [name] podiatrist. [name] been using what I think is Santyl to the wound tip. This has some depth. ABIs in this clinic were non-obtainable on the right and 1.84 on the left. 08/19/18; the patient had arterial studies that did not show evidence of significant hemodynamically compromising occlusions in either leg. There was mild medial atherosclerosis bilaterally. His resting ABIs were within the normal limits at 1.3 on the right and  1.4 on the left. There was normal posterior and anterior tibial artery waveforms and velocities listed on both sides. The patient  arrives with what appears to be a healthy surface over the tip of his left great toe. This is indeed surprising. Still looks somewhat friable however. More concerning is the x-ray that we did that showed erosions of the tuft of the distal phalanx the left great toe consistent with osteomyelitis. I attempted to pull this x-ray up on colon health Link however I can't open the film to look at this myself. 08/26/18; I reviewed the patient's x-ray and colon helpline. Indeed there is fairly obvious erosion of the tip of his left great toe. The wound was initially a traumatic area hitting it sometime in the middle of night in his kitchen. He is a type I diabetic. I have him on Flagyl and Levaquin that I started last week i.e. 7 days ago. He has an appointment with infectious disease on 09/01/18 09/02/18; the patient was seen by [name] of infectious disease. She did not feel the patient needed IV antibiotics. I do feel he needs further oral antibiotics however. He does not need anymore Flagyl but he does need another 2 weeks of Levaquin. The areas just about closed. 09/16/18; the patient is completing his Levaquin today. There is no open wound on the tip of the toe Readmission: [name] ([id number]) [id number] Page 2 of 8 11-26-2022 upon evaluation today patient presents for initial inspection here in our clinic concerning issues that he has been having after having been hospitalized due to septic arthritis. Subsequently he has a PICC line that is been removed and he has been discharged from infectious disease in that regard. Unfortunately he developed shearing wounds over the gluteal region as well as a pressure ulcer over each heel although they are not looking extremely bad nonetheless they do seem to be evidence of pressure which she sustained while hospitalized. Obviously he was very sick and is still recovering as far as that is concerned. Fortunately  there does not appear to be any evidence of active infection systemically which is good news at this point according to Dr. Judye [name] Patient's wounds also do not appear to be too bad at this point which is good news. Patient does have a history of type 1 diabetes mellitus, peripheral vascular disease, coronary artery disease, congestive heart failure, hypertension, and he is on long-term anticoagulant therapy. 12-03-2022 upon evaluation today patient appears to be doing better to some degree in regard to his ulcers on his heels at this point. We are still waiting on the actual appointment with the vascular surgeons but nonetheless in the meantime I do believe that he is making some good progress currently with regard to loosen up the eschar. I did actually feel like that there was some ability for [place] to remove some of the necrotic tissue today and I discussed that with the patient. He is in agreement with that plan. We also can probably see about making an adjustment in the treatment regimen. 12-10-2022 upon evaluation today patient's wounds were really doing about the same. Again he is still awaiting the evaluation with the vascular surgeon. That should have already been done but had to be pushed back unfortunately. He will be seeing them next week on the seventh. I plan to see him following somewhere around the end of the week I think will probably be best. The patient voiced understanding. 12-19-2022 upon evaluation today patient appears to be  doing well currently in regard to his wound we did get the results of his arterial studies which show that he has excellent ABIs with no signs of vascular compromise. For that reason I will go ahead and perform some debridement today to clearway the necrotic debris with his Eliquis that I will be too aggressive so this will still be a stepwise process but we are to go ahead and get that started. 12-27-2022 upon evaluation today patient appears to be doing well  currently in regard to his wounds. They are actually showing signs of excellent improvement. There is some necrotic tissue noted on the surface of wounds to work on clearing some of that away today also I think that we may switch to [facility] dressing to see how things do going forward. He is in agreement with that plan. Subsequently I am extremely pleased with where we stand currently. No fevers, chills, nausea, vomiting, or diarrhea. 01-02-2023 upon evaluation today patient appears to be doing much better in regard to his wounds the right foot appears to be almost healed the left foot though not completely healed is significantly better. Fortunately there does not appear to be any signs of active infection at this point. 01-10-2023 upon evaluation today patient appears to be doing well currently in regard to his wounds. The right heel actually is completely healed the left heel is definitely headed in the right direction and looks to be doing awesome. I do not see any signs of active infection locally nor systemically at this time which is great news. 3/7; his right heel has remained healed. There is no evidence of active infection. The remaining wound is on the lateral aspect of the heel just above the plantar surface 01-30-2023 upon evaluation today patient appears to be doing well currently in regard to his wound. The heel actually showing signs of improvement which is great news and fortunately I do not see any evidence of active infection locally nor systemically at this time. Overall I think that we are headed in the right direction which is excellent here. No fevers, chills, nausea, vomiting, or diarrhea. I am good have to perform some sharp debridement today. This is just the left heel that is remaining open. 02-06-2023 upon evaluation today patient appears to be doing well currently in regard to his wound. Has been tolerating the dressing changes without complication. Fortunately there  does not appear to be any signs of active infection locally nor systemically although he does have some need for sharp debridement with some definite necrotic tissue at the base of the wound today. 02-13-2023 upon evaluation today patient appears to be doing well currently in regard to his wound which I feel like is slowly clearing up. I did review his growth labs as well the sed rate and C-reactive protein are down his white blood cell count was normal his hemoglobin is coming up. He did see Dr. Joylene Draft on Tuesday and she did continue him on the doxycycline considering his lab numbers but nonetheless he seems to be making some pretty good progress here in my opinion. The wound is slowly turnaround serial debridements are obviously helping with that being said I do believe that he is going to still require dressing changes and close monitoring. 02-20-2023 upon evaluation today patient's wound actually appears to be making some slight progress. This is slowly but surely moving into the right direction. Fortunately I do not see any signs of active infection at this time. 02-27-2023 upon evaluation  today patient appears to be doing well currently in regard to his wound. He has been tolerating the dressing changes. I think the Hydrofera Blue is doing a good job and he seems to be doing well with that currently. 03-06-2023 upon evaluation today patient appears to be doing well currently in regard to his wounds were actually seeing signs of improvement which is great news. Fortunately I do not see any evidence of active infection locally nor systemically which is great news and overall I do believe that we are moving in the right direction. 03-13-2023 upon evaluation today patient's wound is actually showing signs of improvement. We actually do have a snap VAC with the bridge today and I am hopeful that this will actually be beneficial for him. Will give this a shot and see how things do. 03-17-2023 upon  evaluation today patient comes in early due to having issues with the snap VAC will need to not maintain a seal. Subsequently we did end up seeing an and removing the snap VAC due to the fact that there is a lot of maceration and drainage around the wound itself. Upon removal however the wound appears to be doing excellent and in fact was in much better shape that even when I saw her last Thursday. 03-20-2023 upon evaluation today patient appears to be doing well currently in regard to his wound is showing more signs of granulation which is great news and overall I am extremely pleased with where we stand today. I do not see any evidence of active infection locally nor systemically which is great news. No fevers, chills, nausea, vomiting, or diarrhea. Electronic Signature(s) Signed: 03/20/2023 1:50:00 PM By: [name] Derry [name] Entered By: [name] on 03/20/2023 13:50:00 -------------------------------------------------------------------------------- Physical Exam Details Patient Name: Date of Service: [name], [name] 03/20/2023 1:00 PM Medical Record Number: [id number] Patient Account Number: [id number] Date of Birth/Sex: Treating RN: Mar 31, 1946 (76 y.o. [name] Primary Care Provider: Jerl [name] Other Clinician: Referring Provider: Treating Provider/Extender: Landis, Varon ([id number]) [id number] Page 3 of 8 Weeks in Treatment: 16 Constitutional Well-nourished and well-hydrated in no acute distress. Respiratory normal breathing without difficulty. Psychiatric this patient is able to make decisions and demonstrates good insight into disease process. Alert and Oriented x 3. pleasant and cooperative. Notes Upon inspection patient's wound bed showed evidence of good granulation and epithelization at this point. Fortunately I do not see any evidence of worsening in general and I feel like that the patient is  making really good progress here. I would recommend that we go ahead and see about getting him started with the reapplication of the snap VAC today this does not appear to be too macerated is dried out since Tuesday which is great news. I am hopeful this will continue to allow good granulation in. He does have the juxta light as well which I think should help keep edema under control. Electronic Signature(s) Signed: 03/20/2023 1:50:37 PM By: [name] Derry [name] Entered By: [name] on 03/20/2023 13:50:37 -------------------------------------------------------------------------------- Physician Orders Details Patient Name: Date of Service: [name], [name] 03/20/2023 1:00 PM Medical Record Number: [id number] Patient Account Number: [id number] Date of Birth/Sex: Treating RN: [birth year] (77 y.o. 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 Nurse Visit as needed Home Health DISCONTINUE Home Health for Wound Care. - Patient no longer needs home health wound care Bathing/ Shower/ Hygiene May shower; gently cleanse wound with antibacterial soap, rinse and pat dry prior to dressing wounds No tub bath. - no soaking foot in tub Anesthetic (Use 'Patient Medications' Section  for Anesthetic Order Entry) Lidocaine applied to wound bed Edema Control - Lymphedema / Segmental Compressive Device / Other Patient to wear own compression stockings. Remove compression stockings every night before going to bed and put on every morning when getting up. - pt has juxtafit velcro wrap Elevate, Exercise Daily and A void Standing for Long Periods of Time. Elevate legs to the level of the heart and pump ankles as often as possible Elevate leg(s) parallel to the floor when sitting. DO YOUR BEST to sleep in the bed at night. DO NOT sleep in your recliner. Long hours of sitting in a recliner leads to swelling of the legs and/or potential wounds on your backside. Negative Pressure Wound Therapy Snap Vac applied - left calc, with bridge at 197mm/Hg continuous Wound Treatment [name], [name] ([id number]) [id number] Page 4 of 8 Wound #5 - Calcaneus Wound Laterality: Left Cleanser: Byram Ancillary Kit - 15 Day Supply (Generic) Other:leave on until patient comes back on Thursday/30 Days Discharge Instructions: Use supplies as instructed; Kit contains: (15) Saline Bullets; (15) 3x3 Gauze; 15 pr Gloves Cleanser: Vashe 5.8 (oz) Other:leave on until patient comes back on Thursday/30 Days Discharge Instructions: Use vashe 5.8 (oz) as directed Secured With: Tubigrip Size D, 3x10 (in/yd) Other:leave on until patient comes back on Thursday/30 Days Discharge Instructions: single layer, fold back to place double layer on foot Electronic Signature(s) Signed: 03/20/2023 2:59:05 PM By: Angelina Pih Signed: 03/20/2023 4:04:13 PM By: [name] Derry [name] Entered By: Angelina Pih on 03/20/2023 14:59:05 -------------------------------------------------------------------------------- Problem List Details Patient Name: Date of Service: [name], [name] 03/20/2023 1:00 PM Medical Record Number: [id number] Patient Account Number: [id number] Date of Birth/Sex: Treating  RN: August 23, 1946 (77 y.o. [name] Primary Care Provider: Jerl [name] Other Clinician: Referring Provider: Treating Provider/Extender: [name] in Treatment: 16 Active Problems ICD-10 Encounter Code Description Active Date MDM Diagnosis E10.621 Type 1 diabetes mellitus with foot ulcer 11/26/2022 No Yes I73.89 Other specified peripheral vascular diseases 11/26/2022 No Yes L89.623 Pressure ulcer of left heel, stage 3 11/26/2022 No Yes L89.613 Pressure ulcer of right heel, stage 3 11/26/2022 No Yes L98.412 Non-pressure chronic ulcer of buttock with fat layer exposed 11/26/2022 No Yes I25.10 Atherosclerotic heart disease of native coronary artery without angina pectoris 11/26/2022 No Yes I50.42 Chronic combined systolic (congestive) and diastolic (congestive) heart failure 11/26/2022 No Yes I10 Essential (primary) hypertension 11/26/2022 No Yes Inactive Problems Resolved Problems Electronic Signature(s) Signed: 03/20/2023 1:06:08 PM By: Edwin Dada, Trayquan H ([id number]) [id number] Page 5 of 8 Entered By: [name] on 03/20/2023 13:06:07 -------------------------------------------------------------------------------- Progress Note Details Patient Name: Date of Service: [name], [name] 03/20/2023 1:00 PM Medical Record Number: [id number] Patient Account Number: [id number] Date of Birth/Sex: Treating RN: Sep 14, 1946 (77 y.o. [name] Primary Care Provider: Jerl [name] Other  Clinician: Referring Provider: Treating Provider/Extender: [name] in Treatment: 16 Subjective Chief Complaint Information obtained from Patient Left heel pressure ulcer History of Present Illness (HPI) 77 year old male recently seen by his PCP Dr. Mickel [name] who saw him for a wound on the right leg which is less tender and he is continuing to use bacitracin and has completed her course of  doxycycline. Past medical history of coronary artery disease, diabetes mellitus type 1, hyperlipidemia, hypertension, MI, plantar fascial fibromatosis, pneumonia, status post foot surgery due to trauma on the left side, coronary angioplasty. he is a former smoker and quit in July 1996. last hemoglobin A1c was 7.8 % in April of this year 5/22 2017 -- biopsy of the right lower extremity was sent for pathology and the report is that of a basal cell carcinoma with edges of the biopsy are involved. I have called the patient over the phone( 03/28/16) and discussed the pathology report with him and he is agreeable to see a surgeon for excision and need full. I have also spoken personally to [name], of [facility] surgical and referred the patient for further wide excision of this lesion and have communicated this to the patient. READMISSION 08/12/18 This is a now 77 year old man with type 1 diabetes. Hemoglobin A1c on 4/29 was 7.9. He is listed as having a history of PAD in the [place] records although the patient doesn't recall this, doesn't complain of any symptoms of claudication and doesn't believe he has had any prior noninvasive arterial test. He is a nonsmoker. He was here in 2017 with a wound on his Right lower leg. This was biopsied in the clinic and pathology showed a basal cell carcinoma. He went on to have surgery on this area this is currently where I believe his current wounds are located. He generally bumped the lateral part of his right calf 2 weeks ago and has a superficial abrasion. He also has 2 small open areas on the medial part of the tibia in the same area. Our intake nurse noted a stitch coming out of this which was removed. These of the wound sees actually come to the clinic for. However the more concerning area is an area on the tip of the left great toe. He says that this was initially traumatic and he's been to see [name] podiatrist. [name] been using what I think is Santyl  to the wound tip. This has some depth. ABIs in this clinic were non-obtainable on the right and 1.84 on the left. 08/19/18; the patient had arterial studies that did not show evidence of significant hemodynamically compromising occlusions in either leg. There was mild medial atherosclerosis bilaterally. His resting ABIs were within the normal limits at 1.3 on the right and 1.4 on the left. There was normal posterior and anterior tibial artery waveforms and velocities listed on both sides. The patient arrives with what appears to be a healthy surface over the tip of his left great toe. This is indeed surprising. Still looks somewhat friable however. More concerning is the x-ray that we did that showed erosions of the tuft of the distal phalanx the left great toe consistent with osteomyelitis. I attempted to pull this x-ray up on colon health Link however I can't open the film to look at this myself. 08/26/18; I reviewed the patient's x-ray and colon helpline. Indeed there is fairly obvious erosion of the tip of his left great toe. The wound was initially a traumatic area hitting  it sometime in the middle of night in his kitchen. He is a type I diabetic. I have him on Flagyl and Levaquin that I started last week i.e. 7 days ago. He has an appointment with infectious disease on 09/01/18 09/02/18; the patient was seen by [name] of infectious disease. She did not feel the patient needed IV antibiotics. I do feel he needs further oral antibiotics however. He does not need anymore Flagyl but he does need another 2 weeks of Levaquin. The areas just about closed. 09/16/18; the patient is completing his Levaquin today. There is no open wound on the tip of the toe Readmission: 11-26-2022 upon evaluation today patient presents for initial inspection here in our clinic concerning issues that he has been having after having been hospitalized due to septic arthritis. Subsequently he has a PICC line that is  been removed and he has been discharged from infectious disease in that regard. Unfortunately he developed shearing wounds over the gluteal region as well as a pressure ulcer over each heel although they are not looking extremely bad nonetheless they do seem to be evidence of pressure which she sustained while hospitalized. Obviously he was very sick and is still recovering as far as that is concerned. Fortunately there does not appear to be any evidence of active infection systemically which is good news at this point according to Dr. Judye [name] Patient's wounds also do not appear to be too bad at this point which is good news. Patient does have a history of type 1 diabetes mellitus, peripheral vascular disease, coronary artery disease, congestive heart failure, hypertension, and he is on long-term anticoagulant therapy. 12-03-2022 upon evaluation today patient appears to be doing better to some degree in regard to his ulcers on his heels at this point. We are still waiting on the actual appointment with the vascular surgeons but nonetheless in the meantime I do believe that he is making some good progress currently with regard to loosen up the eschar. I did actually feel like that there was some ability for [place] to remove some of the necrotic tissue today and I discussed that with the patient. He is in agreement with that plan. We also can probably see about making an adjustment in the treatment regimen. 12-10-2022 upon evaluation today patient's wounds were really doing about the same. Again he is still awaiting the evaluation with the vascular surgeon. That should have already been done but had to be pushed back unfortunately. He will be seeing them next week on the seventh. I plan to see him following somewhere around the end of the week I think will probably be best. The patient voiced understanding. 12-19-2022 upon evaluation today patient appears to be doing well currently in regard to his wound we  did get the results of his arterial studies which show that he has excellent ABIs with no signs of vascular compromise. For that reason I will go ahead and perform some debridement today to clearway the necrotic debris with his Eliquis that I will be too aggressive so this will still be a stepwise process but we are to go ahead and get that started. 12-27-2022 upon evaluation today patient appears to be doing well currently in regard to his wounds. They are actually showing signs of excellent improvement. [name], [name] ([id number]) [id number] Page 6 of 8 There is some necrotic tissue noted on the surface of wounds to work on clearing some of that away today also I think that we may switch  to [facility] Blue dressing to see how things do going forward. He is in agreement with that plan. Subsequently I am extremely pleased with where we stand currently. No fevers, chills, nausea, vomiting, or diarrhea. 01-02-2023 upon evaluation today patient appears to be doing much better in regard to his wounds the right foot appears to be almost healed the left foot though not completely healed is significantly better. Fortunately there does not appear to be any signs of active infection at this point. 01-10-2023 upon evaluation today patient appears to be doing well currently in regard to his wounds. The right heel actually is completely healed the left heel is definitely headed in the right direction and looks to be doing awesome. I do not see any signs of active infection locally nor systemically at this time which is great news. 3/7; his right heel has remained healed. There is no evidence of active infection. The remaining wound is on the lateral aspect of the heel just above the plantar surface 01-30-2023 upon evaluation today patient appears to be doing well currently in regard to his wound. The heel actually showing signs of improvement which is great news and fortunately I do  not see any evidence of active infection locally nor systemically at this time. Overall I think that we are headed in the right direction which is excellent here. No fevers, chills, nausea, vomiting, or diarrhea. I am good have to perform some sharp debridement today. This is just the left heel that is remaining open. 02-06-2023 upon evaluation today patient appears to be doing well currently in regard to his wound. Has been tolerating the dressing changes without complication. Fortunately there does not appear to be any signs of active infection locally nor systemically although he does have some need for sharp debridement with some definite necrotic tissue at the base of the wound today. 02-13-2023 upon evaluation today patient appears to be doing well currently in regard to his wound which I feel like is slowly clearing up. I did review his growth labs as well the sed rate and C-reactive protein are down his white blood cell count was normal his hemoglobin is coming up. He did see Dr. Joylene Draft on Tuesday and she did continue him on the doxycycline considering his lab numbers but nonetheless he seems to be making some pretty good progress here in my opinion. The wound is slowly turnaround serial debridements are obviously helping with that being said I do believe that he is going to still require dressing changes and close monitoring. 02-20-2023 upon evaluation today patient's wound actually appears to be making some slight progress. This is slowly but surely moving into the right direction. Fortunately I do not see any signs of active infection at this time. 02-27-2023 upon evaluation today patient appears to be doing well currently in regard to his wound. He has been tolerating the dressing changes. I think the Hydrofera Blue is doing a good job and he seems to be doing well with that currently. 03-06-2023 upon evaluation today patient appears to be doing well currently in regard to his wounds were  actually seeing signs of improvement which is great news. Fortunately I do not see any evidence of active infection locally nor systemically which is great news and overall I do believe that we are moving in the right direction. 03-13-2023 upon evaluation today patient's wound is actually showing signs of improvement. We actually do have a snap VAC with the bridge today and I am hopeful that this  will actually be beneficial for him. Will give this a shot and see how things do. 03-17-2023 upon evaluation today patient comes in early due to having issues with the snap VAC will need to not maintain a seal. Subsequently we did end up seeing an and removing the snap VAC due to the fact that there is a lot of maceration and drainage around the wound itself. Upon removal however the wound appears to be doing excellent and in fact was in much better shape that even when I saw her last Thursday. 03-20-2023 upon evaluation today patient appears to be doing well currently in regard to his wound is showing more signs of granulation which is great news and overall I am extremely pleased with where we stand today. I do not see any evidence of active infection locally nor systemically which is great news. No fevers, chills, nausea, vomiting, or diarrhea. Objective Constitutional Well-nourished and well-hydrated in no acute distress. Vitals Time Taken: 1:10 PM, Height: 69 in, Weight: 190 lbs, BMI: 28.1, Temperature: 97.7 F, Pulse: 87 bpm, Respiratory Rate: 18 breaths/min, Blood Pressure: 127/65 mmHg. Respiratory normal breathing without difficulty. Psychiatric this patient is able to make decisions and demonstrates good insight into disease process. Alert and Oriented x 3. pleasant and cooperative. General Notes: Upon inspection patient's wound bed showed evidence of good granulation and epithelization at this point. Fortunately I do not see any evidence of worsening in general and I feel like that the patient is  making really good progress here. I would recommend that we go ahead and see about getting him started with the reapplication of the snap VAC today this does not appear to be too macerated is dried out since Tuesday which is great news. I am hopeful this will continue to allow good granulation in. He does have the juxta light as well which I think should help keep edema under control. Integumentary (Hair, Skin) Wound #5 status is Open. Original cause of wound was Pressure Injury. The date acquired was: 10/20/2022. The wound has been in treatment 16 weeks. The wound is located on the Left Calcaneus. The wound measures 1.4cm length x 1.4cm width x 1.2cm depth; 1.539cm^2 area and 1.847cm^3 volume. There is Fat Layer (Subcutaneous Tissue) exposed. There is no tunneling or undermining noted. There is a medium amount of serosanguineous drainage noted. There is large (67-100%) red, pink granulation within the wound bed. There is a small (1-33%) amount of necrotic tissue within the wound bed including Adherent Slough. 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 I would recommend currently that we have the patient continue to monitor for any signs of infection or worsening. Will get a use of the snap VAC which I think is appropriate at this point. 2. Also can recommend the patient should continue to elevate his legs much as possible to help with edema control he will also be using the juxta lite compression wrap  which should be helpful regular use the Tubigrip underneath which should also be helpful in my opinion. We will see patient back for reevaluation in 1 week here in the clinic. If anything worsens  or changes patient will contact our office for additional recommendations. Electronic Signature(s) Signed: 03/20/2023 1:51:04 PM By: [name] Derry [name] Entered By: [name] on 03/20/2023 13:51:04 -------------------------------------------------------------------------------- SuperBill Details Patient Name: Date of Service: [name], [name] 03/20/2023 Medical Record Number: [id number] Patient Account Number: [id number] Date of Birth/Sex: Treating RN: 1945/12/09 (77 y.o. [name] Primary Care Provider: Jerl [name] Other Clinician: Referring Provider: Treating Provider/Extender: [name] in Treatment: 16 Diagnosis Coding ICD-10 Codes Code Description E10.621 Type 1 diabetes mellitus with foot ulcer I73.89 Other specified peripheral vascular diseases L89.623 Pressure ulcer of left heel, stage 3 L89.613 Pressure ulcer of right heel, stage 3 L98.412 Non-pressure chronic ulcer of buttock with fat layer exposed I25.10 Atherosclerotic heart disease of native coronary artery without angina pectoris I50.42 Chronic combined systolic (congestive) and diastolic (congestive) heart failure I10 Essential (primary) hypertension Facility Procedures : CPT4 Code: [id number] Description: 08657 Single Use NEG PRESS WND TX <=50 SQ CM Modifier: Quantity: 1 Physician Procedures : CPT4 Code Description Modifier [id number] - WC PHYS LEVEL 4 - EST PT ICD-10 Diagnosis Description E10.621 Type 1 diabetes mellitus with foot ulcer I73.89 Other specified peripheral vascular diseases L89.623 Pressure ulcer of left heel, stage 3  L89.613 Pressure ulcer of right heel, stage 3 Quantity: 1 Electronic Signature(s) Breton, Koda H ([id number])  [id number] Page 8 of 8 Signed: 03/20/2023 2:59:39 PM By: Angelina Pih Signed: 03/20/2023 4:04:13 PM By: [name] Derry [name] Previous Signature: 03/20/2023 1:51:50 PM Version By: [name] Derry [name] Entered By: Angelina Pih on 03/20/2023 14:59:39

## 2023-03-20 NOTE — Progress Notes (Addendum)
[name], [name] ([id number]) [id number] Page 1 of 7 Visit Report for 03/20/2023 Arrival Information Details Patient Name: Date of Service: [name] 03/20/2023 1:00 PM Medical Record Number: [id number] Patient Account Number: [id number] Date of Birth/Sex: Treating RN: May 02, 1946 (77 y.o. [name] Primary Care [name]: [name] Other Clinician: Referring [name]: Treating [name]/Extender: [name] in Treatment: 16 Visit Information History Since Last Visit Added or deleted any medications: No Patient Arrived: [name] Any new allergies or adverse reactions: No Arrival Time: 13:08 Had a fall or experienced change in No Accompanied By: SPOUSE activities of daily living that may affect Transfer Assistance: None risk of falls: Patient Identification Verified: Yes Hospitalized since last visit: No Secondary Verification Process Completed: Yes Has Dressing in Place as Prescribed: Yes Patient Requires Transmission-Based Precautions: No Pain Present Now: No Patient Has Alerts: Yes Patient Alerts: Type I Diabetic eliquis ABI/TBI normal see scan Electronic Signature(s) Signed: 03/20/2023 4:04:22 PM By: Angelina Pih Entered By: Angelina Pih on 03/20/2023 13:10:14 -------------------------------------------------------------------------------- Clinic Level of Care Assessment Details Patient Name: Date of Service: [name] 03/20/2023 1:00 PM Medical Record Number: [id number] Patient Account Number: [id number] Date of Birth/Sex: Treating RN: Feb 25, 1946 (77 y.o. [name] Primary Care Nesta Scaturro: [name] Other Clinician: Referring Jaedon Siler: Treating Trevione Wert/Extender: [name] in Treatment: 16 Clinic Level of Care Assessment Items TOOL 1 Quantity Score []  - 0 Use when EandM and Procedure is performed on INITIAL visit ASSESSMENTS - Nursing  Assessment / Reassessment []  - 0 General Physical Exam (combine w/ comprehensive assessment (listed just below) when performed on new pt. evals) []  - 0 Comprehensive Assessment (HX, ROS, Risk Assessments, Wounds Hx, etc.) ASSESSMENTS - Wound and Skin Assessment / Reassessment []  - 0 Dermatologic / Skin Assessment (not related to wound area) ASSESSMENTS - Ostomy and/or Continence Assessment and Care []  - 0 Incontinence Assessment and Management []  - 0 Ostomy Care Assessment and Management (repouching, etc.) PROCESS - Coordination of Care []  - 0 Simple Patient / Family Education for ongoing care []  - 0 Complex (extensive) Patient / Family Education for ongoing care []  - 0 Staff obtains Chiropractor, Records, T Results / Process Orders est []  - 0 Staff telephones HHA, Nursing Homes / Clarify orders / etc []  - 0 Routine Transfer to another Facility (non-emergent condition) []  - 0 Routine Hospital Admission (non-emergent condition) []  - 0 New Admissions / Insurance Authorizations / Ordering NPWT Apligraf, etc. , []  - 0 Emergency Hospital Admission (emergent condition) [name], [name] ([id number]) [phone].pdf Page 2 of 7 PROCESS - Special Needs []  - 0 Pediatric / Minor Patient Management []  - 0 Isolation Patient Management []  - 0 Hearing / Language / Visual special needs []  - 0 Assessment of Community assistance (transportation, D/C planning, etc.) []  - 0 Additional assistance / Altered mentation []  - 0 Support Surface(s) Assessment (bed, cushion, seat, etc.) INTERVENTIONS - Miscellaneous []  - 0 External ear exam []  - 0 Patient Transfer (multiple staff / Nurse, adult / Similar devices) []  - 0 Simple Staple / Suture removal (25 or less) []  - 0 Complex Staple / Suture removal (26 or more) []  - 0 Hypo/Hyperglycemic Management (do not check if billed separately) []  - 0 Ankle / Brachial Index (ABI) - do not check if billed separately Has the  patient been seen at the hospital within the last three years: Yes Total Score: 0 Level Of Care: ____ Electronic Signature(s) Signed: 03/20/2023 4:04:22 PM By: Roger Shelter,  Caitlin Entered By: Angelina Pih on 03/20/2023 14:59:14 -------------------------------------------------------------------------------- Encounter Discharge Information Details Patient Name: Date of Service: [name] 03/20/2023 1:00 PM Medical Record Number: [id number] Patient Account Number: [id number] Date of Birth/Sex: Treating RN: 1946-08-25 (77 y.o. [name] Primary Care Nyjah Denio: [name] Other Clinician: Referring Monna Crean: Treating Makayle Krahn/Extender: [name] in Treatment: 16 Encounter Discharge Information Items Discharge Condition: Stable Ambulatory Status: Cane Discharge Destination: Home Transportation: Private Auto Accompanied By: spouse Schedule Follow-up Appointment: Yes Clinical Summary of Care: Electronic Signature(s) Signed: 03/20/2023 3:01:48 PM By: Angelina Pih Entered By: Angelina Pih on 03/20/2023 15:01:47 -------------------------------------------------------------------------------- Lower Extremity Assessment Details Patient Name: Date of Service: [name] 03/20/2023 1:00 PM Medical Record Number: [id number] Patient Account Number: [id number] Date of Birth/Sex: Treating RN: Apr 03, 1946 (77 y.o. [name] Primary Care Meya Clutter: [name] Other Clinician: Referring Kayle Passarelli: Treating Vallory Oetken/Extender: Claudia Pollock Weeks in Treatment: 16 Edema Assessment Assessed: Kyra Searles: No] [Right: No] Edema: [Left: Ye] [Right: s] P[Left: ORTERFIELD, Lambert [name] ([id number])] [Right: [id number] Page 3 of 7] Calf Left: Right: Point of Measurement: 34 cm From Medial Instep 38.5 cm Ankle Left: Right: Point of Measurement: 11 cm From Medial Instep 28 cm Vascular  Assessment Pulses: Dorsalis Pedis Doppler Audible: [Left:Yes] Posterior Tibial Doppler Audible: [Left:Yes] Electronic Signature(s) Signed: 03/20/2023 4:04:22 PM By: Angelina Pih Entered By: Angelina Pih on 03/20/2023 13:26:08 -------------------------------------------------------------------------------- Multi Wound Chart Details Patient Name: Date of Service: [name] 03/20/2023 1:00 PM Medical Record Number: [id number] Patient Account Number: [id number] Date of Birth/Sex: Treating RN: [birth year] (77 y.o. [name] Primary Care Unknown Flannigan: [name] Other Clinician: Referring Richrd Kuzniar: Treating Starkeisha [name]/Extender: [name] in Treatment: 16 Vital Signs Height(in): 69 Pulse(bpm): 87 Weight(lbs): 190 Blood Pressure(mmHg): 127/65 Body Mass Index(BMI): 28.1 Temperature(F): 97.7 Respiratory Rate(breaths/min): 18 [5:Photos:] [N/A:N/A] Left Calcaneus N/A N/A Wound Location: Pressure Injury N/A N/A Wounding Event: Pressure Ulcer N/A N/A Primary Etiology: Coronary Artery Disease, N/A N/A Comorbid History: Hypertension, Type I Diabetes, Osteoarthritis, Received Chemotherapy 10/20/2022 N/A N/A Date Acquired: 16 N/A N/A Weeks of Treatment: Open N/A N/A Wound Status: No N/A N/A Wound Recurrence: 1.4x1.4x1.2 N/A N/A Measurements L x W x D (cm) 1.539 N/A N/A A (cm) : rea 1.847 N/A N/A Volume (cm) : 67.30% N/A N/A % Reduction in A rea: -292.10% N/A N/A % Reduction in Volume: Category/Stage III N/A N/A Classification: Medium N/A N/A Exudate A mount: Serosanguineous N/A N/A Exudate Type: red, brown N/A N/A Exudate Color: Large (67-100%) N/A N/A Granulation A mount: Red, Pink N/A N/A Granulation Quality: Small (1-33%) N/A N/A Necrotic A mount: Fat Layer (Subcutaneous Tissue): Yes N/A N/A Exposed Structures: Fascia: No [name], [name] ([id number]) [phone].pdf Page 4 of  7 Tendon: No Muscle: No Joint: No Bone: No Small (1-33%) N/A N/A Epithelialization: Treatment Notes Electronic Signature(s) Signed: 03/20/2023 2:54:33 PM By: Angelina Pih Entered By: Angelina Pih on 03/20/2023 14:54:33 -------------------------------------------------------------------------------- Multi-Disciplinary Care Plan Details Patient Name: Date of Service: [name] 03/20/2023 1:00 PM Medical Record Number: [id number] Patient Account Number: [id number] Date of Birth/Sex: Treating RN: August 11, 1946 (77 y.o. [name] Primary Care Asencion Guisinger: [name] Other Clinician: Referring Theresia Pree: Treating Damieon Armendariz/Extender: [name] in Treatment: 16 Active Inactive Pressure Nursing Diagnoses: Potential for impaired tissue integrity related to pressure, friction, moisture, and [name] Goals: Patient will remain free of pressure ulcers Date Initiated: 11/26/2022 Target Resolution Date: 04/10/2023 Goal Status: Active Interventions: Assess: immobility, friction,  shearing, incontinence upon admission and as needed Assess offloading mechanisms upon admission and as needed Assess potential for pressure ulcer upon admission and as needed Notes: Electronic Signature(s) Signed: 03/20/2023 3:00:47 PM By: Angelina Pih Entered By: Angelina Pih on 03/20/2023 15:00:47 -------------------------------------------------------------------------------- Negative Pressure Wound Therapy Maintenance (NPWT) Details Patient Name: Date of Service: [name] 03/20/2023 1:00 PM Medical Record Number: [id number] Patient Account Number: [id number] Date of Birth/Sex: Treating RN: 1945/11/25 (77 y.o. [name] Primary Care Nitika Jackowski: [name] Other Clinician: Referring Quenton Recendez: Treating Hlee Fringer/Extender: [name] in Treatment: 16 NPWT Maintenance Performed for: Wound #5 Left Calcaneus Performed By:  Angelina Pih, RN Type: Other Coverage Size (sq cm): 1.96 Pressure Type: Constant Pressure Setting: 125 mmHG Drain Type: None Primary Contact: None Sponge/Dressing Type: Foam- Blue Date Initiated: 03/13/2023 Date Suspended: 03/17/2023 Date Resumed: 03/20/2023 Dressing Removed: No Quantity of Sponges/Gauze Removed: 0 [name], [name] ([id number]) [phone].pdf Page 5 of 7 Canister Exudate Volume: 0 Dressing Reapplied: Yes Quantity of Sponges/Gauze Inserted: 2 Respones T Treatment: o tolerated without issue Days On NPWT : 6 Post Procedure Diagnosis Same as Pre-procedure Notes Snap vac was removed on 03/17/23 and not reapplied. 03/20/23 HB dressing removed and new snap vac applied, pt tolerated without issue. Electronic Signature(s) Signed: 03/20/2023 2:57:30 PM By: Angelina Pih Entered By: Angelina Pih on 03/20/2023 14:57:30 -------------------------------------------------------------------------------- Pain Assessment Details Patient Name: Date of Service: [name] 03/20/2023 1:00 PM Medical Record Number: [id number] Patient Account Number: [id number] Date of Birth/Sex: Treating RN: 1946-04-11 (77 y.o. [name] Primary Care Needham Biggins: [name] Other Clinician: Referring Maythe Deramo: Treating Fares Ramthun/Extender: [name] in Treatment: 16 Active Problems Location of Pain Severity and Description of Pain Patient Has Paino No Site Locations Rate the pain. Current Pain Level: 0 Pain Management and Medication Current Pain Management: Electronic Signature(s) Signed: 03/20/2023 4:04:22 PM By: Angelina Pih Entered By: Angelina Pih on 03/20/2023 13:12:53 -------------------------------------------------------------------------------- Patient/Caregiver Education Details Patient Name: Date of Service: [name], [name] Reine Just 5/9/2024andnbsp1:00 PM Medical Record Number: [id number] Patient Account  Number: [id number] Date of Birth/Gender: Treating RN: January 10, 1946 (77 y.o. [name] Primary Care Physician: [name] Other Clinician: Referring Physician: Treating Physician/Extender: [name] in Treatment: [address] [name], [name] ([id number]) [id number] Page 6 of 7 Education Provided To: Patient and Caregiver Education Topics Provided Offloading: Handouts: How Offloading Helps Foot Wounds Heal Methods: Explain/Verbal Responses: State content correctly Wound/Skin Impairment: Handouts: Caring for Your Ulcer Methods: Explain/Verbal Responses: State content correctly Electronic Signature(s) Signed: 03/20/2023 4:04:22 PM By: Angelina Pih Entered By: Angelina Pih on 03/20/2023 15:01:11 -------------------------------------------------------------------------------- Wound Assessment Details Patient Name: Date of Service: [name] 03/20/2023 1:00 PM Medical Record Number: [id number] Patient Account Number: [id number] Date of Birth/Sex: Treating RN: [birth year] (77 y.o. [name] Primary Care Williamson Cavanah: [name] Other Clinician: Referring Trevionne Advani: Treating Rajiv Parlato/Extender: [name] in Treatment: 16 Wound Status Wound Number: 5 Primary Pressure Ulcer Etiology: Wound Location: Left Calcaneus Wound Open Wounding Event: Pressure Injury Status: Date Acquired: 10/20/2022 Comorbid Coronary Artery Disease, Hypertension, Type I Diabetes, Weeks Of Treatment: 16 History: Osteoarthritis, Received Chemotherapy Clustered Wound: No Photos Wound Measurements Length: (cm) 1.4 Width: (cm) 1.4 Depth: (cm) 1.2 Area: (cm) 1.539 Volume: (cm) 1.847 % Reduction in Area: 67.3% % Reduction in Volume: -292.1% Epithelialization: Small (1-33%) Tunneling: No Undermining: No Wound Description Classification: Category/Stage III Exudate Amount:  Medium Exudate Type: Serosanguineous Exudate Color: red,  brown Foul Odor After Cleansing: No Slough/Fibrino Yes Wound Bed Granulation Amount: Large (67-100%) Exposed Structure Granulation Quality: Red, Pink Fascia Exposed: No Necrotic Amount: Small (1-33%) Fat Layer (Subcutaneous Tissue) Exposed: Yes Necrotic Quality: Adherent Slough Tendon Exposed: No Muscle Exposed: No [name], [name] ([id number]) [phone].pdf Page 7 of 7 Joint Exposed: No Bone Exposed: No Treatment Notes Wound #5 (Calcaneus) Wound Laterality: Left Cleanser Byram Ancillary Kit - 15 Day Supply Discharge Instruction: Use supplies as instructed; Kit contains: (15) Saline Bullets; (15) 3x3 Gauze; 15 pr Gloves Vashe 5.8 (oz) Discharge Instruction: Use vashe 5.8 (oz) as directed Peri-Wound Care Topical Primary Dressing Secondary Dressing Secured With Tubigrip Size D, 3x10 (in/yd) Discharge Instruction: single layer, fold back to place double layer on foot Compression Wrap Compression Stockings Add-Ons Electronic Signature(s) Signed: 03/20/2023 4:04:22 PM By: Angelina Pih Entered By: Angelina Pih on 03/20/2023 13:24:17 -------------------------------------------------------------------------------- Vitals Details Patient Name: Date of Service: [name] 03/20/2023 1:00 PM Medical Record Number: [id number] Patient Account Number: [id number] Date of Birth/Sex: Treating RN: 03-16-1946 (77 y.o. [name] Primary Care Keelen Quevedo: [name] Other Clinician: Referring Jazmin Vensel: Treating Meygan Kyser/Extender: [name] in Treatment: 16 Vital Signs Time Taken: 13:10 Temperature (F): 97.7 Height (in): 69 Pulse (bpm): 87 Weight (lbs): 190 Respiratory Rate (breaths/min): 18 Body Mass Index (BMI): 28.1 Blood Pressure (mmHg): 127/65 Reference Range: 80 - 120 mg / dl Electronic Signature(s) Signed: 03/20/2023 4:04:22 PM By: Angelina Pih Entered By: Angelina Pih on 03/20/2023 13:12:42

## 2023-03-28 ENCOUNTER — Encounter: Payer: Medicare HMO | Admitting: Physician Assistant

## 2023-03-28 DIAGNOSIS — I110 Hypertensive heart disease with heart failure: Secondary | ICD-10-CM | POA: Diagnosis not present

## 2023-03-28 DIAGNOSIS — I2510 Atherosclerotic heart disease of native coronary artery without angina pectoris: Secondary | ICD-10-CM | POA: Diagnosis not present

## 2023-03-28 DIAGNOSIS — E1051 Type 1 diabetes mellitus with diabetic peripheral angiopathy without gangrene: Secondary | ICD-10-CM | POA: Diagnosis not present

## 2023-03-28 DIAGNOSIS — L98412 Non-pressure chronic ulcer of buttock with fat layer exposed: Secondary | ICD-10-CM | POA: Diagnosis not present

## 2023-03-28 DIAGNOSIS — E10622 Type 1 diabetes mellitus with other skin ulcer: Secondary | ICD-10-CM | POA: Diagnosis not present

## 2023-03-28 DIAGNOSIS — L89623 Pressure ulcer of left heel, stage 3: Secondary | ICD-10-CM | POA: Diagnosis not present

## 2023-03-28 DIAGNOSIS — I5042 Chronic combined systolic (congestive) and diastolic (congestive) heart failure: Secondary | ICD-10-CM | POA: Diagnosis not present

## 2023-03-28 DIAGNOSIS — L89613 Pressure ulcer of right heel, stage 3: Secondary | ICD-10-CM | POA: Diagnosis not present

## 2023-03-28 NOTE — Progress Notes (Signed)
JACHIN, BOGUSZ ([id number]) [id number] Page 1 of 2 Visit Report for 03/28/2023 Chief Complaint Document Details Patient Name: Date of Service: [name], [name] 03/28/2023 11:30 A M Medical Record Number: [id number] Patient Account Number: [id number] Date of Birth/Sex: Treating RN: 1946/10/14 (77 y.o. [name] Primary Care Provider: [name] Other Clinician: Referring Provider: Treating Provider/Extender: [name] in Treatment: 17 Information Obtained from: Patient Chief Complaint Left heel pressure ulcer Electronic Signature(s) Signed: 03/28/2023 11:35:50 AM By: [name] [name] Entered By: [name] on 03/28/2023 11:35:50 -------------------------------------------------------------------------------- Problem List Details Patient Name: Date of Service: [name], [name] 03/28/2023 11:30 A M Medical Record Number: [id number] Patient Account Number: [id number] Date of Birth/Sex: Treating RN: 08/08/1946 (77 y.o. [name] Primary Care Provider: [name] Other Clinician: Referring Provider: Treating Provider/Extender: [name] in Treatment: 17 Active Problems ICD-10 Encounter Code Description Active Date MDM Diagnosis E10.621 Type 1 diabetes mellitus with foot ulcer 11/26/2022 No Yes I73.89 Other specified peripheral vascular diseases 11/26/2022 No Yes L89.623 Pressure ulcer of left heel, stage 3 11/26/2022 No Yes L89.613 Pressure ulcer of right heel, stage 3 11/26/2022 No Yes L98.412 Non-pressure chronic ulcer of buttock with fat layer exposed 11/26/2022 No Yes I25.10 Atherosclerotic heart disease of native coronary artery without 11/26/2022 No Yes angina pectoris 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

## 2023-03-29 ENCOUNTER — Other Ambulatory Visit: Payer: Self-pay | Admitting: Cardiovascular Disease

## 2023-03-31 DIAGNOSIS — I2510 Atherosclerotic heart disease of native coronary artery without angina pectoris: Secondary | ICD-10-CM | POA: Diagnosis not present

## 2023-03-31 DIAGNOSIS — E1051 Type 1 diabetes mellitus with diabetic peripheral angiopathy without gangrene: Secondary | ICD-10-CM | POA: Diagnosis not present

## 2023-03-31 DIAGNOSIS — L98412 Non-pressure chronic ulcer of buttock with fat layer exposed: Secondary | ICD-10-CM | POA: Diagnosis not present

## 2023-03-31 DIAGNOSIS — L89613 Pressure ulcer of right heel, stage 3: Secondary | ICD-10-CM | POA: Diagnosis not present

## 2023-03-31 DIAGNOSIS — L89623 Pressure ulcer of left heel, stage 3: Secondary | ICD-10-CM | POA: Diagnosis not present

## 2023-03-31 DIAGNOSIS — I5042 Chronic combined systolic (congestive) and diastolic (congestive) heart failure: Secondary | ICD-10-CM | POA: Diagnosis not present

## 2023-03-31 DIAGNOSIS — I110 Hypertensive heart disease with heart failure: Secondary | ICD-10-CM | POA: Diagnosis not present

## 2023-03-31 DIAGNOSIS — E10622 Type 1 diabetes mellitus with other skin ulcer: Secondary | ICD-10-CM | POA: Diagnosis not present

## 2023-04-01 DIAGNOSIS — E1042 Type 1 diabetes mellitus with diabetic polyneuropathy: Secondary | ICD-10-CM | POA: Diagnosis not present

## 2023-04-01 DIAGNOSIS — R809 Proteinuria, unspecified: Secondary | ICD-10-CM | POA: Diagnosis not present

## 2023-04-01 DIAGNOSIS — Z9641 Presence of insulin pump (external) (internal): Secondary | ICD-10-CM | POA: Diagnosis not present

## 2023-04-01 DIAGNOSIS — E1059 Type 1 diabetes mellitus with other circulatory complications: Secondary | ICD-10-CM | POA: Diagnosis not present

## 2023-04-01 DIAGNOSIS — E1029 Type 1 diabetes mellitus with other diabetic kidney complication: Secondary | ICD-10-CM | POA: Diagnosis not present

## 2023-04-01 DIAGNOSIS — E103212 Type 1 diabetes mellitus with mild nonproliferative diabetic retinopathy with macular edema, left eye: Secondary | ICD-10-CM | POA: Diagnosis not present

## 2023-04-03 ENCOUNTER — Encounter: Payer: Medicare HMO | Admitting: Physician Assistant

## 2023-04-03 DIAGNOSIS — I5042 Chronic combined systolic (congestive) and diastolic (congestive) heart failure: Secondary | ICD-10-CM | POA: Diagnosis not present

## 2023-04-03 DIAGNOSIS — I110 Hypertensive heart disease with heart failure: Secondary | ICD-10-CM | POA: Diagnosis not present

## 2023-04-03 DIAGNOSIS — L89613 Pressure ulcer of right heel, stage 3: Secondary | ICD-10-CM | POA: Diagnosis not present

## 2023-04-03 DIAGNOSIS — L89623 Pressure ulcer of left heel, stage 3: Secondary | ICD-10-CM | POA: Diagnosis not present

## 2023-04-03 DIAGNOSIS — E10622 Type 1 diabetes mellitus with other skin ulcer: Secondary | ICD-10-CM | POA: Diagnosis not present

## 2023-04-03 DIAGNOSIS — I2510 Atherosclerotic heart disease of native coronary artery without angina pectoris: Secondary | ICD-10-CM | POA: Diagnosis not present

## 2023-04-03 DIAGNOSIS — L98412 Non-pressure chronic ulcer of buttock with fat layer exposed: Secondary | ICD-10-CM | POA: Diagnosis not present

## 2023-04-03 DIAGNOSIS — E1051 Type 1 diabetes mellitus with diabetic peripheral angiopathy without gangrene: Secondary | ICD-10-CM | POA: Diagnosis not present

## 2023-04-03 NOTE — Progress Notes (Signed)
[name] ([id number]) [id number] Page 1 of 2 Visit Report for 03/31/2023 Physician Orders Details Patient Name: Date of Service: [name] RTERFIELD, [name] 03/31/2023 4:00 PM Medical Record Number: [id number] Patient Account Number: [id number] Date of Birth/Sex: Treating RN: 1946/05/27 (76 y.o. [name]) [name] Primary Care Provider: [name] Other Clinician: Referring Provider: Treating Provider/Extender: [name] in Treatment: 17 Verbal / Phone Orders: No Diagnosis Coding Follow-up Appointments ppointment in 1 week. - Thursday Return A Nurse Visit as needed Bathing/ Shower/ Hygiene May shower with wound dressing protected with water repellent cover or cast protector. No tub bath. - no soaking foot in tub Anesthetic (Use 'Patient Medications' Section for Anesthetic Order Entry) Lidocaine applied to wound bed Edema Control - Lymphedema / Segmental Compressive Device / Other Patient to wear own compression stockings. Remove compression stockings every night before going to bed and put on every morning when getting up. - pt has juxtafit velcro wrap-ON HOLD AT THIS TIME Elevate, Exercise Daily and A void Standing for Long Periods of Time. Elevate legs to the level of the heart and pump ankles as often as possible Elevate leg(s) parallel to the floor when sitting. DO YOUR BEST to sleep in the bed at night. DO NOT sleep in your recliner. Long hours of sitting in a recliner leads to swelling of the legs and/or potential wounds on your backside. Negative Pressure Wound Therapy Snap Vac applied - left calc, with bridge at 148mm/Hg continuous- ON HOLD CURRENTLY Wound Treatment Wound #5 - Calcaneus Wound Laterality: Left Cleanser: Byram Ancillary Kit - 15 Day Supply (Generic) 1 x Per Week/30 Days Discharge Instructions: Use supplies as instructed; Kit contains: (15) Saline Bullets; (15) 3x3 Gauze; 15 pr Gloves Cleanser: Vashe  5.8 (oz) 1 x Per Week/30 Days Discharge Instructions: Use vashe 5.8 (oz) as directed Prim Dressing: Hydrofera Blue Ready Transfer Foam, 2.5x2.5 (in/in) 1 x Per Week/30 Days ary Discharge Instructions: cut into a triangle shape to wedge in wound Apply Hydrofera Blue Ready to wound bed as directed Secondary Dressing: ABD Pad 5x9 (in/in) 1 x Per Week/30 Days Discharge Instructions: cut into heel cup Secondary Dressing: Zetuvit Plus 4x4 (in/in) 1 x Per Week/30 Days Compression Wrap: Urgo K2 Lite, two layer compression system, large 1 x Per Week/30 Days Electronic Signature(s) Signed: 03/31/2023 4:43:04 PM By: [name] RN Signed: 04/02/2023 5:35:29 PM By: [name] [name] Entered By: [name] on 03/31/2023 16:43:04 -------------------------------------------------------------------------------- SuperBill Details Patient Name: Date of Service: [name] RTERFIELD, [name] 03/31/2023 Medical Record Number: [id number] Patient Account Number: [id number] Date of Birth/Sex: Treating RN: [birth year] (76 y.o. [name] Primary Care Provider: [name] Other Clinician: Referring Provider: Treating Provider/Extender: [name] in Treatment: [address], [name] ([id number]) [id number] Page 2 of 2 Diagnosis Coding ICD-10 Codes Code Description E10.621 Type 1 diabetes mellitus with foot ulcer I73.89 Other specified peripheral vascular diseases L89.623 Pressure ulcer of left heel, stage 3 L89.613 Pressure ulcer of right heel, stage 3 L98.412 Non-pressure chronic ulcer of buttock with fat layer exposed I25.10 Atherosclerotic heart disease of native coronary artery without angina pectoris I50.42 Chronic combined systolic (congestive) and diastolic (congestive) heart failure I10 Essential (primary) hypertension Facility Procedures : CPT4 Code: 62952841 Description: (Facility Use Only) 29581LT - APPLY MULTLAY COMPRS LWR LT  LEG Modifier: Quantity: 1 Electronic Signature(s) Signed: 03/31/2023 4:43:55 PM By: [name] RN Signed: 04/02/2023 5:35:29 PM By: [name] [name] Entered By: [name] on 03/31/2023 16:43:55

## 2023-04-08 DIAGNOSIS — I5042 Chronic combined systolic (congestive) and diastolic (congestive) heart failure: Secondary | ICD-10-CM | POA: Diagnosis not present

## 2023-04-08 DIAGNOSIS — I2510 Atherosclerotic heart disease of native coronary artery without angina pectoris: Secondary | ICD-10-CM | POA: Diagnosis not present

## 2023-04-08 DIAGNOSIS — E10622 Type 1 diabetes mellitus with other skin ulcer: Secondary | ICD-10-CM | POA: Diagnosis not present

## 2023-04-08 DIAGNOSIS — L89613 Pressure ulcer of right heel, stage 3: Secondary | ICD-10-CM | POA: Diagnosis not present

## 2023-04-08 DIAGNOSIS — E1051 Type 1 diabetes mellitus with diabetic peripheral angiopathy without gangrene: Secondary | ICD-10-CM | POA: Diagnosis not present

## 2023-04-08 DIAGNOSIS — I110 Hypertensive heart disease with heart failure: Secondary | ICD-10-CM | POA: Diagnosis not present

## 2023-04-08 DIAGNOSIS — L89623 Pressure ulcer of left heel, stage 3: Secondary | ICD-10-CM | POA: Diagnosis not present

## 2023-04-08 DIAGNOSIS — L98412 Non-pressure chronic ulcer of buttock with fat layer exposed: Secondary | ICD-10-CM | POA: Diagnosis not present

## 2023-04-10 ENCOUNTER — Encounter: Payer: Medicare HMO | Admitting: Physician Assistant

## 2023-04-10 DIAGNOSIS — E1051 Type 1 diabetes mellitus with diabetic peripheral angiopathy without gangrene: Secondary | ICD-10-CM | POA: Diagnosis not present

## 2023-04-10 DIAGNOSIS — L89613 Pressure ulcer of right heel, stage 3: Secondary | ICD-10-CM | POA: Diagnosis not present

## 2023-04-10 DIAGNOSIS — L98412 Non-pressure chronic ulcer of buttock with fat layer exposed: Secondary | ICD-10-CM | POA: Diagnosis not present

## 2023-04-10 DIAGNOSIS — I5042 Chronic combined systolic (congestive) and diastolic (congestive) heart failure: Secondary | ICD-10-CM | POA: Diagnosis not present

## 2023-04-10 DIAGNOSIS — I2510 Atherosclerotic heart disease of native coronary artery without angina pectoris: Secondary | ICD-10-CM | POA: Diagnosis not present

## 2023-04-10 DIAGNOSIS — E10622 Type 1 diabetes mellitus with other skin ulcer: Secondary | ICD-10-CM | POA: Diagnosis not present

## 2023-04-10 DIAGNOSIS — I110 Hypertensive heart disease with heart failure: Secondary | ICD-10-CM | POA: Diagnosis not present

## 2023-04-10 DIAGNOSIS — L89623 Pressure ulcer of left heel, stage 3: Secondary | ICD-10-CM | POA: Diagnosis not present

## 2023-04-11 NOTE — Progress Notes (Signed)
[name], [name] ([id number]) [id number] Page 1 of 2 Visit Report for 04/08/2023 Physician Orders Details Patient Name: Date of Service: [name] 04/08/2023 4:15 PM Medical Record Number: [id number] Patient Account Number: [id number] Date of Birth/Sex: Treating RN: 1946/04/05 (77 y.o. Arthur Holms Primary Care Provider: [name] Other Clinician: Referring Provider: Treating Provider/Extender: [name] in Treatment: 75 Verbal / Phone Orders: No Diagnosis Coding Follow-up Appointments ppointment in 1 week. - Thursday Return A Nurse Visit as needed Bathing/ Shower/ Hygiene May shower with wound dressing protected with water repellent cover or cast protector. No tub bath. - no soaking foot in tub Anesthetic (Use 'Patient Medications' Section for Anesthetic Order Entry) Lidocaine applied to wound bed Edema Control - Lymphedema / Segmental Compressive Device / Other Patient to wear own compression stockings. Remove compression stockings every night before going to bed and put on every morning when getting up. - pt has juxtafit velcro wrap-ON HOLD AT THIS TIME Elevate, Exercise Daily and A void Standing for Long Periods of Time. Elevate legs to the level of the heart and pump ankles as often as possible Elevate leg(s) parallel to the floor when sitting. DO YOUR BEST to sleep in the bed at night. DO NOT sleep in your recliner. Long hours of sitting in a recliner leads to swelling of the legs and/or potential wounds on your backside. Wound Treatment Wound #5 - Calcaneus Wound Laterality: Left Cleanser: Byram Ancillary Kit - 15 Day Supply (Generic) 1 x Per Week/30 Days Discharge Instructions: Use supplies as instructed; Kit contains: (15) Saline Bullets; (15) 3x3 Gauze; 15 pr Gloves Cleanser: Vashe 5.8 (oz) 1 x Per Week/30 Days Discharge Instructions: Use vashe 5.8 (oz) as directed Prim Dressing: Hydrofera Blue  Ready Transfer Foam, 2.5x2.5 (in/in) 1 x Per Week/30 Days ary Discharge Instructions: cut into a triangle shape to wedge in wound Apply Hydrofera Blue Ready to wound bed as directed Secondary Dressing: Zetuvit Plus 4x4 (in/in) 1 x Per Week/30 Days Compression Wrap: Urgo K2 Lite, two layer compression system, large 1 x Per Week/30 Days Electronic Signature(s) Signed: 04/09/2023 5:03:24 PM By: [name] [name] Signed: 04/10/2023 9:54:14 AM By: Elliot Gurney, BSN, RN, CWS, Kim RN, BSN Entered By: Elliot Gurney, BSN, RN, CWS, Kim on 04/08/2023 16:47:24 -------------------------------------------------------------------------------- SuperBill Details Patient Name: Date of Service: [name], [name] 04/08/2023 Medical Record Number: [id number] Patient Account Number: [id number] Date of Birth/Sex: Treating RN: Mar 31, 1946 (77 y.o. Arthur Holms Primary Care Provider: [name] Other Clinician: Referring Provider: Treating Provider/Extender: [name] in Treatment: 19 Diagnosis Coding ICD-10 Codes Code Description E10.621 Type 1 diabetes mellitus with foot ulcer [name], [name] ([id number]) [id number] Page 2 of 2 I73.89 Other specified peripheral vascular diseases L89.623 Pressure ulcer of left heel, stage 3 L89.613 Pressure ulcer of right heel, stage 3 L98.412 Non-pressure chronic ulcer of buttock with fat layer exposed I25.10 Atherosclerotic heart disease of native coronary artery without angina pectoris I50.42 Chronic combined systolic (congestive) and diastolic (congestive) heart failure I10 Essential (primary) hypertension Facility Procedures : CPT4 Code: 62952841 Description: (Facility Use Only) 29581LT - APPLY MULTLAY COMPRS LWR LT LEG Modifier: Quantity: 1 Electronic Signature(s) Signed: 04/09/2023 5:03:24 PM By: [name] [name] Signed: 04/10/2023 9:54:14 AM By: Elliot Gurney, BSN, RN, CWS, Kim RN, BSN Entered By: Elliot Gurney, BSN, RN, CWS,  Kim on 04/08/2023 16:48:14

## 2023-04-15 ENCOUNTER — Encounter: Payer: Medicare HMO | Attending: Physician Assistant | Admitting: Physician Assistant

## 2023-04-15 DIAGNOSIS — L98412 Non-pressure chronic ulcer of buttock with fat layer exposed: Secondary | ICD-10-CM | POA: Insufficient documentation

## 2023-04-15 DIAGNOSIS — I5042 Chronic combined systolic (congestive) and diastolic (congestive) heart failure: Secondary | ICD-10-CM | POA: Insufficient documentation

## 2023-04-15 DIAGNOSIS — E10622 Type 1 diabetes mellitus with other skin ulcer: Secondary | ICD-10-CM | POA: Diagnosis not present

## 2023-04-15 DIAGNOSIS — L89623 Pressure ulcer of left heel, stage 3: Secondary | ICD-10-CM | POA: Insufficient documentation

## 2023-04-15 DIAGNOSIS — I2510 Atherosclerotic heart disease of native coronary artery without angina pectoris: Secondary | ICD-10-CM | POA: Insufficient documentation

## 2023-04-15 DIAGNOSIS — L89613 Pressure ulcer of right heel, stage 3: Secondary | ICD-10-CM | POA: Insufficient documentation

## 2023-04-15 DIAGNOSIS — I110 Hypertensive heart disease with heart failure: Secondary | ICD-10-CM | POA: Diagnosis not present

## 2023-04-15 DIAGNOSIS — E1051 Type 1 diabetes mellitus with diabetic peripheral angiopathy without gangrene: Secondary | ICD-10-CM | POA: Insufficient documentation

## 2023-04-15 NOTE — Progress Notes (Signed)
[name], [name] ([id number]) [id number] Page 1 of 3 Visit Report for 04/15/2023 Chief Complaint Document Details Patient Name: Date of Service: [name], [name] 04/15/2023 2:30 PM Medical Record Number: [id number] Patient Account Number: [id number] Date of Birth/Sex: Treating RN: [birth year] (77 y.o. [name]) [name] Primary Care Provider: [name] Other Clinician: Betha Loa Referring Provider: Treating Provider/Extender: [name] in Treatment: 20 Information Obtained from: Patient Chief Complaint Left heel pressure ulcer Electronic Signature(s) Signed: 04/15/2023 3:13:01 PM By: [name] [name] Entered By: [name] on 04/15/2023 15:13:01 -------------------------------------------------------------------------------- Physician Orders Details Patient Name: Date of Service: [name], [name] 04/15/2023 2:30 PM Medical Record Number: [id number] Patient Account Number: [id number] Date of Birth/Sex: Treating RN: Feb 19, 1946 (77 y.o. [name]) [name] Primary Care Provider: [name] Other Clinician: Betha Loa Referring Provider: Treating Provider/Extender: [name] in Treatment: 20 Verbal / Phone Orders: Yes Clinician: [name] Read Back and Verified: Yes Diagnosis Coding ICD-10 Coding Code Description E10.621 Type 1 diabetes mellitus with foot ulcer I73.89 Other specified peripheral vascular diseases L89.623 Pressure ulcer of left heel, stage 3 L89.613 Pressure ulcer of right heel, stage 3 L98.412 Non-pressure chronic ulcer of buttock with fat layer exposed I25.10 Atherosclerotic heart disease of native coronary artery without angina pectoris I50.42 Chronic combined systolic (congestive) and diastolic (congestive) heart failure I10 Essential (primary) hypertension Follow-up Appointments ppointment in 1 week. - Tuesday to check snap vac and re wrap Return A Nurse  Visit as needed Bathing/ Shower/ Hygiene May shower with wound dressing protected with water repellent cover or cast protector. No tub bath. - no soaking foot in tub Anesthetic (Use 'Patient Medications' Section for Anesthetic Order Entry) Lidocaine applied to wound bed Edema Control - Lymphedema / Segmental Compressive Device / Other Patient to wear own compression stockings. Remove compression stockings every night before going to bed and put on every morning when getting up. - pt has juxtafit velcro wrap-ON HOLD AT THIS TIME Elevate, Exercise Daily and A void Standing for Long Periods of Time. Elevate legs to the level of the heart and pump ankles as often as possible Elevate leg(s) parallel to the floor when sitting. DO YOUR BEST to sleep in the bed at night. DO NOT sleep in your recliner. Long hours of sitting in a recliner leads to swelling of the legs and/or potential wounds on your backside. Negative Pressure Wound Therapy Snap Vac applied Wound VAC settings at continuous pressure. Use foam to wound cavity. Please order WHITE foam to fill any tunnel/s and/or TRAVIEN, MURZYN ([id number]) [id number].pdf Page 2 of 3 undermining when necessary. Change VAC dressing 2 X WEEK. Change canister as indicated when full. Number of foam/gauze pieces used in the dressing = - 2, one blue foam in wound bed, one foam piece in bridge. Wound Treatment Wound #5 - Calcaneus Wound Laterality: Left Cleanser: Byram Ancillary Kit - 15 Day Supply (Generic) 1 x Per Week/30 Days Discharge Instructions: Use supplies as instructed; Kit contains: (15) Saline Bullets; (15) 3x3 Gauze; 15 pr Gloves Cleanser: Vashe 5.8 (oz) 1 x Per Week/30 Days Discharge Instructions: Use vashe 5.8 (oz) as directed Compression Wrap: Urgo K2 Lite, two layer compression system, large 1 x Per Week/30 Days Electronic Signature(s) Signed: 04/15/2023 5:02:52 PM By: Betha Loa Signed: 04/15/2023  5:37:24 PM By: [name] [name] Entered By: Betha Loa on 04/15/2023 15:35:02 -------------------------------------------------------------------------------- Problem List Details Patient Name: Date of Service: [name], [name] 04/15/2023 2:30 PM  Medical Record Number: [id number] Patient Account Number: [id number] Date of Birth/Sex: Treating RN: [birth year] (77 y.o. [name]) [name] Primary Care Provider: [name] Other Clinician: Betha Loa Referring Provider: Treating Provider/Extender: [name] in Treatment: 20 Active Problems ICD-10 Encounter Code Description Active Date MDM Diagnosis E10.621 Type 1 diabetes mellitus with foot ulcer 11/26/2022 No Yes I73.89 Other specified peripheral vascular diseases 11/26/2022 No Yes L89.623 Pressure ulcer of left heel, stage 3 11/26/2022 No Yes L89.613 Pressure ulcer of right heel, stage 3 11/26/2022 No Yes L98.412 Non-pressure chronic ulcer of buttock with fat layer exposed 11/26/2022 No Yes I25.10 Atherosclerotic heart disease of native coronary artery without angina pectoris 11/26/2022 No Yes I50.42 Chronic combined systolic (congestive) and diastolic (congestive) heart failure 11/26/2022 No Yes I10 Essential (primary) hypertension 11/26/2022 No Yes Inactive Problems Resolved Problems [name], [name] ([id number]) [id number] Page 3 of 3 Electronic Signature(s) Signed: 04/15/2023 3:12:40 PM By: [name] [name] Entered By: [name] on 04/15/2023 15:12:40 -------------------------------------------------------------------------------- SuperBill Details Patient Name: Date of Service: [name], [name] 04/15/2023 Medical Record Number: [id number] Patient Account Number: [id number] Date of Birth/Sex: Treating RN: 1946-05-06 (77 y.o. [name]) [name] Primary Care Provider: [name] Other Clinician: Betha Loa Referring Provider: Treating Provider/Extender:  [name] in Treatment: 20 Diagnosis Coding ICD-10 Codes Code Description E10.621 Type 1 diabetes mellitus with foot ulcer I73.89 Other specified peripheral vascular diseases L89.623 Pressure ulcer of left heel, stage 3 L89.613 Pressure ulcer of right heel, stage 3 L98.412 Non-pressure chronic ulcer of buttock with fat layer exposed I25.10 Atherosclerotic heart disease of native coronary artery without angina pectoris I50.42 Chronic combined systolic (congestive) and diastolic (congestive) heart failure I10 Essential (primary) hypertension Facility Procedures : CPT4 Code: 08657846 Description: 96295 Single Use NEG PRESS WND TX <=50 SQ CM Modifier: Quantity: 1 : CPT4 Code: 28413244 Description: (Facility Use Only) 29581LT - APPLY MULTLAY COMPRS LWR LT LEG Modifier: Quantity: 1 Physician Procedures : CPT4 Code Description Modifier [id number] 99214 - WC PHYS LEVEL 4 - EST PT ICD-10 Diagnosis Description E10.621 Type 1 diabetes mellitus with foot ulcer I73.89 Other specified peripheral vascular diseases L89.623 Pressure ulcer of left heel, stage 3  L89.613 Pressure ulcer of right heel, stage 3 Quantity: 1 Electronic Signature(s) Signed: 04/15/2023 5:23:37 PM By: [name] [name] Previous Signature: 04/15/2023 5:02:52 PM Version By: Betha Loa Entered By: [name] on 04/15/2023 17:23:37

## 2023-04-16 ENCOUNTER — Encounter: Payer: Medicare HMO | Attending: Physician Assistant

## 2023-04-16 DIAGNOSIS — E1051 Type 1 diabetes mellitus with diabetic peripheral angiopathy without gangrene: Secondary | ICD-10-CM | POA: Insufficient documentation

## 2023-04-16 DIAGNOSIS — E10621 Type 1 diabetes mellitus with foot ulcer: Secondary | ICD-10-CM | POA: Insufficient documentation

## 2023-04-16 DIAGNOSIS — I5042 Chronic combined systolic (congestive) and diastolic (congestive) heart failure: Secondary | ICD-10-CM | POA: Diagnosis not present

## 2023-04-16 DIAGNOSIS — I2510 Atherosclerotic heart disease of native coronary artery without angina pectoris: Secondary | ICD-10-CM | POA: Diagnosis not present

## 2023-04-16 DIAGNOSIS — M1990 Unspecified osteoarthritis, unspecified site: Secondary | ICD-10-CM | POA: Diagnosis not present

## 2023-04-16 DIAGNOSIS — L89613 Pressure ulcer of right heel, stage 3: Secondary | ICD-10-CM | POA: Insufficient documentation

## 2023-04-16 DIAGNOSIS — L98412 Non-pressure chronic ulcer of buttock with fat layer exposed: Secondary | ICD-10-CM | POA: Insufficient documentation

## 2023-04-16 DIAGNOSIS — L89623 Pressure ulcer of left heel, stage 3: Secondary | ICD-10-CM | POA: Insufficient documentation

## 2023-04-16 DIAGNOSIS — E785 Hyperlipidemia, unspecified: Secondary | ICD-10-CM | POA: Diagnosis not present

## 2023-04-16 DIAGNOSIS — E10622 Type 1 diabetes mellitus with other skin ulcer: Secondary | ICD-10-CM | POA: Insufficient documentation

## 2023-04-16 DIAGNOSIS — M720 Palmar fascial fibromatosis [Dupuytren]: Secondary | ICD-10-CM | POA: Diagnosis not present

## 2023-04-16 DIAGNOSIS — I110 Hypertensive heart disease with heart failure: Secondary | ICD-10-CM | POA: Insufficient documentation

## 2023-04-17 ENCOUNTER — Other Ambulatory Visit: Payer: Self-pay

## 2023-04-17 ENCOUNTER — Other Ambulatory Visit: Payer: Self-pay | Admitting: Cardiovascular Disease

## 2023-04-17 DIAGNOSIS — I10 Essential (primary) hypertension: Secondary | ICD-10-CM

## 2023-04-17 DIAGNOSIS — E782 Mixed hyperlipidemia: Secondary | ICD-10-CM

## 2023-04-18 NOTE — Progress Notes (Signed)
KHAMARI, SHEEHAN ([id number]) [id number] Page 1 of 2 Visit Report for 04/16/2023 Physician Orders Details Patient Name: Date of Service: [name], [name] 04/16/2023 11:45 A M Medical Record Number: [id number] Patient Account Number: [id number] Date of Birth/Sex: Treating RN: [birth year] (77 y.o. Arthur Holms Primary Care Provider: [name] Other Clinician: Betha Loa Referring Provider: Treating Provider/Extender: RO BSO Dorris Carnes, MICHA EL Randol Kern in Treatment: 20 Verbal / Phone Orders: Yes Clinician: Huel Coventry Read Back and Verified: Yes Diagnosis Coding Follow-up Appointments ppointment in 1 week. - Tuesday to check snap vac and re wrap Return A Nurse Visit as needed Bathing/ Shower/ Hygiene May shower with wound dressing protected with water repellent cover or cast protector. No tub bath. - no soaking foot in tub Anesthetic (Use 'Patient Medications' Section for Anesthetic Order Entry) Lidocaine applied to wound bed Edema Control - Lymphedema / Segmental Compressive Device / Other Patient to wear own compression stockings. Remove compression stockings every night before going to bed and put on every morning when getting up. - pt has juxtafit velcro wrap-ON HOLD AT THIS TIME Elevate, Exercise Daily and A void Standing for Long Periods of Time. Elevate legs to the level of the heart and pump ankles as often as possible Elevate leg(s) parallel to the floor when sitting. DO YOUR BEST to sleep in the bed at night. DO NOT sleep in your recliner. Long hours of sitting in a recliner leads to swelling of the legs and/or potential wounds on your backside. Negative Pressure Wound Therapy Snap Vac applied Wound VAC settings at continuous pressure. Use foam to wound cavity. Please order WHITE foam to fill any tunnel/s and/or undermining when necessary. Change VAC dressing 2 X WEEK. Change canister as indicated when  full. Number of foam/gauze pieces used in the dressing = - 2, one blue foam in wound bed, one foam piece in bridge. Wound Treatment Wound #5 - Calcaneus Wound Laterality: Left Cleanser: Byram Ancillary Kit - 15 Day Supply (Generic) 1 x Per Week/30 Days Discharge Instructions: Use supplies as instructed; Kit contains: (15) Saline Bullets; (15) 3x3 Gauze; 15 pr Gloves Cleanser: Vashe 5.8 (oz) 1 x Per Week/30 Days Discharge Instructions: Use vashe 5.8 (oz) as directed Compression Wrap: Urgo K2 Lite, two layer compression system, large 1 x Per Week/30 Days Electronic Signature(s) Signed: 04/16/2023 5:06:19 PM By: Baltazar Najjar MD Signed: 04/17/2023 4:55:34 PM By: Betha Loa Entered By: Betha Loa on 04/16/2023 17:00:41 -------------------------------------------------------------------------------- SuperBill Details Patient Name: Date of Service: [name], [name] 04/16/2023 Medical Record Number: [id number] Patient Account Number: [id number] Date of Birth/Sex: Treating RN: [birth year] (77 y.o. Arthur Holms Primary Care Provider: [name] Other Clinician: Betha Loa Referring Provider: Treating Provider/Extender: RO BSO Dorris Carnes, MICHA EL Randol Kern in Treatment: 20 Diagnosis Coding ICD-10 Codes Code Description [name], [name] ([id number]) [id number] Page 2 of 2 E10.621 Type 1 diabetes mellitus with foot ulcer I73.89 Other specified peripheral vascular diseases L89.623 Pressure ulcer of left heel, stage 3 L89.613 Pressure ulcer of right heel, stage 3 L98.412 Non-pressure chronic ulcer of buttock with fat layer exposed I25.10 Atherosclerotic heart disease of native coronary artery without angina pectoris I50.42 Chronic combined systolic (congestive) and diastolic (congestive) heart failure I10 Essential (primary) hypertension Facility Procedures : CPT4 Code: 28413244 Description: 01027 Single Use NEG PRESS WND TX <=50 SQ  CM Modifier: Quantity: 1 Electronic Signature(s) Signed: 04/16/2023 5:06:19 PM By: Baltazar Najjar MD Signed: 04/17/2023 4:55:34 PM By: Betha Loa  Entered By: Betha Loa on 04/16/2023 17:01:52

## 2023-04-22 ENCOUNTER — Encounter: Payer: Medicare HMO | Admitting: Physician Assistant

## 2023-04-22 DIAGNOSIS — L98412 Non-pressure chronic ulcer of buttock with fat layer exposed: Secondary | ICD-10-CM | POA: Diagnosis not present

## 2023-04-22 DIAGNOSIS — I2510 Atherosclerotic heart disease of native coronary artery without angina pectoris: Secondary | ICD-10-CM | POA: Diagnosis not present

## 2023-04-22 DIAGNOSIS — L89623 Pressure ulcer of left heel, stage 3: Secondary | ICD-10-CM | POA: Diagnosis not present

## 2023-04-22 DIAGNOSIS — E10622 Type 1 diabetes mellitus with other skin ulcer: Secondary | ICD-10-CM | POA: Diagnosis not present

## 2023-04-22 DIAGNOSIS — I110 Hypertensive heart disease with heart failure: Secondary | ICD-10-CM | POA: Diagnosis not present

## 2023-04-22 DIAGNOSIS — E1051 Type 1 diabetes mellitus with diabetic peripheral angiopathy without gangrene: Secondary | ICD-10-CM | POA: Diagnosis not present

## 2023-04-22 DIAGNOSIS — L89613 Pressure ulcer of right heel, stage 3: Secondary | ICD-10-CM | POA: Diagnosis not present

## 2023-04-22 DIAGNOSIS — I5042 Chronic combined systolic (congestive) and diastolic (congestive) heart failure: Secondary | ICD-10-CM | POA: Diagnosis not present

## 2023-04-23 NOTE — Progress Notes (Signed)
[name], [name] ([id number]) [id number] Page 1 of 7 Visit Report for 04/22/2023 Arrival Information Details Patient Name: Date of Service: [name], [name] 04/22/2023 3:45 PM Medical Record Number: [id number] Patient Account Number: [id number] Date of Birth/Sex: Treating RN: [birth year] (77 y.o. [name] Primary Care [name]: [name] Other Clinician: Betha Loa Referring [name]: Treating [name]/Extender: [name] in Treatment: 21 Visit Information History Since Last Visit All ordered tests and consults were completed: No Patient Arrived: Ambulatory Added or deleted any medications: No Arrival Time: 15:56 Any new allergies or adverse reactions: No Transfer Assistance: None Had a fall or experienced change in No Patient Identification Verified: Yes activities of daily living that may affect Secondary Verification Process Completed: Yes risk of falls: Patient Requires Transmission-Based Precautions: No Signs or symptoms of abuse/neglect since last visito No Patient Has Alerts: Yes Hospitalized since last visit: No Patient Alerts: Type I Diabetic Implantable device outside of the clinic excluding No eliquis cellular tissue based products placed in the center ABI/TBI normal see scan since last visit: Has Dressing in Place as Prescribed: Yes Has Compression in Place as Prescribed: Yes Pain Present Now: Yes Electronic Signature(s) Signed: 04/22/2023 5:21:39 PM By: Betha Loa Entered By: Betha Loa on 04/22/2023 15:56:41 -------------------------------------------------------------------------------- Clinic Level of Care Assessment Details Patient Name: Date of Service: [name], [name] 04/22/2023 3:45 PM Medical Record Number: [id number] Patient Account Number: [id number] Date of Birth/Sex: Treating RN: 1945-12-22 (77 y.o. [name] Primary Care [name]: [name] Other  Clinician: Betha Loa Referring [name]: Treating [name]/Extender: [name] in Treatment: 21 Clinic Level of Care Assessment Items TOOL 1 Quantity Score []  - 0 Use when EandM and Procedure is performed on INITIAL visit ASSESSMENTS - Nursing Assessment / Reassessment []  - 0 General Physical Exam (combine w/ comprehensive assessment (listed just below) when performed on new pt. evals) []  - 0 Comprehensive Assessment (HX, ROS, Risk Assessments, Wounds Hx, etc.) ASSESSMENTS - Wound and Skin Assessment / Reassessment []  - 0 Dermatologic / Skin Assessment (not related to wound area) ASSESSMENTS - Ostomy and/or Continence Assessment and Care []  - 0 Incontinence Assessment and Management []  - 0 Ostomy Care Assessment and Management (repouching, etc.) PROCESS - Coordination of Care []  - 0 Simple Patient / Family Education for ongoing care []  - 0 Complex (extensive) Patient / Family Education for ongoing care []  - 0 Staff obtains Consents, Records, T Results / Process Orders est []  - 0 Staff telephones HHA, Nursing Homes / Clarify orders / etc []  - 0 Routine Transfer to another Facility (non-emergent condition) []  - 0 Routine Hospital Admission (non-emergent condition) [name], [name] ([id number]) [id number] Page 2 of 7 []  - 0 New Admissions / Manufacturing engineer / Ordering NPWT Apligraf, etc. , []  - 0 Emergency Hospital Admission (emergent condition) PROCESS - Special Needs []  - 0 Pediatric / Minor Patient Management []  - 0 Isolation Patient Management []  - 0 Hearing / Language / Visual special needs []  - 0 Assessment of Community assistance (transportation, D/C planning, etc.) []  - 0 Additional assistance / Altered mentation []  - 0 Support Surface(s) Assessment (bed, cushion, seat, etc.) INTERVENTIONS - Miscellaneous []  - 0 External ear exam []  - 0 Patient Transfer (multiple staff / Nurse, adult /  Similar devices) []  - 0 Simple Staple / Suture removal (25 or less) []  - 0 Complex Staple / Suture removal (26 or more) []  - 0 Hypo/Hyperglycemic Management (do not check if billed separately) []  -  0 Ankle / Brachial Index (ABI) - do not check if billed separately Has the patient been seen at the hospital within the last three years: Yes Total Score: 0 Level Of Care: ____ Electronic Signature(s) Signed: 04/22/2023 5:21:39 PM By: Betha Loa Entered By: Betha Loa on 04/22/2023 16:39:48 -------------------------------------------------------------------------------- Encounter Discharge Information Details Patient Name: Date of Service: [name], [name] 04/22/2023 3:45 PM Medical Record Number: [id number] Patient Account Number: [id number] Date of Birth/Sex: Treating RN: 1946-10-27 (77 y.o. [name] Primary Care [name]: [name] Other Clinician: Betha Loa Referring [name]: Treating [name]/Extender: [name] in Treatment: 21 Encounter Discharge Information Items Discharge Condition: Stable Ambulatory Status: Ambulatory Discharge Destination: Home Transportation: Private Auto Accompanied By: significant other Schedule Follow-up Appointment: Yes Clinical Summary of Care: Electronic Signature(s) Signed: 04/22/2023 5:21:39 PM By: Betha Loa Entered By: Betha Loa on 04/22/2023 17:17:28 -------------------------------------------------------------------------------- Lower Extremity Assessment Details Patient Name: Date of Service: [name], [name] 04/22/2023 3:45 PM Medical Record Number: [id number] Patient Account Number: [id number] Date of Birth/Sex: Treating RN: 10/09/1946 (77 y.o. [name] Primary Care [name]: [name] Other Clinician: Betha Loa Referring [name]: Treating [name]/Extender: [name] in Treatment: 21 Edema Assessment P[Left: [name], [name] ([id number])] [name]: [id number] Page 3 of 7] Assessed: [Left: Yes] [Right: No] Edema: [Left: Ye] [Right: s] Calf Left: Right: Point of Measurement: 34 cm From Medial Instep Ankle Left: Right: Point of Measurement: 11 cm From Medial Instep Vascular Assessment Pulses: Dorsalis Pedis Palpable: [Left:Yes] Electronic Signature(s) Signed: 04/22/2023 4:52:35 PM By: Midge Aver MSN RN CNS WTA Signed: 04/22/2023 5:21:39 PM By: Betha Loa Entered By: Betha Loa on 04/22/2023 16:34:51 -------------------------------------------------------------------------------- Multi Wound Chart Details Patient Name: Date of Service: [name], [name] 04/22/2023 3:45 PM Medical Record Number: [id number] Patient Account Number: [id number] Date of Birth/Sex: Treating RN: 03/07/1946 (77 y.o. [name] Primary Care Shervin Cypert: [name] Other Clinician: Betha Loa Referring Tayton Decaire: Treating Decklin Weddington/Extender: [name] in Treatment: 21 Vital Signs Height(in): 69 Pulse(bpm): 90 Weight(lbs): 190 Blood Pressure(mmHg): 151/82 Body Mass Index(BMI): 28.1 Temperature(F): 97.6 Respiratory Rate(breaths/min): 18 [5:Photos:] [N/A:N/A] Left Calcaneus N/A N/A Wound Location: Pressure Injury N/A N/A Wounding Event: Pressure Ulcer N/A N/A Primary Etiology: Coronary Artery Disease, N/A N/A Comorbid History: Hypertension, Type I Diabetes, Osteoarthritis, Received Chemotherapy 10/20/2022 N/A N/A Date Acquired: 21 N/A N/A Weeks of Treatment: Open N/A N/A Wound Status: No N/A N/A Wound Recurrence: 1.2x1.2x1 N/A N/A Measurements L x W x D (cm) 1.131 N/A N/A A (cm) : rea 1.131 N/A N/A Volume (cm) : 76.00% N/A N/A % Reduction in A rea: -140.10% N/A N/A % Reduction in Volume: 9 Starting Position 1 (o'clock): 2 Ending Position 1 (o'clock): 1.2 Maximum Distance 1 (cm): Yes N/A N/A Undermining: Category/Stage  III N/A N/A Classification: Medium N/A N/A Exudate A mount: Serosanguineous N/A N/A Exudate Type: [name], [name] ([id number]) [id number] Page 4 of 7 red, brown N/A N/A Exudate Color: Medium (34-66%) N/A N/A Granulation A mount: Red N/A N/A Granulation Quality: Medium (34-66%) N/A N/A Necrotic A mount: Fat Layer (Subcutaneous Tissue): Yes N/A N/A Exposed Structures: None N/A N/A Epithelialization: Treatment Notes Electronic Signature(s) Signed: 04/22/2023 5:21:39 PM By: Betha Loa Entered By: Betha Loa on 04/22/2023 16:34:56 -------------------------------------------------------------------------------- Multi-Disciplinary Care Plan Details Patient Name: Date of Service: [name], [name] 04/22/2023 3:45 PM Medical Record Number: [id number] Patient Account Number: [id number] Date of Birth/Sex: Treating RN: 10-21-1946 (77 y.o. [name] Primary  Care Youssouf Shipley: [name] Other Clinician: Betha Loa Referring Shontia Gillooly: Treating Keliah Harned/Extender: [name] in Treatment: 21 Active Inactive Pressure Nursing Diagnoses: Potential for impaired tissue integrity related to pressure, friction, moisture, and shear Goals: Patient will remain free of pressure ulcers Date Initiated: 11/26/2022 Target Resolution Date: 04/22/2023 Goal Status: Active Interventions: Assess: immobility, friction, shearing, incontinence upon admission and as needed Assess offloading mechanisms upon admission and as needed Assess potential for pressure ulcer upon admission and as needed Notes: Electronic Signature(s) Signed: 04/22/2023 4:52:35 PM By: Midge Aver MSN RN CNS WTA Signed: 04/22/2023 5:21:39 PM By: Betha Loa Entered By: Betha Loa on 04/22/2023 16:40:29 -------------------------------------------------------------------------------- Negative Pressure Wound Therapy Maintenance (NPWT) Details Patient  Name: Date of Service: [name], [name] 04/22/2023 3:45 PM Medical Record Number: [id number] Patient Account Number: [id number] Date of Birth/Sex: Treating RN: 1946/01/21 (77 y.o. [name] Primary Care Abir Craine: [name] Other Clinician: Betha Loa Referring Seniyah Esker: Treating Zelpha Messing/Extender: [name] in Treatment: 21 NPWT Maintenance Performed for: Wound #5 Left Calcaneus Performed By: Betha Loa, Type: Other Coverage Size (sq cm): 1.44 Pressure Type: Constant Pressure Setting: 125 mmHG Drain Type: None Primary Contact: Other : foam Sponge/Dressing Type: Foam- Blue Date Initiated: 03/13/2023 Dressing Removed: Yes Quantity of Sponges/Gauze Removed: 1 Padovano, [name] ([id number]) [id number] Page 5 of 7 Canister Changed: Yes Canister Exudate Volume: 20 Dressing Reapplied: Yes Quantity of Sponges/Gauze Inserted: 1 Respones T Treatment: o patient tolerated well Days On NPWT : 41 Post Procedure Diagnosis Same as Pre-procedure Electronic Signature(s) Signed: 04/22/2023 5:21:39 PM By: Betha Loa Entered By: Betha Loa on 04/22/2023 16:36:41 -------------------------------------------------------------------------------- Pain Assessment Details Patient Name: Date of Service: [name], [name] 04/22/2023 3:45 PM Medical Record Number: [id number] Patient Account Number: [id number] Date of Birth/Sex: Treating RN: May 12, 1946 (77 y.o. [name] Primary Care Lyliana Dicenso: [name] Other Clinician: Betha Loa Referring Sinclaire Artiga: Treating Pinky Ravan/Extender: [name] in Treatment: 21 Active Problems Location of Pain Severity and Description of Pain Patient Has Paino Yes Site Locations Pain Location: Generalized Pain Duration of the Pain. Constant / Intermittento Constant Rate the pain. Current Pain Level: 3 Character of Pain Describe the Pain:  Aching, Sharp Pain Management and Medication Current Pain Management: Medication: No Cold Application: No Rest: No Massage: No Activity: No T.E.N.S.: No Heat Application: No Leg drop or elevation: No Is the Current Pain Management Adequate: Inadequate How does your wound impact your activities of daily livingo Sleep: No Bathing: No Appetite: No Relationship With Others: No Bladder Continence: No Emotions: No Bowel Continence: No Work: No Toileting: No Drive: No Dressing: No Hobbies: No Electronic Signature(s) Signed: 04/22/2023 4:52:35 PM By: Midge Aver MSN RN CNS WTA Signed: 04/22/2023 5:21:39 PM By: Betha Loa Entered By: Betha Loa on 04/22/2023 15:59:44 [name], [name] ([id number]) [id number] Page 6 of 7 -------------------------------------------------------------------------------- Patient/Caregiver Education Details Patient Name: Date of Service: [name] 6/11/2024andnbsp3:45 PM Medical Record Number: [id number] Patient Account Number: [id number] Date of Birth/Gender: Treating RN: April 30, 1946 (77 y.o. [name] Primary Care Physician: [name] Other Clinician: Betha Loa Referring Physician: Treating Physician/Extender: [name] in Treatment: 21 Education Assessment Education Provided To: Patient Education Topics Provided Wound/Skin Impairment: Handouts: Other: continue wound care as directed Methods: Explain/Verbal Responses: State content correctly Electronic Signature(s) Signed: 04/22/2023 5:21:39 PM By: Betha Loa Entered By: Betha Loa on 04/22/2023 17:16:40 -------------------------------------------------------------------------------- Wound Assessment Details Patient Name: Date of Service: [name], [name] UDE  [name] 04/22/2023 3:45 PM Medical Record Number: [id number] Patient Account Number: [id number] Date of Birth/Sex: Treating RN: 07-Oct-1946 (77  y.o. [name] Primary Care Lynne Righi: [name] Other Clinician: Betha Loa Referring Treyson Axel: Treating Conny Situ/Extender: [name] in Treatment: 21 Wound Status Wound Number: 5 Primary Pressure Ulcer Etiology: Wound Location: Left Calcaneus Wound Open Wounding Event: Pressure Injury Status: Date Acquired: 10/20/2022 Comorbid Coronary Artery Disease, Hypertension, Type I Diabetes, Weeks Of Treatment: 21 History: Osteoarthritis, Received Chemotherapy Clustered Wound: No Photos Wound Measurements Length: (cm) 1.2 Width: (cm) 1.2 Depth: (cm) 1 Area: (cm) 1.131 Volume: (cm) 1.131 [name], [name] ([id number]) Wound Description Classification: Category/Stage III Exudate Amount: Medium Exudate Type: Serosanguineous Exudate Color: red, brown Foul Odor After Cleansing: No Slough/Fibrino Yes % Reduction in Area: 76% % Reduction in Volume: -140.1% Epithelialization: None Undermining: Yes Starting Position (o'clock): 9 Ending Position (o'clock): 2 Maximum Distance: (cm) 1.2 [id number] Page 7 of 7 Wound Bed Granulation Amount: Medium (34-66%) Exposed Structure Granulation Quality: Red Fat Layer (Subcutaneous Tissue) Exposed: Yes Necrotic Amount: Medium (34-66%) Necrotic Quality: Adherent Slough Treatment Notes Wound #5 (Calcaneus) Wound Laterality: Left Cleanser [place]ram Ancillary Kit - 15 Day Supply Discharge Instruction: Use supplies as instructed; Kit contains: (15) Saline Bullets; (15) 3x3 Gauze; 15 pr Gloves Vashe 5.8 (oz) Discharge Instruction: Use vashe 5.8 (oz) as directed Peri-Wound Care Topical Primary Dressing Secondary Dressing Secured With Compression Wrap Urgo K2 Lite, two layer compression system, large Compression Stockings Add-Ons Electronic Signature(s) Signed: 04/22/2023 4:52:35 PM By: Midge Aver MSN RN CNS WTA Signed: 04/22/2023 5:21:39 PM By: Betha Loa Entered By:  Betha Loa on 04/22/2023 16:10:58 -------------------------------------------------------------------------------- Vitals Details Patient Name: Date of Service: [name], [name] 04/22/2023 3:45 PM Medical Record Number: [id number] Patient Account Number: [id number] Date of Birth/Sex: Treating RN: [birth year] (77 y.o. [name] Primary Care Dejay Kronk: [name] Other Clinician: Betha Loa Referring Adib Wahba: Treating Tamar Lipscomb/Extender: [name] in Treatment: 21 Vital Signs Time Taken: 15:57 Temperature (F): 97.6 Height (in): 69 Pulse (bpm): 90 Weight (lbs): 190 Respiratory Rate (breaths/min): 18 Body Mass Index (BMI): 28.1 Blood Pressure (mmHg): 151/82 Reference Range: 80 - 120 mg / dl Electronic Signature(s) Signed: 04/22/2023 5:21:39 PM By: Betha Loa Entered By: Betha Loa on 04/22/2023 15:59:40

## 2023-04-23 NOTE — Progress Notes (Signed)
RICCO, PILOTO ([id number]) [id number] Page 1 of 8 Visit Report for 04/22/2023 Chief Complaint Document Details Patient Name: Date of Service: [name], [name] 04/22/2023 3:45 PM Medical Record Number: [id number] Patient Account Number: [id number] Date of Birth/Sex: Treating RN: 05-07-1946 (77 y.o. [name] Primary Care Provider: Jerl [name] Other Clinician: Betha [name] Referring Provider: Treating Provider/Extender: [name] in Treatment: 21 Information Obtained from: Patient Chief Complaint Left heel pressure ulcer Electronic Signature(s) Signed: 04/22/2023 5:11:14 PM By: [name] [name] Entered By: [name] on 04/22/2023 17:11:14 -------------------------------------------------------------------------------- HPI Details Patient Name: Date of Service: [name], [name] 04/22/2023 3:45 PM Medical Record Number: [id number] Patient Account Number: [id number] Date of Birth/Sex: Treating RN: 10-06-1946 (77 y.o. [name] Primary Care Provider: Jerl [name] Other Clinician: Betha [name] Referring Provider: Treating Provider/Extender: [name] in Treatment: 21 History of Present Illness HPI Description: 77 year old male recently seen by his PCP [name] who saw him for a wound on the right leg which is less tender and he is continuing to use bacitracin and has completed her course of doxycycline. Past medical history of coronary artery disease, diabetes mellitus type 1, hyperlipidemia, hypertension, MI, plantar fascial fibromatosis, pneumonia, status post foot surgery due to trauma on the left side, coronary angioplasty. he is a former smoker and quit in July 1996. last hemoglobin A1c was 7.8 % in April of this year 5/22 2017 -- biopsy of the right lower extremity was sent for pathology and the report is that of a basal cell carcinoma with edges of the  biopsy are involved. I have called the patient over the phone( 03/28/16) and discussed the pathology report with him and he is agreeable to see a surgeon for excision and need full. I have also spoken personally to [name], of [facility] surgical and referred the patient for further wide excision of this lesion and have communicated this to the patient. READMISSION 08/12/18 This is a now 77 year old man with type 1 diabetes. Hemoglobin A1c on 4/29 was 7.9. He is listed as having a history of PAD in the [place] records although the patient doesn't recall this, doesn't complain of any symptoms of claudication and doesn't believe he has had any prior noninvasive arterial test. He is a nonsmoker. He was here in 2017 with a wound on his Right lower leg. This was biopsied in the clinic and pathology showed a basal cell carcinoma. He went on to have surgery on this area this is currently where I believe his current wounds are located. He generally bumped the lateral part of his right calf 2 weeks ago and has a superficial abrasion. He also has 2 small open areas on the medial part of the tibia in the same area. Our intake nurse noted a stitch coming out of this which was removed. These of the wound sees actually come to the clinic for. However the more concerning area is an area on the tip of the left great toe. He says that this was initially traumatic and he's been to see Dr. Dory [name] podiatrist. [name] been using what I think is Santyl to the wound tip. This has some depth. ABIs in this clinic were non-obtainable on the right and 1.84 on the left. 08/19/18; the patient had arterial studies that did not show evidence of significant hemodynamically compromising occlusions in either leg. There was mild medial atherosclerosis bilaterally. His resting ABIs were within the normal limits at 1.3  on the right and 1.4 on the left. There was normal posterior and anterior tibial artery waveforms and velocities listed  on both sides. The patient arrives with what appears to be a healthy surface over the tip of his left great toe. This is indeed surprising. Still looks somewhat friable however. More concerning is the x-ray that we did that showed erosions of the tuft of the distal phalanx the left great toe consistent with osteomyelitis. I attempted to pull this x-ray up on colon health Link however I can't open the film to look at this myself. 08/26/18; I reviewed the patient's x-ray and colon helpline. Indeed there is fairly obvious erosion of the tip of his left great toe. The wound was initially a traumatic area hitting it sometime in the middle of night in his kitchen. He is a type I diabetic. I have him on Flagyl and Levaquin that I started last week i.e. 7 days ago. He has an appointment with infectious disease on 09/01/18 09/02/18; the patient was seen by Dr. Rivka [name] of infectious disease. She did not feel the patient needed IV antibiotics. I do feel he needs further oral antibiotics however. He does not need anymore Flagyl but he does need another 2 weeks of Levaquin. The areas just about closed. 09/16/18; the patient is completing his Levaquin today. There is no open wound on the tip of the toe Readmission: MAXIMAS, REISDORF ([id number]) [id number] Page 2 of 8 11-26-2022 upon evaluation today patient presents for initial inspection here in our clinic concerning issues that he has been having after having been hospitalized due to septic arthritis. Subsequently he has a PICC line that is been removed and he has been discharged from infectious disease in that regard. Unfortunately he developed shearing wounds over the gluteal region as well as a pressure ulcer over each heel although they are not looking extremely bad nonetheless they do seem to be evidence of pressure which she sustained while hospitalized. Obviously he was very sick and is still recovering as far as that is  concerned. Fortunately there does not appear to be any evidence of active infection systemically which is good news at this point according to Dr. [name]. Patient's wounds also do not appear to be too bad at this point which is good news. Patient does have a history of type 1 diabetes mellitus, peripheral vascular disease, coronary artery disease, congestive heart failure, hypertension, and he is on long-term anticoagulant therapy. 12-03-2022 upon evaluation today patient appears to be doing better to some degree in regard to his ulcers on his heels at this point. We are still waiting on the actual appointment with the vascular surgeons but nonetheless in the meantime I do believe that he is making some good progress currently with regard to loosen up the eschar. I did actually feel like that there was some ability for [place] to remove some of the necrotic tissue today and I discussed that with the patient. He is in agreement with that plan. We also can probably see about making an adjustment in the treatment regimen. 12-10-2022 upon evaluation today patient's wounds were really doing about the same. Again he is still awaiting the evaluation with the vascular surgeon. That should have already been done but had to be pushed back unfortunately. He will be seeing them next week on the seventh. I plan to see him following somewhere around the end of the week I think will probably be best. The patient voiced understanding. 12-19-2022 upon evaluation today  patient appears to be doing well currently in regard to his wound we did get the results of his arterial studies which show that he has excellent ABIs with no signs of vascular compromise. For that reason I will go ahead and perform some debridement today to clearway the necrotic debris with his Eliquis that I will be too aggressive so this will still be a stepwise process but we are to go ahead and get that started. 12-27-2022 upon evaluation today patient  appears to be doing well currently in regard to his wounds. They are actually showing signs of excellent improvement. There is some necrotic tissue noted on the surface of wounds to work on clearing some of that away today also I think that we may switch to [facility] dressing to see how things do going forward. He is in agreement with that plan. Subsequently I am extremely pleased with where we stand currently. No fevers, chills, nausea, vomiting, or diarrhea. 01-02-2023 upon evaluation today patient appears to be doing much better in regard to his wounds the right foot appears to be almost healed the left foot though not completely healed is significantly better. Fortunately there does not appear to be any signs of active infection at this point. 01-10-2023 upon evaluation today patient appears to be doing well currently in regard to his wounds. The right heel actually is completely healed the left heel is definitely headed in the right direction and looks to be doing awesome. I do not see any signs of active infection locally nor systemically at this time which is great news. 3/7; his right heel has remained healed. There is no evidence of active infection. The remaining wound is on the lateral aspect of the heel just above the plantar surface 01-30-2023 upon evaluation today patient appears to be doing well currently in regard to his wound. The heel actually showing signs of improvement which is great news and fortunately I do not see any evidence of active infection locally nor systemically at this time. Overall I think that we are headed in the right direction which is excellent here. No fevers, chills, nausea, vomiting, or diarrhea. I am good have to perform some sharp debridement today. This is just the left heel that is remaining open. 02-06-2023 upon evaluation today patient appears to be doing well currently in regard to his wound. Has been tolerating the dressing changes  without complication. Fortunately there does not appear to be any signs of active infection locally nor systemically although he does have some need for sharp debridement with some definite necrotic tissue at the base of the wound today. 02-13-2023 upon evaluation today patient appears to be doing well currently in regard to his wound which I feel like is slowly clearing up. I did review his growth labs as well the sed rate and C-reactive protein are down his white blood cell count was normal his hemoglobin is coming up. He did see Dr. Joylene Draft on Tuesday and she did continue him on the doxycycline considering his lab numbers but nonetheless he seems to be making some pretty good progress here in my opinion. The wound is slowly turnaround serial debridements are obviously helping with that being said I do believe that he is going to still require dressing changes and close monitoring. 02-20-2023 upon evaluation today patient's wound actually appears to be making some slight progress. This is slowly but surely moving into the right direction. Fortunately I do not see any signs of active infection at this  time. 02-27-2023 upon evaluation today patient appears to be doing well currently in regard to his wound. He has been tolerating the dressing changes. I think the Hydrofera Blue is doing a good job and he seems to be doing well with that currently. 03-06-2023 upon evaluation today patient appears to be doing well currently in regard to his wounds were actually seeing signs of improvement which is great news. Fortunately I do not see any evidence of active infection locally nor systemically which is great news and overall I do believe that we are moving in the right direction. 03-13-2023 upon evaluation today patient's wound is actually showing signs of improvement. We actually do have a snap VAC with the bridge today and I am hopeful that this will actually be beneficial for him. Will give this a shot and  see how things do. 03-17-2023 upon evaluation today patient comes in early due to having issues with the snap VAC will need to not maintain a seal. Subsequently we did end up seeing an and removing the snap VAC due to the fact that there is a lot of maceration and drainage around the wound itself. Upon removal however the wound appears to be doing excellent and in fact was in much better shape that even when I saw her last Thursday. 03-20-2023 upon evaluation today patient appears to be doing well currently in regard to his wound is showing more signs of granulation which is great news and overall I am extremely pleased with where we stand today. I do not see any evidence of active infection locally nor systemically which is great news. No fevers, chills, nausea, vomiting, or diarrhea. 03-28-2023 upon evaluation today patient appears to be doing well currently in regard to his wound although he unfortunately is again macerated. I think a big portion of this is his leg swelling they were unable to really use the juxta lite appropriately over top of the snap VAC. For that reason his leg is extremely swollen as he has not really had any compression on over the past week. This obviously is not good and I discussed that with the patient today as well. I would recommend we take a break for a week yet again and I think that at this point we may need to actually compression wrap him. If organ to continue with the snap VAC I think he needs to be underneath the compression wrap. 04-03-2023 upon evaluation today patient actually appears to be making good progress in my opinion in regard to his leg wound more specifically on the heel. He has been tolerating the dressing changes without complication and I actually feel like with the compression wrapping not only is his leg smaller but this looks much better compared to last week's evaluation. Fortunately I do not see any evidence of active infection locally nor  systemically which is great news. 04-10-2023 upon evaluation today patient appears to be doing well currently in regard to his wound. He is actually tolerating the dressing changes without complication and very pleased in that regard. Fortunately I do not see any signs of active infection at this time. I do think organ to try to go back to the snap VAC today with the compression wrap. 04-15-2023 upon evaluation today patient appears to be doing well currently in regard to his wound. The snap VAC seems to be doing an excellent job. I am actually extremely pleased with where we stand. 04-22-2023 upon evaluation today patient appears to be doing well currently  in regard to his wound which is actually showing signs of improvement. Fortunately I KYREN, KNICK ([id number]) [id number] Page 3 of 8 do not see any evidence of active infection locally nor systemically which is great news. No fevers, chills, nausea, vomiting, or diarrhea. Electronic Signature(s) Signed: 04/22/2023 5:13:29 PM By: [name] [name] Entered By: [name] on 04/22/2023 17:13:29 -------------------------------------------------------------------------------- Physical Exam Details Patient Name: Date of Service: [name], [name] 04/22/2023 3:45 PM Medical Record Number: [id number] Patient Account Number: [id number] Date of Birth/Sex: Treating RN: [birth year] (77 y.o. [name] Primary Care Provider: Jerl [name] Other Clinician: Betha [name] Referring Provider: Treating Provider/Extender: [name] in Treatment: 21 Constitutional Well-nourished and well-hydrated in no acute distress. Respiratory normal breathing without difficulty. Psychiatric this patient is able to make decisions and demonstrates good insight into disease process. Alert and Oriented x 3. pleasant and cooperative. Notes Upon inspection patient's wound bed showed evidence of good  granulation and epithelization at this point. Fortunately I do not see any signs of active infection locally nor systemically which is excellent and overall I do believe that removing in the right direction here. I think the snap VAC is really doing a great job with his wound. Electronic Signature(s) Signed: 04/22/2023 5:13:53 PM By: [name] [name] Entered By: [name] on 04/22/2023 17:13:53 -------------------------------------------------------------------------------- Physician Orders Details Patient Name: Date of Service: [name], [name] 04/22/2023 3:45 PM Medical Record Number: [id number] Patient Account Number: [id number] Date of Birth/Sex: Treating RN: 1946-04-22 (77 y.o. [name] Primary Care Provider: Jerl [name] Other Clinician: Betha [name] Referring Provider: Treating Provider/Extender: [name] in Treatment: 21 Verbal / Phone Orders: Yes Clinician: Midge Aver Read Back and Verified: Yes Diagnosis Coding Follow-up Appointments ppointment in 1 week. - Tuesday to check snap vac and re wrap Return A Nurse Visit as needed Bathing/ Shower/ Hygiene May shower with wound dressing protected with water repellent cover or cast protector. No tub bath. - no soaking foot in tub Anesthetic (Use 'Patient Medications' Section for Anesthetic Order Entry) Lidocaine applied to wound bed Edema Control - Lymphedema / Segmental Compressive Device / Other Patient to wear own compression stockings. Remove compression stockings every night before going to bed and put on every morning when getting up. - pt has juxtafit velcro wrap-ON HOLD AT THIS TIME Elevate, Exercise Daily and A void Standing for Long Periods of Time. Elevate legs to the level of the heart and pump ankles as often as possible Elevate leg(s) parallel to the floor when sitting. DO YOUR BEST to sleep in the bed at night. DO NOT sleep in your recliner. Long hours of sitting in a  recliner leads to swelling of the legs and/or potential wounds on your backside. Negative Pressure Wound Therapy Snap Vac applied Wound VAC settings at continuous pressure. Use foam to wound cavity. Please order WHITE foam to fill any tunnel/s and/or undermining when necessary. Change VAC dressing 2 X WEEK. Change canister as indicated when full. RAYAN, INES ([id number]) [id number] Page 4 of 8 Number of foam/gauze pieces used in the dressing = - 2, one blue foam in wound bed, one foam piece in bridge. NO BRIDGE AVAILABLE AT VISIT APPLY REGULAR SNAP VAC , Wound Treatment Wound #5 - Calcaneus Wound Laterality: Left Cleanser: Byram Ancillary Kit - 15 Day Supply (Generic) 1 x Per Week/30 Days Discharge Instructions: Use supplies as instructed; Kit contains: (15) Saline Bullets; (15) 3x3 Gauze; 15 pr  Gloves Cleanser: Vashe 5.8 (oz) 1 x Per Week/30 Days Discharge Instructions: Use vashe 5.8 (oz) as directed Compression Wrap: Urgo K2 Lite, two layer compression system, large 1 x Per Week/30 Days Electronic Signature(s) Signed: 04/22/2023 5:21:39 PM By: [name] Signed: 04/22/2023 6:26:43 PM By: [name] [name] Entered By: [name] on 04/22/2023 16:39:35 -------------------------------------------------------------------------------- Problem List Details Patient Name: Date of Service: [name], [name] 04/22/2023 3:45 PM Medical Record Number: [id number] Patient Account Number: [id number] Date of Birth/Sex: Treating RN: Feb 13, 1946 (77 y.o. [name] Primary Care Provider: Jerl [name] Other Clinician: Betha [name] Referring Provider: Treating Provider/Extender: [name] in Treatment: 21 Active Problems ICD-10 Encounter Code Description Active Date MDM Diagnosis E10.621 Type 1 diabetes mellitus with foot ulcer 11/26/2022 No Yes I73.89 Other specified peripheral vascular diseases 11/26/2022 No  Yes L89.623 Pressure ulcer of left heel, stage 3 11/26/2022 No Yes L89.613 Pressure ulcer of right heel, stage 3 11/26/2022 No Yes L98.412 Non-pressure chronic ulcer of buttock with fat layer exposed 11/26/2022 No Yes I25.10 Atherosclerotic heart disease of native coronary artery without angina pectoris 11/26/2022 No Yes I50.42 Chronic combined systolic (congestive) and diastolic (congestive) heart failure 11/26/2022 No Yes I10 Essential (primary) hypertension 11/26/2022 No Yes Inactive Problems Resolved Problems CAYLOB, CHAVANA ([id number]) [id number] Page 5 of 8 Electronic Signature(s) Signed: 04/22/2023 5:09:13 PM By: [name] [name] Entered By: [name] on 04/22/2023 17:09:12 -------------------------------------------------------------------------------- Progress Note Details Patient Name: Date of Service: [name], [name] 04/22/2023 3:45 PM Medical Record Number: [id number] Patient Account Number: [id number] Date of Birth/Sex: Treating RN: Oct 07, 1946 (77 y.o. [name] Primary Care Provider: Jerl [name] Other Clinician: Betha [name] Referring Provider: Treating Provider/Extender: [name] in Treatment: 21 Subjective Chief Complaint Information obtained from Patient Left heel pressure ulcer History of Present Illness (HPI) 77 year old male recently seen by his PCP [name] who saw him for a wound on the right leg which is less tender and he is continuing to use bacitracin and has completed her course of doxycycline. Past medical history of coronary artery disease, diabetes mellitus type 1, hyperlipidemia, hypertension, MI, plantar fascial fibromatosis, pneumonia, status post foot surgery due to trauma on the left side, coronary angioplasty. he is a former smoker and quit in July 1996. last hemoglobin A1c was 7.8 % in April of this year 5/22 2017 -- biopsy of the right lower extremity was sent for  pathology and the report is that of a basal cell carcinoma with edges of the biopsy are involved. I have called the patient over the phone( 03/28/16) and discussed the pathology report with him and he is agreeable to see a surgeon for excision and need full. I have also spoken personally to [name], of [facility] surgical and referred the patient for further wide excision of this lesion and have communicated this to the patient. READMISSION 08/12/18 This is a now 77 year old man with type 1 diabetes. Hemoglobin A1c on 4/29 was 7.9. He is listed as having a history of PAD in the [place] records although the patient doesn't recall this, doesn't complain of any symptoms of claudication and doesn't believe he has had any prior noninvasive arterial test. He is a nonsmoker. He was here in 2017 with a wound on his Right lower leg. This was biopsied in the clinic and pathology showed a basal cell carcinoma. He went on to have surgery on this area this is currently where I believe his current wounds  are located. He generally bumped the lateral part of his right calf 2 weeks ago and has a superficial abrasion. He also has 2 small open areas on the medial part of the tibia in the same area. Our intake nurse noted a stitch coming out of this which was removed. These of the wound sees actually come to the clinic for. However the more concerning area is an area on the tip of the left great toe. He says that this was initially traumatic and he's been to see Dr. Dory [name] podiatrist. [name] been using what I think is Santyl to the wound tip. This has some depth. ABIs in this clinic were non-obtainable on the right and 1.84 on the left. 08/19/18; the patient had arterial studies that did not show evidence of significant hemodynamically compromising occlusions in either leg. There was mild medial atherosclerosis bilaterally. His resting ABIs were within the normal limits at 1.3 on the right and 1.4 on the left. There was  normal posterior and anterior tibial artery waveforms and velocities listed on both sides. The patient arrives with what appears to be a healthy surface over the tip of his left great toe. This is indeed surprising. Still looks somewhat friable however. More concerning is the x-ray that we did that showed erosions of the tuft of the distal phalanx the left great toe consistent with osteomyelitis. I attempted to pull this x-ray up on colon health Link however I can't open the film to look at this myself. 08/26/18; I reviewed the patient's x-ray and colon helpline. Indeed there is fairly obvious erosion of the tip of his left great toe. The wound was initially a traumatic area hitting it sometime in the middle of night in his kitchen. He is a type I diabetic. I have him on Flagyl and Levaquin that I started last week i.e. 7 days ago. He has an appointment with infectious disease on 09/01/18 09/02/18; the patient was seen by Dr. Rivka [name] of infectious disease. She did not feel the patient needed IV antibiotics. I do feel he needs further oral antibiotics however. He does not need anymore Flagyl but he does need another 2 weeks of Levaquin. The areas just about closed. 09/16/18; the patient is completing his Levaquin today. There is no open wound on the tip of the toe Readmission: 11-26-2022 upon evaluation today patient presents for initial inspection here in our clinic concerning issues that he has been having after having been hospitalized due to septic arthritis. Subsequently he has a PICC line that is been removed and he has been discharged from infectious disease in that regard. Unfortunately he developed shearing wounds over the gluteal region as well as a pressure ulcer over each heel although they are not looking extremely bad nonetheless they do seem to be evidence of pressure which she sustained while hospitalized. Obviously he was very sick and is still recovering as far as that is  concerned. Fortunately there does not appear to be any evidence of active infection systemically which is good news at this point according to Dr. [name]. Patient's wounds also do not appear to be too bad at this point which is good news. Patient does have a history of type 1 diabetes mellitus, peripheral vascular disease, coronary artery disease, congestive heart failure, hypertension, and he is on long-term anticoagulant therapy. 12-03-2022 upon evaluation today patient appears to be doing better to some degree in regard to his ulcers on his heels at this point. We are still waiting on the  actual appointment with the vascular surgeons but nonetheless in the meantime I do believe that he is making some good progress currently with regard to loosen up the eschar. I did actually feel like that there was some ability for [place] to remove some of the necrotic tissue today and I discussed that with the patient. He is in agreement with that plan. We also can probably see about making an adjustment in the treatment regimen. 12-10-2022 upon evaluation today patient's wounds were really doing about the same. Again he is still awaiting the evaluation with the vascular surgeon. That should have already been done but had to be pushed back unfortunately. He will be seeing them next week on the seventh. I plan to see him following somewhere around the end of the week I think will probably be best. The patient voiced understanding. 12-19-2022 upon evaluation today patient appears to be doing well currently in regard to his wound we did get the results of his arterial studies which show that he has excellent ABIs with no signs of vascular compromise. For that reason I will go ahead and perform some debridement today to clearway the necrotic debris NAYEL, WARNCKE ([id number]) [phone].[id number] Page 6 of 8 with his Eliquis that I will be too aggressive so this will still be a stepwise process  but we are to go ahead and get that started. 12-27-2022 upon evaluation today patient appears to be doing well currently in regard to his wounds. They are actually showing signs of excellent improvement. There is some necrotic tissue noted on the surface of wounds to work on clearing some of that away today also I think that we may switch to [facility] dressing to see how things do going forward. He is in agreement with that plan. Subsequently I am extremely pleased with where we stand currently. No fevers, chills, nausea, vomiting, or diarrhea. 01-02-2023 upon evaluation today patient appears to be doing much better in regard to his wounds the right foot appears to be almost healed the left foot though not completely healed is significantly better. Fortunately there does not appear to be any signs of active infection at this point. 01-10-2023 upon evaluation today patient appears to be doing well currently in regard to his wounds. The right heel actually is completely healed the left heel is definitely headed in the right direction and looks to be doing awesome. I do not see any signs of active infection locally nor systemically at this time which is great news. 3/7; his right heel has remained healed. There is no evidence of active infection. The remaining wound is on the lateral aspect of the heel just above the plantar surface 01-30-2023 upon evaluation today patient appears to be doing well currently in regard to his wound. The heel actually showing signs of improvement which is great news and fortunately I do not see any evidence of active infection locally nor systemically at this time. Overall I think that we are headed in the right direction which is excellent here. No fevers, chills, nausea, vomiting, or diarrhea. I am good have to perform some sharp debridement today. This is just the left heel that is remaining open. 02-06-2023 upon evaluation today patient appears to be doing well  currently in regard to his wound. Has been tolerating the dressing changes without complication. Fortunately there does not appear to be any signs of active infection locally nor systemically although he does have some need for sharp debridement with some definite necrotic tissue  at the base of the wound today. 02-13-2023 upon evaluation today patient appears to be doing well currently in regard to his wound which I feel like is slowly clearing up. I did review his growth labs as well the sed rate and C-reactive protein are down his white blood cell count was normal his hemoglobin is coming up. He did see Dr. Joylene Draft on Tuesday and she did continue him on the doxycycline considering his lab numbers but nonetheless he seems to be making some pretty good progress here in my opinion. The wound is slowly turnaround serial debridements are obviously helping with that being said I do believe that he is going to still require dressing changes and close monitoring. 02-20-2023 upon evaluation today patient's wound actually appears to be making some slight progress. This is slowly but surely moving into the right direction. Fortunately I do not see any signs of active infection at this time. 02-27-2023 upon evaluation today patient appears to be doing well currently in regard to his wound. He has been tolerating the dressing changes. I think the Hydrofera Blue is doing a good job and he seems to be doing well with that currently. 03-06-2023 upon evaluation today patient appears to be doing well currently in regard to his wounds were actually seeing signs of improvement which is great news. Fortunately I do not see any evidence of active infection locally nor systemically which is great news and overall I do believe that we are moving in the right direction. 03-13-2023 upon evaluation today patient's wound is actually showing signs of improvement. We actually do have a snap VAC with the bridge today and I  am hopeful that this will actually be beneficial for him. Will give this a shot and see how things do. 03-17-2023 upon evaluation today patient comes in early due to having issues with the snap VAC will need to not maintain a seal. Subsequently we did end up seeing an and removing the snap VAC due to the fact that there is a lot of maceration and drainage around the wound itself. Upon removal however the wound appears to be doing excellent and in fact was in much better shape that even when I saw her last Thursday. 03-20-2023 upon evaluation today patient appears to be doing well currently in regard to his wound is showing more signs of granulation which is great news and overall I am extremely pleased with where we stand today. I do not see any evidence of active infection locally nor systemically which is great news. No fevers, chills, nausea, vomiting, or diarrhea. 03-28-2023 upon evaluation today patient appears to be doing well currently in regard to his wound although he unfortunately is again macerated. I think a big portion of this is his leg swelling they were unable to really use the juxta lite appropriately over top of the snap VAC. For that reason his leg is extremely swollen as he has not really had any compression on over the past week. This obviously is not good and I discussed that with the patient today as well. I would recommend we take a break for a week yet again and I think that at this point we may need to actually compression wrap him. If organ to continue with the snap VAC I think he needs to be underneath the compression wrap. 04-03-2023 upon evaluation today patient actually appears to be making good progress in my opinion in regard to his leg wound more specifically on the heel. He has  been tolerating the dressing changes without complication and I actually feel like with the compression wrapping not only is his leg smaller but this looks much better compared to last week's  evaluation. Fortunately I do not see any evidence of active infection locally nor systemically which is great news. 04-10-2023 upon evaluation today patient appears to be doing well currently in regard to his wound. He is actually tolerating the dressing changes without complication and very pleased in that regard. Fortunately I do not see any signs of active infection at this time. I do think organ to try to go back to the snap VAC today with the compression wrap. 04-15-2023 upon evaluation today patient appears to be doing well currently in regard to his wound. The snap VAC seems to be doing an excellent job. I am actually extremely pleased with where we stand. 04-22-2023 upon evaluation today patient appears to be doing well currently in regard to his wound which is actually showing signs of improvement. Fortunately I do not see any evidence of active infection locally nor systemically which is great news. No fevers, chills, nausea, vomiting, or diarrhea. Objective Constitutional Well-nourished and well-hydrated in no acute distress. Vitals Time Taken: 3:57 PM, Height: 69 in, Weight: 190 lbs, BMI: 28.1, Temperature: 97.6 F, Pulse: 90 bpm, Respiratory Rate: 18 breaths/min, Blood Pressure: 151/82 mmHg. Respiratory OSA, BARTNIK ([id number]) [id number] Page 7 of 8 normal breathing without difficulty. Psychiatric this patient is able to make decisions and demonstrates good insight into disease process. Alert and Oriented x 3. pleasant and cooperative. General Notes: Upon inspection patient's wound bed showed evidence of good granulation and epithelization at this point. Fortunately I do not see any signs of active infection locally nor systemically which is excellent and overall I do believe that removing in the right direction here. I think the snap VAC is really doing a great job with his wound. Integumentary (Hair, Skin) Wound #5 status is Open. Original cause  of wound was Pressure Injury. The date acquired was: 10/20/2022. The wound has been in treatment 21 weeks. The wound is located on the Left Calcaneus. The wound measures 1.2cm length x 1.2cm width x 1cm depth; 1.131cm^2 area and 1.131cm^3 volume. There is Fat Layer (Subcutaneous Tissue) exposed. There is undermining starting at 9:00 and ending at 2:00 with a maximum distance of 1.2cm. There is a medium amount of serosanguineous drainage noted. There is medium (34-66%) red granulation within the wound bed. There is a medium (34-66%) amount of necrotic tissue within the wound bed including Adherent Slough. Assessment Active Problems ICD-10 Type 1 diabetes mellitus with foot ulcer Other specified peripheral vascular diseases Pressure ulcer of left heel, stage 3 Pressure ulcer of right heel, stage 3 Non-pressure chronic ulcer of buttock with fat layer exposed Atherosclerotic heart disease of native coronary artery without angina pectoris Chronic combined systolic (congestive) and diastolic (congestive) heart failure Essential (primary) hypertension Plan Follow-up Appointments: Return Appointment in 1 week. - Tuesday to check snap vac and re wrap Nurse Visit as needed Bathing/ Shower/ Hygiene: May shower with wound dressing protected with water repellent cover or cast protector. No tub bath. - no soaking foot in tub Anesthetic (Use 'Patient Medications' Section for Anesthetic Order Entry): Lidocaine applied to wound bed Edema Control - Lymphedema / Segmental Compressive Device / Other: Patient to wear own compression stockings. Remove compression stockings every night before going to bed and put on every morning when getting up. - pt has juxtafit velcro wrap-ON HOLD AT THIS  TIME Elevate, Exercise Daily and Avoid Standing for Long Periods of Time. Elevate legs to the level of the heart and pump ankles as often as possible Elevate leg(s) parallel to the floor when sitting. DO YOUR BEST to  sleep in the bed at night. DO NOT sleep in your recliner. Long hours of sitting in a recliner leads to swelling of the legs and/or potential wounds on your backside. Negative Pressure Wound Therapy: Snap Vac applied Wound VAC settings at continuous pressure. Use foam to wound cavity. Please order WHITE foam to fill any tunnel/s and/or undermining when necessary. Change VAC dressing 2 X WEEK. Change canister as indicated when full. Number of foam/gauze pieces used in the dressing = - 2, one blue foam in wound bed, one foam piece in bridge. NO BRIDGE AVAILABLE AT VISIT APPLY , REGULAR SNAP VAC WOUND #5: - Calcaneus Wound Laterality: Left Cleanser: Byram Ancillary Kit - 15 Day Supply (Generic) 1 x Per Week/30 Days Discharge Instructions: Use supplies as instructed; Kit contains: (15) Saline Bullets; (15) 3x3 Gauze; 15 pr Gloves Cleanser: Vashe 5.8 (oz) 1 x Per Week/30 Days Discharge Instructions: Use vashe 5.8 (oz) as directed Com pression Wrap: Urgo K2 Lite, two layer compression system, large 1 x Per Week/30 Days 1. I am Going to recommend that we have the patient continue to be signs of infection or worsening. Based on what I am seeing I do believe that we are moving in the appropriate direction here. 2. I am good recommend as well that he should continue to monitor for any signs of infection or worsening. Based on I am seeing right now I think he is really doing quite well and I think along with the compression wrapping this snapback is doing an awesome job. We will see patient back for reevaluation in 1 week here in the clinic. If anything worsens or changes patient will contact our office for additional recommendations. Electronic Signature(s) Signed: 04/22/2023 5:17:23 PM By: [name] [name] Entered By: [name] on 04/22/2023 17:17:23 Heavner, [name] ([id number]) [id number] Page 8 of  8 -------------------------------------------------------------------------------- SuperBill Details Patient Name: Date of Service: [name], [name] 04/22/2023 Medical Record Number: [id number] Patient Account Number: [id number] Date of Birth/Sex: Treating RN: 02/04/1946 (77 y.o. [name] Primary Care Provider: Jerl [name] Other Clinician: Betha [name] Referring Provider: Treating Provider/Extender: [name] in Treatment: 21 Diagnosis Coding ICD-10 Codes Code Description [phone] Type 1 diabetes mellitus with foot ulcer I73.89 Other specified peripheral vascular diseases L89.623 Pressure ulcer of left heel, stage 3 L89.613 Pressure ulcer of right heel, stage 3 L98.412 Non-pressure chronic ulcer of buttock with fat layer exposed I25.10 Atherosclerotic heart disease of native coronary artery without angina pectoris I50.42 Chronic combined systolic (congestive) and diastolic (congestive) heart failure I10 Essential (primary) hypertension Facility Procedures : CPT4 Code: [id number] Description: 42595 Single Use NEG PRESS WND TX <=50 SQ CM Modifier: Quantity: 1 Physician Procedures : CPT4 Code Description Modifier [id number] - WC PHYS LEVEL 4 - EST PT ICD-10 Diagnosis Description E10.621 Type 1 diabetes mellitus with foot ulcer I73.89 Other specified peripheral vascular diseases L89.623 Pressure ulcer of left heel, stage 3  L89.613 Pressure ulcer of right heel, stage 3 Quantity: 1 Electronic Signature(s) Signed: 04/22/2023 5:17:38 PM By: [name] [name] Entered By: [name] on 04/22/2023 17:17:38

## 2023-04-24 DIAGNOSIS — M47816 Spondylosis without myelopathy or radiculopathy, lumbar region: Secondary | ICD-10-CM | POA: Diagnosis not present

## 2023-04-24 DIAGNOSIS — M5451 Vertebrogenic low back pain: Secondary | ICD-10-CM | POA: Diagnosis not present

## 2023-04-25 ENCOUNTER — Other Ambulatory Visit: Payer: Self-pay | Admitting: Rehabilitation

## 2023-04-25 DIAGNOSIS — M5451 Vertebrogenic low back pain: Secondary | ICD-10-CM

## 2023-04-25 DIAGNOSIS — M47816 Spondylosis without myelopathy or radiculopathy, lumbar region: Secondary | ICD-10-CM

## 2023-04-29 ENCOUNTER — Encounter: Payer: Medicare HMO | Admitting: Physician Assistant

## 2023-04-29 DIAGNOSIS — L89613 Pressure ulcer of right heel, stage 3: Secondary | ICD-10-CM | POA: Diagnosis not present

## 2023-04-29 DIAGNOSIS — E10622 Type 1 diabetes mellitus with other skin ulcer: Secondary | ICD-10-CM | POA: Diagnosis not present

## 2023-04-29 DIAGNOSIS — I2510 Atherosclerotic heart disease of native coronary artery without angina pectoris: Secondary | ICD-10-CM | POA: Diagnosis not present

## 2023-04-29 DIAGNOSIS — I110 Hypertensive heart disease with heart failure: Secondary | ICD-10-CM | POA: Diagnosis not present

## 2023-04-29 DIAGNOSIS — L98412 Non-pressure chronic ulcer of buttock with fat layer exposed: Secondary | ICD-10-CM | POA: Diagnosis not present

## 2023-04-29 DIAGNOSIS — L89623 Pressure ulcer of left heel, stage 3: Secondary | ICD-10-CM | POA: Diagnosis not present

## 2023-04-29 DIAGNOSIS — E1051 Type 1 diabetes mellitus with diabetic peripheral angiopathy without gangrene: Secondary | ICD-10-CM | POA: Diagnosis not present

## 2023-04-29 DIAGNOSIS — I5042 Chronic combined systolic (congestive) and diastolic (congestive) heart failure: Secondary | ICD-10-CM | POA: Diagnosis not present

## 2023-04-29 NOTE — Progress Notes (Addendum)
[name] ([id number]) [id number] Page 1 of 9 Visit Report for 04/29/2023 Chief Complaint Document Details Patient Name: Date of Service: [name] [name] 04/29/2023 1:00 PM Medical Record Number: [id number] Patient Account Number: [id number] Date of Birth/Sex: Treating RN: 1946/06/03 (77 y.o. [name] Primary Care Provider: Jerl [name] Other Clinician: Referring Provider: Treating Provider/Extender: [name] in Treatment: 22 Information Obtained from: Patient Chief Complaint Left heel pressure ulcer Electronic Signature(s) Signed: 04/29/2023 1:38:44 PM By: [name] [name] Entered By: [name] on 04/29/2023 13:38:44 -------------------------------------------------------------------------------- HPI Details Patient Name: Date of Service: [name] [name] 04/29/2023 1:00 PM Medical Record Number: [id number] Patient Account Number: [id number] Date of Birth/Sex: Treating RN: 01/04/1946 (77 y.o. [name] Primary Care Provider: Jerl [name] Other Clinician: Referring Provider: Treating Provider/Extender: [name] in Treatment: 22 History of Present Illness HPI Description: 77 year old male recently seen by his PCP Dr. Mickel [name] who saw him for a wound on the right leg which is less tender and he is continuing to use bacitracin and has completed her course of doxycycline. Past medical history of coronary artery disease, diabetes mellitus type 1, hyperlipidemia, hypertension, MI, plantar fascial fibromatosis, pneumonia, status post foot surgery due to trauma on the left side, coronary angioplasty. he is a former smoker and quit in July 1996. last hemoglobin A1c was 7.8 % in April of this year 5/22 2017 -- biopsy of the right lower extremity was sent for pathology and the report is that of a basal cell carcinoma with edges of the biopsy are involved. I have  called the patient over the phone( 03/28/16) and discussed the pathology report with him and he is agreeable to see a surgeon for excision and need full. I have also spoken personally to Dr. Everlene [name], of [facility] surgical and referred the patient for further wide excision of this lesion and have communicated this to the patient. READMISSION 08/12/18 This is a now 77 year old man with type 1 diabetes. Hemoglobin A1c on 4/29 was 7.9. He is listed as having a history of PAD in the [name] records although the patient doesn't recall this, doesn't complain of any symptoms of claudication and doesn't believe he has had any prior noninvasive arterial test. He is a nonsmoker. He was here in 2017 with a wound on his Right lower leg. This was biopsied in the clinic and pathology showed a basal cell carcinoma. He [name], [name] ([id number]) [id number] Page 2 of 9 went on to have surgery on this area this is currently where I believe his current wounds are located. He generally bumped the lateral part of his right calf 2 weeks ago and has a superficial abrasion. He also has 2 small open areas on the medial part of the tibia in the same area. Our intake nurse noted a stitch coming out of this which was removed. These of the wound sees actually come to the clinic for. However the more concerning area is an area on the tip of the left great toe. He says that this was initially traumatic and he's been to see Dr. Dory [name] podiatrist. [name] been using what I think is Santyl to the wound tip. This has some depth. ABIs in this clinic were non-obtainable on the right and 1.84 on the left. 08/19/18; the patient had arterial studies that did not show evidence of significant hemodynamically compromising occlusions in either leg. There was mild medial atherosclerosis bilaterally. His resting ABIs were within  the normal limits at 1.3 on the right and 1.4 on the left. There was normal posterior and  anterior tibial artery waveforms and velocities listed on both sides. The patient arrives with what appears to be a healthy surface over the tip of his left great toe. This is indeed surprising. Still looks somewhat friable however. More concerning is the x-ray that we did that showed erosions of the tuft of the distal phalanx the left great toe consistent with osteomyelitis. I attempted to pull this x-ray up on colon health Link however I can't open the film to look at this myself. 08/26/18; I reviewed the patient's x-ray and colon helpline. Indeed there is fairly obvious erosion of the tip of his left great toe. The wound was initially a traumatic area hitting it sometime in the middle of night in his kitchen. He is a type I diabetic. I have him on Flagyl and Levaquin that I started last week i.e. 7 days ago. He has an appointment with infectious disease on 09/01/18 09/02/18; the patient was seen by [name] of infectious disease. She did not feel the patient needed IV antibiotics. I do feel he needs further oral antibiotics however. He does not need anymore Flagyl but he does need another 2 weeks of Levaquin. The areas just about closed. 09/16/18; the patient is completing his Levaquin today. There is no open wound on the tip of the toe Readmission: 11-26-2022 upon evaluation today patient presents for initial inspection here in our clinic concerning issues that he has been having after having been hospitalized due to septic arthritis. Subsequently he has a PICC line that is been removed and he has been discharged from infectious disease in that regard. Unfortunately he developed shearing wounds over the gluteal region as well as a pressure ulcer over each heel although they are not looking extremely bad nonetheless they do seem to be evidence of pressure which she sustained while hospitalized. Obviously he was very sick and is still recovering as far as that is concerned. Fortunately there  does not appear to be any evidence of active infection systemically which is good news at this point according to Dr. Judye [name] Patient's wounds also do not appear to be too bad at this point which is good news. Patient does have a history of type 1 diabetes mellitus, peripheral vascular disease, coronary artery disease, congestive heart failure, hypertension, and he is on long-term anticoagulant therapy. 12-03-2022 upon evaluation today patient appears to be doing better to some degree in regard to his ulcers on his heels at this point. We are still waiting on the actual appointment with the vascular surgeons but nonetheless in the meantime I do believe that he is making some good progress currently with regard to loosen up the eschar. I did actually feel like that there was some ability for [place] to remove some of the necrotic tissue today and I discussed that with the patient. He is in agreement with that plan. We also can probably see about making an adjustment in the treatment regimen. 12-10-2022 upon evaluation today patient's wounds were really doing about the same. Again he is still awaiting the evaluation with the vascular surgeon. That should have already been done but had to be pushed back unfortunately. He will be seeing them next week on the seventh. I plan to see him following somewhere around the end of the week I think will probably be best. The patient voiced understanding. 12-19-2022 upon evaluation today patient appears to be  doing well currently in regard to his wound we did get the results of his arterial studies which show that he has excellent ABIs with no signs of vascular compromise. For that reason I will go ahead and perform some debridement today to clearway the necrotic debris with his Eliquis that I will be too aggressive so this will still be a stepwise process but we are to go ahead and get that started. 12-27-2022 upon evaluation today patient appears to be doing well  currently in regard to his wounds. They are actually showing signs of excellent improvement. There is some necrotic tissue noted on the surface of wounds to work on clearing some of that away today also I think that we may switch to [facility] dressing to see how things do going forward. He is in agreement with that plan. Subsequently I am extremely pleased with where we stand currently. No fevers, chills, nausea, vomiting, or diarrhea. 01-02-2023 upon evaluation today patient appears to be doing much better in regard to his wounds the right foot appears to be almost healed the left foot though not completely healed is significantly better. Fortunately there does not appear to be any signs of active infection at this point. 01-10-2023 upon evaluation today patient appears to be doing well currently in regard to his wounds. The right heel actually is completely healed the left heel is definitely headed in the right direction and looks to be doing awesome. I do not see any signs of active infection locally nor systemically at this time which is great news. 3/7; his right heel has remained healed. There is no evidence of active infection. The remaining wound is on the lateral aspect of the heel just above the plantar surface 01-30-2023 upon evaluation today patient appears to be doing well currently in regard to his wound. The heel actually showing signs of improvement which is great news and fortunately I do not see any evidence of active infection locally nor systemically at this time. Overall I think that we are headed in the right direction which is excellent here. No fevers, chills, nausea, vomiting, or diarrhea. I am good have to perform some sharp debridement today. This is just the left heel that is remaining open. 02-06-2023 upon evaluation today patient appears to be doing well currently in regard to his wound. Has been tolerating the dressing changes without complication. Fortunately there  does not appear to be any signs of active infection locally nor systemically although he does have some need for sharp debridement with some definite necrotic tissue at the base of the wound today. 02-13-2023 upon evaluation today patient appears to be doing well currently in regard to his wound which I feel like is slowly clearing up. I did review his growth labs as well the sed rate and C-reactive protein are down his white blood cell count was normal his hemoglobin is coming up. He did see Dr. Joylene Draft on Tuesday and she did continue him on the doxycycline considering his lab numbers but nonetheless he seems to be making some pretty good progress here in my opinion. The wound is slowly turnaround serial debridements are obviously helping with that being said I do believe that he is going to still require dressing changes and close monitoring. 02-20-2023 upon evaluation today patient's wound actually appears to be making some slight progress. This is slowly but surely moving into the right direction. Fortunately I do not see any signs of active infection at this time. 02-27-2023 upon evaluation  today patient appears to be doing well currently in regard to his wound. He has been tolerating the dressing changes. I think the Hydrofera Blue is doing a good job and he seems to be doing well with that currently. 03-06-2023 upon evaluation today patient appears to be doing well currently in regard to his wounds were actually seeing signs of improvement which is great news. Fortunately I do not see any evidence of active infection locally nor systemically which is great news and overall I do believe that we are moving in the right direction. 03-13-2023 upon evaluation today patient's wound is actually showing signs of improvement. We actually do have a snap VAC with the bridge today and I am hopeful that this will actually be beneficial for him. Will give this a shot and see how things do. 03-17-2023 upon  evaluation today patient comes in early due to having issues with the snap VAC will need to not maintain a seal. Subsequently we did end up [name], [name] ([id number]) [id number] Page 3 of 9 seeing an and removing the snap VAC due to the fact that there is a lot of maceration and drainage around the wound itself. Upon removal however the wound appears to be doing excellent and in fact was in much better shape that even when I saw her last Thursday. 03-20-2023 upon evaluation today patient appears to be doing well currently in regard to his wound is showing more signs of granulation which is great news and overall I am extremely pleased with where we stand today. I do not see any evidence of active infection locally nor systemically which is great news. No fevers, chills, nausea, vomiting, or diarrhea. 03-28-2023 upon evaluation today patient appears to be doing well currently in regard to his wound although he unfortunately is again macerated. I think a big portion of this is his leg swelling they were unable to really use the juxta lite appropriately over top of the snap VAC. For that reason his leg is extremely swollen as he has not really had any compression on over the past week. This obviously is not good and I discussed that with the patient today as well. I would recommend we take a break for a week yet again and I think that at this point we may need to actually compression wrap him. If organ to continue with the snap VAC I think he needs to be underneath the compression wrap. 04-03-2023 upon evaluation today patient actually appears to be making good progress in my opinion in regard to his leg wound more specifically on the heel. He has been tolerating the dressing changes without complication and I actually feel like with the compression wrapping not only is his leg smaller but this looks much better compared to last week's evaluation. Fortunately I do not see  any evidence of active infection locally nor systemically which is great news. 04-10-2023 upon evaluation today patient appears to be doing well currently in regard to his wound. He is actually tolerating the dressing changes without complication and very pleased in that regard. Fortunately I do not see any signs of active infection at this time. I do think organ to try to go back to the snap VAC today with the compression wrap. 04-15-2023 upon evaluation today patient appears to be doing well currently in regard to his wound. The snap VAC seems to be doing an excellent job. I am actually extremely pleased with where we stand. 04-22-2023 upon evaluation today patient appears  to be doing well currently in regard to his wound which is actually showing signs of improvement. Fortunately I do not see any evidence of active infection locally nor systemically which is great news. No fevers, chills, nausea, vomiting, or diarrhea. 04-29-2023 upon evaluation today patient appears to be doing well currently in regard to his heel is showing signs of excellent improvement and actually very pleased with how things are doing with the snap VAC. Electronic Signature(s) Signed: 04/29/2023 1:42:57 PM By: [name] [name] Entered By: [name] on 04/29/2023 13:42:57 -------------------------------------------------------------------------------- Physical Exam Details Patient Name: Date of Service: [name] [name] 04/29/2023 1:00 PM Medical Record Number: [id number] Patient Account Number: [id number] Date of Birth/Sex: Treating RN: [birth year] (77 y.o. [name] Primary Care Provider: Jerl [name] Other Clinician: Referring Provider: Treating Provider/Extender: [name] in Treatment: 22 Constitutional Well-nourished and well-hydrated in no acute distress. Respiratory normal breathing without difficulty. Psychiatric this patient is able to make decisions and demonstrates  good insight into disease process. Alert and Oriented x 3. pleasant and cooperative. Notes Upon inspection patient's wound is actually so healthy does not require any sharp debridement I really feel like snap VAC is doing a great job. In general I think that we are moving in the right direction and I think that he is really doing well with compression wrapping along with the snap. Electronic Signature(s) Signed: 04/29/2023 1:43:20 PM By: [name] [name] Entered By: [name] on 04/29/2023 13:43:20 Cannan, [name] ([id number]) [id number] Page 4 of 9 -------------------------------------------------------------------------------- Physician Orders Details Patient Name: Date of Service: [name] [name] 04/29/2023 1:00 PM Medical Record Number: [id number] Patient Account Number: [id number] Date of Birth/Sex: Treating RN: 1945/11/25 (77 y.o. 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 - Tuesday to check snap vac and re wrap Return A Nurse Visit as needed Bathing/ Shower/ Hygiene May shower with wound dressing protected with water repellent cover or cast protector. No tub bath. - no soaking foot in tub Anesthetic (Use 'Patient  Medications' Section for Anesthetic Order Entry) Lidocaine applied to wound bed Edema Control - Lymphedema / Segmental Compressive Device / Other Patient to wear own compression stockings. Remove compression stockings every night before going to bed and put on every morning when getting up. - pt has juxtafit velcro wrap-ON HOLD AT THIS TIME Elevate, Exercise Daily and A void Standing for Long Periods of Time. Elevate legs to the level of the heart and pump ankles as often as possible Elevate leg(s) parallel to the floor when sitting. DO YOUR BEST to sleep in the bed at night. DO NOT sleep in your recliner. Long hours of sitting in a recliner leads to swelling of the legs and/or potential wounds on your backside. Negative Pressure Wound Therapy Snap Vac applied Wound VAC settings at continuous pressure. Use foam to wound cavity. Please order WHITE foam to fill any tunnel/s and/or undermining when  necessary. Change VAC dressing 2 X WEEK. Change canister as indicated when full. Number of foam/gauze pieces used in the dressing = - 2, one wedge piece blue foam in wound bed, one foam piece in bridge. Wound Treatment Wound #5 - Calcaneus Wound Laterality: Left Cleanser: Byram Ancillary Kit - 15 Day Supply (Generic) 1 x Per Week/30 Days Discharge Instructions: Use supplies as instructed; Kit contains: (15) Saline Bullets; (15) 3x3 Gauze; 15 pr Gloves Cleanser: Vashe 5.8 (oz) 1 x Per Week/30 Days Discharge Instructions: Use vashe 5.8 (oz) as directed Compression Wrap: Urgo K2 Lite, two layer compression system, large 1 x Per Week/30 Days Electronic Signature(s) Signed: 04/29/2023 3:28:11 PM By: Angelina Pih Signed: 05/01/2023 5:52:14 PM By: Dionne Ano [name], [name] ([id number]) [name] [name] [phone].pdf Page 5 of 9 Signed: 05/01/2023 5:52:14 PM By: Margaret Pyle By: Angelina Pih on 04/29/2023  14:06:11 -------------------------------------------------------------------------------- Problem List Details Patient Name: Date of Service: [name] [name] 04/29/2023 1:00 PM Medical Record Number: [id number] Patient Account Number: [id number] Date of Birth/Sex: Treating RN: [birth year] (77 y.o. [name] Primary Care Provider: Jerl [name] Other Clinician: Referring Provider: Treating Provider/Extender: [name] in Treatment: 22 Active Problems ICD-10 Encounter Code Description Active Date MDM Diagnosis E10.621 Type 1 diabetes mellitus with foot ulcer 11/26/2022 No Yes I73.89 Other specified peripheral vascular diseases 11/26/2022 No Yes L89.623 Pressure ulcer of left heel, stage 3 11/26/2022 No Yes L89.613 Pressure ulcer of right heel, stage 3 11/26/2022 No Yes L98.412 Non-pressure chronic ulcer of buttock with fat layer exposed 11/26/2022 No Yes I25.10 Atherosclerotic heart disease of native coronary artery without angina pectoris 11/26/2022 No Yes I50.42 Chronic combined systolic (congestive) and diastolic (congestive) heart failure 11/26/2022 No Yes I10 Essential (primary) hypertension 11/26/2022 No Yes Inactive Problems Resolved Problems Electronic Signature(s) Signed: 04/29/2023 1:38:35 PM By: [name] [name] Entered By: [name] on 04/29/2023 13:38:35 Delfavero, [name] ([id number]) [id number] Page 6 of 9 -------------------------------------------------------------------------------- Progress Note Details Patient Name: Date of Service: [name] [name] 04/29/2023 1:00 PM Medical Record Number: [id number] Patient Account Number: [id number] Date of Birth/Sex: Treating RN: 12/10/1945 (77 y.o. [name] Primary Care Provider: Jerl [name] Other Clinician: Referring Provider: Treating Provider/Extender: [name] in Treatment: 22 Subjective Chief  Complaint Information obtained from Patient Left heel pressure ulcer History of Present Illness (HPI) 77 year old male recently seen by his PCP Dr. Mickel [name] who saw him for a wound on the right leg which is less tender and he is continuing to use bacitracin and has completed her course of doxycycline. Past medical history of coronary artery disease, diabetes mellitus type 1, hyperlipidemia, hypertension, MI, plantar fascial fibromatosis, pneumonia, status post foot surgery due to trauma on the left side, coronary angioplasty. he is a former smoker and quit in July 1996. last hemoglobin A1c was 7.8 % in April of this year 5/22 2017 -- biopsy of the right lower extremity was sent for pathology and the report is that of a basal cell carcinoma with edges of the biopsy are involved. I have called the patient over the phone( 03/28/16) and discussed the pathology report with him and he is agreeable to see a surgeon for excision and need full. I have also spoken personally to Dr. Everlene [name], of [facility] surgical and referred the patient for further wide excision of this lesion and have communicated this to the patient. READMISSION 08/12/18 This is a now 77 year old man with type 1 diabetes. Hemoglobin A1c on  4/29 was 7.9. He is listed as having a history of PAD in the [place] records although the patient doesn't recall this, doesn't complain of any symptoms of claudication and doesn't believe he has had any prior noninvasive arterial test. He is a nonsmoker. He was here in 2017 with a wound on his Right lower leg. This was biopsied in the clinic and pathology showed a basal cell carcinoma. He went on to have surgery on this area this is currently where I believe his current wounds are located. He generally bumped the lateral part of his right calf 2 weeks ago and has a superficial abrasion. He also has 2 small open areas on the medial part of the tibia in the same area. Our intake nurse noted a  stitch coming out of this which was removed. These of the wound sees actually come to the clinic for. However the more concerning area is an area on the tip of the left great toe. He says that this was initially traumatic and he's been to see Dr. Dory [name] podiatrist. [name] been using what I think is Santyl to the wound tip. This has some depth. ABIs in this clinic were non-obtainable on the right and 1.84 on the left. 08/19/18; the patient had arterial studies that did not show evidence of significant hemodynamically compromising occlusions in either leg. There was mild medial atherosclerosis bilaterally. His resting ABIs were within the normal limits at 1.3 on the right and 1.4 on the left. There was normal posterior and anterior tibial artery waveforms and velocities listed on both sides. The patient arrives with what appears to be a healthy surface over the tip of his left great toe. This is indeed surprising. Still looks somewhat friable however. More concerning is the x-ray that we did that showed erosions of the tuft of the distal phalanx the left great toe consistent with osteomyelitis. I attempted to pull this x-ray up on colon health Link however I can't open the film to look at this myself. 08/26/18; I reviewed the patient's x-ray and colon helpline. Indeed there is fairly obvious erosion of the tip of his left great toe. The wound was initially a traumatic area hitting it sometime in the middle of night in his kitchen. He is a type I diabetic. I have him on Flagyl and Levaquin that I started last week i.e. 7 days ago. He has an appointment with infectious disease on 09/01/18 09/02/18; the patient was seen by [name] of infectious disease. She did not feel the patient needed IV antibiotics. I do feel he needs further oral antibiotics however. He does not need anymore Flagyl but he does need another 2 weeks of Levaquin. The areas just about closed. 09/16/18; the patient is completing  his Levaquin today. There is no open wound on the tip of the toe Readmission: 11-26-2022 upon evaluation today patient presents for initial inspection here in our clinic concerning issues that he has been having after having been hospitalized due to septic arthritis. Subsequently he has a PICC line that is been removed and he has been discharged from infectious disease in that regard. Unfortunately he developed shearing wounds over the gluteal region as well as a pressure ulcer over each heel although they are not looking extremely bad nonetheless they do seem to be evidence of pressure which she sustained while hospitalized. Obviously he was very sick and is still recovering as far as that is concerned. Fortunately there does not appear to be any evidence  of active infection systemically which is good news at this point according to Dr. Judye [name] Patient's wounds also do not appear to be too bad at this point which is good news. Patient does have a history of type 1 diabetes mellitus, peripheral vascular disease, coronary artery disease, congestive heart failure, hypertension, and he is on long-term anticoagulant therapy. 12-03-2022 upon evaluation today patient appears to be doing better to some degree in regard to his ulcers on his heels at this point. We are still waiting on the actual appointment with the vascular surgeons but nonetheless in the meantime I do believe that he is making some good progress currently with regard to loosen up the eschar. I did actually feel like that there was some ability for [place] to remove some of the necrotic tissue today and I discussed that with the patient. He is in agreement with that plan. We also can probably see about making an adjustment in the treatment regimen. 12-10-2022 upon evaluation today patient's wounds were really doing about the same. Again he is still awaiting the evaluation with the vascular surgeon. That NYGEL, [name] ([id number])  [id number] Page 7 of 9 should have already been done but had to be pushed back unfortunately. He will be seeing them next week on the seventh. I plan to see him following somewhere around the end of the week I think will probably be best. The patient voiced understanding. 12-19-2022 upon evaluation today patient appears to be doing well currently in regard to his wound we did get the results of his arterial studies which show that he has excellent ABIs with no signs of vascular compromise. For that reason I will go ahead and perform some debridement today to clearway the necrotic debris with his Eliquis that I will be too aggressive so this will still be a stepwise process but we are to go ahead and get that started. 12-27-2022 upon evaluation today patient appears to be doing well currently in regard to his wounds. They are actually showing signs of excellent improvement. There is some necrotic tissue noted on the surface of wounds to work on clearing some of that away today also I think that we may switch to [facility] dressing to see how things do going forward. He is in agreement with that plan. Subsequently I am extremely pleased with where we stand currently. No fevers, chills, nausea, vomiting, or diarrhea. 01-02-2023 upon evaluation today patient appears to be doing much better in regard to his wounds the right foot appears to be almost healed the left foot though not completely healed is significantly better. Fortunately there does not appear to be any signs of active infection at this point. 01-10-2023 upon evaluation today patient appears to be doing well currently in regard to his wounds. The right heel actually is completely healed the left heel is definitely headed in the right direction and looks to be doing awesome. I do not see any signs of active infection locally nor systemically at this time which is great news. 3/7; his right heel has remained healed.  There is no evidence of active infection. The remaining wound is on the lateral aspect of the heel just above the plantar surface 01-30-2023 upon evaluation today patient appears to be doing well currently in regard to his wound. The heel actually showing signs of improvement which is great news and fortunately I do not see any evidence of active infection locally nor systemically at this time. Overall I think that we are  headed in the right direction which is excellent here. No fevers, chills, nausea, vomiting, or diarrhea. I am good have to perform some sharp debridement today. This is just the left heel that is remaining open. 02-06-2023 upon evaluation today patient appears to be doing well currently in regard to his wound. Has been tolerating the dressing changes without complication. Fortunately there does not appear to be any signs of active infection locally nor systemically although he does have some need for sharp debridement with some definite necrotic tissue at the base of the wound today. 02-13-2023 upon evaluation today patient appears to be doing well currently in regard to his wound which I feel like is slowly clearing up. I did review his growth labs as well the sed rate and C-reactive protein are down his white blood cell count was normal his hemoglobin is coming up. He did see Dr. Joylene Draft on Tuesday and she did continue him on the doxycycline considering his lab numbers but nonetheless he seems to be making some pretty good progress here in my opinion. The wound is slowly turnaround serial debridements are obviously helping with that being said I do believe that he is going to still require dressing changes and close monitoring. 02-20-2023 upon evaluation today patient's wound actually appears to be making some slight progress. This is slowly but surely moving into the right direction. Fortunately I do not see any signs of active infection at this time. 02-27-2023 upon evaluation  today patient appears to be doing well currently in regard to his wound. He has been tolerating the dressing changes. I think the Hydrofera Blue is doing a good job and he seems to be doing well with that currently. 03-06-2023 upon evaluation today patient appears to be doing well currently in regard to his wounds were actually seeing signs of improvement which is great news. Fortunately I do not see any evidence of active infection locally nor systemically which is great news and overall I do believe that we are moving in the right direction. 03-13-2023 upon evaluation today patient's wound is actually showing signs of improvement. We actually do have a snap VAC with the bridge today and I am hopeful that this will actually be beneficial for him. Will give this a shot and see how things do. 03-17-2023 upon evaluation today patient comes in early due to having issues with the snap VAC will need to not maintain a seal. Subsequently we did end up seeing an and removing the snap VAC due to the fact that there is a lot of maceration and drainage around the wound itself. Upon removal however the wound appears to be doing excellent and in fact was in much better shape that even when I saw her last Thursday. 03-20-2023 upon evaluation today patient appears to be doing well currently in regard to his wound is showing more signs of granulation which is great news and overall I am extremely pleased with where we stand today. I do not see any evidence of active infection locally nor systemically which is great news. No fevers, chills, nausea, vomiting, or diarrhea. 03-28-2023 upon evaluation today patient appears to be doing well currently in regard to his wound although he unfortunately is again macerated. I think a big portion of this is his leg swelling they were unable to really use the juxta lite appropriately over top of the snap VAC. For that reason his leg is extremely swollen as he has not really had any  compression on over the  past week. This obviously is not good and I discussed that with the patient today as well. I would recommend we take a break for a week yet again and I think that at this point we may need to actually compression wrap him. If organ to continue with the snap VAC I think he needs to be underneath the compression wrap. 04-03-2023 upon evaluation today patient actually appears to be making good progress in my opinion in regard to his leg wound more specifically on the heel. He has been tolerating the dressing changes without complication and I actually feel like with the compression wrapping not only is his leg smaller but this looks much better compared to last week's evaluation. Fortunately I do not see any evidence of active infection locally nor systemically which is great news. 04-10-2023 upon evaluation today patient appears to be doing well currently in regard to his wound. He is actually tolerating the dressing changes without complication and very pleased in that regard. Fortunately I do not see any signs of active infection at this time. I do think organ to try to go back to the snap VAC today with the compression wrap. 04-15-2023 upon evaluation today patient appears to be doing well currently in regard to his wound. The snap VAC seems to be doing an excellent job. I am actually extremely pleased with where we stand. 04-22-2023 upon evaluation today patient appears to be doing well currently in regard to his wound which is actually showing signs of improvement. Fortunately I do not see any evidence of active infection locally nor systemically which is great news. No fevers, chills, nausea, vomiting, or diarrhea. 04-29-2023 upon evaluation today patient appears to be doing well currently in regard to his heel is showing signs of excellent improvement and actually very pleased with how things are doing with the snap VAC. Objective [name], [name] ([id number])  [id number] Page 8 of 9 Constitutional Well-nourished and well-hydrated in no acute distress. Vitals Time Taken: 1:14 PM, Height: 69 in, Weight: 190 lbs, BMI: 28.1, Temperature: 97.5 F, Pulse: 97 bpm, Respiratory Rate: 18 breaths/min, Blood Pressure: 107/68 mmHg. Respiratory normal breathing without difficulty. Psychiatric this patient is able to make decisions and demonstrates good insight into disease process. Alert and Oriented x 3. pleasant and cooperative. General Notes: Upon inspection patient's wound is actually so healthy does not require any sharp debridement I really feel like snap VAC is doing a great job. In general I think that we are moving in the right direction and I think that he is really doing well with compression wrapping along with the snap. Integumentary (Hair, Skin) Wound #5 status is Open. Original cause of wound was Pressure Injury. The date acquired was: 10/20/2022. The wound has been in treatment 22 weeks. The wound is located on the Left Calcaneus. The wound measures 1cm length x 1cm width x 1.1cm depth; 0.785cm^2 area and 0.864cm^3 volume. There is Fat Layer (Subcutaneous Tissue) exposed. There is no tunneling or undermining noted. There is a medium amount of serosanguineous drainage noted. There is large (67-100%) red granulation within the wound bed. There is a small (1-33%) amount of necrotic tissue within the wound bed including Adherent Slough. 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 I am going to continue with the  snap VAC along with the compression wrap. I think this has really done excellent for him. 2. I am also going to recommend that the patient should continue to monitor for any evidence of infection or worsening. In general I think that we are moving in the right direction is just a matter of continuing with the plan as far as healing is concerned. He is doing very well with both a compression wrap and the snap VAC for that reason we will going to continue with what we have been doing previous. We will see patient back for reevaluation in 1 week here in the clinic. If anything worsens or changes patient will contact our office for additional recommendations. Electronic Signature(s) Signed: 04/29/2023 1:44:00 PM By: [name] [name] Entered By: [name] on 04/29/2023 13:44:00 -------------------------------------------------------------------------------- SuperBill Details Patient Name: Date of Service: [name] [name] 04/29/2023 Medical Record Number: [id number] Patient Account Number: [id number] Date of Birth/Sex: Treating RN: 1946/04/23 (77 y.o. [name] Primary Care Provider: Jerl [name] Other Clinician: Referring Provider: Treating Provider/Extender: [name] in Treatment: [address], [place] [name] ([id number]) [id number] Page 9 of 9 Diagnosis Coding ICD-10 Codes Code Description E10.621 Type 1 diabetes mellitus with foot ulcer I73.89 Other specified peripheral vascular diseases L89.623 Pressure ulcer of left heel, stage 3 L89.613 Pressure ulcer of right heel, stage 3 L98.412 Non-pressure chronic ulcer of buttock with fat layer exposed I25.10 Atherosclerotic heart disease of native coronary artery without angina pectoris I50.42 Chronic combined systolic (congestive) and diastolic (congestive) heart failure I10 Essential (primary) hypertension Physician Procedures : CPT4 Code Description Modifier [id number] -  WC PHYS LEVEL 4 - EST PT ICD-10 Diagnosis Description E10.621 Type 1 diabetes mellitus with foot ulcer I73.89 Other specified peripheral vascular diseases L89.623 Pressure ulcer of left heel, stage 3  L89.613 Pressure ulcer of right heel, stage 3 Quantity: 1 Electronic Signature(s) Signed: 04/29/2023 1:44:15 PM By: [name] [name] Entered By: [name] on 04/29/2023 13:44:15

## 2023-04-29 NOTE — Progress Notes (Addendum)
[name], TSUTSUI ([id number]) [id number] Page 1 of 9 Visit Report for 04/29/2023 Arrival Information Details Patient Name: Date of Service: [name], [name] 04/29/2023 1:00 PM Medical Record Number: [id number] Patient Account Number: [id number] Date of Birth/Sex: Treating RN: Feb 21, 1946 (77 y.o. [name] Primary Care Kambra Beachem: [name] Other Clinician: Referring Diera Wirkkala: Treating Savvas Roper/Extender: [name] in Treatment: 22 Visit Information History Since Last Visit Added or deleted any medications: No Patient Arrived: [name] Any new allergies or adverse reactions: No Arrival Time: 13:13 Had a fall or experienced change in No Accompanied By: SPOUSE activities of daily living that may affect Transfer Assistance: None risk of falls: Patient Identification Verified: Yes Hospitalized since last visit: No Secondary Verification Process Completed: Yes Has Dressing in Place as Prescribed: Yes Patient Requires Transmission-Based Precautions: No Has Compression in Place as Prescribed: Yes Patient Has Alerts: Yes Pain Present Now: No Patient Alerts: Type I Diabetic eliquis ABI/TBI normal see scan Electronic Signature(s) Signed: 04/29/2023 3:28:11 PM By: Angelina Pih Entered By: Angelina Pih on 04/29/2023 13:14:18 -------------------------------------------------------------------------------- Clinic Level of Care Assessment Details Patient Name: Date of Service: [name], [name] 04/29/2023 1:00 PM Medical Record Number: [id number] Patient Account Number: [id number] Date of Birth/Sex: Treating RN: [birth year] (77 y.o. [name] Primary Care Cainan Trull: [name] Other Clinician: Referring Berneice [name]: Treating [name]/Extender: [name] in Treatment: 22 Clinic Level of Care Assessment Items TOOL 1 Quantity Score []  - 0 Use when EandM and Procedure is performed  on INITIAL visit ASSESSMENTS - Nursing Assessment / Reassessment []  - 0 General Physical Exam (combine w/ comprehensive assessment (listed just below) when performed on new pt. evals) []  - 0 Comprehensive Assessment (HX, ROS, Risk Assessments, Wounds Hx, etc.) ASSESSMENTS - Wound and Skin Assessment / Reassessment []  - 0 Dermatologic / Skin Assessment (not related to wound area) Starner, Janis H ([id number]) [id number].pdf Page 2 of 9 ASSESSMENTS - Ostomy and/or Continence Assessment and Care []  - 0 Incontinence Assessment and Management []  - 0 Ostomy Care Assessment and Management (repouching, etc.) PROCESS - Coordination of Care []  - 0 Simple Patient / Family Education for ongoing care []  - 0 Complex (extensive) Patient / Family Education for ongoing care []  - 0 Staff obtains Chiropractor, Records, T Results / Process Orders est []  - 0 Staff telephones HHA, Nursing Homes / Clarify orders / etc []  - 0 Routine Transfer to another Facility (non-emergent condition) []  - 0 Routine Hospital Admission (non-emergent condition) []  - 0 New Admissions / Manufacturing engineer / Ordering NPWT Apligraf, etc. , []  - 0 Emergency Hospital Admission (emergent condition) PROCESS - Special Needs []  - 0 Pediatric / Minor Patient Management []  - 0 Isolation Patient Management []  - 0 Hearing / Language / Visual special needs []  - 0 Assessment of Community assistance (transportation, D/C planning, etc.) []  - 0 Additional assistance / Altered mentation []  - 0 Support Surface(s) Assessment (bed, cushion, seat, etc.) INTERVENTIONS - Miscellaneous []  - 0 External ear exam []  - 0 Patient Transfer (multiple staff / Nurse, adult / Similar devices) []  - 0 Simple Staple / Suture removal (25 or less) []  - 0 Complex Staple / Suture removal (26 or more) []  - 0 Hypo/Hyperglycemic Management (do not check if billed separately) []  - 0 Ankle / Brachial Index (ABI) - do  not check if billed separately Has the patient been seen at the hospital within the last three years: Yes Total Score: 0 Level Of Care: ____ Electronic  Signature(s) Signed: 05/05/2023 5:25:46 PM By: Angelina Pih Entered By: Angelina Pih on 05/05/2023 13:40:08 -------------------------------------------------------------------------------- Encounter Discharge Information Details Patient Name: Date of Service: [name], [name] 04/29/2023 1:00 PM Medical Record Number: [id number] Patient Account Number: [id number] Date of Birth/Sex: Treating RN: 10-May-1946 (77 y.o. [name] Primary Care Kipton Skillen: [name] Other Clinician: Referring [name]: Treating Bula Cavalieri/Extender: [name] in Treatment: 22 Encounter Discharge Information Items [name], [name] ([id number]) [id number] Page 3 of 9 Discharge Condition: Stable Ambulatory Status: Cane Discharge Destination: Home Transportation: Private Auto Accompanied By: spouse Schedule Follow-up Appointment: Yes Clinical Summary of Care: Electronic Signature(s) Signed: 05/05/2023 1:45:48 PM By: Angelina Pih Entered By: Angelina Pih on 05/05/2023 13:45:48 -------------------------------------------------------------------------------- Lower Extremity Assessment Details Patient Name: Date of Service: [name], [name] 04/29/2023 1:00 PM Medical Record Number: [id number] Patient Account Number: [id number] Date of Birth/Sex: Treating RN: September 25, 1946 (77 y.o. [name] Primary Care [name]: [name] Other Clinician: Referring [name]: Treating Bufford [name]/Extender: [name] in Treatment: 22 Edema Assessment Assessed: [Left: No] [Right: No] Edema: [Left: Ye] [Right: s] Calf Left: Right: Point of Measurement: 34 cm From Medial Instep 33 cm Ankle Left: Right: Point of Measurement: 11 cm From Medial Instep 26.2  cm Vascular Assessment Pulses: Dorsalis Pedis Palpable: [Left:Yes] Electronic Signature(s) Signed: 04/29/2023 3:28:11 PM By: Angelina Pih Entered By: Angelina Pih on 04/29/2023 13:29:58 Hoelzel, [name] ([id number]) [id number] Page 4 of 9 -------------------------------------------------------------------------------- Multi Wound Chart Details Patient Name: Date of Service: [name], [name] 04/29/2023 1:00 PM Medical Record Number: [id number] Patient Account Number: [id number] Date of Birth/Sex: Treating RN: 08/07/1946 (77 y.o. [name] Primary Care Paulett Kaufhold: [name] Other Clinician: Referring Wyeth Hoffer: Treating Travers Goodley/Extender: [name] in Treatment: 22 Vital Signs Height(in): 69 Pulse(bpm): 97 Weight(lbs): 190 Blood Pressure(mmHg): 107/68 Body Mass Index(BMI): 28.1 Temperature(F): 97.5 Respiratory Rate(breaths/min): 18 [5:Photos:] [N/A:N/A] Left Calcaneus N/A N/A Wound Location: Pressure Injury N/A N/A Wounding Event: Pressure Ulcer N/A N/A Primary Etiology: Coronary Artery Disease, N/A N/A Comorbid History: Hypertension, Type I Diabetes, Osteoarthritis, Received Chemotherapy 10/20/2022 N/A N/A Date Acquired: 22 N/A N/A Weeks of Treatment: Open N/A N/A Wound Status: No N/A N/A Wound Recurrence: 1x1x1.1 N/A N/A Measurements L x W x D (cm) 0.785 N/A N/A A (cm) : rea 0.864 N/A N/A Volume (cm) : 83.30% N/A N/A % Reduction in A rea: -83.40% N/A N/A % Reduction in Volume: Category/Stage III N/A N/A Classification: Medium N/A N/A Exudate A mount: Serosanguineous N/A N/A Exudate Type: red, brown N/A N/A Exudate Color: Large (67-100%) N/A N/A Granulation A mount: Red N/A N/A Granulation Quality: Small (1-33%) N/A N/A Necrotic A mount: Fat Layer (Subcutaneous Tissue): Yes N/A N/A Exposed Structures: None N/A N/A Epithelialization: Treatment Notes Electronic  Signature(s) Signed: 04/29/2023 3:28:11 PM By: Angelina Pih Entered By: Angelina Pih on 04/29/2023 13:39:58 -------------------------------------------------------------------------------- Multi-Disciplinary Care Plan Details Patient Name: Date of Service: [name], [name] 04/29/2023 1:00 PM Olesky, [name] ([id number]) [id number] Page 5 of 9 Medical Record Number: [id number] Patient Account Number: [id number] Date of Birth/Sex: Treating RN: 02/10/1946 (77 y.o. [name] Primary Care Josalynn Johndrow: [name] Other Clinician: Referring Achilles Neville: Treating Siddharth Babington/Extender: [name] in Treatment: 22 Active Inactive Pressure Nursing Diagnoses: Potential for impaired tissue integrity related to pressure, friction, moisture, and shear Goals: Patient will remain free of pressure ulcers Date Initiated: 11/26/2022 Target Resolution Date: 05/12/2023 Goal Status: Active Interventions: Assess: immobility, friction, shearing, incontinence upon admission  and as needed Assess offloading mechanisms upon admission and as needed Assess potential for pressure ulcer upon admission and as needed Notes: Electronic Signature(s) Signed: 05/05/2023 1:44:27 PM By: Angelina Pih Entered By: Angelina Pih on 05/05/2023 13:44:27 -------------------------------------------------------------------------------- Negative Pressure Wound Therapy Maintenance (NPWT) Details Patient Name: Date of Service: [name], [name] 04/29/2023 1:00 PM Medical Record Number: [id number] Patient Account Number: [id number] Date of Birth/Sex: Treating RN: [birth year] (77 y.o. [name] Primary Care Terrell Ostrand: [name] Other Clinician: Referring Towanna Avery: Treating Cecely Rengel/Extender: [name] in Treatment: 22 NPWT Maintenance Performed for: Wound #5 Left Calcaneus Performed By: Angelina Pih, RN Type:  Other Coverage Size (sq cm): 1 Pressure Type: Constant Pressure Setting: 125 mmHG Drain Type: None Primary Contact: Other : wedge piece blue foam Sponge/Dressing Type: Foam- Blue Date Initiated: 03/13/2023 Dressing Removed: Yes Quantity of Sponges/Gauze Removed: 1 Canister Changed: Yes Canister Exudate Volume: 0 Dressing Reapplied: Yes Quantity of Sponges/Gauze Inserted: 2 Respones T Treatment: o tolerated Days On NPWT : 48 Post Procedure Diagnosis Same as Pre-procedure CHRISTOBAL, MORADO ([id number]) [phone].pdf Page 6 of 9 Electronic Signature(s) Signed: 04/29/2023 3:28:11 PM By: Angelina Pih Entered By: Angelina Pih on 04/29/2023 14:07:22 -------------------------------------------------------------------------------- Pain Assessment Details Patient Name: Date of Service: [name], [name] 04/29/2023 1:00 PM Medical Record Number: [id number] Patient Account Number: [id number] Date of Birth/Sex: Treating RN: [birth year] (77 y.o. [name] Primary Care Cattleya Dobratz: [name] Other Clinician: Referring Alfonse Garringer: Treating Jhostin Epps/Extender: [name] in Treatment: 22 Active Problems Location of Pain Severity and Description of Pain Patient Has Paino No Site Locations Rate the pain. Current Pain Level: 0 Pain Management and Medication Current Pain Management: Electronic Signature(s) Signed: 04/29/2023 3:28:11 PM By: Angelina Pih Entered By: Angelina Pih on 04/29/2023 13:14:39 -------------------------------------------------------------------------------- Patient/Caregiver Education Details Patient Name: Date of Service: [name], [name] 6/18/2024andnbsp1:00 PM Chai, [name] ([id number]) [id number] Page 7 of 9 Medical Record Number: [id number] Patient Account Number: [id number] Date of Birth/Gender: Treating RN: [birth year] (77 y.o. [name] Primary Care Physician: [name] Other Clinician: Referring Physician: Treating Physician/Extender: [name] in Treatment: 22 Education Assessment Education Provided To: Patient Education Topics Provided Wound/Skin Impairment: Handouts: Caring for Your Ulcer Methods: Explain/Verbal Responses: State content correctly Electronic Signature(s) Signed: 05/05/2023 5:25:46 PM By: Angelina Pih Entered By: Angelina Pih on 05/05/2023 13:44:41 -------------------------------------------------------------------------------- Wound Assessment Details Patient Name: Date of Service: [name], [name] 04/29/2023 1:00 PM Medical Record Number: [id number] Patient Account Number: [id number] Date of Birth/Sex: Treating RN: 29-Dec-1945 (77 y.o. [name] Primary Care Alva Kuenzel: [name] Other Clinician: Referring Buck Mcaffee: Treating Avamae Dehaan/Extender: [name] in Treatment: 22 Wound Status Wound Number: 5 Primary Pressure Ulcer Etiology: Wound Location: Left Calcaneus Wound Open Wounding Event: Pressure Injury Status: Date Acquired: 10/20/2022 Comorbid Coronary Artery Disease, Hypertension, Type I Diabetes, Weeks Of Treatment: 22 History: Osteoarthritis, Received Chemotherapy Clustered Wound: No Photos Wound Measurements Length: (cm) 1 Width: (cm) 1 Depth: (cm) 1.1 Area: (cm) 0.7 Volume: (cm) 0.8 Boule, Leopold H ([id number]) % Reduction in Area: 83.3% % Reduction in Volume: -83.4% Epithelialization: None 85 Tunneling: No 64 Undermining: No [id number] Page 8 of 9 Wound Description Classification: Category/Stage III Exudate Amount: Medium Exudate Type: Serosanguineous Exudate Color: red, brown Foul Odor After Cleansing: No Slough/Fibrino Yes Wound Bed Granulation Amount: Large (67-100%) Exposed Structure Granulation Quality: Red Fat Layer (Subcutaneous Tissue)  Exposed: Yes Necrotic Amount: Small (1-33%) Necrotic Quality: Adherent Slough  Treatment Notes Wound #5 (Calcaneus) Wound Laterality: Left Cleanser Byram Ancillary Kit - 15 Day Supply Discharge Instruction: Use supplies as instructed; Kit contains: (15) Saline Bullets; (15) 3x3 Gauze; 15 pr Gloves Vashe 5.8 (oz) Discharge Instruction: Use vashe 5.8 (oz) as directed Peri-Wound Care Topical Primary Dressing Secondary Dressing Secured With Compression Wrap Urgo K2 Lite, two layer compression system, large Compression Stockings Add-Ons Electronic Signature(s) Signed: 04/29/2023 3:28:11 PM By: Angelina Pih Entered By: Angelina Pih on 04/29/2023 13:28:58 -------------------------------------------------------------------------------- Vitals Details Patient Name: Date of Service: [name], [name] 04/29/2023 1:00 PM Medical Record Number: [id number] Patient Account Number: [id number] Date of Birth/Sex: Treating RN: 12-17-1945 (77 y.o. [name] Primary Care Cassondra Stachowski: [name] Other Clinician: Referring Marieann Zipp: Treating Raymar Joiner/Extender: [name] in Treatment: 22 Vital Signs Time Taken: 13:14 Temperature (F): 97.5 Height (in): 69 Pulse (bpm): 97 Weight (lbs): 190 Respiratory Rate (breaths/min): 18 Body Mass Index (BMI): 28.1 Blood Pressure (mmHg): 107/68 Reference Range: 80 - 120 mg / dl Electronic Signature(s) Banfill, Kyndal H ([id number]) [phone].pdf Page 9 of 9 Signed: 04/29/2023 3:28:11 PM By: Angelina Pih Entered By: Angelina Pih on 04/29/2023 13:14:32

## 2023-05-01 ENCOUNTER — Other Ambulatory Visit: Payer: Self-pay | Admitting: Cardiovascular Disease

## 2023-05-05 ENCOUNTER — Other Ambulatory Visit: Payer: Self-pay

## 2023-05-05 ENCOUNTER — Emergency Department
Admission: EM | Admit: 2023-05-05 | Discharge: 2023-05-05 | Disposition: A | Discharge status: Home / Self Care | Payer: Medicare HMO | Attending: Emergency Medicine | Admitting: Emergency Medicine

## 2023-05-05 ENCOUNTER — Other Ambulatory Visit: Payer: Medicare HMO

## 2023-05-05 DIAGNOSIS — E1010 Type 1 diabetes mellitus with ketoacidosis without coma: Secondary | ICD-10-CM

## 2023-05-05 DIAGNOSIS — Z5329 Procedure and treatment not carried out because of patient's decision for other reasons: Secondary | ICD-10-CM | POA: Insufficient documentation

## 2023-05-05 DIAGNOSIS — E1065 Type 1 diabetes mellitus with hyperglycemia: Secondary | ICD-10-CM | POA: Diagnosis not present

## 2023-05-05 DIAGNOSIS — R739 Hyperglycemia, unspecified: Secondary | ICD-10-CM | POA: Diagnosis present

## 2023-05-05 DIAGNOSIS — R000 Tachycardia, unspecified: Secondary | ICD-10-CM | POA: Diagnosis not present

## 2023-05-05 HISTORY — DX: Dorsalgia, unspecified: M54.9

## 2023-05-05 LAB — URINALYSIS, ROUTINE W REFLEX MICROSCOPIC
Bacteria, UA: NONE SEEN
Bilirubin Urine: NEGATIVE
Glucose, UA: >=500 mg/dL — AB
Ketones, ur: 20 mg/dL — AB
Nitrite: NEGATIVE
Protein, ur: NEGATIVE mg/dL
Specific Gravity, Urine: 1.018 (ref 1.005–1.030)
WBC, UA: >50 WBC/hpf (ref 0–5)
pH: 5 (ref 5.0–8.0)

## 2023-05-05 LAB — BASIC METABOLIC PANEL
Anion gap: 16 — ABNORMAL HIGH (ref 5–15)
BUN: 38 mg/dL — ABNORMAL HIGH (ref 8–23)
CO2: 15 mmol/L — ABNORMAL LOW (ref 22–32)
Calcium: 8.7 mg/dL — ABNORMAL LOW (ref 8.9–10.3)
Chloride: 106 mmol/L (ref 98–111)
Creatinine, Ser: 1.4 mg/dL — ABNORMAL HIGH (ref 0.61–1.24)
GFR, Estimated: 52 mL/min — ABNORMAL LOW (ref >60)
Glucose, Bld: 419 mg/dL — ABNORMAL HIGH (ref 70–99)
Potassium: 4.8 mmol/L (ref 3.5–5.1)
Sodium: 137 mmol/L (ref 135–145)

## 2023-05-05 LAB — BLOOD GAS, VENOUS
Acid-base deficit: 4.3 mmol/L — ABNORMAL HIGH (ref 0.0–2.0)
Bicarbonate: 21.6 mmol/L (ref 20.0–28.0)
O2 Saturation: 63.1 %
Patient temperature: 37
pCO2, Ven: 42 mmHg — ABNORMAL LOW (ref 44–60)
pH, Ven: 7.32 (ref 7.25–7.43)
pO2, Ven: 35 mmHg (ref 32–45)

## 2023-05-05 LAB — CBG MONITORING, ED
Glucose-Capillary: 488 mg/dL — ABNORMAL HIGH (ref 70–99)
Glucose-Capillary: 300 mg/dL — ABNORMAL HIGH (ref 70–99)
Glucose-Capillary: 269 mg/dL — ABNORMAL HIGH (ref 70–99)
Glucose-Capillary: 202 mg/dL — ABNORMAL HIGH (ref 70–99)

## 2023-05-05 LAB — CBC
HCT: 31.8 % — ABNORMAL LOW (ref 39.0–52.0)
Hemoglobin: 9.6 g/dL — ABNORMAL LOW (ref 13.0–17.0)
MCH: 31.6 pg (ref 26.0–34.0)
MCHC: 30.2 g/dL (ref 30.0–36.0)
MCV: 104.6 fL — ABNORMAL HIGH (ref 80.0–100.0)
Platelets: 263 10*3/uL (ref 150–400)
RBC: 3.04 MIL/uL — ABNORMAL LOW (ref 4.22–5.81)
RDW: 16.2 % — ABNORMAL HIGH (ref 11.5–15.5)
WBC: 11.5 10*3/uL — ABNORMAL HIGH (ref 4.0–10.5)
nRBC: 0 % (ref 0.0–0.2)

## 2023-05-05 LAB — BETA-HYDROXYBUTYRIC ACID: Beta-Hydroxybutyric Acid: 3.26 mmol/L — ABNORMAL HIGH (ref 0.05–0.27)

## 2023-05-05 MED ORDER — SODIUM CHLORIDE 0.9 % IV SOLN: Freq: Once | INTRAVENOUS | Status: AC

## 2023-05-05 MED ORDER — DEXTROSE IN LACTATED RINGERS 5 % IV SOLN: INTRAVENOUS | Status: DC

## 2023-05-05 MED ORDER — INSULIN REGULAR(HUMAN) IN NACL 100-0.9 UT/100ML-% IV SOLN
INTRAVENOUS | Status: DC
  Administered 2023-05-05: 6.5 [IU]/h via INTRAVENOUS
  Filled 2023-05-05: qty 100

## 2023-05-05 MED ORDER — DEXTROSE 50 % IV SOLN: 0.0000 mL | INTRAVENOUS | Status: DC | PRN

## 2023-05-05 MED ORDER — POTASSIUM CHLORIDE 10 MEQ/100ML IV SOLN
10.0000 meq | INTRAVENOUS | Status: AC
  Administered 2023-05-05: 10 meq via INTRAVENOUS
  Filled 2023-05-05: qty 100

## 2023-05-05 MED ORDER — LACTATED RINGERS IV BOLUS
20.0000 mL/kg | Freq: Once | INTRAVENOUS | Status: AC
  Administered 2023-05-05: 1542 mL via INTRAVENOUS

## 2023-05-05 MED ORDER — SODIUM CHLORIDE 0.9 % IV BOLUS
1000.0000 mL | Freq: Once | INTRAVENOUS | Status: AC
  Administered 2023-05-05: 1000 mL via INTRAVENOUS

## 2023-05-05 MED ORDER — LACTATED RINGERS IV SOLN: INTRAVENOUS | Status: DC

## 2023-05-05 NOTE — ED Notes (Signed)
NT called phlebotomy for assistance as pt is hard stick currently. Pt's family given urine specimen cup and both pt and family notified sample needed asap.

## 2023-05-05 NOTE — ED Triage Notes (Signed)
Pt scheduled for MRI this morning due to back pain however didn't get it done due to high BG.  Glucometer read 518 BG type 1 diabetes this morning; only 2 units novolog about 30 minutes ago; 1 tamadol this morning and 2 tylenols as well. Pt vomited this morning x1. Pt concerned about DKA.

## 2023-05-05 NOTE — ED Provider Notes (Signed)
[facility] Provider Note    Event Date/Time   First MD Initiated Contact with Patient 05/05/23 [phone]     (approximate)   History   Hyperglycemia   HPI  [name] is a 77 y.o. male  who presents to the emergency department today because of concern for high blood sugar. The patient uses an insulin pump however upon awakening this morning noticed it was running and that the line had broken. Checked his sugar and found it to be high. Did give himself some insulin at home to try to bring it down however started developing lightheadedness and nausea with vomiting. Has had DKA in the past. Additionally patient has been dealing with back pain and was scheduled for an MRI this morning.        Physical Exam   Triage Vital Signs: ED Triage Vitals  Enc Vitals Group     BP 05/05/23 0735 126/62     Pulse Rate 05/05/23 0735 (!) 120     Resp 05/05/23 0735 19     Temp 05/05/23 0735 (!) 97.5 F (36.4 C)     Temp Source 05/05/23 0735 Oral     SpO2 05/05/23 0735 100 %     Weight 05/05/23 0739 170 lb (77.1 kg)     Height 05/05/23 0739 5\' 9"  (1.753 m)     Head Circumference --      Peak Flow --      Pain Score 05/05/23 0739 1     Pain Loc --      Pain Edu? --      Excl. in GC? --     Most recent vital signs: Vitals:   05/05/23 0735  BP: 126/62  Pulse: (!) 120  Resp: 19  Temp: (!) 97.5 F (36.4 C)  SpO2: 100%   General: Awake, alert, oriented. CV:  Good peripheral perfusion. Regular rate and rhythm. Resp:  Normal effort. Lungs clear. Abd:  No distention. Non tender.   ED Results / Procedures / Treatments   Labs (all labs ordered are listed, but only abnormal results are displayed) Labs Reviewed  BASIC METABOLIC PANEL - Abnormal; Notable for the following components:      Result Value   CO2 15 (*)    Glucose, Bld 419 (*)    BUN 38 (*)    Creatinine, Ser 1.40 (*)    Calcium 8.7 (*)    GFR, Estimated 52 (*)    Anion gap 16 (*)    All  other components within normal limits  CBC - Abnormal; Notable for the following components:   WBC 11.5 (*)    RBC 3.04 (*)    Hemoglobin 9.6 (*)    HCT 31.8 (*)    MCV 104.6 (*)    RDW 16.2 (*)    All other components within normal limits  URINALYSIS, ROUTINE W REFLEX MICROSCOPIC - Abnormal; Notable for the following components:   Color, Urine YELLOW (*)    APPearance HAZY (*)    Glucose, UA >=500 (*)    Hgb urine dipstick SMALL (*)    Ketones, ur 20 (*)    Leukocytes,Ua LARGE (*)    All other components within normal limits  BLOOD GAS, VENOUS - Abnormal; Notable for the following components:   pCO2, Ven 42 (*)    Acid-base deficit 4.3 (*)    All other components within normal limits  BETA-HYDROXYBUTYRIC ACID - Abnormal; Notable for the following components:   Beta-Hydroxybutyric Acid  3.26 (*)    All other components within normal limits  CBG MONITORING, ED - Abnormal; Notable for the following components:   Glucose-Capillary 488 (*)    All other components within normal limits  CBG MONITORING, ED - Abnormal; Notable for the following components:   Glucose-Capillary 300 (*)    All other components within normal limits  CBG MONITORING, ED     EKG  None   RADIOLOGY None   PROCEDURES:  Critical Care performed: Yes  CRITICAL CARE Performed by: Phineas Semen   Total critical care time: 30 minutes  Critical care time was exclusive of separately billable procedures and treating other patients.  Critical care was necessary to treat or prevent imminent or life-threatening deterioration.  Critical care was time spent personally by me on the following activities: development of treatment plan with patient and/or surrogate as well as nursing, discussions with consultants, evaluation of patient's response to treatment, examination of patient, obtaining history from patient or surrogate, ordering and performing treatments and interventions, ordering and review of  laboratory studies, ordering and review of radiographic studies, pulse oximetry and re-evaluation of patient's condition.   Procedures    MEDICATIONS ORDERED IN ED: Medications - No data to display   IMPRESSION / MDM / ASSESSMENT AND PLAN / ED COURSE  I reviewed the triage vital signs and the nursing notes.                              Differential diagnosis includes, but is not limited to, DKA, hyperglycemia  Patient's presentation is most consistent with acute presentation with potential threat to life or bodily function.   The patient is on the cardiac monitor to evaluate for evidence of arrhythmia and/or significant heart rate changes.  Patient presented to the emergency department today because of concerns for elevated blood sugar in the setting of his insulin pump malfunctioning.  On exam patient is awake and alert.  Blood work is concerning for elevated blood sugar, elevated anion gap and low bicarb.  Beta hydroxybutyric acid was also elevated.  Because of this I did have concerns for possible DKA.  Patient was started on insulin drip. Will plan on admission to the hospitalist service.      FINAL CLINICAL IMPRESSION(S) / ED DIAGNOSES   Final diagnoses:  Diabetic ketoacidosis without coma associated with type 1 diabetes mellitus ([name])      Note:  This document was prepared using Dragon voice recognition software and may include unintentional dictation errors.    Phineas Semen, MD 05/05/23 [id number]

## 2023-05-05 NOTE — Inpatient Diabetes Management (Addendum)
Inpatient Diabetes Program Recommendations  AACE/ADA: New Consensus Statement on Inpatient Glycemic Control   Target Ranges:  Prepandial:   less than 140 mg/dL      Peak postprandial:   less than 180 mg/dL (1-2 hours)      Critically ill patients:  140 - 180 mg/dL    Latest Reference Range & Units 05/05/23 07:36 05/05/23 10:51 05/05/23 12:35  Glucose-Capillary 70 - 99 mg/dL 829 (H) 562 (H) 130 (H)    Latest Reference Range & Units 05/05/23 07:45  CO2 22 - 32 mmol/L 15 (L)  Glucose 70 - 99 mg/dL 865 (H)  Anion gap 5 - 15  16 (H)    Latest Reference Range & Units 05/05/23 07:45  Beta-Hydroxybutyric Acid 0.05 - 0.27 mmol/L 3.26 (H)   Review of Glycemic Control  Diabetes history: DM1 (does NOT make any insulin; requires basal, correction, and carb coverage insulin) Outpatient Diabetes medications: Insulin Pump with Novolog (total basal 25 units/day; insulin sensitivity 1:40 mg/dl, insulin to carb ratio 1:13-18 grams) Current orders for Inpatient glycemic control: IV insulin  Inpatient Diabetes Program Recommendations:    Insulin: IV insulin should be continued until acidosis has completely cleared. Once acidosis is cleared, if provider allows patient to resume insulin pump patient will need to put on new insulin pump infusion site before restarting pump and provider will need to use Insulin Pump order set to order insulin pump AC&HS and 2am.  IF patient doesn't have new insulin pump supplies at the hospital, will need to transition to SQ insulin regimen; would recommend Semglee 20 units Q24H, CBGs Q4H, Novolog 0-6 units Q4H, and Novolog 3 units TID with meals for meal coverage if patient eats at least 50% of meals.   NOTE: Patient currently in ED with hyperglycemia and noted to be in DKA so started on IV insulin. Initial lab glucose 419 mg/dl at 7:84 am today. Per note by Dr. Derrill Kay today, "patient uses an insulin pump however upon awakening this morning noticed it was running and that the  line had broken. Checked his sugar and found it to be high. Did give himself some insulin at home to try to bring it down however started developing lightheadedness and nausea with vomiting."  Per chart review, patient sees Dr. Tedd [name] (Endocrinology) and was seen on 04/01/23. Per office note on 04/01/23, patient uses an T-Slim insulin pump and the following should be pump settings:   Basal Correction Carb ratio Target BG 12 AM 0.85 unit/hr 40 1:20 120 4 AM 0.75 unit/hr 40 1:13 120 7:30 AM 1.1 unit/hr 40 1:13 120 4 PM 1.2 unit/hr 40 1:18 120  24-hr basal = 25 units Duration of insulin = 5 hrs   Addendum 05/05/23-@14 :20-Spoke with patient and friend at bedside.RN in room at bedside and reports that patient is signing out AMA. Patient and friend state that patient has had DM1 for a very long time and he knows how to manage his DM. Patient reports that sometime during the night, his insulin pump tubing broke so he was not getting any insulin. He states that when he woke up this morning and his sugar was high, he gave himself SQ insulin and CBG came down a little before he got to the hospital for his MRI. Patient states that he uses T-Slim insulin pump and he has all new pump supplies at home. Patient states he is feeling much better and he feels like he can manage his DM at home himself. Patient reports that he will  put on a new insulin pump infusion site as soon as he gets home. Discussed DKA pathophysiology and explained that with DKA, patients are typically dehydrated and acidotic. Patient states that he was in DKA 4 years ago with a similar episode with insulin pump tubing. Patient states that he knows to use SQ insulin injections if glucose will not come down with persistent hyperglycemia on the insulin pump.  Patient appreciative of all the information but still feels he is well enough to go home and resume his insulin pump. Patient and friend aware to stay hydrated and continue to watch glucose closely to  ensure it is controlled.  Thanks, [name] Penner, RN, MSN, [facility] Diabetes Coordinator Inpatient Diabetes Program [phone] (Team Pager from 8am to 5pm)

## 2023-05-06 ENCOUNTER — Encounter: Payer: Medicare HMO | Admitting: Physician Assistant

## 2023-05-06 ENCOUNTER — Telehealth: Payer: Self-pay

## 2023-05-06 DIAGNOSIS — L98412 Non-pressure chronic ulcer of buttock with fat layer exposed: Secondary | ICD-10-CM | POA: Diagnosis not present

## 2023-05-06 DIAGNOSIS — L89623 Pressure ulcer of left heel, stage 3: Secondary | ICD-10-CM | POA: Diagnosis not present

## 2023-05-06 DIAGNOSIS — I110 Hypertensive heart disease with heart failure: Secondary | ICD-10-CM | POA: Diagnosis not present

## 2023-05-06 DIAGNOSIS — I2510 Atherosclerotic heart disease of native coronary artery without angina pectoris: Secondary | ICD-10-CM | POA: Diagnosis not present

## 2023-05-06 DIAGNOSIS — E10622 Type 1 diabetes mellitus with other skin ulcer: Secondary | ICD-10-CM | POA: Diagnosis not present

## 2023-05-06 DIAGNOSIS — L89613 Pressure ulcer of right heel, stage 3: Secondary | ICD-10-CM | POA: Diagnosis not present

## 2023-05-06 DIAGNOSIS — I5042 Chronic combined systolic (congestive) and diastolic (congestive) heart failure: Secondary | ICD-10-CM | POA: Diagnosis not present

## 2023-05-06 DIAGNOSIS — E1051 Type 1 diabetes mellitus with diabetic peripheral angiopathy without gangrene: Secondary | ICD-10-CM | POA: Diagnosis not present

## 2023-05-06 NOTE — Progress Notes (Addendum)
IVIE, [name] ([id number]) [id number] Page 1 of 9 Visit Report for 05/06/2023 Chief Complaint Document Details Patient Name: Date of Service: [name] [name] 05/06/2023 2:00 PM Medical Record Number: [id number] Patient Account Number: [id number] Date of Birth/Sex: Treating RN: [birth year] (77 y.o. [name] Primary Care Provider: Jerl [name] Other Clinician: Referring Provider: Treating Provider/Extender: [name] in Treatment: 23 Information Obtained from: Patient Chief Complaint Left heel pressure ulcer Electronic Signature(s) Signed: 05/06/2023 2:05:20 PM By: [name] Derry [name] Entered By: [name] on 05/06/2023 14:05:20 -------------------------------------------------------------------------------- HPI Details Patient Name: Date of Service: [name] [name] 05/06/2023 2:00 PM Medical Record Number: [id number] Patient Account Number: [id number] Date of Birth/Sex: Treating RN: 23-Oct-1946 (77 y.o. [name] Primary Care Provider: Jerl [name] Other Clinician: Referring Provider: Treating Provider/Extender: [name] in Treatment: 23 History of Present Illness HPI Description: 77 year old male recently seen by his PCP Dr. Mickel [name] who saw him for a wound on the right leg which is less tender and he is continuing to use bacitracin and has completed her course of doxycycline. Past medical history of coronary artery disease, diabetes mellitus type 1, hyperlipidemia, hypertension, MI, plantar fascial fibromatosis, pneumonia, status post foot surgery due to trauma on the left side, coronary angioplasty. he is a former smoker and quit in July 1996. last hemoglobin A1c was 7.8 % in April of this year 5/22 2017 -- biopsy of the right lower extremity was sent for pathology and the report is that of a basal cell carcinoma with edges of the biopsy are involved. I have  called the patient over the phone( 03/28/16) and discussed the pathology report with him and he is agreeable to see a surgeon for excision and need full. I have also spoken personally to [name], of [facility] surgical and referred the patient for further wide excision of this lesion and have communicated this to the patient. READMISSION 08/12/18 This is a now 77 year old man with type 1 diabetes. Hemoglobin A1c on 4/29 was 7.9. He is listed as having a history of PAD in the [place] records although the patient doesn't recall this, doesn't complain of any symptoms of claudication and doesn't believe he has had any prior noninvasive arterial test. He is a nonsmoker. He was here in 2017 with a wound on his Right lower leg. This was biopsied in the clinic and pathology showed a basal cell carcinoma. He [name], [name] ([id number]) [id number] Page 2 of 9 went on to have surgery on this area this is currently where I believe his current wounds are located. He generally bumped the lateral part of his right calf 2 weeks ago and has a superficial abrasion. He also has 2 small open areas on the medial part of the tibia in the same area. Our intake nurse noted a stitch coming out of this which was removed. These of the wound sees actually come to the clinic for. However the more concerning area is an area on the tip of the left great toe. He says that this was initially traumatic and he's been to see [name] podiatrist. [name] been using what I think is Santyl to the wound tip. This has some depth. ABIs in this clinic were non-obtainable on the right and 1.84 on the left. 08/19/18; the patient had arterial studies that did not show evidence of significant hemodynamically compromising occlusions in either leg. There was mild medial atherosclerosis bilaterally. His resting ABIs were within  the normal limits at 1.3 on the right and 1.4 on the left. There was normal posterior and  anterior tibial artery waveforms and velocities listed on both sides. The patient arrives with what appears to be a healthy surface over the tip of his left great toe. This is indeed surprising. Still looks somewhat friable however. More concerning is the x-ray that we did that showed erosions of the tuft of the distal phalanx the left great toe consistent with osteomyelitis. I attempted to pull this x-ray up on colon health Link however I can't open the film to look at this myself. 08/26/18; I reviewed the patient's x-ray and colon helpline. Indeed there is fairly obvious erosion of the tip of his left great toe. The wound was initially a traumatic area hitting it sometime in the middle of night in his kitchen. He is a type I diabetic. I have him on Flagyl and Levaquin that I started last week i.e. 7 days ago. He has an appointment with infectious disease on 09/01/18 09/02/18; the patient was seen by Dr. Rivka [name] of infectious disease. She did not feel the patient needed IV antibiotics. I do feel he needs further oral antibiotics however. He does not need anymore Flagyl but he does need another 2 weeks of Levaquin. The areas just about closed. 09/16/18; the patient is completing his Levaquin today. There is no open wound on the tip of the toe Readmission: 11-26-2022 upon evaluation today patient presents for initial inspection here in our clinic concerning issues that he has been having after having been hospitalized due to septic arthritis. Subsequently he has a PICC line that is been removed and he has been discharged from infectious disease in that regard. Unfortunately he developed shearing wounds over the gluteal region as well as a pressure ulcer over each heel although they are not looking extremely bad nonetheless they do seem to be evidence of pressure which she sustained while hospitalized. Obviously he was very sick and is still recovering as far as that is concerned. Fortunately there  does not appear to be any evidence of active infection systemically which is good news at this point according to Dr. Judye [name] Patient's wounds also do not appear to be too bad at this point which is good news. Patient does have a history of type 1 diabetes mellitus, peripheral vascular disease, coronary artery disease, congestive heart failure, hypertension, and he is on long-term anticoagulant therapy. 12-03-2022 upon evaluation today patient appears to be doing better to some degree in regard to his ulcers on his heels at this point. We are still waiting on the actual appointment with the vascular surgeons but nonetheless in the meantime I do believe that he is making some good progress currently with regard to loosen up the eschar. I did actually feel like that there was some ability for [place] to remove some of the necrotic tissue today and I discussed that with the patient. He is in agreement with that plan. We also can probably see about making an adjustment in the treatment regimen. 12-10-2022 upon evaluation today patient's wounds were really doing about the same. Again he is still awaiting the evaluation with the vascular surgeon. That should have already been done but had to be pushed back unfortunately. He will be seeing them next week on the seventh. I plan to see him following somewhere around the end of the week I think will probably be best. The patient voiced understanding. 12-19-2022 upon evaluation today patient appears to be  doing well currently in regard to his wound we did get the results of his arterial studies which show that he has excellent ABIs with no signs of vascular compromise. For that reason I will go ahead and perform some debridement today to clearway the necrotic debris with his Eliquis that I will be too aggressive so this will still be a stepwise process but we are to go ahead and get that started. 12-27-2022 upon evaluation today patient appears to be doing well  currently in regard to his wounds. They are actually showing signs of excellent improvement. There is some necrotic tissue noted on the surface of wounds to work on clearing some of that away today also I think that we may switch to [facility] dressing to see how things do going forward. He is in agreement with that plan. Subsequently I am extremely pleased with where we stand currently. No fevers, chills, nausea, vomiting, or diarrhea. 01-02-2023 upon evaluation today patient appears to be doing much better in regard to his wounds the right foot appears to be almost healed the left foot though not completely healed is significantly better. Fortunately there does not appear to be any signs of active infection at this point. 01-10-2023 upon evaluation today patient appears to be doing well currently in regard to his wounds. The right heel actually is completely healed the left heel is definitely headed in the right direction and looks to be doing awesome. I do not see any signs of active infection locally nor systemically at this time which is great news. 3/7; his right heel has remained healed. There is no evidence of active infection. The remaining wound is on the lateral aspect of the heel just above the plantar surface 01-30-2023 upon evaluation today patient appears to be doing well currently in regard to his wound. The heel actually showing signs of improvement which is great news and fortunately I do not see any evidence of active infection locally nor systemically at this time. Overall I think that we are headed in the right direction which is excellent here. No fevers, chills, nausea, vomiting, or diarrhea. I am good have to perform some sharp debridement today. This is just the left heel that is remaining open. 02-06-2023 upon evaluation today patient appears to be doing well currently in regard to his wound. Has been tolerating the dressing changes without complication. Fortunately there  does not appear to be any signs of active infection locally nor systemically although he does have some need for sharp debridement with some definite necrotic tissue at the base of the wound today. 02-13-2023 upon evaluation today patient appears to be doing well currently in regard to his wound which I feel like is slowly clearing up. I did review his growth labs as well the sed rate and C-reactive protein are down his white blood cell count was normal his hemoglobin is coming up. He did see Dr. Joylene Draft on Tuesday and she did continue him on the doxycycline considering his lab numbers but nonetheless he seems to be making some pretty good progress here in my opinion. The wound is slowly turnaround serial debridements are obviously helping with that being said I do believe that he is going to still require dressing changes and close monitoring. 02-20-2023 upon evaluation today patient's wound actually appears to be making some slight progress. This is slowly but surely moving into the right direction. Fortunately I do not see any signs of active infection at this time. 02-27-2023 upon evaluation  today patient appears to be doing well currently in regard to his wound. He has been tolerating the dressing changes. I think the Hydrofera Blue is doing a good job and he seems to be doing well with that currently. 03-06-2023 upon evaluation today patient appears to be doing well currently in regard to his wounds were actually seeing signs of improvement which is great news. Fortunately I do not see any evidence of active infection locally nor systemically which is great news and overall I do believe that we are moving in the right direction. 03-13-2023 upon evaluation today patient's wound is actually showing signs of improvement. We actually do have a snap VAC with the bridge today and I am hopeful that this will actually be beneficial for him. Will give this a shot and see how things do. 03-17-2023 upon  evaluation today patient comes in early due to having issues with the snap VAC will need to not maintain a seal. Subsequently we did end up Scharrer, Demani [name] ([id number]) [phone].pdf Page 3 of 9 seeing an and removing the snap VAC due to the fact that there is a lot of maceration and drainage around the wound itself. Upon removal however the wound appears to be doing excellent and in fact was in much better shape that even when I saw her last Thursday. 03-20-2023 upon evaluation today patient appears to be doing well currently in regard to his wound is showing more signs of granulation which is great news and overall I am extremely pleased with where we stand today. I do not see any evidence of active infection locally nor systemically which is great news. No fevers, chills, nausea, vomiting, or diarrhea. 03-28-2023 upon evaluation today patient appears to be doing well currently in regard to his wound although he unfortunately is again macerated. I think a big portion of this is his leg swelling they were unable to really use the juxta lite appropriately over top of the snap VAC. For that reason his leg is extremely swollen as he has not really had any compression on over the past week. This obviously is not good and I discussed that with the patient today as well. I would recommend we take a break for a week yet again and I think that at this point we may need to actually compression wrap him. If organ to continue with the snap VAC I think he needs to be underneath the compression wrap. 04-03-2023 upon evaluation today patient actually appears to be making good progress in my opinion in regard to his leg wound more specifically on the heel. He has been tolerating the dressing changes without complication and I actually feel like with the compression wrapping not only is his leg smaller but this looks much better compared to last week's evaluation. Fortunately I do not see  any evidence of active infection locally nor systemically which is great news. 04-10-2023 upon evaluation today patient appears to be doing well currently in regard to his wound. He is actually tolerating the dressing changes without complication and very pleased in that regard. Fortunately I do not see any signs of active infection at this time. I do think organ to try to go back to the snap VAC today with the compression wrap. 04-15-2023 upon evaluation today patient appears to be doing well currently in regard to his wound. The snap VAC seems to be doing an excellent job. I am actually extremely pleased with where we stand. 04-22-2023 upon evaluation today patient appears  to be doing well currently in regard to his wound which is actually showing signs of improvement. Fortunately I do not see any evidence of active infection locally nor systemically which is great news. No fevers, chills, nausea, vomiting, or diarrhea. 04-29-2023 upon evaluation today patient appears to be doing well currently in regard to his heel is showing signs of excellent improvement and actually very pleased with how things are doing with the snap VAC. 05-06-2023 upon evaluation today patient appears to be doing well with regard to his wound he is showing signs of improvement and very pleased in that regard. Fortunately I do not see any evidence of active infection locally or systemically which is great news and overall I am pleased with where things stand at this point. Electronic Signature(s) Signed: 05/06/2023 2:45:31 PM By: [name] Derry [name] Entered By: [name] on 05/06/2023 14:45:31 -------------------------------------------------------------------------------- Physical Exam Details Patient Name: Date of Service: [name] [name] 05/06/2023 2:00 PM Medical Record Number: [id number] Patient Account Number: [id number] Date of Birth/Sex: Treating RN: 13-Jun-1946 (77 y.o. [name] Primary Care Provider:  Jerl [name] Other Clinician: Referring Provider: Treating Provider/Extender: [name] in Treatment: 23 Constitutional Well-nourished and well-hydrated in no acute distress. Respiratory normal breathing without difficulty. Psychiatric this patient is able to make decisions and demonstrates good insight into disease process. Alert and Oriented x 3. pleasant and cooperative. Notes Upon inspection patient's wound bed actually showed signs again of improvement I think the wound VAC is doing great with using a snap VAC at this point and he seems to be helping this to feeling good collagen to the base of the wound followed by a snap VAC application. Electronic Signature(s) Signed: 05/06/2023 2:45:46 PM By: [name] Derry [name] Entered By: [name] on 05/06/2023 14:45:46 [name], [name] ([id number]) [id number] Page 4 of 9 -------------------------------------------------------------------------------- Physician Orders Details Patient Name: Date of Service: [name] [name] 05/06/2023 2:00 PM Medical Record Number: [id number] Patient Account Number: [id number] Date of Birth/Sex: Treating RN: Mar 21, 1946 (77 y.o. [name] Primary Care Provider: Jerl [name] Other Clinician: Referring Provider: Treating Provider/Extender: [name] in Treatment: 23 Verbal / Phone Orders: No Diagnosis Coding ICD-10 Coding Code Description E10.621 Type 1 diabetes mellitus with foot ulcer I73.89 Other specified peripheral vascular diseases L89.623 Pressure ulcer of left heel, stage 3 L89.613 Pressure ulcer of right heel, stage 3 L98.412 Non-pressure chronic ulcer of buttock with fat layer exposed I25.10 Atherosclerotic heart disease of native coronary artery without angina pectoris I50.42 Chronic combined systolic (congestive) and diastolic (congestive) heart failure I10 Essential (primary)  hypertension Follow-up Appointments ppointment in 1 week. - Tuesday to check snap vac and re wrap Return A Nurse Visit as needed Bathing/ Shower/ Hygiene May shower with wound dressing protected with water repellent cover or cast protector. No tub bath. - no soaking foot in tub Anesthetic (Use 'Patient Medications' Section for Anesthetic Order Entry) Lidocaine applied to wound bed Edema Control - Lymphedema / Segmental Compressive Device / Other Patient to wear own compression stockings. Remove compression stockings every night before going to bed and put on every morning when getting up. - pt has juxtafit velcro wrap-ON HOLD AT THIS TIME Elevate, Exercise Daily and A void Standing for Long Periods of Time. Elevate legs to the level of the heart and pump ankles as often as possible Elevate leg(s) parallel to the floor when sitting. DO YOUR BEST to sleep in the bed at night. DO NOT  sleep in your recliner. Long hours of sitting in a recliner leads to swelling of the legs and/or potential wounds on your backside. Negative Pressure Wound Therapy Snap Vac applied Wound VAC settings at continuous pressure. Use foam to wound cavity. Please order WHITE foam to fill any tunnel/s and/or undermining when necessary. Change VAC dressing 2 X WEEK. Change canister as indicated when full. Number of foam/gauze pieces used in the dressing = - 2, one wedge piece blue foam in wound bed, one foam piece in bridge. Wound Treatment Wound #5 - Calcaneus Wound Laterality: Left Cleanser: Byram Ancillary Kit - 15 Day Supply (Generic) 1 x Per Week/30 Days Discharge Instructions: Use supplies as instructed; Kit contains: (15) Saline Bullets; (15) 3x3 Gauze; 15 pr Gloves Cleanser: Vashe 5.8 (oz) 1 x Per Week/30 Days Discharge Instructions: Use vashe 5.8 (oz) as directed Prim Dressing: Prisma 4.34 (in) 1 x Per Week/30 Days ary Discharge Instructions: packed dry in to deepest part of wound bed Compression  Wrap: Urgo K2 Lite, two layer compression system, regular 1 x Per Week/30 Days [name], [name] ([id number]) [id number] Page 5 of 9 Electronic Signature(s) Signed: 05/06/2023 4:12:56 PM By: Angelina Pih Signed: 05/06/2023 5:11:00 PM By: [name] Derry [name] Entered By: Angelina Pih on 05/06/2023 14:34:33 -------------------------------------------------------------------------------- Problem List Details Patient Name: Date of Service: [name] [name] 05/06/2023 2:00 PM Medical Record Number: [id number] Patient Account Number: [id number] Date of Birth/Sex: Treating RN: February 15, 1946 (77 y.o. [name] Primary Care Provider: Jerl [name] Other Clinician: Referring Provider: Treating Provider/Extender: [name] in Treatment: 23 Active Problems ICD-10 Encounter Code Description Active Date MDM Diagnosis E10.621 Type 1 diabetes mellitus with foot ulcer 11/26/2022 No Yes I73.89 Other specified peripheral vascular diseases 11/26/2022 No Yes L89.623 Pressure ulcer of left heel, stage 3 11/26/2022 No Yes L89.613 Pressure ulcer of right heel, stage 3 11/26/2022 No Yes L98.412 Non-pressure chronic ulcer of buttock with fat layer exposed 11/26/2022 No Yes I25.10 Atherosclerotic heart disease of native coronary artery without angina pectoris 11/26/2022 No Yes I50.42 Chronic combined systolic (congestive) and diastolic (congestive) heart failure 11/26/2022 No Yes I10 Essential (primary) hypertension 11/26/2022 No Yes Inactive Problems Resolved Problems Electronic Signature(s) Signed: 05/06/2023 2:05:17 PM By: [name] Derry [name] Entered By: [name] on 05/06/2023 14:05:17 [name], [name] ([id number]) [id number] Page 6 of 9 -------------------------------------------------------------------------------- Progress Note Details Patient Name: Date of Service: [name] [name] 05/06/2023 2:00  PM Medical Record Number: [id number] Patient Account Number: [id number] Date of Birth/Sex: Treating RN: 02-18-1946 (77 y.o. [name] Primary Care Provider: Jerl [name] Other Clinician: Referring Provider: Treating Provider/Extender: [name] in Treatment: 23 Subjective Chief Complaint Information obtained from Patient Left heel pressure ulcer History of Present Illness (HPI) 77 year old male recently seen by his PCP Dr. Mickel [name] who saw him for a wound on the right leg which is less tender and he is continuing to use bacitracin and has completed her course of doxycycline. Past medical history of coronary artery disease, diabetes mellitus type 1, hyperlipidemia, hypertension, MI, plantar fascial fibromatosis, pneumonia, status post foot surgery due to trauma on the left side, coronary angioplasty. he is a former smoker and quit in July 1996. last hemoglobin A1c was 7.8 % in April of this year 5/22 2017 -- biopsy of the right lower extremity was sent for pathology and the report is that of a basal cell carcinoma with edges of the biopsy are involved. I have called the patient over  the phone( 03/28/16) and discussed the pathology report with him and he is agreeable to see a surgeon for excision and need full. I have also spoken personally to [name], of [facility] surgical and referred the patient for further wide excision of this lesion and have communicated this to the patient. READMISSION 08/12/18 This is a now 77 year old man with type 1 diabetes. Hemoglobin A1c on 4/29 was 7.9. He is listed as having a history of PAD in the [place] records although the patient doesn't recall this, doesn't complain of any symptoms of claudication and doesn't believe he has had any prior noninvasive arterial test. He is a nonsmoker. He was here in 2017 with a wound on his Right lower leg. This was biopsied in the clinic and pathology showed a basal cell carcinoma.  He went on to have surgery on this area this is currently where I believe his current wounds are located. He generally bumped the lateral part of his right calf 2 weeks ago and has a superficial abrasion. He also has 2 small open areas on the medial part of the tibia in the same area. Our intake nurse noted a stitch coming out of this which was removed. These of the wound sees actually come to the clinic for. However the more concerning area is an area on the tip of the left great toe. He says that this was initially traumatic and he's been to see [name] podiatrist. [name] been using what I think is Santyl to the wound tip. This has some depth. ABIs in this clinic were non-obtainable on the right and 1.84 on the left. 08/19/18; the patient had arterial studies that did not show evidence of significant hemodynamically compromising occlusions in either leg. There was mild medial atherosclerosis bilaterally. His resting ABIs were within the normal limits at 1.3 on the right and 1.4 on the left. There was normal posterior and anterior tibial artery waveforms and velocities listed on both sides. The patient arrives with what appears to be a healthy surface over the tip of his left great toe. This is indeed surprising. Still looks somewhat friable however. More concerning is the x-ray that we did that showed erosions of the tuft of the distal phalanx the left great toe consistent with osteomyelitis. I attempted to pull this x-ray up on [facility] health Link however I can't open the film to look at this myself. 08/26/18; I reviewed the patient's x-ray and colon helpline. Indeed there is fairly obvious erosion of the tip of his left great toe. The wound was initially a traumatic area hitting it sometime in the middle of night in his kitchen. He is a type I diabetic. I have him on Flagyl and Levaquin that I started last week i.e. 7 days ago. He has an appointment with infectious disease on 09/01/18 09/02/18;  the patient was seen by Dr. Rivka [name] of infectious disease. She did not feel the patient needed IV antibiotics. I do feel he needs further oral antibiotics however. He does not need anymore Flagyl but he does need another 2 weeks of Levaquin. The areas just about closed. 09/16/18; the patient is completing his Levaquin today. There is no open wound on the tip of the toe Readmission: 11-26-2022 upon evaluation today patient presents for initial inspection here in our clinic concerning issues that he has been having after having been hospitalized due to septic arthritis. Subsequently he has a PICC line that is been removed and he has been discharged from infectious  disease in that regard. Unfortunately he developed shearing wounds over the gluteal region as well as a pressure ulcer over each heel although they are not looking extremely bad nonetheless they do seem to be evidence of pressure which she sustained while hospitalized. Obviously he was very sick and is still recovering as far as that is concerned. Fortunately there does not appear to be any evidence of active infection systemically which is good news at this point according to Dr. Judye [name] Patient's wounds also do not appear to be too bad at this point which is good news. Patient does have a history of type 1 diabetes mellitus, peripheral vascular disease, coronary artery disease, congestive heart failure, hypertension, and he is on long-term anticoagulant therapy. 12-03-2022 upon evaluation today patient appears to be doing better to some degree in regard to his ulcers on his heels at this point. We are still waiting on the actual appointment with the vascular surgeons but nonetheless in the meantime I do believe that he is making some good progress currently with regard to loosen up the eschar. I did actually feel like that there was some ability for [place] to remove some of the necrotic tissue today and I discussed that with the patient. He  is in agreement with that plan. We also can probably see about making an adjustment in the treatment regimen. [name], [name] ([id number]) [id number] Page 7 of 9 12-10-2022 upon evaluation today patient's wounds were really doing about the same. Again he is still awaiting the evaluation with the vascular surgeon. That should have already been done but had to be pushed back unfortunately. He will be seeing them next week on the seventh. I plan to see him following somewhere around the end of the week I think will probably be best. The patient voiced understanding. 12-19-2022 upon evaluation today patient appears to be doing well currently in regard to his wound we did get the results of his arterial studies which show that he has excellent ABIs with no signs of vascular compromise. For that reason I will go ahead and perform some debridement today to clearway the necrotic debris with his Eliquis that I will be too aggressive so this will still be a stepwise process but we are to go ahead and get that started. 12-27-2022 upon evaluation today patient appears to be doing well currently in regard to his wounds. They are actually showing signs of excellent improvement. There is some necrotic tissue noted on the surface of wounds to work on clearing some of that away today also I think that we may switch to [facility] dressing to see how things do going forward. He is in agreement with that plan. Subsequently I am extremely pleased with where we stand currently. No fevers, chills, nausea, vomiting, or diarrhea. 01-02-2023 upon evaluation today patient appears to be doing much better in regard to his wounds the right foot appears to be almost healed the left foot though not completely healed is significantly better. Fortunately there does not appear to be any signs of active infection at this point. 01-10-2023 upon evaluation today patient appears to be doing well currently in  regard to his wounds. The right heel actually is completely healed the left heel is definitely headed in the right direction and looks to be doing awesome. I do not see any signs of active infection locally nor systemically at this time which is great news. 3/7; his right heel has remained healed. There is no evidence of active  infection. The remaining wound is on the lateral aspect of the heel just above the plantar surface 01-30-2023 upon evaluation today patient appears to be doing well currently in regard to his wound. The heel actually showing signs of improvement which is great news and fortunately I do not see any evidence of active infection locally nor systemically at this time. Overall I think that we are headed in the right direction which is excellent here. No fevers, chills, nausea, vomiting, or diarrhea. I am good have to perform some sharp debridement today. This is just the left heel that is remaining open. 02-06-2023 upon evaluation today patient appears to be doing well currently in regard to his wound. Has been tolerating the dressing changes without complication. Fortunately there does not appear to be any signs of active infection locally nor systemically although he does have some need for sharp debridement with some definite necrotic tissue at the base of the wound today. 02-13-2023 upon evaluation today patient appears to be doing well currently in regard to his wound which I feel like is slowly clearing up. I did review his growth labs as well the sed rate and C-reactive protein are down his white blood cell count was normal his hemoglobin is coming up. He did see Dr. Joylene Draft on Tuesday and she did continue him on the doxycycline considering his lab numbers but nonetheless he seems to be making some pretty good progress here in my opinion. The wound is slowly turnaround serial debridements are obviously helping with that being said I do believe that he is going to still require  dressing changes and close monitoring. 02-20-2023 upon evaluation today patient's wound actually appears to be making some slight progress. This is slowly but surely moving into the right direction. Fortunately I do not see any signs of active infection at this time. 02-27-2023 upon evaluation today patient appears to be doing well currently in regard to his wound. He has been tolerating the dressing changes. I think the Hydrofera Blue is doing a good job and he seems to be doing well with that currently. 03-06-2023 upon evaluation today patient appears to be doing well currently in regard to his wounds were actually seeing signs of improvement which is great news. Fortunately I do not see any evidence of active infection locally nor systemically which is great news and overall I do believe that we are moving in the right direction. 03-13-2023 upon evaluation today patient's wound is actually showing signs of improvement. We actually do have a snap VAC with the bridge today and I am hopeful that this will actually be beneficial for him. Will give this a shot and see how things do. 03-17-2023 upon evaluation today patient comes in early due to having issues with the snap VAC will need to not maintain a seal. Subsequently we did end up seeing an and removing the snap VAC due to the fact that there is a lot of maceration and drainage around the wound itself. Upon removal however the wound appears to be doing excellent and in fact was in much better shape that even when I saw her last Thursday. 03-20-2023 upon evaluation today patient appears to be doing well currently in regard to his wound is showing more signs of granulation which is great news and overall I am extremely pleased with where we stand today. I do not see any evidence of active infection locally nor systemically which is great news. No fevers, chills, nausea, vomiting, or diarrhea. 03-28-2023 upon  evaluation today patient appears to be doing well  currently in regard to his wound although he unfortunately is again macerated. I think a big portion of this is his leg swelling they were unable to really use the juxta lite appropriately over top of the snap VAC. For that reason his leg is extremely swollen as he has not really had any compression on over the past week. This obviously is not good and I discussed that with the patient today as well. I would recommend we take a break for a week yet again and I think that at this point we may need to actually compression wrap him. If organ to continue with the snap VAC I think he needs to be underneath the compression wrap. 04-03-2023 upon evaluation today patient actually appears to be making good progress in my opinion in regard to his leg wound more specifically on the heel. He has been tolerating the dressing changes without complication and I actually feel like with the compression wrapping not only is his leg smaller but this looks much better compared to last week's evaluation. Fortunately I do not see any evidence of active infection locally nor systemically which is great news. 04-10-2023 upon evaluation today patient appears to be doing well currently in regard to his wound. He is actually tolerating the dressing changes without complication and very pleased in that regard. Fortunately I do not see any signs of active infection at this time. I do think organ to try to go back to the snap VAC today with the compression wrap. 04-15-2023 upon evaluation today patient appears to be doing well currently in regard to his wound. The snap VAC seems to be doing an excellent job. I am actually extremely pleased with where we stand. 04-22-2023 upon evaluation today patient appears to be doing well currently in regard to his wound which is actually showing signs of improvement. Fortunately I do not see any evidence of active infection locally nor systemically which is great news. No fevers, chills, nausea,  vomiting, or diarrhea. 04-29-2023 upon evaluation today patient appears to be doing well currently in regard to his heel is showing signs of excellent improvement and actually very pleased with how things are doing with the snap VAC. 05-06-2023 upon evaluation today patient appears to be doing well with regard to his wound he is showing signs of improvement and very pleased in that regard. Fortunately I do not see any evidence of active infection locally or systemically which is great news and overall I am pleased with where things stand at this point. [name], [name] ([id number]) [id number] Page 8 of 9 Objective Constitutional Well-nourished and well-hydrated in no acute distress. Vitals Time Taken: 2:10 PM, Height: 69 in, Weight: 190 lbs, BMI: 28.1, Temperature: 97.7 F, Pulse: 94 bpm, Respiratory Rate: 18 breaths/min, Blood Pressure: 114/70 mmHg. Respiratory normal breathing without difficulty. Psychiatric this patient is able to make decisions and demonstrates good insight into disease process. Alert and Oriented x 3. pleasant and cooperative. General Notes: Upon inspection patient's wound bed actually showed signs again of improvement I think the wound VAC is doing great with using a snap VAC at this point and he seems to be helping this to feeling good collagen to the base of the wound followed by a snap VAC application. Integumentary (Hair, Skin) Wound #5 status is Open. Original cause of wound was Pressure Injury. The date acquired was: 10/20/2022. The wound has been in treatment 23 weeks. The wound is located on  the Left Calcaneus. The wound measures 0.8cm length x 0.7cm width x 0.7cm depth; 0.44cm^2 area and 0.308cm^3 volume. There is Fat Layer (Subcutaneous Tissue) exposed. There is no tunneling or undermining noted. There is a medium amount of serosanguineous drainage noted. There is large (67-100%) red granulation within the wound bed. There is a  small (1-33%) amount of necrotic tissue within the wound bed including Adherent Slough. Assessment Active Problems ICD-10 Type 1 diabetes mellitus with foot ulcer Other specified peripheral vascular diseases Pressure ulcer of left heel, stage 3 Pressure ulcer of right heel, stage 3 Non-pressure chronic ulcer of buttock with fat layer exposed Atherosclerotic heart disease of native coronary artery without angina pectoris Chronic combined systolic (congestive) and diastolic (congestive) heart failure Essential (primary) hypertension Plan Follow-up Appointments: Return Appointment in 1 week. - Tuesday to check snap vac and re wrap Nurse Visit as needed Bathing/ Shower/ Hygiene: May shower with wound dressing protected with water repellent cover or cast protector. No tub bath. - no soaking foot in tub Anesthetic (Use 'Patient Medications' Section for Anesthetic Order Entry): Lidocaine applied to wound bed Edema Control - Lymphedema / Segmental Compressive Device / Other: Patient to wear own compression stockings. Remove compression stockings every night before going to bed and put on every morning when getting up. - pt has juxtafit velcro wrap-ON HOLD AT THIS TIME Elevate, Exercise Daily and Avoid Standing for Long Periods of Time. Elevate legs to the level of the heart and pump ankles as often as possible Elevate leg(s) parallel to the floor when sitting. DO YOUR BEST to sleep in the bed at night. DO NOT sleep in your recliner. Long hours of sitting in a recliner leads to swelling of the legs and/or potential wounds on your backside. Negative Pressure Wound Therapy: Snap Vac applied Wound VAC settings at continuous pressure. Use foam to wound cavity. Please order WHITE foam to fill any tunnel/s and/or undermining when necessary. Change VAC dressing 2 X WEEK. Change canister as indicated when full. Number of foam/gauze pieces used in the dressing = - 2, one wedge piece blue foam  in wound bed, one foam piece in bridge. WOUND #5: - Calcaneus Wound Laterality: Left Cleanser: Byram Ancillary Kit - 15 Day Supply (Generic) 1 x Per Week/30 Days Discharge Instructions: Use supplies as instructed; Kit contains: (15) Saline Bullets; (15) 3x3 Gauze; 15 pr Gloves Cleanser: Vashe 5.8 (oz) 1 x Per Week/30 Days Discharge Instructions: Use vashe 5.8 (oz) as directed Prim Dressing: Prisma 4.34 (in) 1 x Per Week/30 Days ary Discharge Instructions: packed dry in to deepest part of wound bed Com pression Wrap: Urgo K2 Lite, two layer compression system, regular 1 x Per Week/30 Days 1. I would recommend that we have the patient continue to monitor for any signs of infection or worsening. Based on what I am seeing I do believe that we are making good headway. 2. I am going to suggest as well that the patient should going continue with the collagen which I think in the base of the wound following the snap VAC [name], [name] ([id number]) [phone].pdf Page 9 of 9 application will help to get this to granulate in faster hopefully this will be of benefit. We will see patient back for reevaluation in 1 week here in the clinic. If anything worsens or changes patient will contact our office for additional recommendations. In regard to his elevated blood sugars things seem to have stabilized more today although he is very swollen from all  the fluids he got yesterday again his blood sugar was in the 500s when he arrived at the hospital yesterday. Electronic Signature(s) Signed: 05/06/2023 2:46:31 PM By: [name] Derry [name] Entered By: [name] on 05/06/2023 14:46:31 -------------------------------------------------------------------------------- SuperBill Details Patient Name: Date of Service: [name] [name] 05/06/2023 Medical Record Number: [id number] Patient Account Number: [id number] Date of Birth/Sex: Treating RN: [birth year] (77 y.o. [name] Primary Care Provider: Jerl [name] Other Clinician: Referring Provider: Treating Provider/Extender: [name] in Treatment: 23 Diagnosis Coding ICD-10 Codes Code Description E10.621 Type 1 diabetes mellitus with foot ulcer I73.89 Other specified peripheral vascular diseases L89.623 Pressure ulcer of left heel, stage 3 L89.613 Pressure ulcer of right heel, stage 3 L98.412 Non-pressure chronic ulcer of buttock with fat layer exposed I25.10 Atherosclerotic heart disease of native coronary artery without angina pectoris I50.42 Chronic combined systolic (congestive) and diastolic (congestive) heart failure I10 Essential (primary) hypertension Facility Procedures : CPT4 Code: 56387564 Description: 33295 Single Use NEG PRESS WND TX <=50 SQ CM Modifier: Quantity: 1 Physician Procedures : CPT4 Code Description Modifier [id number] - WC PHYS LEVEL 3 - EST PT ICD-10 Diagnosis Description E10.621 Type 1 diabetes mellitus with foot ulcer I73.89 Other specified peripheral vascular diseases L89.623 Pressure ulcer of left heel, stage 3  L89.613 Pressure ulcer of right heel, stage 3 Quantity: 1 Electronic Signature(s) Signed: 05/06/2023 4:07:40 PM By: Angelina Pih Signed: 05/06/2023 5:11:00 PM By: [name] Derry [name] Previous Signature: 05/06/2023 2:46:45 PM Version By: [name] Derry [name] Entered By: Angelina Pih on 05/06/2023 16:07:39

## 2023-05-06 NOTE — Telephone Encounter (Signed)
Patient's partner called office for advice on what to do for back pain. Pain has been bothering patient for the past 6 months. States Yisrael was seen by Dr. Retia Passe regarding concern. Had  xray done and believes back pain could be from discitis. Is scheduled for MRI on 7/12.  Is taking tramadol, but does not feel like it helps with pain.  Pain is described as aching nagging at times and then sharp.  Advised they go to ED if pain is getting worse for additional work up.  Juanita Laster, RMA

## 2023-05-06 NOTE — Progress Notes (Addendum)
MUNEEB, VERAS ([id number]) [id number] Page 1 of 9 Visit Report for 05/06/2023 Arrival Information Details Patient Name: Date of Service: [name], [name] 05/06/2023 2:00 PM Medical Record Number: [id number] Patient Account Number: [id number] Date of Birth/Sex: Treating RN: 08-25-1946 (77 y.o. [name] Primary Care Dorena Dorfman: [name] Other Clinician: Referring Jamespaul Secrist: Treating Karlon Schlafer/Extender: [name] in Treatment: 23 Visit Information History Since Last Visit Added or deleted any medications: No Patient Arrived: Wheel Chair Any new allergies or adverse reactions: No Arrival Time: 14:03 Had a fall or experienced change in No Accompanied By: spouse activities of daily living that may affect Transfer Assistance: EasyPivot Patient Lift risk of falls: Patient Identification Verified: Yes Hospitalized since last visit: Yes Secondary Verification Process Completed: Yes Has Dressing in Place as Prescribed: Yes Patient Requires Transmission-Based Precautions: No Has Compression in Place as Prescribed: Yes Patient Has Alerts: Yes Pain Present Now: Yes Patient Alerts: Type I Diabetic eliquis ABI/TBI normal see scan Electronic Signature(s) Signed: 05/06/2023 3:17:31 PM By: Angelina Pih Entered By: Angelina Pih on 05/06/2023 15:17:31 -------------------------------------------------------------------------------- Clinic Level of Care Assessment Details Patient Name: Date of Service: [name], [name] 05/06/2023 2:00 PM Medical Record Number: [id number] Patient Account Number: [id number] Date of Birth/Sex: Treating RN: 12-02-1945 (77 y.o. [name] Primary Care Raynell Scott: [name] Other Clinician: Referring Acea Yagi: Treating Lucy Woolever/Extender: [name] in Treatment: 23 Clinic Level of Care Assessment Items TOOL 1 Quantity Score []  - 0 Use when EandM  and Procedure is performed on INITIAL visit ASSESSMENTS - Nursing Assessment / Reassessment []  - 0 General Physical Exam (combine w/ comprehensive assessment (listed just below) when performed on new pt. evals) []  - 0 Comprehensive Assessment (HX, ROS, Risk Assessments, Wounds Hx, etc.) ASSESSMENTS - Wound and Skin Assessment / Reassessment []  - 0 Dermatologic / Skin Assessment (not related to wound area) [name] ([id number]) [id number] Page 2 of 9 ASSESSMENTS - Ostomy and/or Continence Assessment and Care []  - 0 Incontinence Assessment and Management []  - 0 Ostomy Care Assessment and Management (repouching, etc.) PROCESS - Coordination of Care []  - 0 Simple Patient / Family Education for ongoing care []  - 0 Complex (extensive) Patient / Family Education for ongoing care []  - 0 Staff obtains Chiropractor, Records, T Results / Process Orders est []  - 0 Staff telephones HHA, Nursing Homes / Clarify orders / etc []  - 0 Routine Transfer to another Facility (non-emergent condition) []  - 0 Routine Hospital Admission (non-emergent condition) []  - 0 New Admissions / Manufacturing engineer / Ordering NPWT Apligraf, etc. , []  - 0 Emergency Hospital Admission (emergent condition) PROCESS - Special Needs []  - 0 Pediatric / Minor Patient Management []  - 0 Isolation Patient Management []  - 0 Hearing / Language / Visual special needs []  - 0 Assessment of Community assistance (transportation, D/C planning, etc.) []  - 0 Additional assistance / Altered mentation []  - 0 Support Surface(s) Assessment (bed, cushion, seat, etc.) INTERVENTIONS - Miscellaneous []  - 0 External ear exam []  - 0 Patient Transfer (multiple staff / Nurse, adult / Similar devices) []  - 0 Simple Staple / Suture removal (25 or less) []  - 0 Complex Staple / Suture removal (26 or more) []  - 0 Hypo/Hyperglycemic Management (do not check if billed separately) []  - 0 Ankle /  Brachial Index (ABI) - do not check if billed separately Has the patient been seen at the hospital within the last three years: Yes Total Score: 0 Level Of  Care: ____ Electronic Signature(s) Signed: 05/06/2023 4:12:56 PM By: Angelina Pih Entered By: Angelina Pih on 05/06/2023 16:06:41 -------------------------------------------------------------------------------- Encounter Discharge Information Details Patient Name: Date of Service: [name], [name] 05/06/2023 2:00 PM Medical Record Number: [id number] Patient Account Number: [id number] Date of Birth/Sex: Treating RN: 16-Mar-1946 (77 y.o. [name] Primary Care Nivan Melendrez: [name] Other Clinician: Referring Michalina Calbert: Treating Cherre Kothari/Extender: [name] in Treatment: 23 Encounter Discharge Information Items [name], [name] ([id number]) [id number] Page 3 of 9 Discharge Condition: Stable Ambulatory Status: Wheelchair Discharge Destination: Home Transportation: Private Auto Accompanied By: spouse Schedule Follow-up Appointment: Yes Clinical Summary of Care: Electronic Signature(s) Signed: 05/06/2023 4:08:59 PM By: Angelina Pih Entered By: Angelina Pih on 05/06/2023 16:08:58 -------------------------------------------------------------------------------- Lower Extremity Assessment Details Patient Name: Date of Service: [name], [name] 05/06/2023 2:00 PM Medical Record Number: [id number] Patient Account Number: [id number] Date of Birth/Sex: Treating RN: Apr 22, 1946 (77 y.o. [name] Primary Care Elohim Brune: [name] Other Clinician: Referring Cayce Quezada: Treating Baily Hovanec/Extender: [name] in Treatment: 23 Edema Assessment Assessed: [Left: No] [Right: No] Edema: [Left: Ye] [Right: s] Calf Left: Right: Point of Measurement: 34 cm From Medial Instep 32 cm Ankle Left: Right: Point of Measurement: 11  cm From Medial Instep 24.9 cm Vascular Assessment Pulses: Dorsalis Pedis Palpable: [Left:Yes] Electronic Signature(s) Signed: 05/06/2023 4:12:56 PM By: Angelina Pih Entered By: Angelina Pih on 05/06/2023 14:25:04 [name] ([id number]) [id number] Page 4 of 9 -------------------------------------------------------------------------------- Multi Wound Chart Details Patient Name: Date of Service: [name], [name] 05/06/2023 2:00 PM Medical Record Number: [id number] Patient Account Number: [id number] Date of Birth/Sex: Treating RN: July 28, 1946 (77 y.o. [name] Primary Care Sang Blount: [name] Other Clinician: Referring Lashon Beringer: Treating Meribeth Vitug/Extender: [name] in Treatment: 23 Vital Signs Height(in): 69 Pulse(bpm): 94 Weight(lbs): 190 Blood Pressure(mmHg): 114/70 Body Mass Index(BMI): 28.1 Temperature(F): 97.7 Respiratory Rate(breaths/min): 18 [5:Photos:] [N/A:N/A] Left Calcaneus N/A N/A Wound Location: Pressure Injury N/A N/A Wounding Event: Pressure Ulcer N/A N/A Primary Etiology: Coronary Artery Disease, N/A N/A Comorbid History: Hypertension, Type I Diabetes, Osteoarthritis, Received Chemotherapy 10/20/2022 N/A N/A Date Acquired: 19 N/A N/A Weeks of Treatment: Open N/A N/A Wound Status: No N/A N/A Wound Recurrence: 0.8x0.7x0.7 N/A N/A Measurements L x W x D (cm) 0.44 N/A N/A A (cm) : rea 0.308 N/A N/A Volume (cm) : 90.70% N/A N/A % Reduction in A rea: 34.60% N/A N/A % Reduction in Volume: Category/Stage III N/A N/A Classification: Medium N/A N/A Exudate A mount: Serosanguineous N/A N/A Exudate Type: red, brown N/A N/A Exudate Color: Large (67-100%) N/A N/A Granulation A mount: Red N/A N/A Granulation Quality: Small (1-33%) N/A N/A Necrotic A mount: Fat Layer (Subcutaneous Tissue): Yes N/A N/A Exposed Structures: None N/A  N/A Epithelialization: Treatment Notes Electronic Signature(s) Signed: 05/06/2023 3:19:07 PM By: Angelina Pih Entered By: Angelina Pih on 05/06/2023 15:19:06 -------------------------------------------------------------------------------- Multi-Disciplinary Care Plan Details Patient Name: Date of Service: [name], [name] 05/06/2023 2:00 PM Pavao, [name] ([id number]) [id number] Page 5 of 9 Medical Record Number: [id number] Patient Account Number: [id number] Date of Birth/Sex: Treating RN: [birth year] (77 y.o. [name] Primary Care Evanthia Maund: [name] Other Clinician: Referring Gwenlyn Hottinger: Treating Kaytlin Burklow/Extender: [name] in Treatment: 23 Active Inactive Pressure Nursing Diagnoses: Potential for impaired tissue integrity related to pressure, friction, moisture, and shear Goals: Patient will remain free of pressure ulcers Date Initiated: 11/26/2022 Target Resolution Date: 05/12/2023 Goal Status: Active Interventions: Assess: immobility, friction, shearing,  incontinence upon admission and as needed Assess offloading mechanisms upon admission and as needed Assess potential for pressure ulcer upon admission and as needed Notes: Electronic Signature(s) Signed: 05/06/2023 4:07:58 PM By: Angelina Pih Entered By: Angelina Pih on 05/06/2023 16:07:58 -------------------------------------------------------------------------------- Negative Pressure Wound Therapy Maintenance (NPWT) Details Patient Name: Date of Service: [name], [name] 05/06/2023 2:00 PM Medical Record Number: [id number] Patient Account Number: [id number] Date of Birth/Sex: Treating RN: 20-Sep-1946 (77 y.o. [name] Primary Care Karrissa Parchment: [name] Other Clinician: Referring Satvik Parco: Treating Addaline Peplinski/Extender: [name] in Treatment: 23 NPWT Maintenance Performed for: Wound #5 Left  Calcaneus Performed By: Angelina Pih, RN Type: Other Coverage Size (sq cm): 0.56 Pressure Type: Constant Pressure Setting: 125 mmHG Drain Type: None Primary Contact: Other : Sponge/Dressing Type: Foam- Blue Date Initiated: 03/13/2023 Dressing Removed: Yes Quantity of Sponges/Gauze Removed: 2 Canister Changed: Yes Canister Exudate Volume: 0 Dressing Reapplied: Yes Quantity of Sponges/Gauze Inserted: 2 Respones T Treatment: o tolerated without issue Days On NPWT : 55 Post Procedure Diagnosis Same as Pre-procedure [name], [name] ([id number]) [id number] Page 6 of 9 Electronic Signature(s) Signed: 05/06/2023 3:20:04 PM By: Angelina Pih Entered By: Angelina Pih on 05/06/2023 15:20:04 -------------------------------------------------------------------------------- Pain Assessment Details Patient Name: Date of Service: [name], [name] 05/06/2023 2:00 PM Medical Record Number: [id number] Patient Account Number: [id number] Date of Birth/Sex: Treating RN: 1946-02-20 (77 y.o. [name] Primary Care Naaman Curro: [name] Other Clinician: Referring Rylei Codispoti: Treating Cherylin Waguespack/Extender: [name] in Treatment: 23 Active Problems Location of Pain Severity and Description of Pain Patient Has Paino Yes Site Locations Rate the pain. Current Pain Level: 5 Pain Management and Medication Current Pain Management: Notes pt having pain in his back Electronic Signature(s) Signed: 05/06/2023 4:12:56 PM By: Angelina Pih Entered By: Angelina Pih on 05/06/2023 14:23:36 [name], [name] ([id number]) [id number] Page 7 of 9 -------------------------------------------------------------------------------- Patient/Caregiver Education Details Patient Name: Date of Service: [name], [name] 6/25/2024andnbsp2:00 PM Medical Record Number: [id number] Patient Account Number:  [id number] Date of Birth/Gender: Treating RN: April 28, 1946 (77 y.o. [name] Primary Care Physician: [name] Other Clinician: Referring Physician: Treating Physician/Extender: [name] in Treatment: 23 Education Assessment Education Provided To: Patient and Caregiver Education Topics Provided Wound/Skin Impairment: Handouts: Caring for Your Ulcer Methods: Explain/Verbal Responses: State content correctly Electronic Signature(s) Signed: 05/06/2023 4:12:56 PM By: Angelina Pih Entered By: Angelina Pih on 05/06/2023 16:08:17 -------------------------------------------------------------------------------- Wound Assessment Details Patient Name: Date of Service: [name], [name] 05/06/2023 2:00 PM Medical Record Number: [id number] Patient Account Number: [id number] Date of Birth/Sex: Treating RN: Feb 07, 1946 (77 y.o. [name] Primary Care Chane Magner: [name] Other Clinician: Referring Lakara Weiland: Treating Chanta Bauers/Extender: [name] in Treatment: 23 Wound Status Wound Number: 5 Primary Pressure Ulcer Etiology: Wound Location: Left Calcaneus Wound Open Wounding Event: Pressure Injury Status: Date Acquired: 10/20/2022 Comorbid Coronary Artery Disease, Hypertension, Type I Diabetes, Weeks Of Treatment: 23 History: Osteoarthritis, Received Chemotherapy Clustered Wound: No Photos Wound Measurements Length: (cm) 0.8 [name], [name] ([id number]) Width: (cm) Depth: (cm) Area: (cm) Volume: (cm) % Reduction in Area: 90.7% [id number] Page 8 of 9 0.7 % Reduction in Volume: 34.6% 0.7 Epithelialization: None 0.44 Tunneling: No 0.308 Undermining: No Wound Description Classification: Category/Stage III Exudate Amount: Medium Exudate Type: Serosanguineous Exudate Color: red, brown Foul Odor After Cleansing: No Slough/Fibrino Yes Wound Bed Granulation Amount:  Large (67-100%) Exposed Structure Granulation Quality: Red Fat Layer (Subcutaneous Tissue) Exposed: Yes  Necrotic Amount: Small (1-33%) Necrotic Quality: Adherent Slough Treatment Notes Wound #5 (Calcaneus) Wound Laterality: Left Cleanser Byram Ancillary Kit - 15 Day Supply Discharge Instruction: Use supplies as instructed; Kit contains: (15) Saline Bullets; (15) 3x3 Gauze; 15 pr Gloves Vashe 5.8 (oz) Discharge Instruction: Use vashe 5.8 (oz) as directed Peri-Wound Care Topical Primary Dressing Prisma 4.34 (in) Discharge Instruction: packed dry in to deepest part of wound bed Secondary Dressing Secured With Compression Wrap Urgo K2 Lite, two layer compression system, regular Compression Stockings Add-Ons Electronic Signature(s) Signed: 05/06/2023 4:12:56 PM By: Angelina Pih Entered By: Angelina Pih on 05/06/2023 14:24:04 -------------------------------------------------------------------------------- Vitals Details Patient Name: Date of Service: [name], [name] 05/06/2023 2:00 PM Medical Record Number: [id number] Patient Account Number: [id number] Date of Birth/Sex: Treating RN: 1945-12-12 (77 y.o. [name] Primary Care Claudis Giovanelli: [name] Other Clinician: Referring Ivone Licht: Treating Tavien Chestnut/Extender: [name] in Treatment: 23 Vital Signs Time Taken: 14:10 Temperature (F): 97.7 [name], [name] [name] ([id number]) [id number] Page 9 of 9 Height (in): 69 Pulse (bpm): 94 Weight (lbs): 190 Respiratory Rate (breaths/min): 18 Body Mass Index (BMI): 28.1 Blood Pressure (mmHg): 114/70 Reference Range: 80 - 120 mg / dl Electronic Signature(s) Signed: 05/06/2023 4:12:56 PM By: Angelina Pih Entered By: Angelina Pih on 05/06/2023 14:23:23

## 2023-05-06 NOTE — Telephone Encounter (Signed)
Per [name] patient has never had degeneration disc disease. Okay to proceed with MRI and have ESR/ CRP after. If patient starts experiencing fever will need to go to ED.  Okay to schedule appointment to discuss concerned if needed.  Spoke with partner Luisa Hart who states that MRI will be done at [facility]. Dr. Penny Pia office is working on getting appointment moved up. Partner would like to know if we could check [facility] radiology to see what is the earliest appointment available. Understands if pt develops fever to go to ED for evaluation.  Juanita Laster, RMA

## 2023-05-07 ENCOUNTER — Encounter: Payer: Self-pay | Admitting: Physical Therapy

## 2023-05-13 ENCOUNTER — Ambulatory Visit
Admission: RE | Admit: 2023-05-13 | Discharge: 2023-05-13 | Disposition: A | Discharge status: Home / Self Care | Payer: Medicare HMO | Source: Ambulatory Visit | Attending: Rehabilitation | Admitting: Rehabilitation

## 2023-05-13 ENCOUNTER — Telehealth: Payer: Self-pay

## 2023-05-13 ENCOUNTER — Encounter: Payer: Medicare HMO | Attending: Physician Assistant | Admitting: Physician Assistant

## 2023-05-13 ENCOUNTER — Other Ambulatory Visit: Payer: Self-pay | Admitting: Rehabilitation

## 2023-05-13 ENCOUNTER — Ambulatory Visit: Payer: Medicare HMO | Admitting: Cardiovascular Disease

## 2023-05-13 DIAGNOSIS — I2510 Atherosclerotic heart disease of native coronary artery without angina pectoris: Secondary | ICD-10-CM | POA: Diagnosis not present

## 2023-05-13 DIAGNOSIS — I5042 Chronic combined systolic (congestive) and diastolic (congestive) heart failure: Secondary | ICD-10-CM | POA: Insufficient documentation

## 2023-05-13 DIAGNOSIS — L98412 Non-pressure chronic ulcer of buttock with fat layer exposed: Secondary | ICD-10-CM | POA: Diagnosis not present

## 2023-05-13 DIAGNOSIS — E10621 Type 1 diabetes mellitus with foot ulcer: Secondary | ICD-10-CM | POA: Insufficient documentation

## 2023-05-13 DIAGNOSIS — L89613 Pressure ulcer of right heel, stage 3: Secondary | ICD-10-CM | POA: Insufficient documentation

## 2023-05-13 DIAGNOSIS — M5136 Other intervertebral disc degeneration, lumbar region: Secondary | ICD-10-CM | POA: Diagnosis not present

## 2023-05-13 DIAGNOSIS — I110 Hypertensive heart disease with heart failure: Secondary | ICD-10-CM | POA: Diagnosis not present

## 2023-05-13 DIAGNOSIS — M47816 Spondylosis without myelopathy or radiculopathy, lumbar region: Secondary | ICD-10-CM

## 2023-05-13 DIAGNOSIS — L89623 Pressure ulcer of left heel, stage 3: Secondary | ICD-10-CM | POA: Insufficient documentation

## 2023-05-13 DIAGNOSIS — M5451 Vertebrogenic low back pain: Secondary | ICD-10-CM

## 2023-05-13 DIAGNOSIS — Z85828 Personal history of other malignant neoplasm of skin: Secondary | ICD-10-CM | POA: Insufficient documentation

## 2023-05-13 DIAGNOSIS — Z87891 Personal history of nicotine dependence: Secondary | ICD-10-CM | POA: Diagnosis not present

## 2023-05-13 DIAGNOSIS — M4854XA Collapsed vertebra, not elsewhere classified, thoracic region, initial encounter for fracture: Secondary | ICD-10-CM | POA: Diagnosis not present

## 2023-05-13 NOTE — Telephone Encounter (Signed)
Transition Care Management Unsuccessful Follow-up Telephone Call  Date of discharge and from where:  [place] 6/24  Attempts:  1st Attempt  Reason for unsuccessful TCM follow-up call:  Unable to leave message   Lenard Forth [facility] Guide, [facility] [phone] [address] [place], [place], [place] [address] Phone: [phone] Email: [email or web address]

## 2023-05-13 NOTE — Progress Notes (Signed)
MALLIK, RIZK ([id number]) [id number] Page 1 of 8 Visit Report for 05/13/2023 Arrival Information Details Patient Name: Date of Service: [name], [name] Soda 05/13/2023 8:15 A M Medical Record Number: [id number] Patient Account Number: [id number] Date of Birth/Sex: Treating RN: 05-08-1946 (77 y.o. [name] [name] Primary Care [name] [name]: [name] [name] Other Clinician: Referring [name] [name]: Treating [name] [name]/Extender: [name] [name] in Treatment: 24 Visit Information History Since Last Visit Added or deleted any medications: No Patient Arrived: Ambulatory Has Dressing in Place as Prescribed: Yes Arrival Time: 08:26 Has Compression in Place as Prescribed: Yes Accompanied By: friend Pain Present Now: No Transfer Assistance: None Patient Identification Verified: Yes Secondary Verification Process Completed: Yes Patient Requires Transmission-Based Precautions: No Patient Has Alerts: Yes Patient Alerts: Type I Diabetic eliquis ABI/TBI normal see scan Electronic Signature(s) Signed: 05/13/2023 11:19:47 AM By: [name] [name], BSN, RN, CWS, Kim RN, BSN Entered By: [name] [name], BSN, RN, CWS, Kim on 05/13/2023 08:26:33 -------------------------------------------------------------------------------- Encounter Discharge Information Details Patient Name: Date of Service: [name], [name] [name] 05/13/2023 8:15 A M Medical Record Number: [id number] Patient Account Number: [id number] Date of Birth/Sex: Treating RN: [birth year] (77 y.o. [name] [name] Primary Care [name] [name]: [name] [name] Other Clinician: Referring Krystol Rocco: Treating [name] [name]/Extender: [name] [name] in Treatment: 24 Encounter Discharge Information Items Post Procedure Vitals Discharge Condition: Stable Temperature (F): 97.6 Ambulatory Status: Wheelchair Pulse (bpm): 105 Discharge Destination: Home Respiratory Rate (breaths/min): 16 Transportation: Private Auto Blood  Pressure (mmHg): 133/70 Accompanied By: Partner Schedule Follow-up Appointment: Yes Clinical Summary of Care: [name] [name], [name] [name] ([id number]) [id number] Page 2 of 8 Electronic Signature(s) Signed: 05/13/2023 11:19:47 AM By: [name] [name], BSN, RN, CWS, Kim RN, BSN Entered By: [name] [name], BSN, RN, CWS, Kim on 05/13/2023 09:14:04 -------------------------------------------------------------------------------- Lower Extremity Assessment Details Patient Name: Date of Service: [name], [name] [name] 05/13/2023 8:15 A M Medical Record Number: [id number] Patient Account Number: [id number] Date of Birth/Sex: Treating RN: Jan 03, 1946 (77 y.o. [name] [name] Primary Care [name] [name]: [name] [name] Other Clinician: Referring [name] [name]: Treating [name] [name]/Extender: [name] [name] in Treatment: 24 Edema Assessment Assessed: [name] [name]: Yes] [name] [name]: No] Edema: [Left: Ye] [Right: s] Calf Left: Right: Point of Measurement: 34 cm From Medial Instep 33 cm Ankle Left: Right: Point of Measurement: 11 cm From Medial Instep 26 cm Vascular Assessment Pulses: Dorsalis Pedis Palpable: [Left:Yes] Electronic Signature(s) Signed: 05/13/2023 10:27:54 AM By: [name] [name], BSN, RN, CWS, Kim RN, BSN Entered By: [name] [name], BSN, RN, CWS, Kim on 05/13/2023 10:27:54 -------------------------------------------------------------------------------- Multi Wound Chart Details Patient Name: Date of Service: [name], [name] [name] 05/13/2023 8:15 A M Medical Record Number: [id number] Patient Account Number: [id number] Date of Birth/Sex: Treating RN: 22-Jun-1946 (77 y.o. [name] [name] Primary Care Keyani Rigdon: [name] [name] Other Clinician: Referring [name] [name]: Treating [name] [name]/Extender: [name] [name] in Treatment: 24 Vital Signs [name] [name] [name] ([id number]) [id number] Page 3 of 8 Height(in): 69 Pulse(bpm): 105 Weight(lbs): 190 Blood Pressure(mmHg):  133/70 Body Mass Index(BMI): 28.1 Temperature(F): 97.6 Respiratory Rate(breaths/min): 18 [5:Photos:] [N/A:N/A] Left Calcaneus N/A N/A Wound Location: Pressure Injury N/A N/A Wounding Event: Pressure Ulcer N/A N/A Primary Etiology: Coronary Artery Disease, N/A N/A Comorbid History: Hypertension, Type I Diabetes, Osteoarthritis, Received Chemotherapy 10/20/2022 N/A N/A Date Acquired: 24 N/A N/A Weeks of Treatment: Open N/A N/A Wound Status: No N/A N/A Wound Recurrence: 0.9x1.2x0.7 N/A N/A Measurements L x W x D (cm) 0.848 N/A N/A A (cm) : rea 0.594 N/A N/A Volume (cm) : 82.00% N/A N/A % Reduction in A rea: -26.10% N/A N/A %  Reduction in Volume: 7 Position 1 (o'clock): 1.5 Maximum Distance 1 (cm): 9 Starting Position 1 (o'clock): 12 Ending Position 1 (o'clock): 0.6 Maximum Distance 1 (cm): Yes N/A N/A Tunneling: Yes N/A N/A Undermining: Category/Stage III N/A N/A Classification: Medium N/A N/A Exudate A mount: Serosanguineous N/A N/A Exudate Type: red, brown N/A N/A Exudate Color: Large (67-100%) N/A N/A Granulation A mount: Red N/A N/A Granulation Quality: Small (1-33%) N/A N/A Necrotic A mount: Fat Layer (Subcutaneous Tissue): Yes N/A N/A Exposed Structures: None N/A N/A Epithelialization: Debridement - Excisional N/A N/A Debridement: Pre-procedure Verification/Time Out 08:45 N/A N/A Taken: Subcutaneous, Slough N/A N/A Tissue Debrided: Skin/Subcutaneous Tissue N/A N/A Level: 0.85 N/A N/A Debridement A (sq cm): rea Curette N/A N/A Instrument: Moderate N/A N/A Bleeding: Silver Nitrate N/A N/A Hemostasis A chieved: Procedure was tolerated well N/A N/A Debridement Treatment Response: 0.9x1.2x0.9 N/A N/A Post Debridement Measurements L x W x D (cm) 0.763 N/A N/A Post Debridement Volume: (cm) Category/Stage III N/A N/A Post Debridement Stage: Debridement N/A N/A Procedures Performed: Treatment Notes Wound #5 (Calcaneus) Wound  Laterality: Left Cleanser Byram Ancillary Kit - 15 Day Supply Discharge Instruction: Use supplies as instructed; Kit contains: (15) Saline Bullets; (15) 3x3 Gauze; 15 pr Gloves Vashe 5.8 (oz) Discharge Instruction: Use vashe 5.8 (oz) as directed Peri-Wound Care Topical Primary Dressing [name] [name], [name] [name] ([id number]) [id number] Page 4 of 8 Prisma 4.34 (in) Discharge Instruction: packed dry in to deepest part of wound bed Secondary Dressing Secured With Compression Wrap Urgo K2 Lite, two layer compression system, regular Compression Stockings Add-Ons Electronic Signature(s) Signed: 05/13/2023 10:28:44 AM By: [name] [name], BSN, RN, CWS, Kim RN, BSN Entered By: [name] [name], BSN, RN, CWS, Kim on 05/13/2023 10:28:44 -------------------------------------------------------------------------------- Multi-Disciplinary Care Plan Details Patient Name: Date of Service: [name], [name] [name] 05/13/2023 8:15 A M Medical Record Number: [id number] Patient Account Number: [id number] Date of Birth/Sex: Treating RN: [birth year] (77 y.o. [name] [name] Primary Care Amali Uhls: [name] [name] Other Clinician: Referring Aldina Porta: Treating Monte Bronder/Extender: [name] [name] in Treatment: 24 Active Inactive Pressure Nursing Diagnoses: Potential for impaired tissue integrity related to pressure, friction, moisture, and shear Goals: Patient will remain free of pressure ulcers Date Initiated: 11/26/2022 Target Resolution Date: 05/12/2023 Goal Status: Active Interventions: Assess: immobility, friction, shearing, incontinence upon admission and as needed Assess offloading mechanisms upon admission and as needed Assess potential for pressure ulcer upon admission and as needed Notes: Electronic Signature(s) Signed: 05/13/2023 11:19:47 AM By: [name] [name], BSN, RN, CWS, Kim RN, BSN Entered By: [name] [name], BSN, RN, CWS, Kim on 05/13/2023 09:12:20 [name], [name] [name] ([id number])  [id number] Page 5 of 8 -------------------------------------------------------------------------------- Pain Assessment Details Patient Name: Date of Service: [name], [name] [name] 05/13/2023 8:15 A M Medical Record Number: [id number] Patient Account Number: [id number] Date of Birth/Sex: Treating RN: 1946/07/06 (77 y.o. [name] [name] Primary Care Allien Melberg: [name] [name] Other Clinician: Referring Sheketa Ende: Treating Julionna Marczak/Extender: [name] [name] in Treatment: 24 Active Problems Location of Pain Severity and Description of Pain Patient Has Paino No Site Locations Pain Management and Medication Current Pain Management: Notes pt having pain in his back Electronic Signature(s) Signed: 05/13/2023 11:19:47 AM By: [name] [name], BSN, RN, CWS, Kim RN, BSN Entered By: [name] [name], BSN, RN, CWS, Kim on 05/13/2023 08:28:03 -------------------------------------------------------------------------------- Patient/Caregiver Education Details Patient Name: Date of Service: [name], [name] Soda 7/2/2024andnbsp8:15 A M Medical Record Number: [id number] Patient Account Number: [id number] Date of Birth/Gender: Treating RN: 1946/09/13 (78 y.o. [name] [name] Primary Care Physician: [name] [name] Other Clinician: Thul,  [name] [name] ([id number]) [id number] Page 6 of 8 Referring Physician: Treating Physician/Extender: [name] [name] in Treatment: 24 Education Assessment Education Provided To: Patient Education Topics Provided Wound/Skin Impairment: Handouts: Caring for Your Ulcer, Other: continue wound care as prescribed Electronic Signature(s) Signed: 05/13/2023 11:19:47 AM By: [name] [name], BSN, RN, CWS, Kim RN, BSN Entered By: [name] [name], BSN, RN, CWS, Kim on 05/13/2023 09:12:41 -------------------------------------------------------------------------------- Wound Assessment Details Patient Name: Date of Service: PO  [name], [name] [name] 05/13/2023 8:15 A M Medical Record Number: [id number] Patient Account Number: [id number] Date of Birth/Sex: Treating RN: [birth year] (77 y.o. Loel Lofty, Selena Batten Primary Care Effa Yarrow: [name] [name] Other Clinician: Referring Zaveon Gillen: Treating Caroly Purewal/Extender: [name] [name] in Treatment: 24 Wound Status Wound Number: 5 Primary Diabetic Wound/Ulcer of the Lower Extremity Etiology: Wound Location: Left Calcaneus Secondary Pressure Ulcer Wounding Event: Pressure Injury Etiology: Date Acquired: 10/20/2022 Wound Open Weeks Of Treatment: 24 Status: Clustered Wound: No Comorbid Coronary Artery Disease, Hypertension, Type I Diabetes, History: Osteoarthritis, Received Chemotherapy Photos Wound Measurements Length: (cm) 0.9 Width: (cm) 1.2 Depth: (cm) 0.7 Area: (cm) 0.848 Volume: (cm) 0.594 % Reduction in Area: 82% % Reduction in Volume: -26.1% Epithelialization: None Tunneling: Yes Position (o'clock): 7 Maximum Distance: (cm) 1.5 Undermining: Yes Starting Position (o'clock): 9 [name], [name] [name] ([id number]) [id number] Page 7 of 8 Ending Position (o'clock): 12 Maximum Distance: (cm) 0.6 Wound Description Classification: Grade 2 Wound Margin: Flat and Intact Exudate Amount: Medium Exudate Type: Serosanguineous Exudate Color: red, brown Foul Odor After Cleansing: No Slough/Fibrino Yes Wound Bed Granulation Amount: Large (67-100%) Exposed Structure Granulation Quality: Red Fat Layer (Subcutaneous Tissue) Exposed: Yes Necrotic Amount: Small (1-33%) Necrotic Quality: Adherent Slough Treatment Notes Wound #5 (Calcaneus) Wound Laterality: Left Cleanser Byram Ancillary Kit - 15 Day Supply Discharge Instruction: Use supplies as instructed; Kit contains: (15) Saline Bullets; (15) 3x3 Gauze; 15 pr Gloves Vashe 5.8 (oz) Discharge Instruction: Use vashe 5.8 (oz) as directed Peri-Wound Care Topical Primary  Dressing Prisma 4.34 (in) Discharge Instruction: packed dry in to deepest part of wound bed Secondary Dressing Secured With Compression Wrap Urgo K2 Lite, two layer compression system, regular Compression Stockings Add-Ons Electronic Signature(s) Signed: 05/13/2023 10:30:57 AM By: [name] [name], BSN, RN, CWS, Kim RN, BSN Entered By: [name] [name], BSN, RN, CWS, Kim on 05/13/2023 10:30:56 -------------------------------------------------------------------------------- Vitals Details Patient Name: Date of Service: [name], [name] [name] 05/13/2023 8:15 A M Medical Record Number: [id number] Patient Account Number: [id number] Date of Birth/Sex: Treating RN: 1946-06-25 (77 y.o. [name] [name] Primary Care Hagen Bohorquez: [name] [name] Other Clinician: Referring Naim Murtha: Treating Bennette Hasty/Extender: [name] [name] in Treatment: 24 Vital Signs Time Taken: 08:27 Temperature (F): 97.6 [name], [name] [name] ([id number]) [id number] Page 8 of 8 Height (in): 69 Pulse (bpm): 105 Weight (lbs): 190 Respiratory Rate (breaths/min): 18 Body Mass Index (BMI): 28.1 Blood Pressure (mmHg): 133/70 Reference Range: 80 - 120 mg / dl Electronic Signature(s) Signed: 05/13/2023 11:19:47 AM By: [name] [name], BSN, RN, CWS, Kim RN, BSN Entered By: [name] [name], BSN, RN, CWS, Kim on 05/13/2023 [phone]

## 2023-05-13 NOTE — Progress Notes (Addendum)
[name], [name] ([id number]) [id number] Page 1 of 11 Visit Report for 05/13/2023 Chief Complaint Document Details Patient Name: Date of Service: [name], [name] 05/13/2023 8:15 A M Medical Record Number: [id number] Patient Account Number: [id number] Date of Birth/Sex: Treating RN: 03-17-1946 (77 y.o. Arthur Holms Primary Care Provider: [name] Other Clinician: Referring Provider: Treating Provider/Extender: [name] in Treatment: 24 Information Obtained from: Patient Chief Complaint Left heel pressure ulcer Electronic Signature(s) Signed: 05/13/2023 8:40:44 AM By: [name] [name] Entered By: [name] on 05/13/2023 08:40:44 -------------------------------------------------------------------------------- Debridement Details Patient Name: Date of Service: [name], [name] 05/13/2023 8:15 A M Medical Record Number: [id number] Patient Account Number: [id number] Date of Birth/Sex: Treating RN: November 24, 1945 (77 y.o. Arthur Holms Primary Care Provider: [name] Other Clinician: Referring Provider: Treating Provider/Extender: [name] in Treatment: 24 Debridement Performed for Assessment: Wound #5 Left Calcaneus Performed By: Physician [name], [name] Debridement Type: Debridement Level of Consciousness (Pre-procedure): Awake and Alert Pre-procedure Verification/Time Out Yes - 08:45 Taken: Percent of Wound Bed Debrided: 100% T Area Debrided (cm): otal 0.85 Tissue and other material debrided: Viable, Non-Viable, Slough, Subcutaneous, Slough, Hyper-granulation Level: Skin/Subcutaneous Tissue Debridement Description: Excisional Instrument: Curette Bleeding: Moderate Hemostasis Achieved: Silver Nitrate Response to Treatment: Procedure was tolerated well Level of Consciousness (Post- Awake and Alert procedure): Post Debridement Measurements of Total Wound Obey, Zaccary H  ([id number]) [id number] Page 2 of 11 Length: (cm) 0.9 Stage: Category/Stage III Width: (cm) 1.2 Depth: (cm) 0.9 Volume: (cm) 0.763 Character of Wound/Ulcer Post Debridement: Stable Post Procedure Diagnosis Same as Pre-procedure Electronic Signature(s) Signed: 05/13/2023 9:07:43 AM By: [name] [name] Signed: 05/13/2023 11:19:47 AM By: Elliot Gurney, BSN, RN, CWS, Kim RN, BSN Entered By: [name] on 05/13/2023 09:07:43 -------------------------------------------------------------------------------- HPI Details Patient Name: Date of Service: [name], [name] 05/13/2023 8:15 A M Medical Record Number: [id number] Patient Account Number: [id number] Date of Birth/Sex: Treating RN: [birth year] (78 y.o. Arthur Holms Primary Care Provider: [name] Other Clinician: Referring Provider: Treating Provider/Extender: [name] in Treatment: 24 History of Present Illness HPI Description: 77 year old male recently seen by his PCP Dr. [name] who saw him for a wound on the right leg which is less tender and he is continuing to use bacitracin and has completed her course of doxycycline. Past medical history of coronary artery disease, diabetes mellitus type 1, hyperlipidemia, hypertension, MI, plantar fascial fibromatosis, pneumonia, status post foot surgery due to trauma on the left side, coronary angioplasty. he is a former smoker and quit in July 1996. last hemoglobin A1c was 7.8 % in April of this year 5/22 2017 -- biopsy of the right lower extremity was sent for pathology and the report is that of a basal cell carcinoma with edges of the biopsy are involved. I have called the patient over the phone( 03/28/16) and discussed the pathology report with him and he is agreeable to see a surgeon for excision and need full. I have also spoken personally to [name], of [facility] and referred the patient for further wide excision of this  lesion and have communicated this to the patient. READMISSION 08/12/18 This is a now 77 year old man with type 1 diabetes. Hemoglobin A1c on 4/29 was 7.9. He is listed as having a history of PAD in the [place] records although the patient doesn't recall this, doesn't complain of any symptoms of claudication and doesn't believe he has had any prior noninvasive  arterial test. He is a nonsmoker. He was here in 2017 with a wound on his Right lower leg. This was biopsied in the clinic and pathology showed a basal cell carcinoma. He went on to have surgery on this area this is currently where I believe his current wounds are located. He generally bumped the lateral part of his right calf 2 weeks ago and has a superficial abrasion. He also has 2 small open areas on the medial part of the tibia in the same area. Our intake nurse noted a stitch coming out of this which was removed. These of the wound sees actually come to the clinic for. However the more concerning area is an area on the tip of the left great toe. He says that this was initially traumatic and he's been to see [name]. [name] been using what I think is Santyl to the wound tip. This has some depth. ABIs in this clinic were non-obtainable on the right and 1.84 on the left. 08/19/18; the patient had arterial studies that did not show evidence of significant hemodynamically compromising occlusions in either leg. There was mild medial atherosclerosis bilaterally. His resting ABIs were within the normal limits at 1.3 on the right and 1.4 on the left. There was normal posterior and anterior tibial artery waveforms and velocities listed on both sides. The patient arrives with what appears to be a healthy surface over the tip of his left great toe. This is indeed surprising. Still looks somewhat friable however. More concerning is the x-ray that we did that showed erosions of the tuft of the distal phalanx the left great toe  consistent with osteomyelitis. I attempted to pull this x-ray up on colon health Link however I can't open the film to look at this myself. 08/26/18; I reviewed the patient's x-ray and colon helpline. Indeed there is fairly obvious erosion of the tip of his left great toe. The wound was initially a traumatic area hitting it sometime in the middle of night in his kitchen. He is a type I diabetic. I have him on Flagyl and Levaquin that I started last week i.e. 7 days ago. He has an appointment with infectious disease on 09/01/18 09/02/18; the patient was seen by [name] of infectious disease. She did not feel the patient needed IV antibiotics. I do feel he needs further oral antibiotics however. He does not need anymore Flagyl but he does need another 2 weeks of Levaquin. The areas just about closed. 09/16/18; the patient is completing his Levaquin today. There is no open wound on the tip of the toe Readmission: MAXIMUM, MURNAN ([id number]) [id number] Page 3 of 11 11-26-2022 upon evaluation today patient presents for initial inspection here in our clinic concerning issues that he has been having after having been hospitalized due to septic arthritis. Subsequently he has a PICC line that is been removed and he has been discharged from infectious disease in that regard. Unfortunately he developed shearing wounds over the gluteal region as well as a pressure ulcer over each heel although they are not looking extremely bad nonetheless they do seem to be evidence of pressure which she sustained while hospitalized. Obviously he was very sick and is still recovering as far as that is concerned. Fortunately there does not appear to be any evidence of active infection systemically which is good news at this point according to Dr. [name]. Patient's wounds also do not appear to be too bad at this point which is good  news. Patient does have a history of type 1 diabetes  mellitus, peripheral vascular disease, coronary artery disease, congestive heart failure, hypertension, and he is on long-term anticoagulant therapy. 12-03-2022 upon evaluation today patient appears to be doing better to some degree in regard to his ulcers on his heels at this point. We are still waiting on the actual appointment with the vascular surgeons but nonetheless in the meantime I do believe that he is making some good progress currently with regard to loosen up the eschar. I did actually feel like that there was some ability for [place] to remove some of the necrotic tissue today and I discussed that with the patient. He is in agreement with that plan. We also can probably see about making an adjustment in the treatment regimen. 12-10-2022 upon evaluation today patient's wounds were really doing about the same. Again he is still awaiting the evaluation with the vascular surgeon. That should have already been done but had to be pushed back unfortunately. He will be seeing them next week on the seventh. I plan to see him following somewhere around the end of the week I think will probably be best. The patient voiced understanding. 12-19-2022 upon evaluation today patient appears to be doing well currently in regard to his wound we did get the results of his arterial studies which show that he has excellent ABIs with no signs of vascular compromise. For that reason I will go ahead and perform some debridement today to clearway the necrotic debris with his Eliquis that I will be too aggressive so this will still be a stepwise process but we are to go ahead and get that started. 12-27-2022 upon evaluation today patient appears to be doing well currently in regard to his wounds. They are actually showing signs of excellent improvement. There is some necrotic tissue noted on the surface of wounds to work on clearing some of that away today also I think that we may switch to [facility] dressing to see how  things do going forward. He is in agreement with that plan. Subsequently I am extremely pleased with where we stand currently. No fevers, chills, nausea, vomiting, or diarrhea. 01-02-2023 upon evaluation today patient appears to be doing much better in regard to his wounds the right foot appears to be almost healed the left foot though not completely healed is significantly better. Fortunately there does not appear to be any signs of active infection at this point. 01-10-2023 upon evaluation today patient appears to be doing well currently in regard to his wounds. The right heel actually is completely healed the left heel is definitely headed in the right direction and looks to be doing awesome. I do not see any signs of active infection locally nor systemically at this time which is great news. 3/7; his right heel has remained healed. There is no evidence of active infection. The remaining wound is on the lateral aspect of the heel just above the plantar surface 01-30-2023 upon evaluation today patient appears to be doing well currently in regard to his wound. The heel actually showing signs of improvement which is great news and fortunately I do not see any evidence of active infection locally nor systemically at this time. Overall I think that we are headed in the right direction which is excellent here. No fevers, chills, nausea, vomiting, or diarrhea. I am good have to perform some sharp debridement today. This is just the left heel that is remaining open. 02-06-2023 upon evaluation today  patient appears to be doing well currently in regard to his wound. Has been tolerating the dressing changes without complication. Fortunately there does not appear to be any signs of active infection locally nor systemically although he does have some need for sharp debridement with some definite necrotic tissue at the base of the wound today. 02-13-2023 upon evaluation today patient appears to be doing well currently  in regard to his wound which I feel like is slowly clearing up. I did review his growth labs as well the sed rate and C-reactive protein are down his white blood cell count was normal his hemoglobin is coming up. He did see Dr. Joylene Draft on Tuesday and she did continue him on the doxycycline considering his lab numbers but nonetheless he seems to be making some pretty good progress here in my opinion. The wound is slowly turnaround serial debridements are obviously helping with that being said I do believe that he is going to still require dressing changes and close monitoring. 02-20-2023 upon evaluation today patient's wound actually appears to be making some slight progress. This is slowly but surely moving into the right direction. Fortunately I do not see any signs of active infection at this time. 02-27-2023 upon evaluation today patient appears to be doing well currently in regard to his wound. He has been tolerating the dressing changes. I think the Hydrofera Blue is doing a good job and he seems to be doing well with that currently. 03-06-2023 upon evaluation today patient appears to be doing well currently in regard to his wounds were actually seeing signs of improvement which is great news. Fortunately I do not see any evidence of active infection locally nor systemically which is great news and overall I do believe that we are moving in the right direction. 03-13-2023 upon evaluation today patient's wound is actually showing signs of improvement. We actually do have a snap VAC with the bridge today and I am hopeful that this will actually be beneficial for him. Will give this a shot and see how things do. 03-17-2023 upon evaluation today patient comes in early due to having issues with the snap VAC will need to not maintain a seal. Subsequently we did end up seeing an and removing the snap VAC due to the fact that there is a lot of maceration and drainage around the wound itself. Upon removal  however the wound appears to be doing excellent and in fact was in much better shape that even when I saw her last Thursday. 03-20-2023 upon evaluation today patient appears to be doing well currently in regard to his wound is showing more signs of granulation which is great news and overall I am extremely pleased with where we stand today. I do not see any evidence of active infection locally nor systemically which is great news. No fevers, chills, nausea, vomiting, or diarrhea. 03-28-2023 upon evaluation today patient appears to be doing well currently in regard to his wound although he unfortunately is again macerated. I think a big portion of this is his leg swelling they were unable to really use the juxta lite appropriately over top of the snap VAC. For that reason his leg is extremely swollen as he has not really had any compression on over the past week. This obviously is not good and I discussed that with the patient today as well. I would recommend we take a break for a week yet again and I think that at this point we may need to  actually compression wrap him. If organ to continue with the snap VAC I think he needs to be underneath the compression wrap. 04-03-2023 upon evaluation today patient actually appears to be making good progress in my opinion in regard to his leg wound more specifically on the heel. He has been tolerating the dressing changes without complication and I actually feel like with the compression wrapping not only is his leg smaller but this looks much better compared to last week's evaluation. Fortunately I do not see any evidence of active infection locally nor systemically which is great news. 04-10-2023 upon evaluation today patient appears to be doing well currently in regard to his wound. He is actually tolerating the dressing changes without complication and very pleased in that regard. Fortunately I do not see any signs of active infection at this time. I do think organ  to try to go back to the snap VAC today with the compression wrap. 04-15-2023 upon evaluation today patient appears to be doing well currently in regard to his wound. The snap VAC seems to be doing an excellent job. I am actually extremely pleased with where we stand. 04-22-2023 upon evaluation today patient appears to be doing well currently in regard to his wound which is actually showing signs of improvement. Fortunately I AYOUB, DOCKETT ([id number]) [id number] Page 4 of 11 do not see any evidence of active infection locally nor systemically which is great news. No fevers, chills, nausea, vomiting, or diarrhea. 04-29-2023 upon evaluation today patient appears to be doing well currently in regard to his heel is showing signs of excellent improvement and actually very pleased with how things are doing with the snap VAC. 05-06-2023 upon evaluation today patient appears to be doing well with regard to his wound he is showing signs of improvement and very pleased in that regard. Fortunately I do not see any evidence of active infection locally or systemically which is great news and overall I am pleased with where things stand at this point. 05-13-2023 upon evaluation today patient appears to be doing well currently in regard to his wound although there is some hypergranulation. I do think that there may need to be some debridement here to clearway some hypergranular tissue I discussed that with the patient and he is in agreement with that plan. Subsequently I think this will allow for more appropriate healing and things to continue to move along. Electronic Signature(s) Signed: 05/13/2023 9:05:59 AM By: [name] [name] Entered By: [name] on 05/13/2023 09:05:59 -------------------------------------------------------------------------------- Physical Exam Details Patient Name: Date of Service: [name], [name] 05/13/2023 8:15 A M Medical Record Number:  [id number] Patient Account Number: [id number] Date of Birth/Sex: Treating RN: 08-02-1946 (77 y.o. Arthur Holms Primary Care Provider: [name] Other Clinician: Referring Provider: Treating Provider/Extender: [name] in Treatment: 24 Constitutional Well-nourished and well-hydrated in no acute distress. Respiratory normal breathing without difficulty. Psychiatric this patient is able to make decisions and demonstrates good insight into disease process. Alert and Oriented x 3. pleasant and cooperative. Notes Upon inspection I did perform debridement clearway some of the necrotic debris he tolerated that today without complication and postdebridement the wound bed actually appears to be doing significantly better which is good news. There was some bleeding I did use silver nitrate to control the bleeding for hemostasis purposes. Electronic Signature(s) Signed: 05/13/2023 9:06:16 AM By: [name] [name] Entered By: [name] on 05/13/2023 09:06:16 -------------------------------------------------------------------------------- Physician Orders Details Patient Name: Date of Service:  [name], [name] 05/13/2023 8:15 A M Medical Record Number: [id number] Patient Account Number: [id number] Date of Birth/Sex: Treating RN: Jun 29, 1946 (77 y.o. Chayten, Hovanec, Austell H ([id number]) [id number] Page 5 of 11 Primary Care Provider: [name] Other Clinician: Referring Provider: Treating Provider/Extender: [name] in Treatment: 24 Verbal / Phone Orders: No Diagnosis Coding ICD-10 Coding Code Description E10.621 Type 1 diabetes mellitus with foot ulcer I73.89 Other specified peripheral vascular diseases L89.623 Pressure ulcer of left heel, stage 3 L89.613 Pressure ulcer of right heel, stage 3 L98.412 Non-pressure chronic ulcer of buttock with fat layer exposed I25.10 Atherosclerotic heart  disease of native coronary artery without angina pectoris I50.42 Chronic combined systolic (congestive) and diastolic (congestive) heart failure I10 Essential (primary) hypertension Follow-up Appointments ppointment in 1 week. - Tuesday to check snap vac and re wrap Return A Nurse Visit as needed Bathing/ Shower/ Hygiene May shower with wound dressing protected with water repellent cover or cast protector. No tub bath. - no soaking foot in tub Anesthetic (Use 'Patient Medications' Section for Anesthetic Order Entry) Lidocaine applied to wound bed Edema Control - Lymphedema / Segmental Compressive Device / Other Patient to wear own compression stockings. Remove compression stockings every night before going to bed and put on every morning when getting up. - pt has juxtafit velcro wrap-ON HOLD AT THIS TIME Elevate, Exercise Daily and A void Standing for Long Periods of Time. Elevate legs to the level of the heart and pump ankles as often as possible Elevate leg(s) parallel to the floor when sitting. DO YOUR BEST to sleep in the bed at night. DO NOT sleep in your recliner. Long hours of sitting in a recliner leads to swelling of the legs and/or potential wounds on your backside. Negative Pressure Wound Therapy Snap Vac applied Wound VAC settings at continuous pressure. Use foam to wound cavity. Please order WHITE foam to fill any tunnel/s and/or undermining when necessary. Change VAC dressing 2 X WEEK. Change canister as indicated when full. Number of foam/gauze pieces used in the dressing = - 2, one wedge piece blue foam in wound bed, one foam piece in bridge. Wound Treatment Wound #5 - Calcaneus Wound Laterality: Left Cleanser: Byram Ancillary Kit - 15 Day Supply (Generic) 1 x Per Week/30 Days Discharge Instructions: Use supplies as instructed; Kit contains: (15) Saline Bullets; (15) 3x3 Gauze; 15 pr Gloves Cleanser: Vashe 5.8 (oz) 1 x Per Week/30 Days Discharge Instructions: Use  vashe 5.8 (oz) as directed Prim Dressing: Prisma 4.34 (in) 1 x Per Week/30 Days ary Discharge Instructions: packed dry in to deepest part of wound bed Compression Wrap: Urgo K2 Lite, two layer compression system, regular 1 x Per Week/30 Days Services and Therapies DME provider, dressing supplies - [employer]) Signed: 05/13/2023 11:19:47 AM By: Elliot Gurney, BSN, RN, CWS, Kim RN, BSN Signed: 05/13/2023 5:13:07 PM By: [name] [name] Entered By: Elliot Gurney, BSN, RN, CWS, Kim on 05/13/2023 08:49:06 Sweigert, [name] ([id number]) [id number] Page 6 of 11 -------------------------------------------------------------------------------- Problem List Details Patient Name: Date of Service: [name], [name] 05/13/2023 8:15 A M Medical Record Number: [id number] Patient Account Number: [id number] Date of Birth/Sex: Treating RN: 12-Oct-1946 (77 y.o. Arthur Holms Primary Care Provider: [name] Other Clinician: Referring Provider: Treating Provider/Extender: [name] in Treatment: 24 Active Problems ICD-10 Encounter Code Description Active Date MDM Diagnosis E10.621 Type 1 diabetes mellitus with foot ulcer 11/26/2022 No Yes I73.89 Other specified peripheral  vascular diseases 11/26/2022 No Yes L89.623 Pressure ulcer of left heel, stage 3 11/26/2022 No Yes L89.613 Pressure ulcer of right heel, stage 3 11/26/2022 No Yes L98.412 Non-pressure chronic ulcer of buttock with fat layer exposed 11/26/2022 No Yes I25.10 Atherosclerotic heart disease of native coronary artery without angina pectoris 11/26/2022 No Yes I50.42 Chronic combined systolic (congestive) and diastolic (congestive) heart failure 11/26/2022 No Yes I10 Essential (primary) hypertension 11/26/2022 No Yes Inactive Problems Resolved Problems Electronic Signature(s) Signed: 05/13/2023 8:40:38 AM By: [name] [name] Entered By: [name] on 05/13/2023  08:40:38 Chovanec, [name] ([id number]) [id number] Page 7 of 11 -------------------------------------------------------------------------------- Progress Note Details Patient Name: Date of Service: [name], [name] 05/13/2023 8:15 A M Medical Record Number: [id number] Patient Account Number: [id number] Date of Birth/Sex: Treating RN: 06-22-1946 (77 y.o. Arthur Holms Primary Care Provider: [name] Other Clinician: Referring Provider: Treating Provider/Extender: [name] in Treatment: 24 Subjective Chief Complaint Information obtained from Patient Left heel pressure ulcer History of Present Illness (HPI) 78 year old male recently seen by his PCP Dr. [name] who saw him for a wound on the right leg which is less tender and he is continuing to use bacitracin and has completed her course of doxycycline. Past medical history of coronary artery disease, diabetes mellitus type 1, hyperlipidemia, hypertension, MI, plantar fascial fibromatosis, pneumonia, status post foot surgery due to trauma on the left side, coronary angioplasty. he is a former smoker and quit in July 1996. last hemoglobin A1c was 7.8 % in April of this year 5/22 2017 -- biopsy of the right lower extremity was sent for pathology and the report is that of a basal cell carcinoma with edges of the biopsy are involved. I have called the patient over the phone( 03/28/16) and discussed the pathology report with him and he is agreeable to see a surgeon for excision and need full. I have also spoken personally to [name], of [facility] surgical and referred the patient for further wide excision of this lesion and have communicated this to the patient. READMISSION 08/12/18 This is a now 77 year old man with type 1 diabetes. Hemoglobin A1c on 4/29 was 7.9. He is listed as having a history of PAD in the [place] records although the patient doesn't recall this,  doesn't complain of any symptoms of claudication and doesn't believe he has had any prior noninvasive arterial test. He is a nonsmoker. He was here in 2017 with a wound on his Right lower leg. This was biopsied in the clinic and pathology showed a basal cell carcinoma. He went on to have surgery on this area this is currently where I believe his current wounds are located. He generally bumped the lateral part of his right calf 2 weeks ago and has a superficial abrasion. He also has 2 small open areas on the medial part of the tibia in the same area. Our intake nurse noted a stitch coming out of this which was removed. These of the wound sees actually come to the clinic for. However the more concerning area is an area on the tip of the left great toe. He says that this was initially traumatic and he's been to see [name]. [name] been using what I think is Santyl to the wound tip. This has some depth. ABIs in this clinic were non-obtainable on the right and 1.84 on the left. 08/19/18; the patient had arterial studies that did not show evidence of significant hemodynamically compromising occlusions in  either leg. There was mild medial atherosclerosis bilaterally. His resting ABIs were within the normal limits at 1.3 on the right and 1.4 on the left. There was normal posterior and anterior tibial artery waveforms and velocities listed on both sides. The patient arrives with what appears to be a healthy surface over the tip of his left great toe. This is indeed surprising. Still looks somewhat friable however. More concerning is the x-ray that we did that showed erosions of the tuft of the distal phalanx the left great toe consistent with osteomyelitis. I attempted to pull this x-ray up on colon health Link however I can't open the film to look at this myself. 08/26/18; I reviewed the patient's x-ray and colon helpline. Indeed there is fairly obvious erosion of the tip of his left great toe.  The wound was initially a traumatic area hitting it sometime in the middle of night in his kitchen. He is a type I diabetic. I have him on Flagyl and Levaquin that I started last week i.e. 7 days ago. He has an appointment with infectious disease on 09/01/18 09/02/18; the patient was seen by [name] of infectious disease. She did not feel the patient needed IV antibiotics. I do feel he needs further oral antibiotics however. He does not need anymore Flagyl but he does need another 2 weeks of Levaquin. The areas just about closed. 09/16/18; the patient is completing his Levaquin today. There is no open wound on the tip of the toe Readmission: 11-26-2022 upon evaluation today patient presents for initial inspection here in our clinic concerning issues that he has been having after having been hospitalized due to septic arthritis. Subsequently he has a PICC line that is been removed and he has been discharged from infectious disease in that regard. Unfortunately he developed shearing wounds over the gluteal region as well as a pressure ulcer over each heel although they are not looking extremely bad nonetheless they do seem to be evidence of pressure which she sustained while hospitalized. Obviously he was very sick and is still recovering as far as that is concerned. Fortunately there does not appear to be any evidence of active infection systemically which is good news at this point according to Dr. [name]. Patient's wounds also do not appear to be too bad at this point which is good news. Patient does have a history of type 1 diabetes mellitus, peripheral vascular disease, coronary artery disease, congestive heart failure, hypertension, and he is on long-term anticoagulant therapy. 12-03-2022 upon evaluation today patient appears to be doing better to some degree in regard to his ulcers on his heels at this point. We are still waiting on the actual appointment with the vascular surgeons but  nonetheless in the meantime I do believe that he is making some good progress currently with regard to loosen up the eschar. I did actually feel like that there was some ability for [place] to remove some of the necrotic tissue today and I discussed that with the patient. He is in agreement with that plan. We also can probably see about making an adjustment in the treatment regimen. 12-10-2022 upon evaluation today patient's wounds were really doing about the same. Again he is still awaiting the evaluation with the vascular surgeon. That DREYLON, LUSCHER ([id number]) [id number] Page 8 of 11 should have already been done but had to be pushed back unfortunately. He will be seeing them next week on the seventh. I plan to see him following somewhere around the  end of the week I think will probably be best. The patient voiced understanding. 12-19-2022 upon evaluation today patient appears to be doing well currently in regard to his wound we did get the results of his arterial studies which show that he has excellent ABIs with no signs of vascular compromise. For that reason I will go ahead and perform some debridement today to clearway the necrotic debris with his Eliquis that I will be too aggressive so this will still be a stepwise process but we are to go ahead and get that started. 12-27-2022 upon evaluation today patient appears to be doing well currently in regard to his wounds. They are actually showing signs of excellent improvement. There is some necrotic tissue noted on the surface of wounds to work on clearing some of that away today also I think that we may switch to [facility] dressing to see how things do going forward. He is in agreement with that plan. Subsequently I am extremely pleased with where we stand currently. No fevers, chills, nausea, vomiting, or diarrhea. 01-02-2023 upon evaluation today patient appears to be doing much better in regard to his wounds  the right foot appears to be almost healed the left foot though not completely healed is significantly better. Fortunately there does not appear to be any signs of active infection at this point. 01-10-2023 upon evaluation today patient appears to be doing well currently in regard to his wounds. The right heel actually is completely healed the left heel is definitely headed in the right direction and looks to be doing awesome. I do not see any signs of active infection locally nor systemically at this time which is great news. 3/7; his right heel has remained healed. There is no evidence of active infection. The remaining wound is on the lateral aspect of the heel just above the plantar surface 01-30-2023 upon evaluation today patient appears to be doing well currently in regard to his wound. The heel actually showing signs of improvement which is great news and fortunately I do not see any evidence of active infection locally nor systemically at this time. Overall I think that we are headed in the right direction which is excellent here. No fevers, chills, nausea, vomiting, or diarrhea. I am good have to perform some sharp debridement today. This is just the left heel that is remaining open. 02-06-2023 upon evaluation today patient appears to be doing well currently in regard to his wound. Has been tolerating the dressing changes without complication. Fortunately there does not appear to be any signs of active infection locally nor systemically although he does have some need for sharp debridement with some definite necrotic tissue at the base of the wound today. 02-13-2023 upon evaluation today patient appears to be doing well currently in regard to his wound which I feel like is slowly clearing up. I did review his growth labs as well the sed rate and C-reactive protein are down his white blood cell count was normal his hemoglobin is coming up. He did see Dr. Joylene Draft on Tuesday and she did continue  him on the doxycycline considering his lab numbers but nonetheless he seems to be making some pretty good progress here in my opinion. The wound is slowly turnaround serial debridements are obviously helping with that being said I do believe that he is going to still require dressing changes and close monitoring. 02-20-2023 upon evaluation today patient's wound actually appears to be making some slight progress. This is slowly but  surely moving into the right direction. Fortunately I do not see any signs of active infection at this time. 02-27-2023 upon evaluation today patient appears to be doing well currently in regard to his wound. He has been tolerating the dressing changes. I think the Hydrofera Blue is doing a good job and he seems to be doing well with that currently. 03-06-2023 upon evaluation today patient appears to be doing well currently in regard to his wounds were actually seeing signs of improvement which is great news. Fortunately I do not see any evidence of active infection locally nor systemically which is great news and overall I do believe that we are moving in the right direction. 03-13-2023 upon evaluation today patient's wound is actually showing signs of improvement. We actually do have a snap VAC with the bridge today and I am hopeful that this will actually be beneficial for him. Will give this a shot and see how things do. 03-17-2023 upon evaluation today patient comes in early due to having issues with the snap VAC will need to not maintain a seal. Subsequently we did end up seeing an and removing the snap VAC due to the fact that there is a lot of maceration and drainage around the wound itself. Upon removal however the wound appears to be doing excellent and in fact was in much better shape that even when I saw her last Thursday. 03-20-2023 upon evaluation today patient appears to be doing well currently in regard to his wound is showing more signs of granulation which is great  news and overall I am extremely pleased with where we stand today. I do not see any evidence of active infection locally nor systemically which is great news. No fevers, chills, nausea, vomiting, or diarrhea. 03-28-2023 upon evaluation today patient appears to be doing well currently in regard to his wound although he unfortunately is again macerated. I think a big portion of this is his leg swelling they were unable to really use the juxta lite appropriately over top of the snap VAC. For that reason his leg is extremely swollen as he has not really had any compression on over the past week. This obviously is not good and I discussed that with the patient today as well. I would recommend we take a break for a week yet again and I think that at this point we may need to actually compression wrap him. If organ to continue with the snap VAC I think he needs to be underneath the compression wrap. 04-03-2023 upon evaluation today patient actually appears to be making good progress in my opinion in regard to his leg wound more specifically on the heel. He has been tolerating the dressing changes without complication and I actually feel like with the compression wrapping not only is his leg smaller but this looks much better compared to last week's evaluation. Fortunately I do not see any evidence of active infection locally nor systemically which is great news. 04-10-2023 upon evaluation today patient appears to be doing well currently in regard to his wound. He is actually tolerating the dressing changes without complication and very pleased in that regard. Fortunately I do not see any signs of active infection at this time. I do think organ to try to go back to the snap VAC today with the compression wrap. 04-15-2023 upon evaluation today patient appears to be doing well currently in regard to his wound. The snap VAC seems to be doing an excellent job. I am actually  extremely pleased with where we  stand. 04-22-2023 upon evaluation today patient appears to be doing well currently in regard to his wound which is actually showing signs of improvement. Fortunately I do not see any evidence of active infection locally nor systemically which is great news. No fevers, chills, nausea, vomiting, or diarrhea. 04-29-2023 upon evaluation today patient appears to be doing well currently in regard to his heel is showing signs of excellent improvement and actually very pleased with how things are doing with the snap VAC. 05-06-2023 upon evaluation today patient appears to be doing well with regard to his wound he is showing signs of improvement and very pleased in that regard. Fortunately I do not see any evidence of active infection locally or systemically which is great news and overall I am pleased with where things stand at this point. 05-13-2023 upon evaluation today patient appears to be doing well currently in regard to his wound although there is some hypergranulation. I do think that there may need to be some debridement here to clearway some hypergranular tissue I discussed that with the patient and he is in agreement with that plan. Subsequently I think this will allow for more appropriate healing and things to continue to move along. TANISH, DURU ([id number]) [id number] Page 9 of 11 Objective Constitutional Well-nourished and well-hydrated in no acute distress. Vitals Time Taken: 8:27 AM, Height: 69 in, Weight: 190 lbs, BMI: 28.1, Temperature: 97.6 F, Pulse: 105 bpm, Respiratory Rate: 18 breaths/min, Blood Pressure: 133/70 mmHg. Respiratory normal breathing without difficulty. Psychiatric this patient is able to make decisions and demonstrates good insight into disease process. Alert and Oriented x 3. pleasant and cooperative. General Notes: Upon inspection I did perform debridement clearway some of the necrotic debris he tolerated that today without  complication and postdebridement the wound bed actually appears to be doing significantly better which is good news. There was some bleeding I did use silver nitrate to control the bleeding for hemostasis purposes. Integumentary (Hair, Skin) Wound #5 status is Open. Original cause of wound was Pressure Injury. The date acquired was: 10/20/2022. The wound has been in treatment 24 weeks. The wound is located on the Left Calcaneus. The wound measures 0.9cm length x 1.2cm width x 0.7cm depth; 0.848cm^2 area and 0.594cm^3 volume. There is Fat Layer (Subcutaneous Tissue) exposed. Tunneling has been noted at 7:00 with a maximum distance of 1.5cm. Undermining begins at 9:00 and ends at 12:00 with a maximum distance of 0.6cm. There is a medium amount of serosanguineous drainage noted. There is large (67-100%) red granulation within the wound bed. There is a small (1-33%) amount of necrotic tissue within the wound bed including Adherent Slough. 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 There was a Excisional Skin/Subcutaneous Tissue Debridement with a total area of 0.85 sq cm performed by [name], [name]. With the following instrument(s): Curette to remove Viable and Non-Viable tissue/material. Material removed includes Subcutaneous Tissue, Slough, and Hyper-granulation. No specimens were taken. A time out was conducted at 08:45, prior to the start of the procedure. A Moderate amount of  bleeding was controlled with Silver Nitrate. The procedure was tolerated well. Post Debridement Measurements: 0.9cm length x 1.2cm width x 0.9cm depth; 0.763cm^3 volume. Post debridement Stage noted as Category/Stage III. Character of Wound/Ulcer Post Debridement is stable. Post procedure Diagnosis Wound #5: Same as Pre-Procedure Plan Follow-up Appointments: Return Appointment in 1 week. - Tuesday to check snap vac and re wrap Nurse Visit as needed Bathing/ Shower/ Hygiene: May shower with wound dressing protected with water repellent cover or cast protector. No tub bath. - no soaking foot in tub Anesthetic (Use 'Patient Medications' Section for Anesthetic Order Entry): Lidocaine applied to wound bed Edema Control - Lymphedema / Segmental Compressive Device / Other: Patient to wear own compression stockings. Remove compression stockings every night before going to bed and put on every morning when getting up. - pt has juxtafit velcro wrap-ON HOLD AT THIS TIME Elevate, Exercise Daily and Avoid Standing for Long Periods of Time. Elevate legs to the level of the heart and pump ankles as often as possible Elevate leg(s) parallel to the floor when sitting. DO YOUR BEST to sleep in the bed at night. DO NOT sleep in your recliner. Long hours of sitting in a recliner leads to swelling of the legs and/or potential wounds on your backside. Negative Pressure Wound Therapy: TYRI, SCHMIDT ([id number]) [id number] Page 10 of 11 Snap Vac applied Wound VAC settings at continuous pressure. Use foam to wound cavity. Please order WHITE foam to fill any tunnel/s and/or undermining when necessary. Change VAC dressing 2 X WEEK. Change canister as indicated when full. Number of foam/gauze pieces used in the dressing = - 2, one wedge piece blue foam in wound bed, one foam piece in bridge. Services and Therapies ordered were: DME provider, dressing supplies - [employer]  VOB WOUND #5: - Calcaneus Wound Laterality: Left Cleanser: Byram Ancillary Kit - 15 Day Supply (Generic) 1 x Per Week/30 Days Discharge Instructions: Use supplies as instructed; Kit contains: (15) Saline Bullets; (15) 3x3 Gauze; 15 pr Gloves Cleanser: Vashe 5.8 (oz) 1 x Per Week/30 Days Discharge Instructions: Use vashe 5.8 (oz) as directed Prim Dressing: Prisma 4.34 (in) 1 x Per Week/30 Days ary Discharge Instructions: packed dry in to deepest part of wound bed Com pression Wrap: Urgo K2 Lite, two layer compression system, regular 1 x Per Week/30 Days 1. I would recommend based on what we are seeing that we have the patient continue to monitor for any evidence of infection or worsening. I do believe that he is doing really well at this time and to be should be honest I think that he is tolerating the dressing changes with the snap VAC without complication. I do think he could benefit potentially from [facility] to see if we can get this healed much more quickly that could even be used in combination with the wound VAC if necessary. 2. I am good recommend as well for now use low bit of collagen in the base of the wound followed by the snap VAC. We will see patient back for reevaluation in 1 week here in the clinic. If anything worsens or changes patient will contact our office for additional recommendations. Electronic Signature(s) Signed: 05/13/2023 9:07:57 AM By: [name] [name] Previous Signature: 05/13/2023 9:07:09 AM Version By: [name] [name] Entered By: [name] on 05/13/2023 09:07:57 -------------------------------------------------------------------------------- SuperBill Details Patient Name: Date of Service: [name], [name] 05/13/2023 Medical Record Number: [id number] Patient Account Number: [id number] Date of Birth/Sex: Treating RN: 1946/05/21 (76 y.o.  Loel Lofty, Selena Batten Primary Care Provider: [name] Other Clinician: Referring Provider: Treating Provider/Extender:  [name] in Treatment: 24 Diagnosis Coding ICD-10 Codes Code Description [phone] Type 1 diabetes mellitus with foot ulcer I73.89 Other specified peripheral vascular diseases L89.623 Pressure ulcer of left heel, stage 3 L89.613 Pressure ulcer of right heel, stage 3 L98.412 Non-pressure chronic ulcer of buttock with fat layer exposed I25.10 Atherosclerotic heart disease of native coronary artery without angina pectoris I50.42 Chronic combined systolic (congestive) and diastolic (congestive) heart failure I10 Essential (primary) hypertension Facility Procedures : CPT4 Code: [id number] Description: 11042 - DEB SUBQ TISSUE 20 SQ CM/< ICD-10 Diagnosis Description L89.623 Pressure ulcer of left heel, stage 3 Modifier: Quantity: 1 Physician Procedures MARKIS, HOWERY ([id number]): CPT4 Code Description [id number] 11042 - WC PHYS SUBQ TISS 20 SQ CM ICD-10 Diagnosis Description L89.623 Pressure ulcer of left heel, stage 3 [id number] Page 11 of 11: Quantity Modifier 1 Electronic Signature(s) Signed: 05/13/2023 9:08:16 AM By: [name] [name] Entered By: [name] on 05/13/2023 09:08:16

## 2023-05-14 ENCOUNTER — Other Ambulatory Visit
Admission: RE | Admit: 2023-05-14 | Discharge: 2023-05-14 | Disposition: A | Discharge status: Home / Self Care | Payer: Medicare HMO | Source: Ambulatory Visit | Attending: *Deleted | Admitting: *Deleted

## 2023-05-14 DIAGNOSIS — M5451 Vertebrogenic low back pain: Secondary | ICD-10-CM | POA: Diagnosis not present

## 2023-05-14 DIAGNOSIS — M4640 Discitis, unspecified, site unspecified: Secondary | ICD-10-CM | POA: Insufficient documentation

## 2023-05-14 DIAGNOSIS — C679 Malignant neoplasm of bladder, unspecified: Secondary | ICD-10-CM | POA: Diagnosis not present

## 2023-05-14 LAB — CBC WITH DIFFERENTIAL/PLATELET
Abs Immature Granulocytes: 0.06 10*3/uL (ref 0.00–0.07)
Basophils Absolute: 0.1 10*3/uL (ref 0.0–0.1)
Basophils Relative: 1 %
Eosinophils Absolute: 0 10*3/uL (ref 0.0–0.5)
Eosinophils Relative: 0 %
HCT: 31.8 % — ABNORMAL LOW (ref 39.0–52.0)
Hemoglobin: 10.2 g/dL — ABNORMAL LOW (ref 13.0–17.0)
Immature Granulocytes: 1 %
Lymphocytes Relative: 11 %
Lymphs Abs: 1.2 10*3/uL (ref 0.7–4.0)
MCH: 31.2 pg (ref 26.0–34.0)
MCHC: 32.1 g/dL (ref 30.0–36.0)
MCV: 97.2 fL (ref 80.0–100.0)
Monocytes Absolute: 1.2 10*3/uL — ABNORMAL HIGH (ref 0.1–1.0)
Monocytes Relative: 11 %
Neutro Abs: 8 10*3/uL — ABNORMAL HIGH (ref 1.7–7.7)
Neutrophils Relative %: 76 %
Platelets: 291 10*3/uL (ref 150–400)
RBC: 3.27 MIL/uL — ABNORMAL LOW (ref 4.22–5.81)
RDW: 17.1 % — ABNORMAL HIGH (ref 11.5–15.5)
WBC: 10.5 10*3/uL (ref 4.0–10.5)
nRBC: 0 % (ref 0.0–0.2)

## 2023-05-14 LAB — C-REACTIVE PROTEIN: CRP: 12.3 mg/dL — ABNORMAL HIGH (ref <1.0)

## 2023-05-14 LAB — SEDIMENTATION RATE: Sed Rate: 8 mm/hr (ref 0–20)

## 2023-05-19 NOTE — Progress Notes (Unsigned)
Patient ID: [name], male   DOB: [birth year], 77 y.o.   MRN: [id number] Cardiology Office Note  Date:  05/20/2023   ID:  [name], DOB [birth year], MRN [id number]  PCP:  [name] Mina, MD   Chief Complaint  Patient presents with   3 month follow up     "Doing well." Medications reviewed by the patient verbally.     HPI:  Mr. [name] is a very pleasant 77 year old gentleman with a history of coronary artery disease,  occluded LAD with stent placed February 2008 ( Cypher 3.0 x 8 mm DES stent),   previous angina was described as posterior neck pain.    diabetes type II with complications hypertension  Former smoker, quit late 30s bladder cancer,08/2017 finished chemo  Lymphedema lower extremities who presents for routine followup of his coronary artery disease, DVT November 2023  Last seen in clinic by myself  March 2024  Seen in the emergency room May 05, 2019 for hyperglycemia Insulin pump malfunction, sugars running high, started developing lightheadedness and nausea despite giving himself insulin Heart rate 120 bpm  Biggest issue is severe back pain from discitis Followed by neurosurgery and ID Gets lightheaded sometimes when standing up, thinks it is from extreme pain Blood pressure has been running low, taking midodrine 5 mg in the morning alone  On Entresto 24/26 twice daily with metoprolol succinate 12.5 daily  Continued lower extremity edema, wears compression hose Presents today in a wheelchair given his severe back pain  EKG personally reviewed by myself on todays visit EKG Interpretation Date/Time:  Tuesday May 20 2023 11:43:57 EDT Ventricular Rate:  86 PR Interval:  132 QRS Duration:  84 QT Interval:  368 QTC Calculation: 440 R Axis:   6  Text Interpretation: Normal sinus rhythm Septal infarct (cited on or before 25-Mar-2022) When compared with ECG of 05-May-2023 07:42, Questionable change in initial forces of Septal leads  Confirmed by [name] [phone]) on 05/20/2023 12:04:51 PM    Hospitalized between 10/02/22-10/14/22 and underwent washout and debridement X2 and was discharged on Iv daptomycin to SNF.   purulent drainage from the surgical sites noted by orthopedics in follow-up -readmitted to the hospital on 10/21/22 and underwent arthroscopic washout of the left knee on the same day. Culture of the synovial fluid was  MRSA 11/22 washout MRSA- MIC vanco 1 10/07/22 washout MRSA 10/21/22 washout MRSA-- the vancomycin MIC had increased to 2 from 1 before .  Added Ceftaroline  as dual MRSA coverage on 10/25/22 Daptomycin MIC was sent to [facility] and reported as 3 which was resistant. Ceftaroline MIC was susceptible at < 1 DC dapto and started vanco on 11/01/22 along with ceftaroline Unable to use rifampin instead because he was on eliquis 4- weeks of IV antibiotic from starting ceftaroline ( 12/15) - until 11/25/22-  Echocardiogram October 05, 2022 ejection fraction estimated 35 to 40% TEE 11/23 Left ventricular ejection fraction, by estimation, is 35 to 40%.,  No endocarditis  October 03, 2022 nonocclusive thrombus seen in the left calf peroneal vein November 14, 2022 Short segment occlusive superficial thrombophlebitis involving mid humeral aspect of basilic vein right upper extremity (picc)  Presents today in a wheelchair, has appreciated leg weakness, difficulty walking, leg swelling Some abdominal fullness Currently not on Lasix Blood loss anemia, hemoglobin into the 7 range now trending back upwards, greater than 10  Entresto, carvedilol started in the hospital in the setting of depressed ejection fraction, these were held for hypotension Was discharged on  midodrine 10 twice daily  Reports blood pressure 1 30-1 40 systolic, with movement sometimes up to 170 systolic  Past medical history completed treatments for bladder cancer,   Uses insulin pump Followed by endocrine   previous angina was  described as posterior neck pain.   On previous visits, he reported having night sweats.  We held his carvedilol and Lipitor. Night sweats did not improve. told he had infection in his gums and has had significant work done.  night sweats have improved       Cardiac catheter report from 2008 details 25% ostial left main, 100% mid LAD with stent placed at this location,40% proximal circumflex, 20% PL vessel disease Previous stress test showing scar in the mid to distal anterior wall. Previous lab work; Hemoglobin A1c 8.2, total cholesterol 134, LDL 65  PMH:   has a past medical history of Arthritis, Back pain, Chronic constipation, Coronary artery disease, Edema of both lower extremities, GERD (gastroesophageal reflux disease), History of bladder cancer (2018), History of diabetic ketoacidosis, History of DVT of lower extremity, History of kidney stones, History of MI (myocardial infarction) (12/2006), History of nonmelanoma skin cancer, Hyperlipidemia, Hypertension, Insulin dependent type 1 diabetes mellitus ([name]), Insulin pump in place, Ischemic cardiomyopathy (12/2006), Nonproliferative retinopathy due to secondary diabetes [facility]), Peripheral vascular disease ([name]), Rupture of flexor tendon of finger, S/P drug eluting coronary stent placement (12/17/2006), and Urethral stricture in male.  PSH:    Past Surgical History:  Procedure Laterality Date   CATARACT EXTRACTION W/PHACO Left 04/18/2020   Procedure: CATARACT EXTRACTION PHACO AND INTRAOCULAR LENS PLACEMENT (IOC) LEFT DIABETIC 14.60  01:57.1;  Surgeon: Galen Manila, MD;  Location: [facility];  Service: Ophthalmology;  Laterality: Left;  Diabetic - insulin pump   CATARACT EXTRACTION W/PHACO Right 10/10/2020   Procedure: CATARACT EXTRACTION PHACO AND INTRAOCULAR LENS PLACEMENT (IOC) RIGHT DIABETIC 12.59 01:44.7;  Surgeon: Galen Manila, MD;  Location: [facility];  Service: Ophthalmology;  Laterality: Right;  Diabetic - insulin pump    COLONOSCOPY WITH PROPOFOL N/A 02/18/2018   Procedure: COLONOSCOPY WITH PROPOFOL;  Surgeon: Toledo, Boykin Nearing, MD;  Location: [facility];  Service: Gastroenterology;  Laterality: N/A;   CORONARY ANGIOPLASTY WITH STENT PLACEMENT  12/17/2006   @[facility] ;   MI---  PCI w/ DES x2 to occluded LAD, ef 30%  (scanned in epic, under media)   CYSTOSCOPY WITH BIOPSY N/A 10/21/2017   Procedure: CYSTOSCOPY WITH BLADDER BIOPSY;  Surgeon: Orson Ape, MD;  Location: [facility];  Service: Urology;  Laterality: N/A;   CYSTOSCOPY WITH BIOPSY N/A 12/08/2018   Procedure: CYSTOSCOPY WITH BLADDER BIOPSY-MITOMYCIN;  Surgeon: Orson Ape, MD;  Location: [facility];  Service: Urology;  Laterality: N/A;   CYSTOSCOPY WITH DIRECT VISION INTERNAL URETHROTOMY  03/02/2020   Procedure: CYSTOSCOPY WITH DIRECT VISION INTERNAL URETHROTOMY;  Surgeon: Orson Ape, MD;  Location: [facility];  Service: Urology;;   CYSTOSCOPY WITH DIRECT VISION INTERNAL URETHROTOMY N/A 09/14/2020   Procedure: CYSTOSCOPY WITH DIRECT VISION INTERNAL URETHROTOMY WITH HOLM LASER;  Surgeon: Orson Ape, MD;  Location: [facility];  Service: Urology;  Laterality: N/A;   CYSTOSCOPY WITH DIRECT VISION INTERNAL URETHROTOMY N/A 11/29/2021   Procedure: CYSTOSCOPY WITH DIRECT VISION INTERNAL URETHROTOMY OPTILUME;  Surgeon: Orson Ape, MD;  Location: [facility];  Service: Urology;  Laterality: N/A;   CYSTOSCOPY WITH HOLMIUM LASER LITHOTRIPSY N/A 05/08/2020   Procedure: CYSTOSCOPY WITH HOLMIUM LASER LITHOTRIPSY;  Surgeon: Orson Ape, MD;  Location: [facility];  Service: Urology;  Laterality:  N/A;   EXCISION MASS LOWER EXTREMETIES Right 06/05/2016   Procedure: EXCISION OF LESION RIGHT LOWER LEG;  Surgeon: Gladis Riffle, MD;  Location: [facility];  Service: General;  Laterality: Right;   FLEXOR TENDON REPAIR Left 08/06/2022   Procedure: Left middle finger flexor tendon reconstruction stage 1;  Surgeon: Gomez Cleverly, MD;  [facility];  Service: Orthopedics;  Laterality: Left;   GREEN LIGHT LASER TURP (TRANSURETHRAL RESECTION OF PROSTATE N/A 08/12/2019   Procedure: GREEN LIGHT LASER TURP (TRANSURETHRAL RESECTION OF PROSTATE, BLADDER BIOPSY;  Surgeon: Orson Ape, MD;  Location: [facility];  Service: Urology;  Laterality: N/A;   HOLMIUM LASER APPLICATION  03/02/2020   Procedure: HOLMIUM LASER APPLICATION;  Surgeon: Orson Ape, MD;  Location: [facility];  Service: Urology;;  Urethral stone    INCISION AND DRAINAGE Left 10/07/2022   Procedure: ARTHROSCOPIC INCISION AND DRAINAGE;  Surgeon: Christena Flake, MD;  Location: [facility];  Service: Orthopedics;  Laterality: Left;   KNEE ARTHROSCOPY Left 10/03/2022   Procedure: EXTENSIVE ARTHROSCOPIC DEBRIDMENT WITH PARTIAL MENISECTOMY;  Surgeon: Christena Flake, MD;  Location: [facility];  Service: Orthopedics;  Laterality: Left;   KNEE ARTHROSCOPY Left 10/21/2022   Procedure: ARTHROSCOPIC INCISION AND DRAINAGE KNEE-LEFT;  Surgeon: Christena Flake, MD;  Location: [facility];  Service: Orthopedics;  Laterality: Left;   SKIN CANCER EXCISION     chest   TEE WITHOUT CARDIOVERSION N/A 10/07/2022   Procedure: TRANSESOPHAGEAL ECHOCARDIOGRAM (TEE);  Surgeon: Debbe Odea, MD;  Location: [facility];  Service: Cardiovascular;  Laterality: N/A;   TRANSURETHRAL RESECTION OF BLADDER TUMOR N/A 07/15/2017   Procedure: TRANSURETHRAL RESECTION OF BLADDER TUMOR (TURBT);  Surgeon: Orson Ape, MD;  Location: [facility];  Service: Urology;  Laterality: N/A;   URETERAL BIOPSY N/A 08/12/2019   Procedure: URETERAL BIOPSY;  Surgeon: Orson Ape, MD;  Location: [facility];  Service: Urology;  Laterality: N/A;   URETHROTOMY N/A 05/08/2020   Procedure: CYSTOSCOPY/URETHROTOMY;  Surgeon: Orson Ape, MD;  Location: [facility];  Service: Urology;  Laterality: N/A;    Current Outpatient Medications  Medication Sig Dispense Refill   aspirin EC 81 MG tablet Take 81 mg by mouth daily.      atorvastatin (LIPITOR) 40 MG tablet TAKE 1 TABLET(40 MG) BY MOUTH DAILY 90 tablet 0   docusate sodium (COLACE) 100 MG capsule Take 2 capsules (200 mg total) by mouth 2 (two) times daily. (Patient taking differently: Take 100 mg by mouth daily as needed for moderate constipation.) 120 capsule 3   doxycycline (ADOXA) 100 MG tablet Take 1 tablet (100 mg total) by mouth 2 (two) times daily. 60 tablet 2   ENTRESTO 24-26 MG TAKE 1 TABLET BY MOUTH TWICE DAILY 60 tablet 3   furosemide (LASIX) 20 MG tablet Take 1 tablet (20 mg total) by mouth every other day. 15 tablet 2   glucagon 1 MG injection Inject 1 mg into the skin once as needed (for low blood sugar).     insulin aspart (NOVOLOG) 100 UNIT/ML injection Inject 6 Units into the skin 3 (three) times daily with meals. 10 mL 11   insulin aspart (NOVOLOG) 100 UNIT/ML injection Inject 0-20 Units into the skin 3 (three) times daily with meals. CBG < 70:Implement Hypoglycemia Standing Orders and refer to Hypoglycemia Standing Orders sidebar report CBG 70 - 120: 0 units  CBG 121 - 150: 3 units  CBG 151 - 200: 4 units  CBG 201 - 250: 7 units  CBG 251 - 300: 11 units  CBG 301 - 350: 15 units  CBG 351 - 400: 20 units  CBG > 400 call MD and obtain STAT lab verification 10 mL 11   insulin glargine (LANTUS) 100 UNIT/ML injection Inject 8 Units into the skin daily.     Meth-Hyo-M Bl-Na Phos-Ph Sal (URIBEL) 118 MG CAPS Take 1 capsule (118 mg total) by mouth every 6 (six) hours as needed (dysuria). 40 capsule 3   metoprolol succinate (TOPROL XL) 25 MG 24 hr tablet Take 0.5 tablets (12.5 mg total) by mouth daily. 45 tablet 3   midodrine (PROAMATINE) 10 MG tablet Take 1 tablet (10 mg total) by mouth 3 (three) times daily as needed. 30 tablet 3   Multiple Vitamin (MULTIVITAMIN WITH MINERALS) TABS tablet Take 1 tablet by mouth daily.     omeprazole (PRILOSEC) 20 MG capsule Take 1 capsule (20 mg total) by mouth daily. 30 capsule 0   potassium chloride SA (KLOR-CON M) 20  MEQ tablet TAKE 1 TABLET(20 MEQ) BY MOUTH DAILY WITH FUROSEMIDE. LASIX AS NEEDED 90 tablet 0   traMADol (ULTRAM) 50 MG tablet Take 50 mg by mouth every 6 (six) hours as needed.     vitamin B-12 (CYANOCOBALAMIN) 1000 MCG tablet Take 1,000 mcg by mouth every other day.     Blood Glucose Monitoring Suppl (ACCU-CHEK GUIDE ME) w/Device KIT See admin instructions. (Patient not taking: Reported on 05/20/2023)     Cysteamine Bitartrate (PROCYSBI) 300 MG PACK Use 1 each 4 (four) times daily Use as instructed. (Patient not taking: Reported on 05/20/2023)     Hyoscyamine Sulfate SL (LEVSIN/SL) 0.125 MG SUBL Place 0.125-0.25 mg under the tongue every 4 (four) hours as needed (bladder spasms). 1-2 TABS (Patient not taking: Reported on 05/20/2023) 40 tablet 3   naproxen sodium (ALEVE) 220 MG tablet Take 220-440 mg by mouth 2 (two) times daily as needed (pain/headaches.). (Patient not taking: Reported on 05/20/2023)     ondansetron (ZOFRAN-ODT) 4 MG disintegrating tablet Take 1 tablet (4 mg total) by mouth every 8 (eight) hours as needed for nausea or vomiting. (Patient not taking: Reported on 05/20/2023) 20 tablet 0   No current facility-administered medications for this visit.     Allergies:   Ceftaroline   Social History:  The patient  reports that he quit smoking about 27 years ago. His smoking use included cigarettes. He has a 10.00 pack-year smoking history. He has never used smokeless tobacco. He reports that he does not drink alcohol and does not use drugs.   Family History:   family history includes Heart attack in his mother; Heart disease in his father and mother; Prostate cancer in his brother.   Review of Systems: Review of Systems  Constitutional: Negative.   Respiratory: Negative.    Cardiovascular: Negative.   Gastrointestinal: Negative.   Musculoskeletal: Negative.   Neurological: Negative.   Psychiatric/Behavioral: Negative.    All other systems reviewed and are negative.   PHYSICAL  EXAM: VS:  BP (!) 100/58 (BP Location: Left Arm, Patient Position: Sitting, Cuff Size: Normal)   Pulse 86   Ht 5\' 9"  (1.753 m)   Wt 183 lb (83 kg)   SpO2 92%   BMI 27.02 kg/m  , BMI Body mass index is 27.02 kg/m. Constitutional:  oriented to person, place, and time. No distress.  HENT:  Head: Grossly normal Eyes:  no discharge. No scleral icterus.  Neck: No JVD, no carotid bruits  Cardiovascular: Regular rate and rhythm,  no murmurs appreciated Pulmonary/Chest: Clear to auscultation bilaterally, no wheezes or rails Abdominal: Soft.  no distension.  no tenderness.  Musculoskeletal: Normal range of motion Neurological:  normal muscle tone. Coordination normal. No atrophy Skin: Skin warm and dry Psychiatric: normal affect, pleasant  Recent Labs: 10/26/2022: B Natriuretic Peptide 542.4 11/07/2022: Magnesium 1.9 02/11/2023: ALT 15 05/05/2023: BUN 38; Creatinine, Ser 1.40; Potassium 4.8; Sodium 137 05/14/2023: Hemoglobin 10.2; Platelets 291    Lipid Panel Lab Results  Component Value Date   CHOL 101 06/08/2018   HDL 57 06/08/2018   LDLCALC 37 06/08/2018   TRIG 34 06/08/2018      Wt Readings from Last 3 Encounters:  05/20/23 183 lb (83 kg)  05/05/23 170 lb (77.1 kg)  02/11/23 177 lb (80.3 kg)     ASSESSMENT AND PLAN:  Atherosclerosis of native coronary artery of native heart without angina pectoris -  Known CAD,  stent x2  in the LAD, in 2008  mild circumflex and RCA calcification Low ejection fraction, 35 to 40%, previously declined cardiac catheterization Continue Entresto at current dose, metoprolol succinate Given low blood pressure, no room to add spironolactone Will defer Jardiance/Farxiga to endocrine Will continue Lasix every other day Leg edema likely predominantly secondary to lymphedema, wears compression hose  Septic joint Treated for MRSA following joint replacement working on rehab Followed by ID  Back pain Followed by neurosurgery, reported to have  discitis Severe pain on standing  HYPERTENSION, BENIGN - Plan: EKG 12-Lead Blood pressure low, using midodrine 5 in the morning Recommend close monitoring of blood pressure later in the day, for orthostasis would increase midodrine up to 5 mg 3 times daily May need midodrine 10 mg periodically for orthostasis symptoms If possible we will try to continue Entresto and metoprolol succinate given cardiomyopathy though dose reduction may be needed for persistent orthostasis  Cardiomyopathy Will continue Entresto low-dose, metoprolol succinate as above Lasix 20 every other day with potassium every other day Compression hose for lymphedema  Hyperlipidemia Continue Lipitor, goal LDL less than 70  Anemia Anemia after knee surgery, hemoglobin around 10,  Remains low but stable  Controlled type 2 diabetes mellitus with other circulatory complication, without long-term current use of insulin ([name]) Followed by endocrinology recent issue with his pump, in the hospital DKA, dehydration creatinine up to 1.4 Was hydrated in the ER  Lymphedema Wears compressions, leg elevation Avoid calcium channel blockers    Total encounter time more than 40 minutes  Greater than 50% was spent in counseling and coordination of care with the patient   Signed, Dossie Arbour, M.D., Ph.D. 05/20/2023  [facility] [place], [place] [phone]

## 2023-05-20 ENCOUNTER — Encounter: Payer: Self-pay | Admitting: Cardiovascular Disease

## 2023-05-20 ENCOUNTER — Encounter: Payer: Medicare HMO | Admitting: Physician Assistant

## 2023-05-20 ENCOUNTER — Ambulatory Visit: Payer: Medicare HMO | Attending: Cardiovascular Disease | Admitting: Cardiovascular Disease

## 2023-05-20 VITALS — BP 100/58 | HR 86 | Ht 69.0 in | Wt 183.0 lb

## 2023-05-20 DIAGNOSIS — E1159 Type 2 diabetes mellitus with other circulatory complications: Secondary | ICD-10-CM | POA: Diagnosis not present

## 2023-05-20 DIAGNOSIS — L89623 Pressure ulcer of left heel, stage 3: Secondary | ICD-10-CM | POA: Diagnosis not present

## 2023-05-20 DIAGNOSIS — I951 Orthostatic hypotension: Secondary | ICD-10-CM

## 2023-05-20 DIAGNOSIS — L97422 Non-pressure chronic ulcer of left heel and midfoot with fat layer exposed: Secondary | ICD-10-CM | POA: Diagnosis not present

## 2023-05-20 DIAGNOSIS — I25118 Atherosclerotic heart disease of native coronary artery with other forms of angina pectoris: Secondary | ICD-10-CM | POA: Diagnosis not present

## 2023-05-20 DIAGNOSIS — Z85828 Personal history of other malignant neoplasm of skin: Secondary | ICD-10-CM | POA: Diagnosis not present

## 2023-05-20 DIAGNOSIS — Z87891 Personal history of nicotine dependence: Secondary | ICD-10-CM | POA: Diagnosis not present

## 2023-05-20 DIAGNOSIS — R600 Localized edema: Secondary | ICD-10-CM | POA: Diagnosis not present

## 2023-05-20 DIAGNOSIS — I5042 Chronic combined systolic (congestive) and diastolic (congestive) heart failure: Secondary | ICD-10-CM | POA: Diagnosis not present

## 2023-05-20 DIAGNOSIS — L89613 Pressure ulcer of right heel, stage 3: Secondary | ICD-10-CM | POA: Diagnosis not present

## 2023-05-20 DIAGNOSIS — I420 Dilated cardiomyopathy: Secondary | ICD-10-CM

## 2023-05-20 DIAGNOSIS — E10621 Type 1 diabetes mellitus with foot ulcer: Secondary | ICD-10-CM | POA: Diagnosis not present

## 2023-05-20 DIAGNOSIS — L98412 Non-pressure chronic ulcer of buttock with fat layer exposed: Secondary | ICD-10-CM | POA: Diagnosis not present

## 2023-05-20 DIAGNOSIS — I110 Hypertensive heart disease with heart failure: Secondary | ICD-10-CM | POA: Diagnosis not present

## 2023-05-20 DIAGNOSIS — E782 Mixed hyperlipidemia: Secondary | ICD-10-CM

## 2023-05-20 DIAGNOSIS — I10 Essential (primary) hypertension: Secondary | ICD-10-CM

## 2023-05-20 DIAGNOSIS — I2510 Atherosclerotic heart disease of native coronary artery without angina pectoris: Secondary | ICD-10-CM | POA: Diagnosis not present

## 2023-05-20 DIAGNOSIS — Z794 Long term (current) use of insulin: Secondary | ICD-10-CM | POA: Diagnosis not present

## 2023-05-20 NOTE — Patient Instructions (Signed)
Medication Instructions:  No changes  If you need a refill on your cardiac medications before your next appointment, please call your pharmacy.    Lab work: No new labs needed   Testing/Procedures: No new testing needed   Follow-Up: At [facility], you and your health needs are our priority.  As part of our continuing mission to provide you with exceptional heart care, we have created designated Provider Care Teams.  These Care Teams include your primary Cardiologist (physician) and Advanced Practice Providers (APPs -  Physician Assistants and Nurse Practitioners) who all work together to provide you with the care you need, when you need it.  You will need a follow up appointment in 6 months  Providers on your designated Care Team:   Christopher Berge, NP Ryan Dunn, [name] Cadence Furth, [name]  COVID-19 Vaccine Information can be found at: [email or web address] For questions related to vaccine distribution or appointments, please email [email or web address] or call [phone].   

## 2023-05-20 NOTE — Progress Notes (Signed)
JAMMY, PLOTKIN ([id number]) [id number] Page 1 of 3 Visit Report for 05/20/2023 Chief Complaint Document Details Patient Name: Date of Service: [name], [name] 05/20/2023 2:00 PM Medical Record Number: [id number] Patient Account Number: [id number] Date of Birth/Sex: Treating RN: April 29, 1946 (77 y.o. [name] Primary Care Provider: [name] Other Clinician: Referring Provider: Treating Provider/Extender: [name] in Treatment: 25 Information Obtained from: Patient Chief Complaint Left heel pressure ulcer Electronic Signature(s) Signed: 05/20/2023 2:12:07 PM By: [name] [name] Entered By: [name] on 05/20/2023 14:12:07 -------------------------------------------------------------------------------- Physician Orders Details Patient Name: Date of Service: [name], [name] 05/20/2023 2:00 PM Medical Record Number: [id number] Patient Account Number: [id number] Date of Birth/Sex: Treating RN: June 20, 1946 (77 y.o. [name] Primary Care Provider: [name] Other Clinician: Referring Provider: Treating Provider/Extender: [name] in Treatment: 25 Verbal / Phone Orders: No Diagnosis Coding ICD-10 Coding Code Description E10.621 Type 1 diabetes mellitus with foot ulcer I73.89 Other specified peripheral vascular diseases L89.623 Pressure ulcer of left heel, stage 3 L89.613 Pressure ulcer of right heel, stage 3 L98.412 Non-pressure chronic ulcer of buttock with fat layer exposed I25.10 Atherosclerotic heart disease of native coronary artery without angina pectoris I50.42 Chronic combined systolic (congestive) and diastolic (congestive) heart failure I10 Essential (primary) hypertension Hedlund, Morad H ([id number]) [id number] Page 2 of 3 Follow-up Appointments Return Appointment in 1 week. Nurse Visit as needed - to check wrap and  re-wrap Bathing/ Shower/ Hygiene May shower with wound dressing protected with water repellent cover or cast protector. No tub bath. - no soaking foot in tub Anesthetic (Use 'Patient Medications' Section for Anesthetic Order Entry) Lidocaine applied to wound bed Edema Control - Lymphedema / Segmental Compressive Device / Other Patient to wear own compression stockings. Remove compression stockings every night before going to bed and put on every morning when getting up. - pt has juxtafit velcro wrap-ON HOLD AT THIS TIME Elevate, Exercise Daily and A void Standing for Long Periods of Time. Elevate legs to the level of the heart and pump ankles as often as possible Elevate leg(s) parallel to the floor when sitting. DO YOUR BEST to sleep in the bed at night. DO NOT sleep in your recliner. Long hours of sitting in a recliner leads to swelling of the legs and/or potential wounds on your backside. Negative Pressure Wound Therapy Snap Vac applied - ON HOLD AT THIS TIME 05/20/23 Wound VAC settings at continuous pressure. Use foam to wound cavity. Please order WHITE foam to fill any tunnel/s and/or undermining when necessary. Change VAC dressing 2 X WEEK. Change canister as indicated when full. Number of foam/gauze pieces used in the dressing = - 2, one wedge piece blue foam in wound bed, one foam piece in bridge. Wound Treatment Wound #5 - Calcaneus Wound Laterality: Left Cleanser: Byram Ancillary Kit - 15 Day Supply (Generic) 1 x Per Week/30 Days Discharge Instructions: Use supplies as instructed; Kit contains: (15) Saline Bullets; (15) 3x3 Gauze; 15 pr Gloves Cleanser: Vashe 5.8 (oz) 1 x Per Week/30 Days Discharge Instructions: Use vashe 5.8 (oz) as directed Topical: Gentamicin 1 x Per Week/30 Days Discharge Instructions: Apply as directed by provider. Prim Dressing: Hydrofera Blue Ready Transfer Foam, 2.5x2.5 (in/in) 1 x Per Week/30 Days ary Discharge Instructions: PACK IN WOUND Apply  Hydrofera Blue Ready to wound bed as directed Secondary Dressing: Zetuvit Plus 4x4 (in/in) 1 x Per Week/30 Days Compression Wrap: Urgo K2 Lite, two layer compression system,  regular 1 x Per Week/30 Days Electronic Signature(s) Unsigned Entered By: Angelina Pih on 05/20/2023 14:44:32 -------------------------------------------------------------------------------- Problem List Details Patient Name: Date of Service: [name], [name] 05/20/2023 2:00 PM Medical Record Number: [id number] Patient Account Number: [id number] Date of Birth/Sex: Treating RN: 18-Jan-1946 (77 y.o. [name] Primary Care Provider: [name] Other Clinician: Referring Provider: Treating Provider/Extender: [name] in Treatment: [address] Problems Degrace, [name] ([id number]) [id number] Page 3 of 3 ICD-10 Encounter Code Description Active Date MDM Diagnosis E10.621 Type 1 diabetes mellitus with foot ulcer 11/26/2022 No Yes I73.89 Other specified peripheral vascular diseases 11/26/2022 No Yes L89.623 Pressure ulcer of left heel, stage 3 11/26/2022 No Yes L89.613 Pressure ulcer of right heel, stage 3 11/26/2022 No Yes L98.412 Non-pressure chronic ulcer of buttock with fat layer exposed 11/26/2022 No Yes I25.10 Atherosclerotic heart disease of native coronary artery without angina pectoris 11/26/2022 No Yes I50.42 Chronic combined systolic (congestive) and diastolic (congestive) heart failure 11/26/2022 No Yes I10 Essential (primary) hypertension 11/26/2022 No Yes Inactive Problems Resolved Problems Electronic Signature(s) Signed: 05/20/2023 2:12:02 PM By: [name] [name] Entered By: [name] on 05/20/2023 14:12:02

## 2023-05-20 NOTE — Progress Notes (Signed)
[name], [name] ([id number]) [id number] Page 1 of 8 Visit Report for 05/20/2023 Arrival Information Details Patient Name: Date of Service: [name], [name] 05/20/2023 2:00 PM Medical Record Number: [id number] Patient Account Number: [id number] Date of Birth/Sex: Treating RN: [birth year] (76 y.o. [name] Primary Care [name]: [name] Other Clinician: Referring [name]: Treating Judianne [name]/Extender: [name] in Treatment: 25 Visit Information History Since Last Visit Added or deleted any medications: No Patient Arrived: Wheel Chair Any new allergies or adverse reactions: No Arrival Time: 14:07 Had a fall or experienced change in No Accompanied By: spouse activities of daily living that may affect Transfer Assistance: None risk of falls: Patient Identification Verified: Yes Hospitalized since last visit: No Secondary Verification Process Completed: Yes Has Dressing in Place as Prescribed: Yes Patient Requires Transmission-Based Precautions: No Has Compression in Place as Prescribed: Yes Patient Has Alerts: Yes Pain Present Now: Yes Patient Alerts: Type I Diabetic eliquis ABI/TBI normal see scan Electronic Signature(s) Signed: 05/20/2023 5:13:20 PM By: [name] Entered By: [name] on 05/20/2023 14:10:44 -------------------------------------------------------------------------------- Clinic Level of Care Assessment Details Patient Name: Date of Service: [name], [name] 05/20/2023 2:00 PM Medical Record Number: [id number] Patient Account Number: [id number] Date of Birth/Sex: Treating RN: 24-Mar-1946 (77 y.o. [name] Primary Care Lorrin Nawrot: [name] Other Clinician: Referring [name]: Treating [name]/Extender: [name] in Treatment: 25 Clinic Level of Care Assessment Items TOOL 1 Quantity Score []  - 0 Use when EandM and Procedure is  performed on INITIAL visit ASSESSMENTS - Nursing Assessment / Reassessment []  - 0 General Physical Exam (combine w/ comprehensive assessment (listed just below) when performed on new pt. evals) []  - 0 Comprehensive Assessment (HX, ROS, Risk Assessments, Wounds Hx, etc.) ASSESSMENTS - Wound and Skin Assessment / Reassessment []  - 0 Dermatologic / Skin Assessment (not related to wound area) Kasinger, [name] H ([id number]) [id number] Page 2 of 8 ASSESSMENTS - Ostomy and/or Continence Assessment and Care []  - 0 Incontinence Assessment and Management []  - 0 Ostomy Care Assessment and Management (repouching, etc.) PROCESS - Coordination of Care []  - 0 Simple Patient / Family Education for ongoing care []  - 0 Complex (extensive) Patient / Family Education for ongoing care []  - 0 Staff obtains Chiropractor, Records, T Results / Process Orders est []  - 0 Staff telephones HHA, Nursing Homes / Clarify orders / etc []  - 0 Routine Transfer to another Facility (non-emergent condition) []  - 0 Routine Hospital Admission (non-emergent condition) []  - 0 New Admissions / Manufacturing engineer / Ordering NPWT Apligraf, etc. , []  - 0 Emergency Hospital Admission (emergent condition) PROCESS - Special Needs []  - 0 Pediatric / Minor Patient Management []  - 0 Isolation Patient Management []  - 0 Hearing / Language / Visual special needs []  - 0 Assessment of Community assistance (transportation, D/C planning, etc.) []  - 0 Additional assistance / Altered mentation []  - 0 Support Surface(s) Assessment (bed, cushion, seat, etc.) INTERVENTIONS - Miscellaneous []  - 0 External ear exam []  - 0 Patient Transfer (multiple staff / Nurse, adult / Similar devices) []  - 0 Simple Staple / Suture removal (25 or less) []  - 0 Complex Staple / Suture removal (26 or more) []  - 0 Hypo/Hyperglycemic Management (do not check if billed separately) []  - 0 Ankle / Brachial Index  (ABI) - do not check if billed separately Has the patient been seen at the hospital within the last three years: Yes Total Score: 0 Level Of Care: ____  Electronic Signature(s) Signed: 05/20/2023 5:13:20 PM By: [name] Entered By: [name] on 05/20/2023 14:59:48 -------------------------------------------------------------------------------- Compression Therapy Details Patient Name: Date of Service: [name], [name] 05/20/2023 2:00 PM Medical Record Number: [id number] Patient Account Number: [id number] Date of Birth/Sex: Treating RN: [birth year] (77 y.o. [name] Primary Care Pernie Grosso: [name] Other Clinician: Referring Chasin Findling: Treating Montina Dorrance/Extender: [name] in Treatment: 25 Compression Therapy Performed for Wound Assessment: Wound #5 Left Calcaneus Performed By: [name] Bodily, RN [name] ([id number]) [id number] Page 3 of 8 Compression Type: Double Layer Post Procedure Diagnosis Same as Pre-procedure Electronic Signature(s) Signed: 05/20/2023 5:13:20 PM By: [name] Entered By: [name] on 05/20/2023 14:43:04 -------------------------------------------------------------------------------- Encounter Discharge Information Details Patient Name: Date of Service: [name], [name] 05/20/2023 2:00 PM Medical Record Number: [id number] Patient Account Number: [id number] Date of Birth/Sex: Treating RN: 1946-07-05 (77 y.o. [name] Primary Care Meldon Hanzlik: [name] Other Clinician: Referring [name]: Treating Jaryan Chicoine/Extender: [name] in Treatment: 25 Encounter Discharge Information Items Discharge Condition: Stable Ambulatory Status: Wheelchair Discharge Destination: Home Transportation: Private Auto Accompanied By: [name] Schedule Follow-up Appointment: Yes Clinical Summary of Care: Electronic  Signature(s) Signed: 05/20/2023 5:13:20 PM By: [name] Entered By: [name] on 05/20/2023 15:00:58 -------------------------------------------------------------------------------- Lower Extremity Assessment Details Patient Name: Date of Service: [name], [name] 05/20/2023 2:00 PM Medical Record Number: [id number] Patient Account Number: [id number] Date of Birth/Sex: Treating RN: May 28, 1946 (77 y.o. [name] Primary Care Adean Milosevic: [name] Other Clinician: Referring Gunner Iodice: Treating Delana Manganello/Extender: [name] in Treatment: 25 Edema Assessment Assessed: Kyra Searles: No] [Right: No] Edema: [Left: Ye] [Right: s] [name][Left: ORTERFIELD, Domonique H ([id number])] [Right: [id number] Page 4 of 8] Calf Left: Right: Point of Measurement: 34 cm From Medial Instep 39 cm Ankle Left: Right: Point of Measurement: 11 cm From Medial Instep 25.5 cm Electronic Signature(s) Signed: 05/20/2023 5:13:20 PM By: [name] Entered By: [name] on 05/20/2023 14:28:54 -------------------------------------------------------------------------------- Multi Wound Chart Details Patient Name: Date of Service: [name], [name] 05/20/2023 2:00 PM Medical Record Number: [id number] Patient Account Number: [id number] Date of Birth/Sex: Treating RN: September 30, 1946 (77 y.o. [name] Primary Care Lamar Meter: [name] Other Clinician: Referring Shantrice Rodenberg: Treating Maleiya Pergola/Extender: [name] in Treatment: 25 Vital Signs Height(in): 69 Pulse(bpm): 86 Weight(lbs): 190 Blood Pressure(mmHg): 114/61 Body Mass Index(BMI): 28.1 Temperature(F): 97.8 Respiratory Rate(breaths/min): 18 [5:Photos:] [N/A:N/A] Left Calcaneus N/A N/A Wound Location: Pressure Injury N/A N/A Wounding Event: Diabetic Wound/Ulcer of the Lower N/A N/A Primary Etiology: Extremity Pressure Ulcer N/A N/A Secondary  Etiology: Coronary Artery Disease, N/A N/A Comorbid History: Hypertension, Type I Diabetes, Osteoarthritis, Received Chemotherapy 10/20/2022 N/A N/A Date Acquired: 25 N/A N/A Weeks of Treatment: Open N/A N/A Wound Status: No N/A N/A Wound Recurrence: 0.8x0.5x1.5 N/A N/A Measurements L x W x D (cm) 0.314 N/A N/A A (cm) : rea 0.471 N/A N/A Volume (cm) : 93.30% N/A N/A % Reduction in A rea: 0.00% N/A N/A % Reduction in Volume: Grade 2 N/A N/A Classification: Medium N/A N/A Exudate A mount: Serosanguineous N/A N/A Exudate Type: red, brown N/A N/A Exudate Color: Flat and Intact N/A N/A Wound Margin: ALGIRDAS, SOINE ([id number]) [id number] Page 5 of 8 Medium (34-66%) N/A N/A Granulation A mount: Red N/A N/A Granulation Quality: Medium (34-66%) N/A N/A Necrotic A mount: Fat Layer (Subcutaneous Tissue): Yes N/A N/A Exposed Structures: None N/A N/A Epithelialization: Treatment Notes Electronic Signature(s) Signed: 05/20/2023 5:13:20  PM By: [name] Entered By: [name] on 05/20/2023 14:29:00 -------------------------------------------------------------------------------- Multi-Disciplinary Care Plan Details Patient Name: Date of Service: [name], [name] 05/20/2023 2:00 PM Medical Record Number: [id number] Patient Account Number: [id number] Date of Birth/Sex: Treating RN: September 16, 1946 (77 y.o. [name] Primary Care Marguerette Sheller: [name] Other Clinician: Referring Amahia Madonia: Treating Domanick Cuccia/Extender: [name] in Treatment: 25 Active Inactive Pressure Nursing Diagnoses: Potential for impaired tissue integrity related to pressure, friction, moisture, and shear Goals: Patient will remain free of pressure ulcers Date Initiated: 11/26/2022 Target Resolution Date: 06/10/2023 Goal Status: Active Interventions: Assess: immobility, friction, shearing, incontinence upon admission and  as needed Assess offloading mechanisms upon admission and as needed Assess potential for pressure ulcer upon admission and as needed Notes: Electronic Signature(s) Signed: 05/20/2023 5:13:20 PM By: [name] Entered By: [name] on 05/20/2023 15:00:10 Visscher, [name] ([id number]) [id number] Page 6 of 8 -------------------------------------------------------------------------------- Pain Assessment Details Patient Name: Date of Service: [name], [name] 05/20/2023 2:00 PM Medical Record Number: [id number] Patient Account Number: [id number] Date of Birth/Sex: Treating RN: October 22, 1946 (77 y.o. [name] Primary Care Deauna Yaw: [name] Other Clinician: Referring Anju Sereno: Treating Arick Mareno/Extender: [name] in Treatment: 25 Active Problems Location of Pain Severity and Description of Pain Patient Has Paino Yes Site Locations Pain Management and Medication Current Pain Management: Notes back pain no wound pain Electronic Signature(s) Signed: 05/20/2023 5:13:20 PM By: [name] Entered By: [name] on 05/20/2023 14:13:08 -------------------------------------------------------------------------------- Patient/Caregiver Education Details Patient Name: Date of Service: [name], [name] 7/9/2024andnbsp2:00 PM Medical Record Number: [id number] Patient Account Number: [id number] Date of Birth/Gender: Treating RN: 08/24/1946 (77 y.o. [name] Primary Care Physician: [name] Other Clinician: Referring Physician: Treating Physician/Extender: [name] in Treatment: 25 Education Assessment Education Provided To: Patient Education Topics Provided Wound/Skin ImpairmentBENZION, EXLEY ([id number]) [id number] Page 7 of 8 Handouts: Caring for Your Ulcer Methods: Explain/Verbal Responses: State content  correctly Electronic Signature(s) Signed: 05/20/2023 5:13:20 PM By: [name] Entered By: [name] on 05/20/2023 15:00:22 -------------------------------------------------------------------------------- Wound Assessment Details Patient Name: Date of Service: [name], [name] 05/20/2023 2:00 PM Medical Record Number: [id number] Patient Account Number: [id number] Date of Birth/Sex: Treating RN: [birth year] (77 y.o. [name] Primary Care Detric Scalisi: [name] Other Clinician: Referring Kimbree Casanas: Treating Panhia Karl/Extender: [name] in Treatment: 25 Wound Status Wound Number: 5 Primary Diabetic Wound/Ulcer of the Lower Extremity Etiology: Wound Location: Left Calcaneus Secondary Pressure Ulcer Wounding Event: Pressure Injury Etiology: Date Acquired: 10/20/2022 Wound Open Weeks Of Treatment: 25 Status: Clustered Wound: No Comorbid Coronary Artery Disease, Hypertension, Type I Diabetes, History: Osteoarthritis, Received Chemotherapy Photos Wound Measurements Length: (cm) 0.8 Width: (cm) 0.5 Depth: (cm) 1.5 Area: (cm) 0.314 Volume: (cm) 0.471 % Reduction in Area: 93.3% % Reduction in Volume: 0% Epithelialization: None Tunneling: No Undermining: No Wound Description Classification: Grade 2 Wound Margin: Flat and Intact Exudate Amount: Medium Exudate Type: Purulent Exudate Color: yellow, brown, green Foul Odor After Cleansing: No Slough/Fibrino Yes Wound Bed Granulation Amount: Medium (34-66%) Exposed Structure Granulation Quality: Red Fat Layer (Subcutaneous Tissue) Exposed: Yes Necrotic Amount: Medium (34-66%) Kipp, Frederico H ([id number]) [id number] Page 8 of 8 Necrotic Quality: Adherent Slough Treatment Notes Wound #5 (Calcaneus) Wound Laterality: Left Cleanser Byram Ancillary Kit - 15 Day Supply Discharge Instruction: Use supplies as instructed; Kit contains: (15) Saline Bullets;  (15) 3x3 Gauze; 15 pr Gloves Vashe 5.8 (oz) Discharge Instruction:  Use vashe 5.8 (oz) as directed Peri-Wound Care Topical Gentamicin Discharge Instruction: Apply as directed by Zettie Gootee. Primary Dressing Hydrofera Blue Ready Transfer Foam, 2.5x2.5 (in/in) Discharge Instruction: PACK IN WOUND Apply Hydrofera Blue Ready to wound bed as directed Secondary Dressing Zetuvit Plus 4x4 (in/in) Secured With Compression Wrap Urgo K2 Lite, two layer compression system, regular Compression Stockings Add-Ons Electronic Signature(s) Signed: 05/20/2023 2:33:38 PM By: [name] Entered By: [name] on 05/20/2023 14:33:37 -------------------------------------------------------------------------------- Vitals Details Patient Name: Date of Service: [name], [name] 05/20/2023 2:00 PM Medical Record Number: [id number] Patient Account Number: [id number] Date of Birth/Sex: Treating RN: 08/10/1946 (77 y.o. [name] Primary Care Emine Lopata: [name] Other Clinician: Referring Rozelle Caudle: Treating Davis Vannatter/Extender: [name] in Treatment: 25 Vital Signs Time Taken: 14:10 Temperature (F): 97.8 Height (in): 69 Pulse (bpm): 86 Weight (lbs): 190 Respiratory Rate (breaths/min): 18 Body Mass Index (BMI): 28.1 Blood Pressure (mmHg): 114/61 Reference Range: 80 - 120 mg / dl Electronic Signature(s) Signed: 05/20/2023 5:13:20 PM By: [name] Entered By: [name] on 05/20/2023 14:12:48

## 2023-05-22 ENCOUNTER — Ambulatory Visit: Payer: Medicare HMO | Admitting: Infectious Diseases

## 2023-05-22 ENCOUNTER — Encounter: Payer: Self-pay | Admitting: Infectious Diseases

## 2023-05-22 ENCOUNTER — Ambulatory Visit: Payer: Medicare HMO | Attending: Infectious Diseases | Admitting: Infectious Diseases

## 2023-05-22 VITALS — BP 122/82 | HR 91 | Temp 97.2°F

## 2023-05-22 DIAGNOSIS — Z8614 Personal history of Methicillin resistant Staphylococcus aureus infection: Secondary | ICD-10-CM | POA: Diagnosis not present

## 2023-05-22 DIAGNOSIS — Z9641 Presence of insulin pump (external) (internal): Secondary | ICD-10-CM | POA: Insufficient documentation

## 2023-05-22 DIAGNOSIS — E1051 Type 1 diabetes mellitus with diabetic peripheral angiopathy without gangrene: Secondary | ICD-10-CM | POA: Diagnosis not present

## 2023-05-22 DIAGNOSIS — Z794 Long term (current) use of insulin: Secondary | ICD-10-CM | POA: Insufficient documentation

## 2023-05-22 DIAGNOSIS — I2510 Atherosclerotic heart disease of native coronary artery without angina pectoris: Secondary | ICD-10-CM | POA: Diagnosis not present

## 2023-05-22 DIAGNOSIS — Z87448 Personal history of other diseases of urinary system: Secondary | ICD-10-CM | POA: Diagnosis not present

## 2023-05-22 DIAGNOSIS — Z79899 Other long term (current) drug therapy: Secondary | ICD-10-CM | POA: Insufficient documentation

## 2023-05-22 DIAGNOSIS — Z86711 Personal history of pulmonary embolism: Secondary | ICD-10-CM | POA: Diagnosis not present

## 2023-05-22 DIAGNOSIS — Z86718 Personal history of other venous thrombosis and embolism: Secondary | ICD-10-CM | POA: Diagnosis not present

## 2023-05-22 DIAGNOSIS — Z955 Presence of coronary angioplasty implant and graft: Secondary | ICD-10-CM | POA: Insufficient documentation

## 2023-05-22 DIAGNOSIS — E119 Type 2 diabetes mellitus without complications: Secondary | ICD-10-CM | POA: Diagnosis not present

## 2023-05-22 DIAGNOSIS — D649 Anemia, unspecified: Secondary | ICD-10-CM | POA: Insufficient documentation

## 2023-05-22 DIAGNOSIS — M549 Dorsalgia, unspecified: Secondary | ICD-10-CM | POA: Diagnosis not present

## 2023-05-22 DIAGNOSIS — B9562 Methicillin resistant Staphylococcus aureus infection as the cause of diseases classified elsewhere: Secondary | ICD-10-CM | POA: Diagnosis not present

## 2023-05-22 DIAGNOSIS — Z792 Long term (current) use of antibiotics: Secondary | ICD-10-CM | POA: Insufficient documentation

## 2023-05-22 DIAGNOSIS — M009 Pyogenic arthritis, unspecified: Secondary | ICD-10-CM | POA: Diagnosis not present

## 2023-05-22 DIAGNOSIS — Z87440 Personal history of urinary (tract) infections: Secondary | ICD-10-CM | POA: Diagnosis not present

## 2023-05-22 DIAGNOSIS — I110 Hypertensive heart disease with heart failure: Secondary | ICD-10-CM | POA: Diagnosis not present

## 2023-05-22 DIAGNOSIS — Z1624 Resistance to multiple antibiotics: Secondary | ICD-10-CM | POA: Diagnosis not present

## 2023-05-22 DIAGNOSIS — D702 Other drug-induced agranulocytosis: Secondary | ICD-10-CM | POA: Diagnosis not present

## 2023-05-22 DIAGNOSIS — Z8551 Personal history of malignant neoplasm of bladder: Secondary | ICD-10-CM | POA: Insufficient documentation

## 2023-05-22 DIAGNOSIS — I10 Essential (primary) hypertension: Secondary | ICD-10-CM | POA: Insufficient documentation

## 2023-05-22 DIAGNOSIS — Z7901 Long term (current) use of anticoagulants: Secondary | ICD-10-CM | POA: Insufficient documentation

## 2023-05-22 DIAGNOSIS — I5042 Chronic combined systolic (congestive) and diastolic (congestive) heart failure: Secondary | ICD-10-CM | POA: Diagnosis not present

## 2023-05-22 DIAGNOSIS — L98412 Non-pressure chronic ulcer of buttock with fat layer exposed: Secondary | ICD-10-CM | POA: Diagnosis not present

## 2023-05-22 DIAGNOSIS — L89629 Pressure ulcer of left heel, unspecified stage: Secondary | ICD-10-CM | POA: Insufficient documentation

## 2023-05-22 DIAGNOSIS — M4854XA Collapsed vertebra, not elsewhere classified, thoracic region, initial encounter for fracture: Secondary | ICD-10-CM | POA: Insufficient documentation

## 2023-05-22 DIAGNOSIS — Z87891 Personal history of nicotine dependence: Secondary | ICD-10-CM | POA: Diagnosis not present

## 2023-05-22 DIAGNOSIS — N1330 Unspecified hydronephrosis: Secondary | ICD-10-CM | POA: Insufficient documentation

## 2023-05-22 DIAGNOSIS — L89623 Pressure ulcer of left heel, stage 3: Secondary | ICD-10-CM | POA: Diagnosis not present

## 2023-05-22 DIAGNOSIS — L89613 Pressure ulcer of right heel, stage 3: Secondary | ICD-10-CM | POA: Diagnosis not present

## 2023-05-22 DIAGNOSIS — Z85828 Personal history of other malignant neoplasm of skin: Secondary | ICD-10-CM | POA: Diagnosis not present

## 2023-05-22 DIAGNOSIS — E10621 Type 1 diabetes mellitus with foot ulcer: Secondary | ICD-10-CM | POA: Diagnosis not present

## 2023-05-22 DIAGNOSIS — A4902 Methicillin resistant Staphylococcus aureus infection, unspecified site: Secondary | ICD-10-CM
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
## 2023-05-22 NOTE — Patient Instructions (Signed)
You are here for follow of MRSA infection- you are on doxy , and you have back pain and recent MRI shows T11-T12 vertebral changes- you have a PET scan scheduled- will discuss with Dr.Saullo regarding vertebral biopsy, for pathology and culture and decide on antibiotics

## 2023-05-23 ENCOUNTER — Other Ambulatory Visit: Payer: Medicare HMO

## 2023-05-23 ENCOUNTER — Other Ambulatory Visit: Payer: Self-pay | Admitting: Infectious Diseases

## 2023-05-23 ENCOUNTER — Telehealth: Payer: Self-pay

## 2023-05-23 DIAGNOSIS — J90 Pleural effusion, not elsewhere classified: Secondary | ICD-10-CM | POA: Diagnosis not present

## 2023-05-23 DIAGNOSIS — N1339 Other hydronephrosis: Secondary | ICD-10-CM

## 2023-05-23 DIAGNOSIS — C679 Malignant neoplasm of bladder, unspecified: Secondary | ICD-10-CM | POA: Diagnosis not present

## 2023-05-23 DIAGNOSIS — R601 Generalized edema: Secondary | ICD-10-CM | POA: Diagnosis not present

## 2023-05-23 DIAGNOSIS — I700 Atherosclerosis of aorta: Secondary | ICD-10-CM | POA: Diagnosis not present

## 2023-05-23 NOTE — Progress Notes (Signed)
NEOMIAH, MAYVILLE ([id number]) [id number] Page 1 of 3 Visit Report for 05/22/2023 Arrival Information Details Patient Name: Date of Service: [name], [name] 05/22/2023 11:00 A M Medical Record Number: [id number] Patient Account Number: [id number] Date of Birth/Sex: Treating RN: 1946-04-08 (77 y.o. [name] Primary Care Layan Zalenski: [name] Other Clinician: Referring Eunice Oldaker: Treating Brenlee Koskela/Extender: [name] in Treatment: 25 Visit Information History Since Last Visit Added or deleted any medications: No Patient Arrived: Wheel Chair Any new allergies or adverse reactions: No Arrival Time: 11:07 Has Dressing in Place as Prescribed: Yes Accompanied By: partner Pain Present Now: No Transfer Assistance: EasyPivot Patient Lift Patient Identification Verified: Yes Secondary Verification Process Completed: Yes Patient Requires Transmission-Based No Precautions: Patient Has Alerts: Yes Patient Alerts: Type I Diabetic eliquis ABI/TBI normal see scan Electronic Signature(s) Signed: 05/22/2023 5:16:05 PM By: Midge Aver MSN RN CNS WTA Entered By: Midge Aver on 05/22/2023 11:32:46 -------------------------------------------------------------------------------- Compression Therapy Details Patient Name: Date of Service: [name], [name] 05/22/2023 11:00 A M Medical Record Number: [id number] Patient Account Number: [id number] Date of Birth/Sex: Treating RN: [birth year] (77 y.o. [name] Primary Care Corrado Hymon: [name] Other Clinician: Referring Amreen Raczkowski: Treating Moris Ratchford/Extender: [name] in Treatment: 25 Compression Therapy Performed for Wound Assessment: Wound #5 Left Calcaneus Performed By: Clinician Midge Aver, RN Compression Type: Three Emergency planning/management officer) Signed: 05/22/2023 5:16:05 PM By: Midge Aver MSN RN CNS WTA Entered By: Midge Aver on  05/22/2023 11:33:16 Briseno, [name] ([id number]) [id number] Page 2 of 3 -------------------------------------------------------------------------------- Encounter Discharge Information Details Patient Name: Date of Service: [name], [name] 05/22/2023 11:00 A M Medical Record Number: [id number] Patient Account Number: [id number] Date of Birth/Sex: Treating RN: 1946/03/19 (77 y.o. [name] Primary Care Lavergne Hiltunen: [name] Other Clinician: Referring Gracyn Santillanes: Treating Yael Angerer/Extender: [name] in Treatment: 25 Encounter Discharge Information Items Discharge Condition: Stable Ambulatory Status: Wheelchair Discharge Destination: Home Transportation: Private Auto Accompanied By: partner Schedule Follow-up Appointment: Yes Clinical Summary of Care: Electronic Signature(s) Signed: 05/22/2023 5:16:05 PM By: Midge Aver MSN RN CNS WTA Entered By: Midge Aver on 05/22/2023 11:34:16 -------------------------------------------------------------------------------- Wound Assessment Details Patient Name: Date of Service: [name], [name] 05/22/2023 11:00 A M Medical Record Number: [id number] Patient Account Number: [id number] Date of Birth/Sex: Treating RN: [birth year] (77 y.o. [name] Primary Care Kamiyah Kindel: [name] Other Clinician: Referring Bryahna Lesko: Treating Lavida Patch/Extender: [name] in Treatment: 25 Wound Status Wound Number: 5 Primary Etiology: Diabetic Wound/Ulcer of the Lower Extremity Wound Location: Left Calcaneus Secondary Etiology: Pressure Ulcer Wounding Event: Pressure Injury Wound Status: Open Date Acquired: 10/20/2022 Weeks Of Treatment: 25 Clustered Wound: No Wound Measurements Length: (cm) 0.5 Width: (cm) 0.5 Depth: (cm) 1.5 Area: (cm) 0.196 Volume: (cm) 0.295 % Reduction in Area: 95.8% % Reduction in Volume: 37.4% Wound  Description Classification: Grade 2 Tipps, Dayon H ([id number]) Exudate Amount: Medium Exudate Type: Purulent Exudate Color: yellow, brown, green [id number] Page 3 of 3 Treatment Notes Wound #5 (Calcaneus) Wound Laterality: Left Cleanser Byram Ancillary Kit - 15 Day Supply Discharge Instruction: Use supplies as instructed; Kit contains: (15) Saline Bullets; (15) 3x3 Gauze; 15 pr Gloves Vashe 5.8 (oz) Discharge Instruction: Use vashe 5.8 (oz) as directed Peri-Wound Care Topical Gentamicin Discharge Instruction: Apply as directed by Greenley Martone. Primary Dressing Hydrofera Blue Ready Transfer Foam, 2.5x2.5 (in/in) Discharge Instruction: PACK IN WOUND Apply Hydrofera Blue Ready to wound bed as directed Secondary Dressing Zetuvit Plus  4x4 (in/in) Secured With Compression Wrap Urgo K2 Lite, two layer compression system, regular Compression Stockings Add-Ons Electronic Signature(s) Signed: 05/22/2023 5:16:05 PM By: Midge Aver MSN RN CNS WTA Entered By: Midge Aver on 05/22/2023 11:08:17

## 2023-05-23 NOTE — Progress Notes (Signed)
Sent referral to [facility] urology

## 2023-05-23 NOTE — Telephone Encounter (Signed)
Received call from Oletta Lamas (DPR), he spoke with urology and was told that Dr. Lynne Logan referral would need to specify a STAT timeframe in order for Donnell to be seen quickly.   Sandie Ano, RN

## 2023-05-23 NOTE — Progress Notes (Signed)
RAQUAN, CARROL ([id number]) [id number] Page 1 of 2 Visit Report for 05/22/2023 Physician Orders Details Patient Name: Date of Service: [name], [name] 05/22/2023 11:00 A M Medical Record Number: [id number] Patient Account Number: [id number] Date of Birth/Sex: Treating RN: 04/03/1946 (77 y.o. [name] Primary Care Provider: [name] Other Clinician: Referring Provider: Treating Provider/Extender: [name] in Treatment: 25 Verbal / Phone Orders: No Diagnosis Coding Follow-up Appointments Return Appointment in 1 week. Nurse Visit as needed - to check wrap and re-wrap Bathing/ Shower/ Hygiene May shower with wound dressing protected with water repellent cover or cast protector. No tub bath. - no soaking foot in tub Anesthetic (Use 'Patient Medications' Section for Anesthetic Order Entry) Lidocaine applied to wound bed Edema Control - Lymphedema / Segmental Compressive Device / Other Patient to wear own compression stockings. Remove compression stockings every night before going to bed and put on every morning when getting up. - pt has juxtafit velcro wrap-ON HOLD AT THIS TIME Elevate, Exercise Daily and A void Standing for Long Periods of Time. Elevate legs to the level of the heart and pump ankles as often as possible Elevate leg(s) parallel to the floor when sitting. DO YOUR BEST to sleep in the bed at night. DO NOT sleep in your recliner. Long hours of sitting in a recliner leads to swelling of the legs and/or potential wounds on your backside. Negative Pressure Wound Therapy Snap Vac applied - ON HOLD AT THIS TIME 05/20/23 Wound VAC settings at continuous pressure. Use foam to wound cavity. Please order WHITE foam to fill any tunnel/s and/or undermining when necessary. Change VAC dressing 2 X WEEK. Change canister as indicated when full. Number of foam/gauze pieces used in the dressing = - 2, one  wedge piece blue foam in wound bed, one foam piece in bridge. Wound Treatment Wound #5 - Calcaneus Wound Laterality: Left Cleanser: Byram Ancillary Kit - 15 Day Supply (Generic) 1 x Per Week/30 Days Discharge Instructions: Use supplies as instructed; Kit contains: (15) Saline Bullets; (15) 3x3 Gauze; 15 pr Gloves Cleanser: Vashe 5.8 (oz) 1 x Per Week/30 Days Discharge Instructions: Use vashe 5.8 (oz) as directed Topical: Gentamicin 1 x Per Week/30 Days Discharge Instructions: Apply as directed by provider. Prim Dressing: Hydrofera Blue Ready Transfer Foam, 2.5x2.5 (in/in) 1 x Per Week/30 Days ary Discharge Instructions: PACK IN WOUND Apply Hydrofera Blue Ready to wound bed as directed Secondary Dressing: Zetuvit Plus 4x4 (in/in) 1 x Per Week/30 Days Compression Wrap: Urgo K2 Lite, two layer compression system, regular 1 x Per Week/30 Days Electronic Signature(s) Signed: 05/22/2023 5:16:05 PM By: Midge Aver MSN RN CNS WTA Signed: 05/22/2023 5:17:09 PM By: [name] [name] Entered By: Midge Aver on 05/22/2023 11:33:45 Casebolt, [name] ([id number]) [id number] Page 2 of 2 -------------------------------------------------------------------------------- SuperBill Details Patient Name: Date of Service: [name], [name] 05/22/2023 Medical Record Number: [id number] Patient Account Number: [id number] Date of Birth/Sex: Treating RN: Mar 19, 1946 (77 y.o. [name] Primary Care Provider: [name] Other Clinician: Referring Provider: Treating Provider/Extender: [name] in Treatment: 25 Diagnosis Coding ICD-10 Codes Code Description [phone] Type 1 diabetes mellitus with foot ulcer I73.89 Other specified peripheral vascular diseases L89.623 Pressure ulcer of left heel, stage 3 L89.613 Pressure ulcer of right heel, stage 3 L98.412 Non-pressure chronic ulcer of buttock with fat layer exposed I25.10 Atherosclerotic heart  disease of native coronary artery without angina pectoris I50.42 Chronic combined systolic (congestive) and diastolic (congestive)  heart failure I10 Essential (primary) hypertension Facility Procedures : CPT4 Code: 81191478 Description: (Facility Use Only) [phone] - APPLY MULTLAY COMPRS LWR LT LEG Modifier: Quantity: 1 Electronic Signature(s) Signed: 05/22/2023 5:16:05 PM By: Midge Aver MSN RN CNS WTA Signed: 05/22/2023 5:17:09 PM By: [name] [name] Entered By: Midge Aver on 05/22/2023 11:34:37

## 2023-05-27 ENCOUNTER — Encounter: Payer: Medicare HMO | Admitting: Physician Assistant

## 2023-05-27 DIAGNOSIS — E10621 Type 1 diabetes mellitus with foot ulcer: Secondary | ICD-10-CM | POA: Diagnosis not present

## 2023-05-27 DIAGNOSIS — Z87891 Personal history of nicotine dependence: Secondary | ICD-10-CM | POA: Diagnosis not present

## 2023-05-27 DIAGNOSIS — L97422 Non-pressure chronic ulcer of left heel and midfoot with fat layer exposed: Secondary | ICD-10-CM | POA: Diagnosis not present

## 2023-05-27 DIAGNOSIS — Z85828 Personal history of other malignant neoplasm of skin: Secondary | ICD-10-CM | POA: Diagnosis not present

## 2023-05-27 DIAGNOSIS — L89623 Pressure ulcer of left heel, stage 3: Secondary | ICD-10-CM | POA: Diagnosis not present

## 2023-05-27 DIAGNOSIS — L89613 Pressure ulcer of right heel, stage 3: Secondary | ICD-10-CM | POA: Diagnosis not present

## 2023-05-27 DIAGNOSIS — I110 Hypertensive heart disease with heart failure: Secondary | ICD-10-CM | POA: Diagnosis not present

## 2023-05-27 DIAGNOSIS — I2510 Atherosclerotic heart disease of native coronary artery without angina pectoris: Secondary | ICD-10-CM | POA: Diagnosis not present

## 2023-05-27 DIAGNOSIS — I5042 Chronic combined systolic (congestive) and diastolic (congestive) heart failure: Secondary | ICD-10-CM | POA: Diagnosis not present

## 2023-05-27 DIAGNOSIS — L98412 Non-pressure chronic ulcer of buttock with fat layer exposed: Secondary | ICD-10-CM | POA: Diagnosis not present

## 2023-05-27 NOTE — Progress Notes (Signed)
KEES, IDROVO ([id number]) [id number] Page 1 of 9 Visit Report for 05/27/2023 Arrival Information Details Patient Name: Date of Service: [name] RTERFIELD, [name] 05/27/2023 3:00 PM Medical Record Number: [id number] Patient Account Number: [id number] Date of Birth/Sex: Treating RN: 1946/04/08 (77 y.o. [name] Primary Care Keeara Frees: [name] Other Clinician: Referring Theta Leaf: Treating Randi [employer]/Extender: [name] in Treatment: 26 Visit Information History Since Last Visit Added or deleted any medications: No Patient Arrived: Wheel Chair Any new allergies or adverse reactions: No Arrival Time: 15:14 Had a fall or experienced change in No Accompanied By: spouse activities of daily living that may affect Transfer Assistance: None risk of falls: Patient Identification Verified: Yes Hospitalized since last visit: No Secondary Verification Process Completed: Yes Has Dressing in Place as Prescribed: Yes Patient Requires Transmission-Based Precautions: No Has Compression in Place as Prescribed: Yes Patient Has Alerts: Yes Pain Present Now: Yes Patient Alerts: Type I Diabetic eliquis ABI/TBI normal see scan Electronic Signature(s) Signed: 05/27/2023 4:40:28 PM By: Angelina Pih Entered By: Angelina Pih on 05/27/2023 15:15:00 -------------------------------------------------------------------------------- Clinic Level of Care Assessment Details Patient Name: Date of Service: [name] RTERFIELD, [name] 05/27/2023 3:00 PM Medical Record Number: [id number] Patient Account Number: [id number] Date of Birth/Sex: Treating RN: June 29, 1946 (77 y.o. [name] Primary Care Carlita Whitcomb: [name] Other Clinician: Referring Adeeb Konecny: Treating Wylder Macomber/Extender: [name] in Treatment: 26 Clinic Level of Care Assessment Items TOOL 1 Quantity Score []  - 0 Use when EandM and Procedure is  performed on INITIAL visit ASSESSMENTS - Nursing Assessment / Reassessment []  - 0 General Physical Exam (combine w/ comprehensive assessment (listed just below) when performed on new pt. evals) []  - 0 Comprehensive Assessment (HX, ROS, Risk Assessments, Wounds Hx, etc.) ASSESSMENTS - Wound and Skin Assessment / Reassessment []  - 0 Dermatologic / Skin Assessment (not related to wound area) Bruhl, Aeon H ([id number]) [phone].pdf Page 2 of 9 ASSESSMENTS - Ostomy and/or Continence Assessment and Care []  - 0 Incontinence Assessment and Management []  - 0 Ostomy Care Assessment and Management (repouching, etc.) PROCESS - Coordination of Care []  - 0 Simple Patient / Family Education for ongoing care []  - 0 Complex (extensive) Patient / Family Education for ongoing care []  - 0 Staff obtains Chiropractor, Records, T Results / Process Orders est []  - 0 Staff telephones HHA, Nursing Homes / Clarify orders / etc []  - 0 Routine Transfer to another Facility (non-emergent condition) []  - 0 Routine Hospital Admission (non-emergent condition) []  - 0 New Admissions / Manufacturing engineer / Ordering NPWT Apligraf, etc. , []  - 0 Emergency Hospital Admission (emergent condition) PROCESS - Special Needs []  - 0 Pediatric / Minor Patient Management []  - 0 Isolation Patient Management []  - 0 Hearing / Language / Visual special needs []  - 0 Assessment of Community assistance (transportation, D/C planning, etc.) []  - 0 Additional assistance / Altered mentation []  - 0 Support Surface(s) Assessment (bed, cushion, seat, etc.) INTERVENTIONS - Miscellaneous []  - 0 External ear exam []  - 0 Patient Transfer (multiple staff / Nurse, adult / Similar devices) []  - 0 Simple Staple / Suture removal (25 or less) []  - 0 Complex Staple / Suture removal (26 or more) []  - 0 Hypo/Hyperglycemic Management (do not check if billed separately) []  - 0 Ankle / Brachial Index  (ABI) - do not check if billed separately Has the patient been seen at the hospital within the last three years: Yes Total Score: 0 Level Of Care: ____  Electronic Signature(s) Signed: 05/27/2023 4:40:28 PM By: Angelina Pih Entered By: Angelina Pih on 05/27/2023 16:26:18 -------------------------------------------------------------------------------- Compression Therapy Details Patient Name: Date of Service: [name] RTERFIELD, [name] 05/27/2023 3:00 PM Medical Record Number: [id number] Patient Account Number: [id number] Date of Birth/Sex: Treating RN: [birth year] (77 y.o. [name] Primary Care Frankee Gritz: [name] Other Clinician: Referring Arlys Scatena: Treating Graison Leinberger/Extender: [name] in Treatment: 26 Compression Therapy Performed for Wound Assessment: Wound #5 Left Calcaneus Performed By: Holly Bodily, RN Sanderford, [name] ([id number]) [id number] Page 3 of 9 Compression Type: Double Layer Post Procedure Diagnosis Same as Pre-procedure Electronic Signature(s) Signed: 05/27/2023 4:40:28 PM By: Angelina Pih Entered By: Angelina Pih on 05/27/2023 15:47:04 -------------------------------------------------------------------------------- Encounter Discharge Information Details Patient Name: Date of Service: [name] RTERFIELD, [name] 05/27/2023 3:00 PM Medical Record Number: [id number] Patient Account Number: [id number] Date of Birth/Sex: Treating RN: January 15, 1946 (77 y.o. [name] Primary Care Janeece Blok: [name] Other Clinician: Referring Levie Owensby: Treating Allisha Harter/Extender: [name] in Treatment: 26 Encounter Discharge Information Items Discharge Condition: Stable Ambulatory Status: Wheelchair Discharge Destination: Home Transportation: Private Auto Accompanied By: spouse Schedule Follow-up Appointment: Yes Clinical Summary of Care: Electronic  Signature(s) Signed: 05/27/2023 4:27:27 PM By: Angelina Pih Entered By: Angelina Pih on 05/27/2023 16:27:27 -------------------------------------------------------------------------------- Lower Extremity Assessment Details Patient Name: Date of Service: [name] RTERFIELD, [name] 05/27/2023 3:00 PM Medical Record Number: [id number] Patient Account Number: [id number] Date of Birth/Sex: Treating RN: Mar 18, 1946 (77 y.o. [name] Primary Care Jahleel Stroschein: [name] Other Clinician: Referring Twanda Stakes: Treating Rihana Kiddy/Extender: [name] in Treatment: 26 Edema Assessment Assessed: [Left: No] [Right: No] Edema: [Left: Ye] [Right: s] P[Left: ORTERFIELD, Brogan H ([id number])] [Right: [id number] Page 4 of 9] Calf Left: Right: Point of Measurement: 34 cm From Medial Instep 33 cm Ankle Left: Right: Point of Measurement: 11 cm From Medial Instep 23.8 cm Vascular Assessment Pulses: Dorsalis Pedis Palpable: [Left:Yes] Electronic Signature(s) Signed: 05/27/2023 4:40:28 PM By: Angelina Pih Entered By: Angelina Pih on 05/27/2023 15:18:41 -------------------------------------------------------------------------------- Multi Wound Chart Details Patient Name: Date of Service: [name] RTERFIELD, [name] 05/27/2023 3:00 PM Medical Record Number: [id number] Patient Account Number: [id number] Date of Birth/Sex: Treating RN: [birth year] (77 y.o. [name] Primary Care Michela Herst: [name] Other Clinician: Referring Edessa Jakubowicz: Treating Dollie Mayse/Extender: [name] in Treatment: 26 Vital Signs Height(in): 69 Pulse(bpm): 87 Weight(lbs): 190 Blood Pressure(mmHg): 108/66 Body Mass Index(BMI): 28.1 Temperature(F): 97.6 Respiratory Rate(breaths/min): 18 [5:Photos:] [N/A:N/A] Left Calcaneus N/A N/A Wound Location: Pressure Injury N/A N/A Wounding Event: Diabetic Wound/Ulcer of the Lower N/A  N/A Primary Etiology: Extremity Pressure Ulcer N/A N/A Secondary Etiology: Coronary Artery Disease, N/A N/A Comorbid History: Hypertension, Type I Diabetes, Osteoarthritis, Received Chemotherapy 10/20/2022 N/A N/A Date Acquired: 48 N/A N/A Weeks of Treatment: Open N/A N/A Wound Status: No N/A N/A Wound Recurrence: 1x1.3x1.6 N/A N/A Measurements L x W x D (cm) 1.021 N/A N/A A (cm) : JOELLE, FLESSNER ([id number]) [id number] Page 5 of 9 1.634 N/A N/A Volume (cm) : 78.30% N/A N/A % Reduction in A rea: -246.90% N/A N/A % Reduction in Volume: Grade 2 N/A N/A Classification: Medium N/A N/A Exudate A mount: Serosanguineous N/A N/A Exudate Type: red, brown N/A N/A Exudate Color: Large (67-100%) N/A N/A Granulation A mount: Red N/A N/A Granulation Quality: Small (1-33%) N/A N/A Necrotic A mount: Fat Layer (Subcutaneous Tissue): Yes N/A N/A Exposed Structures: None N/A N/A Epithelialization: Treatment Notes Electronic Signature(s) Signed: 05/27/2023 4:40:28  PM By: Angelina Pih Entered By: Angelina Pih on 05/27/2023 15:19:02 -------------------------------------------------------------------------------- Multi-Disciplinary Care Plan Details Patient Name: Date of Service: [name] RTERFIELD, [name] 05/27/2023 3:00 PM Medical Record Number: [id number] Patient Account Number: [id number] Date of Birth/Sex: Treating RN: [birth year] (77 y.o. [name] Primary Care Ernst Cumpston: [name] Other Clinician: Referring Candela Krul: Treating Burnadette Baskett/Extender: [name] in Treatment: 26 Active Inactive Pressure Nursing Diagnoses: Potential for impaired tissue integrity related to pressure, friction, moisture, and shear Goals: Patient will remain free of pressure ulcers Date Initiated: 11/26/2022 Target Resolution Date: 06/10/2023 Goal Status: Active Interventions: Assess: immobility, friction, shearing,  incontinence upon admission and as needed Assess offloading mechanisms upon admission and as needed Assess potential for pressure ulcer upon admission and as needed Notes: Electronic Signature(s) Signed: 05/27/2023 4:26:42 PM By: Angelina Pih Entered By: Angelina Pih on 05/27/2023 16:26:41 Stangeland, [name] ([id number]) [id number] Page 6 of 9 -------------------------------------------------------------------------------- Pain Assessment Details Patient Name: Date of Service: [name] RTERFIELD, [name] 05/27/2023 3:00 PM Medical Record Number: [id number] Patient Account Number: [id number] Date of Birth/Sex: Treating RN: January 08, 1946 (77 y.o. [name] Primary Care Marnesha Gagen: [name] Other Clinician: Referring Maryann Mccall: Treating Amoura Ransier/Extender: [name] in Treatment: 26 Active Problems Location of Pain Severity and Description of Pain Patient Has Paino Yes Site Locations Rate the pain. Current Pain Level: 2 Pain Management and Medication Current Pain Management: Notes back pain Electronic Signature(s) Signed: 05/27/2023 4:40:28 PM By: Angelina Pih Entered By: Angelina Pih on 05/27/2023 15:17:41 -------------------------------------------------------------------------------- Patient/Caregiver Education Details Patient Name: Date of Service: [name] RTERFIELD, CLA Reine Just 7/16/2024andnbsp3:00 PM Medical Record Number: [id number] Patient Account Number: [id number] Date of Birth/Gender: Treating RN: 01-05-1946 (77 y.o. [name] Primary Care Physician: [name] Other Clinician: TRACE, WIRICK ([id number]) [id number] Page 7 of 9 Referring Physician: Treating Physician/Extender: [name] in Treatment: 26 Education Assessment Education Provided To: Patient Education Topics Provided Wound/Skin Impairment: Handouts: Caring for Your  Ulcer Methods: Explain/Verbal Responses: State content correctly Electronic Signature(s) Signed: 05/27/2023 4:40:28 PM By: Angelina Pih Entered By: Angelina Pih on 05/27/2023 16:26:53 -------------------------------------------------------------------------------- Wound Assessment Details Patient Name: Date of Service: [name] RTERFIELD, [name] 05/27/2023 3:00 PM Medical Record Number: [id number] Patient Account Number: [id number] Date of Birth/Sex: Treating RN: [birth year] (77 y.o. [name] Primary Care Jona Zappone: [name] Other Clinician: Referring Robyne Matar: Treating Javeah Loeza/Extender: [name] in Treatment: 26 Wound Status Wound Number: 5 Primary Diabetic Wound/Ulcer of the Lower Extremity Etiology: Wound Location: Left Calcaneus Secondary Pressure Ulcer Wounding Event: Pressure Injury Etiology: Date Acquired: 10/20/2022 Wound Open Weeks Of Treatment: 26 Status: Clustered Wound: No Comorbid Coronary Artery Disease, Hypertension, Type I Diabetes, History: Osteoarthritis, Received Chemotherapy Photos Wound Measurements Length: (cm) 1 Width: (cm) 1.3 Depth: (cm) 1.6 Area: (cm) 1.0 Volume: (cm) 1.6 Mounger, Thamas H ([id number]) Wound Description Classification: Grade 2 Exudate Amount: Medium Exudate Type: Serosanguineo Exudate Color: red, brown Foul Odor After Cleansing: Slough/Fibrino % Reduction in Area: 78.3% % Reduction in Volume: -246.9% Epithelialization: None 21 Tunneling: No 34 Undermining: No [id number] Page 8 of 9 [place] No Yes Wound Bed Granulation Amount: Large (67-100%) Exposed Structure Granulation Quality: Red Fat Layer (Subcutaneous Tissue) Exposed: Yes Necrotic Amount: Small (1-33%) Necrotic Quality: Adherent Slough Treatment Notes Wound #5 (Calcaneus) Wound Laterality: Left Cleanser Byram Ancillary Kit - 15 Day Supply Discharge Instruction: Use supplies as instructed;  Kit contains: (15) Saline Bullets; (15) 3x3 Gauze; 15 pr Gloves Vashe 5.8 (oz) Discharge  Instruction: Use vashe 5.8 (oz) as directed Peri-Wound Care Topical Gentamicin Discharge Instruction: Apply as directed by Anayiah Howden. Primary Dressing Hydrofera Blue Ready Transfer Foam, 2.5x2.5 (in/in) Discharge Instruction: PACK IN WOUND Apply Hydrofera Blue Ready to wound bed as directed Secondary Dressing Zetuvit Plus 4x4 (in/in) Secured With Compression Wrap Urgo K2 Lite, two layer compression system, regular Compression Stockings Add-Ons Electronic Signature(s) Signed: 05/27/2023 4:40:28 PM By: Angelina Pih Entered By: Angelina Pih on 05/27/2023 15:18:20 -------------------------------------------------------------------------------- Vitals Details Patient Name: Date of Service: [name] RTERFIELD, [name] 05/27/2023 3:00 PM Medical Record Number: [id number] Patient Account Number: [id number] Date of Birth/Sex: Treating RN: 1945-11-18 (77 y.o. [name] Primary Care Milessa Hogan: [name] Other Clinician: Referring Chaniece Barbato: Treating Dimitry Holsworth/Extender: [name] in Treatment: 26 Vital Signs Time Taken: 15:15 Temperature (F): 97.6 Height (in): 69 Pulse (bpm): 87 Weight (lbs): 190 Respiratory Rate (breaths/min): 18 Maravilla, Tyaire H ([id number]) [id number] Page 9 of 9 Body Mass Index (BMI): 28.1 Blood Pressure (mmHg): 108/66 Reference Range: 80 - 120 mg / dl Electronic Signature(s) Signed: 05/27/2023 4:40:28 PM By: Angelina Pih Entered By: Angelina Pih on 05/27/2023 15:17:23

## 2023-05-27 NOTE — Progress Notes (Signed)
[name], [name] ([id number]) [id number] Page 1 of 4 Visit Report for 05/27/2023 Chief Complaint Document Details Patient Name: Date of Service: [name], [name] [name] 05/27/2023 3:00 PM Medical Record Number: [id number] Patient Account Number: [id number] Date of Birth/Sex: Treating RN: [birth year] (77 y.o. [name] Primary Care Provider: Jerl [name] Other Clinician: Referring Provider: Treating Provider/Extender: [name] in Treatment: 26 Information Obtained from: Patient Chief Complaint Left heel pressure ulcer Electronic Signature(s) Signed: 05/27/2023 3:51:40 PM By: [name] Derry [name] Entered By: [name] on 05/27/2023 15:51:40 -------------------------------------------------------------------------------- Physician Orders Details Patient Name: Date of Service: [name], [name] [name] 05/27/2023 3:00 PM Medical Record Number: [id number] Patient Account Number: [id number] Date of Birth/Sex: Treating RN: 02-25-1946 (77 y.o. [name] Primary Care Provider: Jerl [name] Other Clinician: Referring Provider: Treating Provider/Extender: [name] in Treatment: 26 Verbal / Phone Orders: No Diagnosis Coding Follow-up Appointments Return Appointment in 1 week. Nurse Visit as needed - to check wrap and re-wrap Bathing/ Shower/ Hygiene May shower with wound dressing protected with water repellent cover or cast protector. No tub bath. - no soaking foot in tub Anesthetic (Use 'Patient Medications' Section for Anesthetic Order Entry) Lidocaine applied to wound bed Edema Control - Lymphedema / Segmental Compressive Device / Other Patient to wear own compression stockings. Remove compression stockings every night before going to bed and put on every morning when getting up. - pt has juxtafit velcro wrap-ON HOLD AT THIS TIME Elevate, Exercise Daily and A void Standing for Long Periods  of Time. [name], [name] ([id number]) [id number] Page 2 of 4 Elevate legs to the level of the heart and pump ankles as often as possible Elevate leg(s) parallel to the floor when sitting. DO YOUR BEST to sleep in the bed at night. DO NOT sleep in your recliner. Long hours of sitting in a recliner leads to swelling of the legs and/or potential wounds on your backside. Negative Pressure Wound Therapy Snap Vac applied - ON HOLD AT THIS TIME 05/20/23 Wound VAC settings at continuous pressure. Use foam to wound cavity. Please order WHITE foam to fill any tunnel/s and/or undermining when necessary. Change VAC dressing 2 X WEEK. Change canister as indicated when full. Number of foam/gauze pieces used in the dressing = - 2, one wedge piece blue foam in wound bed, one foam piece in bridge. Wound Treatment Wound #5 - Calcaneus Wound Laterality: Left Cleanser: Byram Ancillary Kit - 15 Day Supply (Generic) 1 x Per Week/30 Days Discharge Instructions: Use supplies as instructed; Kit contains: (15) Saline Bullets; (15) 3x3 Gauze; 15 pr Gloves Cleanser: Vashe 5.8 (oz) 1 x Per Week/30 Days Discharge Instructions: Use vashe 5.8 (oz) as directed Topical: Gentamicin 1 x Per Week/30 Days Discharge Instructions: Apply as directed by provider. Prim Dressing: Hydrofera Blue Ready Transfer Foam, 2.5x2.5 (in/in) 1 x Per Week/30 Days ary Discharge Instructions: PACK IN WOUND Apply Hydrofera Blue Ready to wound bed as directed Secondary Dressing: Zetuvit Plus 4x4 (in/in) 1 x Per Week/30 Days Compression Wrap: Urgo K2 Lite, two layer compression system, regular 1 x Per Week/30 Days Electronic Signature(s) Signed: 05/27/2023 4:40:28 PM By: [name] Entered By: [name] on 05/27/2023 16:00:02 -------------------------------------------------------------------------------- Problem List Details Patient Name: Date of Service: [name], [name] [name] 05/27/2023 3:00  PM Medical Record Number: [id number] Patient Account Number: [id number] Date of Birth/Sex: Treating RN: 08-06-1946 (77 y.o. [name] Primary Care Provider: Jerl [name] Other Clinician: Referring Provider: Treating Provider/Extender:  [name], [name] Weeks in Treatment: 26 Active Problems ICD-10 Encounter Code Description Active Date MDM Diagnosis E10.621 Type 1 diabetes mellitus with foot ulcer 11/26/2022 No Yes I73.89 Other specified peripheral vascular diseases 11/26/2022 No Yes L89.623 Pressure ulcer of left heel, stage 3 11/26/2022 No Yes L89.613 Pressure ulcer of right heel, stage 3 11/26/2022 No Yes [name], [name] H ([id number]) [id number] Page 3 of 4 [phone] Non-pressure chronic ulcer of buttock with fat layer exposed 11/26/2022 No Yes I25.10 Atherosclerotic heart disease of native coronary artery without angina pectoris 11/26/2022 No Yes I50.42 Chronic combined systolic (congestive) and diastolic (congestive) heart failure 11/26/2022 No Yes I10 Essential (primary) hypertension 11/26/2022 No Yes Inactive Problems Resolved Problems Electronic Signature(s) Signed: 05/27/2023 3:51:28 PM By: [name] Derry [name] Entered By: [name] on 05/27/2023 15:51:28 -------------------------------------------------------------------------------- SuperBill Details Patient Name: Date of Service: [name], [name] [name] 05/27/2023 Medical Record Number: [id number] Patient Account Number: [id number] Date of Birth/Sex: Treating RN: Apr 28, 1946 (77 y.o. [name] Primary Care Provider: Jerl [name] Other Clinician: Referring Provider: Treating Provider/Extender: [name] in Treatment: 26 Diagnosis Coding ICD-10 Codes Code Description E10.621 Type 1 diabetes mellitus with foot ulcer I73.89 Other specified peripheral vascular diseases L89.623 Pressure ulcer of left heel, stage 3 L89.613 Pressure ulcer of right  heel, stage 3 L98.412 Non-pressure chronic ulcer of buttock with fat layer exposed I25.10 Atherosclerotic heart disease of native coronary artery without angina pectoris I50.42 Chronic combined systolic (congestive) and diastolic (congestive) heart failure I10 Essential (primary) hypertension Facility Procedures : CPT4 Code: 46962952 Description: (Facility Use Only) [id number] - APPLY MULTLAY COMPRS LWR LT LEG Modifier: Quantity: 1 Physician Procedures : CPT4 Code Description Modifier [id number] - WC PHYS LEVEL 3 - EST PT [name], [name] H ([id number]) [id number]. ICD-10 Diagnosis Description E10.621 Type 1 diabetes mellitus with foot ulcer I73.89 Other specified  peripheral vascular diseases L89.623 Pressure ulcer of left heel, stage 3 L89.613 Pressure ulcer of right heel, stage 3 Quantity: 1 pdf Page 4 of 4 Electronic Signature(s) Signed: 05/27/2023 5:35:49 PM By: [name] Derry [name] Previous Signature: 05/27/2023 4:26:34 PM Version By: [name] Entered By: [name] on 05/27/2023 17:35:49

## 2023-05-29 ENCOUNTER — Other Ambulatory Visit: Payer: Self-pay | Admitting: Infectious Diseases

## 2023-05-29 ENCOUNTER — Telehealth: Payer: Self-pay | Admitting: Infectious Diseases

## 2023-05-29 DIAGNOSIS — M4620 Osteomyelitis of vertebra, site unspecified: Secondary | ICD-10-CM

## 2023-05-29 NOTE — Progress Notes (Signed)
Will discuss with IR to get vertebral/disc biopsy for culture and pathology

## 2023-05-30 ENCOUNTER — Telehealth: Payer: Self-pay

## 2023-05-30 NOTE — Telephone Encounter (Signed)
-----   Message from [name] sent at 05/29/2023  8:14 PM EDT ----- Pt is to get vertebral/disc biopsy next Wednesday- HE will have to hold aspirin starting Sunday ( should not take it on Sunday, Monday and Tuesday and Wednesday). thx

## 2023-05-30 NOTE — Telephone Encounter (Signed)
I attempted to contact patient's spouse [name] to relay instructions. [name] did not answer. I Lvm with all instructions and my chart message sent. [name], CMA

## 2023-05-30 NOTE — Telephone Encounter (Signed)
I spoke to the patient's spouse [name] and advised him of patient's biopsy appointment on 06/06/23 arrive at 1 pm  per [name]. [name] also advised patient should hold aspirin Tuesday Wed, Thurs and Friday and NPO 8 hours prior to procedure per Marylu Lund with IR. [name] advised that IR will reach out to him a day or two before the appointment to go over instructions again. [name] verbalized understanding.  [name] [name]

## 2023-06-01 ENCOUNTER — Encounter: Payer: Self-pay | Admitting: Infectious Diseases

## 2023-06-02 ENCOUNTER — Other Ambulatory Visit
Admission: RE | Admit: 2023-06-02 | Discharge: 2023-06-02 | Disposition: A | Discharge status: Home / Self Care | Payer: Medicare HMO | Source: Ambulatory Visit | Attending: Infectious Diseases | Admitting: Infectious Diseases

## 2023-06-02 ENCOUNTER — Other Ambulatory Visit: Payer: Self-pay | Admitting: Infectious Diseases

## 2023-06-02 ENCOUNTER — Ambulatory Visit: Payer: Medicare HMO | Admitting: Internal Medicine

## 2023-06-02 DIAGNOSIS — M4620 Osteomyelitis of vertebra, site unspecified: Secondary | ICD-10-CM | POA: Insufficient documentation

## 2023-06-02 DIAGNOSIS — A4902 Methicillin resistant Staphylococcus aureus infection, unspecified site: Secondary | ICD-10-CM | POA: Insufficient documentation

## 2023-06-02 LAB — CBC WITH DIFFERENTIAL/PLATELET
Abs Immature Granulocytes: 0.08 10*3/uL — ABNORMAL HIGH (ref 0.00–0.07)
Basophils Absolute: 0.1 10*3/uL (ref 0.0–0.1)
Basophils Relative: 1 %
Eosinophils Absolute: 0 10*3/uL (ref 0.0–0.5)
Eosinophils Relative: 0 %
HCT: 34.7 % — ABNORMAL LOW (ref 39.0–52.0)
Hemoglobin: 11.5 g/dL — ABNORMAL LOW (ref 13.0–17.0)
Immature Granulocytes: 1 %
Lymphocytes Relative: 10 %
Lymphs Abs: 0.9 10*3/uL (ref 0.7–4.0)
MCH: 31.4 pg (ref 26.0–34.0)
MCHC: 33.1 g/dL (ref 30.0–36.0)
MCV: 94.8 fL (ref 80.0–100.0)
Monocytes Absolute: 0.6 10*3/uL (ref 0.1–1.0)
Monocytes Relative: 7 %
Neutro Abs: 7.1 10*3/uL (ref 1.7–7.7)
Neutrophils Relative %: 81 %
Platelets: 399 10*3/uL (ref 150–400)
RBC: 3.66 MIL/uL — ABNORMAL LOW (ref 4.22–5.81)
RDW: 17.1 % — ABNORMAL HIGH (ref 11.5–15.5)
WBC: 8.7 10*3/uL (ref 4.0–10.5)
nRBC: 0 % (ref 0.0–0.2)

## 2023-06-02 LAB — COMPREHENSIVE METABOLIC PANEL
ALT: 24 U/L (ref 0–44)
AST: 31 U/L (ref 15–41)
Albumin: 2.6 g/dL — ABNORMAL LOW (ref 3.5–5.0)
Alkaline Phosphatase: 245 U/L — ABNORMAL HIGH (ref 38–126)
Anion gap: 17 — ABNORMAL HIGH (ref 5–15)
BUN: 34 mg/dL — ABNORMAL HIGH (ref 8–23)
CO2: 15 mmol/L — ABNORMAL LOW (ref 22–32)
Calcium: 8.8 mg/dL — ABNORMAL LOW (ref 8.9–10.3)
Chloride: 99 mmol/L (ref 98–111)
Creatinine, Ser: 1.24 mg/dL (ref 0.61–1.24)
GFR, Estimated: >60 mL/min (ref >60)
Glucose, Bld: 293 mg/dL — ABNORMAL HIGH (ref 70–99)
Potassium: 4.7 mmol/L (ref 3.5–5.1)
Sodium: 131 mmol/L — ABNORMAL LOW (ref 135–145)
Total Bilirubin: 0.9 mg/dL (ref 0.3–1.2)
Total Protein: 5.7 g/dL — ABNORMAL LOW (ref 6.5–8.1)

## 2023-06-02 LAB — SEDIMENTATION RATE: Sed Rate: 28 mm/hr — ABNORMAL HIGH (ref 0–20)

## 2023-06-02 LAB — C-REACTIVE PROTEIN: CRP: 10.9 mg/dL — ABNORMAL HIGH (ref <1.0)

## 2023-06-02 MED ORDER — DOXYCYCLINE MONOHYDRATE 100 MG PO TABS: 100.0000 mg | ORAL_TABLET | Freq: Two times a day (BID) | ORAL | 0 refills | Status: AC

## 2023-06-02 NOTE — Progress Notes (Signed)
Spoke to Patient and his partner- has worsening back pain and and swelling legs ( chronic) left more than rt not involving the left knee. No fever, BP fine Today he has wound care appt He is having Vertebral /disc biopsy on Friday for culture and pathology- need to r/o MRSA osteomyelitis He will get labs today  including blood culture. CMP. CBC. ESR. CRP He has been off doxy for a week and as pain worsening will have to restart after ;labs have been sent

## 2023-06-02 NOTE — Telephone Encounter (Signed)
Received voicemail from [name] (8:04 am) to follow up on MyChart message. Message has been forwarded to [name].   Sandie Ano, RN

## 2023-06-03 ENCOUNTER — Telehealth: Payer: Self-pay | Admitting: Cardiovascular Disease

## 2023-06-03 ENCOUNTER — Telehealth: Payer: Self-pay

## 2023-06-03 ENCOUNTER — Encounter: Payer: Medicare HMO | Admitting: Internal Medicine

## 2023-06-03 DIAGNOSIS — Z85828 Personal history of other malignant neoplasm of skin: Secondary | ICD-10-CM | POA: Diagnosis not present

## 2023-06-03 DIAGNOSIS — L89613 Pressure ulcer of right heel, stage 3: Secondary | ICD-10-CM | POA: Diagnosis not present

## 2023-06-03 DIAGNOSIS — Z87891 Personal history of nicotine dependence: Secondary | ICD-10-CM | POA: Diagnosis not present

## 2023-06-03 DIAGNOSIS — E10621 Type 1 diabetes mellitus with foot ulcer: Secondary | ICD-10-CM | POA: Diagnosis not present

## 2023-06-03 DIAGNOSIS — I5042 Chronic combined systolic (congestive) and diastolic (congestive) heart failure: Secondary | ICD-10-CM | POA: Diagnosis not present

## 2023-06-03 DIAGNOSIS — I110 Hypertensive heart disease with heart failure: Secondary | ICD-10-CM | POA: Diagnosis not present

## 2023-06-03 DIAGNOSIS — L89623 Pressure ulcer of left heel, stage 3: Secondary | ICD-10-CM | POA: Diagnosis not present

## 2023-06-03 DIAGNOSIS — L98412 Non-pressure chronic ulcer of buttock with fat layer exposed: Secondary | ICD-10-CM | POA: Diagnosis not present

## 2023-06-03 DIAGNOSIS — R339 Retention of urine, unspecified: Secondary | ICD-10-CM | POA: Diagnosis not present

## 2023-06-03 DIAGNOSIS — L97422 Non-pressure chronic ulcer of left heel and midfoot with fat layer exposed: Secondary | ICD-10-CM | POA: Diagnosis not present

## 2023-06-03 DIAGNOSIS — I2510 Atherosclerotic heart disease of native coronary artery without angina pectoris: Secondary | ICD-10-CM | POA: Diagnosis not present

## 2023-06-03 LAB — CULTURE, BLOOD (ROUTINE X 2): Special Requests: ADEQUATE

## 2023-06-03 NOTE — Telephone Encounter (Signed)
Pt c/o swelling/edema: STAT if pt has developed SOB within 24 hours  If swelling, where is the swelling located?   All over body  How much weight have you gained and in what time span?  Yes but patient has not been able to get on a scale  Have you gained 2 pounds in a day or 5 pounds in a week?   5 lbs in a week  Do you have a log of your daily weights (if so, list)?   Not as much, patient fluctuating between 183-188 lbs  Are you currently taking a fluid pill?   Yes  Are you currently SOB?  No  Have you traveled recently in a car or plane for an extended period of time?   No  Partner stated patient is having a bone biopsy on Friday and has swelling all over his body since Friday of last week.  Partner stated patient took 2 fluid pills and 1 potassium and patient is not urinating as much.  Partner stated patient is very immobile.

## 2023-06-03 NOTE — Telephone Encounter (Signed)
Patient's spouse called stating patient has some fluid retention and was advised by wound care that he has some fluid retention and will need to increase his lasix to 2 tablets a day along with taking potassium. HH is also collecting a urine specimen to check for UTI.  Patient's spouse [name] wanted to let you know and to make sure this will affect getting the bone scan Friday.  Dhriti Fales T Pricilla Loveless

## 2023-06-03 NOTE — Progress Notes (Signed)
Richarda Overlie, MD sent to [facility] PROCEDURE / BIOPSY REVIEW Date: 05/30/23  Requested Biopsy site: T11-T12 Reason for request: Wants culture and pathology Imaging review: MRI and PET CT  Decision: Approved Imaging modality to perform: CT Schedule with: Moderate Sedation Schedule for: Any VIR  Additional comments:  Ravishankar wants a biopsy of the disc/bone rather than just an aspiration. Left 12th rib area on MRI image 10/206 and PET image 70/4   Or could biopsy right lateral disc / bone material on PET CT image 69/4.  Please contact me with questions, concerns, or if issue pertaining to this request arise.  Arn Medal, MD [facility]

## 2023-06-03 NOTE — Telephone Encounter (Signed)
Pt's partner called stating pt has been experiencing visual swelling x 10 days. He stated swelling is in abdomen and bilateral legs, arms, and hands.  He reported pt previously had MRSA in left knee and that knee is much more swollen. He denies SOB and stated he is unable to weigh pt as he isn't mobile to safety stand.  He stated pt saw wound nurse today who recommended pt take 40 mg lasix today and 1 potassium. However, pt's partner noted pt is not making a lot of urine. He also reported pt has a urinalysis pending to determine If pt has a UTI.   Will forward to MD for recommendations

## 2023-06-04 ENCOUNTER — Other Ambulatory Visit: Payer: Medicare HMO

## 2023-06-04 LAB — CULTURE, BLOOD (ROUTINE X 2)

## 2023-06-04 NOTE — Telephone Encounter (Signed)
Pt's partner calling back to f/u on yesterdays call as well as make Dr. Mariah Milling aware of a few things. Partner states that pt's PCP has prescribed him Cipro due to abnormalities in his urine. Partner also states that he gave pt 2 Lasix 20 MG today due to excess fluid and swelling. Although he states that pt's swelling has come down a little. He would like a callback regarding this matter. Please advise

## 2023-06-04 NOTE — Progress Notes (Addendum)
KLINTON, CANDELAS ([id number]) [id number] Page 1 of 7 Visit Report for 06/03/2023 Arrival Information Details Patient Name: Date of Service: [name], [name] 06/03/2023 10:45 A M Medical Record Number: [id number] Patient Account Number: [id number] Date of Birth/Sex: Treating RN: 1946/09/20 (77 y.o. Arthur Holms Primary Care Tanisa Lagace: [name] Other Clinician: Referring Patsy Varma: Treating Arael Piccione/Extender: RO BSO Dorris Carnes, MICHA EL Randol Kern in Treatment: 27 Visit Information History Since Last Visit Has Dressing in Place as Prescribed: Yes Patient Arrived: Ambulatory Has Compression in Place as Prescribed: Yes Arrival Time: 11:00 Pain Present Now: Yes Accompanied By: partner Transfer Assistance: None Patient Identification Verified: Yes Secondary Verification Process Completed: Yes Patient Requires Transmission-Based Precautions: No Patient Has Alerts: Yes Patient Alerts: Type I Diabetic eliquis ABI/TBI normal see scan Electronic Signature(s) Signed: 06/04/2023 10:01:53 AM By: Elliot Gurney, BSN, RN, CWS, Kim RN, BSN Entered By: Elliot Gurney, BSN, RN, CWS, Kim on 06/03/2023 11:26:40 -------------------------------------------------------------------------------- Complex / Palliative Patient Assessment Details Patient Name: Date of Service: [name], [name] 06/03/2023 10:45 A M Medical Record Number: [id number] Patient Account Number: [id number] Date of Birth/Sex: Treating RN: 01/04/1946 (77 y.o. Arthur Holms Primary Care Lucindia Lemley: [name] Other Clinician: Referring Earlin Sweeden: Treating Dejane Scheibe/Extender: RO BSO N, MICHA EL Randol Kern in Treatment: 27 Complex Wound Management Criteria Patient has remarkable or complex co-morbidities requiring medications or treatments that extend wound healing times. Examples: Diabetes mellitus with chronic renal failure or end stage renal disease requiring dialysis Advanced or poorly  controlled rheumatoid arthritis Diabetes mellitus and end stage chronic obstructive pulmonary disease Active cancer with current chemo- or radiation therapy DM, patient is not well Palliative Wound Management Criteria Wisz, Constant H ([id number]) [id number] Page 2 of 7 Care Approach Wound Care Plan: Complex Wound Management Electronic Signature(s) Signed: 06/06/2023 7:35:01 AM By: Elliot Gurney, BSN, RN, CWS, Kim RN, BSN Signed: 06/06/2023 2:26:28 PM By: Baltazar Najjar MD Entered By: Elliot Gurney, BSN, RN, CWS, Kim on 06/06/2023 07:35:00 -------------------------------------------------------------------------------- Compression Therapy Details Patient Name: Date of Service: [name], [name] 06/03/2023 10:45 A M Medical Record Number: [id number] Patient Account Number: [id number] Date of Birth/Sex: Treating RN: 08/19/1946 (77 y.o. Arthur Holms Primary Care Avynn Klassen: [name] Other Clinician: Referring Maliyah Willets: Treating Betsabe Iglesia/Extender: RO BSO Dorris Carnes, MICHA EL Randol Kern in Treatment: 27 Compression Therapy Performed for Wound Assessment: Wound #5 Left Calcaneus Performed By: Clinician Huel Coventry, RN Compression Type: Double Layer Post Procedure Diagnosis Same as Pre-procedure Electronic Signature(s) Signed: 06/04/2023 10:01:53 AM By: Elliot Gurney, BSN, RN, CWS, Kim RN, BSN Entered By: Elliot Gurney, BSN, RN, CWS, Kim on 06/03/2023 11:30:46 -------------------------------------------------------------------------------- Lower Extremity Assessment Details Patient Name: Date of Service: [name], [name] 06/03/2023 10:45 A M Medical Record Number: [id number] Patient Account Number: [id number] Date of Birth/Sex: Treating RN: Sep 03, 1946 (76 y.o. Arthur Holms Primary Care Kenyada Dosch: [name] Other Clinician: Referring Shyenne Maggard: Treating Jayon Matton/Extender: RO BSO Dorris Carnes, MICHA EL Randol Kern in Treatment: 27 Edema Assessment Assessed: [Left:  No] [Right: No] [Left: Edema] [Right: :] Calf Left: Right: Point of Measurement: 34 cm From Medial Instep 31 cm Berton, Izic H ([id number]) [phone]) [id number].pdf Page 3 of 7 Ankle Left: Right: Point of Measurement: 11 cm From Medial Instep 24 cm Vascular Assessment Pulses: Dorsalis Pedis Palpable: [Left:Yes] Electronic Signature(s) Signed: 06/04/2023 10:01:53 AM By: Elliot Gurney, BSN, RN, CWS, Kim RN, BSN Entered By: Elliot Gurney, BSN, RN, CWS, Kim on 06/03/2023 11:14:12 -------------------------------------------------------------------------------- Multi Wound Chart Details Patient Name: Date of Service: [name], CLA  UDE H. 06/03/2023 10:45 A M Medical Record Number: [id number] Patient Account Number: [id number] Date of Birth/Sex: Treating RN: 02-Jul-1946 (77 y.o. Arthur Holms Primary Care Rosalba Totty: [name] Other Clinician: Referring Helder Crisafulli: Treating Dametri Ozburn/Extender: RO BSO N, MICHA EL Randol Kern in Treatment: 27 Vital Signs Height(in): 69 Pulse(bpm): 109 Weight(lbs): 190 Blood Pressure(mmHg): 112/64 Body Mass Index(BMI): 28.1 Temperature(F): 97.7 Respiratory Rate(breaths/min): 18 [5:Photos:] [N/A:N/A] Left Calcaneus N/A N/A Wound Location: Pressure Injury N/A N/A Wounding Event: Diabetic Wound/Ulcer of the Lower N/A N/A Primary Etiology: Extremity Pressure Ulcer N/A N/A Secondary Etiology: Coronary Artery Disease, N/A N/A Comorbid History: Hypertension, Type I Diabetes, Osteoarthritis, Received Chemotherapy 10/20/2022 N/A N/A Date Acquired: 33 N/A N/A Weeks of Treatment: Open N/A N/A Wound Status: No N/A N/A Wound Recurrence: 1x1.4x1.6 N/A N/A Measurements L x W x D (cm) 1.1 N/A N/A A (cm) : rea 1.759 N/A N/A Volume (cm) : 76.70% N/A N/A % Reduction in A rea: -273.50% N/A N/A % Reduction in Volume: 8 Starting Position 1 (o'clock): 1 Ending Position 1 (o'clock): [place], Xaiden H ([id number])  [phone].pdf Page 4 of 7 1.6 Maximum Distance 1 (cm): Yes N/A N/A Undermining: Grade 2 N/A N/A Classification: Medium N/A N/A Exudate A mount: Serosanguineous N/A N/A Exudate Type: red, brown N/A N/A Exudate Color: Large (67-100%) N/A N/A Granulation A mount: Red N/A N/A Granulation Quality: Small (1-33%) N/A N/A Necrotic A mount: Fat Layer (Subcutaneous Tissue): Yes N/A N/A Exposed Structures: None N/A N/A Epithelialization: Treatment Notes Electronic Signature(s) Signed: 06/04/2023 10:01:53 AM By: Elliot Gurney, BSN, RN, CWS, Kim RN, BSN Entered By: Elliot Gurney, BSN, RN, CWS, Kim on 06/03/2023 11:29:05 -------------------------------------------------------------------------------- Multi-Disciplinary Care Plan Details Patient Name: Date of Service: [name], [name] 06/03/2023 10:45 A M Medical Record Number: [id number] Patient Account Number: [id number] Date of Birth/Sex: Treating RN: Aug 15, 1946 (77 y.o. Arthur Holms Primary Care Orvie Caradine: [name] Other Clinician: Referring Lonell Stamos: Treating Mahala Rommel/Extender: RO BSO N, MICHA EL Randol Kern in Treatment: 27 Active Inactive Pressure Nursing Diagnoses: Potential for impaired tissue integrity related to pressure, friction, moisture, and shear Goals: Patient will remain free of pressure ulcers Date Initiated: 11/26/2022 Target Resolution Date: 06/10/2023 Goal Status: Active Interventions: Assess: immobility, friction, shearing, incontinence upon admission and as needed Assess offloading mechanisms upon admission and as needed Assess potential for pressure ulcer upon admission and as needed Notes: Electronic Signature(s) Signed: 06/03/2023 4:57:16 PM By: Elliot Gurney, BSN, RN, CWS, Kim RN, BSN Entered By: Elliot Gurney, BSN, RN, CWS, Kim on 06/03/2023 16:57:16 Buehner, [name] ([id number]) [id number] Page 5 of  7 -------------------------------------------------------------------------------- Pain Assessment Details Patient Name: Date of Service: [name], [name] 06/03/2023 10:45 A M Medical Record Number: [id number] Patient Account Number: [id number] Date of Birth/Sex: Treating RN: 12-10-1945 (78 y.o. Arthur Holms Primary Care Annjeanette Sarwar: [name] Other Clinician: Referring Raechell Singleton: Treating Madai Nuccio/Extender: RO BSO N, MICHA EL Randol Kern in Treatment: 27 Active Problems Location of Pain Severity and Description of Pain Patient Has Paino Yes Site Locations Pain Location: Generalized Pain Rate the pain. Current Pain Level: 10 Character of Pain Describe the Pain: Heavy, Sharp Pain Management and Medication Current Pain Management: Notes back pain Electronic Signature(s) Signed: 06/04/2023 10:01:53 AM By: Elliot Gurney, BSN, RN, CWS, Kim RN, BSN Entered By: Elliot Gurney, BSN, RN, CWS, Kim on 06/03/2023 11:28:08 -------------------------------------------------------------------------------- Patient/Caregiver Education Details Patient Name: Date of Service: [name], [name] 7/23/2024andnbsp10:45 A BENIAH, MAGNAN ([id number]) [id number] Page 6 of 7 Medical Record Number: [id number] Patient  Account Number: [id number] Date of Birth/Gender: Treating RN: Dec 17, 1945 (77 y.o. Arthur Holms Primary Care Physician: [name] Other Clinician: Referring Physician: Treating Physician/Extender: RO BSO Dorris Carnes, MICHA EL Randol Kern in Treatment: 27 Education Assessment Education Provided To: Patient Education Topics Provided Wound/Skin Impairment: Handouts: Caring for Your Ulcer, Other: CONTINUE wound care as prescribed Electronic Signature(s) Signed: 06/04/2023 10:01:53 AM By: Elliot Gurney, BSN, RN, CWS, Kim RN, BSN Entered By: Elliot Gurney, BSN, RN, CWS, Kim on 06/03/2023  16:57:42 -------------------------------------------------------------------------------- Wound Assessment Details Patient Name: Date of Service: [name], [name] 06/03/2023 10:45 A M Medical Record Number: [id number] Patient Account Number: [id number] Date of Birth/Sex: Treating RN: [birth year] (77 y.o. Loel Lofty, Selena Batten Primary Care Brayant Dorr: [name] Other Clinician: Referring Kamylah Manzo: Treating Nakya Weyand/Extender: RO BSO N, MICHA EL Randol Kern in Treatment: 27 Wound Status Wound Number: 5 Primary Diabetic Wound/Ulcer of the Lower Extremity Etiology: Wound Location: Left Calcaneus Secondary Pressure Ulcer Wounding Event: Pressure Injury Etiology: Date Acquired: 10/20/2022 Wound Open Weeks Of Treatment: 27 Status: Clustered Wound: No Comorbid Coronary Artery Disease, Hypertension, Type I Diabetes, History: Osteoarthritis, Received Chemotherapy Photos Wound Measurements Length: (cm) 1 Width: (cm) 1.4 Depth: (cm) 1.6 Area: (cm) 1.1 Volume: (cm) 1.7 Brizuela, Tadd H ([id number]) % Reduction in Area: 76.7% % Reduction in Volume: -273.5% Epithelialization: None Undermining: Yes 59 Starting Position (o'clock): 8 Ending Position (o'clock): 1 [id number] Page 7 of 7 Maximum Distance: (cm) 1.6 Wound Description Classification: Grade 2 Exudate Amount: Medium Exudate Type: Serosanguineous Exudate Color: red, brown Foul Odor After Cleansing: No Slough/Fibrino Yes Wound Bed Granulation Amount: Large (67-100%) Exposed Structure Granulation Quality: Red Fat Layer (Subcutaneous Tissue) Exposed: Yes Necrotic Amount: Small (1-33%) Necrotic Quality: Adherent Scientist, physiological) Signed: 06/04/2023 10:01:53 AM By: Elliot Gurney, BSN, RN, CWS, Kim RN, BSN Entered By: Elliot Gurney, BSN, RN, CWS, Kim on 06/03/2023 11:12:38 -------------------------------------------------------------------------------- Vitals Details Patient Name: Date  of Service: [name], [name] 06/03/2023 10:45 A M Medical Record Number: [id number] Patient Account Number: [id number] Date of Birth/Sex: Treating RN: 25-Sep-1946 (77 y.o. Arthur Holms Primary Care Jerrine Urschel: [name] Other Clinician: Referring Irmalee Riemenschneider: Treating Berdie Malter/Extender: RO BSO N, MICHA EL Randol Kern in Treatment: 27 Vital Signs Time Taken: 11:15 Temperature (F): 97.7 Height (in): 69 Pulse (bpm): 109 Weight (lbs): 190 Respiratory Rate (breaths/min): 18 Body Mass Index (BMI): 28.1 Blood Pressure (mmHg): 112/64 Reference Range: 80 - 120 mg / dl Electronic Signature(s) Signed: 06/04/2023 10:01:53 AM By: Elliot Gurney, BSN, RN, CWS, Kim RN, BSN Entered By: Elliot Gurney, BSN, RN, CWS, Kim on 06/03/2023 11:27:14

## 2023-06-04 NOTE — Telephone Encounter (Signed)
Called and spoke with partner. Partner said that he just wants to make sure that Dr. Mariah Milling is aware that the patient was started on Cipro.

## 2023-06-04 NOTE — Progress Notes (Signed)
[name], [name] ([id number]) [id number] Page 1 of 9 Visit Report for 06/03/2023 HPI Details Patient Name: Date of Service: [name], [name] 06/03/2023 10:45 A M Medical Record Number: [id number] Patient Account Number: [id number] Date of Birth/Sex: Treating [name]: [birth year] (77 y.o. [name] Primary Care Provider: [name] Other Clinician: Referring Provider: Treating Provider/Extender: Chauncey Mann, MICHA EL Randol Kern in Treatment: 27 History of Present Illness HPI Description: 77 year old male recently seen by his PCP Dr. [name] who saw him for a wound on the right leg which is less tender and he is continuing to use bacitracin and has completed her course of doxycycline. Past medical history of coronary artery disease, diabetes mellitus type 1, hyperlipidemia, hypertension, MI, plantar fascial fibromatosis, pneumonia, status post foot surgery due to trauma on the left side, coronary angioplasty. he is a former smoker and quit in July 1996. last hemoglobin A1c was 7.8 % in April of this year 5/22 2017 -- biopsy of the right lower extremity was sent for pathology and the report is that of a basal cell carcinoma with edges of the biopsy are involved. I have called the patient over the phone( 03/28/16) and discussed the pathology report with him and he is agreeable to see a surgeon for excision and need full. I have also spoken personally to [name], of [facility] surgical and referred the patient for further wide excision of this lesion and have communicated this to the patient. READMISSION 08/12/18 This is a now 77 year old man with type 1 diabetes. Hemoglobin A1c on 4/29 was 7.9. He is listed as having a history of PAD in the [facility] records although the patient doesn't recall this, doesn't complain of any symptoms of claudication and doesn't believe he has had any prior noninvasive arterial test. He is a nonsmoker. He was here  in 2017 with a wound on his Right lower leg. This was biopsied in the clinic and pathology showed a basal cell carcinoma. He went on to have surgery on this area this is currently where I believe his current wounds are located. He generally bumped the lateral part of his right calf 2 weeks ago and has a superficial abrasion. He also has 2 small open areas on the medial part of the tibia in the same area. Our intake nurse noted a stitch coming out of this which was removed. These of the wound sees actually come to the clinic for. However the more concerning area is an area on the tip of the left great toe. He says that this was initially traumatic and he's been to see [name]. [name] been using what I think is Santyl to the wound tip. This has some depth. ABIs in this clinic were non-obtainable on the right and 1.84 on the left. 08/19/18; the patient had arterial studies that did not show evidence of significant hemodynamically compromising occlusions in either leg. There was mild medial atherosclerosis bilaterally. His resting ABIs were within the normal limits at 1.3 on the right and 1.4 on the left. There was normal posterior and anterior tibial artery waveforms and velocities listed on both sides. The patient arrives with what appears to be a healthy surface over the tip of his left great toe. This is indeed surprising. Still looks somewhat friable however. More concerning is the x-ray that we did that showed erosions of the tuft of the distal phalanx the left great toe consistent with osteomyelitis. I attempted to pull this x-ray  up on colon health Link however I can't open the film to look at this myself. 08/26/18; I reviewed the patient's x-ray and colon helpline. Indeed there is fairly obvious erosion of the tip of his left great toe. The wound was initially a traumatic area hitting it sometime in the middle of night in his kitchen. He is a type I diabetic. I have him on Flagyl  and Levaquin that I started last week i.e. 7 days ago. He has an appointment with infectious disease on 09/01/18 09/02/18; the patient was seen by [name] of infectious disease. She did not feel the patient needed IV antibiotics. I do feel he needs further oral antibiotics however. He does not need anymore Flagyl but he does need another 2 weeks of Levaquin. The areas just about closed. 09/16/18; the patient is completing his Levaquin today. There is no open wound on the tip of the toe Readmission: 11-26-2022 upon evaluation today patient presents for initial inspection here in our clinic concerning issues that he has been having after having been hospitalized due to septic arthritis. Subsequently he has a PICC line that is been removed and he has been discharged from infectious disease in that regard. Unfortunately he developed shearing wounds over the gluteal region as well as a pressure ulcer over each heel although they are not looking extremely bad nonetheless they do seem to be evidence of pressure which she sustained while hospitalized. Obviously he was very sick and is still recovering as far as that is concerned. Fortunately there does not appear to be any evidence of active infection systemically which is good news at this point according to Dr. [name]. Patient's wounds also do not appear to be too bad at this point which is good news. Patient does have a history of type 1 diabetes mellitus, peripheral vascular disease, coronary artery disease, congestive heart failure, hypertension, and he is on long-term anticoagulant therapy. 12-03-2022 upon evaluation today patient appears to be doing better to some degree in regard to his ulcers on his heels at this point. We are still waiting on the actual appointment with the vascular surgeons but nonetheless in the meantime I do believe that he is making some good progress currently with regard to loosen up the eschar. I did actually feel  like that there was some ability for [place] to remove some of the necrotic tissue today and I discussed that with the patient. He is in agreement with that plan. We also can probably see about making an adjustment in the treatment regimen. 12-10-2022 upon evaluation today patient's wounds were really doing about the same. Again he is still awaiting the evaluation with the vascular surgeon. That FINCH, COSTANZO ([id number]) [id number] Page 2 of 9 should have already been done but had to be pushed back unfortunately. He will be seeing them next week on the seventh. I plan to see him following somewhere around the end of the week I think will probably be best. The patient voiced understanding. 12-19-2022 upon evaluation today patient appears to be doing well currently in regard to his wound we did get the results of his arterial studies which show that he has excellent ABIs with no signs of vascular compromise. For that reason I will go ahead and perform some debridement today to clearway the necrotic debris with his Eliquis that I will be too aggressive so this will still be a stepwise process but we are to go ahead and get that started. 12-27-2022 upon evaluation today  patient appears to be doing well currently in regard to his wounds. They are actually showing signs of excellent improvement. There is some necrotic tissue noted on the surface of wounds to work on clearing some of that away today also I think that we may switch to [facility] dressing to see how things do going forward. He is in agreement with that plan. Subsequently I am extremely pleased with where we stand currently. No fevers, chills, nausea, vomiting, or diarrhea. 01-02-2023 upon evaluation today patient appears to be doing much better in regard to his wounds the right foot appears to be almost healed the left foot though not completely healed is significantly better. Fortunately there does not appear to  be any signs of active infection at this point. 01-10-2023 upon evaluation today patient appears to be doing well currently in regard to his wounds. The right heel actually is completely healed the left heel is definitely headed in the right direction and looks to be doing awesome. I do not see any signs of active infection locally nor systemically at this time which is great news. 3/7; his right heel has remained healed. There is no evidence of active infection. The remaining wound is on the lateral aspect of the heel just above the plantar surface 01-30-2023 upon evaluation today patient appears to be doing well currently in regard to his wound. The heel actually showing signs of improvement which is great news and fortunately I do not see any evidence of active infection locally nor systemically at this time. Overall I think that we are headed in the right direction which is excellent here. No fevers, chills, nausea, vomiting, or diarrhea. I am good have to perform some sharp debridement today. This is just the left heel that is remaining open. 02-06-2023 upon evaluation today patient appears to be doing well currently in regard to his wound. Has been tolerating the dressing changes without complication. Fortunately there does not appear to be any signs of active infection locally nor systemically although he does have some need for sharp debridement with some definite necrotic tissue at the base of the wound today. 02-13-2023 upon evaluation today patient appears to be doing well currently in regard to his wound which I feel like is slowly clearing up. I did review his growth labs as well the sed rate and C-reactive protein are down his white blood cell count was normal his hemoglobin is coming up. He did see Dr. Joylene Draft on Tuesday and she did continue him on the doxycycline considering his lab numbers but nonetheless he seems to be making some pretty good progress here in my opinion. The wound is  slowly turnaround serial debridements are obviously helping with that being said I do believe that he is going to still require dressing changes and close monitoring. 02-20-2023 upon evaluation today patient's wound actually appears to be making some slight progress. This is slowly but surely moving into the right direction. Fortunately I do not see any signs of active infection at this time. 02-27-2023 upon evaluation today patient appears to be doing well currently in regard to his wound. He has been tolerating the dressing changes. I think the Hydrofera Blue is doing a good job and he seems to be doing well with that currently. 03-06-2023 upon evaluation today patient appears to be doing well currently in regard to his wounds were actually seeing signs of improvement which is great news. Fortunately I do not see any evidence of active infection locally nor  systemically which is great news and overall I do believe that we are moving in the right direction. 03-13-2023 upon evaluation today patient's wound is actually showing signs of improvement. We actually do have a snap VAC with the bridge today and I am hopeful that this will actually be beneficial for him. Will give this a shot and see how things do. 03-17-2023 upon evaluation today patient comes in early due to having issues with the snap VAC will need to not maintain a seal. Subsequently we did end up seeing an and removing the snap VAC due to the fact that there is a lot of maceration and drainage around the wound itself. Upon removal however the wound appears to be doing excellent and in fact was in much better shape that even when I saw her last Thursday. 03-20-2023 upon evaluation today patient appears to be doing well currently in regard to his wound is showing more signs of granulation which is great news and overall I am extremely pleased with where we stand today. I do not see any evidence of active infection locally nor systemically which is  great news. No fevers, chills, nausea, vomiting, or diarrhea. 03-28-2023 upon evaluation today patient appears to be doing well currently in regard to his wound although he unfortunately is again macerated. I think a big portion of this is his leg swelling they were unable to really use the juxta lite appropriately over top of the snap VAC. For that reason his leg is extremely swollen as he has not really had any compression on over the past week. This obviously is not good and I discussed that with the patient today as well. I would recommend we take a break for a week yet again and I think that at this point we may need to actually compression wrap him. If organ to continue with the snap VAC I think he needs to be underneath the compression wrap. 04-03-2023 upon evaluation today patient actually appears to be making good progress in my opinion in regard to his leg wound more specifically on the heel. He has been tolerating the dressing changes without complication and I actually feel like with the compression wrapping not only is his leg smaller but this looks much better compared to last week's evaluation. Fortunately I do not see any evidence of active infection locally nor systemically which is great news. 04-10-2023 upon evaluation today patient appears to be doing well currently in regard to his wound. He is actually tolerating the dressing changes without complication and very pleased in that regard. Fortunately I do not see any signs of active infection at this time. I do think organ to try to go back to the snap VAC today with the compression wrap. 04-15-2023 upon evaluation today patient appears to be doing well currently in regard to his wound. The snap VAC seems to be doing an excellent job. I am actually extremely pleased with where we stand. 04-22-2023 upon evaluation today patient appears to be doing well currently in regard to his wound which is actually showing signs of improvement.  Fortunately I do not see any evidence of active infection locally nor systemically which is great news. No fevers, chills, nausea, vomiting, or diarrhea. 04-29-2023 upon evaluation today patient appears to be doing well currently in regard to his heel is showing signs of excellent improvement and actually very pleased with how things are doing with the snap VAC. 05-06-2023 upon evaluation today patient appears to be doing well with  regard to his wound he is showing signs of improvement and very pleased in that regard. Fortunately I do not see any evidence of active infection locally or systemically which is great news and overall I am pleased with where things stand at this point. 05-13-2023 upon evaluation today patient appears to be doing well currently in regard to his wound although there is some hypergranulation. I do think that there may need to be some debridement here to clearway some hypergranular tissue I discussed that with the patient and he is in agreement with that plan. Subsequently I think this will allow for more appropriate healing and things to continue to move along. 05-20-2023 upon evaluation today patient appears to be doing well currently in regard to his wound although he did have quite a bit of bleeding after last week's debridement. Unfortunately the snap VAC I am not sure he is really helping a lot with improving the overall status of the depth of the wound compared to where we were previous. At 1 point he was really seeing a lot of improvement week by week at this point I feel like that somewhat stalled unfortunately. With [name], [name] ([id number]) [id number] Page 3 of 9 that being said I discussed with the patient today that we may want to try something a little different not utilizing the snap VAC today. He did do very well previously when we use the compression wrap and the [facility] I think that is what we want to give a try here as  well. 05-27-2023 upon evaluation today patient appears to be doing better in regard to the heel ulcer. I think that at this point he is actually seeing better improvement with regard to not using the snap VAC that he was when he was using a snap VAC. The good news is I do not see any signs of active infection at this time which is great news. 7/23; this patient has a refractory area on the left heel. We have been using Hydrofera Blue. He did not respond well to the snap VAC He comes in today with his partner reporting increasing edema and decreasing mobility over the last 2 weeks. It sounds as though he takes a small dose of Lasix every other day. He is also being followed by infectious disease for a possible discitis. I have not reviewed these records. 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 06/03/2023 10:45 A M Medical Record Number: [id number] Patient Account Number: [id number] Date of Birth/Sex: Treating [name]: 1945/11/22 (77 y.o. [name] Primary Care Provider: [name] Other Clinician: Referring Provider: Treating Provider/Extender: RO BSO N, MICHA EL Randol Kern in Treatment: 27 Constitutional Sitting or standing Blood Pressure is within target range for patient.. Slightly tachycardic. Temperature is normal and within the target range for the patient.. He does not look acutely ill however he appears to be edematous. Respiratory Respiratory effort is easy and symmetric bilaterally. Rate is normal at rest and on room air.. Cardiovascular Soft pansystolic murmur no gallops however his JVp is elevated. Widespread pitting edema in his lower back and pelvis area.. Notes Wound exam; left  lateral heel this is not strictly a weightbearing surface. Tissue looks somewhat macerated but otherwise fairly clean no evidence of infection. No purulent drainage Electronic Signature(s) Signed: 06/03/2023  4:43:46 PM By: [name] Najjar MD Entered By: [name] on 06/03/2023 11:59:25 -------------------------------------------------------------------------------- Physician Orders Details Patient Name: Date of Service: [name], [name] 06/03/2023 10:45 A M Medical Record Number: [id number] Patient Account Number: [id number] Date of Birth/Sex: Treating [name]: [birth year] (77 y.o. [name], [name], [place] [facility] ([id number]) [id number] Page 4 of 9 Primary Care Provider: [name] Other Clinician: Referring Provider: Treating Provider/Extender: RO BSO N, MICHA EL Randol Kern in Treatment: 27 Verbal / Phone Orders: No Diagnosis Coding Follow-up Appointments Return Appointment in 1 week. Nurse Visit as needed - to check wrap and re-wrap Bathing/ Shower/ Hygiene May shower with wound dressing protected with water repellent cover or cast protector. No tub bath. - no soaking foot in tub Anesthetic (Use 'Patient Medications' Section for Anesthetic Order Entry) Lidocaine applied to wound bed Edema Control - Lymphedema / Segmental Compressive Device / Other Patient to wear own compression stockings. Remove compression stockings every night before going to bed and put on every morning when getting up. - pt has juxtafit velcro wrap-ON HOLD AT THIS TIME Elevate, Exercise Daily and A void Standing for Long Periods of Time. Elevate legs to the level of the heart and pump ankles as often as possible Elevate leg(s) parallel to the floor when sitting. DO YOUR BEST to sleep in the bed at night. DO NOT sleep in your recliner. Long hours of sitting in a recliner leads to swelling of the legs and/or potential wounds on your backside. Additional  Orders / Instructions Go to Emergency Department of your choice for evaluation and treatment. - Follow-up with cardiology today or go to ED for evaluation and treatment of fluid overload. Other: Medications-Please add to medication list. Other: - Call Cardiologist to increase fluid pill. Wound Treatment Wound #5 - Calcaneus Wound Laterality: Left Cleanser: Byram Ancillary Kit - 15 Day Supply (Generic) 1 x Per Week/30 Days Discharge Instructions: Use supplies as instructed; Kit contains: (15) Saline Bullets; (15) 3x3 Gauze; 15 pr Gloves Cleanser: Vashe 5.8 (oz) 1 x Per Week/30 Days Discharge Instructions: Use vashe 5.8 (oz) as directed Prim Dressing: Hydrofera Blue Ready Transfer Foam, 2.5x2.5 (in/in) 1 x Per Week/30 Days ary Discharge Instructions: PACK IN WOUND Apply Hydrofera Blue Ready to wound bed as directed Secondary Dressing: ABD Pad 5x9 (in/in) 1 x Per Week/30 Days Discharge Instructions: Cover with ABD pad Compression Wrap: Urgo K2 Lite, two layer compression system, regular 1 x Per Week/30 Days Electronic Signature(s) Signed: 06/03/2023 4:43:46 PM By: [name] Najjar MD Signed: 06/04/2023 10:01:53 AM By: [name], BSN, [name], CWS, [name], [name] Entered By: [name], BSN, [name], CWS, [name] on 06/03/2023 11:47:53 -------------------------------------------------------------------------------- Problem List Details Patient Name: Date of Service: [name], [name] 06/03/2023 10:45 A M Medical Record Number: [id number] Patient Account Number: [id number] Date of Birth/Sex: Treating [name]: 11/19/1945 (77 y.o. [name] Primary Care Provider: [name] Other Clinician: OMAR, GAYDEN ([id number]) [id number] Page 5 of 9 Referring Provider: Treating Provider/Extender: RO BSO N, MICHA EL Randol Kern in Treatment: 27 Active Problems ICD-10 Encounter Code Description Active Date MDM Diagnosis E10.621 Type 1 diabetes mellitus with foot ulcer  11/26/2022 No Yes I73.89 Other specified peripheral vascular diseases 11/26/2022 No Yes L89.623 Pressure ulcer of left heel, stage 3 11/26/2022 No Yes L89.613 Pressure ulcer of right heel, stage 3 11/26/2022 No Yes L98.412 Non-pressure chronic ulcer of buttock with fat layer exposed 11/26/2022 No Yes I25.10 Atherosclerotic heart disease of native coronary artery without angina pectoris  11/26/2022 No Yes I50.42 Chronic combined systolic (congestive) and diastolic (congestive) heart failure 11/26/2022 No Yes I10 Essential (primary) hypertension 11/26/2022 No Yes Inactive Problems Resolved Problems Electronic Signature(s) Signed: 06/03/2023 4:43:46 PM By: [name] Najjar MD Entered By: [name] on 06/03/2023 11:49:45 -------------------------------------------------------------------------------- Progress Note Details Patient Name: Date of Service: [name], [name] 06/03/2023 10:45 A M Medical Record Number: [id number] Patient Account Number: [id number] Date of Birth/Sex: Treating [name]: November 04, 1946 (77 y.o. [name] Primary Care Provider: [name] Other Clinician: Referring Provider: Treating Provider/Extender: Chauncey Mann, MICHA EL Randol Kern in Treatment: [address] [name] ([id number]) [id number] Page 6 of 9 History of Present Illness (HPI) 77 year old male recently seen by his PCP Dr. [name] who saw him for a wound on the right leg which is less tender and he is continuing to use bacitracin and has completed her course of doxycycline. Past medical history of coronary artery disease, diabetes mellitus type 1, hyperlipidemia, hypertension, MI, plantar fascial fibromatosis, pneumonia, status post foot surgery due to trauma on the left side, coronary angioplasty. he is a former smoker and quit in July 1996. last hemoglobin A1c was 7.8 % in April of this year 5/22 2017 -- biopsy of the right lower extremity was sent  for pathology and the report is that of a basal cell carcinoma with edges of the biopsy are involved. I have called the patient over the phone( 03/28/16) and discussed the pathology report with him and he is agreeable to see a surgeon for excision and need full. I have also spoken personally to [name], of [facility] surgical and referred the patient for further wide excision of this lesion and have communicated this to the patient. READMISSION 08/12/18 This is a now 77 year old man with type 1 diabetes. Hemoglobin A1c on 4/29 was 7.9. He is listed as having a history of PAD in the [facility] records although the patient doesn't recall this, doesn't complain of any symptoms of claudication and doesn't believe he has had any prior noninvasive arterial test. He is a nonsmoker. He was here in 2017 with a wound on his Right lower leg. This was biopsied in the clinic and pathology showed a basal cell carcinoma. He went on to have surgery on this area this is currently where I believe his current wounds are located. He generally bumped the lateral part of his right calf 2 weeks ago and has a superficial abrasion. He also has 2 small open areas on the medial part of the tibia in the same area. Our intake nurse noted a stitch coming out of this which was removed. These of the wound sees actually come to the clinic for. However the more concerning area is an area on the tip of the left great toe. He says that this was initially traumatic and he's been to see [name]. [name] been using what I think is Santyl to the wound tip. This has some depth. ABIs in this clinic were non-obtainable on the right and 1.84 on the left. 08/19/18; the patient had arterial studies that did not show evidence of significant hemodynamically compromising occlusions in either leg. There was mild medial atherosclerosis bilaterally. His resting ABIs were within the normal limits at 1.3 on the right and 1.4 on the left. There  was normal posterior and anterior tibial artery waveforms and velocities listed on both sides. The patient arrives with what appears to be a healthy surface over the tip of his  left great toe. This is indeed surprising. Still looks somewhat friable however. More concerning is the x-ray that we did that showed erosions of the tuft of the distal phalanx the left great toe consistent with osteomyelitis. I attempted to pull this x-ray up on colon health Link however I can't open the film to look at this myself. 08/26/18; I reviewed the patient's x-ray and colon helpline. Indeed there is fairly obvious erosion of the tip of his left great toe. The wound was initially a traumatic area hitting it sometime in the middle of night in his kitchen. He is a type I diabetic. I have him on Flagyl and Levaquin that I started last week i.e. 7 days ago. He has an appointment with infectious disease on 09/01/18 09/02/18; the patient was seen by [name] of infectious disease. She did not feel the patient needed IV antibiotics. I do feel he needs further oral antibiotics however. He does not need anymore Flagyl but he does need another 2 weeks of Levaquin. The areas just about closed. 09/16/18; the patient is completing his Levaquin today. There is no open wound on the tip of the toe Readmission: 11-26-2022 upon evaluation today patient presents for initial inspection here in our clinic concerning issues that he has been having after having been hospitalized due to septic arthritis. Subsequently he has a PICC line that is been removed and he has been discharged from infectious disease in that regard. Unfortunately he developed shearing wounds over the gluteal region as well as a pressure ulcer over each heel although they are not looking extremely bad nonetheless they do seem to be evidence of pressure which she sustained while hospitalized. Obviously he was very sick and is still recovering as far as that is  concerned. Fortunately there does not appear to be any evidence of active infection systemically which is good news at this point according to Dr. [name]. Patient's wounds also do not appear to be too bad at this point which is good news. Patient does have a history of type 1 diabetes mellitus, peripheral vascular disease, coronary artery disease, congestive heart failure, hypertension, and he is on long-term anticoagulant therapy. 12-03-2022 upon evaluation today patient appears to be doing better to some degree in regard to his ulcers on his heels at this point. We are still waiting on the actual appointment with the vascular surgeons but nonetheless in the meantime I do believe that he is making some good progress currently with regard to loosen up the eschar. I did actually feel like that there was some ability for [place] to remove some of the necrotic tissue today and I discussed that with the patient. He is in agreement with that plan. We also can probably see about making an adjustment in the treatment regimen. 12-10-2022 upon evaluation today patient's wounds were really doing about the same. Again he is still awaiting the evaluation with the vascular surgeon. That should have already been done but had to be pushed back unfortunately. He will be seeing them next week on the seventh. I plan to see him following somewhere around the end of the week I think will probably be best. The patient voiced understanding. 12-19-2022 upon evaluation today patient appears to be doing well currently in regard to his wound we did get the results of his arterial studies which show that he has excellent ABIs with no signs of vascular compromise. For that reason I will go ahead and perform some debridement today to clearway the  necrotic debris with his Eliquis that I will be too aggressive so this will still be a stepwise process but we are to go ahead and get that started. 12-27-2022 upon evaluation today patient  appears to be doing well currently in regard to his wounds. They are actually showing signs of excellent improvement. There is some necrotic tissue noted on the surface of wounds to work on clearing some of that away today also I think that we may switch to [facility] dressing to see how things do going forward. He is in agreement with that plan. Subsequently I am extremely pleased with where we stand currently. No fevers, chills, nausea, vomiting, or diarrhea. 01-02-2023 upon evaluation today patient appears to be doing much better in regard to his wounds the right foot appears to be almost healed the left foot though not completely healed is significantly better. Fortunately there does not appear to be any signs of active infection at this point. 01-10-2023 upon evaluation today patient appears to be doing well currently in regard to his wounds. The right heel actually is completely healed the left heel is definitely headed in the right direction and looks to be doing awesome. I do not see any signs of active infection locally nor systemically at this time which is great news. 3/7; his right heel has remained healed. There is no evidence of active infection. The remaining wound is on the lateral aspect of the heel just above the plantar surface 01-30-2023 upon evaluation today patient appears to be doing well currently in regard to his wound. The heel actually showing signs of improvement which is great news and fortunately I do not see any evidence of active infection locally nor systemically at this time. Overall I think that we are headed in the right direction which is excellent here. No fevers, chills, nausea, vomiting, or diarrhea. I am good have to perform some sharp debridement today. This is just the left heel that is remaining open. 02-06-2023 upon evaluation today patient appears to be doing well currently in regard to his wound. Has been tolerating the dressing changes  without complication. Fortunately there does not appear to be any signs of active infection locally nor systemically although he does have some need for sharp debridement with some definite necrotic tissue at the base of the wound today. 02-13-2023 upon evaluation today patient appears to be doing well currently in regard to his wound which I feel like is slowly clearing up. I did review his growth labs as well the sed rate and C-reactive protein are down his white blood cell count was normal his hemoglobin is coming up. He did see Dr. Joylene Draft on [name], [name] ([id number]) [id number] Page 7 of 9 Tuesday and she did continue him on the doxycycline considering his lab numbers but nonetheless he seems to be making some pretty good progress here in my opinion. The wound is slowly turnaround serial debridements are obviously helping with that being said I do believe that he is going to still require dressing changes and close monitoring. 02-20-2023 upon evaluation today patient's wound actually appears to be making some slight progress. This is slowly but surely moving into the right direction. Fortunately I do not see any signs of active infection at this time. 02-27-2023 upon evaluation today patient appears to be doing well currently in regard to his wound. He has been tolerating the dressing changes. I think the Hydrofera Blue is doing a good job and he seems to be doing  well with that currently. 03-06-2023 upon evaluation today patient appears to be doing well currently in regard to his wounds were actually seeing signs of improvement which is great news. Fortunately I do not see any evidence of active infection locally nor systemically which is great news and overall I do believe that we are moving in the right direction. 03-13-2023 upon evaluation today patient's wound is actually showing signs of improvement. We actually do have a snap VAC with the bridge today and  I am hopeful that this will actually be beneficial for him. Will give this a shot and see how things do. 03-17-2023 upon evaluation today patient comes in early due to having issues with the snap VAC will need to not maintain a seal. Subsequently we did end up seeing an and removing the snap VAC due to the fact that there is a lot of maceration and drainage around the wound itself. Upon removal however the wound appears to be doing excellent and in fact was in much better shape that even when I saw her last Thursday. 03-20-2023 upon evaluation today patient appears to be doing well currently in regard to his wound is showing more signs of granulation which is great news and overall I am extremely pleased with where we stand today. I do not see any evidence of active infection locally nor systemically which is great news. No fevers, chills, nausea, vomiting, or diarrhea. 03-28-2023 upon evaluation today patient appears to be doing well currently in regard to his wound although he unfortunately is again macerated. I think a big portion of this is his leg swelling they were unable to really use the juxta lite appropriately over top of the snap VAC. For that reason his leg is extremely swollen as he has not really had any compression on over the past week. This obviously is not good and I discussed that with the patient today as well. I would recommend we take a break for a week yet again and I think that at this point we may need to actually compression wrap him. If organ to continue with the snap VAC I think he needs to be underneath the compression wrap. 04-03-2023 upon evaluation today patient actually appears to be making good progress in my opinion in regard to his leg wound more specifically on the heel. He has been tolerating the dressing changes without complication and I actually feel like with the compression wrapping not only is his leg smaller but this looks much better compared to last week's  evaluation. Fortunately I do not see any evidence of active infection locally nor systemically which is great news. 04-10-2023 upon evaluation today patient appears to be doing well currently in regard to his wound. He is actually tolerating the dressing changes without complication and very pleased in that regard. Fortunately I do not see any signs of active infection at this time. I do think organ to try to go back to the snap VAC today with the compression wrap. 04-15-2023 upon evaluation today patient appears to be doing well currently in regard to his wound. The snap VAC seems to be doing an excellent job. I am actually extremely pleased with where we stand. 04-22-2023 upon evaluation today patient appears to be doing well currently in regard to his wound which is actually showing signs of improvement. Fortunately I do not see any evidence of active infection locally nor systemically which is great news. No fevers, chills, nausea, vomiting, or diarrhea. 04-29-2023 upon evaluation today  patient appears to be doing well currently in regard to his heel is showing signs of excellent improvement and actually very pleased with how things are doing with the snap VAC. 05-06-2023 upon evaluation today patient appears to be doing well with regard to his wound he is showing signs of improvement and very pleased in that regard. Fortunately I do not see any evidence of active infection locally or systemically which is great news and overall I am pleased with where things stand at this point. 05-13-2023 upon evaluation today patient appears to be doing well currently in regard to his wound although there is some hypergranulation. I do think that there may need to be some debridement here to clearway some hypergranular tissue I discussed that with the patient and he is in agreement with that plan. Subsequently I think this will allow for more appropriate healing and things to continue to move along. 05-20-2023 upon  evaluation today patient appears to be doing well currently in regard to his wound although he did have quite a bit of bleeding after last week's debridement. Unfortunately the snap VAC I am not sure he is really helping a lot with improving the overall status of the depth of the wound compared to where we were previous. At 1 point he was really seeing a lot of improvement week by week at this point I feel like that somewhat stalled unfortunately. With that being said I discussed with the patient today that we may want to try something a little different not utilizing the snap VAC today. He did do very well previously when we use the compression wrap and the [facility] I think that is what we want to give a try here as well. 05-27-2023 upon evaluation today patient appears to be doing better in regard to the heel ulcer. I think that at this point he is actually seeing better improvement with regard to not using the snap VAC that he was when he was using a snap VAC. The good news is I do not see any signs of active infection at this time which is great news. 7/23; this patient has a refractory area on the left heel. We have been using Hydrofera Blue. He did not respond well to the snap VAC He comes in today with his partner reporting increasing edema and decreasing mobility over the last 2 weeks. It sounds as though he takes a small dose of Lasix every other day. He is also being followed by infectious disease for a possible discitis. I have not reviewed these records. He is no longer as mobile as he was Objective Constitutional Sitting or standing Blood Pressure is within target range for patient.. Slightly tachycardic. Temperature is normal and within the target range for the patient.. He does not look acutely ill however he appears to be edematous. Vitals Time Taken: 11:15 AM, Height: 69 in, Weight: 190 lbs, BMI: 28.1, Temperature: 97.7 F, Pulse: 109 bpm, Respiratory Rate: 18 breaths/min,  Blood Pressure: 112/64 mmHg. Respiratory [name], [name] ([id number]) [id number] Page 8 of 9 Respiratory effort is easy and symmetric bilaterally. Rate is normal at rest and on room air.. Cardiovascular Soft pansystolic murmur no gallops however his JVp is elevated. Widespread pitting edema in his lower back and pelvis area.. General Notes: Wound exam; left lateral heel this is not strictly a weightbearing surface. Tissue looks somewhat macerated but otherwise fairly clean no evidence of infection. No purulent drainage Integumentary (Hair, Skin) Wound #5 status is Open. Original cause  of wound was Pressure Injury. The date acquired was: 10/20/2022. The wound has been in treatment 27 weeks. The wound is located on the Left Calcaneus. The wound measures 1cm length x 1.4cm width x 1.6cm depth; 1.1cm^2 area and 1.759cm^3 volume. There is Fat Layer (Subcutaneous Tissue) exposed. There is undermining starting at 8:00 and ending at 1:00 with a maximum distance of 1.6cm. There is a medium amount of serosanguineous drainage noted. There is large (67-100%) red granulation within the wound bed. There is a small (1-33%) amount of necrotic tissue within the wound bed including Adherent Slough. Assessment Active Problems ICD-10 Type 1 diabetes mellitus with foot ulcer Other specified peripheral vascular diseases Pressure ulcer of left heel, stage 3 Pressure ulcer of right heel, stage 3 Non-pressure chronic ulcer of buttock with fat layer exposed Atherosclerotic heart disease of native coronary artery without angina pectoris Chronic combined systolic (congestive) and diastolic (congestive) heart failure Essential (primary) hypertension Procedures Wound #5 Pre-procedure diagnosis of Wound #5 is a Diabetic Wound/Ulcer of the Lower Extremity located on the Left Calcaneus . There was a Double Layer Compression Therapy Procedure by [name] Coventry, [name] Post procedure Diagnosis  Wound #5: Same as Pre-Procedure Plan Follow-up Appointments: Return Appointment in 1 week. Nurse Visit as needed - to check wrap and re-wrap Bathing/ Shower/ Hygiene: May shower with wound dressing protected with water repellent cover or cast protector. No tub bath. - no soaking foot in tub Anesthetic (Use 'Patient Medications' Section for Anesthetic Order Entry): Lidocaine applied to wound bed Edema Control - Lymphedema / Segmental Compressive Device / Other: Patient to wear own compression stockings. Remove compression stockings every night before going to bed and put on every morning when getting up. - pt has juxtafit velcro wrap-ON HOLD AT THIS TIME Elevate, Exercise Daily and Avoid Standing for Long Periods of Time. Elevate legs to the level of the heart and pump ankles as often as possible Elevate leg(s) parallel to the floor when sitting. DO YOUR BEST to sleep in the bed at night. DO NOT sleep in your recliner. Long hours of sitting in a recliner leads to swelling of the legs and/or potential wounds on your backside. Additional Orders / Instructions: Go to Emergency Department of your choice for evaluation and treatment. - Follow-up with cardiology today or go to ED for evaluation and treatment of fluid overload. Other: Medications-Please add to medication list.: Other: - Call Cardiologist to increase fluid pill. WOUND #5: - Calcaneus Wound Laterality: Left Cleanser: Byram Ancillary Kit - 15 Day Supply (Generic) 1 x Per Week/30 Days Discharge Instructions: Use supplies as instructed; Kit contains: (15) Saline Bullets; (15) 3x3 Gauze; 15 pr Gloves Cleanser: Vashe 5.8 (oz) 1 x Per Week/30 Days Discharge Instructions: Use vashe 5.8 (oz) as directed Prim Dressing: Hydrofera Blue Ready Transfer Foam, 2.5x2.5 (in/in) 1 x Per Week/30 Days ary Discharge Instructions: PACK IN WOUND Apply Hydrofera Blue Ready to wound bed as directed Secondary Dressing: ABD Pad 5x9 (in/in) 1 x Per  Week/30 Days Discharge Instructions: Cover with ABD pad Com pression Wrap: Urgo K2 Lite, two layer compression system, regular 1 x Per Week/30 Days Kern, Sim H ([id number]) [id number] Page 9 of 9 1. At this point I continued with the [facility] with regards to his heel 2. There would appear to be a lot of issues here. Currently being worked up by infectious disease for a source of infection including a bone biopsy on Friday. I did not research this 3. Equally concerning is that he  no longer appears to be able to stand and walk according to his partner. 4. Widespread edema. The elevated jugular venous pressure suggest this is of cardiac etiology. For now I have suggested doubling his Lasix and taking it daily instead of every other day. I have also suggested calling his cardiologist. 5. Although I do not disagree with the concept to be keeping people out of the hospital it would appear that this man is deteriorating from several different aspects. Electronic Signature(s) Signed: 06/03/2023 4:43:46 PM By: [name] Najjar MD Entered By: [name] on 06/03/2023 12:06:38 -------------------------------------------------------------------------------- SuperBill Details Patient Name: Date of Service: [name], [name] 06/03/2023 Medical Record Number: [id number] Patient Account Number: [id number] Date of Birth/Sex: Treating [name]: Feb 05, 1946 (77 y.o. [name], [name] Batten Primary Care Provider: [name] Other Clinician: Referring Provider: Treating Provider/Extender: RO BSO Dorris Carnes, MICHA EL Randol Kern in Treatment: 27 Diagnosis Coding ICD-10 Codes Code Description E10.621 Type 1 diabetes mellitus with foot ulcer I73.89 Other specified peripheral vascular diseases L89.623 Pressure ulcer of left heel, stage 3 L89.613 Pressure ulcer of right heel, stage 3 L98.412 Non-pressure chronic ulcer of buttock with fat layer exposed I25.10  Atherosclerotic heart disease of native coronary artery without angina pectoris I50.42 Chronic combined systolic (congestive) and diastolic (congestive) heart failure I10 Essential (primary) hypertension Facility Procedures : CPT4 Code: 69629528 Description: (Facility Use Only) [phone] - APPLY MULTLAY COMPRS LWR LT LEG Modifier: Quantity: 1 Physician Procedures : CPT4 Code Description Modifier [id number] - WC PHYS LEVEL 4 - EST PT ICD-10 Diagnosis Description E10.621 Type 1 diabetes mellitus with foot ulcer L89.623 Pressure ulcer of left heel, stage 3 Quantity: 1 Electronic Signature(s) Signed: 06/03/2023 4:43:46 PM By: [name] Najjar MD Entered By: [name] on 06/03/2023 12:07:27

## 2023-06-05 ENCOUNTER — Telehealth: Payer: Self-pay | Admitting: Cardiovascular Disease

## 2023-06-05 ENCOUNTER — Other Ambulatory Visit: Payer: Self-pay | Admitting: Radiology

## 2023-06-05 ENCOUNTER — Other Ambulatory Visit: Payer: Self-pay | Admitting: Cardiovascular Disease

## 2023-06-05 DIAGNOSIS — Z01812 Encounter for preprocedural laboratory examination: Secondary | ICD-10-CM

## 2023-06-05 LAB — CULTURE, BLOOD (ROUTINE X 2): Culture: NO GROWTH

## 2023-06-05 NOTE — Progress Notes (Signed)
Pernell Dupre, MD sent to [name] Approved, plan to do in IR. With sedation. Need to be off any thinners. Ok to do on abx as discussed.  [name]       Previous Messages    ----- Message ----- From: [name] Sent: 05/30/2023   9:55 AM EDT To: Pernell Dupre, MD Subject: ATTN: Dr Bartolo Darter Bone Biopsy              IR Approval Request:   Procedure:     IR bone/disc biopsy of T11-T12 space  Reason:        Back pain T11-T12 vertebral bone and disc changes- r/o osteomyelitis- need bone  disc biopsy for culture and pathology  History:          MR Lumbar spine wo contrast   05/13/2023  Provider:       Dr [name] Ito, MD  Provider Contact #      [facility]  [phone]

## 2023-06-05 NOTE — Telephone Encounter (Signed)
Called back over to patients partner. He states that he is just trying to figure out what needs to be done in regards to the potassium tablet. For now, until he gets the bone biopsy to know further of what is going on- patient is taking the potassium 20 mg daily, he did increase it a few days ago (see previous messages). However they are down to 1 tablet left of the furosemide, needing a refill if okay to refill, and also how to take the potassium. I did advise that the instructions on the potassium say to take if he needs to take the furosemide, at this time he has been giving him 1 tablet (20 mg) of furosemide, and 1 tablet of the potassium a day. He would like to make sure Dr.Gollan is okay with this.   Will route to MD for recommendations.  Thanks!

## 2023-06-05 NOTE — Progress Notes (Signed)
Patient for IR Bone Biopsy on Friday 06/06/2023, I called and spoke with the patient's partner, Luisa Hart on the phone and gave pre-procedure instructions. Luisa Hart was made aware to have the patient here at 1pm, last dose of ASA 81mg  was Monday 06/02/2023, NPO after MN prior to procedure as well as driver post procedure/recovery/discharge. Luisa Hart stated understanding.  Called 06/05/2023

## 2023-06-05 NOTE — Telephone Encounter (Signed)
Luisa Hart informed patient is ok to get bone biopsy.

## 2023-06-05 NOTE — Telephone Encounter (Signed)
Pt c/o medication issue:  1. Name of Medication:   potassium chloride SA (KLOR-CON M) 20 MEQ tablet   2. How are you currently taking this medication (dosage and times per day)?   As prescribed  3. Are you having a reaction (difficulty breathing--STAT)?   No  4. What is your medication issue?   Partner wants to know if patient's potassium should be increased.

## 2023-06-05 NOTE — Telephone Encounter (Signed)
*  STAT* If patient is at the pharmacy, call can be transferred to refill team.   1. Which medications need to be refilled? (please list name of each medication and dose if known)   furosemide (LASIX) 20 MG tablet   2. Would you like to learn more about the convenience, safety, & potential cost savings by using the [facility]?    3. Are you open to using the [facility] (Type [facility]. ).   4. Which pharmacy/location (including street and city if local pharmacy) is medication to be sent to?  [id number] - [place], [place] - [address] AT [facility] OF [address]   5. Do they need a 30 day or 90 day supply?   90 day  Partner stated patient only has 1 tablet left.

## 2023-06-06 ENCOUNTER — Ambulatory Visit
Admission: RE | Admit: 2023-06-06 | Discharge: 2023-06-06 | Disposition: A | Discharge status: Home / Self Care | Payer: Medicare HMO | Source: Ambulatory Visit | Attending: Infectious Diseases | Admitting: Infectious Diseases

## 2023-06-06 ENCOUNTER — Encounter: Payer: Self-pay | Admitting: Radiology

## 2023-06-06 ENCOUNTER — Encounter: Payer: Self-pay | Admitting: Cardiovascular Disease

## 2023-06-06 ENCOUNTER — Other Ambulatory Visit: Payer: Self-pay | Admitting: Infectious Diseases

## 2023-06-06 ENCOUNTER — Other Ambulatory Visit: Payer: Self-pay

## 2023-06-06 DIAGNOSIS — Z01812 Encounter for preprocedural laboratory examination: Secondary | ICD-10-CM

## 2023-06-06 DIAGNOSIS — M4620 Osteomyelitis of vertebra, site unspecified: Secondary | ICD-10-CM | POA: Insufficient documentation

## 2023-06-06 DIAGNOSIS — M899 Disorder of bone, unspecified: Secondary | ICD-10-CM | POA: Diagnosis not present

## 2023-06-06 HISTORY — PX: IR CT BONE TROCAR/NEEDLE BIOPSY DEEP: IMG941

## 2023-06-06 LAB — GLUCOSE, CAPILLARY
Glucose-Capillary: 350 mg/dL — ABNORMAL HIGH (ref 70–99)
Glucose-Capillary: 362 mg/dL — ABNORMAL HIGH (ref 70–99)

## 2023-06-06 LAB — PROTIME-INR
INR: 1.1 (ref 0.8–1.2)
Prothrombin Time: 13.9 seconds (ref 11.4–15.2)

## 2023-06-06 MED ORDER — FENTANYL CITRATE (PF) 100 MCG/2ML IJ SOLN
INTRAMUSCULAR | Status: AC
  Filled 2023-06-06: qty 2

## 2023-06-06 MED ORDER — FENTANYL CITRATE (PF) 100 MCG/2ML IJ SOLN
INTRAMUSCULAR | Status: AC | PRN
  Administered 2023-06-06 (×2): 25 ug via INTRAVENOUS
  Administered 2023-06-06: 50 ug via INTRAVENOUS

## 2023-06-06 MED ORDER — MIDAZOLAM HCL 2 MG/2ML IJ SOLN
INTRAMUSCULAR | Status: AC
  Filled 2023-06-06: qty 4

## 2023-06-06 MED ORDER — LIDOCAINE HCL (PF) 1 % IJ SOLN
INTRAMUSCULAR | Status: AC
  Filled 2023-06-06: qty 30

## 2023-06-06 MED ORDER — WHITE PETROLATUM EX OINT
TOPICAL_OINTMENT | CUTANEOUS | Status: AC
  Filled 2023-06-06: qty 5

## 2023-06-06 MED ORDER — LIDOCAINE HCL (PF) 1 % IJ SOLN
20.0000 mL | Freq: Once | INTRAMUSCULAR | Status: AC
  Administered 2023-06-06: 15 mL via INTRADERMAL

## 2023-06-06 MED ORDER — SODIUM CHLORIDE 0.9 % IV SOLN: INTRAVENOUS | Status: DC

## 2023-06-06 MED ORDER — MIDAZOLAM HCL 2 MG/2ML IJ SOLN
INTRAMUSCULAR | Status: AC | PRN
  Administered 2023-06-06: 2 mg via INTRAVENOUS
  Administered 2023-06-06 (×2): .5 mg via INTRAVENOUS

## 2023-06-06 NOTE — Sedation Documentation (Signed)
1500 patient lying prone on IR table and moved right arm positioning and dislodged PIV.
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
## 2023-06-06 NOTE — Procedures (Signed)
Interventional Radiology Procedure Note  Date of Procedure: 06/06/2023  Procedure: IR bone biopsy of T11/T12   Findings:  1. Successful 14ga core biopsy of T12 superior endplate and T11/T12 disc space    Complications: No immediate complications noted.   Estimated Blood Loss: minimal  Follow-up and Recommendations: 1. Bedrest 1 hour  2. Samples sent for pathology, microbiology (bacterial, fungal)    Olive Bass, MD  Vascular & Interventional Radiology  06/06/2023 4:06 PM

## 2023-06-09 ENCOUNTER — Encounter: Payer: Medicare HMO | Admitting: Physician Assistant

## 2023-06-09 DIAGNOSIS — L89623 Pressure ulcer of left heel, stage 3: Secondary | ICD-10-CM | POA: Diagnosis not present

## 2023-06-09 DIAGNOSIS — I110 Hypertensive heart disease with heart failure: Secondary | ICD-10-CM | POA: Diagnosis not present

## 2023-06-09 DIAGNOSIS — L98412 Non-pressure chronic ulcer of buttock with fat layer exposed: Secondary | ICD-10-CM | POA: Diagnosis not present

## 2023-06-09 DIAGNOSIS — E1065 Type 1 diabetes mellitus with hyperglycemia: Secondary | ICD-10-CM | POA: Diagnosis not present

## 2023-06-09 DIAGNOSIS — E109 Type 1 diabetes mellitus without complications: Secondary | ICD-10-CM | POA: Diagnosis not present

## 2023-06-09 DIAGNOSIS — I5042 Chronic combined systolic (congestive) and diastolic (congestive) heart failure: Secondary | ICD-10-CM | POA: Diagnosis not present

## 2023-06-09 DIAGNOSIS — I2510 Atherosclerotic heart disease of native coronary artery without angina pectoris: Secondary | ICD-10-CM | POA: Diagnosis not present

## 2023-06-09 DIAGNOSIS — Z87891 Personal history of nicotine dependence: Secondary | ICD-10-CM | POA: Diagnosis not present

## 2023-06-09 DIAGNOSIS — L89613 Pressure ulcer of right heel, stage 3: Secondary | ICD-10-CM | POA: Diagnosis not present

## 2023-06-09 DIAGNOSIS — Z85828 Personal history of other malignant neoplasm of skin: Secondary | ICD-10-CM | POA: Diagnosis not present

## 2023-06-09 DIAGNOSIS — E10621 Type 1 diabetes mellitus with foot ulcer: Secondary | ICD-10-CM | POA: Diagnosis not present

## 2023-06-09 NOTE — Progress Notes (Signed)
[name], [name] ([id number]) [id number] Page 1 of 4 Visit Report for 06/09/2023 Chief Complaint Document Details Patient Name: Date of Service: [name] RTERFIELD, [name] 06/09/2023 2:30 PM Medical Record Number: [id number] Patient Account Number: [id number] Date of Birth/Sex: Treating RN: 1946-01-01 (77 y.o. [name]) [name] Primary Care Provider: [name] Other Clinician: Referring Provider: Treating Provider/Extender: [name] in Treatment: 27 Information Obtained from: Patient Chief Complaint Left heel pressure ulcer Electronic Signature(s) Signed: 06/09/2023 3:04:21 PM By: [name] [name] Entered By: [name] on 06/09/2023 15:04:21 -------------------------------------------------------------------------------- Physician Orders Details Patient Name: Date of Service: [name] RTERFIELD, [name] 06/09/2023 2:30 PM Medical Record Number: [id number] Patient Account Number: [id number] Date of Birth/Sex: Treating RN: July 31, 1946 (77 y.o. [name]) [name] Primary Care Provider: [name] Other Clinician: Referring Provider: Treating Provider/Extender: [name] in Treatment: 27 Verbal / Phone Orders: No Diagnosis Coding ICD-10 Coding Code Description E10.621 Type 1 diabetes mellitus with foot ulcer I73.89 Other specified peripheral vascular diseases L89.623 Pressure ulcer of left heel, stage 3 L89.613 Pressure ulcer of right heel, stage 3 L98.412 Non-pressure chronic ulcer of buttock with fat layer exposed I25.10 Atherosclerotic heart disease of native coronary artery without angina pectoris I50.42 Chronic combined systolic (congestive) and diastolic (congestive) heart failure I10 Essential (primary) hypertension [name] ([id number]) [id number] Page 2 of 4 Follow-up Appointments Return Appointment in 1 week. Bathing/ Shower/ Hygiene May shower with  wound dressing protected with water repellent cover or cast protector. No tub bath. - no soaking foot in tub Anesthetic (Use 'Patient Medications' Section for Anesthetic Order Entry) Lidocaine applied to wound bed Edema Control - Lymphedema / Segmental Compressive Device / Other Patient to wear own compression stockings. Remove compression stockings every night before going to bed and put on every morning when getting up. - pt has juxtafit velcro wrap-ON HOLD AT THIS TIME Elevate, Exercise Daily and A void Standing for Long Periods of Time. Elevate legs to the level of the heart and pump ankles as often as possible Elevate leg(s) parallel to the floor when sitting. DO YOUR BEST to sleep in the bed at night. DO NOT sleep in your recliner. Long hours of sitting in a recliner leads to swelling of the legs and/or potential wounds on your backside. Additional Orders / Instructions Go to Emergency Department of your choice for evaluation and treatment. - Follow-up with cardiology today or go to ED for evaluation and treatment of fluid overload. Other: Medications-Please add to medication list. Other: - Call Cardiologist to increase fluid pill. Wound Treatment Wound #5 - Calcaneus Wound Laterality: Left Cleanser: Byram Ancillary Kit - 15 Day Supply (DME) (Generic) 3 x Per Week/30 Days Discharge Instructions: Use supplies as instructed; Kit contains: (15) Saline Bullets; (15) 3x3 Gauze; 15 pr Gloves Cleanser: Vashe 5.8 (oz) 3 x Per Week/30 Days Discharge Instructions: Use vashe 5.8 (oz) as directed Prim Dressing: Hydrofera Blue Ready Transfer Foam, 2.5x2.5 (in/in) (Generic) 3 x Per Week/30 Days ary Discharge Instructions: PACK IN WOUND Apply Hydrofera Blue Ready to wound bed as directed Secondary Dressing: ABD Pad 5x9 (in/in) (DME) (Generic) 3 x Per Week/30 Days Discharge Instructions: Cover with ABD pad Secured With: Medipore T - 44M Medipore [name] Soft Cloth Surgical T ape ape, 2x2 (in/yd) (DME)  (Generic) 3 x Per Week/30 Days Secured With: [employer] Sterile or Non-Sterile 6-ply 4.5x4 (yd/yd) (DME) (Generic) 3 x Per Week/30 Days Discharge Instructions: Apply Kerlix as directed Secured With: Tubigrip Size D, 3x10 (  in/yd) 3 x Per Week/30 Days Discharge Instructions: single layer Electronic Signature(s) Unsigned Entered By: [name] on 06/09/2023 15:24:25 -------------------------------------------------------------------------------- Problem List Details Patient Name: Date of Service: [name] RTERFIELD, [name] 06/09/2023 2:30 PM Medical Record Number: [id number] Patient Account Number: [id number] Date of Birth/Sex: Treating RN: [birth year] (76 y.o. [name] Primary Care Provider: [name] Other Clinician: Referring Provider: Treating Provider/Extender: Albie, Cordray ([id number]) [id number] Page 3 of 4 Weeks in Treatment: 27 Active Problems ICD-10 Encounter Code Description Active Date MDM Diagnosis E10.621 Type 1 diabetes mellitus with foot ulcer 11/26/2022 No Yes I73.89 Other specified peripheral vascular diseases 11/26/2022 No Yes L89.623 Pressure ulcer of left heel, stage 3 11/26/2022 No Yes L89.613 Pressure ulcer of right heel, stage 3 11/26/2022 No Yes L98.412 Non-pressure chronic ulcer of buttock with fat layer exposed 11/26/2022 No Yes I25.10 Atherosclerotic heart disease of native coronary artery without angina pectoris 11/26/2022 No Yes I50.42 Chronic combined systolic (congestive) and diastolic (congestive) heart failure 11/26/2022 No Yes I10 Essential (primary) hypertension 11/26/2022 No Yes Inactive Problems Resolved Problems Electronic Signature(s) Signed: 06/09/2023 3:04:13 PM By: [name] [name] Entered By: [name] on 06/09/2023 15:04:13 -------------------------------------------------------------------------------- SuperBill Details Patient Name: Date of Service: [name] RTERFIELD, [name] 06/09/2023 Medical Record Number: [id number] Patient Account Number: [id number] Date of Birth/Sex: Treating RN: 08-09-1946 (77 y.o. [name]) [name] Primary Care Provider: [name] Other Clinician: Referring Provider: Treating Provider/Extender: [name] in Treatment: [address] Diagnosis Coding Kronenberger, [name] ([id number]) [id number] Page 4 of 4 ICD-10 Codes Code Description E10.621 Type 1 diabetes mellitus with foot ulcer I73.89 Other specified peripheral vascular diseases L89.623 Pressure ulcer of left heel, stage 3 L89.613 Pressure ulcer of right heel, stage 3 L98.412 Non-pressure chronic ulcer of buttock with fat layer exposed I25.10 Atherosclerotic heart disease of native coronary artery without angina pectoris I50.42 Chronic combined systolic (congestive) and diastolic (congestive) heart failure I10 Essential (primary) hypertension Facility Procedures : CPT4 Code: [id number] Description: 66063 - WOUND CARE VISIT-LEV 2 EST PT Modifier: Quantity: 1 Electronic Signature(s) Unsigned Entered ByYevonne [name] on 06/09/2023 15:12:58 Signature(s): Date(s):

## 2023-06-09 NOTE — Telephone Encounter (Signed)
Called and spoke with Luisa Hart per DPR. Informed him of the following recommendations from Dr. Mariah Milling.  Lab work shows potassium is starting to trend higher  Would decrease potassium down to 10 mill equivalents rather than 20 mill equivalents with each Lasix pill  Thx  TGollan   Patrick verbalizes understanding.

## 2023-06-09 NOTE — Telephone Encounter (Signed)
Called and spoke with Luisa Hart per DPR. Informed him of the following recommendations from Dr. Mariah Milling.  Messages received, lab work reviewed  Would recommend he take Lasix daily rather than every other day for at least a week to see if we can get the swelling down  Renal function should be able to tolerate this  After 1 week wouldl try to go back to every other day with extra Lasix as needed  Do not forget leg elevation, compression hose for lymphedema  Albumin /protein level is low 2.7 which can contribute to swelling  Need to continue to work on nutrition  Blood count looks stable  Thx  TGollan   Patrick verbalizes understanding.

## 2023-06-10 DIAGNOSIS — S91302A Unspecified open wound, left foot, initial encounter: Secondary | ICD-10-CM | POA: Diagnosis not present

## 2023-06-10 DIAGNOSIS — L98412 Non-pressure chronic ulcer of buttock with fat layer exposed: Secondary | ICD-10-CM | POA: Diagnosis not present

## 2023-06-10 DIAGNOSIS — L89620 Pressure ulcer of left heel, unstageable: Secondary | ICD-10-CM | POA: Diagnosis not present

## 2023-06-10 DIAGNOSIS — L89610 Pressure ulcer of right heel, unstageable: Secondary | ICD-10-CM | POA: Diagnosis not present

## 2023-06-10 DIAGNOSIS — E10621 Type 1 diabetes mellitus with foot ulcer: Secondary | ICD-10-CM | POA: Diagnosis not present

## 2023-06-11 ENCOUNTER — Emergency Department
Admission: EM | Admit: 2023-06-11 | Discharge: 2023-06-12 | Disposition: E | Payer: Medicare HMO | Attending: Emergency Medicine | Admitting: Emergency Medicine

## 2023-06-11 DIAGNOSIS — I469 Cardiac arrest, cause unspecified: Secondary | ICD-10-CM

## 2023-06-11 DIAGNOSIS — I499 Cardiac arrhythmia, unspecified: Secondary | ICD-10-CM | POA: Diagnosis not present

## 2023-06-11 DIAGNOSIS — R569 Unspecified convulsions: Secondary | ICD-10-CM | POA: Diagnosis not present

## 2023-06-11 DIAGNOSIS — R404 Transient alteration of awareness: Secondary | ICD-10-CM | POA: Diagnosis not present

## 2023-06-11 DIAGNOSIS — R739 Hyperglycemia, unspecified: Secondary | ICD-10-CM | POA: Diagnosis not present

## 2023-06-11 LAB — CBG MONITORING, ED: Glucose-Capillary: 271 mg/dL — ABNORMAL HIGH (ref 70–99)

## 2023-06-11 MED ORDER — CALCIUM GLUCONATE 10 % IV SOLN
1.0000 g | Freq: Once | INTRAVENOUS | Status: AC
  Administered 2023-06-11: 1 g via INTRAVENOUS

## 2023-06-11 MED ORDER — CALCIUM GLUCONATE-NACL 1-0.675 GM/50ML-% IV SOLN: 1.0000 g | Freq: Once | INTRAVENOUS | Status: DC

## 2023-06-11 MED ORDER — EPINEPHRINE PF 1 MG/ML IJ SOLN
1.0000 mg | Freq: Once | INTRAMUSCULAR | Status: AC
  Administered 2023-06-11: 1 mg via INTRAVENOUS

## 2023-06-12 NOTE — Progress Notes (Signed)
GENO, DEBROUX ([id number]) [id number] Page 1 of 9 Visit Report for 06/09/2023 Arrival Information Details Patient Name: Date of Service: [name], [name] 06/09/2023 2:30 PM Medical Record Number: [id number] Patient Account Number: [id number] Date of Birth/Sex: Treating RN: 10-23-1946 (76 y.o. [name]) [name] Primary Care [name]: [name] Other Clinician: Referring [name]: Treating [name]/Extender: [name] in Treatment: 27 Visit Information History Since Last Visit Added or deleted any medications: No Patient Arrived: Ambulatory Any new allergies or adverse reactions: No Arrival Time: 14:39 Had a fall or experienced change in No Accompanied By: partner activities of daily living that may affect Transfer Assistance: None risk of falls: Patient Identification Verified: Yes Signs or symptoms of abuse/neglect since last visito No Secondary Verification Process Completed: Yes Hospitalized since last visit: No Patient Requires Transmission-Based Precautions: No Implantable device outside of the clinic excluding No Patient Has Alerts: Yes cellular tissue based products placed in the center Patient Alerts: Type I Diabetic since last visit: eliquis Has Dressing in Place as Prescribed: Yes ABI/TBI normal see scan Pain Present Now: No Electronic Signature(s) Signed: 06/08/2023 4:21:14 PM By: [name] Pax RN Entered By: [name] on 06/09/2023 15:24:35 -------------------------------------------------------------------------------- Clinic Level of Care Assessment Details Patient Name: Date of Service: [name], [name] 06/09/2023 2:30 PM Medical Record Number: [id number] Patient Account Number: [id number] Date of Birth/Sex: Treating RN: 1946/08/11 (76 y.o. [name]) [name] Primary Care [name]: [name] Other Clinician: Referring [name]: Treating [name]/Extender: [name] in Treatment: 27 Clinic Level of Care Assessment Items TOOL 4 Quantity Score X- 1 0 Use when only an EandM is performed on FOLLOW-UP visit ASSESSMENTS - Nursing Assessment / Reassessment X- 1 10 Reassessment of Co-morbidities (includes updates in patient status) X- 1 5 Reassessment of Adherence to Treatment Plan [name], [name] ([id number]) [id number] Page 2 of 9 ASSESSMENTS - Wound and Skin A ssessment / Reassessment X - Simple Wound Assessment / Reassessment - one wound 1 5 []  - 0 Complex Wound Assessment / Reassessment - multiple wounds []  - 0 Dermatologic / Skin Assessment (not related to wound area) ASSESSMENTS - Focused Assessment []  - 0 Circumferential Edema Measurements - multi extremities []  - 0 Nutritional Assessment / Counseling / Intervention []  - 0 Lower Extremity Assessment (monofilament, tuning fork, pulses) []  - 0 Peripheral Arterial Disease Assessment (using hand held doppler) ASSESSMENTS - Ostomy and/or Continence Assessment and Care []  - 0 Incontinence Assessment and Management []  - 0 Ostomy Care Assessment and Management (repouching, etc.) PROCESS - Coordination of Care X - Simple Patient / Family Education for ongoing care 1 15 []  - 0 Complex (extensive) Patient / Family Education for ongoing care []  - 0 Staff obtains Chiropractor, Records, T Results / Process Orders est []  - 0 Staff telephones HHA, Nursing Homes / Clarify orders / etc []  - 0 Routine Transfer to another Facility (non-emergent condition) []  - 0 Routine Hospital Admission (non-emergent condition) []  - 0 New Admissions / Manufacturing engineer / Ordering NPWT Apligraf, etc. , []  - 0 Emergency Hospital Admission (emergent condition) X- 1 10 Simple Discharge Coordination []  - 0 Complex (extensive) Discharge Coordination PROCESS - Special Needs []  - 0 Pediatric / Minor Patient Management []  - 0 Isolation Patient Management []  -  0 Hearing / Language / Visual special needs []  - 0 Assessment of Community assistance (transportation, D/C planning, etc.) []  - 0 Additional assistance / Altered mentation []  - 0 Support Surface(s) Assessment (bed, cushion, seat, etc.)  INTERVENTIONS - Wound Cleansing / Measurement X - Simple Wound Cleansing - one wound 1 5 []  - 0 Complex Wound Cleansing - multiple wounds X- 1 5 Wound Imaging (photographs - any number of wounds) []  - 0 Wound Tracing (instead of photographs) X- 1 5 Simple Wound Measurement - one wound []  - 0 Complex Wound Measurement - multiple wounds INTERVENTIONS - Wound Dressings X - Small Wound Dressing one or multiple wounds 1 10 []  - 0 Medium Wound Dressing one or multiple wounds []  - 0 Large Wound Dressing one or multiple wounds []  - 0 Application of Medications - topical []  - 0 Application of Medications - injection INTERVENTIONS - Miscellaneous []  - 0 External ear exam [name], [name] ([id number]) [id number] Page 3 of 9 []  - 0 Specimen Collection (cultures, biopsies, blood, body fluids, etc.) []  - 0 Specimen(s) / Culture(s) sent or taken to Lab for analysis []  - 0 Patient Transfer (multiple staff / Michiel Sites Lift / Similar devices) []  - 0 Simple Staple / Suture removal (25 or less) []  - 0 Complex Staple / Suture removal (26 or more) []  - 0 Hypo / Hyperglycemic Management (close monitor of Blood Glucose) []  - 0 Ankle / Brachial Index (ABI) - do not check if billed separately X- 1 5 Vital Signs Has the patient been seen at the hospital within the last three years: Yes Total Score: 75 Level Of Care: New/Established - Level 2 Electronic Signature(s) Signed: 06/02/2023 4:21:14 PM By: [name] Pax RN Entered By: [name] on 06/09/2023 15:12:52 -------------------------------------------------------------------------------- Encounter Discharge Information Details Patient Name: Date of Service: [name], [name]  [name] 06/09/2023 2:30 PM Medical Record Number: [id number] Patient Account Number: [id number] Date of Birth/Sex: Treating RN: September 25, 1946 (76 y.o. Melonie [name] Primary Care [name]: [name] Other Clinician: Referring [name]: Treating Montell Leopard/Extender: [name] in Treatment: 27 Encounter Discharge Information Items Discharge Condition: Stable Ambulatory Status: Wheelchair Discharge Destination: Home Transportation: Private Auto Accompanied By: partner Schedule Follow-up Appointment: Yes Clinical Summary of Care: Electronic Signature(s) Signed: 06/04/2023 4:21:14 PM By: [name] Pax RN Entered By: [name] on 06/09/2023 15:13:57 -------------------------------------------------------------------------------- Lower Extremity Assessment Details Patient Name: Date of Service: [name], [name] 06/09/2023 2:30 PM Lata, Ayham [name] ([id number]) [id number] Page 4 of 9 Medical Record Number: [id number] Patient Account Number: [id number] Date of Birth/Sex: Treating RN: 09/14/1946 (76 y.o. [name]) [name] Primary Care Nasha Diss: [name] Other Clinician: Referring Eliakim Tendler: Treating Alben Jepsen/Extender: [name] in Treatment: 27 Edema Assessment Assessed: [Left: No] [Right: No] Edema: [Left: Ye] [Right: s] Calf Left: Right: Point of Measurement: 34 cm From Medial Instep 34 cm Ankle Left: Right: Point of Measurement: 11 cm From Medial Instep 24 cm Vascular Assessment Pulses: Dorsalis Pedis Palpable: [Left:Yes] Extremity colors, hair growth, and conditions: Extremity Color: [Left:Normal] Hair Growth on Extremity: [Left:No] Temperature of Extremity: [Left:Warm] Capillary Refill: [Left:< 3 seconds] Dependent Rubor: [Left:No] Blanched when Elevated: [Left:No No] Toe Nail Assessment Left: Right: Thick: Yes Discolored: Yes Deformed: Yes Improper Length and Hygiene:  Yes Electronic Signature(s) Signed: 06/09/2023 4:21:14 PM By: [name] Pax RN Entered By: [name] on 06/09/2023 14:57:42 -------------------------------------------------------------------------------- Multi Wound Chart Details Patient Name: Date of Service: [name], [name] 06/09/2023 2:30 PM Medical Record Number: [id number] Patient Account Number: [id number] Date of Birth/Sex: Treating RN: 05/06/1946 (76 y.o. Melonie [name] Primary Care Eris Breck: [name] Other Clinician: Referring Phila Shoaf: Treating Claretta Kendra/Extender: [name] in Treatment: 27 Vital Signs Height(in): Pulse(bpm): 108  Weight(lbs): Blood Pressure(mmHg): 117/71 Body Mass Index(BMI): Temperature(F): 97.8 Respiratory Rate(breaths/min): 18 [name], [name] ([id number]) [id number] Page 5 of 9 [5:Photos:] [N/A:N/A] Left Calcaneus N/A N/A Wound Location: Pressure Injury N/A N/A Wounding Event: Diabetic Wound/Ulcer of the Lower N/A N/A Primary Etiology: Extremity Pressure Ulcer N/A N/A Secondary Etiology: Coronary Artery Disease, N/A N/A Comorbid History: Hypertension, Type I Diabetes, Osteoarthritis, Received Chemotherapy 10/20/2022 N/A N/A Date Acquired: 69 N/A N/A Weeks of Treatment: Open N/A N/A Wound Status: No N/A N/A Wound Recurrence: 1x1x1.2 N/A N/A Measurements L x W x D (cm) 0.785 N/A N/A A (cm) : rea 0.942 N/A N/A Volume (cm) : 83.30% N/A N/A % Reduction in A rea: -100.00% N/A N/A % Reduction in Volume: Grade 2 N/A N/A Classification: Medium N/A N/A Exudate A mount: Serosanguineous N/A N/A Exudate Type: red, brown N/A N/A Exudate Color: Large (67-100%) N/A N/A Granulation A mount: Red N/A N/A Granulation Quality: Small (1-33%) N/A N/A Necrotic A mount: Fat Layer (Subcutaneous Tissue): Yes N/A N/A Exposed Structures: None N/A N/A Epithelialization: Treatment Notes Electronic Signature(s) Signed:  06/05/2023 4:21:14 PM By: [name] Pax RN Entered By: [name] on 06/09/2023 14:57:49 -------------------------------------------------------------------------------- Multi-Disciplinary Care Plan Details Patient Name: Date of Service: [name], [name] 06/09/2023 2:30 PM Medical Record Number: [id number] Patient Account Number: [id number] Date of Birth/Sex: Treating RN: [birth year] (76 y.o. Melonie [name] Primary Care Dewain Platz: [name] Other Clinician: Referring Fawnda Vitullo: Treating Leyan Branden/Extender: [name] in Treatment: 27 Active Inactive Pressure Nursing Diagnoses: Potential for impaired tissue integrity related to pressure, friction, moisture, and shear [name], [name] ([id number]) [id number] Page 6 of 9 Goals: Patient will remain free of pressure ulcers Date Initiated: 11/26/2022 Target Resolution Date: 06/10/2023 Goal Status: Active Interventions: Assess: immobility, friction, shearing, incontinence upon admission and as needed Assess offloading mechanisms upon admission and as needed Assess potential for pressure ulcer upon admission and as needed Notes: Electronic Signature(s) Signed: 06/07/2023 4:21:14 PM By: [name] Pax RN Entered By: [name] on 06/09/2023 14:58:05 -------------------------------------------------------------------------------- Pain Assessment Details Patient Name: Date of Service: [name], [name] 06/09/2023 2:30 PM Medical Record Number: [id number] Patient Account Number: [id number] Date of Birth/Sex: Treating RN: Apr 06, 1946 (76 y.o. Melonie [name] Primary Care Anarosa Kubisiak: [name] Other Clinician: Referring Jahsiah Carpenter: Treating Saisha Hogue/Extender: [name] in Treatment: 27 Active Problems Location of Pain Severity and Description of Pain Patient Has Paino No Site Locations Pain Management and Medication Current Pain  Management: Electronic Signature(s) Signed: 06/08/2023 4:21:14 PM By: [name] Pax RN Entered By: [name] on 06/09/2023 14:41:19 [name], [name] ([id number]) [id number] Page 7 of 9 -------------------------------------------------------------------------------- Patient/Caregiver Education Details Patient Name: Date of Service: [name] Gillermina Hu 7/29/2024andnbsp2:30 PM Medical Record Number: [id number] Patient Account Number: [id number] Date of Birth/Gender: Treating RN: 26-Aug-1946 (76 y.o. [name]) [name] Primary Care Physician: [name] Other Clinician: Referring Physician: Treating Physician/Extender: [name] in Treatment: 27 Education Assessment Education Provided To: Patient Education Topics Provided Wound/Skin Impairment: Handouts: Caring for Your Ulcer Methods: Explain/Verbal Responses: State content correctly Electronic Signature(s) Signed: 05/14/2023 4:21:14 PM By: [name] Pax RN Entered By: [name] on 06/09/2023 15:01:31 -------------------------------------------------------------------------------- Wound Assessment Details Patient Name: Date of Service: [name], [name] 06/09/2023 2:30 PM Medical Record Number: [id number] Patient Account Number: [id number] Date of Birth/Sex: Treating RN: [birth year] (76 y.o. Melonie [name] Primary Care Harbor Paster: [name] Other Clinician: Referring Liya Strollo: Treating Daivon Rayos/Extender: [name] in Treatment: 27 Wound Status  Wound Number: 5 Primary Diabetic Wound/Ulcer of the Lower Extremity Etiology: Wound Location: Left Calcaneus Secondary Pressure Ulcer Wounding Event: Pressure Injury Etiology: Date Acquired: 10/20/2022 Wound Open Weeks Of Treatment: 27 Status: Clustered Wound: No Comorbid Coronary Artery Disease, Hypertension, Type I Diabetes, History: Osteoarthritis, Received  Chemotherapy Photos [name], [name] ([id number]) [id number] Page 8 of 9 Wound Measurements Length: (cm) 1 Width: (cm) 1 Depth: (cm) 1.2 Area: (cm) 0.785 Volume: (cm) 0.942 % Reduction in Area: 83.3% % Reduction in Volume: -100% Epithelialization: None Tunneling: No Undermining: No Wound Description Classification: Grade 2 Exudate Amount: Medium Exudate Type: Serosanguineous Exudate Color: red, brown Foul Odor After Cleansing: No Slough/Fibrino Yes Wound Bed Granulation Amount: Large (67-100%) Exposed Structure Granulation Quality: Red Fat Layer (Subcutaneous Tissue) Exposed: Yes Necrotic Amount: Small (1-33%) Necrotic Quality: Adherent Slough Treatment Notes Wound #5 (Calcaneus) Wound Laterality: Left Cleanser Byram Ancillary Kit - 15 Day Supply Discharge Instruction: Use supplies as instructed; Kit contains: (15) Saline Bullets; (15) 3x3 Gauze; 15 pr Gloves Vashe 5.8 (oz) Discharge Instruction: Use vashe 5.8 (oz) as directed Peri-Wound Care Topical Primary Dressing Hydrofera Blue Ready Transfer Foam, 2.5x2.5 (in/in) Discharge Instruction: PACK IN WOUND Apply Hydrofera Blue Ready to wound bed as directed Secondary Dressing ABD Pad 5x9 (in/in) Discharge Instruction: Cover with ABD pad Secured With Tubigrip Size D, 3x10 (in/yd) Discharge Instruction: single layer Compression Wrap Compression Stockings Add-Ons Electronic Signature(s) Signed: 06/10/2023 4:21:14 PM By: [name] Pax RN Entered By: [name] on 06/09/2023 14:55:47 [name], [name] ([id number]) [id number] Page 9 of 9 -------------------------------------------------------------------------------- Vitals Details Patient Name: Date of Service: [name], [name] 06/09/2023 2:30 PM Medical Record Number: [id number] Patient Account Number: [id number] Date of Birth/Sex: Treating RN: 03-19-1946 (76 y.o. [name]) [name] Primary Care  Cree Kunert: [name] Other Clinician: Referring Athol Bolds: Treating Rolondo Pierre/Extender: [name] in Treatment: 27 Vital Signs Time Taken: 14:40 Temperature (F): 97.8 Pulse (bpm): 108 Respiratory Rate (breaths/min): 18 Blood Pressure (mmHg): 117/71 Reference Range: 80 - 120 mg / dl Electronic Signature(s) Signed: 05/17/2023 4:21:14 PM By: [name] Pax RN Entered By: [name] on 06/09/2023 14:41:00

## 2023-06-12 NOTE — Progress Notes (Signed)
  Chaplain On-Call responded to a call from ED Unit Secretary Drinda Butts, who reported that CPR was in progress in room 16. Chaplain arrived in the ED and received update from RN Meghan that the patient died.  Chaplain met that patient's partner [name] at bedside, as well as Patient Advocate Lurena Joiner. Chaplain provided much spiritual and emotional support as [name] grieved openly. His grief is compounded by recalling the death of his mother last 10/21/23.  Chaplain also went to [facility] Consult Room to support three of [name]'s family members who were there:  2 sisters 1 brother-in-law  Chaplain offered spiritual and emotional support for them, and went back and forth maintaining a compassionate presence with them and [name]  Chaplain Evelena Peat M.Div., [facility]

## 2023-06-12 NOTE — Code Documentation (Signed)
Patient time of death occurred at 35

## 2023-06-12 NOTE — ED Triage Notes (Addendum)
Pt was was on toilet from home, cardiac arrest @0742 . CPR started by family , EMS PEA @0807 , Pt got ROSC @0850 , lost pulse , CPR resumed, ROSC achieved 2x more times. Cpr in progress

## 2023-06-12 NOTE — ED Provider Notes (Signed)
[facility] Provider Note    Event Date/Time   First MD Initiated Contact with Patient 05/30/2023 1014     (approximate)   History   Cardiac Arrest   HPI  [name] is a 77 y.o. male presents to the emergency department with cardiac arrest.  Patient was in the bathroom today and initially partner believed that he was having a seizure and then noted that he did not have any pulses.  Family member started CPR.  When EMS arrived they initiated CPR with multiple rounds of epinephrine.  Initial rhythm was PEA.  2 rounds of ROSC at 850.  CPR was resumed and then again ROSC was achieved 2 more times.  CPR in progress on arrival.  Initially had hyperglycemia this morning and received 1 unit of insulin.  Large skin tear to the right side.  IO was in place.     Physical Exam   Triage Vital Signs: ED Triage Vitals [05/18/2023 1004]  Encounter Vitals Group     BP      Systolic BP Percentile      Diastolic BP Percentile      Pulse Rate (!) 160     Resp      Temp      Temp src      SpO2 99 %     Weight      Height      Head Circumference      Peak Flow      Pain Score      Pain Loc      Pain Education      Exclude from Growth Chart     Most recent vital signs: Vitals:   05/12/2023 1004  Pulse: (!) 160  SpO2: 99%    Physical Exam Constitutional:      Appearance: He is well-developed. He is ill-appearing.     Comments: Active CPR with the [facility] device  HENT:     Head: Atraumatic.  Eyes:     Conjunctiva/sclera: Conjunctivae normal.     Comments: Pupils 2 mm and nonreactive  Cardiovascular:     Rate and Rhythm: Tachycardia present.  Pulmonary:     Comments: Supraglottic device in place. Musculoskeletal:     Comments: Significant pitting edema to bilateral upper and lower extremities.  Large skin tear to the right upper extremity.  IO in place to the right lower leg.  Neurological:     Comments: Nonresponsive with a GCS of 3       IMPRESSION / MDM / ASSESSMENT AND PLAN / ED COURSE  I reviewed the triage vital signs and the nursing notes.  On arrival patient actively receiving CPR.  Patient initially had cardiac arrest at 73.  Multiple rounds of ROSC, on an epi drip and received 17 rounds of epinephrine.  Patient initially had good end-tidal CO2 however after the last round of CPR end-tidal CO2 has dropped.  On pulse check patient had PEA.  Given epinephrine.  Given 1 dose of calcium.  Multiple attempts of an IV that was unsuccessful.  Significant amount of edema.  Continued CPR, on next pulse check continued to have PEA with no pulse.  Patient has undergone CPR for greater than 2 hours at this time -continues to be in PEA.  Bedside cardiac ultrasound without pericardial effusion.  No significant cardiac activity.  Unfortunately unable to achieve ROSC, time of death 10:08 AM.  Consulted medical examiner.  Notified patient's partner [name] and other family  members.  Labs (all labs ordered are listed, but only abnormal results are displayed) Labs interpreted as -    Labs Reviewed  CBG MONITORING, ED - Abnormal; Notable for the following components:      Result Value   Glucose-Capillary 271 (*)    All other components within normal limits    Notified primary care physician office to inform them of patient's death.   Patient's primary care should be able to fill out the death certificate     PROCEDURES:  Critical Care performed: yes  .Critical Care  Performed by: Corena Herter, MD Authorized by: Corena Herter, MD   Critical care provider statement:    Critical care time (minutes):  30   Critical care time was exclusive of:  Separately billable procedures and treating other patients   Critical care was necessary to treat or prevent imminent or life-threatening deterioration of the following conditions:  Cardiac failure   Critical care was time spent personally by me on the following activities:   Development of treatment plan with patient or surrogate, discussions with consultants, evaluation of patient's response to treatment, examination of patient, ordering and review of laboratory studies, ordering and review of radiographic studies, ordering and performing treatments and interventions, pulse oximetry, re-evaluation of patient's condition and review of old charts CPR  Date/Time: 06/10/2023 10:33 AM  Performed by: Corena Herter, MD Authorized by: Corena Herter, MD  CPR Procedure Details:    ACLS/BLS initiated by EMS: Yes     CPR/ACLS performed in the ED: Yes     Duration of CPR (minutes):  6   Outcome: Pt declared dead    CPR performed via ACLS guidelines under my direct supervision.  See RN documentation for details including defibrillator use, medications, doses and timing. Comments:     Received 120 minutes of CPR with EMS prior to arrival with 2 rounds of ROSC.  Continued to be in PEA rhythm.   Patient's presentation is most consistent with acute presentation with potential threat to life or bodily function.   MEDICATIONS ORDERED IN ED: Medications  calcium gluconate inj 10% (1 g) URGENT USE ONLY! (1 g Intravenous Given 06/03/2023 1007)  EPINEPHrine (ADRENALIN) 1 mg (1 mg Intravenous Given 06/10/2023 1002)    FINAL CLINICAL IMPRESSION(S) / ED DIAGNOSES   Final diagnoses:  Cardiac arrest [facility])     Rx / DC Orders   ED Discharge Orders     None        Note:  This document was prepared using Dragon voice recognition software and may include unintentional dictation errors.   Corena Herter, MD 05/22/2023 1035

## 2023-06-12 NOTE — Code Documentation (Signed)
MD with [place] with minimal cardiac squeeze

## 2023-06-12 DEATH — deceased

## 2023-06-17 ENCOUNTER — Ambulatory Visit: Payer: Medicare HMO | Admitting: Physician Assistant

## 2023-06-24 ENCOUNTER — Ambulatory Visit: Payer: Medicare HMO | Admitting: Infectious Diseases

## 2023-06-25 ENCOUNTER — Ambulatory Visit: Payer: Self-pay | Admitting: Urology

## 2023-06-26 MED FILL — Medication: Qty: 1 | Status: AC

## 2023-07-11 ENCOUNTER — Telehealth: Payer: Self-pay | Admitting: Infectious Diseases

## 2023-07-11 NOTE — Telephone Encounter (Signed)
Pt deceased [name], patient's partner had called about the results of the disc/bone biopsy - pathology neg for osteo, or malignancy- culture neg for bacteria or fungus- shared the results with him

## 2023-11-06 NOTE — Telephone Encounter (Signed)
cancel
